# Patient Record
Sex: Female | Born: 1935
Health system: Southern US, Community
[De-identification: ages and names within clinical notes are randomized; demographics above are authoritative.]

## PROBLEM LIST (undated history)

## (undated) ENCOUNTER — Ambulatory Visit: Disposition: A | Discharge status: Home / Self Care | Payer: Medicare PPO

## (undated) ENCOUNTER — Ambulatory Visit: Admission: EM | Payer: Medicare PPO | Source: Home / Self Care

## (undated) DIAGNOSIS — G629 Polyneuropathy, unspecified: Secondary | ICD-10-CM

## (undated) DIAGNOSIS — R109 Unspecified abdominal pain: Secondary | ICD-10-CM

## (undated) DIAGNOSIS — E890 Postprocedural hypothyroidism: Secondary | ICD-10-CM

## (undated) DIAGNOSIS — E119 Type 2 diabetes mellitus without complications: Secondary | ICD-10-CM

## (undated) DIAGNOSIS — M7989 Other specified soft tissue disorders: Secondary | ICD-10-CM

## (undated) DIAGNOSIS — J449 Chronic obstructive pulmonary disease, unspecified: Secondary | ICD-10-CM

## (undated) DIAGNOSIS — F32A Depression, unspecified: Secondary | ICD-10-CM

## (undated) DIAGNOSIS — K5900 Constipation, unspecified: Secondary | ICD-10-CM

## (undated) DIAGNOSIS — I82409 Acute embolism and thrombosis of unspecified deep veins of unspecified lower extremity: Secondary | ICD-10-CM

## (undated) DIAGNOSIS — I10 Essential (primary) hypertension: Secondary | ICD-10-CM

## (undated) DIAGNOSIS — T7840XA Allergy, unspecified, initial encounter: Secondary | ICD-10-CM

## (undated) DIAGNOSIS — H53149 Visual discomfort, unspecified: Secondary | ICD-10-CM

## (undated) DIAGNOSIS — H269 Unspecified cataract: Secondary | ICD-10-CM

## (undated) DIAGNOSIS — K219 Gastro-esophageal reflux disease without esophagitis: Secondary | ICD-10-CM

## (undated) DIAGNOSIS — Z87448 Personal history of other diseases of urinary system: Secondary | ICD-10-CM

## (undated) DIAGNOSIS — E079 Disorder of thyroid, unspecified: Secondary | ICD-10-CM

## (undated) DIAGNOSIS — I2510 Atherosclerotic heart disease of native coronary artery without angina pectoris: Secondary | ICD-10-CM

## (undated) DIAGNOSIS — N3090 Cystitis, unspecified without hematuria: Secondary | ICD-10-CM

## (undated) DIAGNOSIS — N393 Stress incontinence (female) (male): Secondary | ICD-10-CM

## (undated) DIAGNOSIS — H532 Diplopia: Secondary | ICD-10-CM

## (undated) DIAGNOSIS — R499 Unspecified voice and resonance disorder: Secondary | ICD-10-CM

## (undated) DIAGNOSIS — I272 Pulmonary hypertension, unspecified: Secondary | ICD-10-CM

## (undated) DIAGNOSIS — F329 Major depressive disorder, single episode, unspecified: Secondary | ICD-10-CM

## (undated) DIAGNOSIS — R350 Frequency of micturition: Secondary | ICD-10-CM

## (undated) DIAGNOSIS — I2699 Other pulmonary embolism without acute cor pulmonale: Secondary | ICD-10-CM

## (undated) DIAGNOSIS — R10A0 Flank pain, unspecified side: Secondary | ICD-10-CM

## (undated) DIAGNOSIS — H5711 Ocular pain, right eye: Secondary | ICD-10-CM

## (undated) DIAGNOSIS — J45909 Unspecified asthma, uncomplicated: Secondary | ICD-10-CM

## (undated) DIAGNOSIS — J3489 Other specified disorders of nose and nasal sinuses: Secondary | ICD-10-CM

## (undated) DIAGNOSIS — N3946 Mixed incontinence: Secondary | ICD-10-CM

## (undated) DIAGNOSIS — N390 Urinary tract infection, site not specified: Secondary | ICD-10-CM

## (undated) DIAGNOSIS — M8080XA Other osteoporosis with current pathological fracture, unspecified site, initial encounter for fracture: Secondary | ICD-10-CM

## (undated) DIAGNOSIS — K760 Fatty (change of) liver, not elsewhere classified: Secondary | ICD-10-CM

## (undated) DIAGNOSIS — M1990 Unspecified osteoarthritis, unspecified site: Secondary | ICD-10-CM

## (undated) DIAGNOSIS — W19XXXA Unspecified fall, initial encounter: Secondary | ICD-10-CM

## (undated) DIAGNOSIS — G500 Trigeminal neuralgia: Secondary | ICD-10-CM

## (undated) DIAGNOSIS — Z7409 Other reduced mobility: Secondary | ICD-10-CM

## (undated) DIAGNOSIS — H9190 Unspecified hearing loss, unspecified ear: Secondary | ICD-10-CM

## (undated) DIAGNOSIS — C4490 Unspecified malignant neoplasm of skin, unspecified: Secondary | ICD-10-CM

## (undated) DIAGNOSIS — R197 Diarrhea, unspecified: Secondary | ICD-10-CM

## (undated) DIAGNOSIS — D333 Benign neoplasm of cranial nerves: Secondary | ICD-10-CM

## (undated) DIAGNOSIS — Z789 Other specified health status: Secondary | ICD-10-CM

## (undated) DIAGNOSIS — R600 Localized edema: Secondary | ICD-10-CM

## (undated) DIAGNOSIS — S22000A Wedge compression fracture of unspecified thoracic vertebra, initial encounter for closed fracture: Secondary | ICD-10-CM

## (undated) DIAGNOSIS — I519 Heart disease, unspecified: Secondary | ICD-10-CM

## (undated) DIAGNOSIS — G459 Transient cerebral ischemic attack, unspecified: Secondary | ICD-10-CM

## (undated) DIAGNOSIS — E785 Hyperlipidemia, unspecified: Secondary | ICD-10-CM

## (undated) DIAGNOSIS — R32 Unspecified urinary incontinence: Secondary | ICD-10-CM

## (undated) DIAGNOSIS — I639 Cerebral infarction, unspecified: Secondary | ICD-10-CM

## (undated) HISTORY — DX: Unspecified asthma, uncomplicated: J45.909

## (undated) HISTORY — PX: IVC FILTER PLACEMENT (ARMC HX): HXRAD1551

## (undated) HISTORY — DX: Other specified health status: Z78.9

## (undated) HISTORY — DX: Chronic obstructive pulmonary disease, unspecified: J44.9

## (undated) HISTORY — DX: Other specified soft tissue disorders: M79.89

## (undated) HISTORY — PX: PUBOVAGINAL SLING: SHX1035

## (undated) HISTORY — DX: Disorder of thyroid, unspecified: E07.9

## (undated) HISTORY — DX: Urinary tract infection, site not specified: N39.0

## (undated) HISTORY — DX: Unspecified voice and resonance disorder: R49.9

## (undated) HISTORY — PX: OTHER SURGICAL HISTORY: SHX169

## (undated) HISTORY — DX: Essential (primary) hypertension: I10

## (undated) HISTORY — DX: Stress incontinence (female) (male): N39.3

## (undated) HISTORY — PX: BREAST SURGERY: SHX581

## (undated) HISTORY — DX: Depression, unspecified: F32.A

## (undated) HISTORY — DX: Unspecified malignant neoplasm of skin, unspecified: C44.90

## (undated) HISTORY — DX: Visual discomfort, unspecified: H53.149

## (undated) HISTORY — DX: Unspecified abdominal pain: R10.9

## (undated) HISTORY — DX: Hyperlipidemia, unspecified: E78.5

## (undated) HISTORY — PX: THROAT SURGERY: SHX803

## (undated) HISTORY — DX: Acute embolism and thrombosis of unspecified deep veins of unspecified lower extremity: I82.409

## (undated) HISTORY — DX: Heart disease, unspecified: I51.9

## (undated) HISTORY — PX: LAPAROSCOPIC TUBAL LIGATION: SUR803

## (undated) HISTORY — DX: Unspecified osteoarthritis, unspecified site: M19.90

## (undated) HISTORY — DX: Mixed incontinence: N39.46

## (undated) HISTORY — DX: Fatty (change of) liver, not elsewhere classified: K76.0

## (undated) HISTORY — DX: Gastro-esophageal reflux disease without esophagitis: K21.9

## (undated) HISTORY — DX: Transient cerebral ischemic attack, unspecified: G45.9

## (undated) HISTORY — DX: Ocular pain, right eye: H57.11

## (undated) HISTORY — DX: Polyneuropathy, unspecified: G62.9

## (undated) HISTORY — PX: APPENDECTOMY: SHX54

## (undated) HISTORY — DX: Other reduced mobility: Z74.09

## (undated) HISTORY — DX: Other specified disorders of nose and nasal sinuses: J34.89

## (undated) HISTORY — DX: Unspecified hearing loss, unspecified ear: H91.90

## (undated) HISTORY — DX: Diplopia: H53.2

## (undated) HISTORY — PX: CATARACT EXTRACTION: SUR2

## (undated) HISTORY — DX: Type 2 diabetes mellitus without complications: E11.9

## (undated) HISTORY — DX: Cerebral infarction, unspecified: I63.9

## (undated) HISTORY — PX: MOHS SURGERY: SUR867

## (undated) HISTORY — DX: Constipation, unspecified: K59.00

## (undated) HISTORY — PX: EYE SURGERY: SHX253

## (undated) HISTORY — DX: Unspecified fall, initial encounter: W19.XXXA

## (undated) HISTORY — DX: Pulmonary hypertension, unspecified: I27.20

## (undated) HISTORY — DX: Postprocedural hypothyroidism: E89.0

## (undated) HISTORY — DX: Other osteoporosis with current pathological fracture, unspecified site, initial encounter for fracture: M80.80XA

## (undated) HISTORY — DX: Personal history of other diseases of urinary system: Z87.448

## (undated) HISTORY — PX: BRAIN SURGERY: SHX531

## (undated) HISTORY — DX: Frequency of micturition: R35.0

## (undated) HISTORY — DX: Flank pain, unspecified side: R10.A0

## (undated) HISTORY — PX: CHOLECYSTECTOMY: SHX55

## (undated) HISTORY — DX: Wedge compression fracture of unspecified thoracic vertebra, initial encounter for closed fracture: S22.000A

## (undated) HISTORY — DX: Atherosclerotic heart disease of native coronary artery without angina pectoris: I25.10

## (undated) HISTORY — PX: TOOTH EXTRACTION: SUR596

## (undated) HISTORY — PX: THYROID SURGERY: SHX805

## (undated) HISTORY — DX: Other pulmonary embolism without acute cor pulmonale: I26.99

## (undated) HISTORY — DX: Diarrhea, unspecified: R19.7

## (undated) HISTORY — DX: Allergy, unspecified, initial encounter: T78.40XA

## (undated) HISTORY — DX: Cystitis, unspecified without hematuria: N30.90

## (undated) HISTORY — DX: Unspecified cataract: H26.9

## (undated) HISTORY — DX: Unspecified urinary incontinence: R32

## (undated) HISTORY — DX: Benign neoplasm of cranial nerves: D33.3

## (undated) HISTORY — DX: Major depressive disorder, single episode, unspecified: F32.9

## (undated) HISTORY — DX: Localized edema: R60.0

## (undated) HISTORY — DX: Trigeminal neuralgia: G50.0

---

## 1974-12-04 HISTORY — PX: TOTAL THYROIDECTOMY: SHX2547

## 2004-09-26 ENCOUNTER — Encounter: Payer: Self-pay | Admitting: Obstetrics and Gynecology

## 2004-10-04 ENCOUNTER — Encounter: Payer: Self-pay | Admitting: Obstetrics and Gynecology

## 2004-11-14 ENCOUNTER — Ambulatory Visit: Payer: Self-pay | Admitting: Family Medicine

## 2005-01-20 ENCOUNTER — Ambulatory Visit: Payer: Self-pay | Admitting: Internal Medicine

## 2005-02-03 ENCOUNTER — Ambulatory Visit: Payer: Self-pay | Admitting: Otolaryngology

## 2005-02-27 ENCOUNTER — Inpatient Hospital Stay: Payer: Self-pay | Admitting: Internal Medicine

## 2005-03-09 ENCOUNTER — Ambulatory Visit: Payer: Self-pay | Admitting: Otolaryngology

## 2005-06-05 ENCOUNTER — Ambulatory Visit: Payer: Self-pay | Admitting: Internal Medicine

## 2005-06-26 ENCOUNTER — Ambulatory Visit: Payer: Self-pay | Admitting: Internal Medicine

## 2005-07-18 ENCOUNTER — Ambulatory Visit: Payer: Self-pay | Admitting: Otolaryngology

## 2005-07-27 ENCOUNTER — Ambulatory Visit: Payer: Self-pay | Admitting: Otolaryngology

## 2005-08-30 ENCOUNTER — Ambulatory Visit: Payer: Self-pay | Admitting: Internal Medicine

## 2005-11-22 ENCOUNTER — Other Ambulatory Visit: Payer: Self-pay

## 2005-11-22 ENCOUNTER — Ambulatory Visit: Payer: Self-pay | Admitting: Otolaryngology

## 2005-12-01 ENCOUNTER — Ambulatory Visit: Payer: Self-pay | Admitting: Otolaryngology

## 2005-12-14 ENCOUNTER — Ambulatory Visit: Payer: Self-pay | Admitting: Internal Medicine

## 2006-01-22 ENCOUNTER — Encounter: Payer: Self-pay | Admitting: Urology

## 2006-02-01 ENCOUNTER — Encounter: Payer: Self-pay | Admitting: Urology

## 2006-03-04 ENCOUNTER — Encounter: Payer: Self-pay | Admitting: Urology

## 2006-04-03 ENCOUNTER — Encounter: Payer: Self-pay | Admitting: Urology

## 2006-12-27 ENCOUNTER — Ambulatory Visit: Payer: Self-pay | Admitting: Internal Medicine

## 2007-10-18 ENCOUNTER — Ambulatory Visit: Payer: Self-pay | Admitting: Cardiovascular Disease

## 2007-11-12 ENCOUNTER — Ambulatory Visit: Payer: Self-pay | Admitting: Urology

## 2007-12-09 ENCOUNTER — Ambulatory Visit: Payer: Self-pay | Admitting: Internal Medicine

## 2008-01-07 ENCOUNTER — Ambulatory Visit: Payer: Self-pay | Admitting: Internal Medicine

## 2008-06-24 ENCOUNTER — Ambulatory Visit: Payer: Self-pay | Admitting: Ophthalmology

## 2009-01-19 ENCOUNTER — Ambulatory Visit: Payer: Self-pay | Admitting: Internal Medicine

## 2009-05-24 ENCOUNTER — Emergency Department: Payer: Self-pay | Admitting: Emergency Medicine

## 2009-06-26 ENCOUNTER — Inpatient Hospital Stay: Payer: Self-pay | Admitting: Internal Medicine

## 2009-08-13 ENCOUNTER — Emergency Department: Payer: Self-pay | Admitting: Emergency Medicine

## 2009-08-28 ENCOUNTER — Ambulatory Visit: Payer: Self-pay | Admitting: Neurosurgery

## 2010-05-04 ENCOUNTER — Ambulatory Visit: Payer: Self-pay | Admitting: Internal Medicine

## 2010-08-11 ENCOUNTER — Ambulatory Visit: Payer: Self-pay | Admitting: Urology

## 2010-08-23 ENCOUNTER — Ambulatory Visit: Payer: Self-pay | Admitting: Urology

## 2011-09-13 DIAGNOSIS — C44310 Basal cell carcinoma of skin of unspecified parts of face: Secondary | ICD-10-CM | POA: Insufficient documentation

## 2011-10-03 ENCOUNTER — Ambulatory Visit: Payer: Self-pay | Admitting: Internal Medicine

## 2013-02-11 DIAGNOSIS — R351 Nocturia: Secondary | ICD-10-CM | POA: Insufficient documentation

## 2013-02-11 DIAGNOSIS — R339 Retention of urine, unspecified: Secondary | ICD-10-CM | POA: Insufficient documentation

## 2013-02-11 DIAGNOSIS — N3946 Mixed incontinence: Secondary | ICD-10-CM | POA: Insufficient documentation

## 2013-02-11 DIAGNOSIS — R350 Frequency of micturition: Secondary | ICD-10-CM | POA: Insufficient documentation

## 2013-02-11 DIAGNOSIS — N393 Stress incontinence (female) (male): Secondary | ICD-10-CM | POA: Insufficient documentation

## 2013-02-11 DIAGNOSIS — N3642 Intrinsic sphincter deficiency (ISD): Secondary | ICD-10-CM | POA: Insufficient documentation

## 2013-02-11 DIAGNOSIS — N3020 Other chronic cystitis without hematuria: Secondary | ICD-10-CM | POA: Insufficient documentation

## 2013-08-05 DIAGNOSIS — G500 Trigeminal neuralgia: Secondary | ICD-10-CM | POA: Insufficient documentation

## 2014-02-12 ENCOUNTER — Encounter: Payer: Self-pay | Admitting: Podiatry

## 2014-02-13 ENCOUNTER — Ambulatory Visit: Payer: Self-pay | Admitting: Internal Medicine

## 2014-02-16 ENCOUNTER — Ambulatory Visit (INDEPENDENT_AMBULATORY_CARE_PROVIDER_SITE_OTHER): Payer: Medicare PPO | Admitting: Podiatry

## 2014-02-16 ENCOUNTER — Ambulatory Visit (INDEPENDENT_AMBULATORY_CARE_PROVIDER_SITE_OTHER): Payer: Medicare PPO

## 2014-02-16 ENCOUNTER — Encounter: Payer: Self-pay | Admitting: Podiatry

## 2014-02-16 VITALS — BP 137/69 | HR 88 | Resp 26 | Ht 69.0 in | Wt 215.0 lb

## 2014-02-16 DIAGNOSIS — M79672 Pain in left foot: Principal | ICD-10-CM

## 2014-02-16 DIAGNOSIS — M79671 Pain in right foot: Secondary | ICD-10-CM

## 2014-02-16 DIAGNOSIS — B351 Tinea unguium: Secondary | ICD-10-CM

## 2014-02-16 DIAGNOSIS — M2040 Other hammer toe(s) (acquired), unspecified foot: Secondary | ICD-10-CM

## 2014-02-16 DIAGNOSIS — E1149 Type 2 diabetes mellitus with other diabetic neurological complication: Secondary | ICD-10-CM

## 2014-02-16 DIAGNOSIS — M79609 Pain in unspecified limb: Secondary | ICD-10-CM

## 2014-02-16 NOTE — Progress Notes (Signed)
   Subjective:    Patient ID: Jessica Erickson, female    DOB: 06-Jan-1926, 81 y.o.   MRN: [id number]  HPI Comments: The toenails are bothering me , they are thick and i cant cut them. Also callus on my feet.  Diabetic for 5 years. Blood sugar this morning was 145      Review of Systems  HENT:       Sinus problems Hearing loss   Eyes: Positive for itching.  Gastrointestinal: Positive for constipation.  Endocrine:       Diabetic   Hematological: Bruises/bleeds easily.  All other systems reviewed and are negative.       Objective:   Physical Exam: I reviewed her past history medications allergies surgeries and social history. Pulses are palpable bilateral neurologic sensorium is intact bilateral deep tendon reflexes are elicitable muscle strength is 5 over 5 dorsiflexors plantar flexors of his inverters and evertors. All neurovascular status is appears to be intact orthopedic evaluation demonstrates mild hammertoe deformities and HAV deformities bilateral. Cutaneous evaluation demonstrates supple well hydrated cutis nails are thick yellow dystrophic lytic mycotic painful palpation as well as debridement.        Assessment & Plan:  Assessment: Pain in limb secondary to onychomycosis 1 through 5 bilateral.  Plan: Debridement of nails 1 through 5 bilateral is cover service secondary to pain also debrided reactive hyperkeratosis.

## 2014-04-06 DIAGNOSIS — C4441 Basal cell carcinoma of skin of scalp and neck: Secondary | ICD-10-CM | POA: Insufficient documentation

## 2014-05-20 ENCOUNTER — Ambulatory Visit (INDEPENDENT_AMBULATORY_CARE_PROVIDER_SITE_OTHER): Payer: Medicare PPO | Admitting: Podiatry

## 2014-05-20 ENCOUNTER — Encounter: Payer: Self-pay | Admitting: Podiatry

## 2014-05-20 DIAGNOSIS — M79609 Pain in unspecified limb: Secondary | ICD-10-CM

## 2014-05-20 DIAGNOSIS — B351 Tinea unguium: Secondary | ICD-10-CM

## 2014-05-20 NOTE — Progress Notes (Signed)
She presents today with a chief complaint of ingrown toenail N. thick painful nails bilateral.  Objective: Vital signs are stable she is alert and oriented x3. Nails are thick yellow dystrophic with mycotic and painful palpation was sharp incurvated nail margins.  Assessment: Pain in limb secondary to onychomycosis 1 through 5 bilateral.  Plan: Remove nails 1 through 5 bilateral covered service secondary to pain

## 2014-08-19 ENCOUNTER — Ambulatory Visit (INDEPENDENT_AMBULATORY_CARE_PROVIDER_SITE_OTHER): Payer: Medicare PPO | Admitting: Podiatry

## 2014-08-19 ENCOUNTER — Encounter: Payer: Self-pay | Admitting: Podiatry

## 2014-08-19 DIAGNOSIS — M79676 Pain in unspecified toe(s): Secondary | ICD-10-CM

## 2014-08-19 DIAGNOSIS — B351 Tinea unguium: Secondary | ICD-10-CM

## 2014-08-19 DIAGNOSIS — M79609 Pain in unspecified limb: Secondary | ICD-10-CM

## 2014-08-19 NOTE — Progress Notes (Signed)
She presents today with a chief complaint of painful elongated toenails one through 5 bilateral.  Objective: pulses are palpable bilateral. Nails are thick yellow dystrophic clinically mycotic and painful palpation.  Assessment: Pain in limb secondary to onychomycosis 1 through 5 bilateral.  Plan: Pain in limb secondary to onychomycosis 1 through 5 bilateral.

## 2014-11-18 ENCOUNTER — Ambulatory Visit: Payer: Medicare PPO | Admitting: Podiatry

## 2014-12-09 ENCOUNTER — Ambulatory Visit: Payer: Medicare PPO | Admitting: Podiatry

## 2014-12-21 ENCOUNTER — Ambulatory Visit (INDEPENDENT_AMBULATORY_CARE_PROVIDER_SITE_OTHER): Payer: Medicare PPO | Admitting: Podiatry

## 2014-12-21 DIAGNOSIS — M79676 Pain in unspecified toe(s): Secondary | ICD-10-CM

## 2014-12-21 DIAGNOSIS — B351 Tinea unguium: Secondary | ICD-10-CM

## 2014-12-21 NOTE — Progress Notes (Signed)
She presents today with a chief complaint of painful elongated toenails one through 5 bilateral.  Objective: pulses are palpable bilateral. Nails are thick yellow dystrophic clinically mycotic and painful palpation.  Assessment: Pain in limb secondary to onychomycosis 1 through 5 bilateral.  Plan: Pain in limb secondary to onychomycosis 1 through 5 bilateral. R hallux pinch callus also reduced.

## 2015-03-22 ENCOUNTER — Ambulatory Visit: Payer: Medicare PPO

## 2015-03-24 ENCOUNTER — Ambulatory Visit (INDEPENDENT_AMBULATORY_CARE_PROVIDER_SITE_OTHER): Payer: Medicare PPO | Admitting: Podiatry

## 2015-03-24 ENCOUNTER — Encounter: Payer: Self-pay | Admitting: Podiatry

## 2015-03-24 DIAGNOSIS — M79676 Pain in unspecified toe(s): Secondary | ICD-10-CM | POA: Diagnosis not present

## 2015-03-24 DIAGNOSIS — B351 Tinea unguium: Secondary | ICD-10-CM | POA: Diagnosis not present

## 2015-03-24 NOTE — Progress Notes (Signed)
She presents today with a chief complaint of painful elongated toenails one through 5 bilateral.  Objective: pulses are palpable bilateral. Nails are thick yellow dystrophic clinically mycotic and painful palpation.  Assessment: Pain in limb secondary to onychomycosis 1 through 5 bilateral.  Plan: Pain in limb secondary to onychomycosis 1 through 5 bilateral. R hallux pinch callus also reduced.

## 2015-03-24 NOTE — Patient Instructions (Signed)
Diabetes and Foot Care Diabetes may cause you to have problems because of poor blood supply (circulation) to your feet and legs. This may cause the skin on your feet to become thinner, break easier, and heal more slowly. Your skin may become dry, and the skin may peel and crack. You may also have nerve damage in your legs and feet causing decreased feeling in them. You may not notice minor injuries to your feet that could lead to infections or more serious problems. Taking care of your feet is one of the most important things you can do for yourself.  HOME CARE INSTRUCTIONS  Wear shoes at all times, even in the house. Do not go barefoot. Bare feet are easily injured.  Check your feet daily for blisters, cuts, and redness. If you cannot see the bottom of your feet, use a mirror or ask someone for help.  Wash your feet with warm water (do not use hot water) and mild soap. Then pat your feet and the areas between your toes until they are completely dry. Do not soak your feet as this can dry your skin.  Apply a moisturizing lotion or petroleum jelly (that does not contain alcohol and is unscented) to the skin on your feet and to dry, brittle toenails. Do not apply lotion between your toes.  Trim your toenails straight across. Do not dig under them or around the cuticle. File the edges of your nails with an emery board or nail file.  Do not cut corns or calluses or try to remove them with medicine.  Wear clean socks or stockings every day. Make sure they are not too tight. Do not wear knee-high stockings since they may decrease blood flow to your legs.  Wear shoes that fit properly and have enough cushioning. To break in new shoes, wear them for just a few hours a day. This prevents you from injuring your feet. Always look in your shoes before you put them on to be sure there are no objects inside.  Do not cross your legs. This may decrease the blood flow to your feet.  If you find a minor scrape,  cut, or break in the skin on your feet, keep it and the skin around it clean and dry. These areas may be cleansed with mild soap and water. Do not cleanse the area with peroxide, alcohol, or iodine.  When you remove an adhesive bandage, be sure not to damage the skin around it.  If you have a wound, look at it several times a day to make sure it is healing.  Do not use heating pads or hot water bottles. They may burn your skin. If you have lost feeling in your feet or legs, you may not know it is happening until it is too late.  Make sure your health care provider performs a complete foot exam at least annually or more often if you have foot problems. Report any cuts, sores, or bruises to your health care provider immediately. SEEK MEDICAL CARE IF:   You have an injury that is not healing.  You have cuts or breaks in the skin.  You have an ingrown nail.  You notice redness on your legs or feet.  You feel burning or tingling in your legs or feet.  You have pain or cramps in your legs and feet.  Your legs or feet are numb.  Your feet always feel cold. SEEK IMMEDIATE MEDICAL CARE IF:   There is increasing redness,   swelling, or pain in or around a wound.  There is a red line that goes up your leg.  Pus is coming from a wound.  You develop a fever or as directed by your health care provider.  You notice a bad smell coming from an ulcer or wound. Document Released: 11/17/2000 Document Revised: 07/23/2013 Document Reviewed: 04/29/2013 ExitCare Patient Information 2015 [employer]. This information is not intended to replace advice given to you by your health care provider. Make sure you discuss any questions you have with your health care provider.  

## 2015-07-12 ENCOUNTER — Telehealth: Payer: Self-pay | Admitting: *Deleted

## 2015-07-12 NOTE — Telephone Encounter (Signed)
Pt states she would like to change her appt.

## 2015-07-19 ENCOUNTER — Ambulatory Visit: Payer: Medicare PPO

## 2015-08-23 ENCOUNTER — Ambulatory Visit (INDEPENDENT_AMBULATORY_CARE_PROVIDER_SITE_OTHER): Payer: Medicare PPO | Admitting: Podiatry

## 2015-08-23 DIAGNOSIS — B351 Tinea unguium: Secondary | ICD-10-CM

## 2015-08-23 DIAGNOSIS — M79676 Pain in unspecified toe(s): Secondary | ICD-10-CM | POA: Diagnosis not present

## 2015-08-23 NOTE — Progress Notes (Signed)
She presents today with a chief complaint of painful elongated toenails one through 5 bilateral.  Objective: pulses are palpable bilateral. Nails are thick yellow dystrophic clinically mycotic and painful palpation.  Assessment: Pain in limb secondary to onychomycosis 1 through 5 bilateral.  Plan: Pain in limb secondary to onychomycosis 1 through 5 bilateral. R hallux pinch callus also reduced.  Roselind Messier DPM

## 2015-09-06 ENCOUNTER — Ambulatory Visit: Payer: Medicare PPO

## 2015-09-15 ENCOUNTER — Emergency Department: Payer: Medicare PPO

## 2015-09-15 ENCOUNTER — Encounter: Payer: Self-pay | Admitting: *Deleted

## 2015-09-15 ENCOUNTER — Emergency Department
Admission: EM | Admit: 2015-09-15 | Discharge: 2015-09-15 | Disposition: A | Discharge status: Home / Self Care | Payer: Medicare PPO | Source: Home / Self Care | Attending: Emergency Medicine | Admitting: Emergency Medicine

## 2015-09-15 ENCOUNTER — Inpatient Hospital Stay
Admission: EM | Admit: 2015-09-15 | Discharge: 2015-09-18 | DRG: 392 | Payer: Medicare PPO | Attending: Internal Medicine | Admitting: Internal Medicine

## 2015-09-15 DIAGNOSIS — J45909 Unspecified asthma, uncomplicated: Secondary | ICD-10-CM | POA: Diagnosis present

## 2015-09-15 DIAGNOSIS — E039 Hypothyroidism, unspecified: Secondary | ICD-10-CM | POA: Diagnosis present

## 2015-09-15 DIAGNOSIS — R0902 Hypoxemia: Secondary | ICD-10-CM | POA: Diagnosis present

## 2015-09-15 DIAGNOSIS — W19XXXA Unspecified fall, initial encounter: Secondary | ICD-10-CM | POA: Diagnosis present

## 2015-09-15 DIAGNOSIS — R112 Nausea with vomiting, unspecified: Secondary | ICD-10-CM | POA: Diagnosis not present

## 2015-09-15 DIAGNOSIS — Z9889 Other specified postprocedural states: Secondary | ICD-10-CM

## 2015-09-15 DIAGNOSIS — Z880 Allergy status to penicillin: Secondary | ICD-10-CM

## 2015-09-15 DIAGNOSIS — Z87891 Personal history of nicotine dependence: Secondary | ICD-10-CM

## 2015-09-15 DIAGNOSIS — R2681 Unsteadiness on feet: Secondary | ICD-10-CM | POA: Diagnosis present

## 2015-09-15 DIAGNOSIS — I10 Essential (primary) hypertension: Secondary | ICD-10-CM | POA: Diagnosis present

## 2015-09-15 DIAGNOSIS — R52 Pain, unspecified: Secondary | ICD-10-CM

## 2015-09-15 DIAGNOSIS — T421X5A Adverse effect of iminostilbenes, initial encounter: Secondary | ICD-10-CM | POA: Diagnosis present

## 2015-09-15 DIAGNOSIS — Z833 Family history of diabetes mellitus: Secondary | ICD-10-CM

## 2015-09-15 DIAGNOSIS — R609 Edema, unspecified: Secondary | ICD-10-CM | POA: Diagnosis present

## 2015-09-15 DIAGNOSIS — N950 Postmenopausal bleeding: Secondary | ICD-10-CM | POA: Diagnosis present

## 2015-09-15 DIAGNOSIS — E1140 Type 2 diabetes mellitus with diabetic neuropathy, unspecified: Secondary | ICD-10-CM | POA: Diagnosis present

## 2015-09-15 DIAGNOSIS — Z79899 Other long term (current) drug therapy: Secondary | ICD-10-CM

## 2015-09-15 DIAGNOSIS — Z888 Allergy status to other drugs, medicaments and biological substances status: Secondary | ICD-10-CM

## 2015-09-15 DIAGNOSIS — K8689 Other specified diseases of pancreas: Secondary | ICD-10-CM | POA: Diagnosis present

## 2015-09-15 DIAGNOSIS — R531 Weakness: Secondary | ICD-10-CM

## 2015-09-15 DIAGNOSIS — Z882 Allergy status to sulfonamides status: Secondary | ICD-10-CM

## 2015-09-15 DIAGNOSIS — M545 Low back pain: Secondary | ICD-10-CM | POA: Diagnosis present

## 2015-09-15 DIAGNOSIS — J449 Chronic obstructive pulmonary disease, unspecified: Secondary | ICD-10-CM | POA: Diagnosis present

## 2015-09-15 DIAGNOSIS — T889XXA Complication of surgical and medical care, unspecified, initial encounter: Secondary | ICD-10-CM

## 2015-09-15 DIAGNOSIS — G500 Trigeminal neuralgia: Secondary | ICD-10-CM | POA: Diagnosis present

## 2015-09-15 DIAGNOSIS — M549 Dorsalgia, unspecified: Secondary | ICD-10-CM

## 2015-09-15 DIAGNOSIS — K5900 Constipation, unspecified: Secondary | ICD-10-CM | POA: Diagnosis present

## 2015-09-15 DIAGNOSIS — Z9049 Acquired absence of other specified parts of digestive tract: Secondary | ICD-10-CM

## 2015-09-15 DIAGNOSIS — E785 Hyperlipidemia, unspecified: Secondary | ICD-10-CM | POA: Diagnosis present

## 2015-09-15 DIAGNOSIS — Z7982 Long term (current) use of aspirin: Secondary | ICD-10-CM

## 2015-09-15 DIAGNOSIS — R1110 Vomiting, unspecified: Secondary | ICD-10-CM | POA: Diagnosis present

## 2015-09-15 DIAGNOSIS — M1990 Unspecified osteoarthritis, unspecified site: Secondary | ICD-10-CM | POA: Diagnosis present

## 2015-09-15 LAB — COMPREHENSIVE METABOLIC PANEL
ALT: 28 U/L (ref 14–54)
AST: 35 U/L (ref 15–41)
Albumin: 3.9 g/dL (ref 3.5–5.0)
Alkaline Phosphatase: 64 U/L (ref 38–126)
Anion gap: 8 (ref 5–15)
BUN: 16 mg/dL (ref 6–20)
CO2: 28 mmol/L (ref 22–32)
Calcium: 8.9 mg/dL (ref 8.9–10.3)
Chloride: 104 mmol/L (ref 101–111)
Creatinine, Ser: 0.77 mg/dL (ref 0.44–1.00)
GFR calc Af Amer: >60 mL/min (ref >60)
GFR calc non Af Amer: >60 mL/min (ref >60)
Glucose, Bld: 148 mg/dL — ABNORMAL HIGH (ref 65–99)
Potassium: 4.4 mmol/L (ref 3.5–5.1)
Sodium: 140 mmol/L (ref 135–145)
Total Bilirubin: 0.3 mg/dL (ref 0.3–1.2)
Total Protein: 7.1 g/dL (ref 6.5–8.1)

## 2015-09-15 LAB — CBC WITH DIFFERENTIAL/PLATELET
Basophils Absolute: 0 10*3/uL (ref 0–0.1)
Basophils Absolute: 0 10*3/uL (ref 0–0.1)
Basophils Relative: 0 %
Basophils Relative: 0 %
Eosinophils Absolute: 0.1 10*3/uL (ref 0–0.7)
Eosinophils Absolute: 0 10*3/uL (ref 0–0.7)
Eosinophils Relative: 1 %
Eosinophils Relative: 0 %
HCT: 40.8 % (ref 35.0–47.0)
HCT: 41.5 % (ref 35.0–47.0)
Hemoglobin: 13.2 g/dL (ref 12.0–16.0)
Hemoglobin: 13.7 g/dL (ref 12.0–16.0)
Lymphocytes Relative: 11 %
Lymphocytes Relative: 13 %
Lymphs Abs: 1.3 10*3/uL (ref 1.0–3.6)
Lymphs Abs: 1.6 10*3/uL (ref 1.0–3.6)
MCH: 30.2 pg (ref 26.0–34.0)
MCH: 30.8 pg (ref 26.0–34.0)
MCHC: 32.4 g/dL (ref 32.0–36.0)
MCHC: 33 g/dL (ref 32.0–36.0)
MCV: 93.2 fL (ref 80.0–100.0)
MCV: 93.4 fL (ref 80.0–100.0)
Monocytes Absolute: 0.7 10*3/uL (ref 0.2–0.9)
Monocytes Absolute: 0.8 10*3/uL (ref 0.2–0.9)
Monocytes Relative: 6 %
Monocytes Relative: 6 %
Neutro Abs: 9.2 10*3/uL — ABNORMAL HIGH (ref 1.4–6.5)
Neutro Abs: 10 10*3/uL — ABNORMAL HIGH (ref 1.4–6.5)
Neutrophils Relative %: 82 %
Neutrophils Relative %: 81 %
Platelets: 208 10*3/uL (ref 150–440)
Platelets: 227 10*3/uL (ref 150–440)
RBC: 4.38 MIL/uL (ref 3.80–5.20)
RBC: 4.44 MIL/uL (ref 3.80–5.20)
RDW: 14 % (ref 11.5–14.5)
RDW: 14.2 % (ref 11.5–14.5)
WBC: 11.3 10*3/uL — ABNORMAL HIGH (ref 3.6–11.0)
WBC: 12.4 10*3/uL — ABNORMAL HIGH (ref 3.6–11.0)

## 2015-09-15 LAB — BASIC METABOLIC PANEL
Anion gap: 5 (ref 5–15)
BUN: 17 mg/dL (ref 6–20)
CO2: 29 mmol/L (ref 22–32)
Calcium: 8.7 mg/dL — ABNORMAL LOW (ref 8.9–10.3)
Chloride: 106 mmol/L (ref 101–111)
Creatinine, Ser: 0.87 mg/dL (ref 0.44–1.00)
GFR calc Af Amer: >60 mL/min (ref >60)
GFR calc non Af Amer: 58 mL/min — ABNORMAL LOW (ref >60)
Glucose, Bld: 121 mg/dL — ABNORMAL HIGH (ref 65–99)
Potassium: 4.2 mmol/L (ref 3.5–5.1)
Sodium: 140 mmol/L (ref 135–145)

## 2015-09-15 LAB — URINALYSIS COMPLETE WITH MICROSCOPIC (ARMC ONLY)
Bilirubin Urine: NEGATIVE
Glucose, UA: NEGATIVE mg/dL
Hgb urine dipstick: NEGATIVE
Ketones, ur: NEGATIVE mg/dL
Leukocytes, UA: NEGATIVE
Nitrite: NEGATIVE
Protein, ur: NEGATIVE mg/dL
Specific Gravity, Urine: 1.018 (ref 1.005–1.030)
pH: 5 (ref 5.0–8.0)

## 2015-09-15 MED ORDER — ONDANSETRON HCL 4 MG/2ML IJ SOLN
4.0000 mg | Freq: Once | INTRAMUSCULAR | Status: AC
  Administered 2015-09-15: 4 mg via INTRAVENOUS
  Filled 2015-09-15: qty 2

## 2015-09-15 MED ORDER — OXYCODONE HCL 5 MG PO TABS: 2.5000 mg | ORAL_TABLET | Freq: Four times a day (QID) | ORAL | Status: DC | PRN

## 2015-09-15 MED ORDER — ACETAMINOPHEN 325 MG PO TABS
650.0000 mg | ORAL_TABLET | Freq: Once | ORAL | Status: AC
  Administered 2015-09-15: 650 mg via ORAL
  Filled 2015-09-15: qty 2

## 2015-09-15 MED ORDER — SODIUM CHLORIDE 0.9 % IV BOLUS (SEPSIS)
1000.0000 mL | Freq: Once | INTRAVENOUS | Status: AC
  Administered 2015-09-15: 1000 mL via INTRAVENOUS

## 2015-09-15 MED ORDER — ONDANSETRON HCL 4 MG/2ML IJ SOLN
4.0000 mg | Freq: Once | INTRAMUSCULAR | Status: AC
  Administered 2015-09-16: 4 mg via INTRAVENOUS
  Filled 2015-09-15: qty 2

## 2015-09-15 MED ORDER — IOHEXOL 240 MG/ML SOLN
25.0000 mL | Freq: Once | INTRAMUSCULAR | Status: AC | PRN
  Administered 2015-09-15: 25 mL via ORAL

## 2015-09-15 MED ORDER — ACETAMINOPHEN 325 MG PO TABS: 650.0000 mg | ORAL_TABLET | Freq: Four times a day (QID) | ORAL | Status: DC | PRN

## 2015-09-15 MED ORDER — IOHEXOL 300 MG/ML  SOLN
100.0000 mL | Freq: Once | INTRAMUSCULAR | Status: AC | PRN
  Administered 2015-09-15: 100 mL via INTRAVENOUS

## 2015-09-15 MED ORDER — MORPHINE SULFATE (PF) 4 MG/ML IV SOLN
4.0000 mg | Freq: Once | INTRAVENOUS | Status: AC
  Administered 2015-09-15: 4 mg via INTRAVENOUS
  Filled 2015-09-15: qty 1

## 2015-09-15 NOTE — Care Management Note (Signed)
Case Management Note  Patient Details  Name: Jessica Erickson MRN: [id number] Date of Birth: 1926-08-18  Subjective/Objective:    SPoke to Vinnie Level at [employer] [facility]- for PT referral.[phone]. Information for caresouth on discharge chart. Told pt. To expect a call from them. They say they can start tomorrow. Fax [phone]  Dr. Joni Fears made aware.               Action/Plan:   Expected Discharge Date:                  Expected Discharge Plan:     In-House Referral:     Discharge planning Services     Post Acute Care Choice:    Choice offered to:     DME Arranged:    DME Agency:     HH Arranged:    [place] Agency:     Status of Service:     Medicare Important Message Given:    Date Medicare IM Given:    Medicare IM give by:    Date Additional Medicare IM Given:    Additional Medicare Important Message give by:     If discussed at [place] of Stay Meetings, dates discussed:    Additional Comments:  Beau Fanny, RN 09/15/2015, 1:52 PM

## 2015-09-15 NOTE — ED Provider Notes (Signed)
-----------------------------------------   11:48 PM on 09/15/2015 -----------------------------------------  CT scan IMPRESSION: Dilated intra and extrahepatic bile ducts as well as pancreatic duct. This may be physiologic but correlation with liver enzymes is suggested. If positive, consider follow-up with elective MRI/ MRCP. Mild right renal hydronephrosis and hydroureter to the mid ureteral region. No obstructing stone or lesion identified but occult stone or stricture not excluded. Redundant stool-filled colon suggesting constipation. Calcification in the bladder neck may be a stone or dystrophic calcification.  Patient's LFTs are normal.will however add on a lipase. Additionally will attempt to walk the patient with a walker and get an O2 sat. Patient continues to have nausea. Will write for another dose of Zofran.  Nance Pear, MD 09/16/15 [phone]

## 2015-09-15 NOTE — ED Notes (Signed)
Pt fell this AM, c/o left posterior rib pain, was able to get herself back into bed, son called EMS.. Denies LOC.

## 2015-09-15 NOTE — ED Notes (Signed)
Patient transported to CT via stretcher.

## 2015-09-15 NOTE — ED Notes (Signed)
Pt was seen here early this morning from a fall and was discharged home.  Pt has vomiting since discharge.   Pt brought in by son today.  Pt reports she hurts all over.  Pt pale.

## 2015-09-15 NOTE — ED Notes (Signed)
MD at bedside. 

## 2015-09-15 NOTE — Discharge Instructions (Signed)
Take Tylenol every 6 hours for the next 5 days. In addition, if you have inadequate pain control, take oxycodone 2.5 mg as needed. Do not take this medicine more often than every 4 hours. We have arranged for home physical therapy who will come to evaluate you tomorrow.  Follow-up with your primary care doctor and interventional radiology regarding other ways of managing your trigeminal neuralgia. You appear to be having side effects from your increasing doses of carbamazepine which is causing you to be weak and fall down and at high risk for serious injury.  Neuropathic Pain Neuropathic pain is pain caused by damage to the nerves that are responsible for certain sensations in your body (sensory nerves). The pain can be caused by damage to:   The sensory nerves that send signals to your spinal cord and brain (peripheral nervous system).  The sensory nerves in your brain or spinal cord (central nervous system). Neuropathic pain can make you more sensitive to pain. What would be a minor sensation for most people may feel very painful if you have neuropathic pain. This is usually a long-term condition that can be difficult to treat. The type of pain can differ from person to person. It may start suddenly (acute), or it may develop slowly and last for a long time (chronic). Neuropathic pain may come and go as damaged nerves heal or may stay at the same level for years. It often causes emotional distress, loss of sleep, and a lower quality of life. CAUSES  The most common cause of damage to a sensory nerve is diabetes. Many other diseases and conditions can also cause neuropathic pain. Causes of neuropathic pain can be classified as:  Toxic. Many drugs and chemicals can cause toxic damage. The most common cause of toxic neuropathic pain is damage from drug treatment for cancer (chemotherapy).  Metabolic. This type of pain can happen when a disease causes imbalances that damage nerves. Diabetes is the most  common of these diseases. Vitamin B deficiency caused by long-term alcohol abuse is another common cause.  Traumatic. Any injury that cuts, crushes, or stretches a nerve can cause damage and pain. A common example is feeling pain after losing an arm or leg (phantom limb pain).  Compression-related. If a sensory nerve gets trapped or compressed for a long period of time, the blood supply to the nerve can be cut off.  Vascular. Many blood vessel diseases can cause neuropathic pain by decreasing blood supply and oxygen to nerves.  Autoimmune. This type of pain results from diseases in which the body's defense system mistakenly attacks sensory nerves. Examples of autoimmune diseases that can cause neuropathic pain include lupus and multiple sclerosis.  Infectious. Many types of viral infections can damage sensory nerves and cause pain. Shingles infection is a common cause of this type of pain.  Inherited. Neuropathic pain can be a symptom of many diseases that are passed down through families (genetic). SIGNS AND SYMPTOMS  The main symptom is pain. Neuropathic pain is often described as:  Burning.  Shock-like.  Stinging.  Hot or cold.  Itching. DIAGNOSIS  No single test can diagnose neuropathic pain. Your health care provider will do a physical exam and ask you about your pain. You may use a pain scale to describe how bad your pain is. You may also have tests to see if you have a high sensitivity to pain and to help find the cause and location of any sensory nerve damage. These tests may include:  Imaging studies, such as:  X-rays.  CT scan.  MRI.  Nerve conduction studies to test how well nerve signals travel through your sensory nerves (electrodiagnostic testing).  Stimulating your sensory nerves through electrodes on your skin and measuring the response in your spinal cord and brain (somatosensory evoked potentials). TREATMENT  Treatment for neuropathic pain may change over  time. You may need to try different treatment options or a combination of treatments. Some options include:  Over-the-counter pain relievers.  Prescription medicines. Some medicines used to treat other conditions may also help neuropathic pain. These include medicines to:  Control seizures (anticonvulsants).  Relieve depression (antidepressants).  Prescription-strength pain relievers (narcotics). These are usually used when other pain relievers do not help.  Transcutaneous nerve stimulation (TENS). This uses electrical currents to block painful nerve signals. The treatment is painless.  Topical and local anesthetics. These are medicines that numb the nerves. They can be injected as a nerve block or applied to the skin.  Alternative treatments, such as:  Acupuncture.  Meditation.  Massage.  Physical therapy.  Pain management programs.  Counseling. HOME CARE INSTRUCTIONS  Learn as much as you can about your condition.  Take medicines only as directed by your health care provider.  Work closely with all your health care providers to find what works best for you.  Have a good support system at home.  Consider joining a chronic pain support group. SEEK MEDICAL CARE IF:  Your pain treatments are not helping.  You are having side effects from your medicines.  You are struggling with fatigue, mood changes, depression, or anxiety.   This information is not intended to replace advice given to you by your health care provider. Make sure you discuss any questions you have with your health care provider.   Document Released: 08/17/2004 Document Revised: 12/11/2014 Document Reviewed: 04/30/2014 Elsevier Interactive Patient Education [employer].

## 2015-09-15 NOTE — ED Notes (Signed)
Spoke with Reita Cliche, MD regarding presenting c/o. MD with [facility]ORB for: PIV, Zofran 4mg  IVP, Morphine 4mg  IVP, NS x 1 liter bolus, and a contrast CT of abd/pelvis. Orders to be entered and carried by nursing staff.

## 2015-09-15 NOTE — ED Provider Notes (Signed)
[facility] Emergency Department Provider Note  ____________________________________________  Time seen: 11:05 AM on arrival by EMS  I have reviewed the triage vital signs and the nursing notes.   HISTORY  Chief Complaint Fall    HPI MABREY QUARANTA is a 81 y.o. female brought to the ED by EMS after a fall. She was trying to get out of bed when she felt very weak and suddenly felt like her legs gave out and so she fell down to the ground. It is more of a slide onto her back, and she currently denies any acute pain. No pain in the hips or legs. She denies any chest pain or shortness of breath, no abdominal pain headache or other acute symptoms.  She has a history of trigeminal neuralgia and takes Tegretol for prevention. Recently she is been also having some other there is headache symptoms and has been anxious that her trigeminal neuralgia would come back, so she's been increasing her Tegretol doses. In discussion with her son this appears to be associated with increased weakness and difficulty with ambulation in the house. She's lost 60 pounds over the past year due to decreased exercise and decreased activity.     Past Medical History  Diagnosis Date  . History of kidney problems   . Diabetes ([place])   . Asthma   . Osteoarthritis   . Bilateral swelling of feet     and legs  . Neuropathy ([place])      There are no active problems to display for this patient.    Past Surgical History  Procedure Laterality Date  . Brain tumor surgery    . Thyroid surgery    . Throat surgery    . Cholecystectomy       Current Outpatient Rx  Name  Route  Sig  Dispense  Refill  . acetaminophen (TYLENOL) 325 MG tablet   Oral   Take 2 tablets (650 mg total) by mouth every 6 (six) hours as needed.   60 tablet   0   . ASPIRIN LOW DOSE 81 MG EC tablet               . atorvastatin (LIPITOR) 40 MG tablet   Oral   Take 40 mg by mouth daily.         Marland Kitchen CALCIUM  CITRATE PO   Oral   Take by mouth.         . carbamazepine (TEGRETOL XR) 200 MG 12 hr tablet   Oral   Take 200 mg by mouth 3 (three) times daily.         . cloNIDine (CATAPRES) 0.1 MG tablet   Oral   Take 0.1 mg by mouth.         . Diphenhydramine-APAP, sleep, (TYLENOL PM EXTRA STRENGTH PO)   Oral   Take by mouth at bedtime.         . DULoxetine (CYMBALTA) 30 MG capsule   Oral   Take 30 mg by mouth daily.         . Esomeprazole Magnesium (NEXIUM PO)   Oral   Take by mouth 2 (two) times daily.         . fosinopril (MONOPRIL) 10 MG tablet   Oral   Take 10 mg by mouth daily.         Marland Kitchen levothyroxine (LEVOXYL) 150 MCG tablet   Oral   Take 150 mcg by mouth daily before breakfast.         .  metFORMIN (GLUCOPHAGE) 500 MG tablet   Oral   Take by mouth 2 (two) times daily with a meal.         . Multiple Vitamin (MULTIVITAMIN) tablet   Oral   Take 1 tablet by mouth daily.         . nitrofurantoin, macrocrystal-monohydrate, (MACROBID) 100 MG capsule   Oral   Take 100 mg by mouth at bedtime.         Marland Kitchen omeprazole (PRILOSEC) 40 MG capsule               . oxyCODONE (ROXICODONE) 5 MG immediate release tablet   Oral   Take 0.5 tablets (2.5 mg total) by mouth every 6 (six) hours as needed for breakthrough pain.   8 tablet   0   . venlafaxine (EFFEXOR) 75 MG tablet                  Allergies Aspirin; Penicillins; and Sulfa antibiotics   Family History  Problem Relation Age of Onset  . Diabetes Mother     Social History Social History  Substance Use Topics  . Smoking status: Never Smoker   . Smokeless tobacco: Never Used  . Alcohol Use: No    Review of Systems  Constitutional:   No fever or chills. No weight changes Eyes:   No blurry vision or double vision.  ENT:   No sore throat. Cardiovascular:   No chest pain. Respiratory:   No dyspnea or cough. Gastrointestinal:   Negative for abdominal pain, vomiting and diarrhea.  No  BRBPR or melena. Genitourinary:   Negative for dysuria, urinary retention, bloody urine, or difficulty urinating. Musculoskeletal:   Chronic back pain Skin:   Negative for rash. Neurological:   Negative for headaches, focal weakness or numbness. Chronic peripheral neuropathy Psychiatric:  No anxiety or depression.   Endocrine:  No hot/cold intolerance, changes in energy, or sleep difficulty.  10-point ROS otherwise negative.  ____________________________________________   PHYSICAL EXAM:  VITAL SIGNS: ED Triage Vitals  Enc Vitals Group     BP 09/15/15 1108 102/60 mmHg     Pulse Rate 09/15/15 1108 70     Resp 09/15/15 1113 18     Temp 09/15/15 1113 97.6 F (36.4 C)     Temp Source 09/15/15 1113 Oral     SpO2 09/15/15 1108 97 %     Weight 09/15/15 1108 180 lb (81.647 kg)     Height 09/15/15 1108 5\' 6"  (1.676 m)     Head Cir --      Peak Flow --      Pain Score 09/15/15 1110 5     Pain Loc --      Pain Edu? --      Excl. in [place]? --      Constitutional:   Alert and oriented. Well appearing and in no distress. Eyes:   No scleral icterus. No conjunctival pallor. PERRL. EOMI ENT   Head:   Normocephalic and atraumatic.   Nose:   No congestion/rhinnorhea. No septal hematoma   Mouth/Throat:   MMM, no pharyngeal erythema. No peritonsillar mass. No uvula shift.   Neck:   No stridor. No SubQ emphysema. No meningismus. Hematological/Lymphatic/Immunilogical:   No cervical lymphadenopathy. Cardiovascular:   RRR. Normal and symmetric distal pulses are present in all extremities. No murmurs, rubs, or gallops. Respiratory:   Normal respiratory effort without tachypnea nor retractions. Breath sounds are clear and equal bilaterally. No wheezes/rales/rhonchi. Gastrointestinal:   Soft and  nontender. No distention. There is no CVA tenderness.  No rebound, rigidity, or guarding. Genitourinary:   deferred Musculoskeletal:   Nontender with normal range of motion in all extremities.  No joint effusions.  No lower extremity tenderness.  No edema. Neurologic:   Normal speech and language.  CN 2-10 normal. Motor grossly intact. No gross focal neurologic deficits are appreciated.  Skin:    Skin is warm, dry and intact. No rash noted.  No petechiae, purpura, or bullae. Psychiatric:   Mood and affect are normal. Speech and behavior are normal. Patient exhibits appropriate insight and judgment.  ____________________________________________    LABS (pertinent positives/negatives) (all labs ordered are listed, but only abnormal results are displayed) Labs Reviewed  URINALYSIS COMPLETEWITH MICROSCOPIC ([facility] ONLY) - Abnormal; Notable for the following:    Color, Urine YELLOW (*)    APPearance CLEAR (*)    Bacteria, UA RARE (*)    Squamous Epithelial / LPF 0-5 (*)    All other components within normal limits  BASIC METABOLIC PANEL - Abnormal; Notable for the following:    Glucose, Bld 121 (*)    Calcium 8.7 (*)    GFR calc non Af Amer 58 (*)    All other components within normal limits  CBC WITH DIFFERENTIAL/PLATELET - Abnormal; Notable for the following:    WBC 11.3 (*)    Neutro Abs 9.2 (*)    All other components within normal limits   ____________________________________________   EKG  Interpreted by me Normal sinus rhythm rate of 65, normal axis and intervals. Poor R-wave progression in anterior precordial leads. Normal ST segments and T waves  ____________________________________________    RADIOLOGY  Chest x-ray unremarkable  ____________________________________________   PROCEDURES   ____________________________________________   INITIAL IMPRESSION / ASSESSMENT AND PLAN / ED COURSE  Pertinent labs & imaging results that were available during my care of the patient were reviewed by me and considered in my medical decision making (see chart for details).  Labs urinalysis chest x-ray EKG all unremarkable. Physical exam does not reveal any  concern for any acute traumatic injury. This appears to be generalized weakness due to Tegretol overuse and side effects, resulting in some deconditioning as well. We arranged for home physical therapy evaluation. The patient does have a very intelligent and involved family who can help with her care as well, and she has an exercise bike that she is used to using at home although she is decrease it recently. I encouraged her to increase her exercise as tolerated. Decrease the Tegretol from 3 times a day to 2 times a day only, and follow up with her interventional radiologist for consideration of further gamma knife treatments. I discussed with her and her son at length for about 10 minutes the need to find the right balance between suppression of her trigeminal neuralgia with Tegretol and avoiding side effects that are going to put her at high risk for serious injury     ____________________________________________   FINAL CLINICAL IMPRESSION(S) / ED DIAGNOSES  Final diagnoses:  Side effects of treatment, initial encounter   generalized weakness    Carrie Mew, MD 09/15/15 1420

## 2015-09-15 NOTE — ED Provider Notes (Signed)
[facility] Emergency Department Provider Note   ____________________________________________  Time seen:  I have reviewed the triage vital signs and the triage nursing note.  HISTORY  Chief Complaint Emesis   Historian Patient, son  HPI UHURA Jessica Erickson is a 80 y.o. female who is here for reevaluation due to pain all over, generalized weakness, and vomiting. Patient was seen earlier this afternoon between 9 and 3 in the emergency department. This morning she woke up and felt dizzy and had a fall, but does not report headache injury. Yesterday she had a normal day and felt well and was preparing for a bridge party. After she was evaluated here in the emergency department today, she was discharged home but since then she's had persistent and somewhat worsening pain "all over". When I ask her more specifically, it seems that the most of the pain is in her low back. She is also complaining about pain in both arms and both legs. Her son thought she was having pain in her right hip, however she has been walking. She is not running a fever. She did vomit once at home. She had not taken oxycodone, and they are unsure why she vomited. She does not have a habit of vomiting. She's not had any diarrhea. She's not had a cough or trouble breathing. Patient states she is too weak to walk around at this point in time.    Past Medical History  Diagnosis Date  . History of kidney problems   . Diabetes ([place])   . Asthma   . Osteoarthritis   . Bilateral swelling of feet     and legs  . Neuropathy ([place])     There are no active problems to display for this patient.   Past Surgical History  Procedure Laterality Date  . Brain tumor surgery    . Thyroid surgery    . Throat surgery    . Cholecystectomy      Current Outpatient Rx  Name  Route  Sig  Dispense  Refill  . acetaminophen (TYLENOL) 325 MG tablet   Oral   Take 2 tablets (650 mg total) by mouth every 6 (six) hours as  needed.   60 tablet   0   . ASPIRIN LOW DOSE 81 MG EC tablet               . atorvastatin (LIPITOR) 40 MG tablet   Oral   Take 40 mg by mouth daily.         Marland Kitchen CALCIUM CITRATE PO   Oral   Take by mouth.         . carbamazepine (TEGRETOL XR) 200 MG 12 hr tablet   Oral   Take 200 mg by mouth 3 (three) times daily.         . cloNIDine (CATAPRES) 0.1 MG tablet   Oral   Take 0.1 mg by mouth.         . Diphenhydramine-APAP, sleep, (TYLENOL PM EXTRA STRENGTH PO)   Oral   Take by mouth at bedtime.         . DULoxetine (CYMBALTA) 30 MG capsule   Oral   Take 30 mg by mouth daily.         . Esomeprazole Magnesium (NEXIUM PO)   Oral   Take by mouth 2 (two) times daily.         . fosinopril (MONOPRIL) 10 MG tablet   Oral   Take 10 mg by mouth  daily.         . levothyroxine (LEVOXYL) 150 MCG tablet   Oral   Take 150 mcg by mouth daily before breakfast.         . metFORMIN (GLUCOPHAGE) 500 MG tablet   Oral   Take by mouth 2 (two) times daily with a meal.         . Multiple Vitamin (MULTIVITAMIN) tablet   Oral   Take 1 tablet by mouth daily.         . nitrofurantoin, macrocrystal-monohydrate, (MACROBID) 100 MG capsule   Oral   Take 100 mg by mouth at bedtime.         Marland Kitchen omeprazole (PRILOSEC) 40 MG capsule               . oxyCODONE (ROXICODONE) 5 MG immediate release tablet   Oral   Take 0.5 tablets (2.5 mg total) by mouth every 6 (six) hours as needed for breakthrough pain.   8 tablet   0   . venlafaxine (EFFEXOR) 75 MG tablet                 Allergies Aspirin; Penicillins; and Sulfa antibiotics  Family History  Problem Relation Age of Onset  . Diabetes Mother     Social History Social History  Substance Use Topics  . Smoking status: Never Smoker   . Smokeless tobacco: Never Used  . Alcohol Use: No    Review of Systems  Constitutional: Negative for fever. Eyes: Negative for visual changes. ENT: Negative for sore  throat. Cardiovascular: Negative for chest pain or palpitations.Marland Kitchen Respiratory: Negative for shortness of breath. Gastrointestinal: denies focal abdominal pain, but does have vomiting. No diarrhea. Genitourinary: Negative for dysuria. Musculoskeletal: positive for back pain. Skin: Negative for rash. Neurological: Negative for headache. 10 point Review of Systems otherwise negative ____________________________________________   PHYSICAL EXAM:  VITAL SIGNS: ED Triage Vitals  Enc Vitals Group     BP 09/15/15 1835 138/77 mmHg     Pulse Rate 09/15/15 1835 76     Resp 09/15/15 1835 22     Temp 09/15/15 1835 97.9 F (36.6 C)     Temp Source 09/15/15 1835 Oral     SpO2 09/15/15 1835 99 %     Weight 09/15/15 1835 181 lb (82.101 kg)     Height 09/15/15 1835 5\' 9"  (1.753 m)     Head Cir --      Peak Flow --      Pain Score 09/15/15 1836 10     Pain Loc --      Pain Edu? --      Excl. in [place]? --      Constitutional: Alert and oriented. Squinting in "pain", however no acute distress. Eyes: Conjunctivae are normal. PERRL. Normal extraocular movements. ENT   Head: Normocephalic and atraumatic.   Nose: No congestion/rhinnorhea.   Mouth/Throat: Mucous membranes are mildly dry..   Neck: No stridor.no posterior midline tenderness. Cardiovascular/Chest: Normal rate, regular rhythm.  No murmurs, rubs, or gallops. Respiratory: Normal respiratory effort without tachypnea nor retractions. Breath sounds are clear and equal bilaterally. No wheezes/rales/rhonchi. Gastrointestinal: Soft. No distention, no guarding, no rebound. Mild tenderness. Obese.  Genitourinary/rectal:Deferred Musculoskeletal: mild tenderness to the muscles of the arms and legs, without bony tenderness specifically of the hip or pelvis. normal range of motion in all extremities. No joint effusions.   No edema. Neurologic:  Normal speech and language. No gross or focal neurologic deficits are appreciated. Skin:  Skin  is warm, dry and intact. No rash noted.   ____________________________________________   EKG I, Lisa Roca, MD, the attending physician have personally viewed and interpreted all ECGs.  No EKG performed ____________________________________________  LABS (pertinent positives/negatives)  White blood count 12.4, hemoglobin 13.7, platelet count 227 , Grandson metabolic panel without significant abnormality  ____________________________________________  RADIOLOGY All Xrays were viewed by me. Imaging interpreted by Radiologist.  CT scan abdomen and pelvis with contrast: Pending __________________________________________  PROCEDURES  Procedure(s) performed: None  Critical Care performed: None  ____________________________________________   ED COURSE / ASSESSMENT AND PLAN  CONSULTATIONS: None  Pertinent labs & imaging results that were available during my care of the patient were reviewed by me and considered in my medical decision making (see chart for details).  Patient back for reevaluation due to feeling generally weak all over with "pain all over. "She's also had an episode of emesis at home and again here in the emergency department. I'm somewhat suspicious that the reason why she may have fell this morning is due to potential viral illness.    Because of her elevated white blood cell count, and some abdominal pain, with significant back pain, I am cannot obtain at CT of the abdomen and pelvis with contrast. Family still concerned that her hips may be hurting her especially with the fall, however she has been walking and so I'm less suspicious that she has a pelvic or hip fracture, however this will be visualized on the CT as well.  Patient was given IV fluids as well as morphine and Zofran for symptoms here.  I reviewed patient's urinalysis from earlier today which is negative for urinary tract infection.  If the patient feels better within 20 treatment, and her CT  scan is reassuring, it may be reasonable to discharge patient home. Patient will need reevaluation to assess her situation.  Patient care transferred to Dr. Archie Balboa at 10 PM. Patient will likely be transferred to oncoming night physician at 11 PM.  Patient / Family / Caregiver informed of clinical course, medical decision-making process, and agree with plan.    ___________________________________________   FINAL CLINICAL IMPRESSION(S) / ED DIAGNOSES   Final diagnoses:  Generalized pain  Non-intractable vomiting with nausea, vomiting of unspecified type       Lisa Roca, MD 09/15/15 2148

## 2015-09-16 ENCOUNTER — Emergency Department: Payer: Medicare PPO

## 2015-09-16 DIAGNOSIS — Z79899 Other long term (current) drug therapy: Secondary | ICD-10-CM | POA: Diagnosis not present

## 2015-09-16 DIAGNOSIS — R609 Edema, unspecified: Secondary | ICD-10-CM | POA: Diagnosis present

## 2015-09-16 DIAGNOSIS — K8689 Other specified diseases of pancreas: Secondary | ICD-10-CM | POA: Diagnosis present

## 2015-09-16 DIAGNOSIS — M545 Low back pain: Secondary | ICD-10-CM | POA: Diagnosis present

## 2015-09-16 DIAGNOSIS — E785 Hyperlipidemia, unspecified: Secondary | ICD-10-CM | POA: Diagnosis present

## 2015-09-16 DIAGNOSIS — Z7982 Long term (current) use of aspirin: Secondary | ICD-10-CM | POA: Diagnosis not present

## 2015-09-16 DIAGNOSIS — T421X5A Adverse effect of iminostilbenes, initial encounter: Secondary | ICD-10-CM | POA: Diagnosis present

## 2015-09-16 DIAGNOSIS — Z880 Allergy status to penicillin: Secondary | ICD-10-CM | POA: Diagnosis not present

## 2015-09-16 DIAGNOSIS — Z833 Family history of diabetes mellitus: Secondary | ICD-10-CM | POA: Diagnosis not present

## 2015-09-16 DIAGNOSIS — I10 Essential (primary) hypertension: Secondary | ICD-10-CM | POA: Diagnosis present

## 2015-09-16 DIAGNOSIS — R2681 Unsteadiness on feet: Secondary | ICD-10-CM | POA: Diagnosis present

## 2015-09-16 DIAGNOSIS — Z882 Allergy status to sulfonamides status: Secondary | ICD-10-CM | POA: Diagnosis not present

## 2015-09-16 DIAGNOSIS — Z9049 Acquired absence of other specified parts of digestive tract: Secondary | ICD-10-CM | POA: Diagnosis not present

## 2015-09-16 DIAGNOSIS — E039 Hypothyroidism, unspecified: Secondary | ICD-10-CM | POA: Diagnosis present

## 2015-09-16 DIAGNOSIS — Z87891 Personal history of nicotine dependence: Secondary | ICD-10-CM | POA: Diagnosis not present

## 2015-09-16 DIAGNOSIS — W19XXXA Unspecified fall, initial encounter: Secondary | ICD-10-CM | POA: Diagnosis present

## 2015-09-16 DIAGNOSIS — R0902 Hypoxemia: Secondary | ICD-10-CM | POA: Diagnosis present

## 2015-09-16 DIAGNOSIS — E1140 Type 2 diabetes mellitus with diabetic neuropathy, unspecified: Secondary | ICD-10-CM | POA: Diagnosis present

## 2015-09-16 DIAGNOSIS — Z888 Allergy status to other drugs, medicaments and biological substances status: Secondary | ICD-10-CM | POA: Diagnosis not present

## 2015-09-16 DIAGNOSIS — J449 Chronic obstructive pulmonary disease, unspecified: Secondary | ICD-10-CM | POA: Diagnosis present

## 2015-09-16 DIAGNOSIS — M1990 Unspecified osteoarthritis, unspecified site: Secondary | ICD-10-CM | POA: Diagnosis present

## 2015-09-16 DIAGNOSIS — Z9889 Other specified postprocedural states: Secondary | ICD-10-CM | POA: Diagnosis not present

## 2015-09-16 DIAGNOSIS — R52 Pain, unspecified: Secondary | ICD-10-CM | POA: Diagnosis present

## 2015-09-16 DIAGNOSIS — K5900 Constipation, unspecified: Secondary | ICD-10-CM | POA: Diagnosis present

## 2015-09-16 DIAGNOSIS — N950 Postmenopausal bleeding: Secondary | ICD-10-CM | POA: Diagnosis present

## 2015-09-16 DIAGNOSIS — J45909 Unspecified asthma, uncomplicated: Secondary | ICD-10-CM | POA: Diagnosis present

## 2015-09-16 DIAGNOSIS — R112 Nausea with vomiting, unspecified: Secondary | ICD-10-CM | POA: Diagnosis present

## 2015-09-16 DIAGNOSIS — G500 Trigeminal neuralgia: Secondary | ICD-10-CM | POA: Diagnosis present

## 2015-09-16 DIAGNOSIS — R1110 Vomiting, unspecified: Secondary | ICD-10-CM | POA: Diagnosis present

## 2015-09-16 LAB — GLUCOSE, CAPILLARY
Glucose-Capillary: 108 mg/dL — ABNORMAL HIGH (ref 65–99)
Glucose-Capillary: 124 mg/dL — ABNORMAL HIGH (ref 65–99)
Glucose-Capillary: 100 mg/dL — ABNORMAL HIGH (ref 65–99)
Glucose-Capillary: 108 mg/dL — ABNORMAL HIGH (ref 65–99)

## 2015-09-16 LAB — LIPASE, BLOOD: Lipase: 26 U/L (ref 22–51)

## 2015-09-16 MED ORDER — LEVOTHYROXINE SODIUM 75 MCG PO TABS
150.0000 ug | ORAL_TABLET | Freq: Every day | ORAL | Status: DC
  Administered 2015-09-16 – 2015-09-18 (×3): 150 ug via ORAL
  Filled 2015-09-16 (×3): qty 2

## 2015-09-16 MED ORDER — ASPIRIN EC 81 MG PO TBEC
81.0000 mg | DELAYED_RELEASE_TABLET | Freq: Every day | ORAL | Status: DC
  Administered 2015-09-16 – 2015-09-18 (×3): 81 mg via ORAL
  Filled 2015-09-16 (×3): qty 1

## 2015-09-16 MED ORDER — ADULT MULTIVITAMIN W/MINERALS CH
1.0000 | ORAL_TABLET | Freq: Every day | ORAL | Status: DC
  Administered 2015-09-16 – 2015-09-18 (×3): 1 via ORAL
  Filled 2015-09-16 (×3): qty 1

## 2015-09-16 MED ORDER — ATORVASTATIN CALCIUM 20 MG PO TABS
40.0000 mg | ORAL_TABLET | Freq: Every day | ORAL | Status: DC
  Administered 2015-09-16 – 2015-09-17 (×2): 40 mg via ORAL
  Filled 2015-09-16 (×3): qty 2

## 2015-09-16 MED ORDER — ACETAMINOPHEN 325 MG PO TABS
650.0000 mg | ORAL_TABLET | Freq: Four times a day (QID) | ORAL | Status: DC | PRN
  Administered 2015-09-16 – 2015-09-17 (×6): 650 mg via ORAL
  Filled 2015-09-16 (×6): qty 2

## 2015-09-16 MED ORDER — SODIUM CHLORIDE 0.9 % IV SOLN
INTRAVENOUS | Status: DC
  Administered 2015-09-16 (×2): via INTRAVENOUS

## 2015-09-16 MED ORDER — CARBAMAZEPINE ER 200 MG PO TB12
200.0000 mg | ORAL_TABLET | Freq: Two times a day (BID) | ORAL | Status: DC
  Administered 2015-09-16 – 2015-09-18 (×6): 200 mg via ORAL
  Filled 2015-09-16 (×8): qty 1

## 2015-09-16 MED ORDER — ONDANSETRON HCL 4 MG PO TABS: 4.0000 mg | ORAL_TABLET | Freq: Four times a day (QID) | ORAL | Status: DC | PRN

## 2015-09-16 MED ORDER — FOSINOPRIL SODIUM 10 MG PO TABS
10.0000 mg | ORAL_TABLET | Freq: Every day | ORAL | Status: DC
  Filled 2015-09-16: qty 1

## 2015-09-16 MED ORDER — ENOXAPARIN SODIUM 40 MG/0.4ML ~~LOC~~ SOLN
40.0000 mg | Freq: Every day | SUBCUTANEOUS | Status: DC
  Administered 2015-09-16 – 2015-09-18 (×3): 40 mg via SUBCUTANEOUS
  Filled 2015-09-16 (×4): qty 0.4

## 2015-09-16 MED ORDER — LISINOPRIL 10 MG PO TABS
10.0000 mg | ORAL_TABLET | Freq: Every day | ORAL | Status: DC
  Administered 2015-09-16 – 2015-09-18 (×3): 10 mg via ORAL
  Filled 2015-09-16 (×3): qty 1

## 2015-09-16 MED ORDER — CLONIDINE HCL 0.1 MG PO TABS
0.1000 mg | ORAL_TABLET | Freq: Every day | ORAL | Status: DC
  Administered 2015-09-16 – 2015-09-18 (×3): 0.1 mg via ORAL
  Filled 2015-09-16 (×3): qty 1

## 2015-09-16 MED ORDER — INSULIN ASPART 100 UNIT/ML ~~LOC~~ SOLN
0.0000 [IU] | Freq: Three times a day (TID) | SUBCUTANEOUS | Status: DC
  Administered 2015-09-16 – 2015-09-18 (×4): 1 [IU] via SUBCUTANEOUS
  Filled 2015-09-16: qty 1
  Filled 2015-09-16: qty 2
  Filled 2015-09-16 (×2): qty 1

## 2015-09-16 MED ORDER — SENNOSIDES-DOCUSATE SODIUM 8.6-50 MG PO TABS: 1.0000 | ORAL_TABLET | Freq: Every evening | ORAL | Status: DC | PRN

## 2015-09-16 MED ORDER — MAGNESIUM HYDROXIDE 400 MG/5ML PO SUSP
30.0000 mL | Freq: Two times a day (BID) | ORAL | Status: AC
  Administered 2015-09-16 (×2): 30 mL via ORAL
  Filled 2015-09-16 (×2): qty 30

## 2015-09-16 MED ORDER — ONDANSETRON HCL 4 MG/2ML IJ SOLN
4.0000 mg | Freq: Four times a day (QID) | INTRAMUSCULAR | Status: DC | PRN
  Administered 2015-09-16 – 2015-09-17 (×4): 4 mg via INTRAVENOUS
  Filled 2015-09-16 (×4): qty 2

## 2015-09-16 MED ORDER — METFORMIN HCL 500 MG PO TABS
500.0000 mg | ORAL_TABLET | Freq: Two times a day (BID) | ORAL | Status: DC
  Administered 2015-09-18 (×2): 500 mg via ORAL
  Filled 2015-09-16 (×3): qty 1

## 2015-09-16 MED ORDER — OXYCODONE HCL 5 MG PO TABS
2.5000 mg | ORAL_TABLET | Freq: Four times a day (QID) | ORAL | Status: DC | PRN
  Administered 2015-09-16 – 2015-09-18 (×6): 2.5 mg via ORAL
  Filled 2015-09-16 (×6): qty 1

## 2015-09-16 NOTE — Evaluation (Signed)
Physical Therapy Evaluation Patient Details Name: Jessica Erickson MRN: [id number] DOB: [birth year] Today's Date: 09/16/2015   History of Present Illness  81 y.o. female with a known history of trigeminal neuralgia status post surgery 2 on, hypertension comes to the emergency room to second time today after she was found to have increasing nausea and vomiting. Patient was seen initially in the emergency room after a fall during the daytime. She was apparently taking increasing amount of Tegretol for her trigeminal neuralgia. According to the patient this was increased by her neurologist at [facility]. Patient had a fall, nontraumatic, evaluated at the emergency room arranged physical therapy and was sent home.  Clinical Impression  Upon evaluation, pt reports feeling less nauseous than she has in the last couple of days but is still feeling a significant amount of pain throughout her body. Pt demonstrated good sequencing of movements for bed mobility but did not possess the upper body/core strength required to help herself into full sitting on EOB. Pt required mod assist for supine<>sit tranfers for control of upper body and trunk. Pt reported feeling nauseous and dry heaved several times while sitting on the side of the bed. Pt did demonstrate motivation for performance of activity despite her pain and nausea. Pt was min assist for sit<>stand transfer and gait (both w/RW) ; pt walks at a very slow pace which places her at increased risk of falls. Pt requires continued skilled acute PT services in order to increase functional strength and capacity for functional mobility.     Follow Up Recommendations SNF    Equipment Recommendations  Rolling walker with 5" wheels    Recommendations for Other Services       Precautions / Restrictions Precautions Precautions: Fall Restrictions Weight Bearing Restrictions: No      Mobility  Bed Mobility Overal bed mobility: Needs Assistance Bed Mobility:  Supine to Sit;Sit to Supine     Supine to sit: Mod assist Sit to supine: Mod assist   General bed mobility comments: Pt requires assistance with control of trunk and shoulders for coming completely to supine; able to move BLEs out to edge of bed independently without cues for sequencing  Transfers Overall transfer level: Needs assistance Equipment used: Rolling walker (2 wheeled) Transfers: Sit to/from Stand Sit to Stand: Min assist            Ambulation/Gait Ambulation/Gait assistance: Min assist Ambulation Distance (Feet): 20 Feet Assistive device: Rolling walker (2 wheeled) Gait Pattern/deviations: Step-through pattern;Decreased step length - right;Decreased step length - left;Decreased stride length;Trunk flexed Gait velocity: decreased Gait velocity interpretation: <1.8 ft/sec, indicative of risk for recurrent falls    Stairs            Wheelchair Mobility    Modified Rankin (Stroke Patients Only)       Balance Overall balance assessment: No apparent balance deficits (not formally assessed)                                           Pertinent Vitals/Pain Pain Assessment: No/denies pain (pt states that she has pain "all over")    Home Living Family/patient expects to be discharged to:: Private residence Living Arrangements: Alone   Type of Home: Apartment Home Access: Level entry     Home Layout: One level Home Equipment: Cane - single point      Prior Function Level of Independence: Independent  with assistive device(s)         Comments: uses SPC for all ambulation in and outside of the home     Hand Dominance        Extremity/Trunk Assessment   Upper Extremity Assessment: Overall WFL for tasks assessed           Lower Extremity Assessment: Overall WFL for tasks assessed (gross BLE strength at least 3+/5)         Communication   Communication: HOH  Cognition Arousal/Alertness: Awake/alert Behavior During  Therapy: WFL for tasks assessed/performed Overall Cognitive Status: Within Functional Limits for tasks assessed                      General Comments      Exercises        Assessment/Plan    PT Assessment Patient needs continued PT services  PT Diagnosis Generalized weakness;Acute pain   PT Problem List Decreased strength;Decreased activity tolerance;Decreased mobility;Pain  PT Treatment Interventions DME instruction;Gait training;Functional mobility training;Therapeutic activities;Therapeutic exercise   PT Goals (Current goals can be found in the Care Plan section) Acute Rehab PT Goals Patient Stated Goal: to feel less nauseous and less pain PT Goal Formulation: With patient Time For Goal Achievement: 09/30/15 Potential to Achieve Goals: Good    Frequency Min 2X/week   Barriers to discharge        Co-evaluation               End of Session Equipment Utilized During Treatment: Gait belt Activity Tolerance: Patient tolerated treatment well;Patient limited by fatigue (pt limited by nausea and fatigue) Patient left: in bed;with call bell/phone within reach;with bed alarm set           Time: 1035-1100 PT Time Calculation (min) (ACUTE ONLY): 25 min   Charges:         PT G Codes:        Jessica Erickson,SPT 09/16/2015, 1:21 PM

## 2015-09-16 NOTE — ED Notes (Signed)
Pt's son, Jonae Coble, can be reached at [phone].

## 2015-09-16 NOTE — ED Notes (Addendum)
Pt ambulatory with assistance and walker. Pt with unsteady gait. Pt's O2 sats 84-87% RA while ambulating. Pt c/o generalized weakness and pain. Pt assisted back to bed. Pt incontinent of urine. Pt cleaned and clean, dry linens and incontinent pad applied. Pt AAOX4. NAD noted. RR even and nonlabored. Will continue to monitor.

## 2015-09-16 NOTE — Progress Notes (Signed)
[place] at [place] NAME: Jessica Erickson    MR#:  [id number]  DATE OF BIRTH:  07-28-1926  SUBJECTIVE:  CHIEF COMPLAINT:   Chief Complaint  Patient presents with  . Emesis   Patient presented here due to generalized weakness, nausea and vomiting. Son at bedside. Patient still feels nauseated but no vomiting noted on the hospital. No other complaints.  REVIEW OF SYSTEMS:    Review of Systems  Constitutional: Negative for fever and chills.  HENT: Negative for congestion and tinnitus.   Eyes: Negative for blurred vision and double vision.  Respiratory: Negative for cough, shortness of breath and wheezing.   Cardiovascular: Negative for chest pain, orthopnea and PND.  Gastrointestinal: Positive for nausea and vomiting. Negative for abdominal pain and diarrhea.  Genitourinary: Negative for dysuria and hematuria.  Neurological: Positive for weakness. Negative for dizziness, sensory change and focal weakness.  All other systems reviewed and are negative.   Nutrition: Heart healthy Tolerating Diet: Poor intake Tolerating PT: Evaluation noted   DRUG ALLERGIES:   Allergies  Allergen Reactions  . Aspirin     Had one but baby aspirin does not seem to bother her.  . Penicillins   . Sulfa Antibiotics     VITALS:  Blood pressure 137/50, pulse 55, temperature 97.6 F (36.4 C), temperature source Oral, resp. rate 18, height 5\' 9"  (1.753 m), weight 82.101 kg (181 lb), SpO2 100 %.  PHYSICAL EXAMINATION:   Physical Exam  GENERAL:  81 y.o.-year-old patient lying in the bed in no acute distress.  EYES: Pupils equal, round, reactive to light and accommodation. No scleral icterus. Extraocular muscles intact.  HEENT: Head atraumatic, normocephalic. Oropharynx and nasopharynx clear.  NECK:  Supple, no jugular venous distention. No thyroid enlargement, no tenderness.  LUNGS: Normal breath sounds bilaterally, no wheezing, rales, rhonchi. No  use of accessory muscles of respiration.  CARDIOVASCULAR: S1, S2 normal. No murmurs, rubs, or gallops.  ABDOMEN: Soft, nontender, nondistended. Bowel sounds present. No organomegaly or mass.  EXTREMITIES: No cyanosis, clubbing or edema b/l.    NEUROLOGIC: Cranial nerves II through XII are intact. No focal Motor or sensory deficits b/l. Globally weak PSYCHIATRIC: The patient is alert and oriented x 3. Good affect SKIN: No obvious rash, lesion, or ulcer.    LABORATORY PANEL:   CBC  Recent Labs Lab 09/15/15 1840  WBC 12.4*  HGB 13.7  HCT 41.5  PLT 227   ------------------------------------------------------------------------------------------------------------------  Chemistries   Recent Labs Lab 09/15/15 1840  NA 140  K 4.4  CL 104  CO2 28  GLUCOSE 148*  BUN 16  CREATININE 0.77  CALCIUM 8.9  AST 35  ALT 28  ALKPHOS 64  BILITOT 0.3   ------------------------------------------------------------------------------------------------------------------  Cardiac Enzymes No results for input(s): TROPONINI in the last 168 hours. ------------------------------------------------------------------------------------------------------------------  RADIOLOGY:  Ct Abdomen Pelvis W Contrast  09/15/2015  CLINICAL DATA:  Pain all over. Generalize weakness. Vomiting. Seen in the ED earlier today for dizziness and a fall. Persistent pain since that visit. EXAM: CT ABDOMEN AND PELVIS WITH CONTRAST TECHNIQUE: Multidetector CT imaging of the abdomen and pelvis was performed using the standard protocol following bolus administration of intravenous contrast. CONTRAST:  126mL OMNIPAQUE IOHEXOL 300 MG/ML  SOLN COMPARISON:  None. FINDINGS: Dependent atelectasis and fibrosis in the lung bases. Calcification in the coronary arteries. Small esophageal hiatal hernia. Mild diffuse fatty infiltration in the liver. Surgical absence of the gallbladder. Mild intra and extrahepatic bile duct dilatation  is  likely normal for postoperative physiology. Mildly dilated pancreatic duct. No obstructing lesion or stone is identified. Correlation with liver enzymes is recommended and liver enzymes is recommended and if positive, consider follow-up with elective MRI/MRCP to exclude focal obstructing lesion. Tiny sub cm lesion in the spleen probably representing small cyst or hemangioma. Adrenal glands are unremarkable. Mild right hydronephrosis and proximal hydroureter extending to about the level of L5. The ureter is decompressed distal to that area. No obstructing stone or lesion is identified. This could represent stricture or occult non radiopaque stone. Nephrograms are mostly symmetrical. Mild areas of scarring in the kidneys. Calcification of abdominal aorta without aneurysm. Inferior vena cava and retroperitoneal lymph nodes are unremarkable. Stomach is not abnormally distended. Redundant colon is filled with gas and stool. Borderline colonic distention is likely due to constipation. No wall thickening. No free air or free fluid in the abdomen. No free air or free fluid in the abdomen. Pelvis: Uterus and ovaries are not enlarged. Stone versus dystrophic calcification in the region of the bladder neck. No free or loculated pelvic fluid collections. No pelvic mass or lymphadenopathy. Appendix is not identified. Diffuse degenerative changes throughout the lumbar spine. Slight anterior subluxation of L4 on L5. Compression deformity at T12. No destructive bone lesions. IMPRESSION: Dilated intra and extrahepatic bile ducts as well as pancreatic duct. This may be physiologic but correlation with liver enzymes is suggested. If positive, consider follow-up with elective MRI/ MRCP. Mild right renal hydronephrosis and hydroureter to the mid ureteral region. No obstructing stone or lesion identified but occult stone or stricture not excluded. Redundant stool-filled colon suggesting constipation. Calcification in the bladder neck may  be a stone or dystrophic calcification. Electronically Signed   By: Lucienne Capers M.D.   On: 09/15/2015 23:30   Dg Chest Port 1 View  09/16/2015  CLINICAL DATA:  Decreased oxygen saturation on room air. EXAM: PORTABLE CHEST 1 VIEW COMPARISON:  09/15/2015 FINDINGS: Normal heart size and pulmonary vascularity. No focal airspace disease or consolidation in the lungs. No blunting of costophrenic angles. No pneumothorax. Mediastinal contours appear intact. Calcified and tortuous aorta. Prominent right paratracheal shadow is likely vascular and unchanged since previous study. IMPRESSION: No active disease. Electronically Signed   By: Lucienne Capers M.D.   On: 09/16/2015 00:46   Dg Chest Portable 1 View  09/15/2015  CLINICAL DATA:  Posterior left chest pain secondary to a fall this morning. EXAM: PORTABLE CHEST 1 VIEW COMPARISON:  06/05/2005 FINDINGS: Heart size and pulmonary vascularity are normal. Lungs are clear. Tortuosity and calcification of the thoracic aorta. No acute osseous abnormality. IMPRESSION: No acute abnormalities.  Aortic atherosclerosis. Electronically Signed   By: Lorriane Shire M.D.   On: 09/15/2015 12:04     ASSESSMENT AND PLAN:   81 year old female with past medical history of trigeminal neuralgia, diabetes, osteoarthritis, neuropathy, hypertension, hyperlipidemia, hypothyroidism who presents to the hospital due to generalized weakness and also nausea and vomiting.  1. Generalized weakness, unsteady gait suspected due to increased use Tegretol for her Trigeminal neuralgia.  - Patient reports her Tegretol was increased by her neurologist at [facility]. Will reduce dose and monitor.   Denies any focal weakness. CT head negative for stroke. - seen by PT and they recommend SNF.    2. Nausea vomiting. Etiology unclear. Appears viral syndrome versus side effects from Tegretol - Patient also had abdominal CT scan but dilated intra-and extrahepatic bile duct. Her LFTs are normal.  -  - asymptomatic presently and  hold off on GI consult at this time.  - cont. To encourage PO intake.  - cont. IV fluids, anti-emetics and supportive care for now.   3. Hypertension - cont. Clonidine, Lisinopril  4. History of trigeminal neurolagia - cont. Lower dose Tegretol.   5. Hypoxemia - likely related to underlying COPD given her smoking hx.  Likely needs PFT's as outpatient.   - Chest x-ray negative for pneumonia.  6. DM - cont. Metformin.  SSI. Follow BS.    7. Hypothyroidism - cont. Synthroid.    8. Hyperlipidemia-  Cont. Atorvastatin.     All the records are reviewed and case discussed with Care Management/Social Workerr. Management plans discussed with the patient, family and they are in agreement.  CODE STATUS: Full  DVT Prophylaxis: Lovenox  TOTAL TIME TAKING CARE OF THIS PATIENT: 25 minutes.   POSSIBLE D/C IN 1-2 DAYS, DEPENDING ON CLINICAL CONDITION.   Henreitta Leber M.D on 09/16/2015 at 2:55 PM  Between 7am to 6pm - Pager - [phone]  After 6pm go to [email or web address] - password [email or web address] [name]  Office  [phone]  CC: Primary care physician; Volanda Napoleon, MD

## 2015-09-16 NOTE — Care Management Note (Signed)
Case Management Note  Patient Details  Name: Jessica Erickson MRN: [id number] Date of Birth: 03-04-1926  Subjective/Objective:                 Patient presents from home status post fall, with complaints of n/v, and found to be hypoxic.  Patient is resting and her son [name] is at bedside.  [name] states that patient uses [facility] to obtain her medication.  Patient lives at home alone, however has the support of her son.   Patient is acutely on 2 liters of O2.  Son states that patient is normally independent, ambulates with out any assistance, and drives her self.  At this time PT is recommending SNF.  Will up son on recommendation at his request.    Action/Plan: Social work consult placeed  Expected Discharge Date:                  Expected Discharge Plan:     In-House Referral:     Discharge planning Services     Post Acute Care Choice:    Choice offered to:     DME Arranged:    DME Agency:     HH Arranged:    [place] Agency:     Status of Service:     Medicare Important Message Given:    Date Medicare IM Given:    Medicare IM give by:    Date Additional Medicare IM Given:    Additional Medicare Important Message give by:     If discussed at [place] of Stay Meetings, dates discussed:    Additional Comments:  Beverly Sessions, RN 09/16/2015, 1:47 PM

## 2015-09-16 NOTE — ED Notes (Signed)
Pt's O2 sats 88% RA. Pt placed on [place] at 2L/min. Pt's O2 sats then 98-100%.

## 2015-09-16 NOTE — Care Management (Signed)
Attempted to call patients son and update on plan of care.  No answer

## 2015-09-16 NOTE — H&P (Addendum)
[place] [facility] [place] NAME: Jessica Erickson    MR#:  [id number]  DATE OF BIRTH:  1926-11-21  DATE OF ADMISSION:  09/15/2015  PRIMARY CARE PHYSICIAN: Volanda Napoleon, MD   REQUESTING/REFERRING PHYSICIAN: Dr. Owens Shark  CHIEF COMPLAINT:  Weakness, right facial pain, nausea or vomiting  HISTORY OF PRESENT ILLNESS:  Jessica Erickson  is a 81 y.o. female with a known history of trigeminal neuralgia status post surgery 2 on, hypertension comes to the emergency room to second time today after she was found to have increasing nausea and vomiting. Patient was seen initially in the emergency room after a fall during the daytime. She was apparently taking increasing amount of Tegretol for her trigeminal neuralgia. According to the patient this was increased by her neurologist at [facility]. Patient had a fall, nontraumatic, evaluated at the emergency room arranged physical therapy and was sent home. Son brought her back to the emergency room today after she had significant nausea vomiting at home. She's he medically stable. She was receiving IV fluids. She was found to be hypoxic with 88% on room air. She has known history of smoking in the remote past for about 25 years. Patient denies any history of COPD/asthma. Patient is being admitted with nausea vomiting and hypoxia.  P,AST MEDICAL HISTORY:   Past Medical History  Diagnosis Date  . History of kidney problems   . Diabetes ([place])   . Asthma   . Osteoarthritis   . Bilateral swelling of feet     and legs  . Neuropathy ([place])     PAST SURGICAL HISTOIRY:   Past Surgical History  Procedure Laterality Date  . Brain tumor surgery    . Thyroid surgery    . Throat surgery    . Cholecystectomy      SOCIAL HISTORY:   Social History  Substance Use Topics  . Smoking status: Never Smoker   . Smokeless tobacco: Never Used  . Alcohol Use: No    FAMILY HISTORY:   Family History  Problem  Relation Age of Onset  . Diabetes Mother     DRUG ALLERGIES:   Allergies  Allergen Reactions  . Aspirin     Had one but baby aspirin does not seem to bother her.  . Penicillins   . Sulfa Antibiotics     REVIEW OF SYSTEMS:  Review of Systems  Constitutional: Negative for fever, chills and weight loss.  HENT: Negative for ear discharge, ear pain and nosebleeds.   Eyes: Negative for blurred vision, pain and discharge.  Respiratory: Negative for sputum production, shortness of breath, wheezing and stridor.   Cardiovascular: Negative for chest pain, palpitations, orthopnea and PND.  Gastrointestinal: Positive for nausea and vomiting. Negative for abdominal pain and diarrhea.  Genitourinary: Negative for urgency and frequency.  Musculoskeletal: Positive for falls and neck pain. Negative for back pain and joint pain.  Neurological: Positive for weakness. Negative for sensory change, speech change and focal weakness.  Psychiatric/Behavioral: Negative for depression and hallucinations. The patient is not nervous/anxious.   All other systems reviewed and are negative.    MEDICATIONS AT HOME:   Prior to Admission medications   Medication Sig Start Date End Date Taking? Authorizing Provider  acetaminophen (TYLENOL) 325 MG tablet Take 2 tablets (650 mg total) by mouth every 6 (six) hours as needed. 09/15/15  Yes Carrie Mew, MD  ASPIRIN LOW DOSE 81 MG EC tablet Take 81 mg by mouth daily.  06/29/14  Yes Historical Provider, MD  atorvastatin (LIPITOR) 40 MG tablet Take 40 mg by mouth daily.   Yes Historical Provider, MD  carbamazepine (TEGRETOL XR) 200 MG 12 hr tablet Take 200 mg by mouth 2 (two) times daily.    Yes Historical Provider, MD  cloNIDine (CATAPRES) 0.1 MG tablet Take 0.1 mg by mouth daily.  09/13/11  Yes Historical Provider, MD  Diphenhydramine-APAP, sleep, (TYLENOL PM EXTRA STRENGTH PO) Take by mouth at bedtime.   Yes Historical Provider, MD  fosinopril (MONOPRIL) 10 MG  tablet Take 10 mg by mouth daily.   Yes Historical Provider, MD  levothyroxine (LEVOXYL) 150 MCG tablet Take 150 mcg by mouth daily before breakfast.   Yes Historical Provider, MD  metFORMIN (GLUCOPHAGE) 500 MG tablet Take by mouth 2 (two) times daily with a meal.   Yes Historical Provider, MD  Multiple Vitamin (MULTIVITAMIN) tablet Take 1 tablet by mouth daily.   Yes Historical Provider, MD  oxyCODONE (ROXICODONE) 5 MG immediate release tablet Take 0.5 tablets (2.5 mg total) by mouth every 6 (six) hours as needed for breakthrough pain. 09/15/15  Yes Carrie Mew, MD      VITAL SIGNS:  Blood pressure 149/68, pulse 74, temperature 97.9 F (36.6 C), temperature source Oral, resp. rate 18, height 5\' 9"  (1.753 m), weight 82.101 kg (181 lb), SpO2 92 %.  PHYSICAL EXAMINATION:  GENERAL:  81 y.o.-year-old patient lying in the bed with no acute distress.  EYES: Pupils equal, round, reactive to light and accommodation. No scleral icterus. Extraocular muscles intact.  HEENT: Head atraumatic, normocephalic. Oropharynx and nasopharynx clear.  NECK:  Supple, no jugular venous distention. No thyroid enlargement, no tenderness.  LUNGS: Normal breath sounds bilaterally, no wheezing, rales,rhonchi or crepitation. No use of accessory muscles of respiration.  CARDIOVASCULAR: S1, S2 normal. No murmurs, rubs, or gallops.  ABDOMEN: Soft, nontender, nondistended. Bowel sounds present. No organomegaly or mass.  EXTREMITIES: No pedal edema, cyanosis, or clubbing.  NEUROLOGIC: Cranial nerves II through XII are intact. Muscle strength 5/5 in all extremities. Sensation intact. Gait not checked.  PSYCHIATRIC: The patient is alert and oriented x 3.  SKIN: No obvious rash, lesion, or ulcer.   LABORATORY PANEL:   CBC  Recent Labs Lab 09/15/15 1840  WBC 12.4*  HGB 13.7  HCT 41.5  PLT 227    ------------------------------------------------------------------------------------------------------------------  Chemistries   Recent Labs Lab 09/15/15 1840  NA 140  K 4.4  CL 104  CO2 28  GLUCOSE 148*  BUN 16  CREATININE 0.77  CALCIUM 8.9  AST 35  ALT 28  ALKPHOS 64  BILITOT 0.3   ------------------------------------------------------------------------------------------------------------------  Cardiac Enzymes No results for input(s): TROPONINI in the last 168 hours. ------------------------------------------------------------------------------------------------------------------  RADIOLOGY:  Ct Abdomen Pelvis W Contrast  09/15/2015  CLINICAL DATA:  Pain all over. Generalize weakness. Vomiting. Seen in the ED earlier today for dizziness and a fall. Persistent pain since that visit. EXAM: CT ABDOMEN AND PELVIS WITH CONTRAST TECHNIQUE: Multidetector CT imaging of the abdomen and pelvis was performed using the standard protocol following bolus administration of intravenous contrast. CONTRAST:  153mL OMNIPAQUE IOHEXOL 300 MG/ML  SOLN COMPARISON:  None. FINDINGS: Dependent atelectasis and fibrosis in the lung bases. Calcification in the coronary arteries. Small esophageal hiatal hernia. Mild diffuse fatty infiltration in the liver. Surgical absence of the gallbladder. Mild intra and extrahepatic bile duct dilatation is likely normal for postoperative physiology. Mildly dilated pancreatic duct. No obstructing lesion or stone is identified. Correlation with liver enzymes is recommended and  liver enzymes is recommended and if positive, consider follow-up with elective MRI/MRCP to exclude focal obstructing lesion. Tiny sub cm lesion in the spleen probably representing small cyst or hemangioma. Adrenal glands are unremarkable. Mild right hydronephrosis and proximal hydroureter extending to about the level of L5. The ureter is decompressed distal to that area. No obstructing stone or lesion  is identified. This could represent stricture or occult non radiopaque stone. Nephrograms are mostly symmetrical. Mild areas of scarring in the kidneys. Calcification of abdominal aorta without aneurysm. Inferior vena cava and retroperitoneal lymph nodes are unremarkable. Stomach is not abnormally distended. Redundant colon is filled with gas and stool. Borderline colonic distention is likely due to constipation. No wall thickening. No free air or free fluid in the abdomen. No free air or free fluid in the abdomen. Pelvis: Uterus and ovaries are not enlarged. Stone versus dystrophic calcification in the region of the bladder neck. No free or loculated pelvic fluid collections. No pelvic mass or lymphadenopathy. Appendix is not identified. Diffuse degenerative changes throughout the lumbar spine. Slight anterior subluxation of L4 on L5. Compression deformity at T12. No destructive bone lesions. IMPRESSION: Dilated intra and extrahepatic bile ducts as well as pancreatic duct. This may be physiologic but correlation with liver enzymes is suggested. If positive, consider follow-up with elective MRI/ MRCP. Mild right renal hydronephrosis and hydroureter to the mid ureteral region. No obstructing stone or lesion identified but occult stone or stricture not excluded. Redundant stool-filled colon suggesting constipation. Calcification in the bladder neck may be a stone or dystrophic calcification. Electronically Signed   By: Lucienne Capers M.D.   On: 09/15/2015 23:30   Dg Chest Port 1 View  09/16/2015  CLINICAL DATA:  Decreased oxygen saturation on room air. EXAM: PORTABLE CHEST 1 VIEW COMPARISON:  09/15/2015 FINDINGS: Normal heart size and pulmonary vascularity. No focal airspace disease or consolidation in the lungs. No blunting of costophrenic angles. No pneumothorax. Mediastinal contours appear intact. Calcified and tortuous aorta. Prominent right paratracheal shadow is likely vascular and unchanged since previous  study. IMPRESSION: No active disease. Electronically Signed   By: Lucienne Capers M.D.   On: 09/16/2015 00:46   Dg Chest Portable 1 View  09/15/2015  CLINICAL DATA:  Posterior left chest pain secondary to a fall this morning. EXAM: PORTABLE CHEST 1 VIEW COMPARISON:  06/05/2005 FINDINGS: Heart size and pulmonary vascularity are normal. Lungs are clear. Tortuosity and calcification of the thoracic aorta. No acute osseous abnormality. IMPRESSION: No acute abnormalities.  Aortic atherosclerosis. Electronically Signed   By: Lorriane Shire M.D.   On: 09/15/2015 12:04    EKG:   NSR IMPRESSION AND PLAN:    81 year old Jessica Erickson with past medical history of trigeminal neuralgia status post surgery the past, bilateral chronic edema lower extremity, osteoarthritis, hypertension comes into the emergency room after she had a fall this morning. She came in back to the emergency room with nausea vomiting was found to have mildly hypo-hypoxic at 88% on room air. She is being admitted with  1. Generalized weakness, unsteady gait suspected due to increased use of trigeminal and menorrhagia. Patient reports her Tegretol was increased by her neurologist at [facility]. She is taking currently 200 mg 3 times a day. Denies any focal weakness. CT head negative for stroke. We'll get physical therapy evaluate patient's gait. Management for discharge planning.  2. Nausea vomiting. Etiology unclear. Appears viral syndrome versus side effects from Tegretol Patient also had abdominal CT scan but dilated intra-and extrahepatic bile  duct. Her LFTs are normal. I will hold off on any MRCP at present. Consider GI consultation if needed. Patient currently is not having any vomiting. We'll give IV fluids for hydration. As needed Zofran.  3. Hypertension continue home meds  4. History of trigeminal neurolagia On Tegretol. She follows up neurology in [place]  5. Hypoxemia transient Patient has known history of  smoking for 25 years. She does not know of any COPD or asthma diagnosed with her. Currently patient is stable respiratory wise. We'll wean her to room air. Consider nebulizers versus oral inhalers if needed. Chest x-ray negative for pneumonia.  6. DVT prophylaxis Lovenox  7 discharge planning care management consult. PT consult. No family present in the emergency room.    All the records are reviewed and case discussed with ED provider. Management plans discussed with the patient, family and they are in agreement.  CODE STATUS: full  TOTAL TIME TAKING CARE OF THIS PAT86minutes.    Odysseus Cada M.D on 09/16/2015 at 1:36 AM  Between 7am to 6pm - Pager - [phone]  After 6pm go to [email or web address] - password [email or web address]  Office  [phone]  CC: Primary care physician; Volanda Napoleon, MD

## 2015-09-16 NOTE — ED Provider Notes (Addendum)
-----------------------------------------   12:29 AM on 09/16/2015 -----------------------------------------  Patient ambulated with unsteady gait with heavy assistance per nurse. Her O2 sats dropped to 84-87% on room air on ambulation. Will add EKG and portable chest x-ray. Discuss with hospitalist who will evaluate patient in the emergency department for admission.  Paulette Blanch, MD 09/16/15 0030  ED ECG REPORT I, Mattis Featherly J, the attending physician, personally viewed and interpreted this ECG.   Date: 09/16/2015  EKG Time: 0032  Rate: 77  Rhythm: normal EKG, normal sinus rhythm  Axis: normal  Intervals:none  ST&T Change: nonspecific  Portable chest x-ray (viewed by me, interpreted per Dr. Gerilyn Nestle): No active disease.   Paulette Blanch, MD 09/16/15 [id number]

## 2015-09-17 LAB — GLUCOSE, CAPILLARY
Glucose-Capillary: 128 mg/dL — ABNORMAL HIGH (ref 65–99)
Glucose-Capillary: 128 mg/dL — ABNORMAL HIGH (ref 65–99)
Glucose-Capillary: 99 mg/dL (ref 65–99)
Glucose-Capillary: 121 mg/dL — ABNORMAL HIGH (ref 65–99)

## 2015-09-17 MED ORDER — LACTULOSE 10 GM/15ML PO SOLN: 30.0000 g | Freq: Two times a day (BID) | ORAL | Status: DC | PRN

## 2015-09-17 NOTE — Progress Notes (Signed)
Initial Nutrition Assessment      INTERVENTION:  Meals and snacks: Cater to pt preferences. Pt may benefit from carb modified diet secondary to history of DM Medical Nutrition Supplement Therapy: If unable to meet nutritional goals will add supplement   NUTRITION DIAGNOSIS:   Inadequate oral intake related to altered GI function as evidenced by meal completion < 25%.    GOAL:   Patient will meet greater than or equal to 90% of their needs    MONITOR:    (Energy intake, Digestive system)  REASON FOR ASSESSMENT:   Malnutrition Screening Tool    ASSESSMENT:      Pt admitted with nausea, vomiting, weakness  Past Medical History  Diagnosis Date  . History of kidney problems   . Diabetes ([place])   . Asthma   . Osteoarthritis   . Bilateral swelling of feet     and legs  . Neuropathy ([facility])     Current Nutrition: ate few bites of mashed potatoes for lunch today, ate few bites of eggs and juice for breakfast and few bites of pears for dinner last night  Food/Nutrition-Related History: Son reports good appetite up until 2 days prior to admission   Medications: aspart, lactulose, MVI, senekot, metformin  Electrolyte/Renal Profile and Glucose Profile:   Recent Labs Lab 09/15/15 1126 09/15/15 1840  NA 140 140  K 4.2 4.4  CL 106 104  CO2 29 28  BUN 17 16  CREATININE 0.87 0.77  CALCIUM 8.7* 8.9  GLUCOSE 121* 148*   Protein Profile:  Recent Labs Lab 09/15/15 1840  ALBUMIN 3.9    Gastrointestinal Profile: Last BM:10/10   Nutrition-Focused Physical Exam Findings: Nutrition-Focused physical exam completed. Findings are no fat depletion, no muscle depletion, and mild edema.       Weight Change: per wt encounters 16% weight loss in the last year and 7 months (not significant)   Diet Order:  Diet Heart Room service appropriate?: Yes; Fluid consistency:: Thin  Skin:   reviewed   Height:   Ht Readings from Last 1 Encounters:  09/15/15 5\' 9"   (1.753 m)    Weight:   Wt Readings from Last 1 Encounters:  09/15/15 181 lb (82.101 kg)       BMI:  Body mass index is 26.72 kg/(m^2).   EDUCATION NEEDS:   No education needs identified at this time  LOW Care Level  Amyriah Buras B. Zenia Resides, [place], [place] (pager)

## 2015-09-17 NOTE — Progress Notes (Signed)
SNF and Non-Emergent EMS Transport Benefits:  Number called: [phone] Rep: Delana Meyer Reference Number: [id number]  [facility] Medicare plan active as of 12/04/14 with no deductible.  Out of pocket max is $3300, $354.92 met as of time of call.     In-network SNF: $0 copay for days 1-20 and $50 daily copay for days 21-100.  Once out of pocket is reached, patient covered at 100% for remainder of 100 day benefit period.  $0 copay for professional fees. Josem Kaufmann is required: [phone].    Non-emergent EMS transport: $75 copay per date of service.  Must be medically necessary.  Josem Kaufmann is not required for service codes [phone] or [phone].

## 2015-09-17 NOTE — Progress Notes (Signed)
Pt. Has been unable to sleep due to IV in her LCA beeping. Pt. request that her fluids be turned off. WE .tried a board and pillow with no success. She asked that she not be stuck again. MD notified and fluids turned off until the AM.

## 2015-09-17 NOTE — Clinical Social Work Note (Signed)
CSW met with patient's son this afternoon in patient's room and extended bed offers. Patient's son has chosen Herbalist and CSW has contacted Nature conservation officer at [place] to begin [place]. Shela Leff MSW,LCSW [phone]

## 2015-09-17 NOTE — Clinical Social Work Note (Signed)
Clinical Social Work Assessment  Patient Details  Name: Jessica Erickson MRN: [id number] Date of Birth: 08-20-1926  Date of referral:  09/17/15               Reason for consult:  Facility Placement                Permission sought to share information with:    Permission granted to share information::     Name::        Agency::     Relationship::     Contact Information:     Housing/Transportation Living arrangements for the past 2 months:  Single Family Home Source of Information:  Adult Children Patient Interpreter Needed:  None Criminal Activity/Legal Involvement Pertinent to Current Situation/Hospitalization:  No - Comment as needed Significant Relationships:  Adult Children Lives with:  Adult Children Do you feel safe going back to the place where you live?    Need for family participation in patient care:  Yes (Comment)  Care giving concerns:  Patient lives alone and son is nearby.    Social Worker assessment / plan:  CSW attempted to meet with patient but she was sleeping soundly and is intermittently confused. CSW contacted patient's son: Jessica Erickson: [id number]. CSW discussed PT recommendations for STR with Mr. Wynkoop and he is in agreement with this and feels that currently he would not be able to assist in her care. Patient had been independent and living alone. Patient has medicare [employer] and will require auth for STR. Bedsearch initiated.  Employment status:  Retired Nurse, adult PT Recommendations:  [place] / Referral to community resources:  [place]  Patient/Family's Response to care:  Unable to speak to patient at this time.  Patient/Family's Understanding of and Emotional Response to Diagnosis, Current Treatment, and Prognosis:  Patient's son is in agreement with STR.  Emotional Assessment Appearance:  Appears stated age Attitude/Demeanor/Rapport:  Unable to Assess Affect (typically  observed):  Unable to Assess Orientation:  Fluctuating Orientation (Suspected and/or reported [place]) Alcohol / Substance use:  Not Applicable Psych involvement (Current and /or in the community):  No (Comment)  Discharge Needs  Concerns to be addressed:  Care Coordination Readmission within the last 30 days:  No Current discharge risk:  None Barriers to Discharge:  No Barriers Identified   Shela Leff, LCSW 09/17/2015, 11:38 AM

## 2015-09-17 NOTE — Progress Notes (Signed)
[facility] [place] NAME: Jessica Erickson    MR#:  [id number]  DATE OF BIRTH:  Oct 16, 1926  SUBJECTIVE:  CHIEF COMPLAINT:   Chief Complaint  Patient presents with  . Emesis   Patient presented here due to generalized weakness, nausea and vomiting. PO intake is still poor.  + nausea but no vomiting.  Still having generalized pain all over.  Seen by PT and they recommend SNF/STR.    REVIEW OF SYSTEMS:    Review of Systems  Constitutional: Negative for fever and chills.  HENT: Negative for congestion and tinnitus.   Eyes: Negative for blurred vision and double vision.  Respiratory: Negative for cough, shortness of breath and wheezing.   Cardiovascular: Negative for chest pain, orthopnea and PND.  Gastrointestinal: Positive for nausea and constipation. Negative for vomiting, abdominal pain and diarrhea.  Genitourinary: Negative for dysuria and hematuria.  Neurological: Positive for weakness. Negative for dizziness, sensory change and focal weakness.  All other systems reviewed and are negative.   Nutrition: Heart healthy Tolerating Diet: Poor intake Tolerating PT: Evaluation noted   DRUG ALLERGIES:   Allergies  Allergen Reactions  . Aspirin     Had one but baby aspirin does not seem to bother her.  . Penicillins   . Sulfa Antibiotics     VITALS:  Blood pressure 156/56, pulse 58, temperature 98.1 F (36.7 C), temperature source Oral, resp. rate 16, height 5\' 9"  (1.753 m), weight 82.101 kg (181 lb), SpO2 95 %.  PHYSICAL EXAMINATION:   Physical Exam  GENERAL:  81 y.o.-year-old patient lying in the bed in no acute distress.  EYES: Pupils equal, round, reactive to light and accommodation. No scleral icterus. Extraocular muscles intact.  HEENT: Head atraumatic, normocephalic. Oropharynx and nasopharynx clear.  NECK:  Supple, no jugular venous distention. No thyroid enlargement, no tenderness.  LUNGS: Normal breath sounds  bilaterally, no wheezing, rales, rhonchi. No use of accessory muscles of respiration.  CARDIOVASCULAR: S1, S2 normal. No murmurs, rubs, or gallops.  ABDOMEN: Soft, nontender, nondistended. Bowel sounds present. No organomegaly or mass.  EXTREMITIES: No cyanosis, clubbing or edema b/l.    NEUROLOGIC: Cranial nerves II through XII are intact. No focal Motor or sensory deficits b/l. Globally weak PSYCHIATRIC: The patient is alert and oriented x 3. Good affect SKIN: No obvious rash, lesion, or ulcer.    LABORATORY PANEL:   CBC  Recent Labs Lab 09/15/15 1840  WBC 12.4*  HGB 13.7  HCT 41.5  PLT 227   ------------------------------------------------------------------------------------------------------------------  Chemistries   Recent Labs Lab 09/15/15 1840  NA 140  K 4.4  CL 104  CO2 28  GLUCOSE 148*  BUN 16  CREATININE 0.77  CALCIUM 8.9  AST 35  ALT 28  ALKPHOS 64  BILITOT 0.3   ------------------------------------------------------------------------------------------------------------------  Cardiac Enzymes No results for input(s): TROPONINI in the last 168 hours. ------------------------------------------------------------------------------------------------------------------  RADIOLOGY:  Ct Abdomen Pelvis W Contrast  09/15/2015  CLINICAL DATA:  Pain all over. Generalize weakness. Vomiting. Seen in the ED earlier today for dizziness and a fall. Persistent pain since that visit. EXAM: CT ABDOMEN AND PELVIS WITH CONTRAST TECHNIQUE: Multidetector CT imaging of the abdomen and pelvis was performed using the standard protocol following bolus administration of intravenous contrast. CONTRAST:  114mL OMNIPAQUE IOHEXOL 300 MG/ML  SOLN COMPARISON:  None. FINDINGS: Dependent atelectasis and fibrosis in the lung bases. Calcification in the coronary arteries. Small esophageal hiatal hernia. Mild diffuse fatty infiltration in the liver.  Surgical absence of the gallbladder. Mild  intra and extrahepatic bile duct dilatation is likely normal for postoperative physiology. Mildly dilated pancreatic duct. No obstructing lesion or stone is identified. Correlation with liver enzymes is recommended and liver enzymes is recommended and if positive, consider follow-up with elective MRI/MRCP to exclude focal obstructing lesion. Tiny sub cm lesion in the spleen probably representing small cyst or hemangioma. Adrenal glands are unremarkable. Mild right hydronephrosis and proximal hydroureter extending to about the level of L5. The ureter is decompressed distal to that area. No obstructing stone or lesion is identified. This could represent stricture or occult non radiopaque stone. Nephrograms are mostly symmetrical. Mild areas of scarring in the kidneys. Calcification of abdominal aorta without aneurysm. Inferior vena cava and retroperitoneal lymph nodes are unremarkable. Stomach is not abnormally distended. Redundant colon is filled with gas and stool. Borderline colonic distention is likely due to constipation. No wall thickening. No free air or free fluid in the abdomen. No free air or free fluid in the abdomen. Pelvis: Uterus and ovaries are not enlarged. Stone versus dystrophic calcification in the region of the bladder neck. No free or loculated pelvic fluid collections. No pelvic mass or lymphadenopathy. Appendix is not identified. Diffuse degenerative changes throughout the lumbar spine. Slight anterior subluxation of L4 on L5. Compression deformity at T12. No destructive bone lesions. IMPRESSION: Dilated intra and extrahepatic bile ducts as well as pancreatic duct. This may be physiologic but correlation with liver enzymes is suggested. If positive, consider follow-up with elective MRI/ MRCP. Mild right renal hydronephrosis and hydroureter to the mid ureteral region. No obstructing stone or lesion identified but occult stone or stricture not excluded. Redundant stool-filled colon suggesting  constipation. Calcification in the bladder neck may be a stone or dystrophic calcification. Electronically Signed   By: Lucienne Capers M.D.   On: 09/15/2015 23:30   Dg Chest Port 1 View  09/16/2015  CLINICAL DATA:  Decreased oxygen saturation on room air. EXAM: PORTABLE CHEST 1 VIEW COMPARISON:  09/15/2015 FINDINGS: Normal heart size and pulmonary vascularity. No focal airspace disease or consolidation in the lungs. No blunting of costophrenic angles. No pneumothorax. Mediastinal contours appear intact. Calcified and tortuous aorta. Prominent right paratracheal shadow is likely vascular and unchanged since previous study. IMPRESSION: No active disease. Electronically Signed   By: Lucienne Capers M.D.   On: 09/16/2015 00:46     ASSESSMENT AND PLAN:   81 year old female with past medical history of trigeminal neuralgia, diabetes, osteoarthritis, neuropathy, hypertension, hyperlipidemia, hypothyroidism who presents to the hospital due to generalized weakness and also nausea and vomiting.  1. Generalized weakness, unsteady gait suspected due to increased use Tegretol for her Trigeminal neuralgia.  - Patient reports her Tegretol was increased by her neurologist at [facility]. Denies any focal weakness. CT head negative for stroke.  Cont. Lower dose Tegretol and monitor.  - seen by PT and they recommend SNF and social worked made aware.    2. Nausea vomiting. Etiology unclear. Appears viral syndrome versus side effects from Tegretol - Patient also had abdominal CT scan but dilated intra-and extrahepatic bile duct. Her LFTs are normal.   - asymptomatic presently and hold off on GI consult at this time.  - cont. To encourage PO intake. ?? Constipation and will add Lactulose to MOM and monitor.  - cont. Supportive care w/ anti-emetics for now.     3. Hypertension - cont. Clonidine, Lisinopril  4. History of trigeminal neurolagia - cont. Lower dose Tegretol.  5. Hypoxemia - transient and  resolved now.  - likely related to underlying COPD given her smoking hx.  Likely needs PFT's as outpatient.   - Chest x-ray negative for pneumonia.  6. DM - cont. Metformin.  SSI. Follow BS.    7. Hypothyroidism - cont. Synthroid.    8. Hyperlipidemia-  Cont. Atorvastatin.    All the records are reviewed and case discussed with Care Management/Social Workerr. Management plans discussed with the patient, family and they are in agreement.  CODE STATUS: Full  DVT Prophylaxis: Lovenox  TOTAL TIME TAKING CARE OF THIS PATIENT: 25 minutes.   POSSIBLE D/C IN 1-2 DAYS, DEPENDING ON CLINICAL CONDITION.   Henreitta Leber M.D on 09/17/2015 at 2:20 PM  Between 7am to 6pm - Pager - [phone]  After 6pm go to [email or web address] - password [email or web address] [facility]  Office  [phone]  CC: Primary care physician; Volanda Napoleon, MD

## 2015-09-17 NOTE — Progress Notes (Signed)
PT Cancellation Note  Patient Details Name: [name] MRN: [id number] DOB: Feb 28, 1926   Cancelled Treatment:    Reason Eval/Treat Not Completed: Pain limiting ability to participate (Pt denied to be seen due to pain; will re-attempt at later time/date. )   Milon Score 09/17/2015, 3:39 PM

## 2015-09-18 ENCOUNTER — Inpatient Hospital Stay: Payer: Medicare PPO

## 2015-09-18 LAB — BASIC METABOLIC PANEL
Anion gap: 6 (ref 5–15)
BUN: 11 mg/dL (ref 6–20)
CO2: 29 mmol/L (ref 22–32)
Calcium: 8.3 mg/dL — ABNORMAL LOW (ref 8.9–10.3)
Chloride: 105 mmol/L (ref 101–111)
Creatinine, Ser: 0.74 mg/dL (ref 0.44–1.00)
GFR calc Af Amer: >60 mL/min (ref >60)
GFR calc non Af Amer: >60 mL/min (ref >60)
Glucose, Bld: 126 mg/dL — ABNORMAL HIGH (ref 65–99)
Potassium: 4.1 mmol/L (ref 3.5–5.1)
Sodium: 140 mmol/L (ref 135–145)

## 2015-09-18 LAB — GLUCOSE, CAPILLARY
Glucose-Capillary: 119 mg/dL — ABNORMAL HIGH (ref 65–99)
Glucose-Capillary: 124 mg/dL — ABNORMAL HIGH (ref 65–99)
Glucose-Capillary: 106 mg/dL — ABNORMAL HIGH (ref 65–99)
Glucose-Capillary: 117 mg/dL — ABNORMAL HIGH (ref 65–99)

## 2015-09-18 LAB — CBC
HCT: 38.5 % (ref 35.0–47.0)
Hemoglobin: 13 g/dL (ref 12.0–16.0)
MCH: 31.2 pg (ref 26.0–34.0)
MCHC: 33.6 g/dL (ref 32.0–36.0)
MCV: 92.7 fL (ref 80.0–100.0)
Platelets: 186 10*3/uL (ref 150–440)
RBC: 4.16 MIL/uL (ref 3.80–5.20)
RDW: 13.6 % (ref 11.5–14.5)
WBC: 9 10*3/uL (ref 3.6–11.0)

## 2015-09-18 MED ORDER — MORPHINE SULFATE (PF) 2 MG/ML IV SOLN
2.0000 mg | INTRAVENOUS | Status: DC | PRN
  Administered 2015-09-18: 2 mg via INTRAVENOUS
  Filled 2015-09-18: qty 1

## 2015-09-18 MED ORDER — LISINOPRIL 10 MG PO TABS: 10.0000 mg | ORAL_TABLET | Freq: Every day | ORAL | Status: DC

## 2015-09-18 MED ORDER — LIDOCAINE 5 % EX PTCH
1.0000 | MEDICATED_PATCH | CUTANEOUS | Status: DC
  Administered 2015-09-18: 1 via TRANSDERMAL
  Filled 2015-09-18: qty 1

## 2015-09-18 NOTE — Progress Notes (Signed)
Patient transported via EMS to [facility]. Room ICU 2727 report was given to [facility]. Jessica Erickson

## 2015-09-18 NOTE — Consult Note (Signed)
GI Inpatient Consult Note Lollie Sails MD  Reason for Consult: Dilated bile ducts   Attending Requesting Consult: Dr. Charissa Bash  Outpatient Primary Physician: Volanda Napoleon, MD   History of Present Illness: Jessica Erickson is a 81 y.o. female who was admitted to the hospital on 09/16/2015 with nausea vomiting weakness and right facial pain. She was also somewhat hypoxic at that time with an O2 sat of 88% on room air.she does not use home oxygen.  Currently she is tolerating some soft foods however continues to have some nausea, no vomiting, and there is no abdominal pain.  Her main complaint is back pain. She apparently had a fall prior to coming to the hospital. He has severe back pain when she moves in the bed.  sHe also has a history of trigeminal neuralgia for which she takes Tegretol. Review of her chart indicates a normal CBC normal lipase normal LFTs done 3 days ago. He T scan done on 1012 shows a mild intra-and extrahepatic biliary ductal dilatation as well as a mild pancreatic ductal dilatation all of this felt likely to be normal postoperative changes in regards to her remote cholecystectomy.  H and states that she continues to be nauseated her last emesis yesterday. There is no abdominal pain. There is no dysphagia or heartburn. She had a cholecystectomy over 20 years ago. She generally has a bowel movement once a week and only after taking a glass of hot prune juice. He denies any black stool blood in the stools or slimy stools.  Past Medical History:  Past Medical History  Diagnosis Date  . History of kidney problems   . Diabetes ([place])   . Asthma   . Osteoarthritis   . Bilateral swelling of feet     and legs  . Neuropathy ([place])     Problem List: Patient Active Problem List   Diagnosis Date Noted  . Vomiting 09/16/2015    Past Surgical History: Past Surgical History  Procedure Laterality Date  . Brain tumor surgery    . Thyroid surgery    . Throat  surgery    . Cholecystectomy      Allergies: Allergies  Allergen Reactions  . Aspirin     Had one but baby aspirin does not seem to bother her.  . Penicillins   . Sulfa Antibiotics     Home Medications: Prescriptions prior to admission  Medication Sig Dispense Refill Last Dose  . acetaminophen (TYLENOL) 325 MG tablet Take 2 tablets (650 mg total) by mouth every 6 (six) hours as needed. 60 tablet 0   . ASPIRIN LOW DOSE 81 MG EC tablet Take 81 mg by mouth daily.    09/15/2015 at Unknown time  . atorvastatin (LIPITOR) 40 MG tablet Take 40 mg by mouth daily.   09/15/2015 at Unknown time  . carbamazepine (TEGRETOL XR) 200 MG 12 hr tablet Take 200 mg by mouth 2 (two) times daily.    09/15/2015 at am  . cloNIDine (CATAPRES) 0.1 MG tablet Take 0.1 mg by mouth daily.    09/15/2015 at Unknown time  . Diphenhydramine-APAP, sleep, (TYLENOL PM EXTRA STRENGTH PO) Take by mouth at bedtime.   09/15/2015 at Unknown time  . fosinopril (MONOPRIL) 10 MG tablet Take 10 mg by mouth daily.   09/15/2015 at Unknown time  . levothyroxine (LEVOXYL) 150 MCG tablet Take 150 mcg by mouth daily before breakfast.   09/15/2015 at Unknown time  . metFORMIN (GLUCOPHAGE) 500 MG tablet Take by  mouth 2 (two) times daily with a meal.   09/15/2015 at Unknown time  . Multiple Vitamin (MULTIVITAMIN) tablet Take 1 tablet by mouth daily.   09/15/2015 at Unknown time  . oxyCODONE (ROXICODONE) 5 MG immediate release tablet Take 0.5 tablets (2.5 mg total) by mouth every 6 (six) hours as needed for breakthrough pain. 8 tablet 0    Home medication reconciliation was completed with the patient.   Scheduled Inpatient Medications:   . aspirin EC  81 mg Oral Daily  . atorvastatin  40 mg Oral QHS  . carbamazepine  200 mg Oral BID  . cloNIDine  0.1 mg Oral Daily  . enoxaparin (LOVENOX) injection  40 mg Subcutaneous Daily  . insulin aspart  0-9 Units Subcutaneous TID WC  . levothyroxine  150 mcg Oral QAC breakfast  . lidocaine  1  patch Transdermal Q24H  . lisinopril  10 mg Oral Daily  . metFORMIN  500 mg Oral BID WC  . multivitamin with minerals  1 tablet Oral Daily    Continuous Inpatient Infusions:     PRN Inpatient Medications:  acetaminophen, lactulose, ondansetron **OR** ondansetron (ZOFRAN) IV, oxyCODONE, senna-docusate  Family History: family history includes Diabetes in her mother.   GI Family History: Noncontributory  Social History:   reports that she has never smoked. She has never used smokeless tobacco. She reports that she does not drink alcohol or use illicit drugs. The patient denies ETOH, tobacco, or drug use.   ROS  Review of Systems: 10 systems reviewed per admission history and physical, agree with same  Physical Examination: BP 125/66 mmHg  Pulse 58  Temp(Src) 98.5 F (36.9 C) (Oral)  Resp 19  Ht 5\' 9"  (1.753 m)  Wt 82.101 kg (181 lb)  BMI 26.72 kg/m2  SpO2 100%  LMP  (LMP Unknown) Gen: 81 year old female is comfortable with moving in her back no other distress. HEENT: Normocephalic atraumatic eyes are anicteric Neck: No JVD supple Chest: Clear to auscultation bilaterally CV: Regular rate and rhythm without rub or gallop Abd: Soft nontender nondistended bowel sounds positive and the right upper quadrant. There is a masslike effect in the right upper quadrant hospital he a firm edge of her liver. Review of CT scan shows no evidence of a mass. Ext: No clubbing or cyanosis Skin: Other:  Data: Lab Results  Component Value Date   WBC 9.0 09/18/2015   HGB 13.0 09/18/2015   HCT 38.5 09/18/2015   MCV 92.7 09/18/2015   PLT 186 09/18/2015    Recent Labs Lab 09/15/15 1126 09/15/15 1840 09/18/15 0557  HGB 13.2 13.7 13.0   Lab Results  Component Value Date   NA 140 09/18/2015   K 4.1 09/18/2015   CL 105 09/18/2015   CO2 29 09/18/2015   BUN 11 09/18/2015   CREATININE 0.74 09/18/2015   Lab Results  Component Value Date   ALT 28 09/15/2015   AST 35 09/15/2015    ALKPHOS 64 09/15/2015   BILITOT 0.3 09/15/2015   No results for input(s): APTT, INR, PTT in the last 168 hours. CBC Latest Ref Rng 09/18/2015 09/15/2015 09/15/2015  WBC 3.6 - 11.0 K/uL 9.0 12.4(H) 11.3(H)  Hemoglobin 12.0 - 16.0 g/dL 13.0 13.7 13.2  Hematocrit 35.0 - 47.0 % 38.5 41.5 40.8  Platelets 150 - 440 K/uL 186 227 208    STUDIES: No results found. @IMAGES @  Assessment: 1. Dilated bile ducts. There is no laboratory evidence of biliary obstruction. She does have a history of remote  cholecystectomy as well as being 81 years old. Both of these would contribute toward increased dimension of the common bile duct particularly, though less likely to affect the P duct and intrahepatics. 2. Marked back pain in the setting of a fall. Back MRI results pending. 3. Chronic constipation, although patient states that she is incontinent with stool leakage.  Recommendations: 1. Agree with MRCP. Recheck LFTs tomorrow morning. 2. Recommend orthopedic consult in regards to her back injury and pain. Concern particularly for possible thoracic vertebral fracture. 3. Following   Thank you for the consult. Please call with questions or concerns.  Lollie Sails, MD  09/18/2015 6:36 PM

## 2015-09-18 NOTE — Progress Notes (Signed)
[facility] [place] NAME: Jessica Erickson    MR#:  [id number]  DATE OF BIRTH:  1926/04/02  SUBJECTIVE:  CHIEF COMPLAINT:   Chief Complaint  Patient presents with  . Emesis   Patient presented here due to generalized weakness, nausea and vomiting. Admits of fall and significant back pain due to fall , radiating around her chest . PO intake is still poor.  + nausea but no vomiting.  .  Seen by PT and they recommend SNF/STR.  CT scan of abdomen and pelvis revealed anterior subluxation of L4-L5 as well as compression deformity of T12, concerning for acute  REVIEW OF SYSTEMS:    Review of Systems  Constitutional: Negative for fever and chills.  HENT: Negative for congestion and tinnitus.   Eyes: Negative for blurred vision and double vision.  Respiratory: Negative for cough, shortness of breath and wheezing.   Cardiovascular: Negative for chest pain, orthopnea and PND.  Gastrointestinal: Positive for nausea and constipation. Negative for vomiting, abdominal pain and diarrhea.  Genitourinary: Negative for dysuria and hematuria.  Neurological: Positive for weakness. Negative for dizziness, sensory change and focal weakness.  All other systems reviewed and are negative.   Nutrition: Heart healthy Tolerating Diet: Poor intake Tolerating PT: Evaluation noted   DRUG ALLERGIES:   Allergies  Allergen Reactions  . Aspirin     Had one but baby aspirin does not seem to bother her.  . Penicillins   . Sulfa Antibiotics     VITALS:  Blood pressure 184/78, pulse 66, temperature 98.6 F (37 C), temperature source Oral, resp. rate 17, height 5\' 9"  (1.753 m), weight 82.101 kg (181 lb), SpO2 95 %.  PHYSICAL EXAMINATION:   Physical Exam  GENERAL:  81 y.o.-year-old patient lying in the bed in no acute distress.  EYES: Pupils equal, round, reactive to light and accommodation. No scleral icterus. Extraocular muscles intact.  HEENT: Head  atraumatic, normocephalic. Oropharynx and nasopharynx clear.  NECK:  Supple, no jugular venous distention. No thyroid enlargement, no tenderness.  LUNGS: Normal breath sounds bilaterally, no wheezing, rales, rhonchi. No use of accessory muscles of respiration.  CARDIOVASCULAR: S1, S2 normal. No murmurs, rubs, or gallops.  ABDOMEN: Soft, nontender, nondistended. Bowel sounds present. No organomegaly or mass.  EXTREMITIES: No cyanosis, clubbing or edema b/l.    NEUROLOGIC: Cranial nerves II through XII are intact. No focal Motor or sensory deficits b/l. Globally weak PSYCHIATRIC: The patient is alert and oriented x 3. Good affect SKIN: No obvious rash, lesion, or ulcer.    LABORATORY PANEL:   CBC  Recent Labs Lab 09/18/15 0557  WBC 9.0  HGB 13.0  HCT 38.5  PLT 186   ------------------------------------------------------------------------------------------------------------------  Chemistries   Recent Labs Lab 09/15/15 1840 09/18/15 0557  NA 140 140  K 4.4 4.1  CL 104 105  CO2 28 29  GLUCOSE 148* 126*  BUN 16 11  CREATININE 0.77 0.74  CALCIUM 8.9 8.3*  AST 35  --   ALT 28  --   ALKPHOS 64  --   BILITOT 0.3  --    ------------------------------------------------------------------------------------------------------------------  Cardiac Enzymes No results for input(s): TROPONINI in the last 168 hours. ------------------------------------------------------------------------------------------------------------------  RADIOLOGY:  No results found.   ASSESSMENT AND PLAN:   81 year old female with past medical history of trigeminal neuralgia, diabetes, osteoarthritis, neuropathy, hypertension, hyperlipidemia, hypothyroidism who presents to the hospital due to generalized weakness and also nausea and vomiting.  1. Generalized weakness, unsteady gait  suspected due to increased use Tegretol for her Trigeminal neuralgia.  - Patient reports her Tegretol was increased by  her neurologist at [facility]. Denies any focal weakness. CT head negative for stroke.  Cont. Lower dose Tegretol and monitor.  - seen by PT and they recommend SNF and social worked made aware.    2. Nausea vomiting. Etiology unclear. Appears viral syndrome versus side effects from Tegretol - Patient also had abdominal CT scan with dilated intra-and extrahepatic bile ducts. Although patient's LFTs are normal, we'll get MRCP to rule out obstruction.   - Get GI consult   - cont. To encourage PO intake. ?? Constipation and added Lactulose to MOM and monitor. Patient had 3 bowel movements yesterday  - cont. Supportive care w/ anti-emetics for now.     3. Hypertension - cont. Clonidine, Lisinopril. Blood pressure is still poorly controlled  4. History of trigeminal neurolagia - cont. Lower dose Tegretol.   5. Hypoxemia - transient and resolved now.  - likely related to underlying COPD given her smoking hx.  Likely needs PFT's as outpatient.   - Chest x-ray negative for pneumonia.  6. DM - cont. Metformin.  SSI. Follow BS.    7. Hypothyroidism - cont. Synthroid.    8. Hyperlipidemia-  Cont. Atorvastatin.  9. Low back pain, get MRI of lumbar spine to rule out compression in T12, and other acute spinal abnormalities   All the records are reviewed and case discussed with Care Management/Social Workerr. Management plans discussed with the patient, family and they are in agreement.  CODE STATUS: Full  DVT Prophylaxis: Lovenox  TOTAL TIME TAKING CARE OF THIS PATIENT: 40 minutes.   POSSIBLE D/C IN 1-2 DAYS, DEPENDING ON CLINICAL CONDITION.   Theodoro Grist M.D on 09/18/2015 at 1:01 PM  Between 7am to 6pm - Pager - [phone]  After 6pm go to [email or web address] - password [email or web address] [place] [facility]  Office  [phone]  CC: Primary care physician; Volanda Napoleon, MD

## 2015-09-18 NOTE — Discharge Summary (Signed)
[place] [facility] [place] NAME: Jessica Erickson    MR#:  [id number]  DATE OF BIRTH:  02-17-1926  DATE OF ADMISSION:  09/15/2015 ADMITTING PHYSICIAN: Fritzi Mandes, MD  DATE OF DISCHARGE: 09/18/2015  PRIMARY CARE PHYSICIAN: Volanda Napoleon, MD    ADMISSION DIAGNOSIS:  Generalized pain [R52] Weakness [R53.1] Hypoxia [R09.02] Non-intractable vomiting with nausea, vomiting of unspecified type [R11.2]  DISCHARGE DIAGNOSIS:  Active Problems:   Vomiting   SECONDARY DIAGNOSIS:   Past Medical History  Diagnosis Date  . History of kidney problems   . Diabetes ([place])   . Asthma   . Osteoarthritis   . Bilateral swelling of feet     and legs  . Neuropathy [facility])     HOSPITAL COURSE:   81 year old female with past medical history of trigeminal neuralgia, diabetes, osteoarthritis, neuropathy, hypertension, hyperlipidemia, hypothyroidism who presents to the hospital due to generalized weakness and also nausea and vomiting.  1. Generalized weakness, unsteady gait suspected due to increased use Tegretol for her Trigeminal neuralgia.  - Patient reports her Tegretol was increased by her neurologist at [facility].  CT head negative for stroke. Cont. Lower dose Tegretol and monitor.  - seen by PT and they recommend SNF  2. Low back pain-patient had a fall 3 days ago and has been having significant lower back pain. CT of the abdomen and pelvis done on admission which showed a possible compression fracture. -A MRI of the thoracic and lumbar spine was obtained today. The MRI findings show Acute/subacute compression fracture T12 with retropulsion of bone into the canal. There is a relatively large epidural hematoma posteriorly extending from T11 through L1 causing cord compression. -Patient needs urgent neurosurgical evaluation. I discussed with the patient's son who preferred transferred to [facility]. He called to [facility] transfer center and spoke to  Dr. Gilmer Mor from neurosurgery and he has accepted the patient's transfer. -He did not recommend restarting any IV steroids at this point.  3. Nausea vomiting. Etiology unclear. Appears viral syndrome versus side effects from Tegretol - Patient also had abdominal CT scan with dilated intra-and extrahepatic bile ducts. Although patient's LFTs are normal -Gastroenterology has been consult. And he recommended MRCP which has yet to be done. Is confirmed to be followed after transfer. - cont. Supportive care w/ anti-emetics for now.   4. Hypertension - cont. Clonidine, Lisinopril.  -Poorly controlled due to significant pain. Once her pain is better controlled I think her hypertension would improve  5. History of trigeminal neurolagia - cont. Lower dose Tegretol.   6. Hypoxemia - transient and resolved now.  - likely related to underlying COPD given her smoking hx. Likely needs PFT's as outpatient.  - Chest x-ray negative for pneumonia.  7. DM - cont. Metformin. SSI. - BS stable.    8. Hypothyroidism - cont. Synthroid.   9. Hyperlipidemia- Cont. Atorvastatin.    Patient was recently being transferred to [facility] for neurosurgical evaluation given her MRI findings as mentioned above.  DISCHARGE CONDITIONS:   Stable  CONSULTS OBTAINED:     DRUG ALLERGIES:   Allergies  Allergen Reactions  . Aspirin     Had one but baby aspirin does not seem to bother her.  . Penicillins   . Sulfa Antibiotics     DISCHARGE MEDICATIONS:   Current Discharge Medication List    START taking these medications   Details  lisinopril (PRINIVIL,ZESTRIL) 10 MG tablet Take 1 tablet (10 mg total) by mouth  daily.      CONTINUE these medications which have NOT CHANGED   Details  acetaminophen (TYLENOL) 325 MG tablet Take 2 tablets (650 mg total) by mouth every 6 (six) hours as needed. Qty: 60 tablet, Refills: 0    atorvastatin (LIPITOR) 40 MG tablet Take 40 mg by mouth daily.     carbamazepine (TEGRETOL XR) 200 MG 12 hr tablet Take 200 mg by mouth 2 (two) times daily.     cloNIDine (CATAPRES) 0.1 MG tablet Take 0.1 mg by mouth daily.     Diphenhydramine-APAP, sleep, (TYLENOL PM EXTRA STRENGTH PO) Take by mouth at bedtime.    fosinopril (MONOPRIL) 10 MG tablet Take 10 mg by mouth daily.    levothyroxine (LEVOXYL) 150 MCG tablet Take 150 mcg by mouth daily before breakfast.    metFORMIN (GLUCOPHAGE) 500 MG tablet Take by mouth 2 (two) times daily with a meal.    Multiple Vitamin (MULTIVITAMIN) tablet Take 1 tablet by mouth daily.    oxyCODONE (ROXICODONE) 5 MG immediate release tablet Take 0.5 tablets (2.5 mg total) by mouth every 6 (six) hours as needed for breakthrough pain. Qty: 8 tablet, Refills: 0      STOP taking these medications     ASPIRIN LOW DOSE 81 MG EC tablet          DISCHARGE INSTRUCTIONS:   DIET:  Cardiac diet and Diabetic diet  DISCHARGE CONDITION:  Stable  ACTIVITY:  Bedrest  OXYGEN:  Home Oxygen: No.   Oxygen Delivery: room air  DISCHARGE LOCATION:  [facility]   If you experience worsening of your admission symptoms, develop shortness of breath, life threatening emergency, suicidal or homicidal thoughts you must seek medical attention immediately by calling 911 or calling your MD immediately  if symptoms less severe.  You Must read complete instructions/literature along with all the possible adverse reactions/side effects for all the Medicines you take and that have been prescribed to you. Take any new Medicines after you have completely understood and accpet all the possible adverse reactions/side effects.   Please note  You were cared for by a hospitalist during your hospital stay. If you have any questions about your discharge medications or the care you received while you were in the hospital after you are discharged, you can call the unit and asked to speak with the hospitalist on call if the hospitalist that took care of  you is not available. Once you are discharged, your primary care physician will handle any further medical issues. Please note that NO REFILLS for any discharge medications will be authorized once you are discharged, as it is imperative that you return to your primary care physician (or establish a relationship with a primary care physician if you do not have one) for your aftercare needs so that they can reassess your need for medications and monitor your lab values.   CBC  Recent Labs Lab 09/18/15 0557  WBC 9.0  HGB 13.0  HCT 38.5  PLT 186    Chemistries   Recent Labs Lab 09/15/15 1840 09/18/15 0557  NA 140 140  K 4.4 4.1  CL 104 105  CO2 28 29  GLUCOSE 148* 126*  BUN 16 11  CREATININE 0.77 0.74  CALCIUM 8.9 8.3*  AST 35  --   ALT 28  --   ALKPHOS 64  --   BILITOT 0.3  --     Cardiac Enzymes No results for input(s): TROPONINI in the last 168 hours.  Microbiology Results  No results found for this or any previous visit.  RADIOLOGY:  Mr Lumbar Spine Wo Contrast  09/18/2015  ADDENDUM REPORT: 09/18/2015 19:58 ADDENDUM: Critical Value/emergent results were called by telephone at the time of interpretation on 09/18/2015 at 7:57 pm to Dr. Verdell Carmine , who verbally acknowledged these results. Electronically Signed   By: Franchot Gallo M.D.   On: 09/18/2015 19:58  09/18/2015  CLINICAL DATA:  Severe back pain.  Fall 3 days ago. EXAM: MRI LUMBAR SPINE WITHOUT CONTRAST TECHNIQUE: Multiplanar, multisequence MR imaging of the lumbar spine was performed. No intravenous contrast was administered. COMPARISON:  CT abdomen pelvis 09/15/2015 FINDINGS: Moderate compression fracture T12 with bone marrow edema. This appears acute or subacute. Retropulsion of bone into the canal touching the cord. There is a large epidural hematoma in the posterior spinal canal. This extends to T11 and may extend above T11. The hematoma extends down to the L1. There is compression of the cord and severe spinal  stenosis due to bony retropulsion and the large hematoma. Hematoma presumably related to the recent fracture. The cord is compressed at T11 and T12. Scanning not performed above T11. L1-2: Disc degeneration and a large osteophyte on the left. There is left foraminal encroachment. There is spinal stenosis on the left side but not the right. There is mild facet hypertrophy. L2-3: Mild retrolisthesis. Moderate disc degeneration and spurring. Bilateral facet hypertrophy. Moderate spinal stenosis. Severe right foraminal encroachment and moderate left foraminal encroachment. L3-4: Mild retrolisthesis. Disc degeneration and spurring and bilateral facet hypertrophy. Mild spinal stenosis. Severe right foraminal encroachment due to spurring. L4-5: 4 mm anterior slip. Disc degeneration and disc bulging. Moderate facet hypertrophy. Moderate spinal stenosis. Moderate left foraminal encroachment L5-S1: Disc bulging. Bilateral facet hypertrophy. Moderate left foraminal narrowing. IMPRESSION: Acute/subacute compression fracture T12 with retropulsion of bone into the canal. There is a relatively large epidural hematoma posteriorly extending from T11 through L1 causing cord compression. The hematoma could extend above T11 but this area was not scanned. No cord signal abnormality. Extensive chronic degenerative changes throughout the lumbar spine as above. Multilevel spinal and foraminal stenosis. Electronically Signed: By: Franchot Gallo M.D. On: 09/18/2015 19:43      Management plans discussed with the patient, family and they are in agreement.  CODE STATUS:     Code Status Orders        Start     Ordered   09/16/15 0234  Full code   Continuous     09/16/15 0233    Advance Directive Documentation        Most Recent Value   Type of Advance Directive  Living will   Pre-existing out of facility DNR order (yellow form or pink MOST form)     "MOST" Form in Place?        TOTAL TIME TAKING CARE OF THIS PATIENT: 50  minutes.     Henreitta Leber M.D on 09/18/2015 at 8:37 PM  Between 7am to 6pm - Pager - [phone]  After 6pm go to [email or web address] - password [email or web address] [place] [facility]  Office  [phone]  CC: Primary care physician; Volanda Napoleon, MD

## 2015-09-20 DIAGNOSIS — M4854XA Collapsed vertebra, not elsewhere classified, thoracic region, initial encounter for fracture: Secondary | ICD-10-CM | POA: Insufficient documentation

## 2015-09-20 NOTE — Progress Notes (Signed)
Clinical Education officer, museum (CSW) updated Science writer at [place] that patient transferred to [place], [place] [phone]

## 2015-09-24 ENCOUNTER — Encounter
Admission: RE | Admit: 2015-09-24 | Discharge: 2015-09-24 | Disposition: A | Discharge status: Home / Self Care | Payer: Medicare PPO | Source: Ambulatory Visit | Attending: Internal Medicine | Admitting: Internal Medicine

## 2015-09-24 DIAGNOSIS — E119 Type 2 diabetes mellitus without complications: Secondary | ICD-10-CM | POA: Insufficient documentation

## 2015-09-24 DIAGNOSIS — Z794 Long term (current) use of insulin: Secondary | ICD-10-CM | POA: Insufficient documentation

## 2015-09-27 DIAGNOSIS — E119 Type 2 diabetes mellitus without complications: Secondary | ICD-10-CM | POA: Diagnosis not present

## 2015-09-27 DIAGNOSIS — Z794 Long term (current) use of insulin: Secondary | ICD-10-CM | POA: Diagnosis not present

## 2015-09-27 LAB — GLUCOSE, CAPILLARY
Glucose-Capillary: 91 mg/dL (ref 65–99)
Glucose-Capillary: 128 mg/dL — ABNORMAL HIGH (ref 65–99)

## 2015-09-28 DIAGNOSIS — E119 Type 2 diabetes mellitus without complications: Secondary | ICD-10-CM | POA: Diagnosis not present

## 2015-09-28 LAB — GLUCOSE, CAPILLARY
Glucose-Capillary: 122 mg/dL — ABNORMAL HIGH (ref 65–99)
Glucose-Capillary: 134 mg/dL — ABNORMAL HIGH (ref 65–99)
Glucose-Capillary: 110 mg/dL — ABNORMAL HIGH (ref 65–99)
Glucose-Capillary: 136 mg/dL — ABNORMAL HIGH (ref 65–99)

## 2015-09-29 DIAGNOSIS — E119 Type 2 diabetes mellitus without complications: Secondary | ICD-10-CM | POA: Diagnosis not present

## 2015-09-29 LAB — GLUCOSE, CAPILLARY
Glucose-Capillary: 101 mg/dL — ABNORMAL HIGH (ref 65–99)
Glucose-Capillary: 118 mg/dL — ABNORMAL HIGH (ref 65–99)
Glucose-Capillary: 108 mg/dL — ABNORMAL HIGH (ref 65–99)

## 2015-09-30 DIAGNOSIS — E119 Type 2 diabetes mellitus without complications: Secondary | ICD-10-CM | POA: Diagnosis not present

## 2015-09-30 LAB — GLUCOSE, CAPILLARY
Glucose-Capillary: 117 mg/dL — ABNORMAL HIGH (ref 65–99)
Glucose-Capillary: 138 mg/dL — ABNORMAL HIGH (ref 65–99)

## 2015-10-01 DIAGNOSIS — E119 Type 2 diabetes mellitus without complications: Secondary | ICD-10-CM | POA: Diagnosis not present

## 2015-10-01 LAB — GLUCOSE, CAPILLARY
Glucose-Capillary: 100 mg/dL — ABNORMAL HIGH (ref 65–99)
Glucose-Capillary: 119 mg/dL — ABNORMAL HIGH (ref 65–99)
Glucose-Capillary: 156 mg/dL — ABNORMAL HIGH (ref 65–99)
Glucose-Capillary: 105 mg/dL — ABNORMAL HIGH (ref 65–99)
Glucose-Capillary: 126 mg/dL — ABNORMAL HIGH (ref 65–99)

## 2015-10-02 DIAGNOSIS — E119 Type 2 diabetes mellitus without complications: Secondary | ICD-10-CM | POA: Diagnosis not present

## 2015-10-02 LAB — GLUCOSE, CAPILLARY
Glucose-Capillary: 121 mg/dL — ABNORMAL HIGH (ref 65–99)
Glucose-Capillary: 135 mg/dL — ABNORMAL HIGH (ref 65–99)
Glucose-Capillary: 96 mg/dL (ref 65–99)
Glucose-Capillary: 121 mg/dL — ABNORMAL HIGH (ref 65–99)

## 2015-10-03 DIAGNOSIS — E119 Type 2 diabetes mellitus without complications: Secondary | ICD-10-CM | POA: Diagnosis not present

## 2015-10-03 LAB — GLUCOSE, CAPILLARY
Glucose-Capillary: 100 mg/dL — ABNORMAL HIGH (ref 65–99)
Glucose-Capillary: 118 mg/dL — ABNORMAL HIGH (ref 65–99)
Glucose-Capillary: 99 mg/dL (ref 65–99)
Glucose-Capillary: 120 mg/dL — ABNORMAL HIGH (ref 65–99)

## 2015-10-04 DIAGNOSIS — E119 Type 2 diabetes mellitus without complications: Secondary | ICD-10-CM | POA: Diagnosis not present

## 2015-10-04 LAB — GLUCOSE, CAPILLARY
Glucose-Capillary: 115 mg/dL — ABNORMAL HIGH (ref 65–99)
Glucose-Capillary: 132 mg/dL — ABNORMAL HIGH (ref 65–99)
Glucose-Capillary: 97 mg/dL (ref 65–99)
Glucose-Capillary: 132 mg/dL — ABNORMAL HIGH (ref 65–99)

## 2015-10-05 ENCOUNTER — Encounter
Admission: RE | Admit: 2015-10-05 | Discharge: 2015-10-05 | Disposition: A | Discharge status: Home / Self Care | Payer: Medicare PPO | Source: Ambulatory Visit | Attending: Internal Medicine | Admitting: Internal Medicine

## 2015-10-05 DIAGNOSIS — I82409 Acute embolism and thrombosis of unspecified deep veins of unspecified lower extremity: Secondary | ICD-10-CM | POA: Diagnosis not present

## 2015-10-05 DIAGNOSIS — Z794 Long term (current) use of insulin: Secondary | ICD-10-CM | POA: Insufficient documentation

## 2015-10-05 DIAGNOSIS — D649 Anemia, unspecified: Secondary | ICD-10-CM | POA: Insufficient documentation

## 2015-10-05 DIAGNOSIS — E119 Type 2 diabetes mellitus without complications: Secondary | ICD-10-CM | POA: Insufficient documentation

## 2015-10-06 LAB — GLUCOSE, CAPILLARY
Glucose-Capillary: 114 mg/dL — ABNORMAL HIGH (ref 65–99)
Glucose-Capillary: 151 mg/dL — ABNORMAL HIGH (ref 65–99)
Glucose-Capillary: 109 mg/dL — ABNORMAL HIGH (ref 65–99)
Glucose-Capillary: 104 mg/dL — ABNORMAL HIGH (ref 65–99)
Glucose-Capillary: 126 mg/dL — ABNORMAL HIGH (ref 65–99)
Glucose-Capillary: 100 mg/dL — ABNORMAL HIGH (ref 65–99)
Glucose-Capillary: 147 mg/dL — ABNORMAL HIGH (ref 65–99)

## 2015-10-07 DIAGNOSIS — I82409 Acute embolism and thrombosis of unspecified deep veins of unspecified lower extremity: Secondary | ICD-10-CM | POA: Diagnosis not present

## 2015-10-07 LAB — GLUCOSE, CAPILLARY
Glucose-Capillary: 99 mg/dL (ref 65–99)
Glucose-Capillary: 160 mg/dL — ABNORMAL HIGH (ref 65–99)
Glucose-Capillary: 96 mg/dL (ref 65–99)
Glucose-Capillary: 169 mg/dL — ABNORMAL HIGH (ref 65–99)

## 2015-10-08 ENCOUNTER — Encounter: Payer: Self-pay | Admitting: Emergency Medicine

## 2015-10-08 ENCOUNTER — Emergency Department: Payer: Medicare PPO

## 2015-10-08 ENCOUNTER — Inpatient Hospital Stay: Payer: Medicare PPO

## 2015-10-08 ENCOUNTER — Inpatient Hospital Stay
Admission: EM | Admit: 2015-10-08 | Discharge: 2015-10-13 | DRG: 167 | Disposition: A | Discharge status: Skilled Nursing Facility | Payer: Medicare PPO | Attending: Internal Medicine | Admitting: Internal Medicine

## 2015-10-08 DIAGNOSIS — Z882 Allergy status to sulfonamides status: Secondary | ICD-10-CM

## 2015-10-08 DIAGNOSIS — Z87440 Personal history of urinary (tract) infections: Secondary | ICD-10-CM | POA: Diagnosis not present

## 2015-10-08 DIAGNOSIS — J449 Chronic obstructive pulmonary disease, unspecified: Secondary | ICD-10-CM | POA: Diagnosis present

## 2015-10-08 DIAGNOSIS — R042 Hemoptysis: Secondary | ICD-10-CM | POA: Diagnosis present

## 2015-10-08 DIAGNOSIS — I82431 Acute embolism and thrombosis of right popliteal vein: Secondary | ICD-10-CM | POA: Diagnosis present

## 2015-10-08 DIAGNOSIS — I10 Essential (primary) hypertension: Secondary | ICD-10-CM | POA: Diagnosis present

## 2015-10-08 DIAGNOSIS — Z833 Family history of diabetes mellitus: Secondary | ICD-10-CM

## 2015-10-08 DIAGNOSIS — Z79899 Other long term (current) drug therapy: Secondary | ICD-10-CM | POA: Diagnosis not present

## 2015-10-08 DIAGNOSIS — M549 Dorsalgia, unspecified: Secondary | ICD-10-CM | POA: Diagnosis present

## 2015-10-08 DIAGNOSIS — I82401 Acute embolism and thrombosis of unspecified deep veins of right lower extremity: Secondary | ICD-10-CM

## 2015-10-08 DIAGNOSIS — Z886 Allergy status to analgesic agent status: Secondary | ICD-10-CM | POA: Diagnosis not present

## 2015-10-08 DIAGNOSIS — M545 Low back pain, unspecified: Secondary | ICD-10-CM

## 2015-10-08 DIAGNOSIS — G500 Trigeminal neuralgia: Secondary | ICD-10-CM | POA: Diagnosis present

## 2015-10-08 DIAGNOSIS — A419 Sepsis, unspecified organism: Secondary | ICD-10-CM

## 2015-10-08 DIAGNOSIS — I82411 Acute embolism and thrombosis of right femoral vein: Secondary | ICD-10-CM | POA: Diagnosis present

## 2015-10-08 DIAGNOSIS — Z7984 Long term (current) use of oral hypoglycemic drugs: Secondary | ICD-10-CM

## 2015-10-08 DIAGNOSIS — I82409 Acute embolism and thrombosis of unspecified deep veins of unspecified lower extremity: Secondary | ICD-10-CM | POA: Diagnosis present

## 2015-10-08 DIAGNOSIS — J189 Pneumonia, unspecified organism: Secondary | ICD-10-CM

## 2015-10-08 DIAGNOSIS — Z880 Allergy status to penicillin: Secondary | ICD-10-CM | POA: Diagnosis not present

## 2015-10-08 DIAGNOSIS — I272 Other secondary pulmonary hypertension: Secondary | ICD-10-CM | POA: Diagnosis present

## 2015-10-08 DIAGNOSIS — I2699 Other pulmonary embolism without acute cor pulmonale: Secondary | ICD-10-CM

## 2015-10-08 DIAGNOSIS — J45909 Unspecified asthma, uncomplicated: Secondary | ICD-10-CM | POA: Diagnosis present

## 2015-10-08 DIAGNOSIS — R0902 Hypoxemia: Secondary | ICD-10-CM | POA: Diagnosis present

## 2015-10-08 DIAGNOSIS — N390 Urinary tract infection, site not specified: Secondary | ICD-10-CM | POA: Diagnosis present

## 2015-10-08 DIAGNOSIS — B964 Proteus (mirabilis) (morganii) as the cause of diseases classified elsewhere: Secondary | ICD-10-CM | POA: Diagnosis present

## 2015-10-08 DIAGNOSIS — J849 Interstitial pulmonary disease, unspecified: Secondary | ICD-10-CM | POA: Diagnosis present

## 2015-10-08 DIAGNOSIS — B9620 Unspecified Escherichia coli [E. coli] as the cause of diseases classified elsewhere: Secondary | ICD-10-CM | POA: Diagnosis present

## 2015-10-08 DIAGNOSIS — E1142 Type 2 diabetes mellitus with diabetic polyneuropathy: Secondary | ICD-10-CM | POA: Diagnosis present

## 2015-10-08 DIAGNOSIS — G629 Polyneuropathy, unspecified: Secondary | ICD-10-CM | POA: Diagnosis present

## 2015-10-08 DIAGNOSIS — E785 Hyperlipidemia, unspecified: Secondary | ICD-10-CM | POA: Diagnosis present

## 2015-10-08 DIAGNOSIS — E119 Type 2 diabetes mellitus without complications: Secondary | ICD-10-CM | POA: Diagnosis not present

## 2015-10-08 DIAGNOSIS — D649 Anemia, unspecified: Secondary | ICD-10-CM | POA: Diagnosis present

## 2015-10-08 DIAGNOSIS — Z794 Long term (current) use of insulin: Secondary | ICD-10-CM | POA: Diagnosis not present

## 2015-10-08 DIAGNOSIS — M1990 Unspecified osteoarthritis, unspecified site: Secondary | ICD-10-CM | POA: Diagnosis present

## 2015-10-08 LAB — GLUCOSE, CAPILLARY
Glucose-Capillary: 124 mg/dL — ABNORMAL HIGH (ref 65–99)
Glucose-Capillary: 133 mg/dL — ABNORMAL HIGH (ref 65–99)

## 2015-10-08 LAB — CBC WITH DIFFERENTIAL/PLATELET
Basophils Absolute: 0 10*3/uL (ref 0–0.1)
Basophils Relative: 0 %
Eosinophils Absolute: 0 10*3/uL (ref 0–0.7)
Eosinophils Relative: 0 %
HCT: 38 % (ref 35.0–47.0)
Hemoglobin: 12.4 g/dL (ref 12.0–16.0)
Lymphocytes Relative: 11 %
Lymphs Abs: 1.4 10*3/uL (ref 1.0–3.6)
MCH: 30.3 pg (ref 26.0–34.0)
MCHC: 32.8 g/dL (ref 32.0–36.0)
MCV: 92.4 fL (ref 80.0–100.0)
Monocytes Absolute: 1.3 10*3/uL — ABNORMAL HIGH (ref 0.2–0.9)
Monocytes Relative: 10 %
Neutro Abs: 9.6 10*3/uL — ABNORMAL HIGH (ref 1.4–6.5)
Neutrophils Relative %: 79 %
Platelets: 180 10*3/uL (ref 150–440)
RBC: 4.11 MIL/uL (ref 3.80–5.20)
RDW: 13.8 % (ref 11.5–14.5)
WBC: 12.4 10*3/uL — ABNORMAL HIGH (ref 3.6–11.0)

## 2015-10-08 LAB — COMPREHENSIVE METABOLIC PANEL
ALT: 14 U/L (ref 14–54)
AST: 18 U/L (ref 15–41)
Albumin: 2.9 g/dL — ABNORMAL LOW (ref 3.5–5.0)
Alkaline Phosphatase: 100 U/L (ref 38–126)
Anion gap: 10 (ref 5–15)
BUN: 13 mg/dL (ref 6–20)
CO2: 28 mmol/L (ref 22–32)
Calcium: 8.7 mg/dL — ABNORMAL LOW (ref 8.9–10.3)
Chloride: 97 mmol/L — ABNORMAL LOW (ref 101–111)
Creatinine, Ser: 0.9 mg/dL (ref 0.44–1.00)
GFR calc Af Amer: >60 mL/min (ref >60)
GFR calc non Af Amer: 56 mL/min — ABNORMAL LOW (ref >60)
Glucose, Bld: 151 mg/dL — ABNORMAL HIGH (ref 65–99)
Potassium: 4.3 mmol/L (ref 3.5–5.1)
Sodium: 135 mmol/L (ref 135–145)
Total Bilirubin: 0.5 mg/dL (ref 0.3–1.2)
Total Protein: 6.9 g/dL (ref 6.5–8.1)

## 2015-10-08 LAB — INFLUENZA PANEL BY PCR (TYPE A & B)
H1N1 flu by pcr: NOT DETECTED
Influenza A By PCR: NEGATIVE
Influenza B By PCR: NEGATIVE

## 2015-10-08 LAB — LACTIC ACID, PLASMA
Lactic Acid, Venous: 1.3 mmol/L (ref 0.5–2.0)
Lactic Acid, Venous: 0.9 mmol/L (ref 0.5–2.0)

## 2015-10-08 LAB — URINALYSIS COMPLETE WITH MICROSCOPIC (ARMC ONLY)
Bacteria, UA: NONE SEEN
Bilirubin Urine: NEGATIVE
Glucose, UA: NEGATIVE mg/dL
Hgb urine dipstick: NEGATIVE
Ketones, ur: NEGATIVE mg/dL
Nitrite: NEGATIVE
Protein, ur: 30 mg/dL — AB
Specific Gravity, Urine: 1.021 (ref 1.005–1.030)
pH: 6 (ref 5.0–8.0)

## 2015-10-08 LAB — TROPONIN I: Troponin I: <0.03 ng/mL (ref <0.031)

## 2015-10-08 MED ORDER — OXYCODONE HCL 5 MG PO TABS
5.0000 mg | ORAL_TABLET | ORAL | Status: DC | PRN
  Administered 2015-10-13: 5 mg via ORAL
  Filled 2015-10-08 (×2): qty 1

## 2015-10-08 MED ORDER — AZTREONAM 2 G IJ SOLR
2.0000 g | INTRAMUSCULAR | Status: AC
  Administered 2015-10-08: 2 g via INTRAVENOUS
  Filled 2015-10-08: qty 2

## 2015-10-08 MED ORDER — ZOLPIDEM TARTRATE 5 MG PO TABS
5.0000 mg | ORAL_TABLET | Freq: Every evening | ORAL | Status: DC | PRN
  Administered 2015-10-08 – 2015-10-12 (×5): 5 mg via ORAL
  Filled 2015-10-08 (×5): qty 1

## 2015-10-08 MED ORDER — TIOTROPIUM BROMIDE MONOHYDRATE 18 MCG IN CAPS
18.0000 ug | ORAL_CAPSULE | Freq: Every day | RESPIRATORY_TRACT | Status: DC
  Administered 2015-10-09 – 2015-10-13 (×5): 18 ug via RESPIRATORY_TRACT
  Filled 2015-10-08: qty 5

## 2015-10-08 MED ORDER — SODIUM CHLORIDE 0.9 % IJ SOLN
3.0000 mL | Freq: Two times a day (BID) | INTRAMUSCULAR | Status: DC
  Administered 2015-10-08 – 2015-10-11 (×6): 3 mL via INTRAVENOUS

## 2015-10-08 MED ORDER — SODIUM CHLORIDE 0.9 % IV BOLUS (SEPSIS)
1463.0000 mL | Freq: Once | INTRAVENOUS | Status: AC
Start: 2015-10-08 — End: 2015-10-08
  Administered 2015-10-08: 1463 mL via INTRAVENOUS

## 2015-10-08 MED ORDER — VANCOMYCIN HCL IN DEXTROSE 1-5 GM/200ML-% IV SOLN
1000.0000 mg | Freq: Once | INTRAVENOUS | Status: AC
  Administered 2015-10-08: 1000 mg via INTRAVENOUS
  Filled 2015-10-08: qty 200

## 2015-10-08 MED ORDER — AZTREONAM 1 G IJ SOLR
1.0000 g | Freq: Three times a day (TID) | INTRAMUSCULAR | Status: DC
  Filled 2015-10-08: qty 1

## 2015-10-08 MED ORDER — SODIUM CHLORIDE 0.9 % IV BOLUS (SEPSIS)
1000.0000 mL | Freq: Once | INTRAVENOUS | Status: AC
  Administered 2015-10-08: 1000 mL via INTRAVENOUS

## 2015-10-08 MED ORDER — LEVOTHYROXINE SODIUM 75 MCG PO TABS
150.0000 ug | ORAL_TABLET | Freq: Every day | ORAL | Status: DC
  Administered 2015-10-09 – 2015-10-13 (×5): 150 ug via ORAL
  Filled 2015-10-08 (×5): qty 2

## 2015-10-08 MED ORDER — ONDANSETRON HCL 4 MG/2ML IJ SOLN: 4.0000 mg | Freq: Four times a day (QID) | INTRAMUSCULAR | Status: DC | PRN

## 2015-10-08 MED ORDER — CARBAMAZEPINE 200 MG PO TABS
200.0000 mg | ORAL_TABLET | Freq: Two times a day (BID) | ORAL | Status: DC
  Administered 2015-10-08 – 2015-10-13 (×10): 200 mg via ORAL
  Filled 2015-10-08 (×10): qty 1

## 2015-10-08 MED ORDER — ACETAMINOPHEN 650 MG RE SUPP: 650.0000 mg | Freq: Four times a day (QID) | RECTAL | Status: DC | PRN

## 2015-10-08 MED ORDER — LOPERAMIDE HCL 2 MG PO CAPS: 2.0000 mg | ORAL_CAPSULE | ORAL | Status: DC | PRN

## 2015-10-08 MED ORDER — ONDANSETRON HCL 4 MG PO TABS: 4.0000 mg | ORAL_TABLET | Freq: Four times a day (QID) | ORAL | Status: DC | PRN

## 2015-10-08 MED ORDER — ATORVASTATIN CALCIUM 20 MG PO TABS
40.0000 mg | ORAL_TABLET | Freq: Every day | ORAL | Status: DC
  Administered 2015-10-08 – 2015-10-13 (×6): 40 mg via ORAL
  Filled 2015-10-08 (×7): qty 2

## 2015-10-08 MED ORDER — ACETAMINOPHEN 325 MG PO TABS
650.0000 mg | ORAL_TABLET | Freq: Four times a day (QID) | ORAL | Status: DC | PRN
  Filled 2015-10-08: qty 2

## 2015-10-08 MED ORDER — SODIUM CHLORIDE 0.9 % IV SOLN
INTRAVENOUS | Status: DC
  Administered 2015-10-08: via INTRAVENOUS

## 2015-10-08 MED ORDER — VANCOMYCIN HCL IN DEXTROSE 1-5 GM/200ML-% IV SOLN
1000.0000 mg | INTRAVENOUS | Status: DC
  Administered 2015-10-09: 1000 mg via INTRAVENOUS
  Filled 2015-10-08 (×2): qty 200

## 2015-10-08 MED ORDER — ACETAMINOPHEN 500 MG PO TABS
1000.0000 mg | ORAL_TABLET | Freq: Once | ORAL | Status: AC
  Administered 2015-10-08: 1000 mg via ORAL
  Filled 2015-10-08: qty 2

## 2015-10-08 MED ORDER — PANTOPRAZOLE SODIUM 40 MG IV SOLR
40.0000 mg | Freq: Two times a day (BID) | INTRAVENOUS | Status: DC
  Administered 2015-10-09 – 2015-10-11 (×3): 40 mg via INTRAVENOUS
  Filled 2015-10-08 (×4): qty 40

## 2015-10-08 MED ORDER — CLONIDINE HCL 0.1 MG PO TABS
0.1000 mg | ORAL_TABLET | Freq: Every day | ORAL | Status: DC
  Administered 2015-10-08 – 2015-10-12 (×5): 0.1 mg via ORAL
  Filled 2015-10-08 (×5): qty 1

## 2015-10-08 MED ORDER — IOHEXOL 350 MG/ML SOLN
100.0000 mL | Freq: Once | INTRAVENOUS | Status: AC | PRN
  Administered 2015-10-08: 100 mL via INTRAVENOUS

## 2015-10-08 MED ORDER — AZTREONAM 1 G IJ SOLR
1.0000 g | Freq: Three times a day (TID) | INTRAMUSCULAR | Status: DC
  Administered 2015-10-09: 1 g via INTRAVENOUS
  Filled 2015-10-08 (×3): qty 1

## 2015-10-08 MED ORDER — GUAIFENESIN 100 MG/5ML PO SYRP
200.0000 mg | ORAL_SOLUTION | ORAL | Status: DC | PRN
  Filled 2015-10-08: qty 10

## 2015-10-08 MED ORDER — ACETAMINOPHEN 325 MG PO TABS
650.0000 mg | ORAL_TABLET | Freq: Four times a day (QID) | ORAL | Status: DC | PRN
  Administered 2015-10-09: 650 mg via ORAL

## 2015-10-08 MED ORDER — LISINOPRIL 10 MG PO TABS
10.0000 mg | ORAL_TABLET | Freq: Every day | ORAL | Status: DC
  Administered 2015-10-08 – 2015-10-13 (×6): 10 mg via ORAL
  Filled 2015-10-08 (×6): qty 1

## 2015-10-08 MED ORDER — DIPHENHYDRAMINE-APAP (SLEEP) 25-500 MG PO TABS: 1.0000 | ORAL_TABLET | Freq: Every evening | ORAL | Status: DC | PRN

## 2015-10-08 MED ORDER — POLYETHYLENE GLYCOL 3350 17 G PO PACK
17.0000 g | PACK | Freq: Every day | ORAL | Status: DC
  Administered 2015-10-09 – 2015-10-13 (×5): 17 g via ORAL
  Filled 2015-10-08 (×6): qty 1

## 2015-10-08 NOTE — ED Notes (Signed)
Rolla Etienne RN to inform her pt was with CT.

## 2015-10-08 NOTE — ED Notes (Signed)
Jessica Erickson NT to transport pt. to 216

## 2015-10-08 NOTE — ED Notes (Signed)
Patient transported to CT 

## 2015-10-08 NOTE — Progress Notes (Deleted)
[place] at [place] NAME: Jessica Erickson    MR#:  [id number]  DATE OF BIRTH:  02/02/1926  DATE OF ADMISSION:  10/08/2015  PRIMARY CARE PHYSICIAN: Volanda Napoleon, MD   REQUESTING/REFERRING PHYSICIAN:   CHIEF COMPLAINT:   Chief Complaint  Patient presents with  . Shortness of Breath    HISTORY OF PRESENT ILLNESS: Jessica Erickson  is a 81 y.o. female with a known history of COPD hypertension diabetes hyperlipidemia who presents to the hospital with complaints of shortness of breath. Separated patient had kyphoplasty on 09/21/2015 after fall and after that she was discharged to [facility] for rehabilitation. She does ago patient had that vigorous nausea and vomiting episode, yesterday and today however she was noted to have hemoptysis. And shortness of breath. Patient did have portable chest x-ray done in the facility which was concerning for spontaneous pneumothorax and she was sent to emergency room for further evaluation. Chest x-ray in the emergency room revealed mild left basilar  atelectasis , right lung scarring and stable chronic interstitial lung disease. Patient admits of coughing some yellow phlegm. Patient was febrile in the emergency room to 100.5. Hospitalist services conducted for admission.   PAST MEDICAL HISTORY:   Past Medical History  Diagnosis Date  . History of kidney problems   . Diabetes ([place])   . Asthma   . Osteoarthritis   . Bilateral swelling of feet     and legs  . Neuropathy ([place])     PAST SURGICAL HISTORY:  Past Surgical History  Procedure Laterality Date  . Brain tumor surgery    . Thyroid surgery    . Throat surgery    . Cholecystectomy      SOCIAL HISTORY:  Social History  Substance Use Topics  . Smoking status: Never Smoker   . Smokeless tobacco: Never Used  . Alcohol Use: No    FAMILY HISTORY:  Family History  Problem Relation Age of Onset  . Diabetes Mother     DRUG  ALLERGIES:  Allergies  Allergen Reactions  . Penicillins Shortness Of Breath, Rash and Other (See Comments)    Has patient had a PCN reaction causing immediate rash, facial/tongue/throat swelling, SOB or lightheadedness with hypotension: Yes Has patient had a PCN reaction causing severe rash involving mucus membranes or skin necrosis: No Has patient had a PCN reaction that required hospitalization No Has patient had a PCN reaction occurring within the last 10 years: No If all of the above answers are "NO", then may proceed with Cephalosporin use.  . Sulfa Antibiotics Shortness Of Breath and Rash  . Aspirin Other (See Comments)    Reaction:  Unknown     Review of Systems  Constitutional: Negative for fever, chills, weight loss and malaise/fatigue.  HENT: Negative for congestion.   Eyes: Negative for blurred vision and double vision.  Respiratory: Positive for cough, hemoptysis, sputum production and shortness of breath. Negative for wheezing.   Cardiovascular: Negative for chest pain, palpitations, orthopnea, leg swelling and PND.  Gastrointestinal: Negative for nausea, vomiting, abdominal pain, diarrhea, constipation, blood in stool and melena.  Genitourinary: Negative for dysuria, urgency, frequency and hematuria.  Musculoskeletal: Negative for falls.  Skin: Negative for rash.  Neurological: Negative for dizziness and weakness.  Psychiatric/Behavioral: Negative for depression and memory loss. The patient is not nervous/anxious.     MEDICATIONS AT HOME:  Prior to Admission medications   Medication Sig Start Date End Date Taking? Authorizing  Provider  atorvastatin (LIPITOR) 40 MG tablet Take 40 mg by mouth daily.   Yes Historical Provider, MD  Calcium Carbonate-Vitamin D (CALCIUM 600+D) 600-400 MG-UNIT tablet Take 1 tablet by mouth daily.   Yes Historical Provider, MD  carbamazepine (TEGRETOL) 200 MG tablet Take 200 mg by mouth 2 (two) times daily.   Yes Historical Provider, MD   cloNIDine (CATAPRES) 0.1 MG tablet Take 0.1 mg by mouth at bedtime.    Yes Historical Provider, MD  diphenhydramine-acetaminophen (TYLENOL PM) 25-500 MG TABS tablet Take 1 tablet by mouth at bedtime as needed (for sleep).   Yes Historical Provider, MD  fosinopril (MONOPRIL) 10 MG tablet Take 10 mg by mouth daily.   Yes Historical Provider, MD  guaifenesin (ROBITUSSIN) 100 MG/5ML syrup Take 200 mg by mouth every 4 (four) hours as needed for cough.   Yes Historical Provider, MD  levothyroxine (LEVOXYL) 150 MCG tablet Take 150 mcg by mouth daily.    Yes Historical Provider, MD  loperamide (IMODIUM) 2 MG capsule Take 2-4 mg by mouth as needed for diarrhea or loose stools.   Yes Historical Provider, MD  magnesium hydroxide (MILK OF MAGNESIA) 400 MG/5ML suspension Take 30 mLs by mouth every 4 (four) hours as needed for mild constipation.   Yes Historical Provider, MD  metFORMIN (GLUCOPHAGE) 500 MG tablet Take 500 mg by mouth daily with breakfast.    Yes Historical Provider, MD  Multiple Vitamin (MULTIVITAMIN WITH MINERALS) TABS tablet Take 1 tablet by mouth daily.   Yes Historical Provider, MD  omeprazole (PRILOSEC) 40 MG capsule Take 80 mg by mouth daily.   Yes Historical Provider, MD  ondansetron (ZOFRAN) 4 MG tablet Take 4 mg by mouth every 6 (six) hours as needed for nausea or vomiting.   Yes Historical Provider, MD  oxyCODONE (OXY IR/ROXICODONE) 5 MG immediate release tablet Take 5 mg by mouth every 4 (four) hours as needed for severe pain.   Yes Historical Provider, MD  polyethylene glycol (MIRALAX / GLYCOLAX) packet Take 17 g by mouth daily.   Yes Historical Provider, MD  tiotropium (SPIRIVA) 18 MCG inhalation capsule Place 18 mcg into inhaler and inhale daily.   Yes Historical Provider, MD  acetaminophen (TYLENOL) 325 MG tablet Take 2 tablets (650 mg total) by mouth every 6 (six) hours as needed. Patient not taking: Reported on 10/08/2015 09/15/15   Carrie Mew, MD  lisinopril  (PRINIVIL,ZESTRIL) 10 MG tablet Take 1 tablet (10 mg total) by mouth daily. Patient not taking: Reported on 10/08/2015 09/18/15   Henreitta Leber, MD      PHYSICAL EXAMINATION:   VITAL SIGNS: Blood pressure 101/52, pulse 69, temperature 100.5 F (38.1 C), temperature source Oral, resp. rate 27, height 5\' 9"  (1.753 m), weight 82.101 kg (181 lb), SpO2 96 %.  GENERAL:  81 y.o.-year-old patient lying in the bed with no acute distress.  EYES: Pupils equal, round, reactive to light and accommodation. No scleral icterus. Extraocular muscles intact.  HEENT: Head atraumatic, normocephalic. Oropharynx and nasopharynx clear.  NECK:  Supple, no jugular venous distention. No thyroid enlargement, no tenderness.  LUNGS: Normal breath sounds bilaterally, some diminished at basis,  no wheezing, rales,rhonchi or crepitation. No use of accessory muscles of respiration.  CARDIOVASCULAR: S1, S2 normal. No murmurs, rubs, or gallops.  ABDOMEN: Soft, nontender, nondistended. Bowel sounds present. No organomegaly or mass. No CVA tenderness but as she did on percussion, mild tenderness in the lumbar area palpation just below kyphoplasty scars  EXTREMITIES: No pedal edema,  cyanosis, or clubbing.  NEUROLOGIC: Cranial nerves II through XII are intact. Muscle strength 5/5 in all extremities. Sensation intact. Gait not checked.  PSYCHIATRIC: The patient is alert and oriented x 3.  SKIN: No obvious rash, lesion, or ulcer.   LABORATORY PANEL:   CBC  Recent Labs Lab 10/08/15 1605  WBC 12.4*  HGB 12.4  HCT 38.0  PLT 180  MCV 92.4  MCH 30.3  MCHC 32.8  RDW 13.8  LYMPHSABS 1.4  MONOABS 1.3*  EOSABS 0.0  BASOSABS 0.0   ------------------------------------------------------------------------------------------------------------------  Chemistries   Recent Labs Lab 10/08/15 1605  NA 135  K 4.3  CL 97*  CO2 28  GLUCOSE 151*  BUN 13  CREATININE 0.90  CALCIUM 8.7*  AST 18  ALT 14  ALKPHOS 100  BILITOT  0.5   ------------------------------------------------------------------------------------------------------------------  Cardiac Enzymes  Recent Labs Lab 10/08/15 1605  TROPONINI <0.03   ------------------------------------------------------------------------------------------------------------------  RADIOLOGY: Dg Chest 2 View  10/08/2015  CLINICAL DATA:  Shortness of breath. Clinical concern for a spontaneous pneumothorax. Right chest pain this morning. Hemoptysis and hypoxia. EXAM: CHEST  2 VIEW COMPARISON:  Previous examinations, the most recent dated 09/16/2015. FINDINGS: Poor inspiration. Normal sized heart. Stable mild linear density in the right lower lung zone and mildly increased linear density at the left lung base. Mildly prominent interstitial markings. Thoracolumbar vertebral compression deformity and associated kyphoplasty material. There is linear density extending posteriorly from this area, possibly representing kyphoplasty material in the injection tract. IMPRESSION: 1. Poor inspiration with mild left basilar atelectasis. 2. Stable linear scarring at the right lung base. 3. Stable mild chronic interstitial lung disease. Electronically Signed   By: Claudie Revering M.D.   On: 10/08/2015 16:33    EKG: Orders placed or performed during the hospital encounter of 10/08/15  . ED EKG  . ED EKG  . EKG 12-Lead  . EKG 12-Lead  . EKG 12-Lead  . EKG 12-Lead    IMPRESSION AND PLAN:  Principal Problem:   Hemoptysis Active Problems:   Healthcare-associated pneumonia   UTI (lower urinary tract infection)   Back pain 1. Hemoptysis due to unclear etiology at present possibly pneumonia related versus pharyngeal irritation  due to recent vomiting, a CT angiogram to rule out pulmonary embolism, follow clinically 2. Suspected bacterial pneumonia health care associated, continue vancomycin as well as aztreonam intravenously, get sputum cultures 3. UTI, urine cultures, continue  antibiotic 4. Back pain, continue outpatient medications   All the records are reviewed and case discussed with ED provider. Management plans discussed with the patient, family and they are in agreement.  CODE STATUS:  Full code  TOTAL TIME TAKING CARE OF THIS PATIENT: 60 minutes.    Theodoro Grist M.D on 10/08/2015 at 7:07 PM  Between 7am to 6pm - Pager - [phone] After 6pm go to [email or web address] - password [email or web address] [place] [facility]  Office  [phone]  CC: Primary care physician; Volanda Napoleon, MD

## 2015-10-08 NOTE — ED Notes (Addendum)
Pt arrived via EMS from [facility] for shortness of breath and possible spontaneous pneumothorax. EMS reports pt started having c/o R chest pain this morning and she was sent for a CXR. Pt has been having hemoptysis and low oxygen sats on RA in the low 90s. Pt in NAD on arrival.

## 2015-10-08 NOTE — Progress Notes (Signed)
ANTIBIOTIC CONSULT NOTE - INITIAL  Pharmacy Consult for Aztreonam and Vancomycin  Indication: HCAP and UTI  Allergies  Allergen Reactions  . Penicillins Shortness Of Breath, Rash and Other (See Comments)    Has patient had a PCN reaction causing immediate rash, facial/tongue/throat swelling, SOB or lightheadedness with hypotension: Yes Has patient had a PCN reaction causing severe rash involving mucus membranes or skin necrosis: No Has patient had a PCN reaction that required hospitalization No Has patient had a PCN reaction occurring within the last 10 years: No If all of the above answers are "NO", then may proceed with Cephalosporin use.  . Sulfa Antibiotics Shortness Of Breath and Rash  . Aspirin Other (See Comments)    Reaction:  Unknown     Patient Measurements: Height: 5\' 9"  (175.3 cm) Weight: 181 lb (82.101 kg) IBW/kg (Calculated) : 66.2  Vital Signs: Temp: 100.5 F (38.1 C) (11/04 1602) Temp Source: Oral (11/04 1602) BP: 101/52 mmHg (11/04 1830) Pulse Rate: 69 (11/04 1830)  Labs:  Recent Labs  10/08/15 1605  WBC 12.4*  HGB 12.4  PLT 180  CREATININE 0.90   Estimated Creatinine Clearance: 50.5 mL/min (by C-G formula based on Cr of 0.9). No results for input(s): VANCOTROUGH, VANCOPEAK, VANCORANDOM, GENTTROUGH, GENTPEAK, GENTRANDOM, TOBRATROUGH, TOBRAPEAK, TOBRARND, AMIKACINPEAK, AMIKACINTROU, AMIKACIN in the last 72 hours.   Microbiology: No results found for this or any previous visit (from the past 720 hour(s)).  Medical History: Past Medical History  Diagnosis Date  . History of kidney problems   . Diabetes ([place])   . Asthma   . Osteoarthritis   . Bilateral swelling of feet     and legs  . Neuropathy [facility])    Assessment: 81 yo female w/PMH of COPD, HTN, and DM present to ED with SOB, productive cough, and hemoptysis. Pharmacy consulted for dosing and monitoring of Vancomycin and Aztreonam for HCAP.  WBC 12.4, Temp 100.5, Urine and sputum culture  pending.   Patient was given one dose of aztreonam 2mg  and Vancomycin 1000mg  in ED.   Goal of Therapy:  Vancomycin trough level 15-20 mcg/ml  Plan:  Ke: 0.047  T1/2: 15  Vd: 57  Will start patient on regimen of Vancomycin 1000mg  every 18 hours with stack dosing in 6 hours after 1st dose. Trough ordered for 10/11/15 @0530  before 4th dose.  Will start Aztreonam 1g every 8 hours.   Pharmacy will continue to monitor labs and renal function and make adjustments as needed.   Nancy Fetter, PharmD Pharmacy Resident

## 2015-10-08 NOTE — ED Provider Notes (Addendum)
[facility] Emergency Department Provider Note  ____________________________________________  Time seen: Approximately 4:18 PM  I have reviewed the triage vital signs and the nursing notes.   HISTORY  Chief Complaint Shortness of Breath    HPI Jessica Erickson is a 81 y.o. female with COPD, HTN, DM, HLD, who presents for evaluation of shortness of breath, chest pain, cough with small volume hemoptysis, gradual onset since yesterday, constant since onset, currently moderate. She had a portable chest x-ray done at the [facility] which was concerning for spontaneous pneumothorax and was sent to the ER for further evaluation. Currently she is not complaining of any chest pain but does feel mildly short of breath. No documented fevers at the [place]. No abdominal pain, vomiting or diarrhea. No modifying factors.   Past Medical History  Diagnosis Date  . History of kidney problems   . Diabetes ([place])   . Asthma   . Osteoarthritis   . Bilateral swelling of feet     and legs  . Neuropathy [facility])     Patient Active Problem List   Diagnosis Date Noted  . Vomiting 09/16/2015    Past Surgical History  Procedure Laterality Date  . Brain tumor surgery    . Thyroid surgery    . Throat surgery    . Cholecystectomy      Current Outpatient Rx  Name  Route  Sig  Dispense  Refill  . acetaminophen (TYLENOL) 325 MG tablet   Oral   Take 2 tablets (650 mg total) by mouth every 6 (six) hours as needed.   60 tablet   0   . atorvastatin (LIPITOR) 40 MG tablet   Oral   Take 40 mg by mouth daily.         . cloNIDine (CATAPRES) 0.1 MG tablet   Oral   Take 0.1 mg by mouth daily.          . Diphenhydramine-APAP, sleep, (TYLENOL PM EXTRA STRENGTH PO)   Oral   Take by mouth at bedtime.         . fosinopril (MONOPRIL) 10 MG tablet   Oral   Take 10 mg by mouth daily.         Marland Kitchen levothyroxine (LEVOXYL) 150 MCG tablet   Oral   Take 150  mcg by mouth daily before breakfast.         . lisinopril (PRINIVIL,ZESTRIL) 10 MG tablet   Oral   Take 1 tablet (10 mg total) by mouth daily.         . metFORMIN (GLUCOPHAGE) 500 MG tablet   Oral   Take by mouth 2 (two) times daily with a meal.         . Multiple Vitamin (MULTIVITAMIN) tablet   Oral   Take 1 tablet by mouth daily.         Marland Kitchen oxyCODONE (ROXICODONE) 5 MG immediate release tablet   Oral   Take 0.5 tablets (2.5 mg total) by mouth every 6 (six) hours as needed for breakthrough pain.   8 tablet   0     Allergies Penicillins; Sulfa antibiotics; and Aspirin  Family History  Problem Relation Age of Onset  . Diabetes Mother     Social History Social History  Substance Use Topics  . Smoking status: Never Smoker   . Smokeless tobacco: Never Used  . Alcohol Use: No    Review of Systems Constitutional: No fever/chills Eyes: No visual changes. ENT: No  sore throat. Cardiovascular: + chest pain. Respiratory: + shortness of breath. Gastrointestinal: No abdominal pain.  No nausea, no vomiting.  No diarrhea.  No constipation. Genitourinary: Negative for dysuria. Musculoskeletal: Negative for back pain. Skin: Negative for rash. Neurological: Negative for headaches, focal weakness or numbness.  10-point ROS otherwise negative.  ____________________________________________   PHYSICAL EXAM:  VITAL SIGNS: ED Triage Vitals  Enc Vitals Group     BP 10/08/15 1602 116/65 mmHg     Pulse Rate 10/08/15 1602 88     Resp 10/08/15 1602 30     Temp 10/08/15 1602 100.5 F (38.1 C)     Temp Source 10/08/15 1602 Oral     SpO2 10/08/15 1602 90 %     Weight --      Height --      Head Cir --      Peak Flow --      Pain Score 10/08/15 1603 0     Pain Loc --      Pain Edu? --      Excl. in [address]? --     Constitutional: Alert and oriented. Fatigued but nontoxic-appearing. Eyes: Conjunctivae are normal. PERRL. EOMI. Head: Atraumatic. Nose: No  congestion/rhinnorhea. Mouth/Throat: Mucous membranes are moist.  Oropharynx non-erythematous. Neck: No stridor.  Cardiovascular: Normal rate, regular rhythm. Grossly normal heart sounds.  Good peripheral circulation. Respiratory: Tachypnea with mildly increased work of breathing, slightly diminished breath sounds bilateral bases. Gastrointestinal: Soft and nontender. No distention.  No CVA tenderness. Genitourinary: deferred Musculoskeletal: No lower extremity tenderness nor edema.  No joint effusions. Neurologic:  Normal speech and language. No gross focal neurologic deficits are appreciated.  Skin:  Skin is warm, dry and intact. No rash noted. Psychiatric: Mood and affect are normal. Speech and behavior are normal.  ____________________________________________   LABS (all labs ordered are listed, but only abnormal results are displayed)  Labs Reviewed  CBC WITH DIFFERENTIAL/PLATELET - Abnormal; Notable for the following:    WBC 12.4 (*)    Neutro Abs 9.6 (*)    Monocytes Absolute 1.3 (*)    All other components within normal limits  COMPREHENSIVE METABOLIC PANEL - Abnormal; Notable for the following:    Chloride 97 (*)    Glucose, Bld 151 (*)    Calcium 8.7 (*)    Albumin 2.9 (*)    GFR calc non Af Amer 56 (*)    All other components within normal limits  URINALYSIS COMPLETEWITH MICROSCOPIC ([facility] ONLY) - Abnormal; Notable for the following:    Color, Urine AMBER (*)    APPearance CLOUDY (*)    Protein, ur 30 (*)    Leukocytes, UA 3+ (*)    Squamous Epithelial / LPF TOO NUMEROUS TO COUNT (*)    All other components within normal limits  CULTURE, BLOOD (ROUTINE X 2)  CULTURE, BLOOD (ROUTINE X 2)  URINE CULTURE  TROPONIN I  LACTIC ACID, PLASMA  LACTIC ACID, PLASMA  INFLUENZA PANEL BY PCR (TYPE A & B, H1N1)   ____________________________________________  EKG  ED ECG REPORT I, Joanne Gavel, the attending physician, personally viewed and interpreted this ECG.    Date: 10/08/2015  EKG Time: 15:56  Rate: 87  Rhythm: normal sinus rhythm  Axis: left  Intervals:normal  ST&T Change: No acute ST elevation.  ____________________________________________  RADIOLOGY CXR  IMPRESSION: 1. Poor inspiration with mild left basilar atelectasis. 2. Stable linear scarring at the right lung base. 3. Stable mild chronic interstitial lung disease.  ____________________________________________  PROCEDURES  Procedure(s) performed: None  Critical Care performed: Yes, see critical care note(s). Total critical care time spent 30 minutes.  ____________________________________________   INITIAL IMPRESSION / ASSESSMENT AND PLAN / ED COURSE  Pertinent labs & imaging results that were available during my care of the patient were reviewed by me and considered in my medical decision making (see chart for details).  KAHLYN WASIELEWSKI is a 81 y.o. female with COPD, HTN, DM, HLD, who presents for evaluation of shortness of breath, chest pain, cough with small volume hemoptysis, gradual onset since yesterday with chest x-ray at [facility] which was concerning for pneumothorax. On exam, she is generally nontoxic-appearing but she does appear fatigued. Febrile with a rate in the 30s and borderline hypoxic with O2 sat 90% on room air which improved to 97% on 2 L elemental oxygen. Currently meeting 2 out of 4 Sirs criteria. We'll obtain stat 2 view chest x-ray to evaluate for pneumonia and rule out pneumothorax, screening labs, EKG, blood cultures, urinalysis, will give liberal fluids, vancomycin and aztreonam and anticipate admission. We'll also screen for flu.  ----------------------------------------- 5:38 PM on 10/08/2015 -----------------------------------------  Labs reviewed. CBC notable for leukocytosis. CMP generally unremarkable. Troponin negative. Urinalysis with multiple leukocytes, too numerous to count white blood cells, also many squamous cells but given  many white blood cells, history of recurrent urinary tract infections, her meeting of multiple Sirs criteria, clinical picture could represent urosepsis. Cultures sent. Chest x-ray shows no evidence of pneumothorax, no pneumonia. Continue above treatment. Case discussed with hospitalist for admission at this time.  ____________________________________________   FINAL CLINICAL IMPRESSION(S) / ED DIAGNOSES  Final diagnoses:  Sepsis, due to unspecified organism [facility])  UTI (lower urinary tract infection)      Joanne Gavel, MD 10/08/15 [place] Kemontae Dunklee, MD 10/30/15 [phone]

## 2015-10-09 ENCOUNTER — Inpatient Hospital Stay: Payer: Medicare PPO

## 2015-10-09 ENCOUNTER — Inpatient Hospital Stay
Admit: 2015-10-09 | Discharge: 2015-10-09 | Disposition: A | Discharge status: Home / Self Care | Payer: Medicare PPO | Attending: Internal Medicine | Admitting: Internal Medicine

## 2015-10-09 LAB — COMPREHENSIVE METABOLIC PANEL
ALT: 11 U/L — ABNORMAL LOW (ref 14–54)
AST: 13 U/L — ABNORMAL LOW (ref 15–41)
Albumin: 2.4 g/dL — ABNORMAL LOW (ref 3.5–5.0)
Alkaline Phosphatase: 83 U/L (ref 38–126)
Anion gap: 5 (ref 5–15)
BUN: 12 mg/dL (ref 6–20)
CO2: 26 mmol/L (ref 22–32)
Calcium: 8 mg/dL — ABNORMAL LOW (ref 8.9–10.3)
Chloride: 106 mmol/L (ref 101–111)
Creatinine, Ser: 0.71 mg/dL (ref 0.44–1.00)
GFR calc Af Amer: >60 mL/min (ref >60)
GFR calc non Af Amer: >60 mL/min (ref >60)
Glucose, Bld: 127 mg/dL — ABNORMAL HIGH (ref 65–99)
Potassium: 3.9 mmol/L (ref 3.5–5.1)
Sodium: 137 mmol/L (ref 135–145)
Total Bilirubin: 0.4 mg/dL (ref 0.3–1.2)
Total Protein: 5.7 g/dL — ABNORMAL LOW (ref 6.5–8.1)

## 2015-10-09 LAB — PROTIME-INR
INR: 1.19
Prothrombin Time: 15.3 seconds — ABNORMAL HIGH (ref 11.4–15.0)

## 2015-10-09 LAB — GLUCOSE, CAPILLARY
Glucose-Capillary: 119 mg/dL — ABNORMAL HIGH (ref 65–99)
Glucose-Capillary: 116 mg/dL — ABNORMAL HIGH (ref 65–99)

## 2015-10-09 MED ORDER — GLUCERNA SHAKE PO LIQD
237.0000 mL | Freq: Three times a day (TID) | ORAL | Status: DC
  Administered 2015-10-09 – 2015-10-10 (×4): 237 mL via ORAL

## 2015-10-09 MED ORDER — INSULIN ASPART 100 UNIT/ML ~~LOC~~ SOLN: 0.0000 [IU] | Freq: Every day | SUBCUTANEOUS | Status: DC

## 2015-10-09 MED ORDER — CIPROFLOXACIN IN D5W 400 MG/200ML IV SOLN
400.0000 mg | Freq: Two times a day (BID) | INTRAVENOUS | Status: DC
  Administered 2015-10-09 – 2015-10-11 (×4): 400 mg via INTRAVENOUS
  Filled 2015-10-09 (×5): qty 200

## 2015-10-09 MED ORDER — ENOXAPARIN SODIUM 80 MG/0.8ML ~~LOC~~ SOLN
1.0000 mg/kg | Freq: Two times a day (BID) | SUBCUTANEOUS | Status: DC
  Administered 2015-10-09 – 2015-10-13 (×9): 80 mg via SUBCUTANEOUS
  Filled 2015-10-09 (×11): qty 0.8

## 2015-10-09 MED ORDER — WARFARIN - PHARMACIST DOSING INPATIENT
Freq: Every day | Status: DC
  Administered 2015-10-09 – 2015-10-12 (×4)

## 2015-10-09 MED ORDER — WARFARIN SODIUM 5 MG PO TABS
2.5000 mg | ORAL_TABLET | Freq: Once | ORAL | Status: AC
  Administered 2015-10-09: 2.5 mg via ORAL
  Filled 2015-10-09: qty 1

## 2015-10-09 MED ORDER — IPRATROPIUM-ALBUTEROL 0.5-2.5 (3) MG/3ML IN SOLN: 3.0000 mL | Freq: Four times a day (QID) | RESPIRATORY_TRACT | Status: DC | PRN

## 2015-10-09 MED ORDER — INSULIN ASPART 100 UNIT/ML ~~LOC~~ SOLN
0.0000 [IU] | Freq: Three times a day (TID) | SUBCUTANEOUS | Status: DC
  Administered 2015-10-10 – 2015-10-13 (×5): 1 [IU] via SUBCUTANEOUS
  Filled 2015-10-09 (×6): qty 1

## 2015-10-09 NOTE — Progress Notes (Addendum)
[facility] [place] NAME: Jessica Erickson    MR#:  [id number]  DATE OF BIRTH:  04-25-1926  SUBJECTIVE:  CHIEF COMPLAINT:   Chief Complaint  Patient presents with  . Shortness of Breath   hemoptysis this morning,  no shortness of breath.  REVIEW OF SYSTEMS:  CONSTITUTIONAL: No fever, has generalized weakness.  EYES: No blurred or double vision.  EARS, NOSE, AND THROAT: No tinnitus or ear pain.  RESPIRATORY: No cough, shortness of breath, wheezing but has hemoptysis.  CARDIOVASCULAR: No chest pain, orthopnea, edema.  GASTROINTESTINAL: No nausea, vomiting, diarrhea or abdominal pain.  GENITOURINARY: No dysuria, hematuria.  ENDOCRINE: No polyuria, nocturia,  HEMATOLOGY: No anemia, easy bruising or bleeding SKIN: No rash or lesion. MUSCULOSKELETAL: No joint pain or arthritis.   NEUROLOGIC: No tingling, numbness, weakness.  PSYCHIATRY: No anxiety or depression.   DRUG ALLERGIES:   Allergies  Allergen Reactions  . Penicillins Shortness Of Breath, Rash and Other (See Comments)    Has patient had a PCN reaction causing immediate rash, facial/tongue/throat swelling, SOB or lightheadedness with hypotension: Yes Has patient had a PCN reaction causing severe rash involving mucus membranes or skin necrosis: No Has patient had a PCN reaction that required hospitalization No Has patient had a PCN reaction occurring within the last 10 years: No If all of the above answers are "NO", then may proceed with Cephalosporin use.  . Sulfa Antibiotics Shortness Of Breath and Rash  . Aspirin Other (See Comments)    Reaction:  Unknown     VITALS:  Blood pressure 102/46, pulse 82, temperature 98.7 F (37.1 C), temperature source Oral, resp. rate 20, height 5\' 9"  (1.753 m), weight 82.101 kg (181 lb), SpO2 91 %.  PHYSICAL EXAMINATION:  GENERAL:  81 y.o.-year-old patient lying in the bed with no acute distress.  EYES: Pupils equal, round, reactive to  light and accommodation. No scleral icterus. Extraocular muscles intact.  HEENT: Head atraumatic, normocephalic. Oropharynx and nasopharynx clear. Dry oral mucosa. NECK:  Supple, no jugular venous distention. No thyroid enlargement, no tenderness.  LUNGS: Normal breath sounds bilaterally, no wheezing, rales,rhonchi or crepitation. No use of accessory muscles of respiration.  CARDIOVASCULAR: S1, S2 normal. Systolic murmurs 2/6, rubs, or gallops.  ABDOMEN: Soft, nontender, nondistended. Bowel sounds present. No organomegaly or mass.  EXTREMITIES: No pedal edema, cyanosis, or clubbing. But has right-sided calf sore. NEUROLOGIC: Cranial nerves II through XII are intact. Muscle strength 5/5 in all extremities. Sensation intact. Gait not checked.  PSYCHIATRIC: The patient is alert and oriented x 3.  SKIN: No obvious rash, lesion, or ulcer.    LABORATORY PANEL:   CBC  Recent Labs Lab 10/08/15 1605  WBC 12.4*  HGB 12.4  HCT 38.0  PLT 180   ------------------------------------------------------------------------------------------------------------------  Chemistries   Recent Labs Lab 10/09/15 0454  NA 137  K 3.9  CL 106  CO2 26  GLUCOSE 127*  BUN 12  CREATININE 0.71  CALCIUM 8.0*  AST 13*  ALT 11*  ALKPHOS 83  BILITOT 0.4   ------------------------------------------------------------------------------------------------------------------  Cardiac Enzymes  Recent Labs Lab 10/08/15 1605  TROPONINI <0.03   ------------------------------------------------------------------------------------------------------------------  RADIOLOGY:  Dg Chest 2 View  10/08/2015  CLINICAL DATA:  Shortness of breath. Clinical concern for a spontaneous pneumothorax. Right chest pain this morning. Hemoptysis and hypoxia. EXAM: CHEST  2 VIEW COMPARISON:  Previous examinations, the most recent dated 09/16/2015. FINDINGS: Poor inspiration. Normal sized heart. Stable mild linear density in the right  lower lung zone and mildly increased linear density at the left lung base. Mildly prominent interstitial markings. Thoracolumbar vertebral compression deformity and associated kyphoplasty material. There is linear density extending posteriorly from this area, possibly representing kyphoplasty material in the injection tract. IMPRESSION: 1. Poor inspiration with mild left basilar atelectasis. 2. Stable linear scarring at the right lung base. 3. Stable mild chronic interstitial lung disease. Electronically Signed   By: Claudie Revering M.D.   On: 10/08/2015 16:33   Ct Angio Chest Pe W/cm &/or Wo Cm  10/08/2015  CLINICAL DATA:  Shortness of breath. Patient with history of COPD, hypertension and diabetes. Patient treated with kyphoplasty on 09/21/2015 following a fall. Patient has been undergoing medial dilatation. Patient had episode of nausea vomiting yesterday. Hemoptysis reported today. EXAM: CT ANGIOGRAPHY CHEST WITH CONTRAST TECHNIQUE: Multidetector CT imaging of the chest was performed using the standard protocol during bolus administration of intravenous contrast. Multiplanar CT image reconstructions and MIPs were obtained to evaluate the vascular anatomy. CONTRAST:  139mL OMNIPAQUE IOHEXOL 350 MG/ML SOLN COMPARISON:  Current chest radiograph. FINDINGS: Angiographic study: There multiple bilateral pulmonary emboli. On the right, emboli are noted along the distal main pulmonary artery extending into segmental branches to all 3 lobes. On the left, pulmonary emboli extend from the distal main pulmonary artery, distal to the left upper lobe branch takes off, into multiple segmental branches of the lower and upper lobe, particularly the lingula and posterior basilar segment. The thoracic aorta is normal in caliber. There is mild partly calcified plaque. No dissection. There is prominence of the main pulmonary arteries, left measuring 2.7 cm and right measuring 2.7 cm. RV/LV ratio:  1.48 Thoracic inlet:  No mass or  adenopathy. Mediastinum and hila: Heart normal in size. Mild coronary artery calcifications. There are scattered sub cm mediastinal lymph nodes. Mildly enlarged subcarinal node measuring 15 mm in short axis. No other enlarged mediastinal nodes. No mediastinal or hilar masses. No hilar adenopathy. Lungs and pleura: There is coarse reticular and patchy airspace opacity as well some hazy airspace opacity in the dependent lower lobes with milder reticular and hazy opacity noted in the right upper lobe. There is no pulmonary edema. Minimal right pleural effusion. No pneumothorax. Limited upper abdomen:  Unremarkable. Musculoskeletal: Mild thoracic spine degenerative change. Bones demineralized. No osteoblastic or osteolytic lesions. Review of the MIP images confirms the above findings. IMPRESSION: 1. Multiple bilateral pulmonary emboli with associated lung atelectasis. Minimal right effusion. 2. Positive for acute PE with CT evidence of right heart strain (RV/LV Ratio = 1.48) consistent with at least submassive (intermediate risk) PE. The presence of right heart strain has been associated with an increased risk of morbidity and mortality. Please activate Code PE by paging [phone]. Electronically Signed   By: Lajean Manes M.D.   On: 10/08/2015 20:55   US Venous Img Lower Bilateral  10/09/2015  CLINICAL DATA:  Pulmonary embolism. EXAM: BILATERAL LOWER EXTREMITY VENOUS DOPPLER ULTRASOUND TECHNIQUE: Gray-scale sonography with graded compression, as well as color Doppler and duplex ultrasound were performed to evaluate the lower extremity deep venous systems from the level of the common femoral vein and including the common femoral, femoral, profunda femoral, popliteal and calf veins including the posterior tibial, peroneal and gastrocnemius veins when visible. The superficial great saphenous vein was also interrogated. Spectral Doppler was utilized to evaluate flow at rest and with distal augmentation maneuvers in  the common femoral, femoral and popliteal veins. COMPARISON:  Previous left leg venous Doppler ultrasounds. FINDINGS: RIGHT  LOWER EXTREMITY Common Femoral Vein: No evidence of thrombus. Normal compressibility, respiratory phasicity and response to augmentation. Saphenofemoral Junction: No evidence of thrombus. Normal compressibility and flow on color Doppler imaging. Profunda Femoral Vein: No evidence of thrombus. Normal compressibility and flow on color Doppler imaging. Femoral Vein: Occlusive and nonocclusive thrombus. Popliteal Vein: Occlusive thrombus. Calf Veins: No evidence of thrombus. Normal compressibility and flow on color Doppler imaging. Superficial Great Saphenous Vein: No evidence of thrombus. Normal compressibility and flow on color Doppler imaging. Venous Reflux:  None. Other Findings:  None. LEFT LOWER EXTREMITY Common Femoral Vein: No evidence of thrombus. Normal compressibility, respiratory phasicity and response to augmentation. Saphenofemoral Junction: No evidence of thrombus. Normal compressibility and flow on color Doppler imaging. Profunda Femoral Vein: No evidence of thrombus. Normal compressibility and flow on color Doppler imaging. Femoral Vein: No evidence of thrombus. Normal compressibility, respiratory phasicity and response to augmentation. Popliteal Vein: No evidence of thrombus. Normal compressibility, respiratory phasicity and response to augmentation. Calf Veins: No evidence of thrombus. Normal compressibility and flow on color Doppler imaging. Superficial Great Saphenous Vein: No evidence of thrombus. Normal compressibility and flow on color Doppler imaging. Venous Reflux:  None. Other Findings:  None. IMPRESSION: Deep venous thrombosis in the right femoral and popliteal veins. Electronically Signed   By: Claudie Revering M.D.   On: 10/09/2015 12:17    EKG:   Orders placed or performed during the hospital encounter of 10/08/15  . ED EKG  . ED EKG  . EKG 12-Lead  . EKG 12-Lead   . EKG 12-Lead  . EKG 12-Lead    ASSESSMENT AND PLAN:   1. Bilateral PE and Right femoral and popliteal DVT. Started on Lovenox 1 mg/kg every 12 hours. Musculoskeletal surgery consult for possible IVC filter placement. Follow-up echocardiogram. I discussed with the patient, the patient's son and daughter about benefit and side effects of anticoagulation by mouth medication. They agree to start oral anticoagulation medication. Follow-up hemoglobin. Leukocytosis. Hospital due to reaction. Discontinue vancomycin and Azactam.  2. UTI, continue Cipro, follow-up urine cultures.  3. Back pain, continue outpatient medications 4. History of COPD per patient and her daughter. Stable. Continue Spiriva. 5. DM2, start sliding scale.   PT evaluation.  All the records are reviewed and case discussed with Care Management/Social Workerr. Management plans discussed with the patient, her daughter and son and they are in agreement.  CODE STATUS: Full code  TOTAL TIME TAKING CARE OF THIS PATIENT: 53 minutes.   POSSIBLE D/C 2 SKILLED NURSING FACILITY IN 2-3 DAYS, DEPENDING ON CLINICAL CONDITION.   Demetrios Loll M.D on 10/09/2015 at 4:04 PM  Between 7am to 6pm - Pager - [phone]  After 6pm go to [email or web address] - password [email or web address]  Office  [phone]  CC: Primary care physician; Volanda Napoleon, MD

## 2015-10-09 NOTE — Progress Notes (Signed)
Initial Nutrition Assessment  DOCUMENTATION CODES:      INTERVENTION:  Meals and snacks: Cater to pt preferences, requesting chopped meats  Medical Nutrition Supplement therapy: Recommend adding glucerna TID for added nutrition   NUTRITION DIAGNOSIS:   Inadequate oral intake related to acute illness as evidenced by per patient/family report.    GOAL:   Patient will meet greater than or equal to 90% of their needs    MONITOR:    (Energy intake, Digestive system)  REASON FOR ASSESSMENT:   Malnutrition Screening Tool    ASSESSMENT:      Pt admitted with weakness, facial pain, nausea, vomiting  Past Medical History  Diagnosis Date  . History of kidney problems   . Diabetes ([place])   . Asthma   . Osteoarthritis   . Bilateral swelling of feet     and legs  . Neuropathy ([facility])     Current Nutrition: eating few bites of lunch  Food/Nutrition-Related History: reports appetite has been down for the past 2 weeks   Scheduled Medications:  . atorvastatin  40 mg Oral Daily  . carbamazepine  200 mg Oral BID  . cloNIDine  0.1 mg Oral QHS  . enoxaparin (LOVENOX) injection  1 mg/kg Subcutaneous Q12H  . levothyroxine  150 mcg Oral QAC breakfast  . lisinopril  10 mg Oral Daily  . pantoprazole (PROTONIX) IV  40 mg Intravenous Q12H  . polyethylene glycol  17 g Oral Daily  . sodium chloride  3 mL Intravenous Q12H  . tiotropium  18 mcg Inhalation Daily         Electrolyte/Renal Profile and Glucose Profile:   Recent Labs Lab 10/08/15 1605 10/09/15 0454  NA 135 137  K 4.3 3.9  CL 97* 106  CO2 28 26  BUN 13 12  CREATININE 0.90 0.71  CALCIUM 8.7* 8.0*  GLUCOSE 151* 127*   Protein Profile:  Recent Labs Lab 10/08/15 1605 10/09/15 0454  ALBUMIN 2.9* 2.4*    Gastrointestinal Profile: Last [id number] to admission   Nutrition-Focused Physical Exam Findings: Nutrition-Focused physical exam completed. Findings are WDL for fat depletion, muscle depletion, and  edema.     Weight Change: 16% weight loss in the last year and 8 months    Diet Order:  DIET SOFT Room service appropriate?: Yes; Fluid consistency:: Thin  Skin:   reviewed     Height:   Ht Readings from Last 1 Encounters:  10/08/15 5\' 9"  (1.753 m)    Weight:   Wt Readings from Last 1 Encounters:  10/08/15 181 lb (82.101 kg)     BMI:  Body mass index is 26.72 kg/(m^2).  Estimated Nutritional Needs:   Kcal:  BEE 1605 kcals (IF 1.0-1.3, AF 1.3) 2086 -2712 kcals/d  Protein:  (1.0-1.2 g/kg) 82-98 g/d  Fluid:  (25-87ml/kg) 2050-2470ml/d  EDUCATION NEEDS:   No education needs identified at this time  [place]. Zenia Resides, [place], [place] (pager)

## 2015-10-09 NOTE — Progress Notes (Signed)
*  PRELIMINARY RESULTS* Echocardiogram 2D Echocardiogram has been performed.  Jessica Erickson 10/09/2015, 12:37 PM

## 2015-10-09 NOTE — Consult Note (Signed)
ANTICOAGULATION CONSULT NOTE - Initial Consult  Pharmacy Consult for warfarin Indication: DVT/PE  Allergies  Allergen Reactions  . Penicillins Shortness Of Breath, Rash and Other (See Comments)    Has patient had a PCN reaction causing immediate rash, facial/tongue/throat swelling, SOB or lightheadedness with hypotension: Yes Has patient had a PCN reaction causing severe rash involving mucus membranes or skin necrosis: No Has patient had a PCN reaction that required hospitalization No Has patient had a PCN reaction occurring within the last 10 years: No If all of the above answers are "NO", then may proceed with Cephalosporin use.  . Sulfa Antibiotics Shortness Of Breath and Rash  . Aspirin Other (See Comments)    Reaction:  Unknown     Patient Measurements: Height: 5\' 9"  (175.3 cm) Weight: 181 lb (82.101 kg) IBW/kg (Calculated) : 66.2 Heparin Dosing Weight:   Vital Signs:    Labs:  Recent Labs  10/08/15 1605 10/09/15 0454  HGB 12.4  --   HCT 38.0  --   PLT 180  --   CREATININE 0.90 0.71  TROPONINI <0.03  --     Estimated Creatinine Clearance: 56.8 mL/min (by C-G formula based on Cr of 0.71).   Medical History: Past Medical History  Diagnosis Date  . History of kidney problems   . Diabetes ([place])   . Asthma   . Osteoarthritis   . Bilateral swelling of feet     and legs  . Neuropathy ([facility])     Medications:  Scheduled:  . atorvastatin  40 mg Oral Daily  . carbamazepine  200 mg Oral BID  . ciprofloxacin  400 mg Intravenous Q12H  . cloNIDine  0.1 mg Oral QHS  . enoxaparin (LOVENOX) injection  1 mg/kg Subcutaneous Q12H  . feeding supplement (GLUCERNA SHAKE)  237 mL Oral TID WC  . insulin aspart  0-5 Units Subcutaneous QHS  . insulin aspart  0-9 Units Subcutaneous TID WC  . levothyroxine  150 mcg Oral QAC breakfast  . lisinopril  10 mg Oral Daily  . pantoprazole (PROTONIX) IV  40 mg Intravenous Q12H  . polyethylene glycol  17 g Oral Daily  . sodium  chloride  3 mL Intravenous Q12H  . tiotropium  18 mcg Inhalation Daily    Assessment: Pt is an 81 year old female with a DVT and PE. Due to interaction with Carbamazepine cannot use NOACs. Pharmacy consulted to dose warfarin. Pt currently on lovenox 1mg /kg q 12 hours.   Goal of Therapy:  INR 2-3 Monitor platelets by anticoagulation protocol: Yes   Plan:  Due to pt age, will start with 2.5mg  daily. Due to Carbamazepine drug interaction, may require higher warfarin dose, but will continue to monitor. Continue lovenox 1mg /kg for atleast 5 days and until INR is therapeutic x 2. Recheck INR in the AM. Adjust as needed.  Ramond Dial, Pharm.D Clinical Pharmacist   10/09/2015,5:22 PM

## 2015-10-10 LAB — GLUCOSE, CAPILLARY
Glucose-Capillary: 141 mg/dL — ABNORMAL HIGH (ref 65–99)
Glucose-Capillary: 116 mg/dL — ABNORMAL HIGH (ref 65–99)
Glucose-Capillary: 127 mg/dL — ABNORMAL HIGH (ref 65–99)
Glucose-Capillary: 142 mg/dL — ABNORMAL HIGH (ref 65–99)

## 2015-10-10 LAB — CBC
HCT: 33.2 % — ABNORMAL LOW (ref 35.0–47.0)
Hemoglobin: 10.9 g/dL — ABNORMAL LOW (ref 12.0–16.0)
MCH: 30.3 pg (ref 26.0–34.0)
MCHC: 32.8 g/dL (ref 32.0–36.0)
MCV: 92.5 fL (ref 80.0–100.0)
Platelets: 166 10*3/uL (ref 150–440)
RBC: 3.59 MIL/uL — ABNORMAL LOW (ref 3.80–5.20)
RDW: 13.9 % (ref 11.5–14.5)
WBC: 8.2 10*3/uL (ref 3.6–11.0)

## 2015-10-10 LAB — PROTIME-INR
INR: 1.23
Prothrombin Time: 15.7 seconds — ABNORMAL HIGH (ref 11.4–15.0)

## 2015-10-10 MED ORDER — WARFARIN SODIUM 5 MG PO TABS
2.5000 mg | ORAL_TABLET | Freq: Every day | ORAL | Status: DC
  Administered 2015-10-10: 2.5 mg via ORAL
  Filled 2015-10-10: qty 1
  Filled 2015-10-10: qty 0.5

## 2015-10-10 NOTE — Consult Note (Signed)
[facility] Vascular Consult Note  MRN : [id number]  Jessica Erickson is a 81 y.o. (February 24, 1926) female who presents with chief complaint of  Chief Complaint  Patient presents with  . Shortness of Breath  .  History of Present Illness: Patient is an 81 yo WF admitted with nausea and hypoxia.  Room air sats were in the 80s, and work up showed submassive bilateral PEs.  Has some right leg swelling and not much pain, but was found to have residual RLE DVT in the femoral and popltieal veins.  No previous clotting issues to her knowledge.  Had a fall and back procedure 3 weeks ago, and has been quite immobile since then.  Breathing reasonably well on [place] Oxygen now and hypoxia is improved.    Current Facility-Administered Medications  Medication Dose Route Frequency Provider Last Rate Last Dose  . acetaminophen (TYLENOL) tablet 650 mg  650 mg Oral Q6H PRN Theodoro Grist, MD       Or  . acetaminophen (TYLENOL) suppository 650 mg  650 mg Rectal Q6H PRN Theodoro Grist, MD      . acetaminophen (TYLENOL) tablet 650 mg  650 mg Oral Q6H PRN Theodoro Grist, MD   650 mg at 10/09/15 1819  . atorvastatin (LIPITOR) tablet 40 mg  40 mg Oral Daily Theodoro Grist, MD   40 mg at 10/09/15 1006  . carbamazepine (TEGRETOL) tablet 200 mg  200 mg Oral BID Theodoro Grist, MD   200 mg at 10/09/15 2142  . ciprofloxacin (CIPRO) IVPB 400 mg  400 mg Intravenous Q12H Demetrios Loll, MD   400 mg at 10/10/15 0515  . cloNIDine (CATAPRES) tablet 0.1 mg  0.1 mg Oral QHS Theodoro Grist, MD   0.1 mg at 10/09/15 2143  . enoxaparin (LOVENOX) injection 80 mg  1 mg/kg Subcutaneous Q12H Demetrios Loll, MD   80 mg at 10/10/15 0858  . feeding supplement (GLUCERNA SHAKE) (GLUCERNA SHAKE) liquid 237 mL  237 mL Oral TID WC Demetrios Loll, MD   237 mL at 10/10/15 0859  . guaifenesin (ROBITUSSIN) 100 MG/5ML syrup 200 mg  200 mg Oral Q4H PRN Theodoro Grist, MD      . insulin aspart (novoLOG) injection 0-5 Units  0-5 Units Subcutaneous QHS Demetrios Loll, MD   0 Units at 10/09/15 2200  . insulin aspart (novoLOG) injection 0-9 Units  0-9 Units Subcutaneous TID WC Demetrios Loll, MD   1 Units at 10/10/15 0859  . ipratropium-albuterol (DUONEB) 0.5-2.5 (3) MG/3ML nebulizer solution 3 mL  3 mL Nebulization Q6H PRN Demetrios Loll, MD      . levothyroxine (SYNTHROID, LEVOTHROID) tablet 150 mcg  150 mcg Oral QAC breakfast Theodoro Grist, MD   150 mcg at 10/10/15 0858  . lisinopril (PRINIVIL,ZESTRIL) tablet 10 mg  10 mg Oral Daily Theodoro Grist, MD   10 mg at 10/09/15 1006  . loperamide (IMODIUM) capsule 2-4 mg  2-4 mg Oral PRN Theodoro Grist, MD      . ondansetron (ZOFRAN) tablet 4 mg  4 mg Oral Q6H PRN Theodoro Grist, MD       Or  . ondansetron (ZOFRAN) injection 4 mg  4 mg Intravenous Q6H PRN Theodoro Grist, MD      . oxyCODONE (Oxy IR/ROXICODONE) immediate release tablet 5 mg  5 mg Oral Q4H PRN Theodoro Grist, MD      . pantoprazole (PROTONIX) injection 40 mg  40 mg Intravenous Q12H Theodoro Grist, MD   40 mg at 10/09/15 1006  .  polyethylene glycol (MIRALAX / GLYCOLAX) packet 17 g  17 g Oral Daily Theodoro Grist, MD   17 g at 10/09/15 1005  . sodium chloride 0.9 % injection 3 mL  3 mL Intravenous Q12H Theodoro Grist, MD   3 mL at 10/09/15 2147  . tiotropium (SPIRIVA) inhalation capsule 18 mcg  18 mcg Inhalation Daily Theodoro Grist, MD   18 mcg at 10/09/15 0847  . warfarin (COUMADIN) tablet 2.5 mg  2.5 mg Oral q1800 Demetrios Loll, MD      . Warfarin - Pharmacist Dosing Inpatient   Does not apply q1800 Ramond Dial, RPH      . zolpidem (AMBIEN) tablet 5 mg  5 mg Oral QHS PRN Theodoro Grist, MD   5 mg at 10/09/15 2143    Past Medical History  Diagnosis Date  . History of kidney problems   . Diabetes ([place])   . Asthma   . Osteoarthritis   . Bilateral swelling of feet     and legs  . Neuropathy [facility])     Past Surgical History  Procedure Laterality Date  . Brain tumor surgery    . Thyroid surgery    . Throat surgery    . Cholecystectomy      Social  History Social History  Substance Use Topics  . Smoking status: Never Smoker   . Smokeless tobacco: Never Used  . Alcohol Use: No  Lives alone  Family History Family History  Problem Relation Age of Onset  . Diabetes Mother   no bleeding disorders, clotting disorders, or autoimmune diseases  Allergies  Allergen Reactions  . Penicillins Shortness Of Breath, Rash and Other (See Comments)    Has patient had a PCN reaction causing immediate rash, facial/tongue/throat swelling, SOB or lightheadedness with hypotension: Yes Has patient had a PCN reaction causing severe rash involving mucus membranes or skin necrosis: No Has patient had a PCN reaction that required hospitalization No Has patient had a PCN reaction occurring within the last 10 years: No If all of the above answers are "NO", then may proceed with Cephalosporin use.  . Sulfa Antibiotics Shortness Of Breath and Rash  . Aspirin Other (See Comments)    Reaction:  Unknown      REVIEW OF SYSTEMS (Negative unless checked)  Constitutional: [] Weight loss  [] Fever  [] Chills Cardiac: [] Chest pain   [] Chest pressure   [] Palpitations   [] Shortness of breath when laying flat   [] Shortness of breath at rest   [x] Shortness of breath with exertion. Vascular:  [] Pain in legs with walking   [] Pain in legs at rest   [] Pain in legs when laying flat   [] Claudication   [] Pain in feet when walking  [] Pain in feet at rest  [] Pain in feet when laying flat   [x] History of DVT   [] Phlebitis   [x] Swelling in legs   [] Varicose veins   [] Non-healing ulcers Pulmonary:   [] Uses home oxygen   [] Productive cough   [] Hemoptysis   [] Wheeze  [] COPD   [] Asthma Neurologic:  [] Dizziness  [] Blackouts   [] Seizures   [] History of stroke   [] History of TIA  [] Aphasia   [] Temporary blindness   [] Dysphagia   [] Weakness or numbness in arms   [] Weakness or numbness in legs Musculoskeletal:  [] Arthritis   [] Joint swelling   [] Joint pain   [] Low back pain Hematologic:   [] Easy bruising  [] Easy bleeding   [] Hypercoagulable state   [] Anemic  [] Hepatitis Gastrointestinal:  [] Blood in stool   []   Vomiting blood  [] Gastroesophageal reflux/heartburn   [] Difficulty swallowing. Genitourinary:  [] Chronic kidney disease   [] Difficult urination  [] Frequent urination  [] Burning with urination   [] Blood in urine Skin:  [] Rashes   [] Ulcers   [] Wounds Psychological:  [] History of anxiety   []  History of major depression.  Physical Examination  Filed Vitals:   10/09/15 1807 10/09/15 2045 10/10/15 0613 10/10/15 0831  BP: 129/55 110/47 129/50   Pulse: 89 72 72   Temp:  99.5 F (37.5 C) 99 F (37.2 C) 98.6 F (37 C)  TempSrc:  Oral Oral Oral  Resp:   24   Height:      Weight:      SpO2: 94% 94% 93%    Body mass index is 26.72 kg/(m^2). Gen:  WD/WN, NAD Head: [place]/AT, No temporalis wasting. Prominent temp pulse not noted. Ear/Nose/Throat: Hard of hearing, nares w/o erythema or drainage, oropharynx w/o Erythema/Exudate Eyes: PERRLA, EOMI.  Neck: Supple, no nuchal rigidity.  No bruit or JVD.  Pulmonary:  Good air movement, clear to auscultation bilaterally.  Cardiac: RRR, normal S1, S2, no Murmurs, rubs or gallops. Vascular:  Vessel Right Left  Radial Palpable Palpable  Ulnar Palpable Palpable  Brachial Palpable Palpable  Carotid Palpable, without bruit Palpable, without bruit  Aorta Not palpable N/A  Femoral Palpable Palpable  Popliteal Palpable Palpable  PT Palpable Palpable  DP Palpable Palpable   Gastrointestinal: soft, non-tender/non-distended. No guarding/reflex. No masses, surgical incisions, or scars. Musculoskeletal: M/S 5/5 throughout.  Extremities without ischemic changes.  No deformity or atrophy. Mild RLE edema. Neurologic: CN 2-12 intact. Pain and light touch intact in extremities.  Symmetrical.  Speech is fluent. Motor exam as listed above. Psychiatric: Judgment intact, Mood & affect appropriate for pt's clinical situation. Dermatologic: No rashes  or ulcers noted.  No cellulitis or open wounds. Lymph : No Cervical, Axillary, or Inguinal lymphadenopathy.      CBC Lab Results  Component Value Date   WBC 8.2 10/10/2015   HGB 10.9* 10/10/2015   HCT 33.2* 10/10/2015   MCV 92.5 10/10/2015   PLT 166 10/10/2015    BMET    Component Value Date/Time   NA 137 10/09/2015 0454   K 3.9 10/09/2015 0454   CL 106 10/09/2015 0454   CO2 26 10/09/2015 0454   GLUCOSE 127* 10/09/2015 0454   BUN 12 10/09/2015 0454   CREATININE 0.71 10/09/2015 0454   CALCIUM 8.0* 10/09/2015 0454   GFRNONAA >60 10/09/2015 0454   GFRAA >60 10/09/2015 0454   Estimated Creatinine Clearance: 56.8 mL/min (by C-G formula based on Cr of 0.71).  COAG Lab Results  Component Value Date   INR 1.23 10/10/2015   INR 1.19 10/09/2015    Radiology Dg Chest 2 View  10/08/2015  CLINICAL DATA:  Shortness of breath. Clinical concern for a spontaneous pneumothorax. Right chest pain this morning. Hemoptysis and hypoxia. EXAM: CHEST  2 VIEW COMPARISON:  Previous examinations, the most recent dated 09/16/2015. FINDINGS: Poor inspiration. Normal sized heart. Stable mild linear density in the right lower lung zone and mildly increased linear density at the left lung base. Mildly prominent interstitial markings. Thoracolumbar vertebral compression deformity and associated kyphoplasty material. There is linear density extending posteriorly from this area, possibly representing kyphoplasty material in the injection tract. IMPRESSION: 1. Poor inspiration with mild left basilar atelectasis. 2. Stable linear scarring at the right lung base. 3. Stable mild chronic interstitial lung disease. Electronically Signed   By: Claudie Revering M.D.   On:  10/08/2015 16:33   Ct Angio Chest Pe W/cm &/or Wo Cm  10/08/2015  CLINICAL DATA:  Shortness of breath. Patient with history of COPD, hypertension and diabetes. Patient treated with kyphoplasty on 09/21/2015 following a fall. Patient has been  undergoing medial dilatation. Patient had episode of nausea vomiting yesterday. Hemoptysis reported today. EXAM: CT ANGIOGRAPHY CHEST WITH CONTRAST TECHNIQUE: Multidetector CT imaging of the chest was performed using the standard protocol during bolus administration of intravenous contrast. Multiplanar CT image reconstructions and MIPs were obtained to evaluate the vascular anatomy. CONTRAST:  171mL OMNIPAQUE IOHEXOL 350 MG/ML SOLN COMPARISON:  Current chest radiograph. FINDINGS: Angiographic study: There multiple bilateral pulmonary emboli. On the right, emboli are noted along the distal main pulmonary artery extending into segmental branches to all 3 lobes. On the left, pulmonary emboli extend from the distal main pulmonary artery, distal to the left upper lobe branch takes off, into multiple segmental branches of the lower and upper lobe, particularly the lingula and posterior basilar segment. The thoracic aorta is normal in caliber. There is mild partly calcified plaque. No dissection. There is prominence of the main pulmonary arteries, left measuring 2.7 cm and right measuring 2.7 cm. RV/LV ratio:  1.48 Thoracic inlet:  No mass or adenopathy. Mediastinum and hila: Heart normal in size. Mild coronary artery calcifications. There are scattered sub cm mediastinal lymph nodes. Mildly enlarged subcarinal node measuring 15 mm in short axis. No other enlarged mediastinal nodes. No mediastinal or hilar masses. No hilar adenopathy. Lungs and pleura: There is coarse reticular and patchy airspace opacity as well some hazy airspace opacity in the dependent lower lobes with milder reticular and hazy opacity noted in the right upper lobe. There is no pulmonary edema. Minimal right pleural effusion. No pneumothorax. Limited upper abdomen:  Unremarkable. Musculoskeletal: Mild thoracic spine degenerative change. Bones demineralized. No osteoblastic or osteolytic lesions. Review of the MIP images confirms the above findings.  IMPRESSION: 1. Multiple bilateral pulmonary emboli with associated lung atelectasis. Minimal right effusion. 2. Positive for acute PE with CT evidence of right heart strain (RV/LV Ratio = 1.48) consistent with at least submassive (intermediate risk) PE. The presence of right heart strain has been associated with an increased risk of morbidity and mortality. Please activate Code PE by paging [phone]. Electronically Signed   By: Lajean Manes M.D.   On: 10/08/2015 20:55   Mr Lumbar Spine Wo Contrast  09/18/2015  ADDENDUM REPORT: 09/18/2015 19:58 ADDENDUM: Critical Value/emergent results were called by telephone at the time of interpretation on 09/18/2015 at 7:57 pm to Dr. Verdell Carmine , who verbally acknowledged these results. Electronically Signed   By: Franchot Gallo M.D.   On: 09/18/2015 19:58  09/18/2015  CLINICAL DATA:  Severe back pain.  Fall 3 days ago. EXAM: MRI LUMBAR SPINE WITHOUT CONTRAST TECHNIQUE: Multiplanar, multisequence MR imaging of the lumbar spine was performed. No intravenous contrast was administered. COMPARISON:  CT abdomen pelvis 09/15/2015 FINDINGS: Moderate compression fracture T12 with bone marrow edema. This appears acute or subacute. Retropulsion of bone into the canal touching the cord. There is a large epidural hematoma in the posterior spinal canal. This extends to T11 and may extend above T11. The hematoma extends down to the L1. There is compression of the cord and severe spinal stenosis due to bony retropulsion and the large hematoma. Hematoma presumably related to the recent fracture. The cord is compressed at T11 and T12. Scanning not performed above T11. L1-2: Disc degeneration and a large osteophyte on the left.  There is left foraminal encroachment. There is spinal stenosis on the left side but not the right. There is mild facet hypertrophy. L2-3: Mild retrolisthesis. Moderate disc degeneration and spurring. Bilateral facet hypertrophy. Moderate spinal stenosis. Severe  right foraminal encroachment and moderate left foraminal encroachment. L3-4: Mild retrolisthesis. Disc degeneration and spurring and bilateral facet hypertrophy. Mild spinal stenosis. Severe right foraminal encroachment due to spurring. L4-5: 4 mm anterior slip. Disc degeneration and disc bulging. Moderate facet hypertrophy. Moderate spinal stenosis. Moderate left foraminal encroachment L5-S1: Disc bulging. Bilateral facet hypertrophy. Moderate left foraminal narrowing. IMPRESSION: Acute/subacute compression fracture T12 with retropulsion of bone into the canal. There is a relatively large epidural hematoma posteriorly extending from T11 through L1 causing cord compression. The hematoma could extend above T11 but this area was not scanned. No cord signal abnormality. Extensive chronic degenerative changes throughout the lumbar spine as above. Multilevel spinal and foraminal stenosis. Electronically Signed: By: Franchot Gallo M.D. On: 09/18/2015 19:43   Ct Abdomen Pelvis W Contrast  09/15/2015  CLINICAL DATA:  Pain all over. Generalize weakness. Vomiting. Seen in the ED earlier today for dizziness and a fall. Persistent pain since that visit. EXAM: CT ABDOMEN AND PELVIS WITH CONTRAST TECHNIQUE: Multidetector CT imaging of the abdomen and pelvis was performed using the standard protocol following bolus administration of intravenous contrast. CONTRAST:  166mL OMNIPAQUE IOHEXOL 300 MG/ML  SOLN COMPARISON:  None. FINDINGS: Dependent atelectasis and fibrosis in the lung bases. Calcification in the coronary arteries. Small esophageal hiatal hernia. Mild diffuse fatty infiltration in the liver. Surgical absence of the gallbladder. Mild intra and extrahepatic bile duct dilatation is likely normal for postoperative physiology. Mildly dilated pancreatic duct. No obstructing lesion or stone is identified. Correlation with liver enzymes is recommended and liver enzymes is recommended and if positive, consider follow-up with  elective MRI/MRCP to exclude focal obstructing lesion. Tiny sub cm lesion in the spleen probably representing small cyst or hemangioma. Adrenal glands are unremarkable. Mild right hydronephrosis and proximal hydroureter extending to about the level of L5. The ureter is decompressed distal to that area. No obstructing stone or lesion is identified. This could represent stricture or occult non radiopaque stone. Nephrograms are mostly symmetrical. Mild areas of scarring in the kidneys. Calcification of abdominal aorta without aneurysm. Inferior vena cava and retroperitoneal lymph nodes are unremarkable. Stomach is not abnormally distended. Redundant colon is filled with gas and stool. Borderline colonic distention is likely due to constipation. No wall thickening. No free air or free fluid in the abdomen. No free air or free fluid in the abdomen. Pelvis: Uterus and ovaries are not enlarged. Stone versus dystrophic calcification in the region of the bladder neck. No free or loculated pelvic fluid collections. No pelvic mass or lymphadenopathy. Appendix is not identified. Diffuse degenerative changes throughout the lumbar spine. Slight anterior subluxation of L4 on L5. Compression deformity at T12. No destructive bone lesions. IMPRESSION: Dilated intra and extrahepatic bile ducts as well as pancreatic duct. This may be physiologic but correlation with liver enzymes is suggested. If positive, consider follow-up with elective MRI/ MRCP. Mild right renal hydronephrosis and hydroureter to the mid ureteral region. No obstructing stone or lesion identified but occult stone or stricture not excluded. Redundant stool-filled colon suggesting constipation. Calcification in the bladder neck may be a stone or dystrophic calcification. Electronically Signed   By: Lucienne Capers M.D.   On: 09/15/2015 23:30   US Venous Img Lower Bilateral  10/09/2015  CLINICAL DATA:  Pulmonary embolism. EXAM:  BILATERAL LOWER EXTREMITY VENOUS  DOPPLER ULTRASOUND TECHNIQUE: Gray-scale sonography with graded compression, as well as color Doppler and duplex ultrasound were performed to evaluate the lower extremity deep venous systems from the level of the common femoral vein and including the common femoral, femoral, profunda femoral, popliteal and calf veins including the posterior tibial, peroneal and gastrocnemius veins when visible. The superficial great saphenous vein was also interrogated. Spectral Doppler was utilized to evaluate flow at rest and with distal augmentation maneuvers in the common femoral, femoral and popliteal veins. COMPARISON:  Previous left leg venous Doppler ultrasounds. FINDINGS: RIGHT LOWER EXTREMITY Common Femoral Vein: No evidence of thrombus. Normal compressibility, respiratory phasicity and response to augmentation. Saphenofemoral Junction: No evidence of thrombus. Normal compressibility and flow on color Doppler imaging. Profunda Femoral Vein: No evidence of thrombus. Normal compressibility and flow on color Doppler imaging. Femoral Vein: Occlusive and nonocclusive thrombus. Popliteal Vein: Occlusive thrombus. Calf Veins: No evidence of thrombus. Normal compressibility and flow on color Doppler imaging. Superficial Great Saphenous Vein: No evidence of thrombus. Normal compressibility and flow on color Doppler imaging. Venous Reflux:  None. Other Findings:  None. LEFT LOWER EXTREMITY Common Femoral Vein: No evidence of thrombus. Normal compressibility, respiratory phasicity and response to augmentation. Saphenofemoral Junction: No evidence of thrombus. Normal compressibility and flow on color Doppler imaging. Profunda Femoral Vein: No evidence of thrombus. Normal compressibility and flow on color Doppler imaging. Femoral Vein: No evidence of thrombus. Normal compressibility, respiratory phasicity and response to augmentation. Popliteal Vein: No evidence of thrombus. Normal compressibility, respiratory phasicity and response to  augmentation. Calf Veins: No evidence of thrombus. Normal compressibility and flow on color Doppler imaging. Superficial Great Saphenous Vein: No evidence of thrombus. Normal compressibility and flow on color Doppler imaging. Venous Reflux:  None. Other Findings:  None. IMPRESSION: Deep venous thrombosis in the right femoral and popliteal veins. Electronically Signed   By: Claudie Revering M.D.   On: 10/09/2015 12:17   Dg Chest Port 1 View  09/16/2015  CLINICAL DATA:  Decreased oxygen saturation on room air. EXAM: PORTABLE CHEST 1 VIEW COMPARISON:  09/15/2015 FINDINGS: Normal heart size and pulmonary vascularity. No focal airspace disease or consolidation in the lungs. No blunting of costophrenic angles. No pneumothorax. Mediastinal contours appear intact. Calcified and tortuous aorta. Prominent right paratracheal shadow is likely vascular and unchanged since previous study. IMPRESSION: No active disease. Electronically Signed   By: Lucienne Capers M.D.   On: 09/16/2015 00:46   Dg Chest Portable 1 View  09/15/2015  CLINICAL DATA:  Posterior left chest pain secondary to a fall this morning. EXAM: PORTABLE CHEST 1 VIEW COMPARISON:  06/05/2005 FINDINGS: Heart size and pulmonary vascularity are normal. Lungs are clear. Tortuosity and calcification of the thoracic aorta. No acute osseous abnormality. IMPRESSION: No acute abnormalities.  Aortic atherosclerosis. Electronically Signed   By: Lorriane Shire M.D.   On: 09/15/2015 12:04      Assessment/Plan 1. Submassive PE. On anticoagulation. IVC filter an option due to high risk of mortality with any further PE and residual DVT present which is pretty large volume.  Family to consider and daughter who is a Marine scientist to talk to the son who is a physician and they will decide today. If filter desired, will place tomorrow 2. RLE DVT. Pretty extensive, but with age and recent surgery no role for thrombolysis and thrombectomy.  IVC filter an option as above 3. Recent  fall and back surgery.  No thrombolysis at this time 4. Trigeminal neuralgia.  On tegretol which may complicate anticoagulation regimen   Burr Soffer, MD  10/10/2015 10:44 AM

## 2015-10-10 NOTE — Progress Notes (Signed)
PT Cancellation Note  Patient Details Name: Jessica Erickson MRN: [id number] DOB: Oct 18, 1926   Cancelled Treatment:    Reason Eval/Treat Not Completed: Medical issues which prohibited therapy  Patient came to ED with shortness of breath. Was found to have bilateral PEs and DVT in RLE. Patient is currently on bed rest and not appropriate for PT at this time. Will re-attempt PT eval once patient is appropriate.   Hopkins,Alize Acy PT, DPT 10/10/2015, 12:54 PM

## 2015-10-10 NOTE — Consult Note (Signed)
ANTICOAGULATION CONSULT NOTE - Initial Consult  Pharmacy Consult for warfarin Indication: DVT/PE  Allergies  Allergen Reactions  . Penicillins Shortness Of Breath, Rash and Other (See Comments)    Has patient had a PCN reaction causing immediate rash, facial/tongue/throat swelling, SOB or lightheadedness with hypotension: Yes Has patient had a PCN reaction causing severe rash involving mucus membranes or skin necrosis: No Has patient had a PCN reaction that required hospitalization No Has patient had a PCN reaction occurring within the last 10 years: No If all of the above answers are "NO", then may proceed with Cephalosporin use.  . Sulfa Antibiotics Shortness Of Breath and Rash  . Aspirin Other (See Comments)    Reaction:  Unknown     Patient Measurements: Height: 5\' 9"  (175.3 cm) Weight: 181 lb (82.101 kg) IBW/kg (Calculated) : 66.2 Heparin Dosing Weight:   Vital Signs: Temp: 99.5 F (37.5 C) (11/05 2045) Temp Source: Oral (11/05 2045) BP: 110/47 mmHg (11/05 2045) Pulse Rate: 72 (11/05 2045)  Labs:  Recent Labs  10/08/15 1605 10/09/15 0454 10/09/15 1704 10/10/15 0353  HGB 12.4  --   --  10.9*  HCT 38.0  --   --  33.2*  PLT 180  --   --  166  LABPROT  --   --  15.3* 15.7*  INR  --   --  1.19 1.23  CREATININE 0.90 0.71  --   --   TROPONINI <0.03  --   --   --     Estimated Creatinine Clearance: 56.8 mL/min (by C-G formula based on Cr of 0.71).   Medical History: Past Medical History  Diagnosis Date  . History of kidney problems   . Diabetes ([place])   . Asthma   . Osteoarthritis   . Bilateral swelling of feet     and legs  . Neuropathy ([facility])     Medications:  Scheduled:  . atorvastatin  40 mg Oral Daily  . carbamazepine  200 mg Oral BID  . ciprofloxacin  400 mg Intravenous Q12H  . cloNIDine  0.1 mg Oral QHS  . enoxaparin (LOVENOX) injection  1 mg/kg Subcutaneous Q12H  . feeding supplement (GLUCERNA SHAKE)  237 mL Oral TID WC  . insulin aspart  0-5  Units Subcutaneous QHS  . insulin aspart  0-9 Units Subcutaneous TID WC  . levothyroxine  150 mcg Oral QAC breakfast  . lisinopril  10 mg Oral Daily  . pantoprazole (PROTONIX) IV  40 mg Intravenous Q12H  . polyethylene glycol  17 g Oral Daily  . sodium chloride  3 mL Intravenous Q12H  . tiotropium  18 mcg Inhalation Daily  . warfarin  2.5 mg Oral q1800  . Warfarin - Pharmacist Dosing Inpatient   Does not apply q1800    Assessment: Pt is an 81 year old female with a DVT and PE. Due to interaction with Carbamazepine cannot use NOACs. Pharmacy consulted to dose warfarin. Pt currently on lovenox 1mg /kg q 12 hours.   Goal of Therapy:  INR 2-3 Monitor platelets by anticoagulation protocol: Yes   Plan:  Due to pt age, will start with 2.5mg  daily. Due to Carbamazepine drug interaction, may require higher warfarin dose, but will continue to monitor. Continue lovenox 1mg /kg for atleast 5 days and until INR is therapeutic x 2. Recheck INR in the AM. Adjust as needed.   11/6 AM INR 1.23. Continue 2.5 mg daily. Daily INR ordered.   Ramond Dial, Pharm.D Clinical Pharmacist   10/10/2015,4:45  AM

## 2015-10-10 NOTE — Progress Notes (Signed)
[facility] [place] [facility] [place] NAME: Jessica Erickson    MR#:  [id number]  DATE OF BIRTH:  05/21/1926  SUBJECTIVE:  CHIEF COMPLAINT:   Chief Complaint  Patient presents with  . Shortness of Breath   a little hemoptysis this morning,  no shortness of breath.  REVIEW OF SYSTEMS:  CONSTITUTIONAL: No fever, has generalized weakness.  EYES: No blurred or double vision.  EARS, NOSE, AND THROAT: No tinnitus or ear pain.  RESPIRATORY: [place], no shortness of breath or wheezing but has hemoptysis.  CARDIOVASCULAR: No chest pain, orthopnea, edema.  GASTROINTESTINAL: No nausea, vomiting, diarrhea or abdominal pain.  GENITOURINARY: No dysuria, hematuria.  ENDOCRINE: No polyuria, nocturia,  HEMATOLOGY: No anemia, easy bruising or bleeding SKIN: No rash or lesion. MUSCULOSKELETAL: No joint pain or arthritis.   NEUROLOGIC: No tingling, numbness, weakness.  PSYCHIATRY: No anxiety or depression.   DRUG ALLERGIES:   Allergies  Allergen Reactions  . Penicillins Shortness Of Breath, Rash and Other (See Comments)    Has patient had a PCN reaction causing immediate rash, facial/tongue/throat swelling, SOB or lightheadedness with hypotension: Yes Has patient had a PCN reaction causing severe rash involving mucus membranes or skin necrosis: No Has patient had a PCN reaction that required hospitalization No Has patient had a PCN reaction occurring within the last 10 years: No If all of the above answers are "NO", then may proceed with Cephalosporin use.  . Sulfa Antibiotics Shortness Of Breath and Rash  . Aspirin Other (See Comments)    Reaction:  Unknown     VITALS:  Blood pressure 129/50, pulse 72, temperature 98.6 F (37 C), temperature source Oral, resp. rate 24, height 5\' 9"  (1.753 m), weight 82.101 kg (181 lb), SpO2 93 %.  PHYSICAL EXAMINATION:  GENERAL:  81 y.o.-year-old patient lying in the bed with no acute distress.  EYES: Pupils equal, round,  reactive to light and accommodation. No scleral icterus. Extraocular muscles intact.  HEENT: Head atraumatic, normocephalic. Oropharynx and nasopharynx clear. Dry oral mucosa. NECK:  Supple, no jugular venous distention. No thyroid enlargement, no tenderness.  LUNGS: Normal breath sounds bilaterally, no wheezing, rales,rhonchi or crepitation. No use of accessory muscles of respiration.  CARDIOVASCULAR: S1, S2 normal. Systolic murmurs 1/6, rubs, or gallops.  ABDOMEN: Soft, nontender, nondistended. Bowel sounds present. No organomegaly or mass.  EXTREMITIES: trace leg edema, no cyanosis, or clubbing. But has right-sided calf sore. NEUROLOGIC: Cranial nerves II through XII are intact. Muscle strength 4/5 in all extremities. Sensation intact. Gait not checked.  PSYCHIATRIC: The patient is alert and oriented x 3.  SKIN: No obvious rash, lesion, or ulcer.    LABORATORY PANEL:   CBC  Recent Labs Lab 10/10/15 0353  WBC 8.2  HGB 10.9*  HCT 33.2*  PLT 166   ------------------------------------------------------------------------------------------------------------------  Chemistries   Recent Labs Lab 10/09/15 0454  NA 137  K 3.9  CL 106  CO2 26  GLUCOSE 127*  BUN 12  CREATININE 0.71  CALCIUM 8.0*  AST 13*  ALT 11*  ALKPHOS 83  BILITOT 0.4   ------------------------------------------------------------------------------------------------------------------  Cardiac Enzymes  Recent Labs Lab 10/08/15 1605  TROPONINI <0.03   ------------------------------------------------------------------------------------------------------------------  RADIOLOGY:  Dg Chest 2 View  10/08/2015  CLINICAL DATA:  Shortness of breath. Clinical concern for a spontaneous pneumothorax. Right chest pain this morning. Hemoptysis and hypoxia. EXAM: CHEST  2 VIEW COMPARISON:  Previous examinations, the most recent dated 09/16/2015. FINDINGS: Poor inspiration. Normal sized heart. Stable mild linear  density in the right lower lung zone and mildly increased linear density at the left lung base. Mildly prominent interstitial markings. Thoracolumbar vertebral compression deformity and associated kyphoplasty material. There is linear density extending posteriorly from this area, possibly representing kyphoplasty material in the injection tract. IMPRESSION: 1. Poor inspiration with mild left basilar atelectasis. 2. Stable linear scarring at the right lung base. 3. Stable mild chronic interstitial lung disease. Electronically Signed   By: Claudie Revering M.D.   On: 10/08/2015 16:33   Ct Angio Chest Pe W/cm &/or Wo Cm  10/08/2015  CLINICAL DATA:  Shortness of breath. Patient with history of COPD, hypertension and diabetes. Patient treated with kyphoplasty on 09/21/2015 following a fall. Patient has been undergoing medial dilatation. Patient had episode of nausea vomiting yesterday. Hemoptysis reported today. EXAM: CT ANGIOGRAPHY CHEST WITH CONTRAST TECHNIQUE: Multidetector CT imaging of the chest was performed using the standard protocol during bolus administration of intravenous contrast. Multiplanar CT image reconstructions and MIPs were obtained to evaluate the vascular anatomy. CONTRAST:  113mL OMNIPAQUE IOHEXOL 350 MG/ML SOLN COMPARISON:  Current chest radiograph. FINDINGS: Angiographic study: There multiple bilateral pulmonary emboli. On the right, emboli are noted along the distal main pulmonary artery extending into segmental branches to all 3 lobes. On the left, pulmonary emboli extend from the distal main pulmonary artery, distal to the left upper lobe branch takes off, into multiple segmental branches of the lower and upper lobe, particularly the lingula and posterior basilar segment. The thoracic aorta is normal in caliber. There is mild partly calcified plaque. No dissection. There is prominence of the main pulmonary arteries, left measuring 2.7 cm and right measuring 2.7 cm. RV/LV ratio:  1.48 Thoracic  inlet:  No mass or adenopathy. Mediastinum and hila: Heart normal in size. Mild coronary artery calcifications. There are scattered sub cm mediastinal lymph nodes. Mildly enlarged subcarinal node measuring 15 mm in short axis. No other enlarged mediastinal nodes. No mediastinal or hilar masses. No hilar adenopathy. Lungs and pleura: There is coarse reticular and patchy airspace opacity as well some hazy airspace opacity in the dependent lower lobes with milder reticular and hazy opacity noted in the right upper lobe. There is no pulmonary edema. Minimal right pleural effusion. No pneumothorax. Limited upper abdomen:  Unremarkable. Musculoskeletal: Mild thoracic spine degenerative change. Bones demineralized. No osteoblastic or osteolytic lesions. Review of the MIP images confirms the above findings. IMPRESSION: 1. Multiple bilateral pulmonary emboli with associated lung atelectasis. Minimal right effusion. 2. Positive for acute PE with CT evidence of right heart strain (RV/LV Ratio = 1.48) consistent with at least submassive (intermediate risk) PE. The presence of right heart strain has been associated with an increased risk of morbidity and mortality. Please activate Code PE by paging [phone]. Electronically Signed   By: Lajean Manes M.D.   On: 10/08/2015 20:55   US Venous Img Lower Bilateral  10/09/2015  CLINICAL DATA:  Pulmonary embolism. EXAM: BILATERAL LOWER EXTREMITY VENOUS DOPPLER ULTRASOUND TECHNIQUE: Gray-scale sonography with graded compression, as well as color Doppler and duplex ultrasound were performed to evaluate the lower extremity deep venous systems from the level of the common femoral vein and including the common femoral, femoral, profunda femoral, popliteal and calf veins including the posterior tibial, peroneal and gastrocnemius veins when visible. The superficial great saphenous vein was also interrogated. Spectral Doppler was utilized to evaluate flow at rest and with distal  augmentation maneuvers in the common femoral, femoral and popliteal veins. COMPARISON:  Previous left leg venous  Doppler ultrasounds. FINDINGS: RIGHT LOWER EXTREMITY Common Femoral Vein: No evidence of thrombus. Normal compressibility, respiratory phasicity and response to augmentation. Saphenofemoral Junction: No evidence of thrombus. Normal compressibility and flow on color Doppler imaging. Profunda Femoral Vein: No evidence of thrombus. Normal compressibility and flow on color Doppler imaging. Femoral Vein: Occlusive and nonocclusive thrombus. Popliteal Vein: Occlusive thrombus. Calf Veins: No evidence of thrombus. Normal compressibility and flow on color Doppler imaging. Superficial Great Saphenous Vein: No evidence of thrombus. Normal compressibility and flow on color Doppler imaging. Venous Reflux:  None. Other Findings:  None. LEFT LOWER EXTREMITY Common Femoral Vein: No evidence of thrombus. Normal compressibility, respiratory phasicity and response to augmentation. Saphenofemoral Junction: No evidence of thrombus. Normal compressibility and flow on color Doppler imaging. Profunda Femoral Vein: No evidence of thrombus. Normal compressibility and flow on color Doppler imaging. Femoral Vein: No evidence of thrombus. Normal compressibility, respiratory phasicity and response to augmentation. Popliteal Vein: No evidence of thrombus. Normal compressibility, respiratory phasicity and response to augmentation. Calf Veins: No evidence of thrombus. Normal compressibility and flow on color Doppler imaging. Superficial Great Saphenous Vein: No evidence of thrombus. Normal compressibility and flow on color Doppler imaging. Venous Reflux:  None. Other Findings:  None. IMPRESSION: Deep venous thrombosis in the right femoral and popliteal veins. Electronically Signed   By: Claudie Revering M.D.   On: 10/09/2015 12:17    EKG:   Orders placed or performed during the hospital encounter of 10/08/15  . ED EKG  . ED EKG  .  EKG 12-Lead  . EKG 12-Lead  . EKG 12-Lead  . EKG 12-Lead    ASSESSMENT AND PLAN:   1. Bilateral PE and Right femoral and popliteal DVT. Started coumadin last night and on Lovenox 1 mg/kg every 12 hours. Possible IVC filter placement tomorrow per Dr. Lucky Cowboy.   Mild pulmonary hypertension and ejection fraction 55% per echocardiogram.  Since there are interaction between oral anticoagulation medication and Tegretol, I discussed with the patient's daughter and son yesterday, they agreed to start Coumadin.  Follow-up hemoglobin and INR.  2. UTI, continue Cipro, follow-up urine cultures: Gram-negative rods.   Leukocytosis. Hospital due to reaction or UTI. Discontinued vancomycin and Azactam. Improved.  3. Back pain, continue outpatient medications 4. History of COPD per patient and her daughter. Stable. Continue Spiriva. 5. DM2, continue sliding scale.   D/w Dr. Lucky Cowboy. All the records are reviewed and case discussed with Care Management/Social Workerr. Management plans discussed with the patient, her daughter and they are in agreement. Greater than 50% time was spent on coordination of care and face-to-face counseling.  CODE STATUS: Full code  TOTAL TIME TAKING CARE OF THIS PATIENT: 46 minutes.   POSSIBLE D/C 2 SKILLED NURSING FACILITY IN 2-3 DAYS, DEPENDING ON CLINICAL CONDITION.   Demetrios Loll M.D on 10/10/2015 at 1:36 PM  Between 7am to 6pm - Pager - [phone]  After 6pm go to [email or web address] - password [email or web address] [facility]  Office  [phone]  CC: Primary care physician; Volanda Napoleon, MD

## 2015-10-11 ENCOUNTER — Encounter
Admission: EM | Disposition: A | Discharge status: Skilled Nursing Facility | Payer: Self-pay | Source: Home / Self Care | Attending: Internal Medicine

## 2015-10-11 ENCOUNTER — Encounter: Payer: Self-pay | Admitting: *Deleted

## 2015-10-11 HISTORY — PX: PERIPHERAL VASCULAR CATHETERIZATION: SHX172C

## 2015-10-11 LAB — URINE CULTURE: Culture: >=100000

## 2015-10-11 LAB — GLUCOSE, CAPILLARY
Glucose-Capillary: 147 mg/dL — ABNORMAL HIGH (ref 65–99)
Glucose-Capillary: 105 mg/dL — ABNORMAL HIGH (ref 65–99)
Glucose-Capillary: 149 mg/dL — ABNORMAL HIGH (ref 65–99)
Glucose-Capillary: 119 mg/dL — ABNORMAL HIGH (ref 65–99)

## 2015-10-11 LAB — PROTIME-INR
INR: 1.17
Prothrombin Time: 15.1 seconds — ABNORMAL HIGH (ref 11.4–15.0)

## 2015-10-11 SURGERY — IVC FILTER INSERTION
Anesthesia: Moderate Sedation

## 2015-10-11 MED ORDER — IOHEXOL 300 MG/ML  SOLN
INTRAMUSCULAR | Status: DC | PRN
  Administered 2015-10-11: 15 mL via INTRAVENOUS

## 2015-10-11 MED ORDER — FENTANYL CITRATE (PF) 100 MCG/2ML IJ SOLN
INTRAMUSCULAR | Status: DC | PRN
  Administered 2015-10-11: 50 ug via INTRAVENOUS

## 2015-10-11 MED ORDER — MIDAZOLAM HCL 2 MG/2ML IJ SOLN
INTRAMUSCULAR | Status: DC | PRN
  Administered 2015-10-11: 2 mg via INTRAVENOUS

## 2015-10-11 MED ORDER — CHLORHEXIDINE GLUCONATE CLOTH 2 % EX PADS: 6.0000 | MEDICATED_PAD | Freq: Once | CUTANEOUS | Status: DC

## 2015-10-11 MED ORDER — CIPROFLOXACIN HCL 500 MG PO TABS
500.0000 mg | ORAL_TABLET | Freq: Two times a day (BID) | ORAL | Status: DC
  Administered 2015-10-11 – 2015-10-13 (×4): 500 mg via ORAL
  Filled 2015-10-11 (×4): qty 1

## 2015-10-11 MED ORDER — HEPARIN (PORCINE) IN NACL 2-0.9 UNIT/ML-% IJ SOLN
INTRAMUSCULAR | Status: AC
  Filled 2015-10-11: qty 500

## 2015-10-11 MED ORDER — MIDAZOLAM HCL 5 MG/5ML IJ SOLN
INTRAMUSCULAR | Status: AC
  Filled 2015-10-11: qty 5

## 2015-10-11 MED ORDER — WARFARIN SODIUM 5 MG PO TABS
5.0000 mg | ORAL_TABLET | Freq: Once | ORAL | Status: AC
  Administered 2015-10-11: 5 mg via ORAL
  Filled 2015-10-11: qty 1

## 2015-10-11 MED ORDER — WARFARIN SODIUM 1 MG PO TABS: 3.0000 mg | ORAL_TABLET | Freq: Every day | ORAL | Status: DC

## 2015-10-11 MED ORDER — LIDOCAINE-EPINEPHRINE (PF) 1 %-1:200000 IJ SOLN
INTRAMUSCULAR | Status: AC
  Filled 2015-10-11: qty 30

## 2015-10-11 MED ORDER — FENTANYL CITRATE (PF) 100 MCG/2ML IJ SOLN
INTRAMUSCULAR | Status: AC
  Filled 2015-10-11: qty 2

## 2015-10-11 MED ORDER — LACTULOSE 10 GM/15ML PO SOLN: 20.0000 g | Freq: Every day | ORAL | Status: DC | PRN

## 2015-10-11 MED ORDER — CLINDAMYCIN PHOSPHATE 300 MG/50ML IV SOLN
INTRAVENOUS | Status: AC
  Administered 2015-10-11: 11:00:00
  Filled 2015-10-11: qty 50

## 2015-10-11 MED ORDER — SODIUM CHLORIDE 0.9 % IV SOLN
INTRAVENOUS | Status: DC
  Administered 2015-10-11: 13:00:00 via INTRAVENOUS

## 2015-10-11 MED ORDER — DOCUSATE SODIUM 100 MG PO CAPS
100.0000 mg | ORAL_CAPSULE | Freq: Two times a day (BID) | ORAL | Status: DC
  Administered 2015-10-11 – 2015-10-13 (×3): 100 mg via ORAL
  Filled 2015-10-11 (×5): qty 1

## 2015-10-11 SURGICAL SUPPLY — 3 items
FILTER VC CELECT-FEMORAL (Filter) ×2 IMPLANT
PACK ANGIOGRAPHY (CUSTOM PROCEDURE TRAY) ×2 IMPLANT
WIRE J 3MM .035X145CM (WIRE) ×2 IMPLANT

## 2015-10-11 NOTE — Clinical Documentation Improvement (Signed)
Internal Medicine  Can the diagnosis of hypoxia be further clarified? Please update your documentation within the medical record to reflect your response to this query. Thank you!   Acute Hypoxic Respiratory Failure  Acute Hypoxic Respiratory Distress  Other Condition  Clinically Undetermined  Supporting Information:  Tachypneic with RR ranging from 28 to 30  Oxygen Saturations at 90%  Placed in 3L FiO2 with saturations improving to 97%  Please exercise your independent, professional judgment when responding. A specific answer is not anticipated or expected.  Thank You, Zoila Shutter RN, [facility] [phone]; Cell: [phone]

## 2015-10-11 NOTE — H&P (Signed)
  [facility] History & Physical Update  The patient was interviewed and re-examined.  The patient's previous History and Physical has been reviewed and is unchanged.  The family has decided to proceed with IVC filter placement today. We plan to proceed with the scheduled procedure.  DEW,JASON, MD  10/11/2015, 10:36 AM

## 2015-10-11 NOTE — Progress Notes (Signed)
PT Cancellation Note  Patient Details Name: Jessica Erickson MRN: [id number] DOB: [birth year]   Cancelled Treatment:    Reason Eval/Treat Not Completed: Medical issues which prohibited therapy. PT reviewed patient's chart, noted to have bilateral PEs and DVT and is possibly scheduled for IVC filter placement today. PT will confirm, and re-attempt mobility evaluation after IVC filter placed.  Kerman Passey, PT, DPT    10/11/2015, 8:16 AM

## 2015-10-11 NOTE — Progress Notes (Signed)
Pt doing well post procedure,. Awake, alert, vss, sats stable, left groin site without bleeding nor hematoma, report callted to care nurse on [phone], with plan reviewed, pt denies complaints,

## 2015-10-11 NOTE — Consult Note (Signed)
ANTICOAGULATION CONSULT NOTE - Initial Consult  Pharmacy Consult for warfarin Indication: DVT/PE  Allergies  Allergen Reactions  . Penicillins Shortness Of Breath, Rash and Other (See Comments)    Has patient had a PCN reaction causing immediate rash, facial/tongue/throat swelling, SOB or lightheadedness with hypotension: Yes Has patient had a PCN reaction causing severe rash involving mucus membranes or skin necrosis: No Has patient had a PCN reaction that required hospitalization No Has patient had a PCN reaction occurring within the last 10 years: No If all of the above answers are "NO", then may proceed with Cephalosporin use.  . Sulfa Antibiotics Shortness Of Breath and Rash  . Aspirin Other (See Comments)    Reaction:  Unknown     Patient Measurements: Height: 5\' 9"  (175.3 cm) Weight: 181 lb (82.101 kg) IBW/kg (Calculated) : 66.2 Heparin Dosing Weight:   Vital Signs: Temp: 98.7 F (37.1 C) (11/07 0439) Temp Source: Oral (11/07 0439) BP: 140/59 mmHg (11/07 0439) Pulse Rate: 67 (11/07 0439)  Labs:  Recent Labs  10/08/15 1605 10/09/15 0454 10/09/15 1704 10/10/15 0353 10/11/15 0517  HGB 12.4  --   --  10.9*  --   HCT 38.0  --   --  33.2*  --   PLT 180  --   --  166  --   LABPROT  --   --  15.3* 15.7* 15.1*  INR  --   --  1.19 1.23 1.17  CREATININE 0.90 0.71  --   --   --   TROPONINI <0.03  --   --   --   --     Estimated Creatinine Clearance: 56.8 mL/min (by C-G formula based on Cr of 0.71).   Medical History: Past Medical History  Diagnosis Date  . History of kidney problems   . Diabetes ([place])   . Asthma   . Osteoarthritis   . Bilateral swelling of feet     and legs  . Neuropathy ([facility])     Medications:  Scheduled:  . atorvastatin  40 mg Oral Daily  . carbamazepine  200 mg Oral BID  . ciprofloxacin  400 mg Intravenous Q12H  . cloNIDine  0.1 mg Oral QHS  . enoxaparin (LOVENOX) injection  1 mg/kg Subcutaneous Q12H  . feeding supplement (GLUCERNA  SHAKE)  237 mL Oral TID WC  . insulin aspart  0-5 Units Subcutaneous QHS  . insulin aspart  0-9 Units Subcutaneous TID WC  . levothyroxine  150 mcg Oral QAC breakfast  . lisinopril  10 mg Oral Daily  . pantoprazole (PROTONIX) IV  40 mg Intravenous Q12H  . polyethylene glycol  17 g Oral Daily  . sodium chloride  3 mL Intravenous Q12H  . tiotropium  18 mcg Inhalation Daily  . warfarin  2.5 mg Oral q1800  . Warfarin - Pharmacist Dosing Inpatient   Does not apply q1800    Assessment: Pt is an 81 year old female with a DVT and PE. Due to interaction with Carbamazepine cannot use NOACs. Pharmacy consulted to dose warfarin. Pt currently on lovenox 1mg /kg q 12 hours.   Goal of Therapy:  INR 2-3 Monitor platelets by anticoagulation protocol: Yes   Plan:  Due to pt age, will start with 2.5mg  daily. Due to Carbamazepine drug interaction, may require higher warfarin dose, but will continue to monitor. Continue lovenox 1mg /kg for atleast 5 days and until INR is therapeutic x 2. Recheck INR in the AM. Adjust as needed.   11/6 AM INR  1.23. Continue 2.5 mg daily. Daily INR ordered.  11/7 INR 1.17. Continue 2.5 mg daily. May consider increasing dose tomorrow if no significant change in INR.   Sim Boast, PharmD, BCPS  10/11/2015

## 2015-10-11 NOTE — Op Note (Signed)
[facility]   OPERATIVE NOTE    PRE-OPERATIVE DIAGNOSIS: submassive PE and residual RLE DVT  POST-OPERATIVE DIAGNOSIS: submassive PE and residual RLE DVT  PROCEDURE: 1.   Ultrasound guidance for vascular access to the left femoral vein 2.   Catheter placement into the inferior vena cava 3.   Inferior venacavogram 4.   Placement of a Cook Celect IVC filter  SURGEON: Leotis Pain, MD  ASSISTANT(S): None  ANESTHESIA: local/sedation  ESTIMATED BLOOD LOSS: minimal  FINDING(S): 1.  None  SPECIMEN(S):  none  INDICATIONS:   Jessica Erickson is a 81 y.o. female who presents with submassive PE and large volume residual RLE DVT.  Inferior vena cava filter is indicated for this reason.  Risks and benefits including filter thrombosis, migration, fracture, bleeding, and infection were all discussed.  We discussed that all IVC filters that we place can be removed if desired from the patient once the need for the filter has passed.    DESCRIPTION: After obtaining full informed written consent, the patient was brought back to the vascular suite. The skin was sterilely prepped and draped in a sterile surgical field was created. The left femoral vein was accessed under direct ultrasound guidance without difficulty with a Seldinger needle and a J-wire was then placed. After skin nick and dilatation, the delivery sheath was placed into the inferior vena cava and an inferior venacavogram was performed. This demonstrated a patent IVC with the level of the renal veins at the top of L1.  The filter was then deployed into the inferior vena cava at the level of the L1-L2 interspace just below the renal veins. The delivery sheath was then removed. Pressure was held. Sterile dressings were placed. The patient tolerated the procedure well and was taken to the recovery room in stable condition.  COMPLICATIONS: None  CONDITION: Stable  DEW,JASON  10/11/2015, 10:58 AM

## 2015-10-11 NOTE — Progress Notes (Signed)
[facility] [place] NAME: Jessica Erickson    MR#:  [id number]  DATE OF BIRTH:  [birth year]  SUBJECTIVE:  CHIEF COMPLAINT:   Chief Complaint  Patient presents with  . Shortness of Breath   a little hemoptysis this morning,  no shortness of breath.  REVIEW OF SYSTEMS:  CONSTITUTIONAL: No fever, has generalized weakness.  EYES: No blurred or double vision.  EARS, NOSE, AND THROAT: No tinnitus or ear pain.  RESPIRATORY: [place], no shortness of breath or wheezing but has hemoptysis.  CARDIOVASCULAR: No chest pain, orthopnea, edema.  GASTROINTESTINAL: No nausea, vomiting, diarrhea or abdominal pain.  GENITOURINARY: No dysuria, hematuria.  ENDOCRINE: No polyuria, nocturia,  HEMATOLOGY: No anemia, easy bruising or bleeding SKIN: No rash or lesion. MUSCULOSKELETAL: No joint pain or arthritis.   NEUROLOGIC: No tingling, numbness, weakness.  PSYCHIATRY: No anxiety or depression.   DRUG ALLERGIES:   Allergies  Allergen Reactions  . Penicillins Shortness Of Breath, Rash and Other (See Comments)    Has patient had a PCN reaction causing immediate rash, facial/tongue/throat swelling, SOB or lightheadedness with hypotension: Yes Has patient had a PCN reaction causing severe rash involving mucus membranes or skin necrosis: No Has patient had a PCN reaction that required hospitalization No Has patient had a PCN reaction occurring within the last 10 years: No If all of the above answers are "NO", then may proceed with Cephalosporin use.  . Sulfa Antibiotics Shortness Of Breath and Rash  . Aspirin Other (See Comments)    Reaction:  Unknown     VITALS:  Blood pressure 149/54, pulse 60, temperature 98.2 F (36.8 C), temperature source Oral, resp. rate 16, height 5\' 9"  (1.753 m), weight 82.101 kg (181 lb), SpO2 100 %.  PHYSICAL EXAMINATION:  GENERAL:  81 y.o.-year-old patient lying in the bed with no acute distress.  EYES: Pupils equal,  round, reactive to light and accommodation. No scleral icterus. Extraocular muscles intact.  HEENT: Head atraumatic, normocephalic. Oropharynx and nasopharynx clear. Dry oral mucosa. NECK:  Supple, no jugular venous distention. No thyroid enlargement, no tenderness.  LUNGS: Normal breath sounds bilaterally, no wheezing, rales,rhonchi or crepitation. No use of accessory muscles of respiration.  CARDIOVASCULAR: S1, S2 normal. Systolic murmurs 1/6, rubs, or gallops.  ABDOMEN: Soft, nontender, nondistended. Bowel sounds present. No organomegaly or mass.  EXTREMITIES: trace leg edema, no cyanosis, or clubbing. But has right-sided calf sore. NEUROLOGIC: Cranial nerves II through XII are intact. Muscle strength 4/5 in all extremities. Sensation intact. Gait not checked.  PSYCHIATRIC: The patient is alert and oriented x 3.  SKIN: No obvious rash, lesion, or ulcer.    LABORATORY PANEL:   CBC  Recent Labs Lab 10/10/15 0353  WBC 8.2  HGB 10.9*  HCT 33.2*  PLT 166   ------------------------------------------------------------------------------------------------------------------  Chemistries   Recent Labs Lab 10/09/15 0454  NA 137  K 3.9  CL 106  CO2 26  GLUCOSE 127*  BUN 12  CREATININE 0.71  CALCIUM 8.0*  AST 13*  ALT 11*  ALKPHOS 83  BILITOT 0.4   ------------------------------------------------------------------------------------------------------------------  Cardiac Enzymes  Recent Labs Lab 10/08/15 1605  TROPONINI <0.03   ------------------------------------------------------------------------------------------------------------------  RADIOLOGY:  No results found.  EKG:   Orders placed or performed during the hospital encounter of 10/08/15  . ED EKG  . ED EKG  . EKG 12-Lead  . EKG 12-Lead  . EKG 12-Lead  . EKG 12-Lead    ASSESSMENT AND PLAN:   1.  Bilateral PE and Right femoral and popliteal DVT. Continue Coumadin pharmacy to dose and on Lovenox 1  mg/kg every 12 hours. Status post IVC filter placement by Dr. Lucky Cowboy today.   Mild pulmonary hypertension and ejection fraction 55% per echocardiogram.  Since there are interaction between oral anticoagulation medication and Tegretol, I discussed with the patient's daughter and son, they agreed to start Coumadin.  Follow-up hemoglobin and INR.  * Acute respiratory distress with hypoxia. Try to wean off oxygen by nasal cannula.  2. UTI, continue Cipro, follow-up urine cultures: Escherichia coli and Proteus, both sensitive to Cipro.  Leukocytosis. Hospital due to reaction or UTI. Discontinued vancomycin and Azactam. Improved.  3. Back pain, continue outpatient medications 4. History of COPD per patient and her daughter. Stable. Continue Spiriva. 5. DM2, continue sliding scale.   All the records are reviewed and case discussed with Care Management/Social Workerr. Management plans discussed with the patient, her daughter and they are in agreement. Greater than 50% time was spent on coordination of care and face-to-face counseling.  CODE STATUS: Full code  TOTAL TIME TAKING CARE OF THIS PATIENT: 41 minutes.   PT.   POSSIBLE D/C 2 SKILLED NURSING FACILITY IN 3 DAYS, DEPENDING ON CLINICAL CONDITION.   Demetrios Loll M.D on 10/11/2015 at 4:15 PM  Between 7am to 6pm - Pager - [phone]  After 6pm go to [email or web address] - password [email or web address] [facility]  Office  [phone]  CC: Primary care physician; Volanda Napoleon, MD

## 2015-10-11 NOTE — Care Management (Signed)
Spoke with the patient and her daughter [name]. Patient is from [facility] and discharge plan would be to return there at discharge. Discussed with CSW Shela Leff.

## 2015-10-11 NOTE — NC FL2 (Signed)
[place] LEVEL OF CARE SCREENING TOOL     IDENTIFICATION  Patient Name: Jessica Erickson Birthdate: [birth year] Sex: female Admission Date (Current Location): 10/08/2015  [facility] [place] Number:     Facility and Address:  [facility], [address], [place], [place] [address]      Provider Number: [id number]  Attending Physician Name and Address:  Demetrios Loll, MD  Relative Name and Phone Number:       Current Level of Care: Hospital Recommended Level of Care: [place] Prior Approval Number:    Date Approved/Denied:   PASRR Number:    Discharge Plan: SNF    Current Diagnoses: Patient Active Problem List   Diagnosis Date Noted  . Hemoptysis 10/08/2015  . Healthcare-associated pneumonia 10/08/2015  . UTI (lower urinary tract infection) 10/08/2015  . Back pain 10/08/2015  . Vomiting 09/16/2015    Orientation ACTIVITIES/SOCIAL BLADDER RESPIRATION    Self, Situation, Place  Active Incontinent Normal  BEHAVIORAL SYMPTOMS/MOOD NEUROLOGICAL BOWEL NUTRITION STATUS   (none)  (none) Continent Diet  PHYSICIAN VISITS COMMUNICATION OF NEEDS Height & Weight Skin  30 days Verbally   181 lbs. Normal          AMBULATORY STATUS RESPIRATION    Assist extensive Normal      Personal Care Assistance Level of Assistance  Bathing, Dressing Bathing Assistance: Maximum assistance   Dressing Assistance: Maximum assistance      Functional Limitations Info                SPECIAL CARE FACTORS FREQUENCY  PT (By licensed PT)                   Additional Factors Info  Code Status, Allergies Code Status Info: full Allergies Info: sulfa, asa, pcn           Current Medications (10/11/2015): Current Facility-Administered Medications  Medication Dose Route Frequency Provider Last Rate Last Dose  . 0.9 %  sodium chloride infusion   Intravenous Continuous Algernon Huxley, MD      . acetaminophen (TYLENOL) tablet  650 mg  650 mg Oral Q6H PRN Theodoro Grist, MD       Or  . acetaminophen (TYLENOL) suppository 650 mg  650 mg Rectal Q6H PRN Theodoro Grist, MD      . acetaminophen (TYLENOL) tablet 650 mg  650 mg Oral Q6H PRN Theodoro Grist, MD   650 mg at 10/09/15 1819  . atorvastatin (LIPITOR) tablet 40 mg  40 mg Oral Daily Theodoro Grist, MD   40 mg at 10/11/15 0937  . carbamazepine (TEGRETOL) tablet 200 mg  200 mg Oral BID Theodoro Grist, MD   200 mg at 10/11/15 0939  . Chlorhexidine Gluconate Cloth 2 % PADS 6 each  6 each Topical Once Algernon Huxley, MD      . ciprofloxacin (CIPRO) IVPB 400 mg  400 mg Intravenous Q12H Demetrios Loll, MD   400 mg at 10/11/15 [id number]  . cloNIDine (CATAPRES) tablet 0.1 mg  0.1 mg Oral QHS Theodoro Grist, MD   0.1 mg at 10/10/15 2123  . enoxaparin (LOVENOX) injection 80 mg  1 mg/kg Subcutaneous Q12H Demetrios Loll, MD   80 mg at 10/11/15 0940  . feeding supplement (GLUCERNA SHAKE) (GLUCERNA SHAKE) liquid 237 mL  237 mL Oral TID WC Demetrios Loll, MD   237 mL at 10/10/15 1728  . guaifenesin (ROBITUSSIN) 100 MG/5ML syrup 200 mg  200 mg Oral Q4H PRN Rima  Vaickute, MD      . insulin aspart (novoLOG) injection 0-5 Units  0-5 Units Subcutaneous QHS Demetrios Loll, MD   0 Units at 10/09/15 2200  . insulin aspart (novoLOG) injection 0-9 Units  0-9 Units Subcutaneous TID WC Demetrios Loll, MD   1 Units at 10/11/15 0940  . ipratropium-albuterol (DUONEB) 0.5-2.5 (3) MG/3ML nebulizer solution 3 mL  3 mL Nebulization Q6H PRN Demetrios Loll, MD      . levothyroxine (SYNTHROID, LEVOTHROID) tablet 150 mcg  150 mcg Oral QAC breakfast Theodoro Grist, MD   150 mcg at 10/11/15 0949  . lisinopril (PRINIVIL,ZESTRIL) tablet 10 mg  10 mg Oral Daily Theodoro Grist, MD   10 mg at 10/11/15 0939  . loperamide (IMODIUM) capsule 2-4 mg  2-4 mg Oral PRN Theodoro Grist, MD      . ondansetron (ZOFRAN) tablet 4 mg  4 mg Oral Q6H PRN Theodoro Grist, MD       Or  . ondansetron (ZOFRAN) injection 4 mg  4 mg Intravenous Q6H PRN Theodoro Grist, MD      .  oxyCODONE (Oxy IR/ROXICODONE) immediate release tablet 5 mg  5 mg Oral Q4H PRN Theodoro Grist, MD      . pantoprazole (PROTONIX) injection 40 mg  40 mg Intravenous Q12H Theodoro Grist, MD   40 mg at 10/11/15 0941  . polyethylene glycol (MIRALAX / GLYCOLAX) packet 17 g  17 g Oral Daily Theodoro Grist, MD   17 g at 10/10/15 1101  . sodium chloride 0.9 % injection 3 mL  3 mL Intravenous Q12H Theodoro Grist, MD   3 mL at 10/11/15 0940  . tiotropium (SPIRIVA) inhalation capsule 18 mcg  18 mcg Inhalation Daily Theodoro Grist, MD   18 mcg at 10/11/15 0942  . warfarin (COUMADIN) tablet 3 mg  3 mg Oral q1800 Crystal G Scarpena, RPH      . Warfarin - Pharmacist Dosing Inpatient   Does not apply q1800 Melissa D Maccia, RPH      . zolpidem (AMBIEN) tablet 5 mg  5 mg Oral QHS PRN Theodoro Grist, MD   5 mg at 10/10/15 2131   Do not use this list as official medication orders. Please verify with discharge summary.  Discharge Medications:   Medication List    ASK your doctor about these medications        acetaminophen 325 MG tablet  Commonly known as:  TYLENOL  Take 2 tablets (650 mg total) by mouth every 6 (six) hours as needed.     CALCIUM 600+D 600-400 MG-UNIT tablet  Generic drug:  Calcium Carbonate-Vitamin D  Take 1 tablet by mouth daily.     carbamazepine 200 MG tablet  Commonly known as:  TEGRETOL  Take 200 mg by mouth 2 (two) times daily.     cloNIDine 0.1 MG tablet  Commonly known as:  CATAPRES  Take 0.1 mg by mouth at bedtime.     diphenhydramine-acetaminophen 25-500 MG Tabs tablet  Commonly known as:  TYLENOL PM  Take 1 tablet by mouth at bedtime as needed (for sleep).     fosinopril 10 MG tablet  Commonly known as:  MONOPRIL  Take 10 mg by mouth daily.     guaifenesin 100 MG/5ML syrup  Commonly known as:  ROBITUSSIN  Take 200 mg by mouth every 4 (four) hours as needed for cough.     LEVOXYL 150 MCG tablet  Generic drug:  levothyroxine  Take 150 mcg by mouth daily.  LIPITOR  40 MG tablet  Generic drug:  atorvastatin  Take 40 mg by mouth daily.     lisinopril 10 MG tablet  Commonly known as:  PRINIVIL,ZESTRIL  Take 1 tablet (10 mg total) by mouth daily.     loperamide 2 MG capsule  Commonly known as:  IMODIUM  Take 2-4 mg by mouth as needed for diarrhea or loose stools.     magnesium hydroxide 400 MG/5ML suspension  Commonly known as:  MILK OF MAGNESIA  Take 30 mLs by mouth every 4 (four) hours as needed for mild constipation.     metFORMIN 500 MG tablet  Commonly known as:  GLUCOPHAGE  Take 500 mg by mouth daily with breakfast.     multivitamin with minerals Tabs tablet  Take 1 tablet by mouth daily.     omeprazole 40 MG capsule  Commonly known as:  PRILOSEC  Take 80 mg by mouth daily.     ondansetron 4 MG tablet  Commonly known as:  ZOFRAN  Take 4 mg by mouth every 6 (six) hours as needed for nausea or vomiting.     oxyCODONE 5 MG immediate release tablet  Commonly known as:  Oxy IR/ROXICODONE  Take 5 mg by mouth every 4 (four) hours as needed for severe pain.     polyethylene glycol packet  Commonly known as:  MIRALAX / GLYCOLAX  Take 17 g by mouth daily.     tiotropium 18 MCG inhalation capsule  Commonly known as:  SPIRIVA  Place 18 mcg into inhaler and inhale daily.        Relevant Imaging Results:  Relevant Lab Results:  Recent Labs    Additional Information    Shela Leff, LCSW

## 2015-10-11 NOTE — Consult Note (Addendum)
ANTICOAGULATION CONSULT NOTE - Follow Up Consult  Pharmacy Consult for warfarin Indication: DVT/PE  Allergies  Allergen Reactions  . Penicillins Shortness Of Breath, Rash and Other (See Comments)    Has patient had a PCN reaction causing immediate rash, facial/tongue/throat swelling, SOB or lightheadedness with hypotension: Yes Has patient had a PCN reaction causing severe rash involving mucus membranes or skin necrosis: No Has patient had a PCN reaction that required hospitalization No Has patient had a PCN reaction occurring within the last 10 years: No If all of the above answers are "NO", then may proceed with Cephalosporin use.  . Sulfa Antibiotics Shortness Of Breath and Rash  . Aspirin Other (See Comments)    Reaction:  Unknown     Patient Measurements: Height: 5\' 9"  (175.3 cm) Weight: 181 lb (82.101 kg) IBW/kg (Calculated) : 66.2 Heparin Dosing Weight:   Vital Signs: Temp: 98.7 F (37.1 C) (11/07 0439) Temp Source: Oral (11/07 0439) BP: 140/59 mmHg (11/07 0439) Pulse Rate: 67 (11/07 0439)  Labs:  Recent Labs  10/08/15 1605 10/09/15 0454 10/09/15 1704 10/10/15 0353 10/11/15 0517  HGB 12.4  --   --  10.9*  --   HCT 38.0  --   --  33.2*  --   PLT 180  --   --  166  --   LABPROT  --   --  15.3* 15.7* 15.1*  INR  --   --  1.19 1.23 1.17  CREATININE 0.90 0.71  --   --   --   TROPONINI <0.03  --   --   --   --     Estimated Creatinine Clearance: 56.8 mL/min (by C-G formula based on Cr of 0.71).   Medical History: Past Medical History  Diagnosis Date  . History of kidney problems   . Diabetes ([place])   . Asthma   . Osteoarthritis   . Bilateral swelling of feet     and legs  . Neuropathy (HCC)     Medications:  Scheduled:  . atorvastatin  40 mg Oral Daily  . carbamazepine  200 mg Oral BID  . ciprofloxacin  400 mg Intravenous Q12H  . cloNIDine  0.1 mg Oral QHS  . enoxaparin (LOVENOX) injection  1 mg/kg Subcutaneous Q12H  . feeding supplement  (GLUCERNA SHAKE)  237 mL Oral TID WC  . insulin aspart  0-5 Units Subcutaneous QHS  . insulin aspart  0-9 Units Subcutaneous TID WC  . levothyroxine  150 mcg Oral QAC breakfast  . lisinopril  10 mg Oral Daily  . pantoprazole (PROTONIX) IV  40 mg Intravenous Q12H  . polyethylene glycol  17 g Oral Daily  . sodium chloride  3 mL Intravenous Q12H  . tiotropium  18 mcg Inhalation Daily  . warfarin  3 mg Oral q1800  . Warfarin - Pharmacist Dosing Inpatient   Does not apply q1800    Assessment: Pt is an 81 year old female with a DVT and PE. Due to interaction with Carbamazepine cannot use NOACs. Pharmacy consulted to dose warfarin. Pt currently on lovenox 1mg /kg q 12 hours.   INR History: 11/5 - 1.19, wafarin 2.5 mg 11/6 - 1.23, warfarin 2.5 mg 11/7 - 1.17  Goal of Therapy:  INR 2-3 Monitor platelets by anticoagulation protocol: Yes   Plan:  INR trending down today.  Due to Carbamazepine drug interaction, may require higher warfarin dose.  Will icnrease dose by ~20% to 3 mg po daily.  Continue lovenox 1mg /kg for atleast  5 days and until INR is therapeutic x 2. Recheck INR in the AM. Adjust as needed.  Murrell Converse, PharmD Clinical Pharmacist 10/11/2015    Addendum:  Damaris Schooner with MD Bridgett Larsson on rounds.  MD would like patient to get one time dose of warfarin 5 mg once and recheck INR in AM.  Ordered entered.  Will follow up in AM.   Murrell Converse, PharmD Clinical Pharmacist 10/11/2015

## 2015-10-12 ENCOUNTER — Encounter: Payer: Self-pay | Admitting: Vascular Surgery

## 2015-10-12 LAB — GLUCOSE, CAPILLARY
Glucose-Capillary: 110 mg/dL — ABNORMAL HIGH (ref 65–99)
Glucose-Capillary: 99 mg/dL (ref 65–99)
Glucose-Capillary: 117 mg/dL — ABNORMAL HIGH (ref 65–99)

## 2015-10-12 LAB — PROTIME-INR
INR: 1.19
Prothrombin Time: 15.3 seconds — ABNORMAL HIGH (ref 11.4–15.0)

## 2015-10-12 LAB — CBC
HCT: 35.3 % (ref 35.0–47.0)
Hemoglobin: 11.5 g/dL — ABNORMAL LOW (ref 12.0–16.0)
MCH: 30.1 pg (ref 26.0–34.0)
MCHC: 32.6 g/dL (ref 32.0–36.0)
MCV: 92.4 fL (ref 80.0–100.0)
Platelets: 243 10*3/uL (ref 150–440)
RBC: 3.82 MIL/uL (ref 3.80–5.20)
RDW: 13.8 % (ref 11.5–14.5)
WBC: 5.2 10*3/uL (ref 3.6–11.0)

## 2015-10-12 LAB — CREATININE, SERUM
Creatinine, Ser: 0.7 mg/dL (ref 0.44–1.00)
GFR calc Af Amer: >60 mL/min (ref >60)
GFR calc non Af Amer: >60 mL/min (ref >60)

## 2015-10-12 MED ORDER — WARFARIN SODIUM 5 MG PO TABS
5.0000 mg | ORAL_TABLET | Freq: Once | ORAL | Status: AC
  Administered 2015-10-12: 5 mg via ORAL
  Filled 2015-10-12: qty 1

## 2015-10-12 MED ORDER — PANTOPRAZOLE SODIUM 40 MG PO TBEC
40.0000 mg | DELAYED_RELEASE_TABLET | Freq: Two times a day (BID) | ORAL | Status: DC
  Administered 2015-10-12 – 2015-10-13 (×3): 40 mg via ORAL
  Filled 2015-10-12 (×3): qty 1

## 2015-10-12 NOTE — Progress Notes (Signed)
[facility]  Daily Progress Note   Subjective: 1 Day Post-Op: Ultrasound guidance for vascular access to the left femoral vein, Catheter placement into the inferior vena cava, Inferior venacavogram and Placement of a Cook Celect IVC filter.  Patient without complaint this AM. Denies any pain.   Objective: Filed Vitals:   10/11/15 1145 10/11/15 1212 10/11/15 2051 10/12/15 0558  BP: 138/58 149/54 150/58 157/65  Pulse: 63 60 76 71  Temp:  98.2 F (36.8 C) 98 F (36.7 C) 98.1 F (36.7 C)  TempSrc:  Oral Oral Oral  Resp: 18 16 18 18   Height:      Weight:      SpO2: 95% 100% 94% 93%    Intake/Output Summary (Last 24 hours) at 10/12/15 0825 Last data filed at 10/12/15 0535  Gross per 24 hour  Intake    781 ml  Output      0 ml  Net    781 ml    Physical Exam: A&Ox3, NAD CV: RRR Pulmonary: CTA Bilaterally Abdomen: Soft, Nontender, Nondistended Left Groin: Dressing in place. No drainage. No swelling noted.   Laboratory: CBC    Component Value Date/Time   WBC 5.2 10/12/2015 0514   HGB 11.5* 10/12/2015 0514   HCT 35.3 10/12/2015 0514   PLT 243 10/12/2015 0514    BMET    Component Value Date/Time   NA 137 10/09/2015 0454   K 3.9 10/09/2015 0454   CL 106 10/09/2015 0454   CO2 26 10/09/2015 0454   GLUCOSE 127* 10/09/2015 0454   BUN 12 10/09/2015 0454   CREATININE 0.70 10/12/2015 0514   CALCIUM 8.0* 10/09/2015 0454   GFRNONAA >60 10/12/2015 0514   GFRAA >60 10/12/2015 0514    Assessment/Planning: 81 year old female s/p 1 Day Post-Op: Ultrasound guidance for vascular access to the left femoral vein, Catheter placement into the inferior vena cava, Inferior venacavogram and Placement of a Cook Celect IVC filter for DVT and PE - doing well. 1) Remove dressing in two days. 2) Patient to follow up in our office in 8 weeks.  Marcelle Overlie PA-C 10/12/2015 8:25 AM

## 2015-10-12 NOTE — Consult Note (Signed)
ANTICOAGULATION CONSULT NOTE - Follow Up Consult  Pharmacy Consult for warfarin Indication: DVT/PE  Allergies  Allergen Reactions  . Penicillins Shortness Of Breath, Rash and Other (See Comments)    Has patient had a PCN reaction causing immediate rash, facial/tongue/throat swelling, SOB or lightheadedness with hypotension: Yes Has patient had a PCN reaction causing severe rash involving mucus membranes or skin necrosis: No Has patient had a PCN reaction that required hospitalization No Has patient had a PCN reaction occurring within the last 10 years: No If all of the above answers are "NO", then may proceed with Cephalosporin use.  . Sulfa Antibiotics Shortness Of Breath and Rash  . Aspirin Other (See Comments)    Reaction:  Unknown     Patient Measurements: Height: 5\' 9"  (175.3 cm) Weight: 181 lb (82.101 kg) IBW/kg (Calculated) : 66.2 Heparin Dosing Weight:   Vital Signs: Temp: 98.1 F (36.7 C) (11/08 0558) Temp Source: Oral (11/08 0558) BP: 157/65 mmHg (11/08 0558) Pulse Rate: 71 (11/08 0558)  Labs:  Recent Labs  10/10/15 0353 10/11/15 0517 10/12/15 0514  HGB 10.9*  --  11.5*  HCT 33.2*  --  35.3  PLT 166  --  243  LABPROT 15.7* 15.1* 15.3*  INR 1.23 1.17 1.19  CREATININE  --   --  0.70    Estimated Creatinine Clearance: 56.8 mL/min (by C-G formula based on Cr of 0.7).   Medical History: Past Medical History  Diagnosis Date  . History of kidney problems   . Diabetes ([place])   . Asthma   . Osteoarthritis   . Bilateral swelling of feet     and legs  . Neuropathy (HCC)     Medications:  Scheduled:  . atorvastatin  40 mg Oral Daily  . carbamazepine  200 mg Oral BID  . Chlorhexidine Gluconate Cloth  6 each Topical Once  . ciprofloxacin  500 mg Oral BID  . cloNIDine  0.1 mg Oral QHS  . docusate sodium  100 mg Oral BID  . enoxaparin (LOVENOX) injection  1 mg/kg Subcutaneous Q12H  . insulin aspart  0-5 Units Subcutaneous QHS  . insulin aspart  0-9  Units Subcutaneous TID WC  . levothyroxine  150 mcg Oral QAC breakfast  . lisinopril  10 mg Oral Daily  . pantoprazole (PROTONIX) IV  40 mg Intravenous Q12H  . polyethylene glycol  17 g Oral Daily  . sodium chloride  3 mL Intravenous Q12H  . tiotropium  18 mcg Inhalation Daily  . warfarin  5 mg Oral ONCE-1800  . Warfarin - Pharmacist Dosing Inpatient   Does not apply q1800    Assessment: Pt is an 81 year old female with a DVT and PE. Due to interaction with Carbamazepine cannot use NOACs. Pharmacy consulted to dose warfarin. Pt currently on lovenox 1mg /kg q 12 hours.   INR History: 11/5 - 1.19, wafarin 2.5 mg 11/6 - 1.23, warfarin 2.5 mg 11/7 - 1.17, warfarin 5 mg 11/8 - 1.19  Goal of Therapy:  INR 2-3 Monitor platelets by anticoagulation protocol: Yes   Plan:  Due to Carbamazepine drug interaction, may require higher warfarin dose.  Will give 5 mg of warfarin  Today.  Continue lovenox 1mg /kg for atleast 5 days and until INR is therapeutic x 2. Recheck INR in the AM. Adjust as needed.  Murrell Converse, PharmD Clinical Pharmacist 10/12/2015

## 2015-10-12 NOTE — Clinical Social Work Note (Signed)
Clinical Social Work Assessment  Patient Details  Name: Jessica Erickson MRN: [id number] Date of Birth: 13-Jul-1926  Date of referral:  10/12/15               Reason for consult:  Facility Placement                Permission sought to share information with:  Family Supports, Chartered certified accountant granted to share information::  Yes, Verbal Permission Granted  Name::        Agency::     Relationship::     Contact Information:     Housing/Transportation Living arrangements for the past 2 months:  [place] of Information:  Patient, Adult Children Patient Interpreter Needed:  None Criminal Activity/Legal Involvement Pertinent to Current Situation/Hospitalization:  No - Comment as needed Significant Relationships:  Adult Children Lives with:  Facility Resident Do you feel safe going back to the place where you live?  Yes Need for family participation in patient care:  Yes (Comment)  Care giving concerns:  Patient is a resident for short term rehab at [facility].    Social Worker assessment / plan:  CSW informed by physician that patient may discharge tomorrow back to [place]. CSW had already touched base with Joelene Millin at [facility] to have her begin reauth with [employer]. [employer] requires a physical therapy assessment for reauth and this was obtained and completed this afternoon. CSW has discussed with Surveyor, quantity of [place] in regards to considering an LOG while waiting on reauth if patient does not have reauth by time of discharge. Surveyor, quantity of CSW is willing to provide a 5 day letter of guarrantee if needed. CSW has spoken to the patient this afternoon and she is in agreement to return to [facility] when time. Patient's son, [name]: [phone], was also in the room with patient and he is in agreement as well and contacted his sister via phone to inform her. Patient's daughter: [name]: [phone], was given to me to speak with  via phone in patient's room and she is aware and in agreement with the above. Patient's son and patient believe that family can transport her via car tomorrow.  Employment status:  Retired Nurse, adult PT Recommendations:  [place] / Referral to community resources:  [place]  Patient/Family's Response to care:  Patient and son expressed appreciation for CSW assistance.  Patient/Family's Understanding of and Emotional Response to Diagnosis, Current Treatment, and Prognosis:  Patient and son and daughter were able to recap the plan for discharge tomorrow and that reauth had been started.  Emotional Assessment Appearance:  Appears stated age Attitude/Demeanor/Rapport:   (pleasant and cooperative) Affect (typically observed):  Accepting, Appropriate Orientation:  Oriented to Self, Oriented to Place, Oriented to  Time, Oriented to Situation Alcohol / Substance use:  Not Applicable Psych involvement (Current and /or in the community):  No (Comment)  Discharge Needs  Concerns to be addressed:  Care Coordination Readmission within the last 30 days:  No Current discharge risk:  None Barriers to Discharge:  No Barriers Identified   Shela Leff, LCSW 10/12/2015, 2:48 PM

## 2015-10-12 NOTE — Evaluation (Signed)
Physical Therapy Evaluation Patient Details Name: Jessica Erickson MRN: [id number] DOB: [birth year] Today's Date: 10/12/2015   History of Present Illness  Patient is a pleasant 81 y/o female that recently fell and sustained a spinal fx. After she had a surgery she went to [facility] for rehab. She is noted to  have been very active and lived independently prior to her fall. Patient noted to have shortness of breath and leg pain, found to have bilateral PEs and R DVT, IVC placed on 10/11/2015. Patient noted to have UTI, DM2, COPD on this admission.   Clinical Impression  Patient demonstrates good bed mobility, sitting balance, and transfers at this time. Patient continues to be very deconditioned from recent spinal fx and subsequent bilateral PEs and DVT. She is able to ambulate a short distance (25') with RW prior to fatiguing significantly and requiring she sit down. Patient at her baseline before falling was independently ambulating up steps and in the community with family. No loss of balance noted during this session, however patient has not been ambulatory due to medical concerns in roughly 1 week, leading to significant regression in ambulatory tolerance. Prior to developing PEs, she was apparently ambulating household distances with RW. Given her significant decline from her baseline and recent stay in rehab, PT recommends patient return to rehab to increase her independence with mobility.     Follow Up Recommendations SNF    Equipment Recommendations  Rolling walker with 5" wheels    Recommendations for Other Services       Precautions / Restrictions Precautions Precautions: Fall Precaution Comments: Spinal precautions (really just lifting) Restrictions Weight Bearing Restrictions: No      Mobility  Bed Mobility Overal bed mobility: Needs Assistance Bed Mobility: Supine to Sit     Supine to sit: Min guard;Min assist     General bed mobility comments: Patient is able to use  bedrails to complete transfer, minimal assistance to bring to upright sitting.   Transfers Overall transfer level: Needs assistance Equipment used: Rolling walker (2 wheeled) Transfers: Sit to/from Stand Sit to Stand: Min guard         General transfer comment: Patient noted to have flexed trunk throughout transfer, prolonged time required.   Ambulation/Gait Ambulation/Gait assistance: Min guard Ambulation Distance (Feet): 20 Feet Assistive device: Rolling walker (2 wheeled) Gait Pattern/deviations: WFL(Within Functional Limits) Gait velocity: decreased Gait velocity interpretation: Below normal speed for age/gender General Gait Details: Patient noted to have flexed trunk, decreased gait speed, and decreased step lengths.   Stairs            Wheelchair Mobility    Modified Rankin (Stroke Patients Only)       Balance Overall balance assessment: Needs assistance   Sitting balance-Leahy Scale: Good Sitting balance - Comments: Posterior lean noted at times, no loss of balance noted.      Standing balance-Leahy Scale: Fair Standing balance comment: Not formally assessed, no loss of balance noted with RW.                              Pertinent Vitals/Pain Pain Assessment:  (Patient denies pain.)    Home Living Family/patient expects to be discharged to:: Skilled nursing facility Living Arrangements: Alone   Type of Home: Apartment Home Access: Level entry     Home Layout: One level Home Equipment: Cane - single point      Prior Function Level of Independence: Needs assistance  Comments: Patient has been ambulating ~ 200' at [facility] prior to this episode with RW.      Hand Dominance        Extremity/Trunk Assessment   Upper Extremity Assessment: Overall WFL for tasks assessed           Lower Extremity Assessment: Overall WFL for tasks assessed (Gross LE strength 4 to 4+/5 bilaterally)         Communication    Communication: HOH  Cognition Arousal/Alertness: Awake/alert Behavior During Therapy: WFL for tasks assessed/performed Overall Cognitive Status: Within Functional Limits for tasks assessed                      General Comments      Exercises General Exercises - Lower Extremity Heel Slides: AROM;Both;10 reps Hip ABduction/ADduction: AROM;Both;10 reps Straight Leg Raises: AROM;Both;10 reps      Assessment/Plan    PT Assessment Patient needs continued PT services  PT Diagnosis Difficulty walking;Generalized weakness   PT Problem List Decreased strength;Decreased activity tolerance;Decreased mobility;Pain  PT Treatment Interventions DME instruction;Gait training;Functional mobility training;Therapeutic activities;Therapeutic exercise   PT Goals (Current goals can be found in the Care Plan section) Acute Rehab PT Goals Patient Stated Goal: To become more independent with mobility  PT Goal Formulation: With patient Time For Goal Achievement: 10/26/15 Potential to Achieve Goals: Good    Frequency Min 2X/week   Barriers to discharge Decreased caregiver support Patient lives alone    Co-evaluation               End of Session Equipment Utilized During Treatment: Gait belt Activity Tolerance: Patient tolerated treatment well;Patient limited by fatigue Patient left: in chair;with chair alarm set;with call bell/phone within reach;with family/visitor present Nurse Communication: Mobility status         Time: [id number] PT Time Calculation (min) (ACUTE ONLY): 17 min   Charges:   PT Evaluation $Initial PT Evaluation Tier I: 1 Procedure     PT G Codes:       Kerman Passey, PT, DPT    10/12/2015, 3:57 PM

## 2015-10-12 NOTE — Progress Notes (Signed)
[facility] [place] NAME: Diandria Mas    MR#:  [id number]  DATE OF BIRTH:  04-09-1926  SUBJECTIVE:  CHIEF COMPLAINT:   Chief Complaint  Patient presents with  . Shortness of Breath   a little hemoptysis this morning,  no shortness of breath.  REVIEW OF SYSTEMS:  CONSTITUTIONAL: No fever, has generalized weakness.  EYES: No blurred or double vision.  EARS, NOSE, AND THROAT: No tinnitus or ear pain.  RESPIRATORY: [place], no shortness of breath or wheezing, no hemoptysis.  CARDIOVASCULAR: No chest pain, orthopnea, edema.  GASTROINTESTINAL: No nausea, vomiting, diarrhea or abdominal pain.  GENITOURINARY: No dysuria, hematuria.  ENDOCRINE: No polyuria, nocturia,  HEMATOLOGY: No anemia, easy bruising or bleeding SKIN: No rash or lesion. MUSCULOSKELETAL: No joint pain or arthritis.   NEUROLOGIC: No tingling, numbness, weakness.  PSYCHIATRY: No anxiety or depression.   DRUG ALLERGIES:   Allergies  Allergen Reactions  . Penicillins Shortness Of Breath, Rash and Other (See Comments)    Has patient had a PCN reaction causing immediate rash, facial/tongue/throat swelling, SOB or lightheadedness with hypotension: Yes Has patient had a PCN reaction causing severe rash involving mucus membranes or skin necrosis: No Has patient had a PCN reaction that required hospitalization No Has patient had a PCN reaction occurring within the last 10 years: No If all of the above answers are "NO", then may proceed with Cephalosporin use.  . Sulfa Antibiotics Shortness Of Breath and Rash  . Aspirin Other (See Comments)    Reaction:  Unknown     VITALS:  Blood pressure 147/65, pulse 95, temperature 98.8 F (37.1 C), temperature source Oral, resp. rate 17, height 5\' 9"  (1.753 m), weight 82.101 kg (181 lb), SpO2 96 %.  PHYSICAL EXAMINATION:  GENERAL:  81 y.o.-year-old patient lying in the bed with no acute distress.  EYES: Pupils equal, round,  reactive to light and accommodation. No scleral icterus. Extraocular muscles intact.  HEENT: Head atraumatic, normocephalic. Oropharynx and nasopharynx clear. Dry oral mucosa. NECK:  Supple, no jugular venous distention. No thyroid enlargement, no tenderness.  LUNGS: Normal breath sounds bilaterally, no wheezing, rales,rhonchi or crepitation. No use of accessory muscles of respiration.  CARDIOVASCULAR: S1, S2 normal. Systolic murmurs 1/6, rubs, or gallops.  ABDOMEN: Soft, nontender, nondistended. Bowel sounds present. No organomegaly or mass.  EXTREMITIES: trace leg edema, no cyanosis, or clubbing. But has right-sided calf sore. NEUROLOGIC: Cranial nerves II through XII are intact. Muscle strength 4/5 in all extremities. Sensation intact. Gait not checked.  PSYCHIATRIC: The patient is alert and oriented x 3.  SKIN: No obvious rash, lesion, or ulcer.    LABORATORY PANEL:   CBC  Recent Labs Lab 10/12/15 0514  WBC 5.2  HGB 11.5*  HCT 35.3  PLT 243   ------------------------------------------------------------------------------------------------------------------  Chemistries   Recent Labs Lab 10/09/15 0454 10/12/15 0514  NA 137  --   K 3.9  --   CL 106  --   CO2 26  --   GLUCOSE 127*  --   BUN 12  --   CREATININE 0.71 0.70  CALCIUM 8.0*  --   AST 13*  --   ALT 11*  --   ALKPHOS 83  --   BILITOT 0.4  --    ------------------------------------------------------------------------------------------------------------------  Cardiac Enzymes  Recent Labs Lab 10/08/15 1605  TROPONINI <0.03   ------------------------------------------------------------------------------------------------------------------  RADIOLOGY:  No results found.  EKG:   Orders placed or performed during the hospital encounter  of 10/08/15  . ED EKG  . ED EKG  . EKG 12-Lead  . EKG 12-Lead  . EKG 12-Lead  . EKG 12-Lead    ASSESSMENT AND PLAN:   1. Bilateral PE and Right femoral and  popliteal DVT.   Status post IVC filter placement by Dr. Lucky Cowboy. Mild pulmonary hypertension and ejection fraction 55% per echocardiogram.  Since there are interaction between oral anticoagulation medication and Tegretol, I discussed with the patient's daughter and son, they agreed to start Coumadin.  Continue Coumadin pharmacy to dose and on Lovenox 1 mg/kg every 12 hours. Follow-up hemoglobin and INR.     * Acute respiratory distress with hypoxia. Improved, off oxygen by nasal cannula.  2. UTI, continue Cipro, follow-up urine cultures: Escherichia coli and Proteus, both sensitive to Cipro. Continue Cipro.  Leukocytosis. Hospital due to reaction or UTI. Discontinued vancomycin and Azactam. Improved.  3. Back pain, continue outpatient medications 4. History of COPD per patient and her daughter. Stable. Continue Spiriva. 5. DM2, continue sliding scale.   All the records are reviewed and case discussed with Care Management/Social Workerr. Management plans discussed with the patient, her son and they are in agreement. Greater than 50% time was spent on coordination of care and face-to-face counseling.  CODE STATUS: Full code  TOTAL TIME TAKING CARE OF THIS PATIENT: 35 minutes.   Tolerates PT today. PT suggest skilled nursing facility.  POSSIBLE D/C to [facility] IN tomorrow, DEPENDING ON CLINICAL CONDITION.   Demetrios Loll M.D on 10/12/2015 at 4:47 PM  Between 7am to 6pm - Pager - [phone]  After 6pm go to [email or web address] - password [email or web address] [facility]  Office  [phone]  CC: Primary care physician; Volanda Napoleon, MD

## 2015-10-12 NOTE — Care Management Important Message (Signed)
Important Message  Patient Details  Name: Jessica Erickson MRN: [id number] Date of Birth: [birth year]   Medicare Important Message Given:  Yes-second notification given    Alvie Heidelberg, RN 10/12/2015, 11:46 AM

## 2015-10-12 NOTE — Progress Notes (Signed)
   CONCERNING: IV to Oral Route Change Policy  RECOMMENDATION: This patient is receiving pantoprazole by the intravenous route.  Based on criteria approved by the Pharmacy and Therapeutics Committee, the intravenous medication(s) is/are being converted to the equivalent oral dose form(s).   DESCRIPTION: These criteria include:  The patient is eating (either orally or via tube) and/or has been taking other orally administered medications for a least 24 hours  The patient has no evidence of active gastrointestinal bleeding or impaired GI absorption (gastrectomy, short bowel, patient on TNA or NPO).  If you have questions about this conversion, please contact the Pharmacy Department  []   [phone] )  [facility] [x]   [phone] )  [facility] []   [phone] )  [facility] []   [phone] )  [facility] []   [phone] )  [place], [facility] 10/12/2015 9:42 AM

## 2015-10-13 LAB — GLUCOSE, CAPILLARY
Glucose-Capillary: 115 mg/dL — ABNORMAL HIGH (ref 65–99)
Glucose-Capillary: 112 mg/dL — ABNORMAL HIGH (ref 65–99)
Glucose-Capillary: 125 mg/dL — ABNORMAL HIGH (ref 65–99)
Glucose-Capillary: 92 mg/dL (ref 65–99)
Glucose-Capillary: 132 mg/dL — ABNORMAL HIGH (ref 65–99)

## 2015-10-13 LAB — PROTIME-INR
INR: 1.25
Prothrombin Time: 15.9 seconds — ABNORMAL HIGH (ref 11.4–15.0)

## 2015-10-13 MED ORDER — ENOXAPARIN SODIUM 150 MG/ML ~~LOC~~ SOLN: 1.0000 mg/kg | Freq: Two times a day (BID) | SUBCUTANEOUS | Status: DC

## 2015-10-13 MED ORDER — WARFARIN SODIUM 5 MG PO TABS: 6.0000 mg | ORAL_TABLET | Freq: Every day | ORAL | Status: DC

## 2015-10-13 MED ORDER — WARFARIN SODIUM 6 MG PO TABS: 6.0000 mg | ORAL_TABLET | Freq: Every day | ORAL | Status: DC

## 2015-10-13 MED ORDER — OXYCODONE HCL 5 MG PO TABS: 5.0000 mg | ORAL_TABLET | ORAL | Status: DC | PRN

## 2015-10-13 NOTE — Consult Note (Signed)
ANTICOAGULATION CONSULT NOTE - Follow Up Consult  Pharmacy Consult for warfarin Indication: DVT/PE  Allergies  Allergen Reactions  . Penicillins Shortness Of Breath, Rash and Other (See Comments)    Has patient had a PCN reaction causing immediate rash, facial/tongue/throat swelling, SOB or lightheadedness with hypotension: Yes Has patient had a PCN reaction causing severe rash involving mucus membranes or skin necrosis: No Has patient had a PCN reaction that required hospitalization No Has patient had a PCN reaction occurring within the last 10 years: No If all of the above answers are "NO", then may proceed with Cephalosporin use.  . Sulfa Antibiotics Shortness Of Breath and Rash  . Aspirin Other (See Comments)    Reaction:  Unknown     Patient Measurements: Height: 5\' 9"  (175.3 cm) Weight: 181 lb (82.101 kg) IBW/kg (Calculated) : 66.2 Heparin Dosing Weight:   Vital Signs: Temp: 98.7 F (37.1 C) (11/09 0639) Temp Source: Oral (11/09 0639) BP: 142/65 mmHg (11/09 0639) Pulse Rate: 66 (11/09 0639)  Labs:  Recent Labs  10/11/15 0517 10/12/15 0514 10/13/15 0500  HGB  --  11.5*  --   HCT  --  35.3  --   PLT  --  243  --   LABPROT 15.1* 15.3* 15.9*  INR 1.17 1.19 1.25  CREATININE  --  0.70  --     Estimated Creatinine Clearance: 56.8 mL/min (by C-G formula based on Cr of 0.7).   Medical History: Past Medical History  Diagnosis Date  . History of kidney problems   . Diabetes ([place])   . Asthma   . Osteoarthritis   . Bilateral swelling of feet     and legs  . Neuropathy ([facility])     Medications:  Scheduled:  . atorvastatin  40 mg Oral Daily  . carbamazepine  200 mg Oral BID  . Chlorhexidine Gluconate Cloth  6 each Topical Once  . ciprofloxacin  500 mg Oral BID  . cloNIDine  0.1 mg Oral QHS  . docusate sodium  100 mg Oral BID  . enoxaparin (LOVENOX) injection  1 mg/kg Subcutaneous Q12H  . insulin aspart  0-5 Units Subcutaneous QHS  . insulin aspart  0-9 Units  Subcutaneous TID WC  . levothyroxine  150 mcg Oral QAC breakfast  . lisinopril  10 mg Oral Daily  . pantoprazole  40 mg Oral BID AC  . polyethylene glycol  17 g Oral Daily  . sodium chloride  3 mL Intravenous Q12H  . tiotropium  18 mcg Inhalation Daily  . warfarin  6 mg Oral q1800  . Warfarin - Pharmacist Dosing Inpatient   Does not apply q1800    Assessment: Pt is an 81 year old female with a DVT and PE. Due to interaction with Carbamazepine cannot use NOACs. Pharmacy consulted to dose warfarin. Pt currently on lovenox 1mg /kg q 12 hours.   INR History: 11/5 - 1.19, wafarin 2.5 mg 11/6 - 1.23, warfarin 2.5 mg 11/7 - 1.17, warfarin 5 mg 11/8 - 1.19, warfarin 5 mg 11/9 - 1.25  Goal of Therapy:  INR 2-3 Monitor platelets by anticoagulation protocol: Yes   Plan:  Due to Carbamazepine drug interaction, may require higher warfarin dose.  Will give 6 mg of warfarin today (~20% increase in total weekly dose).  Continue lovenox 1mg /kg for atleast 5 days and until INR is therapeutic x 2. Recheck INR in the AM. Adjust as needed.  Murrell Converse, PharmD Clinical Pharmacist 10/13/2015

## 2015-10-13 NOTE — Discharge Instructions (Signed)
Heart healthy  And ADA diet. Activity as tolerated. Follow up INR and adjust coumadin dose. Discontinue lovenox when INR is therapeutic (2-3).

## 2015-10-13 NOTE — Clinical Social Work Note (Signed)
Patient to discharge today to return to [facility] today. Kim at [facility] informed that they received reauth from [facility]. Patient's son to transport.  Shela Leff MSW,LCSW [phone]

## 2015-10-13 NOTE — Progress Notes (Signed)
Pt received discharge orders to facility; Report was called by primary RN and instructions given to [place] at [place]. Pt was discharged via wheelchair.

## 2015-10-13 NOTE — Progress Notes (Signed)
Pt complained about spitting up to what seemed to be more blood tinged sputum; Not a large amount per RN.  Dr. Bridgett Larsson paged and spoken to and made aware. Orders received to continue to discharge.

## 2015-10-13 NOTE — Discharge Summary (Signed)
[facility] [place] NAME: Jessica Erickson    MR#:  [id number]  DATE OF BIRTH:  Sep 22, 1926  DATE OF ADMISSION:  10/08/2015 ADMITTING PHYSICIAN: Theodoro Grist, MD  DATE OF DISCHARGE: 10/13/2015 PRIMARY CARE PHYSICIAN: Volanda Napoleon, MD    ADMISSION DIAGNOSIS:  UTI (lower urinary tract infection) [N39.0] Hemoptysis [R04.2] Sepsis, due to unspecified organism ([place]) [A41.9]   DISCHARGE DIAGNOSIS:  Bilateral PE and Right femoral and popliteal DVT.  Acute respiratory distress with hypoxia UTI  SECONDARY DIAGNOSIS:   Past Medical History  Diagnosis Date  . History of kidney problems   . Diabetes ([place])   . Asthma   . Osteoarthritis   . Bilateral swelling of feet     and legs  . Neuropathy ([place])     HOSPITAL COURSE:    1.  Bilateral PE and Right femoral and popliteal DVT. The patient has been treated with  Coumadin pharmacy to dose and on Lovenox 1 mg/kg every 12 hours. INR is sub-therapeutic. Mild pulmonary hypertension and ejection fraction 55% per echocardiogram.  Status post IVC filter placement by Dr. Lucky Cowboy. Continue lovenox and coumadin after discharge and follow-up hemoglobin and INR.    * Acute respiratory distress with hypoxia. Improved, off oxygen by nasal cannula.  2. UTI, treated with Cipro 5 days, urine cultures: Escherichia coli and Proteus, both sensitive to cipro. She is asymptomatic. Discontinue cipro after discharge.  Leukocytosis. Hospital due to reaction or UTI. Discontinued vancomycin and Azactam. Improved.  3. Back pain, continue outpatient medications 4. History of COPD per patient and her daughter. Stable. Continue Spiriva. 5. DM2, treated with sliding scale. Resume metformin after discharge.  DISCHARGE CONDITIONS:   Stable, discharge to SNF today.  CONSULTS OBTAINED:  Treatment Team:  Algernon Huxley, MD  DRUG ALLERGIES:   Allergies  Allergen Reactions  . Penicillins Shortness Of Breath,  Rash and Other (See Comments)    Has patient had a PCN reaction causing immediate rash, facial/tongue/throat swelling, SOB or lightheadedness with hypotension: Yes Has patient had a PCN reaction causing severe rash involving mucus membranes or skin necrosis: No Has patient had a PCN reaction that required hospitalization No Has patient had a PCN reaction occurring within the last 10 years: No If all of the above answers are "NO", then may proceed with Cephalosporin use.  . Sulfa Antibiotics Shortness Of Breath and Rash  . Aspirin Other (See Comments)    Reaction:  Unknown     DISCHARGE MEDICATIONS:   Current Discharge Medication List    START taking these medications   Details  enoxaparin (LOVENOX) 150 MG/ML injection Inject 0.55 mLs (85 mg total) into the skin every 12 (twelve) hours. Qty: 22.4 Syringe, Refills: 0    warfarin (COUMADIN) 6 MG tablet Take 1 tablet (6 mg total) by mouth daily. Qty: 7 tablet, Refills: 0      CONTINUE these medications which have CHANGED   Details  oxyCODONE (OXY IR/ROXICODONE) 5 MG immediate release tablet Take 1 tablet (5 mg total) by mouth every 4 (four) hours as needed for severe pain. Qty: 20 tablet, Refills: 0      CONTINUE these medications which have NOT CHANGED   Details  atorvastatin (LIPITOR) 40 MG tablet Take 40 mg by mouth daily.    Calcium Carbonate-Vitamin D (CALCIUM 600+D) 600-400 MG-UNIT tablet Take 1 tablet by mouth daily.    carbamazepine (TEGRETOL) 200 MG tablet Take 200 mg by mouth 2 (two) times daily.  cloNIDine (CATAPRES) 0.1 MG tablet Take 0.1 mg by mouth at bedtime.     diphenhydramine-acetaminophen (TYLENOL PM) 25-500 MG TABS tablet Take 1 tablet by mouth at bedtime as needed (for sleep).    fosinopril (MONOPRIL) 10 MG tablet Take 10 mg by mouth daily.    guaifenesin (ROBITUSSIN) 100 MG/5ML syrup Take 200 mg by mouth every 4 (four) hours as needed for cough.    levothyroxine (LEVOXYL) 150 MCG tablet Take 150 mcg  by mouth daily.     loperamide (IMODIUM) 2 MG capsule Take 2-4 mg by mouth as needed for diarrhea or loose stools.    magnesium hydroxide (MILK OF MAGNESIA) 400 MG/5ML suspension Take 30 mLs by mouth every 4 (four) hours as needed for mild constipation.    metFORMIN (GLUCOPHAGE) 500 MG tablet Take 500 mg by mouth daily with breakfast.     Multiple Vitamin (MULTIVITAMIN WITH MINERALS) TABS tablet Take 1 tablet by mouth daily.    omeprazole (PRILOSEC) 40 MG capsule Take 80 mg by mouth daily.    ondansetron (ZOFRAN) 4 MG tablet Take 4 mg by mouth every 6 (six) hours as needed for nausea or vomiting.    polyethylene glycol (MIRALAX / GLYCOLAX) packet Take 17 g by mouth daily.    tiotropium (SPIRIVA) 18 MCG inhalation capsule Place 18 mcg into inhaler and inhale daily.    acetaminophen (TYLENOL) 325 MG tablet Take 2 tablets (650 mg total) by mouth every 6 (six) hours as needed. Qty: 60 tablet, Refills: 0    lisinopril (PRINIVIL,ZESTRIL) 10 MG tablet Take 1 tablet (10 mg total) by mouth daily.         DISCHARGE INSTRUCTIONS:    If you experience worsening of your admission symptoms, develop shortness of breath, life threatening emergency, suicidal or homicidal thoughts you must seek medical attention immediately by calling 911 or calling your MD immediately  if symptoms less severe.  You Must read complete instructions/literature along with all the possible adverse reactions/side effects for all the Medicines you take and that have been prescribed to you. Take any new Medicines after you have completely understood and accept all the possible adverse reactions/side effects.   Please note  You were cared for by a hospitalist during your hospital stay. If you have any questions about your discharge medications or the care you received while you were in the hospital after you are discharged, you can call the unit and asked to speak with the hospitalist on call if the hospitalist that took  care of you is not available. Once you are discharged, your primary care physician will handle any further medical issues. Please note that NO REFILLS for any discharge medications will be authorized once you are discharged, as it is imperative that you return to your primary care physician (or establish a relationship with a primary care physician if you do not have one) for your aftercare needs so that they can reassess your need for medications and monitor your lab values.    Today   SUBJECTIVE   No complaint.   VITAL SIGNS:  Blood pressure 142/65, pulse 66, temperature 98.7 F (37.1 C), temperature source Oral, resp. rate 18, height 5\' 9"  (1.753 m), weight 82.101 kg (181 lb), SpO2 93 %.  I/O:   Intake/Output Summary (Last 24 hours) at 10/13/15 0840 Last data filed at 10/13/15 0643  Gross per 24 hour  Intake    480 ml  Output      0 ml  Net  480 ml    PHYSICAL EXAMINATION:  GENERAL:  81 y.o.-year-old patient lying in the bed with no acute distress.  EYES: Pupils equal, round, reactive to light and accommodation. No scleral icterus. Extraocular muscles intact.  HEENT: Head atraumatic, normocephalic. Oropharynx and nasopharynx clear.  NECK:  Supple, no jugular venous distention. No thyroid enlargement, no tenderness.  LUNGS: Normal breath sounds bilaterally, no wheezing, rales,rhonchi or crepitation. No use of accessory muscles of respiration.  CARDIOVASCULAR: S1, S2 normal. No murmurs, rubs, or gallops.  ABDOMEN: Soft, non-tender, non-distended. Bowel sounds present. No organomegaly or mass.  EXTREMITIES: No pedal edema, cyanosis, or clubbing.  NEUROLOGIC: Cranial nerves II through XII are intact. Muscle strength 4/5 in all extremities. Sensation intact. Gait not checked.  PSYCHIATRIC: The patient is alert and oriented x 3.  SKIN: No obvious rash, lesion, or ulcer.   DATA REVIEW:   CBC  Recent Labs Lab 10/12/15 0514  WBC 5.2  HGB 11.5*  HCT 35.3  PLT 243     Chemistries   Recent Labs Lab 10/09/15 0454 10/12/15 0514  NA 137  --   K 3.9  --   CL 106  --   CO2 26  --   GLUCOSE 127*  --   BUN 12  --   CREATININE 0.71 0.70  CALCIUM 8.0*  --   AST 13*  --   ALT 11*  --   ALKPHOS 83  --   BILITOT 0.4  --     Cardiac Enzymes  Recent Labs Lab 10/08/15 1605  TROPONINI <0.03    Microbiology Results  Results for orders placed or performed during the hospital encounter of 10/08/15  Blood culture (routine x 2)     Status: None (Preliminary result)   Collection Time: 10/08/15  4:40 PM  Result Value Ref Range Status   Specimen Description BLOOD LEFT AC  Final   Special Requests BOTTLES DRAWN AEROBIC AND ANAEROBIC AER10CC ANA5CC  Final   Culture NO GROWTH 4 DAYS  Final   Report Status PENDING  Incomplete  Blood culture (routine x 2)     Status: None (Preliminary result)   Collection Time: 10/08/15  4:49 PM  Result Value Ref Range Status   Specimen Description BLOOD RIGHT HAND  Final   Special Requests BOTTLES DRAWN AEROBIC AND ANAEROBIC 2CC  Final   Culture NO GROWTH 4 DAYS  Final   Report Status PENDING  Incomplete  Urine culture     Status: None   Collection Time: 10/08/15  4:49 PM  Result Value Ref Range Status   Specimen Description URINE, CLEAN CATCH  Final   Special Requests NONE  Final   Culture   Final    >=100,000 COLONIES/mL PROTEUS MIRABILIS 10,000 COLONIES/mL ESCHERICHIA COLI    Report Status 10/11/2015 FINAL  Final   Organism ID, Bacteria PROTEUS MIRABILIS  Final   Organism ID, Bacteria ESCHERICHIA COLI  Final      Susceptibility   Escherichia coli - MIC*    AMPICILLIN 8 SENSITIVE Sensitive     CEFAZOLIN <=4 SENSITIVE Sensitive     CEFTRIAXONE <=1 SENSITIVE Sensitive     CIPROFLOXACIN <=0.25 SENSITIVE Sensitive     GENTAMICIN <=1 SENSITIVE Sensitive     IMIPENEM <=0.25 SENSITIVE Sensitive     NITROFURANTOIN <=16 SENSITIVE Sensitive     TRIMETH/SULFA <=20 SENSITIVE Sensitive     PIP/TAZO Value in next  row Sensitive      SENSITIVE<=4    ERTAPENEM Value in next row  Sensitive      SENSITIVE<=0.5    LEVOFLOXACIN Value in next row Sensitive      SENSITIVE<=0.12    * 10,000 COLONIES/mL ESCHERICHIA COLI   Proteus mirabilis - MIC*    AMPICILLIN Value in next row Sensitive      SENSITIVE<=0.12    CEFAZOLIN Value in next row Sensitive      SENSITIVE<=0.12    CEFTRIAXONE Value in next row Sensitive      SENSITIVE<=0.12    CIPROFLOXACIN Value in next row Sensitive      SENSITIVE<=0.12    GENTAMICIN Value in next row Sensitive      SENSITIVE<=0.12    IMIPENEM Value in next row Sensitive      SENSITIVE<=0.12    NITROFURANTOIN Value in next row Resistant      SENSITIVE<=0.12    TRIMETH/SULFA Value in next row Sensitive      SENSITIVE<=0.12    PIP/TAZO Value in next row Sensitive      SENSITIVE<=4    ERTAPENEM Value in next row Sensitive      SENSITIVE<=0.5    LEVOFLOXACIN Value in next row Sensitive      SENSITIVE<=0.12    * >=100,000 COLONIES/mL PROTEUS MIRABILIS    RADIOLOGY:  No results found.      Management plans discussed with the patient, family and they are in agreement.  CODE STATUS:     Code Status Orders        Start     Ordered   10/08/15 2113  Full code   Continuous     10/08/15 2112    Advance Directive Documentation        Most Recent Value   Type of Advance Directive  Living will   Pre-existing out of facility DNR order (yellow form or pink MOST form)     "MOST" Form in Place?        TOTAL TIME TAKING CARE OF THIS PATIENT: 37 minutes.    Demetrios Loll M.D on 10/13/2015 at 8:40 AM  Between 7am to 6pm - Pager - [phone]  After 6pm go to [email or web address] - password [email or web address] [facility]  Office  [phone]  CC: Primary care physician; Volanda Napoleon, MD

## 2015-10-14 DIAGNOSIS — D649 Anemia, unspecified: Secondary | ICD-10-CM | POA: Diagnosis present

## 2015-10-14 DIAGNOSIS — I82409 Acute embolism and thrombosis of unspecified deep veins of unspecified lower extremity: Secondary | ICD-10-CM | POA: Diagnosis not present

## 2015-10-14 DIAGNOSIS — Z794 Long term (current) use of insulin: Secondary | ICD-10-CM | POA: Diagnosis not present

## 2015-10-14 DIAGNOSIS — E119 Type 2 diabetes mellitus without complications: Secondary | ICD-10-CM | POA: Diagnosis not present

## 2015-10-14 LAB — GLUCOSE, CAPILLARY
Glucose-Capillary: 94 mg/dL (ref 65–99)
Glucose-Capillary: 113 mg/dL — ABNORMAL HIGH (ref 65–99)
Glucose-Capillary: 105 mg/dL — ABNORMAL HIGH (ref 65–99)

## 2015-10-14 LAB — PROTIME-INR
INR: 1.45
Prothrombin Time: 17.7 seconds — ABNORMAL HIGH (ref 11.4–15.0)

## 2015-10-15 DIAGNOSIS — I82409 Acute embolism and thrombosis of unspecified deep veins of unspecified lower extremity: Secondary | ICD-10-CM | POA: Diagnosis not present

## 2015-10-15 LAB — GLUCOSE, CAPILLARY
Glucose-Capillary: 100 mg/dL — ABNORMAL HIGH (ref 65–99)
Glucose-Capillary: 103 mg/dL — ABNORMAL HIGH (ref 65–99)
Glucose-Capillary: 105 mg/dL — ABNORMAL HIGH (ref 65–99)
Glucose-Capillary: 122 mg/dL — ABNORMAL HIGH (ref 65–99)

## 2015-10-16 DIAGNOSIS — I82409 Acute embolism and thrombosis of unspecified deep veins of unspecified lower extremity: Secondary | ICD-10-CM | POA: Diagnosis not present

## 2015-10-16 LAB — GLUCOSE, CAPILLARY
Glucose-Capillary: 93 mg/dL (ref 65–99)
Glucose-Capillary: 111 mg/dL — ABNORMAL HIGH (ref 65–99)
Glucose-Capillary: 87 mg/dL (ref 65–99)
Glucose-Capillary: 102 mg/dL — ABNORMAL HIGH (ref 65–99)

## 2015-10-17 DIAGNOSIS — I82409 Acute embolism and thrombosis of unspecified deep veins of unspecified lower extremity: Secondary | ICD-10-CM | POA: Diagnosis not present

## 2015-10-17 LAB — GLUCOSE, CAPILLARY
Glucose-Capillary: 88 mg/dL (ref 65–99)
Glucose-Capillary: 125 mg/dL — ABNORMAL HIGH (ref 65–99)
Glucose-Capillary: 103 mg/dL — ABNORMAL HIGH (ref 65–99)
Glucose-Capillary: 114 mg/dL — ABNORMAL HIGH (ref 65–99)

## 2015-10-18 DIAGNOSIS — I82409 Acute embolism and thrombosis of unspecified deep veins of unspecified lower extremity: Secondary | ICD-10-CM | POA: Diagnosis not present

## 2015-10-18 LAB — CULTURE, BLOOD (ROUTINE X 2)
Culture: NO GROWTH
Culture: NO GROWTH

## 2015-10-18 LAB — GLUCOSE, CAPILLARY: Glucose-Capillary: 104 mg/dL — ABNORMAL HIGH (ref 65–99)

## 2015-10-19 ENCOUNTER — Ambulatory Visit: Payer: Medicare PPO | Admitting: Podiatry

## 2015-10-19 LAB — GLUCOSE, CAPILLARY
Glucose-Capillary: 121 mg/dL — ABNORMAL HIGH (ref 65–99)
Glucose-Capillary: 95 mg/dL (ref 65–99)
Glucose-Capillary: 100 mg/dL — ABNORMAL HIGH (ref 65–99)
Glucose-Capillary: 85 mg/dL (ref 65–99)

## 2015-10-20 DIAGNOSIS — I82409 Acute embolism and thrombosis of unspecified deep veins of unspecified lower extremity: Secondary | ICD-10-CM | POA: Diagnosis not present

## 2015-10-20 LAB — GLUCOSE, CAPILLARY
Glucose-Capillary: 95 mg/dL (ref 65–99)
Glucose-Capillary: 127 mg/dL — ABNORMAL HIGH (ref 65–99)
Glucose-Capillary: 89 mg/dL (ref 65–99)
Glucose-Capillary: 121 mg/dL — ABNORMAL HIGH (ref 65–99)

## 2015-10-21 DIAGNOSIS — I82409 Acute embolism and thrombosis of unspecified deep veins of unspecified lower extremity: Secondary | ICD-10-CM | POA: Diagnosis not present

## 2015-10-21 LAB — GLUCOSE, CAPILLARY
Glucose-Capillary: 100 mg/dL — ABNORMAL HIGH (ref 65–99)
Glucose-Capillary: 129 mg/dL — ABNORMAL HIGH (ref 65–99)
Glucose-Capillary: 133 mg/dL — ABNORMAL HIGH (ref 65–99)
Glucose-Capillary: 105 mg/dL — ABNORMAL HIGH (ref 65–99)

## 2015-10-22 DIAGNOSIS — I82409 Acute embolism and thrombosis of unspecified deep veins of unspecified lower extremity: Secondary | ICD-10-CM | POA: Diagnosis not present

## 2015-10-22 LAB — GLUCOSE, CAPILLARY: Glucose-Capillary: 105 mg/dL — ABNORMAL HIGH (ref 65–99)

## 2015-10-22 NOTE — H&P (Addendum)
[facility] [place] NAME: Jessica Erickson   MR#: [id number]  DATE OF BIRTH: [birth year]  DATE OF ADMISSION: 10/08/2015  PRIMARY CARE PHYSICIAN: Volanda Napoleon, MD   REQUESTING/REFERRING PHYSICIAN:   CHIEF COMPLAINT:  Chief Complaint  Patient presents with  . Shortness of Breath    HISTORY OF PRESENT ILLNESS: Jessica Erickson is a 81 y.o. female with a known history of COPD hypertension diabetes hyperlipidemia who presents to the hospital with complaints of shortness of breath. Separated patient had kyphoplasty on 09/21/2015 after fall and after that she was discharged to [facility] for rehabilitation. She does ago patient had that vigorous nausea and vomiting episode, yesterday and today however she was noted to have hemoptysis. And shortness of breath. Patient did have portable chest x-ray done in the facility which was concerning for spontaneous pneumothorax and she was sent to emergency room for further evaluation. Chest x-ray in the emergency room revealed mild left basilar atelectasis , right lung scarring and stable chronic interstitial lung disease. Patient admits of coughing some yellow phlegm. Patient was febrile in the emergency room to 100.5. Hospitalist services conducted for admission.   PAST MEDICAL HISTORY:  Past Medical History  Diagnosis Date  . History of kidney problems   . Diabetes ([place])   . Asthma   . Osteoarthritis   . Bilateral swelling of feet     and legs  . Neuropathy ([place])     PAST SURGICAL HISTORY:  Past Surgical History  Procedure Laterality Date  . Brain tumor surgery    . Thyroid surgery    . Throat surgery    . Cholecystectomy      SOCIAL HISTORY:  Social History  Substance Use Topics  . Smoking status: Never Smoker   . Smokeless tobacco: Never Used  . Alcohol Use: No    FAMILY HISTORY:  Family  History  Problem Relation Age of Onset  . Diabetes Mother     DRUG ALLERGIES:  Allergies  Allergen Reactions  . Penicillins Shortness Of Breath, Rash and Other (See Comments)    Has patient had a PCN reaction causing immediate rash, facial/tongue/throat swelling, SOB or lightheadedness with hypotension: Yes Has patient had a PCN reaction causing severe rash involving mucus membranes or skin necrosis: No Has patient had a PCN reaction that required hospitalization No Has patient had a PCN reaction occurring within the last 10 years: No If all of the above answers are "NO", then may proceed with Cephalosporin use.  . Sulfa Antibiotics Shortness Of Breath and Rash  . Aspirin Other (See Comments)    Reaction: Unknown     Review of Systems  Constitutional: Negative for fever, chills, weight loss and malaise/fatigue.  HENT: Negative for congestion.  Eyes: Negative for blurred vision and double vision.  Respiratory: Positive for cough, hemoptysis, sputum production and shortness of breath. Negative for wheezing.  Cardiovascular: Negative for chest pain, palpitations, orthopnea, leg swelling and PND.  Gastrointestinal: Negative for nausea, vomiting, abdominal pain, diarrhea, constipation, blood in stool and melena.  Genitourinary: Negative for dysuria, urgency, frequency and hematuria.  Musculoskeletal: Negative for falls.  Skin: Negative for rash.  Neurological: Negative for dizziness and weakness.  Psychiatric/Behavioral: Negative for depression and memory loss. The patient is not nervous/anxious.    MEDICATIONS AT HOME:  Prior to Admission medications   Medication Sig Start Date End Date Taking? Authorizing Provider  atorvastatin (LIPITOR) 40 MG tablet Take 40 mg by mouth  daily.   Yes Historical Provider, MD  Calcium Carbonate-Vitamin D (CALCIUM 600+D) 600-400 MG-UNIT tablet Take 1 tablet by mouth daily.   Yes Historical Provider, MD   carbamazepine (TEGRETOL) 200 MG tablet Take 200 mg by mouth 2 (two) times daily.   Yes Historical Provider, MD  cloNIDine (CATAPRES) 0.1 MG tablet Take 0.1 mg by mouth at bedtime.    Yes Historical Provider, MD  diphenhydramine-acetaminophen (TYLENOL PM) 25-500 MG TABS tablet Take 1 tablet by mouth at bedtime as needed (for sleep).   Yes Historical Provider, MD  fosinopril (MONOPRIL) 10 MG tablet Take 10 mg by mouth daily.   Yes Historical Provider, MD  guaifenesin (ROBITUSSIN) 100 MG/5ML syrup Take 200 mg by mouth every 4 (four) hours as needed for cough.   Yes Historical Provider, MD  levothyroxine (LEVOXYL) 150 MCG tablet Take 150 mcg by mouth daily.    Yes Historical Provider, MD  loperamide (IMODIUM) 2 MG capsule Take 2-4 mg by mouth as needed for diarrhea or loose stools.   Yes Historical Provider, MD  magnesium hydroxide (MILK OF MAGNESIA) 400 MG/5ML suspension Take 30 mLs by mouth every 4 (four) hours as needed for mild constipation.   Yes Historical Provider, MD  metFORMIN (GLUCOPHAGE) 500 MG tablet Take 500 mg by mouth daily with breakfast.    Yes Historical Provider, MD  Multiple Vitamin (MULTIVITAMIN WITH MINERALS) TABS tablet Take 1 tablet by mouth daily.   Yes Historical Provider, MD  omeprazole (PRILOSEC) 40 MG capsule Take 80 mg by mouth daily.   Yes Historical Provider, MD  ondansetron (ZOFRAN) 4 MG tablet Take 4 mg by mouth every 6 (six) hours as needed for nausea or vomiting.   Yes Historical Provider, MD  oxyCODONE (OXY IR/ROXICODONE) 5 MG immediate release tablet Take 5 mg by mouth every 4 (four) hours as needed for severe pain.   Yes Historical Provider, MD  polyethylene glycol (MIRALAX / GLYCOLAX) packet Take 17 g by mouth daily.   Yes Historical Provider, MD  tiotropium (SPIRIVA) 18 MCG inhalation capsule Place 18 mcg into inhaler and inhale daily.   Yes Historical Provider, MD   acetaminophen (TYLENOL) 325 MG tablet Take 2 tablets (650 mg total) by mouth every 6 (six) hours as needed. Patient not taking: Reported on 10/08/2015 09/15/15   Carrie Mew, MD  lisinopril (PRINIVIL,ZESTRIL) 10 MG tablet Take 1 tablet (10 mg total) by mouth daily. Patient not taking: Reported on 10/08/2015 09/18/15   Henreitta Leber, MD     PHYSICAL EXAMINATION:   VITAL SIGNS: Blood pressure 101/52, pulse 69, temperature 100.5 F (38.1 C), temperature source Oral, resp. rate 27, height 5\' 9"  (1.753 m), weight 82.101 kg (181 lb), SpO2 96 %.  GENERAL: 81 y.o.-year-old patient lying in the bed with no acute distress.  EYES: Pupils equal, round, reactive to light and accommodation. No scleral icterus. Extraocular muscles intact.  HEENT: Head atraumatic, normocephalic. Oropharynx and nasopharynx clear.  NECK: Supple, no jugular venous distention. No thyroid enlargement, no tenderness.  LUNGS: Normal breath sounds bilaterally, some diminished at basis, no wheezing, rales,rhonchi or crepitation. No use of accessory muscles of respiration.  CARDIOVASCULAR: S1, S2 normal. No murmurs, rubs, or gallops.  ABDOMEN: Soft, nontender, nondistended. Bowel sounds present. No organomegaly or mass. No CVA tenderness but as she did on percussion, mild tenderness in the lumbar area palpation just below kyphoplasty scars  EXTREMITIES: No pedal edema, cyanosis, or clubbing.  NEUROLOGIC: Cranial nerves II through XII are intact. Muscle strength 5/5 in  all extremities. Sensation intact. Gait not checked.  PSYCHIATRIC: The patient is alert and oriented x 3.  SKIN: No obvious rash, lesion, or ulcer.   LABORATORY PANEL:   CBC  Last Labs      Recent Labs Lab 10/08/15 1605  WBC 12.4*  HGB 12.4  HCT 38.0  PLT 180  MCV 92.4  MCH 30.3  MCHC 32.8  RDW 13.8  LYMPHSABS 1.4  MONOABS 1.3*  EOSABS 0.0  BASOSABS 0.0      ------------------------------------------------------------------------------------------------------------------  Chemistries   Last Labs      Recent Labs Lab 10/08/15 1605  NA 135  K 4.3  CL 97*  CO2 28  GLUCOSE 151*  BUN 13  CREATININE 0.90  CALCIUM 8.7*  AST 18  ALT 14  ALKPHOS 100  BILITOT 0.5     ------------------------------------------------------------------------------------------------------------------  Cardiac Enzymes  Last Labs      Recent Labs Lab 10/08/15 1605  TROPONINI <0.03     ------------------------------------------------------------------------------------------------------------------  RADIOLOGY:  Imaging Results (Last 48 hours)    Dg Chest 2 View  10/08/2015 CLINICAL DATA: Shortness of breath. Clinical concern for a spontaneous pneumothorax. Right chest pain this morning. Hemoptysis and hypoxia. EXAM: CHEST 2 VIEW COMPARISON: Previous examinations, the most recent dated 09/16/2015. FINDINGS: Poor inspiration. Normal sized heart. Stable mild linear density in the right lower lung zone and mildly increased linear density at the left lung base. Mildly prominent interstitial markings. Thoracolumbar vertebral compression deformity and associated kyphoplasty material. There is linear density extending posteriorly from this area, possibly representing kyphoplasty material in the injection tract. IMPRESSION: 1. Poor inspiration with mild left basilar atelectasis. 2. Stable linear scarring at the right lung base. 3. Stable mild chronic interstitial lung disease. Electronically Signed By: Claudie Revering M.D. On: 10/08/2015 16:33     EKG: Orders placed or performed during the hospital encounter of 10/08/15  . ED EKG  . ED EKG  . EKG 12-Lead  . EKG 12-Lead  . EKG 12-Lead  . EKG 12-Lead    IMPRESSION AND PLAN:  Principal Problem:  Hemoptysis Active Problems:  Healthcare-associated  pneumonia  UTI (lower urinary tract infection)  Back pain 1. Hemoptysis due to unclear etiology at present possibly pneumonia related versus pharyngeal irritation due to recent vomiting, a CT angiogram to rule out pulmonary embolism, follow clinically 2. Suspected bacterial pneumonia health care associated, continue vancomycin as well as aztreonam intravenously, get sputum cultures 3. UTI, urine cultures, continue antibiotic 4. Back pain, continue outpatient medications   All the records are reviewed and case discussed with ED provider. Management plans discussed with the patient, family and they are in agreement.  CODE STATUS:  Full code  TOTAL TIME TAKING CARE OF THIS PATIENT: 60 minutes.    Theodoro Grist M.D on 10/08/2015 at 7:07 PM  Between 7am to 6pm - Pager - [phone] After 6pm go to [email or web address] - password [email or web address]  Office [phone]  CC: Primary care physician; Volanda Napoleon, MD

## 2015-10-23 LAB — GLUCOSE, CAPILLARY
Glucose-Capillary: 107 mg/dL — ABNORMAL HIGH (ref 65–99)
Glucose-Capillary: 94 mg/dL (ref 65–99)
Glucose-Capillary: 108 mg/dL — ABNORMAL HIGH (ref 65–99)

## 2015-10-24 DIAGNOSIS — I82409 Acute embolism and thrombosis of unspecified deep veins of unspecified lower extremity: Secondary | ICD-10-CM | POA: Diagnosis not present

## 2015-10-24 LAB — GLUCOSE, CAPILLARY: Glucose-Capillary: 93 mg/dL (ref 65–99)

## 2015-10-25 DIAGNOSIS — I82409 Acute embolism and thrombosis of unspecified deep veins of unspecified lower extremity: Secondary | ICD-10-CM | POA: Diagnosis not present

## 2015-10-25 LAB — PROTIME-INR
INR: 1.18
Prothrombin Time: 15.2 seconds — ABNORMAL HIGH (ref 11.4–15.0)

## 2015-10-27 ENCOUNTER — Other Ambulatory Visit
Admission: RE | Admit: 2015-10-27 | Discharge: 2015-10-27 | Disposition: A | Discharge status: Home / Self Care | Payer: Medicare PPO | Source: Skilled Nursing Facility | Attending: Gerontology | Admitting: Gerontology

## 2015-10-27 DIAGNOSIS — I82409 Acute embolism and thrombosis of unspecified deep veins of unspecified lower extremity: Secondary | ICD-10-CM | POA: Diagnosis not present

## 2015-10-27 LAB — PROTIME-INR
INR: 1.22
Prothrombin Time: 15.6 seconds — ABNORMAL HIGH (ref 11.4–15.0)

## 2015-10-30 DIAGNOSIS — I82409 Acute embolism and thrombosis of unspecified deep veins of unspecified lower extremity: Secondary | ICD-10-CM | POA: Diagnosis not present

## 2015-10-30 LAB — CBC WITH DIFFERENTIAL/PLATELET
Basophils Absolute: 0 10*3/uL (ref 0–0.1)
Basophils Relative: 1 %
Eosinophils Absolute: 0.1 10*3/uL (ref 0–0.7)
Eosinophils Relative: 3 %
HCT: 38.5 % (ref 35.0–47.0)
Hemoglobin: 12.5 g/dL (ref 12.0–16.0)
Lymphocytes Relative: 36 %
Lymphs Abs: 1.4 10*3/uL (ref 1.0–3.6)
MCH: 30 pg (ref 26.0–34.0)
MCHC: 32.5 g/dL (ref 32.0–36.0)
MCV: 92.3 fL (ref 80.0–100.0)
Monocytes Absolute: 0.4 10*3/uL (ref 0.2–0.9)
Monocytes Relative: 11 %
Neutro Abs: 2 10*3/uL (ref 1.4–6.5)
Neutrophils Relative %: 49 %
Platelets: 211 10*3/uL (ref 150–440)
RBC: 4.17 MIL/uL (ref 3.80–5.20)
RDW: 14.8 % — ABNORMAL HIGH (ref 11.5–14.5)
WBC: 4 10*3/uL (ref 3.6–11.0)

## 2015-10-30 LAB — PROTIME-INR
INR: 1.2
Prothrombin Time: 15.4 seconds — ABNORMAL HIGH (ref 11.4–15.0)

## 2015-11-03 DIAGNOSIS — I82409 Acute embolism and thrombosis of unspecified deep veins of unspecified lower extremity: Secondary | ICD-10-CM | POA: Diagnosis not present

## 2015-11-03 LAB — PROTIME-INR
INR: 1.24
Prothrombin Time: 15.8 seconds — ABNORMAL HIGH (ref 11.4–15.0)

## 2015-11-04 ENCOUNTER — Encounter
Admission: RE | Admit: 2015-11-04 | Discharge: 2015-11-04 | Disposition: A | Discharge status: Home / Self Care | Payer: Medicare PPO | Source: Skilled Nursing Facility | Attending: Internal Medicine | Admitting: Internal Medicine

## 2015-11-04 DIAGNOSIS — Z882 Allergy status to sulfonamides status: Secondary | ICD-10-CM | POA: Insufficient documentation

## 2015-11-04 DIAGNOSIS — I82409 Acute embolism and thrombosis of unspecified deep veins of unspecified lower extremity: Secondary | ICD-10-CM | POA: Insufficient documentation

## 2015-11-04 DIAGNOSIS — Z880 Allergy status to penicillin: Secondary | ICD-10-CM | POA: Diagnosis not present

## 2015-11-04 DIAGNOSIS — I2699 Other pulmonary embolism without acute cor pulmonale: Secondary | ICD-10-CM | POA: Insufficient documentation

## 2015-11-04 LAB — PROTIME-INR
INR: 1.33
Prothrombin Time: 16.6 seconds — ABNORMAL HIGH (ref 11.4–15.0)

## 2015-11-05 ENCOUNTER — Encounter
Admission: RE | Admit: 2015-11-05 | Discharge: 2015-11-05 | Disposition: A | Discharge status: Home / Self Care | Payer: Medicare PPO | Source: Ambulatory Visit | Attending: *Deleted | Admitting: *Deleted

## 2015-11-05 DIAGNOSIS — I2699 Other pulmonary embolism without acute cor pulmonale: Secondary | ICD-10-CM | POA: Diagnosis not present

## 2015-11-05 LAB — PROTIME-INR
INR: 1.35
Prothrombin Time: 16.8 seconds — ABNORMAL HIGH (ref 11.4–15.0)

## 2015-11-22 ENCOUNTER — Ambulatory Visit: Payer: Medicare PPO

## 2015-12-06 ENCOUNTER — Emergency Department
Admission: EM | Admit: 2015-12-06 | Discharge: 2015-12-07 | Disposition: A | Discharge status: Home / Self Care | Payer: Medicare Other | Attending: Emergency Medicine | Admitting: Emergency Medicine

## 2015-12-06 DIAGNOSIS — Z79899 Other long term (current) drug therapy: Secondary | ICD-10-CM | POA: Insufficient documentation

## 2015-12-06 DIAGNOSIS — Z880 Allergy status to penicillin: Secondary | ICD-10-CM | POA: Insufficient documentation

## 2015-12-06 DIAGNOSIS — N3091 Cystitis, unspecified with hematuria: Secondary | ICD-10-CM | POA: Insufficient documentation

## 2015-12-06 DIAGNOSIS — Z7984 Long term (current) use of oral hypoglycemic drugs: Secondary | ICD-10-CM | POA: Diagnosis not present

## 2015-12-06 DIAGNOSIS — R319 Hematuria, unspecified: Secondary | ICD-10-CM

## 2015-12-06 DIAGNOSIS — Z7901 Long term (current) use of anticoagulants: Secondary | ICD-10-CM | POA: Insufficient documentation

## 2015-12-06 DIAGNOSIS — N3090 Cystitis, unspecified without hematuria: Secondary | ICD-10-CM

## 2015-12-06 DIAGNOSIS — E119 Type 2 diabetes mellitus without complications: Secondary | ICD-10-CM | POA: Insufficient documentation

## 2015-12-06 NOTE — ED Notes (Signed)
To ED via EMS from [facility] for hematuria. Saw Dr. Marcellus Scott and diagnosed with a bladder infection however was not started on any medications. Tonight noticed that she was urinating bright red blood. Patient arrives alert and oriented to person and place. Patient able to answer questions without difficulty.

## 2015-12-07 ENCOUNTER — Emergency Department: Payer: Medicare Other

## 2015-12-07 LAB — CBC
HCT: 35.6 % (ref 35.0–47.0)
Hemoglobin: 11.7 g/dL — ABNORMAL LOW (ref 12.0–16.0)
MCH: 29.2 pg (ref 26.0–34.0)
MCHC: 32.9 g/dL (ref 32.0–36.0)
MCV: 88.6 fL (ref 80.0–100.0)
Platelets: 221 10*3/uL (ref 150–440)
RBC: 4.02 MIL/uL (ref 3.80–5.20)
RDW: 14.8 % — ABNORMAL HIGH (ref 11.5–14.5)
WBC: 6.8 10*3/uL (ref 3.6–11.0)

## 2015-12-07 LAB — URINALYSIS COMPLETE WITH MICROSCOPIC (ARMC ONLY)
Bacteria, UA: NONE SEEN
Specific Gravity, Urine: 1.015 (ref 1.005–1.030)
Squamous Epithelial / LPF: NONE SEEN

## 2015-12-07 LAB — COMPREHENSIVE METABOLIC PANEL
ALT: 20 U/L (ref 14–54)
AST: 18 U/L (ref 15–41)
Albumin: 3.6 g/dL (ref 3.5–5.0)
Alkaline Phosphatase: 68 U/L (ref 38–126)
Anion gap: 6 (ref 5–15)
BUN: 19 mg/dL (ref 6–20)
CO2: 30 mmol/L (ref 22–32)
Calcium: 8.4 mg/dL — ABNORMAL LOW (ref 8.9–10.3)
Chloride: 104 mmol/L (ref 101–111)
Creatinine, Ser: 0.94 mg/dL (ref 0.44–1.00)
GFR calc Af Amer: >60 mL/min (ref >60)
GFR calc non Af Amer: 53 mL/min — ABNORMAL LOW (ref >60)
Glucose, Bld: 108 mg/dL — ABNORMAL HIGH (ref 65–99)
Potassium: 4.4 mmol/L (ref 3.5–5.1)
Sodium: 140 mmol/L (ref 135–145)
Total Bilirubin: 0.4 mg/dL (ref 0.3–1.2)
Total Protein: 6.3 g/dL — ABNORMAL LOW (ref 6.5–8.1)

## 2015-12-07 LAB — PROTIME-INR
INR: 2.46
Prothrombin Time: 26.4 seconds — ABNORMAL HIGH (ref 11.4–15.0)

## 2015-12-07 MED ORDER — CIPROFLOXACIN HCL 500 MG PO TABS
500.0000 mg | ORAL_TABLET | Freq: Once | ORAL | Status: AC
Start: 2015-12-07 — End: 2015-12-07
  Administered 2015-12-07: 500 mg via ORAL
  Filled 2015-12-07: qty 1

## 2015-12-07 MED ORDER — CIPROFLOXACIN HCL 500 MG PO TABS
500.0000 mg | ORAL_TABLET | Freq: Two times a day (BID) | ORAL | Status: DC
Start: 2015-12-07 — End: 2015-12-16

## 2015-12-07 MED ORDER — CEPHALEXIN 500 MG PO CAPS: 500.0000 mg | ORAL_CAPSULE | Freq: Two times a day (BID) | ORAL | Status: DC

## 2015-12-07 MED ORDER — CEPHALEXIN 500 MG PO CAPS
500.0000 mg | ORAL_CAPSULE | Freq: Once | ORAL | Status: AC
  Administered 2015-12-07: 500 mg via ORAL

## 2015-12-07 MED ORDER — CEPHALEXIN 500 MG PO CAPS
ORAL_CAPSULE | ORAL | Status: AC
  Administered 2015-12-07: 500 mg via ORAL
  Filled 2015-12-07: qty 1

## 2015-12-07 NOTE — ED Provider Notes (Signed)
[facility] Emergency Department Provider Note  ____________________________________________  Time seen: 11:45PM  I have reviewed the triage vital signs and the nursing notes.   HISTORY  Chief Complaint Hematuria     HPI Jessica Erickson is a 82 y.o. female presents with pain with hematuria noticed tonight. However patient does admit to urinary frequency 4 days. Patient denies any fever, nausea no vomiting no back pain or abdominal pain. Of note patient was diagnosed with a "blood clot in October for which she is taking anticoagulation Coumadin and Lovenox.    Past Medical History  Diagnosis Date  . History of kidney problems   . Diabetes ([place])   . Asthma   . Osteoarthritis   . Bilateral swelling of feet     and legs  . Neuropathy [facility])     Patient Active Problem List   Diagnosis Date Noted  . Hemoptysis 10/08/2015  . Healthcare-associated pneumonia 10/08/2015  . UTI (lower urinary tract infection) 10/08/2015  . Back pain 10/08/2015  . Vomiting 09/16/2015    Past Surgical History  Procedure Laterality Date  . Brain tumor surgery    . Thyroid surgery    . Throat surgery    . Cholecystectomy    . Peripheral vascular catheterization N/A 10/11/2015    Procedure: IVC Filter Insertion;  Surgeon: Algernon Huxley, MD;  Location: [place] [facility];  Service: Cardiovascular;  Laterality: N/A;    Current Outpatient Rx  Name  Route  Sig  Dispense  Refill  . acetaminophen (TYLENOL) 325 MG tablet   Oral   Take 2 tablets (650 mg total) by mouth every 6 (six) hours as needed. Patient not taking: Reported on 10/08/2015   60 tablet   0   . atorvastatin (LIPITOR) 40 MG tablet   Oral   Take 40 mg by mouth daily.         . Calcium Carbonate-Vitamin D (CALCIUM 600+D) 600-400 MG-UNIT tablet   Oral   Take 1 tablet by mouth daily.         . carbamazepine (TEGRETOL) 200 MG tablet   Oral   Take 200 mg by mouth 2 (two) times daily.         .  cloNIDine (CATAPRES) 0.1 MG tablet   Oral   Take 0.1 mg by mouth at bedtime.          . diphenhydramine-acetaminophen (TYLENOL PM) 25-500 MG TABS tablet   Oral   Take 1 tablet by mouth at bedtime as needed (for sleep).         . enoxaparin (LOVENOX) 150 MG/ML injection   Subcutaneous   Inject 0.55 mLs (85 mg total) into the skin every 12 (twelve) hours.   22.4 Syringe   0   . fosinopril (MONOPRIL) 10 MG tablet   Oral   Take 10 mg by mouth daily.         Marland Kitchen guaifenesin (ROBITUSSIN) 100 MG/5ML syrup   Oral   Take 200 mg by mouth every 4 (four) hours as needed for cough.         . levothyroxine (LEVOXYL) 150 MCG tablet   Oral   Take 150 mcg by mouth daily.          Marland Kitchen lisinopril (PRINIVIL,ZESTRIL) 10 MG tablet   Oral   Take 1 tablet (10 mg total) by mouth daily. Patient not taking: Reported on 10/08/2015         . loperamide (IMODIUM) 2 MG capsule  Oral   Take 2-4 mg by mouth as needed for diarrhea or loose stools.         . magnesium hydroxide (MILK OF MAGNESIA) 400 MG/5ML suspension   Oral   Take 30 mLs by mouth every 4 (four) hours as needed for mild constipation.         . metFORMIN (GLUCOPHAGE) 500 MG tablet   Oral   Take 500 mg by mouth daily with breakfast.          . Multiple Vitamin (MULTIVITAMIN WITH MINERALS) TABS tablet   Oral   Take 1 tablet by mouth daily.         Marland Kitchen omeprazole (PRILOSEC) 40 MG capsule   Oral   Take 80 mg by mouth daily.         . ondansetron (ZOFRAN) 4 MG tablet   Oral   Take 4 mg by mouth every 6 (six) hours as needed for nausea or vomiting.         Marland Kitchen oxyCODONE (OXY IR/ROXICODONE) 5 MG immediate release tablet   Oral   Take 1 tablet (5 mg total) by mouth every 4 (four) hours as needed for severe pain.   20 tablet   0   . polyethylene glycol (MIRALAX / GLYCOLAX) packet   Oral   Take 17 g by mouth daily.         Marland Kitchen tiotropium (SPIRIVA) 18 MCG inhalation capsule   Inhalation   Place 18 mcg into inhaler  and inhale daily.         Marland Kitchen warfarin (COUMADIN) 6 MG tablet   Oral   Take 1 tablet (6 mg total) by mouth daily.   7 tablet   0     Allergies Penicillins; Sulfa antibiotics; and Aspirin  Family History  Problem Relation Age of Onset  . Diabetes Mother     Social History Social History  Substance Use Topics  . Smoking status: Never Smoker   . Smokeless tobacco: Never Used  . Alcohol Use: No    Review of Systems  Constitutional: Negative for fever. Eyes: Negative for visual changes. ENT: Negative for sore throat. Cardiovascular: Negative for chest pain. Respiratory: Negative for shortness of breath. Gastrointestinal: Negative for abdominal pain, vomiting and diarrhea. Genitourinary: Negative for dysuria. Musculoskeletal: Negative for back pain. Skin: Negative for rash. Neurological: Negative for headaches, focal weakness or numbness.   10-point ROS otherwise negative.  ____________________________________________   PHYSICAL EXAM:  VITAL SIGNS: ED Triage Vitals  Enc Vitals Group     BP 12/06/15 2335 122/72 mmHg     Pulse Rate 12/06/15 2335 70     Resp 12/06/15 2335 20     Temp 12/06/15 2335 97.6 F (36.4 C)     Temp Source 12/06/15 2335 Oral     SpO2 12/06/15 2335 94 %     Weight 12/06/15 2335 171 lb (77.565 kg)     Height 12/06/15 2335 5\' 9"  (1.753 m)     Head Cir --      Peak Flow --      Pain Score 12/06/15 2337 0     Pain Loc --      Pain Edu? --      Excl. in [place]? --      Constitutional: Alert and oriented. Well appearing and in no distress. Eyes: Conjunctivae are normal. PERRL. Normal extraocular movements. ENT   Head: Normocephalic and atraumatic.   Nose: No congestion/rhinnorhea.   Mouth/Throat: Mucous membranes are moist.  Neck: No stridor. Hematological/Lymphatic/Immunilogical: No cervical lymphadenopathy. Cardiovascular: Normal rate, regular rhythm. Normal and symmetric distal pulses are present in all extremities. No  murmurs, rubs, or gallops. Respiratory: Normal respiratory effort without tachypnea nor retractions. Breath sounds are clear and equal bilaterally. No wheezes/rales/rhonchi. Gastrointestinal: Soft and nontender. No distention. There is no CVA tenderness. Genitourinary: deferred Musculoskeletal: Nontender with normal range of motion in all extremities. No joint effusions.  No lower extremity tenderness nor edema. Neurologic:  Normal speech and language. No gross focal neurologic deficits are appreciated. Speech is normal.  Skin:  Skin is warm, dry and intact. No rash noted. Psychiatric: Mood and affect are normal. Speech and behavior are normal. Patient exhibits appropriate insight and judgment.  ____________________________________________    LABS (pertinent positives/negatives)  Labs Reviewed  URINALYSIS COMPLETEWITH MICROSCOPIC ([facility] ONLY) - Abnormal; Notable for the following:    Color, Urine RED (*)    APPearance CLOUDY (*)    Glucose, UA SEE COMMENTS (*)    Bilirubin Urine SEE COMMENTS (*)    Ketones, ur SEE COMMENTS (*)    Hgb urine dipstick SEE COMMENTS (*)    Protein, ur SEE COMMENTS (*)    Nitrite SEE COMMENTS (*)    Leukocytes, UA SEE COMMENTS (*)    All other components within normal limits  CBC - Abnormal; Notable for the following:    Hemoglobin 11.7 (*)    RDW 14.8 (*)    All other components within normal limits  COMPREHENSIVE METABOLIC PANEL - Abnormal; Notable for the following:    Glucose, Bld 108 (*)    Calcium 8.4 (*)    Total Protein 6.3 (*)    GFR calc non Af Amer 53 (*)    All other components within normal limits  PROTIME-INR - Abnormal; Notable for the following:    Prothrombin Time 26.4 (*)    All other components within normal limits  URINE CULTURE         RADIOLOGY CT RENAL STONE STUDY (Final result) Result time: 12/07/15 01:35:19   Final result by Rad Results In Interface (12/07/15 01:35:19)   Narrative:   CLINICAL DATA: Acute onset  of hematuria. Initial encounter.  EXAM: CT ABDOMEN AND PELVIS WITHOUT CONTRAST  TECHNIQUE: Multidetector CT imaging of the abdomen and pelvis was performed following the standard protocol without IV contrast.  COMPARISON: CT of the abdomen and pelvis from 09/15/2015, and MRI of the lumbar spine performed 09/18/2015  FINDINGS: Mild bibasilar scarring is noted. Diffuse coronary artery calcifications are seen.  The liver and spleen are unremarkable in appearance. The patient is status post cholecystectomy, with clips noted along the gallbladder fossa. The pancreas and adrenal glands are unremarkable.  The kidneys are unremarkable in appearance. There is no evidence of hydronephrosis. No renal or ureteral stones are seen. No perinephric stranding is appreciated.  No free fluid is identified. The small bowel is unremarkable in appearance. The stomach is within normal limits. No acute vascular abnormalities are seen. Scattered calcification is noted along the abdominal aorta and its branches. An IVC filter is noted in expected position.  The appendix is not definitely characterized; there is no evidence for appendicitis. The colon is largely filled with stool, aside from the sigmoid colon, which is mostly empty.  The bladder is mildly distended. Mild bladder wall thickening could reflect mild cystitis. Dense calcification is suggested about the urethra. The uterus is unremarkable in appearance. The ovaries are grossly symmetric. No suspicious adnexal masses are seen. No inguinal lymphadenopathy is  seen.  No acute osseous abnormalities are identified. There is chronic compression deformity at vertebral body T12, with associated changes of vertebroplasty. Multilevel vacuum phenomenon is noted along the lumbar spine. There is mild grade 1 retrolisthesis of L2 on L3, and mild grade 1 anterolisthesis of L4 on L5. Endplate sclerotic change is noted at L2-L3. Underlying facet  disease is noted along the lumbar spine.  IMPRESSION: 1. Mild bladder wall thickening could reflect cystitis. Dense calcifications suggested about the urethra. If the patient's hematuria persists, cystoscopy could be considered for further evaluation. 2. Colon largely filled with stool, concerning for mild constipation. 3. Scattered calcification along the abdominal aorta and its branches. 4. Mild bibasilar scarring noted. 5. Diffuse coronary artery calcifications seen. 6. Chronic compression deformity of vertebral body T12, with associated changes of vertebroplasty. Degenerative change noted along the lumbar spine.   Electronically Signed By: Garald Balding M.D. On: 12/07/2015 01:35       INITIAL IMPRESSION / ASSESSMENT AND PLAN / ED COURSE  Pertinent labs & imaging results that were available during my care of the patient were reviewed by me and considered in my medical decision making (see chart for details).   ____________________________________________   FINAL CLINICAL IMPRESSION(S) / ED DIAGNOSES  Final diagnoses:  Hematuria  Cystitis      Gregor Hams, MD 12/07/15 [phone]

## 2015-12-07 NOTE — Discharge Instructions (Signed)

## 2015-12-09 ENCOUNTER — Encounter: Payer: Self-pay | Admitting: Internal Medicine

## 2015-12-09 LAB — URINE CULTURE: Culture: >=100000

## 2015-12-13 ENCOUNTER — Ambulatory Visit: Payer: Self-pay | Admitting: Obstetrics and Gynecology

## 2015-12-16 ENCOUNTER — Ambulatory Visit (INDEPENDENT_AMBULATORY_CARE_PROVIDER_SITE_OTHER): Payer: Medicare Other | Admitting: Obstetrics and Gynecology

## 2015-12-16 ENCOUNTER — Encounter: Payer: Self-pay | Admitting: Obstetrics and Gynecology

## 2015-12-16 VITALS — BP 122/76 | HR 75 | Resp 16 | Ht 68.0 in | Wt 177.0 lb

## 2015-12-16 DIAGNOSIS — R310 Gross hematuria: Secondary | ICD-10-CM

## 2015-12-16 DIAGNOSIS — N390 Urinary tract infection, site not specified: Secondary | ICD-10-CM

## 2015-12-16 LAB — URINALYSIS, COMPLETE
Bilirubin, UA: NEGATIVE
Glucose, UA: NEGATIVE
Ketones, UA: NEGATIVE
Leukocytes, UA: NEGATIVE
Nitrite, UA: NEGATIVE
Protein, UA: NEGATIVE
RBC, UA: NEGATIVE
Specific Gravity, UA: 1.01 (ref 1.005–1.030)
Urobilinogen, Ur: 0.2 mg/dL (ref 0.2–1.0)
pH, UA: 6.5 (ref 5.0–7.5)

## 2015-12-16 LAB — MICROSCOPIC EXAMINATION
Bacteria, UA: NONE SEEN
Epithelial Cells (non renal): NONE SEEN /hpf (ref 0–10)
RBC, UA: NONE SEEN /hpf (ref 0–?)

## 2015-12-16 NOTE — Progress Notes (Signed)
In and Out Catheterization  Patient is present today for a I & O catheterization due to hematuria and pt inability to give a clean urine specimen.  Patient was cleaned and prepped in a sterile fashion with betadine.  A 14 FR cath was inserted no complications were noted , 250 ml of urine return was noted, urine was yellow in color. A clean urine sample was collected for urinalysis. Bladder was drained  and catheter was removed with out difficulty.    Preformed by: K.Russell,CMA

## 2015-12-16 NOTE — Progress Notes (Signed)
12/16/2015 3:11 PM   Jessica Erickson 1926/08/28 [id number]  Referring provider: Jodi Marble, MD [address] [place], [place] [address]  Chief Complaint  Patient presents with  . Hematuria  . Establish Care    HPI: Gross hematuria occuring 1 week ago.  She was seen in the ED and diagnosed a urinary tract infection. She was treated with a 10 day course of Keflex.  No further gross hematuria. Urine culture was positive >100,000 E. Coli.  No dysuria.  Does notice urinary frequency in the morning after taking dose of Lasix.  No fevers or flank pain.   Former smoker x 40years 1/2ppd.   History of 1 kidney stone episode in 1950's.     Former patient of Dr. Jacqlyn Larsen. History of recurrent urinary tract infections.  S/p bladder mesh placement many years ago.  5 glasses of water per day.  No vaginal symptoms. Never used vaginal estrogen cream.   CT Renal Stone Study Mild bladder wall thickening could reflect cystitis. Dense calcifications suggested about the urethra.   PMH: Past Medical History  Diagnosis Date  . History of kidney problems   . Diabetes ([place])   . Asthma   . Osteoarthritis   . Bilateral swelling of feet     and legs  . Neuropathy [facility])     Surgical History: Past Surgical History  Procedure Laterality Date  . Brain tumor surgery    . Thyroid surgery    . Throat surgery    . Cholecystectomy    . Peripheral vascular catheterization N/A 10/11/2015    Procedure: IVC Filter Insertion;  Surgeon: Algernon Huxley, MD;  Location: [facility];  Service: Cardiovascular;  Laterality: N/A;    Home Medications:    Medication List       This list is accurate as of: 12/16/15  3:10 PM.  Always use your most recent med list.               acetaminophen 325 MG tablet  Commonly known as:  TYLENOL  Take 2 tablets (650 mg total) by mouth every 6 (six) hours as needed.     CALCIUM 600+D 600-400 MG-UNIT tablet  Generic drug:  Calcium Carbonate-Vitamin D  Take 1  tablet by mouth daily.     carbamazepine 200 MG tablet  Commonly known as:  TEGRETOL  Take 200 mg by mouth 2 (two) times daily.     cephALEXin 500 MG capsule  Commonly known as:  KEFLEX  Take 1 capsule (500 mg total) by mouth 2 (two) times daily.     cloNIDine 0.1 MG tablet  Commonly known as:  CATAPRES  Take 0.1 mg by mouth at bedtime.     enoxaparin 120 MG/0.8ML injection  Commonly known as:  LOVENOX  Inject 120 mg into the skin daily.     fluticasone 50 MCG/ACT nasal spray  Commonly known as:  FLONASE  Place 1 spray into both nostrils daily.     fosinopril 10 MG tablet  Commonly known as:  MONOPRIL  Take 10 mg by mouth daily.     furosemide 20 MG tablet  Commonly known as:  LASIX  Take 20 mg by mouth daily.     LEVOXYL 150 MCG tablet  Generic drug:  levothyroxine  Take 150 mcg by mouth daily.     LIPITOR 40 MG tablet  Generic drug:  atorvastatin  Take 40 mg by mouth daily.     metFORMIN 500 MG tablet  Commonly  known as:  GLUCOPHAGE  Take 500 mg by mouth daily with breakfast.     multivitamin with minerals Tabs tablet  Take 1 tablet by mouth daily.     omeprazole 40 MG capsule  Commonly known as:  PRILOSEC  Take 80 mg by mouth daily.     oxyCODONE 5 MG immediate release tablet  Commonly known as:  Oxy IR/ROXICODONE  Take 1 tablet (5 mg total) by mouth every 4 (four) hours as needed for severe pain.     polyethylene glycol powder powder  Commonly known as:  GLYCOLAX/MIRALAX     tiotropium 18 MCG inhalation capsule  Commonly known as:  SPIRIVA  Place 18 mcg into inhaler and inhale daily.     warfarin 5 MG tablet  Commonly known as:  COUMADIN  Take 15 mg by mouth daily. Reported on 12/16/2015     warfarin 4 MG tablet  Commonly known as:  COUMADIN  Take 4 mg by mouth daily. Reported on 12/16/2015     warfarin 6 MG tablet  Commonly known as:  COUMADIN  Take 1 tablet (6 mg total) by mouth daily.        Allergies:  Allergies  Allergen Reactions   . Penicillins Shortness Of Breath, Rash and Other (See Comments)    Has patient had a PCN reaction causing immediate rash, facial/tongue/throat swelling, SOB or lightheadedness with hypotension: Yes Has patient had a PCN reaction causing severe rash involving mucus membranes or skin necrosis: No Has patient had a PCN reaction that required hospitalization No Has patient had a PCN reaction occurring within the last 10 years: No If all of the above answers are "NO", then may proceed with Cephalosporin use.  . Sulfa Antibiotics Shortness Of Breath and Rash  . Aspirin Other (See Comments)    Reaction:  Unknown     Family History: Family History  Problem Relation Age of Onset  . Diabetes Mother     Social History:  reports that she has never smoked. She has never used smokeless tobacco. She reports that she does not drink alcohol or use illicit drugs.  ROS: UROLOGY Frequent Urination?: Yes Hard to postpone urination?: Yes Burning/pain with urination?: No Get up at night to urinate?: Yes Leakage of urine?: Yes Urine stream starts and stops?: Yes Trouble starting stream?: No Do you have to strain to urinate?: Yes Blood in urine?: Yes Urinary tract infection?: Yes Sexually transmitted disease?: No Injury to kidneys or bladder?: No Painful intercourse?: No Weak stream?: No Currently pregnant?: No Vaginal bleeding?: No Last menstrual period?: n  Gastrointestinal Nausea?: No Vomiting?: No Indigestion/heartburn?: No Diarrhea?: No Constipation?: No  Constitutional Fever: No Night sweats?: No Weight loss?: Yes Fatigue?: No  Skin Skin rash/lesions?: No Itching?: No  Eyes Blurred vision?: No Double vision?: No  Ears/Nose/Throat Sore throat?: No Sinus problems?: No  Hematologic/Lymphatic Swollen glands?: No Easy bruising?: Yes  Cardiovascular Leg swelling?: Yes Chest pain?: No  Respiratory Cough?: Yes Shortness of breath?: Yes  Endocrine Excessive  thirst?: Yes  Musculoskeletal Back pain?: Yes Joint pain?: No  Neurological Headaches?: Yes Dizziness?: Yes  Psychologic Depression?: No Anxiety?: No  Physical Exam: BP 122/76 mmHg  Pulse 75  Resp 16  Ht 5\' 8"  (1.727 m)  Wt 177 lb (80.287 kg)  BMI 26.92 kg/m2  LMP  (LMP Unknown)  Constitutional:  Alert and oriented, No acute distress. HEENT: [place] AT, moist mucus membranes.  Trachea midline, no masses. Cardiovascular: No clubbing, cyanosis, or edema. Respiratory: Normal respiratory  effort, no increased work of breathing. GI: Abdomen is soft, nontender, nondistended, no abdominal masses GU: No CVA tenderness.  Skin: No rashes, bruises or suspicious lesions. Lymph: No cervical or inguinal adenopathy. Neurologic: Grossly intact, no focal deficits, moving all 4 extremities. Psychiatric: Normal mood and affect.  Laboratory Data: Results for orders placed or performed during the hospital encounter of 12/06/15  Urine culture  Result Value Ref Range   Specimen Description URINE, CLEAN CATCH    Special Requests NONE    Culture >=100,000 COLONIES/mL ESCHERICHIA COLI    Report Status 12/09/2015 FINAL    Organism ID, Bacteria ESCHERICHIA COLI       Susceptibility   Escherichia coli - MIC*    AMPICILLIN 16 INTERMEDIATE Intermediate     CEFAZOLIN <=4 SENSITIVE Sensitive     CEFTRIAXONE <=1 SENSITIVE Sensitive     CIPROFLOXACIN <=0.25 SENSITIVE Sensitive     GENTAMICIN <=1 SENSITIVE Sensitive     IMIPENEM <=0.25 SENSITIVE Sensitive     NITROFURANTOIN <=16 SENSITIVE Sensitive     TRIMETH/SULFA <=20 SENSITIVE Sensitive     Extended ESBL NEGATIVE Sensitive     PIP/TAZO Value in next row Sensitive      SENSITIVE<=4    ERTAPENEM Value in next row Sensitive      SENSITIVE<=0.5    LEVOFLOXACIN Value in next row Sensitive      SENSITIVE0.5    * >=100,000 COLONIES/mL ESCHERICHIA COLI  Urinalysis complete, with microscopic ([facility] only)  Result Value Ref Range   Color, Urine RED (A)  YELLOW   APPearance CLOUDY (A) CLEAR   Glucose, UA SEE COMMENTS (A) NEGATIVE mg/dL   Bilirubin Urine SEE COMMENTS (A) NEGATIVE   Ketones, ur SEE COMMENTS (A) NEGATIVE mg/dL   Specific Gravity, Urine 1.015 1.005 - 1.030   Hgb urine dipstick SEE COMMENTS (A) NEGATIVE   pH SEE COMMENTS 5.0 - 8.0   Protein, ur SEE COMMENTS (A) NEGATIVE mg/dL   Nitrite SEE COMMENTS (A) NEGATIVE   Leukocytes, UA SEE COMMENTS (A) NEGATIVE   RBC / HPF TOO NUMEROUS TO COUNT 0 - 5 RBC/hpf   WBC, UA TOO NUMEROUS TO COUNT 0 - 5 WBC/hpf   Bacteria, UA NONE SEEN NONE SEEN   Squamous Epithelial / LPF NONE SEEN NONE SEEN   WBC Clumps PRESENT   CBC  Result Value Ref Range   WBC 6.8 3.6 - 11.0 K/uL   RBC 4.02 3.80 - 5.20 MIL/uL   Hemoglobin 11.7 (L) 12.0 - 16.0 g/dL   HCT 35.6 35.0 - 47.0 %   MCV 88.6 80.0 - 100.0 fL   MCH 29.2 26.0 - 34.0 pg   MCHC 32.9 32.0 - 36.0 g/dL   RDW 14.8 (H) 11.5 - 14.5 %   Platelets 221 150 - 440 K/uL  Comprehensive metabolic panel  Result Value Ref Range   Sodium 140 135 - 145 mmol/L   Potassium 4.4 3.5 - 5.1 mmol/L   Chloride 104 101 - 111 mmol/L   CO2 30 22 - 32 mmol/L   Glucose, Bld 108 (H) 65 - 99 mg/dL   BUN 19 6 - 20 mg/dL   Creatinine, Ser 0.94 0.44 - 1.00 mg/dL   Calcium 8.4 (L) 8.9 - 10.3 mg/dL   Total Protein 6.3 (L) 6.5 - 8.1 g/dL   Albumin 3.6 3.5 - 5.0 g/dL   AST 18 15 - 41 U/L   ALT 20 14 - 54 U/L   Alkaline Phosphatase 68 38 - 126 U/L   Total  Bilirubin 0.4 0.3 - 1.2 mg/dL   GFR calc non Af Amer 53 (L) >60 mL/min   GFR calc Af Amer >60 >60 mL/min   Anion gap 6 5 - 15  Protime-INR  Result Value Ref Range   Prothrombin Time 26.4 (H) 11.4 - 15.0 seconds   INR 2.46      Urinalysis Results for orders placed or performed in visit on 12/16/15  Microscopic Examination  Result Value Ref Range   WBC, UA 0-5 0 -  5 /hpf   RBC, UA None seen 0 -  2 /hpf   Epithelial Cells (non renal) None seen 0 - 10 /hpf   Mucus, UA Present (A) Not Estab.   Bacteria, UA None  seen None seen/Few  Urinalysis, Complete  Result Value Ref Range   Specific Gravity, UA 1.010 1.005 - 1.030   pH, UA 6.5 5.0 - 7.5   Color, UA Yellow Yellow   Appearance Ur Clear Clear   Leukocytes, UA Negative Negative   Protein, UA Negative Negative/Trace   Glucose, UA Negative Negative   Ketones, UA Negative Negative   RBC, UA Negative Negative   Bilirubin, UA Negative Negative   Urobilinogen, Ur 0.2 0.2 - 1.0 mg/dL   Nitrite, UA Negative Negative   Microscopic Examination See below:     Pertinent Imaging: CLINICAL DATA: Acute onset of hematuria. Initial encounter.  EXAM: CT ABDOMEN AND PELVIS WITHOUT CONTRAST  TECHNIQUE: Multidetector CT imaging of the abdomen and pelvis was performed following the standard protocol without IV contrast.  COMPARISON: CT of the abdomen and pelvis from 09/15/2015, and MRI of the lumbar spine performed 09/18/2015  FINDINGS: Mild bibasilar scarring is noted. Diffuse coronary artery calcifications are seen.  The liver and spleen are unremarkable in appearance. The patient is status post cholecystectomy, with clips noted along the gallbladder fossa. The pancreas and adrenal glands are unremarkable.  The kidneys are unremarkable in appearance. There is no evidence of hydronephrosis. No renal or ureteral stones are seen. No perinephric stranding is appreciated.  No free fluid is identified. The small bowel is unremarkable in appearance. The stomach is within normal limits. No acute vascular abnormalities are seen. Scattered calcification is noted along the abdominal aorta and its branches. An IVC filter is noted in expected position.  The appendix is not definitely characterized; there is no evidence for appendicitis. The colon is largely filled with stool, aside from the sigmoid colon, which is mostly empty.  The bladder is mildly distended. Mild bladder wall thickening could reflect mild cystitis. Dense calcification is  suggested about the urethra. The uterus is unremarkable in appearance. The ovaries are grossly symmetric. No suspicious adnexal masses are seen. No inguinal lymphadenopathy is seen.  No acute osseous abnormalities are identified. There is chronic compression deformity at vertebral body T12, with associated changes of vertebroplasty. Multilevel vacuum phenomenon is noted along the lumbar spine. There is mild grade 1 retrolisthesis of L2 on L3, and mild grade 1 anterolisthesis of L4 on L5. Endplate sclerotic change is noted at L2-L3. Underlying facet disease is noted along the lumbar spine.  IMPRESSION: 1. Mild bladder wall thickening could reflect cystitis. Dense calcifications suggested about the urethra. If the patient's hematuria persists, cystoscopy could be considered for further evaluation. 2. Colon largely filled with stool, concerning for mild constipation. 3. Scattered calcification along the abdominal aorta and its branches. 4. Mild bibasilar scarring noted. 5. Diffuse coronary artery calcifications seen. 6. Chronic compression deformity of vertebral body T12, with associated  changes of vertebroplasty. Degenerative change noted along the lumbar spine.  Electronically Signed  By: Garald Balding M.D.  On: 12/07/2015 01:35  Assessment & Plan:    1. Gross Hematuria-  hematuria resolved today. UA unremarkable.  CT scan without contrast performed in the emergency department demonstrating mild bladder wall thickening which could reflect cystitis dense calcifications suggested about the urethra. Will proceed with cystoscopy, and CT Urogram if indicated after further discussion with MD, when patient returns for cystoscopy.   Cath UA obtained today using sterile technique by myself.  Patient tolerated procedure well.  - Urinalysis, Complete   2.  Recurrent UTI-  UTI prevention strategies discussed.  Good perineal hygiene reviewed. Patient is encouraged to increase daily  water intake, start cranberry supplements to prevent invasive colonization along the urinary tract and probiotics, especially lactobacillus to restore normal vaginal flora.  3. Urethral calcifications- Seen on CT. Cystoscopy pending.   Return for schedule cystoscopy with Dr. Erlene Quan if possible.  These notes generated with voice recognition software. I apologize for typographical errors.  Herbert Moors, [facility] [address] [place], [place] [address] [phone]

## 2015-12-16 NOTE — Patient Instructions (Signed)
Cystoscopy Cystoscopy is a procedure that is used to help your caregiver diagnose and sometimes treat conditions that affect your lower urinary tract. Your lower urinary tract includes your bladder and the tube through which urine passes from your bladder out of your body (urethra). Cystoscopy is performed with a thin, tube-shaped instrument (cystoscope). The cystoscope has lenses and a light at the end so that your caregiver can see inside your bladder. The cystoscope is inserted at the entrance of your urethra. Your caregiver guides it through your urethra and into your bladder. There are two main types of cystoscopy:  Flexible cystoscopy (with a flexible cystoscope).  Rigid cystoscopy (with a rigid cystoscope). Cystoscopy may be recommended for many conditions, including:  Urinary tract infections.  Blood in your urine (hematuria).  Loss of bladder control (urinary incontinence) or overactive bladder.  Unusual cells found in a urine sample.  Urinary blockage.  Painful urination. Cystoscopy may also be done to remove a sample of your tissue to be checked under a microscope (biopsy). It may also be done to remove or destroy bladder stones. LET YOUR CAREGIVER KNOW ABOUT:  Allergies to food or medicine.  Medicines taken, including vitamins, herbs, eyedrops, over-the-counter medicines, and creams.  Use of steroids (by mouth or creams).  Previous problems with anesthetics or numbing medicines.  History of bleeding problems or blood clots.  Previous surgery.  Other health problems, including diabetes and kidney problems.  Possibility of pregnancy, if this applies. PROCEDURE The area around the opening to your urethra will be cleaned. A medicine to numb your urethra (local anesthetic) is used. If a tissue sample or stone is removed during the procedure, you may be given a medicine to make you sleep (general anesthetic). Your caregiver will gently insert the tip of the cystoscope  into your urethra. The cystoscope will be slowly glided through your urethra and into your bladder. Sterile fluid will flow through the cystoscope and into your bladder. The fluid will expand and stretch your bladder. This gives your caregiver a better view of your bladder walls. The procedure lasts about 15-20 minutes. AFTER THE PROCEDURE If a local anesthetic is used, you will be allowed to go home as soon as you are ready. If a general anesthetic is used, you will be taken to a recovery area until you are stable. You may have temporary bleeding and burning on urination.   This information is not intended to replace advice given to you by your health care provider. Make sure you discuss any questions you have with your health care provider.   Document Released: 11/17/2000 Document Revised: 12/11/2014 Document Reviewed: 05/13/2012 Elsevier Interactive Patient Education 2016 [employer] Hematuria, Adult Hematuria is blood in your urine. It can be caused by a bladder infection, kidney infection, prostate infection, kidney stone, or cancer of your urinary tract. Infections can usually be treated with medicine, and a kidney stone usually will pass through your urine. If neither of these is the cause of your hematuria, further workup to find out the reason may be needed. It is very important that you tell your health care provider about any blood you see in your urine, even if the blood stops without treatment or happens without causing pain. Blood in your urine that happens and then stops and then happens again can be a symptom of a very serious condition. Also, pain is not a symptom in the initial stages of many urinary cancers. HOME CARE INSTRUCTIONS   Drink lots of fluid, 3-4  quarts a day. If you have been diagnosed with an infection, cranberry juice is especially recommended, in addition to large amounts of water.  Avoid caffeine, tea, and carbonated beverages because they tend to irritate the  bladder.  Avoid alcohol because it may irritate the prostate.  Take all medicines as directed by your health care provider.  If you were prescribed an antibiotic medicine, finish it all even if you start to feel better.  If you have been diagnosed with a kidney stone, follow your health care provider's instructions regarding straining your urine to catch the stone.  Empty your bladder often. Avoid holding urine for long periods of time.  After a bowel movement, women should cleanse front to back. Use each tissue only once.  Empty your bladder before and after sexual intercourse if you are a female. SEEK MEDICAL CARE IF:  You develop back pain.  You have a fever.  You have a feeling of sickness in your stomach (nausea) or vomiting.  Your symptoms are not better in 3 days. Return sooner if you are getting worse. SEEK IMMEDIATE MEDICAL CARE IF:   You develop severe vomiting and are unable to keep the medicine down.  You develop severe back or abdominal pain despite taking your medicines.  You begin passing a large amount of blood or clots in your urine.  You feel extremely weak or faint, or you pass out. MAKE SURE YOU:   Understand these instructions.  Will watch your condition.  Will get help right away if you are not doing well or get worse.   This information is not intended to replace advice given to you by your health care provider. Make sure you discuss any questions you have with your health care provider.   Document Released: 11/20/2005 Document Revised: 12/11/2014 Document Reviewed: 07/21/2013 Elsevier Interactive Patient Education 2016 [employer] Inc[employer]                                              Urinary Tract Infection Prevention Patient Education Stay Hydrated: Urinary tract infections (UTIs) are less likely to occur in someone who is drinking enough water to promote regular urination, so it is very important to stay hydrated in order to help flush out  bacteria from the urinary tract. Respond to "Nature's Call": It is always a good idea to urinate as soon as you feel the need. While "holding it in" does not directly cause an infection, it can cause overdistension that can damage the lining of the bladder, making it more vulnerable to bacteria. Remove Tampons Before Going: Remember to always take out tampons before urinating, and change tampons often.  Practice Proper Bathroom Hygiene: To keep bacteria near the urethral opening to a minimum, it is important to practice proper wiping techniques (i.e. front to back wiping) to help prevent rectal bacteria from entering the uretro-genital area. It can also be helpful to take showers and avoid soaking in the bathtub.  Take a Vitamin C Supplement: About 1,000 milligrams of vitamin C taken daily can help inhibit the growth of some bacteria by acidifying the urine. Maintain Control with Cranberries: Cranberries contain hippuronic acid, which is a natural antiseptic that may help prevent the adherence of bacteria to the bladder lining. Drinking 100% pure cranberry juice or taking over the counter cranberry supplements twice daily may help to prevent an infection. However, it  is important to note that cranberry juices/supplements are not helpful once a urinary tract infection (UTI) is present. Strengthen Your Core: Often, a lazy bladder (unable to empty urine properly) occurs due to lower back problem, so consider doing exercises to help strengthen your back, pelvic floor, and stomach muscles.  Pay Attention to Your Urine: Your urine can change color for a variety of reasons, including from the medications you take, so pay close attention to it to monitor your overall health. One key thing to note is that if your urine is typically a darker yellow, your body is dehydrated, so you need to step up your water intake.

## 2015-12-30 ENCOUNTER — Encounter: Payer: Self-pay | Admitting: Urology

## 2015-12-30 ENCOUNTER — Ambulatory Visit (INDEPENDENT_AMBULATORY_CARE_PROVIDER_SITE_OTHER): Payer: Medicare Other | Admitting: Urology

## 2015-12-30 VITALS — BP 123/67 | HR 69 | Ht 68.0 in | Wt 172.4 lb

## 2015-12-30 DIAGNOSIS — N390 Urinary tract infection, site not specified: Secondary | ICD-10-CM | POA: Diagnosis not present

## 2015-12-30 DIAGNOSIS — R310 Gross hematuria: Secondary | ICD-10-CM

## 2015-12-30 LAB — URINALYSIS, COMPLETE
Bilirubin, UA: NEGATIVE
Glucose, UA: NEGATIVE
Ketones, UA: NEGATIVE
Nitrite, UA: NEGATIVE
Protein, UA: NEGATIVE
Specific Gravity, UA: 1.02 (ref 1.005–1.030)
Urobilinogen, Ur: 0.2 mg/dL (ref 0.2–1.0)
pH, UA: 6 (ref 5.0–7.5)

## 2015-12-30 LAB — MICROSCOPIC EXAMINATION: RBC, UA: NONE SEEN /hpf (ref 0–?)

## 2015-12-30 NOTE — Progress Notes (Signed)
12/30/2015 11:09 AM   Jessica Erickson 02-May-1926 [id number]  Referring provider: Jodi Marble, MD [address] [place], [place] [address]  Chief Complaint  Patient presents with  . Cysto    HPI: Gross hematuria occuring 1 week ago. She was seen in the ED and diagnosed a urinary tract infection. She was treated with a 10 day course of Keflex. No further gross hematuria. Urine culture was positive >100,000 E. Coli. No dysuria. Does notice urinary frequency in the morning after taking dose of Lasix. No fevers or flank pain.   Former smoker x 40years 1/2ppd.   History of 1 kidney stone episode in 1950's.   Former patient of Dr. Jacqlyn Larsen. History of recurrent urinary tract infections. S/p bladder mesh placement many years ago.  5 glasses of water per day.  No vaginal symptoms. Never used vaginal estrogen cream.   CT Renal Stone Study Mild bladder wall thickening could reflect cystitis. Dense calcifications suggested about the urethra.  January 2017 Interval History: The patient follows up for cystoscopy. However, her urinalysis concerning for infection. She is recently treated for an Escherichia coli UTI. She has had recurrent urinary tract infections. She was on Macrodantin 100 mg daily until October 2016. She stopped because her prescription ran out and her urologist had moved out of the area. She's had multiple urinary tract infections since coming off the medication. Her daughters requested that she be restarted on it. She denies changes to her urinary or gastrointestinal systems.   PMH: Past Medical History  Diagnosis Date  . History of kidney problems   . Diabetes ([place])   . Asthma   . Osteoarthritis   . Bilateral swelling of feet     and legs  . Neuropathy [facility])     Surgical History: Past Surgical History  Procedure Laterality Date  . Brain tumor surgery    . Thyroid surgery    . Throat surgery    . Cholecystectomy    . Peripheral vascular  catheterization N/A 10/11/2015    Procedure: IVC Filter Insertion;  Surgeon: Algernon Huxley, MD;  Location: [facility];  Service: Cardiovascular;  Laterality: N/A;    Home Medications:    Medication List       This list is accurate as of: 12/30/15 11:09 AM.  Always use your most recent med list.               acetaminophen 325 MG tablet  Commonly known as:  TYLENOL  Take 2 tablets (650 mg total) by mouth every 6 (six) hours as needed.     CALCIUM 600+D 600-400 MG-UNIT tablet  Generic drug:  Calcium Carbonate-Vitamin D  Take 1 tablet by mouth daily.     carbamazepine 200 MG tablet  Commonly known as:  TEGRETOL  Take 200 mg by mouth 2 (two) times daily.     cephALEXin 500 MG capsule  Commonly known as:  KEFLEX  Take 1 capsule (500 mg total) by mouth 2 (two) times daily.     cloNIDine 0.1 MG tablet  Commonly known as:  CATAPRES  Take 0.1 mg by mouth at bedtime.     enoxaparin 120 MG/0.8ML injection  Commonly known as:  LOVENOX  Inject 120 mg into the skin daily. Reported on 12/30/2015     fluticasone 50 MCG/ACT nasal spray  Commonly known as:  FLONASE  Place 1 spray into both nostrils daily.     fosinopril 10 MG tablet  Commonly known as:  MONOPRIL  Take 10 mg by mouth daily.     furosemide 20 MG tablet  Commonly known as:  LASIX  Take 20 mg by mouth daily.     LEVOXYL 150 MCG tablet  Generic drug:  levothyroxine  Take 150 mcg by mouth daily.     LIPITOR 40 MG tablet  Generic drug:  atorvastatin  Take 40 mg by mouth daily.     metFORMIN 500 MG tablet  Commonly known as:  GLUCOPHAGE  Take 500 mg by mouth daily with breakfast.     multivitamin with minerals Tabs tablet  Take 1 tablet by mouth daily.     omeprazole 40 MG capsule  Commonly known as:  PRILOSEC  Take 80 mg by mouth daily.     oxyCODONE 5 MG immediate release tablet  Commonly known as:  Oxy IR/ROXICODONE  Take 1 tablet (5 mg total) by mouth every 4 (four) hours as needed for severe  pain.     polyethylene glycol powder powder  Commonly known as:  GLYCOLAX/MIRALAX     tiotropium 18 MCG inhalation capsule  Commonly known as:  SPIRIVA  Place 18 mcg into inhaler and inhale daily.     warfarin 5 MG tablet  Commonly known as:  COUMADIN  Take 15 mg by mouth daily. Reported on 12/16/2015     warfarin 4 MG tablet  Commonly known as:  COUMADIN  Take 4 mg by mouth daily. Reported on 12/16/2015     warfarin 6 MG tablet  Commonly known as:  COUMADIN  Take 1 tablet (6 mg total) by mouth daily.        Allergies:  Allergies  Allergen Reactions  . Penicillins Shortness Of Breath, Rash and Other (See Comments)    Has patient had a PCN reaction causing immediate rash, facial/tongue/throat swelling, SOB or lightheadedness with hypotension: Yes Has patient had a PCN reaction causing severe rash involving mucus membranes or skin necrosis: No Has patient had a PCN reaction that required hospitalization No Has patient had a PCN reaction occurring within the last 10 years: No If all of the above answers are "NO", then may proceed with Cephalosporin use.  . Sulfa Antibiotics Shortness Of Breath and Rash  . Aspirin Other (See Comments)    Reaction:  Unknown     Family History: Family History  Problem Relation Age of Onset  . Diabetes Mother     Social History:  reports that she has never smoked. She has never used smokeless tobacco. She reports that she does not drink alcohol or use illicit drugs.  ROS: UROLOGY Frequent Urination?: Yes Hard to postpone urination?: Yes Burning/pain with urination?: No Get up at night to urinate?: Yes Leakage of urine?: No Urine stream starts and stops?: Yes Trouble starting stream?: No Do you have to strain to urinate?: Yes Blood in urine?: Yes Urinary tract infection?: Yes Sexually transmitted disease?: No Injury to kidneys or bladder?: No Painful intercourse?: No Weak stream?: No Currently pregnant?: No Vaginal bleeding?:  No Last menstrual period?: No  Gastrointestinal Nausea?: No Vomiting?: No Indigestion/heartburn?: No Diarrhea?: No Constipation?: No  Constitutional Fever: No Night sweats?: No Weight loss?: No Fatigue?: No  Skin Skin rash/lesions?: No Itching?: No  Eyes Blurred vision?: No Double vision?: No  Ears/Nose/Throat Sore throat?: No Sinus problems?: No  Hematologic/Lymphatic Swollen glands?: No Easy bruising?: Yes  Cardiovascular Leg swelling?: Yes Chest pain?: No  Respiratory Cough?: Yes Shortness of breath?: Yes  Endocrine Excessive thirst?: Yes  Musculoskeletal Back  pain?: Yes Joint pain?: No  Neurological Headaches?: Yes Dizziness?: Yes  Psychologic Depression?: No Anxiety?: No  Physical Exam: BP 123/67 mmHg  Pulse 69  Ht 5\' 8"  (1.727 m)  Wt 172 lb 6.4 oz (78.2 kg)  BMI 26.22 kg/m2  LMP  (LMP Unknown)  Constitutional:  Alert and oriented, No acute distress. HEENT: [place] AT, moist mucus membranes.  Trachea midline, no masses. Cardiovascular: No clubbing, cyanosis, or edema. Respiratory: Normal respiratory effort, no increased work of breathing. GI: Abdomen is soft, nontender, nondistended, no abdominal masses GU: No CVA tenderness.  Skin: No rashes, bruises or suspicious lesions. Lymph: No cervical or inguinal adenopathy. Neurologic: Grossly intact, no focal deficits, moving all 4 extremities. Psychiatric: Normal mood and affect.  Laboratory Data: Lab Results  Component Value Date   WBC 6.8 12/07/2015   HGB 11.7* 12/07/2015   HCT 35.6 12/07/2015   MCV 88.6 12/07/2015   PLT 221 12/07/2015    Lab Results  Component Value Date   CREATININE 0.94 12/07/2015    No results found for: PSA  No results found for: TESTOSTERONE  No results found for: HGBA1C  Urinalysis    Component Value Date/Time   COLORURINE RED* 12/07/2015 0003   APPEARANCEUR CLOUDY* 12/07/2015 0003   LABSPEC 1.015 12/07/2015 0003   PHURINE SEE COMMENTS 12/07/2015  0003   GLUCOSEU Negative 12/16/2015 1408   HGBUR SEE COMMENTS* 12/07/2015 0003   BILIRUBINUR Negative 12/16/2015 1408   BILIRUBINUR SEE COMMENTS* 12/07/2015 0003   KETONESUR SEE COMMENTS* 12/07/2015 0003   PROTEINUR SEE COMMENTS* 12/07/2015 0003   NITRITE Negative 12/16/2015 1408   NITRITE SEE COMMENTS* 12/07/2015 0003   LEUKOCYTESUR Negative 12/16/2015 1408   LEUKOCYTESUR SEE COMMENTS* 12/07/2015 0003      Assessment & Plan:    1. Gross Hematuria -CT urogram -Follow up after above for cystoscopy  2. Recurrent UTI -Will restart Macrodantin 100 mg daily -The patient has urinalysis suspicious for infection at this time with some symptoms. Will send for culture  3. Urethral calcifications- likely from previous mesh placement. Will further evaluate at the time of cystoscopy.  Return in about 2 weeks (around 01/13/2016) for after CT urogram for cystoscopy.  Nickie Retort, MD  [facility] [address], [place], [place] [address] [phone]

## 2016-01-01 LAB — CULTURE, URINE COMPREHENSIVE

## 2016-01-03 ENCOUNTER — Encounter: Payer: Self-pay | Admitting: Podiatry

## 2016-01-03 ENCOUNTER — Ambulatory Visit (INDEPENDENT_AMBULATORY_CARE_PROVIDER_SITE_OTHER): Payer: Medicare Other | Admitting: Podiatry

## 2016-01-03 VITALS — BP 99/57 | HR 76 | Resp 16

## 2016-01-03 DIAGNOSIS — M79676 Pain in unspecified toe(s): Secondary | ICD-10-CM | POA: Diagnosis not present

## 2016-01-03 DIAGNOSIS — B351 Tinea unguium: Secondary | ICD-10-CM | POA: Diagnosis not present

## 2016-01-03 DIAGNOSIS — L600 Ingrowing nail: Secondary | ICD-10-CM

## 2016-01-03 NOTE — Progress Notes (Signed)
She presents today chief complaint of ingrown nails of the hallux bilateral.  Objective: Vital signs are stable she is alert and oriented 3. Pulses are palpable. Current she is living in a nursing home because of a broken back.  Assessment: Pain and limp secondary onychomycosis.  Plan: Debridement of toenails 1 through 5 bilateral.

## 2016-01-10 ENCOUNTER — Ambulatory Visit
Admission: RE | Admit: 2016-01-10 | Discharge: 2016-01-10 | Disposition: A | Discharge status: Home / Self Care | Payer: Medicare Other | Source: Ambulatory Visit | Attending: Urology | Admitting: Urology

## 2016-01-10 DIAGNOSIS — Q638 Other specified congenital malformations of kidney: Secondary | ICD-10-CM | POA: Diagnosis not present

## 2016-01-10 DIAGNOSIS — R310 Gross hematuria: Secondary | ICD-10-CM

## 2016-01-10 DIAGNOSIS — N390 Urinary tract infection, site not specified: Secondary | ICD-10-CM | POA: Diagnosis present

## 2016-01-10 DIAGNOSIS — N329 Bladder disorder, unspecified: Secondary | ICD-10-CM | POA: Insufficient documentation

## 2016-01-10 MED ORDER — IOHEXOL 300 MG/ML  SOLN
125.0000 mL | Freq: Once | INTRAMUSCULAR | Status: AC | PRN
  Administered 2016-01-10: 125 mL via INTRAVENOUS

## 2016-01-19 ENCOUNTER — Other Ambulatory Visit: Payer: Medicare Other | Admitting: Urology

## 2016-01-19 ENCOUNTER — Telehealth: Payer: Self-pay | Admitting: Urology

## 2016-01-19 NOTE — Telephone Encounter (Signed)
Pt's daughter called and pt is sick with throwing up and diarrhea and needed to RS cysto from 2/15.  Got it RS to 3/1.  Ms. Jessica Erickson is a nurse and was wondering if someone could go ahead and call her with the results of the CT scan that Jessica Erickson had done so that they don't have to wait until 3/1 to get the results.  Please call pt's daughter.

## 2016-01-19 NOTE — Telephone Encounter (Signed)
Could you please confirm that the daughter is on the patient's disclosure form and get her phone number for me.  Hollice Espy, MD

## 2016-01-20 ENCOUNTER — Telehealth: Payer: Self-pay | Admitting: Urology

## 2016-01-20 NOTE — Telephone Encounter (Signed)
I called Mrs. Busa' number and her son answered (she was not home).  Again, I can't find the disclosure form but he states that he's her health care proxy.    If he is listed, can you please call and let them know that the CT scan looks basically fine (no obvious pathology) and we can review the details at her cysto.    Hollice Espy, MD

## 2016-01-20 NOTE — Telephone Encounter (Signed)
Called number provided for pt and [name], pt son, answered stating pt is not currently at the residence. Made [name] aware our office needs a copy of the records where he is health care proxy and pt to sign a form stating we can give the daughter healthcare information. [name] stated he would bring the papers by next week.

## 2016-01-20 NOTE — Telephone Encounter (Signed)
Thanks fine

## 2016-01-20 NOTE — Telephone Encounter (Signed)
We dont have any of that information in our system. I will call and see if they can bring that information to our office. At which point I will give results. Is that ok?

## 2016-01-20 NOTE — Telephone Encounter (Signed)
There is not release of information that allows [place] to speak to the pt daughter in the chart.

## 2016-02-02 ENCOUNTER — Ambulatory Visit (INDEPENDENT_AMBULATORY_CARE_PROVIDER_SITE_OTHER): Payer: Medicare Other | Admitting: Urology

## 2016-02-02 VITALS — BP 106/68 | HR 69 | Ht 68.0 in | Wt 171.0 lb

## 2016-02-02 DIAGNOSIS — N390 Urinary tract infection, site not specified: Secondary | ICD-10-CM | POA: Diagnosis not present

## 2016-02-02 DIAGNOSIS — R310 Gross hematuria: Secondary | ICD-10-CM | POA: Diagnosis not present

## 2016-02-02 MED ORDER — CIPROFLOXACIN HCL 500 MG PO TABS
500.0000 mg | ORAL_TABLET | Freq: Once | ORAL | Status: AC
  Administered 2016-02-02: 500 mg via ORAL

## 2016-02-02 MED ORDER — LIDOCAINE HCL 2 % EX GEL
1.0000 "application " | Freq: Once | CUTANEOUS | Status: AC
  Administered 2016-02-02: 1 via URETHRAL

## 2016-02-02 NOTE — Progress Notes (Signed)
3:34 PM  02/02/16  Jessica Erickson May 15, 1926 [id number]  Referring provider: Jodi Marble, MD [place], [place] [address]  Chief Complaint  Patient presents with  . Cysto    HPI: 82 yo F with visit of painless gross hematuria since today to follow-up CT urogram and for office cystoscopy.  CT urogram shows a curvilinear density at the bladder base as well as a spherical calcification which appears to be postsurgical related to implantation of mid urethral sling and bulking agent. She is also noted to have a partially duplicated right collecting system. There were otherwise no significant GU pathology noted on study performed on 01/10/2016.  She is recently been treated for several urinary tract infections, Escherichia coli. More recently, she was scheduled for cystoscopy but this was rescheduled due to active urinary tract infection. Since then, she is restarted on her daily Macrobid prophylaxis which she is taken in the remote past prescribing Dr. Jacqlyn Larsen.  Former smoker x 40years 1/2ppd.   History of 1 kidney stone episode in 1950's.     Former patient of Dr. Jacqlyn Larsen. History of recurrent urinary tract infections.  S/p bladder mesh placement many years ago along with history of urethral bulking agent.  5 glasses of water per day.  No vaginal symptoms. Never used vaginal estrogen cream.  .  PMH: Past Medical History  Diagnosis Date  . History of kidney problems   . Diabetes ([place])   . Asthma   . Osteoarthritis   . Bilateral swelling of feet     and legs  . Neuropathy [facility])     Surgical History: Past Surgical History  Procedure Laterality Date  . Brain tumor surgery    . Thyroid surgery    . Throat surgery    . Cholecystectomy    . Peripheral vascular catheterization N/A 10/11/2015    Procedure: IVC Filter Insertion;  Surgeon: Algernon Huxley, MD;  Location: [facility];  Service: Cardiovascular;  Laterality: N/A;    Home Medications:    Medication  List       This list is accurate as of: 02/02/16  3:34 PM.  Always use your most recent med list.               acetaminophen 325 MG tablet  Commonly known as:  TYLENOL  Take 2 tablets (650 mg total) by mouth every 6 (six) hours as needed.     CALCIUM 600+D 600-400 MG-UNIT tablet  Generic drug:  Calcium Carbonate-Vitamin D  Take 1 tablet by mouth daily.     carbamazepine 200 MG tablet  Commonly known as:  TEGRETOL  Take 200 mg by mouth 2 (two) times daily.     cephALEXin 500 MG capsule  Commonly known as:  KEFLEX  Take 1 capsule (500 mg total) by mouth 2 (two) times daily.     cloNIDine 0.1 MG tablet  Commonly known as:  CATAPRES  Take 0.1 mg by mouth at bedtime.     enoxaparin 120 MG/0.8ML injection  Commonly known as:  LOVENOX  Inject 120 mg into the skin daily. Reported on 12/30/2015     fluticasone 50 MCG/ACT nasal spray  Commonly known as:  FLONASE  Place 1 spray into both nostrils daily.     fosinopril 10 MG tablet  Commonly known as:  MONOPRIL  Take 10 mg by mouth daily.     furosemide 20 MG tablet  Commonly known as:  LASIX  Take 20 mg by mouth  daily.     LEVOXYL 150 MCG tablet  Generic drug:  levothyroxine  Take 150 mcg by mouth daily.     LIPITOR 40 MG tablet  Generic drug:  atorvastatin  Take 40 mg by mouth daily.     metFORMIN 500 MG tablet  Commonly known as:  GLUCOPHAGE  Take 500 mg by mouth daily with breakfast.     multivitamin with minerals Tabs tablet  Take 1 tablet by mouth daily.     nitrofurantoin 100 MG capsule  Commonly known as:  MACRODANTIN     omeprazole 40 MG capsule  Commonly known as:  PRILOSEC  Take 80 mg by mouth daily.     oxyCODONE 5 MG immediate release tablet  Commonly known as:  Oxy IR/ROXICODONE  Take 1 tablet (5 mg total) by mouth every 4 (four) hours as needed for severe pain.     polyethylene glycol powder powder  Commonly known as:  GLYCOLAX/MIRALAX     tiotropium 18 MCG inhalation capsule  Commonly known  as:  SPIRIVA  Place 18 mcg into inhaler and inhale daily.     warfarin 5 MG tablet  Commonly known as:  COUMADIN  Take 15 mg by mouth daily. Reported on 12/16/2015     warfarin 4 MG tablet  Commonly known as:  COUMADIN  Take 4 mg by mouth daily. Reported on 12/16/2015     warfarin 6 MG tablet  Commonly known as:  COUMADIN  Take 1 tablet (6 mg total) by mouth daily.        Allergies:  Allergies  Allergen Reactions  . Penicillins Shortness Of Breath, Rash and Other (See Comments)    Has patient had a PCN reaction causing immediate rash, facial/tongue/throat swelling, SOB or lightheadedness with hypotension: Yes Has patient had a PCN reaction causing severe rash involving mucus membranes or skin necrosis: No Has patient had a PCN reaction that required hospitalization No Has patient had a PCN reaction occurring within the last 10 years: No If all of the above answers are "NO", then may proceed with Cephalosporin use.  . Sulfa Antibiotics Shortness Of Breath and Rash  . Aspirin Other (See Comments)    Reaction:  Unknown     Family History: Family History  Problem Relation Age of Onset  . Diabetes Mother     Social History:  reports that she has never smoked. She has never used smokeless tobacco. She reports that she does not drink alcohol or use illicit drugs.  Physical Exam: BP 106/68 mmHg  Pulse 69  Ht 5\' 8"  (1.727 m)  Wt 171 lb (77.565 kg)  BMI 26.01 kg/m2  LMP  (LMP Unknown)  Constitutional:  Alert and oriented, No acute distress. HEENT: [place] AT, moist mucus membranes.  Trachea midline, no masses. Cardiovascular: No clubbing, cyanosis, or edema. Respiratory: Normal respiratory effort, no increased work of breathing. GI: Abdomen is soft, nontender, nondistended, no abdominal masses GU: No CVA tenderness.  Vaginal atrophy noted. Normal urethral meatus. Skin: No rashes, bruises or suspicious lesions. Neurologic: Grossly intact, no focal deficits, moving all 4  extremities. Psychiatric: Normal mood and affect.  Urinalysis Results for orders placed or performed in visit on 02/02/16  Microscopic Examination  Result Value Ref Range   WBC, UA None seen 0 -  5 /hpf   RBC, UA 0-2 0 -  2 /hpf   Epithelial Cells (non renal) None seen 0 - 10 /hpf   Bacteria, UA None seen None seen/Few  Urinalysis, Complete  Result Value Ref Range   Specific Gravity, UA 1.010 1.005 - 1.030   pH, UA 6.0 5.0 - 7.5   Color, UA Yellow Yellow   Appearance Ur Clear Clear   Leukocytes, UA Negative Negative   Protein, UA Negative Negative/Trace   Glucose, UA Negative Negative   Ketones, UA Negative Negative   RBC, UA Negative Negative   Bilirubin, UA Negative Negative   Urobilinogen, Ur 0.2 0.2 - 1.0 mg/dL   Nitrite, UA Negative Negative   Microscopic Examination See below:     Pertinent Imaging: Study Result     CLINICAL DATA: Urinary tract infection and gross hematuria 1 month ago. On anti coagulation. History of stress incontinence.  EXAM: CT ABDOMEN AND PELVIS WITHOUT AND WITH CONTRAST  TECHNIQUE: Multidetector CT imaging of the abdomen and pelvis was performed following the standard protocol before and following the bolus administration of intravenous contrast.  CONTRAST: 175mL OMNIPAQUE IOHEXOL 300 MG/ML SOLN  COMPARISON: Stone study 12/07/2015.  FINDINGS: Lower chest: Clear lung bases. Mild cardiomegaly with multivessel coronary artery atherosclerosis. Small hiatal hernia. Fluid level in the lower esophagus, including on image 10 of series 2.  Hepatobiliary: Normal liver. Cholecystectomy, without biliary ductal dilatation.  Pancreas: Normal pancreas for age, without duct dilatation or mass.  Spleen: Normal in size, without focal abnormality.  Adrenals/Urinary Tract: Normal adrenal glands. No renal calculi or hydronephrosis. No hydroureter or ureteric calculi. No bladder calculi. Upper pole right renal cortical  thinning/scarring. At least partially duplicated right renal collecting system, with prominence of the lower pole right ureteric moiety, mild. Example image 35/series 7. No obstructive mass or stone identified. No enhancing bladder mass. Increased density within the inferior aspect of the urinary bladder could be postprocedure related in this patient history of stress urinary incontinence. This has been present back to 09/15/2015.  Moderate renal collecting system opacification on delayed images. Suboptimal distal ureteric opacification bilaterally. No filling defect identified. No bladder filling defect identified.  Stomach/Bowel: Normal distal stomach. Normal large and small bowel loops.  Vascular/Lymphatic: Advanced aortic and branch vessel atherosclerosis. An IVC filter which is appropriately positioned. No abdominopelvic adenopathy.  Reproductive: Normal uterus and adnexa.  Other: No significant free fluid.  Musculoskeletal: Vertebral augmentation at T12 secondary to a moderate underlying compression deformity with ventral canal encroachment. Moderate lumbar spondylosis/degenerative disc disease.  IMPRESSION: 1. No acute process or explanation for hematuria. 2. Curvilinear increased density at the bladder base and junction with the urethra. Favored to be postoperative/procedure related. Dystrophic calcification or an atypical appearance of bladder stone felt less likely. Correlate with surgical history. 3. At least partially duplicated right renal collecting system with prominence of the lower pole right renal collecting system, similar. No obstructive cause identified. 4. Esophageal air fluid level suggests dysmotility or gastroesophageal reflux.   Electronically Signed  By: Abigail Miyamoto M.D.  On: 01/10/2016 15:20     Cystoscopy Procedure Note  Patient identification was confirmed, informed consent was obtained, and patient was prepped using Betadine  solution.  Lidocaine jelly was administered per urethral meatus.    Preoperative abx where received prior to procedure.    Procedure: - Flexible cystoscope introduced, without any difficulty.   - Thorough search of the bladder revealed:    normal urethral meatus although there was significant submucosal mass effect at the 10:00 position within the mid and proximal urethra consistent with previous injection of transurethral bulking agent    normal urothelium    no stones    no ulcers  no tumors    no urethral polyps    no trabeculation  - Ureteral orifices were normal in position and appearance (right stadium appearance UO)  Post-Procedure: - Patient tolerated the procedure well   Assessment & Plan:    1. Gross Hematuria-  No obvious findings or expirations for gross hematuria. Suspect likely related to cystitis in the setting of anticoagulation.  2.  Recurrent UTI-  UTI prevention strategies previously discussed.  Continued daily ppx macrobid until script runs out then will reassess.  3. Urethral calcifications- post surgical.  No pathology on cysto.   Return if symptoms worsen or fail to improve.  Hollice Espy, MD  [facility] [address], [place], [place] [address] [phone]

## 2016-02-03 LAB — URINALYSIS, COMPLETE
Bilirubin, UA: NEGATIVE
Glucose, UA: NEGATIVE
Ketones, UA: NEGATIVE
Leukocytes, UA: NEGATIVE
Nitrite, UA: NEGATIVE
Protein, UA: NEGATIVE
RBC, UA: NEGATIVE
Specific Gravity, UA: 1.01 (ref 1.005–1.030)
Urobilinogen, Ur: 0.2 mg/dL (ref 0.2–1.0)
pH, UA: 6 (ref 5.0–7.5)

## 2016-02-03 LAB — MICROSCOPIC EXAMINATION
Bacteria, UA: NONE SEEN
Epithelial Cells (non renal): NONE SEEN /hpf (ref 0–10)
WBC, UA: NONE SEEN /hpf (ref 0–?)

## 2016-04-03 ENCOUNTER — Ambulatory Visit (INDEPENDENT_AMBULATORY_CARE_PROVIDER_SITE_OTHER): Payer: Medicare Other | Admitting: Podiatry

## 2016-04-03 ENCOUNTER — Encounter: Payer: Self-pay | Admitting: Podiatry

## 2016-04-03 DIAGNOSIS — M79676 Pain in unspecified toe(s): Secondary | ICD-10-CM | POA: Diagnosis not present

## 2016-04-03 DIAGNOSIS — B351 Tinea unguium: Secondary | ICD-10-CM

## 2016-04-03 NOTE — Progress Notes (Signed)
She presents today with chief complaint of diabetes. She is also concerned about painful elongated toenails bilateral.  Objective: Vital signs are stable she is alert and oriented 3. Pulses are palpable bilateral. Neurologic sensorium is intact. Deep tendon reflexes are intact. Toenails are thick yellow dystrophic with mycotic and painful palpation.  Assessment: Pain in limb secondary to onychomycosis.  Plan: Debridement of toenails 1 through 5 bilateral. Follow up with her in 2-3 months.

## 2016-07-05 ENCOUNTER — Ambulatory Visit: Payer: Medicare Other | Admitting: Podiatry

## 2016-07-07 ENCOUNTER — Encounter: Payer: Self-pay | Admitting: Internal Medicine

## 2016-07-19 ENCOUNTER — Encounter: Payer: Self-pay | Admitting: Podiatry

## 2016-07-19 ENCOUNTER — Ambulatory Visit (INDEPENDENT_AMBULATORY_CARE_PROVIDER_SITE_OTHER): Payer: Medicare Other | Admitting: Podiatry

## 2016-07-19 DIAGNOSIS — B351 Tinea unguium: Secondary | ICD-10-CM

## 2016-07-19 DIAGNOSIS — M79676 Pain in unspecified toe(s): Secondary | ICD-10-CM | POA: Diagnosis not present

## 2016-07-19 NOTE — Progress Notes (Signed)
She presents today chief complaint of painful elongated toenails.  Objective: Pulses remain palpable no open lesions or wounds noted. Toenails are thick yellow dystrophic clinic with mycotic and painful palpation as well as debridement.  Assessment: Pain in limb secondary to onychomycosis and diabetes.  Plan: Debridement of toenails 1 through 5 bilateral.

## 2016-09-20 ENCOUNTER — Ambulatory Visit (INDEPENDENT_AMBULATORY_CARE_PROVIDER_SITE_OTHER): Payer: Medicare Other | Admitting: Podiatry

## 2016-09-20 ENCOUNTER — Encounter: Payer: Self-pay | Admitting: Podiatry

## 2016-09-20 DIAGNOSIS — B351 Tinea unguium: Secondary | ICD-10-CM

## 2016-09-20 DIAGNOSIS — M79676 Pain in unspecified toe(s): Secondary | ICD-10-CM | POA: Diagnosis not present

## 2016-09-20 DIAGNOSIS — L600 Ingrowing nail: Secondary | ICD-10-CM

## 2016-09-20 NOTE — Progress Notes (Signed)
She presents today chief complaint of painful toenails bilateral.  Objective: Vital signs are stable slurring 3 pulses are palpable. Radial nail margins the [facility] clinic mycotic nails.  Assessment: Patient with significant onychomycosis and onychocryptosis.  Plan: Debridement of toenails 1 through 5 bilateral.

## 2016-10-23 ENCOUNTER — Ambulatory Visit: Payer: Medicare Other | Admitting: Podiatry

## 2016-12-20 ENCOUNTER — Ambulatory Visit: Payer: Medicare Other | Admitting: Podiatry

## 2017-01-01 ENCOUNTER — Ambulatory Visit (INDEPENDENT_AMBULATORY_CARE_PROVIDER_SITE_OTHER): Payer: Medicare Other | Admitting: Podiatry

## 2017-01-01 ENCOUNTER — Encounter: Payer: Self-pay | Admitting: Podiatry

## 2017-01-01 DIAGNOSIS — B351 Tinea unguium: Secondary | ICD-10-CM

## 2017-01-01 DIAGNOSIS — M79676 Pain in unspecified toe(s): Secondary | ICD-10-CM

## 2017-01-01 NOTE — Progress Notes (Signed)
She presents today to complaint of painful elongated toenails. She states that her toes feel better since she is wearing longer shoes.  Objective: Vital signs are stable alert and oriented 3. Pulses are palpable. It is along thick yellow dystrophic onychomycotic.  Assessment: Pain and limps a onychomycosis.  Plan: Debridement of toenails thickness and length follow up with me as needed.

## 2017-04-02 ENCOUNTER — Encounter: Payer: Self-pay | Admitting: Podiatry

## 2017-04-02 ENCOUNTER — Ambulatory Visit (INDEPENDENT_AMBULATORY_CARE_PROVIDER_SITE_OTHER): Payer: Medicare Other | Admitting: Podiatry

## 2017-04-02 DIAGNOSIS — B351 Tinea unguium: Secondary | ICD-10-CM

## 2017-04-02 DIAGNOSIS — M79676 Pain in unspecified toe(s): Secondary | ICD-10-CM

## 2017-04-02 NOTE — Progress Notes (Signed)
She presents today for diabetic foot care. She is complaining of painful elongated toenails.  Gen. evaluation reveals vital signs are stable N 3. Pulses are palpable. No erythema and he was a lesser injury to her tennis along the talar dystrophic onychomycotic.  Assessment: Pain in limb segment onychomycosis.  Plan: Debridement of toenails 1 through 5 bilateral.

## 2017-05-03 ENCOUNTER — Ambulatory Visit
Admission: RE | Admit: 2017-05-03 | Discharge: 2017-05-03 | Disposition: A | Discharge status: Home / Self Care | Payer: Medicare Other | Source: Ambulatory Visit | Attending: Internal Medicine | Admitting: Internal Medicine

## 2017-05-03 ENCOUNTER — Other Ambulatory Visit: Payer: Self-pay | Admitting: Internal Medicine

## 2017-05-03 DIAGNOSIS — M79605 Pain in left leg: Secondary | ICD-10-CM | POA: Insufficient documentation

## 2017-05-10 ENCOUNTER — Ambulatory Visit: Payer: Medicare PPO

## 2017-05-28 ENCOUNTER — Encounter: Payer: Self-pay | Admitting: Internal Medicine

## 2017-06-04 ENCOUNTER — Ambulatory Visit: Payer: Medicare Other | Admitting: Podiatry

## 2017-06-18 ENCOUNTER — Ambulatory Visit (INDEPENDENT_AMBULATORY_CARE_PROVIDER_SITE_OTHER): Payer: Medicare Other | Admitting: Podiatry

## 2017-06-18 DIAGNOSIS — M79676 Pain in unspecified toe(s): Secondary | ICD-10-CM

## 2017-06-18 DIAGNOSIS — B351 Tinea unguium: Secondary | ICD-10-CM

## 2017-06-18 NOTE — Progress Notes (Signed)
She presents today chief complaint of painful elongated toenails.  Objective: Toenails are long thick yellow dystrophic mycotic no open lesions or wounds are noted.  Assessment: Pain in limb secondary to onychomycosis.  Plan: Debridement toenails 1 through 5 bilateral.

## 2017-08-20 ENCOUNTER — Encounter: Payer: Self-pay | Admitting: Podiatry

## 2017-08-20 ENCOUNTER — Ambulatory Visit (INDEPENDENT_AMBULATORY_CARE_PROVIDER_SITE_OTHER): Payer: Medicare Other | Admitting: Podiatry

## 2017-08-20 DIAGNOSIS — M79676 Pain in unspecified toe(s): Secondary | ICD-10-CM | POA: Diagnosis not present

## 2017-08-20 DIAGNOSIS — B351 Tinea unguium: Secondary | ICD-10-CM

## 2017-08-20 NOTE — Progress Notes (Signed)
She presents today chief complaint of painful elongated toenails 1 through 5 bilateral foot.  Objective: Pulses are palpable calf is supple and nontender. No open lesions or wounds. Toenails are long thick yellow dystrophic onychomycotic and sharply incurvated resulting in pain.  Assessment: Pain in limb secondary to onychomycosis.  Plan: Debridement of toenails 1 through 5 bilateral.

## 2017-10-18 ENCOUNTER — Ambulatory Visit: Payer: Medicare Other | Admitting: Internal Medicine

## 2017-10-22 ENCOUNTER — Encounter: Payer: Self-pay | Admitting: Podiatry

## 2017-10-22 ENCOUNTER — Ambulatory Visit (INDEPENDENT_AMBULATORY_CARE_PROVIDER_SITE_OTHER): Payer: Medicare Other | Admitting: Podiatry

## 2017-10-22 DIAGNOSIS — M79676 Pain in unspecified toe(s): Secondary | ICD-10-CM | POA: Diagnosis not present

## 2017-10-22 DIAGNOSIS — B351 Tinea unguium: Secondary | ICD-10-CM | POA: Diagnosis not present

## 2017-10-22 NOTE — Progress Notes (Signed)
She presents today chief complaint of painful elongated toenails.  Objective: Toenails are long thick yellow dystrophic clinically mycotic painful on palpation as was debridement.  Assessment: Pain and limb secondary to onychomycosis.  Plan: Debridement toenails 1 through 5 bilateral.

## 2017-11-12 ENCOUNTER — Ambulatory Visit: Payer: Medicare Other | Admitting: Internal Medicine

## 2017-12-05 ENCOUNTER — Telehealth: Payer: Self-pay | Admitting: Internal Medicine

## 2017-12-05 NOTE — Telephone Encounter (Signed)
Attempted to call pt to discuss current symptoms. Phone did ring, but when phone was picked up no one answered. Pt would need to make appt for symptoms to be further evaluated.

## 2017-12-05 NOTE — Telephone Encounter (Signed)
Copied from [place] [id number]. Topic: Quick Communication - See Telephone Encounter >> Dec 05, 2017  1:24 PM Corie Chiquito, [place] wrote: CRM for notification. See Telephone encounter for: Becky patients home care giver called because the patient thinks that she might have an UTI and would like to know if her doctor could call her in an antibiotic to the [facility] and they can be reached at [phone]. Patient does have an appointment to be seen on 12-10-2017 @ 2pm. If someone could give them a call back at [phone]  12/05/17.

## 2017-12-10 ENCOUNTER — Ambulatory Visit: Payer: Medicare Other | Admitting: Internal Medicine

## 2017-12-10 ENCOUNTER — Encounter: Payer: Self-pay | Admitting: Internal Medicine

## 2017-12-10 VITALS — BP 126/88 | HR 95 | Temp 98.3°F | Resp 18 | Ht 64.75 in | Wt 196.2 lb

## 2017-12-10 DIAGNOSIS — N3000 Acute cystitis without hematuria: Secondary | ICD-10-CM

## 2017-12-10 DIAGNOSIS — G4700 Insomnia, unspecified: Secondary | ICD-10-CM

## 2017-12-10 DIAGNOSIS — F419 Anxiety disorder, unspecified: Secondary | ICD-10-CM

## 2017-12-10 DIAGNOSIS — E039 Hypothyroidism, unspecified: Secondary | ICD-10-CM

## 2017-12-10 DIAGNOSIS — G500 Trigeminal neuralgia: Secondary | ICD-10-CM

## 2017-12-10 DIAGNOSIS — I10 Essential (primary) hypertension: Secondary | ICD-10-CM

## 2017-12-10 DIAGNOSIS — K219 Gastro-esophageal reflux disease without esophagitis: Secondary | ICD-10-CM | POA: Diagnosis not present

## 2017-12-10 DIAGNOSIS — F329 Major depressive disorder, single episode, unspecified: Secondary | ICD-10-CM | POA: Diagnosis not present

## 2017-12-10 DIAGNOSIS — K5900 Constipation, unspecified: Secondary | ICD-10-CM | POA: Diagnosis not present

## 2017-12-10 DIAGNOSIS — F32A Depression, unspecified: Secondary | ICD-10-CM

## 2017-12-10 DIAGNOSIS — H04129 Dry eye syndrome of unspecified lacrimal gland: Secondary | ICD-10-CM | POA: Diagnosis not present

## 2017-12-10 DIAGNOSIS — E119 Type 2 diabetes mellitus without complications: Secondary | ICD-10-CM

## 2017-12-10 DIAGNOSIS — J449 Chronic obstructive pulmonary disease, unspecified: Secondary | ICD-10-CM | POA: Diagnosis not present

## 2017-12-10 MED ORDER — PANTOPRAZOLE SODIUM 40 MG PO TBEC: 40.0000 mg | DELAYED_RELEASE_TABLET | Freq: Every day | ORAL | 1 refills | Status: DC

## 2017-12-10 MED ORDER — ACETAMINOPHEN-CODEINE #3 300-30 MG PO TABS: 1.0000 | ORAL_TABLET | Freq: Every day | ORAL | 0 refills | Status: DC | PRN

## 2017-12-10 NOTE — Patient Instructions (Addendum)
Try Melatonin 3-5 mg 1 hour before bed for sleep or warm milk  Follow up in 2-3 weeks  Schedule fasting labs in 1 week no food only water x 12 hours  I will call pharmacy and confirm your medications to be sure   Insomnia Insomnia is a sleep disorder that makes it difficult to fall asleep or to stay asleep. Insomnia can cause tiredness (fatigue), low energy, difficulty concentrating, mood swings, and poor performance at work or school. There are three different ways to classify insomnia:  Difficulty falling asleep.  Difficulty staying asleep.  Waking up too early in the morning.  Any type of insomnia can be long-term (chronic) or short-term (acute). Both are common. Short-term insomnia usually lasts for three months or less. Chronic insomnia occurs at least three times a week for longer than three months. What are the causes? Insomnia may be caused by another condition, situation, or substance, such as:  Anxiety.  Certain medicines.  Gastroesophageal reflux disease (GERD) or other gastrointestinal conditions.  Asthma or other breathing conditions.  Restless legs syndrome, sleep apnea, or other sleep disorders.  Chronic pain.  Menopause. This may include hot flashes.  Stroke.  Abuse of alcohol, tobacco, or illegal drugs.  Depression.  Caffeine.  Neurological disorders, such as Alzheimer disease.  An overactive thyroid (hyperthyroidism).  The cause of insomnia may not be known. What increases the risk? Risk factors for insomnia include:  Gender. Women are more commonly affected than men.  Age. Insomnia is more common as you get older.  Stress. This may involve your professional or personal life.  Income. Insomnia is more common in people with lower income.  Lack of exercise.  Irregular work schedule or night shifts.  Traveling between different time zones.  What are the signs or symptoms? If you have insomnia, trouble falling asleep or trouble staying  asleep is the main symptom. This may lead to other symptoms, such as:  Feeling fatigued.  Feeling nervous about going to sleep.  Not feeling rested in the morning.  Having trouble concentrating.  Feeling irritable, anxious, or depressed.  How is this treated? Treatment for insomnia depends on the cause. If your insomnia is caused by an underlying condition, treatment will focus on addressing the condition. Treatment may also include:  Medicines to help you sleep.  Counseling or therapy.  Lifestyle adjustments.  Follow these instructions at home:  Take medicines only as directed by your health care provider.  Keep regular sleeping and waking hours. Avoid naps.  Keep a sleep diary to help you and your health care provider figure out what could be causing your insomnia. Include: ? When you sleep. ? When you wake up during the night. ? How well you sleep. ? How rested you feel the next day. ? Any side effects of medicines you are taking. ? What you eat and drink.  Make your bedroom a comfortable place where it is easy to fall asleep: ? Put up shades or special blackout curtains to block light from outside. ? Use a white noise machine to block noise. ? Keep the temperature cool.  Exercise regularly as directed by your health care provider. Avoid exercising right before bedtime.  Use relaxation techniques to manage stress. Ask your health care provider to suggest some techniques that may work well for you. These may include: ? Breathing exercises. ? Routines to release muscle tension. ? Visualizing peaceful scenes.  Cut back on alcohol, caffeinated beverages, and cigarettes, especially close to bedtime. These  can disrupt your sleep.  Do not overeat or eat spicy foods right before bedtime. This can lead to digestive discomfort that can make it hard for you to sleep.  Limit screen use before bedtime. This includes: ? Watching TV. ? Using your smartphone, tablet, and  computer.  Stick to a routine. This can help you fall asleep faster. Try to do a quiet activity, brush your teeth, and go to bed at the same time each night.  Get out of bed if you are still awake after 15 minutes of trying to sleep. Keep the lights down, but try reading or doing a quiet activity. When you feel sleepy, go back to bed.  Make sure that you drive carefully. Avoid driving if you feel very sleepy.  Keep all follow-up appointments as directed by your health care provider. This is important. Contact a health care provider if:  You are tired throughout the day or have trouble in your daily routine due to sleepiness.  You continue to have sleep problems or your sleep problems get worse. Get help right away if:  You have serious thoughts about hurting yourself or someone else. This information is not intended to replace advice given to you by your health care provider. Make sure you discuss any questions you have with your health care provider. Document Released: 11/17/2000 Document Revised: 04/21/2016 Document Reviewed: 08/21/2014 Elsevier Interactive Patient Education  2018 [employer].  Trigeminal Neuralgia Trigeminal neuralgia is a nerve disorder that causes attacks of severe facial pain. The attacks last from a few seconds to several minutes. They can happen for days, weeks, or months and then go away for months or years. Trigeminal neuralgia is also called tic douloureux. What are the causes? This condition is caused by damage to a nerve in the face that is called the trigeminal nerve. An attack can be triggered by:  Talking.  Chewing.  Putting on makeup.  Washing your face.  Shaving your face.  Brushing your teeth.  Touching your face.  What increases the risk? This condition is more likely to develop in:  Women.  People who are 98 years of age or older.  What are the signs or symptoms? The main symptom of this condition is pain in the jaw, lips, eyes,  nose, scalp, forehead, and face. The pain may be intense, stabbing, electric, or shock-like. How is this diagnosed? This condition is diagnosed with a physical exam. A CT scan or MRI may be done to rule out other conditions that can cause facial pain. How is this treated? This condition may be treated with:  Avoiding the things that trigger your attacks.  Pain medicine.  Surgery. This may be done in severe cases if other medical treatment does not provide relief.  Follow these instructions at home:  Take over-the-counter and prescription medicines only as told by your health care provider.  If you wish to get pregnant, talk with your health care provider before you start trying to get pregnant.  Avoid the things that trigger your attacks. It may help to: ? Chew on the unaffected side of your mouth. ? Avoid touching your face. ? Avoid blasts of hot or cold air. Contact a health care provider if:  Your pain medicine is not helping.  You develop new, unexplained symptoms, such as: ? Double vision. ? Facial weakness. ? Changes in hearing or balance.  You become pregnant. Get help right away if:  Your pain is unbearable, and your pain medicine does not  help. This information is not intended to replace advice given to you by your health care provider. Make sure you discuss any questions you have with your health care provider. Document Released: 11/17/2000 Document Revised: 07/23/2016 Document Reviewed: 03/15/2015 Elsevier Interactive Patient Education  [employer].

## 2017-12-13 ENCOUNTER — Ambulatory Visit: Payer: Medicare Other | Admitting: Internal Medicine

## 2017-12-13 ENCOUNTER — Encounter: Payer: Self-pay | Admitting: Internal Medicine

## 2017-12-13 DIAGNOSIS — K5900 Constipation, unspecified: Secondary | ICD-10-CM | POA: Insufficient documentation

## 2017-12-13 DIAGNOSIS — E1122 Type 2 diabetes mellitus with diabetic chronic kidney disease: Secondary | ICD-10-CM | POA: Insufficient documentation

## 2017-12-13 DIAGNOSIS — Z0289 Encounter for other administrative examinations: Secondary | ICD-10-CM

## 2017-12-13 DIAGNOSIS — F32A Depression, unspecified: Secondary | ICD-10-CM | POA: Insufficient documentation

## 2017-12-13 DIAGNOSIS — J449 Chronic obstructive pulmonary disease, unspecified: Secondary | ICD-10-CM | POA: Insufficient documentation

## 2017-12-13 DIAGNOSIS — F329 Major depressive disorder, single episode, unspecified: Secondary | ICD-10-CM | POA: Insufficient documentation

## 2017-12-13 DIAGNOSIS — E039 Hypothyroidism, unspecified: Secondary | ICD-10-CM | POA: Insufficient documentation

## 2017-12-13 DIAGNOSIS — G500 Trigeminal neuralgia: Secondary | ICD-10-CM | POA: Insufficient documentation

## 2017-12-13 DIAGNOSIS — F419 Anxiety disorder, unspecified: Secondary | ICD-10-CM | POA: Insufficient documentation

## 2017-12-13 DIAGNOSIS — E119 Type 2 diabetes mellitus without complications: Secondary | ICD-10-CM | POA: Insufficient documentation

## 2017-12-13 DIAGNOSIS — N1832 Chronic kidney disease, stage 3b: Secondary | ICD-10-CM | POA: Insufficient documentation

## 2017-12-13 DIAGNOSIS — G4700 Insomnia, unspecified: Secondary | ICD-10-CM | POA: Insufficient documentation

## 2017-12-13 DIAGNOSIS — K219 Gastro-esophageal reflux disease without esophagitis: Secondary | ICD-10-CM | POA: Insufficient documentation

## 2017-12-13 LAB — POCT CBG (FASTING - GLUCOSE)-MANUAL ENTRY: Glucose Fasting, POC: 126 mg/dL — AB (ref 70–99)

## 2017-12-13 NOTE — Progress Notes (Addendum)
Chief Complaint  Patient presents with  . Follow-up   Establish and f/u with caretaker and son [name] 1. List of questions with family see A&P 2. Recent UTI 12/06/17 saw [facility] pending culture results she was put on macrobid x 10 days will f/u 12/13/17 with me  3. Trigeminal neuralgia right cheek pain worse after getting teeth pulled 09/2017 8/10 at times . Has Tylenol #3 which she takes and helps and also appt with Dr. Tomi Bamberger she has had 2 gamma knifes to help and on Tegretol max dose that MD and family wants to do  4. Family c/w anxiety/depression and was on Imipramine 25 mg qd in the past 5. ADLS reduced bike riding was doing qd but now not as much per family but per pt at least 3x per week  6.Needs refill Protonix  7. Daughter wants to know and shingles vaccines    Review of Systems  Constitutional: Negative for weight loss.  HENT: Positive for hearing loss.        +right trigeminal neuralgia   Respiratory: Negative for shortness of breath and wheezing.   Cardiovascular: Negative for chest pain.  Gastrointestinal: Negative for abdominal pain.  Genitourinary:       +UTI  Musculoskeletal: Negative for falls.  Skin: Negative for rash.  Neurological:       +right trigeminal neuralgia   Psychiatric/Behavioral: Positive for depression. Negative for memory loss. The patient is nervous/anxious.    Past Medical History:  Diagnosis Date  . Asthma   . Bilateral swelling of feet    and legs  . CAD (coronary artery disease)   . Compression fracture of body of thoracic vertebra (HCC)    T12 09/18/15 MRI s/p fall   . Constipation   . COPD (chronic obstructive pulmonary disease) ([place])   . Diabetes ([place])    with neuropathy  . DVT (deep venous thrombosis) ([place])    right leg 10/2015 was on coumadin off as of 2017/2018 ; s/p IVC filter  . Fatty liver    09/15/15 also mildly dilated pancreatitic duct rec MRCP small sub cm cyst hemangioma speeln mild right hydronephrorossi and prox.  hydroureter, kidney stones, mild scarring kidneys  . GERD (gastroesophageal reflux disease)    with small hiatal hernia   . Hard of hearing   . History of kidney problems   . Hyperlipidemia   . Hypertension   . Impaired mobility and ADLs    uses rolling walker has caretaker 24/7 at home  . Leg edema   . Neuropathy   . Osteoarthritis    DDD spine   . Pulmonary embolism ([place])    10/2015 off coumadin as of 2017/2018   . Pulmonary HTN (HCC)    mild pulm HTN, echo 10/09/15 EF 14-43%XVQMG 1 dd, RV systolic pressure increased   . Recurrent UTI   . Recurrent UTI   . Thyroid disease    follows [facility]  . TIA (transient ischemic attack)    MRI 2009/2010 neg stroke   . Trigeminal neuralgia    Dr. Tomi Bamberger s/p gamma knife x 2, on Tegretol since 2011/2012 no increase in dose >200 mg bid rec per family per neurology   . Urinary, incontinence, stress female    Dr Erlene Quan urology    Past Surgical History:  Procedure Laterality Date  . brain tumor surgery    . CHOLECYSTECTOMY    . PERIPHERAL VASCULAR CATHETERIZATION N/A 10/11/2015   Procedure: IVC Filter Insertion;  Surgeon: Erskine Squibb  Lucky Cowboy, MD;  Location: [facility];  Service: Cardiovascular;  Laterality: N/A;  . THROAT SURGERY    . THYROID SURGERY     Family History  Problem Relation Age of Onset  . Diabetes Mother    Social History   Socioeconomic History  . Marital status: Widowed    Spouse name: Not on file  . Number of children: Not on file  . Years of education: Not on file  . Highest education level: Not on file  Social Needs  . Financial resource strain: Not on file  . Food insecurity - worry: Not on file  . Food insecurity - inability: Not on file  . Transportation needs - medical: Not on file  . Transportation needs - non-medical: Not on file  Occupational History  . Not on file  Tobacco Use  . Smoking status: Never Smoker  . Smokeless tobacco: Never Used  Substance and Sexual Activity  . Alcohol  use: No  . Drug use: No  . Sexual activity: Not on file  Other Topics Concern  . Not on file  Social History Narrative  . Not on file   Current Meds  Medication Sig  . acetaminophen (TYLENOL) 325 MG tablet Take 2 tablets (650 mg total) by mouth every 6 (six) hours as needed.  Marland Kitchen acetaminophen-codeine (TYLENOL #3) 300-30 MG tablet   . aspirin EC 81 MG tablet Take by mouth.  Marland Kitchen atorvastatin (LIPITOR) 20 MG tablet Take by mouth.  . Calcium Carb-Cholecalciferol (CALCIUM CARBONATE-VITAMIN D3 PO) Take by mouth.  . Calcium Carbonate-Vitamin D (CALCIUM 600+D) 600-400 MG-UNIT tablet Take 1 tablet by mouth daily.  . carbamazepine (TEGRETOL) 200 MG tablet Take 200 mg by mouth 2 (two) times daily.  . cetirizine (ZYRTEC) 10 MG tablet Take by mouth.  . fluticasone (FLONASE) 50 MCG/ACT nasal spray Place 1 spray into both nostrils daily.  . fosinopril (MONOPRIL) 10 MG tablet Take 10 mg by mouth daily.  . furosemide (LASIX) 20 MG tablet Take 20 mg by mouth daily.  Marland Kitchen guaiFENesin (ROBITUSSIN) 100 MG/5ML liquid Take 200 mg by mouth daily.  Marland Kitchen levothyroxine (SYNTHROID, LEVOTHROID) 200 MCG tablet Take 200 mcg by mouth daily before breakfast.  . metFORMIN (GLUCOPHAGE) 500 MG tablet Take 500 mg by mouth daily with breakfast.   . Multiple Vitamin (MULTIVITAMIN WITH MINERALS) TABS tablet Take 1 tablet by mouth daily.  . nitrofurantoin (MACRODANTIN) 100 MG capsule   . pantoprazole (PROTONIX) 40 MG tablet Take 1 tablet (40 mg total) by mouth daily.  Vladimir Faster Glycol-Propyl Glycol 0.4-0.3 % SOLN Apply to eye at bedtime.  . polyethylene glycol powder (GLYCOLAX/MIRALAX) powder   . tiotropium (SPIRIVA) 18 MCG inhalation capsule Place 18 mcg into inhaler and inhale daily.  . Tiotropium Bromide Monohydrate 1.25 MCG/ACT AERS Inhale into the lungs.  . [DISCONTINUED] levothyroxine (LEVOXYL) 150 MCG tablet Take 150 mcg by mouth daily.   . [DISCONTINUED] pantoprazole (PROTONIX) 40 MG tablet Take 40 mg by mouth daily.    Allergies  Allergen Reactions  . Penicillins Shortness Of Breath, Rash and Other (See Comments)    Has patient had a PCN reaction causing immediate rash, facial/tongue/throat swelling, SOB or lightheadedness with hypotension: Yes Has patient had a PCN reaction causing severe rash involving mucus membranes or skin necrosis: No Has patient had a PCN reaction that required hospitalization No Has patient had a PCN reaction occurring within the last 10 years: No If all of the above answers are "NO", then may proceed with Cephalosporin  use.  . Sulfa Antibiotics Shortness Of Breath, Rash and Other (See Comments)  . Amitiza [Lubiprostone]     N/v/d  . Aspirin Other (See Comments)    Reaction:  Unknown   . Penicillin G Other (See Comments)   No results found for this or any previous visit (from the past 2160 hour(s)). Objective  Body mass index is 32.91 kg/m. Wt Readings from Last 3 Encounters:  12/10/17 196 lb 4 oz (89 kg)  02/02/16 171 lb (77.6 kg)  12/30/15 172 lb 6.4 oz (78.2 kg)   Temp Readings from Last 3 Encounters:  12/10/17 98.3 F (36.8 C) (Oral)  12/06/15 97.6 F (36.4 C) (Oral)  10/13/15 98.7 F (37.1 C) (Oral)   BP Readings from Last 3 Encounters:  12/10/17 126/88  02/02/16 106/68  01/03/16 (!) 99/57   Pulse Readings from Last 3 Encounters:  12/10/17 95  02/02/16 69  01/03/16 76   O2 sat room air 95%  Physical Exam  Constitutional: She is oriented to person, place, and time and well-developed, well-nourished, and in no distress. Vital signs are normal.  HENT:  Head: Normocephalic and atraumatic.  Mouth/Throat: Oropharynx is clear and moist and mucous membranes are normal.  Eyes: Conjunctivae are normal. Pupils are equal, round, and reactive to light.  Cardiovascular: Normal rate, regular rhythm and normal heart sounds.  1-2 + leg edema b/l compression stockings on   Pulmonary/Chest: Effort normal and breath sounds normal.  Neurological: She is alert and  oriented to person, place, and time. Gait normal. Gait normal.  Rolling walker   Skin: Skin is warm, dry and intact.  Psychiatric: Mood, memory, affect and judgment normal.  Tearful on exam   Nursing note and vitals reviewed.   Assessment   1. UTI per pt did have blood in urine 12/06/17 urine culture + Klebsiella s to macrobid  2. Trigeminal neuralgia  3. Anxiety/depression/insomnia 4. GERD  5. COPD in former smoker  6. HM 7. DM 2 cbg 126  8. HTN  9. Constipation  10. Hypothyroidism  Plan   1. Need to get [facility] obtained and reviewed was Rx Macrobid bid x 10 days [facility]  2. appt 01/03/18 neurology Dr. Salomon Fick had gamma knife x 2  Consider 1 more tx vs botox  Tylenol #3 prn  3. Consider low dose celexa TCA like imipramine 25 mg risky with h/o diabetes and elderly Try Melatonin 3-5 1 hour before bed   4. Refill protonix, carafate is helping as well  5. rec use symbicort 160 and spiriva handihaler  6.  Flu had  pna 23 vx had 09/24/06 prevnar had 09/20/16 Tdap 08/04/16 shingrix not had out of stock called pharmacy, ? zostavax if had  Out of age window pap, colonoscopy, mammo   H/o fall 09/2015 and 05/2016  DEXA 01/01/08 osteopenia will make sure on ca 600 bid and vit D 1000 iu qd   7. Follows with podiatry consistently  Ask about eye exam at f/u  Need to check urine protein/UA in future  Cont meds.  Check labs at f/u   Updated LM daugher [name] [phone]  8.cont meds off clonidine 0.1 bp was normotensive this was titrated off a while ago.  9. Prn Miralax was on colace previously unsure if taking  10also sees [facility] on levo 200 mcg qd   Check labs at f/u CMET, CBC, UA/Cx, TSH, A1C, hold on lipid if not fasting.   Time >40 minutes with pt and  care plan  Provider: Dr. Olivia Mackie [facility]

## 2017-12-16 NOTE — Addendum Note (Signed)
Addended by: Orland Mustard on: 12/16/2017 03:25 PM   Modules accepted: Orders

## 2017-12-17 ENCOUNTER — Telehealth: Payer: Self-pay | Admitting: Internal Medicine

## 2017-12-17 NOTE — Telephone Encounter (Signed)
Copied from [place] [id number]. Topic: Inquiry >> Dec 17, 2017  9:10 AM Scherrie Gerlach wrote: Reason for CRM: daughter called to advise pt's same symptoms have returned for what she was seen for last Monday 12/10/17.  Pt has urgency, pain with urination, frequency, up all night going to the restroom. Daughter would like to know what Dr Marigene Ehlers would have her do, since pt was just in the office for the same thing a week ago. Daughter states pt is hard of hearing, and wyou will need to speak with Chong Sicilian the caregiver at the home at [phone]

## 2017-12-17 NOTE — Telephone Encounter (Signed)
Please advise 

## 2017-12-17 NOTE — Telephone Encounter (Signed)
Spoke with Patty caregiver patient isn't having any fever of chills,no hematuria.  See below message. Please advise.

## 2017-12-18 ENCOUNTER — Other Ambulatory Visit: Payer: Self-pay | Admitting: Internal Medicine

## 2017-12-18 DIAGNOSIS — K219 Gastro-esophageal reflux disease without esophagitis: Secondary | ICD-10-CM

## 2017-12-18 MED ORDER — SUCRALFATE 1 GM/10ML PO SUSP: 1.0000 g | Freq: Three times a day (TID) | ORAL | 5 refills | Status: DC

## 2017-12-18 NOTE — Telephone Encounter (Signed)
Patient states she is not having symptoms today and does not need to come to office. Refused appointment.

## 2017-12-18 NOTE — Telephone Encounter (Signed)
Spoke with daughter nancy pt has appt 1/24 will keep this appt  Index

## 2017-12-18 NOTE — Telephone Encounter (Signed)
Pts daughter calling again regarding her moms possible UTI and would like a call asap.

## 2017-12-19 ENCOUNTER — Other Ambulatory Visit: Payer: Medicare Other

## 2017-12-24 ENCOUNTER — Other Ambulatory Visit (INDEPENDENT_AMBULATORY_CARE_PROVIDER_SITE_OTHER): Payer: Medicare Other

## 2017-12-24 ENCOUNTER — Ambulatory Visit (INDEPENDENT_AMBULATORY_CARE_PROVIDER_SITE_OTHER): Payer: Medicare Other | Admitting: Podiatry

## 2017-12-24 ENCOUNTER — Encounter: Payer: Self-pay | Admitting: Podiatry

## 2017-12-24 DIAGNOSIS — M79676 Pain in unspecified toe(s): Secondary | ICD-10-CM | POA: Diagnosis not present

## 2017-12-24 DIAGNOSIS — N3000 Acute cystitis without hematuria: Secondary | ICD-10-CM

## 2017-12-24 DIAGNOSIS — B351 Tinea unguium: Secondary | ICD-10-CM | POA: Diagnosis not present

## 2017-12-24 DIAGNOSIS — E039 Hypothyroidism, unspecified: Secondary | ICD-10-CM | POA: Diagnosis not present

## 2017-12-24 DIAGNOSIS — E119 Type 2 diabetes mellitus without complications: Secondary | ICD-10-CM

## 2017-12-24 DIAGNOSIS — I10 Essential (primary) hypertension: Secondary | ICD-10-CM | POA: Diagnosis not present

## 2017-12-24 LAB — CBC WITH DIFFERENTIAL/PLATELET
Basophils Absolute: 0 10*3/uL (ref 0.0–0.1)
Basophils Relative: 0.7 % (ref 0.0–3.0)
Eosinophils Absolute: 0.2 10*3/uL (ref 0.0–0.7)
Eosinophils Relative: 3.1 % (ref 0.0–5.0)
HCT: 42.4 % (ref 36.0–46.0)
Hemoglobin: 14 g/dL (ref 12.0–15.0)
Lymphocytes Relative: 32.6 % (ref 12.0–46.0)
Lymphs Abs: 2 10*3/uL (ref 0.7–4.0)
MCHC: 32.9 g/dL (ref 30.0–36.0)
MCV: 94.7 fl (ref 78.0–100.0)
Monocytes Absolute: 0.6 10*3/uL (ref 0.1–1.0)
Monocytes Relative: 10.2 % (ref 3.0–12.0)
Neutro Abs: 3.3 10*3/uL (ref 1.4–7.7)
Neutrophils Relative %: 53.4 % (ref 43.0–77.0)
Platelets: 243 10*3/uL (ref 150.0–400.0)
RBC: 4.48 Mil/uL (ref 3.87–5.11)
RDW: 14.5 % (ref 11.5–15.5)
WBC: 6.2 10*3/uL (ref 4.0–10.5)

## 2017-12-24 LAB — COMPREHENSIVE METABOLIC PANEL
ALT: 12 U/L (ref 0–35)
AST: 17 U/L (ref 0–37)
Albumin: 3.6 g/dL (ref 3.5–5.2)
Alkaline Phosphatase: 52 U/L (ref 39–117)
BUN: 15 mg/dL (ref 6–23)
CO2: 32 mEq/L (ref 19–32)
Calcium: 8.7 mg/dL (ref 8.4–10.5)
Chloride: 106 mEq/L (ref 96–112)
Creatinine, Ser: 0.97 mg/dL (ref 0.40–1.20)
GFR: 57.38 mL/min — ABNORMAL LOW (ref >60.00)
Glucose, Bld: 111 mg/dL — ABNORMAL HIGH (ref 70–99)
Potassium: 4.2 mEq/L (ref 3.5–5.1)
Sodium: 143 mEq/L (ref 135–145)
Total Bilirubin: 0.5 mg/dL (ref 0.2–1.2)
Total Protein: 6.3 g/dL (ref 6.0–8.3)

## 2017-12-24 LAB — URINALYSIS, ROUTINE W REFLEX MICROSCOPIC
Bilirubin Urine: NEGATIVE
Ketones, ur: NEGATIVE
Nitrite: POSITIVE — AB
Specific Gravity, Urine: 1.02 (ref 1.000–1.030)
Urine Glucose: NEGATIVE
Urobilinogen, UA: 0.2 (ref 0.0–1.0)
pH: 6.5 (ref 5.0–8.0)

## 2017-12-24 LAB — LIPID PANEL
Cholesterol: 155 mg/dL (ref 0–200)
HDL: 57.1 mg/dL (ref >39.00)
LDL Cholesterol: 82 mg/dL (ref 0–99)
NonHDL: 97.56
Total CHOL/HDL Ratio: 3
Triglycerides: 77 mg/dL (ref 0.0–149.0)
VLDL: 15.4 mg/dL (ref 0.0–40.0)

## 2017-12-24 LAB — MICROALBUMIN / CREATININE URINE RATIO
Creatinine,U: 150.8 mg/dL
Microalb Creat Ratio: 3.9 mg/g (ref 0.0–30.0)
Microalb, Ur: 5.8 mg/dL — ABNORMAL HIGH (ref 0.0–1.9)

## 2017-12-24 LAB — HEMOGLOBIN A1C: Hgb A1c MFr Bld: 6.3 % (ref 4.6–6.5)

## 2017-12-24 LAB — TSH: TSH: 0.25 u[IU]/mL — ABNORMAL LOW (ref 0.35–4.50)

## 2017-12-24 NOTE — Progress Notes (Signed)
She presents today chief complaint of painful elongated toenails.  Objective: Toenails are long thick yellow dystrophic clinically mycotic no open lesions or wounds are noted.  Assessment: Pain in limb secondary to onychomycosis.  Plan: Debridement of toenails 1 through 5 bilateral.

## 2017-12-25 ENCOUNTER — Ambulatory Visit: Payer: Medicare Other | Admitting: Internal Medicine

## 2017-12-26 LAB — URINE CULTURE
MICRO NUMBER:: 90085369
SPECIMEN QUALITY:: ADEQUATE

## 2017-12-27 ENCOUNTER — Encounter: Payer: Self-pay | Admitting: Internal Medicine

## 2017-12-27 ENCOUNTER — Ambulatory Visit (INDEPENDENT_AMBULATORY_CARE_PROVIDER_SITE_OTHER): Payer: Medicare Other | Admitting: Internal Medicine

## 2017-12-27 VITALS — BP 130/84 | HR 68 | Temp 99.3°F | Resp 16 | Ht 65.0 in | Wt 196.5 lb

## 2017-12-27 DIAGNOSIS — G500 Trigeminal neuralgia: Secondary | ICD-10-CM | POA: Diagnosis not present

## 2017-12-27 DIAGNOSIS — E039 Hypothyroidism, unspecified: Secondary | ICD-10-CM | POA: Diagnosis not present

## 2017-12-27 DIAGNOSIS — K219 Gastro-esophageal reflux disease without esophagitis: Secondary | ICD-10-CM | POA: Diagnosis not present

## 2017-12-27 DIAGNOSIS — N3001 Acute cystitis with hematuria: Secondary | ICD-10-CM

## 2017-12-27 MED ORDER — LEVOTHYROXINE SODIUM 200 MCG PO TABS: ORAL_TABLET | ORAL | 3 refills | Status: DC

## 2017-12-27 MED ORDER — PANTOPRAZOLE SODIUM 40 MG PO TBEC: 40.0000 mg | DELAYED_RELEASE_TABLET | Freq: Every day | ORAL | 3 refills | Status: DC

## 2017-12-27 MED ORDER — CIPROFLOXACIN HCL 500 MG PO TABS: 500.0000 mg | ORAL_TABLET | Freq: Two times a day (BID) | ORAL | 0 refills | Status: DC

## 2017-12-27 NOTE — Patient Instructions (Signed)
Please see the change in dose of levothyroxine thyroid medication  Take cipro 500 mg 2x per day x 3 days  Follow up in 3 months Take care   Urinary Tract Infection, Adult A urinary tract infection (UTI) is an infection of any part of the urinary tract. The urinary tract includes the:  Kidneys.  Ureters.  Bladder.  Urethra.  These organs make, store, and get rid of pee (urine) in the body. Follow these instructions at home:  Take over-the-counter and prescription medicines only as told by your doctor.  If you were prescribed an antibiotic medicine, take it as told by your doctor. Do not stop taking the antibiotic even if you start to feel better.  Avoid the following drinks: ? Alcohol. ? Caffeine. ? Tea. ? Carbonated drinks.  Drink enough fluid to keep your pee clear or pale yellow.  Keep all follow-up visits as told by your doctor. This is important.  Make sure to: ? Empty your bladder often and completely. Do not to hold pee for long periods of time. ? Empty your bladder before and after sex. ? Wipe from front to back after a bowel movement if you are female. Use each tissue one time when you wipe. Contact a doctor if:  You have back pain.  You have a fever.  You feel sick to your stomach (nauseous).  You throw up (vomit).  Your symptoms do not get better after 3 days.  Your symptoms go away and then come back. Get help right away if:  You have very bad back pain.  You have very bad lower belly (abdominal) pain.  You are throwing up and cannot keep down any medicines or water. This information is not intended to replace advice given to you by your health care provider. Make sure you discuss any questions you have with your health care provider. Document Released: 05/08/2008 Document Revised: 04/27/2016 Document Reviewed: 10/11/2015 Elsevier Interactive Patient Education  [employer].

## 2017-12-28 ENCOUNTER — Telehealth: Payer: Self-pay

## 2017-12-28 NOTE — Telephone Encounter (Signed)
Copied from [place] [id number]. Topic: General - Other >> Dec 28, 2017 12:39 PM Valla Leaver wrote: Reason for CRM: Dr. Olivia Mackie M-S wanted patient to inform her of where she got her compression stockings. Caregiver, Jacqlyn Larsen, calling to report they came from [place].

## 2017-12-30 ENCOUNTER — Encounter: Payer: Self-pay | Admitting: Internal Medicine

## 2017-12-30 NOTE — Progress Notes (Signed)
Chief Complaint  Patient presents with  . Follow-up   Follow up with caretaker Becky  1. Recent UTI with Klebsiella tx'ed with macrobid x 10 days and infection still present. She is having increased urinary frequency  2. She c/o right trigeminal neuralgia pain and has f/u neurology [facility] 01/03/18 to disc options for tx She had to take tylenol #3 today   3. She has started to ride her bike 4x per week  4. TSH low she was taking levothyroxine 200 mg qd except sundays 1/2 pill prior to that she was taking 200 mg qd and no medications on Sunday per Endocrine Dr. Gabriel Carina and TSH was controlled.  5. DM 2 controlled on metformin 500 mg qd A1C 6.3    Review of Systems  Constitutional: Negative for weight loss.  HENT: Positive for hearing loss.        Right hearing aid not working   Respiratory: Negative for shortness of breath.   Cardiovascular: Negative for chest pain.  Genitourinary: Positive for frequency.  Skin: Negative for rash.  Neurological:       +right trigminal neuralgia pain    Past Medical History:  Diagnosis Date  . Asthma   . Bilateral swelling of feet    and legs  . CAD (coronary artery disease)   . Compression fracture of body of thoracic vertebra (HCC)    T12 09/18/15 MRI s/p fall   . Constipation   . COPD (chronic obstructive pulmonary disease) ([place])    previous CXR with chronic interstitial lung dz   . Diabetes ([place])    with neuropathy  . DVT (deep venous thrombosis) ([place])    right leg 10/2015 was on coumadin off as of 2017/2018 ; s/p IVC filter  . Fatty liver    09/15/15 also mildly dilated pancreatitic duct rec MRCP small sub cm cyst hemangioma speeln mild right hydronephrorossi and prox. hydroureter, kidney stones, mild scarring kidneys  . GERD (gastroesophageal reflux disease)    with small hiatal hernia   . Hard of hearing   . History of kidney problems   . Hyperlipidemia   . Hypertension   . Impaired mobility and ADLs    uses rolling walker has caretaker  24/7 at home  . Leg edema   . Neuropathy   . Osteoarthritis    DDD spine   . Pulmonary embolism ([place])    10/2015 off coumadin as of 04/2016  . Pulmonary HTN (HCC)    mild pulm HTN, echo 10/09/15 EF 91-47%WGNFA 1 dd, RV systolic pressure increased   . Recurrent UTI   . Recurrent UTI   . Skin cancer    BCC jawline and scalp   . Thyroid disease    follows [facility]  . TIA (transient ischemic attack)    MRI 2009/2010 neg stroke   . Trigeminal neuralgia    Dr. Tomi Bamberger s/p gamma knife x 2, on Tegretol since 2011/2012 no increase in dose >200 mg bid rec per family per neurology   . Urinary, incontinence, stress female    Dr Erlene Quan urology    Past Surgical History:  Procedure Laterality Date  . APPENDECTOMY    . BRAIN SURGERY     schwnnoma removal 1996   . brain tumor surgery    . BREAST SURGERY     breast bx  . CHOLECYSTECTOMY    . EYE SURGERY     cataract  . IVC FILTER PLACEMENT ([facility] HX)     Dr. Lucky Cowboy 10/2015   .  MOHS SURGERY     scalp 04/2014   . PERIPHERAL VASCULAR CATHETERIZATION N/A 10/11/2015   Procedure: IVC Filter Insertion;  Surgeon: Algernon Huxley, MD;  Location: [facility];  Service: Cardiovascular;  Laterality: N/A;  . PUBOVAGINAL SLING    . THROAT SURGERY    . THYROID SURGERY    . THYROID SURGERY     tumor around vocal cords   . TOOTH EXTRACTION     winter 2018   . TUBAL LIGATION     Family History  Problem Relation Age of Onset  . Heart disease Mother   . Diabetes Father   . Cancer Daughter        breast ca x 2 s/p mastectomy    Social History   Socioeconomic History  . Marital status: Widowed    Spouse name: Not on file  . Number of children: Not on file  . Years of education: Not on file  . Highest education level: Not on file  Social Needs  . Financial resource strain: Not on file  . Food insecurity - worry: Not on file  . Food insecurity - inability: Not on file  . Transportation needs - medical: Not on file  . Transportation  needs - non-medical: Not on file  Occupational History  . Not on file  Tobacco Use  . Smoking status: Former Research scientist (life sciences)  . Smokeless tobacco: Never Used  . Tobacco comment: quit 1996 smoked 20 years max 8 cig qd   Substance and Sexual Activity  . Alcohol use: No  . Drug use: No  . Sexual activity: Not on file  Other Topics Concern  . Not on file  Social History Narrative  . Not on file   Current Meds  Medication Sig  . acetaminophen (TYLENOL) 325 MG tablet Take 2 tablets (650 mg total) by mouth every 6 (six) hours as needed.  Marland Kitchen acetaminophen-codeine (TYLENOL #3) 300-30 MG tablet   . acetaminophen-codeine (TYLENOL #3) 300-30 MG tablet Take 1 tablet by mouth daily as needed for moderate pain.  Marland Kitchen albuterol (PROVENTIL HFA;VENTOLIN HFA) 108 (90 Base) MCG/ACT inhaler Inhale 1-2 puffs into the lungs every 6 (six) hours as needed for wheezing or shortness of breath (PrOAIR).  Marland Kitchen aspirin EC 81 MG tablet Take by mouth.  Marland Kitchen atorvastatin (LIPITOR) 40 MG tablet Take 40 mg by mouth daily at 6 PM.   . budesonide-formoterol (SYMBICORT) 160-4.5 MCG/ACT inhaler Inhale 2 puffs into the lungs 2 (two) times daily.  . Calcium Carb-Cholecalciferol (CALCIUM CARBONATE-VITAMIN D3 PO) Take by mouth.  . Calcium Carbonate-Vitamin D (CALCIUM 600+D) 600-400 MG-UNIT tablet Take 1 tablet by mouth daily.  . carbamazepine (TEGRETOL) 200 MG tablet Take 200 mg by mouth 2 (two) times daily.  . cetirizine (ZYRTEC) 10 MG tablet Take by mouth.  . enoxaparin (LOVENOX) 120 MG/0.8ML injection Inject 120 mg into the skin daily. Reported on 12/30/2015  . fluticasone (FLONASE) 50 MCG/ACT nasal spray Place 1 spray into both nostrils daily.  . fosinopril (MONOPRIL) 10 MG tablet Take 10 mg by mouth daily.  . furosemide (LASIX) 20 MG tablet Take 20 mg by mouth daily.  Marland Kitchen guaiFENesin (ROBITUSSIN) 100 MG/5ML liquid Take 200 mg by mouth daily.  Marland Kitchen levothyroxine (SYNTHROID, LEVOTHROID) 200 MCG tablet Daily before breakfast Monday-Saturday. DO  NOT TAKE ON SUNDAYS  . metFORMIN (GLUCOPHAGE) 500 MG tablet Take 500 mg by mouth daily with breakfast.   . Multiple Vitamin (MULTIVITAMIN WITH MINERALS) TABS tablet Take 1 tablet by mouth daily.  Marland Kitchen  pantoprazole (PROTONIX) 40 MG tablet Take 1 tablet (40 mg total) by mouth daily.  Vladimir Faster Glycol-Propyl Glycol 0.4-0.3 % SOLN Apply to eye at bedtime.  . polyethylene glycol powder (GLYCOLAX/MIRALAX) powder   . sucralfate (CARAFATE) 1 GM/10ML suspension Take 10 mLs (1 g total) by mouth 4 (four) times daily -  with meals and at bedtime.  Marland Kitchen tiotropium (SPIRIVA) 18 MCG inhalation capsule Place 18 mcg into inhaler and inhale daily.  . Tiotropium Bromide Monohydrate 1.25 MCG/ACT AERS Inhale into the lungs.  . [DISCONTINUED] levothyroxine (SYNTHROID, LEVOTHROID) 200 MCG tablet Take 200 mcg by mouth daily before breakfast.  . [DISCONTINUED] pantoprazole (PROTONIX) 40 MG tablet Take 1 tablet (40 mg total) by mouth daily.  . [DISCONTINUED] warfarin (COUMADIN) 5 MG tablet Take 15 mg by mouth daily. Reported on 12/16/2015   Allergies  Allergen Reactions  . Penicillins Shortness Of Breath, Rash and Other (See Comments)    Has patient had a PCN reaction causing immediate rash, facial/tongue/throat swelling, SOB or lightheadedness with hypotension: Yes Has patient had a PCN reaction causing severe rash involving mucus membranes or skin necrosis: No Has patient had a PCN reaction that required hospitalization No Has patient had a PCN reaction occurring within the last 10 years: No If all of the above answers are "NO", then may proceed with Cephalosporin use.  . Sulfa Antibiotics Shortness Of Breath, Rash and Other (See Comments)  . Amitiza [Lubiprostone]     N/v/d  . Aspirin Other (See Comments)    Reaction:  Unknown   . Penicillin G Other (See Comments)   Recent Results (from the past 2160 hour(s))  POCT CBG (Fasting - Glucose)     Status: Abnormal   Collection Time: 12/13/17 10:39 AM  Result Value Ref  Range   Glucose Fasting, POC 126 (A) 70 - 99 mg/dL  Urine Microalbumin w/creat. ratio     Status: Abnormal   Collection Time: 12/24/17  9:31 AM  Result Value Ref Range   Microalb, Ur 5.8 (H) 0.0 - 1.9 mg/dL   Creatinine,U 150.8 mg/dL   Microalb Creat Ratio 3.9 0.0 - 30.0 mg/g  Urine Culture     Status: Abnormal   Collection Time: 12/24/17  9:31 AM  Result Value Ref Range   MICRO NUMBER: [id number]    SPECIMEN QUALITY: ADEQUATE    Sample Source NOT GIVEN    STATUS: FINAL    ISOLATE 1: Klebsiella pneumoniae (A)       Susceptibility   Klebsiella pneumoniae - URINE CULTURE, REFLEX    AMOX/CLAVULANIC <=2 Sensitive     AMPICILLIN  Resistant     AMPICILLIN/SULBACTAM <=2 Sensitive     CEFAZOLIN* <=4 Not Reportable      * For infections other than uncomplicated UTIcaused by E. coli, K. pneumoniae or P. mirabilis:Cefazolin is resistant if MIC > or = 8 mcg/mL.(Distinguishing susceptible versus intermediatefor isolates with MIC < or = 4 mcg/mL requiresadditional testing.)For uncomplicated UTI caused by E. coli,K. pneumoniae or P. mirabilis: Cefazolin issusceptible if MIC <32 mcg/mL and predictssusceptible to the oral agents cefaclor, cefdinir,cefpodoxime, cefprozil, cefuroxime, cephalexinand loracarbef.    CEFEPIME <=1 Sensitive     CEFTRIAXONE <=1 Sensitive     CIPROFLOXACIN <=0.25 Sensitive     LEVOFLOXACIN <=0.12 Sensitive     ERTAPENEM <=0.5 Sensitive     GENTAMICIN <=1 Sensitive     IMIPENEM <=0.25 Sensitive     NITROFURANTOIN 64 Intermediate     PIP/TAZO <=4 Sensitive     TOBRAMYCIN <=1 Sensitive  TRIMETH/SULFA* <=20 Sensitive      * For infections other than uncomplicated UTIcaused by E. coli, K. pneumoniae or P. mirabilis:Cefazolin is resistant if MIC > or = 8 mcg/mL.(Distinguishing susceptible versus intermediatefor isolates with MIC < or = 4 mcg/mL requiresadditional testing.)For uncomplicated UTI caused by E. coli,K. pneumoniae or P. mirabilis: Cefazolin issusceptible if MIC <32  mcg/mL and predictssusceptible to the oral agents cefaclor, cefdinir,cefpodoxime, cefprozil, cefuroxime, cephalexinand loracarbef.Legend:S = Susceptible  I = IntermediateR = Resistant  NS = Not susceptible* = Not tested  NR = Not reported**NN = See antimicrobic comments  Urinalysis, Routine w reflex microscopic     Status: Abnormal   Collection Time: 12/24/17  9:31 AM  Result Value Ref Range   Color, Urine YELLOW Yellow;Lt. Yellow   APPearance Cloudy (A) Clear   Specific Gravity, Urine 1.020 1.000 - 1.030   pH 6.5 5.0 - 8.0   Total Protein, Urine TRACE (A) Negative   Urine Glucose NEGATIVE Negative   Ketones, ur NEGATIVE Negative   Bilirubin Urine NEGATIVE Negative   Hgb urine dipstick TRACE-INTACT (A) Negative   Urobilinogen, UA 0.2 0.0 - 1.0   Leukocytes, UA LARGE (A) Negative   Nitrite POSITIVE (A) Negative   WBC, UA TNTC(>50/hpf) (A) 0-2/hpf   RBC / HPF 3-6/hpf (A) 0-2/hpf   Mucus, UA Presence of (A) None   Squamous Epithelial / LPF Few(5-10/hpf) (A) Rare(0-4/hpf)   Bacteria, UA Many(>50/hpf) (A) None  Lipid panel     Status: None   Collection Time: 12/24/17  9:31 AM  Result Value Ref Range   Cholesterol 155 0 - 200 mg/dL    Comment: ATP III Classification       Desirable:  < 200 mg/dL               Borderline High:  200 - 239 mg/dL          High:  > = 240 mg/dL   Triglycerides 77.0 0.0 - 149.0 mg/dL    Comment: Normal:  <150 mg/dLBorderline High:  150 - 199 mg/dL   HDL 57.10 >39.00 mg/dL   VLDL 15.4 0.0 - 40.0 mg/dL   LDL Cholesterol 82 0 - 99 mg/dL   Total CHOL/HDL Ratio 3     Comment:                Men          Women1/2 Average Risk     3.4          3.3Average Risk          5.0          4.42X Average Risk          9.6          7.13X Average Risk          15.0          11.0                       NonHDL 97.56     Comment: NOTE:  Non-HDL goal should be 30 mg/dL higher than patient's LDL goal (i.e. LDL goal of < 70 mg/dL, would have non-HDL goal of < 100 mg/dL)  TSH     Status:  Abnormal   Collection Time: 12/24/17  9:31 AM  Result Value Ref Range   TSH 0.25 (L) 0.35 - 4.50 uIU/mL  Hemoglobin A1c     Status: None   Collection Time: 12/24/17  9:31 AM  Result Value Ref Range   Hgb A1c MFr Bld 6.3 4.6 - 6.5 %    Comment: Glycemic Control Guidelines for People with Diabetes:Non Diabetic:  <6%Goal of Therapy: <7%Additional Action Suggested:  >8%   CBC w/Diff     Status: None   Collection Time: 12/24/17  9:31 AM  Result Value Ref Range   WBC 6.2 4.0 - 10.5 K/uL   RBC 4.48 3.87 - 5.11 Mil/uL   Hemoglobin 14.0 12.0 - 15.0 g/dL   HCT 42.4 36.0 - 46.0 %   MCV 94.7 78.0 - 100.0 fl   MCHC 32.9 30.0 - 36.0 g/dL   RDW 14.5 11.5 - 15.5 %   Platelets 243.0 150.0 - 400.0 K/uL   Neutrophils Relative % 53.4 43.0 - 77.0 %   Lymphocytes Relative 32.6 12.0 - 46.0 %   Monocytes Relative 10.2 3.0 - 12.0 %   Eosinophils Relative 3.1 0.0 - 5.0 %   Basophils Relative 0.7 0.0 - 3.0 %   Neutro Abs 3.3 1.4 - 7.7 K/uL   Lymphs Abs 2.0 0.7 - 4.0 K/uL   Monocytes Absolute 0.6 0.1 - 1.0 K/uL   Eosinophils Absolute 0.2 0.0 - 0.7 K/uL   Basophils Absolute 0.0 0.0 - 0.1 K/uL  Comprehensive metabolic panel     Status: Abnormal   Collection Time: 12/24/17  9:31 AM  Result Value Ref Range   Sodium 143 135 - 145 mEq/L   Potassium 4.2 3.5 - 5.1 mEq/L   Chloride 106 96 - 112 mEq/L   CO2 32 19 - 32 mEq/L   Glucose, Bld 111 (H) 70 - 99 mg/dL   BUN 15 6 - 23 mg/dL   Creatinine, Ser 0.97 0.40 - 1.20 mg/dL   Total Bilirubin 0.5 0.2 - 1.2 mg/dL   Alkaline Phosphatase 52 39 - 117 U/L   AST 17 0 - 37 U/L   ALT 12 0 - 35 U/L   Total Protein 6.3 6.0 - 8.3 g/dL   Albumin 3.6 3.5 - 5.2 g/dL   Calcium 8.7 8.4 - 10.5 mg/dL   GFR 57.38 (L) >60.00 mL/min   Objective  Body mass index is 32.7 kg/m. Wt Readings from Last 3 Encounters:  12/27/17 196 lb 8 oz (89.1 kg)  12/10/17 196 lb 4 oz (89 kg)  02/02/16 171 lb (77.6 kg)   Temp Readings from Last 3 Encounters:  12/27/17 99.3 F (37.4 C)  (Oral)  12/10/17 98.3 F (36.8 C) (Oral)  12/06/15 97.6 F (36.4 C) (Oral)   BP Readings from Last 3 Encounters:  12/27/17 130/84  12/10/17 126/88  02/02/16 106/68   Pulse Readings from Last 3 Encounters:  12/27/17 68  12/10/17 95  02/02/16 69   O2 sat room air 96%  Physical Exam  Constitutional: She is oriented to person, place, and time and well-developed, well-nourished, and in no distress. Vital signs are normal.  HENT:  Head: Normocephalic and atraumatic.  Mouth/Throat: Oropharynx is clear and moist and mucous membranes are normal.  Eyes: Conjunctivae are normal. Pupils are equal, round, and reactive to light.  Cardiovascular: Normal rate, regular rhythm and normal heart sounds.  1+ leg edema today with compression stockings in place today   Pulmonary/Chest: Effort normal. She has wheezes.  Mild wheezing on exam today   Abdominal: Soft. Bowel sounds are normal. There is no tenderness.  Neurological: She is alert and oriented to person, place, and time. Gait normal.  Using rollator with seat today   Skin: Skin is warm,  dry and intact.  Psychiatric: Mood, memory, affect and judgment normal.  Vitals reviewed.   Assessment   1. Klebsiella UTI unresolved  2. Hypothyroidism uncontrolled 3. Trigeminal neuralgia uncontrolled 4. HM Plan  1. cipro 500 bid x 3 days just completed macrobid 100 bid x 10 days  2. Levothyroxine 200 mg M-Saturday no meds on Sunday former dose from Dr. Gabriel Carina  Repeat TSH at f/u  3. F/u neurology Dr. Salomon Fick in [place] 01/03/18 4.  Reviewed labs today  See HM 12/10/17  Due for pna 23 last had 09/24/06 address at f/u ask if had another location, shingrix still not had on backorder  Provider: Dr. Olivia Mackie McLean-Scocuzza-Internal Medicine

## 2017-12-31 ENCOUNTER — Telehealth: Payer: Self-pay

## 2017-12-31 NOTE — Telephone Encounter (Signed)
Copied from [place] [phone]. Topic: General - Other >> Dec 31, 2017  9:51 AM Bea Graff, NT wrote: Reason for CRM: Pt would like to see if a DPR form can be mailed to her house in order to add her children to the [facility]?

## 2017-12-31 NOTE — Telephone Encounter (Signed)
Talked to pt and told her that she would have to come in and pickup.. We do not mail DPR's

## 2018-01-04 ENCOUNTER — Ambulatory Visit: Payer: Medicare Other | Admitting: Internal Medicine

## 2018-01-10 ENCOUNTER — Other Ambulatory Visit: Payer: Self-pay | Admitting: Internal Medicine

## 2018-01-10 ENCOUNTER — Telehealth: Payer: Self-pay | Admitting: Internal Medicine

## 2018-01-10 DIAGNOSIS — J449 Chronic obstructive pulmonary disease, unspecified: Secondary | ICD-10-CM

## 2018-01-10 MED ORDER — TIOTROPIUM BROMIDE MONOHYDRATE 18 MCG IN CAPS: 18.0000 ug | ORAL_CAPSULE | Freq: Every day | RESPIRATORY_TRACT | 3 refills | Status: DC

## 2018-01-10 NOTE — Telephone Encounter (Signed)
Copied from [place]. Topic: Quick Communication - See Telephone Encounter >> Jan 10, 2018 12:23 PM Boyd Kerbs wrote: CRM for notification. See Telephone encounter for:   [name], daughter, called said she called on Mon. 2/4 and not showing CRM request, they are needing prescription totropium (SPIRIVA) 18 MCG inhalation capsule,   this has to sent to:  [facility] [place] [address] Phone: [phone] Fax: [phone]  [facility] sent request to office too. No response   Stated they have already lost 5 days and it will take 10 days to get and she will need it.  Please ASAP   01/10/18.

## 2018-01-10 NOTE — Telephone Encounter (Signed)
Patient is needing Rx refill on Spiriva 18 mcg. This is a historical medication.  LOV: 12/10/17- to establish care ... notes #5 recommend patient use...  Pharmacy: [facility]

## 2018-01-10 NOTE — Telephone Encounter (Signed)
[facility] did not receive notification earlier this week   [place]

## 2018-01-10 NOTE — Telephone Encounter (Signed)
Ok to refill 

## 2018-01-30 ENCOUNTER — Telehealth: Payer: Self-pay | Admitting: Internal Medicine

## 2018-01-30 DIAGNOSIS — J449 Chronic obstructive pulmonary disease, unspecified: Secondary | ICD-10-CM

## 2018-01-30 NOTE — Telephone Encounter (Signed)
Copied from [place] #[id number]. Topic: Quick Communication - Rx Refill/Question >> Jan 30, 2018 11:17 AM Scherrie Gerlach wrote: Medication: budesonide-formoterol (SYMBICORT) 160-4.5 MCG/ACT inhaler  Daughter called and states she called on Monday to let the doctor know [facility] will send a new request for this med. But also said she thought someone had sent a request as well. [facility] told  Daughter they sent ,but I do not see anything from them. Pt is going to run out if they do not get this today. The previous pharmacy has gone out of business.  [place], [place] [employer] [phone] (Phone) [phone] (Fax)

## 2018-01-31 MED ORDER — BUDESONIDE-FORMOTEROL FUMARATE 160-4.5 MCG/ACT IN AERO: 2.0000 | INHALATION_SPRAY | Freq: Two times a day (BID) | RESPIRATORY_TRACT | 2 refills | Status: DC

## 2018-01-31 NOTE — Telephone Encounter (Signed)
Budesonide-formoterol refill Last OV: 1/719 Last Refill:unknown Pharmacy:[facility] [place].

## 2018-02-05 ENCOUNTER — Other Ambulatory Visit: Payer: Self-pay | Admitting: Internal Medicine

## 2018-02-05 DIAGNOSIS — E039 Hypothyroidism, unspecified: Secondary | ICD-10-CM

## 2018-02-05 MED ORDER — LEVOTHYROXINE SODIUM 200 MCG PO TABS: ORAL_TABLET | ORAL | 3 refills | Status: DC

## 2018-02-07 ENCOUNTER — Other Ambulatory Visit: Payer: Self-pay | Admitting: Internal Medicine

## 2018-02-07 DIAGNOSIS — J449 Chronic obstructive pulmonary disease, unspecified: Secondary | ICD-10-CM

## 2018-02-07 MED ORDER — BUDESONIDE-FORMOTEROL FUMARATE 160-4.5 MCG/ACT IN AERO: 2.0000 | INHALATION_SPRAY | Freq: Two times a day (BID) | RESPIRATORY_TRACT | 0 refills | Status: DC

## 2018-02-11 ENCOUNTER — Telehealth: Payer: Self-pay | Admitting: *Deleted

## 2018-02-11 NOTE — Telephone Encounter (Signed)
Copied from [place]. Topic: Inquiry >> Feb 11, 2018 12:09 PM Oliver Pila B wrote: Reason for CRM: pt's daughter called b/c she wants to inform pcp on the 7th of march that pt had a gamma knife procedure for trigeminal neuralgia, a week prior she was having pain and the Rx of carbamazepine (TEGRETOL) 200 MG tablet [[id number]]  was increased to every 8 hrs(3x's a day) but as pain goes away they will decrease dosage, pt's daughter did state that the facility she had the procedure done should send a report of that to pcp

## 2018-02-11 NOTE — Telephone Encounter (Signed)
FYI

## 2018-02-15 ENCOUNTER — Other Ambulatory Visit: Payer: Self-pay | Admitting: Internal Medicine

## 2018-02-15 MED ORDER — CARBAMAZEPINE 200 MG PO TABS: 200.0000 mg | ORAL_TABLET | Freq: Three times a day (TID) | ORAL | Status: DC

## 2018-02-15 NOTE — Telephone Encounter (Signed)
Thank you for update  Did not received report yet from [facility] I will try to see if it is in epic elsewhere  Thanks [place]

## 2018-02-19 ENCOUNTER — Other Ambulatory Visit: Payer: Self-pay

## 2018-02-19 MED ORDER — METFORMIN HCL 500 MG PO TABS: 500.0000 mg | ORAL_TABLET | Freq: Every day | ORAL | 1 refills | Status: DC

## 2018-03-03 ENCOUNTER — Encounter: Payer: Self-pay | Admitting: Radiology

## 2018-03-03 ENCOUNTER — Emergency Department: Payer: Medicare Other

## 2018-03-03 ENCOUNTER — Inpatient Hospital Stay
Admission: EM | Admit: 2018-03-03 | Discharge: 2018-03-05 | DRG: 312 | Disposition: A | Discharge status: Home Health | Payer: Medicare Other | Attending: Internal Medicine | Admitting: Internal Medicine

## 2018-03-03 DIAGNOSIS — J449 Chronic obstructive pulmonary disease, unspecified: Secondary | ICD-10-CM | POA: Diagnosis not present

## 2018-03-03 DIAGNOSIS — Z9849 Cataract extraction status, unspecified eye: Secondary | ICD-10-CM

## 2018-03-03 DIAGNOSIS — K760 Fatty (change of) liver, not elsewhere classified: Secondary | ICD-10-CM | POA: Diagnosis present

## 2018-03-03 DIAGNOSIS — E1140 Type 2 diabetes mellitus with diabetic neuropathy, unspecified: Secondary | ICD-10-CM | POA: Diagnosis not present

## 2018-03-03 DIAGNOSIS — Z86718 Personal history of other venous thrombosis and embolism: Secondary | ICD-10-CM

## 2018-03-03 DIAGNOSIS — I2510 Atherosclerotic heart disease of native coronary artery without angina pectoris: Secondary | ICD-10-CM | POA: Diagnosis present

## 2018-03-03 DIAGNOSIS — M1990 Unspecified osteoarthritis, unspecified site: Secondary | ICD-10-CM | POA: Diagnosis not present

## 2018-03-03 DIAGNOSIS — I10 Essential (primary) hypertension: Secondary | ICD-10-CM | POA: Diagnosis present

## 2018-03-03 DIAGNOSIS — Z8249 Family history of ischemic heart disease and other diseases of the circulatory system: Secondary | ICD-10-CM

## 2018-03-03 DIAGNOSIS — I951 Orthostatic hypotension: Secondary | ICD-10-CM | POA: Diagnosis present

## 2018-03-03 DIAGNOSIS — K219 Gastro-esophageal reflux disease without esophagitis: Secondary | ICD-10-CM | POA: Diagnosis not present

## 2018-03-03 DIAGNOSIS — E860 Dehydration: Secondary | ICD-10-CM | POA: Diagnosis present

## 2018-03-03 DIAGNOSIS — Z7951 Long term (current) use of inhaled steroids: Secondary | ICD-10-CM

## 2018-03-03 DIAGNOSIS — Z8673 Personal history of transient ischemic attack (TIA), and cerebral infarction without residual deficits: Secondary | ICD-10-CM | POA: Diagnosis not present

## 2018-03-03 DIAGNOSIS — R102 Pelvic and perineal pain: Secondary | ICD-10-CM

## 2018-03-03 DIAGNOSIS — N179 Acute kidney failure, unspecified: Secondary | ICD-10-CM | POA: Diagnosis present

## 2018-03-03 DIAGNOSIS — Z833 Family history of diabetes mellitus: Secondary | ICD-10-CM

## 2018-03-03 DIAGNOSIS — Z95828 Presence of other vascular implants and grafts: Secondary | ICD-10-CM

## 2018-03-03 DIAGNOSIS — Z86711 Personal history of pulmonary embolism: Secondary | ICD-10-CM

## 2018-03-03 DIAGNOSIS — H9190 Unspecified hearing loss, unspecified ear: Secondary | ICD-10-CM | POA: Diagnosis present

## 2018-03-03 DIAGNOSIS — Z7982 Long term (current) use of aspirin: Secondary | ICD-10-CM

## 2018-03-03 DIAGNOSIS — E785 Hyperlipidemia, unspecified: Secondary | ICD-10-CM | POA: Diagnosis present

## 2018-03-03 DIAGNOSIS — I272 Pulmonary hypertension, unspecified: Secondary | ICD-10-CM | POA: Diagnosis not present

## 2018-03-03 DIAGNOSIS — Z87440 Personal history of urinary (tract) infections: Secondary | ICD-10-CM | POA: Diagnosis not present

## 2018-03-03 DIAGNOSIS — Z87891 Personal history of nicotine dependence: Secondary | ICD-10-CM

## 2018-03-03 DIAGNOSIS — Z882 Allergy status to sulfonamides status: Secondary | ICD-10-CM

## 2018-03-03 DIAGNOSIS — R42 Dizziness and giddiness: Secondary | ICD-10-CM

## 2018-03-03 DIAGNOSIS — Z803 Family history of malignant neoplasm of breast: Secondary | ICD-10-CM

## 2018-03-03 DIAGNOSIS — Z85828 Personal history of other malignant neoplasm of skin: Secondary | ICD-10-CM | POA: Diagnosis not present

## 2018-03-03 DIAGNOSIS — Z79899 Other long term (current) drug therapy: Secondary | ICD-10-CM

## 2018-03-03 DIAGNOSIS — Z880 Allergy status to penicillin: Secondary | ICD-10-CM

## 2018-03-03 DIAGNOSIS — Z7984 Long term (current) use of oral hypoglycemic drugs: Secondary | ICD-10-CM

## 2018-03-03 DIAGNOSIS — Z888 Allergy status to other drugs, medicaments and biological substances status: Secondary | ICD-10-CM

## 2018-03-03 DIAGNOSIS — Z79890 Hormone replacement therapy (postmenopausal): Secondary | ICD-10-CM

## 2018-03-03 DIAGNOSIS — Z9049 Acquired absence of other specified parts of digestive tract: Secondary | ICD-10-CM

## 2018-03-03 DIAGNOSIS — Z87442 Personal history of urinary calculi: Secondary | ICD-10-CM

## 2018-03-03 LAB — CBC WITH DIFFERENTIAL/PLATELET
Basophils Absolute: 0 10*3/uL (ref 0–0.1)
Basophils Relative: 0 %
Eosinophils Absolute: 0.1 10*3/uL (ref 0–0.7)
Eosinophils Relative: 2 %
HCT: 42.7 % (ref 35.0–47.0)
Hemoglobin: 13.8 g/dL (ref 12.0–16.0)
Lymphocytes Relative: 19 %
Lymphs Abs: 1.4 10*3/uL (ref 1.0–3.6)
MCH: 30.9 pg (ref 26.0–34.0)
MCHC: 32.3 g/dL (ref 32.0–36.0)
MCV: 95.7 fL (ref 80.0–100.0)
Monocytes Absolute: 0.6 10*3/uL (ref 0.2–0.9)
Monocytes Relative: 8 %
Neutro Abs: 5.2 10*3/uL (ref 1.4–6.5)
Neutrophils Relative %: 71 %
Platelets: 198 10*3/uL (ref 150–440)
RBC: 4.46 MIL/uL (ref 3.80–5.20)
RDW: 14.2 % (ref 11.5–14.5)
WBC: 7.3 10*3/uL (ref 3.6–11.0)

## 2018-03-03 LAB — COMPREHENSIVE METABOLIC PANEL
ALT: 19 U/L (ref 14–54)
AST: 26 U/L (ref 15–41)
Albumin: 3.7 g/dL (ref 3.5–5.0)
Alkaline Phosphatase: 65 U/L (ref 38–126)
Anion gap: 10 (ref 5–15)
BUN: 20 mg/dL (ref 6–20)
CO2: 26 mmol/L (ref 22–32)
Calcium: 8.8 mg/dL — ABNORMAL LOW (ref 8.9–10.3)
Chloride: 104 mmol/L (ref 101–111)
Creatinine, Ser: 1.16 mg/dL — ABNORMAL HIGH (ref 0.44–1.00)
GFR calc Af Amer: 47 mL/min — ABNORMAL LOW (ref >60)
GFR calc non Af Amer: 40 mL/min — ABNORMAL LOW (ref >60)
Glucose, Bld: 135 mg/dL — ABNORMAL HIGH (ref 65–99)
Potassium: 4.5 mmol/L (ref 3.5–5.1)
Sodium: 140 mmol/L (ref 135–145)
Total Bilirubin: 0.4 mg/dL (ref 0.3–1.2)
Total Protein: 7.1 g/dL (ref 6.5–8.1)

## 2018-03-03 LAB — URINALYSIS, COMPLETE (UACMP) WITH MICROSCOPIC
Bacteria, UA: NONE SEEN
Bilirubin Urine: NEGATIVE
Glucose, UA: NEGATIVE mg/dL
Hgb urine dipstick: NEGATIVE
Ketones, ur: NEGATIVE mg/dL
Leukocytes, UA: NEGATIVE
Nitrite: NEGATIVE
Protein, ur: 30 mg/dL — AB
Specific Gravity, Urine: 1.024 (ref 1.005–1.030)
pH: 5 (ref 5.0–8.0)

## 2018-03-03 LAB — LIPASE, BLOOD: Lipase: 30 U/L (ref 11–51)

## 2018-03-03 MED ORDER — LISINOPRIL 10 MG PO TABS: 10.0000 mg | ORAL_TABLET | Freq: Every day | ORAL | Status: DC

## 2018-03-03 MED ORDER — ADULT MULTIVITAMIN W/MINERALS CH
1.0000 | ORAL_TABLET | Freq: Every day | ORAL | Status: DC
  Administered 2018-03-04 – 2018-03-05 (×2): 1 via ORAL
  Filled 2018-03-03 (×2): qty 1

## 2018-03-03 MED ORDER — POLYVINYL ALCOHOL 1.4 % OP SOLN
1.0000 [drp] | Freq: Every day | OPHTHALMIC | Status: DC
  Filled 2018-03-03: qty 15

## 2018-03-03 MED ORDER — ATORVASTATIN CALCIUM 20 MG PO TABS
40.0000 mg | ORAL_TABLET | Freq: Every day | ORAL | Status: DC
  Administered 2018-03-04 (×2): 40 mg via ORAL
  Filled 2018-03-03 (×2): qty 2

## 2018-03-03 MED ORDER — METFORMIN HCL 500 MG PO TABS
500.0000 mg | ORAL_TABLET | Freq: Every day | ORAL | Status: DC
  Filled 2018-03-03: qty 1

## 2018-03-03 MED ORDER — POLYETHYL GLYCOL-PROPYL GLYCOL 0.4-0.3 % OP SOLN: 1.0000 [drp] | Freq: Every day | OPHTHALMIC | Status: DC

## 2018-03-03 MED ORDER — IOPAMIDOL (ISOVUE-300) INJECTION 61%
75.0000 mL | Freq: Once | INTRAVENOUS | Status: AC | PRN
  Administered 2018-03-03: 75 mL via INTRAVENOUS

## 2018-03-03 MED ORDER — FLUTICASONE PROPIONATE 50 MCG/ACT NA SUSP
1.0000 | Freq: Every day | NASAL | Status: DC
  Administered 2018-03-04: 1 via NASAL
  Filled 2018-03-03 (×2): qty 16

## 2018-03-03 MED ORDER — CALCIUM CARBONATE-VITAMIN D 500-200 MG-UNIT PO TABS
1.0000 | ORAL_TABLET | Freq: Every day | ORAL | Status: DC
  Administered 2018-03-04 – 2018-03-05 (×2): 1 via ORAL
  Filled 2018-03-03 (×3): qty 1

## 2018-03-03 MED ORDER — PANTOPRAZOLE SODIUM 40 MG PO TBEC
40.0000 mg | DELAYED_RELEASE_TABLET | Freq: Every day | ORAL | Status: DC
  Administered 2018-03-04 – 2018-03-05 (×2): 40 mg via ORAL
  Filled 2018-03-03 (×2): qty 1

## 2018-03-03 MED ORDER — TIOTROPIUM BROMIDE MONOHYDRATE 18 MCG IN CAPS
18.0000 ug | ORAL_CAPSULE | Freq: Every day | RESPIRATORY_TRACT | Status: DC
Start: 2018-03-04 — End: 2018-03-05
  Administered 2018-03-04: 18 ug via RESPIRATORY_TRACT
  Filled 2018-03-03 (×2): qty 5

## 2018-03-03 MED ORDER — ASPIRIN EC 81 MG PO TBEC
81.0000 mg | DELAYED_RELEASE_TABLET | Freq: Every day | ORAL | Status: DC
  Administered 2018-03-04 – 2018-03-05 (×2): 81 mg via ORAL
  Filled 2018-03-03 (×2): qty 1

## 2018-03-03 MED ORDER — LEVOTHYROXINE SODIUM 100 MCG PO TABS
200.0000 ug | ORAL_TABLET | Freq: Every day | ORAL | Status: DC
  Administered 2018-03-04 – 2018-03-05 (×2): 200 ug via ORAL
  Filled 2018-03-03: qty 4
  Filled 2018-03-03: qty 2

## 2018-03-03 MED ORDER — CARBAMAZEPINE 200 MG PO TABS
200.0000 mg | ORAL_TABLET | Freq: Three times a day (TID) | ORAL | Status: DC
  Administered 2018-03-04 – 2018-03-05 (×5): 200 mg via ORAL
  Filled 2018-03-03 (×6): qty 1

## 2018-03-03 NOTE — ED Notes (Signed)
Pt had med change of Tegritol that has changed to 3x a day. 8am, 1600 0000.

## 2018-03-03 NOTE — ED Notes (Addendum)
Pt ambulatory with walker to toilet with this RN, but states she is weak and cannot stand, although no difficulty noted by this RN. Pt states is unable to clean herself after BM, cleaned by this RN and Judson Roch, EDT and placed back in ED stretcher by same staff listed. Pt given sandwich tray and water, eating without difficulty. Pt states she needs blankets and is ready for bed.   Son states pt not safe to go home by self, pt also feels the same.   Per [place] verbal order, PT and SW consults placed. Home meds ordered.

## 2018-03-03 NOTE — ED Notes (Signed)
ED Provider at bedside. 

## 2018-03-03 NOTE — Discharge Instructions (Addendum)
Return to the emergency department for new, worsening, or persistent severe pain, weakness in the lower extremities, difficulty walking, or any other new or worsening symptoms that concern you.  Monitor blood pressure daily and keep a log for primary care physician to review

## 2018-03-03 NOTE — ED Notes (Signed)
Urine sent at this time from clean catch via I&O cath.

## 2018-03-03 NOTE — ED Triage Notes (Signed)
Pt presents today via [facility] for pelvic pain. Pt's HHRN called EMS pt was unable to get up and walk. Pt usually ambulatory with walker. Pt rides stationary bike 4 miles a day and pt is unable to ride this afternoon. Pt is HOH and denies falling at all. Pt states pain is in pelvic area.

## 2018-03-03 NOTE — ED Notes (Signed)
Charge RN informed of need for hospital bed, states will add to orderly list.

## 2018-03-03 NOTE — ED Notes (Signed)
Patient transported to CT 

## 2018-03-03 NOTE — ED Provider Notes (Signed)
[facility] Emergency Department Provider Note ____________________________________________   First MD Initiated Contact with Patient 03/03/18 1643     (approximate)  I have reviewed the triage vital signs and the nursing notes.   HISTORY  Chief Complaint Abdominal Pain    HPI Jessica Erickson is a 84 y.o. female with past medical history as noted below who presents with pelvic pain, acute onset this morning, associated with difficulty walking, and not associated with diarrhea or urinary symptoms.  Patient does report one episode of vomiting this morning.  No prior history of this pain.  Family members states that the patient is on Tegretol for neuropathy and has occasionally had some weakness related to this.  She also has prior history of UTIs a few years ago.  Past Medical History:  Diagnosis Date  . Asthma   . Bilateral swelling of feet    and legs  . CAD (coronary artery disease)   . Compression fracture of body of thoracic vertebra (HCC)    T12 09/18/15 MRI s/p fall   . Constipation   . COPD (chronic obstructive pulmonary disease) ([place])    previous CXR with chronic interstitial lung dz   . Diabetes ([place])    with neuropathy  . DVT (deep venous thrombosis) ([place])    right leg 10/2015 was on coumadin off as of 2017/2018 ; s/p IVC filter  . Fatty liver    09/15/15 also mildly dilated pancreatitic duct rec MRCP small sub cm cyst hemangioma speeln mild right hydronephrorossi and prox. hydroureter, kidney stones, mild scarring kidneys  . GERD (gastroesophageal reflux disease)    with small hiatal hernia   . Hard of hearing   . History of kidney problems   . Hyperlipidemia   . Hypertension   . Impaired mobility and ADLs    uses rolling walker has caretaker 24/7 at home  . Leg edema   . Neuropathy   . Osteoarthritis    DDD spine   . Pulmonary embolism ([place])    10/2015 off coumadin as of 04/2016  . Pulmonary HTN (HCC)    mild pulm HTN, echo 10/09/15  EF 99-37%JIRCV 1 dd, RV systolic pressure increased   . Recurrent UTI   . Recurrent UTI   . Skin cancer    BCC jawline and scalp   . Thyroid disease    follows [facility]  . TIA (transient ischemic attack)    MRI 2009/2010 neg stroke   . Trigeminal neuralgia    Dr. Tomi Bamberger s/p gamma knife x 2, on Tegretol since 2011/2012 no increase in dose >200 mg bid rec per family per neurology   . Urinary, incontinence, stress female    Dr Erlene Quan urology     Patient Active Problem List   Diagnosis Date Noted  . COPD (chronic obstructive pulmonary disease) ([place]) 12/13/2017  . Anxiety and depression 12/13/2017  . Trigeminal neuralgia 12/13/2017  . GERD (gastroesophageal reflux disease) 12/13/2017  . Diabetes mellitus type 2, controlled ([place]) 12/13/2017  . Constipation 12/13/2017  . Insomnia 12/13/2017  . Hypothyroidism 12/13/2017  . UTI (urinary tract infection) 10/08/2015  . Collapsed vertebra, not elsewhere classified, thoracic region, initial encounter for fracture ([place]) 09/20/2015  . Basal cell carcinoma of scalp 04/06/2014  . Fothergill's neuralgia 08/05/2013  . Bladder infection, chronic 02/11/2013  . Female genuine stress incontinence 02/11/2013  . Incomplete bladder emptying 02/11/2013  . Intrinsic sphincter deficiency 02/11/2013  . Mixed incontinence 02/11/2013  . Excessive urination at night  02/11/2013  . Bladder retention 02/11/2013  . FOM (frequency of micturition) 02/11/2013  . Basal cell carcinoma of face 09/13/2011    Past Surgical History:  Procedure Laterality Date  . APPENDECTOMY    . BRAIN SURGERY     schwnnoma removal 1996   . brain tumor surgery    . BREAST SURGERY     breast bx  . CHOLECYSTECTOMY    . EYE SURGERY     cataract  . IVC FILTER PLACEMENT ([facility] HX)     Dr. Lucky Cowboy 10/2015   . MOHS SURGERY     scalp 04/2014   . PERIPHERAL VASCULAR CATHETERIZATION N/A 10/11/2015   Procedure: IVC Filter Insertion;  Surgeon: Algernon Huxley, MD;  Location: [place] CV LAB;  Service: Cardiovascular;  Laterality: N/A;  . PUBOVAGINAL SLING    . THROAT SURGERY    . THYROID SURGERY    . THYROID SURGERY     tumor around vocal cords   . TOOTH EXTRACTION     winter 2018   . TUBAL LIGATION      Prior to Admission medications   Medication Sig Start Date End Date Taking? Authorizing Provider  acetaminophen (TYLENOL) 325 MG tablet Take 2 tablets (650 mg total) by mouth every 6 (six) hours as needed. Patient taking differently: Take 325 mg by mouth at bedtime.  09/15/15  Yes Carrie Mew, MD  aspirin EC 81 MG tablet Take 81 mg by mouth daily.    Yes [provider]  atorvastatin (LIPITOR) 40 MG tablet Take 40 mg by mouth daily at 6 PM.    Yes [provider]  budesonide-formoterol (SYMBICORT) 160-4.5 MCG/ACT inhaler Inhale 2 puffs into the lungs 2 (two) times daily. 01/31/18  Yes McLean-Scocuzza, Nino Glow, MD  Calcium Carbonate-Vitamin D (CALCIUM 600+D) 600-400 MG-UNIT tablet Take 1 tablet by mouth daily.   Yes [provider]  carbamazepine (TEGRETOL) 200 MG tablet Take 1 tablet (200 mg total) by mouth 3 (three) times daily. 02/15/18  Yes McLean-Scocuzza, Nino Glow, MD  cetirizine (ZYRTEC) 10 MG tablet Take 10 mg by mouth daily.    Yes [provider]  fluticasone (FLONASE) 50 MCG/ACT nasal spray Place 1 spray into both nostrils daily.   Yes [provider]  fosinopril (MONOPRIL) 10 MG tablet Take 10 mg by mouth daily.   Yes [provider]  furosemide (LASIX) 20 MG tablet Take 20 mg by mouth daily as needed for fluid or edema.    Yes [provider]  levothyroxine (SYNTHROID, LEVOTHROID) 200 MCG tablet Daily before breakfast Monday-Saturday. DO NOT TAKE ON SUNDAYS 02/05/18  Yes McLean-Scocuzza, Nino Glow, MD  MELATONIN PO Take 1 tablet by mouth at bedtime as needed (SLEEP).   Yes [provider]  metFORMIN (GLUCOPHAGE) 500 MG tablet Take 1 tablet (500 mg total) by mouth daily with  breakfast. 02/19/18  Yes McLean-Scocuzza, Nino Glow, MD  Multiple Vitamin (MULTIVITAMIN WITH MINERALS) TABS tablet Take 1 tablet by mouth daily.   Yes [provider]  pantoprazole (PROTONIX) 40 MG tablet Take 1 tablet (40 mg total) by mouth daily. 12/27/17  Yes McLean-Scocuzza, Nino Glow, MD  Polyethyl Glycol-Propyl Glycol 0.4-0.3 % SOLN Apply 1 drop to eye at bedtime.    Yes [provider]  polyethylene glycol powder (GLYCOLAX/MIRALAX) powder Take 17 g by mouth daily as needed for mild constipation.  12/10/15  Yes [provider]  sucralfate (CARAFATE) 1 GM/10ML suspension Take 10 mLs (1 g total) by mouth  4 (four) times daily -  with meals and at bedtime. 12/18/17  Yes McLean-Scocuzza, Nino Glow, MD  tiotropium (SPIRIVA) 18 MCG inhalation capsule Place 1 capsule (18 mcg total) into inhaler and inhale daily. 01/10/18  Yes McLean-Scocuzza, Nino Glow, MD  acetaminophen-codeine (TYLENOL #3) 300-30 MG tablet Take 1 tablet by mouth daily as needed for moderate pain. Patient not taking: Reported on 03/03/2018 12/10/17   McLean-Scocuzza, Nino Glow, MD  budesonide-formoterol [facility]) 160-4.5 MCG/ACT inhaler Inhale 2 puffs into the lungs 2 (two) times daily. Rinse mouth Patient not taking: Reported on 03/03/2018 02/07/18   McLean-Scocuzza, Nino Glow, MD  ciprofloxacin (CIPRO) 500 MG tablet Take 1 tablet (500 mg total) by mouth 2 (two) times daily. With food 12/27/17   McLean-Scocuzza, Nino Glow, MD    Allergies Penicillins; Sulfa antibiotics; Amitiza [lubiprostone]; Aspirin; and Penicillin g  Family History  Problem Relation Age of Onset  . Heart disease Mother   . Diabetes Father   . Cancer Daughter        breast ca x 2 s/p mastectomy     Social History Social History   Tobacco Use  . Smoking status: Former Research scientist (life sciences)  . Smokeless tobacco: Never Used  . Tobacco comment: quit 1996 smoked 20 years max 8 cig qd   Substance Use Topics  . Alcohol use: No  . Drug use: No    Review of  Systems  Constitutional: No fever. Eyes: No visual changes. ENT: No sore throat. Cardiovascular: Denies chest pain. Respiratory: Denies shortness of breath. Gastrointestinal: No diarrhea..  Genitourinary: Negative for dysuria or hematuria.  Musculoskeletal: Negative for back pain. Skin: Negative for rash. Neurological: Negative for focal weakness or numbness.   ____________________________________________   PHYSICAL EXAM:  VITAL SIGNS: ED Triage Vitals  Enc Vitals Group     BP 03/03/18 1637 120/60     Pulse Rate 03/03/18 1637 79     Resp 03/03/18 1637 18     Temp 03/03/18 1637 98.3 F (36.8 C)     Temp Source 03/03/18 1637 Oral     SpO2 03/03/18 1637 99 %     Weight 03/03/18 1638 199 lb 11.2 oz (90.6 kg)     Height 03/03/18 1638 5\' 5"  (1.651 m)     Head Circumference --      Peak Flow --      Pain Score 03/03/18 1638 3     Pain Loc --      Pain Edu? --      Excl. in [place]? --     Constitutional: Alert and oriented. Well appearing for age and in no acute distress. Eyes: Conjunctivae are normal.  Head: Atraumatic. Nose: No congestion/rhinnorhea. Mouth/Throat: Mucous membranes are moist.   Neck: Normal range of motion.  Cardiovascular: Good peripheral circulation. Respiratory: Normal respiratory effort.   Gastrointestinal: Soft and nontender. No distention.  Genitourinary: No flank tenderness. Musculoskeletal: Full range of motion of bilateral hips.  No deformity.  No focal bony tenderness. Neurologic: 5/5 motor strength and sensory intact in bilateral lower extremities. Skin:  Skin is warm and dry. No rash noted. Psychiatric: Mood and affect are normal. Speech and behavior are normal.  ____________________________________________   LABS (all labs ordered are listed, but only abnormal results are displayed)  Labs Reviewed  COMPREHENSIVE METABOLIC PANEL - Abnormal; Notable for the following components:      Result Value   Glucose, Bld 135 (*)    Creatinine, Ser  1.16 (*)    Calcium 8.8 (*)  GFR calc non Af Amer 40 (*)    GFR calc Af Amer 47 (*)    All other components within normal limits  URINALYSIS, COMPLETE (UACMP) WITH MICROSCOPIC - Abnormal; Notable for the following components:   Color, Urine AMBER (*)    APPearance HAZY (*)    Protein, ur 30 (*)    Squamous Epithelial / LPF 0-5 (*)    All other components within normal limits  CBC WITH DIFFERENTIAL/PLATELET  LIPASE, BLOOD  CARBAMAZEPINE LEVEL, TOTAL   ____________________________________________  EKG  ED ECG REPORT I, Arta Silence, the attending physician, personally viewed and interpreted this ECG.  Date: 03/03/2018 EKG Time: 1637 Rate: 79 nonspecific IVCD Rhythm: normal sinus rhythm QRS Axis: normal Intervals: Nonspecific IVCD ST/T Wave abnormalities: normal Narrative Interpretation: no evidence of acute ischemia  ____________________________________________  RADIOLOGY  XR pelvis: No acute fracture  CT abdomen/pelvis: No acute findings  ____________________________________________   PROCEDURES  Procedure(s) performed: No  Procedures  Critical Care performed: No ____________________________________________   INITIAL IMPRESSION / ASSESSMENT AND PLAN / ED COURSE  Pertinent labs & imaging results that were available during my care of the patient were reviewed by me and considered in my medical decision making (see chart for details).  84 year old female with past medical history as noted above including her neuropathy related to an old lumbar spine fracture presents with pelvic area pain, acute onset this morning, associated with one episode of vomiting, and with inability to ambulate.  No weakness or numbness.  On exam, the patient has full range of motion at both hips, no deformity, no bony tenderness, and no abdominal tenderness.  She is overall relatively well-appearing and vital signs are normal.  Differential includes UTI, occult trauma,  weakness as a side effect of the Tegretol (which patient has had previously), or less likely colitis, diverticulitis, or other intra-abdominal process.  Plan: UA, basic labs, Tegretol level, pelvis x-ray.  If this initial workup is not revealing, consider CT abdomen although I have a lower suspicion for intra-abdominal process given that the patient has no tenderness there.    ----------------------------------------- 8:48 PM on 03/03/2018 -----------------------------------------  Initial lab workup, UA, and x-ray were negative for acute findings.  We proceeded with CT, which is also negative.  I performed an external pelvic exam, and confirmed no tenderness, masses, or lesions.  For the pelvic pain, given the negative workup, there is no indication for further evaluation emergently.  The patient's son is concerned that although the patient has 24-hour home care, her caregiver is also somewhat elderly and would not be able to help the patient up if the patient would fall or cannot walk.  We will do a trial of ambulation.  The patient is able to ambulate, we will discharge home.  If she is unable to ambulate, she will stay for PT/SW eval in the morning.  I am signing out to the oncoming physician Dr. Archie Balboa.   ____________________________________________   FINAL CLINICAL IMPRESSION(S) / ED DIAGNOSES  Final diagnoses:  Pelvic pain      NEW MEDICATIONS STARTED DURING THIS VISIT:  New Prescriptions   No medications on file     Note:  This document was prepared using Dragon voice recognition software and may include unintentional dictation errors.    Arta Silence, MD 03/03/18 2050

## 2018-03-04 ENCOUNTER — Encounter: Payer: Self-pay | Admitting: Neurology

## 2018-03-04 ENCOUNTER — Other Ambulatory Visit: Payer: Self-pay

## 2018-03-04 DIAGNOSIS — E785 Hyperlipidemia, unspecified: Secondary | ICD-10-CM | POA: Diagnosis not present

## 2018-03-04 DIAGNOSIS — I272 Pulmonary hypertension, unspecified: Secondary | ICD-10-CM | POA: Diagnosis not present

## 2018-03-04 DIAGNOSIS — I10 Essential (primary) hypertension: Secondary | ICD-10-CM | POA: Diagnosis not present

## 2018-03-04 DIAGNOSIS — H9190 Unspecified hearing loss, unspecified ear: Secondary | ICD-10-CM | POA: Diagnosis not present

## 2018-03-04 DIAGNOSIS — Z85828 Personal history of other malignant neoplasm of skin: Secondary | ICD-10-CM | POA: Diagnosis not present

## 2018-03-04 DIAGNOSIS — I951 Orthostatic hypotension: Secondary | ICD-10-CM | POA: Diagnosis present

## 2018-03-04 DIAGNOSIS — E1140 Type 2 diabetes mellitus with diabetic neuropathy, unspecified: Secondary | ICD-10-CM | POA: Diagnosis not present

## 2018-03-04 DIAGNOSIS — N179 Acute kidney failure, unspecified: Secondary | ICD-10-CM | POA: Diagnosis not present

## 2018-03-04 DIAGNOSIS — E860 Dehydration: Secondary | ICD-10-CM | POA: Diagnosis not present

## 2018-03-04 DIAGNOSIS — K219 Gastro-esophageal reflux disease without esophagitis: Secondary | ICD-10-CM | POA: Diagnosis not present

## 2018-03-04 DIAGNOSIS — Z86718 Personal history of other venous thrombosis and embolism: Secondary | ICD-10-CM | POA: Diagnosis not present

## 2018-03-04 DIAGNOSIS — Z86711 Personal history of pulmonary embolism: Secondary | ICD-10-CM | POA: Diagnosis not present

## 2018-03-04 DIAGNOSIS — I2510 Atherosclerotic heart disease of native coronary artery without angina pectoris: Secondary | ICD-10-CM | POA: Diagnosis not present

## 2018-03-04 DIAGNOSIS — R42 Dizziness and giddiness: Secondary | ICD-10-CM

## 2018-03-04 DIAGNOSIS — M1990 Unspecified osteoarthritis, unspecified site: Secondary | ICD-10-CM | POA: Diagnosis not present

## 2018-03-04 DIAGNOSIS — J449 Chronic obstructive pulmonary disease, unspecified: Secondary | ICD-10-CM | POA: Diagnosis not present

## 2018-03-04 DIAGNOSIS — K760 Fatty (change of) liver, not elsewhere classified: Secondary | ICD-10-CM | POA: Diagnosis not present

## 2018-03-04 DIAGNOSIS — Z8673 Personal history of transient ischemic attack (TIA), and cerebral infarction without residual deficits: Secondary | ICD-10-CM | POA: Diagnosis not present

## 2018-03-04 DIAGNOSIS — Z87440 Personal history of urinary (tract) infections: Secondary | ICD-10-CM | POA: Diagnosis not present

## 2018-03-04 LAB — CBC WITH DIFFERENTIAL/PLATELET
Basophils Absolute: 0 10*3/uL (ref 0–0.1)
Basophils Relative: 0 %
Eosinophils Absolute: 0.1 10*3/uL (ref 0–0.7)
Eosinophils Relative: 1 %
HCT: 38.7 % (ref 35.0–47.0)
Hemoglobin: 12.6 g/dL (ref 12.0–16.0)
Lymphocytes Relative: 15 %
Lymphs Abs: 1.1 10*3/uL (ref 1.0–3.6)
MCH: 31 pg (ref 26.0–34.0)
MCHC: 32.7 g/dL (ref 32.0–36.0)
MCV: 95 fL (ref 80.0–100.0)
Monocytes Absolute: 0.7 10*3/uL (ref 0.2–0.9)
Monocytes Relative: 10 %
Neutro Abs: 5.5 10*3/uL (ref 1.4–6.5)
Neutrophils Relative %: 74 %
Platelets: 126 10*3/uL — ABNORMAL LOW (ref 150–440)
RBC: 4.07 MIL/uL (ref 3.80–5.20)
RDW: 14.2 % (ref 11.5–14.5)
WBC: 7.5 10*3/uL (ref 3.6–11.0)

## 2018-03-04 LAB — BASIC METABOLIC PANEL
Anion gap: 5 (ref 5–15)
BUN: 20 mg/dL (ref 6–20)
CO2: 26 mmol/L (ref 22–32)
Calcium: 8.1 mg/dL — ABNORMAL LOW (ref 8.9–10.3)
Chloride: 107 mmol/L (ref 101–111)
Creatinine, Ser: 1.09 mg/dL — ABNORMAL HIGH (ref 0.44–1.00)
GFR calc Af Amer: 51 mL/min — ABNORMAL LOW (ref >60)
GFR calc non Af Amer: 44 mL/min — ABNORMAL LOW (ref >60)
Glucose, Bld: 139 mg/dL — ABNORMAL HIGH (ref 65–99)
Potassium: 4.3 mmol/L (ref 3.5–5.1)
Sodium: 138 mmol/L (ref 135–145)

## 2018-03-04 LAB — CARBAMAZEPINE LEVEL, TOTAL
Carbamazepine Lvl: 6.6 ug/mL (ref 4.0–12.0)
Carbamazepine Lvl: 8.4 ug/mL (ref 4.0–12.0)

## 2018-03-04 LAB — TROPONIN I: Troponin I: <0.03 ng/mL (ref <0.03)

## 2018-03-04 MED ORDER — DOCUSATE SODIUM 100 MG PO CAPS
100.0000 mg | ORAL_CAPSULE | Freq: Two times a day (BID) | ORAL | Status: DC
  Administered 2018-03-04 – 2018-03-05 (×2): 100 mg via ORAL
  Filled 2018-03-04 (×3): qty 1

## 2018-03-04 MED ORDER — SODIUM CHLORIDE 0.9 % IV SOLN
Freq: Once | INTRAVENOUS | Status: AC
  Administered 2018-03-04: 11:00:00 via INTRAVENOUS

## 2018-03-04 MED ORDER — ONDANSETRON HCL 4 MG/2ML IJ SOLN: 4.0000 mg | Freq: Four times a day (QID) | INTRAMUSCULAR | Status: DC | PRN

## 2018-03-04 MED ORDER — BISACODYL 5 MG PO TBEC: 5.0000 mg | DELAYED_RELEASE_TABLET | Freq: Every day | ORAL | Status: DC | PRN

## 2018-03-04 MED ORDER — HYDROCODONE-ACETAMINOPHEN 5-325 MG PO TABS: 1.0000 | ORAL_TABLET | ORAL | Status: DC | PRN

## 2018-03-04 MED ORDER — HEPARIN SODIUM (PORCINE) 5000 UNIT/ML IJ SOLN
5000.0000 [IU] | Freq: Three times a day (TID) | INTRAMUSCULAR | Status: DC
  Administered 2018-03-05: 5000 [IU] via SUBCUTANEOUS
  Filled 2018-03-04: qty 1

## 2018-03-04 MED ORDER — TRAZODONE HCL 50 MG PO TABS: 25.0000 mg | ORAL_TABLET | Freq: Every evening | ORAL | Status: DC | PRN

## 2018-03-04 MED ORDER — ONDANSETRON HCL 4 MG PO TABS: 4.0000 mg | ORAL_TABLET | Freq: Four times a day (QID) | ORAL | Status: DC | PRN

## 2018-03-04 MED ORDER — ACETAMINOPHEN 325 MG PO TABS: 650.0000 mg | ORAL_TABLET | Freq: Four times a day (QID) | ORAL | Status: DC | PRN

## 2018-03-04 MED ORDER — ACETAMINOPHEN 650 MG RE SUPP: 650.0000 mg | Freq: Four times a day (QID) | RECTAL | Status: DC | PRN

## 2018-03-04 MED ORDER — SODIUM CHLORIDE 0.9 % IV SOLN
Freq: Once | INTRAVENOUS | Status: AC
  Administered 2018-03-04: via INTRAVENOUS

## 2018-03-04 MED ORDER — SODIUM CHLORIDE 0.9 % IV BOLUS
1000.0000 mL | Freq: Once | INTRAVENOUS | Status: AC
  Administered 2018-03-04: 1000 mL via INTRAVENOUS

## 2018-03-04 NOTE — ED Notes (Signed)
Pt given meal tray.

## 2018-03-04 NOTE — ED Provider Notes (Addendum)
-----------------------------------------   2:24 PM on 03/04/2018 -----------------------------------------  Patient has been evaluated by PT and is now able to ambulate adequately.  She has also been evaluated by social work who has made arrangements to set up for home care.  The family is comfortable with discharge home at this time.  The patient is stable for discharge.     ----------------------------------------- 6:49 PM on 03/04/2018 -----------------------------------------  Patient had orthostatic hypotension to as low as the high 80s when she stood up.  We gave her fluids as I presumed that this was most likely due to her prolonged time in the bed and decreased p.o. intake, but after 2 L of NS, she remains with blood pressure 90/40 when she stands, and feels dizzy and appears wobbly.  At this time, the patient is not appropriate for discharge home.  I will obtain repeat labs, since her last labs were yesterday, and likely admit for orthostatic hypotension.  ----------------------------------------- 8:37 PM on 03/04/2018 -----------------------------------------  Lab workup unchanged.  We will proceed with admission.  I signed the patient out to the hospitalist Dr. Anselm Jungling.   Arta Silence, MD 03/04/18 2045

## 2018-03-04 NOTE — ED Notes (Signed)
Patient is resting comfortably at this time with no signs of distress present. Equal, unlabored rise and fall of chest noted within normal rate. Will continue to monitor.   

## 2018-03-04 NOTE — ED Notes (Signed)
Pt ambulated in room with rolling walker. Pt able to sit upright in bed, stand, and walk without assistance. Pt states she feels much better and would like to go home. BP after ambulation 90/41, pt still has approx. 366mL NS left to infuse. SW going to speak with patient and family now. EDP Siadecki updated.

## 2018-03-04 NOTE — Evaluation (Signed)
Physical Therapy Evaluation Patient Details Name: Jessica Erickson MRN: [id number] DOB: 1926/02/20 Today's Date: 03/04/2018   History of Present Illness  presented to ER secondary to pelvic pain, inability to ambulate.  Abdominal CT unremarkable for acute injury/process.  Pain improved per patient report, but continues to express inability to walk.  Clinical Impression  Upon evaluation, patient alert and oriented to basic information; follows all commands, but extremely HOH (even with R hearing aide in place). Bilat UE/LE strength and ROM grossly symmetrical and WFL; no focal weakness, very minimal pain reported at this time. Demonstrates ability to complete supine to sit with close sup; sit/stand (requiring multiple attempts), basic transfers and short-distance gait (5') with RW, cga/min assist.  After 5' of forward movement, patient spontaneously reporting need to sit "I can't walk anymore; I'm going to fall"; insistent upon return to bed (releasing grasp of RW in process, holding to therapist, bed frame for return).  Patient noted slightly pale with mild increase in response time noted.   Seated BP assessed at 69/52, HR 48; immediately returned to supine with improvement to BP 82/50, HR 74 and supine x5 minutes BP 125/59.  Symptoms fully resolved once supine.  Suspect significant orthostatic hypotension with transition to upright, likely contributing to LE weakness, immobility and significant fear of falling. Would benefit from skilled PT to address above deficits and promote optimal return to PLOF; Recommend transition to HHP with continued 24 hour sup/assist upon discharge from acute hospitalization (once medically stabilized and appropriate).     Follow Up Recommendations Home health PT;Supervision/Assistance - 24 hour(once medically stabilized/appropriate (orthostasis resolved))    Equipment Recommendations  (has 4WRW at home)    Recommendations for Other Services       Precautions /  Restrictions Precautions Precautions: Fall Restrictions Weight Bearing Restrictions: No      Mobility  Bed Mobility Overal bed mobility: Needs Assistance Bed Mobility: Supine to Sit;Sit to Supine     Supine to sit: Supervision Sit to supine: Min assist      Transfers Overall transfer level: Needs assistance Equipment used: Rolling walker (2 wheeled) Transfers: Sit to/from Stand Sit to Stand: Min guard;Min assist         General transfer comment: multiple attempts to complete; requires UE support to assist with movement transition  Ambulation/Gait Ambulation/Gait assistance: Min guard;Min assist Ambulation Distance (Feet): 5 Feet Assistive device: Rolling walker (2 wheeled)       General Gait Details: forward flexed posture, decreased step height/length bilat, decreased cadence/balance reactions.  After 5' of forward movement, patient spontaneously reporting need to sit "I can't walk anymore; I'm going to fall"; insistent upon return to bed (releasing grasp of RW in process, holding to therapist, bed frame for return).  Stairs            Wheelchair Mobility    Modified Rankin (Stroke Patients Only)       Balance Overall balance assessment: Needs assistance Sitting-balance support: No upper extremity supported;Feet supported Sitting balance-Leahy Scale: Good     Standing balance support: Bilateral upper extremity supported Standing balance-Leahy Scale: Poor                               Pertinent Vitals/Pain Pain Assessment: No/denies pain    Home Living Family/patient expects to be discharged to:: Private residence Living Arrangements: Alone Available Help at Discharge: Family;Personal care attendant;Available 24 hours/day(has two caregivers that split 24/7 assist within the home)  Type of Home: Apartment Home Access: Level entry     Home Layout: One level Home Equipment: [place] - 4 wheels;Shower seat(raised toilet height)       Prior Function Level of Independence: Needs assistance         Comments: Mod indep for basic transfers and household ambulation with 4WRW; assist from caregiver for ADLs (gets in walk-in shower with seat 3x/week).  Rides stationary bike 1 mile/episode, 4x/week.  Denies additional fall history.  Caregivers responsible for additional household responsibilites/chores.     Hand Dominance        Extremity/Trunk Assessment   Upper Extremity Assessment Upper Extremity Assessment: Overall WFL for tasks assessed    Lower Extremity Assessment Lower Extremity Assessment: Overall WFL for tasks assessed(grossly at least 4-/5 throughout)       Communication   Communication: HOH(hearing aide in R ear)  Cognition Arousal/Alertness: Awake/alert Behavior During Therapy: Anxious(very fearful of falling) Overall Cognitive Status: Within Functional Limits for tasks assessed                                        General Comments      Exercises     Assessment/Plan    PT Assessment Patient needs continued PT services  PT Problem List Decreased activity tolerance;Decreased balance;Decreased mobility;Decreased coordination;Decreased cognition;Decreased knowledge of use of DME;Decreased safety awareness;Decreased knowledge of precautions;Cardiopulmonary status limiting activity       PT Treatment Interventions DME instruction;Gait training;Functional mobility training;Therapeutic activities;Therapeutic exercise;Balance training;Patient/family education    PT Goals (Current goals can be found in the Care Plan section)  Acute Rehab PT Goals Patient Stated Goal: to be able to walk again PT Goal Formulation: With patient/family Time For Goal Achievement: 03/18/18 Potential to Achieve Goals: Good    Frequency Min 2X/week   Barriers to discharge Decreased caregiver support      Co-evaluation               AM-PAC PT "6 Clicks" Daily Activity  Outcome Measure  Difficulty turning over in bed (including adjusting bedclothes, sheets and blankets)?: A Little Difficulty moving from lying on back to sitting on the side of the bed? : A Little Difficulty sitting down on and standing up from a chair with arms (e.g., wheelchair, bedside commode, etc,.)?: A Little Help needed moving to and from a bed to chair (including a wheelchair)?: A Little Help needed walking in hospital room?: A Lot Help needed climbing 3-5 steps with a railing? : A Lot 6 Click Score: 16    End of Session Equipment Utilized During Treatment: Gait belt Activity Tolerance: Treatment limited secondary to medical complications (Comment)(symptomatic orthostasis) Patient left: in bed;with call bell/phone within reach;with family/visitor present Nurse Communication: Mobility status PT Visit Diagnosis: Unsteadiness on feet (R26.81);Muscle weakness (generalized) (M62.81);Difficulty in walking, not elsewhere classified (R26.2)    Time: 7897-8478 PT Time Calculation (min) (ACUTE ONLY): 22 min   Charges:   PT Evaluation $PT Eval Moderate Complexity: 1 Mod PT Treatments $Therapeutic Activity: 8-22 mins   PT G Codes:        Keedan Sample H. Owens Shark, PT, DPT, NCS 03/04/18, 10:03 AM [phone]

## 2018-03-04 NOTE — Care Management Note (Signed)
Case Management Note  Patient Details  Name: SUSANNE BAUMGARNER MRN: [id number] Date of Birth: [birth year]  Subjective/Objective:                  RNCM met with patient and several family members to discuss transition to home followed by home health.  Patient's PCP is Dr. Terese Door.  She would like to use [facility] time. She has used [facility] and was not happy with their service.  She ambulates with a walker at baseline. She and family state that she will have 24/7 care arranged for in the home.    Action/Plan: Home health list provided. Corene Cornea with [facility] updated. No other RNCM needs.  Expected Discharge Date:                  Expected Discharge Plan:     In-House Referral:     Discharge planning Services  CM Consult  Post Acute Care Choice:  Home Health Choice offered to:  Patient, Adult Children  DME Arranged:    DME Agency:     HH Arranged:  PT [place]:  [place]  Status of Service:  Completed, signed off  If discussed at [place] of Stay Meetings, dates discussed:    Additional Comments:  Marshell Garfinkel, RN 03/04/2018, 2:26 PM

## 2018-03-04 NOTE — ED Notes (Signed)
Pt switched from ED stretcher to inpatient bed.

## 2018-03-04 NOTE — H&P (Signed)
[facility] [place] NAME: Jessica Erickson    MR#:  [id number]  DATE OF BIRTH:  Jan 15, 1926  DATE OF ADMISSION:  03/03/2018  PRIMARY CARE PHYSICIAN: McLean-Scocuzza, Nino Glow, MD   REQUESTING/REFERRING PHYSICIAN:   CHIEF COMPLAINT:   Chief Complaint  Patient presents with  . Abdominal Pain    HISTORY OF PRESENT ILLNESS: Jessica Erickson  is a 84 y.o. female with a known history of hypertension, coronary artery disease, osteoarthritis, diabetes, hearing deficit and many other comorbidities. Patient was brought to emergency room for severe bilateral pelvic pain when walking, that started acutely, in the morning.  The pain was sharp, without any radiation.  There was some nausea associated with the pain and one episode of vomiting, earlier in the day at home.  No similar episodes in the past.  Her symptoms resolved while in the emergency room, however she she is still weak with orthostatic hypotension, lightheadedness and unstable gait. Blood test done in emergency room are essentially within normal limits except for mildly elevated creatinine level at 1.16.  Abdomen and pelvis CAT scan, and pelvis x-ray reviewed by myself, are negative for any acute abnormalities.  EKG is noted without any acute ST-T changes. Patient is admitted for further evaluation and treatment.  PAST MEDICAL HISTORY:   Past Medical History:  Diagnosis Date  . Asthma   . Bilateral swelling of feet    and legs  . CAD (coronary artery disease)   . Compression fracture of body of thoracic vertebra (HCC)    T12 09/18/15 MRI s/p fall   . Constipation   . COPD (chronic obstructive pulmonary disease) ([place])    previous CXR with chronic interstitial lung dz   . Diabetes ([place])    with neuropathy  . DVT (deep venous thrombosis) ([place])    right leg 10/2015 was on coumadin off as of 2017/2018 ; s/p IVC filter  . Fatty liver    09/15/15 also mildly dilated pancreatitic duct rec MRCP  small sub cm cyst hemangioma speeln mild right hydronephrorossi and prox. hydroureter, kidney stones, mild scarring kidneys  . GERD (gastroesophageal reflux disease)    with small hiatal hernia   . Hard of hearing   . History of kidney problems   . Hyperlipidemia   . Hypertension   . Impaired mobility and ADLs    uses rolling walker has caretaker 24/7 at home  . Leg edema   . Neuropathy   . Osteoarthritis    DDD spine   . Pulmonary embolism ([place])    10/2015 off coumadin as of 04/2016  . Pulmonary HTN (HCC)    mild pulm HTN, echo 10/09/15 EF 03-21%YYQMG 1 dd, RV systolic pressure increased   . Recurrent UTI   . Recurrent UTI   . Skin cancer    BCC jawline and scalp   . Thyroid disease    follows [facility]  . TIA (transient ischemic attack)    MRI 2009/2010 neg stroke   . Trigeminal neuralgia    Dr. Tomi Bamberger s/p gamma knife x 2, on Tegretol since 2011/2012 no increase in dose >200 mg bid rec per family per neurology   . Urinary, incontinence, stress female    Dr Erlene Quan urology     PAST SURGICAL HISTORY:  Past Surgical History:  Procedure Laterality Date  . APPENDECTOMY    . BRAIN SURGERY     schwnnoma removal 1996   . brain tumor surgery    .  BREAST SURGERY     breast bx  . CHOLECYSTECTOMY    . EYE SURGERY     cataract  . IVC FILTER PLACEMENT ([facility] HX)     Dr. Lucky Cowboy 10/2015   . MOHS SURGERY     scalp 04/2014   . PERIPHERAL VASCULAR CATHETERIZATION N/A 10/11/2015   Procedure: IVC Filter Insertion;  Surgeon: Algernon Huxley, MD;  Location: [facility];  Service: Cardiovascular;  Laterality: N/A;  . PUBOVAGINAL SLING    . THROAT SURGERY    . THYROID SURGERY    . THYROID SURGERY     tumor around vocal cords   . TOOTH EXTRACTION     winter 2018   . TUBAL LIGATION      SOCIAL HISTORY:  Social History   Tobacco Use  . Smoking status: Former Research scientist (life sciences)  . Smokeless tobacco: Never Used  . Tobacco comment: quit 1996 smoked 20 years max 8 cig qd   Substance Use  Topics  . Alcohol use: No    FAMILY HISTORY:  Family History  Problem Relation Age of Onset  . Heart disease Mother   . Diabetes Father   . Cancer Daughter        breast ca x 2 s/p mastectomy     DRUG ALLERGIES:  Allergies  Allergen Reactions  . Penicillins Shortness Of Breath, Rash and Other (See Comments)    Has patient had a PCN reaction causing immediate rash, facial/tongue/throat swelling, SOB or lightheadedness with hypotension: Yes Has patient had a PCN reaction causing severe rash involving mucus membranes or skin necrosis: No Has patient had a PCN reaction that required hospitalization No Has patient had a PCN reaction occurring within the last 10 years: No If all of the above answers are "NO", then may proceed with Cephalosporin use.  . Sulfa Antibiotics Shortness Of Breath, Rash and Other (See Comments)  . Amitiza [Lubiprostone]     N/v/d  . Aspirin Other (See Comments)    Reaction:  Unknown   . Penicillin G Other (See Comments)    REVIEW OF SYSTEMS:   CONSTITUTIONAL: No fever, fatigue, but positive for generalized weakness.  EYES: No blurred or double vision.  EARS, NOSE, AND THROAT: No tinnitus or ear pain.  RESPIRATORY: No cough, shortness of breath, wheezing or hemoptysis.  CARDIOVASCULAR: No chest pain, orthopnea, edema.  GASTROINTESTINAL: Positive for nausea and vomiting; no diarrhea or abdominal pain.  GENITOURINARY: No dysuria, hematuria.  ENDOCRINE: No polyuria, nocturia,  HEMATOLOGY: No bleeding SKIN: No rash or lesion. MUSCULOSKELETAL: Positive for pelvic pain.   NEUROLOGIC: No focal weakness.  PSYCHIATRY: No anxiety or depression.   MEDICATIONS AT HOME:  Prior to Admission medications   Medication Sig Start Date End Date Taking? Authorizing Provider  acetaminophen (TYLENOL) 325 MG tablet Take 2 tablets (650 mg total) by mouth every 6 (six) hours as needed. Patient taking differently: Take 325 mg by mouth at bedtime.  09/15/15  Yes Carrie Mew, MD  aspirin EC 81 MG tablet Take 81 mg by mouth daily.    Yes [provider]  atorvastatin (LIPITOR) 40 MG tablet Take 40 mg by mouth daily at 6 PM.    Yes [provider]  budesonide-formoterol (SYMBICORT) 160-4.5 MCG/ACT inhaler Inhale 2 puffs into the lungs 2 (two) times daily. 01/31/18  Yes McLean-Scocuzza, Nino Glow, MD  Calcium Carbonate-Vitamin D (CALCIUM 600+D) 600-400 MG-UNIT tablet Take 1 tablet by mouth daily.   Yes [provider]  carbamazepine (TEGRETOL) 200  MG tablet Take 1 tablet (200 mg total) by mouth 3 (three) times daily. 02/15/18  Yes McLean-Scocuzza, Nino Glow, MD  cetirizine (ZYRTEC) 10 MG tablet Take 10 mg by mouth daily.    Yes [provider]  fluticasone (FLONASE) 50 MCG/ACT nasal spray Place 1 spray into both nostrils daily.   Yes [provider]  fosinopril (MONOPRIL) 10 MG tablet Take 10 mg by mouth daily.   Yes [provider]  furosemide (LASIX) 20 MG tablet Take 20 mg by mouth daily as needed for fluid or edema.    Yes [provider]  levothyroxine (SYNTHROID, LEVOTHROID) 200 MCG tablet Daily before breakfast Monday-Saturday. DO NOT TAKE ON SUNDAYS 02/05/18  Yes McLean-Scocuzza, Nino Glow, MD  MELATONIN PO Take 1 tablet by mouth at bedtime as needed (SLEEP).   Yes [provider]  metFORMIN (GLUCOPHAGE) 500 MG tablet Take 1 tablet (500 mg total) by mouth daily with breakfast. 02/19/18  Yes McLean-Scocuzza, Nino Glow, MD  Multiple Vitamin (MULTIVITAMIN WITH MINERALS) TABS tablet Take 1 tablet by mouth daily.   Yes [provider]  pantoprazole (PROTONIX) 40 MG tablet Take 1 tablet (40 mg total) by mouth daily. 12/27/17  Yes McLean-Scocuzza, Nino Glow, MD  Polyethyl Glycol-Propyl Glycol 0.4-0.3 % SOLN Apply 1 drop to eye at bedtime.    Yes [provider]  polyethylene glycol powder (GLYCOLAX/MIRALAX) powder Take 17 g by mouth daily as needed for mild constipation.  12/10/15  Yes  [provider]  sucralfate (CARAFATE) 1 GM/10ML suspension Take 10 mLs (1 g total) by mouth 4 (four) times daily -  with meals and at bedtime. 12/18/17  Yes McLean-Scocuzza, Nino Glow, MD  tiotropium (SPIRIVA) 18 MCG inhalation capsule Place 1 capsule (18 mcg total) into inhaler and inhale daily. 01/10/18  Yes McLean-Scocuzza, Nino Glow, MD  acetaminophen-codeine (TYLENOL #3) 300-30 MG tablet Take 1 tablet by mouth daily as needed for moderate pain. Patient not taking: Reported on 03/03/2018 12/10/17   McLean-Scocuzza, Nino Glow, MD  budesonide-formoterol [facility]) 160-4.5 MCG/ACT inhaler Inhale 2 puffs into the lungs 2 (two) times daily. Rinse mouth Patient not taking: Reported on 03/03/2018 02/07/18   McLean-Scocuzza, Nino Glow, MD  ciprofloxacin (CIPRO) 500 MG tablet Take 1 tablet (500 mg total) by mouth 2 (two) times daily. With food 12/27/17   McLean-Scocuzza, Nino Glow, MD      PHYSICAL EXAMINATION:   VITAL SIGNS: Blood pressure 132/70, pulse 77, temperature 98.2 F (36.8 C), temperature source Oral, resp. rate 17, height 5\' 5"  (1.651 m), weight 90.6 kg (199 lb 11.2 oz), SpO2 96 %.  GENERAL:  84 y.o.-year-old patient lying in the bed with no acute distress.  EYES: Pupils equal, round, reactive to light and accommodation. No scleral icterus. HEENT: Head atraumatic, normocephalic. Oropharynx and nasopharynx clear.  Bilateral hearing deficit is notable. NECK:  Supple, no jugular venous distention. No thyroid enlargement, no tenderness.  LUNGS: Normal breath sounds bilaterally, no wheezing, rales,rhonchi or crepitation. No use of accessory muscles of respiration.  CARDIOVASCULAR: S1, S2 normal. No S3/S4.  ABDOMEN: Soft, nontender, nondistended. Bowel sounds present. No organomegaly or mass.  EXTREMITIES: No pedal edema, cyanosis, or clubbing.  NEUROLOGIC: No focal weakness.  Gait not checked, as patient feels too weak to ambulate.  PSYCHIATRIC: The patient is alert and oriented x 3.  SKIN: No  obvious rash, lesion, or ulcer.   LABORATORY PANEL:   CBC Recent Labs  Lab 03/03/18 1630 03/04/18 1955  WBC 7.3 7.5  HGB 13.8  12.6  HCT 42.7 38.7  PLT 198 126*  MCV 95.7 95.0  MCH 30.9 31.0  MCHC 32.3 32.7  RDW 14.2 14.2  LYMPHSABS 1.4 1.1  MONOABS 0.6 0.7  EOSABS 0.1 0.1  BASOSABS 0.0 0.0   ------------------------------------------------------------------------------------------------------------------  Chemistries  Recent Labs  Lab 03/03/18 1630 03/04/18 1955  NA 140 138  K 4.5 4.3  CL 104 107  CO2 26 26  GLUCOSE 135* 139*  BUN 20 20  CREATININE 1.16* 1.09*  CALCIUM 8.8* 8.1*  AST 26  --   ALT 19  --   ALKPHOS 65  --   BILITOT 0.4  --    ------------------------------------------------------------------------------------------------------------------ estimated creatinine clearance is 38.9 mL/min (A) (by C-G formula based on SCr of 1.09 mg/dL (H)). ------------------------------------------------------------------------------------------------------------------ No results for input(s): TSH, T4TOTAL, T3FREE, THYROIDAB in the last 72 hours.  Invalid input(s): FREET3   Coagulation profile No results for input(s): INR, PROTIME in the last 168 hours. ------------------------------------------------------------------------------------------------------------------- No results for input(s): DDIMER in the last 72 hours. -------------------------------------------------------------------------------------------------------------------  Cardiac Enzymes Recent Labs  Lab 03/04/18 1955  TROPONINI <0.03   ------------------------------------------------------------------------------------------------------------------ Invalid input(s): POCBNP  ---------------------------------------------------------------------------------------------------------------  Urinalysis    Component Value Date/Time   COLORURINE AMBER (A) 03/03/2018 1703   APPEARANCEUR HAZY (A)  03/03/2018 1703   APPEARANCEUR Clear 02/02/2016 1518   LABSPEC 1.024 03/03/2018 1703   PHURINE 5.0 03/03/2018 1703   GLUCOSEU NEGATIVE 03/03/2018 1703   GLUCOSEU NEGATIVE 12/24/2017 0931   HGBUR NEGATIVE 03/03/2018 1703   BILIRUBINUR NEGATIVE 03/03/2018 1703   BILIRUBINUR Negative 02/02/2016 1518   KETONESUR NEGATIVE 03/03/2018 1703   PROTEINUR 30 (A) 03/03/2018 1703   UROBILINOGEN 0.2 12/24/2017 0931   NITRITE NEGATIVE 03/03/2018 1703   LEUKOCYTESUR NEGATIVE 03/03/2018 1703   LEUKOCYTESUR Negative 02/02/2016 1518     RADIOLOGY: Dg Pelvis 1-2 Views  Result Date: 03/03/2018 CLINICAL DATA:  Pelvic pain after riding bike. EXAM: PELVIS - 1-2 VIEW COMPARISON:  CT scan of January 10, 2016. FINDINGS: There is no evidence of pelvic fracture or diastasis. No pelvic bone lesions are seen. IMPRESSION: Normal pelvis. Electronically Signed   By: Marijo Conception, M.D.   On: 03/03/2018 17:27   Ct Abdomen Pelvis W Contrast  Result Date: 03/03/2018 CLINICAL DATA:  Pelvic pain.  No reported injury.  Unable to walk. EXAM: CT ABDOMEN AND PELVIS WITH CONTRAST TECHNIQUE: Multidetector CT imaging of the abdomen and pelvis was performed using the standard protocol following bolus administration of intravenous contrast. CONTRAST:  29mL ISOVUE-300 IOPAMIDOL (ISOVUE-300) INJECTION 61% COMPARISON:  Pelvic radiograph from earlier today. 01/10/2016 CT abdomen/pelvis. FINDINGS: Lower chest: No acute abnormality at the lung bases. Nonspecific stable mild patchy subpleural reticulation throughout both lung bases. Coronary atherosclerosis. Hepatobiliary: Normal liver size. No liver mass. Cholecystectomy. Bile ducts are stable and within normal post cholecystectomy limits. Pancreas: Normal, with no mass or duct dilation. Spleen: Normal size. No mass. Adrenals/Urinary Tract: Normal adrenals. No hydronephrosis. Stable mild scarring in the upper right kidney. No renal mass. 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 No left hydronephrosis. Mild fullness of the right renal collecting system without overt right hydronephrosis. Normal bladder. Stable curvilinear hyperdensity surrounding the urethra, presumably iatrogenic (periurethral collagen injection or mesh). Stomach/Bowel: Small hiatal hernia. Otherwise normal nondistended stomach. Normal caliber small bowel with no small bowel wall thickening. Appendectomy. Mild sigmoid diverticulosis, with no large bowel wall thickening or pericolonic fat stranding. Fatty stools. Vascular/Lymphatic: Atherosclerotic nonaneurysmal abdominal aorta. Patent portal, splenic,  hepatic and renal veins. IVC filter in place below the level of the renal veins. No pathologically enlarged lymph nodes in the abdomen or pelvis. Reproductive: Grossly normal uterus.  No adnexal mass. Other: No pneumoperitoneum, ascites or focal fluid collection. Musculoskeletal: No aggressive appearing focal osseous lesions. Stable post vertebroplasty change in T12 level with stable severe T12 vertebral compression fracture. Severe multilevel thoracolumbar degenerative disc disease. IMPRESSION: 1. No acute abnormality. No evidence of bowel obstruction or acute bowel inflammation. Mild sigmoid diverticulosis, no evidence of acute diverticulitis. 2. Stable periurethral iatrogenic change. No overt hydronephrosis. Redemonstration of partial duplication of the right renal collecting system. 3. Stable chronic severe T12 vertebral compression fracture with associated stable post vertebroplasty change. 4.  Aortic Atherosclerosis (ICD10-I70.0).  Coronary atherosclerosis. 5. Small hiatal hernia. Electronically Signed   By: Ilona Sorrel M.D.   On: 03/03/2018 20:10    EKG: Orders placed or performed during the hospital encounter of 03/03/18  . EKG 12-Lead  . EKG 12-Lead    IMPRESSION AND PLAN:  1.  Pelvic pain resolved, likely related to osteoarthritis.  Will continue Tylenol as needed. PT  and OT are consulted to evaluate and treat the patient. 2.  Dizziness, described as lightheadedness, likely related to orthostatic hypotension and dehydration, from poor p.o. Intake.  We will start gentle IV hydration and hold blood pressure medication.  We will continue to monitor patient closely.  Continue telemetry and follow troponin levels, to rule out ACS. 3. ARF, likely related to dehydration from poor p.o. Intake.  Patient is encouraged to increase p.o. fluid intake.  We will start gentle IV hydration and monitor kidney function closely.  Avoid nephrotoxic medications. 4.  Diabetes type 2, stable.  Will monitor blood sugars before meals and at bedtime and use insulin treatment during the hospital stay.    All the records are reviewed and case discussed with ED provider. Management plans discussed with the patient and she is in agreement.  CODE STATUS:    Code Status Orders  (From admission, onward)        Start     Ordered   03/04/18 2231  Full code  Continuous     03/04/18 2230    Code Status History    Date Active Date Inactive Code Status Order ID Comments User Context   10/08/2015 2112 10/13/2015 1651 Full Code [id number]  Theodoro Grist, MD Inpatient   09/16/2015 0234 09/19/2015 0236 Full Code [id number]  Fritzi Mandes, MD Inpatient    Advance Directive Documentation     Most Recent Value  Type of Advance Directive  Healthcare Power of [place], Living will  Pre-existing out of facility DNR order (yellow form or pink MOST form)  -  "MOST" Form in Place?  -       TOTAL TIME TAKING CARE OF THIS PATIENT: 45 minutes.    Amelia Jo M.D on 03/04/2018 at 10:41 PM  Between 7am to 6pm - Pager - [phone]  After 6pm go to [email or web address] - password [email or web address] [facility]  [place] [facility]  Office  [phone]  CC: Primary care physician; McLean-Scocuzza, Nino Glow, MD

## 2018-03-04 NOTE — ED Notes (Signed)
EDP Siadecki notified of continued orthostatic hypotension. Orders received.

## 2018-03-04 NOTE — ED Notes (Signed)
Pt cleaned and changed. Denies needs at this time. States is comfortable in stretcher

## 2018-03-04 NOTE — Clinical Social Work Note (Addendum)
CSW received consult for "[id number]." Patient from home with a 24 hour elderly caregiver. P/T recommending home health physical therapy. Per RN Elmo Putt, patient ambulating better today. CSW met with patient and family at bedside. Patient and family agreeable to patient discharging home with home health P/T and caregiver. Patient and family could not remember home health agency patient worked with previously. CSW updated Dr. Cherylann Banas to put in home health orders. CSW informed RNCM Levada Dy, who will see patient/family at bedside. CSW signing off as no further Social Work intervention needed.    Oretha Ellis, Latanya Presser, [place] Social Worker-ED [phone]

## 2018-03-05 ENCOUNTER — Other Ambulatory Visit: Payer: Self-pay

## 2018-03-05 ENCOUNTER — Inpatient Hospital Stay (HOSPITAL_COMMUNITY)
Admission: EM | Admit: 2018-03-05 | Discharge: 2018-03-08 | Disposition: A | Discharge status: Skilled Nursing Facility | Payer: Medicare Other | Source: Home / Self Care | Attending: Internal Medicine | Admitting: Internal Medicine

## 2018-03-05 ENCOUNTER — Emergency Department: Payer: Medicare Other

## 2018-03-05 DIAGNOSIS — I951 Orthostatic hypotension: Secondary | ICD-10-CM | POA: Diagnosis not present

## 2018-03-05 LAB — BASIC METABOLIC PANEL
Anion gap: 6 (ref 5–15)
Anion gap: 7 (ref 5–15)
BUN: 20 mg/dL (ref 6–20)
BUN: 23 mg/dL — ABNORMAL HIGH (ref 6–20)
CO2: 25 mmol/L (ref 22–32)
CO2: 23 mmol/L (ref 22–32)
Calcium: 8.3 mg/dL — ABNORMAL LOW (ref 8.9–10.3)
Calcium: 7.8 mg/dL — ABNORMAL LOW (ref 8.9–10.3)
Chloride: 108 mmol/L (ref 101–111)
Chloride: 107 mmol/L (ref 101–111)
Creatinine, Ser: 0.96 mg/dL (ref 0.44–1.00)
Creatinine, Ser: 1.14 mg/dL — ABNORMAL HIGH (ref 0.44–1.00)
GFR calc Af Amer: 59 mL/min — ABNORMAL LOW (ref >60)
GFR calc Af Amer: 48 mL/min — ABNORMAL LOW (ref >60)
GFR calc non Af Amer: 51 mL/min — ABNORMAL LOW (ref >60)
GFR calc non Af Amer: 41 mL/min — ABNORMAL LOW (ref >60)
Glucose, Bld: 136 mg/dL — ABNORMAL HIGH (ref 65–99)
Glucose, Bld: 175 mg/dL — ABNORMAL HIGH (ref 65–99)
Potassium: 4.6 mmol/L (ref 3.5–5.1)
Potassium: 4.3 mmol/L (ref 3.5–5.1)
Sodium: 139 mmol/L (ref 135–145)
Sodium: 137 mmol/L (ref 135–145)

## 2018-03-05 LAB — CBC WITH DIFFERENTIAL/PLATELET
Basophils Absolute: 0 10*3/uL (ref 0–0.1)
Basophils Relative: 0 %
Eosinophils Absolute: 0 10*3/uL (ref 0–0.7)
Eosinophils Relative: 1 %
HCT: 40.5 % (ref 35.0–47.0)
Hemoglobin: 13.3 g/dL (ref 12.0–16.0)
Lymphocytes Relative: 12 %
Lymphs Abs: 1 10*3/uL (ref 1.0–3.6)
MCH: 31.1 pg (ref 26.0–34.0)
MCHC: 32.8 g/dL (ref 32.0–36.0)
MCV: 94.8 fL (ref 80.0–100.0)
Monocytes Absolute: 0.9 10*3/uL (ref 0.2–0.9)
Monocytes Relative: 10 %
Neutro Abs: 6.6 10*3/uL — ABNORMAL HIGH (ref 1.4–6.5)
Neutrophils Relative %: 77 %
Platelets: 108 10*3/uL — ABNORMAL LOW (ref 150–440)
RBC: 4.27 MIL/uL (ref 3.80–5.20)
RDW: 14.4 % (ref 11.5–14.5)
WBC: 8.5 10*3/uL (ref 3.6–11.0)

## 2018-03-05 LAB — GLUCOSE, CAPILLARY: Glucose-Capillary: 126 mg/dL — ABNORMAL HIGH (ref 65–99)

## 2018-03-05 LAB — CBC
HCT: 39.2 % (ref 35.0–47.0)
Hemoglobin: 12.8 g/dL (ref 12.0–16.0)
MCH: 30.8 pg (ref 26.0–34.0)
MCHC: 32.6 g/dL (ref 32.0–36.0)
MCV: 94.5 fL (ref 80.0–100.0)
Platelets: 127 10*3/uL — ABNORMAL LOW (ref 150–440)
RBC: 4.15 MIL/uL (ref 3.80–5.20)
RDW: 14.3 % (ref 11.5–14.5)
WBC: 8.3 10*3/uL (ref 3.6–11.0)

## 2018-03-05 LAB — TROPONIN I
Troponin I: <0.03 ng/mL (ref <0.03)
Troponin I: <0.03 ng/mL (ref <0.03)

## 2018-03-05 MED ORDER — LISINOPRIL 5 MG PO TABS: 2.5000 mg | ORAL_TABLET | Freq: Every day | ORAL | Status: DC

## 2018-03-05 MED ORDER — SODIUM CHLORIDE 0.9 % IV BOLUS
1000.0000 mL | Freq: Once | INTRAVENOUS | Status: AC
  Administered 2018-03-05: 1000 mL via INTRAVENOUS

## 2018-03-05 NOTE — ED Notes (Signed)
Family at bedside. 

## 2018-03-05 NOTE — ED Triage Notes (Signed)
Pt to the er for weakness and orthostatic hypotension. Pt was seen in the ER last night and admitted and discharged today. Pt BP drops to 70 systolic. Pt is dizzy and pt is unable to ambulate. Pt tolerating IV fluids. Pt is still weak.

## 2018-03-05 NOTE — Discharge Summary (Signed)
[facility] [place] NAME: Jessica Erickson    MR#:  [id number]  DATE OF BIRTH:  August 11, 1926  DATE OF ADMISSION:  03/03/2018 ADMITTING PHYSICIAN: Amelia Jo, MD  DATE OF DISCHARGE: 03/05/2018  PRIMARY CARE PHYSICIAN: McLean-Scocuzza, Nino Glow, MD    ADMISSION DIAGNOSIS:  Orthostatic hypotension [I95.1] Pelvic pain [R10.2]  DISCHARGE DIAGNOSIS:  Orthostatic hypotension due to dehydration resolved  SECONDARY DIAGNOSIS:   Past Medical History:  Diagnosis Date  . Asthma   . Bilateral swelling of feet    and legs  . CAD (coronary artery disease)   . Compression fracture of body of thoracic vertebra (HCC)    T12 09/18/15 MRI s/p fall   . Constipation   . COPD (chronic obstructive pulmonary disease) ([place])    previous CXR with chronic interstitial lung dz   . Diabetes ([place])    with neuropathy  . DVT (deep venous thrombosis) ([place])    right leg 10/2015 was on coumadin off as of 2017/2018 ; s/p IVC filter  . Fatty liver    09/15/15 also mildly dilated pancreatitic duct rec MRCP small sub cm cyst hemangioma speeln mild right hydronephrorossi and prox. hydroureter, kidney stones, mild scarring kidneys  . GERD (gastroesophageal reflux disease)    with small hiatal hernia   . Hard of hearing   . History of kidney problems   . Hyperlipidemia   . Hypertension   . Impaired mobility and ADLs    uses rolling walker has caretaker 24/7 at home  . Leg edema   . Neuropathy   . Osteoarthritis    DDD spine   . Pulmonary embolism ([place])    10/2015 off coumadin as of 04/2016  . Pulmonary HTN (HCC)    mild pulm HTN, echo 10/09/15 EF 35-46%FKCLE 1 dd, RV systolic pressure increased   . Recurrent UTI   . Recurrent UTI   . Skin cancer    BCC jawline and scalp   . Thyroid disease    follows [facility]  . TIA (transient ischemic attack)    MRI 2009/2010 neg stroke   . Trigeminal neuralgia    Dr. Tomi Bamberger s/p gamma knife x 2, on Tegretol  since 2011/2012 no increase in dose >200 mg bid rec per family per neurology   . Urinary, incontinence, stress female    Dr Erlene Quan urology     [facility] COURSE:  Jessica Erickson  is a 84 y.o. female with a known history of hypertension, coronary artery disease, osteoarthritis, diabetes, hearing deficit and many other comorbidities. Patient was brought to emergency room for severe bilateral pelvic pain when walking, that started acutely, in the morning.  The pain was sharp, without any radiation.  There was some nausea associated with the pain and one episode of vomiting, earlier in the day at home.  1.  Pelvic pain resolved, likely related to osteoarthritis.  Will continue Tylenol as needed. PT and OT are consulted to evaluate and treat the patient. -Pain much better.  CT abdomen unremarkable. -[place] therapy recommends home health PT which has been arranged.  2.  Dizziness, described as lightheadedness, likely related to orthostatic hypotension and dehydration, from poor p.o. Intake.  -Received gentle IV hydration and hold blood pressure medication.   -On telemetry patient remains sinus rhythm. -We will hold fosinopril at discharge.  This was discussed with patient's daughter Jessica Erickson.  They will keep a log of blood pressure at home. -Defer to resuming blood pressure medicine  after PCP follow-up.  3. ARF, likely related to dehydration from poor p.o. Intake.  Patient is encouraged to increase p.o. fluid intake.   -Received IV hydration and monitor kidney function closely.  Avoid nephrotoxic medications. -Creatinine back to baseline  4.  Diabetes type 2, stable.  Will monitor blood sugars before meals and at bedtime and use insulin treatment during the hospital stay.  -We will resume her metformin from tomorrow  Overall much improved.  Discussed with patient's daughter Jessica Erickson over the phone discharge plan.  Patient will discharge with home health PT/RN at home   CONSULTS OBTAINED:    DRUG  ALLERGIES:   Allergies  Allergen Reactions  . Penicillins Shortness Of Breath, Rash and Other (See Comments)    Has patient had a PCN reaction causing immediate rash, facial/tongue/throat swelling, SOB or lightheadedness with hypotension: Yes Has patient had a PCN reaction causing severe rash involving mucus membranes or skin necrosis: No Has patient had a PCN reaction that required hospitalization No Has patient had a PCN reaction occurring within the last 10 years: No If all of the above answers are "NO", then may proceed with Cephalosporin use.  . Sulfa Antibiotics Shortness Of Breath, Rash and Other (See Comments)  . Amitiza [Lubiprostone]     N/v/d  . Aspirin Other (See Comments)    Reaction:  Unknown   . Penicillin G Other (See Comments)    DISCHARGE MEDICATIONS:   Allergies as of 03/05/2018      Reactions   Penicillins Shortness Of Breath, Rash, Other (See Comments)   Has patient had a PCN reaction causing immediate rash, facial/tongue/throat swelling, SOB or lightheadedness with hypotension: Yes Has patient had a PCN reaction causing severe rash involving mucus membranes or skin necrosis: No Has patient had a PCN reaction that required hospitalization No Has patient had a PCN reaction occurring within the last 10 years: No If all of the above answers are "NO", then may proceed with Cephalosporin use.   Sulfa Antibiotics Shortness Of Breath, Rash, Other (See Comments)   Amitiza [lubiprostone]    N/v/d   Aspirin Other (See Comments)   Reaction:  Unknown    Penicillin G Other (See Comments)      Medication List    STOP taking these medications   acetaminophen-codeine 300-30 MG tablet Commonly known as:  TYLENOL #3   ciprofloxacin 500 MG tablet Commonly known as:  CIPRO   fosinopril 10 MG tablet Commonly known as:  MONOPRIL     TAKE these medications   acetaminophen 325 MG tablet Commonly known as:  TYLENOL Take 2 tablets (650 mg total) by mouth every 6 (six)  hours as needed. What changed:    how much to take  when to take this   aspirin EC 81 MG tablet Take 81 mg by mouth daily.   atorvastatin 40 MG tablet Commonly known as:  LIPITOR Take 40 mg by mouth daily at 6 PM.   budesonide-formoterol 160-4.5 MCG/ACT inhaler Commonly known as:  SYMBICORT Inhale 2 puffs into the lungs 2 (two) times daily. What changed:  Another medication with the same name was removed. Continue taking this medication, and follow the directions you see here.   CALCIUM 600+D 600-400 MG-UNIT tablet Generic drug:  Calcium Carbonate-Vitamin D Take 1 tablet by mouth daily.   carbamazepine 200 MG tablet Commonly known as:  TEGRETOL Take 1 tablet (200 mg total) by mouth 3 (three) times daily.   cetirizine 10 MG tablet Commonly known as:  ZYRTEC Take 10 mg by mouth daily.   fluticasone 50 MCG/ACT nasal spray Commonly known as:  FLONASE Place 1 spray into both nostrils daily.   furosemide 20 MG tablet Commonly known as:  LASIX Take 20 mg by mouth daily as needed for fluid or edema.   levothyroxine 200 MCG tablet Commonly known as:  SYNTHROID, LEVOTHROID Daily before breakfast Monday-Saturday. DO NOT TAKE ON SUNDAYS   MELATONIN PO Take 1 tablet by mouth at bedtime as needed (SLEEP).   metFORMIN 500 MG tablet Commonly known as:  GLUCOPHAGE Take 1 tablet (500 mg total) by mouth daily with breakfast.   multivitamin with minerals Tabs tablet Take 1 tablet by mouth daily.   pantoprazole 40 MG tablet Commonly known as:  PROTONIX Take 1 tablet (40 mg total) by mouth daily.   Polyethyl Glycol-Propyl Glycol 0.4-0.3 % Soln Apply 1 drop to eye at bedtime.   polyethylene glycol powder powder Commonly known as:  GLYCOLAX/MIRALAX Take 17 g by mouth daily as needed for mild constipation.   sucralfate 1 GM/10ML suspension Commonly known as:  CARAFATE Take 10 mLs (1 g total) by mouth 4 (four) times daily -  with meals and at bedtime.   tiotropium 18 MCG  inhalation capsule Commonly known as:  SPIRIVA Place 1 capsule (18 mcg total) into inhaler and inhale daily.       If you experience worsening of your admission symptoms, develop shortness of breath, life threatening emergency, suicidal or homicidal thoughts you must seek medical attention immediately by calling 911 or calling your MD immediately  if symptoms less severe.  You Must read complete instructions/literature along with all the possible adverse reactions/side effects for all the Medicines you take and that have been prescribed to you. Take any new Medicines after you have completely understood and accept all the possible adverse reactions/side effects.   Please note  You were cared for by a hospitalist during your hospital stay. If you have any questions about your discharge medications or the care you received while you were in the hospital after you are discharged, you can call the unit and asked to speak with the hospitalist on call if the hospitalist that took care of you is not available. Once you are discharged, your primary care physician will handle any further medical issues. Please note that NO REFILLS for any discharge medications will be authorized once you are discharged, as it is imperative that you return to your primary care physician (or establish a relationship with a primary care physician if you do not have one) for your aftercare needs so that they can reassess your need for medications and monitor your lab values. Today   SUBJECTIVE   Heart on hearing.  Doing well.  VITAL SIGNS:  Blood pressure 108/89, pulse 74, temperature 97.7 F (36.5 C), temperature source Oral, resp. rate 18, height 5\' 5"  (0.973 m), weight 92.4 kg (203 lb 11.2 oz), SpO2 95 %.  I/O:    Intake/Output Summary (Last 24 hours) at 03/05/2018 0934 Last data filed at 03/04/2018 1845 Gross per 24 hour  Intake 2000 ml  Output -  Net 2000 ml    PHYSICAL EXAMINATION:  GENERAL:  84  y.o.-year-old patient lying in the bed with no acute distress.  EYES: Pupils equal, round, reactive to light and accommodation. No scleral icterus. Extraocular muscles intact.  HEENT: Head atraumatic, normocephalic. Oropharynx and nasopharynx clear.  NECK:  Supple, no jugular venous distention. No thyroid enlargement, no tenderness.  LUNGS: Normal  breath sounds bilaterally, no wheezing, rales,rhonchi or crepitation. No use of accessory muscles of respiration.  CARDIOVASCULAR: S1, S2 normal. No murmurs, rubs, or gallops.  ABDOMEN: Soft, non-tender, non-distended. Bowel sounds present. No organomegaly or mass.  EXTREMITIES: No pedal edema, cyanosis, or clubbing.  NEUROLOGIC: Cranial nerves II through XII are intact. Muscle strength 5/5 in all extremities. Sensation intact. Gait not checked.  PSYCHIATRIC: The patient is alert and oriented x 3.  SKIN: No obvious rash, lesion, or ulcer.   DATA REVIEW:   CBC  Recent Labs  Lab 03/05/18 0127  WBC 8.3  HGB 12.8  HCT 39.2  PLT 127*    Chemistries  Recent Labs  Lab 03/03/18 1630  03/05/18 0127  NA 140   < > 139  K 4.5   < > 4.6  CL 104   < > 108  CO2 26   < > 25  GLUCOSE 135*   < > 136*  BUN 20   < > 20  CREATININE 1.16*   < > 0.96  CALCIUM 8.8*   < > 8.3*  AST 26  --   --   ALT 19  --   --   ALKPHOS 65  --   --   BILITOT 0.4  --   --    < > = values in this interval not displayed.    Microbiology Results   No results found for this or any previous visit (from the past 240 hour(s)).  RADIOLOGY:  Dg Pelvis 1-2 Views  Result Date: 03/03/2018 CLINICAL DATA:  Pelvic pain after riding bike. EXAM: PELVIS - 1-2 VIEW COMPARISON:  CT scan of January 10, 2016. FINDINGS: There is no evidence of pelvic fracture or diastasis. No pelvic bone lesions are seen. IMPRESSION: Normal pelvis. Electronically Signed   By: Marijo Conception, M.D.   On: 03/03/2018 17:27   Ct Abdomen Pelvis W Contrast  Result Date: 03/03/2018 CLINICAL DATA:  Pelvic  pain.  No reported injury.  Unable to walk. EXAM: CT ABDOMEN AND PELVIS WITH CONTRAST TECHNIQUE: Multidetector CT imaging of the abdomen and pelvis was performed using the standard protocol following bolus administration of intravenous contrast. CONTRAST:  67mL ISOVUE-300 IOPAMIDOL (ISOVUE-300) INJECTION 61% COMPARISON:  Pelvic radiograph from earlier today. 01/10/2016 CT abdomen/pelvis. FINDINGS: Lower chest: No acute abnormality at the lung bases. Nonspecific stable mild patchy subpleural reticulation throughout both lung bases. Coronary atherosclerosis. Hepatobiliary: Normal liver size. No liver mass. Cholecystectomy. Bile ducts are stable and within normal post cholecystectomy limits. Pancreas: Normal, with no mass or duct dilation. Spleen: Normal size. No mass. Adrenals/Urinary Tract: Normal adrenals. No hydronephrosis. Stable mild scarring in the upper right kidney. No renal mass. 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 No left hydronephrosis. Mild fullness of the right renal collecting system without overt right hydronephrosis. Normal bladder. Stable curvilinear hyperdensity surrounding the urethra, presumably iatrogenic (periurethral collagen injection or mesh). Stomach/Bowel: Small hiatal hernia. Otherwise normal nondistended stomach. Normal caliber small bowel with no small bowel wall thickening. Appendectomy. Mild sigmoid diverticulosis, with no large bowel wall thickening or pericolonic fat stranding. Fatty stools. Vascular/Lymphatic: Atherosclerotic nonaneurysmal abdominal aorta. Patent portal, splenic, hepatic and renal veins. IVC filter in place below the level of the renal veins. No pathologically enlarged lymph nodes in the abdomen or pelvis. Reproductive: Grossly normal uterus.  No adnexal mass. Other: No pneumoperitoneum, ascites or focal fluid collection. Musculoskeletal: No aggressive appearing focal osseous lesions. Stable post vertebroplasty  change in T12 level with stable severe T12 vertebral compression fracture. Severe multilevel thoracolumbar degenerative disc disease. IMPRESSION: 1. No acute abnormality. No evidence of bowel obstruction or acute bowel inflammation. Mild sigmoid diverticulosis, no evidence of acute diverticulitis. 2. Stable periurethral iatrogenic change. No overt hydronephrosis. Redemonstration of partial duplication of the right renal collecting system. 3. Stable chronic severe T12 vertebral compression fracture with associated stable post vertebroplasty change. 4.  Aortic Atherosclerosis (ICD10-I70.0).  Coronary atherosclerosis. 5. Small hiatal hernia. Electronically Signed   By: Ilona Sorrel M.D.   On: 03/03/2018 20:10     Management plans discussed with the patient, family and they are in agreement.  CODE STATUS:     Code Status Orders  (From admission, onward)        Start     Ordered   03/04/18 2231  Full code  Continuous     03/04/18 2230    Code Status History    Date Active Date Inactive Code Status Order ID Comments User Context   10/08/2015 2112 10/13/2015 1651 Full Code [id number]  Theodoro Grist, MD Inpatient   09/16/2015 0234 09/19/2015 0236 Full Code [id number]  Fritzi Mandes, MD Inpatient    Advance Directive Documentation     Most Recent Value  Type of Advance Directive  Healthcare Power of [place], Living will  Pre-existing out of facility DNR order (yellow form or pink MOST form)  -  "MOST" Form in Place?  -      TOTAL TIME TAKING CARE OF THIS PATIENT: 40 minutes.    Fritzi Mandes M.D on 03/05/2018 at 9:34 AM  Between 7am to 6pm - Pager - [phone] After 6pm go to [email or web address] - password [email or web address] [facility]  Office  [phone]  CC: Primary care physician; McLean-Scocuzza, Nino Glow, MD

## 2018-03-05 NOTE — Progress Notes (Signed)
Discharge instructions provided and reviewed with pt and daughter. No further questions at the time. Pt discharging in care of daughter to transition home.

## 2018-03-05 NOTE — H&P (Signed)
[facility] NAME: Jessica Erickson    MR#:  [id number]  DATE OF BIRTH:  [birth year]  DATE OF ADMISSION:  03/05/2018  PRIMARY CARE PHYSICIAN: McLean-Scocuzza, Nino Glow, MD   REQUESTING/REFERRING PHYSICIAN:   CHIEF COMPLAINT:   Chief Complaint  Patient presents with  . Weakness    HISTORY OF PRESENT ILLNESS: Jessica Erickson  is a 84 y.o. female with a known history of asthma, coronary artery disease, diabetes, PE, DVT, trigeminal neuralgia and other many comorbidities. Patient was brought to the hospital again for persistent orthostatic hypotension, drowsiness and generalized weakness.  She was just discharged from the hospital earlier in the day.  No fever, chills, no nausea/vomiting/diarrhea. Patient was admitted for the same reason, 2 days ago and found to be dehydrated.  She improved with IV fluids and was discharged home with family support.  All other workup including EKG, troponin level, brain CT, UA and chest x-ray were negative for any acute changes.  The only new change in her medications was increasing the Tegretol dose from twice a day to 3 times a day in the past month. Blood test done in the emergency room including CBC and CMP, are again unremarkable except for slightly elevated creatinine level. Per family, patient has had issues in the past with not being able to tolerate 3 times a day Tegretol.  PAST MEDICAL HISTORY:   Past Medical History:  Diagnosis Date  . Asthma   . Bilateral swelling of feet    and legs  . CAD (coronary artery disease)   . Compression fracture of body of thoracic vertebra (HCC)    T12 09/18/15 MRI s/p fall   . Constipation   . COPD (chronic obstructive pulmonary disease) ([place])    previous CXR with chronic interstitial lung dz   . Diabetes ([place])    with neuropathy  . DVT (deep venous thrombosis) ([place])    right leg 10/2015 was on coumadin off as of 2017/2018 ; s/p IVC filter  . Fatty liver     09/15/15 also mildly dilated pancreatitic duct rec MRCP small sub cm cyst hemangioma speeln mild right hydronephrorossi and prox. hydroureter, kidney stones, mild scarring kidneys  . GERD (gastroesophageal reflux disease)    with small hiatal hernia   . Hard of hearing   . History of kidney problems   . Hyperlipidemia   . Hypertension   . Impaired mobility and ADLs    uses rolling walker has caretaker 24/7 at home  . Leg edema   . Neuropathy   . Osteoarthritis    DDD spine   . Pulmonary embolism ([place])    10/2015 off coumadin as of 04/2016  . Pulmonary HTN (HCC)    mild pulm HTN, echo 10/09/15 EF 76-22%QJFHL 1 dd, RV systolic pressure increased   . Recurrent UTI   . Recurrent UTI   . Skin cancer    BCC jawline and scalp   . Thyroid disease    follows [facility]  . TIA (transient ischemic attack)    MRI 2009/2010 neg stroke   . Trigeminal neuralgia    Dr. Tomi Bamberger s/p gamma knife x 2, on Tegretol since 2011/2012 no increase in dose >200 mg bid rec per family per neurology   . Urinary, incontinence, stress female    Dr Erlene Quan urology     PAST SURGICAL HISTORY:  Past Surgical History:  Procedure Laterality Date  . APPENDECTOMY    .  BRAIN SURGERY     schwnnoma removal 1996   . brain tumor surgery    . BREAST SURGERY     breast bx  . CHOLECYSTECTOMY    . EYE SURGERY     cataract  . IVC FILTER PLACEMENT ([facility] HX)     Dr. Lucky Cowboy 10/2015   . MOHS SURGERY     scalp 04/2014   . PERIPHERAL VASCULAR CATHETERIZATION N/A 10/11/2015   Procedure: IVC Filter Insertion;  Surgeon: Algernon Huxley, MD;  Location: [facility];  Service: Cardiovascular;  Laterality: N/A;  . PUBOVAGINAL SLING    . THROAT SURGERY    . THYROID SURGERY    . THYROID SURGERY     tumor around vocal cords   . TOOTH EXTRACTION     winter 2018   . TUBAL LIGATION      SOCIAL HISTORY:  Social History   Tobacco Use  . Smoking status: Former Research scientist (life sciences)  . Smokeless tobacco: Never Used  . Tobacco  comment: quit 1996 smoked 20 years max 8 cig qd   Substance Use Topics  . Alcohol use: No    FAMILY HISTORY:  Family History  Problem Relation Age of Onset  . Heart disease Mother   . Diabetes Father   . Cancer Daughter        breast ca x 2 s/p mastectomy     DRUG ALLERGIES:  Allergies  Allergen Reactions  . Penicillins Shortness Of Breath, Rash and Other (See Comments)    Has patient had a PCN reaction causing immediate rash, facial/tongue/throat swelling, SOB or lightheadedness with hypotension: Yes Has patient had a PCN reaction causing severe rash involving mucus membranes or skin necrosis: No Has patient had a PCN reaction that required hospitalization No Has patient had a PCN reaction occurring within the last 10 years: No If all of the above answers are "NO", then may proceed with Cephalosporin use.  . Sulfa Antibiotics Shortness Of Breath, Rash and Other (See Comments)  . Amitiza [Lubiprostone]     N/v/d  . Aspirin Other (See Comments)    Reaction:  Unknown   . Penicillin G Other (See Comments)    REVIEW OF SYSTEMS:   Constitutional: No fever/chills, but complains of generalized weakness. Eyes: No visual changes. ENT: No sore throat. Cardiovascular: Denies chest pain. Respiratory: Denies shortness of breath. Gastrointestinal: No abdominal pain.  No nausea, no vomiting.  No diarrhea.  No constipation. Genitourinary: Negative for dysuria. Musculoskeletal: Negative for back pain.  Positive for unstable gait. Skin: Negative for rash. Neurological: Negative for headaches, focal weakness or numbness.  Patient complains of lightheadedness when standing up.    MEDICATIONS AT HOME:  Prior to Admission medications   Medication Sig Start Date End Date Taking? Authorizing Provider  acetaminophen (TYLENOL) 325 MG tablet Take 2 tablets (650 mg total) by mouth every 6 (six) hours as needed. Patient taking differently: Take 325 mg by mouth at bedtime as needed.  09/15/15   Yes Carrie Mew, MD  aspirin EC 81 MG tablet Take 81 mg by mouth daily.    Yes [provider]  atorvastatin (LIPITOR) 40 MG tablet Take 40 mg by mouth daily at 6 PM.    Yes [provider]  budesonide-formoterol (SYMBICORT) 160-4.5 MCG/ACT inhaler Inhale 2 puffs into the lungs 2 (two) times daily. 01/31/18  Yes McLean-Scocuzza, Nino Glow, MD  Calcium Carbonate-Vitamin D (CALCIUM 600+D) 600-400 MG-UNIT tablet Take 1 tablet by mouth daily.   Yes [provider]  carbamazepine (TEGRETOL) 200 MG tablet Take 1 tablet (200 mg total) by mouth 3 (three) times daily. Patient taking differently: Take 200 mg by mouth 2 (two) times daily.  02/15/18  Yes McLean-Scocuzza, Nino Glow, MD  cetirizine (ZYRTEC) 10 MG tablet Take 10 mg by mouth daily.    Yes [provider]  fluticasone (FLONASE) 50 MCG/ACT nasal spray Place 1 spray into both nostrils daily.   Yes [provider]  furosemide (LASIX) 20 MG tablet Take 20 mg by mouth daily as needed for fluid or edema.    Yes [provider]  guaiFENesin (MUCINEX) 600 MG 12 hr tablet Take 600 mg by mouth daily.   Yes [provider]  levothyroxine (SYNTHROID, LEVOTHROID) 200 MCG tablet Daily before breakfast Monday-Saturday. DO NOT TAKE ON SUNDAYS 02/05/18  Yes McLean-Scocuzza, Nino Glow, MD  MELATONIN PO Take 1 tablet by mouth at bedtime as needed (SLEEP).   Yes [provider]  metFORMIN (GLUCOPHAGE) 500 MG tablet Take 1 tablet (500 mg total) by mouth daily with breakfast. 02/19/18  Yes McLean-Scocuzza, Nino Glow, MD  Multiple Vitamin (MULTIVITAMIN WITH MINERALS) TABS tablet Take 1 tablet by mouth daily.   Yes [provider]  pantoprazole (PROTONIX) 40 MG tablet Take 1 tablet (40 mg total) by mouth daily. 12/27/17  Yes McLean-Scocuzza, Nino Glow, MD  Polyethyl Glycol-Propyl Glycol 0.4-0.3 % SOLN Place 1 drop into both eyes at bedtime.    Yes [provider]  polyethylene glycol powder  (GLYCOLAX/MIRALAX) powder Take 17 g by mouth daily as needed for mild constipation.  12/10/15  Yes [provider]  sucralfate (CARAFATE) 1 GM/10ML suspension Take 10 mLs (1 g total) by mouth 4 (four) times daily -  with meals and at bedtime. Patient taking differently: Take 1 g by mouth 3 (three) times daily as needed.  12/18/17  Yes McLean-Scocuzza, Nino Glow, MD  tiotropium (SPIRIVA) 18 MCG inhalation capsule Place 1 capsule (18 mcg total) into inhaler and inhale daily. 01/10/18  Yes McLean-Scocuzza, Nino Glow, MD  guaiFENesin (ROBITUSSIN) 100 MG/5ML liquid Take 200 mg by mouth 3 (three) times daily as needed for cough.    [provider]      PHYSICAL EXAMINATION:   VITAL SIGNS: Blood pressure (!) 125/56, pulse 73, temperature 98 F (36.7 C), temperature source Oral, resp. rate (!) 27, height 5\' 7"  (1.702 m), weight 89.4 kg (197 lb), SpO2 100 %.  GENERAL:  84 y.o.-year-old patient lying in the bed with no acute distress.  EYES: Pupils equal, round, reactive to light and accommodation. No scleral icterus.  HEENT: Head atraumatic, normocephalic. Oropharynx and nasopharynx clear.  NECK:  Supple, no jugular venous distention. No thyroid enlargement, no tenderness.  LUNGS: Normal breath sounds bilaterally, no wheezing, rales,rhonchi or crepitation. No use of accessory muscles of respiration.  CARDIOVASCULAR: S1, S2 normal. No S3/S4.  ABDOMEN: Soft, nontender, nondistended. Bowel sounds present. No organomegaly or mass.  EXTREMITIES: No pedal edema, cyanosis, or clubbing.  NEUROLOGIC: No focal weakness.  Patient needs moderate assist with standing up.  Her gait is unstable and she cannot walk more than 2 feet due to lightheadedness.  PSYCHIATRIC: The patient is alert, but seems drowsy.  SKIN: No obvious rash, lesion, or ulcer.   LABORATORY PANEL:   CBC Recent Labs  Lab 03/03/18 1630 03/04/18 1955 03/05/18 0127 03/05/18 2030  WBC 7.3 7.5 8.3 8.5  HGB 13.8 12.6 12.8 13.3  HCT  42.7 38.7 39.2 40.5  PLT 198 126* 127*  108*  MCV 95.7 95.0 94.5 94.8  MCH 30.9 31.0 30.8 31.1  MCHC 32.3 32.7 32.6 32.8  RDW 14.2 14.2 14.3 14.4  LYMPHSABS 1.4 1.1  --  1.0  MONOABS 0.6 0.7  --  0.9  EOSABS 0.1 0.1  --  0.0  BASOSABS 0.0 0.0  --  0.0   ------------------------------------------------------------------------------------------------------------------  Chemistries  Recent Labs  Lab 03/03/18 1630 03/04/18 1955 03/05/18 0127 03/05/18 2030  NA 140 138 139 137  K 4.5 4.3 4.6 4.3  CL 104 107 108 107  CO2 26 26 25 23   GLUCOSE 135* 139* 136* 175*  BUN 20 20 20  23*  CREATININE 1.16* 1.09* 0.96 1.14*  CALCIUM 8.8* 8.1* 8.3* 7.8*  AST 26  --   --   --   ALT 19  --   --   --   ALKPHOS 65  --   --   --   BILITOT 0.4  --   --   --    ------------------------------------------------------------------------------------------------------------------ estimated creatinine clearance is 38.4 mL/min (A) (by C-G formula based on SCr of 1.14 mg/dL (H)). ------------------------------------------------------------------------------------------------------------------ No results for input(s): TSH, T4TOTAL, T3FREE, THYROIDAB in the last 72 hours.  Invalid input(s): FREET3   Coagulation profile No results for input(s): INR, PROTIME in the last 168 hours. ------------------------------------------------------------------------------------------------------------------- No results for input(s): DDIMER in the last 72 hours. -------------------------------------------------------------------------------------------------------------------  Cardiac Enzymes Recent Labs  Lab 03/04/18 1955 03/05/18 0127 03/05/18 2030  TROPONINI <0.03 <0.03 <0.03   ------------------------------------------------------------------------------------------------------------------ Invalid input(s):  POCBNP  ---------------------------------------------------------------------------------------------------------------  Urinalysis    Component Value Date/Time   COLORURINE AMBER (A) 03/03/2018 1703   APPEARANCEUR HAZY (A) 03/03/2018 1703   APPEARANCEUR Clear 02/02/2016 1518   LABSPEC 1.024 03/03/2018 1703   PHURINE 5.0 03/03/2018 1703   GLUCOSEU NEGATIVE 03/03/2018 1703   GLUCOSEU NEGATIVE 12/24/2017 0931   HGBUR NEGATIVE 03/03/2018 1703   BILIRUBINUR NEGATIVE 03/03/2018 1703   BILIRUBINUR Negative 02/02/2016 1518   KETONESUR NEGATIVE 03/03/2018 1703   PROTEINUR 30 (A) 03/03/2018 1703   UROBILINOGEN 0.2 12/24/2017 0931   NITRITE NEGATIVE 03/03/2018 1703   LEUKOCYTESUR NEGATIVE 03/03/2018 1703   LEUKOCYTESUR Negative 02/02/2016 1518     RADIOLOGY: Ct Head Wo Contrast  Result Date: 03/05/2018 CLINICAL DATA:  Weakness and orthostatic hypotension EXAM: CT HEAD WITHOUT CONTRAST TECHNIQUE: Contiguous axial images were obtained from the base of the skull through the vertex without intravenous contrast. COMPARISON:  08/28/2009 FINDINGS: Brain: No evidence of acute infarction, hemorrhage, hydrocephalus, extra-axial collection or mass lesion/mass effect. Vascular: No hyperdense vessel or unexpected calcification. Skull: Postsurgical defect is noted in the left occipital region Sinuses/Orbits: Within normal limits. Other: None IMPRESSION: Postsurgical changes stable from the prior exam. No acute intracranial abnormality noted. Electronically Signed   By: Inez Catalina M.D.   On: 03/05/2018 20:58    EKG: Orders placed or performed during the hospital encounter of 03/05/18  . ED EKG  . ED EKG    IMPRESSION AND PLAN:  1.  Orthostatic hypotension.  Start gentle IV hydration.  Will discontinue lisinopril and Lasix.  We will decrease Tegretol from 3 times/day to twice a day.  Continue to monitor blood pressure closely. 2.  Acute renal failure, likely prerenal secondary to poor p.o. Intake.   We will start gentle IV hydration and continue to monitor kidney function closely.  Avoid nephrotoxic medications. 3.  Generalized weakness and unstable gait, associated with drowsiness.  These could be related to increased Tegretol dose.  Will reduce the dose from  3 times a day to twice a day, as patient has been able to tolerate this in the past.  Will discontinue Zyrtec, as it could contribute to her drowsiness.  Will consult PT and OT for further evaluation and treatment.  We will continue to monitor patient clinically closely.  Patient's family seems overwhelmed and not able to handle her at home.  They are requesting a short rehab stay for the patient until she would improve and be able to transfer and walk without assistance again. 4.  Diabetes type 2, stable.  Will monitor blood sugars before meals and at bedtime and use insulin sliding scale during the hospital stay.   All the records are reviewed and case discussed with ED provider. Management plans discussed with the patient, family and they are in agreement.  CODE STATUS: Code Status History    Date Active Date Inactive Code Status Order ID Comments User Context   03/04/2018 2230 03/05/2018 1742 Full Code [id number]  Amelia Jo, MD Inpatient   10/08/2015 2112 10/13/2015 1651 Full Code [id number]  Theodoro Grist, MD Inpatient   09/16/2015 0234 09/19/2015 0236 Full Code [id number]  Fritzi Mandes, MD Inpatient    Advance Directive Documentation     Most Recent Value  Type of Advance Directive  Healthcare Power of Attorney  Pre-existing out of facility DNR order (yellow form or pink MOST form)  -  "MOST" Form in Place?  -       TOTAL TIME TAKING CARE OF THIS PATIENT: 45 minutes.    Amelia Jo M.D on 03/05/2018 at 11:40 PM  Between 7am to 6pm - Pager - [phone]  After 6pm go to [email or web address] - password [email or web address] [facility]  [facility]  Office  [phone]  CC: Primary care physician; McLean-Scocuzza, Nino Glow,  MD

## 2018-03-05 NOTE — ED Notes (Signed)
Pt denies pain or nausea. Pt states she has no appetite but is drinking fluids and tolerating them well. Pt states she is just exhausted. Pt is drowsy and falling asleep during assessment. Pt was discharged from hospital a few hours ago. Per family pt was better this morning. Per EMs, pt is very orthostatic.

## 2018-03-05 NOTE — ED Provider Notes (Signed)
[facility] Emergency Department Provider Note  ___________________________________________   None    (approximate)  I have reviewed the triage vital signs and the nursing notes.   HISTORY  Chief Complaint Weakness   HPI Jessica Erickson is a 84 y.o. female with a history of CAD as well as COPD and DVT now off Coumadin who is presenting to the emergency department today for persistent orthostatic hypotension.  The patient was just discharged from the inpatient floor today after a prolonged emergency department stay which resulted in admission and gentle hydration.  However, once the patient arrived at home she was unable to stand up because of persistent dizziness with standing.  Upon arrival by EMS the patient was found to be 037 systolic when sitting in the 91 standing with symptomatic orthostasis with a dizzy sensation.  She was then transported to the emergency department for further evaluation.  She is currently on supplemental oxygen because of EMS finding her at 90% on room O2 upon arrival.  Not reporting any respiratory distress at this time.  Denying any pain.  Blood pressure medications have been held.  Past Medical History:  Diagnosis Date  . Asthma   . Bilateral swelling of feet    and legs  . CAD (coronary artery disease)   . Compression fracture of body of thoracic vertebra (HCC)    T12 09/18/15 MRI s/p fall   . Constipation   . COPD (chronic obstructive pulmonary disease) ([place])    previous CXR with chronic interstitial lung dz   . Diabetes ([place])    with neuropathy  . DVT (deep venous thrombosis) ([place])    right leg 10/2015 was on coumadin off as of 2017/2018 ; s/p IVC filter  . Fatty liver    09/15/15 also mildly dilated pancreatitic duct rec MRCP small sub cm cyst hemangioma speeln mild right hydronephrorossi and prox. hydroureter, kidney stones, mild scarring kidneys  . GERD (gastroesophageal reflux disease)    with small hiatal hernia   .  Hard of hearing   . History of kidney problems   . Hyperlipidemia   . Hypertension   . Impaired mobility and ADLs    uses rolling walker has caretaker 24/7 at home  . Leg edema   . Neuropathy   . Osteoarthritis    DDD spine   . Pulmonary embolism ([place])    10/2015 off coumadin as of 04/2016  . Pulmonary HTN (HCC)    mild pulm HTN, echo 10/09/15 EF 04-88%QBVQX 1 dd, RV systolic pressure increased   . Recurrent UTI   . Recurrent UTI   . Skin cancer    BCC jawline and scalp   . Thyroid disease    follows [facility]  . TIA (transient ischemic attack)    MRI 2009/2010 neg stroke   . Trigeminal neuralgia    Dr. Tomi Bamberger s/p gamma knife x 2, on Tegretol since 2011/2012 no increase in dose >200 mg bid rec per family per neurology   . Urinary, incontinence, stress female    Dr Erlene Quan urology     Patient Active Problem List   Diagnosis Date Noted  . Dizziness 03/04/2018  . COPD (chronic obstructive pulmonary disease) ([place]) 12/13/2017  . Anxiety and depression 12/13/2017  . Trigeminal neuralgia 12/13/2017  . GERD (gastroesophageal reflux disease) 12/13/2017  . Diabetes mellitus type 2, controlled ([place]) 12/13/2017  . Constipation 12/13/2017  . Insomnia 12/13/2017  . Hypothyroidism 12/13/2017  . UTI (urinary tract infection) 10/08/2015  .  Collapsed vertebra, not elsewhere classified, thoracic region, initial encounter for fracture ([place]) 09/20/2015  . Basal cell carcinoma of scalp 04/06/2014  . Fothergill's neuralgia 08/05/2013  . Bladder infection, chronic 02/11/2013  . Female genuine stress incontinence 02/11/2013  . Incomplete bladder emptying 02/11/2013  . Intrinsic sphincter deficiency 02/11/2013  . Mixed incontinence 02/11/2013  . Excessive urination at night 02/11/2013  . Bladder retention 02/11/2013  . FOM (frequency of micturition) 02/11/2013  . Basal cell carcinoma of face 09/13/2011    Past Surgical History:  Procedure Laterality Date  . APPENDECTOMY    .  BRAIN SURGERY     schwnnoma removal 1996   . brain tumor surgery    . BREAST SURGERY     breast bx  . CHOLECYSTECTOMY    . EYE SURGERY     cataract  . IVC FILTER PLACEMENT ([facility] HX)     Dr. Lucky Cowboy 10/2015   . MOHS SURGERY     scalp 04/2014   . PERIPHERAL VASCULAR CATHETERIZATION N/A 10/11/2015   Procedure: IVC Filter Insertion;  Surgeon: Algernon Huxley, MD;  Location: [facility];  Service: Cardiovascular;  Laterality: N/A;  . PUBOVAGINAL SLING    . THROAT SURGERY    . THYROID SURGERY    . THYROID SURGERY     tumor around vocal cords   . TOOTH EXTRACTION     winter 2018   . TUBAL LIGATION      Prior to Admission medications   Medication Sig Start Date End Date Taking? Authorizing Provider  acetaminophen (TYLENOL) 325 MG tablet Take 2 tablets (650 mg total) by mouth every 6 (six) hours as needed. Patient taking differently: Take 325 mg by mouth at bedtime as needed.  09/15/15  Yes Carrie Mew, MD  aspirin EC 81 MG tablet Take 81 mg by mouth daily.     [provider]  atorvastatin (LIPITOR) 40 MG tablet Take 40 mg by mouth daily at 6 PM.     [provider]  budesonide-formoterol (SYMBICORT) 160-4.5 MCG/ACT inhaler Inhale 2 puffs into the lungs 2 (two) times daily. 01/31/18   McLean-Scocuzza, Nino Glow, MD  Calcium Carbonate-Vitamin D (CALCIUM 600+D) 600-400 MG-UNIT tablet Take 1 tablet by mouth daily.    [provider]  carbamazepine (TEGRETOL) 200 MG tablet Take 1 tablet (200 mg total) by mouth 3 (three) times daily. 02/15/18   McLean-Scocuzza, Nino Glow, MD  cetirizine (ZYRTEC) 10 MG tablet Take 10 mg by mouth daily.     [provider]  fluticasone (FLONASE) 50 MCG/ACT nasal spray Place 1 spray into both nostrils daily.    [provider]  furosemide (LASIX) 20 MG tablet Take 20 mg by mouth daily as needed for fluid or edema.     [provider]  levothyroxine (SYNTHROID, LEVOTHROID) 200 MCG tablet Daily before breakfast  Monday-Saturday. DO NOT TAKE ON SUNDAYS 02/05/18   McLean-Scocuzza, Nino Glow, MD  MELATONIN PO Take 1 tablet by mouth at bedtime as needed (SLEEP).    [provider]  metFORMIN (GLUCOPHAGE) 500 MG tablet Take 1 tablet (500 mg total) by mouth daily with breakfast. 02/19/18   McLean-Scocuzza, Nino Glow, MD  Multiple Vitamin (MULTIVITAMIN WITH MINERALS) TABS tablet Take 1 tablet by mouth daily.    [provider]  pantoprazole (PROTONIX) 40 MG tablet Take 1 tablet (40 mg total) by mouth daily. 12/27/17   McLean-Scocuzza, Nino Glow, MD  Polyethyl Glycol-Propyl Glycol 0.4-0.3 % SOLN Apply 1 drop to eye at  bedtime.     [provider]  polyethylene glycol powder (GLYCOLAX/MIRALAX) powder Take 17 g by mouth daily as needed for mild constipation.  12/10/15   [provider]  sucralfate (CARAFATE) 1 GM/10ML suspension Take 10 mLs (1 g total) by mouth 4 (four) times daily -  with meals and at bedtime. 12/18/17   McLean-Scocuzza, Nino Glow, MD  tiotropium (SPIRIVA) 18 MCG inhalation capsule Place 1 capsule (18 mcg total) into inhaler and inhale daily. 01/10/18   McLean-Scocuzza, Nino Glow, MD    Allergies Penicillins; Sulfa antibiotics; Amitiza [lubiprostone]; Aspirin; and Penicillin g  Family History  Problem Relation Age of Onset  . Heart disease Mother   . Diabetes Father   . Cancer Daughter        breast ca x 2 s/p mastectomy     Social History Social History   Tobacco Use  . Smoking status: Former Research scientist (life sciences)  . Smokeless tobacco: Never Used  . Tobacco comment: quit 1996 smoked 20 years max 8 cig qd   Substance Use Topics  . Alcohol use: No  . Drug use: No    Review of Systems  Constitutional: No fever/chills Eyes: No visual changes. ENT: No sore throat. Cardiovascular: Denies chest pain. Respiratory: Denies shortness of breath. Gastrointestinal: No abdominal pain.  No nausea, no vomiting.  No diarrhea.  No constipation. Genitourinary: Negative for  dysuria. Musculoskeletal: Negative for back pain. Skin: Negative for rash. Neurological: Negative for headaches, focal weakness or numbness.   ____________________________________________   PHYSICAL EXAM:  VITAL SIGNS: ED Triage Vitals  Enc Vitals Group     BP 03/05/18 2012 107/63     Pulse Rate 03/05/18 2012 87     Resp 03/05/18 2012 18     Temp 03/05/18 2012 98 F (36.7 C)     Temp Source 03/05/18 2012 Oral     SpO2 03/05/18 2012 100 %     Weight 03/05/18 2014 197 lb (89.4 kg)     Height 03/05/18 2014 5\' 7"  (1.702 m)     Head Circumference --      Peak Flow --      Pain Score 03/05/18 2013 0     Pain Loc --      Pain Edu? --      Excl. in [place]? --     Constitutional: Alert and oriented. Well appearing and in no acute distress. Eyes: Conjunctivae are normal.  Head: Atraumatic. Nose: No congestion/rhinnorhea. Mouth/Throat: Mucous membranes are moist.  Neck: No stridor.   Cardiovascular: Normal rate, regular rhythm. Grossly normal heart sounds.   Respiratory: Normal respiratory effort.  No retractions. Lungs CTAB. Gastrointestinal: Soft and nontender. No distention.  Musculoskeletal: No lower extremity tenderness nor edema.  No joint effusions. Neurologic:  Normal speech and language. No gross focal neurologic deficits are appreciated. Skin:  Skin is warm, dry and intact. No rash noted. Psychiatric: Mood and affect are normal. Speech and behavior are normal.  ____________________________________________   LABS (all labs ordered are listed, but only abnormal results are displayed)  Labs Reviewed  CBC WITH DIFFERENTIAL/PLATELET - Abnormal; Notable for the following components:      Result Value   Platelets 108 (*)    Neutro Abs 6.6 (*)    All other components within normal limits  BASIC METABOLIC PANEL - Abnormal; Notable for the following components:   Glucose, Bld 175 (*)    BUN 23 (*)    Creatinine, Ser 1.14 (*)    Calcium 7.8 (*)  GFR calc non Af Amer 41  (*)    GFR calc Af Amer 48 (*)    All other components within normal limits  TROPONIN I   ____________________________________________  EKG  ED ECG REPORT I, Doran Stabler, the attending physician, personally viewed and interpreted this ECG.   Date: 03/05/2018  EKG Time: 2015  Rate: 86  Rhythm: normal sinus rhythm  Axis: Normal  Intervals:nonspecific intraventricular conduction delay  ST&T Change: No ST segment elevation or depression.  No abnormal T wave inversion. No significant change from previous. ____________________________________________  RADIOLOGY  CT scan of the brain without any acute finding. ____________________________________________   PROCEDURES  Procedure(s) performed:   Procedures  Critical Care performed:   ____________________________________________   INITIAL IMPRESSION / ASSESSMENT AND PLAN / ED COURSE  Pertinent labs & imaging results that were available during my care of the patient were reviewed by me and considered in my medical decision making (see chart for details).  DDX: Symptomatic orthostasis, dehydration, heart failure, autonomic instability As part of my medical decision making, I reviewed the following data within the [facility] chart reviewed  ----------------------------------------- 10:57 PM on 03/05/2018 -----------------------------------------  Patient with persistent symptoms.  Just discharged earlier from the hospital today.  To readmit to the hospital.  Family is concerned because patient has been on 3 times daily Tegretol over the past month.  They said that several years ago she had a similar effect with her 3 times daily Tegretol.  They said that she tolerates twice daily Tegretol well.  She is taking this for trigeminal neuralgia.  Discussed this with the admitting doctor, Dr. Prudence Davidson.  Patient to be readmitted to the hospital. ____________________________________________   FINAL CLINICAL  IMPRESSION(S) / ED DIAGNOSES  Symptomatic, orthostatic hypotension.   NEW MEDICATIONS STARTED DURING THIS VISIT:  New Prescriptions   No medications on file     Note:  This document was prepared using Dragon voice recognition software and may include unintentional dictation errors.     Orbie Pyo, MD 03/05/18 2258

## 2018-03-05 NOTE — ED Notes (Signed)
Patient is resting comfortably. 

## 2018-03-05 NOTE — Care Management (Signed)
Discharge to home today per Dr. Fritzi Mandes. Floydene Flock, [facility] representative updated. Family will transport Shelbie Ammons RN MSN CCM Care Management [phone]

## 2018-03-06 ENCOUNTER — Inpatient Hospital Stay: Payer: Medicare Other | Admitting: Internal Medicine

## 2018-03-06 ENCOUNTER — Other Ambulatory Visit: Payer: Self-pay

## 2018-03-06 DIAGNOSIS — I951 Orthostatic hypotension: Secondary | ICD-10-CM | POA: Diagnosis not present

## 2018-03-06 LAB — CBC
HCT: 36.3 % (ref 35.0–47.0)
Hemoglobin: 11.8 g/dL — ABNORMAL LOW (ref 12.0–16.0)
MCH: 30.6 pg (ref 26.0–34.0)
MCHC: 32.4 g/dL (ref 32.0–36.0)
MCV: 94.3 fL (ref 80.0–100.0)
Platelets: 101 10*3/uL — ABNORMAL LOW (ref 150–440)
RBC: 3.85 MIL/uL (ref 3.80–5.20)
RDW: 14.1 % (ref 11.5–14.5)
WBC: 7.6 10*3/uL (ref 3.6–11.0)

## 2018-03-06 LAB — GLUCOSE, CAPILLARY
Glucose-Capillary: 121 mg/dL — ABNORMAL HIGH (ref 65–99)
Glucose-Capillary: 146 mg/dL — ABNORMAL HIGH (ref 65–99)
Glucose-Capillary: 117 mg/dL — ABNORMAL HIGH (ref 65–99)
Glucose-Capillary: 138 mg/dL — ABNORMAL HIGH (ref 65–99)

## 2018-03-06 LAB — BASIC METABOLIC PANEL
Anion gap: 6 (ref 5–15)
BUN: 20 mg/dL (ref 6–20)
CO2: 23 mmol/L (ref 22–32)
Calcium: 8 mg/dL — ABNORMAL LOW (ref 8.9–10.3)
Chloride: 109 mmol/L (ref 101–111)
Creatinine, Ser: 0.94 mg/dL (ref 0.44–1.00)
GFR calc Af Amer: >60 mL/min (ref >60)
GFR calc non Af Amer: 52 mL/min — ABNORMAL LOW (ref >60)
Glucose, Bld: 125 mg/dL — ABNORMAL HIGH (ref 65–99)
Potassium: 4.3 mmol/L (ref 3.5–5.1)
Sodium: 138 mmol/L (ref 135–145)

## 2018-03-06 MED ORDER — SUCRALFATE 1 GM/10ML PO SUSP: 1.0000 g | Freq: Three times a day (TID) | ORAL | Status: DC | PRN

## 2018-03-06 MED ORDER — CALCIUM CARBONATE-VITAMIN D 500-200 MG-UNIT PO TABS
1.0000 | ORAL_TABLET | Freq: Every day | ORAL | Status: DC
  Administered 2018-03-06 – 2018-03-08 (×3): 1 via ORAL
  Filled 2018-03-06 (×3): qty 1

## 2018-03-06 MED ORDER — MELATONIN 5 MG PO TABS
2.5000 mg | ORAL_TABLET | Freq: Every evening | ORAL | Status: DC | PRN
  Administered 2018-03-07: 2.5 mg via ORAL
  Filled 2018-03-06 (×2): qty 0.5

## 2018-03-06 MED ORDER — SODIUM CHLORIDE 0.9 % IV SOLN
INTRAVENOUS | Status: DC
  Administered 2018-03-06 – 2018-03-07 (×3): via INTRAVENOUS

## 2018-03-06 MED ORDER — HYDROCODONE-ACETAMINOPHEN 5-325 MG PO TABS: 1.0000 | ORAL_TABLET | ORAL | Status: DC | PRN

## 2018-03-06 MED ORDER — TIOTROPIUM BROMIDE MONOHYDRATE 18 MCG IN CAPS
18.0000 ug | ORAL_CAPSULE | Freq: Every day | RESPIRATORY_TRACT | Status: DC
  Administered 2018-03-06 – 2018-03-08 (×3): 18 ug via RESPIRATORY_TRACT
  Filled 2018-03-06: qty 5

## 2018-03-06 MED ORDER — ONDANSETRON HCL 4 MG PO TABS
4.0000 mg | ORAL_TABLET | Freq: Four times a day (QID) | ORAL | Status: DC | PRN
Start: 2018-03-06 — End: 2018-03-08

## 2018-03-06 MED ORDER — ONDANSETRON HCL 4 MG/2ML IJ SOLN: 4.0000 mg | Freq: Four times a day (QID) | INTRAMUSCULAR | Status: DC | PRN

## 2018-03-06 MED ORDER — HEPARIN SODIUM (PORCINE) 5000 UNIT/ML IJ SOLN
5000.0000 [IU] | Freq: Three times a day (TID) | INTRAMUSCULAR | Status: DC
  Administered 2018-03-06 – 2018-03-08 (×7): 5000 [IU] via SUBCUTANEOUS
  Filled 2018-03-06 (×7): qty 1

## 2018-03-06 MED ORDER — POLYETHYLENE GLYCOL 3350 17 G PO PACK: 17.0000 g | PACK | Freq: Every day | ORAL | Status: DC | PRN

## 2018-03-06 MED ORDER — MOMETASONE FURO-FORMOTEROL FUM 200-5 MCG/ACT IN AERO
2.0000 | INHALATION_SPRAY | Freq: Two times a day (BID) | RESPIRATORY_TRACT | Status: DC
  Filled 2018-03-06: qty 8.8

## 2018-03-06 MED ORDER — FLUTICASONE PROPIONATE 50 MCG/ACT NA SUSP
1.0000 | Freq: Every day | NASAL | Status: DC
  Administered 2018-03-06 – 2018-03-08 (×3): 1 via NASAL
  Filled 2018-03-06: qty 16

## 2018-03-06 MED ORDER — CARBAMAZEPINE 200 MG PO TABS
200.0000 mg | ORAL_TABLET | Freq: Two times a day (BID) | ORAL | Status: DC
  Administered 2018-03-06 – 2018-03-08 (×5): 200 mg via ORAL
  Filled 2018-03-06 (×6): qty 1

## 2018-03-06 MED ORDER — SODIUM CHLORIDE 0.9 % IV SOLN
Freq: Once | INTRAVENOUS | Status: AC
  Administered 2018-03-06: 02:00:00 via INTRAVENOUS

## 2018-03-06 MED ORDER — TRAZODONE HCL 50 MG PO TABS: 25.0000 mg | ORAL_TABLET | Freq: Every evening | ORAL | Status: DC | PRN

## 2018-03-06 MED ORDER — PANTOPRAZOLE SODIUM 40 MG PO TBEC
40.0000 mg | DELAYED_RELEASE_TABLET | Freq: Every day | ORAL | Status: DC
  Administered 2018-03-06 – 2018-03-08 (×3): 40 mg via ORAL
  Filled 2018-03-06 (×3): qty 1

## 2018-03-06 MED ORDER — MELATONIN 3 MG PO TABS: ORAL_TABLET | Freq: Every evening | ORAL | Status: DC | PRN

## 2018-03-06 MED ORDER — GUAIFENESIN ER 600 MG PO TB12
1200.0000 mg | ORAL_TABLET | Freq: Two times a day (BID) | ORAL | Status: DC
  Administered 2018-03-06 – 2018-03-08 (×5): 1200 mg via ORAL
  Filled 2018-03-06 (×5): qty 2

## 2018-03-06 MED ORDER — POLYETHYLENE GLYCOL 3350 17 GM/SCOOP PO POWD: 17.0000 g | Freq: Every day | ORAL | Status: DC | PRN

## 2018-03-06 MED ORDER — INSULIN ASPART 100 UNIT/ML ~~LOC~~ SOLN: 0.0000 [IU] | Freq: Every day | SUBCUTANEOUS | Status: DC

## 2018-03-06 MED ORDER — POLYVINYL ALCOHOL 1.4 % OP SOLN
1.0000 [drp] | Freq: Every day | OPHTHALMIC | Status: DC
Start: 2018-03-06 — End: 2018-03-06
  Filled 2018-03-06: qty 15

## 2018-03-06 MED ORDER — ACETAMINOPHEN 325 MG PO TABS: 650.0000 mg | ORAL_TABLET | Freq: Four times a day (QID) | ORAL | Status: DC | PRN

## 2018-03-06 MED ORDER — ADULT MULTIVITAMIN W/MINERALS CH
1.0000 | ORAL_TABLET | Freq: Every day | ORAL | Status: DC
  Administered 2018-03-06 – 2018-03-08 (×3): 1 via ORAL
  Filled 2018-03-06 (×3): qty 1

## 2018-03-06 MED ORDER — ASPIRIN EC 81 MG PO TBEC
81.0000 mg | DELAYED_RELEASE_TABLET | Freq: Every day | ORAL | Status: DC
  Administered 2018-03-06 – 2018-03-08 (×3): 81 mg via ORAL
  Filled 2018-03-06 (×3): qty 1

## 2018-03-06 MED ORDER — POLYETHYL GLYCOL-PROPYL GLYCOL 0.4-0.3 % OP GEL
1.0000 "application " | Freq: Every day | OPHTHALMIC | Status: DC
  Administered 2018-03-06 – 2018-03-07 (×2): 1 via OPHTHALMIC
  Filled 2018-03-06: qty 10

## 2018-03-06 MED ORDER — BISACODYL 5 MG PO TBEC: 5.0000 mg | DELAYED_RELEASE_TABLET | Freq: Every day | ORAL | Status: DC | PRN

## 2018-03-06 MED ORDER — INSULIN ASPART 100 UNIT/ML ~~LOC~~ SOLN
0.0000 [IU] | Freq: Three times a day (TID) | SUBCUTANEOUS | Status: DC
  Administered 2018-03-06 – 2018-03-07 (×5): 1 [IU] via SUBCUTANEOUS
  Filled 2018-03-06 (×5): qty 1

## 2018-03-06 MED ORDER — ACETAMINOPHEN 650 MG RE SUPP: 650.0000 mg | Freq: Four times a day (QID) | RECTAL | Status: DC | PRN

## 2018-03-06 MED ORDER — DEXTROMETHORPHAN POLISTIREX ER 30 MG/5ML PO SUER
60.0000 mg | Freq: Two times a day (BID) | ORAL | Status: DC
  Administered 2018-03-06 – 2018-03-08 (×5): 60 mg via ORAL
  Filled 2018-03-06 (×7): qty 10

## 2018-03-06 MED ORDER — POLYETHYL GLYCOL-PROPYL GLYCOL 0.4-0.3 % OP SOLN: 1.0000 [drp] | Freq: Every day | OPHTHALMIC | Status: DC

## 2018-03-06 MED ORDER — ATORVASTATIN CALCIUM 20 MG PO TABS
40.0000 mg | ORAL_TABLET | Freq: Every day | ORAL | Status: DC
  Administered 2018-03-06 – 2018-03-07 (×2): 40 mg via ORAL
  Filled 2018-03-06 (×2): qty 2

## 2018-03-06 MED ORDER — MIDODRINE HCL 5 MG PO TABS
10.0000 mg | ORAL_TABLET | Freq: Three times a day (TID) | ORAL | Status: DC
  Administered 2018-03-06 – 2018-03-08 (×7): 10 mg via ORAL
  Filled 2018-03-06 (×8): qty 2

## 2018-03-06 MED ORDER — LEVOTHYROXINE SODIUM 100 MCG PO TABS
200.0000 ug | ORAL_TABLET | Freq: Every day | ORAL | Status: DC
  Administered 2018-03-06 – 2018-03-08 (×3): 200 ug via ORAL
  Filled 2018-03-06 (×3): qty 2

## 2018-03-06 MED ORDER — DOCUSATE SODIUM 100 MG PO CAPS
100.0000 mg | ORAL_CAPSULE | Freq: Two times a day (BID) | ORAL | Status: DC
  Administered 2018-03-06 – 2018-03-08 (×4): 100 mg via ORAL
  Filled 2018-03-06 (×5): qty 1

## 2018-03-06 MED ORDER — BUDESONIDE-FORMOTEROL FUMARATE 160-4.5 MCG/ACT IN AERO
2.0000 | INHALATION_SPRAY | Freq: Two times a day (BID) | RESPIRATORY_TRACT | Status: DC
  Administered 2018-03-06 – 2018-03-08 (×5): 2 via RESPIRATORY_TRACT
  Filled 2018-03-06: qty 6

## 2018-03-06 NOTE — Progress Notes (Signed)
Patient was transferred from the ED via a stretcher following c/o generalized weakness and dizziness. On admission patient was A&O X4, hard of hearing and accompanied by family member. Patient admission documentation was completed with daughter. Patient VS was stable, on room air and denied pain or been in any discomfort. Patient has no acute event overnight, she was assisted as needed.

## 2018-03-06 NOTE — Care Management (Signed)
Patient discharged from [facility] yesterday with [facility] and PT. Returned to the ED with weakness and orthostasis.

## 2018-03-06 NOTE — Evaluation (Signed)
Physical Therapy Evaluation Patient Details Name: Jessica Erickson MRN: [id number] DOB: May 01, 1926 Today's Date: 03/06/2018   History of Present Illness  presented to ER and admitted secondary to persistent orthostatic hypotension; unable to mobilize in home environment.  Clinical Impression  Upon evaluation, patient alert and oriented; follows all commands and demonstrates good effort with all functional activities.  Fatigues quickly, tolerating mobility only from bed/chair during session.  Mild SOB with exertion, but vitals stable and WFL on RA.  No subjective or objective signs/symptoms of orthostasis noted. Currently able to complete bed mobility with min assist; sit/stand, basic transfers and gait (5') with RW, cga/min assist.  Forward trunk flexion with antalgic gait pattern, limited step height/length, but no overt buckling or LOB. Would benefit from skilled PT to address above deficits and promote optimal return to PLOF; Recommend transition to [place] and continued 24/7 sup/assist upon discharge from acute hospitalization.     Follow Up Recommendations Home health PT;Supervision/Assistance - 24 hour    Equipment Recommendations       Recommendations for Other Services       Precautions / Restrictions Precautions Precautions: Fall Restrictions Weight Bearing Restrictions: No      Mobility  Bed Mobility Overal bed mobility: Needs Assistance Bed Mobility: Supine to Sit     Supine to sit: Min assist        Transfers Overall transfer level: Needs assistance Equipment used: Rolling walker (2 wheeled) Transfers: Sit to/from Stand Sit to Stand: Min guard;Min assist         General transfer comment: multiple attempts to complete; requires UE support to assist with movement transition, braces self with posterior surface of bilat LEs against edge of bed  Ambulation/Gait Ambulation/Gait assistance: Min guard;Min assist Ambulation Distance (Feet): 5 Feet Assistive device:  Rolling walker (2 wheeled)       General Gait Details: forward flexed posture, decreased step height/length; mildly antalgic to R LE. Fatigues quickly, unable to tolerate additional activity at this time; vitals stable and WFL.  Stairs            Wheelchair Mobility    Modified Rankin (Stroke Patients Only)       Balance Overall balance assessment: Needs assistance Sitting-balance support: No upper extremity supported;Feet supported Sitting balance-Leahy Scale: Good     Standing balance support: Bilateral upper extremity supported Standing balance-Leahy Scale: Fair                               Pertinent Vitals/Pain Pain Assessment: No/denies pain    Home Living Family/patient expects to be discharged to:: Private residence Living Arrangements: Alone;Other (Comment)(24 hour caregiver) Available Help at Discharge: Family;Personal care attendant;Available 24 hours/day(has two caregivers that split time to provide 24/7 sup/assist) Type of Home: Apartment Home Access: Level entry     Home Layout: One level Home Equipment: Walker - 4 wheels;Shower seat      Prior Function Level of Independence: Needs assistance         Comments: Mod indep for basic transfers and household ambulation with 4WRW; assist from caregiver for ADLs (gets in walk-in shower with seat 3x/week).  Rides stationary bike 1 mile/episode, 4x/week.  Denies additional fall history.  Caregivers responsible for additional household responsibilites/chores.     Hand Dominance        Extremity/Trunk Assessment   Upper Extremity Assessment Upper Extremity Assessment: Overall WFL for tasks assessed    Lower Extremity Assessment Lower Extremity  Assessment: Generalized weakness(grossly at least 4-/5 throughout)       Communication      Cognition Arousal/Alertness: Awake/alert Behavior During Therapy: WFL for tasks assessed/performed Overall Cognitive Status: Within Functional  Limits for tasks assessed                                        General Comments      Exercises     Assessment/Plan    PT Assessment Patient needs continued PT services  PT Problem List Decreased activity tolerance;Decreased balance;Decreased mobility;Decreased coordination;Decreased cognition;Decreased knowledge of use of DME;Decreased safety awareness;Decreased knowledge of precautions;Cardiopulmonary status limiting activity       PT Treatment Interventions DME instruction;Gait training;Functional mobility training;Therapeutic activities;Therapeutic exercise;Balance training;Patient/family education    PT Goals (Current goals can be found in the Care Plan section)  Acute Rehab PT Goals Patient Stated Goal: to try to do what you want me to do PT Goal Formulation: With patient/family Time For Goal Achievement: 03/20/18 Potential to Achieve Goals: Good    Frequency Min 2X/week   Barriers to discharge        Co-evaluation               AM-PAC PT "6 Clicks" Daily Activity  Outcome Measure Difficulty turning over in bed (including adjusting bedclothes, sheets and blankets)?: A Little Difficulty moving from lying on back to sitting on the side of the bed? : A Little Difficulty sitting down on and standing up from a chair with arms (e.g., wheelchair, bedside commode, etc,.)?: Unable Help needed moving to and from a bed to chair (including a wheelchair)?: A Little Help needed walking in hospital room?: A Lot Help needed climbing 3-5 steps with a railing? : A Lot 6 Click Score: 14    End of Session Equipment Utilized During Treatment: Gait belt Activity Tolerance: Patient limited by fatigue Patient left: in chair;with chair alarm set;with call bell/phone within reach Nurse Communication: Mobility status PT Visit Diagnosis: Unsteadiness on feet (R26.81);Muscle weakness (generalized) (M62.81);Difficulty in walking, not elsewhere classified (R26.2)     Time: 7902-4097 PT Time Calculation (min) (ACUTE ONLY): 25 min   Charges:   PT Evaluation $PT Eval Moderate Complexity: 1 Mod PT Treatments $Therapeutic Activity: 8-22 mins   PT G Codes:       Azyriah Nevins H. Owens Shark, PT, DPT, NCS 03/06/18, 2:21 PM [phone]

## 2018-03-06 NOTE — Progress Notes (Addendum)
[facility] NAME: Sally-Anne Wamble    MR#:  [id number]  DATE OF BIRTH:  August 26, 1926  SUBJECTIVE:  CHIEF COMPLAINT:   Chief Complaint  Patient presents with  . Weakness  Patient without complaint, family at the bedside, no events overnight per nursing staff, patient and the patient's family interested in inpatient rehab if deemed appropriate  REVIEW OF SYSTEMS:  CONSTITUTIONAL: No fever, fatigue or weakness.  EYES: No blurred or double vision.  EARS, NOSE, AND THROAT: No tinnitus or ear pain.  RESPIRATORY: No cough, shortness of breath, wheezing or hemoptysis.  CARDIOVASCULAR: No chest pain, orthopnea, edema.  GASTROINTESTINAL: No nausea, vomiting, diarrhea or abdominal pain.  GENITOURINARY: No dysuria, hematuria.  ENDOCRINE: No polyuria, nocturia,  HEMATOLOGY: No anemia, easy bruising or bleeding SKIN: No rash or lesion. MUSCULOSKELETAL: No joint pain or arthritis.   NEUROLOGIC: No tingling, numbness, weakness.  PSYCHIATRY: No anxiety or depression.   ROS  DRUG ALLERGIES:   Allergies  Allergen Reactions  . Penicillins Shortness Of Breath, Rash and Other (See Comments)    Has patient had a PCN reaction causing immediate rash, facial/tongue/throat swelling, SOB or lightheadedness with hypotension: Yes Has patient had a PCN reaction causing severe rash involving mucus membranes or skin necrosis: No Has patient had a PCN reaction that required hospitalization No Has patient had a PCN reaction occurring within the last 10 years: No If all of the above answers are "NO", then may proceed with Cephalosporin use.  . Sulfa Antibiotics Shortness Of Breath, Rash and Other (See Comments)  . Amitiza [Lubiprostone]     N/v/d  . Aspirin Other (See Comments)    Reaction:  Unknown   . Penicillin G Other (See Comments)    VITALS:  Blood pressure (!) 144/50, pulse 62, temperature 98.3 F (36.8 C), temperature source Oral, resp. rate 14,  height 5\' 7"  (1.702 m), weight 93.8 kg (206 lb 12.8 oz), SpO2 94 %.  PHYSICAL EXAMINATION:  GENERAL:  84 y.o.-year-old patient lying in the bed with no acute distress.  EYES: Pupils equal, round, reactive to light and accommodation. No scleral icterus. Extraocular muscles intact.  HEENT: Head atraumatic, normocephalic. Oropharynx and nasopharynx clear.  NECK:  Supple, no jugular venous distention. No thyroid enlargement, no tenderness.  LUNGS: Normal breath sounds bilaterally, no wheezing, rales,rhonchi or crepitation. No use of accessory muscles of respiration.  CARDIOVASCULAR: S1, S2 normal. No murmurs, rubs, or gallops.  ABDOMEN: Soft, nontender, nondistended. Bowel sounds present. No organomegaly or mass.  EXTREMITIES: No pedal edema, cyanosis, or clubbing.  NEUROLOGIC: Cranial nerves II through XII are intact. Muscle strength 5/5 in all extremities. Sensation intact. Gait not checked.  PSYCHIATRIC: The patient is alert and oriented x 3.  SKIN: No obvious rash, lesion, or ulcer.   Physical Exam LABORATORY PANEL:   CBC Recent Labs  Lab 03/06/18 0358  WBC 7.6  HGB 11.8*  HCT 36.3  PLT 101*   ------------------------------------------------------------------------------------------------------------------  Chemistries  Recent Labs  Lab 03/03/18 1630  03/06/18 0358  NA 140   < > 138  K 4.5   < > 4.3  CL 104   < > 109  CO2 26   < > 23  GLUCOSE 135*   < > 125*  BUN 20   < > 20  CREATININE 1.16*   < > 0.94  CALCIUM 8.8*   < > 8.0*  AST 26  --   --   ALT 19  --   --  ALKPHOS 65  --   --   BILITOT 0.4  --   --    < > = values in this interval not displayed.   ------------------------------------------------------------------------------------------------------------------  Cardiac Enzymes Recent Labs  Lab 03/05/18 0127 03/05/18 2030  TROPONINI <0.03 <0.03    ------------------------------------------------------------------------------------------------------------------  RADIOLOGY:  Ct Head Wo Contrast  Result Date: 03/05/2018 CLINICAL DATA:  Weakness and orthostatic hypotension EXAM: CT HEAD WITHOUT CONTRAST TECHNIQUE: Contiguous axial images were obtained from the base of the skull through the vertex without intravenous contrast. COMPARISON:  08/28/2009 FINDINGS: Brain: No evidence of acute infarction, hemorrhage, hydrocephalus, extra-axial collection or mass lesion/mass effect. Vascular: No hyperdense vessel or unexpected calcification. Skull: Postsurgical defect is noted in the left occipital region Sinuses/Orbits: Within normal limits. Other: None IMPRESSION: Postsurgical changes stable from the prior exam. No acute intracranial abnormality noted. Electronically Signed   By: Inez Catalina M.D.   On: 03/05/2018 20:58    ASSESSMENT AND PLAN:  1.    Acute recurrent orthostatic hypotension  Patient was discharged from the hospital on yesterday and has since bounced back Start Midodrine, stop all antihypertensive medications, encourage p.o. fluids/hydration, discontinue IV fluids, physical therapy to evaluate/treat  Per daughter-concern for side effects from Tegretol-decreased to twice daily   2.    Acute kidney injury Resolved with IV fluids for rehydration  Suspect may be related to poor p.o. Intake  3.    Acute on chronic deconditioning, chronic  Physical therapy to evaluate/treat  Increase nursing care as needed, aspiration/fall/skin care precautions  In discussion with the patient's multiple children which also includes Dr. Junita Push wish for DNR status going forward/no ICU care/no invasive intervention, family is interested in inpatient rehab if patient deemed qualified   4.    Chronic diabetes mellitus type 2  Stable  Continue current regiment   Disposition to inpatient rehab, skilled nursing facility, versus home in the care of  family with services Long-term prognosis is poor-palliative care consult   All the records are reviewed and case discussed with Care Management/Social Workerr. Management plans discussed with the patient, family and they are in agreement.  CODE STATUS: DNR  TOTAL TIME TAKING CARE OF THIS PATIENT: 45 minutes.     POSSIBLE D/C IN 1-2 DAYS, DEPENDING ON CLINICAL CONDITION.   Avel Peace Steele Ledonne M.D on 03/06/2018   Between 7am to 6pm - Pager - [phone]  After 6pm go to [email or web address] - password [email or web address] [facility]  Office  [phone]  CC: Primary care physician; McLean-Scocuzza, Nino Glow, MD  Note: This dictation was prepared with Dragon dictation along with smaller phrase technology. Any transcriptional errors that result from this process are unintentional.

## 2018-03-06 NOTE — Evaluation (Signed)
Occupational Therapy Evaluation Patient Details Name: Jessica Erickson MRN: [id number] DOB: [birth year] Today's Date: 03/06/2018    History of Present Illness Pt. is an 84 y.o. female who was admitted to [facility] with persistent Orthostatic Hypotension. Pt. PMHx includes: Appendectomy, Brain Surgery, Breast CA, IVC Filter, Mild SOB with exertion, Asthma, DM, PE, DVT, Trigeminal Neuralgia.    Clinical Impression   Pt. Presents with weakness, limited activity tolerance, and limited functional mobility which limits her ability to complete basic ADL and IADL functioning. Pt. resides at home with alone. Pt. was modified independent with basic ADLs. Pt. Required assist from caregivers for compression hose. Pt. also required assist with meal preparation, medication management, home management, and household tasks. Pt. education was provided about self-care tasks, and OT services. Pt. Reports feeling weak after sitting up this afternoon. Pt.'s BP was 131/39 in supine. HR 65. Pt's nurse was notified. Pt. Could benefit from OT services for ADL training, A/E training, and pt. Education about home modification, and DME. Pt. Could benefit from SNF level of care upon discharge with follow-up OT services.    Follow Up Recommendations  SNF    Equipment Recommendations       Recommendations for Other Services       Precautions / Restrictions Precautions Precautions: Fall Restrictions Weight Bearing Restrictions: No      Mobility Bed Mobility    Deferred secondary to BP      Transfers  deferred secondary to BP       General transfer comment: multiple attempts to complete; requires UE support to assist with movement transition, braces self with posterior surface of bilat LEs against edge of bed    Balance Overall balance assessment: Needs assistance Sitting-balance support: No upper extremity supported;Feet supported Sitting balance-Leahy Scale: Good     Standing balance support: Bilateral upper  extremity supported Standing balance-Leahy Scale: Fair                             ADL either performed or assessed with clinical judgement   ADL Overall ADL's : Needs assistance/impaired Eating/Feeding: Independent   Grooming: Set up;Independent   Upper Body Bathing: Set up;Minimal assistance   Lower Body Bathing: Set up;Minimal assistance   Upper Body Dressing : Set up;Moderate assistance   Lower Body Dressing: Moderate assistance                       Vision         Perception     Praxis      Pertinent Vitals/Pain Pain Assessment: No/denies pain     Hand Dominance     Extremity/Trunk Assessment Upper Extremity Assessment Upper Extremity Assessment: Generalized weakness         Communication Communication Communication: HOH   Cognition Arousal/Alertness: Awake/alert Behavior During Therapy: WFL for tasks assessed/performed Overall Cognitive Status: Within Functional Limits for tasks assessed                                     General Comments       Exercises     Shoulder Instructions      Home Living Family/patient expects to be discharged to:: Private residence Living Arrangements: Alone Available Help at Discharge: Family;Personal care attendant;Available 24 hours/day Type of Home: Apartment Home Access: Level entry     Home Layout: One level  Home Equipment: [place] - 4 wheels;Shower seat          Prior Functioning/Environment Level of Independence: Needs assistance        Comments: Mod. Independent with basic ADL tasks. Caregivers assist with compression hose, meal preparation, and medication management. Pt. uses a rollator for mobility, and enjoys sewing.        OT Problem List: Decreased strength;Decreased activity tolerance      OT Treatment/Interventions: Self-care/ADL training;Therapeutic exercise;Patient/family education;DME and/or AE instruction;Therapeutic  activities;Energy conservation    OT Goals(Current goals can be found in the care plan section) Acute Rehab OT Goals Patient Stated Goal: to try to do what you want me to do  OT Frequency: Min 1X/week   Barriers to D/C:            Co-evaluation              AM-PAC PT "6 Clicks" Daily Activity     Outcome Measure Help from another person eating meals?: None Help from another person taking care of personal grooming?: None Help from another person toileting, which includes using toliet, bedpan, or urinal?: A Lot Help from another person bathing (including washing, rinsing, drying)?: A Lot Help from another person to put on and taking off regular upper body clothing?: A Little Help from another person to put on and taking off regular lower body clothing?: A Lot 6 Click Score: 17   End of Session Equipment Utilized During Treatment: Gait belt  Activity Tolerance: Patient tolerated treatment well Patient left: in bed  OT Visit Diagnosis: Muscle weakness (generalized) (M62.81)                Time: 1610-9604 OT Time Calculation (min): 23 min Charges:  OT General Charges $OT Visit: 1 Visit OT Evaluation $OT Eval Low Complexity: 1 Low G-Codes:    Harrel Carina, MS, OTR/L   Harrel Carina, MS, OTR/L 03/06/2018, 4:56 PM

## 2018-03-06 NOTE — Progress Notes (Signed)
Family Meeting Note  Advance Directive:yes  Today a meeting took place with the Patient, patient's daughter, patient's son/Dr. Kenton [place], son-in-law.  Patient is able to participate   The following clinical team members were present during this meeting:MD  The following were discussed:Patient's diagnosis: , Patient's progosis: Unable to determine and Goals for treatment: DNR  Additional follow-up to be provided: prn  Time spent during discussion:20 minutes  Jessica Harms, MD

## 2018-03-06 NOTE — Care Management (Addendum)
Patient's daughter is adamant that at patient current functional status "there is no way she can return home."  Patient has been on a functional decline over the last few weeks.  Prior to this episode of illness, patient was independent in her ambulation, able to go out and "walk for blocks" and ride a bike. She ambulated 5 feet yesterday with physical therapy with walker and with contact guard assist. This is not patient's baseline "far from it" per daughter.  She was also independent in all her adls.   Daughter says this is what happened last time her Tegretol was increased.  Dose is being decreased as of yesterday . Updated CSW of potential need for SNF.

## 2018-03-07 ENCOUNTER — Encounter
Admission: RE | Admit: 2018-03-07 | Discharge: 2018-03-07 | Disposition: A | Discharge status: Home / Self Care | Payer: Medicare Other | Source: Ambulatory Visit | Attending: Internal Medicine | Admitting: Internal Medicine

## 2018-03-07 DIAGNOSIS — Z7189 Other specified counseling: Secondary | ICD-10-CM

## 2018-03-07 DIAGNOSIS — I951 Orthostatic hypotension: Secondary | ICD-10-CM | POA: Diagnosis not present

## 2018-03-07 DIAGNOSIS — Z515 Encounter for palliative care: Secondary | ICD-10-CM

## 2018-03-07 LAB — GLUCOSE, CAPILLARY
Glucose-Capillary: 126 mg/dL — ABNORMAL HIGH (ref 65–99)
Glucose-Capillary: 128 mg/dL — ABNORMAL HIGH (ref 65–99)
Glucose-Capillary: 135 mg/dL — ABNORMAL HIGH (ref 65–99)
Glucose-Capillary: 137 mg/dL — ABNORMAL HIGH (ref 65–99)

## 2018-03-07 NOTE — Clinical Social Work Note (Signed)
CSW presented bed offers to patient and her daughter, they chose [facility].  CSW contacted [employer], and they have began insurance authorization.  Jones Broom. [place], MSW, [place]  03/07/2018 5:46 PM

## 2018-03-07 NOTE — Consult Note (Addendum)
Consultation Note Date: 03/07/2018   Patient Name: Jessica Erickson  DOB: [birth year]  MRN: [id number]  Age / Sex: 84 y.o., female  PCP: McLean-Scocuzza, Nino Glow, MD Referring Physician: Vaughan Basta, *  Reason for Consultation: Establishing goals of care  HPI/Patient Profile:    Jessica Erickson  is a 84 y.o. female with a known history of asthma, coronary artery disease, diabetes, PE, DVT, trigeminal neuralgia and non-O2 dependent COPD. Patient was brought to the hospital again for persistent orthostatic hypotension, drowsiness and generalized weakness.  She was just discharged from the hospital earlier in the day.   Gamma knife for trigeminal neuralgia 3/7th. Weaning from Tegretol.   Clinical Assessment and Goals of Care: Patient resting in bed. RN at bedside. She states she is alert and oriented, but hard of hearing. Daughter who is a Marine scientist is at bedside. [name] states she was married for 68 years; her husband died in Mar 02, 2013. She uses a walker and has for the last 2 years since a spine surgery.  She has 24/7 care givers. Her daughter states her mother is able to feed herself, and clean herself somewhat, but needs help with cleaning after toileting, and needs assistance with dressing. She uses pads for slight bladder incontinence. Her appetite has been good at home. Her lethargy and other symptoms come with increasing the Tegretol levels. The caregivers help with medications management.   She states the difference in her mother today and a week ago is night and day.   Patient's daughter states she is a Marine scientist and her brother is a Tax adviser. She states one of her other brothers is legally the POA because they do not live in the area. She states her mother drew up a POA and living will years ago that they are trying to find. She states a DNR status is out of the question. Following conversation with sister,  discussed conversation with primary team, and question of code status noted per recent EMR note. Spoke with son Richardean Sale, he confirms he wants patient to be full code at this time until he can speak further with his family.    SUMMARY OF RECOMMENDATIONS    Continue current level of care at this time.    Code Status/Advance Care Planning:  Full code    Symptom Management:   Per primary team.  Palliative Prophylaxis:   Eye Care and Oral Care  Prognosis:   Unable to determine. Depends on her progress moving forward.   Discharge Planning: SNF     Primary Diagnoses: Present on Admission: . Orthostasis   I have reviewed the medical record, interviewed the patient and family, and examined the patient. The following aspects are pertinent.  Past Medical History:  Diagnosis Date  . Asthma   . Bilateral swelling of feet    and legs  . CAD (coronary artery disease)   . Compression fracture of body of thoracic vertebra (HCC)    T12 09/18/15 MRI s/p fall   . Constipation   . COPD (chronic  obstructive pulmonary disease) ([place])    previous CXR with chronic interstitial lung dz   . Diabetes ([place])    with neuropathy  . DVT (deep venous thrombosis) ([place])    right leg 10/2015 was on coumadin off as of 2017/2018 ; s/p IVC filter  . Fatty liver    09/15/15 also mildly dilated pancreatitic duct rec MRCP small sub cm cyst hemangioma speeln mild right hydronephrorossi and prox. hydroureter, kidney stones, mild scarring kidneys  . GERD (gastroesophageal reflux disease)    with small hiatal hernia   . Hard of hearing   . History of kidney problems   . Hyperlipidemia   . Hypertension   . Impaired mobility and ADLs    uses rolling walker has caretaker 24/7 at home  . Leg edema   . Neuropathy   . Osteoarthritis    DDD spine   . Pulmonary embolism ([place])    10/2015 off coumadin as of 04/2016  . Pulmonary HTN (HCC)    mild pulm HTN, echo 10/09/15 EF 54-27%CWCBJ 1 dd, RV systolic  pressure increased   . Recurrent UTI   . Recurrent UTI   . Skin cancer    BCC jawline and scalp   . Thyroid disease    follows [facility]  . TIA (transient ischemic attack)    MRI 2009/2010 neg stroke   . Trigeminal neuralgia    Dr. Tomi Bamberger s/p gamma knife x 2, on Tegretol since 2011/2012 no increase in dose >200 mg bid rec per family per neurology   . Urinary, incontinence, stress female    Dr Erlene Quan urology    Social History   Socioeconomic History  . Marital status: Widowed    Spouse name: Not on file  . Number of children: Not on file  . Years of education: Not on file  . Highest education level: Not on file  Occupational History  . Not on file  Social Needs  . Financial resource strain: Not on file  . Food insecurity:    Worry: Not on file    Inability: Not on file  . Transportation needs:    Medical: Not on file    Non-medical: Not on file  Tobacco Use  . Smoking status: Former Research scientist (life sciences)  . Smokeless tobacco: Never Used  . Tobacco comment: quit 1996 smoked 20 years max 8 cig qd   Substance and Sexual Activity  . Alcohol use: No  . Drug use: No  . Sexual activity: Not on file  Lifestyle  . Physical activity:    Days per week: Not on file    Minutes per session: Not on file  . Stress: Not on file  Relationships  . Social connections:    Talks on phone: Not on file    Gets together: Not on file    Attends religious service: Not on file    Active member of club or organization: Not on file    Attends meetings of clubs or organizations: Not on file    Relationship status: Not on file  Other Topics Concern  . Not on file  Social History Narrative  . Not on file   Family History  Problem Relation Age of Onset  . Heart disease Mother   . Diabetes Father   . Cancer Daughter        breast ca x 2 s/p mastectomy    Scheduled Meds: . aspirin EC  81 mg Oral Daily  . atorvastatin  40 mg Oral q1800  .  budesonide-formoterol  2 puff Inhalation BID  .  calcium-vitamin D  1 tablet Oral Daily  . carbamazepine  200 mg Oral BID  . guaiFENesin  1,200 mg Oral BID   And  . dextromethorphan  60 mg Oral BID  . docusate sodium  100 mg Oral BID  . fluticasone  1 spray Each Nare Daily  . heparin  5,000 Units Subcutaneous Q8H  . insulin aspart  0-5 Units Subcutaneous QHS  . insulin aspart  0-9 Units Subcutaneous TID WC  . levothyroxine  200 mcg Oral QAC breakfast  . midodrine  10 mg Oral TID WC  . multivitamin with minerals  1 tablet Oral Daily  . pantoprazole  40 mg Oral Daily  . Polyethyl Glycol-Propyl Glycol  1 application Both Eyes QHS  . tiotropium  18 mcg Inhalation Daily   Continuous Infusions: PRN Meds:.acetaminophen **OR** acetaminophen, bisacodyl, HYDROcodone-acetaminophen, Melatonin, ondansetron **OR** ondansetron (ZOFRAN) IV, polyethylene glycol, sucralfate, traZODone Medications Prior to Admission:  Prior to Admission medications   Medication Sig Start Date End Date Taking? Authorizing Provider  acetaminophen (TYLENOL) 325 MG tablet Take 2 tablets (650 mg total) by mouth every 6 (six) hours as needed. Patient taking differently: Take 325 mg by mouth at bedtime as needed.  09/15/15  Yes Carrie Mew, MD  aspirin EC 81 MG tablet Take 81 mg by mouth daily.    Yes [provider]  atorvastatin (LIPITOR) 40 MG tablet Take 40 mg by mouth daily at 6 PM.    Yes [provider]  budesonide-formoterol (SYMBICORT) 160-4.5 MCG/ACT inhaler Inhale 2 puffs into the lungs 2 (two) times daily. 01/31/18  Yes McLean-Scocuzza, Nino Glow, MD  Calcium Carbonate-Vitamin D (CALCIUM 600+D) 600-400 MG-UNIT tablet Take 1 tablet by mouth daily.   Yes [provider]  carbamazepine (TEGRETOL) 200 MG tablet Take 1 tablet (200 mg total) by mouth 3 (three) times daily. Patient taking differently: Take 200 mg by mouth 2 (two) times daily.  02/15/18  Yes McLean-Scocuzza, Nino Glow, MD  cetirizine (ZYRTEC) 10 MG tablet Take 10 mg by mouth  daily.    Yes [provider]  fluticasone (FLONASE) 50 MCG/ACT nasal spray Place 1 spray into both nostrils daily.   Yes [provider]  furosemide (LASIX) 20 MG tablet Take 20 mg by mouth daily as needed for fluid or edema.    Yes [provider]  guaiFENesin (MUCINEX) 600 MG 12 hr tablet Take 600 mg by mouth daily.   Yes [provider]  levothyroxine (SYNTHROID, LEVOTHROID) 200 MCG tablet Daily before breakfast Monday-Saturday. DO NOT TAKE ON SUNDAYS 02/05/18  Yes McLean-Scocuzza, Nino Glow, MD  MELATONIN PO Take 1 tablet by mouth at bedtime as needed (SLEEP).   Yes [provider]  metFORMIN (GLUCOPHAGE) 500 MG tablet Take 1 tablet (500 mg total) by mouth daily with breakfast. 02/19/18  Yes McLean-Scocuzza, Nino Glow, MD  Multiple Vitamin (MULTIVITAMIN WITH MINERALS) TABS tablet Take 1 tablet by mouth daily.   Yes [provider]  pantoprazole (PROTONIX) 40 MG tablet Take 1 tablet (40 mg total) by mouth daily. 12/27/17  Yes McLean-Scocuzza, Nino Glow, MD  Polyethyl Glycol-Propyl Glycol 0.4-0.3 % SOLN Place 1 drop into both eyes at bedtime.    Yes [provider]  polyethylene glycol powder (GLYCOLAX/MIRALAX) powder Take 17 g by mouth daily as needed for mild constipation.  12/10/15  Yes [provider]  sucralfate (CARAFATE) 1 GM/10ML suspension Take 10 mLs (1 g total) by mouth 4 (four)  times daily -  with meals and at bedtime. Patient taking differently: Take 1 g by mouth 3 (three) times daily as needed.  12/18/17  Yes McLean-Scocuzza, Nino Glow, MD  tiotropium (SPIRIVA) 18 MCG inhalation capsule Place 1 capsule (18 mcg total) into inhaler and inhale daily. 01/10/18  Yes McLean-Scocuzza, Nino Glow, MD  guaiFENesin (ROBITUSSIN) 100 MG/5ML liquid Take 200 mg by mouth 3 (three) times daily as needed for cough.    [provider]   Allergies  Allergen Reactions  . Penicillins Shortness Of Breath, Rash and Other (See Comments)    Has  patient had a PCN reaction causing immediate rash, facial/tongue/throat swelling, SOB or lightheadedness with hypotension: Yes Has patient had a PCN reaction causing severe rash involving mucus membranes or skin necrosis: No Has patient had a PCN reaction that required hospitalization No Has patient had a PCN reaction occurring within the last 10 years: No If all of the above answers are "NO", then may proceed with Cephalosporin use.  . Sulfa Antibiotics Shortness Of Breath, Rash and Other (See Comments)  . Amitiza [Lubiprostone]     N/v/d  . Aspirin Other (See Comments)    Reaction:  Unknown   . Penicillin G Other (See Comments)   Review of Systems  All other systems reviewed and are negative.   Physical Exam  Constitutional: No distress.  HENT:  Head: Normocephalic.  Pulmonary/Chest: Effort normal.  Neurological: She is alert.  Skin: Skin is warm and dry.    Vital Signs: BP (!) 139/58 (BP Location: Right Arm)   Pulse 70   Temp 98.7 F (37.1 C) (Oral)   Resp 16   Ht 5\' 7"  (1.702 m)   Wt 94.3 kg (207 lb 12.8 oz)   LMP  (LMP Unknown)   SpO2 93%   BMI 32.55 kg/m  Pain Scale: 0-10 POSS *See Group Information*: S-Acceptable,Sleep, easy to arouse Pain Score: 0-No pain   SpO2: SpO2: 93 % O2 Device:SpO2: 93 % O2 Flow Rate: .   IO: Intake/output summary:   Intake/Output Summary (Last 24 hours) at 03/07/2018 1327 Last data filed at 03/07/2018 1007 Gross per 24 hour  Intake 2055 ml  Output 0 ml  Net 2055 ml    LBM: Last BM Date: 03/05/18 Baseline Weight: Weight: 89.4 kg (197 lb) Most recent weight: Weight: 94.3 kg (207 lb 12.8 oz)     Palliative Assessment/Data:  40%     Time In: 12:40 Time Out: 1:50 Time Total: 70 min Greater than 50%  of this time was spent counseling and coordinating care related to the above assessment and plan.  Signed by: Asencion Gowda, NP   Please contact Palliative Medicine Team phone at [phone] for questions and concerns.  For  individual provider: See Shea Evans

## 2018-03-07 NOTE — Care Management Important Message (Signed)
Important Message  Patient Details  Name: Jessica Erickson MRN: [id number] Date of Birth: 1926-10-30   Medicare Important Message Given:  Yes    Katrina Stack, RN 03/07/2018, 2:05 PM

## 2018-03-07 NOTE — Clinical Social Work Note (Signed)
Clinical Social Work Assessment  Patient Details  Name: Jessica Erickson MRN: [id number] Date of Birth: 1926-07-31  Date of referral:  03/07/18               Reason for consult:  Facility Placement                Permission sought to share information with:  Facility Sport and exercise psychologist, Family Supports Permission granted to share information::  Yes, Verbal Permission Granted  Name::     Jessica Erickson, Jessica Erickson [phone] or Jessica Erickson Daughter [phone] or Jessica Erickson, Jessica Erickson [phone]   Agency::  SNF admissions  Relationship::     Contact Information:     Housing/Transportation Living arrangements for the past 2 months:  [place] of Information:  Patient, Adult Children Patient Interpreter Needed:  None Criminal Activity/Legal Involvement Pertinent to Current Situation/Hospitalization:  No - Comment as needed Significant Relationships:  Adult Children Lives with:  Self Do you feel safe going back to the place where you live?  No Need for family participation in patient care:  Yes (Comment)  Care giving concerns:  Patient and family feel she needs some short term rehab before she is able to return back home.   Social Worker assessment / plan:  Patient is an 84 year old female who is alert and oriented x4.  Patient has been to rehab before and was at [facility] in the past.  Patient's daughter was at bedside, she is familiar with process of finding placement and what to expect at [facility].  CSW reminded them of how insurance will pay for stay.  CSW was given permission to begin bed search in [place].  Patient and family did not have any other questions.   Employment status:  Retired Nurse, adult PT Recommendations:  [place] / Referral to community resources:  [place]  Patient/Family's Response to care:  Patient and family are in agreement to having patient go to SNF for short term  rehab.  Patient/Family's Understanding of and Emotional Response to Diagnosis, Current Treatment, and Prognosis:  Patient and family are aware of current treatment plan and prognosis.  Emotional Assessment Appearance:  Appears stated age Attitude/Demeanor/Rapport:    Affect (typically observed):  Appropriate, Calm Orientation:  Oriented to Self, Oriented to Place, Oriented to  Time, Oriented to Situation Alcohol / Substance use:  Not Applicable Psych involvement (Current and /or in the community):  No (Comment)  Discharge Needs  Concerns to be addressed:  Lack of Support Readmission within the last 30 days:  Yes Current discharge risk:  Lack of support system Barriers to Discharge:  Continued Medical Work up   Anell Barr 03/07/2018, 5:29 PM

## 2018-03-07 NOTE — Progress Notes (Signed)
Physical Therapy Treatment Patient Details Name: Jessica Erickson MRN: [id number] DOB: [birth year] Today's Date: 03/07/2018    History of Present Illness Pt. is an 84 y.o. female who wasa dmitted to [facility] with persistent Orthostatic Hypotension. Pt. PMHx includes: Appendectomy, Brain Surgery, Breast CA, IVC Filter, Mild SOB with exertion, Asthma, DM, PE, DVT, Trigeminal Neuralgia.     PT Comments    Pt lightly sleeping.  Daughter reported poor sleep last night.  To edge of bed with rail and increased time.  She was able to sit without support but required supervision for safety. Stood with min assist and verbal cues for hand placements.  She was able to walk t the door this am with overall stooped posture, short shuffling steps.  She fatigued quickly and needed to sit.  She was allowed an extended rest period but she was unable to stand and walk further due to general fatigue.  Remained up in recliner at end of session.  Discussed at length discharge plan with with daughter.  She voiced significant concerns over discharge home given her current mobility level.  She is not functional with household ambulation and is not at her baseline.  Discussed with care manager and primary PT.  Agree with daughter and will change recommendation to SNF.   Follow Up Recommendations  SNF     Equipment Recommendations       Recommendations for Other Services       Precautions / Restrictions Precautions Precautions: Fall Restrictions Weight Bearing Restrictions: No    Mobility  Bed Mobility Overal bed mobility: Modified Independent Bed Mobility: Supine to Sit           General bed mobility comments: with rail and increased time  Transfers Overall transfer level: Needs assistance Equipment used: Rolling walker (2 wheeled) Transfers: Sit to/from Stand Sit to Stand: Min guard            Ambulation/Gait Ambulation/Gait assistance: Min assist;Min guard Ambulation Distance (Feet): 8  Feet Assistive device: Rolling walker (2 wheeled) Gait Pattern/deviations: Decreased step length - right;Decreased step length - left;Trunk flexed   Gait velocity interpretation: Below normal speed for age/gender General Gait Details: short shuffling steps, keeps walker too far in front of her fatigues at Medical illustrator    Modified Rankin (Stroke Patients Only)       Balance Overall balance assessment: Needs assistance Sitting-balance support: No upper extremity supported;Feet supported Sitting balance-Leahy Scale: Good     Standing balance support: Bilateral upper extremity supported Standing balance-Leahy Scale: Fair                              Cognition Arousal/Alertness: Awake/alert Behavior During Therapy: WFL for tasks assessed/performed Overall Cognitive Status: Within Functional Limits for tasks assessed                                        Exercises      General Comments        Pertinent Vitals/Pain Pain Assessment: No/denies pain    Home Living                      Prior Function            PT Goals (current goals can  now be found in the care plan section)      Frequency    Min 2X/week      PT Plan Discharge plan needs to be updated    Co-evaluation              AM-PAC PT "6 Clicks" Daily Activity  Outcome Measure  Difficulty turning over in bed (including adjusting bedclothes, sheets and blankets)?: A Little Difficulty moving from lying on back to sitting on the side of the bed? : A Little Difficulty sitting down on and standing up from a chair with arms (e.g., wheelchair, bedside commode, etc,.)?: Unable Help needed moving to and from a bed to chair (including a wheelchair)?: A Little Help needed walking in hospital room?: A Lot Help needed climbing 3-5 steps with a railing? : A Lot 6 Click Score: 14    End of Session Equipment Utilized During  Treatment: Gait belt Activity Tolerance: Patient limited by fatigue Patient left: in chair;with chair alarm set;with call bell/phone within reach;with family/visitor present         Time: 0938-1829 PT Time Calculation (min) (ACUTE ONLY): 15 min  Charges:  $Gait Training: 8-22 mins                    G Codes:       Chesley Noon, PTA 03/07/18, 10:01 AM

## 2018-03-07 NOTE — NC FL2 (Signed)
[place] LEVEL OF CARE SCREENING TOOL     IDENTIFICATION  Patient Name: Jessica Erickson Birthdate: 1926/03/13 Sex: female Admission Date (Current Location): 03/05/2018  [place] and [place] Number:  Engineering geologist and Address:  [facility], [address], [place], [place] [address]      Provider Number: [id number]  Attending Physician Name and Address:  Vaughan Basta, *  Relative Name and Phone Number:  Alisea, Matte [phone] or Taylor,Nancy Daughter [phone] or Lashae, Wollenberg [phone]     Current Level of Care: Hospital Recommended Level of Care: [place] Prior Approval Number:    Date Approved/Denied:   PASRR Number: [id number]  Discharge Plan: SNF    Current Diagnoses: Patient Active Problem List   Diagnosis Date Noted  . Orthostasis 03/05/2018  . Dizziness 03/04/2018  . COPD (chronic obstructive pulmonary disease) ([place]) 12/13/2017  . Anxiety and depression 12/13/2017  . Trigeminal neuralgia 12/13/2017  . GERD (gastroesophageal reflux disease) 12/13/2017  . Diabetes mellitus type 2, controlled ([place]) 12/13/2017  . Constipation 12/13/2017  . Insomnia 12/13/2017  . Hypothyroidism 12/13/2017  . UTI (urinary tract infection) 10/08/2015  . Collapsed vertebra, not elsewhere classified, thoracic region, initial encounter for fracture ([place]) 09/20/2015  . Basal cell carcinoma of scalp 04/06/2014  . Fothergill's neuralgia 08/05/2013  . Bladder infection, chronic 02/11/2013  . Female genuine stress incontinence 02/11/2013  . Incomplete bladder emptying 02/11/2013  . Intrinsic sphincter deficiency 02/11/2013  . Mixed incontinence 02/11/2013  . Excessive urination at night 02/11/2013  . Bladder retention 02/11/2013  . FOM (frequency of micturition) 02/11/2013  . Basal cell carcinoma of face 09/13/2011    Orientation RESPIRATION BLADDER Height & Weight     Self, Time, Situation,  Place  Normal Incontinent Weight: 207 lb 12.8 oz (94.3 kg) Height:  5\' 7"  (170.2 cm)  BEHAVIORAL SYMPTOMS/MOOD NEUROLOGICAL BOWEL NUTRITION STATUS      Continent Diet(Cardiac diet)  AMBULATORY STATUS COMMUNICATION OF NEEDS Skin   Limited Assist Verbally Normal                       Personal Care Assistance Level of Assistance  Bathing, Feeding, Dressing Bathing Assistance: Limited assistance Feeding assistance: Independent Dressing Assistance: Limited assistance     Functional Limitations Info  Sight, Hearing, Speech Sight Info: Adequate Hearing Info: Impaired Speech Info: Adequate    SPECIAL CARE FACTORS FREQUENCY  PT (By licensed PT), OT (By licensed OT)     PT Frequency: 5x a week OT Frequency: 5x a week            Contractures Contractures Info: Not present    Additional Factors Info  Code Status, Allergies, Insulin Sliding Scale Code Status Info: Full Code Allergies Info: PENICILLINS, SULFA ANTIBIOTICS, AMITIZA LUBIPROSTONE, ASPIRIN, PENICILLIN G    Insulin Sliding Scale Info: insulin aspart (novoLOG) injection 0-9 Units 3x a day with meals.       Current Medications (03/07/2018):  This is the current hospital active medication list Current Facility-Administered Medications  Medication Dose Route Frequency Provider Last Rate Last Dose  . acetaminophen (TYLENOL) tablet 650 mg  650 mg Oral Q6H PRN Amelia Jo, MD       Or  . acetaminophen (TYLENOL) suppository 650 mg  650 mg Rectal Q6H PRN Amelia Jo, MD      . aspirin EC tablet 81 mg  81 mg Oral Daily Amelia Jo, MD   81 mg at 03/07/18 0847  .  atorvastatin (LIPITOR) tablet 40 mg  40 mg Oral q1800 Amelia Jo, MD   40 mg at 03/06/18 1652  . bisacodyl (DULCOLAX) EC tablet 5 mg  5 mg Oral Daily PRN Amelia Jo, MD      . budesonide-formoterol [facility]) 160-4.5 MCG/ACT inhaler 2 puff  2 puff Inhalation BID Loletha Grayer, MD   2 puff at 03/07/18 0848  . calcium-vitamin D (OSCAL WITH D) 500-200  MG-UNIT per tablet 1 tablet  1 tablet Oral Daily Amelia Jo, MD   1 tablet at 03/07/18 0847  . carbamazepine (TEGRETOL) tablet 200 mg  200 mg Oral BID Amelia Jo, MD   200 mg at 03/07/18 4481  . guaiFENesin (MUCINEX) 12 hr tablet 1,200 mg  1,200 mg Oral BID Salary, Montell D, MD   1,200 mg at 03/07/18 0847   And  . dextromethorphan (DELSYM) 30 MG/5ML liquid 60 mg  60 mg Oral BID Loney Hering D, MD   60 mg at 03/07/18 0851  . docusate sodium (COLACE) capsule 100 mg  100 mg Oral BID Amelia Jo, MD   100 mg at 03/07/18 0845  . fluticasone (FLONASE) 50 MCG/ACT nasal spray 1 spray  1 spray Each Nare Daily Amelia Jo, MD   1 spray at 03/07/18 0848  . heparin injection 5,000 Units  5,000 Units Subcutaneous Q8H Amelia Jo, MD   5,000 Units at 03/07/18 1341  . HYDROcodone-acetaminophen (NORCO/VICODIN) 5-325 MG per tablet 1-2 tablet  1-2 tablet Oral Q4H PRN Amelia Jo, MD      . insulin aspart (novoLOG) injection 0-5 Units  0-5 Units Subcutaneous QHS Amelia Jo, MD      . insulin aspart (novoLOG) injection 0-9 Units  0-9 Units Subcutaneous TID WC Amelia Jo, MD   1 Units at 03/07/18 1157  . levothyroxine (SYNTHROID, LEVOTHROID) tablet 200 mcg  200 mcg Oral QAC breakfast Amelia Jo, MD   200 mcg at 03/07/18 8563  . Melatonin TABS 2.5 mg  2.5 mg Oral QHS PRN Amelia Jo, MD      . midodrine (PROAMATINE) tablet 10 mg  10 mg Oral TID WC Salary, Montell D, MD   10 mg at 03/07/18 1158  . multivitamin with minerals tablet 1 tablet  1 tablet Oral Daily Amelia Jo, MD   1 tablet at 03/07/18 [phone]  . ondansetron (ZOFRAN) tablet 4 mg  4 mg Oral Q6H PRN Amelia Jo, MD       Or  . ondansetron [facility]) injection 4 mg  4 mg Intravenous Q6H PRN Amelia Jo, MD      . pantoprazole (PROTONIX) EC tablet 40 mg  40 mg Oral Daily Amelia Jo, MD   40 mg at 03/07/18 0846  . polyethylene glycol (MIRALAX / GLYCOLAX) packet 17 g  17 g Oral Daily PRN Amelia Jo, MD      . polyethylene  glycol 0.4% and propylene glycol 0.3% (SYSTANE) ophthalmic gel  1 application Both Eyes QHS Loletha Grayer, MD   1 application at 02/63/78 2247  . sucralfate (CARAFATE) 1 GM/10ML suspension 1 g  1 g Oral TID PRN Amelia Jo, MD      . tiotropium [facility]) inhalation capsule 18 mcg  18 mcg Inhalation Daily Amelia Jo, MD   18 mcg at 03/07/18 0849  . traZODone (DESYREL) tablet 25 mg  25 mg Oral QHS PRN Amelia Jo, MD         Discharge Medications: Please see discharge summary for a list of discharge medications.  Relevant Imaging Results:  Relevant Lab Results:   Additional Information SSN [id number]  Ross Ludwig, [place]

## 2018-03-08 ENCOUNTER — Encounter: Payer: Self-pay | Admitting: *Deleted

## 2018-03-08 DIAGNOSIS — I951 Orthostatic hypotension: Secondary | ICD-10-CM | POA: Diagnosis not present

## 2018-03-08 LAB — GLUCOSE, CAPILLARY
Glucose-Capillary: 128 mg/dL — ABNORMAL HIGH (ref 65–99)
Glucose-Capillary: 137 mg/dL — ABNORMAL HIGH (ref 65–99)

## 2018-03-08 MED ORDER — DOCUSATE SODIUM 100 MG PO CAPS: 100.0000 mg | ORAL_CAPSULE | Freq: Two times a day (BID) | ORAL | 0 refills | Status: DC

## 2018-03-08 MED ORDER — TRAZODONE HCL 50 MG PO TABS: 25.0000 mg | ORAL_TABLET | Freq: Every evening | ORAL | 0 refills | Status: DC | PRN

## 2018-03-08 MED ORDER — CARBAMAZEPINE 200 MG PO TABS: 200.0000 mg | ORAL_TABLET | Freq: Two times a day (BID) | ORAL | 0 refills | Status: DC

## 2018-03-08 MED ORDER — HYDROCODONE-ACETAMINOPHEN 5-325 MG PO TABS: 1.0000 | ORAL_TABLET | Freq: Four times a day (QID) | ORAL | 0 refills | Status: DC | PRN

## 2018-03-08 MED ORDER — MIDODRINE HCL 10 MG PO TABS: 10.0000 mg | ORAL_TABLET | Freq: Three times a day (TID) | ORAL | 0 refills | Status: DC

## 2018-03-08 MED ORDER — BISACODYL 5 MG PO TBEC: 5.0000 mg | DELAYED_RELEASE_TABLET | Freq: Every day | ORAL | 0 refills | Status: DC | PRN

## 2018-03-08 NOTE — Progress Notes (Signed)
Report called to Velva Harman, Therapist, sports at [place]. EMS called. Family made aware of transfer.

## 2018-03-08 NOTE — Clinical Social Work Note (Addendum)
Patient to be d/c'ed today to [facility].  Patient and family agreeable to plans will transport via ems RN to call report [phone].  CSW updated daughter [name], [phone] that patient will be discharging today.   Evette Cristal, MSW, [place]

## 2018-03-08 NOTE — Progress Notes (Signed)
[place] at [place] NAME: Thedora Rings    MR#:  [id number]  DATE OF BIRTH:  [birth year]  SUBJECTIVE:  CHIEF COMPLAINT:   Chief Complaint  Patient presents with  . Weakness  Patient without complaint, family at the bedside, no events overnight per nursing staff, remains very weak, awaited rehab placement.  REVIEW OF SYSTEMS:  CONSTITUTIONAL: No fever, have fatigue or weakness.  EYES: No blurred or double vision.  EARS, NOSE, AND THROAT: No tinnitus or ear pain.  RESPIRATORY: No cough, shortness of breath, wheezing or hemoptysis.  CARDIOVASCULAR: No chest pain, orthopnea, edema.  GASTROINTESTINAL: No nausea, vomiting, diarrhea or abdominal pain.  GENITOURINARY: No dysuria, hematuria.  ENDOCRINE: No polyuria, nocturia,  HEMATOLOGY: No anemia, easy bruising or bleeding SKIN: No rash or lesion. MUSCULOSKELETAL: No joint pain or arthritis.   NEUROLOGIC: No tingling, numbness, weakness.  PSYCHIATRY: No anxiety or depression.   ROS  DRUG ALLERGIES:   Allergies  Allergen Reactions  . Penicillins Shortness Of Breath, Rash and Other (See Comments)    Has patient had a PCN reaction causing immediate rash, facial/tongue/throat swelling, SOB or lightheadedness with hypotension: Yes Has patient had a PCN reaction causing severe rash involving mucus membranes or skin necrosis: No Has patient had a PCN reaction that required hospitalization No Has patient had a PCN reaction occurring within the last 10 years: No If all of the above answers are "NO", then may proceed with Cephalosporin use.  . Sulfa Antibiotics Shortness Of Breath, Rash and Other (See Comments)  . Amitiza [Lubiprostone]     N/v/d  . Aspirin Other (See Comments)    Reaction:  Unknown   . Penicillin G Other (See Comments)    VITALS:  Blood pressure 115/64, pulse 71, temperature 98.9 F (37.2 C), temperature source Oral, resp. rate 14, height 5\' 7"  (1.702 m), weight 95.1 kg (209  lb 9.6 oz), SpO2 94 %.  PHYSICAL EXAMINATION:  GENERAL:  84 y.o.-year-old patient lying in the bed with no acute distress.  EYES: Pupils equal, round, reactive to light and accommodation. No scleral icterus. Extraocular muscles intact.  HEENT: Head atraumatic, normocephalic. Oropharynx and nasopharynx clear. Very hard of hearing. NECK:  Supple, no jugular venous distention. No thyroid enlargement, no tenderness.  LUNGS: Normal breath sounds bilaterally, no wheezing, rales,rhonchi or crepitation. No use of accessory muscles of respiration.  CARDIOVASCULAR: S1, S2 normal. No murmurs, rubs, or gallops.  ABDOMEN: Soft, nontender, nondistended. Bowel sounds present. No organomegaly or mass.  EXTREMITIES: No pedal edema, cyanosis, or clubbing.  NEUROLOGIC: Cranial nerves II through XII are intact. Muscle strength 3-4/5 in all extremities. Sensation intact. Gait not checked.  PSYCHIATRIC: The patient is alert and oriented x 3.  SKIN: No obvious rash, lesion, or ulcer.   Physical Exam LABORATORY PANEL:   CBC Recent Labs  Lab 03/06/18 0358  WBC 7.6  HGB 11.8*  HCT 36.3  PLT 101*   ------------------------------------------------------------------------------------------------------------------  Chemistries  Recent Labs  Lab 03/03/18 1630  03/06/18 0358  NA 140   < > 138  K 4.5   < > 4.3  CL 104   < > 109  CO2 26   < > 23  GLUCOSE 135*   < > 125*  BUN 20   < > 20  CREATININE 1.16*   < > 0.94  CALCIUM 8.8*   < > 8.0*  AST 26  --   --   ALT 19  --   --  ALKPHOS 65  --   --   BILITOT 0.4  --   --    < > = values in this interval not displayed.   ------------------------------------------------------------------------------------------------------------------  Cardiac Enzymes Recent Labs  Lab 03/05/18 0127 03/05/18 2030  TROPONINI <0.03 <0.03   ------------------------------------------------------------------------------------------------------------------  RADIOLOGY:   No results found.  ASSESSMENT AND PLAN:  1.    Acute recurrent orthostatic hypotension  Patient was discharged from the hospital 1-2 days before admission and has since bounced back Start Midodrine, stop all antihypertensive medications, encourage p.o. fluids/hydration, discontinue IV fluids, physical therapy to evaluate/treat  Per daughter-concern for side effects from Tegretol-decreased to twice daily   2.    Acute kidney injury Resolved with IV fluids for rehydration  Suspect may be related to poor p.o. Intake  3.    Acute on chronic deconditioning, chronic  Physical therapy to evaluate/treat  Increase nursing care as needed, aspiration/fall/skin care precautions  Awaited rehab.  4.    Chronic diabetes mellitus type 2  Stable  Continue current regiment   Disposition to inpatient rehab, skilled nursing facility.  All the records are reviewed and case discussed with Care Management/Social Workerr. Management plans discussed with the patient, family and they are in agreement.  CODE STATUS: DNR  TOTAL TIME TAKING CARE OF THIS PATIENT: 35 minutes.     POSSIBLE D/C IN 1-2 DAYS, DEPENDING ON CLINICAL CONDITION.   Vaughan Basta M.D on 03/08/2018   Between 7am to 6pm - Pager - [phone]  After 6pm go to [email or web address] - password [email or web address] [facility]  Office  [phone]  CC: Primary care physician; McLean-Scocuzza, Nino Glow, MD  Note: This dictation was prepared with Dragon dictation along with smaller phrase technology. Any transcriptional errors that result from this process are unintentional.

## 2018-03-08 NOTE — Clinical Social Work Note (Signed)
CSW received phone call from [facility] they have received insurance authorization for patient to go to SNF today if she is medically ready for discharge and orders have been received.  Jones Broom. [place], MSW, [place]  03/08/2018 9:53 AM

## 2018-03-08 NOTE — Discharge Summary (Signed)
[facility] NAME: Jessica Erickson    MR#:  [id number]  DATE OF BIRTH:  01/22/1926  DATE OF ADMISSION:  03/05/2018 ADMITTING PHYSICIAN: Amelia Jo, MD  DATE OF DISCHARGE: 03/08/2018   PRIMARY CARE PHYSICIAN: McLean-Scocuzza, Nino Glow, MD    ADMISSION DIAGNOSIS:  Orthostatic hypotension [I95.1]  DISCHARGE DIAGNOSIS:  Active Problems:   Orthostasis   SECONDARY DIAGNOSIS:   Past Medical History:  Diagnosis Date  . Asthma   . Bilateral swelling of feet    and legs  . CAD (coronary artery disease)   . Compression fracture of body of thoracic vertebra (HCC)    T12 09/18/15 MRI s/p fall   . Constipation   . COPD (chronic obstructive pulmonary disease) ([place])    previous CXR with chronic interstitial lung dz   . Diabetes ([place])    with neuropathy  . DVT (deep venous thrombosis) ([place])    right leg 10/2015 was on coumadin off as of 2017/2018 ; s/p IVC filter  . Fatty liver    09/15/15 also mildly dilated pancreatitic duct rec MRCP small sub cm cyst hemangioma speeln mild right hydronephrorossi and prox. hydroureter, kidney stones, mild scarring kidneys  . GERD (gastroesophageal reflux disease)    with small hiatal hernia   . Hard of hearing   . History of kidney problems   . Hyperlipidemia   . Hypertension   . Impaired mobility and ADLs    uses rolling walker has caretaker 24/7 at home  . Leg edema   . Neuropathy   . Osteoarthritis    DDD spine   . Pulmonary embolism ([place])    10/2015 off coumadin as of 04/2016  . Pulmonary HTN (HCC)    mild pulm HTN, echo 10/09/15 EF 93-81%WEXHB 1 dd, RV systolic pressure increased   . Recurrent UTI   . Skin cancer    BCC jawline and scalp   . Thyroid disease    follows [facility]  . TIA (transient ischemic attack)    MRI 2009/2010 neg stroke   . Trigeminal neuralgia    Dr. Tomi Bamberger s/p gamma knife x 2, on Tegretol since 2011/2012 no increase in dose >200 mg bid rec per  family per neurology   . Urinary, incontinence, stress female    Dr Erlene Quan urology     [facility] COURSE:   1.  Acute recurrent orthostatic hypotension  Patient was discharged from the hospital 1-2 days before admission and has since bounced back Start Midodrine, stop all antihypertensive medications, encourage p.o. fluids/hydration, discontinue IV fluids, physical therapy to evaluate/treat  Per daughter-concern for side effects from Tegretol-decreased to twice daily   Pt is much more alert and feels back to baseline, very weak.  2.  Acute kidney injury Resolved with IV fluids for rehydration  Suspect may be related to poor p.o. Intake  3.  Acute on chronic deconditioning, chronic  Physical therapy to evaluate/treat  Increase nursing care as needed, aspiration/fall/skin care precautions  Awaited rehab.  4.  Chronic diabetes mellitus type 2  Stable  Continue current regiment     DISCHARGE CONDITIONS:   Stable.  CONSULTS OBTAINED:    DRUG ALLERGIES:   Allergies  Allergen Reactions  . Penicillins Shortness Of Breath, Rash and Other (See Comments)    Has patient had a PCN reaction causing immediate rash, facial/tongue/throat swelling, SOB or lightheadedness with hypotension: Yes Has patient had a PCN reaction causing severe rash involving mucus membranes  or skin necrosis: No Has patient had a PCN reaction that required hospitalization No Has patient had a PCN reaction occurring within the last 10 years: No If all of the above answers are "NO", then may proceed with Cephalosporin use.  . Sulfa Antibiotics Shortness Of Breath, Rash and Other (See Comments)  . Amitiza [Lubiprostone]     N/v/d  . Aspirin Other (See Comments)    Reaction:  Unknown   . Penicillin G Other (See Comments)    DISCHARGE MEDICATIONS:   Allergies as of 03/08/2018      Reactions   Penicillins Shortness Of Breath, Rash, Other (See Comments)   Has patient had a PCN reaction causing  immediate rash, facial/tongue/throat swelling, SOB or lightheadedness with hypotension: Yes Has patient had a PCN reaction causing severe rash involving mucus membranes or skin necrosis: No Has patient had a PCN reaction that required hospitalization No Has patient had a PCN reaction occurring within the last 10 years: No If all of the above answers are "NO", then may proceed with Cephalosporin use.   Sulfa Antibiotics Shortness Of Breath, Rash, Other (See Comments)   Amitiza [lubiprostone]    N/v/d   Aspirin Other (See Comments)   Reaction:  Unknown    Penicillin G Other (See Comments)      Medication List    STOP taking these medications   cetirizine 10 MG tablet Commonly known as:  ZYRTEC   furosemide 20 MG tablet Commonly known as:  LASIX   metFORMIN 500 MG tablet Commonly known as:  GLUCOPHAGE     TAKE these medications   acetaminophen 325 MG tablet Commonly known as:  TYLENOL Take 2 tablets (650 mg total) by mouth every 6 (six) hours as needed. What changed:    how much to take  when to take this   aspirin EC 81 MG tablet Take 81 mg by mouth daily.   atorvastatin 40 MG tablet Commonly known as:  LIPITOR Take 40 mg by mouth daily at 6 PM.   bisacodyl 5 MG EC tablet Commonly known as:  DULCOLAX Take 1 tablet (5 mg total) by mouth daily as needed for moderate constipation.   budesonide-formoterol 160-4.5 MCG/ACT inhaler Commonly known as:  SYMBICORT Inhale 2 puffs into the lungs 2 (two) times daily.   CALCIUM 600+D 600-400 MG-UNIT tablet Generic drug:  Calcium Carbonate-Vitamin D Take 1 tablet by mouth daily.   carbamazepine 200 MG tablet Commonly known as:  TEGRETOL Take 1 tablet (200 mg total) by mouth 2 (two) times daily.   docusate sodium 100 MG capsule Commonly known as:  COLACE Take 1 capsule (100 mg total) by mouth 2 (two) times daily.   fluticasone 50 MCG/ACT nasal spray Commonly known as:  FLONASE Place 1 spray into both nostrils daily.    guaiFENesin 600 MG 12 hr tablet Commonly known as:  MUCINEX Take 600 mg by mouth daily. What changed:  Another medication with the same name was removed. Continue taking this medication, and follow the directions you see here.   HYDROcodone-acetaminophen 5-325 MG tablet Commonly known as:  NORCO/VICODIN Take 1 tablet by mouth every 6 (six) hours as needed for moderate pain or severe pain.   levothyroxine 200 MCG tablet Commonly known as:  SYNTHROID, LEVOTHROID Daily before breakfast Monday-Saturday. DO NOT TAKE ON SUNDAYS   MELATONIN PO Take 1 tablet by mouth at bedtime as needed (SLEEP).   midodrine 10 MG tablet Commonly known as:  PROAMATINE Take 1 tablet (10 mg total)  by mouth 3 (three) times daily with meals.   multivitamin with minerals Tabs tablet Take 1 tablet by mouth daily.   pantoprazole 40 MG tablet Commonly known as:  PROTONIX Take 1 tablet (40 mg total) by mouth daily.   Polyethyl Glycol-Propyl Glycol 0.4-0.3 % Soln Place 1 drop into both eyes at bedtime.   polyethylene glycol powder powder Commonly known as:  GLYCOLAX/MIRALAX Take 17 g by mouth daily as needed for mild constipation.   sucralfate 1 GM/10ML suspension Commonly known as:  CARAFATE Take 10 mLs (1 g total) by mouth 4 (four) times daily -  with meals and at bedtime. What changed:    when to take this  reasons to take this   tiotropium 18 MCG inhalation capsule Commonly known as:  SPIRIVA Place 1 capsule (18 mcg total) into inhaler and inhale daily.   traZODone 50 MG tablet Commonly known as:  DESYREL Take 0.5 tablets (25 mg total) by mouth at bedtime as needed for sleep.        DISCHARGE INSTRUCTIONS:    Follow with PMD in 1-2 weeks.  If you experience worsening of your admission symptoms, develop shortness of breath, life threatening emergency, suicidal or homicidal thoughts you must seek medical attention immediately by calling 911 or calling your MD immediately  if symptoms  less severe.  You Must read complete instructions/literature along with all the possible adverse reactions/side effects for all the Medicines you take and that have been prescribed to you. Take any new Medicines after you have completely understood and accept all the possible adverse reactions/side effects.   Please note  You were cared for by a hospitalist during your hospital stay. If you have any questions about your discharge medications or the care you received while you were in the hospital after you are discharged, you can call the unit and asked to speak with the hospitalist on call if the hospitalist that took care of you is not available. Once you are discharged, your primary care physician will handle any further medical issues. Please note that NO REFILLS for any discharge medications will be authorized once you are discharged, as it is imperative that you return to your primary care physician (or establish a relationship with a primary care physician if you do not have one) for your aftercare needs so that they can reassess your need for medications and monitor your lab values.    Today   CHIEF COMPLAINT:   Chief Complaint  Patient presents with  . Weakness    HISTORY OF PRESENT ILLNESS:  Jessica Erickson  is a 84 y.o. female with a known history of asthma, coronary artery disease, diabetes, PE, DVT, trigeminal neuralgia and other many comorbidities. Patient was brought to the hospital again for persistent orthostatic hypotension, drowsiness and generalized weakness.  She was just discharged from the hospital earlier in the day.  No fever, chills, no nausea/vomiting/diarrhea. Patient was admitted for the same reason, 2 days ago and found to be dehydrated.  She improved with IV fluids and was discharged home with family support.  All other workup including EKG, troponin level, brain CT, UA and chest x-ray were negative for any acute changes.  The only new change in her medications was  increasing the Tegretol dose from twice a day to 3 times a day in the past month. Blood test done in the emergency room including CBC and CMP, are again unremarkable except for slightly elevated creatinine level. Per family, patient has had issues  in the past with not being able to tolerate 3 times a day Tegretol.   VITAL SIGNS:  Blood pressure 115/64, pulse 71, temperature 98.9 F (37.2 C), temperature source Oral, resp. rate 14, height 5\' 7"  (1.702 m), weight 95.1 kg (209 lb 9.6 oz), SpO2 94 %.  I/O:    Intake/Output Summary (Last 24 hours) at 03/08/2018 1054 Last data filed at 03/08/2018 0553 Gross per 24 hour  Intake -  Output 0 ml  Net 0 ml    PHYSICAL EXAMINATION:   GENERAL:  84 y.o.-year-old patient lying in the bed with no acute distress.  EYES: Pupils equal, round, reactive to light and accommodation. No scleral icterus. Extraocular muscles intact.  HEENT: Head atraumatic, normocephalic. Oropharynx and nasopharynx clear. Very hard of hearing. NECK:  Supple, no jugular venous distention. No thyroid enlargement, no tenderness.  LUNGS: Normal breath sounds bilaterally, no wheezing, rales,rhonchi or crepitation. No use of accessory muscles of respiration.  CARDIOVASCULAR: S1, S2 normal. No murmurs, rubs, or gallops.  ABDOMEN: Soft, nontender, nondistended. Bowel sounds present. No organomegaly or mass.  EXTREMITIES: No pedal edema, cyanosis, or clubbing.  NEUROLOGIC: Cranial nerves II through XII are intact. Muscle strength 3-4/5 in all extremities. Sensation intact. Gait not checked.  PSYCHIATRIC: The patient is alert and oriented x 3.  SKIN: No obvious rash, lesion, or ulcer.     DATA REVIEW:   CBC Recent Labs  Lab 03/06/18 0358  WBC 7.6  HGB 11.8*  HCT 36.3  PLT 101*    Chemistries  Recent Labs  Lab 03/03/18 1630  03/06/18 0358  NA 140   < > 138  K 4.5   < > 4.3  CL 104   < > 109  CO2 26   < > 23  GLUCOSE 135*   < > 125*  BUN 20   < > 20  CREATININE  1.16*   < > 0.94  CALCIUM 8.8*   < > 8.0*  AST 26  --   --   ALT 19  --   --   ALKPHOS 65  --   --   BILITOT 0.4  --   --    < > = values in this interval not displayed.    Cardiac Enzymes Recent Labs  Lab 03/05/18 2030  TROPONINI <0.03    Microbiology Results  Results for orders placed or performed in visit on 12/24/17  Urine Culture     Status: Abnormal   Collection Time: 12/24/17  9:31 AM  Result Value Ref Range Status   MICRO NUMBER: [id number]  Final   SPECIMEN QUALITY: ADEQUATE  Final   Sample Source NOT GIVEN  Final   STATUS: FINAL  Final   ISOLATE 1: Klebsiella pneumoniae (A)  Final      Susceptibility   Klebsiella pneumoniae - URINE CULTURE, REFLEX    AMOX/CLAVULANIC <=2 Sensitive     AMPICILLIN  Resistant     AMPICILLIN/SULBACTAM <=2 Sensitive     CEFAZOLIN* <=4 Not Reportable      * For infections other than uncomplicated UTIcaused by E. coli, K. pneumoniae or P. mirabilis:Cefazolin is resistant if MIC > or = 8 mcg/mL.(Distinguishing susceptible versus intermediatefor isolates with MIC < or = 4 mcg/mL requiresadditional testing.)For uncomplicated UTI caused by E. coli,K. pneumoniae or P. mirabilis: Cefazolin issusceptible if MIC <32 mcg/mL and predictssusceptible to the oral agents cefaclor, cefdinir,cefpodoxime, cefprozil, cefuroxime, cephalexinand loracarbef.    CEFEPIME <=1 Sensitive     CEFTRIAXONE <=1 Sensitive  CIPROFLOXACIN <=0.25 Sensitive     LEVOFLOXACIN <=0.12 Sensitive     ERTAPENEM <=0.5 Sensitive     GENTAMICIN <=1 Sensitive     IMIPENEM <=0.25 Sensitive     NITROFURANTOIN 64 Intermediate     PIP/TAZO <=4 Sensitive     TOBRAMYCIN <=1 Sensitive     TRIMETH/SULFA* <=20 Sensitive      * For infections other than uncomplicated UTIcaused by E. coli, K. pneumoniae or P. mirabilis:Cefazolin is resistant if MIC > or = 8 mcg/mL.(Distinguishing susceptible versus intermediatefor isolates with MIC < or = 4 mcg/mL requiresadditional testing.)For uncomplicated  UTI caused by E. coli,K. pneumoniae or P. mirabilis: Cefazolin issusceptible if MIC <32 mcg/mL and predictssusceptible to the oral agents cefaclor, cefdinir,cefpodoxime, cefprozil, cefuroxime, cephalexinand loracarbef.Legend:S = Susceptible  I = IntermediateR = Resistant  NS = Not susceptible* = Not tested  NR = Not reported**NN = See antimicrobic comments    RADIOLOGY:  No results found.  EKG:   Orders placed or performed during the hospital encounter of 03/05/18  . ED EKG  . ED EKG      Management plans discussed with the patient, family and they are in agreement.  CODE STATUS:     Code Status Orders  (From admission, onward)        Start     Ordered   03/06/18 0109  Full code  Continuous     03/06/18 0108    Code Status History    Date Active Date Inactive Code Status Order ID Comments User Context   03/04/2018 2230 03/05/2018 1742 Full Code [id number]  Amelia Jo, MD Inpatient   10/08/2015 2112 10/13/2015 1651 Full Code [id number]  Theodoro Grist, MD Inpatient   09/16/2015 0234 09/19/2015 0236 Full Code [id number]  Fritzi Mandes, MD Inpatient    Advance Directive Documentation     Most Recent Value  Type of Advance Directive  Healthcare Power of Attorney  Pre-existing out of facility DNR order (yellow form or pink MOST form)  -  "MOST" Form in Place?  -      TOTAL TIME TAKING CARE OF THIS PATIENT: 35 minutes.    Vaughan Basta M.D on 03/08/2018 at 10:54 AM  Between 7am to 6pm - Pager - [phone]  After 6pm go to [email or web address] - password [email or web address] [facility]  Office  [phone]  CC: Primary care physician; McLean-Scocuzza, Nino Glow, MD   Note: This dictation was prepared with Dragon dictation along with smaller phrase technology. Any transcriptional errors that result from this process are unintentional.

## 2018-03-11 ENCOUNTER — Other Ambulatory Visit: Payer: Self-pay

## 2018-03-11 MED ORDER — HYDROCODONE-ACETAMINOPHEN 5-325 MG PO TABS: 1.0000 | ORAL_TABLET | Freq: Four times a day (QID) | ORAL | 0 refills | Status: DC | PRN

## 2018-03-11 NOTE — Telephone Encounter (Signed)
Rx sent to [facility] phone : [phone] , fax : [phone]  

## 2018-03-12 ENCOUNTER — Telehealth: Payer: Self-pay

## 2018-03-12 NOTE — Telephone Encounter (Signed)
Left message for caregiver,son, and or daughter of patient to return call back.  [facility] can give/recieve information from Dr. Olivia Mackie

## 2018-03-12 NOTE — Telephone Encounter (Signed)
-----   Message from Delorise Jackson, MD sent at 03/11/2018 11:41 PM EDT ----- Please call son or daughter [name] or caretaker or pt to see how pt is doing since being in the hospital?  Tell them thinking about her I hope she is feeling better    Thanks tMS

## 2018-03-14 ENCOUNTER — Non-Acute Institutional Stay (SKILLED_NURSING_FACILITY): Payer: Medicare Other | Admitting: Gerontology

## 2018-03-14 ENCOUNTER — Encounter: Payer: Self-pay | Admitting: Gerontology

## 2018-03-14 DIAGNOSIS — R600 Localized edema: Secondary | ICD-10-CM

## 2018-03-14 DIAGNOSIS — I951 Orthostatic hypotension: Secondary | ICD-10-CM

## 2018-03-14 DIAGNOSIS — R609 Edema, unspecified: Secondary | ICD-10-CM

## 2018-03-14 NOTE — Progress Notes (Signed)
Location:   [facility] Room Number: [place] of Service:  SNF [phone]) Provider:  Toni Arthurs, NP-C  McLean-Scocuzza, Nino Glow, MD  Patient Care Team: McLean-Scocuzza, Nino Glow, MD as PCP - General (Internal Medicine)  Extended Emergency Contact Information Primary Emergency Contact: Droke,[name] D Address: [address]          [place], [place] [address] Johnnette Litter of [place] Phone: [phone] Relation: Son Secondary Emergency Contact: Cornelia Copa [place] of [place] Phone: [phone] Relation: Daughter  Code Status:  FULL Goals of care: Advanced Directive information Advanced Directives 03/14/2018  Does Patient Have a Medical Advance Directive? No  Type of Advance Directive -  Does patient want to make changes to medical advance directive? No - Patient declined  Copy of [place] in Chart? -  Would patient like information on creating a medical advance directive? -     Chief Complaint  Patient presents with  . Medical Management of Chronic Issues    Routine Visit    HPI:  Pt is a 84 y.o. female seen today for medical management of chronic diseases. Pt was admitted to the facility for rehab following hospitalization for Orthostatic hypotension with generalized weakness and deconditioning. Pt has been participating in PT/OT. Pt reports pain is well controlled on current regimen. No recent episodes of hypotension. Denies chest pain or shortness of breath. Pt also c/o peripheral edema of BLE. TED hose in place. Pt is elevating legs when at rest. She does c/o mild DOE. Intermittent cough. Typically non-productive. Occasional expectoration of clear/white sputum. No fever. Pt denies n/v/d/f/c/cp/sob/ ha/abd pian/dizziness/congestion. Pt reports her appetite is fair. She is voiding well and having regular BMs. VSS. No other complaints.      Past Medical History:  Diagnosis Date  . Acoustic neuroma ([place])   .  Allergy   . Asthma   . Bilateral swelling of feet    and legs  . Bladder infection   . CAD (coronary artery disease)   . Cataract   . Change in voice   . Compression fracture of body of thoracic vertebra (HCC)    T12 09/18/15 MRI s/p fall   . Constipation   . COPD (chronic obstructive pulmonary disease) ([place])    previous CXR with chronic interstitial lung dz   . CVA (cerebral vascular accident) ([place])   . Depression   . Diabetes ([place])    with neuropathy  . Diabetes mellitus, type 2 ([place])   . Diarrhea   . Double vision   . DVT (deep venous thrombosis) ([place])    right leg 10/2015 was on coumadin off as of 2017/2018 ; s/p IVC filter  . Enuresis   . Eye pain, right   . Fall   . Fatty liver    09/15/15 also mildly dilated pancreatitic duct rec MRCP small sub cm cyst hemangioma speeln mild right hydronephrorossi and prox. hydroureter, kidney stones, mild scarring kidneys  . Female stress incontinence   . Flank pain   . GERD (gastroesophageal reflux disease)    with small hiatal hernia   . Hard of hearing   . Heart disease   . History of kidney problems   . Hyperlipidemia    mixed  . Hypertension   . Hypothyroidism, postsurgical   . Impaired mobility and ADLs    uses rolling walker has caretaker 24/7 at home  . Leg edema   . Mixed incontinence urge and stress (female)(female)   .  Neuropathy   . Osteoarthritis    DDD spine   . Osteoporosis with fracture    T12 compression fracture  . Photophobia   . Pulmonary embolism ([place])    10/2015 off coumadin as of 04/2016  . Pulmonary HTN (HCC)    mild pulm HTN, echo 10/09/15 EF 16-10%RUEAV 1 dd, RV systolic pressure increased   . Recurrent UTI   . Sinus pressure   . Skin cancer    BCC jawline and scalp   . Thyroid disease    follows [facility]  . TIA (transient ischemic attack)    MRI 2009/2010 neg stroke   . Trigeminal neuralgia    Dr. Tomi Bamberger s/p gamma knife x 2, on Tegretol since 2011/2012 no increase in dose >200 mg bid  rec per family per neurology   . Urinary frequency   . Urinary, incontinence, stress female    Dr Erlene Quan urology    Past Surgical History:  Procedure Laterality Date  . APPENDECTOMY     as a child, open  . BRAIN SURGERY     schwnnoma removal 1996   . brain tumor surgery    . BREAST SURGERY     breast bx  . CATARACT EXTRACTION    . CHOLECYSTECTOMY    . EYE SURGERY     cataract  . IVC FILTER PLACEMENT ([facility] HX)     Dr. Lucky Cowboy 10/2015   . LAPAROSCOPIC TUBAL LIGATION    . MOHS SURGERY     scalp 04/2014   . PERIPHERAL VASCULAR CATHETERIZATION N/A 10/11/2015   Procedure: IVC Filter Insertion;  Surgeon: Algernon Huxley, MD;  Location: [facility];  Service: Cardiovascular;  Laterality: N/A;  . PUBOVAGINAL SLING    . THROAT SURGERY    . THYROID SURGERY     tumor around vocal cords   . TOOTH EXTRACTION     winter 2018   . TOTAL THYROIDECTOMY  1976    Allergies  Allergen Reactions  . Penicillins Shortness Of Breath, Rash and Other (See Comments)    Has patient had a PCN reaction causing immediate rash, facial/tongue/throat swelling, SOB or lightheadedness with hypotension: Yes Has patient had a PCN reaction causing severe rash involving mucus membranes or skin necrosis: No Has patient had a PCN reaction that required hospitalization No Has patient had a PCN reaction occurring within the last 10 years: No If all of the above answers are "NO", then may proceed with Cephalosporin use.  . Sulfa Antibiotics Shortness Of Breath, Rash and Other (See Comments)  . Amitiza [Lubiprostone]     N/v/d  . Aspirin Other (See Comments)    Reaction:  Unknown  Other reaction(s): Bleeding (intolerance) Per patient " causes nose to bleed" Can take 81 mg daily without any complications Other reaction(s): "bloody nose"   . Penicillin G Other (See Comments)    Allergies as of 03/14/2018      Reactions   Penicillins Shortness Of Breath, Rash, Other (See Comments)   Has patient had a PCN  reaction causing immediate rash, facial/tongue/throat swelling, SOB or lightheadedness with hypotension: Yes Has patient had a PCN reaction causing severe rash involving mucus membranes or skin necrosis: No Has patient had a PCN reaction that required hospitalization No Has patient had a PCN reaction occurring within the last 10 years: No If all of the above answers are "NO", then may proceed with Cephalosporin use.   Sulfa Antibiotics Shortness Of Breath, Rash, Other (See Comments)   Amitiza [  lubiprostone]    N/v/d   Aspirin Other (See Comments)   Reaction:  Unknown  Other reaction(s): Bleeding (intolerance) Per patient " causes nose to bleed" Can take 81 mg daily without any complications Other reaction(s): "bloody nose"   Penicillin G Other (See Comments)      Medication List        Accurate as of 03/14/18 11:37 AM. Always use your most recent med list.          acetaminophen 325 MG tablet Commonly known as:  TYLENOL Take 650 mg by mouth every 6 (six) hours as needed.   aspirin EC 81 MG tablet Take 81 mg by mouth daily.   atorvastatin 40 MG tablet Commonly known as:  LIPITOR Take 40 mg by mouth daily at 6 PM.   bisacodyl 5 MG EC tablet Commonly known as:  DULCOLAX Take 1 tablet (5 mg total) by mouth daily as needed for moderate constipation.   budesonide-formoterol 160-4.5 MCG/ACT inhaler Commonly known as:  SYMBICORT Inhale 2 puffs into the lungs 2 (two) times daily.   CALCIUM 600+D 600-400 MG-UNIT tablet Generic drug:  Calcium Carbonate-Vitamin D Take 1 tablet by mouth daily.   carbamazepine 200 MG tablet Commonly known as:  TEGRETOL Take 1 tablet (200 mg total) by mouth 2 (two) times daily.   docusate sodium 100 MG capsule Commonly known as:  COLACE Take 1 capsule (100 mg total) by mouth 2 (two) times daily.   fluticasone 50 MCG/ACT nasal spray Commonly known as:  FLONASE Place 1 spray into both nostrils daily.   guaiFENesin 600 MG 12 hr  tablet Commonly known as:  MUCINEX Take 600 mg by mouth daily.   HYDROcodone-acetaminophen 5-325 MG tablet Commonly known as:  NORCO/VICODIN Take 1 tablet by mouth every 6 (six) hours as needed for moderate pain or severe pain.   insulin regular 100 units/mL injection Commonly known as:  NOVOLIN R,HUMULIN R Inject 0-12 Units into the skin 3 (three) times daily before meals. 100 unit/mL; amt: Per Sliding Scale; If Blood Sugar is 176 to 250, give 3 Units.If Blood Sugar is 251 to 325, give 6 Units.If Blood Sugar is 326 to 450, give 10 Units. If Blood Sugar is greater than 450, give 12 Units and recheck in 1 hour. Notify MD/NP if >450 after recheck. Check blood sugar AC & HS   levothyroxine 200 MCG tablet Commonly known as:  SYNTHROID, LEVOTHROID Take 200 mcg by mouth daily before breakfast. Except on Sunday. Do not take with other medications   magnesium hydroxide 400 MG/5ML suspension Commonly known as:  MILK OF MAGNESIA Take 30 mLs by mouth every 4 (four) hours as needed. Constipation/ no BM for 2 days   metFORMIN 500 MG 24 hr tablet Commonly known as:  GLUCOPHAGE-XR Take 500 mg by mouth daily with breakfast.   midodrine 10 MG tablet Commonly known as:  PROAMATINE Take 1 tablet (10 mg total) by mouth 3 (three) times daily with meals.   multivitamin with minerals Tabs tablet Take 1 tablet by mouth daily.   pantoprazole 40 MG tablet Commonly known as:  PROTONIX Take 1 tablet (40 mg total) by mouth daily.   Polyethyl Glycol-Propyl Glycol 0.4-0.3 % Soln Place 1 drop into both eyes at bedtime.   polyethylene glycol powder powder Commonly known as:  GLYCOLAX/MIRALAX Take 17 g by mouth daily as needed for mild constipation.   sucralfate 1 GM/10ML suspension Commonly known as:  CARAFATE Take 1 g by mouth 4 (four) times daily -  with meals and at bedtime.   tiotropium 18 MCG inhalation capsule Commonly known as:  SPIRIVA Place 1 capsule (18 mcg total) into inhaler and inhale  daily.   traZODone 50 MG tablet Commonly known as:  DESYREL Take 0.5 tablets (25 mg total) by mouth at bedtime as needed for sleep.       Review of Systems  Constitutional: Negative for activity change, appetite change, chills, diaphoresis and fever.  HENT: Negative for congestion, mouth sores, nosebleeds, postnasal drip, sneezing, sore throat, trouble swallowing and voice change.   Respiratory: Positive for shortness of breath. Negative for apnea, cough, choking, chest tightness and wheezing.   Cardiovascular: Positive for leg swelling. Negative for chest pain and palpitations.  Gastrointestinal: Negative for abdominal distention, abdominal pain, constipation, diarrhea and nausea.  Genitourinary: Negative for difficulty urinating, dysuria, frequency and urgency.  Musculoskeletal: Positive for arthralgias (typical arthritis). Negative for back pain, gait problem and myalgias.  Skin: Negative for color change, pallor, rash and wound.  Neurological: Negative for dizziness, tremors, syncope, speech difficulty, weakness, numbness and headaches.  Psychiatric/Behavioral: Negative for agitation and behavioral problems.  All other systems reviewed and are negative.   Immunization History  Administered Date(s) Administered  . Influenza-Unspecified 08/05/2015, 09/02/2017   Pertinent  Health Maintenance Due  Topic Date Due  . FOOT EXAM  04/22/1936  . OPHTHALMOLOGY EXAM  04/22/1936  . DEXA SCAN  04/22/1993  . PNA vac Low Risk Adult (1 of 2 - PCV13) 04/22/1993  . HEMOGLOBIN A1C  06/23/2018  . INFLUENZA VACCINE  07/04/2018  . URINE MICROALBUMIN  12/24/2018   No flowsheet data found. Functional Status Survey:    Vitals:   03/14/18 0952  BP: 136/67  Pulse: 67  Resp: 18  Temp: 97.9 F (36.6 C)  TempSrc: Oral  SpO2: 97%  Weight: 208 lb 6.4 oz (94.5 kg)  Height: '5\' 7"'  (1.702 m)   Body mass index is 32.64 kg/m. Physical Exam  Constitutional: She is oriented to person, place, and  time. Vital signs are normal. She appears well-developed and well-nourished. She is active and cooperative. She does not appear ill. No distress.  HENT:  Head: Normocephalic and atraumatic.  Mouth/Throat: Uvula is midline, oropharynx is clear and moist and mucous membranes are normal. Mucous membranes are not pale, not dry and not cyanotic.  Eyes: Pupils are equal, round, and reactive to light. Conjunctivae, EOM and lids are normal.  Neck: Trachea normal, normal range of motion and full passive range of motion without pain. Neck supple. No JVD present. No tracheal deviation, no edema and no erythema present. No thyromegaly present.  Cardiovascular: Normal rate, regular rhythm, normal heart sounds, intact distal pulses and normal pulses. Exam reveals no gallop, no distant heart sounds and no friction rub.  No murmur heard. Pulses:      Dorsalis pedis pulses are 2+ on the right side, and 2+ on the left side.  2-3+ BLE edema  Pulmonary/Chest: Effort normal. No accessory muscle usage. No respiratory distress. She has no decreased breath sounds. She has no wheezes. She has rhonchi in the right lower field and the left lower field. She has no rales. She exhibits no tenderness.  Persistent cough  Abdominal: Soft. Normal appearance and bowel sounds are normal. She exhibits no distension and no ascites. There is no tenderness.  Musculoskeletal: Normal range of motion. She exhibits no edema or tenderness.  Expected osteoarthritis, stiffness; Bilateral Calves soft, supple. Negative Homan's Sign. B- pedal pulses equal; generalized weakness  Neurological:  She is alert and oriented to person, place, and time. She has normal strength.  Dyspnea on exertion  Skin: Skin is warm, dry and intact. She is not diaphoretic. No cyanosis. No pallor. Nails show no clubbing.  Psychiatric: She has a normal mood and affect. Her speech is normal and behavior is normal. Judgment and thought content normal. Cognition and memory  are normal.  Nursing note and vitals reviewed.   Labs reviewed: Recent Labs    03/05/18 0127 03/05/18 2030 03/06/18 0358  NA 139 137 138  K 4.6 4.3 4.3  CL 108 107 109  CO2 '25 23 23  ' GLUCOSE 136* 175* 125*  BUN 20 23* 20  CREATININE 0.96 1.14* 0.94  CALCIUM 8.3* 7.8* 8.0*   Recent Labs    12/24/17 0931 03/03/18 1630  AST 17 26  ALT 12 19  ALKPHOS 52 65  BILITOT 0.5 0.4  PROT 6.3 7.1  ALBUMIN 3.6 3.7   Recent Labs    03/03/18 1630 03/04/18 1955 03/05/18 0127 03/05/18 2030 03/06/18 0358  WBC 7.3 7.5 8.3 8.5 7.6  NEUTROABS 5.2 5.5  --  6.6*  --   HGB 13.8 12.6 12.8 13.3 11.8*  HCT 42.7 38.7 39.2 40.5 36.3  MCV 95.7 95.0 94.5 94.8 94.3  PLT 198 126* 127* 108* 101*   Lab Results  Component Value Date   TSH 0.25 (L) 12/24/2017   Lab Results  Component Value Date   HGBA1C 6.3 12/24/2017   Lab Results  Component Value Date   CHOL 155 12/24/2017   HDL 57.10 12/24/2017   LDLCALC 82 12/24/2017   TRIG 77.0 12/24/2017   CHOLHDL 3 12/24/2017    Significant Diagnostic Results in last 30 days:  Dg Pelvis 1-2 Views  Result Date: 03/03/2018 CLINICAL DATA:  Pelvic pain after riding bike. EXAM: PELVIS - 1-2 VIEW COMPARISON:  CT scan of January 10, 2016. FINDINGS: There is no evidence of pelvic fracture or diastasis. No pelvic bone lesions are seen. IMPRESSION: Normal pelvis. Electronically Signed   By: Marijo Conception, M.D.   On: 03/03/2018 17:27   Ct Head Wo Contrast  Result Date: 03/05/2018 CLINICAL DATA:  Weakness and orthostatic hypotension EXAM: CT HEAD WITHOUT CONTRAST TECHNIQUE: Contiguous axial images were obtained from the base of the skull through the vertex without intravenous contrast. COMPARISON:  08/28/2009 FINDINGS: Brain: No evidence of acute infarction, hemorrhage, hydrocephalus, extra-axial collection or mass lesion/mass effect. Vascular: No hyperdense vessel or unexpected calcification. Skull: Postsurgical defect is noted in the left occipital region  Sinuses/Orbits: Within normal limits. Other: None IMPRESSION: Postsurgical changes stable from the prior exam. No acute intracranial abnormality noted. Electronically Signed   By: Inez Catalina M.D.   On: 03/05/2018 20:58   Ct Abdomen Pelvis W Contrast  Result Date: 03/03/2018 CLINICAL DATA:  Pelvic pain.  No reported injury.  Unable to walk. EXAM: CT ABDOMEN AND PELVIS WITH CONTRAST TECHNIQUE: Multidetector CT imaging of the abdomen and pelvis was performed using the standard protocol following bolus administration of intravenous contrast. CONTRAST:  24m ISOVUE-300 IOPAMIDOL (ISOVUE-300) INJECTION 61% COMPARISON:  Pelvic radiograph from earlier today. 01/10/2016 CT abdomen/pelvis. FINDINGS: Lower chest: No acute abnormality at the lung bases. Nonspecific stable mild patchy subpleural reticulation throughout both lung bases. Coronary atherosclerosis. Hepatobiliary: Normal liver size. No liver mass. Cholecystectomy. Bile ducts are stable and within normal post cholecystectomy limits. Pancreas: Normal, with no mass or duct dilation. Spleen: Normal size. No mass. Adrenals/Urinary Tract: Normal adrenals. No hydronephrosis. Stable mild scarring  in the upper right kidney. No renal mass. 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 No left hydronephrosis. Mild fullness of the right renal collecting system without overt right hydronephrosis. Normal bladder. Stable curvilinear hyperdensity surrounding the urethra, presumably iatrogenic (periurethral collagen injection or mesh). Stomach/Bowel: Small hiatal hernia. Otherwise normal nondistended stomach. Normal caliber small bowel with no small bowel wall thickening. Appendectomy. Mild sigmoid diverticulosis, with no large bowel wall thickening or pericolonic fat stranding. Fatty stools. Vascular/Lymphatic: Atherosclerotic nonaneurysmal abdominal aorta. Patent portal, splenic, hepatic and renal veins. IVC filter in place below  the level of the renal veins. No pathologically enlarged lymph nodes in the abdomen or pelvis. Reproductive: Grossly normal uterus.  No adnexal mass. Other: No pneumoperitoneum, ascites or focal fluid collection. Musculoskeletal: No aggressive appearing focal osseous lesions. Stable post vertebroplasty change in T12 level with stable severe T12 vertebral compression fracture. Severe multilevel thoracolumbar degenerative disc disease. IMPRESSION: 1. No acute abnormality. No evidence of bowel obstruction or acute bowel inflammation. Mild sigmoid diverticulosis, no evidence of acute diverticulitis. 2. Stable periurethral iatrogenic change. No overt hydronephrosis. Redemonstration of partial duplication of the right renal collecting system. 3. Stable chronic severe T12 vertebral compression fracture with associated stable post vertebroplasty change. 4.  Aortic Atherosclerosis (ICD10-I70.0).  Coronary atherosclerosis. 5. Small hiatal hernia. Electronically Signed   By: Ilona Sorrel M.D.   On: 03/03/2018 20:10    Assessment/Plan  Orthostasis  Continue PT/OT  Continue exercises as taught by PT/OT  Ambulate with assistance and walker  Monitor vitals frequently  Continue midodrine 10 mg po TID  Peripheral edema  Continue daily TED Hose, on in the early am, off in the pm  Elevate legs when at rest  Lasix 40 mg po Q Day  Labs next week  Family/ staff Communication:   Total Time:  Documentation:  Face to Face:  Family/Phone:   Labs/tests ordered:  Cbc, met c, mag+, tsh, b12, d  Medication list reviewed and assessed for continued appropriateness. Monthly medication orders reviewed and signed.  Vikki Ports, NP-C [facility]iatric[facility] [phone], [place] [address] Cell Phone (Mon-Fri 8am-5pm):  [phone] On Call:  [phone] & follow prompts after 5pm & weekends Office Phone:  [phone] Office Fax:  [phone]

## 2018-03-15 ENCOUNTER — Other Ambulatory Visit
Admission: RE | Admit: 2018-03-15 | Discharge: 2018-03-15 | Disposition: A | Discharge status: Home / Self Care | Payer: Medicare Other | Source: Ambulatory Visit | Attending: Gerontology | Admitting: Gerontology

## 2018-03-15 DIAGNOSIS — I951 Orthostatic hypotension: Secondary | ICD-10-CM | POA: Insufficient documentation

## 2018-03-15 LAB — CBC WITH DIFFERENTIAL/PLATELET
Basophils Absolute: 0.1 10*3/uL (ref 0–0.1)
Basophils Relative: 1 %
Eosinophils Absolute: 0.2 10*3/uL (ref 0–0.7)
Eosinophils Relative: 3 %
HCT: 32.2 % — ABNORMAL LOW (ref 35.0–47.0)
Hemoglobin: 10.8 g/dL — ABNORMAL LOW (ref 12.0–16.0)
Lymphocytes Relative: 15 %
Lymphs Abs: 1.2 10*3/uL (ref 1.0–3.6)
MCH: 31.9 pg (ref 26.0–34.0)
MCHC: 33.6 g/dL (ref 32.0–36.0)
MCV: 95 fL (ref 80.0–100.0)
Monocytes Absolute: 0.9 10*3/uL (ref 0.2–0.9)
Monocytes Relative: 11 %
Neutro Abs: 5.8 10*3/uL (ref 1.4–6.5)
Neutrophils Relative %: 70 %
Platelets: 225 10*3/uL (ref 150–440)
RBC: 3.39 MIL/uL — ABNORMAL LOW (ref 3.80–5.20)
RDW: 13.8 % (ref 11.5–14.5)
Smear Review: ADEQUATE
WBC: 8.2 10*3/uL (ref 3.6–11.0)

## 2018-03-15 LAB — COMPREHENSIVE METABOLIC PANEL
ALT: 29 U/L (ref 14–54)
AST: 29 U/L (ref 15–41)
Albumin: 2.5 g/dL — ABNORMAL LOW (ref 3.5–5.0)
Alkaline Phosphatase: 78 U/L (ref 38–126)
Anion gap: 8 (ref 5–15)
BUN: 14 mg/dL (ref 6–20)
CO2: 27 mmol/L (ref 22–32)
Calcium: 8.4 mg/dL — ABNORMAL LOW (ref 8.9–10.3)
Chloride: 102 mmol/L (ref 101–111)
Creatinine, Ser: 1.03 mg/dL — ABNORMAL HIGH (ref 0.44–1.00)
GFR calc Af Amer: 54 mL/min — ABNORMAL LOW (ref >60)
GFR calc non Af Amer: 47 mL/min — ABNORMAL LOW (ref >60)
Glucose, Bld: 105 mg/dL — ABNORMAL HIGH (ref 65–99)
Potassium: 4.5 mmol/L (ref 3.5–5.1)
Sodium: 137 mmol/L (ref 135–145)
Total Bilirubin: 0.4 mg/dL (ref 0.3–1.2)
Total Protein: 6.1 g/dL — ABNORMAL LOW (ref 6.5–8.1)

## 2018-03-15 LAB — LIPID PANEL
Cholesterol: 144 mg/dL (ref 0–200)
HDL: 41 mg/dL (ref >40)
LDL Cholesterol: 86 mg/dL (ref 0–99)
Total CHOL/HDL Ratio: 3.5 RATIO
Triglycerides: 83 mg/dL (ref <150)
VLDL: 17 mg/dL (ref 0–40)

## 2018-03-15 LAB — TSH: TSH: 3.333 u[IU]/mL (ref 0.350–4.500)

## 2018-03-15 LAB — MAGNESIUM: Magnesium: 1.8 mg/dL (ref 1.7–2.4)

## 2018-03-16 LAB — VITAMIN B12: Vitamin B-12: 716 pg/mL (ref 180–914)

## 2018-03-16 LAB — VITAMIN D 25 HYDROXY (VIT D DEFICIENCY, FRACTURES): Vit D, 25-Hydroxy: 48.1 ng/mL (ref 30.0–100.0)

## 2018-03-22 ENCOUNTER — Non-Acute Institutional Stay (SKILLED_NURSING_FACILITY): Payer: Medicare Other | Admitting: Gerontology

## 2018-03-22 DIAGNOSIS — G500 Trigeminal neuralgia: Secondary | ICD-10-CM | POA: Diagnosis not present

## 2018-03-22 DIAGNOSIS — R609 Edema, unspecified: Secondary | ICD-10-CM

## 2018-03-22 DIAGNOSIS — I951 Orthostatic hypotension: Secondary | ICD-10-CM

## 2018-03-22 DIAGNOSIS — E46 Unspecified protein-calorie malnutrition: Secondary | ICD-10-CM

## 2018-03-22 DIAGNOSIS — E440 Moderate protein-calorie malnutrition: Secondary | ICD-10-CM | POA: Insufficient documentation

## 2018-03-22 NOTE — Telephone Encounter (Signed)
[name], pt.'s daughter called to update pt.'s condition. States she is doing better. Currently at [facility], but will be transferring to assisted living at [facility] next Tuesday. States she is ambulating 50 feet now, which is a big improvement.States they decreased her Tegretol to twice a day.She will keep [place] updated.

## 2018-03-22 NOTE — Progress Notes (Signed)
Location:      Place of Service:  SNF (31) Provider:  Toni Arthurs, NP-C  McLean-Scocuzza, Nino Glow, MD  Patient Care Team: McLean-Scocuzza, Nino Glow, MD as PCP - General (Internal Medicine)  Extended Emergency Contact Information Primary Emergency Contact: Puryear,[name] D Address: [address]          [place], [place] [address] [place] Phone: [phone] Relation: Son Secondary Emergency Contact: Cornelia Copa [place] Phone: [phone] Relation: Daughter  Code Status:  FULL Goals of care: Advanced Directive information Advanced Directives 03/14/2018  Does Patient Have a Medical Advance Directive? No  Type of Advance Directive -  Does patient want to make changes to medical advance directive? No - Patient declined  Copy of [place] in Chart? -  Would patient like information on creating a medical advance directive? -     Chief Complaint  Patient presents with  . Medical Management of Chronic Issues    HPI:  Pt is a 84 y.o. female seen today for medical management of chronic diseases. Pt admitted to the facility for rehab following hospitalization for orthostasis, edema. Pt has been participating in PT/OT for weakness. Pt is progressing well. Jessica Erickson does continue to c/o intermittent, but frequent Trigeminal Neuralgia pain. Staff giving her tylenol for this with some relief. Will try gabapentin for added relief. Pt also has persistent BLE edema, 2+. TEDs in place, legs elevated. No c/o pain, cramping, etc. Appetite is diminished but no N/V, voiding well, having regular BMs now. Was constipated several days ago. Otherwise, pt is without complaints. VSS.      Past Medical History:  Diagnosis Date  . Acoustic neuroma ([place])   . Allergy   . Asthma   . Bilateral swelling of feet    and legs  . Bladder infection   . CAD (coronary artery disease)   . Cataract   . Change in voice   . Compression fracture of body  of thoracic vertebra (HCC)    T12 09/18/15 MRI s/p fall   . Constipation   . COPD (chronic obstructive pulmonary disease) ([place])    previous CXR with chronic interstitial lung dz   . CVA (cerebral vascular accident) ([place])   . Depression   . Diabetes ([place])    with neuropathy  . Diabetes mellitus, type 2 ([place])   . Diarrhea   . Double vision   . DVT (deep venous thrombosis) ([place])    right leg 10/2015 was on coumadin off as of 2017/2018 ; s/p IVC filter  . Enuresis   . Eye pain, right   . Fall   . Fatty liver    09/15/15 also mildly dilated pancreatitic duct rec MRCP small sub cm cyst hemangioma speeln mild right hydronephrorossi and prox. hydroureter, kidney stones, mild scarring kidneys  . Female stress incontinence   . Flank pain   . GERD (gastroesophageal reflux disease)    with small hiatal hernia   . Hard of hearing   . Heart disease   . History of kidney problems   . Hyperlipidemia    mixed  . Hypertension   . Hypothyroidism, postsurgical   . Impaired mobility and ADLs    uses rolling walker has caretaker 24/7 at home  . Leg edema   . Mixed incontinence urge and stress (female)(female)   . Neuropathy   . Osteoarthritis    DDD spine   . Osteoporosis with fracture    T12 compression fracture  .  Photophobia   . Pulmonary embolism ([place])    10/2015 off coumadin as of 04/2016  . Pulmonary HTN (HCC)    mild pulm HTN, echo 10/09/15 EF 44-01%UUVOZ 1 dd, RV systolic pressure increased   . Recurrent UTI   . Sinus pressure   . Skin cancer    BCC jawline and scalp   . Thyroid disease    follows [facility]  . TIA (transient ischemic attack)    MRI 2009/2010 neg stroke   . Trigeminal neuralgia    Dr. Tomi Bamberger s/p gamma knife x 2, on Tegretol since 2011/2012 no increase in dose >200 mg bid rec per family per neurology   . Urinary frequency   . Urinary, incontinence, stress female    Dr Erlene Quan urology    Past Surgical History:  Procedure Laterality Date  . APPENDECTOMY      as a child, open  . BRAIN SURGERY     schwnnoma removal 1996   . brain tumor surgery    . BREAST SURGERY     breast bx  . CATARACT EXTRACTION    . CHOLECYSTECTOMY    . EYE SURGERY     cataract  . IVC FILTER PLACEMENT ([facility] HX)     Dr. Lucky Cowboy 10/2015   . LAPAROSCOPIC TUBAL LIGATION    . MOHS SURGERY     scalp 04/2014   . PERIPHERAL VASCULAR CATHETERIZATION N/A 10/11/2015   Procedure: IVC Filter Insertion;  Surgeon: Algernon Huxley, MD;  Location: [facility];  Service: Cardiovascular;  Laterality: N/A;  . PUBOVAGINAL SLING    . THROAT SURGERY    . THYROID SURGERY     tumor around vocal cords   . TOOTH EXTRACTION     winter 2018   . TOTAL THYROIDECTOMY  1976    Allergies  Allergen Reactions  . Penicillins Shortness Of Breath, Rash and Other (See Comments)    Has patient had a PCN reaction causing immediate rash, facial/tongue/throat swelling, SOB or lightheadedness with hypotension: Yes Has patient had a PCN reaction causing severe rash involving mucus membranes or skin necrosis: No Has patient had a PCN reaction that required hospitalization No Has patient had a PCN reaction occurring within the last 10 years: No If all of the above answers are "NO", then may proceed with Cephalosporin use.  . Sulfa Antibiotics Shortness Of Breath, Rash and Other (See Comments)  . Amitiza [Lubiprostone]     N/v/d  . Aspirin Other (See Comments)    Reaction:  Unknown  Other reaction(s): Bleeding (intolerance) Per patient " causes nose to bleed" Can take 81 mg daily without any complications Other reaction(s): "bloody nose"   . Penicillin G Other (See Comments)    Allergies as of 03/22/2018      Reactions   Penicillins Shortness Of Breath, Rash, Other (See Comments)   Has patient had a PCN reaction causing immediate rash, facial/tongue/throat swelling, SOB or lightheadedness with hypotension: Yes Has patient had a PCN reaction causing severe rash involving mucus membranes or skin  necrosis: No Has patient had a PCN reaction that required hospitalization No Has patient had a PCN reaction occurring within the last 10 years: No If all of the above answers are "NO", then may proceed with Cephalosporin use.   Sulfa Antibiotics Shortness Of Breath, Rash, Other (See Comments)   Amitiza [lubiprostone]    N/v/d   Aspirin Other (See Comments)   Reaction:  Unknown  Other reaction(s): Bleeding (intolerance) Per patient "  causes nose to bleed" Can take 81 mg daily without any complications Other reaction(s): "bloody nose"   Penicillin G Other (See Comments)      Medication List        Accurate as of 03/22/18 11:17 AM. Always use your most recent med list.          acetaminophen 325 MG tablet Commonly known as:  TYLENOL Take 650 mg by mouth every 6 (six) hours as needed.   aspirin EC 81 MG tablet Take 81 mg by mouth daily.   atorvastatin 40 MG tablet Commonly known as:  LIPITOR Take 40 mg by mouth daily at 6 PM.   bisacodyl 5 MG EC tablet Commonly known as:  DULCOLAX Take 1 tablet (5 mg total) by mouth daily as needed for moderate constipation.   budesonide-formoterol 160-4.5 MCG/ACT inhaler Commonly known as:  SYMBICORT Inhale 2 puffs into the lungs 2 (two) times daily.   CALCIUM 600+D 600-400 MG-UNIT tablet Generic drug:  Calcium Carbonate-Vitamin D Take 1 tablet by mouth daily.   carbamazepine 200 MG tablet Commonly known as:  TEGRETOL Take 1 tablet (200 mg total) by mouth 2 (two) times daily.   docusate sodium 100 MG capsule Commonly known as:  COLACE Take 1 capsule (100 mg total) by mouth 2 (two) times daily.   ENSURE ENLIVE PO Take 1 Bottle by mouth 2 (two) times daily.   feeding supplement (PRO-STAT SUGAR FREE 64) Liqd Take 30 mLs by mouth 2 (two) times daily between meals.   fluticasone 50 MCG/ACT nasal spray Commonly known as:  FLONASE Place 1 spray into both nostrils daily.   furosemide 20 MG tablet Commonly known as:  LASIX Take  20 mg by mouth daily.   guaiFENesin 600 MG 12 hr tablet Commonly known as:  MUCINEX Take 600 mg by mouth daily.   HYDROcodone-acetaminophen 5-325 MG tablet Commonly known as:  NORCO/VICODIN Take 1 tablet by mouth every 6 (six) hours as needed for moderate pain or severe pain.   insulin regular 100 units/mL injection Commonly known as:  NOVOLIN R,HUMULIN R Inject 0-12 Units into the skin 3 (three) times daily before meals. 100 unit/mL; amt: Per Sliding Scale; If Blood Sugar is 176 to 250, give 3 Units.If Blood Sugar is 251 to 325, give 6 Units.If Blood Sugar is 326 to 450, give 10 Units. If Blood Sugar is greater than 450, give 12 Units and recheck in 1 hour. Notify MD/NP if >450 after recheck. Check blood sugar AC & HS   levothyroxine 200 MCG tablet Commonly known as:  SYNTHROID, LEVOTHROID Take 200 mcg by mouth daily before breakfast. Except on Sunday. Do not take with other medications   magnesium hydroxide 400 MG/5ML suspension Commonly known as:  MILK OF MAGNESIA Take 30 mLs by mouth every 4 (four) hours as needed. Constipation/ no BM for 2 days   metFORMIN 500 MG 24 hr tablet Commonly known as:  GLUCOPHAGE-XR Take 500 mg by mouth daily with breakfast.   midodrine 10 MG tablet Commonly known as:  PROAMATINE Take 1 tablet (10 mg total) by mouth 3 (three) times daily with meals.   multivitamin with minerals Tabs tablet Take 1 tablet by mouth daily.   pantoprazole 40 MG tablet Commonly known as:  PROTONIX Take 1 tablet (40 mg total) by mouth daily.   Polyethyl Glycol-Propyl Glycol 0.4-0.3 % Soln Place 1 drop into both eyes at bedtime.   polyethylene glycol powder powder Commonly known as:  GLYCOLAX/MIRALAX Take 17 g by  mouth daily as needed for mild constipation.   sucralfate 1 GM/10ML suspension Commonly known as:  CARAFATE Take 1 g by mouth 4 (four) times daily -  with meals and at bedtime.   tiotropium 18 MCG inhalation capsule Commonly known as:  SPIRIVA Place  1 capsule (18 mcg total) into inhaler and inhale daily.   traZODone 50 MG tablet Commonly known as:  DESYREL Take 0.5 tablets (25 mg total) by mouth at bedtime as needed for sleep.       Review of Systems  Constitutional: Negative for activity change, appetite change, chills, diaphoresis and fever.  HENT: Negative for congestion, mouth sores, nosebleeds, postnasal drip, sneezing, sore throat, trouble swallowing and voice change.   Respiratory: Negative for apnea, cough, choking, chest tightness, shortness of breath and wheezing.   Cardiovascular: Negative for chest pain, palpitations and leg swelling.  Gastrointestinal: Negative for abdominal distention, abdominal pain, constipation, diarrhea and nausea.  Genitourinary: Negative for difficulty urinating, dysuria, frequency and urgency.  Musculoskeletal: Positive for arthralgias (typical arthritis). Negative for back pain, gait problem and myalgias.  Skin: Negative for color change, pallor, rash and wound.  Neurological: Positive for weakness. Negative for dizziness, tremors, syncope, speech difficulty, numbness and headaches.  Psychiatric/Behavioral: Negative for agitation and behavioral problems.  All other systems reviewed and are negative.   Immunization History  Administered Date(s) Administered  . Influenza-Unspecified 08/05/2015, 09/02/2017   Pertinent  Health Maintenance Due  Topic Date Due  . FOOT EXAM  04/22/1936  . OPHTHALMOLOGY EXAM  04/22/1936  . DEXA SCAN  04/22/1993  . PNA vac Low Risk Adult (1 of 2 - PCV13) 04/22/1993  . HEMOGLOBIN A1C  06/23/2018  . INFLUENZA VACCINE  07/04/2018  . URINE MICROALBUMIN  12/24/2018   No flowsheet data found. Functional Status Survey:    Vitals:   03/21/18 2040  BP: 137/61  Pulse: 74  Resp: 20  Temp: 98 F (36.7 C)  SpO2: 98%   There is no height or weight on file to calculate BMI. Physical Exam  Constitutional: Jessica Erickson is oriented to person, place, and time. Vital signs  are normal. Jessica Erickson appears well-developed and well-nourished. Jessica Erickson is active and cooperative. Jessica Erickson does not appear ill. No distress.  HENT:  Head: Normocephalic and atraumatic.  Right Ear: Decreased hearing is noted.  Left Ear: Decreased hearing is noted.  Mouth/Throat: Uvula is midline, oropharynx is clear and moist and mucous membranes are normal. Mucous membranes are not pale, not dry and not cyanotic.  Right side hearing better than left  Eyes: Pupils are equal, round, and reactive to light. Conjunctivae, EOM and lids are normal.  Neck: Trachea normal, normal range of motion and full passive range of motion without pain. Neck supple. No JVD present. No tracheal deviation, no edema and no erythema present. No thyromegaly present.  Cardiovascular: Normal rate, regular rhythm, normal heart sounds, intact distal pulses and normal pulses. Exam reveals no gallop, no distant heart sounds and no friction rub.  No murmur heard. Pulses:      Dorsalis pedis pulses are 2+ on the right side, and 2+ on the left side.  BLE edema, TEDs in place  Pulmonary/Chest: Effort normal and breath sounds normal. No accessory muscle usage. No respiratory distress. Jessica Erickson has no decreased breath sounds. Jessica Erickson has no wheezes. Jessica Erickson has no rhonchi. Jessica Erickson has no rales. Jessica Erickson exhibits no tenderness.  Abdominal: Soft. Normal appearance and bowel sounds are normal. Jessica Erickson exhibits no distension and no ascites. There is no tenderness.  Musculoskeletal: Normal range of motion. Jessica Erickson exhibits no edema or tenderness.  Expected osteoarthritis, stiffness; Bilateral Calves soft, supple. Negative Homan's Sign. B- pedal pulses equal  Neurological: Jessica Erickson is alert and oriented to person, place, and time. Jessica Erickson has normal strength.  Recurrent Right Trigeminal Neuralgia  Skin: Skin is warm, dry and intact. Jessica Erickson is not diaphoretic. No cyanosis. No pallor. Nails show no clubbing.  Psychiatric: Jessica Erickson has a normal mood and affect. Her speech is normal and behavior is  normal. Judgment and thought content normal. Cognition and memory are normal.  Nursing note and vitals reviewed.   Labs reviewed: Recent Labs    03/05/18 2030 03/06/18 0358 03/15/18 0528  NA 137 138 137  K 4.3 4.3 4.5  CL 107 109 102  CO2 '23 23 27  ' GLUCOSE 175* 125* 105*  BUN 23* 20 14  CREATININE 1.14* 0.94 1.03*  CALCIUM 7.8* 8.0* 8.4*  MG  --   --  1.8   Recent Labs    12/24/17 0931 03/03/18 1630 03/15/18 0528  AST '17 26 29  ' ALT '12 19 29  ' ALKPHOS 52 65 78  BILITOT 0.5 0.4 0.4  PROT 6.3 7.1 6.1*  ALBUMIN 3.6 3.7 2.5*   Recent Labs    03/04/18 1955  03/05/18 2030 03/06/18 0358 03/15/18 0528  WBC 7.5   < > 8.5 7.6 8.2  NEUTROABS 5.5  --  6.6*  --  5.8  HGB 12.6   < > 13.3 11.8* 10.8*  HCT 38.7   < > 40.5 36.3 32.2*  MCV 95.0   < > 94.8 94.3 95.0  PLT 126*   < > 108* 101* 225   < > = values in this interval not displayed.   Lab Results  Component Value Date   TSH 3.333 03/15/2018   Lab Results  Component Value Date   HGBA1C 6.3 12/24/2017   Lab Results  Component Value Date   CHOL 144 03/15/2018   HDL 41 03/15/2018   LDLCALC 86 03/15/2018   TRIG 83 03/15/2018   CHOLHDL 3.5 03/15/2018    Significant Diagnostic Results in last 30 days:  Dg Pelvis 1-2 Views  Result Date: 03/03/2018 CLINICAL DATA:  Pelvic pain after riding bike. EXAM: PELVIS - 1-2 VIEW COMPARISON:  CT scan of January 10, 2016. FINDINGS: There is no evidence of pelvic fracture or diastasis. No pelvic bone lesions are seen. IMPRESSION: Normal pelvis. Electronically Signed   By: Marijo Conception, M.D.   On: 03/03/2018 17:27   Ct Head Wo Contrast  Result Date: 03/05/2018 CLINICAL DATA:  Weakness and orthostatic hypotension EXAM: CT HEAD WITHOUT CONTRAST TECHNIQUE: Contiguous axial images were obtained from the base of the skull through the vertex without intravenous contrast. COMPARISON:  08/28/2009 FINDINGS: Brain: No evidence of acute infarction, hemorrhage, hydrocephalus, extra-axial  collection or mass lesion/mass effect. Vascular: No hyperdense vessel or unexpected calcification. Skull: Postsurgical defect is noted in the left occipital region Sinuses/Orbits: Within normal limits. Other: None IMPRESSION: Postsurgical changes stable from the prior exam. No acute intracranial abnormality noted. Electronically Signed   By: Inez Catalina M.D.   On: 03/05/2018 20:58   Ct Abdomen Pelvis W Contrast  Result Date: 03/03/2018 CLINICAL DATA:  Pelvic pain.  No reported injury.  Unable to walk. EXAM: CT ABDOMEN AND PELVIS WITH CONTRAST TECHNIQUE: Multidetector CT imaging of the abdomen and pelvis was performed using the standard protocol following bolus administration of intravenous contrast. CONTRAST:  72m ISOVUE-300 IOPAMIDOL (ISOVUE-300) INJECTION 61% COMPARISON:  Pelvic radiograph from earlier today. 01/10/2016 CT abdomen/pelvis. FINDINGS: Lower chest: No acute abnormality at the lung bases. Nonspecific stable mild patchy subpleural reticulation throughout both lung bases. Coronary atherosclerosis. Hepatobiliary: Normal liver size. No liver mass. Cholecystectomy. Bile ducts are stable and within normal post cholecystectomy limits. Pancreas: Normal, with no mass or duct dilation. Spleen: Normal size. No mass. Adrenals/Urinary Tract: Normal adrenals. No hydronephrosis. Stable mild scarring in the upper right kidney. No renal mass. 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 No left hydronephrosis. Mild fullness of the right renal collecting system without overt right hydronephrosis. Normal bladder. Stable curvilinear hyperdensity surrounding the urethra, presumably iatrogenic (periurethral collagen injection or mesh). Stomach/Bowel: Small hiatal hernia. Otherwise normal nondistended stomach. Normal caliber small bowel with no small bowel wall thickening. Appendectomy. Mild sigmoid diverticulosis, with no large bowel wall thickening or pericolonic fat  stranding. Fatty stools. Vascular/Lymphatic: Atherosclerotic nonaneurysmal abdominal aorta. Patent portal, splenic, hepatic and renal veins. IVC filter in place below the level of the renal veins. No pathologically enlarged lymph nodes in the abdomen or pelvis. Reproductive: Grossly normal uterus.  No adnexal mass. Other: No pneumoperitoneum, ascites or focal fluid collection. Musculoskeletal: No aggressive appearing focal osseous lesions. Stable post vertebroplasty change in T12 level with stable severe T12 vertebral compression fracture. Severe multilevel thoracolumbar degenerative disc disease. IMPRESSION: 1. No acute abnormality. No evidence of bowel obstruction or acute bowel inflammation. Mild sigmoid diverticulosis, no evidence of acute diverticulitis. 2. Stable periurethral iatrogenic change. No overt hydronephrosis. Redemonstration of partial duplication of the right renal collecting system. 3. Stable chronic severe T12 vertebral compression fracture with associated stable post vertebroplasty change. 4.  Aortic Atherosclerosis (ICD10-I70.0).  Coronary atherosclerosis. 5. Small hiatal hernia. Electronically Signed   By: Ilona Sorrel M.D.   On: 03/03/2018 20:10    Assessment/Plan Jessica Erickson was seen today for medical management of chronic issues.  Diagnoses and all orders for this visit:  Orthostasis  Peripheral edema  Protein-calorie malnutrition, unspecified severity (HCC)  Trigeminal neuralgia   Continue TED Hose  Elevated legs when at rest  Increase Lasix to 30 mg po Q Day  Met B one week to check K+  Continue Pro-stat 30 mL PO BID  Continue Ensure Enlive 1 bottle po BID  Add Neurontin 100 mg po BID for persistent Trigeminal Neuralgia pain  Family/ staff Communication:   Total Time:  Documentation:  Face to Face:  Family/Phone:   Labs/tests ordered:  Recent labs stable, met B 1 week  Medication list reviewed and assessed for continued appropriateness. Monthly  medication orders reviewed and signed.  Vikki Ports, NP-C [facility] [phone], [place] [address] Cell Phone (Mon-Fri 8am-5pm):  [phone] On Call:  [phone] & follow prompts after 5pm & weekends Office Phone:  [phone] Office Fax:  [phone]

## 2018-03-25 NOTE — Telephone Encounter (Signed)
Thank you :)

## 2018-03-27 ENCOUNTER — Telehealth: Payer: Self-pay | Admitting: Internal Medicine

## 2018-03-27 ENCOUNTER — Inpatient Hospital Stay
Admission: EM | Admit: 2018-03-27 | Discharge: 2018-04-01 | DRG: 272 | Disposition: A | Discharge status: Home Health | Payer: Medicare Other | Attending: Internal Medicine | Admitting: Internal Medicine

## 2018-03-27 ENCOUNTER — Ambulatory Visit
Admission: RE | Admit: 2018-03-27 | Discharge: 2018-03-27 | Disposition: A | Discharge status: Home / Self Care | Payer: Medicare Other | Source: Ambulatory Visit | Attending: Internal Medicine | Admitting: Internal Medicine

## 2018-03-27 ENCOUNTER — Encounter: Payer: Self-pay | Admitting: Internal Medicine

## 2018-03-27 ENCOUNTER — Other Ambulatory Visit: Payer: Self-pay

## 2018-03-27 ENCOUNTER — Ambulatory Visit (INDEPENDENT_AMBULATORY_CARE_PROVIDER_SITE_OTHER): Payer: Medicare Other | Admitting: Internal Medicine

## 2018-03-27 ENCOUNTER — Telehealth: Payer: Self-pay | Admitting: *Deleted

## 2018-03-27 VITALS — BP 122/86 | HR 86 | Temp 98.2°F | Ht 67.0 in | Wt 218.8 lb

## 2018-03-27 DIAGNOSIS — I272 Pulmonary hypertension, unspecified: Secondary | ICD-10-CM | POA: Diagnosis present

## 2018-03-27 DIAGNOSIS — E440 Moderate protein-calorie malnutrition: Secondary | ICD-10-CM

## 2018-03-27 DIAGNOSIS — H9190 Unspecified hearing loss, unspecified ear: Secondary | ICD-10-CM | POA: Diagnosis present

## 2018-03-27 DIAGNOSIS — Z886 Allergy status to analgesic agent status: Secondary | ICD-10-CM

## 2018-03-27 DIAGNOSIS — J449 Chronic obstructive pulmonary disease, unspecified: Secondary | ICD-10-CM | POA: Diagnosis present

## 2018-03-27 DIAGNOSIS — M79601 Pain in right arm: Secondary | ICD-10-CM | POA: Diagnosis not present

## 2018-03-27 DIAGNOSIS — K219 Gastro-esophageal reflux disease without esophagitis: Secondary | ICD-10-CM | POA: Diagnosis present

## 2018-03-27 DIAGNOSIS — Z86711 Personal history of pulmonary embolism: Secondary | ICD-10-CM | POA: Diagnosis not present

## 2018-03-27 DIAGNOSIS — I82413 Acute embolism and thrombosis of femoral vein, bilateral: Secondary | ICD-10-CM | POA: Diagnosis present

## 2018-03-27 DIAGNOSIS — M810 Age-related osteoporosis without current pathological fracture: Secondary | ICD-10-CM | POA: Diagnosis present

## 2018-03-27 DIAGNOSIS — I82433 Acute embolism and thrombosis of popliteal vein, bilateral: Secondary | ICD-10-CM | POA: Diagnosis present

## 2018-03-27 DIAGNOSIS — S5001XA Contusion of right elbow, initial encounter: Secondary | ICD-10-CM | POA: Diagnosis not present

## 2018-03-27 DIAGNOSIS — E119 Type 2 diabetes mellitus without complications: Secondary | ICD-10-CM | POA: Diagnosis not present

## 2018-03-27 DIAGNOSIS — Z86718 Personal history of other venous thrombosis and embolism: Secondary | ICD-10-CM | POA: Diagnosis not present

## 2018-03-27 DIAGNOSIS — M79604 Pain in right leg: Secondary | ICD-10-CM | POA: Diagnosis not present

## 2018-03-27 DIAGNOSIS — Z8673 Personal history of transient ischemic attack (TIA), and cerebral infarction without residual deficits: Secondary | ICD-10-CM | POA: Diagnosis not present

## 2018-03-27 DIAGNOSIS — I2510 Atherosclerotic heart disease of native coronary artery without angina pectoris: Secondary | ICD-10-CM | POA: Diagnosis present

## 2018-03-27 DIAGNOSIS — Z87891 Personal history of nicotine dependence: Secondary | ICD-10-CM

## 2018-03-27 DIAGNOSIS — I10 Essential (primary) hypertension: Secondary | ICD-10-CM

## 2018-03-27 DIAGNOSIS — E1140 Type 2 diabetes mellitus with diabetic neuropathy, unspecified: Secondary | ICD-10-CM | POA: Diagnosis present

## 2018-03-27 DIAGNOSIS — Z87310 Personal history of (healed) osteoporosis fracture: Secondary | ICD-10-CM

## 2018-03-27 DIAGNOSIS — K5900 Constipation, unspecified: Secondary | ICD-10-CM | POA: Diagnosis present

## 2018-03-27 DIAGNOSIS — I824Y3 Acute embolism and thrombosis of unspecified deep veins of proximal lower extremity, bilateral: Secondary | ICD-10-CM | POA: Diagnosis present

## 2018-03-27 DIAGNOSIS — G500 Trigeminal neuralgia: Secondary | ICD-10-CM | POA: Diagnosis not present

## 2018-03-27 DIAGNOSIS — E890 Postprocedural hypothyroidism: Secondary | ICD-10-CM | POA: Diagnosis present

## 2018-03-27 DIAGNOSIS — I82423 Acute embolism and thrombosis of iliac vein, bilateral: Principal | ICD-10-CM | POA: Diagnosis present

## 2018-03-27 DIAGNOSIS — K760 Fatty (change of) liver, not elsewhere classified: Secondary | ICD-10-CM | POA: Diagnosis present

## 2018-03-27 DIAGNOSIS — I82409 Acute embolism and thrombosis of unspecified deep veins of unspecified lower extremity: Secondary | ICD-10-CM | POA: Diagnosis not present

## 2018-03-27 DIAGNOSIS — Z7951 Long term (current) use of inhaled steroids: Secondary | ICD-10-CM

## 2018-03-27 DIAGNOSIS — Y92230 Patient room in hospital as the place of occurrence of the external cause: Secondary | ICD-10-CM | POA: Diagnosis not present

## 2018-03-27 DIAGNOSIS — Z79890 Hormone replacement therapy (postmenopausal): Secondary | ICD-10-CM

## 2018-03-27 DIAGNOSIS — Z85828 Personal history of other malignant neoplasm of skin: Secondary | ICD-10-CM

## 2018-03-27 DIAGNOSIS — Z79899 Other long term (current) drug therapy: Secondary | ICD-10-CM

## 2018-03-27 DIAGNOSIS — Z833 Family history of diabetes mellitus: Secondary | ICD-10-CM

## 2018-03-27 DIAGNOSIS — Z7982 Long term (current) use of aspirin: Secondary | ICD-10-CM

## 2018-03-27 DIAGNOSIS — Z9049 Acquired absence of other specified parts of digestive tract: Secondary | ICD-10-CM

## 2018-03-27 DIAGNOSIS — Z882 Allergy status to sulfonamides status: Secondary | ICD-10-CM

## 2018-03-27 DIAGNOSIS — G4700 Insomnia, unspecified: Secondary | ICD-10-CM | POA: Diagnosis present

## 2018-03-27 DIAGNOSIS — E782 Mixed hyperlipidemia: Secondary | ICD-10-CM | POA: Diagnosis present

## 2018-03-27 DIAGNOSIS — M79605 Pain in left leg: Secondary | ICD-10-CM | POA: Diagnosis not present

## 2018-03-27 DIAGNOSIS — Z888 Allergy status to other drugs, medicaments and biological substances status: Secondary | ICD-10-CM

## 2018-03-27 DIAGNOSIS — I82220 Acute embolism and thrombosis of inferior vena cava: Secondary | ICD-10-CM | POA: Diagnosis present

## 2018-03-27 DIAGNOSIS — Z9849 Cataract extraction status, unspecified eye: Secondary | ICD-10-CM

## 2018-03-27 DIAGNOSIS — I82401 Acute embolism and thrombosis of unspecified deep veins of right lower extremity: Secondary | ICD-10-CM | POA: Diagnosis not present

## 2018-03-27 DIAGNOSIS — R600 Localized edema: Secondary | ICD-10-CM | POA: Diagnosis not present

## 2018-03-27 DIAGNOSIS — Z8249 Family history of ischemic heart disease and other diseases of the circulatory system: Secondary | ICD-10-CM

## 2018-03-27 DIAGNOSIS — Z803 Family history of malignant neoplasm of breast: Secondary | ICD-10-CM

## 2018-03-27 DIAGNOSIS — F329 Major depressive disorder, single episode, unspecified: Secondary | ICD-10-CM | POA: Diagnosis present

## 2018-03-27 DIAGNOSIS — F419 Anxiety disorder, unspecified: Secondary | ICD-10-CM | POA: Diagnosis present

## 2018-03-27 DIAGNOSIS — I82402 Acute embolism and thrombosis of unspecified deep veins of left lower extremity: Secondary | ICD-10-CM | POA: Diagnosis not present

## 2018-03-27 DIAGNOSIS — Z794 Long term (current) use of insulin: Secondary | ICD-10-CM

## 2018-03-27 DIAGNOSIS — Z880 Allergy status to penicillin: Secondary | ICD-10-CM | POA: Diagnosis not present

## 2018-03-27 LAB — COMPREHENSIVE METABOLIC PANEL
ALT: 13 U/L — ABNORMAL LOW (ref 14–54)
AST: 25 U/L (ref 15–41)
Albumin: 3.4 g/dL — ABNORMAL LOW (ref 3.5–5.0)
Alkaline Phosphatase: 81 U/L (ref 38–126)
Anion gap: 7 (ref 5–15)
BUN: 20 mg/dL (ref 6–20)
CO2: 32 mmol/L (ref 22–32)
Calcium: 8.5 mg/dL — ABNORMAL LOW (ref 8.9–10.3)
Chloride: 100 mmol/L — ABNORMAL LOW (ref 101–111)
Creatinine, Ser: 1.15 mg/dL — ABNORMAL HIGH (ref 0.44–1.00)
GFR calc Af Amer: 47 mL/min — ABNORMAL LOW (ref >60)
GFR calc non Af Amer: 41 mL/min — ABNORMAL LOW (ref >60)
Glucose, Bld: 113 mg/dL — ABNORMAL HIGH (ref 65–99)
Potassium: 4 mmol/L (ref 3.5–5.1)
Sodium: 139 mmol/L (ref 135–145)
Total Bilirubin: 0.3 mg/dL (ref 0.3–1.2)
Total Protein: 6.9 g/dL (ref 6.5–8.1)

## 2018-03-27 LAB — PROTIME-INR
INR: 1.02
Prothrombin Time: 13.3 seconds (ref 11.4–15.2)

## 2018-03-27 LAB — GLUCOSE, CAPILLARY: Glucose-Capillary: 91 mg/dL (ref 65–99)

## 2018-03-27 LAB — CBC
HCT: 35.8 % (ref 35.0–47.0)
Hemoglobin: 12.3 g/dL (ref 12.0–16.0)
MCH: 32.5 pg (ref 26.0–34.0)
MCHC: 34.5 g/dL (ref 32.0–36.0)
MCV: 94.2 fL (ref 80.0–100.0)
Platelets: 196 10*3/uL (ref 150–440)
RBC: 3.8 MIL/uL (ref 3.80–5.20)
RDW: 15.1 % — ABNORMAL HIGH (ref 11.5–14.5)
WBC: 8.2 10*3/uL (ref 3.6–11.0)

## 2018-03-27 LAB — TROPONIN I: Troponin I: <0.03 ng/mL (ref <0.03)

## 2018-03-27 LAB — APTT: aPTT: 27 seconds (ref 24–36)

## 2018-03-27 LAB — BRAIN NATRIURETIC PEPTIDE: B Natriuretic Peptide: 49 pg/mL (ref 0.0–100.0)

## 2018-03-27 MED ORDER — DOCUSATE SODIUM 100 MG PO CAPS
100.0000 mg | ORAL_CAPSULE | Freq: Two times a day (BID) | ORAL | Status: DC
  Filled 2018-03-27: qty 1

## 2018-03-27 MED ORDER — TIOTROPIUM BROMIDE MONOHYDRATE 18 MCG IN CAPS
18.0000 ug | ORAL_CAPSULE | Freq: Every day | RESPIRATORY_TRACT | Status: DC
  Administered 2018-03-29 – 2018-04-01 (×4): 18 ug via RESPIRATORY_TRACT
  Filled 2018-03-27 (×2): qty 5

## 2018-03-27 MED ORDER — GABAPENTIN 100 MG PO CAPS
100.0000 mg | ORAL_CAPSULE | Freq: Two times a day (BID) | ORAL | Status: DC
  Administered 2018-03-27 – 2018-04-01 (×9): 100 mg via ORAL
  Filled 2018-03-27 (×9): qty 1

## 2018-03-27 MED ORDER — POLYETHYLENE GLYCOL 3350 17 GM/SCOOP PO POWD
17.0000 g | Freq: Every day | ORAL | Status: DC | PRN
  Filled 2018-03-27: qty 255

## 2018-03-27 MED ORDER — PRO-STAT SUGAR FREE PO LIQD
30.0000 mL | Freq: Two times a day (BID) | ORAL | Status: DC
  Administered 2018-03-30 – 2018-04-01 (×7): 30 mL via ORAL

## 2018-03-27 MED ORDER — DOCUSATE SODIUM 100 MG PO CAPS
100.0000 mg | ORAL_CAPSULE | Freq: Two times a day (BID) | ORAL | Status: DC
  Administered 2018-03-27 – 2018-04-01 (×9): 100 mg via ORAL
  Filled 2018-03-27 (×8): qty 1

## 2018-03-27 MED ORDER — FLUTICASONE PROPIONATE 50 MCG/ACT NA SUSP
1.0000 | Freq: Every day | NASAL | Status: DC
  Administered 2018-03-29 – 2018-04-01 (×4): 1 via NASAL
  Filled 2018-03-27: qty 16

## 2018-03-27 MED ORDER — FUROSEMIDE 40 MG PO TABS
40.0000 mg | ORAL_TABLET | Freq: Every day | ORAL | Status: DC
  Administered 2018-03-28: 40 mg via ORAL
  Filled 2018-03-27: qty 1

## 2018-03-27 MED ORDER — SODIUM CHLORIDE 0.9 % IV SOLN: 250.0000 mL | INTRAVENOUS | Status: DC | PRN

## 2018-03-27 MED ORDER — CARBAMAZEPINE 200 MG PO TABS
200.0000 mg | ORAL_TABLET | Freq: Two times a day (BID) | ORAL | Status: DC
  Filled 2018-03-27: qty 1

## 2018-03-27 MED ORDER — ASPIRIN EC 81 MG PO TBEC
81.0000 mg | DELAYED_RELEASE_TABLET | Freq: Every day | ORAL | Status: DC
  Administered 2018-03-28 – 2018-04-01 (×4): 81 mg via ORAL
  Filled 2018-03-27 (×4): qty 1

## 2018-03-27 MED ORDER — POLYETHYLENE GLYCOL 3350 17 G PO PACK
17.0000 g | PACK | Freq: Every day | ORAL | Status: DC | PRN
  Administered 2018-03-30 – 2018-04-01 (×2): 17 g via ORAL
  Filled 2018-03-27 (×3): qty 1

## 2018-03-27 MED ORDER — SODIUM CHLORIDE 0.9% FLUSH: 3.0000 mL | INTRAVENOUS | Status: DC | PRN

## 2018-03-27 MED ORDER — ADULT MULTIVITAMIN W/MINERALS CH
1.0000 | ORAL_TABLET | Freq: Every day | ORAL | Status: DC
  Administered 2018-03-28 – 2018-04-01 (×4): 1 via ORAL
  Filled 2018-03-27 (×4): qty 1

## 2018-03-27 MED ORDER — GABAPENTIN 100 MG PO CAPS
100.0000 mg | ORAL_CAPSULE | Freq: Two times a day (BID) | ORAL | Status: DC
  Filled 2018-03-27: qty 1

## 2018-03-27 MED ORDER — HEPARIN SODIUM (PORCINE) 5000 UNIT/ML IJ SOLN
4000.0000 [IU] | Freq: Once | INTRAMUSCULAR | Status: AC
  Administered 2018-03-27: 4000 [IU] via INTRAVENOUS

## 2018-03-27 MED ORDER — HEPARIN (PORCINE) IN NACL 100-0.45 UNIT/ML-% IJ SOLN: 10.0000 [IU]/kg/h | Freq: Once | INTRAMUSCULAR | Status: DC

## 2018-03-27 MED ORDER — GUAIFENESIN ER 600 MG PO TB12
600.0000 mg | ORAL_TABLET | Freq: Every day | ORAL | Status: DC
  Administered 2018-03-28 – 2018-04-01 (×4): 600 mg via ORAL
  Filled 2018-03-27 (×4): qty 1

## 2018-03-27 MED ORDER — CALCIUM CARBONATE-VITAMIN D 500-200 MG-UNIT PO TABS
1.0000 | ORAL_TABLET | Freq: Every day | ORAL | Status: DC
  Administered 2018-03-28 – 2018-04-01 (×4): 1 via ORAL
  Filled 2018-03-27 (×4): qty 1

## 2018-03-27 MED ORDER — FUROSEMIDE 40 MG PO TABS: 40.0000 mg | ORAL_TABLET | Freq: Every day | ORAL | 2 refills | Status: DC

## 2018-03-27 MED ORDER — PANTOPRAZOLE SODIUM 40 MG PO TBEC
40.0000 mg | DELAYED_RELEASE_TABLET | Freq: Every day | ORAL | Status: DC
Start: 2018-03-28 — End: 2018-04-01
  Administered 2018-03-28 – 2018-04-01 (×4): 40 mg via ORAL
  Filled 2018-03-27 (×4): qty 1

## 2018-03-27 MED ORDER — POLYVINYL ALCOHOL 1.4 % OP SOLN
1.0000 [drp] | Freq: Every day | OPHTHALMIC | Status: DC
  Administered 2018-03-27 – 2018-03-31 (×5): 1 [drp] via OPHTHALMIC
  Filled 2018-03-27: qty 15

## 2018-03-27 MED ORDER — METFORMIN HCL ER 500 MG PO TB24
500.0000 mg | ORAL_TABLET | Freq: Every day | ORAL | Status: DC
  Administered 2018-03-28: 500 mg via ORAL
  Filled 2018-03-27 (×2): qty 1

## 2018-03-27 MED ORDER — HEPARIN (PORCINE) IN NACL 100-0.45 UNIT/ML-% IJ SOLN
1400.0000 [IU]/h | INTRAMUSCULAR | Status: DC
Start: 2018-03-27 — End: 2018-03-28
  Administered 2018-03-27: 1400 [IU]/h via INTRAVENOUS
  Filled 2018-03-27 (×2): qty 250

## 2018-03-27 MED ORDER — MIDODRINE HCL 5 MG PO TABS
10.0000 mg | ORAL_TABLET | Freq: Three times a day (TID) | ORAL | Status: DC
  Administered 2018-03-28 – 2018-04-01 (×13): 10 mg via ORAL
  Filled 2018-03-27 (×16): qty 2

## 2018-03-27 MED ORDER — HYDROCODONE-ACETAMINOPHEN 5-325 MG PO TABS
1.0000 | ORAL_TABLET | Freq: Four times a day (QID) | ORAL | Status: DC | PRN
Start: 2018-03-27 — End: 2018-03-30
  Administered 2018-03-30: 1 via ORAL
  Filled 2018-03-27: qty 1

## 2018-03-27 MED ORDER — ACETAMINOPHEN 325 MG PO TABS
650.0000 mg | ORAL_TABLET | Freq: Four times a day (QID) | ORAL | Status: DC | PRN
  Administered 2018-03-29 – 2018-04-01 (×4): 650 mg via ORAL
  Filled 2018-03-27 (×4): qty 2

## 2018-03-27 MED ORDER — SODIUM CHLORIDE 0.9% FLUSH
3.0000 mL | Freq: Two times a day (BID) | INTRAVENOUS | Status: DC
  Administered 2018-03-27 – 2018-04-01 (×8): 3 mL via INTRAVENOUS

## 2018-03-27 MED ORDER — LEVOTHYROXINE SODIUM 100 MCG PO TABS
200.0000 ug | ORAL_TABLET | Freq: Every day | ORAL | Status: DC
  Administered 2018-03-28 – 2018-04-01 (×4): 200 ug via ORAL
  Filled 2018-03-27 (×4): qty 2

## 2018-03-27 MED ORDER — ONDANSETRON HCL 4 MG PO TABS: 4.0000 mg | ORAL_TABLET | Freq: Four times a day (QID) | ORAL | Status: DC | PRN

## 2018-03-27 MED ORDER — MOMETASONE FURO-FORMOTEROL FUM 200-5 MCG/ACT IN AERO
2.0000 | INHALATION_SPRAY | Freq: Two times a day (BID) | RESPIRATORY_TRACT | Status: DC
  Administered 2018-03-28 – 2018-04-01 (×8): 2 via RESPIRATORY_TRACT
  Filled 2018-03-27: qty 8.8

## 2018-03-27 MED ORDER — ONDANSETRON HCL 4 MG/2ML IJ SOLN: 4.0000 mg | Freq: Four times a day (QID) | INTRAMUSCULAR | Status: DC | PRN

## 2018-03-27 MED ORDER — CARBAMAZEPINE 200 MG PO TABS
200.0000 mg | ORAL_TABLET | Freq: Two times a day (BID) | ORAL | Status: DC
  Administered 2018-03-28 – 2018-04-01 (×9): 200 mg via ORAL
  Filled 2018-03-27 (×10): qty 1

## 2018-03-27 MED ORDER — INSULIN ASPART 100 UNIT/ML ~~LOC~~ SOLN: 0.0000 [IU] | Freq: Every day | SUBCUTANEOUS | Status: DC

## 2018-03-27 MED ORDER — INSULIN ASPART 100 UNIT/ML ~~LOC~~ SOLN
0.0000 [IU] | Freq: Three times a day (TID) | SUBCUTANEOUS | Status: DC
  Administered 2018-03-30 – 2018-04-01 (×4): 2 [IU] via SUBCUTANEOUS
  Filled 2018-03-27 (×4): qty 1

## 2018-03-27 MED ORDER — POLYETHYL GLYCOL-PROPYL GLYCOL 0.4-0.3 % OP SOLN: 1.0000 [drp] | Freq: Every day | OPHTHALMIC | Status: DC

## 2018-03-27 MED ORDER — TRAZODONE HCL 50 MG PO TABS
25.0000 mg | ORAL_TABLET | Freq: Every evening | ORAL | Status: DC | PRN
  Administered 2018-03-27 – 2018-03-31 (×5): 25 mg via ORAL
  Filled 2018-03-27 (×5): qty 1

## 2018-03-27 MED ORDER — ATORVASTATIN CALCIUM 20 MG PO TABS
40.0000 mg | ORAL_TABLET | Freq: Every day | ORAL | Status: DC
  Administered 2018-03-28 – 2018-04-01 (×5): 40 mg via ORAL
  Filled 2018-03-27 (×5): qty 2

## 2018-03-27 MED ORDER — ACETAMINOPHEN 650 MG RE SUPP: 650.0000 mg | Freq: Four times a day (QID) | RECTAL | Status: DC | PRN

## 2018-03-27 MED ORDER — BISACODYL 5 MG PO TBEC
5.0000 mg | DELAYED_RELEASE_TABLET | Freq: Every day | ORAL | Status: DC | PRN
  Administered 2018-03-29 – 2018-04-01 (×3): 5 mg via ORAL
  Filled 2018-03-27 (×3): qty 1

## 2018-03-27 NOTE — Patient Instructions (Addendum)
We are ordering echo of her heart and doppler ultrasound bilateral legs  Please increase lasix 40 mg daily in the am until your follow up  Please hold compression stockings for now  Please try Premier protein daily with meals Albumin was 2.5 03/15/18 Please stop Carafate discontinue      Edema Edema is an abnormal buildup of fluids in your bodytissues. Edema is somewhatdependent on gravity to pull the fluid to the lowest place in your body. That makes the condition more common in the legs and thighs (lower extremities). Painless swelling of the feet and ankles is common and becomes more likely as you get older. It is also common in looser tissues, like around your eyes. When the affected area is squeezed, the fluid may move out of that spot and leave a dent for a few moments. This dent is called pitting. What are the causes? There are many possible causes of edema. Eating too much salt and being on your feet or sitting for a long time can cause edema in your legs and ankles. Hot weather may make edema worse. Common medical causes of edema include:  Heart failure.  Liver disease.  Kidney disease.  Weak blood vessels in your legs.  Cancer.  An injury.  Pregnancy.  Some medications.  Obesity.  What are the signs or symptoms? Edema is usually painless.Your skin may look swollen or shiny. How is this diagnosed? Your health care provider may be able to diagnose edema by asking about your medical history and doing a physical exam. You may need to have tests such as X-rays, an electrocardiogram, or blood tests to check for medical conditions that may cause edema. How is this treated? Edema treatment depends on the cause. If you have heart, liver, or kidney disease, you need the treatment appropriate for these conditions. General treatment may include:  Elevation of the affected body part above the level of your heart.  Compression of the affected body part. Pressure from elastic  bandages or support stockings squeezes the tissues and forces fluid back into the blood vessels. This keeps fluid from entering the tissues.  Restriction of fluid and salt intake.  Use of a water pill (diuretic). These medications are appropriate only for some types of edema. They pull fluid out of your body and make you urinate more often. This gets rid of fluid and reduces swelling, but diuretics can have side effects. Only use diuretics as directed by your health care provider.  Follow these instructions at home:  Keep the affected body part above the level of your heart when you are lying down.  Do not sit still or stand for prolonged periods.  Do not put anything directly under your knees when lying down.  Do not wear constricting clothing or garters on your upper legs.  Exercise your legs to work the fluid back into your blood vessels. This may help the swelling go down.  Wear elastic bandages or support stockings to reduce ankle swelling as directed by your health care provider.  Eat a low-salt diet to reduce fluid if your health care provider recommends it.  Only take medicines as directed by your health care provider. Contact a health care provider if:  Your edema is not responding to treatment.  You have heart, liver, or kidney disease and notice symptoms of edema.  You have edema in your legs that does not improve after elevating them.  You have sudden and unexplained weight gain. Get help right away if:  You develop shortness of breath or chest pain.  You cannot breathe when you lie down.  You develop pain, redness, or warmth in the swollen areas.  You have heart, liver, or kidney disease and suddenly get edema.  You have a fever and your symptoms suddenly get worse. This information is not intended to replace advice given to you by your health care provider. Make sure you discuss any questions you have with your health care provider. Document Released:  11/20/2005 Document Revised: 04/27/2016 Document Reviewed: 09/12/2013 Elsevier Interactive Patient Education  2017 [employer]  Malnutrition Malnutrition is any condition in which nutrition is poor. There are many forms of malnutrition. A common form is having too little of one kind of nutrient (nutritional deficiency). Nutrients include proteins, minerals, carbohydrates, fats, and vitamins. They provide the body with energy and keep the body working normally. Malnutrition ranges from mild to severe. The condition affects the body's defense system (immune system). Because of this, people who are malnourished are more likely to develop health problems and get sick. What are the causes? Causes of malnutrition include:  Eating an unbalanced diet.  Eating too much of certain foods.  Eating too little.  Conditions that decrease the body's ability to use nutrients.  What increases the risk? Risk factors include:  Pregnancy and lactation. Women who are pregnant may become malnourished if they do not increase their nutrient intake. They are also susceptible to folic acid deficiency.  Increasing age. The body's ability to absorb nutrients decreases with age. This can contribute to iron, calcium, and vitamin D deficiencies.  Alcohol or drug dependency. Addiction often leads to a lifestyle in which proper nourishment is ignored. Dependency can also hurt the metabolism and the body's ability to absorb nutrients. Alcoholism is a major cause of thiamine deficiency and can lead to deficiencies of magnesium, zinc, and other vitamins.  Eating disorders, such as anorexia nervosa. People with these disorders may eat too little or too much.  Chewing or swallowing problems. People with these disorders may not eat enough.  Certain diseases, including: ? Long-lasting (chronic) diseases. Chronic diseases tend to affect the absorption of calcium, iron, and vitamins B12, A, D, E, and K. ? Liver disease.  Liver disease affects the storage of vitamins A and B12. It also interferes with the metabolism of protein and energy sources. ? Kidney disease. Kidney disease may cause deficiencies of protein, iron, and vitamin D. ? Cancer or AIDS. These diseases can cause a loss of appetite. ? Cystic fibrosis. This disease can make it difficult for the body to absorb nutrients.  Certain diets, including. ? The vegetarian diet. Vegetarians are at risk for iron deficiency. ? The vegan diet. Vegans are susceptible to vitamin B12, calcium, iron, vitamin D, and zinc deficiencies. ? The fruitarian diet. This diet can be deficient in protein, sodium, and many micronutrients. ? Many commercial "fad" diets, including those that claim to enhance well-being and reduce weight. ? Very low calorie diets.  Low income. People with a low income may have trouble paying for nutritious foods.  What are the signs or symptoms? Signs and symptoms depend on the kind of malnutrition you have. Common symptoms include:  Fatigue.  Weakness.  Dizziness.  Fainting  Weight loss.  Poor immune response.  Lack of menstruation.  Hair loss.  Poor memory.  How is this diagnosed? Malnutrition may be diagnosed by:  A medical history.  A dietary history.  A physical exam. This may include a measurement of your  body mass index (BMI).  Blood tests.  How is this treated? Treatments vary depending on the cause of the malnutrition. Common treatments include:  Dietary changes.  Dietary supplements, such as vitamins and minerals.  Treatment of any underlying conditions.  Follow these instructions at home:  Eat a balanced diet.  Take dietary supplements as directed by your health care provider.  Exercise regularly. Exercising can improve appetite.  Keep all follow-up visits as directed by your health care provider. This is important. How is this prevented? Eating a well-balanced diet helps to prevent most forms  of malnutrition. Contact a health care provider if:  You have increased weakness or fatigue.  You faint.  You stop menstruating.  You have rapid hair loss.  You have unexpected weight loss. This information is not intended to replace advice given to you by your health care provider. Make sure you discuss any questions you have with your health care provider. Document Released: 10/06/2005 Document Revised: 04/27/2016 Document Reviewed: 07/17/2014 Elsevier Interactive Patient Education  [employer].

## 2018-03-27 NOTE — Progress Notes (Signed)
ANTICOAGULATION CONSULT NOTE - Initial Consult  Pharmacy Consult for heparin gtt Indication: DVT  Allergies  Allergen Reactions  . Penicillins Shortness Of Breath, Rash and Other (See Comments)    Has patient had a PCN reaction causing immediate rash, facial/tongue/throat swelling, SOB or lightheadedness with hypotension: Yes Has patient had a PCN reaction causing severe rash involving mucus membranes or skin necrosis: No Has patient had a PCN reaction that required hospitalization No Has patient had a PCN reaction occurring within the last 10 years: No If all of the above answers are "NO", then may proceed with Cephalosporin use.  . Sulfa Antibiotics Shortness Of Breath, Rash and Other (See Comments)  . Amitiza [Lubiprostone]     N/v/d  . Aspirin Other (See Comments)    Reaction:  Unknown  Other reaction(s): Bleeding (intolerance) Per patient " causes nose to bleed" Can take 81 mg daily without any complications Other reaction(s): "bloody nose"   . Penicillin G Other (See Comments)    Has patient had a PCN reaction causing immediate rash, facial/tongue/throat swelling, SOB or lightheadedness with hypotension: No Has patient had a PCN reaction causing severe rash involving mucus membranes or skin necrosis: Unknown Has patient had a PCN reaction that required hospitalization: Unknown Has patient had a PCN reaction occurring within the last 10 years: Unknown If all of the above answers are "NO", then may proceed with Cephalosporin use.    Patient Measurements: Height: 5\' 9"  (175.3 cm) Weight: 207 lb (93.9 kg) IBW/kg (Calculated) : 66.2 Heparin Dosing Weight: 86.1kg  Vital Signs: Temp: 97.8 F (36.6 C) (04/24 1802) Temp Source: Oral (04/24 1802) BP: 131/48 (04/24 1802) Pulse Rate: 71 (04/24 1802)  Labs: Recent Labs    03/27/18 1815  HGB 12.3  HCT 35.8  PLT 196  APTT 27  LABPROT 13.3  INR 1.02  CREATININE 1.15*  TROPONINI <0.03    Estimated Creatinine Clearance:  40.5 mL/min (A) (by C-G formula based on SCr of 1.15 mg/dL (H)).   Medical History: Past Medical History:  Diagnosis Date  . Acoustic neuroma ([place])   . Allergy   . Asthma   . Bilateral swelling of feet    and legs  . Bladder infection   . CAD (coronary artery disease)   . Cataract   . Change in voice   . Compression fracture of body of thoracic vertebra (HCC)    T12 09/18/15 MRI s/p fall   . Constipation   . COPD (chronic obstructive pulmonary disease) ([place])    previous CXR with chronic interstitial lung dz   . CVA (cerebral vascular accident) ([place])   . Depression   . Diabetes ([place])    with neuropathy  . Diabetes mellitus, type 2 ([place])   . Diarrhea   . Double vision   . DVT (deep venous thrombosis) ([place])    right leg 10/2015 was on coumadin off as of 2017/2018 ; s/p IVC filter  . Enuresis   . Eye pain, right   . Fall   . Fatty liver    09/15/15 also mildly dilated pancreatitic duct rec MRCP small sub cm cyst hemangioma speeln mild right hydronephrorossi and prox. hydroureter, kidney stones, mild scarring kidneys  . Female stress incontinence   . Flank pain   . GERD (gastroesophageal reflux disease)    with small hiatal hernia   . Hard of hearing   . Heart disease   . History of kidney problems   . Hyperlipidemia    mixed  .  Hypertension   . Hypothyroidism, postsurgical   . Impaired mobility and ADLs    uses rolling walker has caretaker 24/7 at home  . Leg edema   . Mixed incontinence urge and stress (female)(female)   . Neuropathy   . Osteoarthritis    DDD spine   . Osteoporosis with fracture    T12 compression fracture  . Photophobia   . Pulmonary embolism ([place])    10/2015 off coumadin as of 04/2016  . Pulmonary HTN (HCC)    mild pulm HTN, echo 10/09/15 EF 92-11%HERDE 1 dd, RV systolic pressure increased   . Recurrent UTI   . Sinus pressure   . Skin cancer    BCC jawline and scalp   . Thyroid disease    follows [facility]  . TIA (transient ischemic  attack)    MRI 2009/2010 neg stroke   . Trigeminal neuralgia    Dr. Tomi Bamberger s/p gamma knife x 2, on Tegretol since 2011/2012 no increase in dose >200 mg bid rec per family per neurology   . Urinary frequency   . Urinary, incontinence, stress female    Dr Erlene Quan urology     Medications:   (Not in a hospital admission) Scheduled:  . heparin  4,000 Units Intravenous Once   Infusions:  . heparin     PRN:  Anti-infectives (From admission, onward)   None      Assessment: 84 year old female with dvt requiring heparin infusion. Dr Cinda Quest gave heparin 4000unit bolus x 1    Goal of Therapy:  Heparin level 0.3-0.7 units/ml Monitor platelets by anticoagulation protocol: Yes   Plan:  Give 4000 units bolus x 1 Start heparin infusion at 1400 units/hr Check anti-Xa level in 8 hours and daily while on heparin Continue to monitor H&H and platelets  Donna Christen Lawren Sexson 03/27/2018,7:47 PM

## 2018-03-27 NOTE — Progress Notes (Signed)
Pre visit review using our clinic review tool, if applicable. No additional management support is needed unless otherwise documented below in the visit note. 

## 2018-03-27 NOTE — ED Triage Notes (Signed)
FIRST NURSE NOTE-sent for DVT on [place].

## 2018-03-27 NOTE — Progress Notes (Signed)
Advanced care plan.  Purpose of the Encounter: CODE STATUS  Parties in Attendance:Patient and family Patient's Decision Capacity:Good Subjective/Patient's story: Presented to the emergency room with swelling and pain in the lower extremities Objective/Medical story Extensive bilateral lower extremity DVT Goals of care determination:  Advanced directives discussed with patient and family For now they want everything done which includes cardiac resuscitation, intubation and ventilator of the need arises Goals not met CODE STATUS: Full code Time spent discussing advanced care planning: 16 minutes

## 2018-03-27 NOTE — H&P (Signed)
[facility] [place] NAME: Nereida Schepp    MR#:  [id number]  DATE OF BIRTH:  1926-03-12  DATE OF ADMISSION:  03/27/2018  PRIMARY CARE PHYSICIAN: McLean-Scocuzza, Nino Glow, MD   REQUESTING/REFERRING PHYSICIAN:   CHIEF COMPLAINT:   Chief Complaint  Patient presents with  . blood clots    HISTORY OF PRESENT ILLNESS: Kanisha Duba  is a 84 y.o. female with a known history of DVT, pulmonary embolism, coronary artery disease, diabetes mellitus type 2, CVA presented to the emergency room with increasing the size of the clots in her legs.  She had a Doppler ultrasound ordered by her physician which showed multiple bilateral extensive deep venous thrombosis in the lower extremities.  Patient has an IVC filter placed in the past.  Patient started on IV heparin drip for anticoagulation in the emergency room plan is to consult vascular surgery for thrombectomy.  Denies pain in the lower extremities which is aching in nature 5 out of 10 on a scale of 1-10.  Also has swelling in the lower extremity secondary to DVT no complaints of any chest pain, shortness of breath.  PAST MEDICAL HISTORY:   Past Medical History:  Diagnosis Date  . Acoustic neuroma ([place])   . Allergy   . Asthma   . Bilateral swelling of feet    and legs  . Bladder infection   . CAD (coronary artery disease)   . Cataract   . Change in voice   . Compression fracture of body of thoracic vertebra (HCC)    T12 09/18/15 MRI s/p fall   . Constipation   . COPD (chronic obstructive pulmonary disease) ([place])    previous CXR with chronic interstitial lung dz   . CVA (cerebral vascular accident) ([place])   . Depression   . Diabetes ([place])    with neuropathy  . Diabetes mellitus, type 2 ([place])   . Diarrhea   . Double vision   . DVT (deep venous thrombosis) ([place])    right leg 10/2015 was on coumadin off as of 2017/2018 ; s/p IVC filter  . Enuresis   . Eye pain, right   . Fall   . Fatty  liver    09/15/15 also mildly dilated pancreatitic duct rec MRCP small sub cm cyst hemangioma speeln mild right hydronephrorossi and prox. hydroureter, kidney stones, mild scarring kidneys  . Female stress incontinence   . Flank pain   . GERD (gastroesophageal reflux disease)    with small hiatal hernia   . Hard of hearing   . Heart disease   . History of kidney problems   . Hyperlipidemia    mixed  . Hypertension   . Hypothyroidism, postsurgical   . Impaired mobility and ADLs    uses rolling walker has caretaker 24/7 at home  . Leg edema   . Mixed incontinence urge and stress (female)(female)   . Neuropathy   . Osteoarthritis    DDD spine   . Osteoporosis with fracture    T12 compression fracture  . Photophobia   . Pulmonary embolism ([place])    10/2015 off coumadin as of 04/2016  . Pulmonary HTN (HCC)    mild pulm HTN, echo 10/09/15 EF 75-10%CHENI 1 dd, RV systolic pressure increased   . Recurrent UTI   . Sinus pressure   . Skin cancer    BCC jawline and scalp   . Thyroid disease    follows [facility]  . TIA (  transient ischemic attack)    MRI 2009/2010 neg stroke   . Trigeminal neuralgia    Dr. Tomi Bamberger s/p gamma knife x 2, on Tegretol since 2011/2012 no increase in dose >200 mg bid rec per family per neurology   . Urinary frequency   . Urinary, incontinence, stress female    Dr Erlene Quan urology     PAST SURGICAL HISTORY:  Past Surgical History:  Procedure Laterality Date  . APPENDECTOMY     as a child, open  . BRAIN SURGERY     schwnnoma removal 1996   . brain tumor surgery    . BREAST SURGERY     breast bx  . CATARACT EXTRACTION    . CHOLECYSTECTOMY    . EYE SURGERY     cataract  . IVC FILTER PLACEMENT ([facility] HX)     Dr. Lucky Cowboy 10/2015   . LAPAROSCOPIC TUBAL LIGATION    . MOHS SURGERY     scalp 04/2014   . PERIPHERAL VASCULAR CATHETERIZATION N/A 10/11/2015   Procedure: IVC Filter Insertion;  Surgeon: Algernon Huxley, MD;  Location: [facility];  Service:  Cardiovascular;  Laterality: N/A;  . PUBOVAGINAL SLING    . THROAT SURGERY    . THYROID SURGERY     tumor around vocal cords   . TOOTH EXTRACTION     winter 2018   . TOTAL THYROIDECTOMY  1976    SOCIAL HISTORY:  Social History   Tobacco Use  . Smoking status: Former Smoker    Types: Cigarettes    Last attempt to quit: 09/20/1995    Years since quitting: 22.5  . Smokeless tobacco: Never Used  . Tobacco comment: quit 1996 smoked 20 years max 8 cig qd   Substance Use Topics  . Alcohol use: No    FAMILY HISTORY:  Family History  Problem Relation Age of Onset  . Heart disease Mother   . Diabetes Father   . Cancer Daughter        breast ca x 2 s/p mastectomy     DRUG ALLERGIES:  Allergies  Allergen Reactions  . Penicillins Shortness Of Breath, Rash and Other (See Comments)    Has patient had a PCN reaction causing immediate rash, facial/tongue/throat swelling, SOB or lightheadedness with hypotension: Yes Has patient had a PCN reaction causing severe rash involving mucus membranes or skin necrosis: No Has patient had a PCN reaction that required hospitalization No Has patient had a PCN reaction occurring within the last 10 years: No If all of the above answers are "NO", then may proceed with Cephalosporin use.  . Sulfa Antibiotics Shortness Of Breath, Rash and Other (See Comments)  . Amitiza [Lubiprostone]     N/v/d  . Aspirin Other (See Comments)    Reaction:  Unknown  Other reaction(s): Bleeding (intolerance) Per patient " causes nose to bleed" Can take 81 mg daily without any complications Other reaction(s): "bloody nose"   . Penicillin G Other (See Comments)    Has patient had a PCN reaction causing immediate rash, facial/tongue/throat swelling, SOB or lightheadedness with hypotension: No Has patient had a PCN reaction causing severe rash involving mucus membranes or skin necrosis: Unknown Has patient had a PCN reaction that required hospitalization: Unknown Has  patient had a PCN reaction occurring within the last 10 years: Unknown If all of the above answers are "NO", then may proceed with Cephalosporin use.    REVIEW OF SYSTEMS:   CONSTITUTIONAL: No fever, fatigue or weakness.  EYES: No blurred or double vision.  EARS, NOSE, AND THROAT: No tinnitus or ear pain.  RESPIRATORY: No cough, shortness of breath, wheezing or hemoptysis.  CARDIOVASCULAR: No chest pain, orthopnea, edema.  GASTROINTESTINAL: No nausea, vomiting, diarrhea or abdominal pain.  GENITOURINARY: No dysuria, hematuria.  ENDOCRINE: No polyuria, nocturia,  HEMATOLOGY: No anemia, easy bruising or bleeding SKIN: No rash or lesion. MUSCULOSKELETAL: No joint pain or arthritis.   Has swelling and pain in the lower extremities NEUROLOGIC: No tingling, numbness, weakness.  PSYCHIATRY: No anxiety or depression.   MEDICATIONS AT HOME:  Prior to Admission medications   Medication Sig Start Date End Date Taking? Authorizing Provider  aspirin EC 81 MG tablet Take 81 mg by mouth daily.   Yes [provider]  atorvastatin (LIPITOR) 40 MG tablet Take 40 mg by mouth daily at 6 PM.    Yes [provider]  budesonide-formoterol (SYMBICORT) 160-4.5 MCG/ACT inhaler Inhale 2 puffs into the lungs 2 (two) times daily. 01/31/18  Yes McLean-Scocuzza, Nino Glow, MD  Calcium Carbonate-Vitamin D (CALCIUM 600+D) 600-400 MG-UNIT tablet Take 1 tablet by mouth daily.    Yes [provider]  carbamazepine (TEGRETOL) 200 MG tablet Take 1 tablet (200 mg total) by mouth 2 (two) times daily. 03/08/18  Yes Vaughan Basta, MD  docusate sodium (COLACE) 100 MG capsule Take 1 capsule (100 mg total) by mouth 2 (two) times daily. 03/08/18  Yes Vaughan Basta, MD  fluticasone (FLONASE) 50 MCG/ACT nasal spray Place 1 spray into both nostrils daily.    Yes [provider]  furosemide (LASIX) 40 MG tablet Take 1 tablet (40 mg total) by mouth daily. 03/27/18  Yes McLean-Scocuzza,  Nino Glow, MD  gabapentin (NEURONTIN) 100 MG capsule Take 100 mg by mouth 2 (two) times daily.  03/26/18  Yes [provider]  guaiFENesin (MUCINEX) 600 MG 12 hr tablet Take 600 mg by mouth daily.    Yes [provider]  HYDROcodone-acetaminophen (NORCO/VICODIN) 5-325 MG tablet Take 1 tablet by mouth every 6 (six) hours as needed for moderate pain or severe pain. 03/11/18  Yes Toni Arthurs, NP  insulin regular (NOVOLIN R,HUMULIN R) 100 units/mL injection Inject 0-12 Units into the skin 3 (three) times daily before meals. 100 unit/mL; amt: Per Sliding Scale; If Blood Sugar is 176 to 250, give 3 Units.If Blood Sugar is 251 to 325, give 6 Units.If Blood Sugar is 326 to 450, give 10 Units. If Blood Sugar is greater than 450, give 12 Units and recheck in 1 hour. Notify MD/NP if >450 after recheck. Check blood sugar AC & HS   Yes [provider]  levothyroxine (SYNTHROID, LEVOTHROID) 200 MCG tablet Take 200 mcg by mouth daily before breakfast. Except on Sunday. Do not take with other medications   Yes [provider]  metFORMIN (GLUCOPHAGE-XR) 500 MG 24 hr tablet Take 500 mg by mouth daily with breakfast.   Yes [provider]  midodrine (PROAMATINE) 10 MG tablet Take 1 tablet (10 mg total) by mouth 3 (three) times daily with meals. 03/08/18  Yes Vaughan Basta, MD  Multiple Vitamin (MULTIVITAMIN WITH MINERALS) TABS tablet Take 1 tablet by mouth daily.   Yes [provider]  Nutritional Supplements (ENSURE ENLIVE PO) Take 1 Bottle by mouth 2 (two) times daily.   Yes [provider]  pantoprazole (PROTONIX) 40 MG tablet Take 1 tablet (40 mg total) by mouth daily. 12/27/17  Yes McLean-Scocuzza, Nino Glow, MD  Polyethyl Glycol-Propyl Glycol 0.4-0.3 % SOLN Place  1 drop into both eyes at bedtime.    Yes [provider]  tiotropium (SPIRIVA) 18 MCG inhalation capsule Place 1 capsule (18 mcg total) into inhaler and inhale daily. 01/10/18  Yes  McLean-Scocuzza, Nino Glow, MD  traZODone (DESYREL) 50 MG tablet Take 0.5 tablets (25 mg total) by mouth at bedtime as needed for sleep. 03/08/18  Yes Vaughan Basta, MD  acetaminophen (TYLENOL) 325 MG tablet Take 650 mg by mouth every 6 (six) hours as needed.    [provider]  Amino Acids-Protein Hydrolys (FEEDING SUPPLEMENT, PRO-STAT SUGAR FREE 64,) LIQD Take 30 mLs by mouth 2 (two) times daily between meals.    [provider]  bisacodyl (DULCOLAX) 5 MG EC tablet Take 1 tablet (5 mg total) by mouth daily as needed for moderate constipation. 03/08/18   Vaughan Basta, MD  magnesium hydroxide (MILK OF MAGNESIA) 400 MG/5ML suspension Take 30 mLs by mouth every 4 (four) hours as needed. Constipation/ no BM for 2 days    [provider]  polyethylene glycol powder (GLYCOLAX/MIRALAX) powder Take 17 g by mouth daily as needed for mild constipation.  12/10/15   [provider]      PHYSICAL EXAMINATION:   VITAL SIGNS: Blood pressure 135/62, pulse 67, temperature 97.8 F (36.6 C), temperature source Oral, resp. rate 20, height 5\' 9"  (1.753 m), weight 93.9 kg (207 lb), SpO2 95 %.  GENERAL:  84 y.o.-year-old patient lying in the bed with no acute distress.  EYES: Pupils equal, round, reactive to light and accommodation. No scleral icterus. Extraocular muscles intact.  HEENT: Head atraumatic, normocephalic. Oropharynx and nasopharynx clear.  NECK:  Supple, no jugular venous distention. No thyroid enlargement, no tenderness.  LUNGS: Normal breath sounds bilaterally, no wheezing, rales,rhonchi or crepitation. No use of accessory muscles of respiration.  CARDIOVASCULAR: S1, S2 normal. No murmurs, rubs, or gallops.  ABDOMEN: Soft, nontender, nondistended. Bowel sounds present. No organomegaly or mass.  EXTREMITIES: No cyanosis, or clubbing.  Swelling and pain in the lower extremities NEUROLOGIC: Cranial nerves II through XII are intact. Muscle strength 5/5 in  all extremities. Sensation intact. Gait not checked.  PSYCHIATRIC: The patient is alert and oriented x 3.  SKIN: No obvious rash, lesion, or ulcer.   LABORATORY PANEL:   CBC Recent Labs  Lab 03/27/18 1815  WBC 8.2  HGB 12.3  HCT 35.8  PLT 196  MCV 94.2  MCH 32.5  MCHC 34.5  RDW 15.1*   ------------------------------------------------------------------------------------------------------------------  Chemistries  Recent Labs  Lab 03/27/18 1815  NA 139  K 4.0  CL 100*  CO2 32  GLUCOSE 113*  BUN 20  CREATININE 1.15*  CALCIUM 8.5*  AST 25  ALT 13*  ALKPHOS 81  BILITOT 0.3   ------------------------------------------------------------------------------------------------------------------ estimated creatinine clearance is 40.5 mL/min (A) (by C-G formula based on SCr of 1.15 mg/dL (H)). ------------------------------------------------------------------------------------------------------------------ No results for input(s): TSH, T4TOTAL, T3FREE, THYROIDAB in the last 72 hours.  Invalid input(s): FREET3   Coagulation profile Recent Labs  Lab 03/27/18 1815  INR 1.02   ------------------------------------------------------------------------------------------------------------------- No results for input(s): DDIMER in the last 72 hours. -------------------------------------------------------------------------------------------------------------------  Cardiac Enzymes Recent Labs  Lab 03/27/18 1815  TROPONINI <0.03   ------------------------------------------------------------------------------------------------------------------ Invalid input(s): POCBNP  ---------------------------------------------------------------------------------------------------------------  Urinalysis    Component Value Date/Time   COLORURINE AMBER (A) 03/03/2018 1703   APPEARANCEUR HAZY (A) 03/03/2018 1703   APPEARANCEUR Clear 02/02/2016 1518   LABSPEC 1.024 03/03/2018 1703    PHURINE 5.0 03/03/2018 1703   GLUCOSEU NEGATIVE 03/03/2018  [place] 12/24/2017 0931   HGBUR NEGATIVE 03/03/2018 1703   BILIRUBINUR NEGATIVE 03/03/2018 1703   BILIRUBINUR Negative 02/02/2016 1518   [place] 03/03/2018 1703   PROTEINUR 30 (A) 03/03/2018 1703   UROBILINOGEN 0.2 12/24/2017 0931   NITRITE NEGATIVE 03/03/2018 1703   LEUKOCYTESUR NEGATIVE 03/03/2018 1703   LEUKOCYTESUR Negative 02/02/2016 1518     RADIOLOGY: US Venous Img Lower Bilateral  Result Date: 03/27/2018 CLINICAL DATA:  84 year old female with bilateral lower extremity edema for the past 2 weeks EXAM: BILATERAL LOWER EXTREMITY VENOUS DOPPLER ULTRASOUND TECHNIQUE: Gray-scale sonography with graded compression, as well as color Doppler and duplex ultrasound were performed to evaluate the lower extremity deep venous systems from the level of the common femoral vein and including the common femoral, femoral, profunda femoral, popliteal and calf veins including the posterior tibial, peroneal and gastrocnemius veins when visible. The superficial great saphenous vein was also interrogated. Spectral Doppler was utilized to evaluate flow at rest and with distal augmentation maneuvers in the common femoral, femoral and popliteal veins. COMPARISON:  Prior left lower extremity duplex venous ultrasound 05/03/2017 FINDINGS: RIGHT LOWER EXTREMITY Common Femoral Vein: Abnormal. The vessel is not compressible. The lumen is expanded and filled with low-level internal echoes. Minimal flow on color Doppler evaluation. Findings are consistent with occlusive DVT. Saphenofemoral Junction: No evidence of thrombus. Normal compressibility and flow on color Doppler imaging. Profunda Femoral Vein: Thrombus extends into the profunda femoral vein. This thrombus is nonocclusive. Femoral Vein: Thrombus extends into the femoral vein throughout the thigh. This is occlusive. Popliteal Vein: Popliteal vein is not well seen. There is  evidence of color flow, however the vessel is incompletely compressible. Suspect nonocclusive thrombus. Calf Veins: Poor visualization of the calf veins. Superficial Great Saphenous Vein: No evidence of thrombus. Normal compressibility. Venous Reflux:  None. Other Findings:  None. LEFT LOWER EXTREMITY Common Femoral Vein: Abnormal. The vessel is only partially compressible and the lumen is expanded with low-level internal echoes. No color flow on color Doppler evaluation consistent with occlusive thrombus. Saphenofemoral Junction: No evidence of thrombus. Normal compressibility and flow on color Doppler imaging. Profunda Femoral Vein: Occlusive thrombus extends into the profunda femoral vein. Femoral Vein: Occlusive thrombus extends into the superficial femoral vein and remains occlusive throughout the thigh. There may be some partial reconstitution of flow in the distal thigh. Popliteal Vein: Partially compressible. Evidence of at least some color flow on color Doppler evaluation. Findings are consistent with nonocclusive thrombus. Calf Veins: Poor visualization of the calf veins. Superficial Great Saphenous Vein: No evidence of thrombus. Normal compressibility. Venous Reflux:  None. Other Findings:  None. IMPRESSION: 1. Positive for extensive bilateral DVT extending from the common femoral vein through the femoral vein and into the popliteal veins bilaterally. These results will be called to the ordering clinician or representative by the Radiologist Assistant, and communication documented in the PACS or zVision Dashboard. Electronically Signed   By: Jacqulynn Cadet M.D.   On: 03/27/2018 16:11    EKG: Orders placed or performed during the hospital encounter of 03/05/18  . ED EKG  . ED EKG    IMPRESSION AND PLAN: 84 year old elderly female patient with history of DVT in the lower extremity, pulmonary embolism, coronary artery disease, CVA, type 2 diabetes mellitus, hypertension was referred by her  physician for extensive DVT in the lower extremity.  Bilateral lower extremity DVT IV heparin drip for anticoagulation Vascular surgery consultation for thrombectomy  Diabetes mellitus type 2 Resume diabetic medication  with sliding scale coverage with insulin  Coronary artery disease Resume aspirin and statin  Hypertension Medical management  DVT prophylaxis On anticoagulation with heparin drip  All the records are reviewed and case discussed with ED provider. Management plans discussed with the patient, family and they are in agreement.  CODE STATUS:Full code Code Status History    Date Active Date Inactive Code Status Order ID Comments User Context   03/06/2018 0108 03/08/2018 1842 Full Code [id number]  Amelia Jo, MD ED   03/04/2018 2230 03/05/2018 1742 Full Code [id number]  Amelia Jo, MD Inpatient   10/08/2015 2112 10/13/2015 1651 Full Code [id number]  Theodoro Grist, MD Inpatient   09/16/2015 0234 09/19/2015 0236 Full Code [id number]  Fritzi Mandes, MD Inpatient    Advance Directive Documentation     Most Recent Value  Type of Advance Directive  Healthcare Power of Attorney  Pre-existing out of facility DNR order (yellow form or pink MOST form)  -  "MOST" Form in Place?  -       TOTAL TIME TAKING CARE OF THIS PATIENT: 53 minutes.    Saundra Shelling M.D on 03/27/2018 at 8:47 PM  Between 7am to 6pm - Pager - [phone]  After 6pm go to [email or web address] - password [email or web address] [facility]  [facility]  Office  [phone]  CC: Primary care physician; McLean-Scocuzza, Nino Glow, MD

## 2018-03-27 NOTE — Telephone Encounter (Signed)
Copied from [place] [id number]. Topic: General - Other >> Mar 27, 2018 11:54 AM Yvette Rack wrote: Reason for CRM: Judeen Hammans Nurse from [place] [phone] calling for a discontinuing order  faxed for the pt to stop taking the  Lasix 30mg  and to start taking the Lasix 40mg  and to stop taking the Carafate the pt daughter states that Aundra Dubin wanted pt to stop taking Carafate and that the Lasix medication dosage has been changed  The fax number is [phone]

## 2018-03-27 NOTE — Telephone Encounter (Signed)
Call report  Received from imaging, patient positive for DVT bilaterally, called PCP and notified PCP advised to have patient go to ER so proper medications can be started. Called patient daughter Wells Guiles and advised daughter patient needs to go to the ER now due to DX of DVT , explained to daughter this a blood clot and needs treatment. Daughter stated she would take patient to the ER now. Called [facility] ER notified triage nurse of patient arrival and that patient is positive for DVT.

## 2018-03-27 NOTE — Telephone Encounter (Signed)
Please advise 

## 2018-03-27 NOTE — ED Triage Notes (Signed)
Pt her for DVT's at PCP - first nurse spoke with Dr Clearnce Hasten about Ultrasound results and pt to be admitted

## 2018-03-27 NOTE — ED Provider Notes (Signed)
[facility] Emergency Department Provider Note   ____________________________________________   First MD Initiated Contact with Patient 03/27/18 1921     (approximate)  I have reviewed the triage vital signs and the nursing notes.   HISTORY  Chief Complaint blood clots    HP Jessica Erickson is a 84 y.o. female patient comes in with her family who report her legs got about 20% bigger in the last few days.  Her doctor saw her and ordered an echo of her heart and ultrasound of her legs.  The ultrasound of legs was done and shows multiple bilateral clots.  Patient has had clots before has been on Coumadin and has had a filter put in I will call Dr. do and ask his opinion of what we should do next.  Patient's doctor is wanted her to be admitted.   Past Medical History:  Diagnosis Date  . Acoustic neuroma ([place])   . Allergy   . Asthma   . Bilateral swelling of feet    and legs  . Bladder infection   . CAD (coronary artery disease)   . Cataract   . Change in voice   . Compression fracture of body of thoracic vertebra (HCC)    T12 09/18/15 MRI s/p fall   . Constipation   . COPD (chronic obstructive pulmonary disease) ([place])    previous CXR with chronic interstitial lung dz   . CVA (cerebral vascular accident) ([place])   . Depression   . Diabetes ([place])    with neuropathy  . Diabetes mellitus, type 2 ([place])   . Diarrhea   . Double vision   . DVT (deep venous thrombosis) ([place])    right leg 10/2015 was on coumadin off as of 2017/2018 ; s/p IVC filter  . Enuresis   . Eye pain, right   . Fall   . Fatty liver    09/15/15 also mildly dilated pancreatitic duct rec MRCP small sub cm cyst hemangioma speeln mild right hydronephrorossi and prox. hydroureter, kidney stones, mild scarring kidneys  . Female stress incontinence   . Flank pain   . GERD (gastroesophageal reflux disease)    with small hiatal hernia   . Hard of hearing   . Heart disease   . History of  kidney problems   . Hyperlipidemia    mixed  . Hypertension   . Hypothyroidism, postsurgical   . Impaired mobility and ADLs    uses rolling walker has caretaker 24/7 at home  . Leg edema   . Mixed incontinence urge and stress (female)(female)   . Neuropathy   . Osteoarthritis    DDD spine   . Osteoporosis with fracture    T12 compression fracture  . Photophobia   . Pulmonary embolism ([place])    10/2015 off coumadin as of 04/2016  . Pulmonary HTN (HCC)    mild pulm HTN, echo 10/09/15 EF 42-35%TIRWE 1 dd, RV systolic pressure increased   . Recurrent UTI   . Sinus pressure   . Skin cancer    BCC jawline and scalp   . Thyroid disease    follows [facility]  . TIA (transient ischemic attack)    MRI 2009/2010 neg stroke   . Trigeminal neuralgia    Dr. Tomi Bamberger s/p gamma knife x 2, on Tegretol since 2011/2012 no increase in dose >200 mg bid rec per family per neurology   . Urinary frequency   . Urinary, incontinence, stress female  Dr Erlene Quan urology     Patient Active Problem List   Diagnosis Date Noted  . Moderate protein-calorie malnutrition ([place]) 03/22/2018  . Orthostasis 03/05/2018  . Dizziness 03/04/2018  . COPD (chronic obstructive pulmonary disease) ([place]) 12/13/2017  . Anxiety and depression 12/13/2017  . Trigeminal neuralgia 12/13/2017  . GERD (gastroesophageal reflux disease) 12/13/2017  . Diabetes mellitus type 2, controlled ([place]) 12/13/2017  . Constipation 12/13/2017  . Insomnia 12/13/2017  . Hypothyroidism 12/13/2017  . UTI (urinary tract infection) 10/08/2015  . Collapsed vertebra, not elsewhere classified, thoracic region, initial encounter for fracture ([place]) 09/20/2015  . Basal cell carcinoma of scalp 04/06/2014  . Fothergill's neuralgia 08/05/2013  . Bladder infection, chronic 02/11/2013  . Female genuine stress incontinence 02/11/2013  . Incomplete bladder emptying 02/11/2013  . Intrinsic sphincter deficiency 02/11/2013  . Mixed incontinence  02/11/2013  . Excessive urination at night 02/11/2013  . Bladder retention 02/11/2013  . FOM (frequency of micturition) 02/11/2013  . Basal cell carcinoma of face 09/13/2011    Past Surgical History:  Procedure Laterality Date  . APPENDECTOMY     as a child, open  . BRAIN SURGERY     schwnnoma removal 1996   . brain tumor surgery    . BREAST SURGERY     breast bx  . CATARACT EXTRACTION    . CHOLECYSTECTOMY    . EYE SURGERY     cataract  . IVC FILTER PLACEMENT ([facility] HX)     Dr. Lucky Cowboy 10/2015   . LAPAROSCOPIC TUBAL LIGATION    . MOHS SURGERY     scalp 04/2014   . PERIPHERAL VASCULAR CATHETERIZATION N/A 10/11/2015   Procedure: IVC Filter Insertion;  Surgeon: Algernon Huxley, MD;  Location: [facility];  Service: Cardiovascular;  Laterality: N/A;  . PUBOVAGINAL SLING    . THROAT SURGERY    . THYROID SURGERY     tumor around vocal cords   . TOOTH EXTRACTION     winter 2018   . TOTAL THYROIDECTOMY  1976    Prior to Admission medications   Medication Sig Start Date End Date Taking? Authorizing Provider  acetaminophen (TYLENOL) 325 MG tablet Take 650 mg by mouth every 6 (six) hours as needed.    [provider]  Amino Acids-Protein Hydrolys (FEEDING SUPPLEMENT, PRO-STAT SUGAR FREE 64,) LIQD Take 30 mLs by mouth 2 (two) times daily between meals.    [provider]  aspirin EC 81 MG tablet Take 81 mg by mouth daily.    [provider]  atorvastatin (LIPITOR) 40 MG tablet Take 40 mg by mouth daily at 6 PM.     [provider]  bisacodyl (DULCOLAX) 5 MG EC tablet Take 1 tablet (5 mg total) by mouth daily as needed for moderate constipation. 03/08/18   Vaughan Basta, MD  budesonide-formoterol (SYMBICORT) 160-4.5 MCG/ACT inhaler Inhale 2 puffs into the lungs 2 (two) times daily. 01/31/18   McLean-Scocuzza, Nino Glow, MD  Calcium Carbonate-Vitamin D (CALCIUM 600+D) 600-400 MG-UNIT tablet Take 1 tablet by mouth daily.     [provider]    carbamazepine (TEGRETOL) 200 MG tablet Take 1 tablet (200 mg total) by mouth 2 (two) times daily. 03/08/18   Vaughan Basta, MD  docusate sodium (COLACE) 100 MG capsule Take 1 capsule (100 mg total) by mouth 2 (two) times daily. 03/08/18   Vaughan Basta, MD  fluticasone (FLONASE) 50 MCG/ACT nasal spray Place 1 spray into both nostrils daily.     [provider]  furosemide (LASIX) 40 MG tablet Take 1 tablet (40 mg total) by mouth daily. 03/27/18   McLean-Scocuzza, Nino Glow, MD  gabapentin (NEURONTIN) 100 MG capsule  03/26/18   [provider]  guaiFENesin (MUCINEX) 600 MG 12 hr tablet Take 600 mg by mouth daily.     [provider]  HYDROcodone-acetaminophen (NORCO/VICODIN) 5-325 MG tablet Take 1 tablet by mouth every 6 (six) hours as needed for moderate pain or severe pain. 03/11/18   Toni Arthurs, NP  insulin regular (NOVOLIN R,HUMULIN R) 100 units/mL injection Inject 0-12 Units into the skin 3 (three) times daily before meals. 100 unit/mL; amt: Per Sliding Scale; If Blood Sugar is 176 to 250, give 3 Units.If Blood Sugar is 251 to 325, give 6 Units.If Blood Sugar is 326 to 450, give 10 Units. If Blood Sugar is greater than 450, give 12 Units and recheck in 1 hour. Notify MD/NP if >450 after recheck. Check blood sugar AC & HS    [provider]  levothyroxine (SYNTHROID, LEVOTHROID) 200 MCG tablet Take 200 mcg by mouth daily before breakfast. Except on Sunday. Do not take with other medications    [provider]  magnesium hydroxide (MILK OF MAGNESIA) 400 MG/5ML suspension Take 30 mLs by mouth every 4 (four) hours as needed. Constipation/ no BM for 2 days    [provider]  metFORMIN (GLUCOPHAGE-XR) 500 MG 24 hr tablet Take 500 mg by mouth daily with breakfast.    [provider]  midodrine (PROAMATINE) 10 MG tablet Take 1 tablet (10 mg total) by mouth 3 (three) times daily with meals. 03/08/18   Vaughan Basta, MD   Multiple Vitamin (MULTIVITAMIN WITH MINERALS) TABS tablet Take 1 tablet by mouth daily.    [provider]  Nutritional Supplements (ENSURE ENLIVE PO) Take 1 Bottle by mouth 2 (two) times daily.    [provider]  pantoprazole (PROTONIX) 40 MG tablet Take 1 tablet (40 mg total) by mouth daily. 12/27/17   McLean-Scocuzza, Nino Glow, MD  Polyethyl Glycol-Propyl Glycol 0.4-0.3 % SOLN Place 1 drop into both eyes at bedtime.     [provider]  polyethylene glycol powder (GLYCOLAX/MIRALAX) powder Take 17 g by mouth daily as needed for mild constipation.  12/10/15   [provider]  tiotropium (SPIRIVA) 18 MCG inhalation capsule Place 1 capsule (18 mcg total) into inhaler and inhale daily. 01/10/18   McLean-Scocuzza, Nino Glow, MD  traZODone (DESYREL) 50 MG tablet Take 0.5 tablets (25 mg total) by mouth at bedtime as needed for sleep. 03/08/18   Vaughan Basta, MD    Allergies Penicillins; Sulfa antibiotics; Amitiza [lubiprostone]; Aspirin; and Penicillin g  Family History  Problem Relation Age of Onset  . Heart disease Mother   . Diabetes Father   . Cancer Daughter        breast ca x 2 s/p mastectomy     Social History Social History   Tobacco Use  . Smoking status: Former Smoker    Types: Cigarettes    Last attempt to quit: 09/20/1995    Years since quitting: 22.5  . Smokeless tobacco: Never Used  . Tobacco comment: quit 1996 smoked 20 years max 8 cig qd   Substance Use Topics  . Alcohol use: No  . Drug use: No    Review of Systems  Constitutional: No fever/chills Eyes: No visual changes. ENT: No sore throat. Cardiovascular: Denies chest pain. Respiratory: Denies shortness of breath. Gastrointestinal: No abdominal pain.  No  nausea, no vomiting.  No diarrhea.  No constipation. Genitourinary: Negative for dysuria. Musculoskeletal: Negative for back pain. Skin: Negative for rash. Neurological: Negative for headaches, focal weakness or    ____________________________________________   PHYSICAL EXAM:  VITAL SIGNS: ED Triage Vitals  Enc Vitals Group     BP 03/27/18 1802 (!) 131/48     Pulse Rate 03/27/18 1802 71     Resp 03/27/18 1802 15     Temp 03/27/18 1802 97.8 F (36.6 C)     Temp Source 03/27/18 1802 Oral     SpO2 03/27/18 1802 97 %     Weight 03/27/18 1801 207 lb (93.9 kg)     Height 03/27/18 1801 5\' 9"  (1.753 m)     Head Circumference --      Peak Flow --      Pain Score 03/27/18 1800 0     Pain Loc --      Pain Edu? --      Excl. in [place]? --     Constitutional: Alert and oriented. Well appearing and in no acute distress. Eyes: Conjunctivae are normal.  Head: Atraumatic. Nose: No congestion/rhinnorhea. Mouth/Throat: Mucous membranes are moist.  Oropharynx non-erythematous. Neck: No stridor.  Cardiovascular: Normal rate, regular rhythm. Grossly normal heart sounds.  Good peripheral circulation. Respiratory: Normal respiratory effort.  No retractions. Lungs crackles bilaterally Gastrointestinal: Soft and nontender. No distention. No abdominal bruits. No CVA tenderness. Musculoskeletal: No lower extremity tenderness 2+ edema bilaterally.   Neurologic:  Normal speech and language. No gross focal neurologic deficits are appreciated. Skin:  Skin is warm, dry and intact. No rash noted. Psychiatric: Mood and affect are normal. Speech and behavior are normal.  ____________________________________________   LABS (all labs ordered are listed, but only abnormal results are displayed)  Labs Reviewed  CBC - Abnormal; Notable for the following components:      Result Value   RDW 15.1 (*)    All other components within normal limits  COMPREHENSIVE METABOLIC PANEL - Abnormal; Notable for the following components:   Chloride 100 (*)    Glucose, Bld 113 (*)    Creatinine, Ser 1.15 (*)    Calcium 8.5 (*)    Albumin 3.4 (*)    ALT 13 (*)    GFR calc non Af Amer 41 (*)    GFR calc Af Amer 47 (*)    All  other components within normal limits  TROPONIN I  PROTIME-INR  APTT  BRAIN NATRIURETIC PEPTIDE   ____________________________________________  EKG   ____________________________________________  RADIOLOGY  ED MD interpretation: Sound she has bilateral clots as noted below comparing reports to seems to be considerably worse than previous blood clots  Official radiology report(s): US Venous Img Lower Bilateral  Result Date: 03/27/2018 CLINICAL DATA:  84 year old female with bilateral lower extremity edema for the past 2 weeks EXAM: BILATERAL LOWER EXTREMITY VENOUS DOPPLER ULTRASOUND TECHNIQUE: Gray-scale sonography with graded compression, as well as color Doppler and duplex ultrasound were performed to evaluate the lower extremity deep venous systems from the level of the common femoral vein and including the common femoral, femoral, profunda femoral, popliteal and calf veins including the posterior tibial, peroneal and gastrocnemius veins when visible. The superficial great saphenous vein was also interrogated. Spectral Doppler was utilized to evaluate flow at rest and with distal augmentation maneuvers in the common femoral, femoral and popliteal veins. COMPARISON:  Prior left lower extremity duplex venous ultrasound 05/03/2017 FINDINGS: RIGHT LOWER EXTREMITY Common Femoral Vein: Abnormal. The vessel  is not compressible. The lumen is expanded and filled with low-level internal echoes. Minimal flow on color Doppler evaluation. Findings are consistent with occlusive DVT. Saphenofemoral Junction: No evidence of thrombus. Normal compressibility and flow on color Doppler imaging. Profunda Femoral Vein: Thrombus extends into the profunda femoral vein. This thrombus is nonocclusive. Femoral Vein: Thrombus extends into the femoral vein throughout the thigh. This is occlusive. Popliteal Vein: Popliteal vein is not well seen. There is evidence of color flow, however the vessel is incompletely  compressible. Suspect nonocclusive thrombus. Calf Veins: Poor visualization of the calf veins. Superficial Great Saphenous Vein: No evidence of thrombus. Normal compressibility. Venous Reflux:  None. Other Findings:  None. LEFT LOWER EXTREMITY Common Femoral Vein: Abnormal. The vessel is only partially compressible and the lumen is expanded with low-level internal echoes. No color flow on color Doppler evaluation consistent with occlusive thrombus. Saphenofemoral Junction: No evidence of thrombus. Normal compressibility and flow on color Doppler imaging. Profunda Femoral Vein: Occlusive thrombus extends into the profunda femoral vein. Femoral Vein: Occlusive thrombus extends into the superficial femoral vein and remains occlusive throughout the thigh. There may be some partial reconstitution of flow in the distal thigh. Popliteal Vein: Partially compressible. Evidence of at least some color flow on color Doppler evaluation. Findings are consistent with nonocclusive thrombus. Calf Veins: Poor visualization of the calf veins. Superficial Great Saphenous Vein: No evidence of thrombus. Normal compressibility. Venous Reflux:  None. Other Findings:  None. IMPRESSION: 1. Positive for extensive bilateral DVT extending from the common femoral vein through the femoral vein and into the popliteal veins bilaterally. These results will be called to the ordering clinician or representative by the Radiologist Assistant, and communication documented in the PACS or zVision Dashboard. Electronically Signed   By: Jacqulynn Cadet M.D.   On: 03/27/2018 16:11    ____________________________________________   PROCEDURES  Procedure(s) performed:   Procedures  Critical Care performed:   ____________________________________________   INITIAL IMPRESSION / ASSESSMENT AND PLAN / ED COURSE  Discussed patient with Dr. do who is this appears to be an acute worsening of her DVTs he feels it would be best to admit her and  possibly even lyse her.  We will do this.         ____________________________________________   FINAL CLINICAL IMPRESSION(S) / ED DIAGNOSES  Final diagnoses:  Acute deep vein thrombosis (DVT) of popliteal vein of both lower extremities (HCC)  DVT, lower extremity, proximal, acute, bilateral [facility])     ED Discharge Orders    None       Note:  This document was prepared using Dragon voice recognition software and may include unintentional dictation errors.    Nena Polio, MD 03/27/18 [phone]

## 2018-03-27 NOTE — Progress Notes (Signed)
Chief Complaint  Patient presents with  . Follow-up   HFU here with daughter  1. C/o b/l leg edema gained wt 22 lbs since last visit poor po Albumin 2.5 4/12 and also getting IVF in hospital She is on lasix 20 mg but getting prn 2. Hypotension on Midodrine 10 mg tid #1 is not side effect of medication off fosinopril due to low BP  3. GERD she wants to stop Carafate 4. Trigeminal neuralgia improved since surgery 02/07/18 at [facility] but still intermittently with pain   Review of Systems  Constitutional: Positive for weight loss.  HENT: Positive for hearing loss.   Eyes: Negative for blurred vision.  Respiratory: Negative for shortness of breath.   Cardiovascular: Positive for leg swelling. Negative for chest pain.  Gastrointestinal: Negative for abdominal pain.   Past Medical History:  Diagnosis Date  . Acoustic neuroma ([place])   . Allergy   . Asthma   . Bilateral swelling of feet    and legs  . Bladder infection   . CAD (coronary artery disease)   . Cataract   . Change in voice   . Compression fracture of body of thoracic vertebra (HCC)    T12 09/18/15 MRI s/p fall   . Constipation   . COPD (chronic obstructive pulmonary disease) ([place])    previous CXR with chronic interstitial lung dz   . CVA (cerebral vascular accident) ([place])   . Depression   . Diabetes ([place])    with neuropathy  . Diabetes mellitus, type 2 ([place])   . Diarrhea   . Double vision   . DVT (deep venous thrombosis) ([place])    right leg 10/2015 was on coumadin off as of 2017/2018 ; s/p IVC filter  . Enuresis   . Eye pain, right   . Fall   . Fatty liver    09/15/15 also mildly dilated pancreatitic duct rec MRCP small sub cm cyst hemangioma speeln mild right hydronephrorossi and prox. hydroureter, kidney stones, mild scarring kidneys  . Female stress incontinence   . Flank pain   . GERD (gastroesophageal reflux disease)    with small hiatal hernia   . Hard of hearing   . Heart disease   . History of kidney problems    . Hyperlipidemia    mixed  . Hypertension   . Hypothyroidism, postsurgical   . Impaired mobility and ADLs    uses rolling walker has caretaker 24/7 at home  . Leg edema   . Mixed incontinence urge and stress (female)(female)   . Neuropathy   . Osteoarthritis    DDD spine   . Osteoporosis with fracture    T12 compression fracture  . Photophobia   . Pulmonary embolism ([place])    10/2015 off coumadin as of 04/2016  . Pulmonary HTN (HCC)    mild pulm HTN, echo 10/09/15 EF 93-71%IRCVE 1 dd, RV systolic pressure increased   . Recurrent UTI   . Sinus pressure   . Skin cancer    BCC jawline and scalp   . Thyroid disease    follows [facility]  . TIA (transient ischemic attack)    MRI 2009/2010 neg stroke   . Trigeminal neuralgia    Dr. Tomi Bamberger s/p gamma knife x 2, on Tegretol since 2011/2012 no increase in dose >200 mg bid rec per family per neurology   . Urinary frequency   . Urinary, incontinence, stress female    Dr Erlene Quan urology    Past Surgical History:  Procedure Laterality Date  . APPENDECTOMY     as a child, open  . BRAIN SURGERY     schwnnoma removal 1996   . brain tumor surgery    . BREAST SURGERY     breast bx  . CATARACT EXTRACTION    . CHOLECYSTECTOMY    . EYE SURGERY     cataract  . IVC FILTER PLACEMENT ([facility] HX)     Dr. Lucky Cowboy 10/2015   . LAPAROSCOPIC TUBAL LIGATION    . MOHS SURGERY     scalp 04/2014   . PERIPHERAL VASCULAR CATHETERIZATION N/A 10/11/2015   Procedure: IVC Filter Insertion;  Surgeon: Algernon Huxley, MD;  Location: [facility];  Service: Cardiovascular;  Laterality: N/A;  . PUBOVAGINAL SLING    . THROAT SURGERY    . THYROID SURGERY     tumor around vocal cords   . TOOTH EXTRACTION     winter 2018   . TOTAL THYROIDECTOMY  1976   Family History  Problem Relation Age of Onset  . Heart disease Mother   . Diabetes Father   . Cancer Daughter        breast ca x 2 s/p mastectomy    Social History   Socioeconomic History  .  Marital status: Widowed    Spouse name: Not on file  . Number of children: 5  . Years of education: Not on file  . Highest education level: Not on file  Occupational History  . Not on file  Social Needs  . Financial resource strain: Not on file  . Food insecurity:    Worry: Not on file    Inability: Not on file  . Transportation needs:    Medical: Not on file    Non-medical: Not on file  Tobacco Use  . Smoking status: Former Smoker    Types: Cigarettes    Last attempt to quit: 09/20/1995    Years since quitting: 22.5  . Smokeless tobacco: Never Used  . Tobacco comment: quit 1996 smoked 20 years max 8 cig qd   Substance and Sexual Activity  . Alcohol use: No  . Drug use: No  . Sexual activity: Not on file  Lifestyle  . Physical activity:    Days per week: Not on file    Minutes per session: Not on file  . Stress: Not on file  Relationships  . Social connections:    Talks on phone: Not on file    Gets together: Not on file    Attends religious service: Not on file    Active member of club or organization: Not on file    Attends meetings of clubs or organizations: Not on file    Relationship status: Not on file  . Intimate partner violence:    Fear of current or ex partner: Not on file    Emotionally abused: Not on file    Physically abused: Not on file    Forced sexual activity: Not on file  Other Topics Concern  . Not on file  Social History Narrative  . Not on file   Current Meds  Medication Sig  . acetaminophen (TYLENOL) 325 MG tablet Take 650 mg by mouth every 6 (six) hours as needed.  . Amino Acids-Protein Hydrolys (FEEDING SUPPLEMENT, PRO-STAT SUGAR FREE 64,) LIQD Take 30 mLs by mouth 2 (two) times daily between meals.  Marland Kitchen aspirin EC 81 MG tablet Take 81 mg by mouth daily.  Marland Kitchen atorvastatin (LIPITOR) 40 MG tablet  Take 40 mg by mouth daily at 6 PM.   . bisacodyl (DULCOLAX) 5 MG EC tablet Take 1 tablet (5 mg total) by mouth daily as needed for moderate  constipation.  . budesonide-formoterol (SYMBICORT) 160-4.5 MCG/ACT inhaler Inhale 2 puffs into the lungs 2 (two) times daily.  . Calcium Carbonate-Vitamin D (CALCIUM 600+D) 600-400 MG-UNIT tablet Take 1 tablet by mouth daily.   . carbamazepine (TEGRETOL) 200 MG tablet Take 1 tablet (200 mg total) by mouth 2 (two) times daily.  Marland Kitchen docusate sodium (COLACE) 100 MG capsule Take 1 capsule (100 mg total) by mouth 2 (two) times daily.  . fluticasone (FLONASE) 50 MCG/ACT nasal spray Place 1 spray into both nostrils daily.   . furosemide (LASIX) 40 MG tablet Take 1 tablet (40 mg total) by mouth daily.  Marland Kitchen gabapentin (NEURONTIN) 100 MG capsule   . guaiFENesin (MUCINEX) 600 MG 12 hr tablet Take 600 mg by mouth daily.   Marland Kitchen HYDROcodone-acetaminophen (NORCO/VICODIN) 5-325 MG tablet Take 1 tablet by mouth every 6 (six) hours as needed for moderate pain or severe pain.  Marland Kitchen insulin regular (NOVOLIN R,HUMULIN R) 100 units/mL injection Inject 0-12 Units into the skin 3 (three) times daily before meals. 100 unit/mL; amt: Per Sliding Scale; If Blood Sugar is 176 to 250, give 3 Units.If Blood Sugar is 251 to 325, give 6 Units.If Blood Sugar is 326 to 450, give 10 Units. If Blood Sugar is greater than 450, give 12 Units and recheck in 1 hour. Notify MD/NP if >450 after recheck. Check blood sugar AC & HS  . levothyroxine (SYNTHROID, LEVOTHROID) 200 MCG tablet Take 200 mcg by mouth daily before breakfast. Except on Sunday. Do not take with other medications  . magnesium hydroxide (MILK OF MAGNESIA) 400 MG/5ML suspension Take 30 mLs by mouth every 4 (four) hours as needed. Constipation/ no BM for 2 days  . metFORMIN (GLUCOPHAGE-XR) 500 MG 24 hr tablet Take 500 mg by mouth daily with breakfast.  . midodrine (PROAMATINE) 10 MG tablet Take 1 tablet (10 mg total) by mouth 3 (three) times daily with meals.  . Multiple Vitamin (MULTIVITAMIN WITH MINERALS) TABS tablet Take 1 tablet by mouth daily.  . Nutritional Supplements (ENSURE  ENLIVE PO) Take 1 Bottle by mouth 2 (two) times daily.  . pantoprazole (PROTONIX) 40 MG tablet Take 1 tablet (40 mg total) by mouth daily.  Vladimir Faster Glycol-Propyl Glycol 0.4-0.3 % SOLN Place 1 drop into both eyes at bedtime.   . polyethylene glycol powder (GLYCOLAX/MIRALAX) powder Take 17 g by mouth daily as needed for mild constipation.   Marland Kitchen tiotropium (SPIRIVA) 18 MCG inhalation capsule Place 1 capsule (18 mcg total) into inhaler and inhale daily.  . traZODone (DESYREL) 50 MG tablet Take 0.5 tablets (25 mg total) by mouth at bedtime as needed for sleep.  . [DISCONTINUED] furosemide (LASIX) 20 MG tablet Take 20 mg by mouth daily.  . [DISCONTINUED] sucralfate (CARAFATE) 1 GM/10ML suspension Take 1 g by mouth 4 (four) times daily -  with meals and at bedtime.   Allergies  Allergen Reactions  . Penicillins Shortness Of Breath, Rash and Other (See Comments)    Has patient had a PCN reaction causing immediate rash, facial/tongue/throat swelling, SOB or lightheadedness with hypotension: Yes Has patient had a PCN reaction causing severe rash involving mucus membranes or skin necrosis: No Has patient had a PCN reaction that required hospitalization No Has patient had a PCN reaction occurring within the last 10 years: No If all of  the above answers are "NO", then may proceed with Cephalosporin use.  . Sulfa Antibiotics Shortness Of Breath, Rash and Other (See Comments)  . Amitiza [Lubiprostone]     N/v/d  . Aspirin Other (See Comments)    Reaction:  Unknown  Other reaction(s): Bleeding (intolerance) Per patient " causes nose to bleed" Can take 81 mg daily without any complications Other reaction(s): "bloody nose"   . Penicillin G Other (See Comments)   Recent Results (from the past 2160 hour(s))  Comprehensive metabolic panel     Status: Abnormal   Collection Time: 03/03/18  4:30 PM  Result Value Ref Range   Sodium 140 135 - 145 mmol/L   Potassium 4.5 3.5 - 5.1 mmol/L   Chloride 104 101  - 111 mmol/L   CO2 26 22 - 32 mmol/L   Glucose, Bld 135 (H) 65 - 99 mg/dL   BUN 20 6 - 20 mg/dL   Creatinine, Ser 1.16 (H) 0.44 - 1.00 mg/dL   Calcium 8.8 (L) 8.9 - 10.3 mg/dL   Total Protein 7.1 6.5 - 8.1 g/dL   Albumin 3.7 3.5 - 5.0 g/dL   AST 26 15 - 41 U/L   ALT 19 14 - 54 U/L   Alkaline Phosphatase 65 38 - 126 U/L   Total Bilirubin 0.4 0.3 - 1.2 mg/dL   GFR calc non Af Amer 40 (L) >60 mL/min   GFR calc Af Amer 47 (L) >60 mL/min    Comment: (NOTE) The eGFR has been calculated using the CKD EPI equation. This calculation has not been validated in all clinical situations. eGFR's persistently <60 mL/min signify possible Chronic Kidney Disease.    Anion gap 10 5 - 15    Comment: Performed at [facility], [address] [place], [place] [address]  CBC with Differential     Status: None   Collection Time: 03/03/18  4:30 PM  Result Value Ref Range   WBC 7.3 3.6 - 11.0 K/uL   RBC 4.46 3.80 - 5.20 MIL/uL   Hemoglobin 13.8 12.0 - 16.0 g/dL   HCT 42.7 35.0 - 47.0 %   MCV 95.7 80.0 - 100.0 fL   MCH 30.9 26.0 - 34.0 pg   MCHC 32.3 32.0 - 36.0 g/dL   RDW 14.2 11.5 - 14.5 %   Platelets 198 150 - 440 K/uL   Neutrophils Relative % 71 %   Neutro Abs 5.2 1.4 - 6.5 K/uL   Lymphocytes Relative 19 %   Lymphs Abs 1.4 1.0 - 3.6 K/uL   Monocytes Relative 8 %   Monocytes Absolute 0.6 0.2 - 0.9 K/uL   Eosinophils Relative 2 %   Eosinophils Absolute 0.1 0 - 0.7 K/uL   Basophils Relative 0 %   Basophils Absolute 0.0 0 - 0.1 K/uL    Comment: Performed at [facility], [address] [place], [place] [address]  Lipase, blood     Status: None   Collection Time: 03/03/18  4:30 PM  Result Value Ref Range   Lipase 30 11 - 51 U/L    Comment: Performed at [facility], [address] [place], [place] [address]  Urinalysis, Complete w Microscopic     Status: Abnormal   Collection Time: 03/03/18  5:03 PM  Result Value Ref Range   Color, Urine AMBER (A) YELLOW     Comment: BIOCHEMICALS MAY BE AFFECTED BY COLOR   APPearance HAZY (A) CLEAR   Specific Gravity, Urine 1.024 1.005 - 1.030  pH 5.0 5.0 - 8.0   Glucose, UA NEGATIVE NEGATIVE mg/dL   Hgb urine dipstick NEGATIVE NEGATIVE   Bilirubin Urine NEGATIVE NEGATIVE   Ketones, ur NEGATIVE NEGATIVE mg/dL   Protein, ur 30 (A) NEGATIVE mg/dL   Nitrite NEGATIVE NEGATIVE   Leukocytes, UA NEGATIVE NEGATIVE   RBC / HPF 0-5 0 - 5 RBC/hpf   WBC, UA 0-5 0 - 5 WBC/hpf   Bacteria, UA NONE SEEN NONE SEEN   Squamous Epithelial / LPF 0-5 (A) NONE SEEN    Comment: Performed at [facility], [address] [place], [place] [address]  Carbamazepine level, total     Status: None   Collection Time: 03/03/18  5:40 PM  Result Value Ref Range   Carbamazepine Lvl 6.6 4.0 - 12.0 ug/mL    Comment: Performed at [facility], [address] [place], [place] [address]  Carbamazepine level, total     Status: None   Collection Time: 03/04/18  7:55 PM  Result Value Ref Range   Carbamazepine Lvl 8.4 4.0 - 12.0 ug/mL    Comment: Performed at [facility], [address] [place], [place] [address]  Basic metabolic panel     Status: Abnormal   Collection Time: 03/04/18  7:55 PM  Result Value Ref Range   Sodium 138 135 - 145 mmol/L   Potassium 4.3 3.5 - 5.1 mmol/L   Chloride 107 101 - 111 mmol/L   CO2 26 22 - 32 mmol/L   Glucose, Bld 139 (H) 65 - 99 mg/dL   BUN 20 6 - 20 mg/dL   Creatinine, Ser 1.09 (H) 0.44 - 1.00 mg/dL   Calcium 8.1 (L) 8.9 - 10.3 mg/dL   GFR calc non Af Amer 44 (L) >60 mL/min   GFR calc Af Amer 51 (L) >60 mL/min    Comment: (NOTE) The eGFR has been calculated using the CKD EPI equation. This calculation has not been validated in all clinical situations. eGFR's persistently <60 mL/min signify possible Chronic Kidney Disease.    Anion gap 5 5 - 15    Comment: Performed at [facility], [address] [place], [place] [address]  CBC with Differential      Status: Abnormal   Collection Time: 03/04/18  7:55 PM  Result Value Ref Range   WBC 7.5 3.6 - 11.0 K/uL   RBC 4.07 3.80 - 5.20 MIL/uL   Hemoglobin 12.6 12.0 - 16.0 g/dL   HCT 38.7 35.0 - 47.0 %   MCV 95.0 80.0 - 100.0 fL   MCH 31.0 26.0 - 34.0 pg   MCHC 32.7 32.0 - 36.0 g/dL   RDW 14.2 11.5 - 14.5 %   Platelets 126 (L) 150 - 440 K/uL   Neutrophils Relative % 74 %   Neutro Abs 5.5 1.4 - 6.5 K/uL   Lymphocytes Relative 15 %   Lymphs Abs 1.1 1.0 - 3.6 K/uL   Monocytes Relative 10 %   Monocytes Absolute 0.7 0.2 - 0.9 K/uL   Eosinophils Relative 1 %   Eosinophils Absolute 0.1 0 - 0.7 K/uL   Basophils Relative 0 %   Basophils Absolute 0.0 0 - 0.1 K/uL    Comment: Performed at [facility], [address] [place],  [address]  Troponin I     Status: None   Collection Time: 03/04/18  7:55 PM  Result Value Ref Range   Troponin I <0.03 <0.03 ng/mL    Comment: Performed at [facility], [address] [place], [place] [address]  Troponin I     Status: None   Collection Time: 03/05/18  1:27 AM  Result Value Ref Range   Troponin I <0.03 <0.03 ng/mL    Comment: Performed at [facility], [address] [place], [place] [address]  Basic metabolic panel     Status: Abnormal   Collection Time: 03/05/18  1:27 AM  Result Value Ref Range   Sodium 139 135 - 145 mmol/L   Potassium 4.6 3.5 - 5.1 mmol/L   Chloride 108 101 - 111 mmol/L   CO2 25 22 - 32 mmol/L   Glucose, Bld 136 (H) 65 - 99 mg/dL   BUN 20 6 - 20 mg/dL   Creatinine, Ser 0.96 0.44 - 1.00 mg/dL   Calcium 8.3 (L) 8.9 - 10.3 mg/dL   GFR calc non Af Amer 51 (L) >60 mL/min   GFR calc Af Amer 59 (L) >60 mL/min    Comment: (NOTE) The eGFR has been calculated using the CKD EPI equation. This calculation has not been validated in all clinical situations. eGFR's persistently <60 mL/min signify possible Chronic Kidney Disease.    Anion gap 6 5 - 15    Comment: Performed at [facility],  [address] [place], [place] [address]  CBC     Status: Abnormal   Collection Time: 03/05/18  1:27 AM  Result Value Ref Range   WBC 8.3 3.6 - 11.0 K/uL   RBC 4.15 3.80 - 5.20 MIL/uL   Hemoglobin 12.8 12.0 - 16.0 g/dL   HCT 39.2 35.0 - 47.0 %   MCV 94.5 80.0 - 100.0 fL   MCH 30.8 26.0 - 34.0 pg   MCHC 32.6 32.0 - 36.0 g/dL   RDW 14.3 11.5 - 14.5 %   Platelets 127 (L) 150 - 440 K/uL    Comment: Performed at [facility], [address] [place], [place] [address]  Glucose, capillary     Status: Abnormal   Collection Time: 03/05/18  7:28 AM  Result Value Ref Range   Glucose-Capillary 126 (H) 65 - 99 mg/dL  CBC with Differential     Status: Abnormal   Collection Time: 03/05/18  8:30 PM  Result Value Ref Range   WBC 8.5 3.6 - 11.0 K/uL   RBC 4.27 3.80 - 5.20 MIL/uL   Hemoglobin 13.3 12.0 - 16.0 g/dL   HCT 40.5 35.0 - 47.0 %   MCV 94.8 80.0 - 100.0 fL   MCH 31.1 26.0 - 34.0 pg   MCHC 32.8 32.0 - 36.0 g/dL   RDW 14.4 11.5 - 14.5 %   Platelets 108 (L) 150 - 440 K/uL   Neutrophils Relative % 77 %   Neutro Abs 6.6 (H) 1.4 - 6.5 K/uL   Lymphocytes Relative 12 %   Lymphs Abs 1.0 1.0 - 3.6 K/uL   Monocytes Relative 10 %   Monocytes Absolute 0.9 0.2 - 0.9 K/uL   Eosinophils Relative 1 %   Eosinophils Absolute 0.0 0 - 0.7 K/uL   Basophils Relative 0 %   Basophils Absolute 0.0 0 - 0.1 K/uL    Comment: Performed at [facility], [address] [place], [place] [address]  Basic metabolic panel     Status: Abnormal   Collection Time: 03/05/18  8:30 PM  Result Value Ref Range   Sodium 137 135 - 145 mmol/L   Potassium 4.3 3.5 - 5.1 mmol/L   Chloride 107 101 - 111 mmol/L  CO2 23 22 - 32 mmol/L   Glucose, Bld 175 (H) 65 - 99 mg/dL   BUN 23 (H) 6 - 20 mg/dL   Creatinine, Ser 1.14 (H) 0.44 - 1.00 mg/dL   Calcium 7.8 (L) 8.9 - 10.3 mg/dL   GFR calc non Af Amer 41 (L) >60 mL/min   GFR calc Af Amer 48 (L) >60 mL/min    Comment: (NOTE) The eGFR has been calculated  using the CKD EPI equation. This calculation has not been validated in all clinical situations. eGFR's persistently <60 mL/min signify possible Chronic Kidney Disease.    Anion gap 7 5 - 15    Comment: Performed at [facility], [address] [place], [place] [address]  Troponin I     Status: None   Collection Time: 03/05/18  8:30 PM  Result Value Ref Range   Troponin I <0.03 <0.03 ng/mL    Comment: Performed at [facility], [address] [place], [place] [address]  Basic metabolic panel     Status: Abnormal   Collection Time: 03/06/18  3:58 AM  Result Value Ref Range   Sodium 138 135 - 145 mmol/L   Potassium 4.3 3.5 - 5.1 mmol/L   Chloride 109 101 - 111 mmol/L   CO2 23 22 - 32 mmol/L   Glucose, Bld 125 (H) 65 - 99 mg/dL   BUN 20 6 - 20 mg/dL   Creatinine, Ser 0.94 0.44 - 1.00 mg/dL   Calcium 8.0 (L) 8.9 - 10.3 mg/dL   GFR calc non Af Amer 52 (L) >60 mL/min   GFR calc Af Amer >60 >60 mL/min    Comment: (NOTE) The eGFR has been calculated using the CKD EPI equation. This calculation has not been validated in all clinical situations. eGFR's persistently <60 mL/min signify possible Chronic Kidney Disease.    Anion gap 6 5 - 15    Comment: Performed at [facility], [address] [place], [place] [address]  CBC     Status: Abnormal   Collection Time: 03/06/18  3:58 AM  Result Value Ref Range   WBC 7.6 3.6 - 11.0 K/uL   RBC 3.85 3.80 - 5.20 MIL/uL   Hemoglobin 11.8 (L) 12.0 - 16.0 g/dL   HCT 36.3 35.0 - 47.0 %   MCV 94.3 80.0 - 100.0 fL   MCH 30.6 26.0 - 34.0 pg   MCHC 32.4 32.0 - 36.0 g/dL   RDW 14.1 11.5 - 14.5 %   Platelets 101 (L) 150 - 440 K/uL    Comment: Performed at [facility], [address] [place], [place] [address]  Glucose, capillary     Status: Abnormal   Collection Time: 03/06/18  8:07 AM  Result Value Ref Range   Glucose-Capillary 121 (H) 65 - 99 mg/dL   Comment 1 Notify RN   Glucose, capillary     Status:  Abnormal   Collection Time: 03/06/18 11:49 AM  Result Value Ref Range   Glucose-Capillary 146 (H) 65 - 99 mg/dL   Comment 1 Notify RN   Glucose, capillary     Status: Abnormal   Collection Time: 03/06/18  4:40 PM  Result Value Ref Range   Glucose-Capillary 117 (H) 65 - 99 mg/dL   Comment 1 Notify RN   Glucose, capillary     Status: Abnormal   Collection Time: 03/06/18  8:32 PM  Result Value Ref Range   Glucose-Capillary 138 (H) 65 - 99 mg/dL  Glucose, capillary  Status: Abnormal   Collection Time: 03/07/18  8:07 AM  Result Value Ref Range   Glucose-Capillary 126 (H) 65 - 99 mg/dL   Comment 1 Notify RN   Glucose, capillary     Status: Abnormal   Collection Time: 03/07/18 11:38 AM  Result Value Ref Range   Glucose-Capillary 128 (H) 65 - 99 mg/dL   Comment 1 Notify RN   Glucose, capillary     Status: Abnormal   Collection Time: 03/07/18  5:14 PM  Result Value Ref Range   Glucose-Capillary 135 (H) 65 - 99 mg/dL   Comment 1 Notify RN   Glucose, capillary     Status: Abnormal   Collection Time: 03/07/18  9:25 PM  Result Value Ref Range   Glucose-Capillary 137 (H) 65 - 99 mg/dL   Comment 1 Notify RN    Comment 2 Document in Chart   Glucose, capillary     Status: Abnormal   Collection Time: 03/08/18  8:07 AM  Result Value Ref Range   Glucose-Capillary 128 (H) 65 - 99 mg/dL   Comment 1 Notify RN   Glucose, capillary     Status: Abnormal   Collection Time: 03/08/18 12:16 PM  Result Value Ref Range   Glucose-Capillary 137 (H) 65 - 99 mg/dL   Comment 1 Notify RN   CBC with Differential/Platelet     Status: Abnormal   Collection Time: 03/15/18  5:28 AM  Result Value Ref Range   WBC 8.2 3.6 - 11.0 K/uL   RBC 3.39 (L) 3.80 - 5.20 MIL/uL   Hemoglobin 10.8 (L) 12.0 - 16.0 g/dL   HCT 32.2 (L) 35.0 - 47.0 %   MCV 95.0 80.0 - 100.0 fL   MCH 31.9 26.0 - 34.0 pg   MCHC 33.6 32.0 - 36.0 g/dL   RDW 13.8 11.5 - 14.5 %   Platelets 225 150 - 440 K/uL   Neutrophils Relative % 70 %    Lymphocytes Relative 15 %   Monocytes Relative 11 %   Eosinophils Relative 3 %   Basophils Relative 1 %   Neutro Abs 5.8 1.4 - 6.5 K/uL   Lymphs Abs 1.2 1.0 - 3.6 K/uL   Monocytes Absolute 0.9 0.2 - 0.9 K/uL   Eosinophils Absolute 0.2 0 - 0.7 K/uL   Basophils Absolute 0.1 0 - 0.1 K/uL   RBC Morphology MIXED RBC POPULATION     Comment: POLYCHROMASIA PRESENT   WBC Morphology MILD LEFT SHIFT (1-5% METAS, OCC MYELO, OCC BANDS)    Smear Review      PLATELET CLUMPS NOTED ON SMEAR, COUNT APPEARS ADEQUATE    Comment: Performed at [facility], [address] [place], [place] [address]  Comprehensive metabolic panel     Status: Abnormal   Collection Time: 03/15/18  5:28 AM  Result Value Ref Range   Sodium 137 135 - 145 mmol/L   Potassium 4.5 3.5 - 5.1 mmol/L   Chloride 102 101 - 111 mmol/L   CO2 27 22 - 32 mmol/L   Glucose, Bld 105 (H) 65 - 99 mg/dL   BUN 14 6 - 20 mg/dL   Creatinine, Ser 1.03 (H) 0.44 - 1.00 mg/dL   Calcium 8.4 (L) 8.9 - 10.3 mg/dL   Total Protein 6.1 (L) 6.5 - 8.1 g/dL   Albumin 2.5 (L) 3.5 - 5.0 g/dL   AST 29 15 - 41 U/L   ALT 29 14 - 54 U/L   Alkaline Phosphatase 78 38 - 126 U/L  Total Bilirubin 0.4 0.3 - 1.2 mg/dL   GFR calc non Af Amer 47 (L) >60 mL/min   GFR calc Af Amer 54 (L) >60 mL/min    Comment: (NOTE) The eGFR has been calculated using the CKD EPI equation. This calculation has not been validated in all clinical situations. eGFR's persistently <60 mL/min signify possible Chronic Kidney Disease.    Anion gap 8 5 - 15    Comment: Performed at [facility], [address] [place], [place] [address]  Lipid panel     Status: None   Collection Time: 03/15/18  5:28 AM  Result Value Ref Range   Cholesterol 144 0 - 200 mg/dL   Triglycerides 83 <150 mg/dL   HDL 41 >40 mg/dL   Total CHOL/HDL Ratio 3.5 RATIO   VLDL 17 0 - 40 mg/dL   LDL Cholesterol 86 0 - 99 mg/dL    Comment:        Total Cholesterol/HDL:CHD Risk Coronary Heart  Disease Risk Table                     Men   Women  1/2 Average Risk   3.4   3.3  Average Risk       5.0   4.4  2 X Average Risk   9.6   7.1  3 X Average Risk  23.4   11.0        Use the calculated Patient Ratio above and the CHD Risk Table to determine the patient's CHD Risk.        ATP III CLASSIFICATION (LDL):  <100     mg/dL   Optimal  100-129  mg/dL   Near or Above                    Optimal  130-159  mg/dL   Borderline  160-189  mg/dL   High  >190     mg/dL   Very High Performed at [facility], [address] [place], [place] [address]   Magnesium     Status: None   Collection Time: 03/15/18  5:28 AM  Result Value Ref Range   Magnesium 1.8 1.7 - 2.4 mg/dL    Comment: Performed at [facility], [address] [place], [place] [address]  TSH     Status: None   Collection Time: 03/15/18  5:28 AM  Result Value Ref Range   TSH 3.333 0.350 - 4.500 uIU/mL    Comment: Performed by a 3rd Generation assay with a functional sensitivity of <=0.01 uIU/mL. Performed at [facility], [address] [place], [place] [address]   Vitamin B12     Status: None   Collection Time: 03/15/18  5:28 AM  Result Value Ref Range   Vitamin B-12 716 180 - 914 pg/mL    Comment: (NOTE) This assay is not validated for testing neonatal or myeloproliferative syndrome specimens for Vitamin B12 levels. Performed at [facility], [place] [address], [place], [place] [address]   VITAMIN D 25 Hydroxy (Vit-D Deficiency, Fractures)     Status: None   Collection Time: 03/15/18  5:28 AM  Result Value Ref Range   Vit D, 25-Hydroxy 48.1 30.0 - 100.0 ng/mL    Comment: (NOTE) Vitamin D deficiency has been defined by the [place] practice guideline as a level of serum 25-OH vitamin D less than 20 ng/mL (1,2). The Endocrine Society went on  to further define vitamin D insufficiency as a level between 21 and 29 ng/mL (2). 1. IOM  (Institute of Medicine). 2010. Dietary reference   intakes for calcium and D. [place]: The   [employer]. 2. Holick MF, Binkley Osmond, Bischoff-Ferrari HA, et al.   Evaluation, treatment, and prevention of vitamin D   deficiency: an Endocrine Society clinical practice   guideline. JCEM. 2011 Jul; 96(7):1911-30. Performed At: [facility] [place], [place] [id number] Rush Farmer MD [id number] Performed at [facility], [address] [place], [place] [address]    Objective  Body mass index is 34.27 kg/m. Wt Readings from Last 3 Encounters:  03/27/18 218 lb 12.8 oz (99.2 kg)  03/14/18 208 lb 6.4 oz (94.5 kg)  03/08/18 209 lb 9.6 oz (95.1 kg)   Temp Readings from Last 3 Encounters:  03/27/18 98.2 F (36.8 C) (Oral)  03/21/18 98 F (36.7 C)  03/14/18 97.9 F (36.6 C) (Oral)   BP Readings from Last 3 Encounters:  03/27/18 122/86  03/21/18 137/61  03/14/18 136/67   Pulse Readings from Last 3 Encounters:  03/27/18 86  03/21/18 74  03/14/18 67    Physical Exam  Constitutional: She is oriented to person, place, and time. Vital signs are normal. She appears well-developed and well-nourished. She is cooperative.  HENT:  Head: Normocephalic and atraumatic.  Mouth/Throat: Oropharynx is clear and moist and mucous membranes are normal.  Eyes: Pupils are equal, round, and reactive to light. Conjunctivae are normal.  Cardiovascular: Normal rate, regular rhythm and normal heart sounds.  2-3+ leg edema bl  Pulmonary/Chest: Effort normal and breath sounds normal.  Neurological: She is alert and oriented to person, place, and time. Gait normal.  rollator today   Skin: Skin is warm, dry and intact.  Psychiatric: She has a normal mood and affect. Her speech is normal and behavior is normal. Judgment and thought content normal. Cognition and memory are normal.  Nursing note and vitals reviewed.   Assessment   1. B/l leg edema  could be due to overload, r/o DVT this is not side effect of midodrine. Echo 10/2015 with grade 1 DD normal EF, +pulmonary HTN. She is also malnourished Albumin 2.5 on 03/15/18  2. HTN now with hypotension on midodrine BP controlled today  3. GERD 4. Abdominal pain resolved  5. HM 6. Trigeminal neuralgia improved  Plan  1. Stat DVT r/o [place] b/l, echo  Increase lasix to 40 mg daily until f/u  Hold compression stockings for now  Disc premier protein shakes 2. Cont midodrine 10 mg tid  3. Stop carafate qid for now  4. Monitor  5.  Flu had  pna 23 vx had 09/24/06 prevnar had 09/20/16 Tdap 08/04/16 shingrix not had out of stock called pharmacy, ? zostavax if had  Out of age window pap, colonoscopy, mammo   H/o fall 09/2015 and 05/2016  DEXA 01/01/08 osteopenia will make sure on ca 600 bid and vit D 1000 iu qd    6. On tegretol 200 mg bid higher doses causes side effects   Provider: Dr. Olivia Mackie McLean-Scocuzza-Internal Medicine

## 2018-03-28 DIAGNOSIS — I82409 Acute embolism and thrombosis of unspecified deep veins of unspecified lower extremity: Secondary | ICD-10-CM

## 2018-03-28 DIAGNOSIS — I10 Essential (primary) hypertension: Secondary | ICD-10-CM

## 2018-03-28 DIAGNOSIS — M79604 Pain in right leg: Secondary | ICD-10-CM

## 2018-03-28 DIAGNOSIS — M79605 Pain in left leg: Secondary | ICD-10-CM

## 2018-03-28 DIAGNOSIS — E119 Type 2 diabetes mellitus without complications: Secondary | ICD-10-CM

## 2018-03-28 LAB — BASIC METABOLIC PANEL
Anion gap: 7 (ref 5–15)
BUN: 19 mg/dL (ref 6–20)
CO2: 30 mmol/L (ref 22–32)
Calcium: 8 mg/dL — ABNORMAL LOW (ref 8.9–10.3)
Chloride: 102 mmol/L (ref 101–111)
Creatinine, Ser: 1.04 mg/dL — ABNORMAL HIGH (ref 0.44–1.00)
GFR calc Af Amer: 54 mL/min — ABNORMAL LOW (ref >60)
GFR calc non Af Amer: 46 mL/min — ABNORMAL LOW (ref >60)
Glucose, Bld: 104 mg/dL — ABNORMAL HIGH (ref 65–99)
Potassium: 3.2 mmol/L — ABNORMAL LOW (ref 3.5–5.1)
Sodium: 139 mmol/L (ref 135–145)

## 2018-03-28 LAB — CBC
HCT: 33.4 % — ABNORMAL LOW (ref 35.0–47.0)
Hemoglobin: 11.3 g/dL — ABNORMAL LOW (ref 12.0–16.0)
MCH: 31.7 pg (ref 26.0–34.0)
MCHC: 33.8 g/dL (ref 32.0–36.0)
MCV: 93.6 fL (ref 80.0–100.0)
Platelets: 178 10*3/uL (ref 150–440)
RBC: 3.57 MIL/uL — ABNORMAL LOW (ref 3.80–5.20)
RDW: 15 % — ABNORMAL HIGH (ref 11.5–14.5)
WBC: 6.8 10*3/uL (ref 3.6–11.0)

## 2018-03-28 LAB — GLUCOSE, CAPILLARY
Glucose-Capillary: 101 mg/dL — ABNORMAL HIGH (ref 65–99)
Glucose-Capillary: 90 mg/dL (ref 65–99)

## 2018-03-28 LAB — HEPARIN LEVEL (UNFRACTIONATED)
Heparin Unfractionated: 1.28 IU/mL — ABNORMAL HIGH (ref 0.30–0.70)
Heparin Unfractionated: 0.94 IU/mL — ABNORMAL HIGH (ref 0.30–0.70)

## 2018-03-28 MED ORDER — POTASSIUM CHLORIDE CRYS ER 20 MEQ PO TBCR
40.0000 meq | EXTENDED_RELEASE_TABLET | Freq: Once | ORAL | Status: AC
  Administered 2018-03-28: 40 meq via ORAL
  Filled 2018-03-28: qty 2

## 2018-03-28 MED ORDER — HEPARIN (PORCINE) IN NACL 100-0.45 UNIT/ML-% IJ SOLN
900.0000 [IU]/h | INTRAMUSCULAR | Status: DC
  Administered 2018-03-28 (×2): 1200 [IU]/h via INTRAVENOUS
  Administered 2018-03-29 – 2018-03-30 (×2): 850 [IU]/h via INTRAVENOUS
  Filled 2018-03-28 (×4): qty 250

## 2018-03-28 MED ORDER — CIPROFLOXACIN IN D5W 400 MG/200ML IV SOLN
400.0000 mg | INTRAVENOUS | Status: DC
  Administered 2018-03-29: 400 mg via INTRAVENOUS
  Filled 2018-03-28: qty 200

## 2018-03-28 NOTE — Progress Notes (Addendum)
ANTICOAGULATION CONSULT NOTE - Initial Consult  Pharmacy Consult for heparin gtt Indication: DVT  Allergies  Allergen Reactions  . Penicillins Shortness Of Breath, Rash and Other (See Comments)    Has patient had a PCN reaction causing immediate rash, facial/tongue/throat swelling, SOB or lightheadedness with hypotension: Yes Has patient had a PCN reaction causing severe rash involving mucus membranes or skin necrosis: No Has patient had a PCN reaction that required hospitalization No Has patient had a PCN reaction occurring within the last 10 years: No If all of the above answers are "NO", then may proceed with Cephalosporin use.  . Sulfa Antibiotics Shortness Of Breath, Rash and Other (See Comments)  . Amitiza [Lubiprostone]     N/v/d  . Aspirin Other (See Comments)    Reaction:  Unknown  Other reaction(s): Bleeding (intolerance) Per patient " causes nose to bleed" Can take 81 mg daily without any complications Other reaction(s): "bloody nose"   . Penicillin G Other (See Comments)    Has patient had a PCN reaction causing immediate rash, facial/tongue/throat swelling, SOB or lightheadedness with hypotension: No Has patient had a PCN reaction causing severe rash involving mucus membranes or skin necrosis: Unknown Has patient had a PCN reaction that required hospitalization: Unknown Has patient had a PCN reaction occurring within the last 10 years: Unknown If all of the above answers are "NO", then may proceed with Cephalosporin use.    Patient Measurements: Height: 5\' 9"  (175.3 cm) Weight: 207 lb (93.9 kg) IBW/kg (Calculated) : 66.2 Heparin Dosing Weight: 86.1kg  Vital Signs: Temp: 97.9 F (36.6 C) (04/25 1340) Temp Source: Oral (04/25 0604) BP: 127/62 (04/25 1340) Pulse Rate: 62 (04/25 1340)  Labs: Recent Labs    03/27/18 1815 03/28/18 0414 03/28/18 1508  HGB 12.3 11.3*  --   HCT 35.8 33.4*  --   PLT 196 178  --   APTT 27  --   --   LABPROT 13.3  --   --   INR  1.02  --   --   HEPARINUNFRC  --  1.28* 0.94*  CREATININE 1.15* 1.04*  --   TROPONINI <0.03  --   --     Estimated Creatinine Clearance: 44.8 mL/min (A) (by C-G formula based on SCr of 1.04 mg/dL (H)).   Medical History: Past Medical History:  Diagnosis Date  . Acoustic neuroma ([place])   . Allergy   . Asthma   . Bilateral swelling of feet    and legs  . Bladder infection   . CAD (coronary artery disease)   . Cataract   . Change in voice   . Compression fracture of body of thoracic vertebra (HCC)    T12 09/18/15 MRI s/p fall   . Constipation   . COPD (chronic obstructive pulmonary disease) ([place])    previous CXR with chronic interstitial lung dz   . CVA (cerebral vascular accident) ([place])   . Depression   . Diabetes ([place])    with neuropathy  . Diabetes mellitus, type 2 ([place])   . Diarrhea   . Double vision   . DVT (deep venous thrombosis) ([place])    right leg 10/2015 was on coumadin off as of 2017/2018 ; s/p IVC filter  . Enuresis   . Eye pain, right   . Fall   . Fatty liver    09/15/15 also mildly dilated pancreatitic duct rec MRCP small sub cm cyst hemangioma speeln mild right hydronephrorossi and prox. hydroureter, kidney stones, mild scarring kidneys  .  Female stress incontinence   . Flank pain   . GERD (gastroesophageal reflux disease)    with small hiatal hernia   . Hard of hearing   . Heart disease   . History of kidney problems   . Hyperlipidemia    mixed  . Hypertension   . Hypothyroidism, postsurgical   . Impaired mobility and ADLs    uses rolling walker has caretaker 24/7 at home  . Leg edema   . Mixed incontinence urge and stress (female)(female)   . Neuropathy   . Osteoarthritis    DDD spine   . Osteoporosis with fracture    T12 compression fracture  . Photophobia   . Pulmonary embolism ([place])    10/2015 off coumadin as of 04/2016  . Pulmonary HTN (HCC)    mild pulm HTN, echo 10/09/15 EF 06-30%ZSWFU 1 dd, RV systolic pressure increased   . Recurrent  UTI   . Sinus pressure   . Skin cancer    BCC jawline and scalp   . Thyroid disease    follows [facility]  . TIA (transient ischemic attack)    MRI 2009/2010 neg stroke   . Trigeminal neuralgia    Dr. Tomi Bamberger s/p gamma knife x 2, on Tegretol since 2011/2012 no increase in dose >200 mg bid rec per family per neurology   . Urinary frequency   . Urinary, incontinence, stress female    Dr Erlene Quan urology     Medications:  Medications Prior to Admission  Medication Sig Dispense Refill Last Dose  . aspirin EC 81 MG tablet Take 81 mg by mouth daily.   03/27/2018 at Unknown time  . atorvastatin (LIPITOR) 40 MG tablet Take 40 mg by mouth daily at 6 PM.    03/26/2018 at Unknown time  . budesonide-formoterol (SYMBICORT) 160-4.5 MCG/ACT inhaler Inhale 2 puffs into the lungs 2 (two) times daily. 1 Inhaler 2 03/27/2018 at Unknown time  . Calcium Carbonate-Vitamin D (CALCIUM 600+D) 600-400 MG-UNIT tablet Take 1 tablet by mouth daily.    03/27/2018 at Unknown time  . carbamazepine (TEGRETOL) 200 MG tablet Take 1 tablet (200 mg total) by mouth 2 (two) times daily. 40 tablet 0 03/27/2018 at Unknown time  . docusate sodium (COLACE) 100 MG capsule Take 1 capsule (100 mg total) by mouth 2 (two) times daily. 10 capsule 0 03/27/2018 at Unknown time  . fluticasone (FLONASE) 50 MCG/ACT nasal spray Place 1 spray into both nostrils daily.    03/27/2018 at Unknown time  . furosemide (LASIX) 40 MG tablet Take 1 tablet (40 mg total) by mouth daily. 30 tablet 2 03/27/2018 at Unknown time  . gabapentin (NEURONTIN) 100 MG capsule Take 100 mg by mouth 2 (two) times daily.    03/27/2018 at Unknown time  . guaiFENesin (MUCINEX) 600 MG 12 hr tablet Take 600 mg by mouth daily.    03/27/2018 at Unknown time  . HYDROcodone-acetaminophen (NORCO/VICODIN) 5-325 MG tablet Take 1 tablet by mouth every 6 (six) hours as needed for moderate pain or severe pain. 120 tablet 0 03/27/2018 at Unknown time  . insulin regular (NOVOLIN R,HUMULIN  R) 100 units/mL injection Inject 0-12 Units into the skin 3 (three) times daily before meals. 100 unit/mL; amt: Per Sliding Scale; If Blood Sugar is 176 to 250, give 3 Units.If Blood Sugar is 251 to 325, give 6 Units.If Blood Sugar is 326 to 450, give 10 Units. If Blood Sugar is greater than 450, give 12 Units and recheck in 1 hour.  Notify MD/NP if >450 after recheck. Check blood sugar AC & HS   03/27/2018 at Unknown time  . levothyroxine (SYNTHROID, LEVOTHROID) 200 MCG tablet Take 200 mcg by mouth daily before breakfast. Except on Sunday. Do not take with other medications   03/27/2018 at Unknown time  . metFORMIN (GLUCOPHAGE-XR) 500 MG 24 hr tablet Take 500 mg by mouth daily with breakfast.   03/27/2018 at Unknown time  . midodrine (PROAMATINE) 10 MG tablet Take 1 tablet (10 mg total) by mouth 3 (three) times daily with meals. 30 tablet 0 03/27/2018 at Unknown time  . Multiple Vitamin (MULTIVITAMIN WITH MINERALS) TABS tablet Take 1 tablet by mouth daily.   03/27/2018 at Unknown time  . Nutritional Supplements (ENSURE ENLIVE PO) Take 1 Bottle by mouth 2 (two) times daily.   03/27/2018 at PRN  . pantoprazole (PROTONIX) 40 MG tablet Take 1 tablet (40 mg total) by mouth daily. 90 tablet 3 03/27/2018 at Unknown time  . Polyethyl Glycol-Propyl Glycol 0.4-0.3 % SOLN Place 1 drop into both eyes at bedtime.    03/26/2018 at Unknown time  . tiotropium (SPIRIVA) 18 MCG inhalation capsule Place 1 capsule (18 mcg total) into inhaler and inhale daily. 90 capsule 3 03/27/2018 at Unknown time  . traZODone (DESYREL) 50 MG tablet Take 0.5 tablets (25 mg total) by mouth at bedtime as needed for sleep. 20 tablet 0 03/26/2018 at Unknown time  . acetaminophen (TYLENOL) 325 MG tablet Take 650 mg by mouth every 6 (six) hours as needed.   PRN at PRN  . Amino Acids-Protein Hydrolys (FEEDING SUPPLEMENT, PRO-STAT SUGAR FREE 64,) LIQD Take 30 mLs by mouth 2 (two) times daily between meals.   PRN at PRN  . bisacodyl (DULCOLAX) 5 MG EC  tablet Take 1 tablet (5 mg total) by mouth daily as needed for moderate constipation. 30 tablet 0 PRN at PRN  . magnesium hydroxide (MILK OF MAGNESIA) 400 MG/5ML suspension Take 30 mLs by mouth every 4 (four) hours as needed. Constipation/ no BM for 2 days   PRN at PRN  . polyethylene glycol powder (GLYCOLAX/MIRALAX) powder Take 17 g by mouth daily as needed for mild constipation.    PRN at PRN   Scheduled:  . aspirin EC  81 mg Oral Daily  . atorvastatin  40 mg Oral q1800  . calcium-vitamin D  1 tablet Oral Daily  . carbamazepine  200 mg Oral BID  . docusate sodium  100 mg Oral BID  . feeding supplement (PRO-STAT SUGAR FREE 64)  30 mL Oral BID BM  . fluticasone  1 spray Each Nare Daily  . furosemide  40 mg Oral Daily  . gabapentin  100 mg Oral BID  . guaiFENesin  600 mg Oral Daily  . insulin aspart  0-15 Units Subcutaneous TID WC  . insulin aspart  0-5 Units Subcutaneous QHS  . levothyroxine  200 mcg Oral QAC breakfast  . metFORMIN  500 mg Oral Q breakfast  . midodrine  10 mg Oral TID WC  . mometasone-formoterol  2 puff Inhalation BID  . multivitamin with minerals  1 tablet Oral Daily  . pantoprazole  40 mg Oral Daily  . polyvinyl alcohol  1 drop Both Eyes QHS  . sodium chloride flush  3 mL Intravenous Q12H  . tiotropium  18 mcg Inhalation Daily   Infusions:  . sodium chloride    . heparin 1,200 Units/hr (03/28/18 1443)   PRN:  Anti-infectives (From admission, onward)   None  Assessment: 84 year old female with dvt requiring heparin infusion. Dr Cinda Quest gave heparin 4000unit bolus x 1    Goal of Therapy:  Heparin level 0.3-0.7 units/ml Monitor platelets by anticoagulation protocol: Yes   Plan:  Give 4000 units bolus x 1 Start heparin infusion at 1400 units/hr Check anti-Xa level in 8 hours and daily while on heparin Continue to monitor H&H and platelets   04/25 @ 0400 HL 1.28 supratherapeutic. Will hold drip for 1 hour and restart @ 1200 units/hr and will  recheck @ 1500. Hgb down by one unit will recheck w/ am labs.  4/25@1730  HL 0.94 supratherapeutic, will decrease to heparin 1000units/hr and monitor in 8 hours.   Thomasenia Sales, PharmD, MBA, [place] Clinical Pharmacist  03/28/2018

## 2018-03-28 NOTE — Plan of Care (Signed)
Pt continues to be on a heparin drip. Plan is to go for thombectomy tomorrow

## 2018-03-28 NOTE — Clinical Social Work Note (Signed)
CSW attempted to call Lattie Haw from [facility], awaiting for phone call back regarding patient's discharge planning.  CSW to continue to follow patient's progress throughout discharge planning.  Jones Broom. [place], MSW, [place]  03/28/2018 11:34 AM

## 2018-03-28 NOTE — Telephone Encounter (Signed)
I gave the daughter a Rx with this written on it  Did she not give it to [facility] If not she has it  Lasix 20 changed to 40 mg qd  D/c carafate   [place]

## 2018-03-28 NOTE — Progress Notes (Signed)
[place] [facility] [place] NAME: Jessica Erickson    MR#:  [id number]  DATE OF BIRTH:  03/29/1926  SUBJECTIVE:  CHIEF COMPLAINT:   Chief Complaint  Patient presents with  . blood clots    History of pulmonary embolism, came with bilateral lower extremity extensive swelling and noted to have DVT. On heparin IV drip. Denies any complaints currently.  REVIEW OF SYSTEMS:  CONSTITUTIONAL: No fever, fatigue or weakness.  EYES: No blurred or double vision.  EARS, NOSE, AND THROAT: No tinnitus or ear pain.  RESPIRATORY: No cough, shortness of breath, wheezing or hemoptysis.  CARDIOVASCULAR: No chest pain, orthopnea, have bilateral leg  dema.  GASTROINTESTINAL: No nausea, vomiting, diarrhea or abdominal pain.  GENITOURINARY: No dysuria, hematuria.  ENDOCRINE: No polyuria, nocturia,  HEMATOLOGY: No anemia, easy bruising or bleeding SKIN: No rash or lesion. MUSCULOSKELETAL: No joint pain or arthritis.   NEUROLOGIC: No tingling, numbness, weakness.  PSYCHIATRY: No anxiety or depression.   ROS  DRUG ALLERGIES:   Allergies  Allergen Reactions  . Penicillins Shortness Of Breath, Rash and Other (See Comments)    Has patient had a PCN reaction causing immediate rash, facial/tongue/throat swelling, SOB or lightheadedness with hypotension: Yes Has patient had a PCN reaction causing severe rash involving mucus membranes or skin necrosis: No Has patient had a PCN reaction that required hospitalization No Has patient had a PCN reaction occurring within the last 10 years: No If all of the above answers are "NO", then may proceed with Cephalosporin use.  . Sulfa Antibiotics Shortness Of Breath, Rash and Other (See Comments)  . Amitiza [Lubiprostone]     N/v/d  . Aspirin Other (See Comments)    Reaction:  Unknown  Other reaction(s): Bleeding (intolerance) Per patient " causes nose to bleed" Can take 81 mg daily without any complications Other reaction(s):  "bloody nose"   . Penicillin G Other (See Comments)    Has patient had a PCN reaction causing immediate rash, facial/tongue/throat swelling, SOB or lightheadedness with hypotension: No Has patient had a PCN reaction causing severe rash involving mucus membranes or skin necrosis: Unknown Has patient had a PCN reaction that required hospitalization: Unknown Has patient had a PCN reaction occurring within the last 10 years: Unknown If all of the above answers are "NO", then may proceed with Cephalosporin use.    VITALS:  Blood pressure 127/62, pulse 62, temperature 97.9 F (36.6 C), resp. rate 16, height 5\' 9"  (1.753 m), weight 93.9 kg (207 lb), SpO2 91 %.  PHYSICAL EXAMINATION:   GENERAL:  84 y.o.-year-old patient lying in the bed with no acute distress.  EYES: Pupils equal, round, reactive to light and accommodation. No scleral icterus. Extraocular muscles intact.  HEENT: Head atraumatic, normocephalic. Oropharynx and nasopharynx clear.  NECK:  Supple, no jugular venous distention. No thyroid enlargement, no tenderness.  LUNGS: Normal breath sounds bilaterally, no wheezing, rales,rhonchi or crepitation. No use of accessory muscles of respiration.  CARDIOVASCULAR: S1, S2 normal. No murmurs, rubs, or gallops.  ABDOMEN: Soft, nontender, nondistended. Bowel sounds present. No organomegaly or mass.  EXTREMITIES: No cyanosis, or clubbing.  Swelling and pain in the lower extremities NEUROLOGIC: Cranial nerves II through XII are intact. Muscle strength 4/5 in all extremities. Sensation intact. Gait not checked.  PSYCHIATRIC: The patient is alert and oriented x 3.  SKIN: No obvious rash, lesion, or ulcer.    Physical Exam LABORATORY PANEL:   CBC Recent Labs  Lab 03/28/18 0414  WBC 6.8  HGB 11.3*  HCT 33.4*  PLT 178   ------------------------------------------------------------------------------------------------------------------  Chemistries  Recent Labs  Lab 03/27/18 1815  03/28/18 0414  NA 139 139  K 4.0 3.2*  CL 100* 102  CO2 32 30  GLUCOSE 113* 104*  BUN 20 19  CREATININE 1.15* 1.04*  CALCIUM 8.5* 8.0*  AST 25  --   ALT 13*  --   ALKPHOS 81  --   BILITOT 0.3  --    ------------------------------------------------------------------------------------------------------------------  Cardiac Enzymes Recent Labs  Lab 03/27/18 1815  TROPONINI <0.03   ------------------------------------------------------------------------------------------------------------------  RADIOLOGY:  US Venous Img Lower Bilateral  Result Date: 03/27/2018 CLINICAL DATA:  84 year old female with bilateral lower extremity edema for the past 2 weeks EXAM: BILATERAL LOWER EXTREMITY VENOUS DOPPLER ULTRASOUND TECHNIQUE: Gray-scale sonography with graded compression, as well as color Doppler and duplex ultrasound were performed to evaluate the lower extremity deep venous systems from the level of the common femoral vein and including the common femoral, femoral, profunda femoral, popliteal and calf veins including the posterior tibial, peroneal and gastrocnemius veins when visible. The superficial great saphenous vein was also interrogated. Spectral Doppler was utilized to evaluate flow at rest and with distal augmentation maneuvers in the common femoral, femoral and popliteal veins. COMPARISON:  Prior left lower extremity duplex venous ultrasound 05/03/2017 FINDINGS: RIGHT LOWER EXTREMITY Common Femoral Vein: Abnormal. The vessel is not compressible. The lumen is expanded and filled with low-level internal echoes. Minimal flow on color Doppler evaluation. Findings are consistent with occlusive DVT. Saphenofemoral Junction: No evidence of thrombus. Normal compressibility and flow on color Doppler imaging. Profunda Femoral Vein: Thrombus extends into the profunda femoral vein. This thrombus is nonocclusive. Femoral Vein: Thrombus extends into the femoral vein throughout the thigh. This is  occlusive. Popliteal Vein: Popliteal vein is not well seen. There is evidence of color flow, however the vessel is incompletely compressible. Suspect nonocclusive thrombus. Calf Veins: Poor visualization of the calf veins. Superficial Great Saphenous Vein: No evidence of thrombus. Normal compressibility. Venous Reflux:  None. Other Findings:  None. LEFT LOWER EXTREMITY Common Femoral Vein: Abnormal. The vessel is only partially compressible and the lumen is expanded with low-level internal echoes. No color flow on color Doppler evaluation consistent with occlusive thrombus. Saphenofemoral Junction: No evidence of thrombus. Normal compressibility and flow on color Doppler imaging. Profunda Femoral Vein: Occlusive thrombus extends into the profunda femoral vein. Femoral Vein: Occlusive thrombus extends into the superficial femoral vein and remains occlusive throughout the thigh. There may be some partial reconstitution of flow in the distal thigh. Popliteal Vein: Partially compressible. Evidence of at least some color flow on color Doppler evaluation. Findings are consistent with nonocclusive thrombus. Calf Veins: Poor visualization of the calf veins. Superficial Great Saphenous Vein: No evidence of thrombus. Normal compressibility. Venous Reflux:  None. Other Findings:  None. IMPRESSION: 1. Positive for extensive bilateral DVT extending from the common femoral vein through the femoral vein and into the popliteal veins bilaterally. These results will be called to the ordering clinician or representative by the Radiologist Assistant, and communication documented in the PACS or zVision Dashboard. Electronically Signed   By: Jacqulynn Cadet M.D.   On: 03/27/2018 16:11    ASSESSMENT AND PLAN:   Active Problems:   DVT (deep vein thrombosis) in pregnancy [facility])   84 year old elderly female patient with history of DVT in the lower extremity, pulmonary embolism, coronary artery disease, CVA, type 2 diabetes  mellitus, hypertension was referred by her physician for  extensive DVT in the lower extremity.  * Bilateral lower extremity DVT IV heparin drip for anticoagulation Vascular surgery consultation for thrombectomy  plan for procedure tomorrow.  * Diabetes mellitus type 2 Resume diabetic medication with sliding scale coverage with insulin  * Coronary artery disease Resume aspirin and statin  * Hypertension Medical management    All the records are reviewed and case discussed with Care Management/Social Workerr. Management plans discussed with the patient, family and they are in agreement.  CODE STATUS: Full.  TOTAL TIME TAKING CARE OF THIS PATIENT: 35 minutes.     POSSIBLE D/C IN 2-3 DAYS, DEPENDING ON CLINICAL CONDITION.   Vaughan Basta M.D on 03/28/2018   Between 7am to 6pm - Pager - [phone]  After 6pm go to [email or web address] - password [email or web address] [facility]  Office  [phone]  CC: Primary care physician; McLean-Scocuzza, Nino Glow, MD  Note: This dictation was prepared with Dragon dictation along with smaller phrase technology. Any transcriptional errors that result from this process are unintentional.

## 2018-03-28 NOTE — Progress Notes (Signed)
ANTICOAGULATION CONSULT NOTE - Initial Consult  Pharmacy Consult for heparin gtt Indication: DVT  Allergies  Allergen Reactions  . Penicillins Shortness Of Breath, Rash and Other (See Comments)    Has patient had a PCN reaction causing immediate rash, facial/tongue/throat swelling, SOB or lightheadedness with hypotension: Yes Has patient had a PCN reaction causing severe rash involving mucus membranes or skin necrosis: No Has patient had a PCN reaction that required hospitalization No Has patient had a PCN reaction occurring within the last 10 years: No If all of the above answers are "NO", then may proceed with Cephalosporin use.  . Sulfa Antibiotics Shortness Of Breath, Rash and Other (See Comments)  . Amitiza [Lubiprostone]     N/v/d  . Aspirin Other (See Comments)    Reaction:  Unknown  Other reaction(s): Bleeding (intolerance) Per patient " causes nose to bleed" Can take 81 mg daily without any complications Other reaction(s): "bloody nose"   . Penicillin G Other (See Comments)    Has patient had a PCN reaction causing immediate rash, facial/tongue/throat swelling, SOB or lightheadedness with hypotension: No Has patient had a PCN reaction causing severe rash involving mucus membranes or skin necrosis: Unknown Has patient had a PCN reaction that required hospitalization: Unknown Has patient had a PCN reaction occurring within the last 10 years: Unknown If all of the above answers are "NO", then may proceed with Cephalosporin use.    Patient Measurements: Height: 5\' 9"  (175.3 cm) Weight: 207 lb (93.9 kg) IBW/kg (Calculated) : 66.2 Heparin Dosing Weight: 86.1kg  Vital Signs: Temp: 98.1 F (36.7 C) (04/24 2318) Temp Source: Oral (04/24 2318) BP: 139/62 (04/24 2318) Pulse Rate: 69 (04/24 2318)  Labs: Recent Labs    03/27/18 1815 03/28/18 0414  HGB 12.3 11.3*  HCT 35.8 33.4*  PLT 196 178  APTT 27  --   LABPROT 13.3  --   INR 1.02  --   HEPARINUNFRC  --  1.28*   CREATININE 1.15* 1.04*  TROPONINI <0.03  --     Estimated Creatinine Clearance: 44.8 mL/min (A) (by C-G formula based on SCr of 1.04 mg/dL (H)).   Medical History: Past Medical History:  Diagnosis Date  . Acoustic neuroma ([place])   . Allergy   . Asthma   . Bilateral swelling of feet    and legs  . Bladder infection   . CAD (coronary artery disease)   . Cataract   . Change in voice   . Compression fracture of body of thoracic vertebra (HCC)    T12 09/18/15 MRI s/p fall   . Constipation   . COPD (chronic obstructive pulmonary disease) ([place])    previous CXR with chronic interstitial lung dz   . CVA (cerebral vascular accident) ([place])   . Depression   . Diabetes ([place])    with neuropathy  . Diabetes mellitus, type 2 ([place])   . Diarrhea   . Double vision   . DVT (deep venous thrombosis) ([place])    right leg 10/2015 was on coumadin off as of 2017/2018 ; s/p IVC filter  . Enuresis   . Eye pain, right   . Fall   . Fatty liver    09/15/15 also mildly dilated pancreatitic duct rec MRCP small sub cm cyst hemangioma speeln mild right hydronephrorossi and prox. hydroureter, kidney stones, mild scarring kidneys  . Female stress incontinence   . Flank pain   . GERD (gastroesophageal reflux disease)    with small hiatal hernia   . Hard  of hearing   . Heart disease   . History of kidney problems   . Hyperlipidemia    mixed  . Hypertension   . Hypothyroidism, postsurgical   . Impaired mobility and ADLs    uses rolling walker has caretaker 24/7 at home  . Leg edema   . Mixed incontinence urge and stress (female)(female)   . Neuropathy   . Osteoarthritis    DDD spine   . Osteoporosis with fracture    T12 compression fracture  . Photophobia   . Pulmonary embolism ([place])    10/2015 off coumadin as of 04/2016  . Pulmonary HTN (HCC)    mild pulm HTN, echo 10/09/15 EF 44-96%PRFFM 1 dd, RV systolic pressure increased   . Recurrent UTI   . Sinus pressure   . Skin cancer    BCC jawline  and scalp   . Thyroid disease    follows [name]  . TIA (transient ischemic attack)    MRI 2009/2010 neg stroke   . Trigeminal neuralgia    Dr. Tomi Bamberger s/p gamma knife x 2, on Tegretol since 2011/2012 no increase in dose >200 mg bid rec per family per neurology   . Urinary frequency   . Urinary, incontinence, stress female    Dr Erlene Quan urology     Medications:  Medications Prior to Admission  Medication Sig Dispense Refill Last Dose  . aspirin EC 81 MG tablet Take 81 mg by mouth daily.   03/27/2018 at Unknown time  . atorvastatin (LIPITOR) 40 MG tablet Take 40 mg by mouth daily at 6 PM.    03/26/2018 at Unknown time  . budesonide-formoterol (SYMBICORT) 160-4.5 MCG/ACT inhaler Inhale 2 puffs into the lungs 2 (two) times daily. 1 Inhaler 2 03/27/2018 at Unknown time  . Calcium Carbonate-Vitamin D (CALCIUM 600+D) 600-400 MG-UNIT tablet Take 1 tablet by mouth daily.    03/27/2018 at Unknown time  . carbamazepine (TEGRETOL) 200 MG tablet Take 1 tablet (200 mg total) by mouth 2 (two) times daily. 40 tablet 0 03/27/2018 at Unknown time  . docusate sodium (COLACE) 100 MG capsule Take 1 capsule (100 mg total) by mouth 2 (two) times daily. 10 capsule 0 03/27/2018 at Unknown time  . fluticasone (FLONASE) 50 MCG/ACT nasal spray Place 1 spray into both nostrils daily.    03/27/2018 at Unknown time  . furosemide (LASIX) 40 MG tablet Take 1 tablet (40 mg total) by mouth daily. 30 tablet 2 03/27/2018 at Unknown time  . gabapentin (NEURONTIN) 100 MG capsule Take 100 mg by mouth 2 (two) times daily.    03/27/2018 at Unknown time  . guaiFENesin (MUCINEX) 600 MG 12 hr tablet Take 600 mg by mouth daily.    03/27/2018 at Unknown time  . HYDROcodone-acetaminophen (NORCO/VICODIN) 5-325 MG tablet Take 1 tablet by mouth every 6 (six) hours as needed for moderate pain or severe pain. 120 tablet 0 03/27/2018 at Unknown time  . insulin regular (NOVOLIN R,HUMULIN R) 100 units/mL injection Inject 0-12 Units into the skin  3 (three) times daily before meals. 100 unit/mL; amt: Per Sliding Scale; If Blood Sugar is 176 to 250, give 3 Units.If Blood Sugar is 251 to 325, give 6 Units.If Blood Sugar is 326 to 450, give 10 Units. If Blood Sugar is greater than 450, give 12 Units and recheck in 1 hour. Notify MD/NP if >450 after recheck. Check blood sugar AC & HS   03/27/2018 at Unknown time  . levothyroxine (SYNTHROID, LEVOTHROID) 200 MCG tablet  Take 200 mcg by mouth daily before breakfast. Except on Sunday. Do not take with other medications   03/27/2018 at Unknown time  . metFORMIN (GLUCOPHAGE-XR) 500 MG 24 hr tablet Take 500 mg by mouth daily with breakfast.   03/27/2018 at Unknown time  . midodrine (PROAMATINE) 10 MG tablet Take 1 tablet (10 mg total) by mouth 3 (three) times daily with meals. 30 tablet 0 03/27/2018 at Unknown time  . Multiple Vitamin (MULTIVITAMIN WITH MINERALS) TABS tablet Take 1 tablet by mouth daily.   03/27/2018 at Unknown time  . Nutritional Supplements (ENSURE ENLIVE PO) Take 1 Bottle by mouth 2 (two) times daily.   03/27/2018 at PRN  . pantoprazole (PROTONIX) 40 MG tablet Take 1 tablet (40 mg total) by mouth daily. 90 tablet 3 03/27/2018 at Unknown time  . Polyethyl Glycol-Propyl Glycol 0.4-0.3 % SOLN Place 1 drop into both eyes at bedtime.    03/26/2018 at Unknown time  . tiotropium (SPIRIVA) 18 MCG inhalation capsule Place 1 capsule (18 mcg total) into inhaler and inhale daily. 90 capsule 3 03/27/2018 at Unknown time  . traZODone (DESYREL) 50 MG tablet Take 0.5 tablets (25 mg total) by mouth at bedtime as needed for sleep. 20 tablet 0 03/26/2018 at Unknown time  . acetaminophen (TYLENOL) 325 MG tablet Take 650 mg by mouth every 6 (six) hours as needed.   PRN at PRN  . Amino Acids-Protein Hydrolys (FEEDING SUPPLEMENT, PRO-STAT SUGAR FREE 64,) LIQD Take 30 mLs by mouth 2 (two) times daily between meals.   PRN at PRN  . bisacodyl (DULCOLAX) 5 MG EC tablet Take 1 tablet (5 mg total) by mouth daily as needed for  moderate constipation. 30 tablet 0 PRN at PRN  . magnesium hydroxide (MILK OF MAGNESIA) 400 MG/5ML suspension Take 30 mLs by mouth every 4 (four) hours as needed. Constipation/ no BM for 2 days   PRN at PRN  . polyethylene glycol powder (GLYCOLAX/MIRALAX) powder Take 17 g by mouth daily as needed for mild constipation.    PRN at PRN   Scheduled:  . aspirin EC  81 mg Oral Daily  . atorvastatin  40 mg Oral q1800  . calcium-vitamin D  1 tablet Oral Daily  . carbamazepine  200 mg Oral BID  . docusate sodium  100 mg Oral BID  . feeding supplement (PRO-STAT SUGAR FREE 64)  30 mL Oral BID BM  . fluticasone  1 spray Each Nare Daily  . furosemide  40 mg Oral Daily  . gabapentin  100 mg Oral BID  . guaiFENesin  600 mg Oral Daily  . insulin aspart  0-15 Units Subcutaneous TID WC  . insulin aspart  0-5 Units Subcutaneous QHS  . levothyroxine  200 mcg Oral QAC breakfast  . metFORMIN  500 mg Oral Q breakfast  . midodrine  10 mg Oral TID WC  . mometasone-formoterol  2 puff Inhalation BID  . multivitamin with minerals  1 tablet Oral Daily  . pantoprazole  40 mg Oral Daily  . polyvinyl alcohol  1 drop Both Eyes QHS  . sodium chloride flush  3 mL Intravenous Q12H  . tiotropium  18 mcg Inhalation Daily   Infusions:  . sodium chloride    . heparin     PRN:  Anti-infectives (From admission, onward)   None      Assessment: 84 year old female with dvt requiring heparin infusion. Dr Cinda Quest gave heparin 4000unit bolus x 1    Goal of Therapy:  Heparin level 0.3-0.7 units/ml Monitor platelets by anticoagulation protocol: Yes   Plan:  Give 4000 units bolus x 1 Start heparin infusion at 1400 units/hr Check anti-Xa level in 8 hours and daily while on heparin Continue to monitor H&H and platelets   04/25 @ 0400 HL 1.28 supratherapeutic. Will hold drip for 1 hour and restart @ 1200 units/hr and will recheck @ 1500. Hgb down by one unit will recheck w/ am labs.  Tobie Lords, PharmD,  BCPS Clinical Pharmacist 03/28/2018

## 2018-03-28 NOTE — Consult Note (Signed)
[facility] Vascular Consult Note  MRN : [id number]  Jessica Erickson is a 84 y.o. (1926/09/21) female who presents with chief complaint of  Chief Complaint  Patient presents with  . blood clots  .  History of Present Illness: I am asked to see the patient by Dr. Estanislado Pandy for evaluation of BLE DVT.  The patient is an 84 year old female who has had about a week of worsening lower extremity pain and swelling.  She has apparently gone from ambulatory to having difficulties with minimal ambulation due to the pain and swelling.  She has a previous history of DVT and pulmonary embolus and had an IVC filter placed about 3 years ago.  Given her advanced age and other comorbidities, her filter remains in place as we discussed removing it with the family and we mutually agreed to leave it in place.  There was no clear inciting event or causative factor that started her symptoms on this occasion.  Both lower extremities are affected.  No ulceration.  No fever or chills.  The swelling is quite prominent bilaterally.  It has gotten a little bit better since she has been in the hospital and her legs have been elevated. As part of her work-up she had an ultrasound showing extensive bilateral lower extremity DVT.  Although no mention of iliac vein DVT was given, I suspect that was present as well as that is not really interrogated in our institution.  Current Facility-Administered Medications  Medication Dose Route Frequency Provider Last Rate Last Dose  . 0.9 %  sodium chloride infusion  250 mL Intravenous PRN Pyreddy, Reatha Harps, MD      . acetaminophen (TYLENOL) tablet 650 mg  650 mg Oral Q6H PRN Pyreddy, Reatha Harps, MD       Or  . acetaminophen (TYLENOL) suppository 650 mg  650 mg Rectal Q6H PRN Pyreddy, Reatha Harps, MD      . aspirin EC tablet 81 mg  81 mg Oral Daily Pyreddy, Pavan, MD   81 mg at 03/28/18 1000  . atorvastatin (LIPITOR) tablet 40 mg  40 mg Oral q1800 Pyreddy, Reatha Harps, MD      .  bisacodyl (DULCOLAX) EC tablet 5 mg  5 mg Oral Daily PRN Pyreddy, Reatha Harps, MD      . calcium-vitamin D (OSCAL WITH D) 500-200 MG-UNIT per tablet 1 tablet  1 tablet Oral Daily Pyreddy, Reatha Harps, MD   1 tablet at 03/28/18 1000  . carbamazepine (TEGRETOL) tablet 200 mg  200 mg Oral BID Vaughan Basta, MD   200 mg at 03/28/18 0814  . docusate sodium (COLACE) capsule 100 mg  100 mg Oral BID Vaughan Basta, MD   100 mg at 03/28/18 1000  . feeding supplement (PRO-STAT SUGAR FREE 64) liquid 30 mL  30 mL Oral BID BM Pyreddy, Pavan, MD      . fluticasone (FLONASE) 50 MCG/ACT nasal spray 1 spray  1 spray Each Nare Daily Pyreddy, Pavan, MD      . furosemide (LASIX) tablet 40 mg  40 mg Oral Daily Pyreddy, Pavan, MD   40 mg at 03/28/18 1000  . gabapentin (NEURONTIN) capsule 100 mg  100 mg Oral BID Vaughan Basta, MD   100 mg at 03/28/18 0959  . guaiFENesin (MUCINEX) 12 hr tablet 600 mg  600 mg Oral Daily Pyreddy, Pavan, MD   600 mg at 03/28/18 0959  . heparin ADULT infusion 100 units/mL (25000 units/237mL sodium chloride 0.45%)  1,200 Units/hr Intravenous Continuous Vaughan Basta, MD 12  mL/hr at 03/28/18 1443 1,200 Units/hr at 03/28/18 1443  . HYDROcodone-acetaminophen (NORCO/VICODIN) 5-325 MG per tablet 1 tablet  1 tablet Oral Q6H PRN Pyreddy, Pavan, MD      . insulin aspart (novoLOG) injection 0-15 Units  0-15 Units Subcutaneous TID WC Pyreddy, Pavan, MD      . insulin aspart (novoLOG) injection 0-5 Units  0-5 Units Subcutaneous QHS Pyreddy, Pavan, MD      . levothyroxine (SYNTHROID, LEVOTHROID) tablet 200 mcg  200 mcg Oral QAC breakfast Saundra Shelling, MD   200 mcg at 03/28/18 0647  . metFORMIN (GLUCOPHAGE-XR) 24 hr tablet 500 mg  500 mg Oral Q breakfast Pyreddy, Reatha Harps, MD   500 mg at 03/28/18 0814  . midodrine (PROAMATINE) tablet 10 mg  10 mg Oral TID WC Pyreddy, Reatha Harps, MD   10 mg at 03/28/18 1204  . mometasone-formoterol (DULERA) 200-5 MCG/ACT inhaler 2 puff  2 puff Inhalation BID  Saundra Shelling, MD   2 puff at 03/28/18 0814  . multivitamin with minerals tablet 1 tablet  1 tablet Oral Daily Saundra Shelling, MD   1 tablet at 03/28/18 0959  . ondansetron (ZOFRAN) tablet 4 mg  4 mg Oral Q6H PRN Pyreddy, Reatha Harps, MD       Or  . ondansetron (ZOFRAN) injection 4 mg  4 mg Intravenous Q6H PRN Pyreddy, Pavan, MD      . pantoprazole (PROTONIX) EC tablet 40 mg  40 mg Oral Daily Pyreddy, Reatha Harps, MD   40 mg at 03/28/18 0959  . polyethylene glycol (MIRALAX / GLYCOLAX) packet 17 g  17 g Oral Daily PRN Vaughan Basta, MD      . polyvinyl alcohol (LIQUIFILM TEARS) 1.4 % ophthalmic solution 1 drop  1 drop Both Eyes QHS Vaughan Basta, MD   1 drop at 03/27/18 2356  . sodium chloride flush (NS) 0.9 % injection 3 mL  3 mL Intravenous Q12H Saundra Shelling, MD   3 mL at 03/27/18 2353  . sodium chloride flush (NS) 0.9 % injection 3 mL  3 mL Intravenous PRN Pyreddy, Reatha Harps, MD      . tiotropium (SPIRIVA) inhalation capsule 18 mcg  18 mcg Inhalation Daily Pyreddy, Pavan, MD      . traZODone (DESYREL) tablet 25 mg  25 mg Oral QHS PRN Saundra Shelling, MD   25 mg at 03/27/18 2351    Past Medical History:  Diagnosis Date  . Acoustic neuroma ([place])   . Allergy   . Asthma   . Bilateral swelling of feet    and legs  . Bladder infection   . CAD (coronary artery disease)   . Cataract   . Change in voice   . Compression fracture of body of thoracic vertebra (HCC)    T12 09/18/15 MRI s/p fall   . Constipation   . COPD (chronic obstructive pulmonary disease) ([place])    previous CXR with chronic interstitial lung dz   . CVA (cerebral vascular accident) ([place])   . Depression   . Diabetes ([place])    with neuropathy  . Diabetes mellitus, type 2 ([place])   . Diarrhea   . Double vision   . DVT (deep venous thrombosis) ([place])    right leg 10/2015 was on coumadin off as of 2017/2018 ; s/p IVC filter  . Enuresis   . Eye pain, right   . Fall   . Fatty liver    09/15/15 also mildly dilated  pancreatitic duct rec MRCP small sub cm cyst hemangioma speeln mild right  hydronephrorossi and prox. hydroureter, kidney stones, mild scarring kidneys  . Female stress incontinence   . Flank pain   . GERD (gastroesophageal reflux disease)    with small hiatal hernia   . Hard of hearing   . Heart disease   . History of kidney problems   . Hyperlipidemia    mixed  . Hypertension   . Hypothyroidism, postsurgical   . Impaired mobility and ADLs    uses rolling walker has caretaker 24/7 at home  . Leg edema   . Mixed incontinence urge and stress (female)(female)   . Neuropathy   . Osteoarthritis    DDD spine   . Osteoporosis with fracture    T12 compression fracture  . Photophobia   . Pulmonary embolism ([place])    10/2015 off coumadin as of 04/2016  . Pulmonary HTN (HCC)    mild pulm HTN, echo 10/09/15 EF 29-93%ZJIRC 1 dd, RV systolic pressure increased   . Recurrent UTI   . Sinus pressure   . Skin cancer    BCC jawline and scalp   . Thyroid disease    follows [facility]  . TIA (transient ischemic attack)    MRI 2009/2010 neg stroke   . Trigeminal neuralgia    Dr. Tomi Bamberger s/p gamma knife x 2, on Tegretol since 2011/2012 no increase in dose >200 mg bid rec per family per neurology   . Urinary frequency   . Urinary, incontinence, stress female    Dr Erlene Quan urology     Past Surgical History:  Procedure Laterality Date  . APPENDECTOMY     as a child, open  . BRAIN SURGERY     schwnnoma removal 1996   . brain tumor surgery    . BREAST SURGERY     breast bx  . CATARACT EXTRACTION    . CHOLECYSTECTOMY    . EYE SURGERY     cataract  . IVC FILTER PLACEMENT ([facility] HX)     Dr. Lucky Cowboy 10/2015   . LAPAROSCOPIC TUBAL LIGATION    . MOHS SURGERY     scalp 04/2014   . PERIPHERAL VASCULAR CATHETERIZATION N/A 10/11/2015   Procedure: IVC Filter Insertion;  Surgeon: Algernon Huxley, MD;  Location: [facility];  Service: Cardiovascular;  Laterality: N/A;  . PUBOVAGINAL SLING    .  THROAT SURGERY    . THYROID SURGERY     tumor around vocal cords   . TOOTH EXTRACTION     winter 2018   . TOTAL THYROIDECTOMY  1976    Social History Social History   Tobacco Use  . Smoking status: Former Smoker    Types: Cigarettes    Last attempt to quit: 09/20/1995    Years since quitting: 22.5  . Smokeless tobacco: Never Used  . Tobacco comment: quit 1996 smoked 20 years max 8 cig qd   Substance Use Topics  . Alcohol use: No  . Drug use: No    Family History Family History  Problem Relation Age of Onset  . Heart disease Mother   . Diabetes Father   . Cancer Daughter        breast ca x 2 s/p mastectomy   No bleeding or clotting disorders  Allergies  Allergen Reactions  . Penicillins Shortness Of Breath, Rash and Other (See Comments)    Has patient had a PCN reaction causing immediate rash, facial/tongue/throat swelling, SOB or lightheadedness with hypotension: Yes Has patient had a PCN reaction causing severe rash  involving mucus membranes or skin necrosis: No Has patient had a PCN reaction that required hospitalization No Has patient had a PCN reaction occurring within the last 10 years: No If all of the above answers are "NO", then may proceed with Cephalosporin use.  . Sulfa Antibiotics Shortness Of Breath, Rash and Other (See Comments)  . Amitiza [Lubiprostone]     N/v/d  . Aspirin Other (See Comments)    Reaction:  Unknown  Other reaction(s): Bleeding (intolerance) Per patient " causes nose to bleed" Can take 81 mg daily without any complications Other reaction(s): "bloody nose"   . Penicillin G Other (See Comments)    Has patient had a PCN reaction causing immediate rash, facial/tongue/throat swelling, SOB or lightheadedness with hypotension: No Has patient had a PCN reaction causing severe rash involving mucus membranes or skin necrosis: Unknown Has patient had a PCN reaction that required hospitalization: Unknown Has patient had a PCN reaction  occurring within the last 10 years: Unknown If all of the above answers are "NO", then may proceed with Cephalosporin use.     REVIEW OF SYSTEMS (Negative unless checked)  Constitutional: [] Weight loss  [] Fever  [] Chills Cardiac: [] Chest pain   [] Chest pressure   [x] Palpitations   [] Shortness of breath when laying flat   [] Shortness of breath at rest   [x] Shortness of breath with exertion. Vascular:  [x] Pain in legs with walking   [x] Pain in legs at rest   [] Pain in legs when laying flat   [] Claudication   [] Pain in feet when walking  [] Pain in feet at rest  [] Pain in feet when laying flat   [x] History of DVT   [] Phlebitis   [x] Swelling in legs   [] Varicose veins   [] Non-healing ulcers Pulmonary:   [] Uses home oxygen   [] Productive cough   [] Hemoptysis   [] Wheeze  [x] COPD   [x] Asthma Neurologic:  [] Dizziness  [] Blackouts   [] Seizures   [x] History of stroke   [x] History of TIA  [] Aphasia   [] Temporary blindness   [] Dysphagia   [] Weakness or numbness in arms   [] Weakness or numbness in legs Musculoskeletal:  [x] Arthritis   [] Joint swelling   [] Joint pain   [] Low back pain Hematologic:  [] Easy bruising  [] Easy bleeding   [] Hypercoagulable state   [x] Anemic  [] Hepatitis Gastrointestinal:  [] Blood in stool   [] Vomiting blood  [x] Gastroesophageal reflux/heartburn   [] Difficulty swallowing. Genitourinary:  [x] Chronic kidney disease   [] Difficult urination  [] Frequent urination  [] Burning with urination   [] Blood in urine Skin:  [] Rashes   [] Ulcers   [] Wounds Psychological:  [] History of anxiety   []  History of major depression.  Physical Examination  Vitals:   03/27/18 2231 03/27/18 2318 03/28/18 0604 03/28/18 1340  BP: (!) 103/53 139/62 (!) 99/58 127/62  Pulse: 69 69 66 62  Resp: 18 (!) 22 (!) 21 16  Temp:  98.1 F (36.7 C) (!) 97.4 F (36.3 C) 97.9 F (36.6 C)  TempSrc:  Oral Oral   SpO2: 95% 99% 93% 91%  Weight:      Height:       Body mass index is 30.57 kg/m. Gen:  WD/WN, NAD.  Appears younger than stated age. Head: [place]/AT, No temporalis wasting.  Ear/Nose/Throat: Hearing markedly diminished, nares w/o erythema or drainage, oropharynx w/o Erythema/Exudate Eyes: Sclera non-icteric, conjunctiva clear Neck: Trachea midline.  No JVD.  Pulmonary:  Good air movement, respirations not labored, equal bilaterally.  Cardiac: irregular Vascular:  Vessel Right Left  Radial Palpable Palpable  PT 1+ Palpable 1+ Palpable  DP Palpable Palpable   Gastrointestinal: soft, non-tender/non-distended.  Musculoskeletal: M/S 5/5 throughout.  Extremities without ischemic changes.  No deformity or atrophy. 2+ BLE edema Neurologic: Sensation grossly intact in extremities.  Symmetrical. Motor exam as listed above. Psychiatric: Judgment and insight seem fair.  Not a great historian Dermatologic: No rashes or ulcers noted.  No cellulitis or open wounds.      CBC Lab Results  Component Value Date   WBC 6.8 03/28/2018   HGB 11.3 (L) 03/28/2018   HCT 33.4 (L) 03/28/2018   MCV 93.6 03/28/2018   PLT 178 03/28/2018    BMET    Component Value Date/Time   NA 139 03/28/2018 0414   K 3.2 (L) 03/28/2018 0414   CL 102 03/28/2018 0414   CO2 30 03/28/2018 0414   GLUCOSE 104 (H) 03/28/2018 0414   BUN 19 03/28/2018 0414   CREATININE 1.04 (H) 03/28/2018 0414   CALCIUM 8.0 (L) 03/28/2018 0414   GFRNONAA 46 (L) 03/28/2018 0414   GFRAA 54 (L) 03/28/2018 0414   Estimated Creatinine Clearance: 44.8 mL/min (A) (by C-G formula based on SCr of 1.04 mg/dL (H)).  COAG Lab Results  Component Value Date   INR 1.02 03/27/2018   INR 2.46 12/07/2015   INR 1.35 11/05/2015    Radiology Dg Pelvis 1-2 Views  Result Date: 03/03/2018 CLINICAL DATA:  Pelvic pain after riding bike. EXAM: PELVIS - 1-2 VIEW COMPARISON:  CT scan of January 10, 2016. FINDINGS: There is no evidence of pelvic fracture or diastasis. No pelvic bone lesions are seen. IMPRESSION: Normal pelvis.  Electronically Signed   By: Marijo Conception, M.D.   On: 03/03/2018 17:27   Ct Head Wo Contrast  Result Date: 03/05/2018 CLINICAL DATA:  Weakness and orthostatic hypotension EXAM: CT HEAD WITHOUT CONTRAST TECHNIQUE: Contiguous axial images were obtained from the base of the skull through the vertex without intravenous contrast. COMPARISON:  08/28/2009 FINDINGS: Brain: No evidence of acute infarction, hemorrhage, hydrocephalus, extra-axial collection or mass lesion/mass effect. Vascular: No hyperdense vessel or unexpected calcification. Skull: Postsurgical defect is noted in the left occipital region Sinuses/Orbits: Within normal limits. Other: None IMPRESSION: Postsurgical changes stable from the prior exam. No acute intracranial abnormality noted. Electronically Signed   By: Inez Catalina M.D.   On: 03/05/2018 20:58   Ct Abdomen Pelvis W Contrast  Result Date: 03/03/2018 CLINICAL DATA:  Pelvic pain.  No reported injury.  Unable to walk. EXAM: CT ABDOMEN AND PELVIS WITH CONTRAST TECHNIQUE: Multidetector CT imaging of the abdomen and pelvis was performed using the standard protocol following bolus administration of intravenous contrast. CONTRAST:  48mL ISOVUE-300 IOPAMIDOL (ISOVUE-300) INJECTION 61% COMPARISON:  Pelvic radiograph from earlier today. 01/10/2016 CT abdomen/pelvis. FINDINGS: Lower chest: No acute abnormality at the lung bases. Nonspecific stable mild patchy subpleural reticulation throughout both lung bases. Coronary atherosclerosis. Hepatobiliary: Normal liver size. No liver mass. Cholecystectomy. Bile ducts are stable and within normal post cholecystectomy limits. Pancreas: Normal, with no mass or duct dilation. Spleen: Normal size. No mass. Adrenals/Urinary Tract: Normal adrenals. No hydronephrosis. Stable mild scarring in the upper right kidney. No renal mass. 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 No left hydronephrosis. Mild fullness of the  right renal collecting system without overt right hydronephrosis. Normal bladder. Stable curvilinear hyperdensity surrounding the urethra, presumably iatrogenic (periurethral collagen injection or mesh). Stomach/Bowel: Small hiatal hernia. Otherwise normal nondistended stomach. Normal caliber small bowel with no  small bowel wall thickening. Appendectomy. Mild sigmoid diverticulosis, with no large bowel wall thickening or pericolonic fat stranding. Fatty stools. Vascular/Lymphatic: Atherosclerotic nonaneurysmal abdominal aorta. Patent portal, splenic, hepatic and renal veins. IVC filter in place below the level of the renal veins. No pathologically enlarged lymph nodes in the abdomen or pelvis. Reproductive: Grossly normal uterus.  No adnexal mass. Other: No pneumoperitoneum, ascites or focal fluid collection. Musculoskeletal: No aggressive appearing focal osseous lesions. Stable post vertebroplasty change in T12 level with stable severe T12 vertebral compression fracture. Severe multilevel thoracolumbar degenerative disc disease. IMPRESSION: 1. No acute abnormality. No evidence of bowel obstruction or acute bowel inflammation. Mild sigmoid diverticulosis, no evidence of acute diverticulitis. 2. Stable periurethral iatrogenic change. No overt hydronephrosis. Redemonstration of partial duplication of the right renal collecting system. 3. Stable chronic severe T12 vertebral compression fracture with associated stable post vertebroplasty change. 4.  Aortic Atherosclerosis (ICD10-I70.0).  Coronary atherosclerosis. 5. Small hiatal hernia. Electronically Signed   By: Ilona Sorrel M.D.   On: 03/03/2018 20:10   US Venous Img Lower Bilateral  Result Date: 03/27/2018 CLINICAL DATA:  84 year old female with bilateral lower extremity edema for the past 2 weeks EXAM: BILATERAL LOWER EXTREMITY VENOUS DOPPLER ULTRASOUND TECHNIQUE: Gray-scale sonography with graded compression, as well as color Doppler and duplex ultrasound were  performed to evaluate the lower extremity deep venous systems from the level of the common femoral vein and including the common femoral, femoral, profunda femoral, popliteal and calf veins including the posterior tibial, peroneal and gastrocnemius veins when visible. The superficial great saphenous vein was also interrogated. Spectral Doppler was utilized to evaluate flow at rest and with distal augmentation maneuvers in the common femoral, femoral and popliteal veins. COMPARISON:  Prior left lower extremity duplex venous ultrasound 05/03/2017 FINDINGS: RIGHT LOWER EXTREMITY Common Femoral Vein: Abnormal. The vessel is not compressible. The lumen is expanded and filled with low-level internal echoes. Minimal flow on color Doppler evaluation. Findings are consistent with occlusive DVT. Saphenofemoral Junction: No evidence of thrombus. Normal compressibility and flow on color Doppler imaging. Profunda Femoral Vein: Thrombus extends into the profunda femoral vein. This thrombus is nonocclusive. Femoral Vein: Thrombus extends into the femoral vein throughout the thigh. This is occlusive. Popliteal Vein: Popliteal vein is not well seen. There is evidence of color flow, however the vessel is incompletely compressible. Suspect nonocclusive thrombus. Calf Veins: Poor visualization of the calf veins. Superficial Great Saphenous Vein: No evidence of thrombus. Normal compressibility. Venous Reflux:  None. Other Findings:  None. LEFT LOWER EXTREMITY Common Femoral Vein: Abnormal. The vessel is only partially compressible and the lumen is expanded with low-level internal echoes. No color flow on color Doppler evaluation consistent with occlusive thrombus. Saphenofemoral Junction: No evidence of thrombus. Normal compressibility and flow on color Doppler imaging. Profunda Femoral Vein: Occlusive thrombus extends into the profunda femoral vein. Femoral Vein: Occlusive thrombus extends into the superficial femoral vein and remains  occlusive throughout the thigh. There may be some partial reconstitution of flow in the distal thigh. Popliteal Vein: Partially compressible. Evidence of at least some color flow on color Doppler evaluation. Findings are consistent with nonocclusive thrombus. Calf Veins: Poor visualization of the calf veins. Superficial Great Saphenous Vein: No evidence of thrombus. Normal compressibility. Venous Reflux:  None. Other Findings:  None. IMPRESSION: 1. Positive for extensive bilateral DVT extending from the common femoral vein through the femoral vein and into the popliteal veins bilaterally. These results will be called to the ordering clinician or representative by the  Psychologist, clinical, and communication documented in the PACS or zVision Dashboard. Electronically Signed   By: Jacqulynn Cadet M.D.   On: 03/27/2018 16:11      Assessment/Plan 1.  Extensive bilateral lower extremity DVT. she had an ultrasound showing extensive bilateral lower extremity DVT.  Although no mention of iliac vein DVT was given, I suspect that was present as well as that is not really interrogated in our institution.  Called her son to discuss the case and given the extensive nature of her DVT, I think consideration for percutaneous thrombectomy and thrombolytic therapy would be a good option.  The amount of clot burden she has now is likely to lead to long-term pain and swelling which could be disabling.  The risk of the procedure is fairly low with some of the newer devices, and I believe this would be of benefit to her.  Her symptoms have been going on reasonably short period of time and she should gain benefit from this.  Risks and benefits the procedure were discussed with her son.  They are going to discuss it amongst the family, but I have tentatively put her on the schedule for tomorrow. 2.  Diabetes.  Stable on outpatient medications and blood glucose control important in reducing the progression of atherosclerotic  disease. Also, involved in wound healing. On appropriate medications. 3.  Heart disease with previous history of pulmonary hypertension from pulmonary embolus.  Reducing her clot burden lowers the risk of further cardiopulmonary complications and pulmonary embolus, although she does still have her IVC filter in place. 4.  Hypertension.  Stable on outpatient medications and blood pressure control important in reducing the progression of atherosclerotic disease. On appropriate oral medications.    Leotis Pain, MD  03/28/2018 3:18 PM    This note was created with Dragon medical transcription system.  Any error is purely unintentional

## 2018-03-29 ENCOUNTER — Encounter
Admission: EM | Disposition: A | Discharge status: Home Health | Payer: Self-pay | Source: Home / Self Care | Attending: Internal Medicine

## 2018-03-29 DIAGNOSIS — I82401 Acute embolism and thrombosis of unspecified deep veins of right lower extremity: Secondary | ICD-10-CM

## 2018-03-29 DIAGNOSIS — I82402 Acute embolism and thrombosis of unspecified deep veins of left lower extremity: Secondary | ICD-10-CM

## 2018-03-29 HISTORY — PX: PERIPHERAL VASCULAR THROMBECTOMY: CATH118306

## 2018-03-29 LAB — HEPARIN LEVEL (UNFRACTIONATED)
Heparin Unfractionated: 0.73 IU/mL — ABNORMAL HIGH (ref 0.30–0.70)
Heparin Unfractionated: 0.75 IU/mL — ABNORMAL HIGH (ref 0.30–0.70)

## 2018-03-29 LAB — GLUCOSE, CAPILLARY
Glucose-Capillary: 96 mg/dL (ref 65–99)
Glucose-Capillary: 105 mg/dL — ABNORMAL HIGH (ref 65–99)
Glucose-Capillary: 97 mg/dL (ref 65–99)
Glucose-Capillary: 105 mg/dL — ABNORMAL HIGH (ref 65–99)
Glucose-Capillary: 87 mg/dL (ref 65–99)
Glucose-Capillary: 86 mg/dL (ref 65–99)
Glucose-Capillary: 77 mg/dL (ref 65–99)
Glucose-Capillary: 98 mg/dL (ref 65–99)

## 2018-03-29 LAB — CBC
HCT: 34.2 % — ABNORMAL LOW (ref 35.0–47.0)
Hemoglobin: 11.5 g/dL — ABNORMAL LOW (ref 12.0–16.0)
MCH: 31.4 pg (ref 26.0–34.0)
MCHC: 33.8 g/dL (ref 32.0–36.0)
MCV: 93 fL (ref 80.0–100.0)
Platelets: 187 10*3/uL (ref 150–440)
RBC: 3.67 MIL/uL — ABNORMAL LOW (ref 3.80–5.20)
RDW: 15 % — ABNORMAL HIGH (ref 11.5–14.5)
WBC: 6.4 10*3/uL (ref 3.6–11.0)

## 2018-03-29 SURGERY — PERIPHERAL VASCULAR THROMBECTOMY
Anesthesia: Moderate Sedation | Laterality: Bilateral

## 2018-03-29 MED ORDER — ALTEPLASE 2 MG IJ SOLR
INTRAMUSCULAR | Status: DC | PRN
  Administered 2018-03-29: 8 mg

## 2018-03-29 MED ORDER — LIDOCAINE HCL (PF) 1 % IJ SOLN
INTRAMUSCULAR | Status: DC | PRN
  Administered 2018-03-29: 10 mL

## 2018-03-29 MED ORDER — MIDAZOLAM HCL 2 MG/2ML IJ SOLN
INTRAMUSCULAR | Status: DC | PRN
  Administered 2018-03-29 (×2): 1 mg via INTRAVENOUS

## 2018-03-29 MED ORDER — HEPARIN SODIUM (PORCINE) 1000 UNIT/ML IJ SOLN
INTRAMUSCULAR | Status: AC
  Filled 2018-03-29: qty 1

## 2018-03-29 MED ORDER — LIDOCAINE-EPINEPHRINE (PF) 1 %-1:200000 IJ SOLN
INTRAMUSCULAR | Status: AC
  Filled 2018-03-29: qty 30

## 2018-03-29 MED ORDER — HEPARIN (PORCINE) IN NACL 1000-0.9 UT/500ML-% IV SOLN
INTRAVENOUS | Status: AC
  Filled 2018-03-29: qty 1000

## 2018-03-29 MED ORDER — FENTANYL CITRATE (PF) 100 MCG/2ML IJ SOLN
INTRAMUSCULAR | Status: AC
  Filled 2018-03-29: qty 2

## 2018-03-29 MED ORDER — SODIUM CHLORIDE 0.9 % IV SOLN
INTRAVENOUS | Status: DC
  Administered 2018-03-29: 12:00:00 via INTRAVENOUS

## 2018-03-29 MED ORDER — IOPAMIDOL (ISOVUE-300) INJECTION 61%
INTRAVENOUS | Status: DC | PRN
  Administered 2018-03-29: 35 mL via INTRAVENOUS

## 2018-03-29 MED ORDER — ALTEPLASE 2 MG IJ SOLR
INTRAMUSCULAR | Status: AC
  Filled 2018-03-29: qty 8

## 2018-03-29 MED ORDER — HEPARIN SODIUM (PORCINE) 1000 UNIT/ML IJ SOLN
INTRAMUSCULAR | Status: DC | PRN
  Administered 2018-03-29: 3000 [IU] via INTRAVENOUS

## 2018-03-29 MED ORDER — FENTANYL CITRATE (PF) 100 MCG/2ML IJ SOLN
INTRAMUSCULAR | Status: DC | PRN
  Administered 2018-03-29: 12.5 ug via INTRAVENOUS
  Administered 2018-03-29: 25 ug via INTRAVENOUS
  Administered 2018-03-29: 12.5 ug via INTRAVENOUS

## 2018-03-29 MED ORDER — MIDAZOLAM HCL 5 MG/5ML IJ SOLN
INTRAMUSCULAR | Status: AC
  Filled 2018-03-29: qty 5

## 2018-03-29 SURGICAL SUPPLY — 10 items
BALLN MUSTANG 12X80X75 (BALLOONS) ×2
BALLOON MUSTANG 12X80X75 (BALLOONS) ×1 IMPLANT
CANISTER PENUMBRA ENGINE (MISCELLANEOUS) ×2 IMPLANT
CATH INDIGO D 50CM (CATHETERS) ×2 IMPLANT
COVER PROBE U/S 5X48 (MISCELLANEOUS) ×2 IMPLANT
DEVICE PRESTO INFLATION (MISCELLANEOUS) ×2 IMPLANT
PACK ANGIOGRAPHY (CUSTOM PROCEDURE TRAY) ×2 IMPLANT
SHEATH BRITE TIP 8FRX11 (SHEATH) ×4 IMPLANT
TOWEL OR 17X26 4PK STRL BLUE (TOWEL DISPOSABLE) ×2 IMPLANT
WIRE MAGIC TOR.035 180C (WIRE) ×2 IMPLANT

## 2018-03-29 NOTE — Care Management Important Message (Signed)
Copy of signed IM left in patient's room.    

## 2018-03-29 NOTE — Op Note (Signed)
VEIN AND VASCULAR SURGERY   OPERATIVE NOTE   PRE-OPERATIVE DIAGNOSIS: extensive BLE and IVC DVT  POST-OPERATIVE DIAGNOSIS: same   PROCEDURE: 1. US guidance for vascular access to bilateral femoral veins 2. Catheter placement into IVC from bilateral femoral approaches 3. IVC gram and bilateral iliofemoral venogram 4.  Catheter directed thrombolysis with 8 mg of TPA to the IVC, right iliac veins, and left iliac veins 5. Mechanical thrombectomy to IVC, right iliac veins, and left iliac veins with the penumbra cat D device 6. PTA of right iliac veins with 2 inflations of a 12 mm diameter by 8 cm length angioplasty balloon 7. PTA of left iliac veins with 2 inflations of a 12 mm diameter by 8 cm length angioplasty balloon   SURGEON: Leotis Pain, MD  ASSISTANT(S): none  ANESTHESIA: local with moderate conscious sedation for 30 minutes using 2 mg of Versed and 50 Mcg of Fentanyl  ESTIMATED BLOOD LOSS: 600 cc  FINDING(S): 1. Extensive bilateral iliac vein thrombosis and IVC thrombosis all the way up to the previous IVC filter  SPECIMEN(S): none  INDICATIONS:  Patient is a 84 y.o. female who presents with bilateral extensive DVTs extending all the way up to her previously placed filter. Patient has marked leg swelling and pain. Venous intervention is performed to reduce the symtpoms and avoid long term postphlebitic symptoms.   DESCRIPTION: After obtaining full informed written consent, the patient was brought back to the vascular suite and placed supine upon the table.Moderate conscious sedation was administered during a face to face encounter with the patient throughout the procedure with my supervision of the RN administering medicines and monitoring the patient's vital signs, pulse oximetry, telemetry and mental status throughout from the start of the procedure until the patient was taken to the recovery room. After obtaining adequate anesthesia, the  patient was prepped and draped in the standard fashion. Both femoral veins were then accessed under direct ultrasound guidance without difficulty with a Seldinger needle and permanent images were recorded.  Thrombus was present in both common femoral veins.  Imaging demonstrated extensive thrombosis of the iliac veins all the way up to the IVC with minimal forward flow through both sheaths evaluating the femoral veins, iliac veins, and IVC.  I then instilled 8 mg of TPA with the penumbra CAT D device after 8 French sheaths were placed bilaterally.  This was instilled in the right iliac vein, left iliac veins, and inferior vena cava.  After this was allowed to dwell for several minutes, I started on the right side using the mechanical thrombectomy device.  The penumbra cath D catheter was used with multiple passes made with a large volume of thrombus returned.  At this point, we restored patency to the IVC in the right iliac veins had forward flow but there was still thrombus creating greater than 50% stenosis in both the common and external iliac veins.  2 inflations with a 12 mm diameter by 8 cm length angioplasty balloon from 8 to 10 atm for 1 minute were performed with less than 30% residual stenosis and marked improvement in forward flow.  I then turned my attention to the left.  Similarly, the penumbra CAT D catheter was used to perform mechanical thrombectomy in the left iliac veins and again up into the IVC.  Several passes were made and again large volumes of thrombus were returned.  There was a significant narrowing in the left common iliac vein that appeared to be separate from thrombus and  residual thrombus in the left external iliac vein so I elected to perform angioplasty.  Again, the 12 mm diameter by 8 cm length angioplasty balloon was selected and inflated in the external and common iliac veins.  These inflations were also about 8 atm for 1 minute.  Completion imaging showed only about a 20%  residual stenosis in the left iliac venous system with improvement in forward flow.  I then elected to terminate the procedure. The sheath was removed and a dressing was placed. She was taken to the recovery room in stable condition having tolerated the procedure well.   COMPLICATIONS: None  CONDITION: Stable  Leotis Pain 03/29/2018 1:51 PM

## 2018-03-29 NOTE — Progress Notes (Signed)
ANTICOAGULATION CONSULT NOTE - Initial Consult  Pharmacy Consult for heparin gtt Indication: DVT  Allergies  Allergen Reactions  . Penicillins Shortness Of Breath, Rash and Other (See Comments)    Has patient had a PCN reaction causing immediate rash, facial/tongue/throat swelling, SOB or lightheadedness with hypotension: Yes Has patient had a PCN reaction causing severe rash involving mucus membranes or skin necrosis: No Has patient had a PCN reaction that required hospitalization No Has patient had a PCN reaction occurring within the last 10 years: No If all of the above answers are "NO", then may proceed with Cephalosporin use.  . Sulfa Antibiotics Shortness Of Breath, Rash and Other (See Comments)  . Amitiza [Lubiprostone]     N/v/d  . Aspirin Other (See Comments)    Reaction:  Unknown  Other reaction(s): Bleeding (intolerance) Per patient " causes nose to bleed" Can take 81 mg daily without any complications Other reaction(s): "bloody nose"   . Penicillin G Other (See Comments)    Has patient had a PCN reaction causing immediate rash, facial/tongue/throat swelling, SOB or lightheadedness with hypotension: No Has patient had a PCN reaction causing severe rash involving mucus membranes or skin necrosis: Unknown Has patient had a PCN reaction that required hospitalization: Unknown Has patient had a PCN reaction occurring within the last 10 years: Unknown If all of the above answers are "NO", then may proceed with Cephalosporin use.    Patient Measurements: Height: 5\' 9"  (175.3 cm) Weight: 207 lb (93.9 kg) IBW/kg (Calculated) : 66.2 Heparin Dosing Weight: 86.1kg  Vital Signs: Temp: 98.5 F (36.9 C) (04/26 1638) Temp Source: Oral (04/26 1638) BP: 127/77 (04/26 1638) Pulse Rate: 53 (04/26 1638)  Labs: Recent Labs    03/27/18 1815  03/28/18 0414 03/28/18 1508 03/29/18 0118 03/29/18 0501  HGB 12.3  --  11.3*  --  11.5*  --   HCT 35.8  --  33.4*  --  34.2*  --   PLT  196  --  178  --  187  --   APTT 27  --   --   --   --   --   LABPROT 13.3  --   --   --   --   --   INR 1.02  --   --   --   --   --   HEPARINUNFRC  --    < > 1.28* 0.94* 0.73* 0.75*  CREATININE 1.15*  --  1.04*  --   --   --   TROPONINI <0.03  --   --   --   --   --    < > = values in this interval not displayed.    Estimated Creatinine Clearance: 44.8 mL/min (A) (by C-G formula based on SCr of 1.04 mg/dL (H)).   Medical History: Past Medical History:  Diagnosis Date  . Acoustic neuroma ([place])   . Allergy   . Asthma   . Bilateral swelling of feet    and legs  . Bladder infection   . CAD (coronary artery disease)   . Cataract   . Change in voice   . Compression fracture of body of thoracic vertebra (HCC)    T12 09/18/15 MRI s/p fall   . Constipation   . COPD (chronic obstructive pulmonary disease) ([place])    previous CXR with chronic interstitial lung dz   . CVA (cerebral vascular accident) ([place])   . Depression   . Diabetes ([place])    with neuropathy  .  Diabetes mellitus, type 2 ([place])   . Diarrhea   . Double vision   . DVT (deep venous thrombosis) ([place])    right leg 10/2015 was on coumadin off as of 2017/2018 ; s/p IVC filter  . Enuresis   . Eye pain, right   . Fall   . Fatty liver    09/15/15 also mildly dilated pancreatitic duct rec MRCP small sub cm cyst hemangioma speeln mild right hydronephrorossi and prox. hydroureter, kidney stones, mild scarring kidneys  . Female stress incontinence   . Flank pain   . GERD (gastroesophageal reflux disease)    with small hiatal hernia   . Hard of hearing   . Heart disease   . History of kidney problems   . Hyperlipidemia    mixed  . Hypertension   . Hypothyroidism, postsurgical   . Impaired mobility and ADLs    uses rolling walker has caretaker 24/7 at home  . Leg edema   . Mixed incontinence urge and stress (female)(female)   . Neuropathy   . Osteoarthritis    DDD spine   . Osteoporosis with fracture    T12 compression  fracture  . Photophobia   . Pulmonary embolism ([place])    10/2015 off coumadin as of 04/2016  . Pulmonary HTN (HCC)    mild pulm HTN, echo 10/09/15 EF 46-65%LDJTT 1 dd, RV systolic pressure increased   . Recurrent UTI   . Sinus pressure   . Skin cancer    BCC jawline and scalp   . Thyroid disease    follows [facility]  . TIA (transient ischemic attack)    MRI 2009/2010 neg stroke   . Trigeminal neuralgia    Dr. Tomi Bamberger s/p gamma knife x 2, on Tegretol since 2011/2012 no increase in dose >200 mg bid rec per family per neurology   . Urinary frequency   . Urinary, incontinence, stress female    Dr Erlene Quan urology     Medications:  Medications Prior to Admission  Medication Sig Dispense Refill Last Dose  . aspirin EC 81 MG tablet Take 81 mg by mouth daily.   03/27/2018 at Unknown time  . atorvastatin (LIPITOR) 40 MG tablet Take 40 mg by mouth daily at 6 PM.    03/26/2018 at Unknown time  . budesonide-formoterol (SYMBICORT) 160-4.5 MCG/ACT inhaler Inhale 2 puffs into the lungs 2 (two) times daily. 1 Inhaler 2 03/27/2018 at Unknown time  . Calcium Carbonate-Vitamin D (CALCIUM 600+D) 600-400 MG-UNIT tablet Take 1 tablet by mouth daily.    03/27/2018 at Unknown time  . carbamazepine (TEGRETOL) 200 MG tablet Take 1 tablet (200 mg total) by mouth 2 (two) times daily. 40 tablet 0 03/27/2018 at Unknown time  . docusate sodium (COLACE) 100 MG capsule Take 1 capsule (100 mg total) by mouth 2 (two) times daily. 10 capsule 0 03/27/2018 at Unknown time  . fluticasone (FLONASE) 50 MCG/ACT nasal spray Place 1 spray into both nostrils daily.    03/27/2018 at Unknown time  . furosemide (LASIX) 40 MG tablet Take 1 tablet (40 mg total) by mouth daily. 30 tablet 2 03/27/2018 at Unknown time  . gabapentin (NEURONTIN) 100 MG capsule Take 100 mg by mouth 2 (two) times daily.    03/27/2018 at Unknown time  . guaiFENesin (MUCINEX) 600 MG 12 hr tablet Take 600 mg by mouth daily.    03/27/2018 at Unknown time  .  HYDROcodone-acetaminophen (NORCO/VICODIN) 5-325 MG tablet Take 1 tablet by mouth every 6 (six)  hours as needed for moderate pain or severe pain. 120 tablet 0 03/27/2018 at Unknown time  . insulin regular (NOVOLIN R,HUMULIN R) 100 units/mL injection Inject 0-12 Units into the skin 3 (three) times daily before meals. 100 unit/mL; amt: Per Sliding Scale; If Blood Sugar is 176 to 250, give 3 Units.If Blood Sugar is 251 to 325, give 6 Units.If Blood Sugar is 326 to 450, give 10 Units. If Blood Sugar is greater than 450, give 12 Units and recheck in 1 hour. Notify MD/NP if >450 after recheck. Check blood sugar AC & HS   03/27/2018 at Unknown time  . levothyroxine (SYNTHROID, LEVOTHROID) 200 MCG tablet Take 200 mcg by mouth daily before breakfast. Except on Sunday. Do not take with other medications   03/27/2018 at Unknown time  . metFORMIN (GLUCOPHAGE-XR) 500 MG 24 hr tablet Take 500 mg by mouth daily with breakfast.   03/27/2018 at Unknown time  . midodrine (PROAMATINE) 10 MG tablet Take 1 tablet (10 mg total) by mouth 3 (three) times daily with meals. 30 tablet 0 03/27/2018 at Unknown time  . Multiple Vitamin (MULTIVITAMIN WITH MINERALS) TABS tablet Take 1 tablet by mouth daily.   03/27/2018 at Unknown time  . Nutritional Supplements (ENSURE ENLIVE PO) Take 1 Bottle by mouth 2 (two) times daily.   03/27/2018 at PRN  . pantoprazole (PROTONIX) 40 MG tablet Take 1 tablet (40 mg total) by mouth daily. 90 tablet 3 03/27/2018 at Unknown time  . Polyethyl Glycol-Propyl Glycol 0.4-0.3 % SOLN Place 1 drop into both eyes at bedtime.    03/26/2018 at Unknown time  . tiotropium (SPIRIVA) 18 MCG inhalation capsule Place 1 capsule (18 mcg total) into inhaler and inhale daily. 90 capsule 3 03/27/2018 at Unknown time  . traZODone (DESYREL) 50 MG tablet Take 0.5 tablets (25 mg total) by mouth at bedtime as needed for sleep. 20 tablet 0 03/26/2018 at Unknown time  . acetaminophen (TYLENOL) 325 MG tablet Take 650 mg by mouth every 6  (six) hours as needed.   PRN at PRN  . Amino Acids-Protein Hydrolys (FEEDING SUPPLEMENT, PRO-STAT SUGAR FREE 64,) LIQD Take 30 mLs by mouth 2 (two) times daily between meals.   PRN at PRN  . bisacodyl (DULCOLAX) 5 MG EC tablet Take 1 tablet (5 mg total) by mouth daily as needed for moderate constipation. 30 tablet 0 PRN at PRN  . magnesium hydroxide (MILK OF MAGNESIA) 400 MG/5ML suspension Take 30 mLs by mouth every 4 (four) hours as needed. Constipation/ no BM for 2 days   PRN at PRN  . polyethylene glycol powder (GLYCOLAX/MIRALAX) powder Take 17 g by mouth daily as needed for mild constipation.    PRN at PRN   Scheduled:  . aspirin EC  81 mg Oral Daily  . atorvastatin  40 mg Oral q1800  . calcium-vitamin D  1 tablet Oral Daily  . carbamazepine  200 mg Oral BID  . docusate sodium  100 mg Oral BID  . feeding supplement (PRO-STAT SUGAR FREE 64)  30 mL Oral BID BM  . fluticasone  1 spray Each Nare Daily  . gabapentin  100 mg Oral BID  . guaiFENesin  600 mg Oral Daily  . insulin aspart  0-15 Units Subcutaneous TID WC  . insulin aspart  0-5 Units Subcutaneous QHS  . levothyroxine  200 mcg Oral QAC breakfast  . midodrine  10 mg Oral TID WC  . mometasone-formoterol  2 puff Inhalation BID  . multivitamin with minerals  1 tablet Oral Daily  . pantoprazole  40 mg Oral Daily  . polyvinyl alcohol  1 drop Both Eyes QHS  . sodium chloride flush  3 mL Intravenous Q12H  . tiotropium  18 mcg Inhalation Daily   Infusions:  . sodium chloride    . heparin 850 Units/hr (03/29/18 1727)   PRN:  Anti-infectives (From admission, onward)   Start     Dose/Rate Route Frequency Ordered Stop   03/28/18 2139  ciprofloxacin (CIPRO) IVPB 400 mg  Status:  Discontinued     400 mg 200 mL/hr over 60 Minutes Intravenous 60 min pre-op 03/28/18 2139 03/29/18 1616      Assessment: 84 year old female with dvt requiring heparin infusion.  Patient is s/p thrombectomy. Dr. Lucky Cowboy wants to continue dosing heparin with  normal goal and bolus if needed post-thrombectomy.    Goal of Therapy:  Heparin level 0.3-0.7 units/ml Monitor platelets by anticoagulation protocol: Yes   Plan:  Will check a HL 8 hours after infusion resumed.   Ulice Dash, PharmD Clinical Pharmacist  03/29/2018

## 2018-03-29 NOTE — H&P (Signed)
[facility] History & Physical Update  The patient was interviewed and re-examined.  The patient's previous History and Physical has been reviewed and is unchanged.  There is no change in the plan of care. We plan to proceed with the scheduled procedure.  Leotis Pain, MD  03/29/2018, 12:31 PM

## 2018-03-29 NOTE — Progress Notes (Signed)
ANTICOAGULATION CONSULT NOTE - Initial Consult  Pharmacy Consult for heparin gtt Indication: DVT  Allergies  Allergen Reactions  . Penicillins Shortness Of Breath, Rash and Other (See Comments)    Has patient had a PCN reaction causing immediate rash, facial/tongue/throat swelling, SOB or lightheadedness with hypotension: Yes Has patient had a PCN reaction causing severe rash involving mucus membranes or skin necrosis: No Has patient had a PCN reaction that required hospitalization No Has patient had a PCN reaction occurring within the last 10 years: No If all of the above answers are "NO", then may proceed with Cephalosporin use.  . Sulfa Antibiotics Shortness Of Breath, Rash and Other (See Comments)  . Amitiza [Lubiprostone]     N/v/d  . Aspirin Other (See Comments)    Reaction:  Unknown  Other reaction(s): Bleeding (intolerance) Per patient " causes nose to bleed" Can take 81 mg daily without any complications Other reaction(s): "bloody nose"   . Penicillin G Other (See Comments)    Has patient had a PCN reaction causing immediate rash, facial/tongue/throat swelling, SOB or lightheadedness with hypotension: No Has patient had a PCN reaction causing severe rash involving mucus membranes or skin necrosis: Unknown Has patient had a PCN reaction that required hospitalization: Unknown Has patient had a PCN reaction occurring within the last 10 years: Unknown If all of the above answers are "NO", then may proceed with Cephalosporin use.    Patient Measurements: Height: 5\' 9"  (175.3 cm) Weight: 207 lb (93.9 kg) IBW/kg (Calculated) : 66.2 Heparin Dosing Weight: 86.1kg  Vital Signs: Temp: 97.6 F (36.4 C) (04/26 0423) Temp Source: Oral (04/26 0423) BP: 129/67 (04/26 0423) Pulse Rate: 60 (04/26 0423)  Labs: Recent Labs    03/27/18 1815 03/28/18 0414 03/28/18 1508 03/29/18 0118  HGB 12.3 11.3*  --  11.5*  HCT 35.8 33.4*  --  34.2*  PLT 196 178  --  187  APTT 27  --   --    --   LABPROT 13.3  --   --   --   INR 1.02  --   --   --   HEPARINUNFRC  --  1.28* 0.94* 0.73*  CREATININE 1.15* 1.04*  --   --   TROPONINI <0.03  --   --   --     Estimated Creatinine Clearance: 44.8 mL/min (A) (by C-G formula based on SCr of 1.04 mg/dL (H)).   Medical History: Past Medical History:  Diagnosis Date  . Acoustic neuroma ([place])   . Allergy   . Asthma   . Bilateral swelling of feet    and legs  . Bladder infection   . CAD (coronary artery disease)   . Cataract   . Change in voice   . Compression fracture of body of thoracic vertebra (HCC)    T12 09/18/15 MRI s/p fall   . Constipation   . COPD (chronic obstructive pulmonary disease) ([place])    previous CXR with chronic interstitial lung dz   . CVA (cerebral vascular accident) ([place])   . Depression   . Diabetes ([place])    with neuropathy  . Diabetes mellitus, type 2 ([place])   . Diarrhea   . Double vision   . DVT (deep venous thrombosis) ([place])    right leg 10/2015 was on coumadin off as of 2017/2018 ; s/p IVC filter  . Enuresis   . Eye pain, right   . Fall   . Fatty liver    09/15/15 also mildly dilated pancreatitic  duct rec MRCP small sub cm cyst hemangioma speeln mild right hydronephrorossi and prox. hydroureter, kidney stones, mild scarring kidneys  . Female stress incontinence   . Flank pain   . GERD (gastroesophageal reflux disease)    with small hiatal hernia   . Hard of hearing   . Heart disease   . History of kidney problems   . Hyperlipidemia    mixed  . Hypertension   . Hypothyroidism, postsurgical   . Impaired mobility and ADLs    uses rolling walker has caretaker 24/7 at home  . Leg edema   . Mixed incontinence urge and stress (female)(female)   . Neuropathy   . Osteoarthritis    DDD spine   . Osteoporosis with fracture    T12 compression fracture  . Photophobia   . Pulmonary embolism ([place])    10/2015 off coumadin as of 04/2016  . Pulmonary HTN (HCC)    mild pulm HTN, echo 10/09/15 EF  16-10%RUEAV 1 dd, RV systolic pressure increased   . Recurrent UTI   . Sinus pressure   . Skin cancer    BCC jawline and scalp   . Thyroid disease    follows [facility]  . TIA (transient ischemic attack)    MRI 2009/2010 neg stroke   . Trigeminal neuralgia    Dr. Tomi Bamberger s/p gamma knife x 2, on Tegretol since 2011/2012 no increase in dose >200 mg bid rec per family per neurology   . Urinary frequency   . Urinary, incontinence, stress female    Dr Erlene Quan urology     Medications:  Medications Prior to Admission  Medication Sig Dispense Refill Last Dose  . aspirin EC 81 MG tablet Take 81 mg by mouth daily.   03/27/2018 at Unknown time  . atorvastatin (LIPITOR) 40 MG tablet Take 40 mg by mouth daily at 6 PM.    03/26/2018 at Unknown time  . budesonide-formoterol (SYMBICORT) 160-4.5 MCG/ACT inhaler Inhale 2 puffs into the lungs 2 (two) times daily. 1 Inhaler 2 03/27/2018 at Unknown time  . Calcium Carbonate-Vitamin D (CALCIUM 600+D) 600-400 MG-UNIT tablet Take 1 tablet by mouth daily.    03/27/2018 at Unknown time  . carbamazepine (TEGRETOL) 200 MG tablet Take 1 tablet (200 mg total) by mouth 2 (two) times daily. 40 tablet 0 03/27/2018 at Unknown time  . docusate sodium (COLACE) 100 MG capsule Take 1 capsule (100 mg total) by mouth 2 (two) times daily. 10 capsule 0 03/27/2018 at Unknown time  . fluticasone (FLONASE) 50 MCG/ACT nasal spray Place 1 spray into both nostrils daily.    03/27/2018 at Unknown time  . furosemide (LASIX) 40 MG tablet Take 1 tablet (40 mg total) by mouth daily. 30 tablet 2 03/27/2018 at Unknown time  . gabapentin (NEURONTIN) 100 MG capsule Take 100 mg by mouth 2 (two) times daily.    03/27/2018 at Unknown time  . guaiFENesin (MUCINEX) 600 MG 12 hr tablet Take 600 mg by mouth daily.    03/27/2018 at Unknown time  . HYDROcodone-acetaminophen (NORCO/VICODIN) 5-325 MG tablet Take 1 tablet by mouth every 6 (six) hours as needed for moderate pain or severe pain. 120 tablet 0  03/27/2018 at Unknown time  . insulin regular (NOVOLIN R,HUMULIN R) 100 units/mL injection Inject 0-12 Units into the skin 3 (three) times daily before meals. 100 unit/mL; amt: Per Sliding Scale; If Blood Sugar is 176 to 250, give 3 Units.If Blood Sugar is 251 to 325, give 6 Units.If Blood Sugar  is 326 to 450, give 10 Units. If Blood Sugar is greater than 450, give 12 Units and recheck in 1 hour. Notify MD/NP if >450 after recheck. Check blood sugar AC & HS   03/27/2018 at Unknown time  . levothyroxine (SYNTHROID, LEVOTHROID) 200 MCG tablet Take 200 mcg by mouth daily before breakfast. Except on Sunday. Do not take with other medications   03/27/2018 at Unknown time  . metFORMIN (GLUCOPHAGE-XR) 500 MG 24 hr tablet Take 500 mg by mouth daily with breakfast.   03/27/2018 at Unknown time  . midodrine (PROAMATINE) 10 MG tablet Take 1 tablet (10 mg total) by mouth 3 (three) times daily with meals. 30 tablet 0 03/27/2018 at Unknown time  . Multiple Vitamin (MULTIVITAMIN WITH MINERALS) TABS tablet Take 1 tablet by mouth daily.   03/27/2018 at Unknown time  . Nutritional Supplements (ENSURE ENLIVE PO) Take 1 Bottle by mouth 2 (two) times daily.   03/27/2018 at PRN  . pantoprazole (PROTONIX) 40 MG tablet Take 1 tablet (40 mg total) by mouth daily. 90 tablet 3 03/27/2018 at Unknown time  . Polyethyl Glycol-Propyl Glycol 0.4-0.3 % SOLN Place 1 drop into both eyes at bedtime.    03/26/2018 at Unknown time  . tiotropium (SPIRIVA) 18 MCG inhalation capsule Place 1 capsule (18 mcg total) into inhaler and inhale daily. 90 capsule 3 03/27/2018 at Unknown time  . traZODone (DESYREL) 50 MG tablet Take 0.5 tablets (25 mg total) by mouth at bedtime as needed for sleep. 20 tablet 0 03/26/2018 at Unknown time  . acetaminophen (TYLENOL) 325 MG tablet Take 650 mg by mouth every 6 (six) hours as needed.   PRN at PRN  . Amino Acids-Protein Hydrolys (FEEDING SUPPLEMENT, PRO-STAT SUGAR FREE 64,) LIQD Take 30 mLs by mouth 2 (two) times daily  between meals.   PRN at PRN  . bisacodyl (DULCOLAX) 5 MG EC tablet Take 1 tablet (5 mg total) by mouth daily as needed for moderate constipation. 30 tablet 0 PRN at PRN  . magnesium hydroxide (MILK OF MAGNESIA) 400 MG/5ML suspension Take 30 mLs by mouth every 4 (four) hours as needed. Constipation/ no BM for 2 days   PRN at PRN  . polyethylene glycol powder (GLYCOLAX/MIRALAX) powder Take 17 g by mouth daily as needed for mild constipation.    PRN at PRN   Scheduled:  . aspirin EC  81 mg Oral Daily  . atorvastatin  40 mg Oral q1800  . calcium-vitamin D  1 tablet Oral Daily  . carbamazepine  200 mg Oral BID  . docusate sodium  100 mg Oral BID  . feeding supplement (PRO-STAT SUGAR FREE 64)  30 mL Oral BID BM  . fluticasone  1 spray Each Nare Daily  . furosemide  40 mg Oral Daily  . gabapentin  100 mg Oral BID  . guaiFENesin  600 mg Oral Daily  . insulin aspart  0-15 Units Subcutaneous TID WC  . insulin aspart  0-5 Units Subcutaneous QHS  . levothyroxine  200 mcg Oral QAC breakfast  . metFORMIN  500 mg Oral Q breakfast  . midodrine  10 mg Oral TID WC  . mometasone-formoterol  2 puff Inhalation BID  . multivitamin with minerals  1 tablet Oral Daily  . pantoprazole  40 mg Oral Daily  . polyvinyl alcohol  1 drop Both Eyes QHS  . sodium chloride flush  3 mL Intravenous Q12H  . tiotropium  18 mcg Inhalation Daily   Infusions:  . sodium chloride    .  ciprofloxacin    . heparin 1,000 Units/hr (03/28/18 1753)   PRN:  Anti-infectives (From admission, onward)   Start     Dose/Rate Route Frequency Ordered Stop   03/28/18 2139  ciprofloxacin (CIPRO) IVPB 400 mg     400 mg 200 mL/hr over 60 Minutes Intravenous 60 min pre-op 03/28/18 2139        Assessment: 84 year old female with dvt requiring heparin infusion. Dr Cinda Quest gave heparin 4000unit bolus x 1    Goal of Therapy:  Heparin level 0.3-0.7 units/ml Monitor platelets by anticoagulation protocol: Yes   Plan:  Give 4000 units  bolus x 1 Start heparin infusion at 1400 units/hr Check anti-Xa level in 8 hours and daily while on heparin Continue to monitor H&H and platelets   04/25 @ 0400 HL 1.28 supratherapeutic. Will hold drip for 1 hour and restart @ 1200 units/hr and will recheck @ 1500. Hgb down by one unit will recheck w/ am labs.  4/25@1730  HL 0.94 supratherapeutic, will decrease to heparin 1000units/hr and monitor in 8 hours.  04/26 @ 0100 HL 0.73 supratherapeutic. Will decrease rate to 850 units/hr and will recheck next HL @ 1200. CBC stable   Tobie Lords, PharmD, BCPS Clinical Pharmacist 03/29/2018

## 2018-03-29 NOTE — Progress Notes (Signed)
Patient here today for peripheral thrombectomy per DR Lucky Cowboy. Attached to monitor and will follow vitals throughout entire procedure.

## 2018-03-29 NOTE — Progress Notes (Signed)
Dr Posey Pronto at bedside, forwarded patient complaint of right arm aching and < 1 tsp red blood in sputum. Ice pack applied to right arm bruise, will continue to monitor.

## 2018-03-29 NOTE — Progress Notes (Signed)
[place] at [place] NAME: Jessica Erickson    MR#:  [id number]  DATE OF BIRTH:  [birth year]  SUBJECTIVE:  patient was seen in specials recovery. She is complaining of her right arm pain. She has some bruises secondary to blood draws which is hurting.ice pack provided helping her.  REVIEW OF SYSTEMS:   Review of Systems  Constitutional: Negative for chills, fever and weight loss.  HENT: Negative for ear discharge, ear pain and nosebleeds.   Eyes: Negative for blurred vision, pain and discharge.  Respiratory: Negative for sputum production, shortness of breath, wheezing and stridor.   Cardiovascular: Negative for chest pain, palpitations, orthopnea and PND.  Gastrointestinal: Negative for abdominal pain, diarrhea, nausea and vomiting.  Genitourinary: Negative for frequency and urgency.  Musculoskeletal: Positive for joint pain. Negative for back pain.  Neurological: Positive for weakness. Negative for sensory change, speech change and focal weakness.  Psychiatric/Behavioral: Negative for depression and hallucinations. The patient is not nervous/anxious.    Tolerating Diet:yes Tolerating PT: pending  DRUG ALLERGIES:   Allergies  Allergen Reactions  . Penicillins Shortness Of Breath, Rash and Other (See Comments)    Has patient had a PCN reaction causing immediate rash, facial/tongue/throat swelling, SOB or lightheadedness with hypotension: Yes Has patient had a PCN reaction causing severe rash involving mucus membranes or skin necrosis: No Has patient had a PCN reaction that required hospitalization No Has patient had a PCN reaction occurring within the last 10 years: No If all of the above answers are "NO", then may proceed with Cephalosporin use.  . Sulfa Antibiotics Shortness Of Breath, Rash and Other (See Comments)  . Amitiza [Lubiprostone]     N/v/d  . Aspirin Other (See Comments)    Reaction:  Unknown  Other reaction(s):  Bleeding (intolerance) Per patient " causes nose to bleed" Can take 81 mg daily without any complications Other reaction(s): "bloody nose"   . Penicillin G Other (See Comments)    Has patient had a PCN reaction causing immediate rash, facial/tongue/throat swelling, SOB or lightheadedness with hypotension: No Has patient had a PCN reaction causing severe rash involving mucus membranes or skin necrosis: Unknown Has patient had a PCN reaction that required hospitalization: Unknown Has patient had a PCN reaction occurring within the last 10 years: Unknown If all of the above answers are "NO", then may proceed with Cephalosporin use.    VITALS:  Blood pressure (!) 121/96, pulse 63, temperature 98.5 F (36.9 C), temperature source Oral, resp. rate 16, height 5\' 9"  (1.753 m), weight 93.9 kg (207 lb), SpO2 98 %.  PHYSICAL EXAMINATION:   Physical Exam  GENERAL:  84 y.o.-year-old patient lying in the bed with no acute distress.  EYES: Pupils equal, round, reactive to light and accommodation. No scleral icterus. Extraocular muscles intact.  HEENT: Head atraumatic, normocephalic. Oropharynx and nasopharynx clear.  NECK:  Supple, no jugular venous distention. No thyroid enlargement, no tenderness.  LUNGS: Normal breath sounds bilaterally, no wheezing, rales, rhonchi. No use of accessory muscles of respiration.  CARDIOVASCULAR: S1, S2 normal. No murmurs, rubs, or gallops.  ABDOMEN: Soft, nontender, nondistended. Bowel sounds present. No organomegaly or mass.  EXTREMITIES: bruise over the right elbow. Mild swelling. Tender to touch. No cellulitis no cyanosis, clubbing  -bilateral lower extremity edema + NEUROLOGIC: Cranial nerves II through XII are intact. No focal Motor or sensory deficits b/l.   PSYCHIATRIC:  patient is alert and oriented x 3.  SKIN: No obvious rash,  lesion, or ulcer.   LABORATORY PANEL:  CBC Recent Labs  Lab 03/29/18 0118  WBC 6.4  HGB 11.5*  HCT 34.2*  PLT 187     Chemistries  Recent Labs  Lab 03/27/18 1815 03/28/18 0414  NA 139 139  K 4.0 3.2*  CL 100* 102  CO2 32 30  GLUCOSE 113* 104*  BUN 20 19  CREATININE 1.15* 1.04*  CALCIUM 8.5* 8.0*  AST 25  --   ALT 13*  --   ALKPHOS 81  --   BILITOT 0.3  --    Cardiac Enzymes Recent Labs  Lab 03/27/18 1815  TROPONINI <0.03   RADIOLOGY:  US Venous Img Lower Bilateral  Result Date: 03/27/2018 CLINICAL DATA:  84 year old female with bilateral lower extremity edema for the past 2 weeks EXAM: BILATERAL LOWER EXTREMITY VENOUS DOPPLER ULTRASOUND TECHNIQUE: Gray-scale sonography with graded compression, as well as color Doppler and duplex ultrasound were performed to evaluate the lower extremity deep venous systems from the level of the common femoral vein and including the common femoral, femoral, profunda femoral, popliteal and calf veins including the posterior tibial, peroneal and gastrocnemius veins when visible. The superficial great saphenous vein was also interrogated. Spectral Doppler was utilized to evaluate flow at rest and with distal augmentation maneuvers in the common femoral, femoral and popliteal veins. COMPARISON:  Prior left lower extremity duplex venous ultrasound 05/03/2017 FINDINGS: RIGHT LOWER EXTREMITY Common Femoral Vein: Abnormal. The vessel is not compressible. The lumen is expanded and filled with low-level internal echoes. Minimal flow on color Doppler evaluation. Findings are consistent with occlusive DVT. Saphenofemoral Junction: No evidence of thrombus. Normal compressibility and flow on color Doppler imaging. Profunda Femoral Vein: Thrombus extends into the profunda femoral vein. This thrombus is nonocclusive. Femoral Vein: Thrombus extends into the femoral vein throughout the thigh. This is occlusive. Popliteal Vein: Popliteal vein is not well seen. There is evidence of color flow, however the vessel is incompletely compressible. Suspect nonocclusive thrombus. Calf Veins:  Poor visualization of the calf veins. Superficial Great Saphenous Vein: No evidence of thrombus. Normal compressibility. Venous Reflux:  None. Other Findings:  None. LEFT LOWER EXTREMITY Common Femoral Vein: Abnormal. The vessel is only partially compressible and the lumen is expanded with low-level internal echoes. No color flow on color Doppler evaluation consistent with occlusive thrombus. Saphenofemoral Junction: No evidence of thrombus. Normal compressibility and flow on color Doppler imaging. Profunda Femoral Vein: Occlusive thrombus extends into the profunda femoral vein. Femoral Vein: Occlusive thrombus extends into the superficial femoral vein and remains occlusive throughout the thigh. There may be some partial reconstitution of flow in the distal thigh. Popliteal Vein: Partially compressible. Evidence of at least some color flow on color Doppler evaluation. Findings are consistent with nonocclusive thrombus. Calf Veins: Poor visualization of the calf veins. Superficial Great Saphenous Vein: No evidence of thrombus. Normal compressibility. Venous Reflux:  None. Other Findings:  None. IMPRESSION: 1. Positive for extensive bilateral DVT extending from the common femoral vein through the femoral vein and into the popliteal veins bilaterally. These results will be called to the ordering clinician or representative by the Radiologist Assistant, and communication documented in the PACS or zVision Dashboard. Electronically Signed   By: Jacqulynn Cadet M.D.   On: 03/27/2018 16:11   ASSESSMENT AND PLAN:  84 year old elderly female patient with history of DVT in the lower extremity, pulmonary embolism, coronary artery disease, CVA, type 2 diabetes mellitus, hypertension was referred by her physician for extensive DVT in the lower  extremity.  * Bilateral lower extremity DVT IV heparin drip for anticoagulation -Appreicate Vascular surgery consultation for bilateral thrombectomy  * Diabetes mellitus type  2 Resume diabetic medication with sliding scale coverage with insulin  * Coronary artery disease Resume aspirin and statin  * Hypertension Medical management    Case discussed with Care Management/Social Worker. Management plans discussed with the patient, family and they are in agreement.  CODE STATUS: full  DVT Prophylaxis: IV heparin gtt  TOTAL TIME TAKING CARE OF THIS PATIENT: 30 minutes.  >50% time spent on counselling and coordination of care  POSSIBLE D/C IN 2-3 DAYS, DEPENDING ON CLINICAL CONDITION.  Note: This dictation was prepared with Dragon dictation along with smaller phrase technology. Any transcriptional errors that result from this process are unintentional.  Fritzi Mandes M.D on 03/29/2018 at 2:18 PM  Between 7am to 6pm - Pager - [phone]  After 6pm go to [email or web address] - password [email or web address] [facility]  Office  [phone]  CC: Primary care physician; McLean-Scocuzza, Nino Glow, MDPatient ID: Jessica Erickson, female   DOB: December 16, 1925, 84 y.o.   MRN: [id number]

## 2018-03-29 NOTE — Care Management (Signed)
Extensive bilateral DVT and vascular to schedule thrombectomy and thrombolytic therapy if family agrees after discussing amongst themselves.  Has been tentatively  placed on schedule for today

## 2018-03-30 LAB — CBC
HCT: 30.7 % — ABNORMAL LOW (ref 35.0–47.0)
Hemoglobin: 10.3 g/dL — ABNORMAL LOW (ref 12.0–16.0)
MCH: 31.4 pg (ref 26.0–34.0)
MCHC: 33.4 g/dL (ref 32.0–36.0)
MCV: 94 fL (ref 80.0–100.0)
Platelets: 188 10*3/uL (ref 150–440)
RBC: 3.27 MIL/uL — ABNORMAL LOW (ref 3.80–5.20)
RDW: 15 % — ABNORMAL HIGH (ref 11.5–14.5)
WBC: 6.6 10*3/uL (ref 3.6–11.0)

## 2018-03-30 LAB — BASIC METABOLIC PANEL
Anion gap: 6 (ref 5–15)
BUN: 16 mg/dL (ref 6–20)
CO2: 29 mmol/L (ref 22–32)
Calcium: 7.8 mg/dL — ABNORMAL LOW (ref 8.9–10.3)
Chloride: 104 mmol/L (ref 101–111)
Creatinine, Ser: 1.28 mg/dL — ABNORMAL HIGH (ref 0.44–1.00)
GFR calc Af Amer: 42 mL/min — ABNORMAL LOW (ref >60)
GFR calc non Af Amer: 36 mL/min — ABNORMAL LOW (ref >60)
Glucose, Bld: 106 mg/dL — ABNORMAL HIGH (ref 65–99)
Potassium: 3.9 mmol/L (ref 3.5–5.1)
Sodium: 139 mmol/L (ref 135–145)

## 2018-03-30 LAB — GLUCOSE, CAPILLARY
Glucose-Capillary: 96 mg/dL (ref 65–99)
Glucose-Capillary: 126 mg/dL — ABNORMAL HIGH (ref 65–99)
Glucose-Capillary: 105 mg/dL — ABNORMAL HIGH (ref 65–99)
Glucose-Capillary: 119 mg/dL — ABNORMAL HIGH (ref 65–99)

## 2018-03-30 LAB — HEPARIN LEVEL (UNFRACTIONATED)
Heparin Unfractionated: 0.43 IU/mL (ref 0.30–0.70)
Heparin Unfractionated: 0.44 IU/mL (ref 0.30–0.70)

## 2018-03-30 MED ORDER — WARFARIN - PHARMACIST DOSING INPATIENT: Freq: Every day | Status: DC

## 2018-03-30 MED ORDER — ENSURE ENLIVE PO LIQD: 1.0000 | Freq: Two times a day (BID) | ORAL | Status: DC

## 2018-03-30 MED ORDER — POLYETHYLENE GLYCOL 3350 17 GM/SCOOP PO POWD: 17.0000 g | Freq: Every day | ORAL | Status: DC | PRN

## 2018-03-30 MED ORDER — WARFARIN SODIUM 7.5 MG PO TABS
7.5000 mg | ORAL_TABLET | Freq: Every day | ORAL | Status: DC
  Administered 2018-03-30 – 2018-04-01 (×3): 7.5 mg via ORAL
  Filled 2018-03-30 (×3): qty 1

## 2018-03-30 MED ORDER — CARBAMAZEPINE 200 MG PO TABS: 200.0000 mg | ORAL_TABLET | Freq: Two times a day (BID) | ORAL | Status: DC

## 2018-03-30 MED ORDER — DOCUSATE SODIUM 100 MG PO CAPS: 100.0000 mg | ORAL_CAPSULE | Freq: Two times a day (BID) | ORAL | Status: DC

## 2018-03-30 MED ORDER — HYDROCODONE-ACETAMINOPHEN 5-325 MG PO TABS
1.0000 | ORAL_TABLET | Freq: Four times a day (QID) | ORAL | Status: DC | PRN
Start: 2018-03-30 — End: 2018-04-01
  Administered 2018-03-31 (×4): 1 via ORAL
  Administered 2018-04-01: 2 via ORAL
  Filled 2018-03-30 (×6): qty 1

## 2018-03-30 MED ORDER — GABAPENTIN 100 MG PO CAPS: 100.0000 mg | ORAL_CAPSULE | Freq: Two times a day (BID) | ORAL | Status: DC

## 2018-03-30 NOTE — Progress Notes (Signed)
ANTICOAGULATION CONSULT NOTE - Initial Consult  Pharmacy Consult for heparin gtt Indication: DVT  Allergies  Allergen Reactions  . Penicillins Shortness Of Breath, Rash and Other (See Comments)    Has patient had a PCN reaction causing immediate rash, facial/tongue/throat swelling, SOB or lightheadedness with hypotension: Yes Has patient had a PCN reaction causing severe rash involving mucus membranes or skin necrosis: No Has patient had a PCN reaction that required hospitalization No Has patient had a PCN reaction occurring within the last 10 years: No If all of the above answers are "NO", then may proceed with Cephalosporin use.  . Sulfa Antibiotics Shortness Of Breath, Rash and Other (See Comments)  . Amitiza [Lubiprostone]     N/v/d  . Aspirin Other (See Comments)    Reaction:  Unknown  Other reaction(s): Bleeding (intolerance) Per patient " causes nose to bleed" Can take 81 mg daily without any complications Other reaction(s): "bloody nose"   . Penicillin G Other (See Comments)    Has patient had a PCN reaction causing immediate rash, facial/tongue/throat swelling, SOB or lightheadedness with hypotension: No Has patient had a PCN reaction causing severe rash involving mucus membranes or skin necrosis: Unknown Has patient had a PCN reaction that required hospitalization: Unknown Has patient had a PCN reaction occurring within the last 10 years: Unknown If all of the above answers are "NO", then may proceed with Cephalosporin use.    Patient Measurements: Height: 5\' 9"  (175.3 cm) Weight: 207 lb (93.9 kg) IBW/kg (Calculated) : 66.2 Heparin Dosing Weight: 86.1kg  Vital Signs: Temp: 97.3 F (36.3 C) (04/26 2014) Temp Source: Oral (04/26 2014) BP: 138/67 (04/26 2014) Pulse Rate: 66 (04/26 2014)  Labs: Recent Labs    03/27/18 1815 03/28/18 0414  03/29/18 0118 03/29/18 0501 03/30/18 0009  HGB 12.3 11.3*  --  11.5*  --   --   HCT 35.8 33.4*  --  34.2*  --   --   PLT  196 178  --  187  --   --   APTT 27  --   --   --   --   --   LABPROT 13.3  --   --   --   --   --   INR 1.02  --   --   --   --   --   HEPARINUNFRC  --  1.28*   < > 0.73* 0.75* 0.43  CREATININE 1.15* 1.04*  --   --   --   --   TROPONINI <0.03  --   --   --   --   --    < > = values in this interval not displayed.    Estimated Creatinine Clearance: 44.8 mL/min (A) (by C-G formula based on SCr of 1.04 mg/dL (H)).   Medical History: Past Medical History:  Diagnosis Date  . Acoustic neuroma ([place])   . Allergy   . Asthma   . Bilateral swelling of feet    and legs  . Bladder infection   . CAD (coronary artery disease)   . Cataract   . Change in voice   . Compression fracture of body of thoracic vertebra (HCC)    T12 09/18/15 MRI s/p fall   . Constipation   . COPD (chronic obstructive pulmonary disease) ([place])    previous CXR with chronic interstitial lung dz   . CVA (cerebral vascular accident) ([place])   . Depression   . Diabetes ([place])    with neuropathy  .  Diabetes mellitus, type 2 ([place])   . Diarrhea   . Double vision   . DVT (deep venous thrombosis) ([place])    right leg 10/2015 was on coumadin off as of 2017/2018 ; s/p IVC filter  . Enuresis   . Eye pain, right   . Fall   . Fatty liver    09/15/15 also mildly dilated pancreatitic duct rec MRCP small sub cm cyst hemangioma speeln mild right hydronephrorossi and prox. hydroureter, kidney stones, mild scarring kidneys  . Female stress incontinence   . Flank pain   . GERD (gastroesophageal reflux disease)    with small hiatal hernia   . Hard of hearing   . Heart disease   . History of kidney problems   . Hyperlipidemia    mixed  . Hypertension   . Hypothyroidism, postsurgical   . Impaired mobility and ADLs    uses rolling walker has caretaker 24/7 at home  . Leg edema   . Mixed incontinence urge and stress (female)(female)   . Neuropathy   . Osteoarthritis    DDD spine   . Osteoporosis with fracture    T12 compression  fracture  . Photophobia   . Pulmonary embolism ([place])    10/2015 off coumadin as of 04/2016  . Pulmonary HTN (HCC)    mild pulm HTN, echo 10/09/15 EF 93-79%KWIOX 1 dd, RV systolic pressure increased   . Recurrent UTI   . Sinus pressure   . Skin cancer    BCC jawline and scalp   . Thyroid disease    follows [facility]  . TIA (transient ischemic attack)    MRI 2009/2010 neg stroke   . Trigeminal neuralgia    Dr. Tomi Bamberger s/p gamma knife x 2, on Tegretol since 2011/2012 no increase in dose >200 mg bid rec per family per neurology   . Urinary frequency   . Urinary, incontinence, stress female    Dr Erlene Quan urology     Medications:  Medications Prior to Admission  Medication Sig Dispense Refill Last Dose  . aspirin EC 81 MG tablet Take 81 mg by mouth daily.   03/27/2018 at Unknown time  . atorvastatin (LIPITOR) 40 MG tablet Take 40 mg by mouth daily at 6 PM.    03/26/2018 at Unknown time  . budesonide-formoterol (SYMBICORT) 160-4.5 MCG/ACT inhaler Inhale 2 puffs into the lungs 2 (two) times daily. 1 Inhaler 2 03/27/2018 at Unknown time  . Calcium Carbonate-Vitamin D (CALCIUM 600+D) 600-400 MG-UNIT tablet Take 1 tablet by mouth daily.    03/27/2018 at Unknown time  . carbamazepine (TEGRETOL) 200 MG tablet Take 1 tablet (200 mg total) by mouth 2 (two) times daily. 40 tablet 0 03/27/2018 at Unknown time  . docusate sodium (COLACE) 100 MG capsule Take 1 capsule (100 mg total) by mouth 2 (two) times daily. 10 capsule 0 03/27/2018 at Unknown time  . fluticasone (FLONASE) 50 MCG/ACT nasal spray Place 1 spray into both nostrils daily.    03/27/2018 at Unknown time  . furosemide (LASIX) 40 MG tablet Take 1 tablet (40 mg total) by mouth daily. 30 tablet 2 03/27/2018 at Unknown time  . gabapentin (NEURONTIN) 100 MG capsule Take 100 mg by mouth 2 (two) times daily.    03/27/2018 at Unknown time  . guaiFENesin (MUCINEX) 600 MG 12 hr tablet Take 600 mg by mouth daily.    03/27/2018 at Unknown time  .  HYDROcodone-acetaminophen (NORCO/VICODIN) 5-325 MG tablet Take 1 tablet by mouth every 6 (six)  hours as needed for moderate pain or severe pain. 120 tablet 0 03/27/2018 at Unknown time  . insulin regular (NOVOLIN R,HUMULIN R) 100 units/mL injection Inject 0-12 Units into the skin 3 (three) times daily before meals. 100 unit/mL; amt: Per Sliding Scale; If Blood Sugar is 176 to 250, give 3 Units.If Blood Sugar is 251 to 325, give 6 Units.If Blood Sugar is 326 to 450, give 10 Units. If Blood Sugar is greater than 450, give 12 Units and recheck in 1 hour. Notify MD/NP if >450 after recheck. Check blood sugar AC & HS   03/27/2018 at Unknown time  . levothyroxine (SYNTHROID, LEVOTHROID) 200 MCG tablet Take 200 mcg by mouth daily before breakfast. Except on Sunday. Do not take with other medications   03/27/2018 at Unknown time  . metFORMIN (GLUCOPHAGE-XR) 500 MG 24 hr tablet Take 500 mg by mouth daily with breakfast.   03/27/2018 at Unknown time  . midodrine (PROAMATINE) 10 MG tablet Take 1 tablet (10 mg total) by mouth 3 (three) times daily with meals. 30 tablet 0 03/27/2018 at Unknown time  . Multiple Vitamin (MULTIVITAMIN WITH MINERALS) TABS tablet Take 1 tablet by mouth daily.   03/27/2018 at Unknown time  . Nutritional Supplements (ENSURE ENLIVE PO) Take 1 Bottle by mouth 2 (two) times daily.   03/27/2018 at PRN  . pantoprazole (PROTONIX) 40 MG tablet Take 1 tablet (40 mg total) by mouth daily. 90 tablet 3 03/27/2018 at Unknown time  . Polyethyl Glycol-Propyl Glycol 0.4-0.3 % SOLN Place 1 drop into both eyes at bedtime.    03/26/2018 at Unknown time  . tiotropium (SPIRIVA) 18 MCG inhalation capsule Place 1 capsule (18 mcg total) into inhaler and inhale daily. 90 capsule 3 03/27/2018 at Unknown time  . traZODone (DESYREL) 50 MG tablet Take 0.5 tablets (25 mg total) by mouth at bedtime as needed for sleep. 20 tablet 0 03/26/2018 at Unknown time  . acetaminophen (TYLENOL) 325 MG tablet Take 650 mg by mouth every 6  (six) hours as needed.   PRN at PRN  . Amino Acids-Protein Hydrolys (FEEDING SUPPLEMENT, PRO-STAT SUGAR FREE 64,) LIQD Take 30 mLs by mouth 2 (two) times daily between meals.   PRN at PRN  . bisacodyl (DULCOLAX) 5 MG EC tablet Take 1 tablet (5 mg total) by mouth daily as needed for moderate constipation. 30 tablet 0 PRN at PRN  . magnesium hydroxide (MILK OF MAGNESIA) 400 MG/5ML suspension Take 30 mLs by mouth every 4 (four) hours as needed. Constipation/ no BM for 2 days   PRN at PRN  . polyethylene glycol powder (GLYCOLAX/MIRALAX) powder Take 17 g by mouth daily as needed for mild constipation.    PRN at PRN   Scheduled:  . aspirin EC  81 mg Oral Daily  . atorvastatin  40 mg Oral q1800  . calcium-vitamin D  1 tablet Oral Daily  . carbamazepine  200 mg Oral BID  . docusate sodium  100 mg Oral BID  . feeding supplement (PRO-STAT SUGAR FREE 64)  30 mL Oral BID BM  . fluticasone  1 spray Each Nare Daily  . gabapentin  100 mg Oral BID  . guaiFENesin  600 mg Oral Daily  . insulin aspart  0-15 Units Subcutaneous TID WC  . insulin aspart  0-5 Units Subcutaneous QHS  . levothyroxine  200 mcg Oral QAC breakfast  . midodrine  10 mg Oral TID WC  . mometasone-formoterol  2 puff Inhalation BID  . multivitamin with minerals  1 tablet Oral Daily  . pantoprazole  40 mg Oral Daily  . polyvinyl alcohol  1 drop Both Eyes QHS  . sodium chloride flush  3 mL Intravenous Q12H  . tiotropium  18 mcg Inhalation Daily   Infusions:  . sodium chloride    . heparin 850 Units/hr (03/29/18 1727)   PRN:  Anti-infectives (From admission, onward)   Start     Dose/Rate Route Frequency Ordered Stop   03/28/18 2139  ciprofloxacin (CIPRO) IVPB 400 mg  Status:  Discontinued     400 mg 200 mL/hr over 60 Minutes Intravenous 60 min pre-op 03/28/18 2139 03/29/18 1616      Assessment: 84 year old female with dvt requiring heparin infusion.  Patient is s/p thrombectomy. Dr. Lucky Cowboy wants to continue dosing heparin with  normal goal and bolus if needed post-thrombectomy.    Goal of Therapy:  Heparin level 0.3-0.7 units/ml Monitor platelets by anticoagulation protocol: Yes   Plan:  Will check a HL 8 hours after infusion resumed.   04/27 @ 0000 HL 0.43 therapeutic. Will continue current rate and will recheck @ 0800. CBC check w/ am labs.  Tobie Lords, PharmD, BCPS Clinical Pharmacist 03/30/2018

## 2018-03-30 NOTE — Progress Notes (Signed)
Section at [facility] NAME: Jessica Erickson    MR#:  [id number]  DATE OF BIRTH:  June 20, 1926  SUBJECTIVE:  patient was seen in specials recovery. She is complaining of her right arm pain. She has some bruises secondary to blood draws which is hurting.ice pack provided helping her. Family in the room. Spoke with son.  REVIEW OF SYSTEMS:   Review of Systems  Constitutional: Negative for chills, fever and weight loss.  HENT: Negative for ear discharge, ear pain and nosebleeds.   Eyes: Negative for blurred vision, pain and discharge.  Respiratory: Negative for sputum production, shortness of breath, wheezing and stridor.   Cardiovascular: Negative for chest pain, palpitations, orthopnea and PND.  Gastrointestinal: Negative for abdominal pain, diarrhea, nausea and vomiting.  Genitourinary: Negative for frequency and urgency.  Musculoskeletal: Positive for joint pain. Negative for back pain.  Neurological: Positive for weakness. Negative for sensory change, speech change and focal weakness.  Psychiatric/Behavioral: Negative for depression and hallucinations. The patient is not nervous/anxious.    Tolerating Diet:yes Tolerating PT: pending  DRUG ALLERGIES:   Allergies  Allergen Reactions  . Penicillins Shortness Of Breath, Rash and Other (See Comments)    Has patient had a PCN reaction causing immediate rash, facial/tongue/throat swelling, SOB or lightheadedness with hypotension: Yes Has patient had a PCN reaction causing severe rash involving mucus membranes or skin necrosis: No Has patient had a PCN reaction that required hospitalization No Has patient had a PCN reaction occurring within the last 10 years: No If all of the above answers are "NO", then may proceed with Cephalosporin use.  . Sulfa Antibiotics Shortness Of Breath, Rash and Other (See Comments)  . Amitiza [Lubiprostone]     N/v/d  . Aspirin Other (See Comments)    Reaction:   Unknown  Other reaction(s): Bleeding (intolerance) Per patient " causes nose to bleed" Can take 81 mg daily without any complications Other reaction(s): "bloody nose"   . Penicillin G Other (See Comments)    Has patient had a PCN reaction causing immediate rash, facial/tongue/throat swelling, SOB or lightheadedness with hypotension: No Has patient had a PCN reaction causing severe rash involving mucus membranes or skin necrosis: Unknown Has patient had a PCN reaction that required hospitalization: Unknown Has patient had a PCN reaction occurring within the last 10 years: Unknown If all of the above answers are "NO", then may proceed with Cephalosporin use.    VITALS:  Blood pressure 110/62, pulse 70, temperature 98.2 F (36.8 C), temperature source Oral, resp. rate 20, height 5\' 9"  (1.753 m), weight 93.9 kg (207 lb), SpO2 93 %.  PHYSICAL EXAMINATION:   Physical Exam  GENERAL:  84 y.o.-year-old patient lying in the bed with no acute distress.  EYES: Pupils equal, round, reactive to light and accommodation. No scleral icterus. Extraocular muscles intact.  HEENT: Head atraumatic, normocephalic. Oropharynx and nasopharynx clear.  NECK:  Supple, no jugular venous distention. No thyroid enlargement, no tenderness.  LUNGS: Normal breath sounds bilaterally, no wheezing, rales, rhonchi. No use of accessory muscles of respiration.  CARDIOVASCULAR: S1, S2 normal. No murmurs, rubs, or gallops.  ABDOMEN: Soft, nontender, nondistended. Bowel sounds present. No organomegaly or mass.  EXTREMITIES: bruise over the right elbow. Mild swelling. Tender to touch. No cellulitis no cyanosis, clubbing  -bilateral lower extremity edema + NEUROLOGIC: Cranial nerves II through XII are intact. No focal Motor or sensory deficits b/l.   PSYCHIATRIC:  patient is alert and oriented x  3.  SKIN: No obvious rash, lesion, or ulcer.   LABORATORY PANEL:  CBC Recent Labs  Lab 03/30/18 0745  WBC 6.6  HGB 10.3*   HCT 30.7*  PLT 188    Chemistries  Recent Labs  Lab 03/27/18 1815  03/30/18 0745  NA 139   < > 139  K 4.0   < > 3.9  CL 100*   < > 104  CO2 32   < > 29  GLUCOSE 113*   < > 106*  BUN 20   < > 16  CREATININE 1.15*   < > 1.28*  CALCIUM 8.5*   < > 7.8*  AST 25  --   --   ALT 13*  --   --   ALKPHOS 81  --   --   BILITOT 0.3  --   --    < > = values in this interval not displayed.   Cardiac Enzymes Recent Labs  Lab 03/27/18 1815  TROPONINI <0.03   RADIOLOGY:  No results found. ASSESSMENT AND PLAN:  84 year old elderly female patient with history of DVT in the lower extremity, pulmonary embolism, coronary artery disease, CVA, type 2 diabetes mellitus, hypertension was referred by her physician for extensive DVT in the lower extremity.  * Bilateral lower extremity DVT IV heparin drip for anticoagulation---add oral anticoagulation. Patient was on warfarin previously for her history of DVT and pulmonary embolism per her son -Appreicate Vascular surgery consultation for bilateral thrombectomy -initiate physical therapy  * Diabetes mellitus type 2 Resume diabetic medication with sliding scale coverage with insulin  * Coronary artery disease Resume aspirin and statin  * Hypertension Medical management  *Right elbow bruise-PRN pain meds and ice pack    Case discussed with Care Management/Social Worker. Management plans discussed with the patient, family and they are in agreement.  CODE STATUS: full  DVT Prophylaxis: IV heparin gtt  TOTAL TIME TAKING CARE OF THIS PATIENT: 30 minutes.  >50% time spent on counselling and coordination of care  POSSIBLE D/C IN 2-3 DAYS, DEPENDING ON CLINICAL CONDITION.  Note: This dictation was prepared with Dragon dictation along with smaller phrase technology. Any transcriptional errors that result from this process are unintentional.  Fritzi Mandes M.D on 03/30/2018 at 4:00 PM  Between 7am to 6pm - Pager - [phone]  After  6pm go to [email or web address] - password [email or web address]  Office  [phone]  CC: Primary care physician; McLean-Scocuzza, Nino Glow, MDPatient ID: Jessica Erickson, female   DOB: 03/13/1926, 84 y.o.   MRN: [id number]

## 2018-03-30 NOTE — Progress Notes (Signed)
[facility] for heparin drip/warfarin dosing  Indication: DVT  Allergies  Allergen Reactions  . Penicillins Shortness Of Breath, Rash and Other (See Comments)    Has patient had a PCN reaction causing immediate rash, facial/tongue/throat swelling, SOB or lightheadedness with hypotension: Yes Has patient had a PCN reaction causing severe rash involving mucus membranes or skin necrosis: No Has patient had a PCN reaction that required hospitalization No Has patient had a PCN reaction occurring within the last 10 years: No If all of the above answers are "NO", then may proceed with Cephalosporin use.  . Sulfa Antibiotics Shortness Of Breath, Rash and Other (See Comments)  . Amitiza [Lubiprostone]     N/v/d  . Aspirin Other (See Comments)    Reaction:  Unknown  Other reaction(s): Bleeding (intolerance) Per patient " causes nose to bleed" Can take 81 mg daily without any complications Other reaction(s): "bloody nose"   . Penicillin G Other (See Comments)    Has patient had a PCN reaction causing immediate rash, facial/tongue/throat swelling, SOB or lightheadedness with hypotension: No Has patient had a PCN reaction causing severe rash involving mucus membranes or skin necrosis: Unknown Has patient had a PCN reaction that required hospitalization: Unknown Has patient had a PCN reaction occurring within the last 10 years: Unknown If all of the above answers are "NO", then may proceed with Cephalosporin use.    Patient Measurements: Height: 5\' 9"  (175.3 cm) Weight: 207 lb (93.9 kg) IBW/kg (Calculated) : 66.2 Heparin Dosing Weight: 86.1kg  Vital Signs: Temp: 98.2 F (36.8 C) (04/27 0537) Temp Source: Oral (04/27 0537) BP: 110/62 (04/27 0537) Pulse Rate: 70 (04/27 0537)  Labs: Recent Labs    03/27/18 1815 03/28/18 0414  03/29/18 0118 03/29/18 0501 03/30/18 0009 03/30/18 0745  HGB 12.3 11.3*  --  11.5*  --   --  10.3*  HCT 35.8 33.4*  --   34.2*  --   --  30.7*  PLT 196 178  --  187  --   --  188  APTT 27  --   --   --   --   --   --   LABPROT 13.3  --   --   --   --   --   --   INR 1.02  --   --   --   --   --   --   HEPARINUNFRC  --  1.28*   < > 0.73* 0.75* 0.43 0.44  CREATININE 1.15* 1.04*  --   --   --   --  1.28*  TROPONINI <0.03  --   --   --   --   --   --    < > = values in this interval not displayed.    Estimated Creatinine Clearance: 36.4 mL/min (A) (by C-G formula based on SCr of 1.28 mg/dL (H)).   Medical History: Past Medical History:  Diagnosis Date  . Acoustic neuroma ([place])   . Allergy   . Asthma   . Bilateral swelling of feet    and legs  . Bladder infection   . CAD (coronary artery disease)   . Cataract   . Change in voice   . Compression fracture of body of thoracic vertebra (HCC)    T12 09/18/15 MRI s/p fall   . Constipation   . COPD (chronic obstructive pulmonary disease) ([place])    previous CXR with chronic interstitial lung dz   .  CVA (cerebral vascular accident) ([place])   . Depression   . Diabetes ([place])    with neuropathy  . Diabetes mellitus, type 2 ([place])   . Diarrhea   . Double vision   . DVT (deep venous thrombosis) ([place])    right leg 10/2015 was on coumadin off as of 2017/2018 ; s/p IVC filter  . Enuresis   . Eye pain, right   . Fall   . Fatty liver    09/15/15 also mildly dilated pancreatitic duct rec MRCP small sub cm cyst hemangioma speeln mild right hydronephrorossi and prox. hydroureter, kidney stones, mild scarring kidneys  . Female stress incontinence   . Flank pain   . GERD (gastroesophageal reflux disease)    with small hiatal hernia   . Hard of hearing   . Heart disease   . History of kidney problems   . Hyperlipidemia    mixed  . Hypertension   . Hypothyroidism, postsurgical   . Impaired mobility and ADLs    uses rolling walker has caretaker 24/7 at home  . Leg edema   . Mixed incontinence urge and stress (female)(female)   . Neuropathy   . Osteoarthritis     DDD spine   . Osteoporosis with fracture    T12 compression fracture  . Photophobia   . Pulmonary embolism ([place])    10/2015 off coumadin as of 04/2016  . Pulmonary HTN (HCC)    mild pulm HTN, echo 10/09/15 EF 16-10%RUEAV 1 dd, RV systolic pressure increased   . Recurrent UTI   . Sinus pressure   . Skin cancer    BCC jawline and scalp   . Thyroid disease    follows [facility]  . TIA (transient ischemic attack)    MRI 2009/2010 neg stroke   . Trigeminal neuralgia    Dr. Tomi Bamberger s/p gamma knife x 2, on Tegretol since 2011/2012 no increase in dose >200 mg bid rec per family per neurology   . Urinary frequency   . Urinary, incontinence, stress female    Dr Erlene Quan urology     Medications:  Scheduled:  . aspirin EC  81 mg Oral Daily  . atorvastatin  40 mg Oral q1800  . calcium-vitamin D  1 tablet Oral Daily  . carbamazepine  200 mg Oral BID  . docusate sodium  100 mg Oral BID  . feeding supplement (PRO-STAT SUGAR FREE 64)  30 mL Oral BID BM  . fluticasone  1 spray Each Nare Daily  . gabapentin  100 mg Oral BID  . guaiFENesin  600 mg Oral Daily  . insulin aspart  0-15 Units Subcutaneous TID WC  . insulin aspart  0-5 Units Subcutaneous QHS  . levothyroxine  200 mcg Oral QAC breakfast  . midodrine  10 mg Oral TID WC  . mometasone-formoterol  2 puff Inhalation BID  . multivitamin with minerals  1 tablet Oral Daily  . pantoprazole  40 mg Oral Daily  . polyvinyl alcohol  1 drop Both Eyes QHS  . sodium chloride flush  3 mL Intravenous Q12H  . tiotropium  18 mcg Inhalation Daily  . warfarin  7.5 mg Oral q1800  . Warfarin - Pharmacist Dosing Inpatient   Does not apply q1800   Infusions:  . sodium chloride    . heparin 850 Units/hr (03/29/18 1727)    Assessment: Pharmacy consulted for heparin drip management for 84 yo female admitted with DVT. Patient being initiated on warfarin on 4/27.   Patient  currently receiving heparin drip at 850 units/hr.   Patient on  carbamazepine as an outpatient. Patient will need warfarin therapy due to interaction with DOACs and carbamazepine. Patient has taken warfarin in the past.    Goal of Therapy:  Heparin level 0.3-0.7 units/ml Monitor platelets by anticoagulation protocol: Yes   Plan:  Will initiate warfarin 7.5mg  PO Q18hr for goal INR 2-3. Patient will need bridging for at least 5 days and two consecutive INRs.   Will continue heparin at 850 units/hr and obtain follow up anti-Xa level and INR with am labs.   Pharmacy will continue to monitor and adjust per consult.    Currie Paris, PharmD 03/30/2018

## 2018-03-30 NOTE — Progress Notes (Signed)
[place] for heparin drip Indication: DVT  Allergies  Allergen Reactions  . Penicillins Shortness Of Breath, Rash and Other (See Comments)    Has patient had a PCN reaction causing immediate rash, facial/tongue/throat swelling, SOB or lightheadedness with hypotension: Yes Has patient had a PCN reaction causing severe rash involving mucus membranes or skin necrosis: No Has patient had a PCN reaction that required hospitalization No Has patient had a PCN reaction occurring within the last 10 years: No If all of the above answers are "NO", then may proceed with Cephalosporin use.  . Sulfa Antibiotics Shortness Of Breath, Rash and Other (See Comments)  . Amitiza [Lubiprostone]     N/v/d  . Aspirin Other (See Comments)    Reaction:  Unknown  Other reaction(s): Bleeding (intolerance) Per patient " causes nose to bleed" Can take 81 mg daily without any complications Other reaction(s): "bloody nose"   . Penicillin G Other (See Comments)    Has patient had a PCN reaction causing immediate rash, facial/tongue/throat swelling, SOB or lightheadedness with hypotension: No Has patient had a PCN reaction causing severe rash involving mucus membranes or skin necrosis: Unknown Has patient had a PCN reaction that required hospitalization: Unknown Has patient had a PCN reaction occurring within the last 10 years: Unknown If all of the above answers are "NO", then may proceed with Cephalosporin use.    Patient Measurements: Height: 5\' 9"  (175.3 cm) Weight: 207 lb (93.9 kg) IBW/kg (Calculated) : 66.2 Heparin Dosing Weight: 86.1kg  Vital Signs: Temp: 98.2 F (36.8 C) (04/27 0537) Temp Source: Oral (04/27 0537) BP: 110/62 (04/27 0537) Pulse Rate: 70 (04/27 0537)  Labs: Recent Labs    03/27/18 1815 03/28/18 0414  03/29/18 0118 03/29/18 0501 03/30/18 0009 03/30/18 0745  HGB 12.3 11.3*  --  11.5*  --   --  10.3*  HCT 35.8 33.4*  --  34.2*  --   --   30.7*  PLT 196 178  --  187  --   --  188  APTT 27  --   --   --   --   --   --   LABPROT 13.3  --   --   --   --   --   --   INR 1.02  --   --   --   --   --   --   HEPARINUNFRC  --  1.28*   < > 0.73* 0.75* 0.43 0.44  CREATININE 1.15* 1.04*  --   --   --   --  1.28*  TROPONINI <0.03  --   --   --   --   --   --    < > = values in this interval not displayed.    Estimated Creatinine Clearance: 36.4 mL/min (A) (by C-G formula based on SCr of 1.28 mg/dL (H)).   Medical History: Past Medical History:  Diagnosis Date  . Acoustic neuroma ([place])   . Allergy   . Asthma   . Bilateral swelling of feet    and legs  . Bladder infection   . CAD (coronary artery disease)   . Cataract   . Change in voice   . Compression fracture of body of thoracic vertebra (HCC)    T12 09/18/15 MRI s/p fall   . Constipation   . COPD (chronic obstructive pulmonary disease) ([place])    previous CXR with chronic interstitial lung dz   . CVA (cerebral  vascular accident) ([place])   . Depression   . Diabetes ([place])    with neuropathy  . Diabetes mellitus, type 2 ([place])   . Diarrhea   . Double vision   . DVT (deep venous thrombosis) ([place])    right leg 10/2015 was on coumadin off as of 2017/2018 ; s/p IVC filter  . Enuresis   . Eye pain, right   . Fall   . Fatty liver    09/15/15 also mildly dilated pancreatitic duct rec MRCP small sub cm cyst hemangioma speeln mild right hydronephrorossi and prox. hydroureter, kidney stones, mild scarring kidneys  . Female stress incontinence   . Flank pain   . GERD (gastroesophageal reflux disease)    with small hiatal hernia   . Hard of hearing   . Heart disease   . History of kidney problems   . Hyperlipidemia    mixed  . Hypertension   . Hypothyroidism, postsurgical   . Impaired mobility and ADLs    uses rolling walker has caretaker 24/7 at home  . Leg edema   . Mixed incontinence urge and stress (female)(female)   . Neuropathy   . Osteoarthritis    DDD spine   .  Osteoporosis with fracture    T12 compression fracture  . Photophobia   . Pulmonary embolism ([place])    10/2015 off coumadin as of 04/2016  . Pulmonary HTN (HCC)    mild pulm HTN, echo 10/09/15 EF 17-00%FVCBS 1 dd, RV systolic pressure increased   . Recurrent UTI   . Sinus pressure   . Skin cancer    BCC jawline and scalp   . Thyroid disease    follows [facility]  . TIA (transient ischemic attack)    MRI 2009/2010 neg stroke   . Trigeminal neuralgia    Dr. Tomi Bamberger s/p gamma knife x 2, on Tegretol since 2011/2012 no increase in dose >200 mg bid rec per family per neurology   . Urinary frequency   . Urinary, incontinence, stress female    Dr Erlene Quan urology     Medications:  Scheduled:  . aspirin EC  81 mg Oral Daily  . atorvastatin  40 mg Oral q1800  . calcium-vitamin D  1 tablet Oral Daily  . carbamazepine  200 mg Oral BID  . docusate sodium  100 mg Oral BID  . feeding supplement (PRO-STAT SUGAR FREE 64)  30 mL Oral BID BM  . fluticasone  1 spray Each Nare Daily  . gabapentin  100 mg Oral BID  . guaiFENesin  600 mg Oral Daily  . insulin aspart  0-15 Units Subcutaneous TID WC  . insulin aspart  0-5 Units Subcutaneous QHS  . levothyroxine  200 mcg Oral QAC breakfast  . midodrine  10 mg Oral TID WC  . mometasone-formoterol  2 puff Inhalation BID  . multivitamin with minerals  1 tablet Oral Daily  . pantoprazole  40 mg Oral Daily  . polyvinyl alcohol  1 drop Both Eyes QHS  . sodium chloride flush  3 mL Intravenous Q12H  . tiotropium  18 mcg Inhalation Daily   Infusions:  . sodium chloride    . heparin 850 Units/hr (03/29/18 1727)    Assessment: Pharmacy consulted for heparin drip management for 84 yo female admitted with DVT.   Patient is s/p thrombectomy. Dr. Lucky Cowboy wants to continue dosing heparin with normal goal and bolus if needed post-thrombectomy.   Patient currently receiving heparin drip at 850 units/hr.  Goal of Therapy:  Heparin level 0.3-0.7  units/ml Monitor platelets by anticoagulation protocol: Yes   Plan:  Confirmatory level is in range. Will continue heparin at 850 units/hr and obtain follow up anti-Xa level with am labs.   Pharmacy will continue to monitor and adjust per consult.     Currie Paris, PharmD 03/30/2018

## 2018-03-31 LAB — PROTIME-INR
INR: 1.06
Prothrombin Time: 13.7 seconds (ref 11.4–15.2)

## 2018-03-31 LAB — GLUCOSE, CAPILLARY
Glucose-Capillary: 107 mg/dL — ABNORMAL HIGH (ref 65–99)
Glucose-Capillary: 130 mg/dL — ABNORMAL HIGH (ref 65–99)
Glucose-Capillary: 125 mg/dL — ABNORMAL HIGH (ref 65–99)
Glucose-Capillary: 121 mg/dL — ABNORMAL HIGH (ref 65–99)

## 2018-03-31 LAB — CBC
HCT: 29.3 % — ABNORMAL LOW (ref 35.0–47.0)
Hemoglobin: 9.9 g/dL — ABNORMAL LOW (ref 12.0–16.0)
MCH: 31.8 pg (ref 26.0–34.0)
MCHC: 33.7 g/dL (ref 32.0–36.0)
MCV: 94.6 fL (ref 80.0–100.0)
Platelets: 205 10*3/uL (ref 150–440)
RBC: 3.1 MIL/uL — ABNORMAL LOW (ref 3.80–5.20)
RDW: 14.9 % — ABNORMAL HIGH (ref 11.5–14.5)
WBC: 7 10*3/uL (ref 3.6–11.0)

## 2018-03-31 LAB — HEPARIN LEVEL (UNFRACTIONATED): Heparin Unfractionated: 0.31 IU/mL (ref 0.30–0.70)

## 2018-03-31 MED ORDER — ENOXAPARIN SODIUM 100 MG/ML ~~LOC~~ SOLN
1.0000 mg/kg | Freq: Two times a day (BID) | SUBCUTANEOUS | Status: DC
  Administered 2018-03-31 – 2018-04-01 (×3): 95 mg via SUBCUTANEOUS
  Filled 2018-03-31 (×5): qty 1

## 2018-03-31 NOTE — Progress Notes (Signed)
Still up in chair, tol well. Son at bedside.  She's required Lortab for pain in RUE at antecubital bruise site.

## 2018-03-31 NOTE — Progress Notes (Signed)
[place] for warfarin dosing  Indication: DVT  Allergies  Allergen Reactions  . Penicillins Shortness Of Breath, Rash and Other (See Comments)    Has patient had a PCN reaction causing immediate rash, facial/tongue/throat swelling, SOB or lightheadedness with hypotension: Yes Has patient had a PCN reaction causing severe rash involving mucus membranes or skin necrosis: No Has patient had a PCN reaction that required hospitalization No Has patient had a PCN reaction occurring within the last 10 years: No If all of the above answers are "NO", then may proceed with Cephalosporin use.  . Sulfa Antibiotics Shortness Of Breath, Rash and Other (See Comments)  . Amitiza [Lubiprostone]     N/v/d  . Aspirin Other (See Comments)    Reaction:  Unknown  Other reaction(s): Bleeding (intolerance) Per patient " causes nose to bleed" Can take 81 mg daily without any complications Other reaction(s): "bloody nose"   . Penicillin G Other (See Comments)    Has patient had a PCN reaction causing immediate rash, facial/tongue/throat swelling, SOB or lightheadedness with hypotension: No Has patient had a PCN reaction causing severe rash involving mucus membranes or skin necrosis: Unknown Has patient had a PCN reaction that required hospitalization: Unknown Has patient had a PCN reaction occurring within the last 10 years: Unknown If all of the above answers are "NO", then may proceed with Cephalosporin use.    Patient Measurements: Height: 5\' 9"  (175.3 cm) Weight: 207 lb (93.9 kg) IBW/kg (Calculated) : 66.2 Heparin Dosing Weight: 86.1kg  Vital Signs: Temp: 98.8 F (37.1 C) (04/28 0530) Temp Source: Oral (04/28 0530) BP: 124/63 (04/28 0530) Pulse Rate: 72 (04/28 0530)  Labs: Recent Labs    03/29/18 0118  03/30/18 0009 03/30/18 0745 03/31/18 0458  HGB 11.5*  --   --  10.3* 9.9*  HCT 34.2*  --   --  30.7* 29.3*  PLT 187  --   --  188 205  LABPROT  --    --   --   --  13.7  INR  --   --   --   --  1.06  HEPARINUNFRC 0.73*   < > 0.43 0.44 0.31  CREATININE  --   --   --  1.28*  --    < > = values in this interval not displayed.    Estimated Creatinine Clearance: 36.4 mL/min (A) (by C-G formula based on SCr of 1.28 mg/dL (H)).   Medical History: Past Medical History:  Diagnosis Date  . Acoustic neuroma ([place])   . Allergy   . Asthma   . Bilateral swelling of feet    and legs  . Bladder infection   . CAD (coronary artery disease)   . Cataract   . Change in voice   . Compression fracture of body of thoracic vertebra (HCC)    T12 09/18/15 MRI s/p fall   . Constipation   . COPD (chronic obstructive pulmonary disease) ([place])    previous CXR with chronic interstitial lung dz   . CVA (cerebral vascular accident) ([place])   . Depression   . Diabetes ([place])    with neuropathy  . Diabetes mellitus, type 2 ([place])   . Diarrhea   . Double vision   . DVT (deep venous thrombosis) ([place])    right leg 10/2015 was on coumadin off as of 2017/2018 ; s/p IVC filter  . Enuresis   . Eye pain, right   . Fall   . Fatty  liver    09/15/15 also mildly dilated pancreatitic duct rec MRCP small sub cm cyst hemangioma speeln mild right hydronephrorossi and prox. hydroureter, kidney stones, mild scarring kidneys  . Female stress incontinence   . Flank pain   . GERD (gastroesophageal reflux disease)    with small hiatal hernia   . Hard of hearing   . Heart disease   . History of kidney problems   . Hyperlipidemia    mixed  . Hypertension   . Hypothyroidism, postsurgical   . Impaired mobility and ADLs    uses rolling walker has caretaker 24/7 at home  . Leg edema   . Mixed incontinence urge and stress (female)(female)   . Neuropathy   . Osteoarthritis    DDD spine   . Osteoporosis with fracture    T12 compression fracture  . Photophobia   . Pulmonary embolism ([place])    10/2015 off coumadin as of 04/2016  . Pulmonary HTN (HCC)    mild pulm HTN, echo  10/09/15 EF 42-59%DGLOV 1 dd, RV systolic pressure increased   . Recurrent UTI   . Sinus pressure   . Skin cancer    BCC jawline and scalp   . Thyroid disease    follows [facility]  . TIA (transient ischemic attack)    MRI 2009/2010 neg stroke   . Trigeminal neuralgia    Dr. Tomi Bamberger s/p gamma knife x 2, on Tegretol since 2011/2012 no increase in dose >200 mg bid rec per family per neurology   . Urinary frequency   . Urinary, incontinence, stress female    Dr Erlene Quan urology     Medications:  Scheduled:  . aspirin EC  81 mg Oral Daily  . atorvastatin  40 mg Oral q1800  . calcium-vitamin D  1 tablet Oral Daily  . carbamazepine  200 mg Oral BID  . docusate sodium  100 mg Oral BID  . enoxaparin (LOVENOX) injection  1 mg/kg Subcutaneous BID  . feeding supplement (ENSURE ENLIVE)  1 Bottle Oral BID  . feeding supplement (PRO-STAT SUGAR FREE 64)  30 mL Oral BID BM  . fluticasone  1 spray Each Nare Daily  . gabapentin  100 mg Oral BID  . guaiFENesin  600 mg Oral Daily  . insulin aspart  0-15 Units Subcutaneous TID WC  . insulin aspart  0-5 Units Subcutaneous QHS  . levothyroxine  200 mcg Oral QAC breakfast  . midodrine  10 mg Oral TID WC  . mometasone-formoterol  2 puff Inhalation BID  . multivitamin with minerals  1 tablet Oral Daily  . pantoprazole  40 mg Oral Daily  . polyvinyl alcohol  1 drop Both Eyes QHS  . sodium chloride flush  3 mL Intravenous Q12H  . tiotropium  18 mcg Inhalation Daily  . warfarin  7.5 mg Oral q1800  . Warfarin - Pharmacist Dosing Inpatient   Does not apply q1800   Infusions:  . sodium chloride      Assessment: Pharmacy consulted for oral anticoagulation management for 84 yo female admitted with DVT.    Patient on carbamazepine as an outpatient. Patient will need warfarin therapy due to interaction with DOACs and carbamazepine. Patient has taken warfarin in the past.    Warfarin started on 4/27 along with heparin drip. Heparin drip  discontinued today 4/28 . Started Lovenox 1mg /kg Q12H.    4/28   INR = 1.06, Warfarin 7.5 mg   Goal of Therapy:  INR 2-3  Plan:  Will continue warfarin 7.5mg  PO daily. Patient will need bridging for at least 5 days and two consecutive INRs.   Will obtain INR with am labs.   Pharmacy will continue to monitor and adjust per consult.    Olivia Canter, [facility] 03/31/2018

## 2018-03-31 NOTE — Progress Notes (Signed)
[facility] NAME: Jessica Erickson    MR#:  [id number]  DATE OF BIRTH:  1926/04/07  SUBJECTIVE:   She is complaining of her right arm pain. She has some bruises secondary to blood draws which is hurting.ice pack provided helping her.   REVIEW OF SYSTEMS:   Review of Systems  Constitutional: Negative for chills, fever and weight loss.  HENT: Negative for ear discharge, ear pain and nosebleeds.   Eyes: Negative for blurred vision, pain and discharge.  Respiratory: Negative for sputum production, shortness of breath, wheezing and stridor.   Cardiovascular: Negative for chest pain, palpitations, orthopnea and PND.  Gastrointestinal: Negative for abdominal pain, diarrhea, nausea and vomiting.  Genitourinary: Negative for frequency and urgency.  Musculoskeletal: Positive for joint pain. Negative for back pain.  Neurological: Positive for weakness. Negative for sensory change, speech change and focal weakness.  Psychiatric/Behavioral: Negative for depression and hallucinations. The patient is not nervous/anxious.    Tolerating Diet:yes Tolerating PT: pending  DRUG ALLERGIES:   Allergies  Allergen Reactions  . Penicillins Shortness Of Breath, Rash and Other (See Comments)    Has patient had a PCN reaction causing immediate rash, facial/tongue/throat swelling, SOB or lightheadedness with hypotension: Yes Has patient had a PCN reaction causing severe rash involving mucus membranes or skin necrosis: No Has patient had a PCN reaction that required hospitalization No Has patient had a PCN reaction occurring within the last 10 years: No If all of the above answers are "NO", then may proceed with Cephalosporin use.  . Sulfa Antibiotics Shortness Of Breath, Rash and Other (See Comments)  . Amitiza [Lubiprostone]     N/v/d  . Aspirin Other (See Comments)    Reaction:  Unknown  Other reaction(s): Bleeding (intolerance) Per patient "  causes nose to bleed" Can take 81 mg daily without any complications Other reaction(s): "bloody nose"   . Penicillin G Other (See Comments)    Has patient had a PCN reaction causing immediate rash, facial/tongue/throat swelling, SOB or lightheadedness with hypotension: No Has patient had a PCN reaction causing severe rash involving mucus membranes or skin necrosis: Unknown Has patient had a PCN reaction that required hospitalization: Unknown Has patient had a PCN reaction occurring within the last 10 years: Unknown If all of the above answers are "NO", then may proceed with Cephalosporin use.    VITALS:  Blood pressure 124/63, pulse 72, temperature 98.8 F (37.1 C), temperature source Oral, resp. rate 20, height 5\' 9"  (1.753 m), weight 93.9 kg (207 lb), SpO2 94 %.  PHYSICAL EXAMINATION:   Physical Exam  GENERAL:  84 y.o.-year-old patient lying in the bed with no acute distress.  EYES: Pupils equal, round, reactive to light and accommodation. No scleral icterus. Extraocular muscles intact.  HEENT: Head atraumatic, normocephalic. Oropharynx and nasopharynx clear.  NECK:  Supple, no jugular venous distention. No thyroid enlargement, no tenderness.  LUNGS: Normal breath sounds bilaterally, no wheezing, rales, rhonchi. No use of accessory muscles of respiration.  CARDIOVASCULAR: S1, S2 normal. No murmurs, rubs, or gallops.  ABDOMEN: Soft, nontender, nondistended. Bowel sounds present. No organomegaly or mass.  EXTREMITIES: bruise over the right elbow. Mild swelling. Tender to touch. No cellulitis no cyanosis, clubbing  -bilateral lower extremity edema + NEUROLOGIC: Cranial nerves II through XII are intact. No focal Motor or sensory deficits b/l.   PSYCHIATRIC:  patient is alert and oriented x 3.  SKIN: No obvious rash, lesion, or ulcer.  LABORATORY PANEL:  CBC Recent Labs  Lab 03/31/18 0458  WBC 7.0  HGB 9.9*  HCT 29.3*  PLT 205    Chemistries  Recent Labs  Lab  03/27/18 1815  03/30/18 0745  NA 139   < > 139  K 4.0   < > 3.9  CL 100*   < > 104  CO2 32   < > 29  GLUCOSE 113*   < > 106*  BUN 20   < > 16  CREATININE 1.15*   < > 1.28*  CALCIUM 8.5*   < > 7.8*  AST 25  --   --   ALT 13*  --   --   ALKPHOS 81  --   --   BILITOT 0.3  --   --    < > = values in this interval not displayed.   Cardiac Enzymes Recent Labs  Lab 03/27/18 1815  TROPONINI <0.03   RADIOLOGY:  No results found. ASSESSMENT AND PLAN:  84 year old elderly female patient with history of DVT in the lower extremity, pulmonary embolism, coronary artery disease, CVA, type 2 diabetes mellitus, hypertension was referred by her physician for extensive DVT in the lower extremity.  * Bilateral lower extremity DVT IV heparin drip for anticoagulation---add oral anticoagulation. Patient was on warfarin previously for her history of DVT and pulmonary embolism per her son -will do lovenox + warfarin -Appreicate Vascular surgery consultation for bilateral thrombectomy -initiate physical therapy  * Diabetes mellitus type 2 Resume diabetic medication with sliding scale coverage with insulin  * Coronary artery disease Resume aspirin and statin  * Hypertension Medical management  *Right elbow bruise-PRN pain meds and ice pack  PT to start working with her   Case discussed with Care Management/Social Worker. Management plans discussed with the patient, family and they are in agreement.  CODE STATUS: full  DVT Prophylaxis: coumadin TOTAL TIME TAKING CARE OF THIS PATIENT: 30 minutes.  >50% time spent on counselling and coordination of care  POSSIBLE D/C IN 2-3 DAYS, DEPENDING ON CLINICAL CONDITION.  Note: This dictation was prepared with Dragon dictation along with smaller phrase technology. Any transcriptional errors that result from this process are unintentional.  Fritzi Mandes M.D on 03/31/2018 at 1:51 PM  Between 7am to 6pm - Pager - [phone]  After 6pm go to  [email or web address] - password [email or web address] [facility]  Office  [phone]  CC: Primary care physician; McLean-Scocuzza, Nino Glow, MDPatient ID: Jessica Erickson, female   DOB: 07-03-1926, 84 y.o.   MRN: [id number]

## 2018-03-31 NOTE — Progress Notes (Signed)
PT got pt up to chair, she sat there until after supper, her RUE antecubital bruise was lighter in color this morn but now it's a little darker and outside the lines drawn 2 days ago-I marked it w/ink. Propped arm on extra pillow and put ice to it. Will con't to monitor. I couldn't mark the most medial/and back part of the bruise-it was too painfuil for her to let me manipulate it.

## 2018-03-31 NOTE — Progress Notes (Signed)
Miralax given for constipation, she says she needs miralax daily. No record of last BM.

## 2018-03-31 NOTE — NC FL2 (Addendum)
[place] LEVEL OF CARE SCREENING TOOL     IDENTIFICATION  Patient Name: Jessica Erickson Birthdate: 06-27-1926 Sex: female Admission Date (Current Location): 03/27/2018  [place] and [place] Number:  Engineering geologist and Address:  [facility], [address], [place], [place] [address]      Provider Number: [id number]  Attending Physician Name and Address:  Fritzi Mandes, MD  Relative Name and Phone Number:  Magen Suriano (son) [phone]    Current Level of Care: Hospital Recommended Level of Care: [place] Prior Approval Number:    Date Approved/Denied:   PASRR Number:    Discharge Plan: Domiciliary (Rest home)    Current Diagnoses: Patient Active Problem List   Diagnosis Date Noted  . DVT (deep vein thrombosis) in pregnancy ([place]) 03/27/2018  . Moderate protein-calorie malnutrition ([place]) 03/22/2018  . Orthostasis 03/05/2018  . Dizziness 03/04/2018  . COPD (chronic obstructive pulmonary disease) ([place]) 12/13/2017  . Anxiety and depression 12/13/2017  . Trigeminal neuralgia 12/13/2017  . GERD (gastroesophageal reflux disease) 12/13/2017  . Diabetes mellitus type 2, controlled ([place]) 12/13/2017  . Constipation 12/13/2017  . Insomnia 12/13/2017  . Hypothyroidism 12/13/2017  . UTI (urinary tract infection) 10/08/2015  . Collapsed vertebra, not elsewhere classified, thoracic region, initial encounter for fracture ([place]) 09/20/2015  . Basal cell carcinoma of scalp 04/06/2014  . Fothergill's neuralgia 08/05/2013  . Bladder infection, chronic 02/11/2013  . Female genuine stress incontinence 02/11/2013  . Incomplete bladder emptying 02/11/2013  . Intrinsic sphincter deficiency 02/11/2013  . Mixed incontinence 02/11/2013  . Excessive urination at night 02/11/2013  . Bladder retention 02/11/2013  . FOM (frequency of micturition) 02/11/2013  . Basal cell carcinoma of face 09/13/2011    Orientation RESPIRATION BLADDER  Height & Weight     Self, Place, Situation  Normal Continent Weight: 207 lb (93.9 kg) Height:  5\' 9"  (175.3 cm)  BEHAVIORAL SYMPTOMS/MOOD NEUROLOGICAL BOWEL NUTRITION STATUS      Continent    AMBULATORY STATUS COMMUNICATION OF NEEDS Skin   Supervision Verbally Bruising                       Personal Care Assistance Level of Assistance  Bathing, Feeding, Dressing Bathing Assistance: Independent Feeding assistance: Independent Dressing Assistance: Independent     Functional Limitations Info    Sight Info: Adequate Hearing Info: Impaired Speech Info: Adequate    SPECIAL CARE FACTORS FREQUENCY  PT (By licensed PT)     PT Frequency: Home health X3 per week              Contractures Contractures Info: Not present    Additional Factors Info  Code Status Code Status Info: Full Allergies Info: Penicillins, Sulfa Antibiotics, Amitiza Lubiprostone, Aspirin, Penicillin G           Current Medications (03/31/2018):  This is the current hospital active medication list Current Facility-Administered Medications  Medication Dose Route Frequency Provider Last Rate Last Dose  . 0.9 %  sodium chloride infusion  250 mL Intravenous PRN Algernon Huxley, MD      . acetaminophen (TYLENOL) tablet 650 mg  650 mg Oral Q6H PRN Algernon Huxley, MD   650 mg at 03/30/18 1546   Or  . acetaminophen (TYLENOL) suppository 650 mg  650 mg Rectal Q6H PRN Algernon Huxley, MD      . aspirin EC tablet 81 mg  81 mg Oral Daily Dew, Erskine Squibb, MD  81 mg at 03/31/18 1116  . atorvastatin (LIPITOR) tablet 40 mg  40 mg Oral q1800 Algernon Huxley, MD   40 mg at 03/30/18 1725  . bisacodyl (DULCOLAX) EC tablet 5 mg  5 mg Oral Daily PRN Algernon Huxley, MD   5 mg at 03/31/18 1116  . calcium-vitamin D (OSCAL WITH D) 500-200 MG-UNIT per tablet 1 tablet  1 tablet Oral Daily Algernon Huxley, MD   1 tablet at 03/31/18 1116  . carbamazepine (TEGRETOL) tablet 200 mg  200 mg Oral BID Algernon Huxley, MD   200 mg at 03/31/18 0801  .  docusate sodium (COLACE) capsule 100 mg  100 mg Oral BID Algernon Huxley, MD   100 mg at 03/31/18 1117  . enoxaparin (LOVENOX) injection 95 mg  1 mg/kg Subcutaneous BID Fritzi Mandes, MD   95 mg at 03/31/18 1438  . feeding supplement (ENSURE ENLIVE) (ENSURE ENLIVE) liquid 237 mL  1 Bottle Oral BID Fritzi Mandes, MD      . feeding supplement (PRO-STAT SUGAR FREE 64) liquid 30 mL  30 mL Oral BID BM Algernon Huxley, MD   30 mL at 03/31/18 1611  . fluticasone (FLONASE) 50 MCG/ACT nasal spray 1 spray  1 spray Each Nare Daily Algernon Huxley, MD   1 spray at 03/31/18 1119  . gabapentin (NEURONTIN) capsule 100 mg  100 mg Oral BID Algernon Huxley, MD   100 mg at 03/31/18 1115  . guaiFENesin (MUCINEX) 12 hr tablet 600 mg  600 mg Oral Daily Algernon Huxley, MD   600 mg at 03/31/18 1117  . HYDROcodone-acetaminophen (NORCO/VICODIN) 5-325 MG per tablet 1-2 tablet  1-2 tablet Oral Q6H PRN Fritzi Mandes, MD   1 tablet at 03/31/18 1610  . insulin aspart (novoLOG) injection 0-15 Units  0-15 Units Subcutaneous TID WC Algernon Huxley, MD   2 Units at 03/31/18 1230  . insulin aspart (novoLOG) injection 0-5 Units  0-5 Units Subcutaneous QHS Algernon Huxley, MD      . levothyroxine (SYNTHROID, LEVOTHROID) tablet 200 mcg  200 mcg Oral QAC breakfast Algernon Huxley, MD   200 mcg at 03/31/18 [id number]  . midodrine (PROAMATINE) tablet 10 mg  10 mg Oral TID WC Algernon Huxley, MD   10 mg at 03/31/18 1114  . mometasone-formoterol (DULERA) 200-5 MCG/ACT inhaler 2 puff  2 puff Inhalation BID Algernon Huxley, MD   2 puff at 03/31/18 0803  . multivitamin with minerals tablet 1 tablet  1 tablet Oral Daily Algernon Huxley, MD   1 tablet at 03/31/18 1115  . ondansetron (ZOFRAN) tablet 4 mg  4 mg Oral Q6H PRN Algernon Huxley, MD       Or  . ondansetron (ZOFRAN) injection 4 mg  4 mg Intravenous Q6H PRN Algernon Huxley, MD      . pantoprazole (PROTONIX) EC tablet 40 mg  40 mg Oral Daily Algernon Huxley, MD   40 mg at 03/31/18 1117  . polyethylene glycol (MIRALAX / GLYCOLAX) packet 17 g   17 g Oral Daily PRN Algernon Huxley, MD   17 g at 03/30/18 1249  . polyvinyl alcohol (LIQUIFILM TEARS) 1.4 % ophthalmic solution 1 drop  1 drop Both Eyes QHS Algernon Huxley, MD   1 drop at 03/30/18 2229  . sodium chloride flush (NS) 0.9 % injection 3 mL  3 mL Intravenous Q12H Algernon Huxley, MD   3 mL at 03/31/18  1000  . sodium chloride flush (NS) 0.9 % injection 3 mL  3 mL Intravenous PRN Algernon Huxley, MD      . tiotropium [facility]) inhalation capsule 18 mcg  18 mcg Inhalation Daily Algernon Huxley, MD   18 mcg at 03/31/18 1000  . traZODone (DESYREL) tablet 25 mg  25 mg Oral QHS PRN Algernon Huxley, MD   25 mg at 03/30/18 2228  . warfarin (COUMADIN) tablet 7.5 mg  7.5 mg Oral q1800 Fritzi Mandes, MD   7.5 mg at 03/30/18 1725  . Warfarin - Pharmacist Dosing Inpatient   Does not apply Q2297 Fritzi Mandes, MD         Discharge Medications: STOP taking these medications   magnesium hydroxide 400 MG/5ML suspension Commonly known as:  MILK OF MAGNESIA   metFORMIN 500 MG 24 hr tablet Commonly known as:  GLUCOPHAGE-XR   Polyethyl Glycol-Propyl Glycol 0.4-0.3 % Soln     TAKE these medications   acetaminophen 325 MG tablet Commonly known as:  TYLENOL Take 650 mg by mouth every 6 (six) hours as needed.   aspirin EC 81 MG tablet Take 81 mg by mouth daily.   atorvastatin 40 MG tablet Commonly known as:  LIPITOR Take 40 mg by mouth daily at 6 PM.   bisacodyl 5 MG EC tablet Commonly known as:  DULCOLAX Take 1 tablet (5 mg total) by mouth daily as needed for moderate constipation.   budesonide-formoterol 160-4.5 MCG/ACT inhaler Commonly known as:  SYMBICORT Inhale 2 puffs into the lungs 2 (two) times daily.   CALCIUM 600+D 600-400 MG-UNIT tablet Generic drug:  Calcium Carbonate-Vitamin D Take 1 tablet by mouth daily.   carbamazepine 200 MG tablet Commonly known as:  TEGRETOL Take 1 tablet (200 mg total) by mouth 2 (two) times daily.   docusate sodium 100 MG capsule Commonly known as:   COLACE Take 1 capsule (100 mg total) by mouth 2 (two) times daily.   enoxaparin 100 MG/ML injection Commonly known as:  LOVENOX Inject 0.95 mLs (95 mg total) into the skin 2 (two) times daily.   ENSURE ENLIVE PO Take 1 Bottle by mouth 2 (two) times daily.   feeding supplement (PRO-STAT SUGAR FREE 64) Liqd Take 30 mLs by mouth 2 (two) times daily between meals.   fluticasone 50 MCG/ACT nasal spray Commonly known as:  FLONASE Place 1 spray into both nostrils daily.   furosemide 40 MG tablet Commonly known as:  LASIX Take 1 tablet (40 mg total) by mouth daily.   gabapentin 100 MG capsule Commonly known as:  NEURONTIN Take 100 mg by mouth 2 (two) times daily.   guaiFENesin 600 MG 12 hr tablet Commonly known as:  MUCINEX Take 600 mg by mouth daily.   HYDROcodone-acetaminophen 5-325 MG tablet Commonly known as:  NORCO/VICODIN Take 1 tablet by mouth every 6 (six) hours as needed for moderate pain or severe pain.   insulin regular 100 units/mL injection Commonly known as:  NOVOLIN R,HUMULIN R Inject 0-12 Units into the skin 3 (three) times daily before meals. 100 unit/mL; amt: Per Sliding Scale; If Blood Sugar is 176 to 250, give 3 Units.If Blood Sugar is 251 to 325, give 6 Units.If Blood Sugar is 326 to 450, give 10 Units. If Blood Sugar is greater than 450, give 12 Units and recheck in 1 hour. Notify MD/NP if >450 after recheck. Check blood sugar AC & HS   levothyroxine 200 MCG tablet Commonly known as:  SYNTHROID, LEVOTHROID Take  200 mcg by mouth daily before breakfast. Except on Sunday. Do not take with other medications   lidocaine 5 % Commonly known as:  LIDODERM Place 1 patch onto the skin daily. Remove & Discard patch within 12 hours or as directed by MD   midodrine 10 MG tablet Commonly known as:  PROAMATINE Take 1 tablet (10 mg total) by mouth 3 (three) times daily with meals.   multivitamin with minerals Tabs tablet Take 1 tablet by mouth daily.    pantoprazole 40 MG tablet Commonly known as:  PROTONIX Take 1 tablet (40 mg total) by mouth daily.   polyethylene glycol powder powder Commonly known as:  GLYCOLAX/MIRALAX Take 17 g by mouth daily as needed for mild constipation.   tiotropium 18 MCG inhalation capsule Commonly known as:  SPIRIVA Place 1 capsule (18 mcg total) into inhaler and inhale daily.   traZODone 50 MG tablet Commonly known as:  DESYREL Take 0.5 tablets (25 mg total) by mouth at bedtime as needed for sleep.   warfarin 7.5 MG tablet Commonly known as:  COUMADIN Take 1 tablet (7.5 mg total) by mouth daily at 6 PM.      Relevant Imaging Results:  Relevant Lab Results:   Additional Information SS# [id number]  Zettie Pho, LCSW

## 2018-03-31 NOTE — Evaluation (Signed)
Physical Therapy Evaluation Patient Details Name: Jessica Erickson MRN: [id number] DOB: 1926-07-06 Today's Date: 03/31/2018   History of Present Illness  Pt is an 84 year old female admitted s/p thrombectomy.  PMH includes known history of DVT, pulmonary embolism, COPD, coronary artery disease, diabetes mellitus type 2, CVA  Clinical Impression  Pt is an 84 year old female who lives in a home alone with 24 hr assistance.  She reports being nonambulatory for the past 3 weeks since her recent hospitalization but that she was able to walk with a 4WW and ride a stationary bike prior.  Pt in bed and reports pain in R upper arm upon PT arrival.  She requires mod A for bed mobility as she is very apprehensive about WB on R UE.  Pt able to balance at EOB but with poor balance.  Pt presented with overall weakness of UE and LE and kyphotic posture.  She was able to complete supine there ex supine in bed without report of LE pain.  Pt required mod A for STS and PT assisted in decreased WB of RUE on RW.  Pt able to perform standing pivot transfer to recliner with min A for management of RW to avoid R UE pain.  She presented with labored breathing following transfer but O2 sats were WNL.  Pt will continue to benefit from skilled PT with focus on there ex to regain strength, bed mobility, safe transfers and use of AD and balance.    Follow Up Recommendations Home health PT    Equipment Recommendations  None recommended by PT    Recommendations for Other Services       Precautions / Restrictions Precautions Precautions: Fall Precaution Comments: High Fall Risk Restrictions Weight Bearing Restrictions: No      Mobility  Bed Mobility Overal bed mobility: Needs Assistance Bed Mobility: Supine to Sit     Supine to sit: Mod assist     General bed mobility comments: Pt able to initiate movement and scoot to EOB but requires assistance for upright posture and to avoid WB on R UE.  Transfers Overall  transfer level: Needs assistance Equipment used: Rolling walker (2 wheeled) Transfers: Sit to/from [facility] Sit to Stand: Mod assist Stand pivot transfers: Min assist       General transfer comment: Able to balance with RW once standing.  PT offered mod A for initiation of transfer and assisted pt with avoidance of WB on R UE.  Pt able to transfer with min A for management of RW and control descent into chair until the last 70-90 degrees of knee flexion.  Ambulation/Gait Ambulation/Gait assistance: (Did not perform.  Unable to assess.)              Stairs            Wheelchair Mobility    Modified [place] (Stroke Patients Only)       Balance Overall balance assessment: Needs assistance Sitting-balance support: Feet supported;Single extremity supported Sitting balance-Leahy Scale: Fair   Postural control: Posterior lean Standing balance support: Bilateral upper extremity supported Standing balance-Leahy Scale: Fair                               Pertinent Vitals/Pain Pain Assessment: Faces Faces Pain Scale: Hurts whole lot Pain Location: R antecubital and upper arm Pain Intervention(s): Limited activity within patient's tolerance;Monitored during session    Home Living Family/patient expects to  be discharged to:: Assisted living Living Arrangements: Alone;Other (Comment)(24 hour caregiver) Available Help at Discharge: Family;Personal care attendant;Available 24 hours/day(has two caregivers that split time to provide 24/7 sup/assist) Type of Home: Apartment Home Access: Level entry     Home Layout: One level Home Equipment: Walker - 4 wheels;Shower seat      Prior Function Level of Independence: Needs assistance   Gait / Transfers Assistance Needed: Pt states that she has not been able to walk since hospitalization 3 wks ago.  She has been receiving assistance from personal care attendant to transfer from bed to [facility] each day  but can no longer ride her stationary bike or walk in home with 4WW.  ADL's / Homemaking Assistance Needed: Receives assistance with bathing and dressing        Hand Dominance        Extremity/Trunk Assessment   Upper Extremity Assessment Upper Extremity Assessment: Generalized weakness    Lower Extremity Assessment Lower Extremity Assessment: Generalized weakness    Cervical / Trunk Assessment Cervical / Trunk Assessment: Kyphotic  Communication   Communication: HOH  Cognition Arousal/Alertness: Awake/alert Behavior During Therapy: WFL for tasks assessed/performed Overall Cognitive Status: Within Functional Limits for tasks assessed                                        General Comments      Exercises General Exercises - Lower Extremity Ankle Circles/Pumps: AROM;Both;20 reps;Supine Heel Slides: Strengthening;Both;10 reps;Supine Straight Leg Raises: Strengthening;Both;5 reps;Supine   Assessment/Plan    PT Assessment Patient needs continued PT services  PT Problem List Decreased strength;Decreased mobility;Decreased balance;Decreased activity tolerance       PT Treatment Interventions DME instruction;Therapeutic activities;Gait training;Therapeutic exercise;Patient/family education;Balance training;Functional mobility training    PT Goals (Current goals can be found in the Care Plan section)  Acute Rehab PT Goals Patient Stated Goal: To regain strength and be able to walk and exercise again. PT Goal Formulation: With patient Time For Goal Achievement: 04/21/18 Potential to Achieve Goals: Good    Frequency Min 2X/week   Barriers to discharge        Co-evaluation               AM-PAC PT "6 Clicks" Daily Activity  Outcome Measure Difficulty turning over in bed (including adjusting bedclothes, sheets and blankets)?: A Lot Difficulty moving from lying on back to sitting on the side of the bed? : A Lot Difficulty sitting down on and  standing up from a chair with arms (e.g., wheelchair, bedside commode, etc,.)?: A Lot Help needed moving to and from a bed to chair (including a wheelchair)?: A Lot Help needed walking in hospital room?: A Lot Help needed climbing 3-5 steps with a railing? : Total 6 Click Score: 11    End of Session Equipment Utilized During Treatment: Gait belt Activity Tolerance: Patient limited by fatigue Patient left: in chair;with call bell/phone within reach;with chair alarm set Nurse Communication: Mobility status PT Visit Diagnosis: Unsteadiness on feet (R26.81);Muscle weakness (generalized) (M62.81)    Time: 6962-9528 PT Time Calculation (min) (ACUTE ONLY): 25 min   Charges:   PT Evaluation $PT Eval Moderate Complexity: 1 Mod PT Treatments $Therapeutic Exercise: 8-22 mins   PT G Codes:   PT G-Codes **NOT FOR INPATIENT CLASS** Functional Assessment Tool Used: AM-PAC 6 Clicks Basic Mobility    Roxanne Gates, PT, DPT   Roxanne Gates  03/31/2018, 3:39 PM

## 2018-03-31 NOTE — Clinical Social Work Note (Signed)
Clinical Social Work Assessment  Patient Details  Name: Jessica Erickson MRN: [id number] Date of Birth: 01-23-1926  Date of referral:  03/31/18               Reason for consult:  Facility Placement                Permission sought to share information with:  Chartered certified accountant granted to share information::  Yes, Verbal Permission Granted  Name::        Agency::  [facility]  Relationship::     Contact Information:     Housing/Transportation Living arrangements for the past 2 months:  [place] of Information:  Patient, Medical Team Patient Interpreter Needed:  None Criminal Activity/Legal Involvement Pertinent to Current Situation/Hospitalization:  No - Comment as needed Significant Relationships:  Adult Children, Community Support Lives with:  Facility Resident Do you feel safe going back to the place where you live?  Yes Need for family participation in patient care:  No (Coment)  Care giving concerns:  Patient admitted from [place]   Social Worker assessment / plan:  The CSW met with the patient at bedside. The patient was [facility] Ericks[facility]; however, she was alert and oriented X 3 and could understand the CSW with raised voice. The patient confirmed that she is a resident at [facility] and would like to return.   PT is recommending HHPT at the facility. The patient will most likely need a facility assessment prior to discharge as she has been out of the facility for over 48 hours. The CSW will coordinate with the facility during regular business hours and will facilitate discharge when appropriate.  Employment status:  Retired Nurse, adult PT Recommendations:  Home with [place] / Referral to community resources:     Patient/Family's Response to care: The patient thanked the CSW.  Patient/Family's Understanding of and Emotional Response to Diagnosis, Current Treatment, and Prognosis: The patient  understands that she will return to her ALF and is in agreement.  Emotional Assessment Appearance:  Appears stated age Attitude/Demeanor/Rapport:  Engaged, Gracious Affect (typically observed):  Accepting, Pleasant Orientation:  Oriented to Self, Oriented to Place, Oriented to Situation Alcohol / Substance use:  Never Used Psych involvement (Current and /or in the community):  No (Comment)  Discharge Needs  Concerns to be addressed:  Care Coordination, Discharge Planning Concerns Readmission within the last 30 days:  Yes Current discharge risk:  Chronically ill Barriers to Discharge:  Continued Medical Work up   [employer], LCSW 03/31/2018, 4:51 PM

## 2018-03-31 NOTE — Progress Notes (Signed)
[place] for heparin drip Indication: DVT  Allergies  Allergen Reactions  . Penicillins Shortness Of Breath, Rash and Other (See Comments)    Has patient had a PCN reaction causing immediate rash, facial/tongue/throat swelling, SOB or lightheadedness with hypotension: Yes Has patient had a PCN reaction causing severe rash involving mucus membranes or skin necrosis: No Has patient had a PCN reaction that required hospitalization No Has patient had a PCN reaction occurring within the last 10 years: No If all of the above answers are "NO", then may proceed with Cephalosporin use.  . Sulfa Antibiotics Shortness Of Breath, Rash and Other (See Comments)  . Amitiza [Lubiprostone]     N/v/d  . Aspirin Other (See Comments)    Reaction:  Unknown  Other reaction(s): Bleeding (intolerance) Per patient " causes nose to bleed" Can take 81 mg daily without any complications Other reaction(s): "bloody nose"   . Penicillin G Other (See Comments)    Has patient had a PCN reaction causing immediate rash, facial/tongue/throat swelling, SOB or lightheadedness with hypotension: No Has patient had a PCN reaction causing severe rash involving mucus membranes or skin necrosis: Unknown Has patient had a PCN reaction that required hospitalization: Unknown Has patient had a PCN reaction occurring within the last 10 years: Unknown If all of the above answers are "NO", then may proceed with Cephalosporin use.    Patient Measurements: Height: 5\' 9"  (175.3 cm) Weight: 207 lb (93.9 kg) IBW/kg (Calculated) : 66.2 Heparin Dosing Weight: 86.1kg  Vital Signs: Temp: 98.8 F (37.1 C) (04/28 0530) Temp Source: Oral (04/28 0530) BP: 124/63 (04/28 0530) Pulse Rate: 72 (04/28 0530)  Labs: Recent Labs    03/29/18 0118  03/30/18 0009 03/30/18 0745 03/31/18 0458  HGB 11.5*  --   --  10.3* 9.9*  HCT 34.2*  --   --  30.7* 29.3*  PLT 187  --   --  188 205  LABPROT  --   --    --   --  13.7  INR  --   --   --   --  1.06  HEPARINUNFRC 0.73*   < > 0.43 0.44 0.31  CREATININE  --   --   --  1.28*  --    < > = values in this interval not displayed.    Estimated Creatinine Clearance: 36.4 mL/min (A) (by C-G formula based on SCr of 1.28 mg/dL (H)).   Medical History: Past Medical History:  Diagnosis Date  . Acoustic neuroma ([place])   . Allergy   . Asthma   . Bilateral swelling of feet    and legs  . Bladder infection   . CAD (coronary artery disease)   . Cataract   . Change in voice   . Compression fracture of body of thoracic vertebra (HCC)    T12 09/18/15 MRI s/p fall   . Constipation   . COPD (chronic obstructive pulmonary disease) ([place])    previous CXR with chronic interstitial lung dz   . CVA (cerebral vascular accident) ([place])   . Depression   . Diabetes ([place])    with neuropathy  . Diabetes mellitus, type 2 ([place])   . Diarrhea   . Double vision   . DVT (deep venous thrombosis) ([place])    right leg 10/2015 was on coumadin off as of 2017/2018 ; s/p IVC filter  . Enuresis   . Eye pain, right   . Fall   . Fatty liver  09/15/15 also mildly dilated pancreatitic duct rec MRCP small sub cm cyst hemangioma speeln mild right hydronephrorossi and prox. hydroureter, kidney stones, mild scarring kidneys  . Female stress incontinence   . Flank pain   . GERD (gastroesophageal reflux disease)    with small hiatal hernia   . Hard of hearing   . Heart disease   . History of kidney problems   . Hyperlipidemia    mixed  . Hypertension   . Hypothyroidism, postsurgical   . Impaired mobility and ADLs    uses rolling walker has caretaker 24/7 at home  . Leg edema   . Mixed incontinence urge and stress (female)(female)   . Neuropathy   . Osteoarthritis    DDD spine   . Osteoporosis with fracture    T12 compression fracture  . Photophobia   . Pulmonary embolism ([place])    10/2015 off coumadin as of 04/2016  . Pulmonary HTN (HCC)    mild pulm HTN, echo  10/09/15 EF 64-33%IRJJO 1 dd, RV systolic pressure increased   . Recurrent UTI   . Sinus pressure   . Skin cancer    BCC jawline and scalp   . Thyroid disease    follows [facility]  . TIA (transient ischemic attack)    MRI 2009/2010 neg stroke   . Trigeminal neuralgia    Dr. Tomi Bamberger s/p gamma knife x 2, on Tegretol since 2011/2012 no increase in dose >200 mg bid rec per family per neurology   . Urinary frequency   . Urinary, incontinence, stress female    Dr Erlene Quan urology     Medications:  Scheduled:  . aspirin EC  81 mg Oral Daily  . atorvastatin  40 mg Oral q1800  . calcium-vitamin D  1 tablet Oral Daily  . carbamazepine  200 mg Oral BID  . docusate sodium  100 mg Oral BID  . feeding supplement (ENSURE ENLIVE)  1 Bottle Oral BID  . feeding supplement (PRO-STAT SUGAR FREE 64)  30 mL Oral BID BM  . fluticasone  1 spray Each Nare Daily  . gabapentin  100 mg Oral BID  . guaiFENesin  600 mg Oral Daily  . insulin aspart  0-15 Units Subcutaneous TID WC  . insulin aspart  0-5 Units Subcutaneous QHS  . levothyroxine  200 mcg Oral QAC breakfast  . midodrine  10 mg Oral TID WC  . mometasone-formoterol  2 puff Inhalation BID  . multivitamin with minerals  1 tablet Oral Daily  . pantoprazole  40 mg Oral Daily  . polyvinyl alcohol  1 drop Both Eyes QHS  . sodium chloride flush  3 mL Intravenous Q12H  . tiotropium  18 mcg Inhalation Daily  . warfarin  7.5 mg Oral q1800  . Warfarin - Pharmacist Dosing Inpatient   Does not apply q1800   Infusions:  . sodium chloride    . heparin 850 Units/hr (03/30/18 1723)    Assessment: Pharmacy consulted for heparin drip management for 84 yo female admitted with DVT.   Patient is s/p thrombectomy. Dr. Lucky Cowboy wants to continue dosing heparin with normal goal and bolus if needed post-thrombectomy.   Patient currently receiving heparin drip at 850 units/hr.    Goal of Therapy:  Heparin level 0.3-0.7 units/ml Monitor platelets by  anticoagulation protocol: Yes   Plan:  Confirmatory level is in range. Will continue heparin at 850 units/hr and obtain follow up anti-Xa level with am labs.   04/28 @ 0500 HL 0.31  therapeutic, but trending down. Will increase rate slightly to 900 units/hr and will recheck next HL @ 1300.  Pharmacy will continue to monitor and adjust per consult.    Tobie Lords, PharmD, BCPS Clinical Pharmacist 03/31/2018

## 2018-04-01 ENCOUNTER — Encounter: Payer: Self-pay | Admitting: Vascular Surgery

## 2018-04-01 ENCOUNTER — Ambulatory Visit: Payer: Medicare Other | Admitting: Podiatry

## 2018-04-01 ENCOUNTER — Other Ambulatory Visit (INDEPENDENT_AMBULATORY_CARE_PROVIDER_SITE_OTHER): Payer: Self-pay | Admitting: Vascular Surgery

## 2018-04-01 ENCOUNTER — Telehealth: Payer: Self-pay

## 2018-04-01 LAB — GLUCOSE, CAPILLARY
Glucose-Capillary: 105 mg/dL — ABNORMAL HIGH (ref 65–99)
Glucose-Capillary: 135 mg/dL — ABNORMAL HIGH (ref 65–99)
Glucose-Capillary: 94 mg/dL (ref 65–99)

## 2018-04-01 LAB — PROTIME-INR
INR: 1.12
Prothrombin Time: 14.3 seconds (ref 11.4–15.2)

## 2018-04-01 LAB — MRSA PCR SCREENING: MRSA by PCR: NEGATIVE

## 2018-04-01 MED ORDER — LIDOCAINE 5 % EX PTCH
1.0000 | MEDICATED_PATCH | CUTANEOUS | Status: DC
  Administered 2018-04-01: 1 via TRANSDERMAL
  Filled 2018-04-01: qty 1

## 2018-04-01 MED ORDER — LIDOCAINE 5 % EX PTCH: 1.0000 | MEDICATED_PATCH | CUTANEOUS | 0 refills | Status: DC

## 2018-04-01 MED ORDER — WARFARIN SODIUM 7.5 MG PO TABS: 7.5000 mg | ORAL_TABLET | Freq: Every day | ORAL | 0 refills | Status: DC

## 2018-04-01 MED ORDER — ENOXAPARIN SODIUM 100 MG/ML ~~LOC~~ SOLN: 1.0000 mg/kg | Freq: Two times a day (BID) | SUBCUTANEOUS | 0 refills | Status: DC

## 2018-04-01 NOTE — Discharge Summary (Addendum)
[facility] [place] NAME: Jessica Erickson    MR#:  [id number]  DATE OF BIRTH:  1926/11/30  DATE OF ADMISSION:  03/27/2018 ADMITTING PHYSICIAN: Saundra Shelling, MD  DATE OF DISCHARGE: 4/292019  PRIMARY CARE PHYSICIAN: McLean-Scocuzza, Nino Glow, MD    ADMISSION DIAGNOSIS:  DVT, lower extremity, proximal, acute, bilateral (HCC) [I82.4Y3] Acute deep vein thrombosis (DVT) of popliteal vein of both lower extremities (HCC) [I82.433]  DISCHARGE DIAGNOSIS:  *bilateral extensive acute recurrent DVT status post mechanical thrombectomy and pharmaceutical thrombolysis *Right UE hematoma/bruise SECONDARY DIAGNOSIS:   Past Medical History:  Diagnosis Date  . Acoustic neuroma ([place])   . Allergy   . Asthma   . Bilateral swelling of feet    and legs  . Bladder infection   . CAD (coronary artery disease)   . Cataract   . Change in voice   . Compression fracture of body of thoracic vertebra (HCC)    T12 09/18/15 MRI s/p fall   . Constipation   . COPD (chronic obstructive pulmonary disease) ([place])    previous CXR with chronic interstitial lung dz   . CVA (cerebral vascular accident) ([place])   . Depression   . Diabetes ([place])    with neuropathy  . Diabetes mellitus, type 2 ([place])   . Diarrhea   . Double vision   . DVT (deep venous thrombosis) ([place])    right leg 10/2015 was on coumadin off as of 2017/2018 ; s/p IVC filter  . Enuresis   . Eye pain, right   . Fall   . Fatty liver    09/15/15 also mildly dilated pancreatitic duct rec MRCP small sub cm cyst hemangioma speeln mild right hydronephrorossi and prox. hydroureter, kidney stones, mild scarring kidneys  . Female stress incontinence   . Flank pain   . GERD (gastroesophageal reflux disease)    with small hiatal hernia   . Hard of hearing   . Heart disease   . History of kidney problems   . Hyperlipidemia    mixed  . Hypertension   . Hypothyroidism, postsurgical   . Impaired mobility  and ADLs    uses rolling walker has caretaker 24/7 at home  . Leg edema   . Mixed incontinence urge and stress (female)(female)   . Neuropathy   . Osteoarthritis    DDD spine   . Osteoporosis with fracture    T12 compression fracture  . Photophobia   . Pulmonary embolism ([place])    10/2015 off coumadin as of 04/2016  . Pulmonary HTN (HCC)    mild pulm HTN, echo 10/09/15 EF 29-52%WUXLK 1 dd, RV systolic pressure increased   . Recurrent UTI   . Sinus pressure   . Skin cancer    BCC jawline and scalp   . Thyroid disease    follows [facility]  . TIA (transient ischemic attack)    MRI 2009/2010 neg stroke   . Trigeminal neuralgia    Dr. Tomi Bamberger s/p gamma knife x 2, on Tegretol since 2011/2012 no increase in dose >200 mg bid rec per family per neurology   . Urinary frequency   . Urinary, incontinence, stress female    Dr Erlene Quan urology     [facility] COURSE:   84 year old elderly female patient with history of DVT in the lower extremity, pulmonary embolism, coronary artery disease, CVA, type 2 diabetes mellitus, hypertension was referred by her physician for extensive DVT in the lower extremity.  *Bilateral  lower extremity DVT IV heparin drip for anticoagulation---add oral anticoagulation. Patient was on warfarin previously for her history of DVT and pulmonary embolism per her son -will do lovenox + warfarin---till INR is therapuetic x 2 and then d/c lovenox.  -Spoke with Dr Karlyn Agee (PCP) [facility] -pharmacy to follow labs(PT/INR) at [facility] consultation for bilateral thrombectomy--pt will need IVC filter per Dr Lucky Cowboy later date -initiate physical therapy ---will arrange HHPT  *Diabetes mellitus type 2 Resume diabetic medication with sliding scale coverage with insulin  *Coronary artery disease Resume aspirin and statin  *Hypertension Medical management  *Right elbow bruise-PRN pain meds and ice pack -lidocaine  patch  *constipation will give bowel HHPT at [facility] with son Leanor Kail CONSULTS OBTAINED:  Treatment Team:  Algernon Huxley, MD  DRUG ALLERGIES:   Allergies  Allergen Reactions  . Penicillins Shortness Of Breath, Rash and Other (See Comments)    Has patient had a PCN reaction causing immediate rash, facial/tongue/throat swelling, SOB or lightheadedness with hypotension: Yes Has patient had a PCN reaction causing severe rash involving mucus membranes or skin necrosis: No Has patient had a PCN reaction that required hospitalization No Has patient had a PCN reaction occurring within the last 10 years: No If all of the above answers are "NO", then may proceed with Cephalosporin use.  . Sulfa Antibiotics Shortness Of Breath, Rash and Other (See Comments)  . Amitiza [Lubiprostone]     N/v/d  . Aspirin Other (See Comments)    Reaction:  Unknown  Other reaction(s): Bleeding (intolerance) Per patient " causes nose to bleed" Can take 81 mg daily without any complications Other reaction(s): "bloody nose"   . Penicillin G Other (See Comments)    Has patient had a PCN reaction causing immediate rash, facial/tongue/throat swelling, SOB or lightheadedness with hypotension: No Has patient had a PCN reaction causing severe rash involving mucus membranes or skin necrosis: Unknown Has patient had a PCN reaction that required hospitalization: Unknown Has patient had a PCN reaction occurring within the last 10 years: Unknown If all of the above answers are "NO", then may proceed with Cephalosporin use.    DISCHARGE MEDICATIONS:   Allergies as of 04/01/2018      Reactions   Penicillins Shortness Of Breath, Rash, Other (See Comments)   Has patient had a PCN reaction causing immediate rash, facial/tongue/throat swelling, SOB or lightheadedness with hypotension: Yes Has patient had a PCN reaction causing severe rash involving mucus membranes or skin necrosis: No Has patient had a PCN  reaction that required hospitalization No Has patient had a PCN reaction occurring within the last 10 years: No If all of the above answers are "NO", then may proceed with Cephalosporin use.   Sulfa Antibiotics Shortness Of Breath, Rash, Other (See Comments)   Amitiza [lubiprostone]    N/v/d   Aspirin Other (See Comments)   Reaction:  Unknown  Other reaction(s): Bleeding (intolerance) Per patient " causes nose to bleed" Can take 81 mg daily without any complications Other reaction(s): "bloody nose"   Penicillin G Other (See Comments)   Has patient had a PCN reaction causing immediate rash, facial/tongue/throat swelling, SOB or lightheadedness with hypotension: No Has patient had a PCN reaction causing severe rash involving mucus membranes or skin necrosis: Unknown Has patient had a PCN reaction that required hospitalization: Unknown Has patient had a PCN reaction occurring within the last 10 years: Unknown If all of the above answers are "NO", then may  proceed with Cephalosporin use.      Medication List    STOP taking these medications   magnesium hydroxide 400 MG/5ML suspension Commonly known as:  MILK OF MAGNESIA   metFORMIN 500 MG 24 hr tablet Commonly known as:  GLUCOPHAGE-XR   Polyethyl Glycol-Propyl Glycol 0.4-0.3 % Soln     TAKE these medications   acetaminophen 325 MG tablet Commonly known as:  TYLENOL Take 650 mg by mouth every 6 (six) hours as needed.   aspirin EC 81 MG tablet Take 81 mg by mouth daily.   atorvastatin 40 MG tablet Commonly known as:  LIPITOR Take 40 mg by mouth daily at 6 PM.   bisacodyl 5 MG EC tablet Commonly known as:  DULCOLAX Take 1 tablet (5 mg total) by mouth daily as needed for moderate constipation.   budesonide-formoterol 160-4.5 MCG/ACT inhaler Commonly known as:  SYMBICORT Inhale 2 puffs into the lungs 2 (two) times daily.   CALCIUM 600+D 600-400 MG-UNIT tablet Generic drug:  Calcium Carbonate-Vitamin D Take 1 tablet by  mouth daily.   carbamazepine 200 MG tablet Commonly known as:  TEGRETOL Take 1 tablet (200 mg total) by mouth 2 (two) times daily.   docusate sodium 100 MG capsule Commonly known as:  COLACE Take 1 capsule (100 mg total) by mouth 2 (two) times daily.   enoxaparin 100 MG/ML injection Commonly known as:  LOVENOX Inject 0.95 mLs (95 mg total) into the skin 2 (two) times daily.   ENSURE ENLIVE PO Take 1 Bottle by mouth 2 (two) times daily.   feeding supplement (PRO-STAT SUGAR FREE 64) Liqd Take 30 mLs by mouth 2 (two) times daily between meals.   fluticasone 50 MCG/ACT nasal spray Commonly known as:  FLONASE Place 1 spray into both nostrils daily.   furosemide 40 MG tablet Commonly known as:  LASIX Take 1 tablet (40 mg total) by mouth daily.   gabapentin 100 MG capsule Commonly known as:  NEURONTIN Take 100 mg by mouth 2 (two) times daily.   guaiFENesin 600 MG 12 hr tablet Commonly known as:  MUCINEX Take 600 mg by mouth daily.   HYDROcodone-acetaminophen 5-325 MG tablet Commonly known as:  NORCO/VICODIN Take 1 tablet by mouth every 6 (six) hours as needed for moderate pain or severe pain.   insulin regular 100 units/mL injection Commonly known as:  NOVOLIN R,HUMULIN R Inject 0-12 Units into the skin 3 (three) times daily before meals. 100 unit/mL; amt: Per Sliding Scale; If Blood Sugar is 176 to 250, give 3 Units.If Blood Sugar is 251 to 325, give 6 Units.If Blood Sugar is 326 to 450, give 10 Units. If Blood Sugar is greater than 450, give 12 Units and recheck in 1 hour. Notify MD/NP if >450 after recheck. Check blood sugar AC & HS   levothyroxine 200 MCG tablet Commonly known as:  SYNTHROID, LEVOTHROID Take 200 mcg by mouth daily before breakfast. Except on Sunday. Do not take with other medications   lidocaine 5 % Commonly known as:  LIDODERM Place 1 patch onto the skin daily. Remove & Discard patch within 12 hours or as directed by MD   midodrine 10 MG  tablet Commonly known as:  PROAMATINE Take 1 tablet (10 mg total) by mouth 3 (three) times daily with meals.   multivitamin with minerals Tabs tablet Take 1 tablet by mouth daily.   pantoprazole 40 MG tablet Commonly known as:  PROTONIX Take 1 tablet (40 mg total) by mouth daily.   polyethylene  glycol powder powder Commonly known as:  GLYCOLAX/MIRALAX Take 17 g by mouth daily as needed for mild constipation.   tiotropium 18 MCG inhalation capsule Commonly known as:  SPIRIVA Place 1 capsule (18 mcg total) into inhaler and inhale daily.   traZODone 50 MG tablet Commonly known as:  DESYREL Take 0.5 tablets (25 mg total) by mouth at bedtime as needed for sleep.   warfarin 7.5 MG tablet Commonly known as:  COUMADIN Take 1 tablet (7.5 mg total) by mouth daily at 6 PM.       If you experience worsening of your admission symptoms, develop shortness of breath, life threatening emergency, suicidal or homicidal thoughts you must seek medical attention immediately by calling 911 or calling your MD immediately  if symptoms less severe.  You Must read complete instructions/literature along with all the possible adverse reactions/side effects for all the Medicines you take and that have been prescribed to you. Take any new Medicines after you have completely understood and accept all the possible adverse reactions/side effects.   Please note  You were cared for by a hospitalist during your hospital stay. If you have any questions about your discharge medications or the care you received while you were in the hospital after you are discharged, you can call the unit and asked to speak with the hospitalist on call if the hospitalist that took care of you is not available. Once you are discharged, your primary care physician will handle any further medical issues. Please note that NO REFILLS for any discharge medications will be authorized once you are discharged, as it is imperative that you return  to your primary care physician (or establish a relationship with a primary care physician if you do not have one) for your aftercare needs so that they can reassess your need for medications and monitor your lab values. Today   SUBJECTIVE   Right arm pain constipation  VITAL SIGNS:  Blood pressure (!) 129/56, pulse 64, temperature 98.2 F (36.8 C), temperature source Oral, resp. rate 20, height 5\' 9"  (1.753 m), weight 93.9 kg (207 lb), SpO2 95 %.  I/O:    Intake/Output Summary (Last 24 hours) at 04/01/2018 1331 Last data filed at 04/01/2018 1018 Gross per 24 hour  Intake 860 ml  Output 1800 ml  Net -940 ml    PHYSICAL EXAMINATION:  GENERAL:  84 y.o.-year-old patient lying in the bed with no acute distress.  EYES: Pupils equal, round, reactive to light and accommodation. No scleral icterus. Extraocular muscles intact.  HEENT: Head atraumatic, normocephalic. Oropharynx and nasopharynx clear.  NECK:  Supple, no jugular venous distention. No thyroid enlargement, no tenderness.  LUNGS: Normal breath sounds bilaterally, no wheezing, rales,rhonchi or crepitation. No use of accessory muscles of respiration.  CARDIOVASCULAR: S1, S2 normal. No murmurs, rubs, or gallops.  ABDOMEN: Soft, non-tender, non-distended. Bowel sounds present. No organomegaly or mass.  EXTREMITIES: ++ pedal edema, no cyanosis, or clubbing.  Right arm bruise ++. No cellulitis NEUROLOGIC: Cranial nerves II through XII are intact. Muscle strength 5/5 in all extremities. Sensation intact. Gait not checked.  PSYCHIATRIC: The patient is alert and oriented x 3.  SKIN: No obvious rash, lesion, or ulcer.   DATA REVIEW:   CBC  Recent Labs  Lab 03/31/18 0458  WBC 7.0  HGB 9.9*  HCT 29.3*  PLT 205    Chemistries  Recent Labs  Lab 03/27/18 1815  03/30/18 0745  NA 139   < > 139  K 4.0   < >  3.9  CL 100*   < > 104  CO2 32   < > 29  GLUCOSE 113*   < > 106*  BUN 20   < > 16  CREATININE 1.15*   < > 1.28*   CALCIUM 8.5*   < > 7.8*  AST 25  --   --   ALT 13*  --   --   ALKPHOS 81  --   --   BILITOT 0.3  --   --    < > = values in this interval not displayed.    Microbiology Results   Recent Results (from the past 240 hour(s))  MRSA PCR Screening     Status: None   Collection Time: 04/01/18  8:09 AM  Result Value Ref Range Status   MRSA by PCR NEGATIVE NEGATIVE Final    Comment:        The GeneXpert MRSA Assay (FDA approved for NASAL specimens only), is one component of a comprehensive MRSA colonization surveillance program. It is not intended to diagnose MRSA infection nor to guide or monitor treatment for MRSA infections. Performed at [facility], [address] [place], [place] [address]     RADIOLOGY:  No results found.   Management plans discussed with the patient, family and they are in agreement.  CODE STATUS:     Code Status Orders  (From admission, onward)        Start     Ordered   03/27/18 2303  Full code  Continuous     03/27/18 2303    Code Status History    Date Active Date Inactive Code Status Order ID Comments User Context   03/06/2018 0108 03/08/2018 1842 Full Code [id number]  Amelia Jo, MD ED   03/04/2018 2230 03/05/2018 1742 Full Code [id number]  Amelia Jo, MD Inpatient   10/08/2015 2112 10/13/2015 1651 Full Code [id number]  Theodoro Grist, MD Inpatient   09/16/2015 0234 09/19/2015 0236 Full Code [id number]  Fritzi Mandes, MD Inpatient    Advance Directive Documentation     Most Recent Value  Type of Advance Directive  Healthcare Power of Attorney  Pre-existing out of facility DNR order (yellow form or pink MOST form)  -  "MOST" Form in Place?  -      TOTAL TIME TAKING CARE OF THIS PATIENT: *40* minutes.    Fritzi Mandes M.D on 04/01/2018 at 1:31 PM  Between 7am to 6pm - Pager - [phone] After 6pm go to [email or web address] - password [email or web address]  Office  [phone]  CC: Primary care physician;  McLean-Scocuzza, Nino Glow, MD

## 2018-04-01 NOTE — Clinical Social Work Note (Addendum)
CSW spoke with Lattie Haw the nurse at [facility], she can accept patient back today.  The facility will provide transportation, patient's family is aware that she will be discharging today.  Patient to be d/c'ed today to [facility].  Patient and family agreeable to plans will transport via facility transportation, RN to call report.  Evette Cristal, MSW, [place]

## 2018-04-01 NOTE — Progress Notes (Signed)
[place] for warfarin dosing  Indication: DVT  Allergies  Allergen Reactions  . Penicillins Shortness Of Breath, Rash and Other (See Comments)    Has patient had a PCN reaction causing immediate rash, facial/tongue/throat swelling, SOB or lightheadedness with hypotension: Yes Has patient had a PCN reaction causing severe rash involving mucus membranes or skin necrosis: No Has patient had a PCN reaction that required hospitalization No Has patient had a PCN reaction occurring within the last 10 years: No If all of the above answers are "NO", then may proceed with Cephalosporin use.  . Sulfa Antibiotics Shortness Of Breath, Rash and Other (See Comments)  . Amitiza [Lubiprostone]     N/v/d  . Aspirin Other (See Comments)    Reaction:  Unknown  Other reaction(s): Bleeding (intolerance) Per patient " causes nose to bleed" Can take 81 mg daily without any complications Other reaction(s): "bloody nose"   . Penicillin G Other (See Comments)    Has patient had a PCN reaction causing immediate rash, facial/tongue/throat swelling, SOB or lightheadedness with hypotension: No Has patient had a PCN reaction causing severe rash involving mucus membranes or skin necrosis: Unknown Has patient had a PCN reaction that required hospitalization: Unknown Has patient had a PCN reaction occurring within the last 10 years: Unknown If all of the above answers are "NO", then may proceed with Cephalosporin use.    Patient Measurements: Height: 5\' 9"  (175.3 cm) Weight: 207 lb (93.9 kg) IBW/kg (Calculated) : 66.2 Heparin Dosing Weight: 86.1kg  Vital Signs: Temp: 97.8 F (36.6 C) (04/29 0639) Temp Source: Oral (04/29 0639) BP: 127/61 (04/29 0639) Pulse Rate: 70 (04/29 0639)  Labs: Recent Labs    03/30/18 0009 03/30/18 0745 03/31/18 0458 04/01/18 0452  HGB  --  10.3* 9.9*  --   HCT  --  30.7* 29.3*  --   PLT  --  188 205  --   LABPROT  --   --  13.7 14.3   INR  --   --  1.06 1.12  HEPARINUNFRC 0.43 0.44 0.31  --   CREATININE  --  1.28*  --   --     Estimated Creatinine Clearance: 36.4 mL/min (A) (by C-G formula based on SCr of 1.28 mg/dL (H)).   Medical History: Past Medical History:  Diagnosis Date  . Acoustic neuroma ([place])   . Allergy   . Asthma   . Bilateral swelling of feet    and legs  . Bladder infection   . CAD (coronary artery disease)   . Cataract   . Change in voice   . Compression fracture of body of thoracic vertebra (HCC)    T12 09/18/15 MRI s/p fall   . Constipation   . COPD (chronic obstructive pulmonary disease) ([place])    previous CXR with chronic interstitial lung dz   . CVA (cerebral vascular accident) ([place])   . Depression   . Diabetes ([place])    with neuropathy  . Diabetes mellitus, type 2 ([place])   . Diarrhea   . Double vision   . DVT (deep venous thrombosis) ([place])    right leg 10/2015 was on coumadin off as of 2017/2018 ; s/p IVC filter  . Enuresis   . Eye pain, right   . Fall   . Fatty liver    09/15/15 also mildly dilated pancreatitic duct rec MRCP small sub cm cyst hemangioma speeln mild right hydronephrorossi and prox. hydroureter, kidney stones, mild scarring kidneys  .  Female stress incontinence   . Flank pain   . GERD (gastroesophageal reflux disease)    with small hiatal hernia   . Hard of hearing   . Heart disease   . History of kidney problems   . Hyperlipidemia    mixed  . Hypertension   . Hypothyroidism, postsurgical   . Impaired mobility and ADLs    uses rolling walker has caretaker 24/7 at home  . Leg edema   . Mixed incontinence urge and stress (female)(female)   . Neuropathy   . Osteoarthritis    DDD spine   . Osteoporosis with fracture    T12 compression fracture  . Photophobia   . Pulmonary embolism ([place])    10/2015 off coumadin as of 04/2016  . Pulmonary HTN (HCC)    mild pulm HTN, echo 10/09/15 EF 10-31%RXYVO 1 dd, RV systolic pressure increased   . Recurrent UTI   .  Sinus pressure   . Skin cancer    BCC jawline and scalp   . Thyroid disease    follows [facility]  . TIA (transient ischemic attack)    MRI 2009/2010 neg stroke   . Trigeminal neuralgia    Dr. Tomi Bamberger s/p gamma knife x 2, on Tegretol since 2011/2012 no increase in dose >200 mg bid rec per family per neurology   . Urinary frequency   . Urinary, incontinence, stress female    Dr Erlene Quan urology     Medications:  Scheduled:  . aspirin EC  81 mg Oral Daily  . atorvastatin  40 mg Oral q1800  . calcium-vitamin D  1 tablet Oral Daily  . carbamazepine  200 mg Oral BID  . docusate sodium  100 mg Oral BID  . enoxaparin (LOVENOX) injection  1 mg/kg Subcutaneous BID  . feeding supplement (ENSURE ENLIVE)  1 Bottle Oral BID  . feeding supplement (PRO-STAT SUGAR FREE 64)  30 mL Oral BID BM  . fluticasone  1 spray Each Nare Daily  . gabapentin  100 mg Oral BID  . guaiFENesin  600 mg Oral Daily  . insulin aspart  0-15 Units Subcutaneous TID WC  . insulin aspart  0-5 Units Subcutaneous QHS  . levothyroxine  200 mcg Oral QAC breakfast  . midodrine  10 mg Oral TID WC  . mometasone-formoterol  2 puff Inhalation BID  . multivitamin with minerals  1 tablet Oral Daily  . pantoprazole  40 mg Oral Daily  . polyvinyl alcohol  1 drop Both Eyes QHS  . sodium chloride flush  3 mL Intravenous Q12H  . tiotropium  18 mcg Inhalation Daily  . warfarin  7.5 mg Oral q1800  . Warfarin - Pharmacist Dosing Inpatient   Does not apply q1800   Infusions:  . sodium chloride      Assessment: Pharmacy consulted for oral anticoagulation management for 84 yo female admitted with DVT.    Patient on carbamazepine as an outpatient. Patient will need warfarin therapy due to interaction with DOACs and carbamazepine. Patient has taken warfarin in the past.    Warfarin started on 4/27 along with heparin drip. Heparin drip discontinued today 4/28 . Started Lovenox 1mg /kg Q12H.    DATE INR DOSE 4/27  7.5  mg 4/28 1.06 7.5 mg 4/29 1.12   Goal of Therapy:  INR 2-3  Plan:  Will continue warfarin 7.5mg  PO daily. Patient will need bridging for at least 5 days and two consecutive INRs.   Will obtain INR with am labs.  Pharmacy will continue to monitor and adjust per consult.    Laural Benes, [facility] 04/01/2018

## 2018-04-01 NOTE — Telephone Encounter (Signed)
Copied from [place] [phone]. Topic: Appointment Scheduling - Scheduling Inquiry for Clinic >> Apr 01, 2018  3:45 PM Synthia Innocent wrote: Reason for CRM: [facility] calling and patient is scheduled for hospital on 04/10/18. Home states patient needs to be seen tomorrow, states it is imperative that she is seen.

## 2018-04-01 NOTE — Discharge Instructions (Signed)
Vascular Surgery Recommendations: The patient was encouraged to wear graduated compression stockings (20-30 mmHg) on a daily basis. The patient was instructed to begin wearing the stockings first thing in the morning and removing them in the evening. The patient was instructed specifically not to sleep in the stockings.  We highly recommend you are measured by a competent sales associate to assure an appropriate fitting compression sock. In addition, behavioral modification including elevation during the day will be initiated. The patient was instructed to call the office in the interim if any worsening edema or ulcerations to the legs, feet or toes occurs.   ICE pack on right forearm/arm Keep hand elevated and continue PT

## 2018-04-01 NOTE — Progress Notes (Signed)
Per MD okay for RN to pur order for soap enema.

## 2018-04-01 NOTE — Progress Notes (Signed)
Call report to [facility]. Report given to [facility] and Doctor, general practice.

## 2018-04-01 NOTE — Care Management (Signed)
Discharge to home today per Dr. Posey Pronto. Home health orders faxed to [place] RN MSN [place] [phone]

## 2018-04-01 NOTE — Care Management Important Message (Signed)
Copy of signed IM left in patient's room.    

## 2018-04-01 NOTE — Progress Notes (Signed)
Mikaylee C [place]ling[place]  A and O x 4. VSS. Pt tolerating diet well. No complaints of pain or nausea. IV removed intact, prescriptions given. Pt voiced understanding of discharge instructions with no further questions. Pt discharged via wheelchair with [facility] staff and son.     Allergies as of 04/01/2018      Reactions   Penicillins Shortness Of Breath, Rash, Other (See Comments)   Has patient had a PCN reaction causing immediate rash, facial/tongue/throat swelling, SOB or lightheadedness with hypotension: Yes Has patient had a PCN reaction causing severe rash involving mucus membranes or skin necrosis: No Has patient had a PCN reaction that required hospitalization No Has patient had a PCN reaction occurring within the last 10 years: No If all of the above answers are "NO", then may proceed with Cephalosporin use.   Sulfa Antibiotics Shortness Of Breath, Rash, Other (See Comments)   Amitiza [lubiprostone]    N/v/d   Aspirin Other (See Comments)   Reaction:  Unknown  Other reaction(s): Bleeding (intolerance) Per patient " causes nose to bleed" Can take 81 mg daily without any complications Other reaction(s): "bloody nose"   Penicillin G Other (See Comments)   Has patient had a PCN reaction causing immediate rash, facial/tongue/throat swelling, SOB or lightheadedness with hypotension: No Has patient had a PCN reaction causing severe rash involving mucus membranes or skin necrosis: Unknown Has patient had a PCN reaction that required hospitalization: Unknown Has patient had a PCN reaction occurring within the last 10 years: Unknown If all of the above answers are "NO", then may proceed with Cephalosporin use.      Medication List    STOP taking these medications   magnesium hydroxide 400 MG/5ML suspension Commonly known as:  MILK OF MAGNESIA   metFORMIN 500 MG 24 hr tablet Commonly known as:  GLUCOPHAGE-XR   Polyethyl Glycol-Propyl Glycol 0.4-0.3 % Soln     TAKE these medications    acetaminophen 325 MG tablet Commonly known as:  TYLENOL Take 650 mg by mouth every 6 (six) hours as needed.   aspirin EC 81 MG tablet Take 81 mg by mouth daily.   atorvastatin 40 MG tablet Commonly known as:  LIPITOR Take 40 mg by mouth daily at 6 PM.   bisacodyl 5 MG EC tablet Commonly known as:  DULCOLAX Take 1 tablet (5 mg total) by mouth daily as needed for moderate constipation.   budesonide-formoterol 160-4.5 MCG/ACT inhaler Commonly known as:  SYMBICORT Inhale 2 puffs into the lungs 2 (two) times daily.   CALCIUM 600+D 600-400 MG-UNIT tablet Generic drug:  Calcium Carbonate-Vitamin D Take 1 tablet by mouth daily.   carbamazepine 200 MG tablet Commonly known as:  TEGRETOL Take 1 tablet (200 mg total) by mouth 2 (two) times daily.   docusate sodium 100 MG capsule Commonly known as:  COLACE Take 1 capsule (100 mg total) by mouth 2 (two) times daily.   enoxaparin 100 MG/ML injection Commonly known as:  LOVENOX Inject 0.95 mLs (95 mg total) into the skin 2 (two) times daily.   ENSURE ENLIVE PO Take 1 Bottle by mouth 2 (two) times daily.   feeding supplement (PRO-STAT SUGAR FREE 64) Liqd Take 30 mLs by mouth 2 (two) times daily between meals.   fluticasone 50 MCG/ACT nasal spray Commonly known as:  FLONASE Place 1 spray into both nostrils daily.   furosemide 40 MG tablet Commonly known as:  LASIX Take 1 tablet (40 mg total) by mouth daily.   gabapentin 100 MG  capsule Commonly known as:  NEURONTIN Take 100 mg by mouth 2 (two) times daily.   guaiFENesin 600 MG 12 hr tablet Commonly known as:  MUCINEX Take 600 mg by mouth daily.   HYDROcodone-acetaminophen 5-325 MG tablet Commonly known as:  NORCO/VICODIN Take 1 tablet by mouth every 6 (six) hours as needed for moderate pain or severe pain.   insulin regular 100 units/mL injection Commonly known as:  NOVOLIN R,HUMULIN R Inject 0-12 Units into the skin 3 (three) times daily before meals. 100 unit/mL; amt:  Per Sliding Scale; If Blood Sugar is 176 to 250, give 3 Units.If Blood Sugar is 251 to 325, give 6 Units.If Blood Sugar is 326 to 450, give 10 Units. If Blood Sugar is greater than 450, give 12 Units and recheck in 1 hour. Notify MD/NP if >450 after recheck. Check blood sugar AC & HS   levothyroxine 200 MCG tablet Commonly known as:  SYNTHROID, LEVOTHROID Take 200 mcg by mouth daily before breakfast. Except on Sunday. Do not take with other medications   lidocaine 5 % Commonly known as:  LIDODERM Place 1 patch onto the skin daily. Remove & Discard patch within 12 hours or as directed by MD   midodrine 10 MG tablet Commonly known as:  PROAMATINE Take 1 tablet (10 mg total) by mouth 3 (three) times daily with meals.   multivitamin with minerals Tabs tablet Take 1 tablet by mouth daily.   pantoprazole 40 MG tablet Commonly known as:  PROTONIX Take 1 tablet (40 mg total) by mouth daily.   polyethylene glycol powder powder Commonly known as:  GLYCOLAX/MIRALAX Take 17 g by mouth daily as needed for mild constipation.   tiotropium 18 MCG inhalation capsule Commonly known as:  SPIRIVA Place 1 capsule (18 mcg total) into inhaler and inhale daily.   traZODone 50 MG tablet Commonly known as:  DESYREL Take 0.5 tablets (25 mg total) by mouth at bedtime as needed for sleep.   warfarin 7.5 MG tablet Commonly known as:  COUMADIN Take 1 tablet (7.5 mg total) by mouth daily at 6 PM.       Vitals:   04/01/18 1345 04/01/18 1346  BP:    Pulse:    Resp:    Temp:    SpO2: (!) 89% 92%    Francesco Sor

## 2018-04-02 ENCOUNTER — Telehealth: Payer: Self-pay | Admitting: Internal Medicine

## 2018-04-02 NOTE — Telephone Encounter (Signed)
Can Lovenox be given 8am and 4pm (during regular nursing visit hours)? Patient is at [facility]

## 2018-04-02 NOTE — Telephone Encounter (Signed)
Transition Care Management Follow-up Telephone Call  How have you been since you were released from the hospital? Patient feeling better no complaints pe nurse from [place].   Do you understand why you were in the hospital? yes   Do you understand the discharge instrcutions? yes  Items Reviewed:  Medications reviewed: yes  Allergies reviewed: yes  Dietary changes reviewed: yes  Referrals reviewed: yes   Functional Questionnaire:   Activities of Daily Living (ADLs):   She states they are independent in the following: ambulation,feeding, States they require assistance with the following: Dressing, Bathing and grooming.   Any transportation issues/concerns?: no   Any patient concerns? Yes, Nurse from [facility] ask patient taking coumadin 7.5 mg, Lovenox 95 mg BID and Aspirin 81 mg. Patient INR at discharge was 1.12   Confirmed importance and date/time of follow-up visits scheduled: yes   Confirmed with patient if condition begins to worsen call PCP or go to the ER.  Patient was given the Call-a-Nurse line [phone]: yes

## 2018-04-02 NOTE — Telephone Encounter (Signed)
Patient has appointment [phone]

## 2018-04-02 NOTE — Telephone Encounter (Signed)
Call from patient.

## 2018-04-02 NOTE — Telephone Encounter (Signed)
Please advise 

## 2018-04-02 NOTE — Telephone Encounter (Signed)
Copied from [place] [phone]. Topic: General - Other >> Apr 02, 2018  3:24 PM Margot Ables wrote: Reason for CRM: came home yesterday after bilateral DVT, pt on Lovenox 95mg  twice daily and coumadin 7.5mg  daily, INR was drawn this morning, waiting for INR results, Will Dr. Terese Door manage pts coumadin. 4 week 1, 2 week 2, 1 week 3, 3 prn for nursing visitis Can Lovenox be given 8am and 4pm (during regular nursing visit hours)? Patient is at [facility] Will call with INR results

## 2018-04-02 NOTE — Telephone Encounter (Signed)
Copied from [place] [phone]. Topic: Quick Communication - See Telephone Encounter >> Apr 02, 2018  4:46 PM Bea Graff, NT wrote: CRM for notification. See Telephone encounter for: 04/02/18. Pts daughter states that while pt was in the hospital and at [facility] her mom was taking Trazodone to help her sleep but [facility] does not have that order and needing one sent over in order to fill this for this pt.

## 2018-04-02 NOTE — Telephone Encounter (Signed)
Left message for nurse at [facility]) to please return call to office. [facility] nurse may advise.

## 2018-04-02 NOTE — Telephone Encounter (Signed)
It is every 12 hours for 2x per day dosing 95 mg 2x per day  OR 142 1x per day   I will not manage Coumadin long term and likely refer to hematology/oncology but will check INR at f/u  Thanks tMS

## 2018-04-02 NOTE — Telephone Encounter (Signed)
Left message to call office

## 2018-04-02 NOTE — Telephone Encounter (Signed)
Ok to continue aspirin 81 mg qd

## 2018-04-02 NOTE — Telephone Encounter (Signed)
Patient taking 81 Mg  Aspirin , Coumadin 7.5 mg , lovenox 95 mg BID, Patient INR leaving hospital 1.12 Nurse at [facility] ask should patient continue aspirin?

## 2018-04-03 ENCOUNTER — Emergency Department: Payer: Medicare Other

## 2018-04-03 ENCOUNTER — Other Ambulatory Visit: Payer: Self-pay

## 2018-04-03 ENCOUNTER — Telehealth: Payer: Self-pay

## 2018-04-03 ENCOUNTER — Inpatient Hospital Stay
Admission: EM | Admit: 2018-04-03 | Discharge: 2018-04-08 | DRG: 871 | Disposition: A | Discharge status: Home Health | Payer: Medicare Other | Attending: Internal Medicine | Admitting: Internal Medicine

## 2018-04-03 DIAGNOSIS — Z87891 Personal history of nicotine dependence: Secondary | ICD-10-CM

## 2018-04-03 DIAGNOSIS — I959 Hypotension, unspecified: Secondary | ICD-10-CM | POA: Diagnosis present

## 2018-04-03 DIAGNOSIS — E1122 Type 2 diabetes mellitus with diabetic chronic kidney disease: Secondary | ICD-10-CM | POA: Diagnosis present

## 2018-04-03 DIAGNOSIS — Z66 Do not resuscitate: Secondary | ICD-10-CM | POA: Diagnosis present

## 2018-04-03 DIAGNOSIS — R609 Edema, unspecified: Secondary | ICD-10-CM

## 2018-04-03 DIAGNOSIS — D649 Anemia, unspecified: Secondary | ICD-10-CM | POA: Diagnosis present

## 2018-04-03 DIAGNOSIS — A419 Sepsis, unspecified organism: Secondary | ICD-10-CM | POA: Diagnosis not present

## 2018-04-03 DIAGNOSIS — Z794 Long term (current) use of insulin: Secondary | ICD-10-CM

## 2018-04-03 DIAGNOSIS — Z9049 Acquired absence of other specified parts of digestive tract: Secondary | ICD-10-CM

## 2018-04-03 DIAGNOSIS — Z79899 Other long term (current) drug therapy: Secondary | ICD-10-CM

## 2018-04-03 DIAGNOSIS — Z7901 Long term (current) use of anticoagulants: Secondary | ICD-10-CM

## 2018-04-03 DIAGNOSIS — I2510 Atherosclerotic heart disease of native coronary artery without angina pectoris: Secondary | ICD-10-CM | POA: Diagnosis present

## 2018-04-03 DIAGNOSIS — Y95 Nosocomial condition: Secondary | ICD-10-CM | POA: Diagnosis present

## 2018-04-03 DIAGNOSIS — E785 Hyperlipidemia, unspecified: Secondary | ICD-10-CM | POA: Diagnosis present

## 2018-04-03 DIAGNOSIS — Z85828 Personal history of other malignant neoplasm of skin: Secondary | ICD-10-CM

## 2018-04-03 DIAGNOSIS — J440 Chronic obstructive pulmonary disease with acute lower respiratory infection: Secondary | ICD-10-CM | POA: Diagnosis present

## 2018-04-03 DIAGNOSIS — Z86711 Personal history of pulmonary embolism: Secondary | ICD-10-CM

## 2018-04-03 DIAGNOSIS — Z8249 Family history of ischemic heart disease and other diseases of the circulatory system: Secondary | ICD-10-CM

## 2018-04-03 DIAGNOSIS — Z86718 Personal history of other venous thrombosis and embolism: Secondary | ICD-10-CM

## 2018-04-03 DIAGNOSIS — Z8673 Personal history of transient ischemic attack (TIA), and cerebral infarction without residual deficits: Secondary | ICD-10-CM

## 2018-04-03 DIAGNOSIS — N183 Chronic kidney disease, stage 3 (moderate): Secondary | ICD-10-CM | POA: Diagnosis present

## 2018-04-03 DIAGNOSIS — K760 Fatty (change of) liver, not elsewhere classified: Secondary | ICD-10-CM | POA: Diagnosis present

## 2018-04-03 DIAGNOSIS — I272 Pulmonary hypertension, unspecified: Secondary | ICD-10-CM | POA: Diagnosis present

## 2018-04-03 DIAGNOSIS — E890 Postprocedural hypothyroidism: Secondary | ICD-10-CM | POA: Diagnosis present

## 2018-04-03 DIAGNOSIS — K219 Gastro-esophageal reflux disease without esophagitis: Secondary | ICD-10-CM | POA: Diagnosis present

## 2018-04-03 DIAGNOSIS — Z79890 Hormone replacement therapy (postmenopausal): Secondary | ICD-10-CM

## 2018-04-03 DIAGNOSIS — Z833 Family history of diabetes mellitus: Secondary | ICD-10-CM

## 2018-04-03 DIAGNOSIS — Z7982 Long term (current) use of aspirin: Secondary | ICD-10-CM

## 2018-04-03 DIAGNOSIS — E1140 Type 2 diabetes mellitus with diabetic neuropathy, unspecified: Secondary | ICD-10-CM | POA: Diagnosis present

## 2018-04-03 DIAGNOSIS — G500 Trigeminal neuralgia: Secondary | ICD-10-CM | POA: Diagnosis present

## 2018-04-03 DIAGNOSIS — G40909 Epilepsy, unspecified, not intractable, without status epilepticus: Secondary | ICD-10-CM | POA: Diagnosis present

## 2018-04-03 DIAGNOSIS — I129 Hypertensive chronic kidney disease with stage 1 through stage 4 chronic kidney disease, or unspecified chronic kidney disease: Secondary | ICD-10-CM | POA: Diagnosis present

## 2018-04-03 DIAGNOSIS — L600 Ingrowing nail: Secondary | ICD-10-CM | POA: Diagnosis present

## 2018-04-03 DIAGNOSIS — M810 Age-related osteoporosis without current pathological fracture: Secondary | ICD-10-CM | POA: Diagnosis present

## 2018-04-03 DIAGNOSIS — Z7951 Long term (current) use of inhaled steroids: Secondary | ICD-10-CM

## 2018-04-03 DIAGNOSIS — J189 Pneumonia, unspecified organism: Secondary | ICD-10-CM | POA: Diagnosis present

## 2018-04-03 DIAGNOSIS — Z95828 Presence of other vascular implants and grafts: Secondary | ICD-10-CM

## 2018-04-03 LAB — CBC WITH DIFFERENTIAL/PLATELET
Basophils Absolute: 0 10*3/uL (ref 0–0.1)
Basophils Relative: 0 %
Eosinophils Absolute: 0.1 10*3/uL (ref 0–0.7)
Eosinophils Relative: 1 %
HCT: 30.9 % — ABNORMAL LOW (ref 35.0–47.0)
Hemoglobin: 10.5 g/dL — ABNORMAL LOW (ref 12.0–16.0)
Lymphocytes Relative: 14 %
Lymphs Abs: 1.3 10*3/uL (ref 1.0–3.6)
MCH: 31.9 pg (ref 26.0–34.0)
MCHC: 33.9 g/dL (ref 32.0–36.0)
MCV: 94.4 fL (ref 80.0–100.0)
Monocytes Absolute: 0.9 10*3/uL (ref 0.2–0.9)
Monocytes Relative: 10 %
Neutro Abs: 7 10*3/uL — ABNORMAL HIGH (ref 1.4–6.5)
Neutrophils Relative %: 75 %
Platelets: 337 10*3/uL (ref 150–440)
RBC: 3.28 MIL/uL — ABNORMAL LOW (ref 3.80–5.20)
RDW: 15.5 % — ABNORMAL HIGH (ref 11.5–14.5)
WBC: 9.3 10*3/uL (ref 3.6–11.0)

## 2018-04-03 LAB — COMPREHENSIVE METABOLIC PANEL
ALT: 19 U/L (ref 14–54)
AST: 36 U/L (ref 15–41)
Albumin: 2.6 g/dL — ABNORMAL LOW (ref 3.5–5.0)
Alkaline Phosphatase: 69 U/L (ref 38–126)
Anion gap: 8 (ref 5–15)
BUN: 18 mg/dL (ref 6–20)
CO2: 29 mmol/L (ref 22–32)
Calcium: 7.9 mg/dL — ABNORMAL LOW (ref 8.9–10.3)
Chloride: 100 mmol/L — ABNORMAL LOW (ref 101–111)
Creatinine, Ser: 1.05 mg/dL — ABNORMAL HIGH (ref 0.44–1.00)
GFR calc Af Amer: 53 mL/min — ABNORMAL LOW (ref >60)
GFR calc non Af Amer: 46 mL/min — ABNORMAL LOW (ref >60)
Glucose, Bld: 133 mg/dL — ABNORMAL HIGH (ref 65–99)
Potassium: 3.9 mmol/L (ref 3.5–5.1)
Sodium: 137 mmol/L (ref 135–145)
Total Bilirubin: 0.4 mg/dL (ref 0.3–1.2)
Total Protein: 5.8 g/dL — ABNORMAL LOW (ref 6.5–8.1)

## 2018-04-03 LAB — PROTIME-INR: INR: 1.3 — AB (ref 0.9–1.1)

## 2018-04-03 LAB — LACTIC ACID, PLASMA: Lactic Acid, Venous: 1.5 mmol/L (ref 0.5–1.9)

## 2018-04-03 MED ORDER — IOPAMIDOL (ISOVUE-370) INJECTION 76%
75.0000 mL | Freq: Once | INTRAVENOUS | Status: AC | PRN
  Administered 2018-04-04: 75 mL via INTRAVENOUS

## 2018-04-03 NOTE — Telephone Encounter (Signed)
° °  Jessica Erickson with [facility] called in stating she needs order for pt to continue taking aspirin 81mg . She also reported inr for 04/02/18 1.3 and inr has been drawn today 04/03/18.

## 2018-04-03 NOTE — Telephone Encounter (Signed)
Please advise 

## 2018-04-03 NOTE — ED Provider Notes (Signed)
[facility] Emergency Department Provider Note  Time seen: 11:20 PM  I have reviewed the triage vital signs and the nursing notes.   HISTORY  Chief Complaint Fever    HPI Jessica Erickson is a 84 y.o. female with a past medical history of COPD, diabetes, bilateral DVT with recent thrombectomy on Lovenox who presents to the emergency department for right arm swelling and fever.  According to patient and record review patient was recently diagnosed with large bilateral lower extremity DVTs.  Return to the hospital last week had thrombectomy is performed was discharged several days ago back to her nursing facility.  Today they noted the patient have a fever of 102 and sent her to the emergency department.  They also note for the past 1 week she has had progressively increased swelling of her right upper extremity, no erythema or obvious signs of cellulitis.  Currently the patient appears well she is hard of hearing, but is alert and interactive.  Answers questions appropriately.  Daughter is here with the patient also answering questions.  Patient appears well, does have an 89% room air saturation upon arrival.  No home O2 requirement at baseline.   Past Medical History:  Diagnosis Date  . Acoustic neuroma ([place])   . Allergy   . Asthma   . Bilateral swelling of feet    and legs  . Bladder infection   . CAD (coronary artery disease)   . Cataract   . Change in voice   . Compression fracture of body of thoracic vertebra (HCC)    T12 09/18/15 MRI s/p fall   . Constipation   . COPD (chronic obstructive pulmonary disease) ([place])    previous CXR with chronic interstitial lung dz   . CVA (cerebral vascular accident) ([place])   . Depression   . Diabetes ([place])    with neuropathy  . Diabetes mellitus, type 2 ([place])   . Diarrhea   . Double vision   . DVT (deep venous thrombosis) ([place])    right leg 10/2015 was on coumadin off as of 2017/2018 ; s/p IVC filter  . Enuresis   .  Eye pain, right   . Fall   . Fatty liver    09/15/15 also mildly dilated pancreatitic duct rec MRCP small sub cm cyst hemangioma speeln mild right hydronephrorossi and prox. hydroureter, kidney stones, mild scarring kidneys  . Female stress incontinence   . Flank pain   . GERD (gastroesophageal reflux disease)    with small hiatal hernia   . Hard of hearing   . Heart disease   . History of kidney problems   . Hyperlipidemia    mixed  . Hypertension   . Hypothyroidism, postsurgical   . Impaired mobility and ADLs    uses rolling walker has caretaker 24/7 at home  . Leg edema   . Mixed incontinence urge and stress (female)(female)   . Neuropathy   . Osteoarthritis    DDD spine   . Osteoporosis with fracture    T12 compression fracture  . Photophobia   . Pulmonary embolism ([place])    10/2015 off coumadin as of 04/2016  . Pulmonary HTN (HCC)    mild pulm HTN, echo 10/09/15 EF 32-95%JOACZ 1 dd, RV systolic pressure increased   . Recurrent UTI   . Sinus pressure   . Skin cancer    BCC jawline and scalp   . Thyroid disease    follows [facility]  . TIA (transient  ischemic attack)    MRI 2009/2010 neg stroke   . Trigeminal neuralgia    Dr. Tomi Bamberger s/p gamma knife x 2, on Tegretol since 2011/2012 no increase in dose >200 mg bid rec per family per neurology   . Urinary frequency   . Urinary, incontinence, stress female    Dr Erlene Quan urology     Patient Active Problem List   Diagnosis Date Noted  . DVT (deep vein thrombosis) in pregnancy ([place]) 03/27/2018  . Moderate protein-calorie malnutrition ([place]) 03/22/2018  . Orthostasis 03/05/2018  . Dizziness 03/04/2018  . COPD (chronic obstructive pulmonary disease) ([place]) 12/13/2017  . Anxiety and depression 12/13/2017  . Trigeminal neuralgia 12/13/2017  . GERD (gastroesophageal reflux disease) 12/13/2017  . Diabetes mellitus type 2, controlled ([place]) 12/13/2017  . Constipation 12/13/2017  . Insomnia 12/13/2017  . Hypothyroidism  12/13/2017  . UTI (urinary tract infection) 10/08/2015  . Collapsed vertebra, not elsewhere classified, thoracic region, initial encounter for fracture ([place]) 09/20/2015  . Basal cell carcinoma of scalp 04/06/2014  . Fothergill's neuralgia 08/05/2013  . Bladder infection, chronic 02/11/2013  . Female genuine stress incontinence 02/11/2013  . Incomplete bladder emptying 02/11/2013  . Intrinsic sphincter deficiency 02/11/2013  . Mixed incontinence 02/11/2013  . Excessive urination at night 02/11/2013  . Bladder retention 02/11/2013  . FOM (frequency of micturition) 02/11/2013  . Basal cell carcinoma of face 09/13/2011    Past Surgical History:  Procedure Laterality Date  . APPENDECTOMY     as a child, open  . BRAIN SURGERY     schwnnoma removal 1996   . brain tumor surgery    . BREAST SURGERY     breast bx  . CATARACT EXTRACTION    . CHOLECYSTECTOMY    . EYE SURGERY     cataract  . IVC FILTER PLACEMENT ([facility] HX)     Dr. Lucky Cowboy 10/2015   . LAPAROSCOPIC TUBAL LIGATION    . MOHS SURGERY     scalp 04/2014   . PERIPHERAL VASCULAR CATHETERIZATION N/A 10/11/2015   Procedure: IVC Filter Insertion;  Surgeon: Algernon Huxley, MD;  Location: [facility];  Service: Cardiovascular;  Laterality: N/A;  . PERIPHERAL VASCULAR THROMBECTOMY Bilateral 03/29/2018   Procedure: PERIPHERAL VASCULAR THROMBECTOMY;  Surgeon: Algernon Huxley, MD;  Location: [facility];  Service: Cardiovascular;  Laterality: Bilateral;  . PUBOVAGINAL SLING    . THROAT SURGERY    . THYROID SURGERY     tumor around vocal cords   . TOOTH EXTRACTION     winter 2018   . TOTAL THYROIDECTOMY  1976    Prior to Admission medications   Medication Sig Start Date End Date Taking? Authorizing Provider  acetaminophen (TYLENOL) 325 MG tablet Take 650 mg by mouth every 6 (six) hours as needed.    [provider]  Amino Acids-Protein Hydrolys (FEEDING SUPPLEMENT, PRO-STAT SUGAR FREE 64,) LIQD Take 30 mLs by mouth 2  (two) times daily between meals.    [provider]  aspirin EC 81 MG tablet Take 81 mg by mouth daily.    [provider]  atorvastatin (LIPITOR) 40 MG tablet Take 40 mg by mouth daily at 6 PM.     [provider]  bisacodyl (DULCOLAX) 5 MG EC tablet Take 1 tablet (5 mg total) by mouth daily as needed for moderate constipation. 03/08/18   Vaughan Basta, MD  budesonide-formoterol (SYMBICORT) 160-4.5 MCG/ACT inhaler Inhale 2 puffs into the lungs 2 (two) times daily. 01/31/18  McLean-Scocuzza, Nino Glow, MD  Calcium Carbonate-Vitamin D (CALCIUM 600+D) 600-400 MG-UNIT tablet Take 1 tablet by mouth daily.     [provider]  carbamazepine (TEGRETOL) 200 MG tablet Take 1 tablet (200 mg total) by mouth 2 (two) times daily. 03/08/18   Vaughan Basta, MD  docusate sodium (COLACE) 100 MG capsule Take 1 capsule (100 mg total) by mouth 2 (two) times daily. 03/08/18   Vaughan Basta, MD  enoxaparin (LOVENOX) 100 MG/ML injection Inject 0.95 mLs (95 mg total) into the skin 2 (two) times daily. 04/01/18   Fritzi Mandes, MD  fluticasone (FLONASE) 50 MCG/ACT nasal spray Place 1 spray into both nostrils daily.     [provider]  furosemide (LASIX) 40 MG tablet Take 1 tablet (40 mg total) by mouth daily. 03/27/18   McLean-Scocuzza, Nino Glow, MD  gabapentin (NEURONTIN) 100 MG capsule Take 100 mg by mouth 2 (two) times daily.  03/26/18   [provider]  guaiFENesin (MUCINEX) 600 MG 12 hr tablet Take 600 mg by mouth daily.     [provider]  HYDROcodone-acetaminophen (NORCO/VICODIN) 5-325 MG tablet Take 1 tablet by mouth every 6 (six) hours as needed for moderate pain or severe pain. 03/11/18   Toni Arthurs, NP  insulin regular (NOVOLIN R,HUMULIN R) 100 units/mL injection Inject 0-12 Units into the skin 3 (three) times daily before meals. 100 unit/mL; amt: Per Sliding Scale; If Blood Sugar is 176 to 250, give 3 Units.If Blood Sugar is 251 to  325, give 6 Units.If Blood Sugar is 326 to 450, give 10 Units. If Blood Sugar is greater than 450, give 12 Units and recheck in 1 hour. Notify MD/NP if >450 after recheck. Check blood sugar AC & HS    [provider]  levothyroxine (SYNTHROID, LEVOTHROID) 200 MCG tablet Take 200 mcg by mouth daily before breakfast. Except on Sunday. Do not take with other medications    [provider]  lidocaine (LIDODERM) 5 % Place 1 patch onto the skin daily. Remove & Discard patch within 12 hours or as directed by MD 04/01/18   Fritzi Mandes, MD  midodrine (PROAMATINE) 10 MG tablet Take 1 tablet (10 mg total) by mouth 3 (three) times daily with meals. 03/08/18   Vaughan Basta, MD  Multiple Vitamin (MULTIVITAMIN WITH MINERALS) TABS tablet Take 1 tablet by mouth daily.    [provider]  Nutritional Supplements (ENSURE ENLIVE PO) Take 1 Bottle by mouth 2 (two) times daily.    [provider]  pantoprazole (PROTONIX) 40 MG tablet Take 1 tablet (40 mg total) by mouth daily. 12/27/17   McLean-Scocuzza, Nino Glow, MD  polyethylene glycol powder (GLYCOLAX/MIRALAX) powder Take 17 g by mouth daily as needed for mild constipation.  12/10/15   [provider]  tiotropium (SPIRIVA) 18 MCG inhalation capsule Place 1 capsule (18 mcg total) into inhaler and inhale daily. 01/10/18   McLean-Scocuzza, Nino Glow, MD  traZODone (DESYREL) 50 MG tablet Take 0.5 tablets (25 mg total) by mouth at bedtime as needed for sleep. 03/08/18   Vaughan Basta, MD  warfarin (COUMADIN) 7.5 MG tablet Take 1 tablet (7.5 mg total) by mouth daily at 6 PM. 04/01/18   Fritzi Mandes, MD    Allergies  Allergen Reactions  . Penicillins Shortness Of Breath, Rash and Other (See Comments)    Has patient had a PCN reaction causing immediate rash, facial/tongue/throat swelling, SOB or lightheadedness with hypotension: Yes Has patient had a PCN reaction causing severe rash involving  mucus membranes or skin necrosis:  No Has patient had a PCN reaction that required hospitalization No Has patient had a PCN reaction occurring within the last 10 years: No If all of the above answers are "NO", then may proceed with Cephalosporin use.  . Sulfa Antibiotics Shortness Of Breath, Rash and Other (See Comments)  . Amitiza [Lubiprostone]     N/v/d  . Aspirin Other (See Comments)    Reaction:  Unknown  Other reaction(s): Bleeding (intolerance) Per patient " causes nose to bleed" Can take 81 mg daily without any complications Other reaction(s): "bloody nose"   . Penicillin G Other (See Comments)    Has patient had a PCN reaction causing immediate rash, facial/tongue/throat swelling, SOB or lightheadedness with hypotension: No Has patient had a PCN reaction causing severe rash involving mucus membranes or skin necrosis: Unknown Has patient had a PCN reaction that required hospitalization: Unknown Has patient had a PCN reaction occurring within the last 10 years: Unknown If all of the above answers are "NO", then may proceed with Cephalosporin use.    Family History  Problem Relation Age of Onset  . Heart disease Mother   . Diabetes Father   . Cancer Daughter        breast ca x 2 s/p mastectomy     Social History Social History   Tobacco Use  . Smoking status: Former Smoker    Types: Cigarettes    Last attempt to quit: 09/20/1995    Years since quitting: 22.5  . Smokeless tobacco: Never Used  . Tobacco comment: quit 1996 smoked 20 years max 8 cig qd   Substance Use Topics  . Alcohol use: No  . Drug use: No    Review of Systems Constitutional: For fever to 102 at nursing home ENT: Negative for recent illness/congestion Cardiovascular: Negative for chest pain. Respiratory: Negative for shortness of breath. Gastrointestinal: Negative for abdominal pain Genitourinary: Negative for urinary compaints Musculoskeletal: Right upper extremity swelling Skin: Negative for skin complaints  Neurological:  Negative for headache All other ROS negative  ____________________________________________   PHYSICAL EXAM:  VITAL SIGNS: ED Triage Vitals  Enc Vitals Group     BP 04/03/18 2142 (!) 104/47     Pulse Rate 04/03/18 2142 74     Resp 04/03/18 2142 14     Temp 04/03/18 2142 98.8 F (37.1 C)     Temp Source 04/03/18 2142 Oral     SpO2 04/03/18 2142 (!) 89 %     Weight 04/03/18 2141 200 lb (90.7 kg)     Height 04/03/18 2141 5\' 9"  (1.753 m)     Head Circumference --      Peak Flow --      Pain Score 04/03/18 2140 0     Pain Loc --      Pain Edu? --      Excl. in [place]? --     Constitutional: Alert and oriented. Well appearing and in no distress. Eyes: Normal exam ENT   Head: Normocephalic and atraumatic.   Mouth/Throat: Mucous membranes are moist. Cardiovascular: Normal rate, regular rhythm. Respiratory: Normal respiratory effort without tachypnea nor retractions. Breath sounds are clear Gastrointestinal: Soft and nontender. No distention.   Musculoskeletal: Patient has moderate right upper extremity edema, appears to be neurovascular intact.  Mildly tender to palpation.  No obvious erythema.  Lower extremity edema, known DVTs. Neurologic:  Normal speech and language. No gross focal neurologic deficits Skin:  Skin is warm, dry and intact.  Psychiatric: Mood and affect are normal  ____________________________________________    EKG  EKG reviewed and interpreted by myself shows sinus rhythm at 74 bpm with a narrow QRS, normal axis, largely normal intervals with nonspecific ST changes.  ____________________________________________    RADIOLOGY  Chest x-ray shows atelectasis.  ____________________________________________   INITIAL IMPRESSION / ASSESSMENT AND PLAN / ED COURSE  Pertinent labs & imaging results that were available during my care of the patient were reviewed by me and considered in my medical decision making (see chart for details).  She presents to  the emergency department with reported fever to 102 at her nursing facility.  Upon arrival patient found to be hypoxic at 89% on room air, no home O2 baseline requirement.  Given the patient's recent diagnosis of significant/extensive DVT status post thrombectomy now with a fever to 102 at nursing facility although afebrile here and a hypoxic saturation I highly suspect development of either pneumonia versus pulmonary embolism.  We will check Lyme's, chest x-ray.  As long as her renal function can tolerate we will proceed with a CT angiography of the chest to further evaluate.  We will also obtain a right upper extremity ultrasound to help rule out DVT in the right upper extremity.  Patient and family agreeable this plan of care.  Patient's labs are largely at baseline, normal white blood cell count.  Normal lactic acid.  Chemistry largely at baseline. ____________________________________________   FINAL CLINICAL IMPRESSION(S) / ED DIAGNOSES  Hypoxia Fever    Harvest Dark, MD 04/08/18 1525

## 2018-04-03 NOTE — Telephone Encounter (Signed)
Call or fax [facility] inr 1.3 04/02/18  goal 2-3  Increase coumadin to 10 mg qd and continue lovenox 95 mg 2x per day every 12 hours or 142 mg qd  Until INR 2-3   Thanks [place]

## 2018-04-03 NOTE — ED Triage Notes (Signed)
Pt arrived via EMS from [place] d/t reported fever os 102.1. Pt temp is 98.8 upon arrival. EMS reports pt was discharged Monday after being seen for DVTs. EMS reports facility c/o right arm edema and recent bilateral leg edema.

## 2018-04-03 NOTE — Telephone Encounter (Signed)
Copied from [place] [phone]. Topic: Inquiry >> Apr 03, 2018  3:56 PM Oliver Pila B wrote: Reason for CRM: Shireen called from [facility] and asked for the OT AND PT orders to be postponed until next week since the pt just had a procedure done, call [facility] if needed @ [phone] a VM can be left if needed

## 2018-04-03 NOTE — Telephone Encounter (Signed)
Jessica Erickson has been notified. Also information has been faxed.

## 2018-04-03 NOTE — Telephone Encounter (Signed)
Jessica Erickson  At [facility] called giving  Lab results drawn yesterday  Protime is 13.5     Inr is  1.3   She  Has  further questions   3  Way conference  With [facility] for further clarification of questians

## 2018-04-04 ENCOUNTER — Encounter: Payer: Self-pay | Admitting: Emergency Medicine

## 2018-04-04 ENCOUNTER — Emergency Department: Payer: Medicare Other

## 2018-04-04 ENCOUNTER — Telehealth: Payer: Self-pay | Admitting: Internal Medicine

## 2018-04-04 ENCOUNTER — Ambulatory Visit: Payer: Medicare Other | Admitting: Internal Medicine

## 2018-04-04 DIAGNOSIS — Z87891 Personal history of nicotine dependence: Secondary | ICD-10-CM | POA: Diagnosis not present

## 2018-04-04 DIAGNOSIS — Y95 Nosocomial condition: Secondary | ICD-10-CM | POA: Diagnosis present

## 2018-04-04 DIAGNOSIS — Z8249 Family history of ischemic heart disease and other diseases of the circulatory system: Secondary | ICD-10-CM | POA: Diagnosis not present

## 2018-04-04 DIAGNOSIS — R509 Fever, unspecified: Secondary | ICD-10-CM

## 2018-04-04 DIAGNOSIS — Z9049 Acquired absence of other specified parts of digestive tract: Secondary | ICD-10-CM | POA: Diagnosis not present

## 2018-04-04 DIAGNOSIS — J189 Pneumonia, unspecified organism: Secondary | ICD-10-CM | POA: Diagnosis present

## 2018-04-04 DIAGNOSIS — A419 Sepsis, unspecified organism: Secondary | ICD-10-CM | POA: Diagnosis present

## 2018-04-04 DIAGNOSIS — Z794 Long term (current) use of insulin: Secondary | ICD-10-CM | POA: Diagnosis not present

## 2018-04-04 DIAGNOSIS — Z7951 Long term (current) use of inhaled steroids: Secondary | ICD-10-CM | POA: Diagnosis not present

## 2018-04-04 DIAGNOSIS — E1122 Type 2 diabetes mellitus with diabetic chronic kidney disease: Secondary | ICD-10-CM | POA: Diagnosis present

## 2018-04-04 DIAGNOSIS — Z79899 Other long term (current) drug therapy: Secondary | ICD-10-CM | POA: Diagnosis not present

## 2018-04-04 DIAGNOSIS — R609 Edema, unspecified: Secondary | ICD-10-CM | POA: Diagnosis present

## 2018-04-04 DIAGNOSIS — M810 Age-related osteoporosis without current pathological fracture: Secondary | ICD-10-CM | POA: Diagnosis present

## 2018-04-04 DIAGNOSIS — Z8673 Personal history of transient ischemic attack (TIA), and cerebral infarction without residual deficits: Secondary | ICD-10-CM | POA: Diagnosis not present

## 2018-04-04 DIAGNOSIS — N183 Chronic kidney disease, stage 3 (moderate): Secondary | ICD-10-CM | POA: Diagnosis present

## 2018-04-04 DIAGNOSIS — Z86718 Personal history of other venous thrombosis and embolism: Secondary | ICD-10-CM | POA: Diagnosis not present

## 2018-04-04 DIAGNOSIS — Z833 Family history of diabetes mellitus: Secondary | ICD-10-CM | POA: Diagnosis not present

## 2018-04-04 DIAGNOSIS — J440 Chronic obstructive pulmonary disease with acute lower respiratory infection: Secondary | ICD-10-CM | POA: Diagnosis present

## 2018-04-04 DIAGNOSIS — Z79890 Hormone replacement therapy (postmenopausal): Secondary | ICD-10-CM | POA: Diagnosis not present

## 2018-04-04 DIAGNOSIS — Z86711 Personal history of pulmonary embolism: Secondary | ICD-10-CM | POA: Diagnosis not present

## 2018-04-04 DIAGNOSIS — K219 Gastro-esophageal reflux disease without esophagitis: Secondary | ICD-10-CM | POA: Diagnosis present

## 2018-04-04 DIAGNOSIS — E890 Postprocedural hypothyroidism: Secondary | ICD-10-CM | POA: Diagnosis present

## 2018-04-04 DIAGNOSIS — Z7901 Long term (current) use of anticoagulants: Secondary | ICD-10-CM | POA: Diagnosis not present

## 2018-04-04 DIAGNOSIS — Z85828 Personal history of other malignant neoplasm of skin: Secondary | ICD-10-CM | POA: Diagnosis not present

## 2018-04-04 DIAGNOSIS — Z7982 Long term (current) use of aspirin: Secondary | ICD-10-CM | POA: Diagnosis not present

## 2018-04-04 DIAGNOSIS — I959 Hypotension, unspecified: Secondary | ICD-10-CM | POA: Diagnosis present

## 2018-04-04 DIAGNOSIS — E785 Hyperlipidemia, unspecified: Secondary | ICD-10-CM | POA: Diagnosis present

## 2018-04-04 LAB — TSH: TSH: 9.58 u[IU]/mL — ABNORMAL HIGH (ref 0.350–4.500)

## 2018-04-04 LAB — URINALYSIS, COMPLETE (UACMP) WITH MICROSCOPIC
Bilirubin Urine: NEGATIVE
Glucose, UA: NEGATIVE mg/dL
Ketones, ur: NEGATIVE mg/dL
Nitrite: POSITIVE — AB
Protein, ur: 30 mg/dL — AB
Specific Gravity, Urine: >1.046 — ABNORMAL HIGH (ref 1.005–1.030)
WBC, UA: >50 WBC/hpf — ABNORMAL HIGH (ref 0–5)
pH: 6 (ref 5.0–8.0)

## 2018-04-04 LAB — TROPONIN I: Troponin I: <0.03 ng/mL (ref <0.03)

## 2018-04-04 LAB — LACTIC ACID, PLASMA: Lactic Acid, Venous: 0.9 mmol/L (ref 0.5–1.9)

## 2018-04-04 LAB — GLUCOSE, CAPILLARY
Glucose-Capillary: 115 mg/dL — ABNORMAL HIGH (ref 65–99)
Glucose-Capillary: 84 mg/dL (ref 65–99)
Glucose-Capillary: 127 mg/dL — ABNORMAL HIGH (ref 65–99)
Glucose-Capillary: 112 mg/dL — ABNORMAL HIGH (ref 65–99)
Glucose-Capillary: 107 mg/dL — ABNORMAL HIGH (ref 65–99)

## 2018-04-04 LAB — PROTIME-INR
INR: 1.6
Prothrombin Time: 18.9 seconds — ABNORMAL HIGH (ref 11.4–15.2)

## 2018-04-04 LAB — PROCALCITONIN: Procalcitonin: <0.1 ng/mL

## 2018-04-04 LAB — BRAIN NATRIURETIC PEPTIDE: B Natriuretic Peptide: 83 pg/mL (ref 0.0–100.0)

## 2018-04-04 LAB — MRSA PCR SCREENING: MRSA by PCR: NEGATIVE

## 2018-04-04 MED ORDER — TIOTROPIUM BROMIDE MONOHYDRATE 18 MCG IN CAPS
18.0000 ug | ORAL_CAPSULE | Freq: Every day | RESPIRATORY_TRACT | Status: DC
  Administered 2018-04-04 – 2018-04-08 (×5): 18 ug via RESPIRATORY_TRACT
  Filled 2018-04-04: qty 5

## 2018-04-04 MED ORDER — HYDROCODONE-ACETAMINOPHEN 5-325 MG PO TABS
1.0000 | ORAL_TABLET | Freq: Four times a day (QID) | ORAL | Status: DC | PRN
  Administered 2018-04-06 (×3): 1 via ORAL
  Filled 2018-04-04 (×5): qty 1

## 2018-04-04 MED ORDER — FLUTICASONE PROPIONATE 50 MCG/ACT NA SUSP
1.0000 | Freq: Every day | NASAL | Status: DC
  Administered 2018-04-04 – 2018-04-08 (×5): 1 via NASAL
  Filled 2018-04-04: qty 16

## 2018-04-04 MED ORDER — ATORVASTATIN CALCIUM 20 MG PO TABS
40.0000 mg | ORAL_TABLET | Freq: Every day | ORAL | Status: DC
  Administered 2018-04-04 – 2018-04-07 (×4): 40 mg via ORAL
  Filled 2018-04-04 (×4): qty 2

## 2018-04-04 MED ORDER — SODIUM CHLORIDE 0.9 % IV SOLN
2.0000 g | Freq: Three times a day (TID) | INTRAVENOUS | Status: DC
  Filled 2018-04-04 (×3): qty 2

## 2018-04-04 MED ORDER — DOCUSATE SODIUM 100 MG PO CAPS
100.0000 mg | ORAL_CAPSULE | Freq: Two times a day (BID) | ORAL | Status: DC
  Administered 2018-04-04 – 2018-04-08 (×9): 100 mg via ORAL
  Filled 2018-04-04 (×9): qty 1

## 2018-04-04 MED ORDER — ALBUTEROL SULFATE (2.5 MG/3ML) 0.083% IN NEBU
2.5000 mg | INHALATION_SOLUTION | Freq: Three times a day (TID) | RESPIRATORY_TRACT | Status: DC
  Administered 2018-04-04 – 2018-04-08 (×10): 2.5 mg via RESPIRATORY_TRACT
  Filled 2018-04-04 (×11): qty 3

## 2018-04-04 MED ORDER — ASPIRIN EC 81 MG PO TBEC
81.0000 mg | DELAYED_RELEASE_TABLET | Freq: Every day | ORAL | Status: DC
  Administered 2018-04-04 – 2018-04-08 (×5): 81 mg via ORAL
  Filled 2018-04-04 (×5): qty 1

## 2018-04-04 MED ORDER — VANCOMYCIN HCL IN DEXTROSE 1-5 GM/200ML-% IV SOLN
1000.0000 mg | Freq: Once | INTRAVENOUS | Status: AC
  Administered 2018-04-04: 1000 mg via INTRAVENOUS
  Filled 2018-04-04: qty 200

## 2018-04-04 MED ORDER — LEVOTHYROXINE SODIUM 100 MCG PO TABS
200.0000 ug | ORAL_TABLET | Freq: Every day | ORAL | Status: DC
  Administered 2018-04-04: 200 ug via ORAL
  Filled 2018-04-04 (×2): qty 2

## 2018-04-04 MED ORDER — FLUTICASONE FUROATE-VILANTEROL 200-25 MCG/INH IN AEPB
1.0000 | INHALATION_SPRAY | Freq: Every day | RESPIRATORY_TRACT | Status: DC
  Administered 2018-04-04 – 2018-04-08 (×5): 1 via RESPIRATORY_TRACT
  Filled 2018-04-04: qty 28

## 2018-04-04 MED ORDER — INSULIN ASPART 100 UNIT/ML ~~LOC~~ SOLN
0.0000 [IU] | Freq: Three times a day (TID) | SUBCUTANEOUS | Status: DC
  Administered 2018-04-05 – 2018-04-07 (×2): 2 [IU] via SUBCUTANEOUS
  Filled 2018-04-04 (×3): qty 1

## 2018-04-04 MED ORDER — SODIUM CHLORIDE 0.9 % IV SOLN
2.0000 g | Freq: Two times a day (BID) | INTRAVENOUS | Status: DC
  Administered 2018-04-04 – 2018-04-05 (×2): 2 g via INTRAVENOUS
  Filled 2018-04-04 (×4): qty 2

## 2018-04-04 MED ORDER — VANCOMYCIN HCL IN DEXTROSE 1-5 GM/200ML-% IV SOLN
1000.0000 mg | INTRAVENOUS | Status: DC
  Administered 2018-04-04: 1000 mg via INTRAVENOUS
  Filled 2018-04-04: qty 200

## 2018-04-04 MED ORDER — DOCUSATE SODIUM 100 MG PO CAPS: 100.0000 mg | ORAL_CAPSULE | Freq: Two times a day (BID) | ORAL | Status: DC

## 2018-04-04 MED ORDER — FENTANYL CITRATE (PF) 100 MCG/2ML IJ SOLN
INTRAMUSCULAR | Status: AC
  Administered 2018-04-04: 50 ug via INTRAVENOUS
  Filled 2018-04-04: qty 2

## 2018-04-04 MED ORDER — ALBUTEROL SULFATE (2.5 MG/3ML) 0.083% IN NEBU
2.5000 mg | INHALATION_SOLUTION | Freq: Three times a day (TID) | RESPIRATORY_TRACT | Status: DC
  Filled 2018-04-04: qty 3

## 2018-04-04 MED ORDER — ACETAMINOPHEN 325 MG PO TABS
650.0000 mg | ORAL_TABLET | Freq: Four times a day (QID) | ORAL | Status: DC | PRN
  Administered 2018-04-07: 650 mg via ORAL
  Filled 2018-04-04: qty 2

## 2018-04-04 MED ORDER — CARBAMAZEPINE 200 MG PO TABS
200.0000 mg | ORAL_TABLET | Freq: Two times a day (BID) | ORAL | Status: DC
  Administered 2018-04-05 – 2018-04-08 (×7): 200 mg via ORAL
  Filled 2018-04-04 (×8): qty 1

## 2018-04-04 MED ORDER — SODIUM CHLORIDE 0.9 % IV SOLN
2.0000 g | Freq: Once | INTRAVENOUS | Status: AC
  Administered 2018-04-04: 2 g via INTRAVENOUS
  Filled 2018-04-04: qty 2

## 2018-04-04 MED ORDER — POLYETHYLENE GLYCOL 3350 17 GM/SCOOP PO POWD
17.0000 g | Freq: Every day | ORAL | Status: DC | PRN
  Filled 2018-04-04: qty 255

## 2018-04-04 MED ORDER — SODIUM CHLORIDE 0.9 % IV BOLUS
500.0000 mL | Freq: Once | INTRAVENOUS | Status: AC
  Administered 2018-04-04: 500 mL via INTRAVENOUS

## 2018-04-04 MED ORDER — SODIUM CHLORIDE 0.9 % IV SOLN
INTRAVENOUS | Status: DC
  Administered 2018-04-04 – 2018-04-05 (×2): via INTRAVENOUS

## 2018-04-04 MED ORDER — ACETYLCYSTEINE 20 % IN SOLN
3.0000 mL | Freq: Three times a day (TID) | RESPIRATORY_TRACT | Status: DC
  Filled 2018-04-04: qty 4

## 2018-04-04 MED ORDER — TRAZODONE HCL 50 MG PO TABS
25.0000 mg | ORAL_TABLET | Freq: Every evening | ORAL | Status: DC | PRN
  Administered 2018-04-05 – 2018-04-07 (×3): 25 mg via ORAL
  Filled 2018-04-04 (×4): qty 1

## 2018-04-04 MED ORDER — KETOROLAC TROMETHAMINE 30 MG/ML IJ SOLN
INTRAMUSCULAR | Status: AC
  Administered 2018-04-04: 15 mg via INTRAVENOUS
  Filled 2018-04-04: qty 1

## 2018-04-04 MED ORDER — ONDANSETRON HCL 4 MG/2ML IJ SOLN: 4.0000 mg | Freq: Four times a day (QID) | INTRAMUSCULAR | Status: DC | PRN

## 2018-04-04 MED ORDER — KETOROLAC TROMETHAMINE 30 MG/ML IJ SOLN
15.0000 mg | Freq: Once | INTRAMUSCULAR | Status: AC
  Administered 2018-04-04: 15 mg via INTRAVENOUS

## 2018-04-04 MED ORDER — ONDANSETRON HCL 4 MG PO TABS: 4.0000 mg | ORAL_TABLET | Freq: Four times a day (QID) | ORAL | Status: DC | PRN

## 2018-04-04 MED ORDER — GUAIFENESIN ER 600 MG PO TB12
600.0000 mg | ORAL_TABLET | Freq: Every day | ORAL | Status: DC
  Administered 2018-04-04 – 2018-04-08 (×5): 600 mg via ORAL
  Filled 2018-04-04 (×5): qty 1

## 2018-04-04 MED ORDER — FENTANYL CITRATE (PF) 100 MCG/2ML IJ SOLN
50.0000 ug | Freq: Once | INTRAMUSCULAR | Status: AC
  Administered 2018-04-04: 50 ug via INTRAVENOUS

## 2018-04-04 MED ORDER — GABAPENTIN 100 MG PO CAPS
100.0000 mg | ORAL_CAPSULE | Freq: Two times a day (BID) | ORAL | Status: DC
  Administered 2018-04-04 – 2018-04-08 (×9): 100 mg via ORAL
  Filled 2018-04-04 (×9): qty 1

## 2018-04-04 MED ORDER — ACETAMINOPHEN 650 MG RE SUPP: 650.0000 mg | Freq: Four times a day (QID) | RECTAL | Status: DC | PRN

## 2018-04-04 MED ORDER — ADULT MULTIVITAMIN W/MINERALS CH
1.0000 | ORAL_TABLET | Freq: Every day | ORAL | Status: DC
  Administered 2018-04-04 – 2018-04-08 (×5): 1 via ORAL
  Filled 2018-04-04 (×5): qty 1

## 2018-04-04 MED ORDER — INSULIN ASPART 100 UNIT/ML ~~LOC~~ SOLN: 0.0000 [IU] | Freq: Every day | SUBCUTANEOUS | Status: DC

## 2018-04-04 MED ORDER — ENSURE ENLIVE PO LIQD
237.0000 mL | Freq: Two times a day (BID) | ORAL | Status: DC
  Administered 2018-04-05 – 2018-04-07 (×5): 237 mL via ORAL

## 2018-04-04 MED ORDER — CALCIUM CARBONATE-VITAMIN D 500-200 MG-UNIT PO TABS
1.0000 | ORAL_TABLET | Freq: Every day | ORAL | Status: DC
  Administered 2018-04-04 – 2018-04-08 (×5): 1 via ORAL
  Filled 2018-04-04 (×5): qty 1

## 2018-04-04 MED ORDER — MIDODRINE HCL 5 MG PO TABS
10.0000 mg | ORAL_TABLET | Freq: Three times a day (TID) | ORAL | Status: DC
  Administered 2018-04-04 – 2018-04-08 (×13): 10 mg via ORAL
  Filled 2018-04-04 (×15): qty 2

## 2018-04-04 MED ORDER — ENOXAPARIN SODIUM 100 MG/ML ~~LOC~~ SOLN
1.0000 mg/kg | Freq: Two times a day (BID) | SUBCUTANEOUS | Status: DC
  Administered 2018-04-04 – 2018-04-07 (×7): 90 mg via SUBCUTANEOUS
  Filled 2018-04-04 (×8): qty 1

## 2018-04-04 MED ORDER — ENOXAPARIN SODIUM 40 MG/0.4ML ~~LOC~~ SOLN: 40.0000 mg | SUBCUTANEOUS | Status: DC

## 2018-04-04 MED ORDER — CARBAMAZEPINE 200 MG PO TABS
200.0000 mg | ORAL_TABLET | Freq: Two times a day (BID) | ORAL | Status: DC
  Administered 2018-04-04 (×2): 200 mg via ORAL
  Filled 2018-04-04 (×4): qty 1

## 2018-04-04 MED ORDER — FUROSEMIDE 40 MG PO TABS
40.0000 mg | ORAL_TABLET | Freq: Every day | ORAL | Status: DC
  Filled 2018-04-04: qty 1

## 2018-04-04 MED ORDER — PANTOPRAZOLE SODIUM 40 MG PO TBEC
40.0000 mg | DELAYED_RELEASE_TABLET | Freq: Every day | ORAL | Status: DC
  Administered 2018-04-04 – 2018-04-08 (×5): 40 mg via ORAL
  Filled 2018-04-04 (×5): qty 1

## 2018-04-04 MED ORDER — ACETYLCYSTEINE 20 % IN SOLN
3.0000 mL | Freq: Three times a day (TID) | RESPIRATORY_TRACT | Status: DC
  Administered 2018-04-04 – 2018-04-05 (×4): 3 mL via RESPIRATORY_TRACT
  Filled 2018-04-04 (×5): qty 4

## 2018-04-04 MED ORDER — POLYETHYLENE GLYCOL 3350 17 G PO PACK
17.0000 g | PACK | Freq: Every day | ORAL | Status: DC | PRN
  Administered 2018-04-07 – 2018-04-08 (×2): 17 g via ORAL
  Filled 2018-04-04 (×3): qty 1

## 2018-04-04 NOTE — Progress Notes (Signed)
Pharmacy Antibiotic Note  Jessica Erickson is a 84 y.o. female admitted on 04/03/2018 with pneumonia.  Pharmacy has been consulted for vancomycin and aztreonam dosing.  Plan: DW 76kg  VD 53L kei 0.041 hr-1  t1/2 17 hours Vancomycin 1 gram q 18 hours ordered with stacked dosing. Level before 5th dose. Goal trough 15-20.  Aztreonam 2 grams q 8 hours ordered with stacked dosing.  Height: 5\' 9"  (175.3 cm) Weight: 200 lb (90.7 kg) IBW/kg (Calculated) : 66.2  Temp (24hrs), Avg:98.8 F (37.1 C), Min:98.8 F (37.1 C), Max:98.8 F (37.1 C)  Recent Labs  Lab 03/29/18 0118 03/30/18 0745 03/31/18 0458 04/03/18 2149 04/03/18 2328  WBC 6.4 6.6 7.0 9.3  --   CREATININE  --  1.28*  --  1.05*  --   LATICACIDVEN  --   --   --  1.5 0.9    Estimated Creatinine Clearance: 43.6 mL/min (A) (by C-G formula based on SCr of 1.05 mg/dL (H)).    Allergies  Allergen Reactions  . Penicillins Shortness Of Breath, Rash and Other (See Comments)    Has patient had a PCN reaction causing immediate rash, facial/tongue/throat swelling, SOB or lightheadedness with hypotension: Yes Has patient had a PCN reaction causing severe rash involving mucus membranes or skin necrosis: No Has patient had a PCN reaction that required hospitalization No Has patient had a PCN reaction occurring within the last 10 years: No If all of the above answers are "NO", then may proceed with Cephalosporin use.  . Sulfa Antibiotics Shortness Of Breath, Rash and Other (See Comments)  . Amitiza [Lubiprostone]     N/v/d  . Aspirin Other (See Comments)    Reaction:  Unknown  Other reaction(s): Bleeding (intolerance) Per patient " causes nose to bleed" Can take 81 mg daily without any complications Other reaction(s): "bloody nose"   . Penicillin G Other (See Comments)    Has patient had a PCN reaction causing immediate rash, facial/tongue/throat swelling, SOB or lightheadedness with hypotension: No Has patient had a PCN reaction  causing severe rash involving mucus membranes or skin necrosis: Unknown Has patient had a PCN reaction that required hospitalization: Unknown Has patient had a PCN reaction occurring within the last 10 years: Unknown If all of the above answers are "NO", then may proceed with Cephalosporin use.    Antimicrobials this admission: Vancomycin, aztreonam 5/2  >>    >>   Dose adjustments this admission:   Microbiology results: 5/1 BCx: pending 5/2 MRSA PCR: pending      5/2 UA: LE(+) NO2(+)  WBC >50 5/2 CT chest: atelectasis/pneumonia  Thank you for allowing pharmacy to be a part of this patient's care.  Kathaleya Mcduffee S 04/04/2018 4:18 AM

## 2018-04-04 NOTE — H&P (Signed)
Jessica Erickson is an 84 y.o. female.   Chief Complaint:  HPI: Patient with past medical history of recent thrombectomy, remote CVA, hypertension, diabetes and COPD presents to the emergency department with fever.  T-max 102.5 degrees Fahrenheit.  The patient complained of shortness of breath and due to her history of DVT in the emergency department staff performed CTA of chest which revealed foci of pneumonia interspersed among atelectasis.  Patient complains of right upper extremity pain due to another DVT found in that arm.  Due to her recent hospitalization patient was started on broad-spectrum antibiotics prior to calling the hospitalist service for admission.   Past Medical History:  Diagnosis Date  . Acoustic neuroma ([place])   . Allergy   . Asthma   . Bilateral swelling of feet    and legs  . Bladder infection   . CAD (coronary artery disease)   . Cataract   . Change in voice   . Compression fracture of body of thoracic vertebra (HCC)    T12 09/18/15 MRI s/p fall   . Constipation   . COPD (chronic obstructive pulmonary disease) ([place])    previous CXR with chronic interstitial lung dz   . CVA (cerebral vascular accident) ([place])   . Depression   . Diabetes ([place])    with neuropathy  . Diabetes mellitus, type 2 ([place])   . Diarrhea   . Double vision   . DVT (deep venous thrombosis) ([place])    right leg 10/2015 was on coumadin off as of 2017/2018 ; s/p IVC filter  . Enuresis   . Eye pain, right   . Fall   . Fatty liver    09/15/15 also mildly dilated pancreatitic duct rec MRCP small sub cm cyst hemangioma speeln mild right hydronephrorossi and prox. hydroureter, kidney stones, mild scarring kidneys  . Female stress incontinence   . Flank pain   . GERD (gastroesophageal reflux disease)    with small hiatal hernia   . Hard of hearing   . Heart disease   . History of kidney problems   . Hyperlipidemia    mixed  . Hypertension   . Hypothyroidism, postsurgical   . Impaired mobility  and ADLs    uses rolling walker has caretaker 24/7 at home  . Leg edema   . Mixed incontinence urge and stress (female)(female)   . Neuropathy   . Osteoarthritis    DDD spine   . Osteoporosis with fracture    T12 compression fracture  . Photophobia   . Pulmonary embolism ([place])    10/2015 off coumadin as of 04/2016  . Pulmonary HTN (HCC)    mild pulm HTN, echo 10/09/15 EF 41-66%AYTKZ 1 dd, RV systolic pressure increased   . Recurrent UTI   . Sinus pressure   . Skin cancer    BCC jawline and scalp   . Thyroid disease    follows [facility]  . TIA (transient ischemic attack)    MRI 2009/2010 neg stroke   . Trigeminal neuralgia    Dr. Tomi Bamberger s/p gamma knife x 2, on Tegretol since 2011/2012 no increase in dose >200 mg bid rec per family per neurology   . Urinary frequency   . Urinary, incontinence, stress female    Dr Erlene Quan urology     Past Surgical History:  Procedure Laterality Date  . APPENDECTOMY     as a child, open  . BRAIN SURGERY     schwnnoma removal 1996   . brain  tumor surgery    . BREAST SURGERY     breast bx  . CATARACT EXTRACTION    . CHOLECYSTECTOMY    . EYE SURGERY     cataract  . IVC FILTER PLACEMENT ([facility] HX)     Dr. Lucky Cowboy 10/2015   . LAPAROSCOPIC TUBAL LIGATION    . MOHS SURGERY     scalp 04/2014   . PERIPHERAL VASCULAR CATHETERIZATION N/A 10/11/2015   Procedure: IVC Filter Insertion;  Surgeon: Algernon Huxley, MD;  Location: [facility];  Service: Cardiovascular;  Laterality: N/A;  . PERIPHERAL VASCULAR THROMBECTOMY Bilateral 03/29/2018   Procedure: PERIPHERAL VASCULAR THROMBECTOMY;  Surgeon: Algernon Huxley, MD;  Location: [facility];  Service: Cardiovascular;  Laterality: Bilateral;  . PUBOVAGINAL SLING    . THROAT SURGERY    . THYROID SURGERY     tumor around vocal cords   . TOOTH EXTRACTION     winter 2018   . TOTAL THYROIDECTOMY  1976    Family History  Problem Relation Age of Onset  . Heart disease Mother   . Diabetes  Father   . Cancer Daughter        breast ca x 2 s/p mastectomy    Social History:  reports that she quit smoking about 22 years ago. Her smoking use included cigarettes. She has never used smokeless tobacco. She reports that she does not drink alcohol or use drugs.  Allergies:  Allergies  Allergen Reactions  . Penicillins Shortness Of Breath, Rash and Other (See Comments)    Has patient had a PCN reaction causing immediate rash, facial/tongue/throat swelling, SOB or lightheadedness with hypotension: Yes Has patient had a PCN reaction causing severe rash involving mucus membranes or skin necrosis: No Has patient had a PCN reaction that required hospitalization No Has patient had a PCN reaction occurring within the last 10 years: No If all of the above answers are "NO", then may proceed with Cephalosporin use.  . Sulfa Antibiotics Shortness Of Breath, Rash and Other (See Comments)  . Amitiza [Lubiprostone]     N/v/d  . Aspirin Other (See Comments)    Reaction:  Unknown  Other reaction(s): Bleeding (intolerance) Per patient " causes nose to bleed" Can take 81 mg daily without any complications Other reaction(s): "bloody nose"   . Penicillin G Other (See Comments)    Has patient had a PCN reaction causing immediate rash, facial/tongue/throat swelling, SOB or lightheadedness with hypotension: No Has patient had a PCN reaction causing severe rash involving mucus membranes or skin necrosis: Unknown Has patient had a PCN reaction that required hospitalization: Unknown Has patient had a PCN reaction occurring within the last 10 years: Unknown If all of the above answers are "NO", then may proceed with Cephalosporin use.    Prior to Admission medications   Medication Sig Start Date End Date Taking? Authorizing Provider  acetaminophen (TYLENOL) 325 MG tablet Take 650 mg by mouth every 6 (six) hours as needed.   Yes [provider]  Amino Acids-Protein Hydrolys (FEEDING SUPPLEMENT,  PRO-STAT SUGAR FREE 64,) LIQD Take 30 mLs by mouth 2 (two) times daily between meals.   Yes [provider]  aspirin EC 81 MG tablet Take 81 mg by mouth daily.   Yes [provider]  atorvastatin (LIPITOR) 40 MG tablet Take 40 mg by mouth daily at 6 PM.    Yes [provider]  bisacodyl (DULCOLAX) 5 MG EC tablet Take 1 tablet (5 mg total)  by mouth daily as needed for moderate constipation. 03/08/18  Yes Vaughan Basta, MD  budesonide-formoterol [facility]) 160-4.5 MCG/ACT inhaler Inhale 2 puffs into the lungs 2 (two) times daily. 01/31/18  Yes McLean-Scocuzza, Nino Glow, MD  Calcium Carbonate-Vitamin D (CALCIUM 600+D) 600-400 MG-UNIT tablet Take 1 tablet by mouth daily.    Yes [provider]  carbamazepine (TEGRETOL) 200 MG tablet Take 1 tablet (200 mg total) by mouth 2 (two) times daily. 03/08/18  Yes Vaughan Basta, MD  docusate sodium (COLACE) 100 MG capsule Take 1 capsule (100 mg total) by mouth 2 (two) times daily. 03/08/18  Yes Vaughan Basta, MD  enoxaparin (LOVENOX) 100 MG/ML injection Inject 0.95 mLs (95 mg total) into the skin 2 (two) times daily. 04/01/18  Yes Fritzi Mandes, MD  fluticasone (FLONASE) 50 MCG/ACT nasal spray Place 1 spray into both nostrils daily.    Yes [provider]  furosemide (LASIX) 40 MG tablet Take 1 tablet (40 mg total) by mouth daily. 03/27/18  Yes McLean-Scocuzza, Nino Glow, MD  gabapentin (NEURONTIN) 100 MG capsule Take 100 mg by mouth 2 (two) times daily.  03/26/18  Yes [provider]  guaiFENesin (MUCINEX) 600 MG 12 hr tablet Take 600 mg by mouth daily.    Yes [provider]  HYDROcodone-acetaminophen (NORCO/VICODIN) 5-325 MG tablet Take 1 tablet by mouth every 6 (six) hours as needed for moderate pain or severe pain. 03/11/18  Yes Toni Arthurs, NP  insulin regular (NOVOLIN R,HUMULIN R) 100 units/mL injection Inject 0-12 Units into the skin 3 (three) times daily before meals. 100 unit/mL;  amt: Per Sliding Scale; If Blood Sugar is 176 to 250, give 3 Units.If Blood Sugar is 251 to 325, give 6 Units.If Blood Sugar is 326 to 450, give 10 Units. If Blood Sugar is greater than 450, give 12 Units and recheck in 1 hour. Notify MD/NP if >450 after recheck. Check blood sugar AC & HS   Yes [provider]  levothyroxine (SYNTHROID, LEVOTHROID) 200 MCG tablet Take 200 mcg by mouth daily before breakfast. Except on Sunday. Do not take with other medications   Yes [provider]  lidocaine (LIDODERM) 5 % Place 1 patch onto the skin daily. Remove & Discard patch within 12 hours or as directed by MD 04/01/18  Yes Fritzi Mandes, MD  midodrine (PROAMATINE) 10 MG tablet Take 1 tablet (10 mg total) by mouth 3 (three) times daily with meals. 03/08/18  Yes Vaughan Basta, MD  Multiple Vitamin (MULTIVITAMIN WITH MINERALS) TABS tablet Take 1 tablet by mouth daily.   Yes [provider]  Nutritional Supplements (ENSURE ENLIVE PO) Take 1 Bottle by mouth 2 (two) times daily.   Yes [provider]  pantoprazole (PROTONIX) 40 MG tablet Take 1 tablet (40 mg total) by mouth daily. 12/27/17  Yes McLean-Scocuzza, Nino Glow, MD  polyethylene glycol powder (GLYCOLAX/MIRALAX) powder Take 17 g by mouth daily as needed for mild constipation.  12/10/15  Yes [provider]  tiotropium (SPIRIVA) 18 MCG inhalation capsule Place 1 capsule (18 mcg total) into inhaler and inhale daily. 01/10/18  Yes McLean-Scocuzza, Nino Glow, MD  traZODone (DESYREL) 50 MG tablet Take 0.5 tablets (25 mg total) by mouth at bedtime as needed for sleep. 03/08/18  Yes Vaughan Basta, MD  warfarin (COUMADIN) 10 MG tablet Take 10 mg by mouth daily at 6 PM.   Yes [provider]  warfarin (COUMADIN) 7.5 MG tablet Take 1 tablet (7.5 mg total) by mouth daily at 6 PM.  Patient not taking: Reported on 04/04/2018 04/01/18   Fritzi Mandes, MD     Results for orders placed or performed during the hospital  encounter of 04/03/18 (from the past 48 hour(s))  Comprehensive metabolic panel     Status: Abnormal   Collection Time: 04/03/18  9:49 PM  Result Value Ref Range   Sodium 137 135 - 145 mmol/L   Potassium 3.9 3.5 - 5.1 mmol/L   Chloride 100 (L) 101 - 111 mmol/L   CO2 29 22 - 32 mmol/L   Glucose, Bld 133 (H) 65 - 99 mg/dL   BUN 18 6 - 20 mg/dL   Creatinine, Ser 1.05 (H) 0.44 - 1.00 mg/dL   Calcium 7.9 (L) 8.9 - 10.3 mg/dL   Total Protein 5.8 (L) 6.5 - 8.1 g/dL   Albumin 2.6 (L) 3.5 - 5.0 g/dL   AST 36 15 - 41 U/L   ALT 19 14 - 54 U/L   Alkaline Phosphatase 69 38 - 126 U/L   Total Bilirubin 0.4 0.3 - 1.2 mg/dL   GFR calc non Af Amer 46 (L) >60 mL/min   GFR calc Af Amer 53 (L) >60 mL/min    Comment: (NOTE) The eGFR has been calculated using the CKD EPI equation. This calculation has not been validated in all clinical situations. eGFR's persistently <60 mL/min signify possible Chronic Kidney Disease.    Anion gap 8 5 - 15    Comment: Performed at [facility], [address] [place], [place] [address]  Lactic acid, plasma     Status: None   Collection Time: 04/03/18  9:49 PM  Result Value Ref Range   Lactic Acid, Venous 1.5 0.5 - 1.9 mmol/L    Comment: Performed at [facility], [address] [place], [place] [address]  CBC with Differential     Status: Abnormal   Collection Time: 04/03/18  9:49 PM  Result Value Ref Range   WBC 9.3 3.6 - 11.0 K/uL   RBC 3.28 (L) 3.80 - 5.20 MIL/uL   Hemoglobin 10.5 (L) 12.0 - 16.0 g/dL   HCT 30.9 (L) 35.0 - 47.0 %   MCV 94.4 80.0 - 100.0 fL   MCH 31.9 26.0 - 34.0 pg   MCHC 33.9 32.0 - 36.0 g/dL   RDW 15.5 (H) 11.5 - 14.5 %   Platelets 337 150 - 440 K/uL   Neutrophils Relative % 75 %   Neutro Abs 7.0 (H) 1.4 - 6.5 K/uL   Lymphocytes Relative 14 %   Lymphs Abs 1.3 1.0 - 3.6 K/uL   Monocytes Relative 10 %   Monocytes Absolute 0.9 0.2 - 0.9 K/uL   Eosinophils Relative 1 %   Eosinophils Absolute 0.1 0 - 0.7 K/uL    Basophils Relative 0 %   Basophils Absolute 0.0 0 - 0.1 K/uL    Comment: Performed at [facility], [address] [place], [place] [address]  Brain natriuretic peptide     Status: None   Collection Time: 04/03/18  9:49 PM  Result Value Ref Range   B Natriuretic Peptide 83.0 0.0 - 100.0 pg/mL    Comment: Performed at [facility], [address] [place],  [address]  Troponin I     Status: None   Collection Time: 04/03/18  9:49 PM  Result Value Ref Range   Troponin I <0.03 <0.03 ng/mL    Comment: Performed at [facility], [address] [place], [place] [address]  Lactic acid, plasma  Status: None   Collection Time: 04/03/18 11:28 PM  Result Value Ref Range   Lactic Acid, Venous 0.9 0.5 - 1.9 mmol/L    Comment: Performed at [facility], [address] [place], [place] [address]  Protime-INR     Status: Abnormal   Collection Time: 04/03/18 11:28 PM  Result Value Ref Range   Prothrombin Time 18.9 (H) 11.4 - 15.2 seconds   INR 1.60     Comment: Performed at [facility], [address] [place], [place] [address]  Urinalysis, Complete w Microscopic     Status: Abnormal   Collection Time: 04/04/18  2:20 AM  Result Value Ref Range   Color, Urine YELLOW (A) YELLOW   APPearance TURBID (A) CLEAR   Specific Gravity, Urine >1.046 (H) 1.005 - 1.030   pH 6.0 5.0 - 8.0   Glucose, UA NEGATIVE NEGATIVE mg/dL   Hgb urine dipstick SMALL (A) NEGATIVE   Bilirubin Urine NEGATIVE NEGATIVE   Ketones, ur NEGATIVE NEGATIVE mg/dL   Protein, ur 30 (A) NEGATIVE mg/dL   Nitrite POSITIVE (A) NEGATIVE   Leukocytes, UA LARGE (A) NEGATIVE   RBC / HPF 11-20 0 - 5 RBC/hpf   WBC, UA >50 (H) 0 - 5 WBC/hpf   Bacteria, UA MANY (A) NONE SEEN   Squamous Epithelial / LPF 11-20 0 - 5    Comment: Please note change in reference range.   WBC Clumps PRESENT    Mucus PRESENT    Non Squamous Epithelial 0-5 (A) NONE SEEN    Comment: Performed at [facility], [address] [place], [place] [address]   Ct Angio Chest Pe W And/or Wo Contrast  Result Date: 04/04/2018 CLINICAL DATA:  Fever to 102.1. EXAM: CT ANGIOGRAPHY CHEST WITH CONTRAST TECHNIQUE: Multidetector CT imaging of the chest was performed using the standard protocol during bolus administration of intravenous contrast. Multiplanar CT image reconstructions and MIPs were obtained to evaluate the vascular anatomy. CONTRAST:  15m ISOVUE-370 IOPAMIDOL (ISOVUE-370) INJECTION 76% COMPARISON:  10/08/2015 FINDINGS: Cardiovascular: The heart is top-normal in size without pericardial effusion. No acute pulmonary embolus. No aortic aneurysm or dissection. There is mild aortic atherosclerosis. Conventional branch pattern of the great vessels off the arch. Mediastinum/Nodes: No axillary, mediastinal nor hilar lymphadenopathy. Patent trachea and mainstem bronchus. Status post thyroidectomy. 7 mm hyperdensity along the right thyroid cartilage may reflect stigmata of old remote trauma or post treatment change given thyroidectomy, series 4, image 7. This causes slight deviation of the right vocal cord medially. A small cartilaginous lesion is not entirely excluded. Lungs/Pleura: Inspissated mucus and bronchiectasis within the lower lobes bilaterally with more peripheral airspace opacities compatible with atelectasis and/or pneumonia. No pulmonary edema, effusion or pneumothorax. Upper Abdomen: No acute abnormality. Musculoskeletal: No aggressive osseous lesions. Review of the MIP images confirms the above findings. IMPRESSION: 1. Bilateral lower lobe bronchiectasis with areas of inspissated mucus. More peripheral pulmonary consolidations and atelectasis within both lower lobes as a result. Suspect small foci of pneumonia admixed with atelectasis accounting for this appearance. Consider short-term follow-up to assure improvement and/or resolution after treating for pneumonia in 4-6 weeks. 2. Status post  thyroidectomy. Right thyroid cartilaginous hyperdense 7 mm focus slightly deviating the right vocal cord medially. Differential possibilities may include a nonspecific small cartilaginous lesion of the thyroid cartilage, post treatment change given thyroidectomy or possibly posttraumatic with soft tissue mineralization. Consider short-term interval follow-up of the neck in 3 months. 3. No acute pulmonary embolus. Aortic Atherosclerosis (ICD10-I70.0). Electronically  Signed   By: Ashley Royalty M.D.   On: 04/04/2018 00:36   US Venous Img Upper Uni Right  Result Date: 04/04/2018 CLINICAL DATA:  Right arm swelling for 1 week. Large area of bruising and discoloration medial to the elbow and extending into the upper forearm. EXAM: Right UPPER EXTREMITY VENOUS DOPPLER ULTRASOUND TECHNIQUE: Gray-scale sonography with graded compression, as well as color Doppler and duplex ultrasound were performed to evaluate the upper extremity deep venous system from the level of the subclavian vein and including the jugular, axillary, basilic, radial, ulnar and upper cephalic vein. Spectral Doppler was utilized to evaluate flow at rest and with distal augmentation maneuvers. COMPARISON:  None. FINDINGS: Contralateral Subclavian Vein: Respiratory phasicity is normal and symmetric with the symptomatic side. No evidence of thrombus. Normal compressibility. Internal Jugular Vein: No evidence of thrombus. Normal compressibility, respiratory phasicity and response to augmentation. Subclavian Vein: No evidence of thrombus. Normal compressibility, respiratory phasicity and response to augmentation. Axillary Vein: No evidence of thrombus. Normal compressibility, respiratory phasicity and response to augmentation. Cephalic Vein: No evidence of thrombus. Normal compressibility, respiratory phasicity and response to augmentation. Basilic Vein: No evidence of thrombus. Normal compressibility, respiratory phasicity and response to augmentation.  Brachial Veins: No evidence of thrombus. Normal compressibility, respiratory phasicity and response to augmentation. Radial Veins: No evidence of thrombus. Normal compressibility, respiratory phasicity and response to augmentation. Ulnar Veins: No evidence of thrombus. Normal compressibility, respiratory phasicity and response to augmentation. Venous Reflux:  None visualized. Other Findings: In the right medial elbow region, extending to the upper forearm, there is a circumscribed mixed echotexture structure in the subcutaneous soft tissues measuring 8.7 x 2.8 x 3.8 cm. No flow is demonstrated within the lesion on color flow Doppler imaging. Appearance is most consistent with a soft tissue hematoma. IMPRESSION: No evidence of DVT within the right upper extremity. Soft tissue hematoma in the medial right elbow region extending to the upper forearm and measuring up to 8.7 cm maximal diameter. Electronically Signed   By: Lucienne Capers M.D.   On: 04/04/2018 02:20   Dg Chest Portable 1 View  Result Date: 04/03/2018 CLINICAL DATA:  Fever of 102.1. EXAM: PORTABLE CHEST 1 VIEW COMPARISON:  CXR 10/08/2015 and chest CT 10/08/2015 FINDINGS: Normal size cardiac silhouette. Aortic atherosclerosis with uncoiling. No acute osseous abnormality. Low lung volumes with chronic interstitial prominence and bibasilar atelectasis. No pulmonary consolidation, CHF, effusion or pneumothorax. IMPRESSION: Low lung volumes with bibasilar atelectasis. Aortic atherosclerosis. Electronically Signed   By: Ashley Royalty M.D.   On: 04/03/2018 22:08    Review of Systems  Constitutional: Negative for chills and fever.  HENT: Negative for sore throat and tinnitus.   Eyes: Negative for blurred vision and redness.  Respiratory: Positive for shortness of breath. Negative for cough.   Cardiovascular: Positive for leg swelling. Negative for chest pain, palpitations, orthopnea and PND.  Gastrointestinal: Negative for abdominal pain, diarrhea,  nausea and vomiting.  Genitourinary: Negative for dysuria, frequency and urgency.  Musculoskeletal: Negative for joint pain and myalgias.       Upper extremity pain and swelling  Skin: Negative for rash.       No lesions  Neurological: Negative for speech change, focal weakness and weakness.  Endo/Heme/Allergies: Does not bruise/bleed easily.       No temperature intolerance  Psychiatric/Behavioral: Negative for depression and suicidal ideas.    Blood pressure (!) 77/43, pulse 64, temperature 98.8 F (37.1 C), temperature source Oral, resp. rate 20, height _0  (  1.753 m), weight 90.7 kg (200 lb), SpO2 98 %. Physical Exam  Vitals reviewed. Constitutional: She is oriented to person, place, and time. She appears well-developed and well-nourished. No distress.  HENT:  Head: Normocephalic and atraumatic.  Mouth/Throat: Oropharynx is clear and moist.  Eyes: Pupils are equal, round, and reactive to light. Conjunctivae and EOM are normal. No scleral icterus.  Neck: Normal range of motion. Neck supple. No JVD present. No tracheal deviation present. No thyromegaly present.  Cardiovascular: Normal rate, regular rhythm and normal heart sounds. Exam reveals no gallop and no friction rub.  No murmur heard. Respiratory: Effort normal and breath sounds normal.  GI: Soft. Bowel sounds are normal. She exhibits no distension. There is no tenderness.  Genitourinary:  Genitourinary Comments: Deferred  Musculoskeletal: Normal range of motion. She exhibits no edema.  Lymphadenopathy:    She has no cervical adenopathy.  Neurological: She is alert and oriented to person, place, and time. No cranial nerve deficit. She exhibits normal muscle tone.  Skin: Skin is warm and dry. No rash noted. No erythema.  Psychiatric: She has a normal mood and affect. Her behavior is normal. Judgment and thought content normal.     Assessment/Plan This is an 84 year old female admitted for pneumonia. 1.  Healthcare  associated pneumonia: Continue aztreonam and vancomycin.  Supplemental oxygen as needed.  Obtain sputum sample if possible. 2.  Sepsis: Patient meets criteria via tachypnea and fever.  She is hemodynamically stable.  Her lactic acid is decreasing with intravenous hydration.  Follow blood cultures for growth and sensitivities.  Continue aztreonam and vancomycin.  The patient's blood pressure has been soft since admission however a dose of fentanyl for her upper extremity pain dropped her pressure more.  If hypotension does not respond to fluid resuscitation we may move the patient to the stepdown unit.  (Notably, the patient has a history of hypotension although her family reports that her blood pressure was normal prior to admission.  Continue Midodrine) 3.  CKD: Stage III; baseline for the last 6 months.  Avoid nephrotoxic agents. 4.  Diabetes mellitus type 2: Continue sliding scale insulin while hospitalized. 5.  Seizure disorder: Continue Tegretol.  The patient is very sensitive to timing of her antiepileptic medication 6.  DVT: Upper extremity; continue therapeutic Lovenox 7.  COPD: Continue corticosteroid and Spiriva; albuterol as needed. 8.  Hypothyroidism: Continue Synthroid; check TSH 9.  GI prophylaxis: Pantoprazole per home regimen The patient is a full code.  Time spent on admission orders and patient care approximately 45 minutes  Harrie Foreman, MD 04/04/2018, 3:00 AM

## 2018-04-04 NOTE — Telephone Encounter (Signed)
FYI. Dr. Aundra Dubin is aware.

## 2018-04-04 NOTE — Telephone Encounter (Signed)
Ok to give

## 2018-04-04 NOTE — Progress Notes (Signed)
ANTICOAGULATION CONSULT NOTE - Initial Consult  Pharmacy Consult for Lovenox dosing Indication: DVT  Allergies  Allergen Reactions  . Penicillins Shortness Of Breath, Rash and Other (See Comments)    Has patient had a PCN reaction causing immediate rash, facial/tongue/throat swelling, SOB or lightheadedness with hypotension: Yes Has patient had a PCN reaction causing severe rash involving mucus membranes or skin necrosis: No Has patient had a PCN reaction that required hospitalization No Has patient had a PCN reaction occurring within the last 10 years: No If all of the above answers are "NO", then may proceed with Cephalosporin use.  . Sulfa Antibiotics Shortness Of Breath, Rash and Other (See Comments)  . Amitiza [Lubiprostone]     N/v/d  . Aspirin Other (See Comments)    Reaction:  Unknown  Other reaction(s): Bleeding (intolerance) Per patient " causes nose to bleed" Can take 81 mg daily without any complications Other reaction(s): "bloody nose"   . Penicillin G Other (See Comments)    Has patient had a PCN reaction causing immediate rash, facial/tongue/throat swelling, SOB or lightheadedness with hypotension: No Has patient had a PCN reaction causing severe rash involving mucus membranes or skin necrosis: Unknown Has patient had a PCN reaction that required hospitalization: Unknown Has patient had a PCN reaction occurring within the last 10 years: Unknown If all of the above answers are "NO", then may proceed with Cephalosporin use.    Patient Measurements: Height: 5\' 9"  (175.3 cm) Weight: 200 lb (90.7 kg) IBW/kg (Calculated) : 66.2 Enoxaparin Dosing Weight: 91 kg  Vital Signs: Temp: 98.8 F (37.1 C) (05/01 2142) Temp Source: Oral (05/01 2142) BP: 98/46 (05/02 0325) Pulse Rate: 68 (05/02 0325)  Labs: Recent Labs    04/01/18 0452 04/03/18 2149 04/03/18 2328  HGB  --  10.5*  --   HCT  --  30.9*  --   PLT  --  337  --   LABPROT 14.3  --  18.9*  INR 1.12  --   1.60  CREATININE  --  1.05*  --   TROPONINI  --  <0.03  --     Estimated Creatinine Clearance: 43.6 mL/min (A) (by C-G formula based on SCr of 1.05 mg/dL (H)).   Medical History: Past Medical History:  Diagnosis Date  . Acoustic neuroma ([place])   . Allergy   . Asthma   . Bilateral swelling of feet    and legs  . Bladder infection   . CAD (coronary artery disease)   . Cataract   . Change in voice   . Compression fracture of body of thoracic vertebra (HCC)    T12 09/18/15 MRI s/p fall   . Constipation   . COPD (chronic obstructive pulmonary disease) ([place])    previous CXR with chronic interstitial lung dz   . CVA (cerebral vascular accident) ([place])   . Depression   . Diabetes ([place])    with neuropathy  . Diabetes mellitus, type 2 ([place])   . Diarrhea   . Double vision   . DVT (deep venous thrombosis) ([place])    right leg 10/2015 was on coumadin off as of 2017/2018 ; s/p IVC filter  . Enuresis   . Eye pain, right   . Fall   . Fatty liver    09/15/15 also mildly dilated pancreatitic duct rec MRCP small sub cm cyst hemangioma speeln mild right hydronephrorossi and prox. hydroureter, kidney stones, mild scarring kidneys  . Female stress incontinence   . Flank pain   .  GERD (gastroesophageal reflux disease)    with small hiatal hernia   . Hard of hearing   . Heart disease   . History of kidney problems   . Hyperlipidemia    mixed  . Hypertension   . Hypothyroidism, postsurgical   . Impaired mobility and ADLs    uses rolling walker has caretaker 24/7 at home  . Leg edema   . Mixed incontinence urge and stress (female)(female)   . Neuropathy   . Osteoarthritis    DDD spine   . Osteoporosis with fracture    T12 compression fracture  . Photophobia   . Pulmonary embolism ([place])    10/2015 off coumadin as of 04/2016  . Pulmonary HTN (HCC)    mild pulm HTN, echo 10/09/15 EF 16-10%RUEAV 1 dd, RV systolic pressure increased   . Recurrent UTI   . Sinus pressure   . Skin cancer     BCC jawline and scalp   . Thyroid disease    follows [facility]  . TIA (transient ischemic attack)    MRI 2009/2010 neg stroke   . Trigeminal neuralgia    Dr. Tomi Bamberger s/p gamma knife x 2, on Tegretol since 2011/2012 no increase in dose >200 mg bid rec per family per neurology   . Urinary frequency   . Urinary, incontinence, stress female    Dr Erlene Quan urology     Medications:  Warfarin PTA.  Assessment: INR 1.6  Goal of Therapy:  Monitor platelets by anticoagulation protocol: Yes   Plan:  Lovenox 1 mg/kg q 12 hours ordered. F/u labs per protocol.  Rissa Turley S 04/04/2018,4:24 AM

## 2018-04-04 NOTE — ED Notes (Signed)
Patient's O2 sat currently at 100% on 4L, patient's O2 reduced to 3L via nasal cannula.

## 2018-04-04 NOTE — ED Notes (Signed)
Transfer to floor delayed d/t patient's BP status after Fentanyl administration.

## 2018-04-04 NOTE — H&P (Signed)
Same day rounding progress note  This is an 84 year old female admitted for pneumonia.  1.  Healthcare associated pneumonia: Continue aztreonam and vancomycin for now - doubt bacterial infection with procalcitonin <0.1 but continue empiric Abx for now as she is sick .  Supplemental oxygen as needed.  Obtain sputum sample if possible. 2.  Sepsis: Patient meets criteria via tachypnea and fever.  She is hemodynamically stable.  Her lactic acid is decreasing with intravenous hydration.  Follow blood cultures for growth and sensitivities.  Continue aztreonam and vancomycin.  The patient's blood pressure has been soft since admission however a dose of fentanyl for her upper extremity pain dropped her pressure more.   - Continue Midodrine) 3.  CKD: Stage III; baseline for the last 6 months.  Avoid nephrotoxic agents. 4.  Diabetes mellitus type 2: Continue sliding scale insulin while hospitalized. 5.  Seizure disorder: Continue Tegretol.  The patient is very sensitive to timing of her antiepileptic medication    Time spent: 20 mins

## 2018-04-04 NOTE — Telephone Encounter (Signed)
Patient readmitted to hospital.

## 2018-04-04 NOTE — Consult Note (Addendum)
Name: Jessica Erickson MRN: [id number] DOB: 1925/12/16    ADMISSION DATE:  04/03/2018 CONSULTATION DATE: 04/04/2018  REFERRING MD : Dr. Marcille Blanco  CHIEF COMPLAINT: Fever  BRIEF PATIENT DESCRIPTION:  84 yo female admitted with possible pneumonia, urosepsis, and hypotension   SIGNIFICANT EVENTS  05/2-Pt admitted to stepdown unit   STUDIES:  CT Angio Chest 05/2>>Bilateral lower lobe bronchiectasis with areas of inspissated mucus. More peripheral pulmonary consolidations and atelectasis within both lower lobes as a result. Suspect small foci of pneumonia admixed with atelectasis accounting for this appearance. Consider short-term follow-up to assure improvement and/or resolution after treating for pneumonia in 4-6 weeks. Status post thyroidectomy. Right thyroid cartilaginous hyperdense 7 mm focus slightly deviating the right vocal cord medially. Differential possibilities may include a nonspecific small cartilaginous lesion of the thyroid cartilage, post treatment change given thyroidectomy or possibly posttraumatic with soft tissue mineralization. Consider short-term interval follow-up of the neck in 3 months. No acute pulmonary embolus.  HISTORY OF PRESENT ILLNESS:   This is an 84 yo female with a PMH as listed below who presented to [facility] ER via EMS from [place] on 05/1 with fever and right upper extremity edema. The pt was discharged from [facility] on 04/29 following diagnosis and treatment of bilateral DVT's (thrombectomy).  At discharge pt instructed to continue subq lovenox and coumadin until INR therapeutic with plans for IVC filter placement in outpatient setting.  During current ER visit CT Angio Chest negative for pulmonary embolism, however concerning for pneumonia.  CXR showed bibasilar atelectasis.  Lab results revealed creatinine 1.05, lactic acid 1.5, BNP 83 and pt hypotensive concerning for sepsis.  Therefore, sepsis protocol initiated and pt received iv fluids and abx.  She was  subsequently admitted to the stepdown unit by hospitalist team for further workup and treatment.   PAST MEDICAL HISTORY :   has a past medical history of Acoustic neuroma ([place]), Allergy, Asthma, Bilateral swelling of feet, Bladder infection, CAD (coronary artery disease), Cataract, Change in voice, Compression fracture of body of thoracic vertebra ([place]), Constipation, COPD (chronic obstructive pulmonary disease) ([place]), CVA (cerebral vascular accident) ([place]), Depression, Diabetes ([place]), Diabetes mellitus, type 2 ([place]), Diarrhea, Double vision, DVT (deep venous thrombosis) ([place]), Enuresis, Eye pain, right, Fall, Fatty liver, Female stress incontinence, Flank pain, GERD (gastroesophageal reflux disease), Hard of hearing, Heart disease, History of kidney problems, Hyperlipidemia, Hypertension, Hypothyroidism, postsurgical, Impaired mobility and ADLs, Leg edema, Mixed incontinence urge and stress (female)(female), Neuropathy, Osteoarthritis, Osteoporosis with fracture, Photophobia, Pulmonary embolism ([place]), Pulmonary HTN ([place]), Recurrent UTI, Sinus pressure, Skin cancer, Thyroid disease, TIA (transient ischemic attack), Trigeminal neuralgia, Urinary frequency, and Urinary, incontinence, stress female.  has a past surgical history that includes brain tumor surgery; Throat surgery; Cholecystectomy; Cardiac catheterization (N/A, 10/11/2015); Thyroid surgery; IVC FILTER PLACEMENT ([place] HX); Brain surgery; Tooth extraction; Eye surgery; Breast surgery; Pubovaginal sling; Mohs surgery; Appendectomy; Laparoscopic tubal ligation; Total thyroidectomy (1976); Cataract extraction; and PERIPHERAL VASCULAR THROMBECTOMY (Bilateral, 03/29/2018). Prior to Admission medications   Medication Sig Start Date End Date Taking? Authorizing Provider  acetaminophen (TYLENOL) 325 MG tablet Take 650 mg by mouth every 6 (six) hours as needed.   Yes [provider]  Amino Acids-Protein Hydrolys (FEEDING SUPPLEMENT, PRO-STAT SUGAR FREE  64,) LIQD Take 30 mLs by mouth 2 (two) times daily between meals.   Yes [provider]  aspirin EC 81 MG tablet Take 81 mg by mouth daily.   Yes [provider]  atorvastatin (LIPITOR) 40 MG tablet Take 40 mg by  mouth daily at 6 PM.    Yes [provider]  bisacodyl (DULCOLAX) 5 MG EC tablet Take 1 tablet (5 mg total) by mouth daily as needed for moderate constipation. 03/08/18  Yes Vaughan Basta, MD  budesonide-formoterol [facility]) 160-4.5 MCG/ACT inhaler Inhale 2 puffs into the lungs 2 (two) times daily. 01/31/18  Yes McLean-Scocuzza, Nino Glow, MD  Calcium Carbonate-Vitamin D (CALCIUM 600+D) 600-400 MG-UNIT tablet Take 1 tablet by mouth daily.    Yes [provider]  carbamazepine (TEGRETOL) 200 MG tablet Take 1 tablet (200 mg total) by mouth 2 (two) times daily. 03/08/18  Yes Vaughan Basta, MD  docusate sodium (COLACE) 100 MG capsule Take 1 capsule (100 mg total) by mouth 2 (two) times daily. 03/08/18  Yes Vaughan Basta, MD  enoxaparin (LOVENOX) 100 MG/ML injection Inject 0.95 mLs (95 mg total) into the skin 2 (two) times daily. 04/01/18  Yes Fritzi Mandes, MD  fluticasone (FLONASE) 50 MCG/ACT nasal spray Place 1 spray into both nostrils daily.    Yes [provider]  furosemide (LASIX) 40 MG tablet Take 1 tablet (40 mg total) by mouth daily. 03/27/18  Yes McLean-Scocuzza, Nino Glow, MD  gabapentin (NEURONTIN) 100 MG capsule Take 100 mg by mouth 2 (two) times daily.  03/26/18  Yes [provider]  guaiFENesin (MUCINEX) 600 MG 12 hr tablet Take 600 mg by mouth daily.    Yes [provider]  HYDROcodone-acetaminophen (NORCO/VICODIN) 5-325 MG tablet Take 1 tablet by mouth every 6 (six) hours as needed for moderate pain or severe pain. 03/11/18  Yes Toni Arthurs, NP  insulin regular (NOVOLIN R,HUMULIN R) 100 units/mL injection Inject 0-12 Units into the skin 3 (three) times daily before meals. 100 unit/mL; amt: Per Sliding  Scale; If Blood Sugar is 176 to 250, give 3 Units.If Blood Sugar is 251 to 325, give 6 Units.If Blood Sugar is 326 to 450, give 10 Units. If Blood Sugar is greater than 450, give 12 Units and recheck in 1 hour. Notify MD/NP if >450 after recheck. Check blood sugar AC & HS   Yes [provider]  levothyroxine (SYNTHROID, LEVOTHROID) 200 MCG tablet Take 200 mcg by mouth daily before breakfast. Except on Sunday. Do not take with other medications   Yes [provider]  lidocaine (LIDODERM) 5 % Place 1 patch onto the skin daily. Remove & Discard patch within 12 hours or as directed by MD 04/01/18  Yes Fritzi Mandes, MD  midodrine (PROAMATINE) 10 MG tablet Take 1 tablet (10 mg total) by mouth 3 (three) times daily with meals. 03/08/18  Yes Vaughan Basta, MD  Multiple Vitamin (MULTIVITAMIN WITH MINERALS) TABS tablet Take 1 tablet by mouth daily.   Yes [provider]  Nutritional Supplements (ENSURE ENLIVE PO) Take 1 Bottle by mouth 2 (two) times daily.   Yes [provider]  pantoprazole (PROTONIX) 40 MG tablet Take 1 tablet (40 mg total) by mouth daily. 12/27/17  Yes McLean-Scocuzza, Nino Glow, MD  polyethylene glycol powder (GLYCOLAX/MIRALAX) powder Take 17 g by mouth daily as needed for mild constipation.  12/10/15  Yes [provider]  tiotropium (SPIRIVA) 18 MCG inhalation capsule Place 1 capsule (18 mcg total) into inhaler and inhale daily. 01/10/18  Yes McLean-Scocuzza, Nino Glow, MD  traZODone (DESYREL) 50 MG tablet Take 0.5 tablets (25 mg total) by mouth at bedtime as needed for sleep. 03/08/18  Yes Vaughan Basta, MD  warfarin (COUMADIN) 10 MG tablet Take 10 mg by mouth daily at 6  PM.   Yes [provider]  warfarin (COUMADIN) 7.5 MG tablet Take 1 tablet (7.5 mg total) by mouth daily at 6 PM. Patient not taking: Reported on 04/04/2018 04/01/18   Fritzi Mandes, MD   Allergies  Allergen Reactions  . Penicillins Shortness Of Breath, Rash and Other  (See Comments)    Has patient had a PCN reaction causing immediate rash, facial/tongue/throat swelling, SOB or lightheadedness with hypotension: Yes Has patient had a PCN reaction causing severe rash involving mucus membranes or skin necrosis: No Has patient had a PCN reaction that required hospitalization No Has patient had a PCN reaction occurring within the last 10 years: No If all of the above answers are "NO", then may proceed with Cephalosporin use.  . Sulfa Antibiotics Shortness Of Breath, Rash and Other (See Comments)  . Amitiza [Lubiprostone]     N/v/d  . Aspirin Other (See Comments)    Reaction:  Unknown  Other reaction(s): Bleeding (intolerance) Per patient " causes nose to bleed" Can take 81 mg daily without any complications Other reaction(s): "bloody nose"   . Penicillin G Other (See Comments)    Has patient had a PCN reaction causing immediate rash, facial/tongue/throat swelling, SOB or lightheadedness with hypotension: No Has patient had a PCN reaction causing severe rash involving mucus membranes or skin necrosis: Unknown Has patient had a PCN reaction that required hospitalization: Unknown Has patient had a PCN reaction occurring within the last 10 years: Unknown If all of the above answers are "NO", then may proceed with Cephalosporin use.    FAMILY HISTORY:  family history includes Cancer in her daughter; Diabetes in her father; Heart disease in her mother. SOCIAL HISTORY:  reports that she quit smoking about 22 years ago. Her smoking use included cigarettes. She has never used smokeless tobacco. She reports that she does not drink alcohol or use drugs.  REVIEW OF SYSTEMS: Positives in BOLD  Constitutional: fever, chills, weight loss, malaise/fatigue and diaphoresis.  HENT: Negative for hearing loss, ear pain, nosebleeds, congestion, sore throat, neck pain, tinnitus and ear discharge.   Eyes: Negative for blurred vision, double vision, photophobia, pain, discharge  and redness.  Respiratory: Negative for cough, hemoptysis, sputum production, shortness of breath, wheezing and stridor.   Cardiovascular: Negative for chest pain, palpitations, orthopnea, claudication, leg swelling and PND.  Gastrointestinal: Negative for heartburn, nausea, vomiting, abdominal pain, diarrhea, constipation, blood in stool and melena.  Genitourinary: Negative for dysuria, urgency, frequency, hematuria and flank pain.  Musculoskeletal: right upper extremity pain, myalgias, back pain, joint pain and falls.  Skin: Negative for itching and rash.  Neurological: Negative for dizziness, tingling, tremors, sensory change, speech change, focal weakness, seizures, loss of consciousness, weakness and headaches.  Endo/Heme/Allergies: Negative for environmental allergies and polydipsia. Does not bruise/bleed easily.  SUBJECTIVE:  No complaints at this time   VITAL SIGNS: Temp:  [97.7 F (36.5 C)-98.8 F (37.1 C)] 97.7 F (36.5 C) (05/02 0432) Pulse Rate:  [64-74] 68 (05/02 0325) Resp:  [14-24] 17 (05/02 0325) BP: (71-104)/(43-55) 98/46 (05/02 0325) SpO2:  [89 %-100 %] 97 % (05/02 0325) Weight:  [90.7 kg (200 lb)-93.5 kg (206 lb 2.1 oz)] 93.5 kg (206 lb 2.1 oz) (05/02 0432)  PHYSICAL EXAMINATION: General: elderly female resting in bed, NAD  Neuro: follows commands, lethargic, PERRL  HEENT: supple, no JVD Cardiovascular: sinus brady, no R/G Lungs: rhonchi and diminished throughout, even, non labored Abdomen: +BS x4, soft, obese, non tender, non distended  Musculoskeletal: 2+ pitting bilateral lower  extremity edema, 1+ RUE edema  Skin: scattered ecchymosis, no rashes or lesions   Recent Labs  Lab 03/30/18 0745 04/03/18 2149  NA 139 137  K 3.9 3.9  CL 104 100*  CO2 29 29  BUN 16 18  CREATININE 1.28* 1.05*  GLUCOSE 106* 133*   Recent Labs  Lab 03/30/18 0745 03/31/18 0458 04/03/18 2149  HGB 10.3* 9.9* 10.5*  HCT 30.7* 29.3* 30.9*  WBC 6.6 7.0 9.3  PLT 188 205 337    Ct Angio Chest Pe W And/or Wo Contrast  Result Date: 04/04/2018 CLINICAL DATA:  Fever to 102.1. EXAM: CT ANGIOGRAPHY CHEST WITH CONTRAST TECHNIQUE: Multidetector CT imaging of the chest was performed using the standard protocol during bolus administration of intravenous contrast. Multiplanar CT image reconstructions and MIPs were obtained to evaluate the vascular anatomy. CONTRAST:  67mL ISOVUE-370 IOPAMIDOL (ISOVUE-370) INJECTION 76% COMPARISON:  10/08/2015 FINDINGS: Cardiovascular: The heart is top-normal in size without pericardial effusion. No acute pulmonary embolus. No aortic aneurysm or dissection. There is mild aortic atherosclerosis. Conventional branch pattern of the great vessels off the arch. Mediastinum/Nodes: No axillary, mediastinal nor hilar lymphadenopathy. Patent trachea and mainstem bronchus. Status post thyroidectomy. 7 mm hyperdensity along the right thyroid cartilage may reflect stigmata of old remote trauma or post treatment change given thyroidectomy, series 4, image 7. This causes slight deviation of the right vocal cord medially. A small cartilaginous lesion is not entirely excluded. Lungs/Pleura: Inspissated mucus and bronchiectasis within the lower lobes bilaterally with more peripheral airspace opacities compatible with atelectasis and/or pneumonia. No pulmonary edema, effusion or pneumothorax. Upper Abdomen: No acute abnormality. Musculoskeletal: No aggressive osseous lesions. Review of the MIP images confirms the above findings. IMPRESSION: 1. Bilateral lower lobe bronchiectasis with areas of inspissated mucus. More peripheral pulmonary consolidations and atelectasis within both lower lobes as a result. Suspect small foci of pneumonia admixed with atelectasis accounting for this appearance. Consider short-term follow-up to assure improvement and/or resolution after treating for pneumonia in 4-6 weeks. 2. Status post thyroidectomy. Right thyroid cartilaginous hyperdense 7 mm focus  slightly deviating the right vocal cord medially. Differential possibilities may include a nonspecific small cartilaginous lesion of the thyroid cartilage, post treatment change given thyroidectomy or possibly posttraumatic with soft tissue mineralization. Consider short-term interval follow-up of the neck in 3 months. 3. No acute pulmonary embolus. Aortic Atherosclerosis (ICD10-I70.0). Electronically Signed   By: Ashley Royalty M.D.   On: 04/04/2018 00:36   US Venous Img Upper Uni Right  Result Date: 04/04/2018 CLINICAL DATA:  Right arm swelling for 1 week. Large area of bruising and discoloration medial to the elbow and extending into the upper forearm. EXAM: Right UPPER EXTREMITY VENOUS DOPPLER ULTRASOUND TECHNIQUE: Gray-scale sonography with graded compression, as well as color Doppler and duplex ultrasound were performed to evaluate the upper extremity deep venous system from the level of the subclavian vein and including the jugular, axillary, basilic, radial, ulnar and upper cephalic vein. Spectral Doppler was utilized to evaluate flow at rest and with distal augmentation maneuvers. COMPARISON:  None. FINDINGS: Contralateral Subclavian Vein: Respiratory phasicity is normal and symmetric with the symptomatic side. No evidence of thrombus. Normal compressibility. Internal Jugular Vein: No evidence of thrombus. Normal compressibility, respiratory phasicity and response to augmentation. Subclavian Vein: No evidence of thrombus. Normal compressibility, respiratory phasicity and response to augmentation. Axillary Vein: No evidence of thrombus. Normal compressibility, respiratory phasicity and response to augmentation. Cephalic Vein: No evidence of thrombus. Normal compressibility, respiratory phasicity and response to augmentation. Basilic  Vein: No evidence of thrombus. Normal compressibility, respiratory phasicity and response to augmentation. Brachial Veins: No evidence of thrombus. Normal compressibility,  respiratory phasicity and response to augmentation. Radial Veins: No evidence of thrombus. Normal compressibility, respiratory phasicity and response to augmentation. Ulnar Veins: No evidence of thrombus. Normal compressibility, respiratory phasicity and response to augmentation. Venous Reflux:  None visualized. Other Findings: In the right medial elbow region, extending to the upper forearm, there is a circumscribed mixed echotexture structure in the subcutaneous soft tissues measuring 8.7 x 2.8 x 3.8 cm. No flow is demonstrated within the lesion on color flow Doppler imaging. Appearance is most consistent with a soft tissue hematoma. IMPRESSION: No evidence of DVT within the right upper extremity. Soft tissue hematoma in the medial right elbow region extending to the upper forearm and measuring up to 8.7 cm maximal diameter. Electronically Signed   By: Lucienne Capers M.D.   On: 04/04/2018 02:20   Dg Chest Portable 1 View  Result Date: 04/03/2018 CLINICAL DATA:  Fever of 102.1. EXAM: PORTABLE CHEST 1 VIEW COMPARISON:  CXR 10/08/2015 and chest CT 10/08/2015 FINDINGS: Normal size cardiac silhouette. Aortic atherosclerosis with uncoiling. No acute osseous abnormality. Low lung volumes with chronic interstitial prominence and bibasilar atelectasis. No pulmonary consolidation, CHF, effusion or pneumothorax. IMPRESSION: Low lung volumes with bibasilar atelectasis. Aortic atherosclerosis. Electronically Signed   By: Ashley Royalty M.D.   On: 04/03/2018 22:08    ASSESSMENT / PLAN: Sepsis with hypotension secondary to UTI and possible pneumonia  Bilateral Lower Extremity DVT (US Venous revealed on 03/27/18 during previous admission) CKD-Stage III Anemia without obvious acute blood loss  Acute Pain  Hx: Diabetes Mellitus, CVA, COPD, HTN, and Hyperlipidemia P: Supplemental O2 for dyspnea and/or hypoxia  Continuous telemetry monitoring  Maintain map >60  NS @100  ml/hr  Continue outpatient cardiac  medications Subq lovenox dosing per pharmacy Trend CBC Monitor for s/sx of bleeding and transfuse for hgb <7 Trend BMP Replace electrolytes as indicated  Monitor UOP Trend WBC and monitor fever curve  Trend PCT   Follow cultures Continue abx  SSI Prn norco for pain management   Marda Stalker, [place] Pager [phone] (please enter 7 digits) PCCM Consult Pager [phone] (please enter 7 digits)     04/04/2018  16:00  Family was updated at the bedside with the patient progress. Code status was addressed with the son (POA and guardian) and requested DNR.

## 2018-04-04 NOTE — Progress Notes (Signed)
Pharmacy Antibiotic Note  Jessica Erickson is a 84 y.o. female admitted on 04/03/2018 with pneumonia.  Pharmacy has been consulted for vancomycin and aztreonam dosing.  Plan: After discussion with CCM, will Erickson/c vancomycin (negative MRSA PCR) and aztreonam and begin cefepime 2 g iv q 12 hours.   Height: 5\' 7"  (170.2 cm) Weight: 206 lb 2.1 oz (93.5 kg) IBW/kg (Calculated) : 61.6  Temp (24hrs), Avg:98.1 F (36.7 C), Min:97.7 F (36.5 C), Max:98.8 F (37.1 C)  Recent Labs  Lab 03/29/18 0118 03/30/18 0745 03/31/18 0458 04/03/18 2149 04/03/18 2328  WBC 6.4 6.6 7.0 9.3  --   CREATININE  --  1.28*  --  1.05*  --   LATICACIDVEN  --   --   --  1.5 0.9    Estimated Creatinine Clearance: 42.7 mL/min (A) (by C-G formula based on SCr of 1.05 mg/dL (H)).    Allergies  Allergen Reactions  . Penicillins Shortness Of Breath, Rash and Other (See Comments)    Has patient had a PCN reaction causing immediate rash, facial/tongue/throat swelling, SOB or lightheadedness with hypotension: Yes Has patient had a PCN reaction causing severe rash involving mucus membranes or skin necrosis: No Has patient had a PCN reaction that required hospitalization No Has patient had a PCN reaction occurring within the last 10 years: No If all of the above answers are "NO", then may proceed with Cephalosporin use.  . Sulfa Antibiotics Shortness Of Breath, Rash and Other (See Comments)  . Amitiza [Lubiprostone]     N/v/Erickson  . Aspirin Other (See Comments)    Reaction:  Unknown  Other reaction(s): Bleeding (intolerance) Per patient " causes nose to bleed" Can take 81 mg daily without any complications Other reaction(s): "bloody nose"   . Penicillin G Other (See Comments)    Has patient had a PCN reaction causing immediate rash, facial/tongue/throat swelling, SOB or lightheadedness with hypotension: No Has patient had a PCN reaction causing severe rash involving mucus membranes or skin necrosis: Unknown Has patient  had a PCN reaction that required hospitalization: Unknown Has patient had a PCN reaction occurring within the last 10 years: Unknown If all of the above answers are "NO", then may proceed with Cephalosporin use.    Antimicrobials this admission: Vancomycin, aztreonam 5/2   Cefepime 5/2 >>  Dose adjustments this admission:   Microbiology results: 5/1 BCx: NGTD 5/2 MRSA PCR: negative   Thank you for allowing pharmacy to be a part of this patient's care.  Jessica Erickson 04/04/2018 1:59 PM

## 2018-04-04 NOTE — Telephone Encounter (Signed)
Copied from [place] [id number]. Topic: Quick Communication - See Telephone Encounter >> Apr 04, 2018  9:00 AM Ether Griffins B wrote: CRM for notification. See Telephone encounter for: 04/04/18.  Pt's daughter calling to cancel pt's appt for today due to pt having to go back to the ER last night. She wanted to make Dr. Aundra Dubin aware.

## 2018-04-04 NOTE — Telephone Encounter (Signed)
Called and left voicemail for Shireen that per Dr. Aundra Dubin it is ok to postpone OT and PT until next week

## 2018-04-04 NOTE — Care Management (Signed)
Wells Guiles daughter reached out to this [facility] through ICU RN. Patient lives at [facility] followed by [facility]; she's been there for 2 days.  [facility] is actually who called EMS- fever/nonverbal. She had recent vascular surgery on her legs and has declined since then per Wells Guiles. They feel patient discharged from hospital too soon. Back story per Rebecca-3 weeks ago she had hired caregivers in the home however she could walk- under private pay.   She then got sick- came to [facility]- went to SNF at [facility] (April ~20 days)- then [facility] and now back to hospital (4th visit).   She said today, MD told them that patient would do better at home. Wells Guiles wants her at home but she knows she doesn't have the man power to manage her. RNCM and Lurline Hare with [facility] will meet with family to discuss concern and develop a plan of care with family/patient. List of private duty caregiver agencies will also be provided.

## 2018-04-04 NOTE — ED Notes (Signed)
Report given to Butch, RN. 

## 2018-04-04 NOTE — ED Notes (Addendum)
Called lab to add on Troponin and BNP. Patient currently still in radiology.

## 2018-04-04 NOTE — ED Provider Notes (Signed)
The patient CT is negative for clot but is concerning for pneumonia.  Given her hypoxia and recent hospitalization she requires inpatient admission for oxygen supplementation as well as IV antibiotics for hospital associated pneumonia.  I discussed with the hospitalist who has graciously agreed to admit the patient to his service.   Darel Hong, MD 04/04/18 [phone]

## 2018-04-04 NOTE — ED Notes (Signed)
Patient remains in radiology at this time. Will administer medications upon return.

## 2018-04-04 NOTE — Progress Notes (Signed)
Family Meeting Note  Advance Directive:yes  Today a meeting took place with the Patient and son at bedside.  Patient is unable to participate due SL:HTDSKA capacity Sedated   The following clinical team members were present during this meeting:MD  The following were discussed:Patient's diagnosis: 34 y f with multiple medical issues as below, recent thrombectomy on last admission, remote CVA, hypertension, diabetes and COPD being readmitted (4th time in last 6 months) for worsening SOB  . Acoustic neuroma ([place])   . Allergy   . Asthma   . Bilateral swelling of feet    and legs  . Bladder infection   . CAD (coronary artery disease)   . Cataract   . Change in voice   . Compression fracture of body of thoracic vertebra (HCC)    T12 09/18/15 MRI s/p fall   . Constipation   . COPD (chronic obstructive pulmonary disease) ([place])    previous CXR with chronic interstitial lung dz   . CVA (cerebral vascular accident) ([place])   . Depression   . Diabetes ([place])    with neuropathy  . Diabetes mellitus, type 2 ([place])   . Diarrhea   . Double vision   . DVT (deep venous thrombosis) ([place])    right leg 10/2015 was on coumadin off as of 2017/2018 ; s/p IVC filter  . Enuresis   . Eye pain, right   . Fall   . Fatty liver    09/15/15 also mildly dilated pancreatitic duct rec MRCP small sub cm cyst hemangioma speeln mild right hydronephrorossi and prox. hydroureter, kidney stones, mild scarring kidneys  . Female stress incontinence   . Flank pain   . GERD (gastroesophageal reflux disease)    with small hiatal hernia   . Hard of hearing   . Heart disease   . History of kidney problems   . Hyperlipidemia    mixed  . Hypertension   . Hypothyroidism, postsurgical   . Impaired mobility and ADLs    uses rolling walker has caretaker 24/7 at home  . Leg edema   . Mixed incontinence urge and stress (female)(female)   . Neuropathy   . Osteoarthritis    DDD  spine   . Osteoporosis with fracture    T12 compression fracture  . Photophobia   . Pulmonary embolism ([place])    10/2015 off coumadin as of 04/2016  . Pulmonary HTN (HCC)    mild pulm HTN, echo 10/09/15 EF 76-81%LXBWI 1 dd, RV systolic pressure increased   . Recurrent UTI   . Sinus pressure   . Skin cancer    BCC jawline and scalp   . Thyroid disease    follows [facility]  . TIA (transient ischemic attack)    MRI 2009/2010 neg stroke   . Trigeminal neuralgia    Dr. Tomi Bamberger s/p gamma knife x 2, on Tegretol since 2011/2012 no increase in dose >200 mg bid rec per family per neurology   . Urinary frequency   . Urinary, incontinence, stress female    Dr Erlene Quan urology     Patient's progosis: < 12 months and Goals for treatment: DNR  Additional follow-up to be provided: I recommend HHPT, RN and Palliative care although family prefers to take her back to facility for now and transition to home with private caregivers soon  Time spent during discussion:20 minutes  Max Sane, MD

## 2018-04-04 NOTE — Telephone Encounter (Signed)
Please advise 

## 2018-04-05 LAB — GLUCOSE, CAPILLARY
Glucose-Capillary: 122 mg/dL — ABNORMAL HIGH (ref 65–99)
Glucose-Capillary: 117 mg/dL — ABNORMAL HIGH (ref 65–99)
Glucose-Capillary: 109 mg/dL — ABNORMAL HIGH (ref 65–99)
Glucose-Capillary: 135 mg/dL — ABNORMAL HIGH (ref 65–99)

## 2018-04-05 LAB — PROCALCITONIN: Procalcitonin: <0.1 ng/mL

## 2018-04-05 MED ORDER — WARFARIN SODIUM 7.5 MG PO TABS
7.5000 mg | ORAL_TABLET | Freq: Every day | ORAL | Status: DC
  Filled 2018-04-05: qty 1

## 2018-04-05 MED ORDER — SODIUM CHLORIDE 0.9 % IV SOLN
1.0000 g | Freq: Two times a day (BID) | INTRAVENOUS | Status: DC
  Administered 2018-04-05 – 2018-04-06 (×4): 1 g via INTRAVENOUS
  Filled 2018-04-05 (×6): qty 1

## 2018-04-05 MED ORDER — WARFARIN - PHARMACIST DOSING INPATIENT
Freq: Every day | Status: DC
  Administered 2018-04-07: 17:00:00

## 2018-04-05 MED ORDER — LEVOTHYROXINE SODIUM 100 MCG PO TABS
200.0000 ug | ORAL_TABLET | Freq: Every day | ORAL | Status: DC
  Administered 2018-04-05 – 2018-04-08 (×4): 200 ug via ORAL
  Filled 2018-04-05 (×4): qty 2

## 2018-04-05 MED ORDER — SODIUM CHLORIDE 0.9 % IV BOLUS
1000.0000 mL | Freq: Once | INTRAVENOUS | Status: AC
  Administered 2018-04-05: 1000 mL via INTRAVENOUS

## 2018-04-05 MED ORDER — WARFARIN SODIUM 10 MG PO TABS
10.0000 mg | ORAL_TABLET | Freq: Every day | ORAL | Status: DC
  Administered 2018-04-05 – 2018-04-06 (×2): 10 mg via ORAL
  Filled 2018-04-05 (×2): qty 1

## 2018-04-05 NOTE — Progress Notes (Signed)
Pt given prune juice instead of miralax to promote a bowel movement, per patient request.

## 2018-04-05 NOTE — Progress Notes (Signed)
Report given to Terri, RN for pt to be transferred to room 207, family present in room with personal belongings.

## 2018-04-05 NOTE — NC FL2 (Addendum)
[place] LEVEL OF CARE SCREENING TOOL     IDENTIFICATION  Patient Name: Jessica Erickson Birthdate: July 05, 1926 Sex: female Admission Date (Current Location): 04/03/2018  [facility] and [place] Number:  Engineering geologist and Address:  [facility], [address] [place], [place] [address]      Provider Number: [id number]  Attending Physician Name and Address:  Nicholes Mango, MD  Relative Name and Phone Number:       Current Level of Care: Hospital Recommended Level of Care: [place] Prior Approval Number:    Date Approved/Denied:   PASRR Number:    Discharge Plan: (Assisted Living)    Current Diagnoses: Patient Active Problem List   Diagnosis Date Noted  . HCAP (healthcare-associated pneumonia) 04/04/2018  . DVT (deep vein thrombosis) in pregnancy ([place]) 03/27/2018  . Moderate protein-calorie malnutrition ([place]) 03/22/2018  . Orthostasis 03/05/2018  . Dizziness 03/04/2018  . COPD (chronic obstructive pulmonary disease) ([place]) 12/13/2017  . Anxiety and depression 12/13/2017  . Trigeminal neuralgia 12/13/2017  . GERD (gastroesophageal reflux disease) 12/13/2017  . Diabetes mellitus type 2, controlled ([place]) 12/13/2017  . Constipation 12/13/2017  . Insomnia 12/13/2017  . Hypothyroidism 12/13/2017  . UTI (urinary tract infection) 10/08/2015  . Collapsed vertebra, not elsewhere classified, thoracic region, initial encounter for fracture ([place]) 09/20/2015  . Basal cell carcinoma of scalp 04/06/2014  . Fothergill's neuralgia 08/05/2013  . Bladder infection, chronic 02/11/2013  . Female genuine stress incontinence 02/11/2013  . Incomplete bladder emptying 02/11/2013  . Intrinsic sphincter deficiency 02/11/2013  . Mixed incontinence 02/11/2013  . Excessive urination at night 02/11/2013  . Bladder retention 02/11/2013  . FOM (frequency of micturition) 02/11/2013  . Basal cell carcinoma of face 09/13/2011    Orientation  RESPIRATION BLADDER Height & Weight     Self, Place  Normal Continent Weight: 222 lb 10.6 oz (101 kg) Height:  5\' 7"  (170.2 cm)  BEHAVIORAL SYMPTOMS/MOOD NEUROLOGICAL BOWEL NUTRITION STATUS  (none) (none) Continent Diet(cardiac)  AMBULATORY STATUS COMMUNICATION OF NEEDS Skin   Supervision Verbally Normal                       Personal Care Assistance Level of Assistance  Dressing, Feeding, Bathing Bathing Assistance: Limited assistance Feeding assistance: Independent Dressing Assistance: Limited assistance     Functional Limitations Info  Hearing   Hearing Info: Impaired      SPECIAL CARE FACTORS FREQUENCY  PT (By licensed PT)  RN for lab draws [facility] for PT/INR checks daily and lovenox injections  Nebulizer treatments                    Contractures Contractures Info: Not present    Additional Factors Info  Code Status Code Status Info: dnr Allergies Info: pcn's;amitiza; sulfa abx; asa           Medication List    STOP taking these medications   aspirin EC 81 MG tablet   midodrine 10 MG tablet Commonly known as:  PROAMATINE     TAKE these medications   acetaminophen 325 MG tablet Commonly known as:  TYLENOL Take 650 mg by mouth every 6 (six) hours as needed.   albuterol (2.5 MG/3ML) 0.083% nebulizer solution Commonly known as:  PROVENTIL Take 3 mLs (2.5 mg total) by nebulization 3 (three) times daily.   atorvastatin 40 MG tablet Commonly known as:  LIPITOR Take 40 mg by mouth daily at 6 PM.   bisacodyl 5  MG EC tablet Commonly known as:  DULCOLAX Take 1 tablet (5 mg total) by mouth daily as needed for moderate constipation.   budesonide-formoterol 160-4.5 MCG/ACT inhaler Commonly known as:  SYMBICORT Inhale 2 puffs into the lungs 2 (two) times daily.   CALCIUM 600+D 600-400 MG-UNIT tablet Generic drug:  Calcium Carbonate-Vitamin D Take 1 tablet by mouth daily.   carbamazepine 200 MG tablet Commonly known as:   TEGRETOL Take 1 tablet (200 mg total) by mouth 2 (two) times daily.   docusate sodium 100 MG capsule Commonly known as:  COLACE Take 1 capsule (100 mg total) by mouth 2 (two) times daily.   doxycycline 100 MG tablet Commonly known as:  VIBRA-TABS Take 1 tablet (100 mg total) by mouth every 12 (twelve) hours.   enoxaparin 100 MG/ML injection Commonly known as:  LOVENOX Inject 1 mL (100 mg total) into the skin every 12 (twelve) hours. What changed:    how much to take  when to take this   ENSURE ENLIVE PO Take 1 Bottle by mouth 2 (two) times daily.   feeding supplement (PRO-STAT SUGAR FREE 64) Liqd Take 30 mLs by mouth 2 (two) times daily between meals.   fluticasone 50 MCG/ACT nasal spray Commonly known as:  FLONASE Place 1 spray into both nostrils daily.   furosemide 40 MG tablet Commonly known as:  LASIX Take 1 tablet (40 mg total) by mouth daily.   gabapentin 100 MG capsule Commonly known as:  NEURONTIN Take 100 mg by mouth 2 (two) times daily.   guaiFENesin 600 MG 12 hr tablet Commonly known as:  MUCINEX Take 600 mg by mouth daily.   HYDROcodone-acetaminophen 5-325 MG tablet Commonly known as:  NORCO/VICODIN Take 1 tablet by mouth every 6 (six) hours as needed for moderate pain or severe pain.   insulin regular 100 units/mL injection Commonly known as:  NOVOLIN R,HUMULIN R Inject 0-12 Units into the skin 3 (three) times daily before meals. 100 unit/mL; amt: Per Sliding Scale; If Blood Sugar is 176 to 250, give 3 Units.If Blood Sugar is 251 to 325, give 6 Units.If Blood Sugar is 326 to 450, give 10 Units. If Blood Sugar is greater than 450, give 12 Units and recheck in 1 hour. Notify MD/NP if >450 after recheck. Check blood sugar AC & HS   levothyroxine 200 MCG tablet Commonly known as:  SYNTHROID, LEVOTHROID Take 200 mcg by mouth daily before breakfast. Except on Sunday. Do not take with other medications   lidocaine 5 % Commonly known as:   LIDODERM Place 1 patch onto the skin daily. Remove & Discard patch within 12 hours or as directed by MD   multivitamin with minerals Tabs tablet Take 1 tablet by mouth daily.   pantoprazole 40 MG tablet Commonly known as:  PROTONIX Take 1 tablet (40 mg total) by mouth daily.   polyethylene glycol powder powder Commonly known as:  GLYCOLAX/MIRALAX Take 17 g by mouth daily as needed for mild constipation.   polyvinyl alcohol 1.4 % ophthalmic solution Commonly known as:  LIQUIFILM TEARS Place 1 drop into both eyes as needed for dry eyes.   senna-docusate 8.6-50 MG tablet Commonly known as:  Senokot-S Take 1 tablet by mouth at bedtime as needed for mild constipation.   tiotropium 18 MCG inhalation capsule Commonly known as:  SPIRIVA Place 1 capsule (18 mcg total) into inhaler and inhale daily.   traZODone 50 MG tablet Commonly known as:  DESYREL Take 0.5 tablets (25 mg total)  by mouth at bedtime as needed for sleep.   warfarin 10 MG tablet Commonly known as:  COUMADIN Take 1 tablet (10 mg total) by mouth daily at 6 PM. What changed:  Another medication with the same name was removed. Continue taking this medication, and follow the directions you see here.         Additional Information    Shela Leff, LCSW

## 2018-04-05 NOTE — Care Management (Signed)
Another home health/private duty list provided to a different son (lives in [place]).  They have reached out to several- one scheduled to start on Wednesday. I am still not certain if patient will return to [facility] or to home with [facility].  I expect they have already pain [facility] for the month however that has not been verified.

## 2018-04-05 NOTE — Progress Notes (Signed)
[place] [facility] [place] NAME: Jessica Erickson    MR#:  [id number]  DATE OF BIRTH:  07-Nov-1926  SUBJECTIVE:  CHIEF COMPLAINT:  pts sob is better, getting transferred to floor.  Son at bedside  REVIEW OF SYSTEMS:  CONSTITUTIONAL: No fever, fatigue or weakness.  EYES: No blurred or double vision.  EARS, NOSE, AND THROAT: No tinnitus or ear pain.  RESPIRATORY: Intermittent episodes of cough, shortness of breath, no wheezing or hemoptysis.  CARDIOVASCULAR: No chest pain, orthopnea, edema.  GASTROINTESTINAL: No nausea, vomiting, diarrhea or abdominal pain.  GENITOURINARY: No dysuria, hematuria.  ENDOCRINE: No polyuria, nocturia,  HEMATOLOGY: No anemia, easy bruising or bleeding SKIN: No rash or lesion. MUSCULOSKELETAL: No joint pain or arthritis.   NEUROLOGIC: No tingling, numbness, weakness.  PSYCHIATRY: No anxiety or depression.   DRUG ALLERGIES:   Allergies  Allergen Reactions  . Penicillins Shortness Of Breath, Rash and Other (See Comments)    Has patient had a PCN reaction causing immediate rash, facial/tongue/throat swelling, SOB or lightheadedness with hypotension: Yes Has patient had a PCN reaction causing severe rash involving mucus membranes or skin necrosis: No Has patient had a PCN reaction that required hospitalization No Has patient had a PCN reaction occurring within the last 10 years: No If all of the above answers are "NO", then may proceed with Cephalosporin use.  . Sulfa Antibiotics Shortness Of Breath, Rash and Other (See Comments)  . Amitiza [Lubiprostone]     N/v/d  . Aspirin Other (See Comments)    Reaction:  Unknown  Other reaction(s): Bleeding (intolerance) Per patient " causes nose to bleed" Can take 81 mg daily without any complications Other reaction(s): "bloody nose"   . Penicillin G Other (See Comments)    Has patient had a PCN reaction causing immediate rash, facial/tongue/throat swelling, SOB or  lightheadedness with hypotension: No Has patient had a PCN reaction causing severe rash involving mucus membranes or skin necrosis: Unknown Has patient had a PCN reaction that required hospitalization: Unknown Has patient had a PCN reaction occurring within the last 10 years: Unknown If all of the above answers are "NO", then may proceed with Cephalosporin use.    VITALS:  Blood pressure (!) 119/52, pulse 67, temperature 98.9 F (37.2 C), temperature source Oral, resp. rate 18, height '5\' 7"'  (1.702 m), weight 101 kg (222 lb 10.6 oz), SpO2 96 %.  PHYSICAL EXAMINATION:  GENERAL:  84 y.o.-year-old patient lying in the bed with no acute distress.  EYES: Pupils equal, round, reactive to light and accommodation. No scleral icterus. Extraocular muscles intact.  HEENT: Head atraumatic, normocephalic. Oropharynx and nasopharynx clear.  NECK:  Supple, no jugular venous distention. No thyroid enlargement, no tenderness.  LUNGS:  moderate breath sounds bilaterally, no wheezing, rales,rhonchi or crepitation. No use of accessory muscles of respiration.  CARDIOVASCULAR: S1, S2 normal. No murmurs, rubs, or gallops.  ABDOMEN: Soft, nontender, nondistended. Bowel sounds present. No organomegaly or mass.  EXTREMITIES: No pedal edema, cyanosis, or clubbing.  NEUROLOGIC: Cranial nerves II through XII are intact. Muscle strength 5/5 in all extremities. Sensation intact. Gait not checked.  PSYCHIATRIC: The patient is alert and oriented x 3.  SKIN: No obvious rash, lesion, or ulcer.    LABORATORY PANEL:   CBC Recent Labs  Lab 04/03/18 2149  WBC 9.3  HGB 10.5*  HCT 30.9*  PLT 337   ------------------------------------------------------------------------------------------------------------------  Chemistries  Recent Labs  Lab 04/03/18 2149  NA 137  K 3.9  CL 100*  CO2 29  GLUCOSE 133*  BUN 18  CREATININE 1.05*  CALCIUM 7.9*  AST 36  ALT 19  ALKPHOS 69  BILITOT 0.4    ------------------------------------------------------------------------------------------------------------------  Cardiac Enzymes Recent Labs  Lab 04/03/18 2149  TROPONINI <0.03   ------------------------------------------------------------------------------------------------------------------  RADIOLOGY:  Ct Angio Chest Pe W And/or Wo Contrast  Result Date: 04/04/2018 CLINICAL DATA:  Fever to 102.1. EXAM: CT ANGIOGRAPHY CHEST WITH CONTRAST TECHNIQUE: Multidetector CT imaging of the chest was performed using the standard protocol during bolus administration of intravenous contrast. Multiplanar CT image reconstructions and MIPs were obtained to evaluate the vascular anatomy. CONTRAST:  73m ISOVUE-370 IOPAMIDOL (ISOVUE-370) INJECTION 76% COMPARISON:  10/08/2015 FINDINGS: Cardiovascular: The heart is top-normal in size without pericardial effusion. No acute pulmonary embolus. No aortic aneurysm or dissection. There is mild aortic atherosclerosis. Conventional branch pattern of the great vessels off the arch. Mediastinum/Nodes: No axillary, mediastinal nor hilar lymphadenopathy. Patent trachea and mainstem bronchus. Status post thyroidectomy. 7 mm hyperdensity along the right thyroid cartilage may reflect stigmata of old remote trauma or post treatment change given thyroidectomy, series 4, image 7. This causes slight deviation of the right vocal cord medially. A small cartilaginous lesion is not entirely excluded. Lungs/Pleura: Inspissated mucus and bronchiectasis within the lower lobes bilaterally with more peripheral airspace opacities compatible with atelectasis and/or pneumonia. No pulmonary edema, effusion or pneumothorax. Upper Abdomen: No acute abnormality. Musculoskeletal: No aggressive osseous lesions. Review of the MIP images confirms the above findings. IMPRESSION: 1. Bilateral lower lobe bronchiectasis with areas of inspissated mucus. More peripheral pulmonary consolidations and  atelectasis within both lower lobes as a result. Suspect small foci of pneumonia admixed with atelectasis accounting for this appearance. Consider short-term follow-up to assure improvement and/or resolution after treating for pneumonia in 4-6 weeks. 2. Status post thyroidectomy. Right thyroid cartilaginous hyperdense 7 mm focus slightly deviating the right vocal cord medially. Differential possibilities may include a nonspecific small cartilaginous lesion of the thyroid cartilage, post treatment change given thyroidectomy or possibly posttraumatic with soft tissue mineralization. Consider short-term interval follow-up of the neck in 3 months. 3. No acute pulmonary embolus. Aortic Atherosclerosis (ICD10-I70.0). Electronically Signed   By: DAshley RoyaltyM.D.   On: 04/04/2018 00:36   UKoreaVenous Img Upper Uni Right  Result Date: 04/04/2018 CLINICAL DATA:  Right arm swelling for 1 week. Large area of bruising and discoloration medial to the elbow and extending into the upper forearm. EXAM: Right UPPER EXTREMITY VENOUS DOPPLER ULTRASOUND TECHNIQUE: Gray-scale sonography with graded compression, as well as color Doppler and duplex ultrasound were performed to evaluate the upper extremity deep venous system from the level of the subclavian vein and including the jugular, axillary, basilic, radial, ulnar and upper cephalic vein. Spectral Doppler was utilized to evaluate flow at rest and with distal augmentation maneuvers. COMPARISON:  None. FINDINGS: Contralateral Subclavian Vein: Respiratory phasicity is normal and symmetric with the symptomatic side. No evidence of thrombus. Normal compressibility. Internal Jugular Vein: No evidence of thrombus. Normal compressibility, respiratory phasicity and response to augmentation. Subclavian Vein: No evidence of thrombus. Normal compressibility, respiratory phasicity and response to augmentation. Axillary Vein: No evidence of thrombus. Normal compressibility, respiratory phasicity  and response to augmentation. Cephalic Vein: No evidence of thrombus. Normal compressibility, respiratory phasicity and response to augmentation. Basilic Vein: No evidence of thrombus. Normal compressibility, respiratory phasicity and response to augmentation. Brachial Veins: No evidence of thrombus. Normal compressibility, respiratory phasicity and response to augmentation. Radial Veins: No evidence of thrombus. Normal  compressibility, respiratory phasicity and response to augmentation. Ulnar Veins: No evidence of thrombus. Normal compressibility, respiratory phasicity and response to augmentation. Venous Reflux:  None visualized. Other Findings: In the right medial elbow region, extending to the upper forearm, there is a circumscribed mixed echotexture structure in the subcutaneous soft tissues measuring 8.7 x 2.8 x 3.8 cm. No flow is demonstrated within the lesion on color flow Doppler imaging. Appearance is most consistent with a soft tissue hematoma. IMPRESSION: No evidence of DVT within the right upper extremity. Soft tissue hematoma in the medial right elbow region extending to the upper forearm and measuring up to 8.7 cm maximal diameter. Electronically Signed   By: Lucienne Capers M.D.   On: 04/04/2018 02:20   Dg Chest Portable 1 View  Result Date: 04/03/2018 CLINICAL DATA:  Fever of 102.1. EXAM: PORTABLE CHEST 1 VIEW COMPARISON:  CXR 10/08/2015 and chest CT 10/08/2015 FINDINGS: Normal size cardiac silhouette. Aortic atherosclerosis with uncoiling. No acute osseous abnormality. Low lung volumes with chronic interstitial prominence and bibasilar atelectasis. No pulmonary consolidation, CHF, effusion or pneumothorax. IMPRESSION: Low lung volumes with bibasilar atelectasis. Aortic atherosclerosis. Electronically Signed   By: Ashley Royalty M.D.   On: 04/03/2018 22:08    EKG:   Orders placed or performed during the hospital encounter of 04/03/18  . EKG 12-Lead  . EKG 12-Lead  . EKG 12-Lead  . EKG  12-Lead    ASSESSMENT AND PLAN:    1. Healthcare associated pneumonia:  patient initially treated with aztreonam and vancomycin .  Vancomycin discontinued as MRSA PCR is negative and aztreonam changed to cefepime - doubt bacterial infection with procalcitonin <0.1 but continue empiric Abx for now as she is sick . Supplemental oxygen as needed. Obtain sputum sample if possible.  2. Sepsis: Patient met criteria via tachypnea and fever at the time of admission she is hemodynamically stable.  Her lactic acid is decreasing with intravenous hydration. Blood culture collected on May 1 with no growth so far .  MRSA PCR is negative Urine culture and sensitivity ordered for abnormal urinalysis  patient was initially treated with aztreonam and vancomycin. Antibiotics changed to cefepime -Continue Midodrine)  3. CKD: Stage III; baseline for the last 6 months. Avoid nephrotoxic agents.  4. Diabetes mellitus type 2: Continue sliding scale insulin while hospitalized.  5. Seizure disorder: Continue Tegretol. The patient is very sensitive to timing of her antiepileptic medication  6.  History of DVT status post IVC filter on Coumadin; monitor PT/INR and Coumadin management by pharmacy  Generalized weakness physical therapy is recommending home health PT with 24-hour supervision but I had a lengthy discussion with the son who is leaning towards long-term care at [facility] at this time   All the records are reviewed and case discussed with Care Management/Social Workerr. Management plans discussed with the patient, family and they are in agreement.  CODE STATUS:   TOTAL TIME TAKING CARE OF THIS PATIENT: 36 minutes.   POSSIBLE D/C IN 1-2 DAYS, DEPENDING ON CLINICAL CONDITION.  Note: This dictation was prepared with Dragon dictation along with smaller phrase technology. Any transcriptional errors that result from this process are unintentional.   Nicholes Mango M.D on 04/05/2018 at 7:35  PM  Between 7am to 6pm - Pager - [phone] After 6pm go to [email or web address] - password [email or web address] [facility]  [place]  Office  [phone]  CC: Primary care physician; McLean-Scocuzza, Nino Glow, MD

## 2018-04-05 NOTE — Progress Notes (Signed)
ANTICOAGULATION CONSULT NOTE - Follow Up Consult  Pharmacy Consult for warfarin dosing  Indication: History of DVT  Allergies  Allergen Reactions  . Penicillins Shortness Of Breath, Rash and Other (See Comments)    Has patient had a PCN reaction causing immediate rash, facial/tongue/throat swelling, SOB or lightheadedness with hypotension: Yes Has patient had a PCN reaction causing severe rash involving mucus membranes or skin necrosis: No Has patient had a PCN reaction that required hospitalization No Has patient had a PCN reaction occurring within the last 10 years: No If all of the above answers are "NO", then may proceed with Cephalosporin use.  . Sulfa Antibiotics Shortness Of Breath, Rash and Other (See Comments)  . Amitiza [Lubiprostone]     N/v/d  . Aspirin Other (See Comments)    Reaction:  Unknown  Other reaction(s): Bleeding (intolerance) Per patient " causes nose to bleed" Can take 81 mg daily without any complications Other reaction(s): "bloody nose"   . Penicillin G Other (See Comments)    Has patient had a PCN reaction causing immediate rash, facial/tongue/throat swelling, SOB or lightheadedness with hypotension: No Has patient had a PCN reaction causing severe rash involving mucus membranes or skin necrosis: Unknown Has patient had a PCN reaction that required hospitalization: Unknown Has patient had a PCN reaction occurring within the last 10 years: Unknown If all of the above answers are "NO", then may proceed with Cephalosporin use.    Patient Measurements: Height: 5\' 7"  (170.2 cm) Weight: 222 lb 10.6 oz (101 kg) IBW/kg (Calculated) : 61.6  Vital Signs: Temp: 98.9 F (37.2 C) (05/03 1326) Temp Source: Oral (05/03 1326) BP: 119/52 (05/03 1326) Pulse Rate: 67 (05/03 1326)  Labs: Recent Labs    04/03/18 2149 04/03/18 2328  HGB 10.5*  --   HCT 30.9*  --   PLT 337  --   LABPROT  --  18.9*  INR  --  1.60  CREATININE 1.05*  --   TROPONINI <0.03  --      Estimated Creatinine Clearance: 44.4 mL/min (A) (by C-G formula based on SCr of 1.05 mg/dL (H)).   Medications:  Scheduled:  . acetylcysteine  3 mL Nebulization TID  . albuterol  2.5 mg Nebulization TID  . aspirin EC  81 mg Oral Daily  . atorvastatin  40 mg Oral q1800  . calcium-vitamin D  1 tablet Oral Daily  . carbamazepine  200 mg Oral BID  . docusate sodium  100 mg Oral BID  . enoxaparin (LOVENOX) injection  1 mg/kg Subcutaneous Q12H  . feeding supplement (ENSURE ENLIVE)  237 mL Oral BID BM  . fluticasone  1 spray Each Nare Daily  . fluticasone furoate-vilanterol  1 puff Inhalation Daily  . gabapentin  100 mg Oral BID  . guaiFENesin  600 mg Oral Daily  . insulin aspart  0-15 Units Subcutaneous TID WC  . insulin aspart  0-5 Units Subcutaneous QHS  . levothyroxine  200 mcg Oral QAC breakfast  . midodrine  10 mg Oral TID WC  . multivitamin with minerals  1 tablet Oral Daily  . pantoprazole  40 mg Oral Daily  . tiotropium  18 mcg Inhalation Daily  . warfarin  10 mg Oral q1800  . Warfarin - Pharmacist Dosing Inpatient   Does not apply q1800    Assessment: Pharmacy consulted for warfarin management for 84 yo female with history of DVT. Patient also had IVC filter. PE and DVT have been ruled out this admission. Patient currently  receiving enoxaparin 1mg /kg Q12hr.    Goal of Therapy:  INR 2-3 Monitor platelets by anticoagulation protocol: Yes   Plan:  Patient is on carbamazepine as an outpatient and takes warfarin 10mg  as an outpatient. Will resume warfarin 10mg  daily. Will obtain INR with am labs. Will follow INR daily while patient is admitted and on antibiotics.   Madix Blowe L 04/05/2018,5:22 PM

## 2018-04-05 NOTE — Care Management (Addendum)
This RNCM received call from another daughter that happens to be a Chief Strategy Officer in [place].  She is very disappointed in the regression of her mother. She wants patient to at least be given the chance to get back to her baseline- able to walk. She said her experience with [facility] was not good and that she only received PT once out of 4 days of being there. I talked to her about appointing someone to talk to in the family between all siblings- she agreed.  I explained that PT will evaluate her. She wants her in a reliable SNF.

## 2018-04-05 NOTE — Progress Notes (Signed)
Pharmacy Antibiotic Note  Jessica Erickson is a 84 y.o. female admitted on 04/03/2018 with pneumonia.  Pharmacy has been consulted for cefepime dosing.   Plan: Procalcitonin remaines < 0.1, patient is afebrile. Will transition patient to cefepime 1g IV Q12hr.   Height: 5\' 7"  (170.2 cm) Weight: 222 lb 10.6 oz (101 kg) IBW/kg (Calculated) : 61.6  Temp (24hrs), Avg:98.3 F (36.8 C), Min:97.7 F (36.5 C), Max:99.1 F (37.3 C)  Recent Labs  Lab 03/30/18 0745 03/31/18 0458 04/03/18 2149 04/03/18 2328  WBC 6.6 7.0 9.3  --   CREATININE 1.28*  --  1.05*  --   LATICACIDVEN  --   --  1.5 0.9    Estimated Creatinine Clearance: 44.4 mL/min (A) (by C-G formula based on SCr of 1.05 mg/dL (H)).    Allergies  Allergen Reactions  . Penicillins Shortness Of Breath, Rash and Other (See Comments)    Has patient had a PCN reaction causing immediate rash, facial/tongue/throat swelling, SOB or lightheadedness with hypotension: Yes Has patient had a PCN reaction causing severe rash involving mucus membranes or skin necrosis: No Has patient had a PCN reaction that required hospitalization No Has patient had a PCN reaction occurring within the last 10 years: No If all of the above answers are "NO", then may proceed with Cephalosporin use.  . Sulfa Antibiotics Shortness Of Breath, Rash and Other (See Comments)  . Amitiza [Lubiprostone]     N/v/d  . Aspirin Other (See Comments)    Reaction:  Unknown  Other reaction(s): Bleeding (intolerance) Per patient " causes nose to bleed" Can take 81 mg daily without any complications Other reaction(s): "bloody nose"   . Penicillin G Other (See Comments)    Has patient had a PCN reaction causing immediate rash, facial/tongue/throat swelling, SOB or lightheadedness with hypotension: No Has patient had a PCN reaction causing severe rash involving mucus membranes or skin necrosis: Unknown Has patient had a PCN reaction that required hospitalization: Unknown Has  patient had a PCN reaction occurring within the last 10 years: Unknown If all of the above answers are "NO", then may proceed with Cephalosporin use.    Antimicrobials this admission: Vancomycin, aztreonam 5/2   Cefepime 5/2 >>  Dose adjustments this admission: 5/3 Cefepime decreased to 1g IV Q12hr.    Microbiology results: 5/1 BCx X 1: no growth x 2 days  5/2 MRSA PCR: negative   Thank you for allowing pharmacy to be a part of this patient's care.  Tristina Sahagian L 04/05/2018 8:57 PM

## 2018-04-05 NOTE — Progress Notes (Signed)
podiatry contacted and do not have nail trimming provided for inpatient, only outpatient.

## 2018-04-05 NOTE — Clinical Social Work Note (Signed)
CSW has left message for Lattie Haw at [facility] to determine if they can take patient back at discharge. In patient's current condition, it is not expected that they would be able to manage her care however, CSW is awaiting their disposition. Shela Leff MSW,LCSW [phone]

## 2018-04-05 NOTE — Clinical Social Work Note (Signed)
Clinical Social Work Assessment  Patient Details  Name: Jessica Erickson MRN: [id number] Date of Birth: 20-Jan-1926  Date of referral:  04/05/18               Reason for consult:  Discharge Planning                Permission sought to share information with:    Permission granted to share information::     Name::        Agency::     Relationship::     Contact Information:     Housing/Transportation Living arrangements for the past 2 months:  [place] of Information:  Adult Children Patient Interpreter Needed:  None Criminal Activity/Legal Involvement Pertinent to Current Situation/Hospitalization:  No - Comment as needed Significant Relationships:  Adult Children Lives with:    Do you feel safe going back to the place where you live?  Yes Need for family participation in patient care:  Yes (Comment)  Care giving concerns:  Patient resides at [facility].   Social Worker assessment / plan:  CSW spoke with patient's sons: Jessica Erickson: [phone] and Jessica Erickson: [phone]. CSW introduced self and explained role and purpose of visit. Patient's son's both expressed concern regarding patient not discharging before it is time. CSW discussed discharge plan when time. PT assessed this afternoon and have recommended home health PT with 24 hour assist. CSW has left a message for Jessica Erickson at [facility]: [phone], regarding this as patient would be able to return. Patient would have [facility] upon return to the facility. Patient's family will decide after patient returns to [facility] if patient will remain there or potentially return home with 24/7 care.  Employment status:  Retired Surveyor, [employer] Care PT Recommendations:  Home with [place] / Referral to community resources:     Patient/Family's Response to care:  Patient's son's were appreciative of CSW assistance.  Patient/Family's Understanding of and Emotional Response  to Diagnosis, Current Treatment, and Prognosis:  Patient's family very concerned that she was discharged too soon last admission and are wanting to ensure discharge is appropriate when time.   Emotional Assessment Appearance:    Attitude/Demeanor/Rapport:    Affect (typically observed):    Orientation:    Alcohol / Substance use:  Not Applicable Psych involvement (Current and /or in the community):  No (Comment)  Discharge Needs  Concerns to be addressed:  Care Coordination Readmission within the last 30 days:  Yes Current discharge risk:  None Barriers to Discharge:  No Barriers Identified   Shela Leff, LCSW 04/05/2018, 3:57 PM

## 2018-04-05 NOTE — Evaluation (Signed)
Physical Therapy Evaluation Patient Details Name: Jessica Erickson MRN: [id number] DOB: 02/01/1926 Today's Date: 04/05/2018   History of Present Illness  Pt is a 84 y/o F who presented with fever, SOB.  CTA of chest revealed foci of pneumonia interspersed among atelectasis.  Pt admitted with sepsis with hypotension secondary to UT and possible healthcare associated pneumonia.  Imaging 5/2 of RUE with no evidence of DVT but with hematoma.  Of note, pt with extensive Bil DVTs on 03/27/18 and was treated and d/c from this hospital.  Pt's PMH includes acoustic neuroma, cataract, HOH, CVA, COPD, DVT, osteoporosis, PE, TIA, trigeminal neuralgia, brain surgery (schwnnoma removal 1996).      Clinical Impression  Pt admitted with above diagnosis. Pt currently with functional limitations due to the deficits listed below (see PT Problem List). Jessica Erickson reports feeling tired but was agreeable to work with therapy.  She currently requires min guard assist for bed mobility and short distance ambulation (40 ft) for safety.  Pt fatigues quickly when ambulating, however remains steady.  She requires min assist for sit>stand to steady when rising.  Per son, pt can have 24/7 assist/supervision from family and friends at d/c.  Pt will benefit from skilled PT to increase their independence and safety with mobility to allow discharge to the venue listed below.      Follow Up Recommendations Home health PT;Supervision/Assistance - 24 hour    Equipment Recommendations  None recommended by PT    Recommendations for Other Services       Precautions / Restrictions Precautions Precautions: Fall Restrictions Weight Bearing Restrictions: No      Mobility  Bed Mobility Overal bed mobility: Needs Assistance Bed Mobility: Supine to Sit     Supine to sit: Min guard;HOB elevated     General bed mobility comments: Increased time and effort but no physical assist or cues needed.  Transfers Overall transfer level:  Needs assistance Equipment used: Rolling walker (2 wheeled) Transfers: Sit to/from Stand Sit to Stand: Min assist         General transfer comment: Min assist to remain steady but pt with proper and safe technique to stand.  Pt with poor hand placement with stand>sit but immediately after verbalizes this.    Ambulation/Gait Ambulation/Gait assistance: Min guard Ambulation Distance (Feet): 40 Feet Assistive device: Rolling walker (2 wheeled) Gait Pattern/deviations: Step-through pattern;Decreased stride length;Trunk flexed     General Gait Details: Pt with flexed posture and slow but steady gait.  Cues to keep RW closer to her which improves only slightly.  Pt fatigues quickly.  SpO2 94% on RA.  Min guard for safety.   Stairs            Wheelchair Mobility    Modified Rankin (Stroke Patients Only)       Balance Overall balance assessment: Needs assistance Sitting-balance support: No upper extremity supported;Feet supported Sitting balance-Leahy Scale: Good     Standing balance support: Single extremity supported;During functional activity Standing balance-Leahy Scale: Poor Standing balance comment: Pt relies on at least 1UE support for static and dynamic activities                             Pertinent Vitals/Pain Pain Assessment: No/denies pain    Home Living Family/patient expects to be discharged to:: Assisted living Living Arrangements: Alone Available Help at Discharge: Family;Available 24 hours/day Type of Home: Other(Comment)(condo) Home Access: Level entry     Home  Layout: One level Home Equipment: Wheelchair - [employer] - 4 wheels;Bedside commode;Grab bars - toilet;Grab bars - tub/shower Additional Comments: Son reports the goal of eventually returning home with 24/7 assist/supervision from family and friends    Prior Function Level of Independence: Needs assistance   Gait / Transfers Assistance Needed: Pt able to ambulate very  short distances ~20 ft with rollator.  Pt has not had any falls in the past 6 months.   ADL's / Homemaking Assistance Needed: Requires assist with bathing, dressing.          Hand Dominance        Extremity/Trunk Assessment   Upper Extremity Assessment Upper Extremity Assessment: RUE deficits/detail;LUE deficits/detail RUE Deficits / Details: Large hematoma from R elbow to R shoulder.  Strength grossly 4/5 LUE Deficits / Details: Strength grossly 4/5    Lower Extremity Assessment Lower Extremity Assessment: (Strength grossly 4/5 BLE)    Cervical / Trunk Assessment Cervical / Trunk Assessment: Kyphotic  Communication   Communication: HOH  Cognition Arousal/Alertness: Awake/alert Behavior During Therapy: WFL for tasks assessed/performed Overall Cognitive Status: Within Functional Limits for tasks assessed                                        General Comments General comments (skin integrity, edema, etc.): Son and daughter in law present during evaluation    Exercises General Exercises - Upper Extremity Shoulder Flexion: AROM;Both;10 reps;Supine General Exercises - Lower Extremity Ankle Circles/Pumps: AROM;Both;10 reps;Supine Quad Sets: Strengthening;Both;10 reps;Supine Straight Leg Raises: Both;10 reps;Supine   Assessment/Plan    PT Assessment Patient needs continued PT services  PT Problem List Decreased strength;Decreased activity tolerance;Decreased balance;Decreased knowledge of use of DME;Decreased safety awareness       PT Treatment Interventions DME instruction;Gait training;Functional mobility training;Therapeutic activities;Therapeutic exercise;Balance training;Neuromuscular re-education;Patient/family education    PT Goals (Current goals can be found in the Care Plan section)  Acute Rehab PT Goals Patient Stated Goal: to get stronger PT Goal Formulation: With patient Time For Goal Achievement: 04/19/18 Potential to Achieve Goals:  Good    Frequency Min 2X/week   Barriers to discharge        Co-evaluation               AM-PAC PT "6 Clicks" Daily Activity  Outcome Measure Difficulty turning over in bed (including adjusting bedclothes, sheets and blankets)?: A Lot Difficulty moving from lying on back to sitting on the side of the bed? : A Lot Difficulty sitting down on and standing up from a chair with arms (e.g., wheelchair, bedside commode, etc,.)?: Unable Help needed moving to and from a bed to chair (including a wheelchair)?: A Little Help needed walking in hospital room?: A Little Help needed climbing 3-5 steps with a railing? : Total 6 Click Score: 12    End of Session Equipment Utilized During Treatment: Gait belt Activity Tolerance: Patient tolerated treatment well;Patient limited by fatigue Patient left: in chair;with call bell/phone within reach;with chair alarm set;with family/visitor present;with SCD's reapplied Nurse Communication: Mobility status;Other (comment)(SpO2) PT Visit Diagnosis: Unsteadiness on feet (R26.81);Muscle weakness (generalized) (M62.81);Other abnormalities of gait and mobility (R26.89)    Time: 1500-1540 PT Time Calculation (min) (ACUTE ONLY): 40 min   Charges:   PT Evaluation $PT Eval Low Complexity: 1 Low PT Treatments $Gait Training: 8-22 mins $Therapeutic Activity: 8-22 mins   PT G Codes:  Collie Siad PT, DPT 04/05/2018, 4:02 PM

## 2018-04-06 ENCOUNTER — Other Ambulatory Visit: Payer: Self-pay

## 2018-04-06 LAB — BASIC METABOLIC PANEL
Anion gap: 6 (ref 5–15)
BUN: 11 mg/dL (ref 6–20)
CO2: 28 mmol/L (ref 22–32)
Calcium: 7.4 mg/dL — ABNORMAL LOW (ref 8.9–10.3)
Chloride: 106 mmol/L (ref 101–111)
Creatinine, Ser: 0.85 mg/dL (ref 0.44–1.00)
GFR calc Af Amer: >60 mL/min (ref >60)
GFR calc non Af Amer: 59 mL/min — ABNORMAL LOW (ref >60)
Glucose, Bld: 119 mg/dL — ABNORMAL HIGH (ref 65–99)
Potassium: 3.7 mmol/L (ref 3.5–5.1)
Sodium: 140 mmol/L (ref 135–145)

## 2018-04-06 LAB — URINE CULTURE
Culture: NO GROWTH
Special Requests: NORMAL

## 2018-04-06 LAB — GLUCOSE, CAPILLARY
Glucose-Capillary: 115 mg/dL — ABNORMAL HIGH (ref 65–99)
Glucose-Capillary: 109 mg/dL — ABNORMAL HIGH (ref 65–99)
Glucose-Capillary: 129 mg/dL — ABNORMAL HIGH (ref 65–99)
Glucose-Capillary: 123 mg/dL — ABNORMAL HIGH (ref 65–99)

## 2018-04-06 LAB — PROTIME-INR
INR: 1.32
Prothrombin Time: 16.3 seconds — ABNORMAL HIGH (ref 11.4–15.2)

## 2018-04-06 LAB — CBC
HCT: 26.2 % — ABNORMAL LOW (ref 35.0–47.0)
Hemoglobin: 9.4 g/dL — ABNORMAL LOW (ref 12.0–16.0)
MCH: 33.5 pg (ref 26.0–34.0)
MCHC: 35.7 g/dL (ref 32.0–36.0)
MCV: 94 fL (ref 80.0–100.0)
Platelets: 331 10*3/uL (ref 150–440)
RBC: 2.79 MIL/uL — ABNORMAL LOW (ref 3.80–5.20)
RDW: 15.8 % — ABNORMAL HIGH (ref 11.5–14.5)
WBC: 6.1 10*3/uL (ref 3.6–11.0)

## 2018-04-06 LAB — PROCALCITONIN: Procalcitonin: <0.1 ng/mL

## 2018-04-06 MED ORDER — POLYVINYL ALCOHOL 1.4 % OP SOLN
1.0000 [drp] | OPHTHALMIC | Status: DC | PRN
  Administered 2018-04-06: 1 [drp] via OPHTHALMIC
  Filled 2018-04-06: qty 15

## 2018-04-06 NOTE — Progress Notes (Signed)
Consult for "ingrown nail" received. Initially consulted because pt usually has nail trimmed at outside podiatry office and requested nail care while hospitalized.  Pt's complaint today is her left great toe nail "hangs a little bit."  Is not painful or infected.  She stated she missed her regular appt with her podiatrist because of this admission and was requesting routine nail care.  D/W pt that this can be performed outpt and is not an inpt procedure.   Pt can f/u outpt.

## 2018-04-06 NOTE — Progress Notes (Signed)
[facility] [place] NAME: Jessica Erickson    MR#:  [id number]  DATE OF BIRTH:  1925-12-18  SUBJECTIVE:  CHIEF COMPLAINT:  pts sob is better, 2 Sons at bedside  REVIEW OF SYSTEMS:  CONSTITUTIONAL: No fever, fatigue or weakness.  EYES: No blurred or double vision.  EARS, NOSE, AND THROAT: No tinnitus or ear pain.  RESPIRATORY: Intermittent episodes of cough, shortness of breath, no wheezing or hemoptysis.  CARDIOVASCULAR: No chest pain, orthopnea, edema.  GASTROINTESTINAL: No nausea, vomiting, diarrhea or abdominal pain.  GENITOURINARY: No dysuria, hematuria.  ENDOCRINE: No polyuria, nocturia,  HEMATOLOGY: No anemia, easy bruising or bleeding SKIN: No rash or lesion. MUSCULOSKELETAL: No joint pain or arthritis.   NEUROLOGIC: No tingling, numbness, weakness.  PSYCHIATRY: No anxiety or depression.   DRUG ALLERGIES:   Allergies  Allergen Reactions  . Penicillins Shortness Of Breath, Rash and Other (See Comments)    Has patient had a PCN reaction causing immediate rash, facial/tongue/throat swelling, SOB or lightheadedness with hypotension: Yes Has patient had a PCN reaction causing severe rash involving mucus membranes or skin necrosis: No Has patient had a PCN reaction that required hospitalization No Has patient had a PCN reaction occurring within the last 10 years: No If all of the above answers are "NO", then may proceed with Cephalosporin use.  . Sulfa Antibiotics Shortness Of Breath, Rash and Other (See Comments)  . Amitiza [Lubiprostone]     N/v/d  . Aspirin Other (See Comments)    Reaction:  Unknown  Other reaction(s): Bleeding (intolerance) Per patient " causes nose to bleed" Can take 81 mg daily without any complications Other reaction(s): "bloody nose"   . Penicillin G Other (See Comments)    Has patient had a PCN reaction causing immediate rash, facial/tongue/throat swelling, SOB or lightheadedness with hypotension:  No Has patient had a PCN reaction causing severe rash involving mucus membranes or skin necrosis: Unknown Has patient had a PCN reaction that required hospitalization: Unknown Has patient had a PCN reaction occurring within the last 10 years: Unknown If all of the above answers are "NO", then may proceed with Cephalosporin use.    VITALS:  Blood pressure (!) 122/55, pulse 73, temperature 98.3 F (36.8 C), temperature source Oral, resp. rate 18, height '5\' 7"'  (1.702 m), weight 101 kg (222 lb 11.2 oz), SpO2 96 %.  PHYSICAL EXAMINATION:  GENERAL:  84 y.o.-year-old patient lying in the bed with no acute distress.  EYES: Pupils equal, round, reactive to light and accommodation. No scleral icterus. Extraocular muscles intact.  HEENT: Head atraumatic, normocephalic. Oropharynx and nasopharynx clear.  NECK:  Supple, no jugular venous distention. No thyroid enlargement, no tenderness.  LUNGS:  moderate breath sounds bilaterally, no wheezing, rales,rhonchi or crepitation. No use of accessory muscles of respiration.  CARDIOVASCULAR: S1, S2 normal. No murmurs, rubs, or gallops.  ABDOMEN: Soft, nontender, nondistended. Bowel sounds present. No organomegaly or mass.  EXTREMITIES: No pedal edema, cyanosis, or clubbing.  NEUROLOGIC: Cranial nerves II through XII are intact. Muscle strength 5/5 in all extremities. Sensation intact. Gait not checked.  PSYCHIATRIC: The patient is alert and oriented x 3.  SKIN: No obvious rash, lesion, or ulcer.    LABORATORY PANEL:   CBC Recent Labs  Lab 04/06/18 0528  WBC 6.1  HGB 9.4*  HCT 26.2*  PLT 331   ------------------------------------------------------------------------------------------------------------------  Chemistries  Recent Labs  Lab 04/03/18 2149 04/06/18 0528  NA 137 140  K 3.9 3.7  CL 100* 106  CO2 29 28  GLUCOSE 133* 119*  BUN 18 11  CREATININE 1.05* 0.85  CALCIUM 7.9* 7.4*  AST 36  --   ALT 19  --   ALKPHOS 69  --   BILITOT  0.4  --    ------------------------------------------------------------------------------------------------------------------  Cardiac Enzymes Recent Labs  Lab 04/03/18 2149  TROPONINI <0.03   ------------------------------------------------------------------------------------------------------------------  RADIOLOGY:  No results found.  EKG:   Orders placed or performed during the hospital encounter of 04/03/18  . EKG 12-Lead  . EKG 12-Lead  . EKG 12-Lead  . EKG 12-Lead    ASSESSMENT AND PLAN:    1. Healthcare associated pneumonia:  patient initially treated with aztreonam and vancomycin .  Vancomycin discontinued as MRSA PCR is negative and aztreonam changed to cefepime - doubt bacterial infection with procalcitonin <0.1 but continue empiric Abx for now as she is sick . Supplemental oxygen as needed. Obtain sputum sample if possible.  2. Sepsis: Patient met criteria via tachypnea and fever at the time of admission she is hemodynamically stable.  Her lactic acid is decreasing with intravenous hydration. Blood culture collected on May 1 with no growth so far .  MRSA PCR is negative Urine culture and sensitivity pending  patient was initially treated with aztreonam and vancomycin. Antibiotics changed to cefepime -Continue Midodrine)  3. CKD: Stage III; baseline for the last 6 months. Avoid nephrotoxic agents.  4. Diabetes mellitus type 2: Continue sliding scale insulin while hospitalized.  5. Seizure disorder: Continue Tegretol. The patient is very sensitive to timing of her antiepileptic medication  6.  History of DVT status post IVC filter on Coumadin; monitor PT/INR and Coumadin management by pharmacy  patient is getting Lovenox therapeutic dose as INR is subtherapeutic at this time 1.32  7.  Right toe pain from ingrown toenail-causing unsteady gait Podiatry consulted  Family is concerned outpatient podiatry follow-up regarding ingrown toenail as  patient is  on Coumadin Generalized weakness physical therapy is recommending home health PT with 24-hour supervision but I had a lengthy discussion with the son who is leaning towards long-term care at [facility] at this time   All the records are reviewed and case discussed with Care Management/Social Workerr. Management plans discussed with the patient, 2 sons at bedside and they are in agreement.  CODE STATUS:   TOTAL TIME TAKING CARE OF THIS PATIENT: 36 minutes.   POSSIBLE D/C IN 1-2 DAYS, DEPENDING ON CLINICAL CONDITION.  Note: This dictation was prepared with Dragon dictation along with smaller phrase technology. Any transcriptional errors that result from this process are unintentional.   Nicholes Mango M.D on 04/06/2018 at 4:14 PM  Between 7am to 6pm - Pager - [phone] After 6pm go to [email or web address] - password [email or web address] [facility]  [facility]  Office  [phone]  CC: Primary care physician; McLean-Scocuzza, Nino Glow, MD

## 2018-04-06 NOTE — Progress Notes (Signed)
ANTICOAGULATION CONSULT NOTE - Follow Up Consult  Pharmacy Consult for warfarin dosing  Indication: History of DVT  Allergies  Allergen Reactions  . Penicillins Shortness Of Breath, Rash and Other (See Comments)    Has patient had a PCN reaction causing immediate rash, facial/tongue/throat swelling, SOB or lightheadedness with hypotension: Yes Has patient had a PCN reaction causing severe rash involving mucus membranes or skin necrosis: No Has patient had a PCN reaction that required hospitalization No Has patient had a PCN reaction occurring within the last 10 years: No If all of the above answers are "NO", then may proceed with Cephalosporin use.  . Sulfa Antibiotics Shortness Of Breath, Rash and Other (See Comments)  . Amitiza [Lubiprostone]     N/v/d  . Aspirin Other (See Comments)    Reaction:  Unknown  Other reaction(s): Bleeding (intolerance) Per patient " causes nose to bleed" Can take 81 mg daily without any complications Other reaction(s): "bloody nose"   . Penicillin G Other (See Comments)    Has patient had a PCN reaction causing immediate rash, facial/tongue/throat swelling, SOB or lightheadedness with hypotension: No Has patient had a PCN reaction causing severe rash involving mucus membranes or skin necrosis: Unknown Has patient had a PCN reaction that required hospitalization: Unknown Has patient had a PCN reaction occurring within the last 10 years: Unknown If all of the above answers are "NO", then may proceed with Cephalosporin use.    Patient Measurements: Height: 5\' 7"  (170.2 cm) Weight: 222 lb 11.2 oz (101 kg) IBW/kg (Calculated) : 61.6  Vital Signs: Temp: 98.3 F (36.8 C) (05/04 1254) Temp Source: Oral (05/04 1254) BP: 122/55 (05/04 1254) Pulse Rate: 73 (05/04 1254)  Labs: Recent Labs    04/03/18 2149 04/03/18 2328 04/06/18 0528  HGB 10.5*  --  9.4*  HCT 30.9*  --  26.2*  PLT 337  --  331  LABPROT  --  18.9* 16.3*  INR  --  1.60 1.32   CREATININE 1.05*  --  0.85  TROPONINI <0.03  --   --     Estimated Creatinine Clearance: 54.8 mL/min (by C-G formula based on SCr of 0.85 mg/dL).   Medications:  Scheduled:  . albuterol  2.5 mg Nebulization TID  . aspirin EC  81 mg Oral Daily  . atorvastatin  40 mg Oral q1800  . calcium-vitamin D  1 tablet Oral Daily  . carbamazepine  200 mg Oral BID  . docusate sodium  100 mg Oral BID  . enoxaparin (LOVENOX) injection  1 mg/kg Subcutaneous Q12H  . feeding supplement (ENSURE ENLIVE)  237 mL Oral BID BM  . fluticasone  1 spray Each Nare Daily  . fluticasone furoate-vilanterol  1 puff Inhalation Daily  . gabapentin  100 mg Oral BID  . guaiFENesin  600 mg Oral Daily  . insulin aspart  0-15 Units Subcutaneous TID WC  . insulin aspart  0-5 Units Subcutaneous QHS  . levothyroxine  200 mcg Oral QAC breakfast  . midodrine  10 mg Oral TID WC  . multivitamin with minerals  1 tablet Oral Daily  . pantoprazole  40 mg Oral Daily  . tiotropium  18 mcg Inhalation Daily  . warfarin  10 mg Oral q1800  . Warfarin - Pharmacist Dosing Inpatient   Does not apply q1800    Assessment: Pharmacy consulted for warfarin management for 84 yo female with history of DVT. Patient also had IVC filter. PE and DVT have been ruled out this admission. Patient  currently receiving enoxaparin 1mg /kg Q12hr.  5/4  INR 1.32    Goal of Therapy:  INR 2-3 Monitor platelets by anticoagulation protocol: Yes   Plan:  Patient is on carbamazepine as an outpatient and takes warfarin 10mg  as an outpatient. Will resume warfarin 10mg  daily. Will obtain INR with am labs. Will follow INR daily while patient is admitted and on antibiotics.  5/4:  INR 1.32.  Will continue with Warfarin 10 mg daily. Patient continues on Enoxaprain 1mg /kg q12h. F/u INR in am  Alzena Gerber A 04/06/2018,1:23 PM

## 2018-04-07 LAB — PROTIME-INR
INR: 1.36
Prothrombin Time: 16.7 seconds — ABNORMAL HIGH (ref 11.4–15.2)

## 2018-04-07 MED ORDER — ACETAMINOPHEN 325 MG PO TABS
650.0000 mg | ORAL_TABLET | ORAL | Status: DC | PRN
  Administered 2018-04-07 – 2018-04-08 (×5): 650 mg via ORAL
  Filled 2018-04-07 (×5): qty 2

## 2018-04-07 MED ORDER — SENNOSIDES-DOCUSATE SODIUM 8.6-50 MG PO TABS: 1.0000 | ORAL_TABLET | Freq: Every evening | ORAL | Status: DC | PRN

## 2018-04-07 MED ORDER — WARFARIN SODIUM 10 MG PO TABS
11.0000 mg | ORAL_TABLET | Freq: Once | ORAL | Status: AC
  Administered 2018-04-07: 11 mg via ORAL
  Filled 2018-04-07: qty 1

## 2018-04-07 MED ORDER — ACETAMINOPHEN 650 MG RE SUPP: 650.0000 mg | Freq: Four times a day (QID) | RECTAL | Status: DC | PRN

## 2018-04-07 MED ORDER — ENOXAPARIN SODIUM 100 MG/ML ~~LOC~~ SOLN
1.0000 mg/kg | Freq: Two times a day (BID) | SUBCUTANEOUS | Status: DC
Start: 2018-04-07 — End: 2018-04-08
  Administered 2018-04-07 – 2018-04-08 (×2): 100 mg via SUBCUTANEOUS
  Filled 2018-04-07 (×3): qty 1

## 2018-04-07 MED ORDER — DOXYCYCLINE HYCLATE 100 MG PO TABS
100.0000 mg | ORAL_TABLET | Freq: Two times a day (BID) | ORAL | Status: DC
  Administered 2018-04-07 – 2018-04-08 (×3): 100 mg via ORAL
  Filled 2018-04-07 (×3): qty 1

## 2018-04-07 NOTE — Progress Notes (Signed)
ANTICOAGULATION CONSULT NOTE -  Pharmacy Consult for Lovenox dosing Indication: DVT  Allergies  Allergen Reactions  . Penicillins Shortness Of Breath, Rash and Other (See Comments)    Has patient had a PCN reaction causing immediate rash, facial/tongue/throat swelling, SOB or lightheadedness with hypotension: Yes Has patient had a PCN reaction causing severe rash involving mucus membranes or skin necrosis: No Has patient had a PCN reaction that required hospitalization No Has patient had a PCN reaction occurring within the last 10 years: No If all of the above answers are "NO", then may proceed with Cephalosporin use.  . Sulfa Antibiotics Shortness Of Breath, Rash and Other (See Comments)  . Amitiza [Lubiprostone]     N/v/d  . Aspirin Other (See Comments)    Reaction:  Unknown  Other reaction(s): Bleeding (intolerance) Per patient " causes nose to bleed" Can take 81 mg daily without any complications Other reaction(s): "bloody nose"   . Penicillin G Other (See Comments)    Has patient had a PCN reaction causing immediate rash, facial/tongue/throat swelling, SOB or lightheadedness with hypotension: No Has patient had a PCN reaction causing severe rash involving mucus membranes or skin necrosis: Unknown Has patient had a PCN reaction that required hospitalization: Unknown Has patient had a PCN reaction occurring within the last 10 years: Unknown If all of the above answers are "NO", then may proceed with Cephalosporin use.    Patient Measurements: Height: 5\' 7"  (170.2 cm) Weight: 219 lb 6.4 oz (99.5 kg) IBW/kg (Calculated) : 61.6 Enoxaparin Dosing Weight: 91 kg  Vital Signs: Temp: 98.6 F (37 C) (05/05 0555) Temp Source: Oral (05/05 0555) BP: 136/97 (05/05 0555) Pulse Rate: 73 (05/05 0555)  Labs: Recent Labs    04/06/18 0528 04/07/18 0451  HGB 9.4*  --   HCT 26.2*  --   PLT 331  --   LABPROT 16.3* 16.7*  INR 1.32 1.36  CREATININE 0.85  --     Estimated  Creatinine Clearance: 54.4 mL/min (by C-G formula based on SCr of 0.85 mg/dL).   Medical History: Past Medical History:  Diagnosis Date  . Acoustic neuroma ([place])   . Allergy   . Asthma   . Bilateral swelling of feet    and legs  . Bladder infection   . CAD (coronary artery disease)   . Cataract   . Change in voice   . Compression fracture of body of thoracic vertebra (HCC)    T12 09/18/15 MRI s/p fall   . Constipation   . COPD (chronic obstructive pulmonary disease) ([place])    previous CXR with chronic interstitial lung dz   . CVA (cerebral vascular accident) ([place])   . Depression   . Diabetes ([place])    with neuropathy  . Diabetes mellitus, type 2 ([place])   . Diarrhea   . Double vision   . DVT (deep venous thrombosis) ([place])    right leg 10/2015 was on coumadin off as of 2017/2018 ; s/p IVC filter  . Enuresis   . Eye pain, right   . Fall   . Fatty liver    09/15/15 also mildly dilated pancreatitic duct rec MRCP small sub cm cyst hemangioma speeln mild right hydronephrorossi and prox. hydroureter, kidney stones, mild scarring kidneys  . Female stress incontinence   . Flank pain   . GERD (gastroesophageal reflux disease)    with small hiatal hernia   . Hard of hearing   . Heart disease   . History of kidney problems   .  Hyperlipidemia    mixed  . Hypertension   . Hypothyroidism, postsurgical   . Impaired mobility and ADLs    uses rolling walker has caretaker 24/7 at home  . Leg edema   . Mixed incontinence urge and stress (female)(female)   . Neuropathy   . Osteoarthritis    DDD spine   . Osteoporosis with fracture    T12 compression fracture  . Photophobia   . Pulmonary embolism ([place])    10/2015 off coumadin as of 04/2016  . Pulmonary HTN (HCC)    mild pulm HTN, echo 10/09/15 EF 82-80%KLKJZ 1 dd, RV systolic pressure increased   . Recurrent UTI   . Sinus pressure   . Skin cancer    BCC jawline and scalp   . Thyroid disease    follows [facility]  . TIA (transient  ischemic attack)    MRI 2009/2010 neg stroke   . Trigeminal neuralgia    Dr. Tomi Bamberger s/p gamma knife x 2, on Tegretol since 2011/2012 no increase in dose >200 mg bid rec per family per neurology   . Urinary frequency   . Urinary, incontinence, stress female    Dr Erlene Quan urology     Medications:  Warfarin PTA.  Assessment: INR 1.36  Goal of Therapy:  Monitor platelets by anticoagulation protocol: Yes   Plan:  Lovenox 1 mg/kg q 12 hours ordered. Patient on Warfarin. F/u labs per protocol.  Jessica Erickson A 04/07/2018,9:34 AM

## 2018-04-07 NOTE — Progress Notes (Signed)
ANTICOAGULATION CONSULT NOTE - Follow Up Consult  Pharmacy Consult for warfarin dosing  Indication: History of DVT  Allergies  Allergen Reactions  . Penicillins Shortness Of Breath, Rash and Other (See Comments)    Has patient had a PCN reaction causing immediate rash, facial/tongue/throat swelling, SOB or lightheadedness with hypotension: Yes Has patient had a PCN reaction causing severe rash involving mucus membranes or skin necrosis: No Has patient had a PCN reaction that required hospitalization No Has patient had a PCN reaction occurring within the last 10 years: No If all of the above answers are "NO", then may proceed with Cephalosporin use.  . Sulfa Antibiotics Shortness Of Breath, Rash and Other (See Comments)  . Amitiza [Lubiprostone]     N/v/d  . Aspirin Other (See Comments)    Reaction:  Unknown  Other reaction(s): Bleeding (intolerance) Per patient " causes nose to bleed" Can take 81 mg daily without any complications Other reaction(s): "bloody nose"   . Penicillin G Other (See Comments)    Has patient had a PCN reaction causing immediate rash, facial/tongue/throat swelling, SOB or lightheadedness with hypotension: No Has patient had a PCN reaction causing severe rash involving mucus membranes or skin necrosis: Unknown Has patient had a PCN reaction that required hospitalization: Unknown Has patient had a PCN reaction occurring within the last 10 years: Unknown If all of the above answers are "NO", then may proceed with Cephalosporin use.    Patient Measurements: Height: 5\' 7"  (170.2 cm) Weight: 219 lb 6.4 oz (99.5 kg) IBW/kg (Calculated) : 61.6  Vital Signs: Temp: 98.6 F (37 C) (05/05 0555) Temp Source: Oral (05/05 0555) BP: 136/97 (05/05 0555) Pulse Rate: 73 (05/05 0555)  Labs: Recent Labs    04/06/18 0528 04/07/18 0451  HGB 9.4*  --   HCT 26.2*  --   PLT 331  --   LABPROT 16.3* 16.7*  INR 1.32 1.36  CREATININE 0.85  --     Estimated Creatinine  Clearance: 54.4 mL/min (by C-G formula based on SCr of 0.85 mg/dL).   Medications:  Scheduled:  . albuterol  2.5 mg Nebulization TID  . aspirin EC  81 mg Oral Daily  . atorvastatin  40 mg Oral q1800  . calcium-vitamin D  1 tablet Oral Daily  . carbamazepine  200 mg Oral BID  . docusate sodium  100 mg Oral BID  . enoxaparin (LOVENOX) injection  1 mg/kg Subcutaneous Q12H  . feeding supplement (ENSURE ENLIVE)  237 mL Oral BID BM  . fluticasone  1 spray Each Nare Daily  . fluticasone furoate-vilanterol  1 puff Inhalation Daily  . gabapentin  100 mg Oral BID  . guaiFENesin  600 mg Oral Daily  . insulin aspart  0-15 Units Subcutaneous TID WC  . insulin aspart  0-5 Units Subcutaneous QHS  . levothyroxine  200 mcg Oral QAC breakfast  . midodrine  10 mg Oral TID WC  . multivitamin with minerals  1 tablet Oral Daily  . pantoprazole  40 mg Oral Daily  . tiotropium  18 mcg Inhalation Daily  . warfarin  10 mg Oral q1800  . Warfarin - Pharmacist Dosing Inpatient   Does not apply q1800    Assessment: Pharmacy consulted for warfarin management for 84 yo female with history of DVT. Patient also had IVC filter. PE and DVT have been ruled out this admission. Patient currently receiving enoxaparin 1mg /kg Q12hr.  5/4  INR 1.32  Warfarin 10 mg 5/5  INR 1.36  Goal of  Therapy:  INR 2-3 Monitor platelets by anticoagulation protocol: Yes   Plan:  Patient is on carbamazepine as an outpatient and takes warfarin 10mg  as an outpatient. Will resume warfarin 10mg  daily. Will obtain INR with am labs. Will follow INR daily while patient is admitted and on antibiotics.  5/4:  INR 1.32.  Will continue with Warfarin 10 mg daily. Patient continues on Enoxaprain 1mg /kg q12h. F/u INR in am  5/5: INR 1.36. Will order Warfarin 11 mg po x 1 tonight.  Patient on Enoxaparin 1 mg/kg q12h. F.u INR  Chanel Mckesson A 04/07/2018,9:27 AM

## 2018-04-07 NOTE — Progress Notes (Signed)
[facility] [place] NAME: Jessica Erickson    MR#:  [id number]  DATE OF BIRTH:  28-Nov-1926  SUBJECTIVE:  CHIEF COMPLAINT:  pts sob is better, out of bed to chair.  Had a trigeminal neuralgia attack this morning but Tylenol is helping.  Son and daughter-in-law at bedside  REVIEW OF SYSTEMS:  CONSTITUTIONAL: No fever, fatigue or weakness.  EYES: No blurred or double vision.  EARS, NOSE, AND THROAT: No tinnitus or ear pain.  RESPIRATORY: Intermittent episodes of cough, shortness of breath, no wheezing or hemoptysis.  CARDIOVASCULAR: No chest pain, orthopnea, edema.  GASTROINTESTINAL: No nausea, vomiting, diarrhea or abdominal pain.  GENITOURINARY: No dysuria, hematuria.  ENDOCRINE: No polyuria, nocturia,  HEMATOLOGY: No anemia, easy bruising or bleeding SKIN: No rash or lesion. MUSCULOSKELETAL: No joint pain or arthritis.   NEUROLOGIC: No tingling, numbness, weakness.  PSYCHIATRY: No anxiety or depression.   DRUG ALLERGIES:   Allergies  Allergen Reactions  . Penicillins Shortness Of Breath, Rash and Other (See Comments)    Has patient had a PCN reaction causing immediate rash, facial/tongue/throat swelling, SOB or lightheadedness with hypotension: Yes Has patient had a PCN reaction causing severe rash involving mucus membranes or skin necrosis: No Has patient had a PCN reaction that required hospitalization No Has patient had a PCN reaction occurring within the last 10 years: No If all of the above answers are "NO", then may proceed with Cephalosporin use.  . Sulfa Antibiotics Shortness Of Breath, Rash and Other (See Comments)  . Amitiza [Lubiprostone]     N/v/d  . Aspirin Other (See Comments)    Reaction:  Unknown  Other reaction(s): Bleeding (intolerance) Per patient " causes nose to bleed" Can take 81 mg daily without any complications Other reaction(s): "bloody nose"   . Penicillin G Other (See Comments)    Has patient had  a PCN reaction causing immediate rash, facial/tongue/throat swelling, SOB or lightheadedness with hypotension: No Has patient had a PCN reaction causing severe rash involving mucus membranes or skin necrosis: Unknown Has patient had a PCN reaction that required hospitalization: Unknown Has patient had a PCN reaction occurring within the last 10 years: Unknown If all of the above answers are "NO", then may proceed with Cephalosporin use.    VITALS:  Blood pressure (!) 135/55, pulse 66, temperature 98.1 F (36.7 C), temperature source Oral, resp. rate 19, height '5\' 7"'  (1.702 m), weight 99.5 kg (219 lb 6.4 oz), SpO2 97 %.  PHYSICAL EXAMINATION:  GENERAL:  84 y.o.-year-old patient lying in the bed with no acute distress.  EYES: Pupils equal, round, reactive to light and accommodation. No scleral icterus. Extraocular muscles intact.  HEENT: Head atraumatic, normocephalic. Oropharynx and nasopharynx clear.  NECK:  Supple, no jugular venous distention. No thyroid enlargement, no tenderness.  LUNGS:  moderate breath sounds bilaterally, no wheezing, rales,rhonchi or crepitation. No use of accessory muscles of respiration.  CARDIOVASCULAR: S1, S2 normal. No murmurs, rubs, or gallops.  ABDOMEN: Soft, nontender, nondistended. Bowel sounds present. No organomegaly or mass.  EXTREMITIES: No pedal edema, cyanosis, or clubbing.  NEUROLOGIC: Cranial nerves II through XII are intact. Muscle strength 5/5 in all extremities. Sensation intact. Gait not checked.  PSYCHIATRIC: The patient is alert and oriented x 3.  SKIN: No obvious rash, lesion, or ulcer.    LABORATORY PANEL:   CBC Recent Labs  Lab 04/06/18 0528  WBC 6.1  HGB 9.4*  HCT 26.2*  PLT 331   ------------------------------------------------------------------------------------------------------------------  Chemistries  Recent Labs  Lab 04/03/18 2149 04/06/18 0528  NA 137 140  K 3.9 3.7  CL 100* 106  CO2 29 28  GLUCOSE 133* 119*   BUN 18 11  CREATININE 1.05* 0.85  CALCIUM 7.9* 7.4*  AST 36  --   ALT 19  --   ALKPHOS 69  --   BILITOT 0.4  --    ------------------------------------------------------------------------------------------------------------------  Cardiac Enzymes Recent Labs  Lab 04/03/18 2149  TROPONINI <0.03   ------------------------------------------------------------------------------------------------------------------  RADIOLOGY:  No results found.  EKG:   Orders placed or performed during the hospital encounter of 04/03/18  . EKG 12-Lead  . EKG 12-Lead  . EKG 12-Lead  . EKG 12-Lead    ASSESSMENT AND PLAN:    1. Healthcare associated pneumonia:  patient initially treated with aztreonam and vancomycin .  Vancomycin discontinued as MRSA PCR is negative and aztreonam changed to cefepime - doubt bacterial infection with procalcitonin <0.1 . antibiotics changed to doxycycline . Supplemental oxygen as needed. Obtain sputum sample if possible.  2. Sepsis: Patient met criteria via tachypnea and fever at the time of admission she is hemodynamically stable.  Her lactic acid is decreasing with intravenous hydration. Blood culture collected on May 1 with no growth so far .  MRSA PCR is negative Urine culture and sensitivity with no growth  patient was initially treated with aztreonam and vancomycin. Antibiotics changed to cefepime, resistant to doxycycline for pneumonia -Continue Midodrine)  3. CKD: Stage III; baseline for the last 6 months. Avoid nephrotoxic agents.  4. Diabetes mellitus type 2: Continue sliding scale insulin while hospitalized.  5. Seizure disorder: Continue Tegretol. The patient is very sensitive to timing of her antiepileptic medication  6.  History of DVT status post IVC filter on Coumadin; monitor PT/INR and Coumadin management by pharmacy  patient is getting Lovenox therapeutic dose as INR is subtherapeutic at this time Will consider changing it  to Eliquis if patient and family agreeable INR 1.36  7.  Right toe pain from ingrown toenail-causing unsteady gait Podiatry consulted , seen by Dr. Vickki Muff as it is not painful or infected recommended outpatient follow-up  8.  Acute on chronic trigeminal neuralgia Renew home medication Tegretol and Tylenol as needed    Generalized weakness physical therapy is recommending home health PT with 24-hour supervision but I had a lengthy discussion with the son who is leaning towards long-term care at [facility] at this time, they will take her home soon after they arrange caregivers at home   All the records are reviewed and case discussed with Care Management/Social Workerr. Management plans discussed with the patient, son and daughter-in-law at bedside and they are in agreement.  CODE STATUS:   TOTAL TIME TAKING CARE OF THIS PATIENT: 36 minutes.   POSSIBLE D/C IN 1 DAYS, DEPENDING ON CLINICAL CONDITION.  Note: This dictation was prepared with Dragon dictation along with smaller phrase technology. Any transcriptional errors that result from this process are unintentional.   Nicholes Mango M.D on 04/07/2018 at 4:37 PM  Between 7am to 6pm - Pager - [phone] After 6pm go to [email or web address] - password [email or web address]  [facility]  Office  [phone]  CC: Primary care physician; McLean-Scocuzza, Nino Glow, MD

## 2018-04-08 LAB — GLUCOSE, CAPILLARY
Glucose-Capillary: 103 mg/dL — ABNORMAL HIGH (ref 65–99)
Glucose-Capillary: 122 mg/dL — ABNORMAL HIGH (ref 65–99)
Glucose-Capillary: 118 mg/dL — ABNORMAL HIGH (ref 65–99)
Glucose-Capillary: 90 mg/dL (ref 65–99)
Glucose-Capillary: 111 mg/dL — ABNORMAL HIGH (ref 65–99)
Glucose-Capillary: 95 mg/dL (ref 65–99)

## 2018-04-08 LAB — PROTIME-INR
INR: 1.53
Prothrombin Time: 18.3 seconds — ABNORMAL HIGH (ref 11.4–15.2)

## 2018-04-08 LAB — CULTURE, BLOOD (ROUTINE X 2)
Culture: NO GROWTH
Special Requests: ADEQUATE

## 2018-04-08 MED ORDER — POLYVINYL ALCOHOL 1.4 % OP SOLN: 1.0000 [drp] | OPHTHALMIC | 0 refills | Status: DC | PRN

## 2018-04-08 MED ORDER — ENOXAPARIN SODIUM 100 MG/ML ~~LOC~~ SOLN: 1.0000 mg/kg | Freq: Two times a day (BID) | SUBCUTANEOUS | 0 refills | Status: DC

## 2018-04-08 MED ORDER — DOXYCYCLINE HYCLATE 100 MG PO TABS: 100.0000 mg | ORAL_TABLET | Freq: Two times a day (BID) | ORAL | 0 refills | Status: DC

## 2018-04-08 MED ORDER — HYDROCODONE-ACETAMINOPHEN 5-325 MG PO TABS: 1.0000 | ORAL_TABLET | Freq: Four times a day (QID) | ORAL | 0 refills | Status: DC | PRN

## 2018-04-08 MED ORDER — ALBUTEROL SULFATE (2.5 MG/3ML) 0.083% IN NEBU: 2.5000 mg | INHALATION_SOLUTION | Freq: Three times a day (TID) | RESPIRATORY_TRACT | 12 refills | Status: DC

## 2018-04-08 MED ORDER — WARFARIN SODIUM 10 MG PO TABS: 10.0000 mg | ORAL_TABLET | Freq: Every day | ORAL | 0 refills | Status: DC

## 2018-04-08 MED ORDER — SENNOSIDES-DOCUSATE SODIUM 8.6-50 MG PO TABS: 1.0000 | ORAL_TABLET | Freq: Every evening | ORAL | Status: DC | PRN

## 2018-04-08 NOTE — Care Management (Addendum)
Patient to return to [facility] with Home health nurse and physical therapy. She was discharged 4/29 on lovenox to bridge the coumadin.  She again will discharge on lovenox and coumadin.  Notified attending of need for orders for labs for PT/INR and informed [facility].  CM informed that patient is discharging on nebulizer solution. Contacted [facility] and patient does not have a machine.  Obtained order and notified [facility] who will provide.

## 2018-04-08 NOTE — Discharge Instructions (Addendum)
Follow-up with a primary care physician at the facility in 3 to 5 days Check PT/INR on 04/09/2018 as it is subtherapeutic and further management of the Lovenox therapeutic dose and Coumadin by the primary care physician  -Outpatient follow-up with a podiatrist in 1 to 2 weeks  Information on my medicine - Coumadin   (Warfarin)  This medication education was reviewed with me or my healthcare representative as part of my discharge preparation.  The pharmacist that spoke with me during my hospital stay was:  Ramond Dial, [facility]  Why was Coumadin prescribed for you? Coumadin was prescribed for you because you have a blood clot or a medical condition that can cause an increased risk of forming blood clots. Blood clots can cause serious health problems by blocking the flow of blood to the heart, lung, or brain. Coumadin can prevent harmful blood clots from forming. As a reminder your indication for Coumadin is:   Deep Vein Thrombosis Treatment  What test will check on my response to Coumadin? While on Coumadin (warfarin) you will need to have an INR test regularly to ensure that your dose is keeping you in the desired range. The INR (international normalized ratio) number is calculated from the result of the laboratory test called prothrombin time (PT).  If an INR APPOINTMENT HAS NOT ALREADY BEEN MADE FOR YOU please schedule an appointment to have this lab work done by your health care provider within 7 days. Your INR goal is usually a number between:  2 to 3 or your provider may give you a more narrow range like 2-2.5.  Ask your health care provider during an office visit what your goal INR is.  What  do you need to  know  About  COUMADIN? Take Coumadin (warfarin) exactly as prescribed by your healthcare provider about the same time each day.  DO NOT stop taking without talking to the doctor who prescribed the medication.  Stopping without other blood clot prevention medication to take the place of  Coumadin may increase your risk of developing a new clot or stroke.  Get refills before you run out.  What do you do if you miss a dose? If you miss a dose, take it as soon as you remember on the same day then continue your regularly scheduled regimen the next day.  Do not take two doses of Coumadin at the same time.  Important Safety Information A possible side effect of Coumadin (Warfarin) is an increased risk of bleeding. You should call your healthcare provider right away if you experience any of the following: ? Bleeding from an injury or your nose that does not stop. ? Unusual colored urine (red or dark brown) or unusual colored stools (red or black). ? Unusual bruising for unknown reasons. ? A serious fall or if you hit your head (even if there is no bleeding).  Some foods or medicines interact with Coumadin (warfarin) and might alter your response to warfarin. To help avoid this: ? Eat a balanced diet, maintaining a consistent amount of Vitamin K. ? Notify your provider about major diet changes you plan to make. ? Avoid alcohol or limit your intake to 1 drink for women and 2 drinks for men per day. (1 drink is 5 oz. wine, 12 oz. beer, or 1.5 oz. liquor.)  Make sure that ANY health care provider who prescribes medication for you knows that you are taking Coumadin (warfarin).  Also make sure the healthcare provider who is monitoring your  Coumadin knows when you have started a new medication including herbals and non-prescription products.  Coumadin (Warfarin)  Major Drug Interactions  Increased Warfarin Effect Decreased Warfarin Effect  Alcohol (large quantities) Antibiotics (esp. Septra/Bactrim, Flagyl, Cipro) Amiodarone (Cordarone) Aspirin (ASA) Cimetidine (Tagamet) Megestrol (Megace) NSAIDs (ibuprofen, naproxen, etc.) Piroxicam (Feldene) Propafenone (Rythmol SR) Propranolol (Inderal) Isoniazid (INH) Posaconazole (Noxafil) Barbiturates (Phenobarbital) Carbamazepine  (Tegretol) Chlordiazepoxide (Librium) Cholestyramine (Questran) Griseofulvin Oral Contraceptives Rifampin Sucralfate (Carafate) Vitamin K   Coumadin (Warfarin) Major Herbal Interactions  Increased Warfarin Effect Decreased Warfarin Effect  Garlic Ginseng Ginkgo biloba Coenzyme Q10 Green tea St. Johns wort    Coumadin (Warfarin) FOOD Interactions  Eat a consistent number of servings per week of foods HIGH in Vitamin K (1 serving =  cup)  Collards (cooked, or boiled & drained) Kale (cooked, or boiled & drained) Mustard greens (cooked, or boiled & drained) Parsley *serving size only =  cup Spinach (cooked, or boiled & drained) Swiss chard (cooked, or boiled & drained) Turnip greens (cooked, or boiled & drained)  Eat a consistent number of servings per week of foods MEDIUM-HIGH in Vitamin K (1 serving = 1 cup)  Asparagus (cooked, or boiled & drained) Broccoli (cooked, boiled & drained, or raw & chopped) Brussel sprouts (cooked, or boiled & drained) *serving size only =  cup Lettuce, raw (green leaf, endive, romaine) Spinach, raw Turnip greens, raw & chopped   These websites have more information on Coumadin (warfarin):  FailFactory.se; VeganReport.com.au;

## 2018-04-08 NOTE — Progress Notes (Signed)
ANTICOAGULATION CONSULT NOTE - Follow Up Consult  Pharmacy Consult for warfarin dosing  Indication: History of DVT  Allergies  Allergen Reactions  . Penicillins Shortness Of Breath, Rash and Other (See Comments)    Has patient had a PCN reaction causing immediate rash, facial/tongue/throat swelling, SOB or lightheadedness with hypotension: Yes Has patient had a PCN reaction causing severe rash involving mucus membranes or skin necrosis: No Has patient had a PCN reaction that required hospitalization No Has patient had a PCN reaction occurring within the last 10 years: No If all of the above answers are "NO", then may proceed with Cephalosporin use.  . Sulfa Antibiotics Shortness Of Breath, Rash and Other (See Comments)  . Amitiza [Lubiprostone]     N/v/d  . Aspirin Other (See Comments)    Reaction:  Unknown  Other reaction(s): Bleeding (intolerance) Per patient " causes nose to bleed" Can take 81 mg daily without any complications Other reaction(s): "bloody nose"   . Penicillin G Other (See Comments)    Has patient had a PCN reaction causing immediate rash, facial/tongue/throat swelling, SOB or lightheadedness with hypotension: No Has patient had a PCN reaction causing severe rash involving mucus membranes or skin necrosis: Unknown Has patient had a PCN reaction that required hospitalization: Unknown Has patient had a PCN reaction occurring within the last 10 years: Unknown If all of the above answers are "NO", then may proceed with Cephalosporin use.    Patient Measurements: Height: 5\' 7"  (170.2 cm) Weight: 214 lb 15.2 oz (97.5 kg) IBW/kg (Calculated) : 61.6  Vital Signs: Temp: 97.9 F (36.6 C) (05/06 0517) Temp Source: Oral (05/06 0517) BP: 154/68 (05/06 0517) Pulse Rate: 67 (05/06 0517)  Labs: Recent Labs    04/06/18 0528 04/07/18 0451 04/08/18 0501  HGB 9.4*  --   --   HCT 26.2*  --   --   PLT 331  --   --   LABPROT 16.3* 16.7* 18.3*  INR 1.32 1.36 1.53   CREATININE 0.85  --   --     Estimated Creatinine Clearance: 53.8 mL/min (by C-G formula based on SCr of 0.85 mg/dL).   Medications:  Scheduled:  . albuterol  2.5 mg Nebulization TID  . aspirin EC  81 mg Oral Daily  . atorvastatin  40 mg Oral q1800  . calcium-vitamin D  1 tablet Oral Daily  . carbamazepine  200 mg Oral BID  . docusate sodium  100 mg Oral BID  . doxycycline  100 mg Oral Q12H  . enoxaparin (LOVENOX) injection  1 mg/kg Subcutaneous Q12H  . feeding supplement (ENSURE ENLIVE)  237 mL Oral BID BM  . fluticasone  1 spray Each Nare Daily  . fluticasone furoate-vilanterol  1 puff Inhalation Daily  . gabapentin  100 mg Oral BID  . guaiFENesin  600 mg Oral Daily  . insulin aspart  0-15 Units Subcutaneous TID WC  . insulin aspart  0-5 Units Subcutaneous QHS  . levothyroxine  200 mcg Oral QAC breakfast  . midodrine  10 mg Oral TID WC  . multivitamin with minerals  1 tablet Oral Daily  . pantoprazole  40 mg Oral Daily  . tiotropium  18 mcg Inhalation Daily  . Warfarin - Pharmacist Dosing Inpatient   Does not apply q1800    Assessment: Pharmacy consulted for warfarin management for 84 yo female with history of DVT. Patient also had IVC filter. PE and DVT have been ruled out this admission. Patient currently receiving enoxaparin 1mg /kg Q12hr.  Patient is on carbamazepine as an outpatient and takes warfarin 10mg  as an outpatient (this is a relatively new dose, pt was d/c on 4/29 on 7.5mg  and admitted of 5/1. INR on admission was 1.6. Will resume warfarin 10mg  daily. Will obtain INR with am labs. Will follow INR daily while patient is admitted and on antibiotics.  5/3  INR          Warfarin 10mg  5/4  INR 1.32  Warfarin 10 mg 5/5  INR 1.36  Warfarin 11mg  5/6 INR  1.53  Goal of Therapy:  INR 2-3 Monitor platelets by anticoagulation protocol: Yes   Plan:  Will continue with 10mg  daily. If no increase tomorrow, will give higher dose tomorrow. Continue therapeutic lovenox  1mg /kg q 12hr  Melissa D Maccia, Pharm.D, BCPS Clinical Pharmacist  04/08/2018,7:24 AM

## 2018-04-08 NOTE — Clinical Social Work Note (Signed)
Patient is to discharge today to return to [facility]. CSW has coordinated with Lattie Haw at [facility] and they can take her back and will transport her approximately at 3:00 pm. CSW has updated patient's son: Jessica Erickson: [phone] throughout the day and has asked that he update his siblings. Discharge information has been sent to [facility]. Shela Leff MSW,LCSW [phone]

## 2018-04-08 NOTE — Care Management Important Message (Signed)
Copy of signed IM left in patient's room.    

## 2018-04-08 NOTE — Progress Notes (Signed)
Discharge teaching given to patient, patient verbalized understanding, but is very hard of hearing patient voiced no concerns ,and had no questions. Patient IV removed. Patient will be transported back to [facility] by their transportation services. All patient belongings gathered prior to leaving. Nebulizer delivered prior to leaving.

## 2018-04-08 NOTE — Discharge Summary (Signed)
[facility] NAME: Jessica Erickson    MR#:  [id number]  DATE OF BIRTH:  Aug 19, 1926  DATE OF ADMISSION:  04/03/2018 ADMITTING PHYSICIAN: Harrie Foreman, MD  DATE OF DISCHARGE:  04/08/18  PRIMARY CARE PHYSICIAN: McLean-Scocuzza, Nino Glow, MD    ADMISSION DIAGNOSIS:  Swelling [R60.9] HCAP (healthcare-associated pneumonia) [J18.9]   History of DVT with subtherapeutic INR  DISCHARGE DIAGNOSIS:  Active Problems:   HCAP (healthcare-associated pneumonia) History of DVT with subtherapeutic INR  SECONDARY DIAGNOSIS:   Past Medical History:  Diagnosis Date  . Acoustic neuroma ([place])   . Allergy   . Asthma   . Bilateral swelling of feet    and legs  . Bladder infection   . CAD (coronary artery disease)   . Cataract   . Change in voice   . Compression fracture of body of thoracic vertebra (HCC)    T12 09/18/15 MRI s/p fall   . Constipation   . COPD (chronic obstructive pulmonary disease) ([place])    previous CXR with chronic interstitial lung dz   . CVA (cerebral vascular accident) ([place])   . Depression   . Diabetes ([place])    with neuropathy  . Diabetes mellitus, type 2 ([place])   . Diarrhea   . Double vision   . DVT (deep venous thrombosis) ([place])    right leg 10/2015 was on coumadin off as of 2017/2018 ; s/p IVC filter  . Enuresis   . Eye pain, right   . Fall   . Fatty liver    09/15/15 also mildly dilated pancreatitic duct rec MRCP small sub cm cyst hemangioma speeln mild right hydronephrorossi and prox. hydroureter, kidney stones, mild scarring kidneys  . Female stress incontinence   . Flank pain   . GERD (gastroesophageal reflux disease)    with small hiatal hernia   . Hard of hearing   . Heart disease   . History of kidney problems   . Hyperlipidemia    mixed  . Hypertension   . Hypothyroidism, postsurgical   . Impaired mobility and ADLs    uses rolling walker has caretaker 24/7 at home  . Leg edema   . Mixed  incontinence urge and stress (female)(female)   . Neuropathy   . Osteoarthritis    DDD spine   . Osteoporosis with fracture    T12 compression fracture  . Photophobia   . Pulmonary embolism ([place])    10/2015 off coumadin as of 04/2016  . Pulmonary HTN (HCC)    mild pulm HTN, echo 10/09/15 EF 09-62%EZMOQ 1 dd, RV systolic pressure increased   . Recurrent UTI   . Sinus pressure   . Skin cancer    BCC jawline and scalp   . Thyroid disease    follows [facility]  . TIA (transient ischemic attack)    MRI 2009/2010 neg stroke   . Trigeminal neuralgia    Dr. Tomi Bamberger s/p gamma knife x 2, on Tegretol since 2011/2012 no increase in dose >200 mg bid rec per family per neurology   . Urinary frequency   . Urinary, incontinence, stress female    Dr Erlene Quan urology     [facility] COURSE:   HPI: Patient with past medical history of recent thrombectomy, remote CVA, hypertension, diabetes and COPD presents to the emergency department with fever.  T-max 102.5 degrees Fahrenheit.  The patient complained of shortness of breath and due to her history of DVT in the  emergency department staff performed CTA of chest which revealed foci of pneumonia interspersed among atelectasis.  Patient complains of right upper extremity pain due to another DVT found in that arm.  Due to her recent hospitalization patient was started on broad-spectrum antibiotics prior to calling the hospitalist service for admission.  1. Healthcare associated pneumonia:  patient initially treated with aztreonam and vancomycin.  Vancomycin discontinued as MRSA PCR is negative and aztreonam changed to cefepime - doubt bacterial infection with procalcitonin <0.1 . antibiotics changed to doxycycline . Supplemental oxygen as needed. Obtain sputum sample if possible.  2. Sepsis: Patient met criteria via tachypnea and fever at the time of admission she is hemodynamically stable.  Her lactic acid is decreasing with intravenous  hydration. Blood culture collected on May 1 with no growth so far .  MRSA PCR is negative Urine culture and sensitivity with no growth  patient was initially treated with aztreonam and vancomycin. Antibiotics changed to cefepime, changed  to doxycycline for pneumonia -Continue Midodrine)  3. CKD: Stage III; baseline for the last 6 months. Avoid nephrotoxic agents.  4. Diabetes mellitus type 2: Continue sliding scale insulin while hospitalized.  5. Seizure disorder: Continue Tegretol. The patient is very sensitive to timing of her antiepileptic medication  6.  History of DVT status post IVC filter on Coumadin; monitor PT/INR and Coumadin management by pharmacy  patient is getting Lovenox therapeutic dose as INR is subtherapeutic at this time Will consider changing it to Eliquis if patient and family agreeable INR 1.36  7.  Right toe pain from ingrown toenail-causing unsteady gait Podiatry consulted , seen by Dr. Vickki Muff as it is not painful or infected recommended outpatient follow-up  8.  Acute on chronic trigeminal neuralgia Renew home medication Tegretol and Tylenol as needed    Generalized weakness physical therapy is recommending home health PT with 24-hour supervision but I had a lengthy discussion with the sons they are leaning to take her to long-term care at [facility] at this time, they will take her home soon after they arrange caregivers at home Discussed with son Junella Domke today.  Agreeable with the discharge plan of care     DISCHARGE CONDITIONS:   fair  CONSULTS OBTAINED:  Treatment Team:  Samara Deist, DPM   PROCEDURES  None   DRUG ALLERGIES:   Allergies  Allergen Reactions  . Penicillins Shortness Of Breath, Rash and Other (See Comments)    Has patient had a PCN reaction causing immediate rash, facial/tongue/throat swelling, SOB or lightheadedness with hypotension: Yes Has patient had a PCN reaction causing severe rash involving mucus  membranes or skin necrosis: No Has patient had a PCN reaction that required hospitalization No Has patient had a PCN reaction occurring within the last 10 years: No If all of the above answers are "NO", then may proceed with Cephalosporin use.  . Sulfa Antibiotics Shortness Of Breath, Rash and Other (See Comments)  . Amitiza [Lubiprostone]     N/v/d  . Aspirin Other (See Comments)    Reaction:  Unknown  Other reaction(s): Bleeding (intolerance) Per patient " causes nose to bleed" Can take 81 mg daily without any complications Other reaction(s): "bloody nose"   . Penicillin G Other (See Comments)    Has patient had a PCN reaction causing immediate rash, facial/tongue/throat swelling, SOB or lightheadedness with hypotension: No Has patient had a PCN reaction causing severe rash involving mucus membranes or skin necrosis: Unknown Has patient had a PCN reaction that required hospitalization: Unknown  Has patient had a PCN reaction occurring within the last 10 years: Unknown If all of the above answers are "NO", then may proceed with Cephalosporin use.    DISCHARGE MEDICATIONS:   Allergies as of 04/08/2018      Reactions   Penicillins Shortness Of Breath, Rash, Other (See Comments)   Has patient had a PCN reaction causing immediate rash, facial/tongue/throat swelling, SOB or lightheadedness with hypotension: Yes Has patient had a PCN reaction causing severe rash involving mucus membranes or skin necrosis: No Has patient had a PCN reaction that required hospitalization No Has patient had a PCN reaction occurring within the last 10 years: No If all of the above answers are "NO", then may proceed with Cephalosporin use.   Sulfa Antibiotics Shortness Of Breath, Rash, Other (See Comments)   Amitiza [lubiprostone]    N/v/d   Aspirin Other (See Comments)   Reaction:  Unknown  Other reaction(s): Bleeding (intolerance) Per patient " causes nose to bleed" Can take 81 mg daily without any  complications Other reaction(s): "bloody nose"   Penicillin G Other (See Comments)   Has patient had a PCN reaction causing immediate rash, facial/tongue/throat swelling, SOB or lightheadedness with hypotension: No Has patient had a PCN reaction causing severe rash involving mucus membranes or skin necrosis: Unknown Has patient had a PCN reaction that required hospitalization: Unknown Has patient had a PCN reaction occurring within the last 10 years: Unknown If all of the above answers are "NO", then may proceed with Cephalosporin use.      Medication List    STOP taking these medications   aspirin EC 81 MG tablet   midodrine 10 MG tablet Commonly known as:  PROAMATINE     TAKE these medications   acetaminophen 325 MG tablet Commonly known as:  TYLENOL Take 650 mg by mouth every 6 (six) hours as needed.   albuterol (2.5 MG/3ML) 0.083% nebulizer solution Commonly known as:  PROVENTIL Take 3 mLs (2.5 mg total) by nebulization 3 (three) times daily.   atorvastatin 40 MG tablet Commonly known as:  LIPITOR Take 40 mg by mouth daily at 6 PM.   bisacodyl 5 MG EC tablet Commonly known as:  DULCOLAX Take 1 tablet (5 mg total) by mouth daily as needed for moderate constipation.   budesonide-formoterol 160-4.5 MCG/ACT inhaler Commonly known as:  SYMBICORT Inhale 2 puffs into the lungs 2 (two) times daily.   CALCIUM 600+D 600-400 MG-UNIT tablet Generic drug:  Calcium Carbonate-Vitamin D Take 1 tablet by mouth daily.   carbamazepine 200 MG tablet Commonly known as:  TEGRETOL Take 1 tablet (200 mg total) by mouth 2 (two) times daily.   docusate sodium 100 MG capsule Commonly known as:  COLACE Take 1 capsule (100 mg total) by mouth 2 (two) times daily.   doxycycline 100 MG tablet Commonly known as:  VIBRA-TABS Take 1 tablet (100 mg total) by mouth every 12 (twelve) hours.   enoxaparin 100 MG/ML injection Commonly known as:  LOVENOX Inject 1 mL (100 mg total) into the skin  every 12 (twelve) hours. What changed:    how much to take  when to take this   ENSURE ENLIVE PO Take 1 Bottle by mouth 2 (two) times daily.   feeding supplement (PRO-STAT SUGAR FREE 64) Liqd Take 30 mLs by mouth 2 (two) times daily between meals.   fluticasone 50 MCG/ACT nasal spray Commonly known as:  FLONASE Place 1 spray into both nostrils daily.   furosemide 40 MG tablet  Commonly known as:  LASIX Take 1 tablet (40 mg total) by mouth daily.   gabapentin 100 MG capsule Commonly known as:  NEURONTIN Take 100 mg by mouth 2 (two) times daily.   guaiFENesin 600 MG 12 hr tablet Commonly known as:  MUCINEX Take 600 mg by mouth daily.   HYDROcodone-acetaminophen 5-325 MG tablet Commonly known as:  NORCO/VICODIN Take 1 tablet by mouth every 6 (six) hours as needed for moderate pain or severe pain.   insulin regular 100 units/mL injection Commonly known as:  NOVOLIN R,HUMULIN R Inject 0-12 Units into the skin 3 (three) times daily before meals. 100 unit/mL; amt: Per Sliding Scale; If Blood Sugar is 176 to 250, give 3 Units.If Blood Sugar is 251 to 325, give 6 Units.If Blood Sugar is 326 to 450, give 10 Units. If Blood Sugar is greater than 450, give 12 Units and recheck in 1 hour. Notify MD/NP if >450 after recheck. Check blood sugar AC & HS   levothyroxine 200 MCG tablet Commonly known as:  SYNTHROID, LEVOTHROID Take 200 mcg by mouth daily before breakfast. Except on Sunday. Do not take with other medications   lidocaine 5 % Commonly known as:  LIDODERM Place 1 patch onto the skin daily. Remove & Discard patch within 12 hours or as directed by MD   multivitamin with minerals Tabs tablet Take 1 tablet by mouth daily.   pantoprazole 40 MG tablet Commonly known as:  PROTONIX Take 1 tablet (40 mg total) by mouth daily.   polyethylene glycol powder powder Commonly known as:  GLYCOLAX/MIRALAX Take 17 g by mouth daily as needed for mild constipation.   polyvinyl alcohol  1.4 % ophthalmic solution Commonly known as:  LIQUIFILM TEARS Place 1 drop into both eyes as needed for dry eyes.   senna-docusate 8.6-50 MG tablet Commonly known as:  Senokot-S Take 1 tablet by mouth at bedtime as needed for mild constipation.   tiotropium 18 MCG inhalation capsule Commonly known as:  SPIRIVA Place 1 capsule (18 mcg total) into inhaler and inhale daily.   traZODone 50 MG tablet Commonly known as:  DESYREL Take 0.5 tablets (25 mg total) by mouth at bedtime as needed for sleep.   warfarin 10 MG tablet Commonly known as:  COUMADIN Take 1 tablet (10 mg total) by mouth daily at 6 PM. What changed:  Another medication with the same name was removed. Continue taking this medication, and follow the directions you see here.        DISCHARGE INSTRUCTIONS:  Follow-up with a primary care physician at the facility in 3 to 5 days Check PT/INR on 04/09/2018 as it is subtherapeutic and further management of the Lovenox therapeutic dose and Coumadin by the primary care physician  -Outpatient follow-up with a podiatrist in 1 to 2 weeks   DIET:  Cardiac diet and Diabetic diet  DISCHARGE CONDITION:  Stable  ACTIVITY:  Activity as tolerated  OXYGEN:  Home Oxygen: No.   Oxygen Delivery: room air  DISCHARGE LOCATION:    Long-term care facility [facility]  If you experience worsening of your admission symptoms, develop shortness of breath, life threatening emergency, suicidal or homicidal thoughts you must seek medical attention immediately by calling 911 or calling your MD immediately  if symptoms less severe.  You Must read complete instructions/literature along with all the possible adverse reactions/side effects for all the Medicines you take and that have been prescribed to you. Take any new Medicines after you have completely understood and accpet  all the possible adverse reactions/side effects.   Please note  You were cared for by a hospitalist during your  hospital stay. If you have any questions about your discharge medications or the care you received while you were in the hospital after you are discharged, you can call the unit and asked to speak with the hospitalist on call if the hospitalist that took care of you is not available. Once you are discharged, your primary care physician will handle any further medical issues. Please note that NO REFILLS for any discharge medications will be authorized once you are discharged, as it is imperative that you return to your primary care physician (or establish a relationship with a primary care physician if you do not have one) for your aftercare needs so that they can reassess your need for medications and monitor your lab values.     Today  Chief Complaint  Patient presents with  . Fever   Patient is doing fine.  Denies any complaints.  Agreeable to go to [facility] today   ROS:  CONSTITUTIONAL: Denies fevers, chills. Denies any fatigue, weakness.  EYES: Denies blurry vision, double vision, eye pain. EARS, NOSE, THROAT: Denies tinnitus, ear pain, hearing loss. RESPIRATORY:  cough improving, denies wheeze, shortness of breath.  CARDIOVASCULAR: Denies chest pain, palpitations, edema.  GASTROINTESTINAL: Denies nausea, vomiting, diarrhea, abdominal pain. Denies bright red blood per rectum. GENITOURINARY: Denies dysuria, hematuria. ENDOCRINE: Denies nocturia or thyroid problems. HEMATOLOGIC AND LYMPHATIC: Denies easy bruising or bleeding. SKIN: Denies rash or lesion. MUSCULOSKELETAL: Denies pain in neck, back, shoulder, knees, hips or arthritic symptoms.  NEUROLOGIC: Denies paralysis, paresthesias.  PSYCHIATRIC: Denies anxiety or depressive symptoms.   VITAL SIGNS:  Blood pressure (!) 154/68, pulse 67, temperature 97.9 F (36.6 C), temperature source Oral, resp. rate 16, height _0  (1.702 m), weight 97.5 kg (214 lb 15.2 oz), SpO2 94 %.  I/O:    Intake/Output Summary (Last 24 hours) at  04/08/2018 1131 Last data filed at 04/08/2018 1016 Gross per 24 hour  Intake 480 ml  Output 1 ml  Net 479 ml    PHYSICAL EXAMINATION:  GENERAL:  84 y.o.-year-old patient lying in the bed with no acute distress.  EYES: Pupils equal, round, reactive to light and accommodation. No scleral icterus. Extraocular muscles intact.  HEENT: Head atraumatic, normocephalic. Oropharynx and nasopharynx clear.  NECK:  Supple, no jugular venous distention. No thyroid enlargement, no tenderness.  LUNGS: Normal breath sounds bilaterally, no wheezing, rales,rhonchi or crepitation. No use of accessory muscles of respiration.  CARDIOVASCULAR: S1, S2 normal. No murmurs, rubs, or gallops.  ABDOMEN: Soft, non-tender, non-distended. Bowel sounds present. No organomegaly or mass.  EXTREMITIES: No pedal edema, cyanosis, or clubbing.  NEUROLOGIC: Cranial nerves II through XII are intact. Muscle strength global weakness in all extremities. Sensation intact. Gait not checked.  PSYCHIATRIC: The patient is alert and oriented x 3.  SKIN: No obvious rash, lesion, or ulcer.   DATA REVIEW:   CBC Recent Labs  Lab 04/06/18 0528  WBC 6.1  HGB 9.4*  HCT 26.2*  PLT 331    Chemistries  Recent Labs  Lab 04/03/18 2149 04/06/18 0528  NA 137 140  K 3.9 3.7  CL 100* 106  CO2 29 28  GLUCOSE 133* 119*  BUN 18 11  CREATININE 1.05* 0.85  CALCIUM 7.9* 7.4*  AST 36  --   ALT 19  --   ALKPHOS 69  --   BILITOT 0.4  --  Cardiac Enzymes Recent Labs  Lab 04/03/18 2149  TROPONINI <0.03    Microbiology Results  Results for orders placed or performed during the hospital encounter of 04/03/18  Culture, blood (Routine x 2)     Status: None   Collection Time: 04/03/18  9:49 PM  Result Value Ref Range Status   Specimen Description BLOOD L AC  Final   Special Requests   Final    BOTTLES DRAWN AEROBIC AND ANAEROBIC Blood Culture adequate volume   Culture   Final    NO GROWTH 5 DAYS Performed at [facility], [address] [place], [place] [address]    Report Status 04/08/2018 FINAL  Final  MRSA PCR Screening     Status: None   Collection Time: 04/04/18  4:28 AM  Result Value Ref Range Status   MRSA by PCR NEGATIVE NEGATIVE Final    Comment:        The GeneXpert MRSA Assay (FDA approved for NASAL specimens only), is one component of a comprehensive MRSA colonization surveillance program. It is not intended to diagnose MRSA infection nor to guide or monitor treatment for MRSA infections. Performed at [facility], [address], [place], [place] [address]   Urine Culture     Status: None   Collection Time: 04/05/18 12:48 PM  Result Value Ref Range Status   Specimen Description   Final    URINE, RANDOM Performed at [facility], [address], [place], [place] [address]    Special Requests   Final    Normal Performed at [facility], [address] [place], [place] [address]    Culture   Final    NO GROWTH Performed at [facility], [place] [address] [place], [place] [address]    Report Status 04/06/2018 FINAL  Final    RADIOLOGY:  No results found.  EKG:   Orders placed or performed during the hospital encounter of 04/03/18  . EKG 12-Lead  . EKG 12-Lead  . EKG 12-Lead  . EKG 12-Lead      Management plans discussed with the patient, family and they are in agreement.  CODE STATUS:     Code Status Orders  (From admission, onward)        Start     Ordered   04/04/18 1557  Do not attempt resuscitation (DNR)  Continuous    Question Answer Comment  In the event of cardiac or respiratory ARREST Do not call a "code blue"   In the event of cardiac or respiratory ARREST Do not perform Intubation, CPR, defibrillation or ACLS   In the event of cardiac or respiratory ARREST Use medication by any route, position, wound care, and other measures to relive pain and suffering. May use oxygen, suction and manual treatment of airway  obstruction as needed for comfort.   Comments Code status was addressed with the son (POA, Kinsey) and he requested DNR.      04/04/18 1557    Code Status History    Date Active Date Inactive Code Status Order ID Comments User Context   04/04/2018 0417 04/04/2018 1557 Full Code [id number]  Harrie Foreman, MD ED   03/27/2018 2303 04/01/2018 2110 Full Code [id number]  Saundra Shelling, MD Inpatient   03/06/2018 0108 03/08/2018 1842 Full Code [id number]  Amelia Jo, MD ED   03/04/2018 2230 03/05/2018 1742 Full Code [id number]  Amelia Jo, MD Inpatient   10/08/2015 2112 10/13/2015 1651 Full Code [id number]  Ether Griffins,  Dorene Sorrow, MD Inpatient   09/16/2015 0234 09/19/2015 0236 Full Code [id number]  Fritzi Mandes, MD Inpatient    Advance Directive Documentation     Most Recent Value  Type of Advance Directive  Healthcare Power of Attorney  Pre-existing out of facility DNR order (yellow form or pink MOST form)  -  "MOST" Form in Place?  -      TOTAL TIME TAKING CARE OF THIS PATIENT:  43 minutes.   Note: This dictation was prepared with Dragon dictation along with smaller phrase technology. Any transcriptional errors that result from this process are unintentional.   _0 @  on 04/08/2018 at 11:31 AM  Between 7am to 6pm - Pager - [phone]  After 6pm go to [email or web address] - password [email or web address]  [facility]  Office  [phone]  CC: Primary care physician; McLean-Scocuzza, Nino Glow, MD

## 2018-04-08 NOTE — Discharge Summary (Signed)
[facility] NAME: Jessica Erickson    MR#:  [id number]  DATE OF BIRTH:  [birth year]  DATE OF ADMISSION:  04/03/2018 ADMITTING PHYSICIAN: Harrie Foreman, MD  DATE OF DISCHARGE:  04/08/18  PRIMARY CARE PHYSICIAN: McLean-Scocuzza, Nino Glow, MD    ADMISSION DIAGNOSIS:  Swelling [R60.9] HCAP (healthcare-associated pneumonia) [J18.9]  DISCHARGE DIAGNOSIS:  Active Problems:   HCAP (healthcare-associated pneumonia) History of DVT with subtherapeutic INR  SECONDARY DIAGNOSIS:   Past Medical History:  Diagnosis Date  . Acoustic neuroma ([place])   . Allergy   . Asthma   . Bilateral swelling of feet    and legs  . Bladder infection   . CAD (coronary artery disease)   . Cataract   . Change in voice   . Compression fracture of body of thoracic vertebra (HCC)    T12 09/18/15 MRI s/p fall   . Constipation   . COPD (chronic obstructive pulmonary disease) ([place])    previous CXR with chronic interstitial lung dz   . CVA (cerebral vascular accident) ([place])   . Depression   . Diabetes ([place])    with neuropathy  . Diabetes mellitus, type 2 ([place])   . Diarrhea   . Double vision   . DVT (deep venous thrombosis) ([place])    right leg 10/2015 was on coumadin off as of 2017/2018 ; s/p IVC filter  . Enuresis   . Eye pain, right   . Fall   . Fatty liver    09/15/15 also mildly dilated pancreatitic duct rec MRCP small sub cm cyst hemangioma speeln mild right hydronephrorossi and prox. hydroureter, kidney stones, mild scarring kidneys  . Female stress incontinence   . Flank pain   . GERD (gastroesophageal reflux disease)    with small hiatal hernia   . Hard of hearing   . Heart disease   . History of kidney problems   . Hyperlipidemia    mixed  . Hypertension   . Hypothyroidism, postsurgical   . Impaired mobility and ADLs    uses rolling walker has caretaker 24/7 at home  . Leg edema   . Mixed incontinence urge and stress (female)(female)    . Neuropathy   . Osteoarthritis    DDD spine   . Osteoporosis with fracture    T12 compression fracture  . Photophobia   . Pulmonary embolism ([place])    10/2015 off coumadin as of 04/2016  . Pulmonary HTN (HCC)    mild pulm HTN, echo 10/09/15 EF 22-02%RKYHC 1 dd, RV systolic pressure increased   . Recurrent UTI   . Sinus pressure   . Skin cancer    BCC jawline and scalp   . Thyroid disease    follows [facility]  . TIA (transient ischemic attack)    MRI 2009/2010 neg stroke   . Trigeminal neuralgia    Dr. Tomi Bamberger s/p gamma knife x 2, on Tegretol since 2011/2012 no increase in dose >200 mg bid rec per family per neurology   . Urinary frequency   . Urinary, incontinence, stress female    Dr Erlene Quan urology     [facility] COURSE:   HPI: Patient with past medical history of recent thrombectomy, remote CVA, hypertension, diabetes and COPD presents to the emergency department with fever.  T-max 102.5 degrees Fahrenheit.  The patient complained of shortness of breath and due to her history of DVT in the emergency department staff performed CTA of chest which  revealed foci of pneumonia interspersed among atelectasis.  Patient complains of right upper extremity pain due to another DVT found in that arm.  Due to her recent hospitalization patient was started on broad-spectrum antibiotics prior to calling the hospitalist service for admission.  1. Healthcare associated pneumonia:  patient initially treated with aztreonam and vancomycin.  Vancomycin discontinued as MRSA PCR is negative and aztreonam changed to cefepime - doubt bacterial infection with procalcitonin <0.1 . antibiotics changed to doxycycline, will provide for 3 more days. Supplemental oxygen as needed. Obtain sputum sample if possible.  2. Sepsis: Patient met criteria via tachypnea and fever at the time of admission she is hemodynamically stable.  Her lactic acid is decreasing with intravenous hydration. Blood culture  collected on May 1 with no growth so far .  MRSA PCR is negative Urine culture and sensitivity with no growth  patient was initially treated with aztreonam and vancomycin. Antibiotics changed to cefepime, resistant to doxycycline for pneumonia -Continue Midodrine)  3. CKD: Stage III; baseline for the last 6 months. Avoid nephrotoxic agents.  4. Diabetes mellitus type 2: Continue sliding scale insulin while hospitalized.  5. Seizure disorder: Continue Tegretol. The patient is very sensitive to timing of her antiepileptic medication  6.  History of DVT status post IVC filter on Coumadin; monitor PT/INR and Coumadin management by pharmacy  patient is getting Lovenox therapeutic dose twice a day as INR is subtherapeutic at this time, continue Lovenox and Coumadin check PT/INR tomorrow at the facility and further management of Coumadin and Lovenox by the primary care physician at the facility   7.  Right toe pain from ingrown toenail-causing unsteady gait Podiatry consulted , seen by Dr. Vickki Muff as it is not painful or infected recommended outpatient follow-up  8.  Acute on chronic trigeminal neuralgia Renew home medication Tegretol and Tylenol as needed    Generalized weakness physical therapy is recommending home health PT with 24-hour supervision but I had a lengthy discussion with the sons at bed side, for now they are leaning towards long-term care at [facility] at this time, they will take her home soon after they arrange caregivers at home Discussed with son [name] today.  Agreeable with discharge today     DISCHARGE CONDITIONS:   FAIR  CONSULTS OBTAINED:  Treatment Team:  Samara Deist, DPM   PROCEDURES  NONE   DRUG ALLERGIES:   Allergies  Allergen Reactions  . Penicillins Shortness Of Breath, Rash and Other (See Comments)    Has patient had a PCN reaction causing immediate rash, facial/tongue/throat swelling, SOB or lightheadedness with  hypotension: Yes Has patient had a PCN reaction causing severe rash involving mucus membranes or skin necrosis: No Has patient had a PCN reaction that required hospitalization No Has patient had a PCN reaction occurring within the last 10 years: No If all of the above answers are "NO", then may proceed with Cephalosporin use.  . Sulfa Antibiotics Shortness Of Breath, Rash and Other (See Comments)  . Amitiza [Lubiprostone]     N/v/d  . Aspirin Other (See Comments)    Reaction:  Unknown  Other reaction(s): Bleeding (intolerance) Per patient " causes nose to bleed" Can take 81 mg daily without any complications Other reaction(s): "bloody nose"   . Penicillin G Other (See Comments)    Has patient had a PCN reaction causing immediate rash, facial/tongue/throat swelling, SOB or lightheadedness with hypotension: No Has patient had a PCN reaction causing severe rash involving mucus membranes or skin necrosis:  Unknown Has patient had a PCN reaction that required hospitalization: Unknown Has patient had a PCN reaction occurring within the last 10 years: Unknown If all of the above answers are "NO", then may proceed with Cephalosporin use.    DISCHARGE MEDICATIONS:   Allergies as of 04/08/2018      Reactions   Penicillins Shortness Of Breath, Rash, Other (See Comments)   Has patient had a PCN reaction causing immediate rash, facial/tongue/throat swelling, SOB or lightheadedness with hypotension: Yes Has patient had a PCN reaction causing severe rash involving mucus membranes or skin necrosis: No Has patient had a PCN reaction that required hospitalization No Has patient had a PCN reaction occurring within the last 10 years: No If all of the above answers are "NO", then may proceed with Cephalosporin use.   Sulfa Antibiotics Shortness Of Breath, Rash, Other (See Comments)   Amitiza [lubiprostone]    N/v/d   Aspirin Other (See Comments)   Reaction:  Unknown  Other reaction(s): Bleeding  (intolerance) Per patient " causes nose to bleed" Can take 81 mg daily without any complications Other reaction(s): "bloody nose"   Penicillin G Other (See Comments)   Has patient had a PCN reaction causing immediate rash, facial/tongue/throat swelling, SOB or lightheadedness with hypotension: No Has patient had a PCN reaction causing severe rash involving mucus membranes or skin necrosis: Unknown Has patient had a PCN reaction that required hospitalization: Unknown Has patient had a PCN reaction occurring within the last 10 years: Unknown If all of the above answers are "NO", then may proceed with Cephalosporin use.      Medication List    STOP taking these medications   aspirin EC 81 MG tablet   enoxaparin 100 MG/ML injection Commonly known as:  LOVENOX   midodrine 10 MG tablet Commonly known as:  PROAMATINE   warfarin 10 MG tablet Commonly known as:  COUMADIN   warfarin 7.5 MG tablet Commonly known as:  COUMADIN     TAKE these medications   acetaminophen 325 MG tablet Commonly known as:  TYLENOL Take 650 mg by mouth every 6 (six) hours as needed.   albuterol (2.5 MG/3ML) 0.083% nebulizer solution Commonly known as:  PROVENTIL Take 3 mLs (2.5 mg total) by nebulization 3 (three) times daily.   atorvastatin 40 MG tablet Commonly known as:  LIPITOR Take 40 mg by mouth daily at 6 PM.   bisacodyl 5 MG EC tablet Commonly known as:  DULCOLAX Take 1 tablet (5 mg total) by mouth daily as needed for moderate constipation.   budesonide-formoterol 160-4.5 MCG/ACT inhaler Commonly known as:  SYMBICORT Inhale 2 puffs into the lungs 2 (two) times daily.   CALCIUM 600+D 600-400 MG-UNIT tablet Generic drug:  Calcium Carbonate-Vitamin D Take 1 tablet by mouth daily.   carbamazepine 200 MG tablet Commonly known as:  TEGRETOL Take 1 tablet (200 mg total) by mouth 2 (two) times daily.   docusate sodium 100 MG capsule Commonly known as:  COLACE Take 1 capsule (100 mg total)  by mouth 2 (two) times daily.   doxycycline 100 MG tablet Commonly known as:  VIBRA-TABS Take 1 tablet (100 mg total) by mouth every 12 (twelve) hours.   ENSURE ENLIVE PO Take 1 Bottle by mouth 2 (two) times daily.   feeding supplement (PRO-STAT SUGAR FREE 64) Liqd Take 30 mLs by mouth 2 (two) times daily between meals.   fluticasone 50 MCG/ACT nasal spray Commonly known as:  FLONASE Place 1 spray into both nostrils daily.  furosemide 40 MG tablet Commonly known as:  LASIX Take 1 tablet (40 mg total) by mouth daily.   gabapentin 100 MG capsule Commonly known as:  NEURONTIN Take 100 mg by mouth 2 (two) times daily.   guaiFENesin 600 MG 12 hr tablet Commonly known as:  MUCINEX Take 600 mg by mouth daily.   HYDROcodone-acetaminophen 5-325 MG tablet Commonly known as:  NORCO/VICODIN Take 1 tablet by mouth every 6 (six) hours as needed for moderate pain or severe pain.   levothyroxine 200 MCG tablet Commonly known as:  SYNTHROID, LEVOTHROID Take 200 mcg by mouth daily before breakfast. Except on Sunday. Do not take with other medications   lidocaine 5 % Commonly known as:  LIDODERM Place 1 patch onto the skin daily. Remove & Discard patch within 12 hours or as directed by MD   multivitamin with minerals Tabs tablet Take 1 tablet by mouth daily.   pantoprazole 40 MG tablet Commonly known as:  PROTONIX Take 1 tablet (40 mg total) by mouth daily.   polyethylene glycol powder powder Commonly known as:  GLYCOLAX/MIRALAX Take 17 g by mouth daily as needed for mild constipation.   polyvinyl alcohol 1.4 % ophthalmic solution Commonly known as:  LIQUIFILM TEARS Place 1 drop into both eyes as needed for dry eyes.   senna-docusate 8.6-50 MG tablet Commonly known as:  Senokot-S Take 1 tablet by mouth at bedtime as needed for mild constipation.   tiotropium 18 MCG inhalation capsule Commonly known as:  SPIRIVA Place 1 capsule (18 mcg total) into inhaler and inhale daily.    traZODone 50 MG tablet Commonly known as:  DESYREL Take 0.5 tablets (25 mg total) by mouth at bedtime as needed for sleep.        DISCHARGE INSTRUCTIONS:   Follow-up with primary care physician at the facility in 3 to 5 days  repeat PT/INR on Apr 09, 2018 at the facility and further management of the Coumadin and Lovenox subcu by the primary care physician at the facility Outpatient follow-up with podiatrist in 1 to 2 weeks   DIET:  Diabetic diet  DISCHARGE CONDITION:  fair  ACTIVITY:  Activity as tolerated  OXYGEN:  Home Oxygen: No.   Oxygen Delivery: room air  DISCHARGE LOCATION:  nursing home   If you experience worsening of your admission symptoms, develop shortness of breath, life threatening emergency, suicidal or homicidal thoughts you must seek medical attention immediately by calling 911 or calling your MD immediately  if symptoms less severe.  You Must read complete instructions/literature along with all the possible adverse reactions/side effects for all the Medicines you take and that have been prescribed to you. Take any new Medicines after you have completely understood and accpet all the possible adverse reactions/side effects.   Please note  You were cared for by a hospitalist during your hospital stay. If you have any questions about your discharge medications or the care you received while you were in the hospital after you are discharged, you can call the unit and asked to speak with the hospitalist on call if the hospitalist that took care of you is not available. Once you are discharged, your primary care physician will handle any further medical issues. Please note that NO REFILLS for any discharge medications will be authorized once you are discharged, as it is imperative that you return to your primary care physician (or establish a relationship with a primary care physician if you do not have one) for your aftercare needs  so that they can reassess your  need for medications and monitor your lab values.     Today  Chief Complaint  Patient presents with  . Fever    Pt is feeling fine, ok to get d/ced ROS:  CONSTITUTIONAL: Denies fevers, chills. Denies any fatigue, weakness.  EYES: Denies blurry vision, double vision, eye pain. EARS, NOSE, THROAT: Denies tinnitus, ear pain, hearing loss. RESPIRATORY: Denies cough, wheeze, shortness of breath.  CARDIOVASCULAR: Denies chest pain, palpitations, edema.  GASTROINTESTINAL: Denies nausea, vomiting, diarrhea, abdominal pain. Denies bright red blood per rectum. GENITOURINARY: Denies dysuria, hematuria. ENDOCRINE: Denies nocturia or thyroid problems. HEMATOLOGIC AND LYMPHATIC: Denies easy bruising or bleeding. SKIN: Denies rash or lesion. MUSCULOSKELETAL: Denies pain in neck, back, shoulder, knees, hips or arthritic symptoms.  NEUROLOGIC: Denies paralysis, paresthesias.  PSYCHIATRIC: Denies anxiety or depressive symptoms.   VITAL SIGNS:  Blood pressure (!) 146/72, pulse 65, temperature 97.7 F (36.5 C), temperature source Oral, resp. rate 20, height _0  (1.702 m), weight 97.5 kg (214 lb 15.2 oz), SpO2 97 %.  I/O:   No intake or output data in the 24 hours ending 04/16/18 1038  PHYSICAL EXAMINATION:  GENERAL:  83 y.o.-year-old patient lying in the bed with no acute distress.  EYES: Pupils equal, round, reactive to light and accommodation. No scleral icterus. Extraocular muscles intact.  HEENT: Head atraumatic, normocephalic. Oropharynx and nasopharynx clear.  NECK:  Supple, no jugular venous distention. No thyroid enlargement, no tenderness.  LUNGS: Normal breath sounds bilaterally, no wheezing, rales,rhonchi or crepitation. No use of accessory muscles of respiration.  CARDIOVASCULAR: S1, S2 normal. No murmurs, rubs, or gallops.  ABDOMEN: Soft, non-tender, non-distended. Bowel sounds present. No organomegaly or mass.  EXTREMITIES: No pedal edema, cyanosis, or clubbing.  NEUROLOGIC:  Cranial nerves II through XII are intact. Muscle strength 5/5 in all extremities. Sensation intact. Gait not checked.  PSYCHIATRIC: The patient is alert and oriented x 3.  SKIN: No obvious rash, lesion, or ulcer.   DATA REVIEW:   CBC No results for input(s): WBC, HGB, HCT, PLT in the last 168 hours.  Chemistries  No results for input(s): NA, K, CL, CO2, GLUCOSE, BUN, CREATININE, CALCIUM, MG, AST, ALT, ALKPHOS, BILITOT in the last 168 hours.  Invalid input(s): GFRCGP  Cardiac Enzymes No results for input(s): TROPONINI in the last 168 hours.  Microbiology Results  Results for orders placed or performed during the hospital encounter of 04/03/18  Culture, blood (Routine x 2)     Status: None   Collection Time: 04/03/18  9:49 PM  Result Value Ref Range Status   Specimen Description BLOOD L AC  Final   Special Requests   Final    BOTTLES DRAWN AEROBIC AND ANAEROBIC Blood Culture adequate volume   Culture   Final    NO GROWTH 5 DAYS Performed at [facility], [address] [place], [place] [address]    Report Status 04/08/2018 FINAL  Final  MRSA PCR Screening     Status: None   Collection Time: 04/04/18  4:28 AM  Result Value Ref Range Status   MRSA by PCR NEGATIVE NEGATIVE Final    Comment:        The GeneXpert MRSA Assay (FDA approved for NASAL specimens only), is one component of a comprehensive MRSA colonization surveillance program. It is not intended to diagnose MRSA infection nor to guide or monitor treatment for MRSA infections. Performed at [facility], [address], [place], [place] [address]   Urine  Culture     Status: None   Collection Time: 04/05/18 12:48 PM  Result Value Ref Range Status   Specimen Description   Final    URINE, RANDOM Performed at [facility], [address] [place], [place] [address]    Special Requests   Final    Normal Performed at [facility], [address] [place], [place] [address]     Culture   Final    NO GROWTH Performed at [facility], [place] [address] [place], [place] [address]    Report Status 04/06/2018 FINAL  Final    RADIOLOGY:  No results found.  EKG:   Orders placed or performed during the hospital encounter of 04/03/18  . EKG 12-Lead  . EKG 12-Lead  . EKG 12-Lead  . EKG 12-Lead      Management plans discussed with the patient, family and they are in agreement.  CODE STATUS:     Code Status Orders  (From admission, onward)        Start     Ordered   04/04/18 1557  Do not attempt resuscitation (DNR)  Continuous    Question Answer Comment  In the event of cardiac or respiratory ARREST Do not call a "code blue"   In the event of cardiac or respiratory ARREST Do not perform Intubation, CPR, defibrillation or ACLS   In the event of cardiac or respiratory ARREST Use medication by any route, position, wound care, and other measures to relive pain and suffering. May use oxygen, suction and manual treatment of airway obstruction as needed for comfort.   Comments Code status was addressed with the son (POA, [place]) and he requested DNR.      04/04/18 1557    Code Status History    Date Active Date Inactive Code Status Order ID Comments User Context   04/04/2018 0417 04/04/2018 1557 Full Code [id number]  Harrie Foreman, MD ED   03/27/2018 2303 04/01/2018 2110 Full Code [id number]  Saundra Shelling, MD Inpatient   03/06/2018 0108 03/08/2018 1842 Full Code [id number]  Amelia Jo, MD ED   03/04/2018 2230 03/05/2018 1742 Full Code [id number]  Amelia Jo, MD Inpatient   10/08/2015 2112 10/13/2015 1651 Full Code [id number]  Theodoro Grist, MD Inpatient   09/16/2015 0234 09/19/2015 0236 Full Code [id number]  Fritzi Mandes, MD Inpatient    Advance Directive Documentation     Most Recent Value  Type of Advance Directive  Healthcare Power of Attorney  Pre-existing out of facility DNR order (yellow form or pink MOST form)  -  "MOST" Form in Place?  -       TOTAL TIME TAKING CARE OF THIS PATIENT: 43 minutes.   Note: This dictation was prepared with Dragon dictation along with smaller phrase technology. Any transcriptional errors that result from this process are unintentional.   _0 @  on 04/16/2018 at 10:38 AM  Between 7am to 6pm - Pager - [phone]  After 6pm go to [email or web address] - password [email or web address] [facility]  [facility]  Office  [phone]  CC: Primary care physician; McLean-Scocuzza, Nino Glow, MD

## 2018-04-09 ENCOUNTER — Other Ambulatory Visit: Payer: Self-pay | Admitting: Internal Medicine

## 2018-04-09 ENCOUNTER — Telehealth: Payer: Self-pay | Admitting: Internal Medicine

## 2018-04-09 DIAGNOSIS — I82493 Acute embolism and thrombosis of other specified deep vein of lower extremity, bilateral: Secondary | ICD-10-CM

## 2018-04-09 LAB — PROTIME-INR
INR: 1.4 — ABNORMAL HIGH (ref 0.8–1.2)
Prothrombin Time: 14.4 s — ABNORMAL HIGH (ref 9.1–12.0)

## 2018-04-09 NOTE — Telephone Encounter (Signed)
See note

## 2018-04-09 NOTE — Telephone Encounter (Signed)
95 mg 2x per day or 142 mg 1x per day   TMS

## 2018-04-09 NOTE — Telephone Encounter (Signed)
Copied from [place]. Topic: General - Other >> Apr 09, 2018  3:00 PM Valla Leaver wrote: Reason for CRM: Amy, RN w/, [facility] calling to clarify orders from the hospital stay regarding her levonox injection.

## 2018-04-09 NOTE — Telephone Encounter (Signed)
Unsure of what they are looking for.  Looks like she received the injection in hospital.

## 2018-04-09 NOTE — Telephone Encounter (Signed)
Please advise 

## 2018-04-10 ENCOUNTER — Telehealth: Payer: Self-pay

## 2018-04-10 ENCOUNTER — Inpatient Hospital Stay: Payer: Medicare Other | Admitting: Internal Medicine

## 2018-04-10 NOTE — Telephone Encounter (Signed)
Copied from [place] [id number]. Topic: General - Other >> Apr 10, 2018  9:18 AM Margot Ables wrote: Reason for CRM: Dr. Murvin Natal is no longer at [facility] office. He has been in [place] for a couple of years. We would need to refer her elsewhere if wanting to keep local to the area.

## 2018-04-10 NOTE — Telephone Encounter (Signed)
It is based on weight 1 mg/kg 2x per day or 1.5 mg/kg 1x per day   [place]

## 2018-04-10 NOTE — Telephone Encounter (Signed)
Please refer to [facility] hematology/oncology  Let daughter [name] know sorry to hear this daughter really liked Dr. Murvin Natal   [place]

## 2018-04-10 NOTE — Telephone Encounter (Signed)
Jessica Erickson was informed by hospital that they were doing 100mg  twice aday until INR numbers improve.  Then going back to original dose by Dr. Olivia Mackie.  FYI

## 2018-04-10 NOTE — Telephone Encounter (Signed)
Dr. Olivia Mackie- Please advise where you would like me to send Afifa. Thanks!

## 2018-04-11 ENCOUNTER — Telehealth: Payer: Self-pay

## 2018-04-11 ENCOUNTER — Ambulatory Visit: Payer: Medicare Other | Admitting: Internal Medicine

## 2018-04-11 ENCOUNTER — Encounter: Payer: Self-pay | Admitting: Internal Medicine

## 2018-04-11 VITALS — BP 136/66 | HR 80 | Temp 98.5°F | Ht 67.0 in | Wt 202.8 lb

## 2018-04-11 DIAGNOSIS — G500 Trigeminal neuralgia: Secondary | ICD-10-CM | POA: Diagnosis not present

## 2018-04-11 DIAGNOSIS — E039 Hypothyroidism, unspecified: Secondary | ICD-10-CM

## 2018-04-11 DIAGNOSIS — J189 Pneumonia, unspecified organism: Secondary | ICD-10-CM | POA: Diagnosis not present

## 2018-04-11 DIAGNOSIS — I824Y3 Acute embolism and thrombosis of unspecified deep veins of proximal lower extremity, bilateral: Secondary | ICD-10-CM | POA: Diagnosis not present

## 2018-04-11 DIAGNOSIS — R5383 Other fatigue: Secondary | ICD-10-CM | POA: Diagnosis not present

## 2018-04-11 DIAGNOSIS — E119 Type 2 diabetes mellitus without complications: Secondary | ICD-10-CM

## 2018-04-11 DIAGNOSIS — D649 Anemia, unspecified: Secondary | ICD-10-CM | POA: Diagnosis not present

## 2018-04-11 DIAGNOSIS — S40021D Contusion of right upper arm, subsequent encounter: Secondary | ICD-10-CM | POA: Diagnosis not present

## 2018-04-11 LAB — POCT GLYCOSYLATED HEMOGLOBIN (HGB A1C): Hemoglobin A1C: 5.9

## 2018-04-11 MED ORDER — WARFARIN SODIUM 10 MG PO TABS: ORAL_TABLET | ORAL | 0 refills | Status: DC

## 2018-04-11 MED ORDER — ENOXAPARIN SODIUM 100 MG/ML ~~LOC~~ SOLN: SUBCUTANEOUS | 0 refills | Status: DC

## 2018-04-11 MED ORDER — WARFARIN SODIUM 2 MG PO TABS: 12.0000 mg | ORAL_TABLET | Freq: Every day | ORAL | 0 refills | Status: DC

## 2018-04-11 MED ORDER — FERROUS SULFATE 325 (65 FE) MG PO TABS: 325.0000 mg | ORAL_TABLET | Freq: Every day | ORAL | 3 refills | Status: DC

## 2018-04-11 MED ORDER — WARFARIN SODIUM 10 MG PO TABS: 10.0000 mg | ORAL_TABLET | Freq: Every day | ORAL | 0 refills | Status: DC

## 2018-04-11 MED ORDER — WARFARIN SODIUM 2 MG PO TABS: 2.0000 mg | ORAL_TABLET | Freq: Every day | ORAL | 0 refills | Status: DC

## 2018-04-11 MED ORDER — METFORMIN HCL ER 500 MG PO TB24: 500.0000 mg | ORAL_TABLET | Freq: Every day | ORAL | 3 refills | Status: DC

## 2018-04-11 NOTE — Progress Notes (Addendum)
Chief Complaint  Patient presents with  . Follow-up   HFU with daughter Jacqlyn Larsen  1. Fatigue pt is not sleeping well in [place]. Also reviewed labs and iron is low  2. Hematoma per [place] 8.7 cm right arm size improved but bruising still present and pt reports pain at times  3. C/o trigeminal neuralgia pain today and reports they are not giving her pain medications at the facility encouraged pt to ask and also facility to offer  4. She has lost weight 17 lbs but legs still swollen post DVT + b/l advised rec avoid hose  5. She reports reduced appetite  6. DM 2 they are giving her Novolin R per [facility] but would like her to resume Metformin XL 500 mg qam with food a1C today PCO 5.9  7. Recent hospital admission for fever urine culture neg dx'ed [facility] admitted 5/1-04/08/18 given Aztreonam, vancomycine, cefepime then doxycycline. CXR + 04/04/18  8. DVT b/l recurrent with IVC filter with variable INR likely 2/2 DDI with Tegretol for trigeminal neuralgia. On Coumadin 10 mg qd and lovenox 100 mg bid.  9. Hypothyroidism TSH 9.580 04/03/18 on synthyroid 200 mg qam not sundays per pt has been missing doses in facility or eating food with thyroid medication 10. Hypotension BP improved off Midrodrine since 04/08/18   Review of Systems  Constitutional: Positive for weight loss.  HENT: Negative for hearing loss.   Eyes: Negative for blurred vision.  Respiratory: Negative for shortness of breath.   Cardiovascular: Negative for chest pain.  Gastrointestinal: Negative for abdominal pain.  Musculoskeletal: Negative for falls.  Skin: Negative for rash.  Neurological:       +trigeminal neuralgia  Weakness improved   Psychiatric/Behavioral: Positive for depression. Negative for memory loss.   Past Medical History:  Diagnosis Date  . Acoustic neuroma ([place])   . Allergy   . Asthma   . Bilateral swelling of feet    and legs  . Bladder infection   . CAD (coronary artery disease)   . Cataract   . Change in voice   .  Compression fracture of body of thoracic vertebra (HCC)    T12 09/18/15 MRI s/p fall   . Constipation   . COPD (chronic obstructive pulmonary disease) ([place])    previous CXR with chronic interstitial lung dz   . CVA (cerebral vascular accident) ([place])   . Depression   . Diabetes ([place])    with neuropathy  . Diabetes mellitus, type 2 ([place])   . Diarrhea   . Double vision   . DVT (deep venous thrombosis) ([place])    right leg 10/2015 was on coumadin off as of 2017/2018 ; s/p IVC filter  . Enuresis   . Eye pain, right   . Fall   . Fatty liver    09/15/15 also mildly dilated pancreatitic duct rec MRCP small sub cm cyst hemangioma speeln mild right hydronephrorossi and prox. hydroureter, kidney stones, mild scarring kidneys  . Female stress incontinence   . Flank pain   . GERD (gastroesophageal reflux disease)    with small hiatal hernia   . Hard of hearing   . Heart disease   . History of kidney problems   . Hyperlipidemia    mixed  . Hypertension   . Hypothyroidism, postsurgical   . Impaired mobility and ADLs    uses rolling walker has caretaker 24/7 at home  . Leg edema   . Mixed incontinence urge and stress (female)(female)   . Neuropathy   .  Osteoarthritis    DDD spine   . Osteoporosis with fracture    T12 compression fracture  . Photophobia   . Pulmonary embolism ([place])    10/2015 off coumadin as of 04/2016  . Pulmonary HTN (HCC)    mild pulm HTN, echo 10/09/15 EF 44-03%KVQQV 1 dd, RV systolic pressure increased   . Recurrent UTI   . Sinus pressure   . Skin cancer    BCC jawline and scalp   . Thyroid disease    follows [facility]  . TIA (transient ischemic attack)    MRI 2009/2010 neg stroke   . Trigeminal neuralgia    Dr. Tomi Bamberger s/p gamma knife x 2, on Tegretol since 2011/2012 no increase in dose >200 mg bid rec per family per neurology   . Urinary frequency   . Urinary, incontinence, stress female    Dr Erlene Quan urology    Past Surgical History:  Procedure  Laterality Date  . APPENDECTOMY     as a child, open  . BRAIN SURGERY     schwnnoma removal 1996   . brain tumor surgery    . BREAST SURGERY     breast bx  . CATARACT EXTRACTION    . CHOLECYSTECTOMY    . EYE SURGERY     cataract  . IVC FILTER PLACEMENT ([facility] HX)     Dr. Lucky Cowboy 10/2015   . LAPAROSCOPIC TUBAL LIGATION    . MOHS SURGERY     scalp 04/2014   . PERIPHERAL VASCULAR CATHETERIZATION N/A 10/11/2015   Procedure: IVC Filter Insertion;  Surgeon: Algernon Huxley, MD;  Location: [place] [facility];  Service: Cardiovascular;  Laterality: N/A;  . PERIPHERAL VASCULAR THROMBECTOMY Bilateral 03/29/2018   Procedure: PERIPHERAL VASCULAR THROMBECTOMY;  Surgeon: Algernon Huxley, MD;  Location: [place] [facility] [facility];  Service: Cardiovascular;  Laterality: Bilateral;  . PUBOVAGINAL SLING    . THROAT SURGERY    . THYROID SURGERY     tumor around vocal cords   . TOOTH EXTRACTION     winter 2018   . TOTAL THYROIDECTOMY  1976   Family History  Problem Relation Age of Onset  . Heart disease Mother   . Diabetes Father   . Cancer Daughter        breast ca x 2 s/p mastectomy    Social History   Socioeconomic History  . Marital status: Widowed    Spouse name: Not on file  . Number of children: 5  . Years of education: Not on file  . Highest education level: Not on file  Occupational History  . Occupation: retired  Scientific laboratory technician  . Financial resource strain: Not on file  . Food insecurity:    Worry: Not on file    Inability: Not on file  . Transportation needs:    Medical: Not on file    Non-medical: Not on file  Tobacco Use  . Smoking status: Former Smoker    Types: Cigarettes    Last attempt to quit: 09/20/1995    Years since quitting: 22.5  . Smokeless tobacco: Never Used  . Tobacco comment: quit 1996 smoked 20 years max 8 cig qd   Substance and Sexual Activity  . Alcohol use: No  . Drug use: No  . Sexual activity: Not on file  Lifestyle  . Physical activity:    Days per week:  Not on file    Minutes per session: Not on file  . Stress: Not on file  Relationships  . Social connections:    Talks on phone: Not on file    Gets together: Not on file    Attends religious service: Not on file    Active member of club or organization: Not on file    Attends meetings of clubs or organizations: Not on file    Relationship status: Not on file  . Intimate partner violence:    Fear of current or ex partner: Not on file    Emotionally abused: Not on file    Physically abused: Not on file    Forced sexual activity: Not on file  Other Topics Concern  . Not on file  Social History Narrative  . Not on file   Current Meds  Medication Sig  . acetaminophen (TYLENOL) 325 MG tablet Take 650 mg by mouth every 6 (six) hours as needed.  Marland Kitchen albuterol (PROVENTIL) (2.5 MG/3ML) 0.083% nebulizer solution Take 3 mLs (2.5 mg total) by nebulization 3 (three) times daily.  . Amino Acids-Protein Hydrolys (FEEDING SUPPLEMENT, PRO-STAT SUGAR FREE 64,) LIQD Take 30 mLs by mouth 2 (two) times daily between meals.  Marland Kitchen atorvastatin (LIPITOR) 40 MG tablet Take 40 mg by mouth daily at 6 PM.   . bisacodyl (DULCOLAX) 5 MG EC tablet Take 1 tablet (5 mg total) by mouth daily as needed for moderate constipation.  . budesonide-formoterol (SYMBICORT) 160-4.5 MCG/ACT inhaler Inhale 2 puffs into the lungs 2 (two) times daily.  . Calcium Carbonate-Vitamin D (CALCIUM 600+D) 600-400 MG-UNIT tablet Take 1 tablet by mouth daily.   . carbamazepine (TEGRETOL) 200 MG tablet Take 1 tablet (200 mg total) by mouth 2 (two) times daily.  Marland Kitchen docusate sodium (COLACE) 100 MG capsule Take 1 capsule (100 mg total) by mouth 2 (two) times daily.  Marland Kitchen doxycycline (VIBRA-TABS) 100 MG tablet Take 1 tablet (100 mg total) by mouth every 12 (twelve) hours.  . enoxaparin (LOVENOX) 100 MG/ML injection Inject 1 mL (100 mg total) into the skin every 12 (twelve) hours.  . fluticasone (FLONASE) 50 MCG/ACT nasal spray Place 1 spray into both  nostrils daily.   . furosemide (LASIX) 40 MG tablet Take 1 tablet (40 mg total) by mouth daily.  Marland Kitchen gabapentin (NEURONTIN) 100 MG capsule Take 100 mg by mouth 2 (two) times daily.   Marland Kitchen guaiFENesin (MUCINEX) 600 MG 12 hr tablet Take 600 mg by mouth daily.   Marland Kitchen HYDROcodone-acetaminophen (NORCO/VICODIN) 5-325 MG tablet Take 1 tablet by mouth every 6 (six) hours as needed for moderate pain or severe pain.  Marland Kitchen insulin regular (NOVOLIN R,HUMULIN R) 100 units/mL injection Inject 0-12 Units into the skin 3 (three) times daily before meals. 100 unit/mL; amt: Per Sliding Scale; If Blood Sugar is 176 to 250, give 3 Units.If Blood Sugar is 251 to 325, give 6 Units.If Blood Sugar is 326 to 450, give 10 Units. If Blood Sugar is greater than 450, give 12 Units and recheck in 1 hour. Notify MD/NP if >450 after recheck. Check blood sugar AC & HS  . levothyroxine (SYNTHROID, LEVOTHROID) 200 MCG tablet Take 200 mcg by mouth daily before breakfast. Except on Sunday. Do not take with other medications  . lidocaine (LIDODERM) 5 % Place 1 patch onto the skin daily. Remove & Discard patch within 12 hours or as directed by MD  . metFORMIN (GLUCOPHAGE-XR) 500 MG 24 hr tablet   . Multiple Vitamin (MULTIVITAMIN WITH MINERALS) TABS tablet Take 1 tablet by mouth daily.  . Nutritional Supplements (ENSURE ENLIVE PO) Take 1  Bottle by mouth 2 (two) times daily.  . pantoprazole (PROTONIX) 40 MG tablet Take 1 tablet (40 mg total) by mouth daily.  . polyethylene glycol powder (GLYCOLAX/MIRALAX) powder Take 17 g by mouth daily as needed for mild constipation.   . polyvinyl alcohol (LIQUIFILM TEARS) 1.4 % ophthalmic solution Place 1 drop into both eyes as needed for dry eyes.  Marland Kitchen senna-docusate (SENOKOT-S) 8.6-50 MG tablet Take 1 tablet by mouth at bedtime as needed for mild constipation.  Marland Kitchen tiotropium (SPIRIVA) 18 MCG inhalation capsule Place 1 capsule (18 mcg total) into inhaler and inhale daily.  . traZODone (DESYREL) 50 MG tablet Take 0.5  tablets (25 mg total) by mouth at bedtime as needed for sleep.  Marland Kitchen warfarin (COUMADIN) 10 MG tablet Take 1 tablet (10 mg total) by mouth daily at 6 PM.   Allergies  Allergen Reactions  . Penicillins Shortness Of Breath, Rash and Other (See Comments)    Has patient had a PCN reaction causing immediate rash, facial/tongue/throat swelling, SOB or lightheadedness with hypotension: Yes Has patient had a PCN reaction causing severe rash involving mucus membranes or skin necrosis: No Has patient had a PCN reaction that required hospitalization No Has patient had a PCN reaction occurring within the last 10 years: No If all of the above answers are "NO", then may proceed with Cephalosporin use.  . Sulfa Antibiotics Shortness Of Breath, Rash and Other (See Comments)  . Amitiza [Lubiprostone]     N/v/d  . Aspirin Other (See Comments)    Reaction:  Unknown  Other reaction(s): Bleeding (intolerance) Per patient " causes nose to bleed" Can take 81 mg daily without any complications Other reaction(s): "bloody nose"   . Penicillin G Other (See Comments)    Has patient had a PCN reaction causing immediate rash, facial/tongue/throat swelling, SOB or lightheadedness with hypotension: No Has patient had a PCN reaction causing severe rash involving mucus membranes or skin necrosis: Unknown Has patient had a PCN reaction that required hospitalization: Unknown Has patient had a PCN reaction occurring within the last 10 years: Unknown If all of the above answers are "NO", then may proceed with Cephalosporin use.   Recent Results (from the past 2160 hour(s))  Comprehensive metabolic panel     Status: Abnormal   Collection Time: 03/03/18  4:30 PM  Result Value Ref Range   Sodium 140 135 - 145 mmol/L   Potassium 4.5 3.5 - 5.1 mmol/L   Chloride 104 101 - 111 mmol/L   CO2 26 22 - 32 mmol/L   Glucose, Bld 135 (H) 65 - 99 mg/dL   BUN 20 6 - 20 mg/dL   Creatinine, Ser 1.16 (H) 0.44 - 1.00 mg/dL   Calcium 8.8  (L) 8.9 - 10.3 mg/dL   Total Protein 7.1 6.5 - 8.1 g/dL   Albumin 3.7 3.5 - 5.0 g/dL   AST 26 15 - 41 U/L   ALT 19 14 - 54 U/L   Alkaline Phosphatase 65 38 - 126 U/L   Total Bilirubin 0.4 0.3 - 1.2 mg/dL   GFR calc non Af Amer 40 (L) >60 mL/min   GFR calc Af Amer 47 (L) >60 mL/min    Comment: (NOTE) The eGFR has been calculated using the CKD EPI equation. This calculation has not been validated in all clinical situations. eGFR's persistently <60 mL/min signify possible Chronic Kidney Disease.    Anion gap 10 5 - 15    Comment: Performed at [facility], [address],  [place], [place] [address]  CBC with Differential     Status: None   Collection Time: 03/03/18  4:30 PM  Result Value Ref Range   WBC 7.3 3.6 - 11.0 K/uL   RBC 4.46 3.80 - 5.20 MIL/uL   Hemoglobin 13.8 12.0 - 16.0 g/dL   HCT 42.7 35.0 - 47.0 %   MCV 95.7 80.0 - 100.0 fL   MCH 30.9 26.0 - 34.0 pg   MCHC 32.3 32.0 - 36.0 g/dL   RDW 14.2 11.5 - 14.5 %   Platelets 198 150 - 440 K/uL   Neutrophils Relative % 71 %   Neutro Abs 5.2 1.4 - 6.5 K/uL   Lymphocytes Relative 19 %   Lymphs Abs 1.4 1.0 - 3.6 K/uL   Monocytes Relative 8 %   Monocytes Absolute 0.6 0.2 - 0.9 K/uL   Eosinophils Relative 2 %   Eosinophils Absolute 0.1 0 - 0.7 K/uL   Basophils Relative 0 %   Basophils Absolute 0.0 0 - 0.1 K/uL    Comment: Performed at [facility], [address] [place], [place] [address]  Lipase, blood     Status: None   Collection Time: 03/03/18  4:30 PM  Result Value Ref Range   Lipase 30 11 - 51 U/L    Comment: Performed at [facility], [address] [place], [place] [address]  Urinalysis, Complete w Microscopic     Status: Abnormal   Collection Time: 03/03/18  5:03 PM  Result Value Ref Range   Color, Urine AMBER (A) YELLOW    Comment: BIOCHEMICALS MAY BE AFFECTED BY COLOR   APPearance HAZY (A) CLEAR   Specific Gravity, Urine 1.024 1.005 - 1.030   pH 5.0 5.0 - 8.0   Glucose, UA  NEGATIVE NEGATIVE mg/dL   Hgb urine dipstick NEGATIVE NEGATIVE   Bilirubin Urine NEGATIVE NEGATIVE   Ketones, ur NEGATIVE NEGATIVE mg/dL   Protein, ur 30 (A) NEGATIVE mg/dL   Nitrite NEGATIVE NEGATIVE   Leukocytes, UA NEGATIVE NEGATIVE   RBC / HPF 0-5 0 - 5 RBC/hpf   WBC, UA 0-5 0 - 5 WBC/hpf   Bacteria, UA NONE SEEN NONE SEEN   Squamous Epithelial / LPF 0-5 (A) NONE SEEN    Comment: Performed at [facility], [address] [place], [place] [address]  Carbamazepine level, total     Status: None   Collection Time: 03/03/18  5:40 PM  Result Value Ref Range   Carbamazepine Lvl 6.6 4.0 - 12.0 ug/mL    Comment: Performed at [facility], [address] [place], [place] [address]  Carbamazepine level, total     Status: None   Collection Time: 03/04/18  7:55 PM  Result Value Ref Range   Carbamazepine Lvl 8.4 4.0 - 12.0 ug/mL    Comment: Performed at [facility], [address] [place], [place] [address]  Basic metabolic panel     Status: Abnormal   Collection Time: 03/04/18  7:55 PM  Result Value Ref Range   Sodium 138 135 - 145 mmol/L   Potassium 4.3 3.5 - 5.1 mmol/L   Chloride 107 101 - 111 mmol/L   CO2 26 22 - 32 mmol/L   Glucose, Bld 139 (H) 65 - 99 mg/dL   BUN 20 6 - 20 mg/dL   Creatinine, Ser 1.09 (H) 0.44 - 1.00 mg/dL   Calcium 8.1 (L) 8.9 - 10.3 mg/dL   GFR calc non Af Amer 44 (L) >60 mL/min  GFR calc Af Amer 51 (L) >60 mL/min    Comment: (NOTE) The eGFR has been calculated using the CKD EPI equation. This calculation has not been validated in all clinical situations. eGFR's persistently <60 mL/min signify possible Chronic Kidney Disease.    Anion gap 5 5 - 15    Comment: Performed at [facility], [address] [place], [place] [address]  CBC with Differential     Status: Abnormal   Collection Time: 03/04/18  7:55 PM  Result Value Ref Range   WBC 7.5 3.6 - 11.0 K/uL   RBC 4.07 3.80 - 5.20 MIL/uL   Hemoglobin 12.6 12.0 -  16.0 g/dL   HCT 38.7 35.0 - 47.0 %   MCV 95.0 80.0 - 100.0 fL   MCH 31.0 26.0 - 34.0 pg   MCHC 32.7 32.0 - 36.0 g/dL   RDW 14.2 11.5 - 14.5 %   Platelets 126 (L) 150 - 440 K/uL   Neutrophils Relative % 74 %   Neutro Abs 5.5 1.4 - 6.5 K/uL   Lymphocytes Relative 15 %   Lymphs Abs 1.1 1.0 - 3.6 K/uL   Monocytes Relative 10 %   Monocytes Absolute 0.7 0.2 - 0.9 K/uL   Eosinophils Relative 1 %   Eosinophils Absolute 0.1 0 - 0.7 K/uL   Basophils Relative 0 %   Basophils Absolute 0.0 0 - 0.1 K/uL    Comment: Performed at [facility], [address] [place], [place] [address]  Troponin I     Status: None   Collection Time: 03/04/18  7:55 PM  Result Value Ref Range   Troponin I <0.03 <0.03 ng/mL    Comment: Performed at [facility], [address] [place], [place] [address]  Troponin I     Status: None   Collection Time: 03/05/18  1:27 AM  Result Value Ref Range   Troponin I <0.03 <0.03 ng/mL    Comment: Performed at [facility], [address] [place], [place] [address]  Basic metabolic panel     Status: Abnormal   Collection Time: 03/05/18  1:27 AM  Result Value Ref Range   Sodium 139 135 - 145 mmol/L   Potassium 4.6 3.5 - 5.1 mmol/L   Chloride 108 101 - 111 mmol/L   CO2 25 22 - 32 mmol/L   Glucose, Bld 136 (H) 65 - 99 mg/dL   BUN 20 6 - 20 mg/dL   Creatinine, Ser 0.96 0.44 - 1.00 mg/dL   Calcium 8.3 (L) 8.9 - 10.3 mg/dL   GFR calc non Af Amer 51 (L) >60 mL/min   GFR calc Af Amer 59 (L) >60 mL/min    Comment: (NOTE) The eGFR has been calculated using the CKD EPI equation. This calculation has not been validated in all clinical situations. eGFR's persistently <60 mL/min signify possible Chronic Kidney Disease.    Anion gap 6 5 - 15    Comment: Performed at [facility], [address] [place], [place] [address]  CBC     Status: Abnormal   Collection Time: 03/05/18  1:27 AM  Result Value Ref Range   WBC 8.3 3.6 - 11.0 K/uL    RBC 4.15 3.80 - 5.20 MIL/uL   Hemoglobin 12.8 12.0 - 16.0 g/dL   HCT 39.2 35.0 - 47.0 %   MCV 94.5 80.0 - 100.0 fL   MCH 30.8 26.0 - 34.0 pg   MCHC 32.6 32.0 - 36.0 g/dL   RDW 14.3 11.5 -  14.5 %   Platelets 127 (L) 150 - 440 K/uL    Comment: Performed at [facility], [address] [place], [place] [address]  Glucose, capillary     Status: Abnormal   Collection Time: 03/05/18  7:28 AM  Result Value Ref Range   Glucose-Capillary 126 (H) 65 - 99 mg/dL  CBC with Differential     Status: Abnormal   Collection Time: 03/05/18  8:30 PM  Result Value Ref Range   WBC 8.5 3.6 - 11.0 K/uL   RBC 4.27 3.80 - 5.20 MIL/uL   Hemoglobin 13.3 12.0 - 16.0 g/dL   HCT 40.5 35.0 - 47.0 %   MCV 94.8 80.0 - 100.0 fL   MCH 31.1 26.0 - 34.0 pg   MCHC 32.8 32.0 - 36.0 g/dL   RDW 14.4 11.5 - 14.5 %   Platelets 108 (L) 150 - 440 K/uL   Neutrophils Relative % 77 %   Neutro Abs 6.6 (H) 1.4 - 6.5 K/uL   Lymphocytes Relative 12 %   Lymphs Abs 1.0 1.0 - 3.6 K/uL   Monocytes Relative 10 %   Monocytes Absolute 0.9 0.2 - 0.9 K/uL   Eosinophils Relative 1 %   Eosinophils Absolute 0.0 0 - 0.7 K/uL   Basophils Relative 0 %   Basophils Absolute 0.0 0 - 0.1 K/uL    Comment: Performed at [facility], [address] [place], [place] [address]  Basic metabolic panel     Status: Abnormal   Collection Time: 03/05/18  8:30 PM  Result Value Ref Range   Sodium 137 135 - 145 mmol/L   Potassium 4.3 3.5 - 5.1 mmol/L   Chloride 107 101 - 111 mmol/L   CO2 23 22 - 32 mmol/L   Glucose, Bld 175 (H) 65 - 99 mg/dL   BUN 23 (H) 6 - 20 mg/dL   Creatinine, Ser 1.14 (H) 0.44 - 1.00 mg/dL   Calcium 7.8 (L) 8.9 - 10.3 mg/dL   GFR calc non Af Amer 41 (L) >60 mL/min   GFR calc Af Amer 48 (L) >60 mL/min    Comment: (NOTE) The eGFR has been calculated using the CKD EPI equation. This calculation has not been validated in all clinical situations. eGFR's persistently <60 mL/min signify possible Chronic  Kidney Disease.    Anion gap 7 5 - 15    Comment: Performed at [facility], [address] [place], [place] [address]  Troponin I     Status: None   Collection Time: 03/05/18  8:30 PM  Result Value Ref Range   Troponin I <0.03 <0.03 ng/mL    Comment: Performed at [facility], [address] [place], [place] [address]  Basic metabolic panel     Status: Abnormal   Collection Time: 03/06/18  3:58 AM  Result Value Ref Range   Sodium 138 135 - 145 mmol/L   Potassium 4.3 3.5 - 5.1 mmol/L   Chloride 109 101 - 111 mmol/L   CO2 23 22 - 32 mmol/L   Glucose, Bld 125 (H) 65 - 99 mg/dL   BUN 20 6 - 20 mg/dL   Creatinine, Ser 0.94 0.44 - 1.00 mg/dL   Calcium 8.0 (L) 8.9 - 10.3 mg/dL   GFR calc non Af Amer 52 (L) >60 mL/min   GFR calc Af Amer >60 >60 mL/min    Comment: (NOTE) The eGFR has been calculated using the CKD EPI equation. This calculation has not been validated in all  clinical situations. eGFR's persistently <60 mL/min signify possible Chronic Kidney Disease.    Anion gap 6 5 - 15    Comment: Performed at [facility], [address] [place], [place] [address]  CBC     Status: Abnormal   Collection Time: 03/06/18  3:58 AM  Result Value Ref Range   WBC 7.6 3.6 - 11.0 K/uL   RBC 3.85 3.80 - 5.20 MIL/uL   Hemoglobin 11.8 (L) 12.0 - 16.0 g/dL   HCT 36.3 35.0 - 47.0 %   MCV 94.3 80.0 - 100.0 fL   MCH 30.6 26.0 - 34.0 pg   MCHC 32.4 32.0 - 36.0 g/dL   RDW 14.1 11.5 - 14.5 %   Platelets 101 (L) 150 - 440 K/uL    Comment: Performed at [facility], [address] [place], [place] [address]  Glucose, capillary     Status: Abnormal   Collection Time: 03/06/18  8:07 AM  Result Value Ref Range   Glucose-Capillary 121 (H) 65 - 99 mg/dL   Comment 1 Notify RN   Glucose, capillary     Status: Abnormal   Collection Time: 03/06/18 11:49 AM  Result Value Ref Range   Glucose-Capillary 146 (H) 65 - 99 mg/dL   Comment 1 Notify RN   Glucose,  capillary     Status: Abnormal   Collection Time: 03/06/18  4:40 PM  Result Value Ref Range   Glucose-Capillary 117 (H) 65 - 99 mg/dL   Comment 1 Notify RN   Glucose, capillary     Status: Abnormal   Collection Time: 03/06/18  8:32 PM  Result Value Ref Range   Glucose-Capillary 138 (H) 65 - 99 mg/dL  Glucose, capillary     Status: Abnormal   Collection Time: 03/07/18  8:07 AM  Result Value Ref Range   Glucose-Capillary 126 (H) 65 - 99 mg/dL   Comment 1 Notify RN   Glucose, capillary     Status: Abnormal   Collection Time: 03/07/18 11:38 AM  Result Value Ref Range   Glucose-Capillary 128 (H) 65 - 99 mg/dL   Comment 1 Notify RN   Glucose, capillary     Status: Abnormal   Collection Time: 03/07/18  5:14 PM  Result Value Ref Range   Glucose-Capillary 135 (H) 65 - 99 mg/dL   Comment 1 Notify RN   Glucose, capillary     Status: Abnormal   Collection Time: 03/07/18  9:25 PM  Result Value Ref Range   Glucose-Capillary 137 (H) 65 - 99 mg/dL   Comment 1 Notify RN    Comment 2 Document in Chart   Glucose, capillary     Status: Abnormal   Collection Time: 03/08/18  8:07 AM  Result Value Ref Range   Glucose-Capillary 128 (H) 65 - 99 mg/dL   Comment 1 Notify RN   Glucose, capillary     Status: Abnormal   Collection Time: 03/08/18 12:16 PM  Result Value Ref Range   Glucose-Capillary 137 (H) 65 - 99 mg/dL   Comment 1 Notify RN   CBC with Differential/Platelet     Status: Abnormal   Collection Time: 03/15/18  5:28 AM  Result Value Ref Range   WBC 8.2 3.6 - 11.0 K/uL   RBC 3.39 (L) 3.80 - 5.20 MIL/uL   Hemoglobin 10.8 (L) 12.0 - 16.0 g/dL   HCT 32.2 (L) 35.0 - 47.0 %   MCV 95.0 80.0 - 100.0 fL   MCH 31.9 26.0 - 34.0 pg  MCHC 33.6 32.0 - 36.0 g/dL   RDW 13.8 11.5 - 14.5 %   Platelets 225 150 - 440 K/uL   Neutrophils Relative % 70 %   Lymphocytes Relative 15 %   Monocytes Relative 11 %   Eosinophils Relative 3 %   Basophils Relative 1 %   Neutro Abs 5.8 1.4 - 6.5 K/uL   Lymphs  Abs 1.2 1.0 - 3.6 K/uL   Monocytes Absolute 0.9 0.2 - 0.9 K/uL   Eosinophils Absolute 0.2 0 - 0.7 K/uL   Basophils Absolute 0.1 0 - 0.1 K/uL   RBC Morphology MIXED RBC POPULATION     Comment: POLYCHROMASIA PRESENT   WBC Morphology MILD LEFT SHIFT (1-5% METAS, OCC MYELO, OCC BANDS)    Smear Review      PLATELET CLUMPS NOTED ON SMEAR, COUNT APPEARS ADEQUATE    Comment: Performed at [facility], [address] [place], [place] [address]  Comprehensive metabolic panel     Status: Abnormal   Collection Time: 03/15/18  5:28 AM  Result Value Ref Range   Sodium 137 135 - 145 mmol/L   Potassium 4.5 3.5 - 5.1 mmol/L   Chloride 102 101 - 111 mmol/L   CO2 27 22 - 32 mmol/L   Glucose, Bld 105 (H) 65 - 99 mg/dL   BUN 14 6 - 20 mg/dL   Creatinine, Ser 1.03 (H) 0.44 - 1.00 mg/dL   Calcium 8.4 (L) 8.9 - 10.3 mg/dL   Total Protein 6.1 (L) 6.5 - 8.1 g/dL   Albumin 2.5 (L) 3.5 - 5.0 g/dL   AST 29 15 - 41 U/L   ALT 29 14 - 54 U/L   Alkaline Phosphatase 78 38 - 126 U/L   Total Bilirubin 0.4 0.3 - 1.2 mg/dL   GFR calc non Af Amer 47 (L) >60 mL/min   GFR calc Af Amer 54 (L) >60 mL/min    Comment: (NOTE) The eGFR has been calculated using the CKD EPI equation. This calculation has not been validated in all clinical situations. eGFR's persistently <60 mL/min signify possible Chronic Kidney Disease.    Anion gap 8 5 - 15    Comment: Performed at [facility], [address] [place], [place] [address]  Lipid panel     Status: None   Collection Time: 03/15/18  5:28 AM  Result Value Ref Range   Cholesterol 144 0 - 200 mg/dL   Triglycerides 83 <150 mg/dL   HDL 41 >40 mg/dL   Total CHOL/HDL Ratio 3.5 RATIO   VLDL 17 0 - 40 mg/dL   LDL Cholesterol 86 0 - 99 mg/dL    Comment:        Total Cholesterol/HDL:CHD Risk Coronary Heart Disease Risk Table                     Men   Women  1/2 Average Risk   3.4   3.3  Average Risk       5.0   4.4  2 X Average Risk   9.6   7.1  3 X  Average Risk  23.4   11.0        Use the calculated Patient Ratio above and the CHD Risk Table to determine the patient's CHD Risk.        ATP III CLASSIFICATION (LDL):  <100     mg/dL   Optimal  100-129  mg/dL   Near or Above  Optimal  130-159  mg/dL   Borderline  160-189  mg/dL   High  >190     mg/dL   Very High Performed at [facility], [address] [place], [place] [address]   Magnesium     Status: None   Collection Time: 03/15/18  5:28 AM  Result Value Ref Range   Magnesium 1.8 1.7 - 2.4 mg/dL    Comment: Performed at [facility], [address] [place], [place] [address]  TSH     Status: None   Collection Time: 03/15/18  5:28 AM  Result Value Ref Range   TSH 3.333 0.350 - 4.500 uIU/mL    Comment: Performed by a 3rd Generation assay with a functional sensitivity of <=0.01 uIU/mL. Performed at [facility], [address] [place], [place] [address]   Vitamin B12     Status: None   Collection Time: 03/15/18  5:28 AM  Result Value Ref Range   Vitamin B-12 716 180 - 914 pg/mL    Comment: (NOTE) This assay is not validated for testing neonatal or myeloproliferative syndrome specimens for Vitamin B12 levels. Performed at [facility], [place] [address], [place], [place] [address]   VITAMIN D 25 Hydroxy (Vit-D Deficiency, Fractures)     Status: None   Collection Time: 03/15/18  5:28 AM  Result Value Ref Range   Vit D, 25-Hydroxy 48.1 30.0 - 100.0 ng/mL    Comment: (NOTE) Vitamin D deficiency has been defined by the [place] practice guideline as a level of serum 25-OH vitamin D less than 20 ng/mL (1,2). The Endocrine Society went on to further define vitamin D insufficiency as a level between 21 and 29 ng/mL (2). 1. IOM (Institute of Medicine). 2010. Dietary reference   intakes for calcium and D. [place]: The   [employer]. 2. Holick MF, Binkley [place],  Bischoff-Ferrari HA, et al.   Evaluation, treatment, and prevention of vitamin D   deficiency: an Endocrine Society clinical practice   guideline. JCEM. 2011 Jul; 96(7):1911-30. Performed At: [facility] [place], [place] [id number] Rush Farmer MD [id number] Performed at [facility], [address] [place], [place] [address]   CBC     Status: Abnormal   Collection Time: 03/27/18  6:15 PM  Result Value Ref Range   WBC 8.2 3.6 - 11.0 K/uL   RBC 3.80 3.80 - 5.20 MIL/uL   Hemoglobin 12.3 12.0 - 16.0 g/dL   HCT 35.8 35.0 - 47.0 %   MCV 94.2 80.0 - 100.0 fL   MCH 32.5 26.0 - 34.0 pg   MCHC 34.5 32.0 - 36.0 g/dL   RDW 15.1 (H) 11.5 - 14.5 %   Platelets 196 150 - 440 K/uL    Comment: Performed at [facility], [address] [place], [place] [address]  Comprehensive metabolic panel     Status: Abnormal   Collection Time: 03/27/18  6:15 PM  Result Value Ref Range   Sodium 139 135 - 145 mmol/L   Potassium 4.0 3.5 - 5.1 mmol/L   Chloride 100 (L) 101 - 111 mmol/L   CO2 32 22 - 32 mmol/L   Glucose, Bld 113 (H) 65 - 99 mg/dL   BUN 20 6 - 20 mg/dL   Creatinine, Ser 1.15 (H) 0.44 - 1.00 mg/dL   Calcium 8.5 (L) 8.9 - 10.3 mg/dL   Total Protein 6.9 6.5 - 8.1 g/dL  Albumin 3.4 (L) 3.5 - 5.0 g/dL   AST 25 15 - 41 U/L   ALT 13 (L) 14 - 54 U/L   Alkaline Phosphatase 81 38 - 126 U/L   Total Bilirubin 0.3 0.3 - 1.2 mg/dL   GFR calc non Af Amer 41 (L) >60 mL/min   GFR calc Af Amer 47 (L) >60 mL/min    Comment: (NOTE) The eGFR has been calculated using the CKD EPI equation. This calculation has not been validated in all clinical situations. eGFR's persistently <60 mL/min signify possible Chronic Kidney Disease.    Anion gap 7 5 - 15    Comment: Performed at [facility], [address] [place], [place] [address]  Troponin I     Status: None   Collection Time: 03/27/18  6:15 PM  Result Value Ref Range   Troponin I <0.03 <0.03  ng/mL    Comment: Performed at [facility], [address] [place], [place] [address]  Protime-INR     Status: None   Collection Time: 03/27/18  6:15 PM  Result Value Ref Range   Prothrombin Time 13.3 11.4 - 15.2 seconds   INR 1.02     Comment: Performed at [facility], [address] [place], [place] [address]  APTT     Status: None   Collection Time: 03/27/18  6:15 PM  Result Value Ref Range   aPTT 27 24 - 36 seconds    Comment: Performed at [facility], [address] [place], [place] [address]  Brain natriuretic peptide     Status: None   Collection Time: 03/27/18  8:21 PM  Result Value Ref Range   B Natriuretic Peptide 49.0 0.0 - 100.0 pg/mL    Comment: Performed at [facility], [address] [place], [place] [address]  Glucose, capillary     Status: None   Collection Time: 03/27/18  9:58 PM  Result Value Ref Range   Glucose-Capillary 91 65 - 99 mg/dL  Heparin level (unfractionated)     Status: Abnormal   Collection Time: 03/28/18  4:14 AM  Result Value Ref Range   Heparin Unfractionated 1.28 (H) 0.30 - 0.70 IU/mL    Comment:        IF HEPARIN RESULTS ARE BELOW EXPECTED VALUES, AND PATIENT DOSAGE HAS BEEN CONFIRMED, SUGGEST FOLLOW UP TESTING OF ANTITHROMBIN III LEVELS. Performed at [facility], [address] [place], [place] [address]   Basic metabolic panel     Status: Abnormal   Collection Time: 03/28/18  4:14 AM  Result Value Ref Range   Sodium 139 135 - 145 mmol/L   Potassium 3.2 (L) 3.5 - 5.1 mmol/L   Chloride 102 101 - 111 mmol/L   CO2 30 22 - 32 mmol/L   Glucose, Bld 104 (H) 65 - 99 mg/dL   BUN 19 6 - 20 mg/dL   Creatinine, Ser 1.04 (H) 0.44 - 1.00 mg/dL   Calcium 8.0 (L) 8.9 - 10.3 mg/dL   GFR calc non Af Amer 46 (L) >60 mL/min   GFR calc Af Amer 54 (L) >60 mL/min    Comment: (NOTE) The eGFR has been calculated using the CKD EPI equation. This calculation has not been validated in all clinical  situations. eGFR's persistently <60 mL/min signify possible Chronic Kidney Disease.    Anion gap 7 5 - 15    Comment: Performed at [facility], [address] [place], [place] [address]  CBC  Status: Abnormal   Collection Time: 03/28/18  4:14 AM  Result Value Ref Range   WBC 6.8 3.6 - 11.0 K/uL   RBC 3.57 (L) 3.80 - 5.20 MIL/uL   Hemoglobin 11.3 (L) 12.0 - 16.0 g/dL   HCT 33.4 (L) 35.0 - 47.0 %   MCV 93.6 80.0 - 100.0 fL   MCH 31.7 26.0 - 34.0 pg   MCHC 33.8 32.0 - 36.0 g/dL   RDW 15.0 (H) 11.5 - 14.5 %   Platelets 178 150 - 440 K/uL    Comment: Performed at [facility], [address] [place], [place] [address]  Glucose, capillary     Status: Abnormal   Collection Time: 03/28/18  7:49 AM  Result Value Ref Range   Glucose-Capillary 101 (H) 65 - 99 mg/dL   Comment 1 Notify RN   Glucose, capillary     Status: None   Collection Time: 03/28/18 11:42 AM  Result Value Ref Range   Glucose-Capillary 90 65 - 99 mg/dL   Comment 1 Notify RN   Heparin level (unfractionated)     Status: Abnormal   Collection Time: 03/28/18  3:08 PM  Result Value Ref Range   Heparin Unfractionated 0.94 (H) 0.30 - 0.70 IU/mL    Comment:        IF HEPARIN RESULTS ARE BELOW EXPECTED VALUES, AND PATIENT DOSAGE HAS BEEN CONFIRMED, SUGGEST FOLLOW UP TESTING OF ANTITHROMBIN III LEVELS. Performed at [facility], [address] [place], [place] [address]   Glucose, capillary     Status: None   Collection Time: 03/28/18  4:55 PM  Result Value Ref Range   Glucose-Capillary 96 65 - 99 mg/dL   Comment 1 Notify RN   Glucose, capillary     Status: Abnormal   Collection Time: 03/28/18  9:20 PM  Result Value Ref Range   Glucose-Capillary 105 (H) 65 - 99 mg/dL  CBC     Status: Abnormal   Collection Time: 03/29/18  1:18 AM  Result Value Ref Range   WBC 6.4 3.6 - 11.0 K/uL   RBC 3.67 (L) 3.80 - 5.20 MIL/uL   Hemoglobin 11.5 (L) 12.0 - 16.0 g/dL   HCT 34.2 (L) 35.0 - 47.0 %    MCV 93.0 80.0 - 100.0 fL   MCH 31.4 26.0 - 34.0 pg   MCHC 33.8 32.0 - 36.0 g/dL   RDW 15.0 (H) 11.5 - 14.5 %   Platelets 187 150 - 440 K/uL    Comment: Performed at [facility], [address] [place], [place] [address]  Heparin level (unfractionated)     Status: Abnormal   Collection Time: 03/29/18  1:18 AM  Result Value Ref Range   Heparin Unfractionated 0.73 (H) 0.30 - 0.70 IU/mL    Comment:        IF HEPARIN RESULTS ARE BELOW EXPECTED VALUES, AND PATIENT DOSAGE HAS BEEN CONFIRMED, SUGGEST FOLLOW UP TESTING OF ANTITHROMBIN III LEVELS. Performed at [facility], [place], [place] [address]   Heparin level (unfractionated)     Status: Abnormal   Collection Time: 03/29/18  5:01 AM  Result Value Ref Range   Heparin Unfractionated 0.75 (H) 0.30 - 0.70 IU/mL    Comment:        IF HEPARIN RESULTS ARE BELOW EXPECTED VALUES, AND PATIENT DOSAGE HAS BEEN CONFIRMED, SUGGEST FOLLOW UP TESTING OF ANTITHROMBIN III LEVELS. Performed at [facility], [address], [place], [place] [address]   Glucose, capillary  Status: None   Collection Time: 03/29/18  7:33 AM  Result Value Ref Range   Glucose-Capillary 97 65 - 99 mg/dL  Glucose, capillary     Status: Abnormal   Collection Time: 03/29/18 11:30 AM  Result Value Ref Range   Glucose-Capillary 105 (H) 65 - 99 mg/dL  Glucose, capillary     Status: None   Collection Time: 03/29/18 11:58 AM  Result Value Ref Range   Glucose-Capillary 87 65 - 99 mg/dL  Glucose, capillary     Status: None   Collection Time: 03/29/18  2:24 PM  Result Value Ref Range   Glucose-Capillary 86 65 - 99 mg/dL  Glucose, capillary     Status: None   Collection Time: 03/29/18  4:38 PM  Result Value Ref Range   Glucose-Capillary 77 65 - 99 mg/dL  Glucose, capillary     Status: None   Collection Time: 03/29/18  9:22 PM  Result Value Ref Range   Glucose-Capillary 98 65 - 99 mg/dL  Heparin level (unfractionated)      Status: None   Collection Time: 03/30/18 12:09 AM  Result Value Ref Range   Heparin Unfractionated 0.43 0.30 - 0.70 IU/mL    Comment:        IF HEPARIN RESULTS ARE BELOW EXPECTED VALUES, AND PATIENT DOSAGE HAS BEEN CONFIRMED, SUGGEST FOLLOW UP TESTING OF ANTITHROMBIN III LEVELS. Performed at [facility], [address] [place], [place] [address]   Glucose, capillary     Status: None   Collection Time: 03/30/18  7:24 AM  Result Value Ref Range   Glucose-Capillary 96 65 - 99 mg/dL   Comment 1 Notify RN   CBC     Status: Abnormal   Collection Time: 03/30/18  7:45 AM  Result Value Ref Range   WBC 6.6 3.6 - 11.0 K/uL   RBC 3.27 (L) 3.80 - 5.20 MIL/uL   Hemoglobin 10.3 (L) 12.0 - 16.0 g/dL   HCT 30.7 (L) 35.0 - 47.0 %   MCV 94.0 80.0 - 100.0 fL   MCH 31.4 26.0 - 34.0 pg   MCHC 33.4 32.0 - 36.0 g/dL   RDW 15.0 (H) 11.5 - 14.5 %   Platelets 188 150 - 440 K/uL    Comment: Performed at [facility], [address] [place], [place] [address]  Basic metabolic panel     Status: Abnormal   Collection Time: 03/30/18  7:45 AM  Result Value Ref Range   Sodium 139 135 - 145 mmol/L   Potassium 3.9 3.5 - 5.1 mmol/L   Chloride 104 101 - 111 mmol/L   CO2 29 22 - 32 mmol/L   Glucose, Bld 106 (H) 65 - 99 mg/dL   BUN 16 6 - 20 mg/dL   Creatinine, Ser 1.28 (H) 0.44 - 1.00 mg/dL   Calcium 7.8 (L) 8.9 - 10.3 mg/dL   GFR calc non Af Amer 36 (L) >60 mL/min   GFR calc Af Amer 42 (L) >60 mL/min    Comment: (NOTE) The eGFR has been calculated using the CKD EPI equation. This calculation has not been validated in all clinical situations. eGFR's persistently <60 mL/min signify possible Chronic Kidney Disease.    Anion gap 6 5 - 15    Comment: Performed at [facility], [place], [place] [address]  Heparin level (unfractionated)     Status: None   Collection Time: 03/30/18  7:45 AM  Result Value Ref Range   Heparin Unfractionated 0.44 0.30 -  0.70  IU/mL    Comment:        IF HEPARIN RESULTS ARE BELOW EXPECTED VALUES, AND PATIENT DOSAGE HAS BEEN CONFIRMED, SUGGEST FOLLOW UP TESTING OF ANTITHROMBIN III LEVELS. Performed at [facility], [address] [place], [place] [address]   Glucose, capillary     Status: Abnormal   Collection Time: 03/30/18 11:40 AM  Result Value Ref Range   Glucose-Capillary 126 (H) 65 - 99 mg/dL  Glucose, capillary     Status: Abnormal   Collection Time: 03/30/18  4:46 PM  Result Value Ref Range   Glucose-Capillary 105 (H) 65 - 99 mg/dL   Comment 1 Notify RN   Glucose, capillary     Status: Abnormal   Collection Time: 03/30/18  9:23 PM  Result Value Ref Range   Glucose-Capillary 119 (H) 65 - 99 mg/dL   Comment 1 Notify RN   CBC     Status: Abnormal   Collection Time: 03/31/18  4:58 AM  Result Value Ref Range   WBC 7.0 3.6 - 11.0 K/uL   RBC 3.10 (L) 3.80 - 5.20 MIL/uL   Hemoglobin 9.9 (L) 12.0 - 16.0 g/dL   HCT 29.3 (L) 35.0 - 47.0 %   MCV 94.6 80.0 - 100.0 fL   MCH 31.8 26.0 - 34.0 pg   MCHC 33.7 32.0 - 36.0 g/dL   RDW 14.9 (H) 11.5 - 14.5 %   Platelets 205 150 - 440 K/uL    Comment: Performed at [facility], [address] [place], [place] [address]  Heparin level (unfractionated)     Status: None   Collection Time: 03/31/18  4:58 AM  Result Value Ref Range   Heparin Unfractionated 0.31 0.30 - 0.70 IU/mL    Comment:        IF HEPARIN RESULTS ARE BELOW EXPECTED VALUES, AND PATIENT DOSAGE HAS BEEN CONFIRMED, SUGGEST FOLLOW UP TESTING OF ANTITHROMBIN III LEVELS. Performed at [facility], [address] [place], [place] [address]   Protime-INR     Status: None   Collection Time: 03/31/18  4:58 AM  Result Value Ref Range   Prothrombin Time 13.7 11.4 - 15.2 seconds   INR 1.06     Comment: Performed at [facility], [address] [place], [place] [address]  Glucose, capillary     Status: Abnormal   Collection Time: 03/31/18  7:43 AM  Result  Value Ref Range   Glucose-Capillary 107 (H) 65 - 99 mg/dL   Comment 1 Notify RN   Glucose, capillary     Status: Abnormal   Collection Time: 03/31/18 11:42 AM  Result Value Ref Range   Glucose-Capillary 130 (H) 65 - 99 mg/dL   Comment 1 Notify RN   Glucose, capillary     Status: Abnormal   Collection Time: 03/31/18  4:48 PM  Result Value Ref Range   Glucose-Capillary 125 (H) 65 - 99 mg/dL   Comment 1 Notify RN   Glucose, capillary     Status: Abnormal   Collection Time: 03/31/18 10:09 PM  Result Value Ref Range   Glucose-Capillary 121 (H) 65 - 99 mg/dL   Comment 1 Notify RN   Protime-INR     Status: None   Collection Time: 04/01/18  4:52 AM  Result Value Ref Range   Prothrombin Time 14.3 11.4 - 15.2 seconds   INR 1.12     Comment: Performed at [facility], [address], [place], [place] [address]  Glucose, capillary  Status: Abnormal   Collection Time: 04/01/18  7:50 AM  Result Value Ref Range   Glucose-Capillary 105 (H) 65 - 99 mg/dL   Comment 1 Notify RN   MRSA PCR Screening     Status: None   Collection Time: 04/01/18  8:09 AM  Result Value Ref Range   MRSA by PCR NEGATIVE NEGATIVE    Comment:        The GeneXpert MRSA Assay (FDA approved for NASAL specimens only), is one component of a comprehensive MRSA colonization surveillance program. It is not intended to diagnose MRSA infection nor to guide or monitor treatment for MRSA infections. Performed at [facility], [address] [place], [place] [address]   Glucose, capillary     Status: Abnormal   Collection Time: 04/01/18 11:39 AM  Result Value Ref Range   Glucose-Capillary 135 (H) 65 - 99 mg/dL   Comment 1 Notify RN   Glucose, capillary     Status: None   Collection Time: 04/01/18  5:02 PM  Result Value Ref Range   Glucose-Capillary 94 65 - 99 mg/dL  Comprehensive metabolic panel     Status: Abnormal   Collection Time: 04/03/18  9:49 PM  Result Value Ref Range   Sodium 137 135  - 145 mmol/L   Potassium 3.9 3.5 - 5.1 mmol/L   Chloride 100 (L) 101 - 111 mmol/L   CO2 29 22 - 32 mmol/L   Glucose, Bld 133 (H) 65 - 99 mg/dL   BUN 18 6 - 20 mg/dL   Creatinine, Ser 1.05 (H) 0.44 - 1.00 mg/dL   Calcium 7.9 (L) 8.9 - 10.3 mg/dL   Total Protein 5.8 (L) 6.5 - 8.1 g/dL   Albumin 2.6 (L) 3.5 - 5.0 g/dL   AST 36 15 - 41 U/L   ALT 19 14 - 54 U/L   Alkaline Phosphatase 69 38 - 126 U/L   Total Bilirubin 0.4 0.3 - 1.2 mg/dL   GFR calc non Af Amer 46 (L) >60 mL/min   GFR calc Af Amer 53 (L) >60 mL/min    Comment: (NOTE) The eGFR has been calculated using the CKD EPI equation. This calculation has not been validated in all clinical situations. eGFR's persistently <60 mL/min signify possible Chronic Kidney Disease.    Anion gap 8 5 - 15    Comment: Performed at [facility], [address] [place], [place] [address]  Lactic acid, plasma     Status: None   Collection Time: 04/03/18  9:49 PM  Result Value Ref Range   Lactic Acid, Venous 1.5 0.5 - 1.9 mmol/L    Comment: Performed at [facility], [address] [place], [place] [address]  CBC with Differential     Status: Abnormal   Collection Time: 04/03/18  9:49 PM  Result Value Ref Range   WBC 9.3 3.6 - 11.0 K/uL   RBC 3.28 (L) 3.80 - 5.20 MIL/uL   Hemoglobin 10.5 (L) 12.0 - 16.0 g/dL   HCT 30.9 (L) 35.0 - 47.0 %   MCV 94.4 80.0 - 100.0 fL   MCH 31.9 26.0 - 34.0 pg   MCHC 33.9 32.0 - 36.0 g/dL   RDW 15.5 (H) 11.5 - 14.5 %   Platelets 337 150 - 440 K/uL   Neutrophils Relative % 75 %   Neutro Abs 7.0 (H) 1.4 - 6.5 K/uL   Lymphocytes Relative 14 %   Lymphs Abs 1.3 1.0 - 3.6 K/uL   Monocytes Relative  10 %   Monocytes Absolute 0.9 0.2 - 0.9 K/uL   Eosinophils Relative 1 %   Eosinophils Absolute 0.1 0 - 0.7 K/uL   Basophils Relative 0 %   Basophils Absolute 0.0 0 - 0.1 K/uL    Comment: Performed at [facility], [address] [place], [place] [address]  Culture, blood (Routine x 2)      Status: None   Collection Time: 04/03/18  9:49 PM  Result Value Ref Range   Specimen Description BLOOD L AC    Special Requests      BOTTLES DRAWN AEROBIC AND ANAEROBIC Blood Culture adequate volume   Culture      NO GROWTH 5 DAYS Performed at [facility], [address] [place], [place] [address]    Report Status 04/08/2018 FINAL   Brain natriuretic peptide     Status: None   Collection Time: 04/03/18  9:49 PM  Result Value Ref Range   B Natriuretic Peptide 83.0 0.0 - 100.0 pg/mL    Comment: Performed at [facility], [address] [place], [place] [address]  Troponin I     Status: None   Collection Time: 04/03/18  9:49 PM  Result Value Ref Range   Troponin I <0.03 <0.03 ng/mL    Comment: Performed at [facility], [address] [place], [place] [address]  TSH     Status: Abnormal   Collection Time: 04/03/18  9:49 PM  Result Value Ref Range   TSH 9.580 (H) 0.350 - 4.500 uIU/mL    Comment: Performed by a 3rd Generation assay with a functional sensitivity of <=0.01 uIU/mL. Performed at [facility], [address] [place], [place] [address]   Lactic acid, plasma     Status: None   Collection Time: 04/03/18 11:28 PM  Result Value Ref Range   Lactic Acid, Venous 0.9 0.5 - 1.9 mmol/L    Comment: Performed at [facility] [address] [place], [place] [address]  Protime-INR     Status: Abnormal   Collection Time: 04/03/18 11:28 PM  Result Value Ref Range   Prothrombin Time 18.9 (H) 11.4 - 15.2 seconds   INR 1.60     Comment: Performed at [facility], [address] [place], [place] [address]  Urinalysis, Complete w Microscopic     Status: Abnormal   Collection Time: 04/04/18  2:20 AM  Result Value Ref Range   Color, Urine YELLOW (A) YELLOW   APPearance TURBID (A) CLEAR   Specific Gravity, Urine >1.046 (H) 1.005 - 1.030   pH 6.0 5.0 - 8.0   Glucose, UA NEGATIVE NEGATIVE mg/dL   Hgb urine dipstick  SMALL (A) NEGATIVE   Bilirubin Urine NEGATIVE NEGATIVE   Ketones, ur NEGATIVE NEGATIVE mg/dL   Protein, ur 30 (A) NEGATIVE mg/dL   Nitrite POSITIVE (A) NEGATIVE   Leukocytes, UA LARGE (A) NEGATIVE   RBC / HPF 11-20 0 - 5 RBC/hpf   WBC, UA >50 (H) 0 - 5 WBC/hpf   Bacteria, UA MANY (A) NONE SEEN   Squamous Epithelial / LPF 11-20 0 - 5    Comment: Please note change in reference range.   WBC Clumps PRESENT    Mucus PRESENT    Non Squamous Epithelial 0-5 (A) NONE SEEN    Comment: Performed at [facility], [address] [place],  [address]  MRSA PCR Screening     Status: None   Collection Time: 04/04/18  4:28 AM  Result Value Ref Range   MRSA by PCR NEGATIVE NEGATIVE    Comment:        The GeneXpert MRSA Assay (FDA approved for NASAL specimens only), is one component of a comprehensive MRSA colonization surveillance program. It is not intended to diagnose MRSA infection nor to guide or monitor treatment for MRSA infections. Performed at [facility], [address] [place], [place] [address]   Glucose, capillary     Status: Abnormal   Collection Time: 04/04/18  4:29 AM  Result Value Ref Range   Glucose-Capillary 115 (H) 65 - 99 mg/dL  Procalcitonin - Baseline     Status: None   Collection Time: 04/04/18  5:35 AM  Result Value Ref Range   Procalcitonin <0.10 ng/mL    Comment:        Interpretation: PCT (Procalcitonin) <= 0.5 ng/mL: Systemic infection (sepsis) is not likely. Local bacterial infection is possible. (NOTE)       Sepsis PCT Algorithm           Lower Respiratory Tract                                      Infection PCT Algorithm    ----------------------------     ----------------------------         PCT < 0.25 ng/mL                PCT < 0.10 ng/mL         Strongly encourage             Strongly discourage   discontinuation of antibiotics    initiation of antibiotics    ----------------------------      -----------------------------       PCT 0.25 - 0.50 ng/mL            PCT 0.10 - 0.25 ng/mL               OR       >80% decrease in PCT            Discourage initiation of                                            antibiotics      Encourage discontinuation           of antibiotics    ----------------------------     -----------------------------         PCT >= 0.50 ng/mL              PCT 0.26 - 0.50 ng/mL               AND        <80% decrease in PCT             Encourage initiation of                                             antibiotics       Encourage continuation           of antibiotics    ----------------------------     -----------------------------        PCT >= 0.50  ng/mL                  PCT > 0.50 ng/mL               AND         increase in PCT                  Strongly encourage                                      initiation of antibiotics    Strongly encourage escalation           of antibiotics                                     -----------------------------                                           PCT <= 0.25 ng/mL                                                 OR                                        > 80% decrease in PCT                                     Discontinue / Do not initiate                                             antibiotics Performed at [facility], [address] [place], [place] [address]   Glucose, capillary     Status: None   Collection Time: 04/04/18  7:34 AM  Result Value Ref Range   Glucose-Capillary 84 65 - 99 mg/dL  Glucose, capillary     Status: Abnormal   Collection Time: 04/04/18 11:40 AM  Result Value Ref Range   Glucose-Capillary 127 (H) 65 - 99 mg/dL  Glucose, capillary     Status: Abnormal   Collection Time: 04/04/18  4:24 PM  Result Value Ref Range   Glucose-Capillary 112 (H) 65 - 99 mg/dL  Glucose, capillary     Status: Abnormal   Collection Time: 04/04/18  9:00 PM  Result Value Ref Range    Glucose-Capillary 107 (H) 65 - 99 mg/dL   Comment 1 Notify RN    Comment 2 Document in Chart   Procalcitonin     Status: None   Collection Time: 04/05/18  4:57 AM  Result Value Ref Range   Procalcitonin <0.10 ng/mL    Comment:        Interpretation: PCT (Procalcitonin) <= 0.5 ng/mL: Systemic infection (sepsis) is not likely. Local bacterial infection is possible. (NOTE)       Sepsis  PCT Algorithm           Lower Respiratory Tract                                      Infection PCT Algorithm    ----------------------------     ----------------------------         PCT < 0.25 ng/mL                PCT < 0.10 ng/mL         Strongly encourage             Strongly discourage   discontinuation of antibiotics    initiation of antibiotics    ----------------------------     -----------------------------       PCT 0.25 - 0.50 ng/mL            PCT 0.10 - 0.25 ng/mL               OR       >80% decrease in PCT            Discourage initiation of                                            antibiotics      Encourage discontinuation           of antibiotics    ----------------------------     -----------------------------         PCT >= 0.50 ng/mL              PCT 0.26 - 0.50 ng/mL               AND        <80% decrease in PCT             Encourage initiation of                                             antibiotics       Encourage continuation           of antibiotics    ----------------------------     -----------------------------        PCT >= 0.50 ng/mL                  PCT > 0.50 ng/mL               AND         increase in PCT                  Strongly encourage                                      initiation of antibiotics    Strongly encourage escalation           of antibiotics                                     -----------------------------  PCT <= 0.25 ng/mL                                                 OR                                         > 80% decrease in PCT                                     Discontinue / Do not initiate                                             antibiotics Performed at [facility], [address] [place], [place] [address]   Glucose, capillary     Status: Abnormal   Collection Time: 04/05/18  7:30 AM  Result Value Ref Range   Glucose-Capillary 122 (H) 65 - 99 mg/dL  Glucose, capillary     Status: Abnormal   Collection Time: 04/05/18 11:51 AM  Result Value Ref Range   Glucose-Capillary 117 (H) 65 - 99 mg/dL  Urine Culture     Status: None   Collection Time: 04/05/18 12:48 PM  Result Value Ref Range   Specimen Description      URINE, RANDOM Performed at [facility], [address] [place], [place] [address]    Special Requests      Normal Performed at [facility], [address], [place], [place] [address]    Culture      NO GROWTH Performed at [facility], [place] [address], [place], [place] [address]    Report Status 04/06/2018 FINAL   Glucose, capillary     Status: Abnormal   Collection Time: 04/05/18  4:57 PM  Result Value Ref Range   Glucose-Capillary 109 (H) 65 - 99 mg/dL   Comment 1 Notify RN   Glucose, capillary     Status: Abnormal   Collection Time: 04/05/18  9:16 PM  Result Value Ref Range   Glucose-Capillary 135 (H) 65 - 99 mg/dL  Procalcitonin     Status: None   Collection Time: 04/06/18  5:28 AM  Result Value Ref Range   Procalcitonin <0.10 ng/mL    Comment:        Interpretation: PCT (Procalcitonin) <= 0.5 ng/mL: Systemic infection (sepsis) is not likely. Local bacterial infection is possible. (NOTE)       Sepsis PCT Algorithm           Lower Respiratory Tract                                      Infection PCT Algorithm    ----------------------------     ----------------------------         PCT < 0.25 ng/mL                PCT < 0.10 ng/mL         Strongly encourage  Strongly discourage    discontinuation of antibiotics    initiation of antibiotics    ----------------------------     -----------------------------       PCT 0.25 - 0.50 ng/mL            PCT 0.10 - 0.25 ng/mL               OR       >80% decrease in PCT            Discourage initiation of                                            antibiotics      Encourage discontinuation           of antibiotics    ----------------------------     -----------------------------         PCT >= 0.50 ng/mL              PCT 0.26 - 0.50 ng/mL               AND        <80% decrease in PCT             Encourage initiation of                                             antibiotics       Encourage continuation           of antibiotics    ----------------------------     -----------------------------        PCT >= 0.50 ng/mL                  PCT > 0.50 ng/mL               AND         increase in PCT                  Strongly encourage                                      initiation of antibiotics    Strongly encourage escalation           of antibiotics                                     -----------------------------                                           PCT <= 0.25 ng/mL                                                 OR                                        >  80% decrease in PCT                                     Discontinue / Do not initiate                                             antibiotics Performed at [facility], [address] [place], [place] [address]   CBC     Status: Abnormal   Collection Time: 04/06/18  5:28 AM  Result Value Ref Range   WBC 6.1 3.6 - 11.0 K/uL   RBC 2.79 (L) 3.80 - 5.20 MIL/uL   Hemoglobin 9.4 (L) 12.0 - 16.0 g/dL   HCT 26.2 (L) 35.0 - 47.0 %   MCV 94.0 80.0 - 100.0 fL   MCH 33.5 26.0 - 34.0 pg   MCHC 35.7 32.0 - 36.0 g/dL   RDW 15.8 (H) 11.5 - 14.5 %   Platelets 331 150 - 440 K/uL    Comment: Performed at [facility], [address] [place], [place]  [address]  Basic metabolic panel     Status: Abnormal   Collection Time: 04/06/18  5:28 AM  Result Value Ref Range   Sodium 140 135 - 145 mmol/L   Potassium 3.7 3.5 - 5.1 mmol/L   Chloride 106 101 - 111 mmol/L   CO2 28 22 - 32 mmol/L   Glucose, Bld 119 (H) 65 - 99 mg/dL   BUN 11 6 - 20 mg/dL   Creatinine, Ser 0.85 0.44 - 1.00 mg/dL   Calcium 7.4 (L) 8.9 - 10.3 mg/dL   GFR calc non Af Amer 59 (L) >60 mL/min   GFR calc Af Amer >60 >60 mL/min    Comment: (NOTE) The eGFR has been calculated using the CKD EPI equation. This calculation has not been validated in all clinical situations. eGFR's persistently <60 mL/min signify possible Chronic Kidney Disease.    Anion gap 6 5 - 15    Comment: Performed at [facility], [address] [place], [place] [address]  Protime-INR     Status: Abnormal   Collection Time: 04/06/18  5:28 AM  Result Value Ref Range   Prothrombin Time 16.3 (H) 11.4 - 15.2 seconds   INR 1.32     Comment: Performed at [facility], [address] [place], [place] [address]  Glucose, capillary     Status: Abnormal   Collection Time: 04/06/18  7:48 AM  Result Value Ref Range   Glucose-Capillary 115 (H) 65 - 99 mg/dL   Comment 1 Notify RN   Glucose, capillary     Status: Abnormal   Collection Time: 04/06/18 11:55 AM  Result Value Ref Range   Glucose-Capillary 109 (H) 65 - 99 mg/dL   Comment 1 Notify RN   Glucose, capillary     Status: Abnormal   Collection Time: 04/06/18  4:37 PM  Result Value Ref Range   Glucose-Capillary 129 (H) 65 - 99 mg/dL   Comment 1 Notify RN   Glucose, capillary     Status: Abnormal   Collection Time: 04/06/18  9:20 PM  Result Value Ref Range   Glucose-Capillary 123 (H) 65 - 99 mg/dL  Protime-INR     Status: Abnormal   Collection Time: 04/07/18  4:51 AM  Result Value Ref Range   Prothrombin Time 16.7 (H) 11.4 - 15.2 seconds   INR 1.36     Comment: Performed at [facility], [address]  [place], [place] [address]  Glucose, capillary     Status: Abnormal   Collection Time: 04/07/18  7:49 AM  Result Value Ref Range   Glucose-Capillary 103 (H) 65 - 99 mg/dL   Comment 1 Notify RN   Glucose, capillary     Status: Abnormal   Collection Time: 04/07/18 12:02 PM  Result Value Ref Range   Glucose-Capillary 122 (H) 65 - 99 mg/dL   Comment 1 Notify RN   Glucose, capillary     Status: Abnormal   Collection Time: 04/07/18  4:45 PM  Result Value Ref Range   Glucose-Capillary 118 (H) 65 - 99 mg/dL   Comment 1 Notify RN   Glucose, capillary     Status: None   Collection Time: 04/07/18  9:34 PM  Result Value Ref Range   Glucose-Capillary 90 65 - 99 mg/dL   Comment 1 Notify RN   Protime-INR     Status: Abnormal   Collection Time: 04/08/18  5:01 AM  Result Value Ref Range   Prothrombin Time 18.3 (H) 11.4 - 15.2 seconds   INR 1.53     Comment: Performed at [facility], [address] [place], [place] [address]  Glucose, capillary     Status: Abnormal   Collection Time: 04/08/18  7:51 AM  Result Value Ref Range   Glucose-Capillary 111 (H) 65 - 99 mg/dL  Glucose, capillary     Status: None   Collection Time: 04/08/18 11:50 AM  Result Value Ref Range   Glucose-Capillary 95 65 - 99 mg/dL   Comment 1 Notify RN   Protime-INR     Status: Abnormal   Collection Time: 04/09/18 11:24 AM  Result Value Ref Range   INR 1.4 (H) 0.8 - 1.2    Comment: Reference interval is for non-anticoagulated patients. Suggested INR therapeutic range for Vitamin K antagonist therapy:    Standard Dose (moderate intensity                   therapeutic range):       2.0 - 3.0    Higher intensity therapeutic range       2.5 - 3.5    Prothrombin Time 14.4 (H) 9.1 - 12.0 sec   Objective  Body mass index is 31.76 kg/m. Wt Readings from Last 3 Encounters:  04/11/18 202 lb 12.8 oz (92 kg)  04/08/18 214 lb 15.2 oz (97.5 kg)  03/27/18 207 lb (93.9 kg)   Temp Readings from Last 3 Encounters:   04/11/18 98.5 F (36.9 C) (Oral)  04/08/18 97.7 F (36.5 C) (Oral)  04/01/18 98.2 F (36.8 C) (Oral)   BP Readings from Last 3 Encounters:  04/11/18 136/66  04/08/18 (!) 146/72  04/01/18 (!) 129/56   Pulse Readings from Last 3 Encounters:  04/11/18 80  04/08/18 65  04/01/18 64    Physical Exam  Constitutional: She is oriented to person, place, and time. Vital signs are normal. She appears well-developed and well-nourished. She is cooperative.  HENT:  Head: Normocephalic and atraumatic.  Mouth/Throat: Oropharynx is clear and moist and mucous membranes are normal.  Eyes: Pupils are equal, round, and reactive to light. Conjunctivae are normal.  Cardiovascular: Normal rate, regular rhythm and normal heart sounds.  Pulmonary/Chest: Effort normal and breath sounds normal.  Neurological: She is  alert and oriented to person, place, and time.  In wheelchair today   Skin: Skin is warm, dry and intact.  Right upper ext hematoma improved but still present   Psychiatric: She has a normal mood and affect. Her speech is normal and behavior is normal. Judgment and thought content normal. Cognition and memory are normal.  Nursing note and vitals reviewed.   Assessment   1. Fatigue  2. Anemia  3. Right upper arm hematoma  4. Trigeminal neuralgia  5. Recurrent acute B/l leg edema + DVT with IVC, variable INR likely 2/2 DDI with Tegretol  6. DM 2 A1C 5.9  F/u podiatry last foot exam 12/24/17  F/u [facility]  7. Pneumonia noted CXR 04/04/18  8. Hypothyroidism uncontrolled TSH 0.580 04/03/18  9. Hypotension improved with h/o HTN off Midodrine 04/08/18  10. HM Plan  1.  Increase sleep Take iron 325 mg qd ferrous sulfate   Encourage po intake with preimier protein shakes bid d/c prostat 30 ml bid and replace with premier protein  PT needs to work with patient consistently 2/2 physical deconditioning  2.  See above  Consider check anemia panel in future  3.  Trial heat  4. Tegretol 200  mg bid  Prn Tylenol, norco  [facility] to offer to patient pain meds and assess pain at least q6 hours  5.  Increase coumadin to 12 mg qpm  Reduce lovenox to 92 bid or 138 qd  Avoid compression stockings  Consider echo in future  Lasix 40 mg qd  Refer to H/O [facility] to manage coumadin previous H/o Dr. Murvin Natal moved to [place] Please fax to me copy of INR results check INR qd subtherapeutic last was 1.4 04/09/18, INR 1.6 on 04/11/18   6. D/c novolin R and change back to metformin XL 500 mg qam with food 7.  CXR 6/13-6/27 repeat CXR f/u pneumonia  8.  rec compliance with meds in facility levothyroxine 200 mcg qam on empty stomach no dose on sundays. 9.  Off fosinopril hold and d/c for now  D/c midodrine BP normal has been stopped since 04/08/18  Check BP 2x per day   10.  Fluhad pna 23 vx had 09/24/06 this will need to be updated in future  prevnar had 09/20/16 Tdap 08/04/16 shingrixnot had out of stock called pharmacy,? zostavaxif had  Disc dermatology at f/u  Out of age window pap, colonoscopy, mammo  H/o fall 09/2015 and 05/2016  DEXA 01/01/08 osteopeniawill make sure on ca 600 bid and vit D 1000 iu qd Vitamin d 03/15/18 48.1      Provider: Dr. Olivia Mackie McLean-[facility]ocuzza-Internal Medicine

## 2018-04-11 NOTE — Progress Notes (Signed)
Note has been faxed.

## 2018-04-11 NOTE — Progress Notes (Signed)
Pre visit review using our clinic review tool, if applicable. No additional management support is needed unless otherwise documented below in the visit note. 

## 2018-04-11 NOTE — Patient Instructions (Addendum)
Your A1C was 5.9 today  Please give the notes to [facility]  Please schedule CXR between 6/13-6/27/19 at [facility]  We referred you to [facility] hematology/oncology for coumadin management

## 2018-04-11 NOTE — Telephone Encounter (Signed)
Patient will be informed of information at appointment today.

## 2018-04-11 NOTE — Telephone Encounter (Signed)
-----   Message from Delorise Jackson, MD sent at 04/10/2018  9:52 PM EDT ----- Please call daughter [name]   reason why its probably so hard to control INR or get in therapeutic range is due to tegretol/carabamazepine which may lower coumadin effectiveness   Dr. Murvin Natal is now in [place] not [place]  Referring to H/o at [facility] for Coumadin management   [place]

## 2018-04-12 ENCOUNTER — Telehealth: Payer: Self-pay

## 2018-04-12 ENCOUNTER — Telehealth: Payer: Self-pay | Admitting: Internal Medicine

## 2018-04-12 ENCOUNTER — Other Ambulatory Visit: Payer: Self-pay | Admitting: Internal Medicine

## 2018-04-12 DIAGNOSIS — R112 Nausea with vomiting, unspecified: Secondary | ICD-10-CM

## 2018-04-12 MED ORDER — ONDANSETRON HCL 4 MG PO TABS: 4.0000 mg | ORAL_TABLET | Freq: Two times a day (BID) | ORAL | 0 refills | Status: DC | PRN

## 2018-04-12 NOTE — Telephone Encounter (Signed)
Please advise 

## 2018-04-12 NOTE — Telephone Encounter (Signed)
Amy has been informed. She stated she will also email the staff that plan of care is correct.

## 2018-04-12 NOTE — Telephone Encounter (Signed)
Why do they need to start next week?  Ok to do she needs PT/OT asap   [place]

## 2018-04-12 NOTE — Telephone Encounter (Signed)
Jessica Erickson has been informed. She stated she will also email the staff that plan of care is correct.

## 2018-04-12 NOTE — Telephone Encounter (Signed)
Yes

## 2018-04-12 NOTE — Telephone Encounter (Unsigned)
Copied from [place] [phone]. Topic: Quick Communication - See Telephone Encounter >> Apr 12, 2018  9:36 AM Percell Belt A wrote: CRM for notification. See Telephone encounter for: 04/12/18. Wells Guiles (daughter) called in said that [facility] is need order tot prick pt finger to check her INR?     Fax order to [phone]

## 2018-04-12 NOTE — Telephone Encounter (Signed)
Is it ok to give these orders

## 2018-04-12 NOTE — Telephone Encounter (Unsigned)
Copied from [place] [phone]. Topic: Inquiry >> Apr 12, 2018  9:30 AM Scherrie Gerlach wrote: Reason for CRM: [place] with [facility] needs to get verbal for an extension to start OT until next week. Ok to leave message on confidential VM

## 2018-04-12 NOTE — Telephone Encounter (Unsigned)
Copied from [place] [phone]. Topic: Quick Communication - See Telephone Encounter >> Apr 12, 2018 12:04 PM Neva Seat wrote:  Amy, [place]  Verbal Orders: PT for safety: 2 times a week for 4 weeks 1 time a week 1 week  Please let Amy know if there are any limitations.

## 2018-04-12 NOTE — Telephone Encounter (Signed)
[facility] please fax order check INR q 2 days fax back to me until H/o takes over   [place]

## 2018-04-12 NOTE — Telephone Encounter (Signed)
Ok to do so

## 2018-04-12 NOTE — Telephone Encounter (Signed)
Copied from [place] [phone]. Topic: General - Other >> Apr 12, 2018  8:51 AM Yvette Rack wrote: Reason for CRM: Judeen Hammans from [facility] [phone] calling because pt has had vomiting for 2 days and would like a RX for Zofran please send this to the [place], [place] [phone] (Phone) [phone] (Fax)      Route to department's PEC Pool.

## 2018-04-14 ENCOUNTER — Telehealth: Payer: Self-pay | Admitting: Family Medicine

## 2018-04-14 NOTE — Telephone Encounter (Signed)
Triage nurse called: received home health INR report.  INR 1.5 x 2 days; reports pt was discharged from hospital recently and being treated for DVT.  On coumadin 12.5 daily since discharged and lovenox bridge tx. INR goal 2-3  Verbal order given to increase coumadin 15mg  today, continue lovenox and recheck INR in am (has order for daily INRs); further dosing per PCP or other.

## 2018-04-15 ENCOUNTER — Telehealth: Payer: Self-pay | Admitting: Internal Medicine

## 2018-04-15 ENCOUNTER — Telehealth: Payer: Self-pay

## 2018-04-15 NOTE — Telephone Encounter (Signed)
Call back and have them call me back weds to report   thanks [place]

## 2018-04-15 NOTE — Telephone Encounter (Signed)
FYI. Sent to Dr. Aundra Dubin

## 2018-04-15 NOTE — Telephone Encounter (Signed)
Please advise 

## 2018-04-15 NOTE — Telephone Encounter (Unsigned)
Copied from [place] [phone]. Topic: Inquiry >> Apr 15, 2018  5:32 PM Neva Seat wrote: Rex Kras [phone]  [place]  Still needing to discuss pt's INR (BID times a day Lovonox  100 mg 2 times a day) Please call Joelene Millin back asap to help care for pt.

## 2018-04-15 NOTE — Telephone Encounter (Signed)
Ok   TMS 

## 2018-04-15 NOTE — Telephone Encounter (Signed)
Copied from [place] [phone]. Topic: General - Other >> Apr 15, 2018  3:07 PM Carolyn Stare wrote:  Jessica Erickson called from  [facility] to report pt INR 1.7     Phone number [phone]

## 2018-04-15 NOTE — Telephone Encounter (Signed)
Copied from [place] [phone]. Topic: Quick Communication - See Telephone Encounter >> Apr 15, 2018 11:00 AM Rutherford Nail, NT wrote: CRM for notification. See Telephone encounter for: 04/15/18. Ginger at [facility] needing verbal orders for twice a day nurse visits while patient remains on lovenox. Please advise. CB#: [phone]

## 2018-04-15 NOTE — Telephone Encounter (Signed)
Copied from [place] [phone]. Topic: General - Other >> Apr 15, 2018  3:07 PM Carolyn Stare wrote:  Jessica Erickson called from  [facility] to report pt INR 1.7     Phone number [phone]

## 2018-04-15 NOTE — Telephone Encounter (Signed)
Ok to do

## 2018-04-16 ENCOUNTER — Telehealth: Payer: Self-pay

## 2018-04-16 ENCOUNTER — Telehealth: Payer: Self-pay | Admitting: Internal Medicine

## 2018-04-16 NOTE — Telephone Encounter (Signed)
Please advise 

## 2018-04-16 NOTE — Telephone Encounter (Signed)
FYI

## 2018-04-16 NOTE — Telephone Encounter (Signed)
I did say this because they are no longer giving her insulin   [place]

## 2018-04-16 NOTE — Telephone Encounter (Signed)
Ok

## 2018-04-16 NOTE — Telephone Encounter (Signed)
Ok to do so

## 2018-04-16 NOTE — Telephone Encounter (Signed)
Copied from [place] [phone]. Topic: Quick Communication - See Telephone Encounter >> Apr 16, 2018  9:57 AM Ahmed Prima L wrote: CRM for notification. See Telephone encounter for: 04/16/18.  Patient's daughter [name] ) called and said that [facility] is not checking her sugar anymore because " Dr Aundra Dubin said to no longer do that ". She just wants to make sure this is accurate and that they are following Dr Claris Gladden orders. Please advise. Call back [phone]

## 2018-04-16 NOTE — Telephone Encounter (Signed)
Ok how much coumadin is she taking  Lovenox should be 92 2x per day    [place]

## 2018-04-16 NOTE — Telephone Encounter (Signed)
Copied from [place] [phone]. Topic: Inquiry >> Apr 15, 2018  5:32 PM Neva Seat wrote: Rex Kras [phone]  [place]  Still needing to discuss pt's INR (BID times a day Lovonox  100 mg 2 times a day) Please call Joelene Millin back asap to help care for pt. >> Apr 16, 2018  8:55 AM Oneta Rack wrote: Osvaldo Human name: Phill Mutter  Relation to pt: Home Health from [place]  Call back number: [phone]   Reason for call:  Wanted to inform PCP patient INR check for this morning 1.9

## 2018-04-16 NOTE — Telephone Encounter (Signed)
Copied from [place] [phone]. Topic: Quick Communication - See Telephone Encounter >> Apr 16, 2018  7:52 AM Arletha Grippe wrote: CRM for notification. See Telephone encounter for: 04/16/18. Serene from [facility] called Verbal orders - OT  2 week4  Cb is [phone] Can leave a message

## 2018-04-17 ENCOUNTER — Other Ambulatory Visit: Payer: Self-pay | Admitting: Internal Medicine

## 2018-04-17 ENCOUNTER — Telehealth: Payer: Self-pay | Admitting: Internal Medicine

## 2018-04-17 DIAGNOSIS — I824Y3 Acute embolism and thrombosis of unspecified deep veins of proximal lower extremity, bilateral: Secondary | ICD-10-CM

## 2018-04-17 MED ORDER — WARFARIN SODIUM 10 MG PO TABS: ORAL_TABLET | ORAL | 0 refills | Status: DC

## 2018-04-17 MED ORDER — WARFARIN SODIUM 5 MG PO TABS
ORAL_TABLET | ORAL | 0 refills | Status: DC
Start: 2018-04-17 — End: 2018-04-22

## 2018-04-17 NOTE — Telephone Encounter (Signed)
Copied from [place]. Topic: General - Other >> Apr 17, 2018  8:40 AM Lennox Solders wrote: Reason for CRM beth lpn  from [facility] is calling to report the patient pt/inr is 2.0

## 2018-04-17 NOTE — Telephone Encounter (Signed)
FYI

## 2018-04-17 NOTE — Telephone Encounter (Signed)
VM has been left informing HH.

## 2018-04-17 NOTE — Telephone Encounter (Signed)
Patient is taking 15MG  coumadin and dosage of Lovenox is now 92mg  BID

## 2018-04-17 NOTE — Telephone Encounter (Signed)
Jessica Erickson has been notified.

## 2018-04-18 ENCOUNTER — Telehealth: Payer: Self-pay | Admitting: Internal Medicine

## 2018-04-18 NOTE — Telephone Encounter (Signed)
Copied from [place] [phone]. Topic: Inquiry >> Apr 18, 2018  8:48 AM Lennox Solders wrote: Reason for CRM: beth lpn from [facility] is calling to report patient PT/INR 1.9

## 2018-04-18 NOTE — Telephone Encounter (Signed)
FYI

## 2018-04-19 ENCOUNTER — Telehealth: Payer: Self-pay | Admitting: Internal Medicine

## 2018-04-19 NOTE — Telephone Encounter (Signed)
Copied from [place] [phone]. Topic: Quick Communication - See Telephone Encounter >> Apr 19, 2018  8:51 AM Bea Graff, NT wrote: CRM for notification. See Telephone encounter for: 04/19/18. Ginger with [facility] calling with pts INR results. INR is 1.8. CB#: [phone]

## 2018-04-19 NOTE — Telephone Encounter (Signed)
Increase to coumadin 16 mg daily   TMS

## 2018-04-19 NOTE — Telephone Encounter (Signed)
FYI

## 2018-04-19 NOTE — Telephone Encounter (Signed)
Called and notified Ginger with [facility] per Dr. Aundra Dubin to increase Oretha Caprice coumadin to 16mg  daily. Ginger requested that be faxed to facility and I faxed to [phone]

## 2018-04-22 ENCOUNTER — Telehealth: Payer: Self-pay

## 2018-04-22 ENCOUNTER — Telehealth: Payer: Self-pay | Admitting: Internal Medicine

## 2018-04-22 ENCOUNTER — Other Ambulatory Visit: Payer: Self-pay | Admitting: Internal Medicine

## 2018-04-22 DIAGNOSIS — I824Y3 Acute embolism and thrombosis of unspecified deep veins of proximal lower extremity, bilateral: Secondary | ICD-10-CM

## 2018-04-22 MED ORDER — WARFARIN SODIUM 10 MG PO TABS
ORAL_TABLET | ORAL | 0 refills | Status: DC
Start: 2018-04-22 — End: 2018-04-27

## 2018-04-22 MED ORDER — WARFARIN SODIUM 3 MG PO TABS: ORAL_TABLET | ORAL | 0 refills | Status: DC

## 2018-04-22 NOTE — Telephone Encounter (Signed)
FYI. I sent patients INR to Dr. Aundra Dubin this morning.

## 2018-04-22 NOTE — Telephone Encounter (Signed)
Copied from [place] [phone]. Topic: Quick Communication - See Telephone Encounter >> Apr 22, 2018 10:22 AM Synthia Innocent wrote: CRM for notification. See Telephone encounter for: 04/22/18. [facility] calling with INR results, today was 1.5. Need order faxed for coumadin to [phone]

## 2018-04-22 NOTE — Telephone Encounter (Signed)
Increase Coumadin to 19 mg daily  Call back    [place]

## 2018-04-22 NOTE — Telephone Encounter (Signed)
Beth from [facility] called and states that Mrs. Kontos INR today is 1.5.

## 2018-04-23 ENCOUNTER — Telehealth: Payer: Self-pay | Admitting: Internal Medicine

## 2018-04-23 NOTE — Telephone Encounter (Signed)
Called [name] from [facility] and informed her that Dr. Aundra Dubin would like for Jessica Erickson to take 19mg  of coumadin daily and for PT/INR to be checked every 48 hours.Will fax orders to [facility].

## 2018-04-23 NOTE — Telephone Encounter (Signed)
written  Order faxed as directed in verbal.

## 2018-04-23 NOTE — Telephone Encounter (Signed)
Every 48 hours  Increase coumadin to 19 mg daily   Home  Melissa  Check on hematology/oncology referral   Thanks [place]

## 2018-04-23 NOTE — Telephone Encounter (Signed)
Copied from [place] [phone]. Topic: Quick Communication - See Telephone Encounter >> Apr 23, 2018  8:40 AM [place] Rieger wrote: CRM for notification. See Telephone encounter for: 04/23/18.  [name] from [facility] home [facility] called and wanted to let Dr Aundra Dubin know her Jessica Erickson was checked this morning and it was 1.7. She also wants to know when her next coumadin check will be? She is wanting the nurse to call her [phone].

## 2018-04-23 NOTE — Telephone Encounter (Signed)
Called [place] and notified Forde Radon of patient new coumadin dosage as well as faxed order to [phone].

## 2018-04-23 NOTE — Telephone Encounter (Signed)
Please advise 

## 2018-04-24 ENCOUNTER — Telehealth: Payer: Self-pay | Admitting: Internal Medicine

## 2018-04-24 NOTE — Telephone Encounter (Signed)
Copied from [place] [phone]. Topic: Quick Communication - See Telephone Encounter >> Apr 24, 2018  8:36 AM Robina Ade, Helene Kelp D wrote: CRM for notification. See Telephone encounter for: 04/24/18. Beth with [facility] called to report a INR reading for patient this morning and it is 1.9. If there are any questions she can be reached at [phone].

## 2018-04-24 NOTE — Telephone Encounter (Signed)
Tried to call Jessica Erickson but VM is full and she did not pick phone call.  She needs to be informed of to check patients INR every 48 Hours.

## 2018-04-24 NOTE — Telephone Encounter (Signed)
Per Dr Claris Gladden note from yesterday she states she wants it checked every 48 hours.  Please call Beth at [facility] and let her know this.

## 2018-04-24 NOTE — Telephone Encounter (Signed)
Called and notified Jessica Erickson at [facility] Dr. Aundra Dubin wants them to continue to monitor Jessica Erickson INR.

## 2018-04-24 NOTE — Telephone Encounter (Signed)
Monitor INR for now  Patient needs to see hematology oncology for management of INR  Is appt scheduled?please schedule at [facility] H/O for b/l DVT variable INR   TMS

## 2018-04-24 NOTE — Telephone Encounter (Signed)
[facility] calling needing to know how often INR needs to be checked. Please advise. Call back [place] [phone]

## 2018-04-24 NOTE — Telephone Encounter (Signed)
Please advise 

## 2018-04-25 ENCOUNTER — Telehealth: Payer: Self-pay

## 2018-04-25 ENCOUNTER — Telehealth: Payer: Self-pay | Admitting: Internal Medicine

## 2018-04-25 NOTE — Telephone Encounter (Signed)
Copied from [place] [phone]. Topic: Quick Communication - See Telephone Encounter >> Apr 24, 2018  4:31 PM Babs Bertin, CMA wrote: CRM for notification. See Telephone encounter for:   Tried to call beth but VM is full and she did not pick phone call.  She needs to be informed of to check patients INR every 48 Hours.  04/24/18. >> Apr 25, 2018  9:30 AM Ivar Drape wrote: Guerry Minors w/[facility] [phone] reported that the patient's INR this morning was a 2.0

## 2018-04-25 NOTE — Telephone Encounter (Signed)
Spoke to son regarding the referral to hem/onc and gave him the number so he can call to schedule appointment since he declined it the first time hem/onc called to schedule.

## 2018-04-25 NOTE — Telephone Encounter (Signed)
Can you please send refills for all the medications that you fill for Jessica Erickson to [facility]

## 2018-04-25 NOTE — Telephone Encounter (Unsigned)
Copied from [place] [phone]. Topic: Quick Communication - Rx Refill/Question >> Apr 25, 2018  1:23 PM Neva Seat wrote: Pt is leaving [facility] on Sunday 04-28-18 to move back to her home. Pt's son is calling to get her medications ready for her move to home.  Needing to know what needs to be done to get all medications ready for her on Sunday. Please call son back asap.

## 2018-04-25 NOTE — Telephone Encounter (Signed)
INR 2.0 this morning.

## 2018-04-25 NOTE — Telephone Encounter (Unsigned)
Copied from [place] [id number]. Topic: Quick Communication - Rx Refill/Question >> Apr 25, 2018  1:23 PM Neva Seat wrote: Pt is leaving [place] on Sunday 04-28-18 to move back to her home. Pt's son is calling to get her medications ready for her move to home.  Needing to know what needs to be done to get all medications ready for her on Sunday. Please call son back asap.

## 2018-04-25 NOTE — Telephone Encounter (Signed)
I spoke with son & he will contact [facility] to find out more information. He will call us back if anything further is needed.

## 2018-04-25 NOTE — Telephone Encounter (Signed)
Please advise 

## 2018-04-25 NOTE — Telephone Encounter (Signed)
Is there a certain way this is supposed to happen? isnt the facility supposed to take care of that?

## 2018-04-27 ENCOUNTER — Other Ambulatory Visit: Payer: Self-pay | Admitting: Internal Medicine

## 2018-04-27 DIAGNOSIS — G500 Trigeminal neuralgia: Secondary | ICD-10-CM

## 2018-04-27 DIAGNOSIS — E2839 Other primary ovarian failure: Secondary | ICD-10-CM

## 2018-04-27 DIAGNOSIS — D649 Anemia, unspecified: Secondary | ICD-10-CM

## 2018-04-27 DIAGNOSIS — J449 Chronic obstructive pulmonary disease, unspecified: Secondary | ICD-10-CM

## 2018-04-27 DIAGNOSIS — E119 Type 2 diabetes mellitus without complications: Secondary | ICD-10-CM

## 2018-04-27 DIAGNOSIS — E039 Hypothyroidism, unspecified: Secondary | ICD-10-CM

## 2018-04-27 DIAGNOSIS — G4700 Insomnia, unspecified: Secondary | ICD-10-CM

## 2018-04-27 DIAGNOSIS — K5900 Constipation, unspecified: Secondary | ICD-10-CM

## 2018-04-27 DIAGNOSIS — K219 Gastro-esophageal reflux disease without esophagitis: Secondary | ICD-10-CM

## 2018-04-27 DIAGNOSIS — R600 Localized edema: Secondary | ICD-10-CM

## 2018-04-27 DIAGNOSIS — I824Y3 Acute embolism and thrombosis of unspecified deep veins of proximal lower extremity, bilateral: Secondary | ICD-10-CM

## 2018-04-27 DIAGNOSIS — J309 Allergic rhinitis, unspecified: Secondary | ICD-10-CM

## 2018-04-27 DIAGNOSIS — H04123 Dry eye syndrome of bilateral lacrimal glands: Secondary | ICD-10-CM

## 2018-04-27 MED ORDER — TIOTROPIUM BROMIDE MONOHYDRATE 18 MCG IN CAPS: 18.0000 ug | ORAL_CAPSULE | Freq: Every day | RESPIRATORY_TRACT | 3 refills | Status: DC

## 2018-04-27 MED ORDER — BISACODYL 5 MG PO TBEC: 5.0000 mg | DELAYED_RELEASE_TABLET | Freq: Every day | ORAL | 5 refills | Status: DC | PRN

## 2018-04-27 MED ORDER — ATORVASTATIN CALCIUM 40 MG PO TABS: 40.0000 mg | ORAL_TABLET | Freq: Every day | ORAL | 3 refills | Status: DC

## 2018-04-27 MED ORDER — DOCUSATE SODIUM 100 MG PO CAPS: 100.0000 mg | ORAL_CAPSULE | Freq: Two times a day (BID) | ORAL | 3 refills | Status: DC | PRN

## 2018-04-27 MED ORDER — GABAPENTIN 100 MG PO CAPS
100.0000 mg | ORAL_CAPSULE | Freq: Two times a day (BID) | ORAL | 1 refills | Status: DC
Start: 2018-04-27 — End: 2018-10-16

## 2018-04-27 MED ORDER — WARFARIN SODIUM 10 MG PO TABS: ORAL_TABLET | ORAL | 0 refills | Status: DC

## 2018-04-27 MED ORDER — WARFARIN SODIUM 3 MG PO TABS: ORAL_TABLET | ORAL | 2 refills | Status: DC

## 2018-04-27 MED ORDER — CALCIUM CARBONATE-VITAMIN D 600-400 MG-UNIT PO TABS: 1.0000 | ORAL_TABLET | Freq: Two times a day (BID) | ORAL | 3 refills | Status: DC

## 2018-04-27 MED ORDER — BUDESONIDE-FORMOTEROL FUMARATE 160-4.5 MCG/ACT IN AERO: 2.0000 | INHALATION_SPRAY | Freq: Two times a day (BID) | RESPIRATORY_TRACT | 11 refills | Status: DC

## 2018-04-27 MED ORDER — TRAZODONE HCL 50 MG PO TABS: 25.0000 mg | ORAL_TABLET | Freq: Every evening | ORAL | 0 refills | Status: DC | PRN

## 2018-04-27 MED ORDER — PANTOPRAZOLE SODIUM 40 MG PO TBEC: 40.0000 mg | DELAYED_RELEASE_TABLET | Freq: Every day | ORAL | 3 refills | Status: DC

## 2018-04-27 MED ORDER — LEVOTHYROXINE SODIUM 200 MCG PO TABS: 200.0000 ug | ORAL_TABLET | Freq: Every day | ORAL | 1 refills | Status: DC

## 2018-04-27 MED ORDER — FLUTICASONE PROPIONATE 50 MCG/ACT NA SUSP: 1.0000 | Freq: Every day | NASAL | 11 refills | Status: DC

## 2018-04-27 MED ORDER — METFORMIN HCL ER 500 MG PO TB24: 500.0000 mg | ORAL_TABLET | Freq: Every day | ORAL | 3 refills | Status: DC

## 2018-04-27 MED ORDER — POLYETHYLENE GLYCOL 3350 17 GM/SCOOP PO POWD: 17.0000 g | Freq: Every day | ORAL | 11 refills | Status: DC | PRN

## 2018-04-27 MED ORDER — POLYVINYL ALCOHOL 1.4 % OP SOLN: 1.0000 [drp] | OPHTHALMIC | 11 refills | Status: DC | PRN

## 2018-04-27 MED ORDER — FERROUS SULFATE 325 (65 FE) MG PO TABS
325.0000 mg | ORAL_TABLET | Freq: Every day | ORAL | 3 refills | Status: DC
Start: 2018-04-27 — End: 2018-05-17

## 2018-04-27 MED ORDER — FUROSEMIDE 40 MG PO TABS: 40.0000 mg | ORAL_TABLET | Freq: Every day | ORAL | 2 refills | Status: DC

## 2018-04-27 MED ORDER — CARBAMAZEPINE 200 MG PO TABS: 200.0000 mg | ORAL_TABLET | Freq: Two times a day (BID) | ORAL | 3 refills | Status: DC

## 2018-04-27 MED ORDER — ENOXAPARIN SODIUM 100 MG/ML ~~LOC~~ SOLN: SUBCUTANEOUS | 0 refills | Status: DC

## 2018-04-27 NOTE — Telephone Encounter (Signed)
[facility] of [facility] inform son  Also inform son [name] pt needs to get an appt with hematology/oncology for DVT management on coumadin  Likely Coumadin/INR hard to control b/c on tegretol which ive already disc'ed with nancy    tMS

## 2018-04-30 ENCOUNTER — Other Ambulatory Visit (INDEPENDENT_AMBULATORY_CARE_PROVIDER_SITE_OTHER): Payer: Self-pay | Admitting: Vascular Surgery

## 2018-04-30 ENCOUNTER — Inpatient Hospital Stay: Payer: Medicare Other

## 2018-04-30 ENCOUNTER — Telehealth: Payer: Self-pay

## 2018-04-30 ENCOUNTER — Other Ambulatory Visit: Payer: Self-pay

## 2018-04-30 ENCOUNTER — Encounter: Payer: Self-pay | Admitting: Oncology

## 2018-04-30 ENCOUNTER — Inpatient Hospital Stay: Payer: Medicare Other | Attending: Oncology | Admitting: Oncology

## 2018-04-30 VITALS — BP 128/86 | HR 76 | Temp 98.1°F | Resp 12 | Ht 67.0 in | Wt 186.1 lb

## 2018-04-30 DIAGNOSIS — Z87891 Personal history of nicotine dependence: Secondary | ICD-10-CM

## 2018-04-30 DIAGNOSIS — Z7901 Long term (current) use of anticoagulants: Secondary | ICD-10-CM

## 2018-04-30 DIAGNOSIS — M7981 Nontraumatic hematoma of soft tissue: Secondary | ICD-10-CM

## 2018-04-30 DIAGNOSIS — D649 Anemia, unspecified: Secondary | ICD-10-CM

## 2018-04-30 DIAGNOSIS — I824Y3 Acute embolism and thrombosis of unspecified deep veins of proximal lower extremity, bilateral: Secondary | ICD-10-CM | POA: Insufficient documentation

## 2018-04-30 DIAGNOSIS — R791 Abnormal coagulation profile: Secondary | ICD-10-CM | POA: Diagnosis not present

## 2018-04-30 DIAGNOSIS — Z95828 Presence of other vascular implants and grafts: Secondary | ICD-10-CM | POA: Diagnosis not present

## 2018-04-30 DIAGNOSIS — I82403 Acute embolism and thrombosis of unspecified deep veins of lower extremity, bilateral: Secondary | ICD-10-CM

## 2018-04-30 DIAGNOSIS — I82423 Acute embolism and thrombosis of iliac vein, bilateral: Secondary | ICD-10-CM

## 2018-04-30 DIAGNOSIS — Z789 Other specified health status: Secondary | ICD-10-CM

## 2018-04-30 LAB — CBC WITH DIFFERENTIAL/PLATELET
Basophils Absolute: 0 10*3/uL (ref 0–0.1)
Basophils Relative: 1 %
Eosinophils Absolute: 0.1 10*3/uL (ref 0–0.7)
Eosinophils Relative: 1 %
HCT: 34.6 % — ABNORMAL LOW (ref 35.0–47.0)
Hemoglobin: 11.5 g/dL — ABNORMAL LOW (ref 12.0–16.0)
Lymphocytes Relative: 20 %
Lymphs Abs: 1.6 10*3/uL (ref 1.0–3.6)
MCH: 30.6 pg (ref 26.0–34.0)
MCHC: 33.3 g/dL (ref 32.0–36.0)
MCV: 91.8 fL (ref 80.0–100.0)
Monocytes Absolute: 0.9 10*3/uL (ref 0.2–0.9)
Monocytes Relative: 11 %
Neutro Abs: 5.2 10*3/uL (ref 1.4–6.5)
Neutrophils Relative %: 67 %
Platelets: 336 10*3/uL (ref 150–440)
RBC: 3.77 MIL/uL — ABNORMAL LOW (ref 3.80–5.20)
RDW: 15.8 % — ABNORMAL HIGH (ref 11.5–14.5)
WBC: 7.8 10*3/uL (ref 3.6–11.0)

## 2018-04-30 LAB — PROTIME-INR
INR: 4.09
Prothrombin Time: 39.4 seconds — ABNORMAL HIGH (ref 11.4–15.2)

## 2018-04-30 LAB — APTT: aPTT: 88 seconds — ABNORMAL HIGH (ref 24–36)

## 2018-04-30 LAB — FERRITIN: Ferritin: 21 ng/mL (ref 11–307)

## 2018-04-30 LAB — IRON AND TIBC
Iron: 13 ug/dL — ABNORMAL LOW (ref 28–170)
Saturation Ratios: 7 % — ABNORMAL LOW (ref 10.4–31.8)
TIBC: 189 ug/dL — ABNORMAL LOW (ref 250–450)
UIBC: 176 ug/dL

## 2018-04-30 LAB — FOLATE: Folate: 39 ng/mL (ref >5.9)

## 2018-04-30 LAB — VITAMIN B12: Vitamin B-12: 432 pg/mL (ref 180–914)

## 2018-04-30 NOTE — Telephone Encounter (Signed)
Please make sure patients son has this set up for Jessica Erickson.

## 2018-04-30 NOTE — Progress Notes (Signed)
Hematology/Oncology Consult note [facility] [phone] [phone]   Patient Care Team: McLean-Scocuzza, Nino Glow, MD as PCP - General (Internal Medicine)  REFERRING PROVIDER: McLean-Scocuzza, Nino Glow, MD CHIEF COMPLAINTS/PURPOSE OF CONSULTATION:  Evaluation of DVT and management of anticoagulation.   HISTORY OF PRESENTING ILLNESS:  Jessica Erickson is a  84 y.o.  female with PMH listed below who was referred to me for evaluation of DVT and chronic anticoagulation.  She has hard hearing and most history was obtained from her son and caregiver who accompanied her to clinic today.   Patient had a history of provoked right femoral and popliteal DVT and PE in 2016 after an episode of fall and back procedure. S/p IVC filter placed. She was on coumadin (19mg  daily per pcp) for anticoagulation for a period of time and eventually came off.  IVC filter was left in after a discussion between Dr.Dew and family, given her advanced age and multiple comorbidities.   Patient presented to ER for evaluation of a week of worsening LE swelling and pain.  03/29/2018 US showed extensive bilateral lower extremity DVT.  She was restarted on anticoagulation. Currently on Lovenox bridge to coumadin, per son with INR goal between 2.5-3.  Currently she is on coumadin 19mg .   04/04/2018 she had right upper extremity pain and swelling. US showed a subcutaneous soft tissue hematoma   Patient reports feeling better today. Her right upper extremity has been much better now, denies any tenderness or swelling. Denies chest pain, SOB. Lower extremity swelling is about the same.   Review of Systems  Constitutional: Positive for malaise/fatigue. Negative for chills, fever and weight loss.  HENT: Positive for hearing loss. Negative for congestion, ear discharge, ear pain, nosebleeds, sinus pain and sore throat.   Eyes: Negative for double vision, photophobia, pain, discharge and  redness.  Respiratory: Negative for cough, hemoptysis, sputum production, shortness of breath and wheezing.   Cardiovascular: Positive for leg swelling. Negative for chest pain, palpitations, orthopnea and claudication.  Gastrointestinal: Negative for abdominal pain, blood in stool, heartburn, melena, nausea and vomiting.  Genitourinary: Negative for dysuria, flank pain, frequency and hematuria.  Musculoskeletal: Negative for back pain, myalgias and neck pain.  Skin: Negative for itching and rash.  Neurological: Negative for dizziness, tingling, tremors, focal weakness, weakness and headaches.  Endo/Heme/Allergies: Negative for environmental allergies. Does not bruise/bleed easily.  Psychiatric/Behavioral: Negative for depression and hallucinations. The patient is not nervous/anxious.     MEDICAL HISTORY:  Past Medical History:  Diagnosis Date  . Acoustic neuroma ([place])   . Allergy   . Asthma   . Bilateral swelling of feet    and legs  . Bladder infection   . CAD (coronary artery disease)   . Cataract   . Change in voice   . Compression fracture of body of thoracic vertebra (HCC)    T12 09/18/15 MRI s/p fall   . Constipation   . COPD (chronic obstructive pulmonary disease) ([place])    previous CXR with chronic interstitial lung dz   . CVA (cerebral vascular accident) ([place])   . Depression   . Diabetes ([place])    with neuropathy  . Diabetes mellitus, type 2 ([place])   . Diarrhea   . Double vision   . DVT (deep venous thrombosis) ([place])    right leg 10/2015 was on coumadin off as of 2017/2018 ; s/p IVC filter  . Enuresis   . Eye pain, right   . Fall   .  Fatty liver    09/15/15 also mildly dilated pancreatitic duct rec MRCP small sub cm cyst hemangioma speeln mild right hydronephrorossi and prox. hydroureter, kidney stones, mild scarring kidneys  . Female stress incontinence   . Flank pain   . GERD (gastroesophageal reflux disease)    with small hiatal hernia   . Hard of hearing   .  Heart disease   . History of kidney problems   . Hyperlipidemia    mixed  . Hypertension   . Hypothyroidism, postsurgical   . Impaired mobility and ADLs    uses rolling walker has caretaker 24/7 at home  . Leg edema   . Mixed incontinence urge and stress (female)(female)   . Neuropathy   . Osteoarthritis    DDD spine   . Osteoporosis with fracture    T12 compression fracture  . Photophobia   . Pulmonary embolism ([place])    10/2015 off coumadin as of 04/2016  . Pulmonary HTN (HCC)    mild pulm HTN, echo 10/09/15 EF 62-83%TDVVO 1 dd, RV systolic pressure increased   . Recurrent UTI   . Sinus pressure   . Skin cancer    BCC jawline and scalp   . Thyroid disease    follows [facility]  . TIA (transient ischemic attack)    MRI 2009/2010 neg stroke   . Trigeminal neuralgia    Dr. Tomi Bamberger s/p gamma knife x 2, on Tegretol since 2011/2012 no increase in dose >200 mg bid rec per family per neurology   . Urinary frequency   . Urinary, incontinence, stress female    Dr Erlene Quan urology     SURGICAL HISTORY: Past Surgical History:  Procedure Laterality Date  . APPENDECTOMY     as a child, open  . BRAIN SURGERY     schwnnoma removal 1996   . brain tumor surgery    . BREAST SURGERY     breast bx  . CATARACT EXTRACTION    . CHOLECYSTECTOMY    . EYE SURGERY     cataract  . IVC FILTER PLACEMENT ([facility] HX)     Dr. Lucky Cowboy 10/2015   . LAPAROSCOPIC TUBAL LIGATION    . MOHS SURGERY     scalp 04/2014   . PERIPHERAL VASCULAR CATHETERIZATION N/A 10/11/2015   Procedure: IVC Filter Insertion;  Surgeon: Algernon Huxley, MD;  Location: [facility];  Service: Cardiovascular;  Laterality: N/A;  . PERIPHERAL VASCULAR THROMBECTOMY Bilateral 03/29/2018   Procedure: PERIPHERAL VASCULAR THROMBECTOMY;  Surgeon: Algernon Huxley, MD;  Location: [facility];  Service: Cardiovascular;  Laterality: Bilateral;  . PUBOVAGINAL SLING    . THROAT SURGERY    . THYROID SURGERY     tumor around vocal  cords   . TOOTH EXTRACTION     winter 2018   . TOTAL THYROIDECTOMY  1976    SOCIAL HISTORY: Social History   Socioeconomic History  . Marital status: Widowed    Spouse name: Not on file  . Number of children: 5  . Years of education: Not on file  . Highest education level: Not on file  Occupational History  . Occupation: retired  Scientific laboratory technician  . Financial resource strain: Not on file  . Food insecurity:    Worry: Not on file    Inability: Not on file  . Transportation needs:    Medical: Not on file    Non-medical: Not on file  Tobacco Use  . Smoking status: Former Smoker  Types: Cigarettes    Last attempt to quit: 09/20/1995    Years since quitting: 22.6  . Smokeless tobacco: Never Used  . Tobacco comment: quit 1996 smoked 20 years max 8 cig qd   Substance and Sexual Activity  . Alcohol use: No  . Drug use: No  . Sexual activity: Not on file  Lifestyle  . Physical activity:    Days per week: Not on file    Minutes per session: Not on file  . Stress: Not on file  Relationships  . Social connections:    Talks on phone: Not on file    Gets together: Not on file    Attends religious service: Not on file    Active member of club or organization: Not on file    Attends meetings of clubs or organizations: Not on file    Relationship status: Not on file  . Intimate partner violence:    Fear of current or ex partner: Not on file    Emotionally abused: Not on file    Physically abused: Not on file    Forced sexual activity: Not on file  Other Topics Concern  . Not on file  Social History Narrative   Lives at home with caretaker        FAMILY HISTORY: Family History  Problem Relation Age of Onset  . Heart disease Mother   . Diabetes Father   . Cancer Daughter        breast ca x 2 s/p mastectomy     ALLERGIES:  is allergic to penicillins; sulfa antibiotics; amitiza [lubiprostone]; aspirin; and penicillin g.  MEDICATIONS:  Current Outpatient Medications    Medication Sig Dispense Refill  . acetaminophen (TYLENOL) 325 MG tablet Take 650 mg by mouth every 6 (six) hours as needed.    . Amino Acids-Protein Hydrolys (FEEDING SUPPLEMENT, PRO-STAT SUGAR FREE 64,) LIQD Take 30 mLs by mouth 2 (two) times daily between meals.    Marland Kitchen atorvastatin (LIPITOR) 40 MG tablet Take 1 tablet (40 mg total) by mouth daily at 6 PM. Generic ok 90 tablet 3  . bisacodyl (DULCOLAX) 5 MG EC tablet Take 1 tablet (5 mg total) by mouth daily as needed for moderate constipation. 30 tablet 5  . budesonide-formoterol (SYMBICORT) 160-4.5 MCG/ACT inhaler Inhale 2 puffs into the lungs 2 (two) times daily. Rinse mouth 1 Inhaler 11  . Calcium Carbonate-Vitamin D (CALCIUM 600+D) 600-400 MG-UNIT tablet Take 1 tablet by mouth 2 (two) times daily. Lunch and dinner 90 tablet 3  . carbamazepine (TEGRETOL) 200 MG tablet Take 1 tablet (200 mg total) by mouth 2 (two) times daily. 180 tablet 3  . docusate sodium (COLACE) 100 MG capsule Take 1 capsule (100 mg total) by mouth 2 (two) times daily as needed for mild constipation. 180 capsule 3  . ferrous sulfate 325 (65 FE) MG tablet Take 1 tablet (325 mg total) by mouth daily after lunch. 90 tablet 3  . fluticasone (FLONASE) 50 MCG/ACT nasal spray Place 1-2 sprays into both nostrils daily. Max 2 sprays 16 g 11  . furosemide (LASIX) 40 MG tablet Take 1 tablet (40 mg total) by mouth daily. 30 tablet 2  . gabapentin (NEURONTIN) 100 MG capsule Take 1 capsule (100 mg total) by mouth 2 (two) times daily. 180 capsule 1  . guaiFENesin (MUCINEX) 600 MG 12 hr tablet Take 600 mg by mouth daily.     Marland Kitchen HYDROcodone-acetaminophen (NORCO/VICODIN) 5-325 MG tablet Take 1 tablet by mouth every 6 (six)  hours as needed for moderate pain or severe pain. 15 tablet 0  . levothyroxine (SYNTHROID, LEVOTHROID) 200 MCG tablet Take 1 tablet (200 mcg total) by mouth daily before breakfast. Except on Sunday. Do not take with other medications or vitamins 90 tablet 1  . lidocaine  (LIDODERM) 5 % Place 1 patch onto the skin daily. Remove & Discard patch within 12 hours or as directed by MD 30 patch 0  . metFORMIN (GLUCOPHAGE-XR) 500 MG 24 hr tablet Take 1 tablet (500 mg total) by mouth daily with breakfast. 90 tablet 3  . Multiple Vitamin (MULTIVITAMIN WITH MINERALS) TABS tablet Take 1 tablet by mouth daily.    . Nutritional Supplements (ENSURE ENLIVE PO) Take 1 Bottle by mouth 2 (two) times daily.    . ondansetron (ZOFRAN) 4 MG tablet Take 1 tablet (4 mg total) by mouth 2 (two) times daily as needed for nausea or vomiting. 30 tablet 0  . pantoprazole (PROTONIX) 40 MG tablet Take 1 tablet (40 mg total) by mouth daily. 30 minutes before lunch or dinner 90 tablet 3  . polyethylene glycol powder (GLYCOLAX/MIRALAX) powder Take 17 g by mouth daily as needed for mild constipation. 255 g 11  . polyvinyl alcohol (LIQUIFILM TEARS) 1.4 % ophthalmic solution Place 1 drop into both eyes as needed for dry eyes. 15 mL 11  . senna-docusate (SENOKOT-S) 8.6-50 MG tablet Take 1 tablet by mouth at bedtime as needed for mild constipation.    Marland Kitchen tiotropium (SPIRIVA) 18 MCG inhalation capsule Place 1 capsule (18 mcg total) into inhaler and inhale daily. 90 capsule 3  . traZODone (DESYREL) 50 MG tablet Take 0.5 tablets (25 mg total) by mouth at bedtime as needed for sleep. 20 tablet 0  . ipratropium-albuterol (DUONEB) 0.5-2.5 (3) MG/3ML SOLN Take 3 mLs by nebulization 3 (three) times daily as needed. 360 mL 11  . warfarin (COUMADIN) 10 MG tablet Total 18 mg qpm (3 mg +3+2 + 10 mg qd) 90 tablet 0  . warfarin (COUMADIN) 2 MG tablet Take 1 tablet (2 mg total) by mouth daily. Total 18 mg qd 10 mg + 3 mg +3mg  +2 mg 90 tablet 0  . warfarin (COUMADIN) 3 MG tablet Total 18 mg per day ( 3+3+2 mg + 10 mg daily) 90 tablet 2   No current facility-administered medications for this visit.      PHYSICAL EXAMINATION: ECOG PERFORMANCE STATUS: 3 - Symptomatic, >50% confined to bed Vitals:   04/30/18 1139  04/30/18 1152  BP:  128/86  Pulse:  76  Resp: 12   Temp:  98.1 F (36.7 C)   Filed Weights   04/30/18 1139  Weight: 186 lb 1.6 oz (84.4 kg)    Physical Exam  Constitutional: She is oriented to person, place, and time. She appears well-developed. No distress.  Sitting in wheel chair.   HENT:  Head: Normocephalic and atraumatic.  Right Ear: External ear normal.  Left Ear: External ear normal.  Mouth/Throat: Oropharynx is clear and moist.  Eyes: Pupils are equal, round, and reactive to light. EOM are normal. No scleral icterus.  pale  Neck: Normal range of motion. Neck supple.  Cardiovascular: Normal rate, regular rhythm and normal heart sounds.  Pulmonary/Chest: Effort normal and breath sounds normal. No respiratory distress. She has no wheezes. She has no rales. She exhibits no tenderness.  Abdominal: Soft. Bowel sounds are normal. She exhibits no distension and no mass. There is no tenderness.  Musculoskeletal: Normal range of motion. She exhibits no  edema or deformity.  Neurological: She is alert and oriented to person, place, and time. No cranial nerve deficit. Coordination normal.  Skin: Skin is warm and dry. No rash noted.  Psychiatric: She has a normal mood and affect. Her behavior is normal.     LABORATORY DATA:  I have reviewed the data as listed Lab Results  Component Value Date   WBC 7.8 04/30/2018   HGB 11.5 (L) 04/30/2018   HCT 34.6 (L) 04/30/2018   MCV 91.8 04/30/2018   PLT 336 04/30/2018   Recent Labs    03/15/18 0528 03/27/18 1815  03/30/18 0745 04/03/18 2149 04/06/18 0528  NA 137 139   < > 139 137 140  K 4.5 4.0   < > 3.9 3.9 3.7  CL 102 100*   < > 104 100* 106  CO2 27 32   < > 29 29 28   GLUCOSE 105* 113*   < > 106* 133* 119*  BUN 14 20   < > 16 18 11   CREATININE 1.03* 1.15*   < > 1.28* 1.05* 0.85  CALCIUM 8.4* 8.5*   < > 7.8* 7.9* 7.4*  GFRNONAA 47* 41*   < > 36* 46* 59*  GFRAA 54* 47*   < > 42* 53* >60  PROT 6.1* 6.9  --   --  5.8*  --     ALBUMIN 2.5* 3.4*  --   --  2.6*  --   AST 29 25  --   --  36  --   ALT 29 13*  --   --  19  --   ALKPHOS 78 81  --   --  69  --   BILITOT 0.4 0.3  --   --  0.4  --    < > = values in this interval not displayed.      ASSESSMENT & PLAN:  1. Acute deep vein thrombosis (DVT) of proximal vein of both lower extremities (HCC)   2. Anemia, unspecified type   3. Supratherapeutic INR   4. Drug interaction   5. Presence of IVC filter   6. Hematoma, nontraumatic, soft tissue    # Recurrent acute extensive bilateral occlusive lower extremity DVTm through femoral vein and popliteal vein:  Symptomatic.  She has IVC filter. Currently she is on Lovenox bridging to coumadin. She takes 19 mg coumadin.  Will check her INR today.  Agree with long term anticoagulation given her clot burden, and decision of permanent IVC, imobilization due to advance age and multiple comorbidity.  She is also at risk of fall and have high risk of bleeding.recommend fall precaution. Discussed with patient and her son.  I recommend her INR goal to be within 2-3. She requires high dose warfarin due to the drug drug interaction of carbamazepine. Also DOACs are not options due to interactions.  If her INR are labile and difficult to stay in therapeutic level, other option will be   I have reviewed labs. She has supratheraputic INR 4.09, called patient and advise her to stop Lovenox and hold coumadin. The original plan is patient to have INR done again and follow up with pcp for titration of INR. Epic message of instructions of INR goal and anticoagulation recommendation was sent to primary care physician Dr.McLean.  05/02/2018: I talked to Dr.McLean over the phone. Dr.Mclean prefers patient's anticoagulation to be managed by me until her dosage of coumadin settled.  Patient had repeat INR today which showed therapeutic at 2.6. Called  patient and instruct her to take coumadin 16mg  today. Repeat INR tomorrow for further titration.   Plan repeat vascular ultrasound in 2-3 months.   # Anemia: check iron tibc ferritin, folate b12.  Labs reviewed, consistent with iron deficiency anemia. Advise patient to take oral ferrous sulfate 325mg  BID. Take OTC colace as needed for constipation.   # right upper extremity soft tissue hematoma: resolved. No tenderness or swelling.  Follow up visit in 1 months.   All questions were answered. The patient knows to call the clinic with any problems questions or concerns.  Return of visit: 1 months, recheck cbc.  Thank you for this kind referral and the opportunity to participate in the care of this patient. A copy of today's note is routed to referring provider  Total face to face encounter time for this patient visit was 70 min. >50% of the time was  spent in counseling and coordination of care.    Earlie Server, MD, PhD Hematology Oncology [facility] at [facility] Pager- [phone] 04/30/2018

## 2018-04-30 NOTE — Telephone Encounter (Signed)
Per lab Jessica Erickson has an INR level at 4.09. Per Dr. Tasia Catchings hold coumadin and levonox for tonight and recheck INR levels in the AM. Jessica Erickson  Expressed understanding and agreed to call Jessica Erickson and speak with her nurse to inform them of the INR levels. Jessica Erickson has expressed Home health will come out in the morning and recheck levels as needed.

## 2018-04-30 NOTE — Progress Notes (Signed)
Patient here for initial visit son is her POA. Patient is very hard of hearing.

## 2018-05-01 ENCOUNTER — Encounter: Payer: Self-pay | Admitting: Podiatry

## 2018-05-01 ENCOUNTER — Telehealth: Payer: Self-pay

## 2018-05-01 ENCOUNTER — Ambulatory Visit: Payer: Medicare Other | Admitting: Podiatry

## 2018-05-01 ENCOUNTER — Telehealth: Payer: Self-pay | Admitting: Internal Medicine

## 2018-05-01 ENCOUNTER — Telehealth: Payer: Self-pay | Admitting: *Deleted

## 2018-05-01 ENCOUNTER — Encounter

## 2018-05-01 DIAGNOSIS — M79676 Pain in unspecified toe(s): Secondary | ICD-10-CM

## 2018-05-01 DIAGNOSIS — B351 Tinea unguium: Secondary | ICD-10-CM | POA: Diagnosis not present

## 2018-05-01 NOTE — Telephone Encounter (Signed)
Hold coumadin today  resume tomorrow 18 mg daily  Stop Lovenox d/c lovenox    TMS

## 2018-05-01 NOTE — Telephone Encounter (Signed)
Jessica Erickson from [place] called today to inquire about order for Jessica Erickson. She states that patient had an INR drawn on 04-29-18 and it was 2.8. Then it was done again on 04-30-18 and was 4.0 ( please see last note). She states that patients coumadin and lovenox were to be held last night and then lovenox held for this morning as well. Patient was given lovenox yesterday morning. She states that it was also documented that hematology/onocolgy would not be able to see Jessica Erickson and needs to be followed by PCP due to not having the staff. INR was drawn today and it was 3.8. Does patient need to have coumadin today as well as Lovenox.

## 2018-05-01 NOTE — Progress Notes (Signed)
She presents today chief complaint of painful elongated toenails 1 through 5 bilateral.  Objective: Vital signs are stable alert and oriented x3.  Toenails are long thick yellow dystrophic-like mycotic.  Assessment: Pain in limb secondary to onychomycosis.  Plan: Debridement of toenails 1 through 5 bilateral cover service secondary to pain.

## 2018-05-01 NOTE — Telephone Encounter (Signed)
See previous TE on 5/29

## 2018-05-01 NOTE — Telephone Encounter (Signed)
Notified home health nurse, Ginger, that  Dr Tasia Catchings has advised pt to hold Warfarin and Lovenox yesterday, home health to check INR today, send results to PCP.    Dr Tasia Catchings will not be managing pt's INR.

## 2018-05-01 NOTE — Telephone Encounter (Signed)
Copied from [place] [id number]. Topic: General - Other >> May 01, 2018  4:22 PM Yvette Rack wrote: Reason for CRM: Beth with [facility] called in stating the pt INR was 3.8 and the enoxaparin (LOVENOX) 100 MG/ML injection and the injection was held. Beth asked if the enoxaparin (LOVENOX) 100 MG/ML injection should be discontinued. Beth also stated that the Coumadin 19 MG was with held on 04/30/18 but it will be given to pt at 6 pm. Eustaquio Maize is requesting a call back before 5 pm if possible. Cb# [phone] before 5 pm. If it is after 5 pm she asked to be contacted at [phone]

## 2018-05-01 NOTE — Telephone Encounter (Signed)
Eustaquio Maize, nurse with [facility] notified of instructions per notes Dr. Terese Door on 5/29. Understanding verbalized.

## 2018-05-01 NOTE — Telephone Encounter (Signed)
FYI

## 2018-05-01 NOTE — Telephone Encounter (Addendum)
Home Health nurse Beth called to report that patient INR is 3.8 and that her Lovenox was stopped yesterday. She awaits further orders. Per medicine list patient is currently on 19 mg of warfarin daily

## 2018-05-01 NOTE — Telephone Encounter (Signed)
Called Guerry Minors from [place] back unable to leave message due to mailbox being full. Will try to call again later.Ok for [facility] to speak with [name] and give Dr. Claris Gladden instructions.

## 2018-05-02 ENCOUNTER — Other Ambulatory Visit: Payer: Self-pay | Admitting: Internal Medicine

## 2018-05-02 ENCOUNTER — Encounter: Payer: Self-pay | Admitting: Internal Medicine

## 2018-05-02 ENCOUNTER — Ambulatory Visit: Payer: Medicare Other | Admitting: Internal Medicine

## 2018-05-02 ENCOUNTER — Inpatient Hospital Stay: Payer: Medicare Other | Admitting: Internal Medicine

## 2018-05-02 ENCOUNTER — Telehealth: Payer: Self-pay | Admitting: *Deleted

## 2018-05-02 VITALS — BP 136/62 | HR 79 | Temp 99.1°F | Ht 67.0 in | Wt 181.6 lb

## 2018-05-02 DIAGNOSIS — R059 Cough, unspecified: Secondary | ICD-10-CM

## 2018-05-02 DIAGNOSIS — R791 Abnormal coagulation profile: Secondary | ICD-10-CM | POA: Insufficient documentation

## 2018-05-02 DIAGNOSIS — J189 Pneumonia, unspecified organism: Secondary | ICD-10-CM

## 2018-05-02 DIAGNOSIS — I824Y3 Acute embolism and thrombosis of unspecified deep veins of proximal lower extremity, bilateral: Secondary | ICD-10-CM

## 2018-05-02 DIAGNOSIS — R5381 Other malaise: Secondary | ICD-10-CM

## 2018-05-02 DIAGNOSIS — R05 Cough: Secondary | ICD-10-CM

## 2018-05-02 DIAGNOSIS — I890 Lymphedema, not elsewhere classified: Secondary | ICD-10-CM | POA: Diagnosis not present

## 2018-05-02 DIAGNOSIS — D509 Iron deficiency anemia, unspecified: Secondary | ICD-10-CM | POA: Diagnosis not present

## 2018-05-02 DIAGNOSIS — J449 Chronic obstructive pulmonary disease, unspecified: Secondary | ICD-10-CM

## 2018-05-02 DIAGNOSIS — E039 Hypothyroidism, unspecified: Secondary | ICD-10-CM | POA: Diagnosis not present

## 2018-05-02 DIAGNOSIS — J479 Bronchiectasis, uncomplicated: Secondary | ICD-10-CM | POA: Diagnosis not present

## 2018-05-02 DIAGNOSIS — Z95828 Presence of other vascular implants and grafts: Secondary | ICD-10-CM | POA: Insufficient documentation

## 2018-05-02 DIAGNOSIS — Z789 Other specified health status: Secondary | ICD-10-CM | POA: Insufficient documentation

## 2018-05-02 LAB — PROTIME-INR
INR: 2.6 ratio — ABNORMAL HIGH (ref 0.8–1.0)
Prothrombin Time: 29.8 s — ABNORMAL HIGH (ref 9.6–13.1)

## 2018-05-02 MED ORDER — WARFARIN SODIUM 2 MG PO TABS: 2.0000 mg | ORAL_TABLET | Freq: Every day | ORAL | 0 refills | Status: DC

## 2018-05-02 MED ORDER — WARFARIN SODIUM 10 MG PO TABS: ORAL_TABLET | ORAL | 0 refills | Status: DC

## 2018-05-02 MED ORDER — WARFARIN SODIUM 3 MG PO TABS: ORAL_TABLET | ORAL | 2 refills | Status: DC

## 2018-05-02 MED ORDER — IPRATROPIUM-ALBUTEROL 0.5-2.5 (3) MG/3ML IN SOLN: 3.0000 mL | Freq: Three times a day (TID) | RESPIRATORY_TRACT | 11 refills | Status: DC | PRN

## 2018-05-02 NOTE — Patient Instructions (Addendum)
Please do not mix iron pill with thyroid medication please take iron pill 4 hours after thyroid medication CT chest trying to schedule 05/06/18 [facility] Follow up in 1 month  Take care  Stop Lovenox  We will let you know if you are to continue Coumadin and at what dose   Chronic Obstructive Pulmonary Disease Chronic obstructive pulmonary disease (COPD) is a long-term (chronic) condition that affects the lungs. COPD is a general term that can be used to describe many different lung problems that cause lung swelling (inflammation) and limit airflow, including chronic bronchitis and emphysema. If you have COPD, your lung function will probably never return to normal. In most cases, it gets worse over time. However, there are steps you can take to slow the progression of the disease and improve your quality of life. What are the causes? This condition may be caused by:  Smoking. This is the most common cause.  Certain genes passed down through families.  What increases the risk? The following factors may make you more likely to develop this condition:  Secondhand smoke from cigarettes, pipes, or cigars.  Exposure to chemicals and other irritants such as fumes and dust in the work environment.  Chronic lung conditions or infections.  What are the signs or symptoms? Symptoms of this condition include:  Shortness of breath, especially during physical activity.  Chronic cough with a large amount of thick mucus. Sometimes the cough may not have any mucus (dry cough).  Wheezing.  Rapid breaths.  Gray or bluish discoloration (cyanosis) of the skin, especially in your fingers, toes, or lips.  Feeling tired (fatigue).  Weight loss.  Chest tightness.  Frequent infections.  Episodes when breathing symptoms become much worse (exacerbations).  Swelling in the ankles, feet, or legs. This may occur in later stages of the disease.  How is this diagnosed? This condition is diagnosed based  on:  Your medical history.  A physical exam.  You may also have tests, including:  Lung (pulmonary) function tests. This may include a spirometry test, which measures your ability to exhale properly.  Chest X-ray.  CT scan.  Blood tests.  How is this treated? This condition may be treated with:  Medicines. These may include inhaled rescue medicines to treat acute exacerbations as well as long-term, or maintenance, medicines to prevent flare-ups of COPD. ? Bronchodilators help treat COPD by dilating the airways to allow increased airflow and make your breathing more comfortable. ? Steroids can reduce airway inflammation and help prevent exacerbations.  Smoking cessation. If you smoke, your health care provider may ask you to quit, and may also recommend therapy or replacement products to help you quit.  Pulmonary rehabilitation. This may involve working with a team of health care providers and specialists, such as respiratory, occupational, and physical therapists.  Exercise and physical activity. These are beneficial for nearly all people with COPD.  Nutrition therapy to gain weight, if you are underweight.  Oxygen. Supplemental oxygen therapy is only helpful if you have a low oxygen level in your blood (hypoxemia).  Lung surgery or transplant.  Palliative care. This is to help people with COPD feel comfortable when treatment is no longer working.  Follow these instructions at home: Medicines  Take over-the-counter and prescription medicines (inhaled or pills) only as told by your health care provider.  Talk to your health care provider before taking any cough or allergy medicines. You may need to avoid certain medicines that dry out your airways. Lifestyle  If  you are a smoker, the most important thing that you can do is to stop smoking. Do not use any products that contain nicotine or tobacco, such as cigarettes and e-cigarettes. If you need help quitting, ask your  health care provider. Continuing to smoke will cause the disease to progress faster.  Avoid exposure to things that irritate your lungs, such as smoke, chemicals, and fumes.  Stay active, but balance activity with periods of rest. Exercise and physical activity will help you maintain your ability to do things you want to do.  Learn and use relaxation techniques to manage stress and to control your breathing.  Get the right amount of sleep and get quality sleep. Most adults need 7 or more hours per night.  Eat healthy foods. Eating smaller, more frequent meals and resting before meals may help you maintain your strength. Controlled breathing Learn and use controlled breathing techniques as directed by your health care provider. Controlled breathing techniques include:  Pursed lip breathing. Start by breathing in (inhaling) through your nose for 1 second. Then, purse your lips as if you were going to whistle and breathe out (exhale) through the pursed lips for 2 seconds.  Diaphragmatic breathing. Start by putting one hand on your abdomen just above your waist. Inhale slowly through your nose. The hand on your abdomen should move out. Then purse your lips and exhale slowly. You should be able to feel the hand on your abdomen moving in as you exhale.  Controlled coughing Learn and use controlled coughing to clear mucus from your lungs. Controlled coughing is a series of short, progressive coughs. The steps of controlled coughing are: 1. Lean your head slightly forward. 2. Breathe in deeply using diaphragmatic breathing. 3. Try to hold your breath for 3 seconds. 4. Keep your mouth slightly open while coughing twice. 5. Spit any mucus out into a tissue. 6. Rest and repeat the steps once or twice as needed.  General instructions  Make sure you receive all the vaccines that your health care provider recommends, especially the pneumococcal and influenza vaccines. Preventing infection and  hospitalization is very important when you have COPD.  Use oxygen therapy and pulmonary rehabilitation if directed to by your health care provider. If you require home oxygen therapy, ask your health care provider whether you should purchase a pulse oximeter to measure your oxygen level at home.  Work with your health care provider to develop a COPD action plan. This will help you know what steps to take if your condition gets worse.  Keep other chronic health conditions under control as told by your health care provider.  Avoid extreme temperature and humidity changes.  Avoid contact with people who have an illness that spreads from person to person (is contagious), such as viral infections or pneumonia.  Keep all follow-up visits as told by your health care provider. This is important. Contact a health care provider if:  You are coughing up more mucus than usual.  There is a change in the color or thickness of your mucus.  Your breathing is more labored than usual.  Your breathing is faster than usual.  You have difficulty sleeping.  You need to use your rescue medicines or inhalers more often than expected.  You have trouble doing routine activities such as getting dressed or walking around the house. Get help right away if:  You have shortness of breath while you are resting.  You have shortness of breath that prevents you from: ? Being  able to talk. ? Performing your usual physical activities.  You have chest pain lasting longer than 5 minutes.  Your skin color is more blue (cyanotic) than usual.  You measure low oxygen saturations for longer than 5 minutes with a pulse oximeter.  You have a fever.  You feel too tired to breathe normally. Summary  Chronic obstructive pulmonary disease (COPD) is a long-term (chronic) condition that affects the lungs.  Your lung function will probably never return to normal. In most cases, it gets worse over time. However, there  are steps you can take to slow the progression of the disease and improve your quality of life.  Treatment for COPD may include taking medicines, quitting smoking, pulmonary rehabilitation, and changes to diet and exercise. As the disease progresses, you may need oxygen therapy, a lung transplant, or palliative care.  To help manage your condition, do not smoke, avoid exposure to things that irritate your lungs, stay up to date on all vaccines, and follow your health care provider's instructions for taking medicines. This information is not intended to replace advice given to you by your health care provider. Make sure you discuss any questions you have with your health care provider. Document Released: 08/30/2005 Document Revised: 12/25/2016 Document Reviewed: 12/25/2016 Elsevier Interactive Patient Education  2018 [employer].  Bronchiectasis Bronchiectasis is a condition in which the airways (bronchi) are damaged and widened. This makes it difficult for the lungs to get rid of mucus. As a result, mucus gathers in the airways, and this often leads to lung infections. Infection can cause inflammation in the airways, which may further weaken and damage the bronchi. What are the causes? Bronchiectasis may be present at birth (congenital) or may develop later in life. Sometimes there is no apparent cause. Some common causes include:  Cystic fibrosis.  Recurrent lung infections (such as pneumonia, tuberculosis, or fungal infections).  Foreign bodies or other blockages in the lungs.  Breathing in fluid, food, or other foreign objects (aspiration).  What are the signs or symptoms? Common symptoms include:  A daily cough that brings up mucus and lasts for more than 3 weeks.  Frequent lung infections (such as pneumonia, tuberculosis, or fungal infections).  Shortness of breath and wheezing.  Weakness and fatigue.  How is this diagnosed? Various tests may be done to help diagnose  bronchiectasis. Tests may include:  Chest X-rays or CT scans.  Breathing tests to help determine how your lungs are working.  Sputum cultures to check for infection.  Blood tests and other tests to check for related diseases or causes, such as cystic fibrosis.  How is this treated? Treatment varies depending on the severity of the condition. Medicines may be given to loosen the mucus to be coughed up (expectorants), to relax the muscles of the air passages (bronchodilators), or to prevent or treat infections (antibiotics). Physical therapy methods may be recommended to help clear mucus from the lungs. For severe cases, surgery may be done to remove the affected part of the lung. Follow these instructions at home:  Get plenty of rest.  Only take over-the-counter or prescription medicines as directed by your health care provider. If antibiotic medicines were prescribed, take them as directed. Finish them even if you start to feel better.  Avoid sedatives and antihistamines unless otherwise directed by your health care provider. These medicines tend to thicken the mucus in the lungs.  Perform any breathing exercises or techniques to clear the lungs as directed by your health care  provider.  Drink enough fluids to keep your urine clear or pale yellow.  Consider using a cold steam vaporizer or humidifier in your room or home to help loosen secretions.  If the cough is worse at night, try sleeping in a semi-upright position in a recliner or using a couple of pillows.  Avoid cigarette smoke and lung irritants. If you smoke, quit.  Stay inside when pollution and ozone levels are high.  Stay current with vaccinations and immunizations.  Follow up with your health care provider as directed. Contact a health care provider if:  You cough up more thick, discolored mucus (sputum) that is yellow to green in color.  You have a fever or persistent symptoms for more than 2-3 days.  You cannot  control your cough and are losing sleep. Get help right away if:  You cough up blood.  You have chest pain or increasing shortness of breath.  You have pain that is getting worse or is uncontrolled with medicines.  You have a fever and your symptoms suddenly get worse. This information is not intended to replace advice given to you by your health care provider. Make sure you discuss any questions you have with your health care provider. Document Released: 09/17/2007 Document Revised: 05/03/2016 Document Reviewed: 05/28/2013 Elsevier Interactive Patient Education  2017 [place] Pneumonia, Adult Pneumonia is an infection of the lungs. One type of pneumonia can happen while a person is in a hospital. A different type can happen when a person is not in a hospital (community-acquired pneumonia). It is easy for this kind to spread from person to person. It can spread to you if you breathe near an infected person who coughs or sneezes. Some symptoms include:  A dry cough.  A wet (productive) cough.  Fever.  Sweating.  Chest pain.  Follow these instructions at home:  Take over-the-counter and prescription medicines only as told by your doctor. ? Only take cough medicine if you are losing sleep. ? If you were prescribed an antibiotic medicine, take it as told by your doctor. Do not stop taking the antibiotic even if you start to feel better.  Sleep with your head and neck raised (elevated). You can do this by putting a few pillows under your head, or you can sleep in a recliner.  Do not use tobacco products. These include cigarettes, chewing tobacco, and e-cigarettes. If you need help quitting, ask your doctor.  Drink enough water to keep your pee (urine) clear or pale yellow. A shot (vaccine) can help prevent pneumonia. Shots are often suggested for:  People older than 82 years of age.  People older than 82 years of age: ? Who are having cancer  treatment. ? Who have long-term (chronic) lung disease. ? Who have problems with their body's defense system (immune system).  You may also prevent pneumonia if you take these actions:  Get the flu (influenza) shot every year.  Go to the dentist as often as told.  Wash your hands often. If soap and water are not available, use hand sanitizer.  Contact a doctor if:  You have a fever.  You lose sleep because your cough medicine does not help. Get help right away if:  You are short of breath and it gets worse.  You have more chest pain.  Your sickness gets worse. This is very serious if: ? You are an older adult. ? Your body's defense system is weak.  You cough up blood. This information is  not intended to replace advice given to you by your health care provider. Make sure you discuss any questions you have with your health care provider. Document Released: 05/08/2008 Document Revised: 04/27/2016 Document Reviewed: 03/17/2015 Elsevier Interactive Patient Education  [employer].

## 2018-05-02 NOTE — Telephone Encounter (Signed)
Called home health nurse, Ginger, requested PTINR to be drawn tomorrow (5/31) and Monday (6/3) with results faxed to me.  PTINR results from today have resulted & per Dr Tasia Catchings, patient is to take 16mg  Coumadin today, Notified pt's son, [name].  Told son that he should hear from our office tomorrow once we receive results to advise what dose should be taken tomorrow as well.

## 2018-05-02 NOTE — Progress Notes (Addendum)
Chief Complaint  Patient presents with  . Follow-up   F/u s/p [facility] facility release with son [name] and caretaker today  1. DVT on Coumadin longer term with variable INR likely 2/2 Tegretol H/o Dr. Tasia Catchings rec lifelong anticoagulation with coumadin after speaking to her today. She will manage variable INR until goal 2-3 then primary care will take over care  INR 04/30/18 was 4.09 on Coumadin 19 mg qd and lovenox 92 bid. Lovenox was supposed to be stopped and hold Coumadin until INR repeated  She did report nose bleed over the weekend but then found out INR too high   2. CT chest 04/04/18 bronchiectasis, mucus and pneumonia pt completed doxycycline she is still coughing clear mucous on mucinex otc cough did wake her up out of sleep recently. She also had h/o COPD and spiriva and Symbicort. O2 sat is 91% today. She is not running fever at home  3. Iron def anemia on iron tablet advised to avoid x 4 hours of levothyroxine use  4. Hypothyroidism uncontrolled for now will repeat TSH at f/u. On levo 200 mcg qd except for Sundays  5. Physical deconditioning [facility], RN coming to home now will continue this and also home RN until pt gets INR machine for home use and INR in therapeutic range 2-3     Review of Systems  Constitutional: Negative for fever.  HENT: Positive for hearing loss.   Eyes:       Eyes red and itchy  Respiratory: Positive for cough and sputum production. Negative for shortness of breath.   Cardiovascular: Negative for chest pain.  Gastrointestinal: Negative for abdominal pain.  Musculoskeletal: Negative for falls.  Skin: Negative for rash.  Neurological: Negative for headaches.  Psychiatric/Behavioral: Negative for depression.   Past Medical History:  Diagnosis Date  . Acoustic neuroma ([place])   . Allergy   . Asthma   . Bilateral swelling of feet    and legs  . Bladder infection   . CAD (coronary artery disease)   . Cataract   . Change in voice   . Compression  fracture of body of thoracic vertebra (HCC)    T12 09/18/15 MRI s/p fall   . Constipation   . COPD (chronic obstructive pulmonary disease) ([place])    previous CXR with chronic interstitial lung dz   . CVA (cerebral vascular accident) ([place])   . Depression   . Diabetes ([place])    with neuropathy  . Diabetes mellitus, type 2 ([place])   . Diarrhea   . Double vision   . DVT (deep venous thrombosis) ([place])    right leg 10/2015 was on coumadin off as of 2017/2018 ; s/p IVC filter  . Enuresis   . Eye pain, right   . Fall   . Fatty liver    09/15/15 also mildly dilated pancreatitic duct rec MRCP small sub cm cyst hemangioma speeln mild right hydronephrorossi and prox. hydroureter, kidney stones, mild scarring kidneys  . Female stress incontinence   . Flank pain   . GERD (gastroesophageal reflux disease)    with small hiatal hernia   . Hard of hearing   . Heart disease   . History of kidney problems   . Hyperlipidemia    mixed  . Hypertension   . Hypothyroidism, postsurgical   . Impaired mobility and ADLs    uses rolling walker has caretaker 24/7 at home  . Leg edema   . Mixed incontinence urge and stress (female)(female)   .  Neuropathy   . Osteoarthritis    DDD spine   . Osteoporosis with fracture    T12 compression fracture  . Photophobia   . Pulmonary embolism ([place])    10/2015 off coumadin as of 04/2016  . Pulmonary HTN (HCC)    mild pulm HTN, echo 10/09/15 EF 77-93%JQZES 1 dd, RV systolic pressure increased   . Recurrent UTI   . Sinus pressure   . Skin cancer    BCC jawline and scalp   . Thyroid disease    follows [facility]  . TIA (transient ischemic attack)    MRI 2009/2010 neg stroke   . Trigeminal neuralgia    Dr. Tomi Bamberger s/p gamma knife x 2, on Tegretol since 2011/2012 no increase in dose >200 mg bid rec per family per neurology   . Urinary frequency   . Urinary, incontinence, stress female    Dr Erlene Quan urology    Past Surgical History:  Procedure Laterality Date   . APPENDECTOMY     as a child, open  . BRAIN SURGERY     schwnnoma removal 1996   . brain tumor surgery    . BREAST SURGERY     breast bx  . CATARACT EXTRACTION    . CHOLECYSTECTOMY    . EYE SURGERY     cataract  . IVC FILTER PLACEMENT ([facility] HX)     Dr. Lucky Cowboy 10/2015   . LAPAROSCOPIC TUBAL LIGATION    . MOHS SURGERY     scalp 04/2014   . PERIPHERAL VASCULAR CATHETERIZATION N/A 10/11/2015   Procedure: IVC Filter Insertion;  Surgeon: Algernon Huxley, MD;  Location: [facility];  Service: Cardiovascular;  Laterality: N/A;  . PERIPHERAL VASCULAR THROMBECTOMY Bilateral 03/29/2018   Procedure: PERIPHERAL VASCULAR THROMBECTOMY;  Surgeon: Algernon Huxley, MD;  Location: [facility];  Service: Cardiovascular;  Laterality: Bilateral;  . PUBOVAGINAL SLING    . THROAT SURGERY    . THYROID SURGERY     tumor around vocal cords   . TOOTH EXTRACTION     winter 2018   . TOTAL THYROIDECTOMY  1976   Family History  Problem Relation Age of Onset  . Heart disease Mother   . Diabetes Father   . Cancer Daughter        breast ca x 2 s/p mastectomy    Social History   Socioeconomic History  . Marital status: Widowed    Spouse name: Not on file  . Number of children: 5  . Years of education: Not on file  . Highest education level: Not on file  Occupational History  . Occupation: retired  Scientific laboratory technician  . Financial resource strain: Not on file  . Food insecurity:    Worry: Not on file    Inability: Not on file  . Transportation needs:    Medical: Not on file    Non-medical: Not on file  Tobacco Use  . Smoking status: Former Smoker    Types: Cigarettes    Last attempt to quit: 09/20/1995    Years since quitting: 22.6  . Smokeless tobacco: Never Used  . Tobacco comment: quit 1996 smoked 20 years max 8 cig qd   Substance and Sexual Activity  . Alcohol use: No  . Drug use: No  . Sexual activity: Not on file  Lifestyle  . Physical activity:    Days per week: Not on file     Minutes per session: Not on file  . Stress:  Not on file  Relationships  . Social connections:    Talks on phone: Not on file    Gets together: Not on file    Attends religious service: Not on file    Active member of club or organization: Not on file    Attends meetings of clubs or organizations: Not on file    Relationship status: Not on file  . Intimate partner violence:    Fear of current or ex partner: Not on file    Emotionally abused: Not on file    Physically abused: Not on file    Forced sexual activity: Not on file  Other Topics Concern  . Not on file  Social History Narrative  . Not on file   Current Meds  Medication Sig  . acetaminophen (TYLENOL) 325 MG tablet Take 650 mg by mouth every 6 (six) hours as needed.  . Amino Acids-Protein Hydrolys (FEEDING SUPPLEMENT, PRO-STAT SUGAR FREE 64,) LIQD Take 30 mLs by mouth 2 (two) times daily between meals.  Marland Kitchen atorvastatin (LIPITOR) 40 MG tablet Take 1 tablet (40 mg total) by mouth daily at 6 PM. Generic ok  . bisacodyl (DULCOLAX) 5 MG EC tablet Take 1 tablet (5 mg total) by mouth daily as needed for moderate constipation.  . budesonide-formoterol (SYMBICORT) 160-4.5 MCG/ACT inhaler Inhale 2 puffs into the lungs 2 (two) times daily. Rinse mouth  . Calcium Carbonate-Vitamin D (CALCIUM 600+D) 600-400 MG-UNIT tablet Take 1 tablet by mouth 2 (two) times daily. Lunch and dinner  . carbamazepine (TEGRETOL) 200 MG tablet Take 1 tablet (200 mg total) by mouth 2 (two) times daily.  Marland Kitchen docusate sodium (COLACE) 100 MG capsule Take 1 capsule (100 mg total) by mouth 2 (two) times daily as needed for mild constipation.  . ferrous sulfate 325 (65 FE) MG tablet Take 1 tablet (325 mg total) by mouth daily after lunch.  . fluticasone (FLONASE) 50 MCG/ACT nasal spray Place 1-2 sprays into both nostrils daily. Max 2 sprays  . furosemide (LASIX) 40 MG tablet Take 1 tablet (40 mg total) by mouth daily.  Marland Kitchen gabapentin (NEURONTIN) 100 MG capsule Take 1  capsule (100 mg total) by mouth 2 (two) times daily.  Marland Kitchen guaiFENesin (MUCINEX) 600 MG 12 hr tablet Take 600 mg by mouth daily.   Marland Kitchen HYDROcodone-acetaminophen (NORCO/VICODIN) 5-325 MG tablet Take 1 tablet by mouth every 6 (six) hours as needed for moderate pain or severe pain.  Marland Kitchen levothyroxine (SYNTHROID, LEVOTHROID) 200 MCG tablet Take 1 tablet (200 mcg total) by mouth daily before breakfast. Except on Sunday. Do not take with other medications or vitamins  . lidocaine (LIDODERM) 5 % Place 1 patch onto the skin daily. Remove & Discard patch within 12 hours or as directed by MD  . metFORMIN (GLUCOPHAGE-XR) 500 MG 24 hr tablet Take 1 tablet (500 mg total) by mouth daily with breakfast.  . Multiple Vitamin (MULTIVITAMIN WITH MINERALS) TABS tablet Take 1 tablet by mouth daily.  . Nutritional Supplements (ENSURE ENLIVE PO) Take 1 Bottle by mouth 2 (two) times daily.  . ondansetron (ZOFRAN) 4 MG tablet Take 1 tablet (4 mg total) by mouth 2 (two) times daily as needed for nausea or vomiting.  . pantoprazole (PROTONIX) 40 MG tablet Take 1 tablet (40 mg total) by mouth daily. 30 minutes before lunch or dinner  . polyethylene glycol powder (GLYCOLAX/MIRALAX) powder Take 17 g by mouth daily as needed for mild constipation.  . polyvinyl alcohol (LIQUIFILM TEARS) 1.4 % ophthalmic solution Place 1  drop into both eyes as needed for dry eyes.  Marland Kitchen senna-docusate (SENOKOT-S) 8.6-50 MG tablet Take 1 tablet by mouth at bedtime as needed for mild constipation.  Marland Kitchen tiotropium (SPIRIVA) 18 MCG inhalation capsule Place 1 capsule (18 mcg total) into inhaler and inhale daily.  . traZODone (DESYREL) 50 MG tablet Take 0.5 tablets (25 mg total) by mouth at bedtime as needed for sleep.  Marland Kitchen warfarin (COUMADIN) 10 MG tablet Total 18 mg qpm (3 mg +3+2 + 10 mg qd)  . warfarin (COUMADIN) 2 MG tablet Take 1 tablet (2 mg total) by mouth daily. Total 18 mg qd 10 mg + 3 mg +93m +2 mg  . warfarin (COUMADIN) 3 MG tablet Total 18 mg per day (  3+3+2 mg + 10 mg daily)  . [DISCONTINUED] albuterol (PROVENTIL) (2.5 MG/3ML) 0.083% nebulizer solution Take 3 mLs (2.5 mg total) by nebulization 3 (three) times daily.   Allergies  Allergen Reactions  . Penicillins Shortness Of Breath, Rash and Other (See Comments)    Has patient had a PCN reaction causing immediate rash, facial/tongue/throat swelling, SOB or lightheadedness with hypotension: Yes Has patient had a PCN reaction causing severe rash involving mucus membranes or skin necrosis: No Has patient had a PCN reaction that required hospitalization No Has patient had a PCN reaction occurring within the last 10 years: No If all of the above answers are "NO", then may proceed with Cephalosporin use.  . Sulfa Antibiotics Shortness Of Breath, Rash and Other (See Comments)  . Amitiza [Lubiprostone]     N/v/d  . Aspirin Other (See Comments)    Reaction:  Unknown  Other reaction(s): Bleeding (intolerance) Per patient " causes nose to bleed" Can take 81 mg daily without any complications Other reaction(s): "bloody nose"   . Penicillin G Other (See Comments)    Has patient had a PCN reaction causing immediate rash, facial/tongue/throat swelling, SOB or lightheadedness with hypotension: No Has patient had a PCN reaction causing severe rash involving mucus membranes or skin necrosis: Unknown Has patient had a PCN reaction that required hospitalization: Unknown Has patient had a PCN reaction occurring within the last 10 years: Unknown If all of the above answers are "NO", then may proceed with Cephalosporin use.   Recent Results (from the past 2160 hour(s))  Comprehensive metabolic panel     Status: Abnormal   Collection Time: 03/03/18  4:30 PM  Result Value Ref Range   Sodium 140 135 - 145 mmol/L   Potassium 4.5 3.5 - 5.1 mmol/L   Chloride 104 101 - 111 mmol/L   CO2 26 22 - 32 mmol/L   Glucose, Bld 135 (H) 65 - 99 mg/dL   BUN 20 6 - 20 mg/dL   Creatinine, Ser 1.16 (H) 0.44 - 1.00 mg/dL    Calcium 8.8 (L) 8.9 - 10.3 mg/dL   Total Protein 7.1 6.5 - 8.1 g/dL   Albumin 3.7 3.5 - 5.0 g/dL   AST 26 15 - 41 U/L   ALT 19 14 - 54 U/L   Alkaline Phosphatase 65 38 - 126 U/L   Total Bilirubin 0.4 0.3 - 1.2 mg/dL   GFR calc non Af Amer 40 (L) >60 mL/min   GFR calc Af Amer 47 (L) >60 mL/min    Comment: (NOTE) The eGFR has been calculated using the CKD EPI equation. This calculation has not been validated in all clinical situations. eGFR's persistently <60 mL/min signify possible Chronic Kidney Disease.    Anion gap 10 5 -  15    Comment: Performed at [facility], [address] [place], [place] [address]  CBC with Differential     Status: None   Collection Time: 03/03/18  4:30 PM  Result Value Ref Range   WBC 7.3 3.6 - 11.0 K/uL   RBC 4.46 3.80 - 5.20 MIL/uL   Hemoglobin 13.8 12.0 - 16.0 g/dL   HCT 42.7 35.0 - 47.0 %   MCV 95.7 80.0 - 100.0 fL   MCH 30.9 26.0 - 34.0 pg   MCHC 32.3 32.0 - 36.0 g/dL   RDW 14.2 11.5 - 14.5 %   Platelets 198 150 - 440 K/uL   Neutrophils Relative % 71 %   Neutro Abs 5.2 1.4 - 6.5 K/uL   Lymphocytes Relative 19 %   Lymphs Abs 1.4 1.0 - 3.6 K/uL   Monocytes Relative 8 %   Monocytes Absolute 0.6 0.2 - 0.9 K/uL   Eosinophils Relative 2 %   Eosinophils Absolute 0.1 0 - 0.7 K/uL   Basophils Relative 0 %   Basophils Absolute 0.0 0 - 0.1 K/uL    Comment: Performed at [facility], [address] [place], [place] [address]  Lipase, blood     Status: None   Collection Time: 03/03/18  4:30 PM  Result Value Ref Range   Lipase 30 11 - 51 U/L    Comment: Performed at [facility], [address] [place], [place] [address]  Urinalysis, Complete w Microscopic     Status: Abnormal   Collection Time: 03/03/18  5:03 PM  Result Value Ref Range   Color, Urine AMBER (A) YELLOW    Comment: BIOCHEMICALS MAY BE AFFECTED BY COLOR   APPearance HAZY (A) CLEAR   Specific Gravity, Urine 1.024 1.005 - 1.030   pH 5.0 5.0 - 8.0    Glucose, UA NEGATIVE NEGATIVE mg/dL   Hgb urine dipstick NEGATIVE NEGATIVE   Bilirubin Urine NEGATIVE NEGATIVE   Ketones, ur NEGATIVE NEGATIVE mg/dL   Protein, ur 30 (A) NEGATIVE mg/dL   Nitrite NEGATIVE NEGATIVE   Leukocytes, UA NEGATIVE NEGATIVE   RBC / HPF 0-5 0 - 5 RBC/hpf   WBC, UA 0-5 0 - 5 WBC/hpf   Bacteria, UA NONE SEEN NONE SEEN   Squamous Epithelial / LPF 0-5 (A) NONE SEEN    Comment: Performed at [facility], [address] [place], [place] [address]  Carbamazepine level, total     Status: None   Collection Time: 03/03/18  5:40 PM  Result Value Ref Range   Carbamazepine Lvl 6.6 4.0 - 12.0 ug/mL    Comment: Performed at [facility], [address] [place], [place] [address]  Carbamazepine level, total     Status: None   Collection Time: 03/04/18  7:55 PM  Result Value Ref Range   Carbamazepine Lvl 8.4 4.0 - 12.0 ug/mL    Comment: Performed at [facility], [address] [place], [place] [address]  Basic metabolic panel     Status: Abnormal   Collection Time: 03/04/18  7:55 PM  Result Value Ref Range   Sodium 138 135 - 145 mmol/L   Potassium 4.3 3.5 - 5.1 mmol/L   Chloride 107 101 - 111 mmol/L   CO2 26 22 - 32 mmol/L   Glucose, Bld 139 (H) 65 - 99 mg/dL   BUN 20 6 - 20 mg/dL   Creatinine, Ser 1.09 (H) 0.44 - 1.00 mg/dL   Calcium 8.1 (L) 8.9 - 10.3  mg/dL   GFR calc non Af Amer 44 (L) >60 mL/min   GFR calc Af Amer 51 (L) >60 mL/min    Comment: (NOTE) The eGFR has been calculated using the CKD EPI equation. This calculation has not been validated in all clinical situations. eGFR's persistently <60 mL/min signify possible Chronic Kidney Disease.    Anion gap 5 5 - 15    Comment: Performed at [facility], [address] [place], [place] [address]  CBC with Differential     Status: Abnormal   Collection Time: 03/04/18  7:55 PM  Result Value Ref Range   WBC 7.5 3.6 - 11.0 K/uL   RBC 4.07 3.80 - 5.20 MIL/uL   Hemoglobin  12.6 12.0 - 16.0 g/dL   HCT 38.7 35.0 - 47.0 %   MCV 95.0 80.0 - 100.0 fL   MCH 31.0 26.0 - 34.0 pg   MCHC 32.7 32.0 - 36.0 g/dL   RDW 14.2 11.5 - 14.5 %   Platelets 126 (L) 150 - 440 K/uL   Neutrophils Relative % 74 %   Neutro Abs 5.5 1.4 - 6.5 K/uL   Lymphocytes Relative 15 %   Lymphs Abs 1.1 1.0 - 3.6 K/uL   Monocytes Relative 10 %   Monocytes Absolute 0.7 0.2 - 0.9 K/uL   Eosinophils Relative 1 %   Eosinophils Absolute 0.1 0 - 0.7 K/uL   Basophils Relative 0 %   Basophils Absolute 0.0 0 - 0.1 K/uL    Comment: Performed at [facility], [address] [place], [place] [address]  Troponin I     Status: None   Collection Time: 03/04/18  7:55 PM  Result Value Ref Range   Troponin I <0.03 <0.03 ng/mL    Comment: Performed at [facility], [address] [place], [place] [address]  Troponin I     Status: None   Collection Time: 03/05/18  1:27 AM  Result Value Ref Range   Troponin I <0.03 <0.03 ng/mL    Comment: Performed at [facility], [address] [place], [place] [address]  Basic metabolic panel     Status: Abnormal   Collection Time: 03/05/18  1:27 AM  Result Value Ref Range   Sodium 139 135 - 145 mmol/L   Potassium 4.6 3.5 - 5.1 mmol/L   Chloride 108 101 - 111 mmol/L   CO2 25 22 - 32 mmol/L   Glucose, Bld 136 (H) 65 - 99 mg/dL   BUN 20 6 - 20 mg/dL   Creatinine, Ser 0.96 0.44 - 1.00 mg/dL   Calcium 8.3 (L) 8.9 - 10.3 mg/dL   GFR calc non Af Amer 51 (L) >60 mL/min   GFR calc Af Amer 59 (L) >60 mL/min    Comment: (NOTE) The eGFR has been calculated using the CKD EPI equation. This calculation has not been validated in all clinical situations. eGFR's persistently <60 mL/min signify possible Chronic Kidney Disease.    Anion gap 6 5 - 15    Comment: Performed at [facility], [address] [place], [place] [address]  CBC     Status: Abnormal   Collection Time: 03/05/18  1:27 AM  Result Value Ref Range   WBC 8.3 3.6 -  11.0 K/uL   RBC 4.15 3.80 - 5.20 MIL/uL   Hemoglobin 12.8 12.0 - 16.0 g/dL   HCT 39.2 35.0 - 47.0 %   MCV 94.5 80.0 - 100.0 fL   MCH 30.8 26.0 - 34.0  pg   MCHC 32.6 32.0 - 36.0 g/dL   RDW 14.3 11.5 - 14.5 %   Platelets 127 (L) 150 - 440 K/uL    Comment: Performed at [facility] [address] [place], [place] [address]  Glucose, capillary     Status: Abnormal   Collection Time: 03/05/18  7:28 AM  Result Value Ref Range   Glucose-Capillary 126 (H) 65 - 99 mg/dL  CBC with Differential     Status: Abnormal   Collection Time: 03/05/18  8:30 PM  Result Value Ref Range   WBC 8.5 3.6 - 11.0 K/uL   RBC 4.27 3.80 - 5.20 MIL/uL   Hemoglobin 13.3 12.0 - 16.0 g/dL   HCT 40.5 35.0 - 47.0 %   MCV 94.8 80.0 - 100.0 fL   MCH 31.1 26.0 - 34.0 pg   MCHC 32.8 32.0 - 36.0 g/dL   RDW 14.4 11.5 - 14.5 %   Platelets 108 (L) 150 - 440 K/uL   Neutrophils Relative % 77 %   Neutro Abs 6.6 (H) 1.4 - 6.5 K/uL   Lymphocytes Relative 12 %   Lymphs Abs 1.0 1.0 - 3.6 K/uL   Monocytes Relative 10 %   Monocytes Absolute 0.9 0.2 - 0.9 K/uL   Eosinophils Relative 1 %   Eosinophils Absolute 0.0 0 - 0.7 K/uL   Basophils Relative 0 %   Basophils Absolute 0.0 0 - 0.1 K/uL    Comment: Performed at [facility], [address] [place], [place] [address]  Basic metabolic panel     Status: Abnormal   Collection Time: 03/05/18  8:30 PM  Result Value Ref Range   Sodium 137 135 - 145 mmol/L   Potassium 4.3 3.5 - 5.1 mmol/L   Chloride 107 101 - 111 mmol/L   CO2 23 22 - 32 mmol/L   Glucose, Bld 175 (H) 65 - 99 mg/dL   BUN 23 (H) 6 - 20 mg/dL   Creatinine, Ser 1.14 (H) 0.44 - 1.00 mg/dL   Calcium 7.8 (L) 8.9 - 10.3 mg/dL   GFR calc non Af Amer 41 (L) >60 mL/min   GFR calc Af Amer 48 (L) >60 mL/min    Comment: (NOTE) The eGFR has been calculated using the CKD EPI equation. This calculation has not been validated in all clinical situations. eGFR's persistently <60 mL/min signify possible  Chronic Kidney Disease.    Anion gap 7 5 - 15    Comment: Performed at [facility], [address] [place], [place] [address]  Troponin I     Status: None   Collection Time: 03/05/18  8:30 PM  Result Value Ref Range   Troponin I <0.03 <0.03 ng/mL    Comment: Performed at [facility], [address] [place], [place] [address]  Basic metabolic panel     Status: Abnormal   Collection Time: 03/06/18  3:58 AM  Result Value Ref Range   Sodium 138 135 - 145 mmol/L   Potassium 4.3 3.5 - 5.1 mmol/L   Chloride 109 101 - 111 mmol/L   CO2 23 22 - 32 mmol/L   Glucose, Bld 125 (H) 65 - 99 mg/dL   BUN 20 6 - 20 mg/dL   Creatinine, Ser 0.94 0.44 - 1.00 mg/dL   Calcium 8.0 (L) 8.9 - 10.3 mg/dL   GFR calc non Af Amer 52 (L) >60 mL/min   GFR calc Af Amer >60 >60 mL/min    Comment: (NOTE) The eGFR has  been calculated using the CKD EPI equation. This calculation has not been validated in all clinical situations. eGFR's persistently <60 mL/min signify possible Chronic Kidney Disease.    Anion gap 6 5 - 15    Comment: Performed at [facility], [address] [place], [place] [address]  CBC     Status: Abnormal   Collection Time: 03/06/18  3:58 AM  Result Value Ref Range   WBC 7.6 3.6 - 11.0 K/uL   RBC 3.85 3.80 - 5.20 MIL/uL   Hemoglobin 11.8 (L) 12.0 - 16.0 g/dL   HCT 36.3 35.0 - 47.0 %   MCV 94.3 80.0 - 100.0 fL   MCH 30.6 26.0 - 34.0 pg   MCHC 32.4 32.0 - 36.0 g/dL   RDW 14.1 11.5 - 14.5 %   Platelets 101 (L) 150 - 440 K/uL    Comment: Performed at [facility], [address] [place], [place] [address]  Glucose, capillary     Status: Abnormal   Collection Time: 03/06/18  8:07 AM  Result Value Ref Range   Glucose-Capillary 121 (H) 65 - 99 mg/dL   Comment 1 Notify RN   Glucose, capillary     Status: Abnormal   Collection Time: 03/06/18 11:49 AM  Result Value Ref Range   Glucose-Capillary 146 (H) 65 - 99 mg/dL   Comment 1 Notify RN    Glucose, capillary     Status: Abnormal   Collection Time: 03/06/18  4:40 PM  Result Value Ref Range   Glucose-Capillary 117 (H) 65 - 99 mg/dL   Comment 1 Notify RN   Glucose, capillary     Status: Abnormal   Collection Time: 03/06/18  8:32 PM  Result Value Ref Range   Glucose-Capillary 138 (H) 65 - 99 mg/dL  Glucose, capillary     Status: Abnormal   Collection Time: 03/07/18  8:07 AM  Result Value Ref Range   Glucose-Capillary 126 (H) 65 - 99 mg/dL   Comment 1 Notify RN   Glucose, capillary     Status: Abnormal   Collection Time: 03/07/18 11:38 AM  Result Value Ref Range   Glucose-Capillary 128 (H) 65 - 99 mg/dL   Comment 1 Notify RN   Glucose, capillary     Status: Abnormal   Collection Time: 03/07/18  5:14 PM  Result Value Ref Range   Glucose-Capillary 135 (H) 65 - 99 mg/dL   Comment 1 Notify RN   Glucose, capillary     Status: Abnormal   Collection Time: 03/07/18  9:25 PM  Result Value Ref Range   Glucose-Capillary 137 (H) 65 - 99 mg/dL   Comment 1 Notify RN    Comment 2 Document in Chart   Glucose, capillary     Status: Abnormal   Collection Time: 03/08/18  8:07 AM  Result Value Ref Range   Glucose-Capillary 128 (H) 65 - 99 mg/dL   Comment 1 Notify RN   Glucose, capillary     Status: Abnormal   Collection Time: 03/08/18 12:16 PM  Result Value Ref Range   Glucose-Capillary 137 (H) 65 - 99 mg/dL   Comment 1 Notify RN   CBC with Differential/Platelet     Status: Abnormal   Collection Time: 03/15/18  5:28 AM  Result Value Ref Range   WBC 8.2 3.6 - 11.0 K/uL   RBC 3.39 (L) 3.80 - 5.20 MIL/uL   Hemoglobin 10.8 (L) 12.0 - 16.0 g/dL   HCT 32.2 (L) 35.0 - 47.0 %  MCV 95.0 80.0 - 100.0 fL   MCH 31.9 26.0 - 34.0 pg   MCHC 33.6 32.0 - 36.0 g/dL   RDW 13.8 11.5 - 14.5 %   Platelets 225 150 - 440 K/uL   Neutrophils Relative % 70 %   Lymphocytes Relative 15 %   Monocytes Relative 11 %   Eosinophils Relative 3 %   Basophils Relative 1 %   Neutro Abs 5.8 1.4 - 6.5 K/uL    Lymphs Abs 1.2 1.0 - 3.6 K/uL   Monocytes Absolute 0.9 0.2 - 0.9 K/uL   Eosinophils Absolute 0.2 0 - 0.7 K/uL   Basophils Absolute 0.1 0 - 0.1 K/uL   RBC Morphology MIXED RBC POPULATION     Comment: POLYCHROMASIA PRESENT   WBC Morphology MILD LEFT SHIFT (1-5% METAS, OCC MYELO, OCC BANDS)    Smear Review      PLATELET CLUMPS NOTED ON SMEAR, COUNT APPEARS ADEQUATE    Comment: Performed at [facility], [address] [place], [place] [address]  Comprehensive metabolic panel     Status: Abnormal   Collection Time: 03/15/18  5:28 AM  Result Value Ref Range   Sodium 137 135 - 145 mmol/L   Potassium 4.5 3.5 - 5.1 mmol/L   Chloride 102 101 - 111 mmol/L   CO2 27 22 - 32 mmol/L   Glucose, Bld 105 (H) 65 - 99 mg/dL   BUN 14 6 - 20 mg/dL   Creatinine, Ser 1.03 (H) 0.44 - 1.00 mg/dL   Calcium 8.4 (L) 8.9 - 10.3 mg/dL   Total Protein 6.1 (L) 6.5 - 8.1 g/dL   Albumin 2.5 (L) 3.5 - 5.0 g/dL   AST 29 15 - 41 U/L   ALT 29 14 - 54 U/L   Alkaline Phosphatase 78 38 - 126 U/L   Total Bilirubin 0.4 0.3 - 1.2 mg/dL   GFR calc non Af Amer 47 (L) >60 mL/min   GFR calc Af Amer 54 (L) >60 mL/min    Comment: (NOTE) The eGFR has been calculated using the CKD EPI equation. This calculation has not been validated in all clinical situations. eGFR's persistently <60 mL/min signify possible Chronic Kidney Disease.    Anion gap 8 5 - 15    Comment: Performed at [facility], [address] [place], [place] [address]  Lipid panel     Status: None   Collection Time: 03/15/18  5:28 AM  Result Value Ref Range   Cholesterol 144 0 - 200 mg/dL   Triglycerides 83 <150 mg/dL   HDL 41 >40 mg/dL   Total CHOL/HDL Ratio 3.5 RATIO   VLDL 17 0 - 40 mg/dL   LDL Cholesterol 86 0 - 99 mg/dL    Comment:        Total Cholesterol/HDL:CHD Risk Coronary Heart Disease Risk Table                     Men   Women  1/2 Average Risk   3.4   3.3  Average Risk       5.0   4.4  2 X Average Risk   9.6    7.1  3 X Average Risk  23.4   11.0        Use the calculated Patient Ratio above and the CHD Risk Table to determine the patient's CHD Risk.        ATP III CLASSIFICATION (LDL):  <100     mg/dL   Optimal  100-129  mg/dL   Near or Above                    Optimal  130-159  mg/dL   Borderline  160-189  mg/dL   High  >190     mg/dL   Very High Performed at [facility], [address] [place], [place] [address]   Magnesium     Status: None   Collection Time: 03/15/18  5:28 AM  Result Value Ref Range   Magnesium 1.8 1.7 - 2.4 mg/dL    Comment: Performed at [facility], [address] [place], [place] [address]  TSH     Status: None   Collection Time: 03/15/18  5:28 AM  Result Value Ref Range   TSH 3.333 0.350 - 4.500 uIU/mL    Comment: Performed by a 3rd Generation assay with a functional sensitivity of <=0.01 uIU/mL. Performed at [facility], [address] [place], [place] [address]   Vitamin B12     Status: None   Collection Time: 03/15/18  5:28 AM  Result Value Ref Range   Vitamin B-12 716 180 - 914 pg/mL    Comment: (NOTE) This assay is not validated for testing neonatal or myeloproliferative syndrome specimens for Vitamin B12 levels. Performed at [facility], [place] [address] [place], [place] [address]   VITAMIN D 25 Hydroxy (Vit-D Deficiency, Fractures)     Status: None   Collection Time: 03/15/18  5:28 AM  Result Value Ref Range   Vit D, 25-Hydroxy 48.1 30.0 - 100.0 ng/mL    Comment: (NOTE) Vitamin D deficiency has been defined by the [place] practice guideline as a level of serum 25-OH vitamin D less than 20 ng/mL (1,2). The Endocrine Society went on to further define vitamin D insufficiency as a level between 21 and 29 ng/mL (2). 1. IOM (Institute of Medicine). 2010. Dietary reference   intakes for calcium and D. [place]: The   [employer]. 2. Holick MF, Binkley  [place], Bischoff-Ferrari HA, et al.   Evaluation, treatment, and prevention of vitamin D   deficiency: an Endocrine Society clinical practice   guideline. JCEM. 2011 Jul; 96(7):1911-30. Performed At: [facility] [place], [place] [id number] Rush Farmer MD [id number] Performed at [facility], [address] [place], [place] [address]   CBC     Status: Abnormal   Collection Time: 03/27/18  6:15 PM  Result Value Ref Range   WBC 8.2 3.6 - 11.0 K/uL   RBC 3.80 3.80 - 5.20 MIL/uL   Hemoglobin 12.3 12.0 - 16.0 g/dL   HCT 35.8 35.0 - 47.0 %   MCV 94.2 80.0 - 100.0 fL   MCH 32.5 26.0 - 34.0 pg   MCHC 34.5 32.0 - 36.0 g/dL   RDW 15.1 (H) 11.5 - 14.5 %   Platelets 196 150 - 440 K/uL    Comment: Performed at [facility], [address] [place], [place] [address]  Comprehensive metabolic panel     Status: Abnormal   Collection Time: 03/27/18  6:15 PM  Result Value Ref Range   Sodium 139 135 - 145 mmol/L   Potassium 4.0 3.5 - 5.1 mmol/L   Chloride 100 (L) 101 - 111 mmol/L   CO2 32 22 - 32 mmol/L   Glucose, Bld 113 (H) 65 - 99 mg/dL   BUN 20 6 - 20 mg/dL   Creatinine,  Ser 1.15 (H) 0.44 - 1.00 mg/dL   Calcium 8.5 (L) 8.9 - 10.3 mg/dL   Total Protein 6.9 6.5 - 8.1 g/dL   Albumin 3.4 (L) 3.5 - 5.0 g/dL   AST 25 15 - 41 U/L   ALT 13 (L) 14 - 54 U/L   Alkaline Phosphatase 81 38 - 126 U/L   Total Bilirubin 0.3 0.3 - 1.2 mg/dL   GFR calc non Af Amer 41 (L) >60 mL/min   GFR calc Af Amer 47 (L) >60 mL/min    Comment: (NOTE) The eGFR has been calculated using the CKD EPI equation. This calculation has not been validated in all clinical situations. eGFR's persistently <60 mL/min signify possible Chronic Kidney Disease.    Anion gap 7 5 - 15    Comment: Performed at [facility], [address] [place], [place] [address]  Troponin I     Status: None   Collection Time: 03/27/18  6:15 PM  Result Value Ref Range   Troponin I <0.03  <0.03 ng/mL    Comment: Performed at [facility], [address] [place], [place] [address]  Protime-INR     Status: None   Collection Time: 03/27/18  6:15 PM  Result Value Ref Range   Prothrombin Time 13.3 11.4 - 15.2 seconds   INR 1.02     Comment: Performed at [facility], [address] [place], [place] [address]  APTT     Status: None   Collection Time: 03/27/18  6:15 PM  Result Value Ref Range   aPTT 27 24 - 36 seconds    Comment: Performed at [facility], [address] [place], [place] [address]  Brain natriuretic peptide     Status: None   Collection Time: 03/27/18  8:21 PM  Result Value Ref Range   B Natriuretic Peptide 49.0 0.0 - 100.0 pg/mL    Comment: Performed at [facility], [address] [place], [place] [address]  Glucose, capillary     Status: None   Collection Time: 03/27/18  9:58 PM  Result Value Ref Range   Glucose-Capillary 91 65 - 99 mg/dL  Heparin level (unfractionated)     Status: Abnormal   Collection Time: 03/28/18  4:14 AM  Result Value Ref Range   Heparin Unfractionated 1.28 (H) 0.30 - 0.70 IU/mL    Comment:        IF HEPARIN RESULTS ARE BELOW EXPECTED VALUES, AND PATIENT DOSAGE HAS BEEN CONFIRMED, SUGGEST FOLLOW UP TESTING OF ANTITHROMBIN III LEVELS. Performed at [facility], [address] [place],  [address]   Basic metabolic panel     Status: Abnormal   Collection Time: 03/28/18  4:14 AM  Result Value Ref Range   Sodium 139 135 - 145 mmol/L   Potassium 3.2 (L) 3.5 - 5.1 mmol/L   Chloride 102 101 - 111 mmol/L   CO2 30 22 - 32 mmol/L   Glucose, Bld 104 (H) 65 - 99 mg/dL   BUN 19 6 - 20 mg/dL   Creatinine, Ser 1.04 (H) 0.44 - 1.00 mg/dL   Calcium 8.0 (L) 8.9 - 10.3 mg/dL   GFR calc non Af Amer 46 (L) >60 mL/min   GFR calc Af Amer 54 (L) >60 mL/min    Comment: (NOTE) The eGFR has been calculated using the CKD EPI equation. This calculation has not been validated in all  clinical situations. eGFR's persistently <60 mL/min signify possible Chronic Kidney Disease.  Anion gap 7 5 - 15    Comment: Performed at [facility], [address] [place], [place] [address]  CBC     Status: Abnormal   Collection Time: 03/28/18  4:14 AM  Result Value Ref Range   WBC 6.8 3.6 - 11.0 K/uL   RBC 3.57 (L) 3.80 - 5.20 MIL/uL   Hemoglobin 11.3 (L) 12.0 - 16.0 g/dL   HCT 33.4 (L) 35.0 - 47.0 %   MCV 93.6 80.0 - 100.0 fL   MCH 31.7 26.0 - 34.0 pg   MCHC 33.8 32.0 - 36.0 g/dL   RDW 15.0 (H) 11.5 - 14.5 %   Platelets 178 150 - 440 K/uL    Comment: Performed at [facility], [address] [place], [place] [address]  Glucose, capillary     Status: Abnormal   Collection Time: 03/28/18  7:49 AM  Result Value Ref Range   Glucose-Capillary 101 (H) 65 - 99 mg/dL   Comment 1 Notify RN   Glucose, capillary     Status: None   Collection Time: 03/28/18 11:42 AM  Result Value Ref Range   Glucose-Capillary 90 65 - 99 mg/dL   Comment 1 Notify RN   Heparin level (unfractionated)     Status: Abnormal   Collection Time: 03/28/18  3:08 PM  Result Value Ref Range   Heparin Unfractionated 0.94 (H) 0.30 - 0.70 IU/mL    Comment:        IF HEPARIN RESULTS ARE BELOW EXPECTED VALUES, AND PATIENT DOSAGE HAS BEEN CONFIRMED, SUGGEST FOLLOW UP TESTING OF ANTITHROMBIN III LEVELS. Performed at [facility], [address] [place], [place] [address]   Glucose, capillary     Status: None   Collection Time: 03/28/18  4:55 PM  Result Value Ref Range   Glucose-Capillary 96 65 - 99 mg/dL   Comment 1 Notify RN   Glucose, capillary     Status: Abnormal   Collection Time: 03/28/18  9:20 PM  Result Value Ref Range   Glucose-Capillary 105 (H) 65 - 99 mg/dL  CBC     Status: Abnormal   Collection Time: 03/29/18  1:18 AM  Result Value Ref Range   WBC 6.4 3.6 - 11.0 K/uL   RBC 3.67 (L) 3.80 - 5.20 MIL/uL   Hemoglobin 11.5 (L) 12.0 - 16.0 g/dL   HCT 34.2 (L) 35.0  - 47.0 %   MCV 93.0 80.0 - 100.0 fL   MCH 31.4 26.0 - 34.0 pg   MCHC 33.8 32.0 - 36.0 g/dL   RDW 15.0 (H) 11.5 - 14.5 %   Platelets 187 150 - 440 K/uL    Comment: Performed at [facility], [address] [place], [place] [address]  Heparin level (unfractionated)     Status: Abnormal   Collection Time: 03/29/18  1:18 AM  Result Value Ref Range   Heparin Unfractionated 0.73 (H) 0.30 - 0.70 IU/mL    Comment:        IF HEPARIN RESULTS ARE BELOW EXPECTED VALUES, AND PATIENT DOSAGE HAS BEEN CONFIRMED, SUGGEST FOLLOW UP TESTING OF ANTITHROMBIN III LEVELS. Performed at [facility], [address], [place] [address]   Heparin level (unfractionated)     Status: Abnormal   Collection Time: 03/29/18  5:01 AM  Result Value Ref Range   Heparin Unfractionated 0.75 (H) 0.30 - 0.70 IU/mL    Comment:        IF HEPARIN RESULTS ARE BELOW EXPECTED VALUES, AND PATIENT  DOSAGE HAS BEEN CONFIRMED, SUGGEST FOLLOW UP TESTING OF ANTITHROMBIN III LEVELS. Performed at [facility], [address] [place], [place] [address]   Glucose, capillary     Status: None   Collection Time: 03/29/18  7:33 AM  Result Value Ref Range   Glucose-Capillary 97 65 - 99 mg/dL  Glucose, capillary     Status: Abnormal   Collection Time: 03/29/18 11:30 AM  Result Value Ref Range   Glucose-Capillary 105 (H) 65 - 99 mg/dL  Glucose, capillary     Status: None   Collection Time: 03/29/18 11:58 AM  Result Value Ref Range   Glucose-Capillary 87 65 - 99 mg/dL  Glucose, capillary     Status: None   Collection Time: 03/29/18  2:24 PM  Result Value Ref Range   Glucose-Capillary 86 65 - 99 mg/dL  Glucose, capillary     Status: None   Collection Time: 03/29/18  4:38 PM  Result Value Ref Range   Glucose-Capillary 77 65 - 99 mg/dL  Glucose, capillary     Status: None   Collection Time: 03/29/18  9:22 PM  Result Value Ref Range   Glucose-Capillary 98 65 - 99 mg/dL  Heparin level  (unfractionated)     Status: None   Collection Time: 03/30/18 12:09 AM  Result Value Ref Range   Heparin Unfractionated 0.43 0.30 - 0.70 IU/mL    Comment:        IF HEPARIN RESULTS ARE BELOW EXPECTED VALUES, AND PATIENT DOSAGE HAS BEEN CONFIRMED, SUGGEST FOLLOW UP TESTING OF ANTITHROMBIN III LEVELS. Performed at [facility], [address] [place], [place] [address]   Glucose, capillary     Status: None   Collection Time: 03/30/18  7:24 AM  Result Value Ref Range   Glucose-Capillary 96 65 - 99 mg/dL   Comment 1 Notify RN   CBC     Status: Abnormal   Collection Time: 03/30/18  7:45 AM  Result Value Ref Range   WBC 6.6 3.6 - 11.0 K/uL   RBC 3.27 (L) 3.80 - 5.20 MIL/uL   Hemoglobin 10.3 (L) 12.0 - 16.0 g/dL   HCT 30.7 (L) 35.0 - 47.0 %   MCV 94.0 80.0 - 100.0 fL   MCH 31.4 26.0 - 34.0 pg   MCHC 33.4 32.0 - 36.0 g/dL   RDW 15.0 (H) 11.5 - 14.5 %   Platelets 188 150 - 440 K/uL    Comment: Performed at [facility], [address] [place], [place] [address]  Basic metabolic panel     Status: Abnormal   Collection Time: 03/30/18  7:45 AM  Result Value Ref Range   Sodium 139 135 - 145 mmol/L   Potassium 3.9 3.5 - 5.1 mmol/L   Chloride 104 101 - 111 mmol/L   CO2 29 22 - 32 mmol/L   Glucose, Bld 106 (H) 65 - 99 mg/dL   BUN 16 6 - 20 mg/dL   Creatinine, Ser 1.28 (H) 0.44 - 1.00 mg/dL   Calcium 7.8 (L) 8.9 - 10.3 mg/dL   GFR calc non Af Amer 36 (L) >60 mL/min   GFR calc Af Amer 42 (L) >60 mL/min    Comment: (NOTE) The eGFR has been calculated using the CKD EPI equation. This calculation has not been validated in all clinical situations. eGFR's persistently <60 mL/min signify possible Chronic Kidney Disease.    Anion gap 6 5 - 15    Comment: Performed at [facility], [address],  [place], [place] [address]  Heparin level (unfractionated)     Status: None   Collection Time: 03/30/18  7:45 AM  Result Value Ref Range   Heparin Unfractionated  0.44 0.30 - 0.70 IU/mL    Comment:        IF HEPARIN RESULTS ARE BELOW EXPECTED VALUES, AND PATIENT DOSAGE HAS BEEN CONFIRMED, SUGGEST FOLLOW UP TESTING OF ANTITHROMBIN III LEVELS. Performed at [facility], [address] [place], [place] [address]   Glucose, capillary     Status: Abnormal   Collection Time: 03/30/18 11:40 AM  Result Value Ref Range   Glucose-Capillary 126 (H) 65 - 99 mg/dL  Glucose, capillary     Status: Abnormal   Collection Time: 03/30/18  4:46 PM  Result Value Ref Range   Glucose-Capillary 105 (H) 65 - 99 mg/dL   Comment 1 Notify RN   Glucose, capillary     Status: Abnormal   Collection Time: 03/30/18  9:23 PM  Result Value Ref Range   Glucose-Capillary 119 (H) 65 - 99 mg/dL   Comment 1 Notify RN   CBC     Status: Abnormal   Collection Time: 03/31/18  4:58 AM  Result Value Ref Range   WBC 7.0 3.6 - 11.0 K/uL   RBC 3.10 (L) 3.80 - 5.20 MIL/uL   Hemoglobin 9.9 (L) 12.0 - 16.0 g/dL   HCT 29.3 (L) 35.0 - 47.0 %   MCV 94.6 80.0 - 100.0 fL   MCH 31.8 26.0 - 34.0 pg   MCHC 33.7 32.0 - 36.0 g/dL   RDW 14.9 (H) 11.5 - 14.5 %   Platelets 205 150 - 440 K/uL    Comment: Performed at [facility], [address] [place], [place] [address]  Heparin level (unfractionated)     Status: None   Collection Time: 03/31/18  4:58 AM  Result Value Ref Range   Heparin Unfractionated 0.31 0.30 - 0.70 IU/mL    Comment:        IF HEPARIN RESULTS ARE BELOW EXPECTED VALUES, AND PATIENT DOSAGE HAS BEEN CONFIRMED, SUGGEST FOLLOW UP TESTING OF ANTITHROMBIN III LEVELS. Performed at [facility], [address] [place], [place] [address]   Protime-INR     Status: None   Collection Time: 03/31/18  4:58 AM  Result Value Ref Range   Prothrombin Time 13.7 11.4 - 15.2 seconds   INR 1.06     Comment: Performed at [facility], [address] [place], [place] [address]  Glucose, capillary     Status: Abnormal   Collection Time: 03/31/18   7:43 AM  Result Value Ref Range   Glucose-Capillary 107 (H) 65 - 99 mg/dL   Comment 1 Notify RN   Glucose, capillary     Status: Abnormal   Collection Time: 03/31/18 11:42 AM  Result Value Ref Range   Glucose-Capillary 130 (H) 65 - 99 mg/dL   Comment 1 Notify RN   Glucose, capillary     Status: Abnormal   Collection Time: 03/31/18  4:48 PM  Result Value Ref Range   Glucose-Capillary 125 (H) 65 - 99 mg/dL   Comment 1 Notify RN   Glucose, capillary     Status: Abnormal   Collection Time: 03/31/18 10:09 PM  Result Value Ref Range   Glucose-Capillary 121 (H) 65 - 99 mg/dL   Comment 1 Notify RN   Protime-INR     Status: None   Collection Time: 04/01/18  4:52 AM  Result Value Ref Range  Prothrombin Time 14.3 11.4 - 15.2 seconds   INR 1.12     Comment: Performed at [facility], [address] [place], [place] [address]  Glucose, capillary     Status: Abnormal   Collection Time: 04/01/18  7:50 AM  Result Value Ref Range   Glucose-Capillary 105 (H) 65 - 99 mg/dL   Comment 1 Notify RN   MRSA PCR Screening     Status: None   Collection Time: 04/01/18  8:09 AM  Result Value Ref Range   MRSA by PCR NEGATIVE NEGATIVE    Comment:        The GeneXpert MRSA Assay (FDA approved for NASAL specimens only), is one component of a comprehensive MRSA colonization surveillance program. It is not intended to diagnose MRSA infection nor to guide or monitor treatment for MRSA infections. Performed at [facility], [address] [place], [place] [address]   Glucose, capillary     Status: Abnormal   Collection Time: 04/01/18 11:39 AM  Result Value Ref Range   Glucose-Capillary 135 (H) 65 - 99 mg/dL   Comment 1 Notify RN   Glucose, capillary     Status: None   Collection Time: 04/01/18  5:02 PM  Result Value Ref Range   Glucose-Capillary 94 65 - 99 mg/dL  Comprehensive metabolic panel     Status: Abnormal   Collection Time: 04/03/18  9:49 PM  Result Value Ref Range    Sodium 137 135 - 145 mmol/L   Potassium 3.9 3.5 - 5.1 mmol/L   Chloride 100 (L) 101 - 111 mmol/L   CO2 29 22 - 32 mmol/L   Glucose, Bld 133 (H) 65 - 99 mg/dL   BUN 18 6 - 20 mg/dL   Creatinine, Ser 1.05 (H) 0.44 - 1.00 mg/dL   Calcium 7.9 (L) 8.9 - 10.3 mg/dL   Total Protein 5.8 (L) 6.5 - 8.1 g/dL   Albumin 2.6 (L) 3.5 - 5.0 g/dL   AST 36 15 - 41 U/L   ALT 19 14 - 54 U/L   Alkaline Phosphatase 69 38 - 126 U/L   Total Bilirubin 0.4 0.3 - 1.2 mg/dL   GFR calc non Af Amer 46 (L) >60 mL/min   GFR calc Af Amer 53 (L) >60 mL/min    Comment: (NOTE) The eGFR has been calculated using the CKD EPI equation. This calculation has not been validated in all clinical situations. eGFR's persistently <60 mL/min signify possible Chronic Kidney Disease.    Anion gap 8 5 - 15    Comment: Performed at [facility], [address] [place], [place] [address]  Lactic acid, plasma     Status: None   Collection Time: 04/03/18  9:49 PM  Result Value Ref Range   Lactic Acid, Venous 1.5 0.5 - 1.9 mmol/L    Comment: Performed at [facility], [address] [place],  [address]  CBC with Differential     Status: Abnormal   Collection Time: 04/03/18  9:49 PM  Result Value Ref Range   WBC 9.3 3.6 - 11.0 K/uL   RBC 3.28 (L) 3.80 - 5.20 MIL/uL   Hemoglobin 10.5 (L) 12.0 - 16.0 g/dL   HCT 30.9 (L) 35.0 - 47.0 %   MCV 94.4 80.0 - 100.0 fL   MCH 31.9 26.0 - 34.0 pg   MCHC 33.9 32.0 - 36.0 g/dL   RDW 15.5 (H) 11.5 - 14.5 %   Platelets 337 150 - 440 K/uL  Neutrophils Relative % 75 %   Neutro Abs 7.0 (H) 1.4 - 6.5 K/uL   Lymphocytes Relative 14 %   Lymphs Abs 1.3 1.0 - 3.6 K/uL   Monocytes Relative 10 %   Monocytes Absolute 0.9 0.2 - 0.9 K/uL   Eosinophils Relative 1 %   Eosinophils Absolute 0.1 0 - 0.7 K/uL   Basophils Relative 0 %   Basophils Absolute 0.0 0 - 0.1 K/uL    Comment: Performed at [facility], [address] [place], [place] [address]  Culture,  blood (Routine x 2)     Status: None   Collection Time: 04/03/18  9:49 PM  Result Value Ref Range   Specimen Description BLOOD L AC    Special Requests      BOTTLES DRAWN AEROBIC AND ANAEROBIC Blood Culture adequate volume   Culture      NO GROWTH 5 DAYS Performed at [facility], [address], [place], [place] [address]    Report Status 04/08/2018 FINAL   Brain natriuretic peptide     Status: None   Collection Time: 04/03/18  9:49 PM  Result Value Ref Range   B Natriuretic Peptide 83.0 0.0 - 100.0 pg/mL    Comment: Performed at [facility], [address] [place], [place] [address]  Troponin I     Status: None   Collection Time: 04/03/18  9:49 PM  Result Value Ref Range   Troponin I <0.03 <0.03 ng/mL    Comment: Performed at [facility], [address] [place], [place] [address]  TSH     Status: Abnormal   Collection Time: 04/03/18  9:49 PM  Result Value Ref Range   TSH 9.580 (H) 0.350 - 4.500 uIU/mL    Comment: Performed by a 3rd Generation assay with a functional sensitivity of <=0.01 uIU/mL. Performed at [facility], [address] [place], [place] [address]   Lactic acid, plasma     Status: None   Collection Time: 04/03/18 11:28 PM  Result Value Ref Range   Lactic Acid, Venous 0.9 0.5 - 1.9 mmol/L    Comment: Performed at [facility], [address] [place], [place] [address]  Protime-INR     Status: Abnormal   Collection Time: 04/03/18 11:28 PM  Result Value Ref Range   Prothrombin Time 18.9 (H) 11.4 - 15.2 seconds   INR 1.60     Comment: Performed at [facility], [address] [place], [place] [address]  Urinalysis, Complete w Microscopic     Status: Abnormal   Collection Time: 04/04/18  2:20 AM  Result Value Ref Range   Color, Urine YELLOW (A) YELLOW   APPearance TURBID (A) CLEAR   Specific Gravity, Urine >1.046 (H) 1.005 - 1.030   pH 6.0 5.0 - 8.0   Glucose, UA NEGATIVE NEGATIVE mg/dL    Hgb urine dipstick SMALL (A) NEGATIVE   Bilirubin Urine NEGATIVE NEGATIVE   Ketones, ur NEGATIVE NEGATIVE mg/dL   Protein, ur 30 (A) NEGATIVE mg/dL   Nitrite POSITIVE (A) NEGATIVE   Leukocytes, UA LARGE (A) NEGATIVE   RBC / HPF 11-20 0 - 5 RBC/hpf   WBC, UA >50 (H) 0 - 5 WBC/hpf   Bacteria, UA MANY (A) NONE SEEN   Squamous Epithelial / LPF 11-20 0 - 5    Comment: Please note change in reference range.   WBC Clumps PRESENT    Mucus PRESENT    Non Squamous Epithelial 0-5 (A) NONE SEEN  Comment: Performed at [facility], [address] [place], [place] [address]  MRSA PCR Screening     Status: None   Collection Time: 04/04/18  4:28 AM  Result Value Ref Range   MRSA by PCR NEGATIVE NEGATIVE    Comment:        The GeneXpert MRSA Assay (FDA approved for NASAL specimens only), is one component of a comprehensive MRSA colonization surveillance program. It is not intended to diagnose MRSA infection nor to guide or monitor treatment for MRSA infections. Performed at [facility], [address] [place], [place] [address]   Glucose, capillary     Status: Abnormal   Collection Time: 04/04/18  4:29 AM  Result Value Ref Range   Glucose-Capillary 115 (H) 65 - 99 mg/dL  Procalcitonin - Baseline     Status: None   Collection Time: 04/04/18  5:35 AM  Result Value Ref Range   Procalcitonin <0.10 ng/mL    Comment:        Interpretation: PCT (Procalcitonin) <= 0.5 ng/mL: Systemic infection (sepsis) is not likely. Local bacterial infection is possible. (NOTE)       Sepsis PCT Algorithm           Lower Respiratory Tract                                      Infection PCT Algorithm    ----------------------------     ----------------------------         PCT < 0.25 ng/mL                PCT < 0.10 ng/mL         Strongly encourage             Strongly discourage   discontinuation of antibiotics    initiation of antibiotics    ----------------------------      -----------------------------       PCT 0.25 - 0.50 ng/mL            PCT 0.10 - 0.25 ng/mL               OR       >80% decrease in PCT            Discourage initiation of                                            antibiotics      Encourage discontinuation           of antibiotics    ----------------------------     -----------------------------         PCT >= 0.50 ng/mL              PCT 0.26 - 0.50 ng/mL               AND        <80% decrease in PCT             Encourage initiation of                                             antibiotics       Encourage  continuation           of antibiotics    ----------------------------     -----------------------------        PCT >= 0.50 ng/mL                  PCT > 0.50 ng/mL               AND         increase in PCT                  Strongly encourage                                      initiation of antibiotics    Strongly encourage escalation           of antibiotics                                     -----------------------------                                           PCT <= 0.25 ng/mL                                                 OR                                        > 80% decrease in PCT                                     Discontinue / Do not initiate                                             antibiotics Performed at [facility], [address] [place], [place] [address]   Glucose, capillary     Status: None   Collection Time: 04/04/18  7:34 AM  Result Value Ref Range   Glucose-Capillary 84 65 - 99 mg/dL  Glucose, capillary     Status: Abnormal   Collection Time: 04/04/18 11:40 AM  Result Value Ref Range   Glucose-Capillary 127 (H) 65 - 99 mg/dL  Glucose, capillary     Status: Abnormal   Collection Time: 04/04/18  4:24 PM  Result Value Ref Range   Glucose-Capillary 112 (H) 65 - 99 mg/dL  Glucose, capillary     Status: Abnormal   Collection Time: 04/04/18  9:00 PM  Result Value Ref Range    Glucose-Capillary 107 (H) 65 - 99 mg/dL   Comment 1 Notify RN    Comment 2 Document in Chart   Procalcitonin     Status: None   Collection Time: 04/05/18  4:57 AM  Result Value Ref Range   Procalcitonin <0.10 ng/mL    Comment:  Interpretation: PCT (Procalcitonin) <= 0.5 ng/mL: Systemic infection (sepsis) is not likely. Local bacterial infection is possible. (NOTE)       Sepsis PCT Algorithm           Lower Respiratory Tract                                      Infection PCT Algorithm    ----------------------------     ----------------------------         PCT < 0.25 ng/mL                PCT < 0.10 ng/mL         Strongly encourage             Strongly discourage   discontinuation of antibiotics    initiation of antibiotics    ----------------------------     -----------------------------       PCT 0.25 - 0.50 ng/mL            PCT 0.10 - 0.25 ng/mL               OR       >80% decrease in PCT            Discourage initiation of                                            antibiotics      Encourage discontinuation           of antibiotics    ----------------------------     -----------------------------         PCT >= 0.50 ng/mL              PCT 0.26 - 0.50 ng/mL               AND        <80% decrease in PCT             Encourage initiation of                                             antibiotics       Encourage continuation           of antibiotics    ----------------------------     -----------------------------        PCT >= 0.50 ng/mL                  PCT > 0.50 ng/mL               AND         increase in PCT                  Strongly encourage                                      initiation of antibiotics    Strongly encourage escalation           of antibiotics                                     -----------------------------  PCT <= 0.25 ng/mL                                                 OR                                         > 80% decrease in PCT                                     Discontinue / Do not initiate                                             antibiotics Performed at [facility], [address] [place], [place] [address]   Glucose, capillary     Status: Abnormal   Collection Time: 04/05/18  7:30 AM  Result Value Ref Range   Glucose-Capillary 122 (H) 65 - 99 mg/dL  Glucose, capillary     Status: Abnormal   Collection Time: 04/05/18 11:51 AM  Result Value Ref Range   Glucose-Capillary 117 (H) 65 - 99 mg/dL  Urine Culture     Status: None   Collection Time: 04/05/18 12:48 PM  Result Value Ref Range   Specimen Description      URINE, RANDOM Performed at [facility], [address] [place], [place] [address]    Special Requests      Normal Performed at [facility], [address], [place], [place] [address]    Culture      NO GROWTH Performed at [facility], [place] [address], [place], [place] [address]    Report Status 04/06/2018 FINAL   Glucose, capillary     Status: Abnormal   Collection Time: 04/05/18  4:57 PM  Result Value Ref Range   Glucose-Capillary 109 (H) 65 - 99 mg/dL   Comment 1 Notify RN   Glucose, capillary     Status: Abnormal   Collection Time: 04/05/18  9:16 PM  Result Value Ref Range   Glucose-Capillary 135 (H) 65 - 99 mg/dL  Procalcitonin     Status: None   Collection Time: 04/06/18  5:28 AM  Result Value Ref Range   Procalcitonin <0.10 ng/mL    Comment:        Interpretation: PCT (Procalcitonin) <= 0.5 ng/mL: Systemic infection (sepsis) is not likely. Local bacterial infection is possible. (NOTE)       Sepsis PCT Algorithm           Lower Respiratory Tract                                      Infection PCT Algorithm    ----------------------------     ----------------------------         PCT < 0.25 ng/mL                PCT < 0.10 ng/mL         Strongly encourage  Strongly discourage    discontinuation of antibiotics    initiation of antibiotics    ----------------------------     -----------------------------       PCT 0.25 - 0.50 ng/mL            PCT 0.10 - 0.25 ng/mL               OR       >80% decrease in PCT            Discourage initiation of                                            antibiotics      Encourage discontinuation           of antibiotics    ----------------------------     -----------------------------         PCT >= 0.50 ng/mL              PCT 0.26 - 0.50 ng/mL               AND        <80% decrease in PCT             Encourage initiation of                                             antibiotics       Encourage continuation           of antibiotics    ----------------------------     -----------------------------        PCT >= 0.50 ng/mL                  PCT > 0.50 ng/mL               AND         increase in PCT                  Strongly encourage                                      initiation of antibiotics    Strongly encourage escalation           of antibiotics                                     -----------------------------                                           PCT <= 0.25 ng/mL                                                 OR                                        >  80% decrease in PCT                                     Discontinue / Do not initiate                                             antibiotics Performed at [facility], [address] [place], [place] [address]   CBC     Status: Abnormal   Collection Time: 04/06/18  5:28 AM  Result Value Ref Range   WBC 6.1 3.6 - 11.0 K/uL   RBC 2.79 (L) 3.80 - 5.20 MIL/uL   Hemoglobin 9.4 (L) 12.0 - 16.0 g/dL   HCT 26.2 (L) 35.0 - 47.0 %   MCV 94.0 80.0 - 100.0 fL   MCH 33.5 26.0 - 34.0 pg   MCHC 35.7 32.0 - 36.0 g/dL   RDW 15.8 (H) 11.5 - 14.5 %   Platelets 331 150 - 440 K/uL    Comment: Performed at [facility], [address] [place], [place]  [address]  Basic metabolic panel     Status: Abnormal   Collection Time: 04/06/18  5:28 AM  Result Value Ref Range   Sodium 140 135 - 145 mmol/L   Potassium 3.7 3.5 - 5.1 mmol/L   Chloride 106 101 - 111 mmol/L   CO2 28 22 - 32 mmol/L   Glucose, Bld 119 (H) 65 - 99 mg/dL   BUN 11 6 - 20 mg/dL   Creatinine, Ser 0.85 0.44 - 1.00 mg/dL   Calcium 7.4 (L) 8.9 - 10.3 mg/dL   GFR calc non Af Amer 59 (L) >60 mL/min   GFR calc Af Amer >60 >60 mL/min    Comment: (NOTE) The eGFR has been calculated using the CKD EPI equation. This calculation has not been validated in all clinical situations. eGFR's persistently <60 mL/min signify possible Chronic Kidney Disease.    Anion gap 6 5 - 15    Comment: Performed at [facility], [address] [place], [place] [address]  Protime-INR     Status: Abnormal   Collection Time: 04/06/18  5:28 AM  Result Value Ref Range   Prothrombin Time 16.3 (H) 11.4 - 15.2 seconds   INR 1.32     Comment: Performed at [facility], [address] [place], [place] [address]  Glucose, capillary     Status: Abnormal   Collection Time: 04/06/18  7:48 AM  Result Value Ref Range   Glucose-Capillary 115 (H) 65 - 99 mg/dL   Comment 1 Notify RN   Glucose, capillary     Status: Abnormal   Collection Time: 04/06/18 11:55 AM  Result Value Ref Range   Glucose-Capillary 109 (H) 65 - 99 mg/dL   Comment 1 Notify RN   Glucose, capillary     Status: Abnormal   Collection Time: 04/06/18  4:37 PM  Result Value Ref Range   Glucose-Capillary 129 (H) 65 - 99 mg/dL   Comment 1 Notify RN   Glucose, capillary     Status: Abnormal   Collection Time: 04/06/18  9:20 PM  Result Value Ref Range   Glucose-Capillary 123 (H) 65 - 99 mg/dL  Protime-INR     Status: Abnormal   Collection Time: 04/07/18  4:51 AM  Result Value Ref Range   Prothrombin Time 16.7 (H) 11.4 - 15.2 seconds   INR 1.36     Comment: Performed at [facility], [address]  [place], [place] [address]  Glucose, capillary     Status: Abnormal   Collection Time: 04/07/18  7:49 AM  Result Value Ref Range   Glucose-Capillary 103 (H) 65 - 99 mg/dL   Comment 1 Notify RN   Glucose, capillary     Status: Abnormal   Collection Time: 04/07/18 12:02 PM  Result Value Ref Range   Glucose-Capillary 122 (H) 65 - 99 mg/dL   Comment 1 Notify RN   Glucose, capillary     Status: Abnormal   Collection Time: 04/07/18  4:45 PM  Result Value Ref Range   Glucose-Capillary 118 (H) 65 - 99 mg/dL   Comment 1 Notify RN   Glucose, capillary     Status: None   Collection Time: 04/07/18  9:34 PM  Result Value Ref Range   Glucose-Capillary 90 65 - 99 mg/dL   Comment 1 Notify RN   Protime-INR     Status: Abnormal   Collection Time: 04/08/18  5:01 AM  Result Value Ref Range   Prothrombin Time 18.3 (H) 11.4 - 15.2 seconds   INR 1.53     Comment: Performed at [facility], [address] [place], [place] [address]  Glucose, capillary     Status: Abnormal   Collection Time: 04/08/18  7:51 AM  Result Value Ref Range   Glucose-Capillary 111 (H) 65 - 99 mg/dL  Glucose, capillary     Status: None   Collection Time: 04/08/18 11:50 AM  Result Value Ref Range   Glucose-Capillary 95 65 - 99 mg/dL   Comment 1 Notify RN   Protime-INR     Status: Abnormal   Collection Time: 04/09/18 11:24 AM  Result Value Ref Range   INR 1.4 (H) 0.8 - 1.2    Comment: Reference interval is for non-anticoagulated patients. Suggested INR therapeutic range for Vitamin K antagonist therapy:    Standard Dose (moderate intensity                   therapeutic range):       2.0 - 3.0    Higher intensity therapeutic range       2.5 - 3.5    Prothrombin Time 14.4 (H) 9.1 - 12.0 sec  POCT HgB A1C     Status: None   Collection Time: 04/11/18 11:05 AM  Result Value Ref Range   Hemoglobin A1C 5.9   Vitamin B12     Status: None   Collection Time: 04/30/18  1:08 PM  Result Value Ref Range   Vitamin B-12 432  180 - 914 pg/mL    Comment: (NOTE) This assay is not validated for testing neonatal or myeloproliferative syndrome specimens for Vitamin B12 levels. Performed at [facility], [place] [address], [place], [place] [address]   Folate     Status: None   Collection Time: 04/30/18  1:08 PM  Result Value Ref Range   Folate 39.0 >5.9 ng/mL    Comment: Performed at [facility], [address] [place], [place] [address]  Ferritin     Status: None   Collection Time: 04/30/18  1:08 PM  Result Value Ref Range   Ferritin 21 11 - 307 ng/mL    Comment: Performed at [facility], [address] [place], [place] [address]  Iron  and TIBC     Status: Abnormal   Collection Time: 04/30/18  1:08 PM  Result Value Ref Range   Iron 13 (L) 28 - 170 ug/dL   TIBC 189 (L) 250 - 450 ug/dL   Saturation Ratios 7 (L) 10.4 - 31.8 %   UIBC 176 ug/dL    Comment: Performed at [facility], [address] [place], [place] [address]  APTT     Status: Abnormal   Collection Time: 04/30/18  1:08 PM  Result Value Ref Range   aPTT 88 (H) 24 - 36 seconds    Comment:        IF BASELINE aPTT IS ELEVATED, SUGGEST PATIENT RISK ASSESSMENT BE USED TO DETERMINE APPROPRIATE ANTICOAGULANT THERAPY. Performed at [facility], [address] [place], [place] [address]   Protime-INR     Status: Abnormal   Collection Time: 04/30/18  1:08 PM  Result Value Ref Range   Prothrombin Time 39.4 (H) 11.4 - 15.2 seconds   INR 4.09 (HH)     Comment: RESULT REPEATED AND VERIFIED CRITICAL RESULT CALLED TO, READ BACK BY AND VERIFIED WITH: COURTNEY GARRETT 04/30/18 [place] at [facility], [address] [place], [place] [address]   CBC with Differential/Platelet     Status: Abnormal   Collection Time: 04/30/18  1:08 PM  Result Value Ref Range   WBC 7.8 3.6 - 11.0 K/uL   RBC 3.77 (L) 3.80 - 5.20 MIL/uL   Hemoglobin 11.5 (L) 12.0 - 16.0 g/dL   HCT 34.6 (L) 35.0 - 47.0 %    MCV 91.8 80.0 - 100.0 fL   MCH 30.6 26.0 - 34.0 pg   MCHC 33.3 32.0 - 36.0 g/dL   RDW 15.8 (H) 11.5 - 14.5 %   Platelets 336 150 - 440 K/uL   Neutrophils Relative % 67 %   Neutro Abs 5.2 1.4 - 6.5 K/uL   Lymphocytes Relative 20 %   Lymphs Abs 1.6 1.0 - 3.6 K/uL   Monocytes Relative 11 %   Monocytes Absolute 0.9 0.2 - 0.9 K/uL   Eosinophils Relative 1 %   Eosinophils Absolute 0.1 0 - 0.7 K/uL   Basophils Relative 1 %   Basophils Absolute 0.0 0 - 0.1 K/uL    Comment: Performed at [facility], [address] [place], [place] [address]   Objective  Body mass index is 28.44 kg/m. Wt Readings from Last 3 Encounters:  05/02/18 181 lb 9.6 oz (82.4 kg)  04/30/18 186 lb 1.6 oz (84.4 kg)  04/11/18 202 lb 12.8 oz (92 kg)   Temp Readings from Last 3 Encounters:  05/02/18 99.1 F (37.3 C) (Oral)  04/30/18 98.1 F (36.7 C)  04/11/18 98.5 F (36.9 C) (Oral)   BP Readings from Last 3 Encounters:  05/02/18 136/62  04/30/18 128/86  04/11/18 136/66   Pulse Readings from Last 3 Encounters:  05/02/18 79  04/30/18 76  04/11/18 80    Physical Exam  Constitutional: She is oriented to person, place, and time. Vital signs are normal. She appears well-developed and well-nourished. She is cooperative.  HENT:  Head: Normocephalic and atraumatic.  Mouth/Throat: Oropharynx is clear and moist and mucous membranes are normal.  Hearing loss b/l ears hears better on right side   Eyes: Pupils are equal, round, and reactive to light. Right conjunctiva is injected. Left conjunctiva is injected.  Cardiovascular: Normal rate, regular rhythm and normal heart sounds.  Chronic lymphedema s/p DVT but edema improved  Pulmonary/Chest: Effort normal and breath sounds normal.  Neurological: She is alert and oriented to person, place, and time.  In wheelchair today   Skin: Skin is warm, dry and intact.  Psychiatric: She has a normal mood and affect. Her speech is normal and behavior is normal.  Judgment and thought content normal. Cognition and memory are normal.  Nursing note and vitals reviewed.   Assessment   1. 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 Chronic lymphedema likely 2/2 #1  3. CT 04/04/18 +bronchiectasis, mucus, pneumonia likely HAP s/p Abx with Doxycycline, h/o COPD 4. Iron def anemia  5. Hypothyroidism  6. Physical deconditioning  7. HM Plan  1.  F/u H/o Dr. Tasia Catchings until INR goal 2-3 consistent then I will continue to manage coumadin  For now RN at home can check INR report to Dr. Tasia Catchings and I am trying to order INR machine for home use long term filled out forms today  Hold Coumadin for now due to INR 4.09 04/30/18 check INR today Consider restart coumadin 15 mg-18 mg qd based on INR from today 3 years ago pt was on 19 mg qd Coumadin managed by H/O  -advise pt to start coumadin 16 mg qd after INR result 2.6 from today 05/02/18 Stop Lovenox 92 bid pt does not want to do shots long term  appt with Dr. Lucky Cowboy upcoming Will CC Dr. Tasia Catchings so sooner f/u can be scheduled    2. See #1  3. Repeat CT chest 05/06/18  If still as prior refer to pulmonary  Continue Mucinex rec DM green label  4. Continue ferrous sulfate but space of >4 hours from thyroid medication  5. Check TSH at f/u cont levo 200 mcg all days except sundays  6. Re order H/H RN, PT, OT [place] at home  7.  Fluhad pna 23 vx had 09/24/06 this will need to be updated in future at f/u  prevnar had 09/20/16 Tdap 08/04/16 shingrixnot had out of stock called pharmacy,? zostavaxif had  Disc dermatology at f/u  Out of age window pap, colonoscopy, mammo DEXA 01/01/08 osteopeniawill make sure on ca 600 bid and vit D 1000 iu qd Vitamin d 03/15/18 48.1   Provider: Dr. Olivia Mackie McLean-Scocuzza-Internal Medicine

## 2018-05-02 NOTE — Progress Notes (Signed)
Pre visit review using our clinic review tool, if applicable. No additional management support is needed unless otherwise documented below in the visit note. 

## 2018-05-02 NOTE — Telephone Encounter (Signed)
Please advise 

## 2018-05-03 ENCOUNTER — Telehealth: Payer: Self-pay | Admitting: *Deleted

## 2018-05-03 ENCOUNTER — Ambulatory Visit (INDEPENDENT_AMBULATORY_CARE_PROVIDER_SITE_OTHER): Payer: Medicare Other | Admitting: Vascular Surgery

## 2018-05-03 ENCOUNTER — Ambulatory Visit (INDEPENDENT_AMBULATORY_CARE_PROVIDER_SITE_OTHER): Payer: Medicare Other

## 2018-05-03 ENCOUNTER — Telehealth: Payer: Self-pay | Admitting: Internal Medicine

## 2018-05-03 ENCOUNTER — Telehealth: Payer: Self-pay

## 2018-05-03 ENCOUNTER — Encounter (INDEPENDENT_AMBULATORY_CARE_PROVIDER_SITE_OTHER): Payer: Self-pay | Admitting: Vascular Surgery

## 2018-05-03 VITALS — BP 118/70 | HR 76 | Resp 15 | Ht 67.0 in | Wt 187.0 lb

## 2018-05-03 DIAGNOSIS — I824Y3 Acute embolism and thrombosis of unspecified deep veins of proximal lower extremity, bilateral: Secondary | ICD-10-CM

## 2018-05-03 DIAGNOSIS — I10 Essential (primary) hypertension: Secondary | ICD-10-CM

## 2018-05-03 DIAGNOSIS — I82403 Acute embolism and thrombosis of unspecified deep veins of lower extremity, bilateral: Secondary | ICD-10-CM

## 2018-05-03 DIAGNOSIS — I82423 Acute embolism and thrombosis of iliac vein, bilateral: Secondary | ICD-10-CM | POA: Diagnosis not present

## 2018-05-03 DIAGNOSIS — E119 Type 2 diabetes mellitus without complications: Secondary | ICD-10-CM

## 2018-05-03 NOTE — Telephone Encounter (Signed)
[place] Case Manager, Forde Radon to get [facility] results.    INR: 2.5  Per Dr Tasia Catchings, continue 16mg  of coumadin and repeat INR on Monday by home health.

## 2018-05-03 NOTE — Telephone Encounter (Signed)
Copied from [place] [id number]. Topic: Quick Communication - Office Called Patient >> May 03, 2018 10:40 AM Synthia Innocent wrote: Reason for CRM: returning call

## 2018-05-03 NOTE — Telephone Encounter (Signed)
Please advise 

## 2018-05-03 NOTE — Assessment & Plan Note (Signed)
blood pressure control important in reducing the progression of atherosclerotic disease. On appropriate oral medications.  

## 2018-05-03 NOTE — Progress Notes (Signed)
MRN : [id number]  Jessica Erickson is a 84 y.o. (26-Dec-1925) female who presents with chief complaint of  Chief Complaint  Patient presents with  . Follow-up    1 month [facility] follow up  .  History of Present Illness: Patient returns today in follow up of bilateral iliofemoral and IVC thromboses.  She underwent percutaneous thrombectomy and on duplex today although the study is limited we really do not see any significant thrombosis in either iliac vein or the IVC.  Her leg swelling is markedly decreased and basically down to normal.  She has no chest pain or shortness of breath.  Current Outpatient Medications  Medication Sig Dispense Refill  . acetaminophen (TYLENOL) 325 MG tablet Take 650 mg by mouth every 6 (six) hours as needed.    . Amino Acids-Protein Hydrolys (FEEDING SUPPLEMENT, PRO-STAT SUGAR FREE 64,) LIQD Take 30 mLs by mouth 2 (two) times daily between meals.    Marland Kitchen atorvastatin (LIPITOR) 40 MG tablet Take 1 tablet (40 mg total) by mouth daily at 6 PM. Generic ok 90 tablet 3  . bisacodyl (DULCOLAX) 5 MG EC tablet Take 1 tablet (5 mg total) by mouth daily as needed for moderate constipation. 30 tablet 5  . budesonide-formoterol (SYMBICORT) 160-4.5 MCG/ACT inhaler Inhale 2 puffs into the lungs 2 (two) times daily. Rinse mouth 1 Inhaler 11  . Calcium Carbonate-Vitamin D (CALCIUM 600+D) 600-400 MG-UNIT tablet Take 1 tablet by mouth 2 (two) times daily. Lunch and dinner 90 tablet 3  . carbamazepine (TEGRETOL) 200 MG tablet Take 1 tablet (200 mg total) by mouth 2 (two) times daily. 180 tablet 3  . docusate sodium (COLACE) 100 MG capsule Take 1 capsule (100 mg total) by mouth 2 (two) times daily as needed for mild constipation. 180 capsule 3  . ferrous sulfate 325 (65 FE) MG tablet Take 1 tablet (325 mg total) by mouth daily after lunch. 90 tablet 3  . fluticasone (FLONASE) 50 MCG/ACT nasal spray Place 1-2 sprays into both nostrils daily. Max 2 sprays 16 g 11  . furosemide (LASIX) 40 MG  tablet Take 1 tablet (40 mg total) by mouth daily. 30 tablet 2  . gabapentin (NEURONTIN) 100 MG capsule Take 1 capsule (100 mg total) by mouth 2 (two) times daily. 180 capsule 1  . guaiFENesin (MUCINEX) 600 MG 12 hr tablet Take 600 mg by mouth daily.     Marland Kitchen HYDROcodone-acetaminophen (NORCO/VICODIN) 5-325 MG tablet Take 1 tablet by mouth every 6 (six) hours as needed for moderate pain or severe pain. 15 tablet 0  . ipratropium-albuterol (DUONEB) 0.5-2.5 (3) MG/3ML SOLN Take 3 mLs by nebulization 3 (three) times daily as needed. 360 mL 11  . levothyroxine (SYNTHROID, LEVOTHROID) 200 MCG tablet Take 1 tablet (200 mcg total) by mouth daily before breakfast. Except on Sunday. Do not take with other medications or vitamins 90 tablet 1  . lidocaine (LIDODERM) 5 % Place 1 patch onto the skin daily. Remove & Discard patch within 12 hours or as directed by MD 30 patch 0  . metFORMIN (GLUCOPHAGE-XR) 500 MG 24 hr tablet Take 1 tablet (500 mg total) by mouth daily with breakfast. 90 tablet 3  . Multiple Vitamin (MULTIVITAMIN WITH MINERALS) TABS tablet Take 1 tablet by mouth daily.    . Nutritional Supplements (ENSURE ENLIVE PO) Take 1 Bottle by mouth 2 (two) times daily.    . ondansetron (ZOFRAN) 4 MG tablet Take 1 tablet (4 mg total) by mouth 2 (two) times daily  as needed for nausea or vomiting. 30 tablet 0  . pantoprazole (PROTONIX) 40 MG tablet Take 1 tablet (40 mg total) by mouth daily. 30 minutes before lunch or dinner 90 tablet 3  . polyethylene glycol powder (GLYCOLAX/MIRALAX) powder Take 17 g by mouth daily as needed for mild constipation. 255 g 11  . polyvinyl alcohol (LIQUIFILM TEARS) 1.4 % ophthalmic solution Place 1 drop into both eyes as needed for dry eyes. 15 mL 11  . senna-docusate (SENOKOT-S) 8.6-50 MG tablet Take 1 tablet by mouth at bedtime as needed for mild constipation.    Marland Kitchen tiotropium (SPIRIVA) 18 MCG inhalation capsule Place 1 capsule (18 mcg total) into inhaler and inhale daily. 90 capsule  3  . traZODone (DESYREL) 50 MG tablet Take 0.5 tablets (25 mg total) by mouth at bedtime as needed for sleep. 20 tablet 0  . warfarin (COUMADIN) 10 MG tablet Total 16 mg qpm (3 mg +3+ 10 mg) 90 tablet 0  . warfarin (COUMADIN) 3 MG tablet Total 16 mg per day ( 3+3+ 10 mg daily) 90 tablet 2   No current facility-administered medications for this visit.     Past Medical History:  Diagnosis Date  . Acoustic neuroma ([place])   . Allergy   . Asthma   . Bilateral swelling of feet    and legs  . Bladder infection   . CAD (coronary artery disease)   . Cataract   . Change in voice   . Compression fracture of body of thoracic vertebra (HCC)    T12 09/18/15 MRI s/p fall   . Constipation   . COPD (chronic obstructive pulmonary disease) ([place])    previous CXR with chronic interstitial lung dz   . CVA (cerebral vascular accident) ([place])   . Depression   . Diabetes ([place])    with neuropathy  . Diabetes mellitus, type 2 ([place])   . Diarrhea   . Double vision   . DVT (deep venous thrombosis) ([place])    right leg 10/2015 was on coumadin off as of 2017/2018 ; s/p IVC filter  . Enuresis   . Eye pain, right   . Fall   . Fatty liver    09/15/15 also mildly dilated pancreatitic duct rec MRCP small sub cm cyst hemangioma speeln mild right hydronephrorossi and prox. hydroureter, kidney stones, mild scarring kidneys  . Female stress incontinence   . Flank pain   . GERD (gastroesophageal reflux disease)    with small hiatal hernia   . Hard of hearing   . Heart disease   . History of kidney problems   . Hyperlipidemia    mixed  . Hypertension   . Hypothyroidism, postsurgical   . Impaired mobility and ADLs    uses rolling walker has caretaker 24/7 at home  . Leg edema   . Mixed incontinence urge and stress (female)(female)   . Neuropathy   . Osteoarthritis    DDD spine   . Osteoporosis with fracture    T12 compression fracture  . Photophobia   . Pulmonary embolism ([place])    10/2015 off coumadin as  of 04/2016  . Pulmonary HTN (HCC)    mild pulm HTN, echo 10/09/15 EF 75-17%GYFVC 1 dd, RV systolic pressure increased   . Recurrent UTI   . Sinus pressure   . Skin cancer    BCC jawline and scalp   . Thyroid disease    follows [facility]  . TIA (transient ischemic attack)    MRI 2009/2010  neg stroke   . Trigeminal neuralgia    Dr. Tomi Bamberger s/p gamma knife x 2, on Tegretol since 2011/2012 no increase in dose >200 mg bid rec per family per neurology   . Urinary frequency   . Urinary, incontinence, stress female    Dr Erlene Quan urology     Past Surgical History:  Procedure Laterality Date  . APPENDECTOMY     as a child, open  . BRAIN SURGERY     schwnnoma removal 1996   . brain tumor surgery    . BREAST SURGERY     breast bx  . CATARACT EXTRACTION    . CHOLECYSTECTOMY    . EYE SURGERY     cataract  . IVC FILTER PLACEMENT ([facility] HX)     Dr. Lucky Cowboy 10/2015   . LAPAROSCOPIC TUBAL LIGATION    . MOHS SURGERY     scalp 04/2014   . PERIPHERAL VASCULAR CATHETERIZATION N/A 10/11/2015   Procedure: IVC Filter Insertion;  Surgeon: Algernon Huxley, MD;  Location: [facility];  Service: Cardiovascular;  Laterality: N/A;  . PERIPHERAL VASCULAR THROMBECTOMY Bilateral 03/29/2018   Procedure: PERIPHERAL VASCULAR THROMBECTOMY;  Surgeon: Algernon Huxley, MD;  Location: [facility];  Service: Cardiovascular;  Laterality: Bilateral;  . PUBOVAGINAL SLING    . THROAT SURGERY    . THYROID SURGERY     tumor around vocal cords   . TOOTH EXTRACTION     winter 2018   . TOTAL THYROIDECTOMY  1976    Social History Social History   Tobacco Use  . Smoking status: Former Smoker    Types: Cigarettes    Last attempt to quit: 09/20/1995    Years since quitting: 22.6  . Smokeless tobacco: Never Used  . Tobacco comment: quit 1996 smoked 20 years max 8 cig qd   Substance Use Topics  . Alcohol use: No  . Drug use: No    Family History Family History  Problem Relation Age of Onset  . Heart  disease Mother   . Diabetes Father   . Cancer Daughter        breast ca x 2 s/p mastectomy      Allergies  Allergen Reactions  . Penicillins Shortness Of Breath, Rash and Other (See Comments)    Has patient had a PCN reaction causing immediate rash, facial/tongue/throat swelling, SOB or lightheadedness with hypotension: Yes Has patient had a PCN reaction causing severe rash involving mucus membranes or skin necrosis: No Has patient had a PCN reaction that required hospitalization No Has patient had a PCN reaction occurring within the last 10 years: No If all of the above answers are "NO", then may proceed with Cephalosporin use.  . Sulfa Antibiotics Shortness Of Breath, Rash and Other (See Comments)  . Amitiza [Lubiprostone]     N/v/d  . Aspirin Other (See Comments)    Reaction:  Unknown  Other reaction(s): Bleeding (intolerance) Per patient " causes nose to bleed" Can take 81 mg daily without any complications Other reaction(s): "bloody nose"   . Penicillin G Other (See Comments)    Has patient had a PCN reaction causing immediate rash, facial/tongue/throat swelling, SOB or lightheadedness with hypotension: No Has patient had a PCN reaction causing severe rash involving mucus membranes or skin necrosis: Unknown Has patient had a PCN reaction that required hospitalization: Unknown Has patient had a PCN reaction occurring within the last 10 years: Unknown If all of the above answers are "NO", then may  proceed with Cephalosporin use.     REVIEW OF SYSTEMS (Negative unless checked)  Constitutional: '[]'$ Weight loss  '[]'$ Fever  '[]'$ Chills Cardiac: '[]'$ Chest pain   '[]'$ Chest pressure   '[x]'$ Palpitations   '[]'$ Shortness of breath when laying flat   '[]'$ Shortness of breath at rest   '[]'$ Shortness of breath with exertion. Vascular:  '[]'$ Pain in legs with walking   '[]'$ Pain in legs at rest   '[]'$ Pain in legs when laying flat   '[]'$ Claudication   '[]'$ Pain in feet when walking  '[]'$ Pain in feet at rest  '[]'$ Pain in feet  when laying flat   '[x]'$ History of DVT   '[]'$ Phlebitis   '[x]'$ Swelling in legs   '[]'$ Varicose veins   '[]'$ Non-healing ulcers Pulmonary:   '[]'$ Uses home oxygen   '[x]'$ Productive cough   '[]'$ Hemoptysis   '[]'$ Wheeze  '[x]'$ COPD   '[]'$ Asthma Neurologic:  '[]'$ Dizziness  '[]'$ Blackouts   '[]'$ Seizures   '[]'$ History of stroke   '[]'$ History of TIA  '[]'$ Aphasia   '[]'$ Temporary blindness   '[]'$ Dysphagia   '[]'$ Weakness or numbness in arms   '[]'$ Weakness or numbness in legs Musculoskeletal:  '[x]'$ Arthritis   '[]'$ Joint swelling   '[x]'$ Joint pain   '[]'$ Low back pain Hematologic:  '[x]'$ Easy bruising  '[]'$ Easy bleeding   '[]'$ Hypercoagulable state   '[x]'$ Anemic   Gastrointestinal:  '[]'$ Blood in stool   '[]'$ Vomiting blood  '[x]'$ Gastroesophageal reflux/heartburn   '[]'$ Abdominal pain Genitourinary:  '[x]'$ Chronic kidney disease   '[]'$ Difficult urination  '[]'$ Frequent urination  '[]'$ Burning with urination   '[]'$ Hematuria Skin:  '[]'$ Rashes   '[]'$ Ulcers   '[]'$ Wounds Psychological:  '[]'$ History of anxiety   '[]'$  History of major depression.  Physical Examination  BP 118/70 (BP Location: Left Arm, Patient Position: Sitting)   Pulse 76   Resp 15   Ht '5\' 7"'$  (1.702 m)   Wt 187 lb (84.8 kg)   LMP  (LMP Unknown)   BMI 29.29 kg/m  Gen:  WD/WN, NAD.  Appears younger than stated age Head: [place]/AT, No temporalis wasting. Ear/Nose/Throat: Hearing markedly diminished, nares w/o erythema or drainage Eyes: Conjunctiva clear. Sclera non-icteric Neck: Supple.  Trachea midline Pulmonary:  Good air movement, no use of accessory muscles.  Cardiac: Irregularly irregular Vascular:  Vessel Right Left  Radial Palpable Palpable                          PT  1+ palpable  1+ palpable  DP  not palpable  1+ palpable    Musculoskeletal: M/S 5/5 throughout.  No deformity or atrophy.  Using a wheelchair.  1+ right lower extremity edema, trace left lower extremity edema. Neurologic: Sensation grossly intact in extremities.  Symmetrical.  Speech is fluent.  Psychiatric: Judgment intact, Mood & affect appropriate for pt's  clinical situation. Dermatologic: No rashes or ulcers noted.  No cellulitis or open wounds.       Labs Recent Results (from the past 2160 hour(s))  Comprehensive metabolic panel     Status: Abnormal   Collection Time: 03/03/18  4:30 PM  Result Value Ref Range   Sodium 140 135 - 145 mmol/L   Potassium 4.5 3.5 - 5.1 mmol/L   Chloride 104 101 - 111 mmol/L   CO2 26 22 - 32 mmol/L   Glucose, Bld 135 (H) 65 - 99 mg/dL   BUN 20 6 - 20 mg/dL   Creatinine, Ser 1.16 (H) 0.44 - 1.00 mg/dL   Calcium 8.8 (L) 8.9 - 10.3 mg/dL   Total Protein 7.1 6.5 - 8.1 g/dL   Albumin 3.7 3.5 -  5.0 g/dL   AST 26 15 - 41 U/L   ALT 19 14 - 54 U/L   Alkaline Phosphatase 65 38 - 126 U/L   Total Bilirubin 0.4 0.3 - 1.2 mg/dL   GFR calc non Af Amer 40 (L) >60 mL/min   GFR calc Af Amer 47 (L) >60 mL/min    Comment: (NOTE) The eGFR has been calculated using the CKD EPI equation. This calculation has not been validated in all clinical situations. eGFR's persistently <60 mL/min signify possible Chronic Kidney Disease.    Anion gap 10 5 - 15    Comment: Performed at [facility], [address] [place], [place] [address]  CBC with Differential     Status: None   Collection Time: 03/03/18  4:30 PM  Result Value Ref Range   WBC 7.3 3.6 - 11.0 K/uL   RBC 4.46 3.80 - 5.20 MIL/uL   Hemoglobin 13.8 12.0 - 16.0 g/dL   HCT 42.7 35.0 - 47.0 %   MCV 95.7 80.0 - 100.0 fL   MCH 30.9 26.0 - 34.0 pg   MCHC 32.3 32.0 - 36.0 g/dL   RDW 14.2 11.5 - 14.5 %   Platelets 198 150 - 440 K/uL   Neutrophils Relative % 71 %   Neutro Abs 5.2 1.4 - 6.5 K/uL   Lymphocytes Relative 19 %   Lymphs Abs 1.4 1.0 - 3.6 K/uL   Monocytes Relative 8 %   Monocytes Absolute 0.6 0.2 - 0.9 K/uL   Eosinophils Relative 2 %   Eosinophils Absolute 0.1 0 - 0.7 K/uL   Basophils Relative 0 %   Basophils Absolute 0.0 0 - 0.1 K/uL    Comment: Performed at [facility], [address] [place], [place] [address]  Lipase, blood      Status: None   Collection Time: 03/03/18  4:30 PM  Result Value Ref Range   Lipase 30 11 - 51 U/L    Comment: Performed at [facility], [address] [place],  [address]  Urinalysis, Complete w Microscopic     Status: Abnormal   Collection Time: 03/03/18  5:03 PM  Result Value Ref Range   Color, Urine AMBER (A) YELLOW    Comment: BIOCHEMICALS MAY BE AFFECTED BY COLOR   APPearance HAZY (A) CLEAR   Specific Gravity, Urine 1.024 1.005 - 1.030   pH 5.0 5.0 - 8.0   Glucose, UA NEGATIVE NEGATIVE mg/dL   Hgb urine dipstick NEGATIVE NEGATIVE   Bilirubin Urine NEGATIVE NEGATIVE   Ketones, ur NEGATIVE NEGATIVE mg/dL   Protein, ur 30 (A) NEGATIVE mg/dL   Nitrite NEGATIVE NEGATIVE   Leukocytes, UA NEGATIVE NEGATIVE   RBC / HPF 0-5 0 - 5 RBC/hpf   WBC, UA 0-5 0 - 5 WBC/hpf   Bacteria, UA NONE SEEN NONE SEEN   Squamous Epithelial / LPF 0-5 (A) NONE SEEN    Comment: Performed at [facility], [address] [place], [place] [address]  Carbamazepine level, total     Status: None   Collection Time: 03/03/18  5:40 PM  Result Value Ref Range   Carbamazepine Lvl 6.6 4.0 - 12.0 ug/mL    Comment: Performed at [facility], [address] [place], [place] [address]  Carbamazepine level, total     Status: None   Collection Time: 03/04/18  7:55 PM  Result Value Ref Range   Carbamazepine Lvl 8.4 4.0 - 12.0 ug/mL    Comment: Performed at [facility], [address] [place], [place] [address]  Basic metabolic panel     Status: Abnormal   Collection Time: 03/04/18  7:55 PM  Result Value Ref Range   Sodium 138 135 - 145 mmol/L   Potassium 4.3 3.5 - 5.1 mmol/L   Chloride 107 101 - 111 mmol/L   CO2 26 22 - 32 mmol/L   Glucose, Bld 139 (H) 65 - 99 mg/dL   BUN 20 6 - 20 mg/dL   Creatinine, Ser 1.09 (H) 0.44 - 1.00 mg/dL   Calcium 8.1 (L) 8.9 - 10.3 mg/dL   GFR calc non Af Amer 44 (L) >60 mL/min   GFR calc Af Amer 51 (L) >60 mL/min    Comment:  (NOTE) The eGFR has been calculated using the CKD EPI equation. This calculation has not been validated in all clinical situations. eGFR's persistently <60 mL/min signify possible Chronic Kidney Disease.    Anion gap 5 5 - 15    Comment: Performed at [facility], [address] [place], [place] [address]  CBC with Differential     Status: Abnormal   Collection Time: 03/04/18  7:55 PM  Result Value Ref Range   WBC 7.5 3.6 - 11.0 K/uL   RBC 4.07 3.80 - 5.20 MIL/uL   Hemoglobin 12.6 12.0 - 16.0 g/dL   HCT 38.7 35.0 - 47.0 %   MCV 95.0 80.0 - 100.0 fL   MCH 31.0 26.0 - 34.0 pg   MCHC 32.7 32.0 - 36.0 g/dL   RDW 14.2 11.5 - 14.5 %   Platelets 126 (L) 150 - 440 K/uL   Neutrophils Relative % 74 %   Neutro Abs 5.5 1.4 - 6.5 K/uL   Lymphocytes Relative 15 %   Lymphs Abs 1.1 1.0 - 3.6 K/uL   Monocytes Relative 10 %   Monocytes Absolute 0.7 0.2 - 0.9 K/uL   Eosinophils Relative 1 %   Eosinophils Absolute 0.1 0 - 0.7 K/uL   Basophils Relative 0 %   Basophils Absolute 0.0 0 - 0.1 K/uL    Comment: Performed at [facility] [place], [address] [place], [place] [address]  Troponin I     Status: None   Collection Time: 03/04/18  7:55 PM  Result Value Ref Range   Troponin I <0.03 <0.03 ng/mL    Comment: Performed at [facility], [address] [place], [place] [address]  Troponin I     Status: None   Collection Time: 03/05/18  1:27 AM  Result Value Ref Range   Troponin I <0.03 <0.03 ng/mL    Comment: Performed at [facility], [address] [place], [place] [address]  Basic metabolic panel     Status: Abnormal   Collection Time: 03/05/18  1:27 AM  Result Value Ref Range   Sodium 139 135 - 145 mmol/L   Potassium 4.6 3.5 - 5.1 mmol/L   Chloride 108 101 - 111 mmol/L   CO2 25 22 - 32 mmol/L   Glucose, Bld 136 (H) 65 - 99 mg/dL   BUN 20 6 - 20 mg/dL   Creatinine, Ser 0.96 0.44 - 1.00 mg/dL   Calcium 8.3 (L) 8.9 - 10.3 mg/dL   GFR calc non Af  Amer 51 (L) >60 mL/min   GFR calc Af Amer 59 (L) >60 mL/min    Comment: (NOTE) The eGFR has been calculated using the CKD EPI equation. This calculation has not been validated in all clinical situations. eGFR's persistently <60  mL/min signify possible Chronic Kidney Disease.    Anion gap 6 5 - 15    Comment: Performed at [facility], [address] [place], [place] [address]  CBC     Status: Abnormal   Collection Time: 03/05/18  1:27 AM  Result Value Ref Range   WBC 8.3 3.6 - 11.0 K/uL   RBC 4.15 3.80 - 5.20 MIL/uL   Hemoglobin 12.8 12.0 - 16.0 g/dL   HCT 39.2 35.0 - 47.0 %   MCV 94.5 80.0 - 100.0 fL   MCH 30.8 26.0 - 34.0 pg   MCHC 32.6 32.0 - 36.0 g/dL   RDW 14.3 11.5 - 14.5 %   Platelets 127 (L) 150 - 440 K/uL    Comment: Performed at [facility], [address] [place], [place] [address]  Glucose, capillary     Status: Abnormal   Collection Time: 03/05/18  7:28 AM  Result Value Ref Range   Glucose-Capillary 126 (H) 65 - 99 mg/dL  CBC with Differential     Status: Abnormal   Collection Time: 03/05/18  8:30 PM  Result Value Ref Range   WBC 8.5 3.6 - 11.0 K/uL   RBC 4.27 3.80 - 5.20 MIL/uL   Hemoglobin 13.3 12.0 - 16.0 g/dL   HCT 40.5 35.0 - 47.0 %   MCV 94.8 80.0 - 100.0 fL   MCH 31.1 26.0 - 34.0 pg   MCHC 32.8 32.0 - 36.0 g/dL   RDW 14.4 11.5 - 14.5 %   Platelets 108 (L) 150 - 440 K/uL   Neutrophils Relative % 77 %   Neutro Abs 6.6 (H) 1.4 - 6.5 K/uL   Lymphocytes Relative 12 %   Lymphs Abs 1.0 1.0 - 3.6 K/uL   Monocytes Relative 10 %   Monocytes Absolute 0.9 0.2 - 0.9 K/uL   Eosinophils Relative 1 %   Eosinophils Absolute 0.0 0 - 0.7 K/uL   Basophils Relative 0 %   Basophils Absolute 0.0 0 - 0.1 K/uL    Comment: Performed at [facility], [address] [place], [place] [address]  Basic metabolic panel     Status: Abnormal   Collection Time: 03/05/18  8:30 PM  Result Value Ref Range   Sodium 137 135 - 145 mmol/L   Potassium  4.3 3.5 - 5.1 mmol/L   Chloride 107 101 - 111 mmol/L   CO2 23 22 - 32 mmol/L   Glucose, Bld 175 (H) 65 - 99 mg/dL   BUN 23 (H) 6 - 20 mg/dL   Creatinine, Ser 1.14 (H) 0.44 - 1.00 mg/dL   Calcium 7.8 (L) 8.9 - 10.3 mg/dL   GFR calc non Af Amer 41 (L) >60 mL/min   GFR calc Af Amer 48 (L) >60 mL/min    Comment: (NOTE) The eGFR has been calculated using the CKD EPI equation. This calculation has not been validated in all clinical situations. eGFR's persistently <60 mL/min signify possible Chronic Kidney Disease.    Anion gap 7 5 - 15    Comment: Performed at [facility], [address] [place], [place] [address]  Troponin I     Status: None   Collection Time: 03/05/18  8:30 PM  Result Value Ref Range   Troponin I <0.03 <0.03 ng/mL    Comment: Performed at [facility], [address] [place], [place] [address]  Basic metabolic panel     Status: Abnormal   Collection Time: 03/06/18  3:58 AM  Result  Value Ref Range   Sodium 138 135 - 145 mmol/L   Potassium 4.3 3.5 - 5.1 mmol/L   Chloride 109 101 - 111 mmol/L   CO2 23 22 - 32 mmol/L   Glucose, Bld 125 (H) 65 - 99 mg/dL   BUN 20 6 - 20 mg/dL   Creatinine, Ser 0.94 0.44 - 1.00 mg/dL   Calcium 8.0 (L) 8.9 - 10.3 mg/dL   GFR calc non Af Amer 52 (L) >60 mL/min   GFR calc Af Amer >60 >60 mL/min    Comment: (NOTE) The eGFR has been calculated using the CKD EPI equation. This calculation has not been validated in all clinical situations. eGFR's persistently <60 mL/min signify possible Chronic Kidney Disease.    Anion gap 6 5 - 15    Comment: Performed at [facility], [address] [place],  [address]  CBC     Status: Abnormal   Collection Time: 03/06/18  3:58 AM  Result Value Ref Range   WBC 7.6 3.6 - 11.0 K/uL   RBC 3.85 3.80 - 5.20 MIL/uL   Hemoglobin 11.8 (L) 12.0 - 16.0 g/dL   HCT 36.3 35.0 - 47.0 %   MCV 94.3 80.0 - 100.0 fL   MCH 30.6 26.0 - 34.0 pg   MCHC 32.4 32.0 - 36.0 g/dL    RDW 14.1 11.5 - 14.5 %   Platelets 101 (L) 150 - 440 K/uL    Comment: Performed at [facility], [address] [place],  [address]  Glucose, capillary     Status: Abnormal   Collection Time: 03/06/18  8:07 AM  Result Value Ref Range   Glucose-Capillary 121 (H) 65 - 99 mg/dL   Comment 1 Notify RN   Glucose, capillary     Status: Abnormal   Collection Time: 03/06/18 11:49 AM  Result Value Ref Range   Glucose-Capillary 146 (H) 65 - 99 mg/dL   Comment 1 Notify RN   Glucose, capillary     Status: Abnormal   Collection Time: 03/06/18  4:40 PM  Result Value Ref Range   Glucose-Capillary 117 (H) 65 - 99 mg/dL   Comment 1 Notify RN   Glucose, capillary     Status: Abnormal   Collection Time: 03/06/18  8:32 PM  Result Value Ref Range   Glucose-Capillary 138 (H) 65 - 99 mg/dL  Glucose, capillary     Status: Abnormal   Collection Time: 03/07/18  8:07 AM  Result Value Ref Range   Glucose-Capillary 126 (H) 65 - 99 mg/dL   Comment 1 Notify RN   Glucose, capillary     Status: Abnormal   Collection Time: 03/07/18 11:38 AM  Result Value Ref Range   Glucose-Capillary 128 (H) 65 - 99 mg/dL   Comment 1 Notify RN   Glucose, capillary     Status: Abnormal   Collection Time: 03/07/18  5:14 PM  Result Value Ref Range   Glucose-Capillary 135 (H) 65 - 99 mg/dL   Comment 1 Notify RN   Glucose, capillary     Status: Abnormal   Collection Time: 03/07/18  9:25 PM  Result Value Ref Range   Glucose-Capillary 137 (H) 65 - 99 mg/dL   Comment 1 Notify RN    Comment 2 Document in Chart   Glucose, capillary     Status: Abnormal   Collection Time: 03/08/18  8:07 AM  Result Value Ref Range   Glucose-Capillary 128 (H) 65 - 99 mg/dL   Comment 1  Notify RN   Glucose, capillary     Status: Abnormal   Collection Time: 03/08/18 12:16 PM  Result Value Ref Range   Glucose-Capillary 137 (H) 65 - 99 mg/dL   Comment 1 Notify RN   CBC with Differential/Platelet     Status: Abnormal   Collection  Time: 03/15/18  5:28 AM  Result Value Ref Range   WBC 8.2 3.6 - 11.0 K/uL   RBC 3.39 (L) 3.80 - 5.20 MIL/uL   Hemoglobin 10.8 (L) 12.0 - 16.0 g/dL   HCT 32.2 (L) 35.0 - 47.0 %   MCV 95.0 80.0 - 100.0 fL   MCH 31.9 26.0 - 34.0 pg   MCHC 33.6 32.0 - 36.0 g/dL   RDW 13.8 11.5 - 14.5 %   Platelets 225 150 - 440 K/uL   Neutrophils Relative % 70 %   Lymphocytes Relative 15 %   Monocytes Relative 11 %   Eosinophils Relative 3 %   Basophils Relative 1 %   Neutro Abs 5.8 1.4 - 6.5 K/uL   Lymphs Abs 1.2 1.0 - 3.6 K/uL   Monocytes Absolute 0.9 0.2 - 0.9 K/uL   Eosinophils Absolute 0.2 0 - 0.7 K/uL   Basophils Absolute 0.1 0 - 0.1 K/uL   RBC Morphology MIXED RBC POPULATION     Comment: POLYCHROMASIA PRESENT   WBC Morphology MILD LEFT SHIFT (1-5% METAS, OCC MYELO, OCC BANDS)    Smear Review      PLATELET CLUMPS NOTED ON SMEAR, COUNT APPEARS ADEQUATE    Comment: Performed at [facility], [address] [place], [place] [address]  Comprehensive metabolic panel     Status: Abnormal   Collection Time: 03/15/18  5:28 AM  Result Value Ref Range   Sodium 137 135 - 145 mmol/L   Potassium 4.5 3.5 - 5.1 mmol/L   Chloride 102 101 - 111 mmol/L   CO2 27 22 - 32 mmol/L   Glucose, Bld 105 (H) 65 - 99 mg/dL   BUN 14 6 - 20 mg/dL   Creatinine, Ser 1.03 (H) 0.44 - 1.00 mg/dL   Calcium 8.4 (L) 8.9 - 10.3 mg/dL   Total Protein 6.1 (L) 6.5 - 8.1 g/dL   Albumin 2.5 (L) 3.5 - 5.0 g/dL   AST 29 15 - 41 U/L   ALT 29 14 - 54 U/L   Alkaline Phosphatase 78 38 - 126 U/L   Total Bilirubin 0.4 0.3 - 1.2 mg/dL   GFR calc non Af Amer 47 (L) >60 mL/min   GFR calc Af Amer 54 (L) >60 mL/min    Comment: (NOTE) The eGFR has been calculated using the CKD EPI equation. This calculation has not been validated in all clinical situations. eGFR's persistently <60 mL/min signify possible Chronic Kidney Disease.    Anion gap 8 5 - 15    Comment: Performed at [facility], [address]  [place], [place] [address]  Lipid panel     Status: None   Collection Time: 03/15/18  5:28 AM  Result Value Ref Range   Cholesterol 144 0 - 200 mg/dL   Triglycerides 83 <150 mg/dL   HDL 41 >40 mg/dL   Total CHOL/HDL Ratio 3.5 RATIO   VLDL 17 0 - 40 mg/dL   LDL Cholesterol 86 0 - 99 mg/dL    Comment:        Total Cholesterol/HDL:CHD Risk Coronary Heart Disease Risk Table  Men   Women  1/2 Average Risk   3.4   3.3  Average Risk       5.0   4.4  2 X Average Risk   9.6   7.1  3 X Average Risk  23.4   11.0        Use the calculated Patient Ratio above and the CHD Risk Table to determine the patient's CHD Risk.        ATP III CLASSIFICATION (LDL):  <100     mg/dL   Optimal  100-129  mg/dL   Near or Above                    Optimal  130-159  mg/dL   Borderline  160-189  mg/dL   High  >190     mg/dL   Very High Performed at [facility], [address] [place], [place] [address]   Magnesium     Status: None   Collection Time: 03/15/18  5:28 AM  Result Value Ref Range   Magnesium 1.8 1.7 - 2.4 mg/dL    Comment: Performed at [facility], [address] [place], [place] [address]  TSH     Status: None   Collection Time: 03/15/18  5:28 AM  Result Value Ref Range   TSH 3.333 0.350 - 4.500 uIU/mL    Comment: Performed by a 3rd Generation assay with a functional sensitivity of <=0.01 uIU/mL. Performed at [facility], [address] [place], [place] [address]   Vitamin B12     Status: None   Collection Time: 03/15/18  5:28 AM  Result Value Ref Range   Vitamin B-12 716 180 - 914 pg/mL    Comment: (NOTE) This assay is not validated for testing neonatal or myeloproliferative syndrome specimens for Vitamin B12 levels. Performed at [facility], [address], [place], [place] [address]   VITAMIN D 25 Hydroxy (Vit-D Deficiency, Fractures)     Status: None   Collection Time: 03/15/18  5:28 AM  Result Value Ref Range   Vit D,  25-Hydroxy 48.1 30.0 - 100.0 ng/mL    Comment: (NOTE) Vitamin D deficiency has been defined by the [place] practice guideline as a level of serum 25-OH vitamin D less than 20 ng/mL (1,2). The Endocrine Society went on to further define vitamin D insufficiency as a level between 21 and 29 ng/mL (2). 1. IOM (Institute of Medicine). 2010. Dietary reference   intakes for calcium and D. [place]: The   [employer]. 2. Holick MF, [place], Bischoff-Ferrari HA, et al.   Evaluation, treatment, and prevention of vitamin D   deficiency: an Endocrine Society clinical practice   guideline. JCEM. 2011 Jul; 96(7):1911-30. Performed At: [facility] [place], [place] [id number] Rush Farmer MD [id number] Performed at [facility], [address] [place],  [address]   CBC     Status: Abnormal   Collection Time: 03/27/18  6:15 PM  Result Value Ref Range   WBC 8.2 3.6 - 11.0 K/uL   RBC 3.80 3.80 - 5.20 MIL/uL   Hemoglobin 12.3 12.0 - 16.0 g/dL   HCT 35.8 35.0 - 47.0 %   MCV 94.2 80.0 - 100.0 fL   MCH 32.5 26.0 - 34.0 pg   MCHC 34.5 32.0 - 36.0 g/dL   RDW 15.1 (H) 11.5 - 14.5 %   Platelets 196 150 - 440  K/uL    Comment: Performed at [facility], [address] [place], [place] [address]  Comprehensive metabolic panel     Status: Abnormal   Collection Time: 03/27/18  6:15 PM  Result Value Ref Range   Sodium 139 135 - 145 mmol/L   Potassium 4.0 3.5 - 5.1 mmol/L   Chloride 100 (L) 101 - 111 mmol/L   CO2 32 22 - 32 mmol/L   Glucose, Bld 113 (H) 65 - 99 mg/dL   BUN 20 6 - 20 mg/dL   Creatinine, Ser 1.15 (H) 0.44 - 1.00 mg/dL   Calcium 8.5 (L) 8.9 - 10.3 mg/dL   Total Protein 6.9 6.5 - 8.1 g/dL   Albumin 3.4 (L) 3.5 - 5.0 g/dL   AST 25 15 - 41 U/L   ALT 13 (L) 14 - 54 U/L   Alkaline Phosphatase 81 38 - 126 U/L   Total Bilirubin 0.3 0.3 - 1.2 mg/dL   GFR calc non Af Amer 41 (L)  >60 mL/min   GFR calc Af Amer 47 (L) >60 mL/min    Comment: (NOTE) The eGFR has been calculated using the CKD EPI equation. This calculation has not been validated in all clinical situations. eGFR's persistently <60 mL/min signify possible Chronic Kidney Disease.    Anion gap 7 5 - 15    Comment: Performed at [facility], [address] [place], [place] [address]  Troponin I     Status: None   Collection Time: 03/27/18  6:15 PM  Result Value Ref Range   Troponin I <0.03 <0.03 ng/mL    Comment: Performed at [facility], [address] [place], [place] [address]  Protime-INR     Status: None   Collection Time: 03/27/18  6:15 PM  Result Value Ref Range   Prothrombin Time 13.3 11.4 - 15.2 seconds   INR 1.02     Comment: Performed at [facility], [address] [place], [place] [address]  APTT     Status: None   Collection Time: 03/27/18  6:15 PM  Result Value Ref Range   aPTT 27 24 - 36 seconds    Comment: Performed at [facility], [address] [place], [place] [address]  Brain natriuretic peptide     Status: None   Collection Time: 03/27/18  8:21 PM  Result Value Ref Range   B Natriuretic Peptide 49.0 0.0 - 100.0 pg/mL    Comment: Performed at [facility], [address] [place],  [address]  Glucose, capillary     Status: None   Collection Time: 03/27/18  9:58 PM  Result Value Ref Range   Glucose-Capillary 91 65 - 99 mg/dL  Heparin level (unfractionated)     Status: Abnormal   Collection Time: 03/28/18  4:14 AM  Result Value Ref Range   Heparin Unfractionated 1.28 (H) 0.30 - 0.70 IU/mL    Comment:        IF HEPARIN RESULTS ARE BELOW EXPECTED VALUES, AND PATIENT DOSAGE HAS BEEN CONFIRMED, SUGGEST FOLLOW UP TESTING OF ANTITHROMBIN III LEVELS. Performed at [facility], [address] [place],  [address]   Basic metabolic panel     Status: Abnormal   Collection Time: 03/28/18  4:14 AM    Result Value Ref Range   Sodium 139 135 - 145 mmol/L   Potassium 3.2 (L) 3.5 - 5.1 mmol/L   Chloride 102 101 - 111 mmol/L   CO2 30 22 - 32 mmol/L  Glucose, Bld 104 (H) 65 - 99 mg/dL   BUN 19 6 - 20 mg/dL   Creatinine, Ser 1.04 (H) 0.44 - 1.00 mg/dL   Calcium 8.0 (L) 8.9 - 10.3 mg/dL   GFR calc non Af Amer 46 (L) >60 mL/min   GFR calc Af Amer 54 (L) >60 mL/min    Comment: (NOTE) The eGFR has been calculated using the CKD EPI equation. This calculation has not been validated in all clinical situations. eGFR's persistently <60 mL/min signify possible Chronic Kidney Disease.    Anion gap 7 5 - 15    Comment: Performed at [facility], [address] [place], [place] [address]  CBC     Status: Abnormal   Collection Time: 03/28/18  4:14 AM  Result Value Ref Range   WBC 6.8 3.6 - 11.0 K/uL   RBC 3.57 (L) 3.80 - 5.20 MIL/uL   Hemoglobin 11.3 (L) 12.0 - 16.0 g/dL   HCT 33.4 (L) 35.0 - 47.0 %   MCV 93.6 80.0 - 100.0 fL   MCH 31.7 26.0 - 34.0 pg   MCHC 33.8 32.0 - 36.0 g/dL   RDW 15.0 (H) 11.5 - 14.5 %   Platelets 178 150 - 440 K/uL    Comment: Performed at [facility], [address] [place], [place] [address]  Glucose, capillary     Status: Abnormal   Collection Time: 03/28/18  7:49 AM  Result Value Ref Range   Glucose-Capillary 101 (H) 65 - 99 mg/dL   Comment 1 Notify RN   Glucose, capillary     Status: None   Collection Time: 03/28/18 11:42 AM  Result Value Ref Range   Glucose-Capillary 90 65 - 99 mg/dL   Comment 1 Notify RN   Heparin level (unfractionated)     Status: Abnormal   Collection Time: 03/28/18  3:08 PM  Result Value Ref Range   Heparin Unfractionated 0.94 (H) 0.30 - 0.70 IU/mL    Comment:        IF HEPARIN RESULTS ARE BELOW EXPECTED VALUES, AND PATIENT DOSAGE HAS BEEN CONFIRMED, SUGGEST FOLLOW UP TESTING OF ANTITHROMBIN III LEVELS. Performed at [facility], [address] [place],  [address]   Glucose, capillary      Status: None   Collection Time: 03/28/18  4:55 PM  Result Value Ref Range   Glucose-Capillary 96 65 - 99 mg/dL   Comment 1 Notify RN   Glucose, capillary     Status: Abnormal   Collection Time: 03/28/18  9:20 PM  Result Value Ref Range   Glucose-Capillary 105 (H) 65 - 99 mg/dL  CBC     Status: Abnormal   Collection Time: 03/29/18  1:18 AM  Result Value Ref Range   WBC 6.4 3.6 - 11.0 K/uL   RBC 3.67 (L) 3.80 - 5.20 MIL/uL   Hemoglobin 11.5 (L) 12.0 - 16.0 g/dL   HCT 34.2 (L) 35.0 - 47.0 %   MCV 93.0 80.0 - 100.0 fL   MCH 31.4 26.0 - 34.0 pg   MCHC 33.8 32.0 - 36.0 g/dL   RDW 15.0 (H) 11.5 - 14.5 %   Platelets 187 150 - 440 K/uL    Comment: Performed at [facility], [address] [place], [place] [address]  Heparin level (unfractionated)     Status: Abnormal   Collection Time: 03/29/18  1:18 AM  Result Value Ref Range   Heparin Unfractionated 0.73 (H) 0.30 - 0.70 IU/mL    Comment:  IF HEPARIN RESULTS ARE BELOW EXPECTED VALUES, AND PATIENT DOSAGE HAS BEEN CONFIRMED, SUGGEST FOLLOW UP TESTING OF ANTITHROMBIN III LEVELS. Performed at [facility], [place], [place] [address]   Heparin level (unfractionated)     Status: Abnormal   Collection Time: 03/29/18  5:01 AM  Result Value Ref Range   Heparin Unfractionated 0.75 (H) 0.30 - 0.70 IU/mL    Comment:        IF HEPARIN RESULTS ARE BELOW EXPECTED VALUES, AND PATIENT DOSAGE HAS BEEN CONFIRMED, SUGGEST FOLLOW UP TESTING OF ANTITHROMBIN III LEVELS. Performed at [facility], [address] [place], [place] [address]   Glucose, capillary     Status: None   Collection Time: 03/29/18  7:33 AM  Result Value Ref Range   Glucose-Capillary 97 65 - 99 mg/dL  Glucose, capillary     Status: Abnormal   Collection Time: 03/29/18 11:30 AM  Result Value Ref Range   Glucose-Capillary 105 (H) 65 - 99 mg/dL  Glucose, capillary     Status: None   Collection Time: 03/29/18 11:58 AM   Result Value Ref Range   Glucose-Capillary 87 65 - 99 mg/dL  Glucose, capillary     Status: None   Collection Time: 03/29/18  2:24 PM  Result Value Ref Range   Glucose-Capillary 86 65 - 99 mg/dL  Glucose, capillary     Status: None   Collection Time: 03/29/18  4:38 PM  Result Value Ref Range   Glucose-Capillary 77 65 - 99 mg/dL  Glucose, capillary     Status: None   Collection Time: 03/29/18  9:22 PM  Result Value Ref Range   Glucose-Capillary 98 65 - 99 mg/dL  Heparin level (unfractionated)     Status: None   Collection Time: 03/30/18 12:09 AM  Result Value Ref Range   Heparin Unfractionated 0.43 0.30 - 0.70 IU/mL    Comment:        IF HEPARIN RESULTS ARE BELOW EXPECTED VALUES, AND PATIENT DOSAGE HAS BEEN CONFIRMED, SUGGEST FOLLOW UP TESTING OF ANTITHROMBIN III LEVELS. Performed at [facility], [address] [place], [place] [address]   Glucose, capillary     Status: None   Collection Time: 03/30/18  7:24 AM  Result Value Ref Range   Glucose-Capillary 96 65 - 99 mg/dL   Comment 1 Notify RN   CBC     Status: Abnormal   Collection Time: 03/30/18  7:45 AM  Result Value Ref Range   WBC 6.6 3.6 - 11.0 K/uL   RBC 3.27 (L) 3.80 - 5.20 MIL/uL   Hemoglobin 10.3 (L) 12.0 - 16.0 g/dL   HCT 30.7 (L) 35.0 - 47.0 %   MCV 94.0 80.0 - 100.0 fL   MCH 31.4 26.0 - 34.0 pg   MCHC 33.4 32.0 - 36.0 g/dL   RDW 15.0 (H) 11.5 - 14.5 %   Platelets 188 150 - 440 K/uL    Comment: Performed at [facility], [address] [place], [place] [address]  Basic metabolic panel     Status: Abnormal   Collection Time: 03/30/18  7:45 AM  Result Value Ref Range   Sodium 139 135 - 145 mmol/L   Potassium 3.9 3.5 - 5.1 mmol/L   Chloride 104 101 - 111 mmol/L   CO2 29 22 - 32 mmol/L   Glucose, Bld 106 (H) 65 - 99 mg/dL   BUN 16 6 - 20 mg/dL   Creatinine, Ser 1.28 (H) 0.44 - 1.00  mg/dL   Calcium 7.8 (L) 8.9 - 10.3 mg/dL   GFR calc non Af Amer 36 (L) >60 mL/min   GFR calc Af Amer  42 (L) >60 mL/min    Comment: (NOTE) The eGFR has been calculated using the CKD EPI equation. This calculation has not been validated in all clinical situations. eGFR's persistently <60 mL/min signify possible Chronic Kidney Disease.    Anion gap 6 5 - 15    Comment: Performed at [facility], [place], [place] [address]  Heparin level (unfractionated)     Status: None   Collection Time: 03/30/18  7:45 AM  Result Value Ref Range   Heparin Unfractionated 0.44 0.30 - 0.70 IU/mL    Comment:        IF HEPARIN RESULTS ARE BELOW EXPECTED VALUES, AND PATIENT DOSAGE HAS BEEN CONFIRMED, SUGGEST FOLLOW UP TESTING OF ANTITHROMBIN III LEVELS. Performed at [facility], [address] [place], [place] [address]   Glucose, capillary     Status: Abnormal   Collection Time: 03/30/18 11:40 AM  Result Value Ref Range   Glucose-Capillary 126 (H) 65 - 99 mg/dL  Glucose, capillary     Status: Abnormal   Collection Time: 03/30/18  4:46 PM  Result Value Ref Range   Glucose-Capillary 105 (H) 65 - 99 mg/dL   Comment 1 Notify RN   Glucose, capillary     Status: Abnormal   Collection Time: 03/30/18  9:23 PM  Result Value Ref Range   Glucose-Capillary 119 (H) 65 - 99 mg/dL   Comment 1 Notify RN   CBC     Status: Abnormal   Collection Time: 03/31/18  4:58 AM  Result Value Ref Range   WBC 7.0 3.6 - 11.0 K/uL   RBC 3.10 (L) 3.80 - 5.20 MIL/uL   Hemoglobin 9.9 (L) 12.0 - 16.0 g/dL   HCT 29.3 (L) 35.0 - 47.0 %   MCV 94.6 80.0 - 100.0 fL   MCH 31.8 26.0 - 34.0 pg   MCHC 33.7 32.0 - 36.0 g/dL   RDW 14.9 (H) 11.5 - 14.5 %   Platelets 205 150 - 440 K/uL    Comment: Performed at [facility], [address] [place], [place] [address]  Heparin level (unfractionated)     Status: None   Collection Time: 03/31/18  4:58 AM  Result Value Ref Range   Heparin Unfractionated 0.31 0.30 - 0.70 IU/mL    Comment:        IF HEPARIN RESULTS ARE BELOW EXPECTED VALUES,  AND PATIENT DOSAGE HAS BEEN CONFIRMED, SUGGEST FOLLOW UP TESTING OF ANTITHROMBIN III LEVELS. Performed at [facility], [address] [place], [place] [address]   Protime-INR     Status: None   Collection Time: 03/31/18  4:58 AM  Result Value Ref Range   Prothrombin Time 13.7 11.4 - 15.2 seconds   INR 1.06     Comment: Performed at [facility], [address] [place], [place] [address]  Glucose, capillary     Status: Abnormal   Collection Time: 03/31/18  7:43 AM  Result Value Ref Range   Glucose-Capillary 107 (H) 65 - 99 mg/dL   Comment 1 Notify RN   Glucose, capillary     Status: Abnormal   Collection Time: 03/31/18 11:42 AM  Result Value Ref Range   Glucose-Capillary 130 (H) 65 - 99 mg/dL   Comment 1 Notify RN   Glucose, capillary     Status: Abnormal  Collection Time: 03/31/18  4:48 PM  Result Value Ref Range   Glucose-Capillary 125 (H) 65 - 99 mg/dL   Comment 1 Notify RN   Glucose, capillary     Status: Abnormal   Collection Time: 03/31/18 10:09 PM  Result Value Ref Range   Glucose-Capillary 121 (H) 65 - 99 mg/dL   Comment 1 Notify RN   Protime-INR     Status: None   Collection Time: 04/01/18  4:52 AM  Result Value Ref Range   Prothrombin Time 14.3 11.4 - 15.2 seconds   INR 1.12     Comment: Performed at [facility], [address] [place], [place] [address]  Glucose, capillary     Status: Abnormal   Collection Time: 04/01/18  7:50 AM  Result Value Ref Range   Glucose-Capillary 105 (H) 65 - 99 mg/dL   Comment 1 Notify RN   MRSA PCR Screening     Status: None   Collection Time: 04/01/18  8:09 AM  Result Value Ref Range   MRSA by PCR NEGATIVE NEGATIVE    Comment:        The GeneXpert MRSA Assay (FDA approved for NASAL specimens only), is one component of a comprehensive MRSA colonization surveillance program. It is not intended to diagnose MRSA infection nor to guide or monitor treatment for MRSA infections. Performed at  [facility], [address] [place], [place] [address]   Glucose, capillary     Status: Abnormal   Collection Time: 04/01/18 11:39 AM  Result Value Ref Range   Glucose-Capillary 135 (H) 65 - 99 mg/dL   Comment 1 Notify RN   Glucose, capillary     Status: None   Collection Time: 04/01/18  5:02 PM  Result Value Ref Range   Glucose-Capillary 94 65 - 99 mg/dL  Comprehensive metabolic panel     Status: Abnormal   Collection Time: 04/03/18  9:49 PM  Result Value Ref Range   Sodium 137 135 - 145 mmol/L   Potassium 3.9 3.5 - 5.1 mmol/L   Chloride 100 (L) 101 - 111 mmol/L   CO2 29 22 - 32 mmol/L   Glucose, Bld 133 (H) 65 - 99 mg/dL   BUN 18 6 - 20 mg/dL   Creatinine, Ser 1.05 (H) 0.44 - 1.00 mg/dL   Calcium 7.9 (L) 8.9 - 10.3 mg/dL   Total Protein 5.8 (L) 6.5 - 8.1 g/dL   Albumin 2.6 (L) 3.5 - 5.0 g/dL   AST 36 15 - 41 U/L   ALT 19 14 - 54 U/L   Alkaline Phosphatase 69 38 - 126 U/L   Total Bilirubin 0.4 0.3 - 1.2 mg/dL   GFR calc non Af Amer 46 (L) >60 mL/min   GFR calc Af Amer 53 (L) >60 mL/min    Comment: (NOTE) The eGFR has been calculated using the CKD EPI equation. This calculation has not been validated in all clinical situations. eGFR's persistently <60 mL/min signify possible Chronic Kidney Disease.    Anion gap 8 5 - 15    Comment: Performed at [facility], [place], [place] [address]  Lactic acid, plasma     Status: None   Collection Time: 04/03/18  9:49 PM  Result Value Ref Range   Lactic Acid, Venous 1.5 0.5 - 1.9 mmol/L    Comment: Performed at [facility], [address], [place], [place] [address]  CBC with Differential     Status: Abnormal   Collection  Time: 04/03/18  9:49 PM  Result Value Ref Range   WBC 9.3 3.6 - 11.0 K/uL   RBC 3.28 (L) 3.80 - 5.20 MIL/uL   Hemoglobin 10.5 (L) 12.0 - 16.0 g/dL   HCT 30.9 (L) 35.0 - 47.0 %   MCV 94.4 80.0 - 100.0 fL   MCH 31.9 26.0 - 34.0 pg   MCHC 33.9 32.0 - 36.0 g/dL    RDW 15.5 (H) 11.5 - 14.5 %   Platelets 337 150 - 440 K/uL   Neutrophils Relative % 75 %   Neutro Abs 7.0 (H) 1.4 - 6.5 K/uL   Lymphocytes Relative 14 %   Lymphs Abs 1.3 1.0 - 3.6 K/uL   Monocytes Relative 10 %   Monocytes Absolute 0.9 0.2 - 0.9 K/uL   Eosinophils Relative 1 %   Eosinophils Absolute 0.1 0 - 0.7 K/uL   Basophils Relative 0 %   Basophils Absolute 0.0 0 - 0.1 K/uL    Comment: Performed at [facility], [address] [place], [place] [address]  Culture, blood (Routine x 2)     Status: None   Collection Time: 04/03/18  9:49 PM  Result Value Ref Range   Specimen Description BLOOD L AC    Special Requests      BOTTLES DRAWN AEROBIC AND ANAEROBIC Blood Culture adequate volume   Culture      NO GROWTH 5 DAYS Performed at [facility], [address], [place], [place] [address]    Report Status 04/08/2018 FINAL   Brain natriuretic peptide     Status: None   Collection Time: 04/03/18  9:49 PM  Result Value Ref Range   B Natriuretic Peptide 83.0 0.0 - 100.0 pg/mL    Comment: Performed at [facility], [address] [place], [place] [address]  Troponin I     Status: None   Collection Time: 04/03/18  9:49 PM  Result Value Ref Range   Troponin I <0.03 <0.03 ng/mL    Comment: Performed at [facility], [address] [place], [place] [address]  TSH     Status: Abnormal   Collection Time: 04/03/18  9:49 PM  Result Value Ref Range   TSH 9.580 (H) 0.350 - 4.500 uIU/mL    Comment: Performed by a 3rd Generation assay with a functional sensitivity of <=0.01 uIU/mL. Performed at [facility], [address] [place], [place] [address]   Lactic acid, plasma     Status: None   Collection Time: 04/03/18 11:28 PM  Result Value Ref Range   Lactic Acid, Venous 0.9 0.5 - 1.9 mmol/L    Comment: Performed at [facility], [address] [place], [place] [address]  Protime-INR     Status: Abnormal   Collection Time:  04/03/18 11:28 PM  Result Value Ref Range   Prothrombin Time 18.9 (H) 11.4 - 15.2 seconds   INR 1.60     Comment: Performed at [facility], [address] [place], [place] [address]  Urinalysis, Complete w Microscopic     Status: Abnormal   Collection Time: 04/04/18  2:20 AM  Result Value Ref Range   Color, Urine YELLOW (A) YELLOW   APPearance TURBID (A) CLEAR   Specific Gravity, Urine >1.046 (H) 1.005 - 1.030   pH 6.0 5.0 - 8.0   Glucose, UA NEGATIVE NEGATIVE mg/dL   Hgb urine dipstick SMALL (A) NEGATIVE   Bilirubin Urine NEGATIVE NEGATIVE   Ketones, ur NEGATIVE NEGATIVE mg/dL  Protein, ur 30 (A) NEGATIVE mg/dL   Nitrite POSITIVE (A) NEGATIVE   Leukocytes, UA LARGE (A) NEGATIVE   RBC / HPF 11-20 0 - 5 RBC/hpf   WBC, UA >50 (H) 0 - 5 WBC/hpf   Bacteria, UA MANY (A) NONE SEEN   Squamous Epithelial / LPF 11-20 0 - 5    Comment: Please note change in reference range.   WBC Clumps PRESENT    Mucus PRESENT    Non Squamous Epithelial 0-5 (A) NONE SEEN    Comment: Performed at [facility], [address] [place], [place] [address]  MRSA PCR Screening     Status: None   Collection Time: 04/04/18  4:28 AM  Result Value Ref Range   MRSA by PCR NEGATIVE NEGATIVE    Comment:        The GeneXpert MRSA Assay (FDA approved for NASAL specimens only), is one component of a comprehensive MRSA colonization surveillance program. It is not intended to diagnose MRSA infection nor to guide or monitor treatment for MRSA infections. Performed at [facility], [address] [place], [place] [address]   Glucose, capillary     Status: Abnormal   Collection Time: 04/04/18  4:29 AM  Result Value Ref Range   Glucose-Capillary 115 (H) 65 - 99 mg/dL  Procalcitonin - Baseline     Status: None   Collection Time: 04/04/18  5:35 AM  Result Value Ref Range   Procalcitonin <0.10 ng/mL    Comment:        Interpretation: PCT (Procalcitonin) <= 0.5 ng/mL: Systemic  infection (sepsis) is not likely. Local bacterial infection is possible. (NOTE)       Sepsis PCT Algorithm           Lower Respiratory Tract                                      Infection PCT Algorithm    ----------------------------     ----------------------------         PCT < 0.25 ng/mL                PCT < 0.10 ng/mL         Strongly encourage             Strongly discourage   discontinuation of antibiotics    initiation of antibiotics    ----------------------------     -----------------------------       PCT 0.25 - 0.50 ng/mL            PCT 0.10 - 0.25 ng/mL               OR       >80% decrease in PCT            Discourage initiation of                                            antibiotics      Encourage discontinuation           of antibiotics    ----------------------------     -----------------------------         PCT >= 0.50 ng/mL              PCT 0.26 - 0.50 ng/mL  AND        <80% decrease in PCT             Encourage initiation of                                             antibiotics       Encourage continuation           of antibiotics    ----------------------------     -----------------------------        PCT >= 0.50 ng/mL                  PCT > 0.50 ng/mL               AND         increase in PCT                  Strongly encourage                                      initiation of antibiotics    Strongly encourage escalation           of antibiotics                                     -----------------------------                                           PCT <= 0.25 ng/mL                                                 OR                                        > 80% decrease in PCT                                     Discontinue / Do not initiate                                             antibiotics Performed at [facility], [address] [place], [place] [address]   Glucose, capillary     Status: None   Collection Time:  04/04/18  7:34 AM  Result Value Ref Range   Glucose-Capillary 84 65 - 99 mg/dL  Glucose, capillary     Status: Abnormal   Collection Time: 04/04/18 11:40 AM  Result Value Ref Range   Glucose-Capillary 127 (H) 65 - 99 mg/dL  Glucose, capillary     Status: Abnormal   Collection Time: 04/04/18  4:24 PM  Result Value Ref Range   Glucose-Capillary 112 (H) 65 -  99 mg/dL  Glucose, capillary     Status: Abnormal   Collection Time: 04/04/18  9:00 PM  Result Value Ref Range   Glucose-Capillary 107 (H) 65 - 99 mg/dL   Comment 1 Notify RN    Comment 2 Document in Chart   Procalcitonin     Status: None   Collection Time: 04/05/18  4:57 AM  Result Value Ref Range   Procalcitonin <0.10 ng/mL    Comment:        Interpretation: PCT (Procalcitonin) <= 0.5 ng/mL: Systemic infection (sepsis) is not likely. Local bacterial infection is possible. (NOTE)       Sepsis PCT Algorithm           Lower Respiratory Tract                                      Infection PCT Algorithm    ----------------------------     ----------------------------         PCT < 0.25 ng/mL                PCT < 0.10 ng/mL         Strongly encourage             Strongly discourage   discontinuation of antibiotics    initiation of antibiotics    ----------------------------     -----------------------------       PCT 0.25 - 0.50 ng/mL            PCT 0.10 - 0.25 ng/mL               OR       >80% decrease in PCT            Discourage initiation of                                            antibiotics      Encourage discontinuation           of antibiotics    ----------------------------     -----------------------------         PCT >= 0.50 ng/mL              PCT 0.26 - 0.50 ng/mL               AND        <80% decrease in PCT             Encourage initiation of                                             antibiotics       Encourage continuation           of antibiotics    ----------------------------      -----------------------------        PCT >= 0.50 ng/mL                  PCT > 0.50 ng/mL               AND         increase in PCT  Strongly encourage                                      initiation of antibiotics    Strongly encourage escalation           of antibiotics                                     -----------------------------                                           PCT <= 0.25 ng/mL                                                 OR                                        > 80% decrease in PCT                                     Discontinue / Do not initiate                                             antibiotics Performed at [facility], [address] [place], [place] [address]   Glucose, capillary     Status: Abnormal   Collection Time: 04/05/18  7:30 AM  Result Value Ref Range   Glucose-Capillary 122 (H) 65 - 99 mg/dL  Glucose, capillary     Status: Abnormal   Collection Time: 04/05/18 11:51 AM  Result Value Ref Range   Glucose-Capillary 117 (H) 65 - 99 mg/dL  Urine Culture     Status: None   Collection Time: 04/05/18 12:48 PM  Result Value Ref Range   Specimen Description      URINE, RANDOM Performed at [facility], [address], [place], [place] [address]    Special Requests      Normal Performed at [facility], [address], [place], [place] [address]    Culture      NO GROWTH Performed at [facility], [place] [address], [place], [place] [address]    Report Status 04/06/2018 FINAL   Glucose, capillary     Status: Abnormal   Collection Time: 04/05/18  4:57 PM  Result Value Ref Range   Glucose-Capillary 109 (H) 65 - 99 mg/dL   Comment 1 Notify RN   Glucose, capillary     Status: Abnormal   Collection Time: 04/05/18  9:16 PM  Result Value Ref Range   Glucose-Capillary 135 (H) 65 - 99 mg/dL  Procalcitonin     Status: None   Collection Time: 04/06/18  5:28 AM  Result Value Ref Range    Procalcitonin <0.10 ng/mL    Comment:  Interpretation: PCT (Procalcitonin) <= 0.5 ng/mL: Systemic infection (sepsis) is not likely. Local bacterial infection is possible. (NOTE)       Sepsis PCT Algorithm           Lower Respiratory Tract                                      Infection PCT Algorithm    ----------------------------     ----------------------------         PCT < 0.25 ng/mL                PCT < 0.10 ng/mL         Strongly encourage             Strongly discourage   discontinuation of antibiotics    initiation of antibiotics    ----------------------------     -----------------------------       PCT 0.25 - 0.50 ng/mL            PCT 0.10 - 0.25 ng/mL               OR       >80% decrease in PCT            Discourage initiation of                                            antibiotics      Encourage discontinuation           of antibiotics    ----------------------------     -----------------------------         PCT >= 0.50 ng/mL              PCT 0.26 - 0.50 ng/mL               AND        <80% decrease in PCT             Encourage initiation of                                             antibiotics       Encourage continuation           of antibiotics    ----------------------------     -----------------------------        PCT >= 0.50 ng/mL                  PCT > 0.50 ng/mL               AND         increase in PCT                  Strongly encourage                                      initiation of antibiotics    Strongly encourage escalation           of antibiotics                                     -----------------------------  PCT <= 0.25 ng/mL                                                 OR                                        > 80% decrease in PCT                                     Discontinue / Do not initiate                                             antibiotics Performed at [facility],  [address] [place], [place] [address]   CBC     Status: Abnormal   Collection Time: 04/06/18  5:28 AM  Result Value Ref Range   WBC 6.1 3.6 - 11.0 K/uL   RBC 2.79 (L) 3.80 - 5.20 MIL/uL   Hemoglobin 9.4 (L) 12.0 - 16.0 g/dL   HCT 26.2 (L) 35.0 - 47.0 %   MCV 94.0 80.0 - 100.0 fL   MCH 33.5 26.0 - 34.0 pg   MCHC 35.7 32.0 - 36.0 g/dL   RDW 15.8 (H) 11.5 - 14.5 %   Platelets 331 150 - 440 K/uL    Comment: Performed at [facility], [address] [place], [place] [address]  Basic metabolic panel     Status: Abnormal   Collection Time: 04/06/18  5:28 AM  Result Value Ref Range   Sodium 140 135 - 145 mmol/L   Potassium 3.7 3.5 - 5.1 mmol/L   Chloride 106 101 - 111 mmol/L   CO2 28 22 - 32 mmol/L   Glucose, Bld 119 (H) 65 - 99 mg/dL   BUN 11 6 - 20 mg/dL   Creatinine, Ser 0.85 0.44 - 1.00 mg/dL   Calcium 7.4 (L) 8.9 - 10.3 mg/dL   GFR calc non Af Amer 59 (L) >60 mL/min   GFR calc Af Amer >60 >60 mL/min    Comment: (NOTE) The eGFR has been calculated using the CKD EPI equation. This calculation has not been validated in all clinical situations. eGFR's persistently <60 mL/min signify possible Chronic Kidney Disease.    Anion gap 6 5 - 15    Comment: Performed at [facility], [address] [place], [place] [address]  Protime-INR     Status: Abnormal   Collection Time: 04/06/18  5:28 AM  Result Value Ref Range   Prothrombin Time 16.3 (H) 11.4 - 15.2 seconds   INR 1.32     Comment: Performed at [facility], [address] [place], [place] [address]  Glucose, capillary     Status: Abnormal   Collection Time: 04/06/18  7:48 AM  Result Value Ref Range   Glucose-Capillary 115 (H) 65 - 99 mg/dL   Comment 1 Notify RN   Glucose, capillary     Status: Abnormal   Collection Time: 04/06/18 11:55 AM  Result Value Ref Range   Glucose-Capillary 109 (H) 65 - 99 mg/dL  Comment 1 Notify RN   Glucose, capillary     Status: Abnormal   Collection  Time: 04/06/18  4:37 PM  Result Value Ref Range   Glucose-Capillary 129 (H) 65 - 99 mg/dL   Comment 1 Notify RN   Glucose, capillary     Status: Abnormal   Collection Time: 04/06/18  9:20 PM  Result Value Ref Range   Glucose-Capillary 123 (H) 65 - 99 mg/dL  Protime-INR     Status: Abnormal   Collection Time: 04/07/18  4:51 AM  Result Value Ref Range   Prothrombin Time 16.7 (H) 11.4 - 15.2 seconds   INR 1.36     Comment: Performed at [facility], [address] [place], [place] [address]  Glucose, capillary     Status: Abnormal   Collection Time: 04/07/18  7:49 AM  Result Value Ref Range   Glucose-Capillary 103 (H) 65 - 99 mg/dL   Comment 1 Notify RN   Glucose, capillary     Status: Abnormal   Collection Time: 04/07/18 12:02 PM  Result Value Ref Range   Glucose-Capillary 122 (H) 65 - 99 mg/dL   Comment 1 Notify RN   Glucose, capillary     Status: Abnormal   Collection Time: 04/07/18  4:45 PM  Result Value Ref Range   Glucose-Capillary 118 (H) 65 - 99 mg/dL   Comment 1 Notify RN   Glucose, capillary     Status: None   Collection Time: 04/07/18  9:34 PM  Result Value Ref Range   Glucose-Capillary 90 65 - 99 mg/dL   Comment 1 Notify RN   Protime-INR     Status: Abnormal   Collection Time: 04/08/18  5:01 AM  Result Value Ref Range   Prothrombin Time 18.3 (H) 11.4 - 15.2 seconds   INR 1.53     Comment: Performed at [facility], [address] [place], [place] [address]  Glucose, capillary     Status: Abnormal   Collection Time: 04/08/18  7:51 AM  Result Value Ref Range   Glucose-Capillary 111 (H) 65 - 99 mg/dL  Glucose, capillary     Status: None   Collection Time: 04/08/18 11:50 AM  Result Value Ref Range   Glucose-Capillary 95 65 - 99 mg/dL   Comment 1 Notify RN   Protime-INR     Status: Abnormal   Collection Time: 04/09/18 11:24 AM  Result Value Ref Range   INR 1.4 (H) 0.8 - 1.2    Comment: Reference interval is for non-anticoagulated  patients. Suggested INR therapeutic range for Vitamin K antagonist therapy:    Standard Dose (moderate intensity                   therapeutic range):       2.0 - 3.0    Higher intensity therapeutic range       2.5 - 3.5    Prothrombin Time 14.4 (H) 9.1 - 12.0 sec  POCT HgB A1C     Status: None   Collection Time: 04/11/18 11:05 AM  Result Value Ref Range   Hemoglobin A1C 5.9   Vitamin B12     Status: None   Collection Time: 04/30/18  1:08 PM  Result Value Ref Range   Vitamin B-12 432 180 - 914 pg/mL    Comment: (NOTE) This assay is not validated for testing neonatal or myeloproliferative syndrome specimens for Vitamin B12 levels. Performed at [facility], [place] [address], [place], [place] [address]  Folate     Status: None   Collection Time: 04/30/18  1:08 PM  Result Value Ref Range   Folate 39.0 >5.9 ng/mL    Comment: Performed at [facility], [address] [place], [place] [address]  Ferritin     Status: None   Collection Time: 04/30/18  1:08 PM  Result Value Ref Range   Ferritin 21 11 - 307 ng/mL    Comment: Performed at [facility], [address] [place], [place] [address]  Iron and TIBC     Status: Abnormal   Collection Time: 04/30/18  1:08 PM  Result Value Ref Range   Iron 13 (L) 28 - 170 ug/dL   TIBC 189 (L) 250 - 450 ug/dL   Saturation Ratios 7 (L) 10.4 - 31.8 %   UIBC 176 ug/dL    Comment: Performed at [facility], [address] [place], [place] [address]  APTT     Status: Abnormal   Collection Time: 04/30/18  1:08 PM  Result Value Ref Range   aPTT 88 (H) 24 - 36 seconds    Comment:        IF BASELINE aPTT IS ELEVATED, SUGGEST PATIENT RISK ASSESSMENT BE USED TO DETERMINE APPROPRIATE ANTICOAGULANT THERAPY. Performed at [facility], [address] [place], [place] [address]   Protime-INR     Status: Abnormal   Collection Time: 04/30/18  1:08 PM  Result Value Ref Range   Prothrombin Time 39.4 (H)  11.4 - 15.2 seconds   INR 4.09 (HH)     Comment: RESULT REPEATED AND VERIFIED CRITICAL RESULT CALLED TO, READ BACK BY AND VERIFIED WITH: COURTNEY GARRETT 04/30/18 [place] at [facility], [address] [place], [place] [address]   CBC with Differential/Platelet     Status: Abnormal   Collection Time: 04/30/18  1:08 PM  Result Value Ref Range   WBC 7.8 3.6 - 11.0 K/uL   RBC 3.77 (L) 3.80 - 5.20 MIL/uL   Hemoglobin 11.5 (L) 12.0 - 16.0 g/dL   HCT 34.6 (L) 35.0 - 47.0 %   MCV 91.8 80.0 - 100.0 fL   MCH 30.6 26.0 - 34.0 pg   MCHC 33.3 32.0 - 36.0 g/dL   RDW 15.8 (H) 11.5 - 14.5 %   Platelets 336 150 - 440 K/uL   Neutrophils Relative % 67 %   Neutro Abs 5.2 1.4 - 6.5 K/uL   Lymphocytes Relative 20 %   Lymphs Abs 1.6 1.0 - 3.6 K/uL   Monocytes Relative 11 %   Monocytes Absolute 0.9 0.2 - 0.9 K/uL   Eosinophils Relative 1 %   Eosinophils Absolute 0.1 0 - 0.7 K/uL   Basophils Relative 1 %   Basophils Absolute 0.0 0 - 0.1 K/uL    Comment: Performed at [facility], [address] [place], [place] [address]  INR/PT     Status: Abnormal   Collection Time: 05/02/18 10:52 AM  Result Value Ref Range   INR 2.6 (H) 0.8 - 1.0 ratio   Prothrombin Time 29.8 (H) 9.6 - 13.1 sec    Radiology Ct Angio Chest Pe W And/or Wo Contrast  Result Date: 04/04/2018 CLINICAL DATA:  Fever to 102.1. EXAM: CT ANGIOGRAPHY CHEST WITH CONTRAST TECHNIQUE: Multidetector CT imaging of the chest was performed using the standard protocol during bolus administration of intravenous contrast. Multiplanar CT image reconstructions and MIPs were obtained to evaluate the vascular anatomy. CONTRAST:  16m ISOVUE-370 IOPAMIDOL (ISOVUE-370) INJECTION  76% COMPARISON:  10/08/2015 FINDINGS: Cardiovascular: The heart is top-normal in size without pericardial effusion. No acute pulmonary embolus. No aortic aneurysm or dissection. There is mild aortic atherosclerosis. Conventional branch pattern of the great  vessels off the arch. Mediastinum/Nodes: No axillary, mediastinal nor hilar lymphadenopathy. Patent trachea and mainstem bronchus. Status post thyroidectomy. 7 mm hyperdensity along the right thyroid cartilage may reflect stigmata of old remote trauma or post treatment change given thyroidectomy, series 4, image 7. This causes slight deviation of the right vocal cord medially. A small cartilaginous lesion is not entirely excluded. Lungs/Pleura: Inspissated mucus and bronchiectasis within the lower lobes bilaterally with more peripheral airspace opacities compatible with atelectasis and/or pneumonia. No pulmonary edema, effusion or pneumothorax. Upper Abdomen: No acute abnormality. Musculoskeletal: No aggressive osseous lesions. Review of the MIP images confirms the above findings. IMPRESSION: 1. Bilateral lower lobe bronchiectasis with areas of inspissated mucus. More peripheral pulmonary consolidations and atelectasis within both lower lobes as a result. Suspect small foci of pneumonia admixed with atelectasis accounting for this appearance. Consider short-term follow-up to assure improvement and/or resolution after treating for pneumonia in 4-6 weeks. 2. Status post thyroidectomy. Right thyroid cartilaginous hyperdense 7 mm focus slightly deviating the right vocal cord medially. Differential possibilities may include a nonspecific small cartilaginous lesion of the thyroid cartilage, post treatment change given thyroidectomy or possibly posttraumatic with soft tissue mineralization. Consider short-term interval follow-up of the neck in 3 months. 3. No acute pulmonary embolus. Aortic Atherosclerosis (ICD10-I70.0). Electronically Signed   By: Ashley Royalty M.D.   On: 04/04/2018 00:36   US Venous Img Upper Uni Right  Result Date: 04/04/2018 CLINICAL DATA:  Right arm swelling for 1 week. Large area of bruising and discoloration medial to the elbow and extending into the upper forearm. EXAM: Right UPPER EXTREMITY  VENOUS DOPPLER ULTRASOUND TECHNIQUE: Gray-scale sonography with graded compression, as well as color Doppler and duplex ultrasound were performed to evaluate the upper extremity deep venous system from the level of the subclavian vein and including the jugular, axillary, basilic, radial, ulnar and upper cephalic vein. Spectral Doppler was utilized to evaluate flow at rest and with distal augmentation maneuvers. COMPARISON:  None. FINDINGS: Contralateral Subclavian Vein: Respiratory phasicity is normal and symmetric with the symptomatic side. No evidence of thrombus. Normal compressibility. Internal Jugular Vein: No evidence of thrombus. Normal compressibility, respiratory phasicity and response to augmentation. Subclavian Vein: No evidence of thrombus. Normal compressibility, respiratory phasicity and response to augmentation. Axillary Vein: No evidence of thrombus. Normal compressibility, respiratory phasicity and response to augmentation. Cephalic Vein: No evidence of thrombus. Normal compressibility, respiratory phasicity and response to augmentation. Basilic Vein: No evidence of thrombus. Normal compressibility, respiratory phasicity and response to augmentation. Brachial Veins: No evidence of thrombus. Normal compressibility, respiratory phasicity and response to augmentation. Radial Veins: No evidence of thrombus. Normal compressibility, respiratory phasicity and response to augmentation. Ulnar Veins: No evidence of thrombus. Normal compressibility, respiratory phasicity and response to augmentation. Venous Reflux:  None visualized. Other Findings: In the right medial elbow region, extending to the upper forearm, there is a circumscribed mixed echotexture structure in the subcutaneous soft tissues measuring 8.7 x 2.8 x 3.8 cm. No flow is demonstrated within the lesion on color flow Doppler imaging. Appearance is most consistent with a soft tissue hematoma. IMPRESSION: No evidence of DVT within the right upper  extremity. Soft tissue hematoma in the medial right elbow region extending to the upper forearm and measuring up to 8.7 cm maximal diameter. Electronically Signed  By: Lucienne Capers M.D.   On: 04/04/2018 02:20   Dg Chest Portable 1 View  Result Date: 04/03/2018 CLINICAL DATA:  Fever of 102.1. EXAM: PORTABLE CHEST 1 VIEW COMPARISON:  CXR 10/08/2015 and chest CT 10/08/2015 FINDINGS: Normal size cardiac silhouette. Aortic atherosclerosis with uncoiling. No acute osseous abnormality. Low lung volumes with chronic interstitial prominence and bibasilar atelectasis. No pulmonary consolidation, CHF, effusion or pneumothorax. IMPRESSION: Low lung volumes with bibasilar atelectasis. Aortic atherosclerosis. Electronically Signed   By: Ashley Royalty M.D.   On: 04/03/2018 22:08    Assessment/Plan  Hypertension blood pressure control important in reducing the progression of atherosclerotic disease. On appropriate oral medications.   Diabetes mellitus, type 2 (HCC) blood glucose control important in reducing the progression of atherosclerotic disease. Also, involved in wound healing. On appropriate medications.   Acute deep vein thrombosis (DVT) of proximal vein of both lower extremities (HCC) She has had a marked improvement after venous lysis and thrombectomy several weeks ago.  Her leg swelling is markedly improved.  She is doing much better.  She will need to remain on anticoagulation with her multiple thromboses.  Elevate her legs as tolerated.  No other recommendations from a vascular standpoint at this time.    Leotis Pain, MD  05/03/2018 11:30 AM    This note was created with Dragon medical transcription system.  Any errors from dictation are purely unintentional

## 2018-05-03 NOTE — Telephone Encounter (Signed)
Order has been faxed this morning to [facility].  INR can be checked on Monday as per provider.

## 2018-05-03 NOTE — Assessment & Plan Note (Signed)
blood glucose control important in reducing the progression of atherosclerotic disease. Also, involved in wound healing. On appropriate medications.  

## 2018-05-03 NOTE — Assessment & Plan Note (Signed)
She has had a marked improvement after venous lysis and thrombectomy several weeks ago.  Her leg swelling is markedly improved.  She is doing much better.  She will need to remain on anticoagulation with her multiple thromboses.  Elevate her legs as tolerated.  No other recommendations from a vascular standpoint at this time.

## 2018-05-03 NOTE — Telephone Encounter (Unsigned)
Copied from [place] [id number]. Topic: Quick Communication - See Telephone Encounter >> May 03, 2018 11:50 AM Neva Seat wrote: [place] [phone]  INR Lab taken 05-03-18 at 11:45: 2.5  Need to know if the INR level will still need to be taken every 48 hrs or if taking the INR can be extended since pt is at a normal level.

## 2018-05-03 NOTE — Telephone Encounter (Signed)
Patients son, [name], notified of results and to continue 16mg  over the weekend, I will contact him on Monday with mondays results and further instructions.

## 2018-05-06 ENCOUNTER — Ambulatory Visit
Admission: RE | Admit: 2018-05-06 | Discharge: 2018-05-06 | Disposition: A | Discharge status: Home / Self Care | Payer: Medicare Other | Source: Ambulatory Visit | Attending: Internal Medicine | Admitting: Internal Medicine

## 2018-05-06 ENCOUNTER — Other Ambulatory Visit: Payer: Medicare Other

## 2018-05-06 ENCOUNTER — Telehealth: Payer: Self-pay | Admitting: *Deleted

## 2018-05-06 ENCOUNTER — Telehealth: Payer: Self-pay

## 2018-05-06 DIAGNOSIS — J470 Bronchiectasis with acute lower respiratory infection: Secondary | ICD-10-CM | POA: Diagnosis not present

## 2018-05-06 DIAGNOSIS — J189 Pneumonia, unspecified organism: Secondary | ICD-10-CM | POA: Diagnosis present

## 2018-05-06 DIAGNOSIS — I517 Cardiomegaly: Secondary | ICD-10-CM | POA: Diagnosis not present

## 2018-05-06 DIAGNOSIS — I2510 Atherosclerotic heart disease of native coronary artery without angina pectoris: Secondary | ICD-10-CM | POA: Insufficient documentation

## 2018-05-06 DIAGNOSIS — I288 Other diseases of pulmonary vessels: Secondary | ICD-10-CM | POA: Insufficient documentation

## 2018-05-06 DIAGNOSIS — I700 Atherosclerosis of aorta: Secondary | ICD-10-CM | POA: Insufficient documentation

## 2018-05-06 DIAGNOSIS — M5136 Other intervertebral disc degeneration, lumbar region: Secondary | ICD-10-CM | POA: Insufficient documentation

## 2018-05-06 DIAGNOSIS — M4854XA Collapsed vertebra, not elsewhere classified, thoracic region, initial encounter for fracture: Secondary | ICD-10-CM | POA: Insufficient documentation

## 2018-05-06 DIAGNOSIS — R05 Cough: Secondary | ICD-10-CM

## 2018-05-06 DIAGNOSIS — R059 Cough, unspecified: Secondary | ICD-10-CM

## 2018-05-06 LAB — PROTHROMBIN GENE MUTATION

## 2018-05-06 LAB — FACTOR 5 LEIDEN

## 2018-05-06 MED ORDER — IOHEXOL 300 MG/ML  SOLN
75.0000 mL | Freq: Once | INTRAMUSCULAR | Status: AC | PRN
  Administered 2018-05-06: 75 mL via INTRAVENOUS

## 2018-05-06 NOTE — Telephone Encounter (Signed)
Please advise patient to hold today and tomorrow's coumadin and repeat INR on 6/5

## 2018-05-06 NOTE — Telephone Encounter (Signed)
Nurse called to report that INR today is 4.5 and that she needs orders for Coumadin dosing Ms Jessica Erickson [phone]

## 2018-05-06 NOTE — Telephone Encounter (Signed)
My name is Leticia Penna, RN and I am the coumadin clinic Midwife at [facility] at [facility].  I have received an in basket request from Dr. Tasia Catchings at the [facility] asking to take over patient's coumadin management.  I did not want to move forward without discussing with patient PCP first.  If Dr. Terese Door is interested in patient coming here for management, please let me know.  As she is not our patient at [facility], I will need to have a provider here willing to sign off responsibility of visits.  If both providers are in agreement, patient will need to come here to [facility] clinic for routine INR checks and management.    Please advise if you would like to pursue patient coming to [facility] for INR management.  Thanks.

## 2018-05-06 NOTE — Telephone Encounter (Signed)
Notified pt's son, [name], of Dr Collie Siad instructions.   Notified Beth at home health to repeat labs on wed.

## 2018-05-07 ENCOUNTER — Other Ambulatory Visit: Payer: Self-pay | Admitting: Internal Medicine

## 2018-05-07 DIAGNOSIS — J180 Bronchopneumonia, unspecified organism: Secondary | ICD-10-CM

## 2018-05-07 DIAGNOSIS — I272 Pulmonary hypertension, unspecified: Secondary | ICD-10-CM

## 2018-05-07 DIAGNOSIS — J449 Chronic obstructive pulmonary disease, unspecified: Secondary | ICD-10-CM

## 2018-05-07 DIAGNOSIS — J189 Pneumonia, unspecified organism: Secondary | ICD-10-CM

## 2018-05-07 DIAGNOSIS — J470 Bronchiectasis with acute lower respiratory infection: Secondary | ICD-10-CM

## 2018-05-07 MED ORDER — LEVOFLOXACIN 750 MG PO TABS: 750.0000 mg | ORAL_TABLET | Freq: Every day | ORAL | 0 refills | Status: DC

## 2018-05-07 NOTE — Telephone Encounter (Signed)
Is this a new or old message Dr. Tasia Catchings > I would like hematology /oncology to manage INR as we discussed on the phone   [place]

## 2018-05-07 NOTE — Telephone Encounter (Signed)
Thank you for your response.  I copied you on an in basket message I continue receiving from Dr. Collie Siad CMA.  They continue to try and get her set up with me here at our coumadin clinic.  Your team lead, Juliann Pulse, informed me this am that due to patient's complexity, (and per note below) you do not wish for her to come here to our coumadin clinic.    That is perfectly fine.  I have forwarded Dr. Collie Siad CMA your response below, apparently they were not aware of your orders.  Thanks.

## 2018-05-09 ENCOUNTER — Telehealth: Payer: Self-pay | Admitting: *Deleted

## 2018-05-09 ENCOUNTER — Telehealth: Payer: Self-pay | Admitting: Internal Medicine

## 2018-05-09 NOTE — Telephone Encounter (Signed)
Jessica Erickson given VO for coumadin 10 mg daily and to repeat INR Monday, she repeated back to me.

## 2018-05-09 NOTE — Telephone Encounter (Signed)
Copied from [place] [id number]. Topic: Quick Communication - Rx Refill/Question >> May 09, 2018 12:50 PM [place], Ja-Kwan wrote: Medication: budesonide-formoterol (SYMBICORT) 160-4.5 MCG/ACT inhaler  Pt request Rx be delivered  Preferred Pharmacy (with phone number or street name): [place], [place] - [place] [phone] (Phone) [phone] (Fax)   Agent: Please be advised that RX refills may take up to 3 business days. We ask that you follow-up with your pharmacy.

## 2018-05-09 NOTE — Telephone Encounter (Signed)
Per conversation with Forde Radon at [facility], they are unable to do daily INR checks.   Agreed on INR Check 3 times weekly, if patient/family would like additional checks, a lab appt in our office is available.  Dr Tasia Catchings does not feel comfortable relying on at home testing machine due to high dose of coumadin use.    INR check schedule for tomorrow 6/7 and Monday 6/10.

## 2018-05-09 NOTE — Telephone Encounter (Signed)
Spoke with pt's daughter Wells Guiles and informed her that pt had refills available at requested pharmacy. Pt's daughter states she already set up delivery services for her mother and will contact the pharmacy in order to get a refill of this medication.

## 2018-05-09 NOTE — Telephone Encounter (Signed)
Per Dr Tasia Catchings Daily INR checks, start 10mg  coumadin today. Notified son. Left message with Forde Radon, patients case manager at [facility], to return my call.

## 2018-05-09 NOTE — Telephone Encounter (Signed)
RN called INR yesterday is 1.5. They need new orders for coumadin

## 2018-05-09 NOTE — Telephone Encounter (Unsigned)
Copied from [place] [id number]. Topic: Quick Communication - See Telephone Encounter >> May 09, 2018  9:58 AM Neva Seat wrote: Pt is still waiting on the pulmonary referral appt. She hasn't heared from them to make the appt. Pt is also needing to discuss the medications that may or may not need refills. Please call daughter Rachel Moulds - [phone].

## 2018-05-09 NOTE — Telephone Encounter (Signed)
Patient is newly started on Levaquin, which potentially interact with coumadin.  Please advise patient to take 10 mg of coumadin daily. Repeat INR on Monday. Thanks.

## 2018-05-10 ENCOUNTER — Telehealth: Payer: Self-pay | Admitting: *Deleted

## 2018-05-10 NOTE — Telephone Encounter (Signed)
Per Dr Tasia Catchings, 15mg  coumadin today, 10mg  coumadin tomorrow, 15mg  coumadin on Sunday, Repeat INR on Monday.   Notified pt's son, son confirmed patient is currently taking Levofloxacin.  Notified home health nurse Beth.

## 2018-05-10 NOTE — Telephone Encounter (Signed)
-----   Message from Shawnee Knapp, RN sent at 05/10/2018 11:42 AM EDT ----- Regarding: INR [facility] called with patient's INR results for today: 1.1

## 2018-05-10 NOTE — Telephone Encounter (Signed)
Referral is still in processing and the medication Dr. Olivia Mackie has refilled already.

## 2018-05-13 ENCOUNTER — Telehealth: Payer: Self-pay | Admitting: *Deleted

## 2018-05-13 NOTE — Telephone Encounter (Signed)
Patient regular nurse was in training this morning and another nurse drew patient INR and forgot to write stat on specimen. [facility] will not run stat now and she is asking what to do regarding warfarin / results. Per Dr Tasia Catchings, patient to take 10 mg tonight and will await results of INR tomorrow. Beth informed and states she will contact family

## 2018-05-14 ENCOUNTER — Encounter: Payer: Self-pay | Admitting: Internal Medicine

## 2018-05-14 ENCOUNTER — Other Ambulatory Visit (HOSPITAL_COMMUNITY)
Admission: RE | Admit: 2018-05-14 | Discharge: 2018-05-14 | Disposition: A | Discharge status: Home / Self Care | Payer: Medicare Other | Source: Other Acute Inpatient Hospital | Attending: Internal Medicine | Admitting: Internal Medicine

## 2018-05-14 ENCOUNTER — Other Ambulatory Visit: Payer: Self-pay | Admitting: Oncology

## 2018-05-14 ENCOUNTER — Other Ambulatory Visit: Payer: Self-pay | Admitting: *Deleted

## 2018-05-14 ENCOUNTER — Ambulatory Visit (INDEPENDENT_AMBULATORY_CARE_PROVIDER_SITE_OTHER): Payer: Medicare Other | Admitting: Internal Medicine

## 2018-05-14 VITALS — BP 108/66 | HR 94 | Resp 16 | Ht 67.0 in | Wt 184.0 lb

## 2018-05-14 DIAGNOSIS — Z7901 Long term (current) use of anticoagulants: Secondary | ICD-10-CM | POA: Insufficient documentation

## 2018-05-14 DIAGNOSIS — J479 Bronchiectasis, uncomplicated: Secondary | ICD-10-CM | POA: Diagnosis not present

## 2018-05-14 DIAGNOSIS — I824Y3 Acute embolism and thrombosis of unspecified deep veins of proximal lower extremity, bilateral: Secondary | ICD-10-CM

## 2018-05-14 LAB — PROTIME-INR
INR: 1.48
Prothrombin Time: 17.8 seconds — ABNORMAL HIGH (ref 11.4–15.2)

## 2018-05-14 MED ORDER — AMBULATORY NON FORMULARY MEDICATION: 0 refills | Status: DC

## 2018-05-14 MED ORDER — WARFARIN SODIUM 2 MG PO TABS: 2.0000 mg | ORAL_TABLET | Freq: Every day | ORAL | 2 refills | Status: DC

## 2018-05-14 NOTE — Progress Notes (Signed)
[facility] Consultation      Assessment and Plan:  Mild pulmonary fibrosis with bronchiectasis, status post pneumonia. Chronic bronchitis with excess mucus secretions.  -Discussed with patient, daughter, caregiver.  Patient appears to be improving from her recent bout of pneumonia, she has residual changes of mucus impaction, likely related to underlying bronchiectasis. - As patient appears to be improving we will not continue further antibiotics, explained that we will focus on airway clearance to try to reduce the risk of recurrent pneumonia. - She is prescribed a flutter valve today, to be used 3 times daily along with nebulizer to help mucus secretions. - Patient's underlying fibrotic/bronchiectatic changes are likely chronic, given her advanced age and lack of chronic symptoms do not require further work-up.   Date: 05/14/2018  MRN# [id number] Jessica Erickson 12/23/1925  Referring Physician:   KRYSTI Erickson is a 84 y.o. old female seen in consultation for chief complaint of:    Chief Complaint  Patient presents with  . Consult    bronchiectasis: Referred by T. Mclean-Scocuzza  . Shortness of Breath    inhaler/neb help sob with exertion  . Cough    still has mucus production  . Wheezing    occasional    HPI:   Patient had a history of provoked right femoral and popliteal DVT and PE in 2016 after an episode of fall and back procedure. S/p IVC filter placed.On 03/27/2018 she was admitted to the hospital for acute bilateral lower extremity swelling, found to have bilateral extensive lower extremity DVT.  Patient subsequently underwent chemical and mechanical thrombectomy to IVC,  right iliac veins, and left iliac veins on 4/26, discharged on 4/29.    She was again Admitted to the hospital on 04/03/2018 to 5/6 for HCAP, she underwent a CT chest on 5/2 which is suggested bronchiectasis. Subsequently she followed up with hematology, however she was requiring high  doses of Coumadin, at 19 mg, due to drug interactions.  She is present today with her daughter and caregiver because of the recent pneumonia and persistent cough and excess mucus production. She has been given a course of abx that has just completed.  She has nebulizer of albuterol three times per day which seems to help with the breathing and coughing. She takes symbicort 2 puffs twice daily, spiriva once daily, and flonase one spray once daily.  She has a cough which is improving, she brings up a lot of mucus.     **Imaging personally reviewed, CT chest 04/04/2018 in comparison with 05/06/2018; the bibasilar pneumonia changes from the previous scan appear to have significantly improved, however the patient has some residual groundglass and cystic/bronchiectatic changes in the right upper and right middle lobes which appear to have some mucus impaction.   PMHX:   Past Medical History:  Diagnosis Date  . Acoustic neuroma ([place])   . Allergy   . Asthma   . Bilateral swelling of feet    and legs  . Bladder infection   . CAD (coronary artery disease)   . Cataract   . Change in voice   . Compression fracture of body of thoracic vertebra (HCC)    T12 09/18/15 MRI s/p fall   . Constipation   . COPD (chronic obstructive pulmonary disease) ([place])    previous CXR with chronic interstitial lung dz   . CVA (cerebral vascular accident) ([place])   . Depression   . Diabetes ([place])    with neuropathy  . Diabetes mellitus,  type 2 ([place])   . Diarrhea   . Double vision   . DVT (deep venous thrombosis) ([place])    right leg 10/2015 was on coumadin off as of 2017/2018 ; s/p IVC filter  . Enuresis   . Eye pain, right   . Fall   . Fatty liver    09/15/15 also mildly dilated pancreatitic duct rec MRCP small sub cm cyst hemangioma speeln mild right hydronephrorossi and prox. hydroureter, kidney stones, mild scarring kidneys  . Female stress incontinence   . Flank pain   . GERD (gastroesophageal reflux disease)     with small hiatal hernia   . Hard of hearing   . Heart disease   . History of kidney problems   . Hyperlipidemia    mixed  . Hypertension   . Hypothyroidism, postsurgical   . Impaired mobility and ADLs    uses rolling walker has caretaker 24/7 at home  . Leg edema   . Mixed incontinence urge and stress (female)(female)   . Neuropathy   . Osteoarthritis    DDD spine   . Osteoporosis with fracture    T12 compression fracture  . Photophobia   . Pulmonary embolism ([place])    10/2015 off coumadin as of 04/2016  . Pulmonary HTN (HCC)    mild pulm HTN, echo 10/09/15 EF 37-90%WIOXB 1 dd, RV systolic pressure increased   . Recurrent UTI   . Sinus pressure   . Skin cancer    BCC jawline and scalp   . Thyroid disease    follows [facility]  . TIA (transient ischemic attack)    MRI 2009/2010 neg stroke   . Trigeminal neuralgia    Dr. Tomi Bamberger s/p gamma knife x 2, on Tegretol since 2011/2012 no increase in dose >200 mg bid rec per family per neurology   . Urinary frequency   . Urinary, incontinence, stress female    Dr Erlene Quan urology    Surgical Hx:  Past Surgical History:  Procedure Laterality Date  . APPENDECTOMY     as a child, open  . BRAIN SURGERY     schwnnoma removal 1996   . brain tumor surgery    . BREAST SURGERY     breast bx  . CATARACT EXTRACTION    . CHOLECYSTECTOMY    . EYE SURGERY     cataract  . IVC FILTER PLACEMENT ([facility] HX)     Dr. Lucky Cowboy 10/2015   . LAPAROSCOPIC TUBAL LIGATION    . MOHS SURGERY     scalp 04/2014   . PERIPHERAL VASCULAR CATHETERIZATION N/A 10/11/2015   Procedure: IVC Filter Insertion;  Surgeon: Algernon Huxley, MD;  Location: [facility];  Service: Cardiovascular;  Laterality: N/A;  . PERIPHERAL VASCULAR THROMBECTOMY Bilateral 03/29/2018   Procedure: PERIPHERAL VASCULAR THROMBECTOMY;  Surgeon: Algernon Huxley, MD;  Location: [facility];  Service: Cardiovascular;  Laterality: Bilateral;  . PUBOVAGINAL SLING    . THROAT SURGERY     . THYROID SURGERY     tumor around vocal cords   . TOOTH EXTRACTION     winter 2018   . TOTAL THYROIDECTOMY  1976   Family Hx:  Family History  Problem Relation Age of Onset  . Heart disease Mother   . Diabetes Father   . Cancer Daughter        breast ca x 2 s/p mastectomy    Social Hx:   Social History   Tobacco Use  .  Smoking status: Former Smoker    Packs/day: 0.50    Years: 20.00    Pack years: 10.00    Types: Cigarettes    Last attempt to quit: 09/20/1995    Years since quitting: 22.6  . Smokeless tobacco: Never Used  . Tobacco comment: quit 1996 smoked 20 years max 8 cig qd   Substance Use Topics  . Alcohol use: No  . Drug use: No   Medication:    Current Outpatient Medications:  .  acetaminophen (TYLENOL) 325 MG tablet, Take 650 mg by mouth every 6 (six) hours as needed., Disp: , Rfl:  .  albuterol (PROVENTIL) (2.5 MG/3ML) 0.083% nebulizer solution, Take 2.5 mg by nebulization 3 (three) times daily., Disp: , Rfl:  .  atorvastatin (LIPITOR) 40 MG tablet, Take 1 tablet (40 mg total) by mouth daily at 6 PM. Generic ok, Disp: 90 tablet, Rfl: 3 .  bisacodyl (DULCOLAX) 5 MG EC tablet, Take 1 tablet (5 mg total) by mouth daily as needed for moderate constipation., Disp: 30 tablet, Rfl: 5 .  budesonide-formoterol (SYMBICORT) 160-4.5 MCG/ACT inhaler, Inhale 2 puffs into the lungs 2 (two) times daily. Rinse mouth, Disp: 1 Inhaler, Rfl: 11 .  Calcium Carbonate-Vitamin D (CALCIUM 600+D) 600-400 MG-UNIT tablet, Take 1 tablet by mouth 2 (two) times daily. Lunch and dinner, Disp: 90 tablet, Rfl: 3 .  carbamazepine (TEGRETOL) 200 MG tablet, Take 1 tablet (200 mg total) by mouth 2 (two) times daily., Disp: 180 tablet, Rfl: 3 .  cetirizine (ZYRTEC) 10 MG tablet, Take 10 mg by mouth daily., Disp: , Rfl:  .  docusate sodium (COLACE) 100 MG capsule, Take 1 capsule (100 mg total) by mouth 2 (two) times daily as needed for mild constipation., Disp: 180 capsule, Rfl: 3 .  ferrous sulfate  325 (65 FE) MG tablet, Take 1 tablet (325 mg total) by mouth daily after lunch., Disp: 90 tablet, Rfl: 3 .  fluticasone (FLONASE) 50 MCG/ACT nasal spray, Place 1-2 sprays into both nostrils daily. Max 2 sprays, Disp: 16 g, Rfl: 11 .  furosemide (LASIX) 40 MG tablet, Take 1 tablet (40 mg total) by mouth daily., Disp: 30 tablet, Rfl: 2 .  gabapentin (NEURONTIN) 100 MG capsule, Take 1 capsule (100 mg total) by mouth 2 (two) times daily., Disp: 180 capsule, Rfl: 1 .  guaiFENesin (MUCINEX) 600 MG 12 hr tablet, Take 600 mg by mouth daily. , Disp: , Rfl:  .  levothyroxine (SYNTHROID, LEVOTHROID) 200 MCG tablet, Take 1 tablet (200 mcg total) by mouth daily before breakfast. Except on Sunday. Do not take with other medications or vitamins, Disp: 90 tablet, Rfl: 1 .  metFORMIN (GLUCOPHAGE-XR) 500 MG 24 hr tablet, Take 1 tablet (500 mg total) by mouth daily with breakfast., Disp: 90 tablet, Rfl: 3 .  Multiple Vitamin (MULTIVITAMIN WITH MINERALS) TABS tablet, Take 1 tablet by mouth daily., Disp: , Rfl:  .  pantoprazole (PROTONIX) 40 MG tablet, Take 1 tablet (40 mg total) by mouth daily. 30 minutes before lunch or dinner, Disp: 90 tablet, Rfl: 3 .  polyethylene glycol powder (GLYCOLAX/MIRALAX) powder, Take 17 g by mouth daily as needed for mild constipation., Disp: 255 g, Rfl: 11 .  polyvinyl alcohol (LIQUIFILM TEARS) 1.4 % ophthalmic solution, Place 1 drop into both eyes as needed for dry eyes., Disp: 15 mL, Rfl: 11 .  senna-docusate (SENOKOT-S) 8.6-50 MG tablet, Take 1 tablet by mouth at bedtime as needed for mild constipation., Disp: , Rfl:  .  sucralfate (CARAFATE) 1  GM/10ML suspension, Take 1 g by mouth daily as needed., Disp: , Rfl:  .  tiotropium (SPIRIVA) 18 MCG inhalation capsule, Place 1 capsule (18 mcg total) into inhaler and inhale daily., Disp: 90 capsule, Rfl: 3 .  traZODone (DESYREL) 50 MG tablet, Take 0.5 tablets (25 mg total) by mouth at bedtime as needed for sleep., Disp: 20 tablet, Rfl: 0 .   warfarin (COUMADIN) 10 MG tablet, Total 16 mg qpm (3 mg +3+ 10 mg), Disp: 90 tablet, Rfl: 0 .  warfarin (COUMADIN) 3 MG tablet, Total 16 mg per day ( 3+3+ 10 mg daily), Disp: 90 tablet, Rfl: 2   Allergies:  Penicillins; Sulfa antibiotics; Amitiza [lubiprostone]; Aspirin; and Penicillin g  Review of Systems: Gen:  Denies  fever, sweats, chills HEENT: Denies blurred vision, double vision. bleeds, sore throat Cvc:  No dizziness, chest pain. Resp:   Denies cough or sputum production, shortness of breath Gi: Denies swallowing difficulty, stomach pain. Gu:  Denies bladder incontinence, burning urine Ext:   No Joint pain, stiffness. Skin: No skin rash,  hives  Endoc:  No polyuria, polydipsia. Psych: No depression, insomnia. Other:  All other systems were reviewed with the patient and were negative other that what is mentioned in the HPI.   Physical Examination:   VS: BP 108/66 (BP Location: Left Arm, Cuff Size: Normal)   Pulse 94   Resp 16   Ht 5\' 7"  (1.702 m)   Wt 184 lb (83.5 kg)   LMP  (LMP Unknown)   SpO2 94%   BMI 28.82 kg/m   General Appearance: No distress  Neuro:without focal findings,  speech normal,  HEENT: PERRLA, EOM intact.   Pulmonary: Bibasilar fine respiratory crackles, scattered rhonchi. CardiovascularNormal S1,S2.  No m/r/g.   Abdomen: Benign, Soft, non-tender. Renal:  No costovertebral tenderness  GU:  No performed at this time. Endoc: No evident thyromegaly, no signs of acromegaly. Skin:   warm, no rashes, no ecchymosis  Extremities: normal, no cyanosis, clubbing.  Other findings:    LABORATORY PANEL:   CBC No results for input(s): WBC, HGB, HCT, PLT in the last 168 hours. ------------------------------------------------------------------------------------------------------------------  Chemistries  No results for input(s): NA, K, CL, CO2, GLUCOSE, BUN, CREATININE, CALCIUM, MG, AST, ALT, ALKPHOS, BILITOT in the last 168 hours.  Invalid input(s):  GFRCGP ------------------------------------------------------------------------------------------------------------------  Cardiac Enzymes No results for input(s): TROPONINI in the last 168 hours. ------------------------------------------------------------  RADIOLOGY:  No results found.     Thank  you for the consultation and for allowing [facility] to assist in the care of your patient. Our recommendations are noted above.  Please contact us if we can be of further service.   Marda Stalker, MD.  Board Certified in Internal Medicine, Pulmonary Medicine, [place], and Sleep Medicine.  [facility] Office Number: [phone]  Patricia Pesa, M.D.  Merton Border, M.D  05/14/2018

## 2018-05-14 NOTE — Patient Instructions (Addendum)
You have mild pulmonary fibrosis and bronchiectasis.  Do flutter valve after nebulizer three times daily, to help bring mucus out of your lung.  Continue with symbicort and spiriva.  Call back if breathing gets worse.

## 2018-05-15 ENCOUNTER — Inpatient Hospital Stay: Payer: Medicare Other

## 2018-05-15 ENCOUNTER — Telehealth: Payer: Self-pay | Admitting: *Deleted

## 2018-05-15 ENCOUNTER — Other Ambulatory Visit: Payer: Self-pay

## 2018-05-15 ENCOUNTER — Encounter: Payer: Self-pay | Admitting: Oncology

## 2018-05-15 ENCOUNTER — Other Ambulatory Visit (HOSPITAL_COMMUNITY)
Admission: RE | Admit: 2018-05-15 | Discharge: 2018-05-15 | Disposition: A | Discharge status: Home / Self Care | Payer: Medicare Other | Source: Ambulatory Visit | Attending: Internal Medicine | Admitting: Internal Medicine

## 2018-05-15 ENCOUNTER — Inpatient Hospital Stay: Payer: Medicare Other | Attending: Oncology

## 2018-05-15 ENCOUNTER — Inpatient Hospital Stay (HOSPITAL_BASED_OUTPATIENT_CLINIC_OR_DEPARTMENT_OTHER): Payer: Medicare Other | Admitting: Oncology

## 2018-05-15 VITALS — BP 118/76 | HR 74 | Temp 97.7°F | Resp 18 | Wt 183.8 lb

## 2018-05-15 DIAGNOSIS — I824Y3 Acute embolism and thrombosis of unspecified deep veins of proximal lower extremity, bilateral: Secondary | ICD-10-CM

## 2018-05-15 DIAGNOSIS — Z789 Other specified health status: Secondary | ICD-10-CM

## 2018-05-15 DIAGNOSIS — D649 Anemia, unspecified: Secondary | ICD-10-CM

## 2018-05-15 DIAGNOSIS — I10 Essential (primary) hypertension: Secondary | ICD-10-CM | POA: Insufficient documentation

## 2018-05-15 DIAGNOSIS — Z7901 Long term (current) use of anticoagulants: Secondary | ICD-10-CM

## 2018-05-15 DIAGNOSIS — Z87891 Personal history of nicotine dependence: Secondary | ICD-10-CM

## 2018-05-15 DIAGNOSIS — Z95828 Presence of other vascular implants and grafts: Secondary | ICD-10-CM | POA: Insufficient documentation

## 2018-05-15 DIAGNOSIS — E860 Dehydration: Secondary | ICD-10-CM | POA: Insufficient documentation

## 2018-05-15 DIAGNOSIS — E119 Type 2 diabetes mellitus without complications: Secondary | ICD-10-CM | POA: Insufficient documentation

## 2018-05-15 DIAGNOSIS — Z803 Family history of malignant neoplasm of breast: Secondary | ICD-10-CM

## 2018-05-15 LAB — CBC WITH DIFFERENTIAL/PLATELET
Basophils Absolute: 0 10*3/uL (ref 0–0.1)
Basophils Relative: 1 %
Eosinophils Absolute: 0.1 10*3/uL (ref 0–0.7)
Eosinophils Relative: 1 %
HCT: 35.1 % (ref 35.0–47.0)
Hemoglobin: 11.6 g/dL — ABNORMAL LOW (ref 12.0–16.0)
Lymphocytes Relative: 22 %
Lymphs Abs: 1.2 10*3/uL (ref 1.0–3.6)
MCH: 29.7 pg (ref 26.0–34.0)
MCHC: 32.9 g/dL (ref 32.0–36.0)
MCV: 90.2 fL (ref 80.0–100.0)
Monocytes Absolute: 0.6 10*3/uL (ref 0.2–0.9)
Monocytes Relative: 10 %
Neutro Abs: 3.8 10*3/uL (ref 1.4–6.5)
Neutrophils Relative %: 66 %
Platelets: 307 10*3/uL (ref 150–440)
RBC: 3.89 MIL/uL (ref 3.80–5.20)
RDW: 16.7 % — ABNORMAL HIGH (ref 11.5–14.5)
WBC: 5.7 10*3/uL (ref 3.6–11.0)

## 2018-05-15 LAB — BASIC METABOLIC PANEL
Anion gap: 10 (ref 5–15)
BUN: 15 mg/dL (ref 6–20)
CO2: 26 mmol/L (ref 22–32)
Calcium: 8.7 mg/dL — ABNORMAL LOW (ref 8.9–10.3)
Chloride: 103 mmol/L (ref 101–111)
Creatinine, Ser: 1.05 mg/dL — ABNORMAL HIGH (ref 0.44–1.00)
GFR calc Af Amer: 53 mL/min — ABNORMAL LOW (ref >60)
GFR calc non Af Amer: 45 mL/min — ABNORMAL LOW (ref >60)
Glucose, Bld: 126 mg/dL — ABNORMAL HIGH (ref 65–99)
Potassium: 4.1 mmol/L (ref 3.5–5.1)
Sodium: 139 mmol/L (ref 135–145)

## 2018-05-15 LAB — PROTIME-INR
INR: 1.35
Prothrombin Time: 16.6 seconds — ABNORMAL HIGH (ref 11.4–15.2)

## 2018-05-15 MED ORDER — SODIUM CHLORIDE 0.9 % IV SOLN: Freq: Once | INTRAVENOUS | Status: DC

## 2018-05-15 MED ORDER — ONDANSETRON HCL 4 MG/2ML IJ SOLN
4.0000 mg | Freq: Once | INTRAMUSCULAR | Status: AC
  Administered 2018-05-15: 4 mg via INTRAVENOUS
  Filled 2018-05-15: qty 2

## 2018-05-15 MED ORDER — SODIUM CHLORIDE 0.9 % IV SOLN
Freq: Once | INTRAVENOUS | Status: AC
  Administered 2018-05-15: 14:00:00 via INTRAVENOUS
  Filled 2018-05-15: qty 1000

## 2018-05-15 NOTE — Progress Notes (Signed)
Hematology/Oncology follow up note [facility] [phone] [phone]   Patient Care Team: McLean-Scocuzza, Nino Glow, MD as PCP - General (Internal Medicine)  REFERRING PROVIDER: McLean-Scocuzza, Nino Glow, MD CHIEF COMPLAINTS/PURPOSE OF CONSULTATION:  Evaluation of DVT and management of anticoagulation.   HISTORY OF PRESENTING ILLNESS:  Jessica Erickson is a  84 y.o.  female with PMH listed below who was referred to me for evaluation of DVT and chronic anticoagulation.  She has hard hearing and most history was obtained from her son and caregiver who accompanied her to clinic today.   Patient had a history of provoked right femoral and popliteal DVT and PE in 2016 after an episode of fall and back procedure. S/p IVC filter placed. She was on coumadin (19mg  daily per pcp) for anticoagulation for a period of time and eventually came off.  IVC filter was left in after a discussion between Dr.Dew and family, given her advanced age and multiple comorbidities.   Patient presented to ER for evaluation of a week of worsening LE swelling and pain.  03/29/2018 US showed extensive bilateral lower extremity DVT.  She was restarted on anticoagulation. Currently on Lovenox bridge to coumadin, per son with INR goal between 2.5-3.  Currently she is on coumadin 19mg .   04/04/2018 she had right upper extremity pain and swelling. US showed a subcutaneous soft tissue hematoma   INTERVAL HISTORY Jessica Erickson is a 84 y.o. female who has above history reviewed by me today presents for follow up visit for  management of anticoagulation.  Patient is accompanied by her daughter and son. Patient recently had pneumonia and was treated with a course of [place] outpatient.  Patient reports coughing is better.  However yesterday she vomited once.  Appetite is poor.  There is no fever or chills. Patient reports weakness. #Her Coumadin dosing has been very challenging due to the  limitation of home health care RN obtaining and communicating to [place] about INR value.  Please see previous telephone encounters. Also because patient has been on carbamazepine which interact with Coumadin metabolism, her INR has been fluctuated.  Levaquin also interact with Coumadin metabolism and to further complicate her situations.  #Patient reports mild discomfort of right thigh.  Chronic lower extremity swelling is at baseline.  Stable.  Review of Systems  Constitutional: Positive for malaise/fatigue. Negative for chills, fever and weight loss.  HENT: Positive for hearing loss. Negative for congestion, ear discharge, ear pain, nosebleeds, sinus pain and sore throat.   Eyes: Negative for double vision, photophobia, pain, discharge and redness.  Respiratory: Negative for cough, hemoptysis, sputum production, shortness of breath and wheezing.   Cardiovascular: Positive for leg swelling. Negative for chest pain, palpitations, orthopnea and claudication.  Gastrointestinal: Positive for nausea and vomiting. Negative for abdominal pain, blood in stool, heartburn and melena.  Genitourinary: Negative for dysuria, flank pain, frequency and hematuria.  Musculoskeletal: Negative for back pain, myalgias and neck pain.  Skin: Negative for itching and rash.  Neurological: Negative for dizziness, tingling, tremors, focal weakness and headaches.  Endo/Heme/Allergies: Negative for environmental allergies. Does not bruise/bleed easily.  Psychiatric/Behavioral: Negative for depression and hallucinations. The patient is not nervous/anxious.     MEDICAL HISTORY:  Past Medical History:  Diagnosis Date  . Acoustic neuroma ([place])   . Allergy   . Asthma   . Bilateral swelling of feet    and legs  . Bladder infection   . CAD (coronary artery disease)   . Cataract   .  Change in voice   . Compression fracture of body of thoracic vertebra (HCC)    T12 09/18/15 MRI s/p fall   . Constipation   . COPD (chronic  obstructive pulmonary disease) ([place])    previous CXR with chronic interstitial lung dz   . CVA (cerebral vascular accident) ([place])   . Depression   . Diabetes ([place])    with neuropathy  . Diabetes mellitus, type 2 ([place])   . Diarrhea   . Double vision   . DVT (deep venous thrombosis) ([place])    right leg 10/2015 was on coumadin off as of 2017/2018 ; s/p IVC filter  . Enuresis   . Eye pain, right   . Fall   . Fatty liver    09/15/15 also mildly dilated pancreatitic duct rec MRCP small sub cm cyst hemangioma speeln mild right hydronephrorossi and prox. hydroureter, kidney stones, mild scarring kidneys  . Female stress incontinence   . Flank pain   . GERD (gastroesophageal reflux disease)    with small hiatal hernia   . Hard of hearing   . Heart disease   . History of kidney problems   . Hyperlipidemia    mixed  . Hypertension   . Hypothyroidism, postsurgical   . Impaired mobility and ADLs    uses rolling walker has caretaker 24/7 at home  . Leg edema   . Mixed incontinence urge and stress (female)(female)   . Neuropathy   . Osteoarthritis    DDD spine   . Osteoporosis with fracture    T12 compression fracture  . Photophobia   . Pulmonary embolism ([place])    10/2015 off coumadin as of 04/2016  . Pulmonary HTN (HCC)    mild pulm HTN, echo 10/09/15 EF 23-76%EGBTD 1 dd, RV systolic pressure increased   . Recurrent UTI   . Sinus pressure   . Skin cancer    BCC jawline and scalp   . Thyroid disease    follows [facility]  . TIA (transient ischemic attack)    MRI 2009/2010 neg stroke   . Trigeminal neuralgia    Dr. Tomi Bamberger s/p gamma knife x 2, on Tegretol since 2011/2012 no increase in dose >200 mg bid rec per family per neurology   . Urinary frequency   . Urinary, incontinence, stress female    Dr Erlene Quan urology     SURGICAL HISTORY: Past Surgical History:  Procedure Laterality Date  . APPENDECTOMY     as a child, open  . BRAIN SURGERY     schwnnoma removal 1996   .  brain tumor surgery    . BREAST SURGERY     breast bx  . CATARACT EXTRACTION    . CHOLECYSTECTOMY    . EYE SURGERY     cataract  . IVC FILTER PLACEMENT ([facility] HX)     Dr. Lucky Cowboy 10/2015   . LAPAROSCOPIC TUBAL LIGATION    . MOHS SURGERY     scalp 04/2014   . PERIPHERAL VASCULAR CATHETERIZATION N/A 10/11/2015   Procedure: IVC Filter Insertion;  Surgeon: Algernon Huxley, MD;  Location: [facility];  Service: Cardiovascular;  Laterality: N/A;  . PERIPHERAL VASCULAR THROMBECTOMY Bilateral 03/29/2018   Procedure: PERIPHERAL VASCULAR THROMBECTOMY;  Surgeon: Algernon Huxley, MD;  Location: [facility];  Service: Cardiovascular;  Laterality: Bilateral;  . PUBOVAGINAL SLING    . THROAT SURGERY    . THYROID SURGERY     tumor around vocal cords   .  TOOTH EXTRACTION     winter 2018   . TOTAL THYROIDECTOMY  1976    SOCIAL HISTORY: Social History   Socioeconomic History  . Marital status: Widowed    Spouse name: Not on file  . Number of children: 5  . Years of education: Not on file  . Highest education level: Not on file  Occupational History  . Occupation: retired  Scientific laboratory technician  . Financial resource strain: Not on file  . Food insecurity:    Worry: Not on file    Inability: Not on file  . Transportation needs:    Medical: Not on file    Non-medical: Not on file  Tobacco Use  . Smoking status: Former Smoker    Packs/day: 0.50    Years: 20.00    Pack years: 10.00    Types: Cigarettes    Last attempt to quit: 09/20/1995    Years since quitting: 22.6  . Smokeless tobacco: Never Used  . Tobacco comment: quit 1996 smoked 20 years max 8 cig qd   Substance and Sexual Activity  . Alcohol use: No  . Drug use: No  . Sexual activity: Not on file  Lifestyle  . Physical activity:    Days per week: Not on file    Minutes per session: Not on file  . Stress: Not on file  Relationships  . Social connections:    Talks on phone: Not on file    Gets together: Not on file     Attends religious service: Not on file    Active member of club or organization: Not on file    Attends meetings of clubs or organizations: Not on file    Relationship status: Not on file  . Intimate partner violence:    Fear of current or ex partner: Not on file    Emotionally abused: Not on file    Physically abused: Not on file    Forced sexual activity: Not on file  Other Topics Concern  . Not on file  Social History Narrative   Lives at home with caretaker        FAMILY HISTORY: Family History  Problem Relation Age of Onset  . Heart disease Mother   . Diabetes Father   . Cancer Daughter        breast ca x 2 s/p mastectomy     ALLERGIES:  is allergic to penicillins; sulfa antibiotics; amitiza [lubiprostone]; aspirin; and penicillin g.  MEDICATIONS:  Current Outpatient Medications  Medication Sig Dispense Refill  . acetaminophen (TYLENOL) 325 MG tablet Take 650 mg by mouth every 6 (six) hours as needed.    Marland Kitchen albuterol (PROVENTIL) (2.5 MG/3ML) 0.083% nebulizer solution Take 2.5 mg by nebulization 3 (three) times daily.    . AMBULATORY NON FORMULARY MEDICATION Medication Name: Please dispense flutter valve to use three times daily after nebulization. DX:J47.9 1 each 0  . atorvastatin (LIPITOR) 40 MG tablet Take 1 tablet (40 mg total) by mouth daily at 6 PM. Generic ok 90 tablet 3  . bisacodyl (DULCOLAX) 5 MG EC tablet Take 1 tablet (5 mg total) by mouth daily as needed for moderate constipation. 30 tablet 5  . budesonide-formoterol (SYMBICORT) 160-4.5 MCG/ACT inhaler Inhale 2 puffs into the lungs 2 (two) times daily. Rinse mouth 1 Inhaler 11  . Calcium Carbonate-Vitamin D (CALCIUM 600+D) 600-400 MG-UNIT tablet Take 1 tablet by mouth 2 (two) times daily. Lunch and dinner 90 tablet 3  . carbamazepine (TEGRETOL) 200 MG tablet Take  1 tablet (200 mg total) by mouth 2 (two) times daily. 180 tablet 3  . cetirizine (ZYRTEC) 10 MG tablet Take 10 mg by mouth daily.    Marland Kitchen docusate sodium  (COLACE) 100 MG capsule Take 1 capsule (100 mg total) by mouth 2 (two) times daily as needed for mild constipation. 180 capsule 3  . ferrous sulfate 325 (65 FE) MG tablet Take 1 tablet (325 mg total) by mouth daily after lunch. 90 tablet 3  . fluticasone (FLONASE) 50 MCG/ACT nasal spray Place 1-2 sprays into both nostrils daily. Max 2 sprays 16 g 11  . furosemide (LASIX) 40 MG tablet Take 1 tablet (40 mg total) by mouth daily. 30 tablet 2  . gabapentin (NEURONTIN) 100 MG capsule Take 1 capsule (100 mg total) by mouth 2 (two) times daily. 180 capsule 1  . guaiFENesin (MUCINEX) 600 MG 12 hr tablet Take 600 mg by mouth daily.     Marland Kitchen levothyroxine (SYNTHROID, LEVOTHROID) 200 MCG tablet Take 1 tablet (200 mcg total) by mouth daily before breakfast. Except on Sunday. Do not take with other medications or vitamins 90 tablet 1  . metFORMIN (GLUCOPHAGE-XR) 500 MG 24 hr tablet Take 1 tablet (500 mg total) by mouth daily with breakfast. 90 tablet 3  . Multiple Vitamin (MULTIVITAMIN WITH MINERALS) TABS tablet Take 1 tablet by mouth daily.    . pantoprazole (PROTONIX) 40 MG tablet Take 1 tablet (40 mg total) by mouth daily. 30 minutes before lunch or dinner 90 tablet 3  . polyethylene glycol powder (GLYCOLAX/MIRALAX) powder Take 17 g by mouth daily as needed for mild constipation. 255 g 11  . polyvinyl alcohol (LIQUIFILM TEARS) 1.4 % ophthalmic solution Place 1 drop into both eyes as needed for dry eyes. 15 mL 11  . senna-docusate (SENOKOT-S) 8.6-50 MG tablet Take 1 tablet by mouth at bedtime as needed for mild constipation.    . sucralfate (CARAFATE) 1 GM/10ML suspension Take 1 g by mouth daily as needed.    . tiotropium (SPIRIVA) 18 MCG inhalation capsule Place 1 capsule (18 mcg total) into inhaler and inhale daily. 90 capsule 3  . traZODone (DESYREL) 50 MG tablet Take 0.5 tablets (25 mg total) by mouth at bedtime as needed for sleep. 20 tablet 0  . warfarin (COUMADIN) 10 MG tablet Total 16 mg qpm (3 mg +3+ 10  mg) 90 tablet 0  . warfarin (COUMADIN) 2 MG tablet Take 1 tablet (2 mg total) by mouth daily. Take with 10mg  for a total of 12mg  daily. 30 tablet 2   No current facility-administered medications for this visit.      PHYSICAL EXAMINATION: ECOG PERFORMANCE STATUS: 3 - Symptomatic, >50% confined to bed Vitals:   05/15/18 1150  BP: 118/76  Pulse: 74  Resp: 18  Temp: 97.7 F (36.5 C)   Filed Weights   05/15/18 1150  Weight: 183 lb 12.8 oz (83.4 kg)    Physical Exam  Constitutional: She is oriented to person, place, and time. She appears well-developed and well-nourished. No distress.  Sitting in wheel chair.   HENT:  Head: Normocephalic and atraumatic.  Right Ear: External ear normal.  Left Ear: External ear normal.  Mouth/Throat: Oropharynx is clear and moist.  Eyes: Pupils are equal, round, and reactive to light. Conjunctivae and EOM are normal. No scleral icterus.  pale  Neck: Normal range of motion. Neck supple.  Cardiovascular: Normal rate, regular rhythm and normal heart sounds.  Pulmonary/Chest: Effort normal and breath sounds normal. No respiratory  distress. She has no wheezes. She has no rales. She exhibits no tenderness.  Abdominal: Soft. Bowel sounds are normal. She exhibits no distension and no mass. There is no tenderness.  Musculoskeletal: Normal range of motion. She exhibits no edema or deformity.  Lymphadenopathy:    She has no cervical adenopathy.  Neurological: She is alert and oriented to person, place, and time. No cranial nerve deficit. Coordination normal.  Skin: Skin is warm and dry. No rash noted.  Psychiatric: She has a normal mood and affect. Her behavior is normal. Thought content normal.     LABORATORY DATA:  I have reviewed the data as listed Lab Results  Component Value Date   WBC 5.7 05/15/2018   HGB 11.6 (L) 05/15/2018   HCT 35.1 05/15/2018   MCV 90.2 05/15/2018   PLT 307 05/15/2018   Recent Labs    03/15/18 0528 03/27/18 1815   04/03/18 2149 04/06/18 0528 05/15/18 1116  NA 137 139   < > 137 140 139  K 4.5 4.0   < > 3.9 3.7 4.1  CL 102 100*   < > 100* 106 103  CO2 27 32   < > 29 28 26   GLUCOSE 105* 113*   < > 133* 119* 126*  BUN 14 20   < > 18 11 15   CREATININE 1.03* 1.15*   < > 1.05* 0.85 1.05*  CALCIUM 8.4* 8.5*   < > 7.9* 7.4* 8.7*  GFRNONAA 47* 41*   < > 46* 59* 45*  GFRAA 54* 47*   < > 53* >60 53*  PROT 6.1* 6.9  --  5.8*  --   --   ALBUMIN 2.5* 3.4*  --  2.6*  --   --   AST 29 25  --  36  --   --   ALT 29 13*  --  19  --   --   ALKPHOS 78 81  --  69  --   --   BILITOT 0.4 0.3  --  0.4  --   --    < > = values in this interval not displayed.      ASSESSMENT & PLAN:  1. Acute deep vein thrombosis (DVT) of proximal vein of both lower extremities (HCC)   2. Dehydration   3. Anemia, unspecified type   4. Drug interaction   5. Presence of IVC filter    # Recurrent acute extensive bilateral occlusive lower extremity DVTm through femoral vein and popliteal vein:  Given that patient is symptomatic with huge clot burden, chronic anticoagulation with Coumadin is decided..  Status post IVC filter. DOACs are not options due to interactions. She has been on Coumadin for anticoagulation.  Due to the fact that she is being on carbamazepine, INR fluctuates and she has been on high dose of Coumadin ranging from 10mg  to 19 mg. Patient's PCP does not feel comfortable managing patient's Coumadin, nor does PCP want Coumadin clinic to manage patient's INR.  I have previously discussed with PCP that cancer center will only manage her anticoagulation until stabilized and PCP will take over and continue anticoagulation.  PCP agreed.    Patient has home health care RN who supposed to come to measure her INR 3 times a week until INR stabilized.  However, it has been challenging. See telephone encounters.   I explained the situation to patient's son and daughter about the challenge were facing to manage her  anticoagulation. Patient's son and daughter agree about  giving another week.  If continues to have difficulties managing patient INR,  Will switch to subcutaneous Lovenox 1.5 mg/kg daily.  05/10/2018 INR 1.1 advise patient to take 15 mg alternating with 10 mg every other day.(on Levaquin) 05/13/2018 INR supposed to be checked by [facility] RN, however, there was an error.  Patient was advised to take 10mg   05/14/2018 [facility] RN did not obtain INR until we called in late afternoon. Advised patient to take 10mg  as there has not been INR available. Later INR was check and it was 1.48.  6/12 INR 1.35, advise patient to take 15mg  daily, repeat INR on 6/14 for further dosing.   # Anemia: appear to have early iron deficiency. Advise patient to take iron supplementation. ferrous sulfate 325mg  BID  Follow up visit in 1 months.   All questions were answered. The patient knows to call the clinic with any problems questions or concerns.  Return of visit: 1 week  Thank you for this kind referral and the opportunity to participate in the care of this patient. A copy of today's note is routed to referring provider  Total face to face encounter time for this patient visit was 40 min. >50% of the time was  spent in counseling and coordination of care.    Earlie Server, MD, PhD Hematology Oncology [facility] Pager- [phone] 05/15/2018

## 2018-05-15 NOTE — Telephone Encounter (Signed)
Jessica Erickson called to report that INR is 1.5. Pease advise of warfarin dose

## 2018-05-15 NOTE — Telephone Encounter (Signed)
No INR results from home health received yesterday.    Called Beth at [facility] yesterday, she said no one went to the house to retest yesterday.    Per Dr Tasia Catchings, advised pt's son, [name], to have pt take 10mg  coumadin yesterday and patient needs to be seen in our office today.  Patients son agreed and appt time was given.

## 2018-05-15 NOTE — Progress Notes (Signed)
Patient here today for follow. C/o pain to right inner thigh the night before last.Per son  " trigeminal acting up to right side of her face"

## 2018-05-16 ENCOUNTER — Telehealth: Payer: Self-pay | Admitting: *Deleted

## 2018-05-16 NOTE — Telephone Encounter (Signed)
Per D rYu she had said she would give nausea medications with her hydration yesterday and she did, they were instructed to go to ER if nausea continued. Son informed of tis and said , "so we need to really go to ER?' I replied yes

## 2018-05-16 NOTE — Telephone Encounter (Signed)
Patient was to get nausea medicine yesterday and the pharmacy never got prescription from [place]. She is vomiting again this morning and needs medicine. Please advise

## 2018-05-16 NOTE — Telephone Encounter (Signed)
Patient was seen by Dr Tasia Catchings yesterday and advised dose of coumadin to take.

## 2018-05-17 ENCOUNTER — Ambulatory Visit: Payer: Medicare Other | Admitting: Internal Medicine

## 2018-05-17 ENCOUNTER — Telehealth: Payer: Self-pay | Admitting: *Deleted

## 2018-05-17 ENCOUNTER — Encounter: Payer: Self-pay | Admitting: Internal Medicine

## 2018-05-17 VITALS — BP 134/76 | HR 87 | Temp 98.3°F | Ht 67.0 in | Wt 184.2 lb

## 2018-05-17 DIAGNOSIS — F32A Depression, unspecified: Secondary | ICD-10-CM

## 2018-05-17 DIAGNOSIS — I824Y3 Acute embolism and thrombosis of unspecified deep veins of proximal lower extremity, bilateral: Secondary | ICD-10-CM

## 2018-05-17 DIAGNOSIS — E119 Type 2 diabetes mellitus without complications: Secondary | ICD-10-CM | POA: Diagnosis not present

## 2018-05-17 DIAGNOSIS — F329 Major depressive disorder, single episode, unspecified: Secondary | ICD-10-CM | POA: Diagnosis not present

## 2018-05-17 DIAGNOSIS — J189 Pneumonia, unspecified organism: Secondary | ICD-10-CM

## 2018-05-17 MED ORDER — MIRTAZAPINE 15 MG PO TABS: 15.0000 mg | ORAL_TABLET | Freq: Every day | ORAL | 2 refills | Status: DC

## 2018-05-17 MED ORDER — ONDANSETRON HCL 4 MG PO TABS: 4.0000 mg | ORAL_TABLET | Freq: Three times a day (TID) | ORAL | 1 refills | Status: DC | PRN

## 2018-05-17 MED ORDER — WARFARIN SODIUM 10 MG PO TABS: ORAL_TABLET | ORAL | 0 refills | Status: DC

## 2018-05-17 MED ORDER — WARFARIN SODIUM 5 MG PO TABS: ORAL_TABLET | ORAL | 0 refills | Status: DC

## 2018-05-17 NOTE — Patient Instructions (Addendum)
Zofran 4 mg as needed up to 3x per day for nausea   Remeron 15 mg 1 pill at night  Increase water intake  We will repeat CT chest in 3 months  Use flutter valve 3x per day    Mirtazapine tablets What is this medicine? MIRTAZAPINE (mir TAZ a peen) is used to treat depression. This medicine may be used for other purposes; ask your health care provider or pharmacist if you have questions. COMMON BRAND NAME(S): Remeron What should I tell my health care provider before I take this medicine? They need to know if you have any of these conditions: -bipolar disorder -glaucoma -kidney disease -liver disease -suicidal thoughts -an unusual or allergic reaction to mirtazapine, other medicines, foods, dyes, or preservatives -pregnant or trying to get pregnant -breast-feeding How should I use this medicine? Take this medicine by mouth with a glass of water. Follow the directions on the prescription label. Take your medicine at regular intervals. Do not take your medicine more often than directed. Do not stop taking this medicine suddenly except upon the advice of your doctor. Stopping this medicine too quickly may cause serious side effects or your condition may worsen. A special MedGuide will be given to you by the pharmacist with each prescription and refill. Be sure to read this information carefully each time. Talk to your pediatrician regarding the use of this medicine in children. Special care may be needed. Overdosage: If you think you have taken too much of this medicine contact a poison control center or emergency room at once. NOTE: This medicine is only for you. Do not share this medicine with others. What if I miss a dose? If you miss a dose, take it as soon as you can. If it is almost time for your next dose, take only that dose. Do not take double or extra doses. What may interact with this medicine? Do not take this medicine with any of the following medications: -linezolid -MAOIs  like Carbex, Eldepryl, Marplan, Nardil, and Parnate -methylene blue (injected into a vein) This medicine may also interact with the following medications: -alcohol -antiviral medicines for HIV or AIDS -certain medicines that treat or prevent blood clots like warfarin -certain medicines for depression, anxiety, or psychotic disturbances -certain medicines for fungal infections like ketoconazole and itraconazole -certain medicines for migraine headache like almotriptan, eletriptan, frovatriptan, naratriptan, rizatriptan, sumatriptan, zolmitriptan -certain medicines for seizures like carbamazepine or phenytoin -certain medicines for sleep -cimetidine -erythromycin -fentanyl -lithium -medicines for blood pressure -nefazodone -rasagiline -rifampin -supplements like St. [name]'s wort, kava kava, valerian -tramadol -tryptophan This list may not describe all possible interactions. Give your health care provider a list of all the medicines, herbs, non-prescription drugs, or dietary supplements you use. Also tell them if you smoke, drink alcohol, or use illegal drugs. Some items may interact with your medicine. What should I watch for while using this medicine? Tell your doctor if your symptoms do not get better or if they get worse. Visit your doctor or health care professional for regular checks on your progress. Because it may take several weeks to see the full effects of this medicine, it is important to continue your treatment as prescribed by your doctor. Patients and their families should watch out for new or worsening thoughts of suicide or depression. Also watch out for sudden changes in feelings such as feeling anxious, agitated, panicky, irritable, hostile, aggressive, impulsive, severely restless, overly excited and hyperactive, or not being able to sleep. If this happens, especially  at the beginning of treatment or after a change in dose, call your health care professional. Dennis Bast may get  drowsy or dizzy. Do not drive, use machinery, or do anything that needs mental alertness until you know how this medicine affects you. Do not stand or sit up quickly, especially if you are an older patient. This reduces the risk of dizzy or fainting spells. Alcohol may interfere with the effect of this medicine. Avoid alcoholic drinks. This medicine may cause dry eyes and blurred vision. If you wear contact lenses you may feel some discomfort. Lubricating drops may help. See your eye doctor if the problem does not go away or is severe. Your mouth may get dry. Chewing sugarless gum or sucking hard candy, and drinking plenty of water may help. Contact your doctor if the problem does not go away or is severe. What side effects may I notice from receiving this medicine? Side effects that you should report to your doctor or health care professional as soon as possible: -allergic reactions like skin rash, itching or hives, swelling of the face, lips, or tongue -anxious -changes in vision -chest pain -confusion -elevated mood, decreased need for sleep, racing thoughts, impulsive behavior -eye pain -fast, irregular heartbeat -feeling faint or lightheaded, falls -feeling agitated, angry, or irritable -fever or chills, sore throat -hallucination, loss of contact with reality -loss of balance or coordination -mouth sores -redness, blistering, peeling or loosening of the skin, including inside the mouth -restlessness, pacing, inability to keep still -seizures -stiff muscles -suicidal thoughts or other mood changes -trouble passing urine or change in the amount of urine -trouble sleeping -unusual bleeding or bruising -unusually weak or tired -vomiting Side effects that usually do not require medical attention (report to your doctor or health care professional if they continue or are bothersome): -change in appetite -constipation -dizziness -dry mouth -muscle aches or  pains -nausea -tired -weight gain This list may not describe all possible side effects. Call your doctor for medical advice about side effects. You may report side effects to FDA at 1-800-FDA-1088. Where should I keep my medicine? Keep out of the reach of children. Store at room temperature between 15 and 30 degrees C (59 and 86 degrees F) Protect from light and moisture. Throw away any unused medicine after the expiration date. NOTE: This sheet is a summary. It may not cover all possible information. If you have questions about this medicine, talk to your doctor, pharmacist, or health care provider.  2018 Elsevier/Gold Standard (2016-04-20 17:30:45)  Criss Rosales Diet A bland diet consists of foods that do not have a lot of fat or fiber. Foods without fat or fiber are easier for the body to digest. They are also less likely to irritate your mouth, throat, stomach, and other parts of your gastrointestinal tract. A bland diet is sometimes called a BRAT diet. What is my plan? Your health care provider or dietitian may recommend specific changes to your diet to prevent and treat your symptoms, such as:  Eating small meals often.  Cooking food until it is soft enough to chew easily.  Chewing your food well.  Drinking fluids slowly.  Not eating foods that are very spicy, sour, or fatty.  Not eating citrus fruits, such as oranges and grapefruit.  What do I need to know about this diet?  Eat a variety of foods from the bland diet food list.  Do not follow a bland diet longer than you have to.  Ask your health care provider whether  you should take vitamins. What foods can I eat? Grains  Hot cereals, such as cream of wheat. Bread, crackers, or tortillas made from refined white flour. Rice. Vegetables Canned or cooked vegetables. Mashed or boiled potatoes. Fruits Bananas. Applesauce. Other types of cooked or canned fruit with the skin and seeds removed, such as canned peaches or  pears. Meats and Other Protein Sources Scrambled eggs. Creamy peanut butter or other nut butters. Lean, well-cooked meats, such as chicken or fish. Tofu. Soups or broths. Dairy Low-fat dairy products, such as milk, cottage cheese, or yogurt. Beverages Water. Herbal tea. Apple juice. Sweets and Desserts Pudding. Custard. Fruit gelatin. Ice cream. Fats and Oils Mild salad dressings. Canola or olive oil. The items listed above may not be a complete list of allowed foods or beverages. Contact your dietitian for more options. What foods are not recommended? Foods and ingredients that are often not recommended include:  Spicy foods, such as hot sauce or salsa.  Fried foods.  Sour foods, such as pickled or fermented foods.  Raw vegetables or fruits, especially citrus or berries.  Caffeinated drinks.  Alcohol.  Strongly flavored seasonings or condiments.  The items listed above may not be a complete list of foods and beverages that are not allowed. Contact your dietitian for more information. This information is not intended to replace advice given to you by your health care provider. Make sure you discuss any questions you have with your health care provider. Document Released: 03/13/2016 Document Revised: 04/27/2016 Document Reviewed: 12/02/2014 Elsevier Interactive Patient Education  2018 [employer].  Nausea, Adult Nausea is the feeling of an upset stomach or having to vomit. Nausea on its own is not usually a serious concern, but it may be an early sign of a more serious medical problem. As nausea gets worse, it can lead to vomiting. If vomiting develops, or if you are not able to drink enough fluids, you are at risk of becoming dehydrated. Dehydration can make you tired and thirsty, cause you to have a dry mouth, and decrease how often you urinate. Older adults and people with other diseases or a weak immune system are at higher risk for dehydration. The main goals of treating your  nausea are:  To limit repeated nausea episodes.  To prevent vomiting and dehydration.  Follow these instructions at home: Follow instructions from your health care provider about how to care for yourself at home. Eating and drinking Follow these recommendations as told by your health care provider:  Take an oral rehydration solution (ORS). This is a drink that is sold at pharmacies and retail stores.  Drink clear fluids in small amounts as you are able. Clear fluids include water, ice chips, diluted fruit juice, and low-calorie sports drinks.  Eat bland, easy-to-digest foods in small amounts as you are able. These foods include bananas, applesauce, rice, lean meats, toast, and crackers.  Avoid drinking fluids that contain a lot of sugar or caffeine, such as energy drinks, sports drinks, and soda.  Avoid alcohol.  Avoid spicy or fatty foods.  General instructions  Drink enough fluid to keep your urine clear or pale yellow.  Wash your hands often. If soap and water are not available, use hand sanitizer.  Make sure that all people in your household wash their hands well and often.  Rest at home while you recover.  Take over-the-counter and prescription medicines only as told by your health care provider.  Breathe slowly and deeply when you feel nauseous.  Watch your condition for any changes.  Keep all follow-up visits as told by your health care provider. This is important. Contact a health care provider if:  You have a headache.  You have new symptoms.  Your nausea gets worse.  You have a fever.  You feel light-headed or dizzy.  You vomit.  You cannot keep fluids down. Get help right away if:  You have pain in your chest, neck, arm, or jaw.  You feel extremely weak or you faint.  You have vomit that is bright red or looks like coffee grounds.  You have bloody or black stools or stools that look like tar.  You have a severe headache, a stiff neck, or  both.  You have severe pain, cramping, or bloating in your abdomen.  You have a rash.  You have difficulty breathing or are breathing very quickly.  Your heart is beating very quickly.  Your skin feels cold and clammy.  You feel confused.  You have pain when you urinate.  You have signs of dehydration, such as: ? Dark urine, very little, or no urine. ? Cracked lips. ? Dry mouth. ? Sunken eyes. ? Sleepiness. ? Weakness. These symptoms may represent a serious problem that is an emergency. Do not wait to see if the symptoms will go away. Get medical help right away. Call your local emergency services (911 in the [place] Do not drive yourself to the hospital. This information is not intended to replace advice given to you by your health care provider. Make sure you discuss any questions you have with your health care provider. Document Released: 12/28/2004 Document Revised: 04/24/2016 Document Reviewed: 07/27/2015 Elsevier Interactive Patient Education  [employer].

## 2018-05-17 NOTE — Progress Notes (Signed)
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  mg/dL   High  >190     mg/dL   Very High Performed at [facility], [address] [place], [place] [address]   Magnesium     Status: None   Collection Time: 03/15/18  5:28 AM  Result Value Ref Range   Magnesium 1.8 1.7 - 2.4 mg/dL    Comment: Performed at [facility], [address] [place], [place] [address]  TSH     Status: None   Collection Time: 03/15/18  5:28 AM  Result Value Ref Range   TSH 3.333 0.350 - 4.500 uIU/mL    Comment: Performed by a 3rd Generation assay with a  functional sensitivity of <=0.01 uIU/mL. Performed at [facility], [address] [place], [place] [address]   Vitamin B12     Status: None   Collection Time: 03/15/18  5:28 AM  Result Value Ref Range   Vitamin B-12 716 180 - 914 pg/mL    Comment: (NOTE) This assay is not validated for testing neonatal or myeloproliferative syndrome specimens for Vitamin B12 levels. Performed at [facility], [address], [place], [place] [address]   VITAMIN D 25 Hydroxy (Vit-D Deficiency, Fractures)     Status: None   Collection Time: 03/15/18  5:28 AM  Result Value Ref Range   Vit D, 25-Hydroxy 48.1 30.0 - 100.0 ng/mL    Comment: (NOTE) Vitamin D deficiency has been defined by the [place] practice guideline as a level of serum 25-OH vitamin D less than 20 ng/mL (1,2). The Endocrine Society went on to further define vitamin D insufficiency as a level between 21 and 29 ng/mL (2). 1. IOM (Institute of Medicine). 2010. Dietary reference   intakes for calcium and D. [place]: The   [employer]. 2. Holick MF, Binkley [place], Bischoff-Ferrari HA, et al.   Evaluation, treatment, and prevention of vitamin D   deficiency: an Endocrine Society clinical practice   guideline. JCEM. 2011 Jul; 96(7):1911-30. Performed At: [facility] [place], [place] [id number] Rush Farmer MD [id number] Performed at [facility], [address] [place], [place] [address]   CBC     Status: Abnormal   Collection Time: 03/27/18  6:15 PM  Result Value Ref Range   WBC 8.2 3.6 - 11.0 K/uL   RBC 3.80 3.80 - 5.20 MIL/uL   Hemoglobin 12.3 12.0 - 16.0 g/dL   HCT 35.8 35.0 - 47.0 %   MCV 94.2 80.0 - 100.0 fL   MCH 32.5 26.0 - 34.0 pg   MCHC 34.5 32.0 - 36.0 g/dL   RDW 15.1 (H) 11.5 - 14.5 %   Platelets 196 150 - 440 K/uL    Comment: Performed at [facility], [address] [place], [place] [address]   Comprehensive metabolic panel     Status: Abnormal   Collection Time: 03/27/18  6:15 PM  Result Value Ref Range   Sodium 139 135 - 145 mmol/L   Potassium 4.0 3.5 - 5.1 mmol/L   Chloride 100 (L) 101 - 111 mmol/L   CO2 32 22 - 32 mmol/L   Glucose, Bld 113 (H) 65 - 99 mg/dL   BUN 20 6 - 20 mg/dL   Creatinine, Ser 1.15 (H) 0.44 - 1.00 mg/dL   Calcium 8.5 (L) 8.9 - 10.3 mg/dL   Total Protein 6.9 6.5 - 8.1 g/dL   Albumin 3.4 (L) 3.5 - 5.0 g/dL   AST 25 15 - 41 U/L   ALT 13 (L) 14 - 54 U/L   Alkaline Phosphatase 81 38 - 126 U/L   Total Bilirubin 0.3 0.3 - 1.2 mg/dL   GFR calc non Af Amer 41 (L) >60 mL/min   GFR calc Af Amer 47 (L) >60 mL/min    Comment: (NOTE) The eGFR has been calculated using the CKD EPI equation. This calculation has not been validated in all clinical situations. eGFR's persistently <60 mL/min signify possible Chronic Kidney Disease.    Anion gap 7 5 - 15    Comment: Performed at [facility], [address] [place], [place] [address]  Troponin I  Status: None   Collection Time: 03/27/18  6:15 PM  Result Value Ref Range   Troponin I <0.03 <0.03 ng/mL    Comment: Performed at [facility], [address] [place], [place] [address]  Protime-INR     Status: None   Collection Time: 03/27/18  6:15 PM  Result Value Ref Range   Prothrombin Time 13.3 11.4 - 15.2 seconds   INR 1.02     Comment: Performed at [facility], [address] [place], [place] [address]  APTT     Status: None   Collection Time: 03/27/18  6:15 PM  Result Value Ref Range   aPTT 27 24 - 36 seconds    Comment: Performed at [facility], [address] [place], [place] [address]  Brain natriuretic peptide     Status: None   Collection Time: 03/27/18  8:21 PM  Result Value Ref Range   B Natriuretic Peptide 49.0 0.0 - 100.0 pg/mL    Comment: Performed at [facility], [address] [place], [place] [address]  Glucose, capillary      Status: None   Collection Time: 03/27/18  9:58 PM  Result Value Ref Range   Glucose-Capillary 91 65 - 99 mg/dL  Heparin level (unfractionated)     Status: Abnormal   Collection Time: 03/28/18  4:14 AM  Result Value Ref Range   Heparin Unfractionated 1.28 (H) 0.30 - 0.70 IU/mL    Comment:        IF HEPARIN RESULTS ARE BELOW EXPECTED VALUES, AND PATIENT DOSAGE HAS BEEN CONFIRMED, SUGGEST FOLLOW UP TESTING OF ANTITHROMBIN III LEVELS. Performed at [facility], [address] [place], [place] [address]   Basic metabolic panel     Status: Abnormal   Collection Time: 03/28/18  4:14 AM  Result Value Ref Range   Sodium 139 135 - 145 mmol/L   Potassium 3.2 (L) 3.5 - 5.1 mmol/L   Chloride 102 101 - 111 mmol/L   CO2 30 22 - 32 mmol/L   Glucose, Bld 104 (H) 65 - 99 mg/dL   BUN 19 6 - 20 mg/dL   Creatinine, Ser 1.04 (H) 0.44 - 1.00 mg/dL   Calcium 8.0 (L) 8.9 - 10.3 mg/dL   GFR calc non Af Amer 46 (L) >60 mL/min   GFR calc Af Amer 54 (L) >60 mL/min    Comment: (NOTE) The eGFR has been calculated using the CKD EPI equation. This calculation has not been validated in all clinical situations. eGFR's persistently <60 mL/min signify possible Chronic Kidney Disease.    Anion gap 7 5 - 15    Comment: Performed at [facility], [address] [place], [place] [address]  CBC     Status: Abnormal   Collection Time: 03/28/18  4:14 AM  Result Value Ref Range   WBC 6.8 3.6 - 11.0 K/uL   RBC 3.57 (L) 3.80 - 5.20 MIL/uL   Hemoglobin 11.3 (L) 12.0 - 16.0 g/dL   HCT 33.4 (L) 35.0 - 47.0 %   MCV 93.6 80.0 - 100.0 fL   MCH 31.7 26.0 - 34.0 pg   MCHC 33.8 32.0 - 36.0 g/dL   RDW 15.0 (H) 11.5 - 14.5 %   Platelets 178 150 - 440 K/uL    Comment: Performed at [facility], [address] [place], [place] [address]  Glucose, capillary     Status: Abnormal   Collection Time: 03/28/18  7:49 AM  Result Value Ref Range  Glucose-Capillary 101 (H) 65 - 99 mg/dL   Comment 1  Notify RN   Glucose, capillary     Status: None   Collection Time: 03/28/18 11:42 AM  Result Value Ref Range   Glucose-Capillary 90 65 - 99 mg/dL   Comment 1 Notify RN   Heparin level (unfractionated)     Status: Abnormal   Collection Time: 03/28/18  3:08 PM  Result Value Ref Range   Heparin Unfractionated 0.94 (H) 0.30 - 0.70 IU/mL    Comment:        IF HEPARIN RESULTS ARE BELOW EXPECTED VALUES, AND PATIENT DOSAGE HAS BEEN CONFIRMED, SUGGEST FOLLOW UP TESTING OF ANTITHROMBIN III LEVELS. Performed at [facility], [address] [place], [place] [address]   Glucose, capillary     Status: None   Collection Time: 03/28/18  4:55 PM  Result Value Ref Range   Glucose-Capillary 96 65 - 99 mg/dL   Comment 1 Notify RN   Glucose, capillary     Status: Abnormal   Collection Time: 03/28/18  9:20 PM  Result Value Ref Range   Glucose-Capillary 105 (H) 65 - 99 mg/dL  CBC     Status: Abnormal   Collection Time: 03/29/18  1:18 AM  Result Value Ref Range   WBC 6.4 3.6 - 11.0 K/uL   RBC 3.67 (L) 3.80 - 5.20 MIL/uL   Hemoglobin 11.5 (L) 12.0 - 16.0 g/dL   HCT 34.2 (L) 35.0 - 47.0 %   MCV 93.0 80.0 - 100.0 fL   MCH 31.4 26.0 - 34.0 pg   MCHC 33.8 32.0 - 36.0 g/dL   RDW 15.0 (H) 11.5 - 14.5 %   Platelets 187 150 - 440 K/uL    Comment: Performed at [facility], [address] [place], [place] [address]  Heparin level (unfractionated)     Status: Abnormal   Collection Time: 03/29/18  1:18 AM  Result Value Ref Range   Heparin Unfractionated 0.73 (H) 0.30 - 0.70 IU/mL    Comment:        IF HEPARIN RESULTS ARE BELOW EXPECTED VALUES, AND PATIENT DOSAGE HAS BEEN CONFIRMED, SUGGEST FOLLOW UP TESTING OF ANTITHROMBIN III LEVELS. Performed at [facility], [address], [place] [address]   Heparin level (unfractionated)     Status: Abnormal   Collection Time: 03/29/18  5:01 AM  Result Value Ref Range   Heparin Unfractionated 0.75 (H) 0.30 - 0.70  IU/mL    Comment:        IF HEPARIN RESULTS ARE BELOW EXPECTED VALUES, AND PATIENT DOSAGE HAS BEEN CONFIRMED, SUGGEST FOLLOW UP TESTING OF ANTITHROMBIN III LEVELS. Performed at [facility], [address] [place],  [address]   Glucose, capillary     Status: None   Collection Time: 03/29/18  7:33 AM  Result Value Ref Range   Glucose-Capillary 97 65 - 99 mg/dL  Glucose, capillary     Status: Abnormal   Collection Time: 03/29/18 11:30 AM  Result Value Ref Range   Glucose-Capillary 105 (H) 65 - 99 mg/dL  Glucose, capillary     Status: None   Collection Time: 03/29/18 11:58 AM  Result Value Ref Range   Glucose-Capillary 87 65 - 99 mg/dL  Glucose, capillary     Status: None   Collection Time: 03/29/18  2:24 PM  Result Value Ref Range   Glucose-Capillary 86 65 - 99 mg/dL  Glucose, capillary     Status: None   Collection Time: 03/29/18  4:38 PM  Result Value Ref Range   Glucose-Capillary 77 65 - 99 mg/dL  Glucose, capillary     Status: None   Collection Time: 03/29/18  9:22 PM  Result Value Ref Range   Glucose-Capillary 98 65 - 99 mg/dL  Heparin level (unfractionated)     Status: None   Collection Time: 03/30/18 12:09 AM  Result Value Ref Range   Heparin Unfractionated 0.43 0.30 - 0.70 IU/mL    Comment:        IF HEPARIN RESULTS ARE BELOW EXPECTED VALUES, AND PATIENT DOSAGE HAS BEEN CONFIRMED, SUGGEST FOLLOW UP TESTING OF ANTITHROMBIN III LEVELS. Performed at [facility], [address] [place], [place] [address]   Glucose, capillary     Status: None   Collection Time: 03/30/18  7:24 AM  Result Value Ref Range   Glucose-Capillary 96 65 - 99 mg/dL   Comment 1 Notify RN   CBC     Status: Abnormal   Collection Time: 03/30/18  7:45 AM  Result Value Ref Range   WBC 6.6 3.6 - 11.0 K/uL   RBC 3.27 (L) 3.80 - 5.20 MIL/uL   Hemoglobin 10.3 (L) 12.0 - 16.0 g/dL   HCT 30.7 (L) 35.0 - 47.0 %   MCV 94.0 80.0 - 100.0 fL   MCH 31.4 26.0 - 34.0 pg    MCHC 33.4 32.0 - 36.0 g/dL   RDW 15.0 (H) 11.5 - 14.5 %   Platelets 188 150 - 440 K/uL    Comment: Performed at [facility], [address] [place], [place] [address]  Basic metabolic panel     Status: Abnormal   Collection Time: 03/30/18  7:45 AM  Result Value Ref Range   Sodium 139 135 - 145 mmol/L   Potassium 3.9 3.5 - 5.1 mmol/L   Chloride 104 101 - 111 mmol/L   CO2 29 22 - 32 mmol/L   Glucose, Bld 106 (H) 65 - 99 mg/dL   BUN 16 6 - 20 mg/dL   Creatinine, Ser 1.28 (H) 0.44 - 1.00 mg/dL   Calcium 7.8 (L) 8.9 - 10.3 mg/dL   GFR calc non Af Amer 36 (L) >60 mL/min   GFR calc Af Amer 42 (L) >60 mL/min    Comment: (NOTE) The eGFR has been calculated using the CKD EPI equation. This calculation has not been validated in all clinical situations. eGFR's persistently <60 mL/min signify possible Chronic Kidney Disease.    Anion gap 6 5 - 15    Comment: Performed at [facility], [place], [place] [address]  Heparin level (unfractionated)     Status: None   Collection Time: 03/30/18  7:45 AM  Result Value Ref Range   Heparin Unfractionated 0.44 0.30 - 0.70 IU/mL    Comment:        IF HEPARIN RESULTS ARE BELOW EXPECTED VALUES, AND PATIENT DOSAGE HAS BEEN CONFIRMED, SUGGEST FOLLOW UP TESTING OF ANTITHROMBIN III LEVELS. Performed at [facility], [address] [place], [place] [address]   Glucose, capillary     Status: Abnormal   Collection Time: 03/30/18 11:40 AM  Result Value Ref Range   Glucose-Capillary 126 (H) 65 - 99 mg/dL  Glucose, capillary     Status: Abnormal   Collection Time: 03/30/18  4:46 PM  Result Value Ref Range   Glucose-Capillary 105 (H) 65 - 99 mg/dL   Comment 1 Notify RN   Glucose, capillary     Status: Abnormal  Collection Time: 03/30/18  9:23 PM  Result Value Ref Range   Glucose-Capillary 119 (H) 65 - 99 mg/dL   Comment 1 Notify RN   CBC     Status: Abnormal   Collection Time: 03/31/18  4:58 AM  Result  Value Ref Range   WBC 7.0 3.6 - 11.0 K/uL   RBC 3.10 (L) 3.80 - 5.20 MIL/uL   Hemoglobin 9.9 (L) 12.0 - 16.0 g/dL   HCT 29.3 (L) 35.0 - 47.0 %   MCV 94.6 80.0 - 100.0 fL   MCH 31.8 26.0 - 34.0 pg   MCHC 33.7 32.0 - 36.0 g/dL   RDW 14.9 (H) 11.5 - 14.5 %   Platelets 205 150 - 440 K/uL    Comment: Performed at [facility], [place], [place] [address]  Heparin level (unfractionated)     Status: None   Collection Time: 03/31/18  4:58 AM  Result Value Ref Range   Heparin Unfractionated 0.31 0.30 - 0.70 IU/mL    Comment:        IF HEPARIN RESULTS ARE BELOW EXPECTED VALUES, AND PATIENT DOSAGE HAS BEEN CONFIRMED, SUGGEST FOLLOW UP TESTING OF ANTITHROMBIN III LEVELS. Performed at [facility], [address] [place], [place] [address]   Protime-INR     Status: None   Collection Time: 03/31/18  4:58 AM  Result Value Ref Range   Prothrombin Time 13.7 11.4 - 15.2 seconds   INR 1.06     Comment: Performed at [facility], [address] [place], [place] [address]  Glucose, capillary     Status: Abnormal   Collection Time: 03/31/18  7:43 AM  Result Value Ref Range   Glucose-Capillary 107 (H) 65 - 99 mg/dL   Comment 1 Notify RN   Glucose, capillary     Status: Abnormal   Collection Time: 03/31/18 11:42 AM  Result Value Ref Range   Glucose-Capillary 130 (H) 65 - 99 mg/dL   Comment 1 Notify RN   Glucose, capillary     Status: Abnormal   Collection Time: 03/31/18  4:48 PM  Result Value Ref Range   Glucose-Capillary 125 (H) 65 - 99 mg/dL   Comment 1 Notify RN   Glucose, capillary     Status: Abnormal   Collection Time: 03/31/18 10:09 PM  Result Value Ref Range   Glucose-Capillary 121 (H) 65 - 99 mg/dL   Comment 1 Notify RN   Protime-INR     Status: None   Collection Time: 04/01/18  4:52 AM  Result Value Ref Range   Prothrombin Time 14.3 11.4 - 15.2 seconds   INR 1.12     Comment: Performed at [facility], [address] [place], [place] [address]  Glucose, capillary     Status: Abnormal   Collection Time: 04/01/18  7:50 AM  Result Value Ref Range   Glucose-Capillary 105 (H) 65 - 99 mg/dL   Comment 1 Notify RN   MRSA PCR Screening     Status: None   Collection Time: 04/01/18  8:09 AM  Result Value Ref Range   MRSA by PCR NEGATIVE NEGATIVE    Comment:        The GeneXpert MRSA Assay (FDA approved for NASAL specimens only), is one component of a comprehensive MRSA colonization surveillance program. It is not intended to diagnose MRSA infection nor to guide or monitor treatment for MRSA infections. Performed at [facility], [address], [place], [place] [address]  Glucose, capillary     Status: Abnormal   Collection Time: 04/01/18 11:39 AM  Result Value Ref Range   Glucose-Capillary 135 (H) 65 - 99 mg/dL   Comment 1 Notify RN   Glucose, capillary     Status: None   Collection Time: 04/01/18  5:02 PM  Result Value Ref Range   Glucose-Capillary 94 65 - 99 mg/dL  Comprehensive metabolic panel     Status: Abnormal   Collection Time: 04/03/18  9:49 PM  Result Value Ref Range   Sodium 137 135 - 145 mmol/L   Potassium 3.9 3.5 - 5.1 mmol/L   Chloride 100 (L) 101 - 111 mmol/L   CO2 29 22 - 32 mmol/L   Glucose, Bld 133 (H) 65 - 99 mg/dL   BUN 18 6 - 20 mg/dL   Creatinine, Ser 1.05 (H) 0.44 - 1.00 mg/dL   Calcium 7.9 (L) 8.9 - 10.3 mg/dL   Total Protein 5.8 (L) 6.5 - 8.1 g/dL   Albumin 2.6 (L) 3.5 - 5.0 g/dL   AST 36 15 - 41 U/L   ALT 19 14 - 54 U/L   Alkaline Phosphatase 69 38 - 126 U/L   Total Bilirubin 0.4 0.3 - 1.2 mg/dL   GFR calc non Af Amer 46 (L) >60 mL/min   GFR calc Af Amer 53 (L) >60 mL/min    Comment: (NOTE) The eGFR has been calculated using the CKD EPI equation. This calculation has not been validated in all clinical situations. eGFR's persistently <60 mL/min signify possible Chronic Kidney Disease.    Anion gap 8 5 - 15    Comment: Performed at [facility], [address] [place], [place] [address]  Lactic acid, plasma     Status: None   Collection Time: 04/03/18  9:49 PM  Result Value Ref Range   Lactic Acid, Venous 1.5 0.5 - 1.9 mmol/L    Comment: Performed at [facility], [address] [place], [place] [address]  CBC with Differential     Status: Abnormal   Collection Time: 04/03/18  9:49 PM  Result Value Ref Range   WBC 9.3 3.6 - 11.0 K/uL   RBC 3.28 (L) 3.80 - 5.20 MIL/uL   Hemoglobin 10.5 (L) 12.0 - 16.0 g/dL   HCT 30.9 (L) 35.0 - 47.0 %   MCV 94.4 80.0 - 100.0 fL   MCH 31.9 26.0 - 34.0 pg   MCHC 33.9 32.0 - 36.0 g/dL   RDW 15.5 (H) 11.5 - 14.5 %   Platelets 337 150 - 440 K/uL   Neutrophils Relative % 75 %   Neutro Abs 7.0 (H) 1.4 - 6.5 K/uL   Lymphocytes Relative 14 %   Lymphs Abs 1.3 1.0 - 3.6 K/uL   Monocytes Relative 10 %   Monocytes Absolute 0.9 0.2 - 0.9 K/uL   Eosinophils Relative 1 %   Eosinophils Absolute 0.1 0 - 0.7 K/uL   Basophils Relative 0 %   Basophils Absolute 0.0 0 - 0.1 K/uL    Comment: Performed at [facility], [address] [place], [place] [address]  Culture, blood (Routine x 2)     Status: None   Collection Time: 04/03/18  9:49 PM  Result Value Ref Range   Specimen Description BLOOD L AC    Special Requests      BOTTLES DRAWN AEROBIC AND ANAEROBIC Blood Culture adequate volume   Culture      NO GROWTH 5 DAYS Performed at [facility], [address], [place], [place] [address]    Report Status 04/08/2018 FINAL   Brain natriuretic peptide     Status: None   Collection Time: 04/03/18  9:49 PM  Result Value Ref Range   B Natriuretic Peptide 83.0 0.0 - 100.0 pg/mL    Comment: Performed at [facility], [address] [place], [place] [address]  Troponin I     Status: None   Collection Time: 04/03/18  9:49 PM  Result Value Ref Range   Troponin I <0.03 <0.03 ng/mL    Comment: Performed at [facility], [address] [place],  [place] [address]  TSH     Status: Abnormal   Collection Time: 04/03/18  9:49 PM  Result Value Ref Range   TSH 9.580 (H) 0.350 - 4.500 uIU/mL    Comment: Performed by a 3rd Generation assay with a functional sensitivity of <=0.01 uIU/mL. Performed at [facility], [address] [place], [place] [address]   Lactic acid, plasma     Status: None   Collection Time: 04/03/18 11:28 PM  Result Value Ref Range   Lactic Acid, Venous 0.9 0.5 - 1.9 mmol/L    Comment: Performed at [facility], [address] [place], [place] [address]  Protime-INR     Status: Abnormal   Collection Time: 04/03/18 11:28 PM  Result Value Ref Range   Prothrombin Time 18.9 (H) 11.4 - 15.2 seconds   INR 1.60     Comment: Performed at [facility], [address] [place], [place] [address]  Urinalysis, Complete w Microscopic     Status: Abnormal   Collection Time: 04/04/18  2:20 AM  Result Value Ref Range   Color, Urine YELLOW (A) YELLOW   APPearance TURBID (A) CLEAR   Specific Gravity, Urine >1.046 (H) 1.005 - 1.030   pH 6.0 5.0 - 8.0   Glucose, UA NEGATIVE NEGATIVE mg/dL   Hgb urine dipstick SMALL (A) NEGATIVE   Bilirubin Urine NEGATIVE NEGATIVE   Ketones, ur NEGATIVE NEGATIVE mg/dL   Protein, ur 30 (A) NEGATIVE mg/dL   Nitrite POSITIVE (A) NEGATIVE   Leukocytes, UA LARGE (A) NEGATIVE   RBC / HPF 11-20 0 - 5 RBC/hpf   WBC, UA >50 (H) 0 - 5 WBC/hpf   Bacteria, UA MANY (A) NONE SEEN   Squamous Epithelial / LPF 11-20 0 - 5    Comment: Please note change in reference range.   WBC Clumps PRESENT    Mucus PRESENT    Non Squamous Epithelial 0-5 (A) NONE SEEN    Comment: Performed at [facility], [address] [place], [place] [address]  MRSA PCR Screening     Status: None   Collection Time: 04/04/18  4:28 AM  Result Value Ref Range   MRSA by PCR NEGATIVE NEGATIVE    Comment:        The GeneXpert MRSA Assay (FDA approved for NASAL specimens only), is one component of  a comprehensive MRSA colonization surveillance program. It is not intended to diagnose MRSA infection nor to guide or monitor treatment for MRSA infections. Performed at [facility], [address] [place], [place] [address]   Glucose, capillary     Status: Abnormal   Collection Time: 04/04/18  4:29 AM  Result Value Ref Range   Glucose-Capillary 115 (H) 65 - 99 mg/dL  Procalcitonin - Baseline     Status: None   Collection Time: 04/04/18  5:35 AM  Result Value Ref Range   Procalcitonin <0.10 ng/mL    Comment:        Interpretation: PCT (Procalcitonin) <= 0.5 ng/mL: Systemic infection (sepsis) is not likely. Local bacterial infection is possible. (NOTE)       Sepsis PCT Algorithm           Lower Respiratory Tract                                      Infection PCT Algorithm    ----------------------------     ----------------------------         PCT < 0.25 ng/mL                PCT < 0.10 ng/mL         Strongly encourage             Strongly discourage   discontinuation of antibiotics    initiation of antibiotics    ----------------------------     -----------------------------       PCT 0.25 - 0.50 ng/mL            PCT 0.10 - 0.25 ng/mL               OR       >80% decrease in PCT            Discourage initiation of                                            antibiotics      Encourage discontinuation           of antibiotics    ----------------------------     -----------------------------         PCT >= 0.50 ng/mL              PCT 0.26 - 0.50 ng/mL               AND        <80% decrease in PCT             Encourage initiation of                                             antibiotics       Encourage continuation           of antibiotics    ----------------------------     -----------------------------        PCT >= 0.50 ng/mL                  PCT > 0.50 ng/mL               AND         increase in PCT                  Strongly encourage                                       initiation of antibiotics    Strongly encourage escalation  of antibiotics                                     -----------------------------                                           PCT <= 0.25 ng/mL                                                 OR                                        > 80% decrease in PCT                                     Discontinue / Do not initiate                                             antibiotics Performed at [facility], [address] [place], [place] [address]   Glucose, capillary     Status: None   Collection Time: 04/04/18  7:34 AM  Result Value Ref Range   Glucose-Capillary 84 65 - 99 mg/dL  Glucose, capillary     Status: Abnormal   Collection Time: 04/04/18 11:40 AM  Result Value Ref Range   Glucose-Capillary 127 (H) 65 - 99 mg/dL  Glucose, capillary     Status: Abnormal   Collection Time: 04/04/18  4:24 PM  Result Value Ref Range   Glucose-Capillary 112 (H) 65 - 99 mg/dL  Glucose, capillary     Status: Abnormal   Collection Time: 04/04/18  9:00 PM  Result Value Ref Range   Glucose-Capillary 107 (H) 65 - 99 mg/dL   Comment 1 Notify RN    Comment 2 Document in Chart   Procalcitonin     Status: None   Collection Time: 04/05/18  4:57 AM  Result Value Ref Range   Procalcitonin <0.10 ng/mL    Comment:        Interpretation: PCT (Procalcitonin) <= 0.5 ng/mL: Systemic infection (sepsis) is not likely. Local bacterial infection is possible. (NOTE)       Sepsis PCT Algorithm           Lower Respiratory Tract                                      Infection PCT Algorithm    ----------------------------     ----------------------------         PCT < 0.25 ng/mL                PCT < 0.10 ng/mL         Strongly encourage             Strongly discourage   discontinuation of antibiotics  initiation of antibiotics    ----------------------------     -----------------------------       PCT 0.25 - 0.50 ng/mL             PCT 0.10 - 0.25 ng/mL               OR       >80% decrease in PCT            Discourage initiation of                                            antibiotics      Encourage discontinuation           of antibiotics    ----------------------------     -----------------------------         PCT >= 0.50 ng/mL              PCT 0.26 - 0.50 ng/mL               AND        <80% decrease in PCT             Encourage initiation of                                             antibiotics       Encourage continuation           of antibiotics    ----------------------------     -----------------------------        PCT >= 0.50 ng/mL                  PCT > 0.50 ng/mL               AND         increase in PCT                  Strongly encourage                                      initiation of antibiotics    Strongly encourage escalation           of antibiotics                                     -----------------------------                                           PCT <= 0.25 ng/mL                                                 OR                                        >  80% decrease in PCT                                     Discontinue / Do not initiate                                             antibiotics Performed at [facility], [address] [place], [place] [address]   Glucose, capillary     Status: Abnormal   Collection Time: 04/05/18  7:30 AM  Result Value Ref Range   Glucose-Capillary 122 (H) 65 - 99 mg/dL  Glucose, capillary     Status: Abnormal   Collection Time: 04/05/18 11:51 AM  Result Value Ref Range   Glucose-Capillary 117 (H) 65 - 99 mg/dL  Urine Culture     Status: None   Collection Time: 04/05/18 12:48 PM  Result Value Ref Range   Specimen Description      URINE, RANDOM Performed at [facility], [address] [place], [place] [address]    Special Requests      Normal Performed at [facility], [address],  [place], [place] [address]    Culture      NO GROWTH Performed at [facility], [place] [address], [place],  [address]    Report Status 04/06/2018 FINAL   Glucose, capillary     Status: Abnormal   Collection Time: 04/05/18  4:57 PM  Result Value Ref Range   Glucose-Capillary 109 (H) 65 - 99 mg/dL   Comment 1 Notify RN   Glucose, capillary     Status: Abnormal   Collection Time: 04/05/18  9:16 PM  Result Value Ref Range   Glucose-Capillary 135 (H) 65 - 99 mg/dL  Procalcitonin     Status: None   Collection Time: 04/06/18  5:28 AM  Result Value Ref Range   Procalcitonin <0.10 ng/mL    Comment:        Interpretation: PCT (Procalcitonin) <= 0.5 ng/mL: Systemic infection (sepsis) is not likely. Local bacterial infection is possible. (NOTE)       Sepsis PCT Algorithm           Lower Respiratory Tract                                      Infection PCT Algorithm    ----------------------------     ----------------------------         PCT < 0.25 ng/mL                PCT < 0.10 ng/mL         Strongly encourage             Strongly discourage   discontinuation of antibiotics    initiation of antibiotics    ----------------------------     -----------------------------       PCT 0.25 - 0.50 ng/mL            PCT 0.10 - 0.25 ng/mL               OR       >80% decrease in PCT            Discourage initiation of  antibiotics      Encourage discontinuation           of antibiotics    ----------------------------     -----------------------------         PCT >= 0.50 ng/mL              PCT 0.26 - 0.50 ng/mL               AND        <80% decrease in PCT             Encourage initiation of                                             antibiotics       Encourage continuation           of antibiotics    ----------------------------     -----------------------------        PCT >= 0.50 ng/mL                  PCT > 0.50 ng/mL               AND          increase in PCT                  Strongly encourage                                      initiation of antibiotics    Strongly encourage escalation           of antibiotics                                     -----------------------------                                           PCT <= 0.25 ng/mL                                                 OR                                        > 80% decrease in PCT                                     Discontinue / Do not initiate                                             antibiotics Performed at [facility], [address] [place], [place] [address]   CBC     Status: Abnormal   Collection Time: 04/06/18  5:28  AM  Result Value Ref Range   WBC 6.1 3.6 - 11.0 K/uL   RBC 2.79 (L) 3.80 - 5.20 MIL/uL   Hemoglobin 9.4 (L) 12.0 - 16.0 g/dL   HCT 26.2 (L) 35.0 - 47.0 %   MCV 94.0 80.0 - 100.0 fL   MCH 33.5 26.0 - 34.0 pg   MCHC 35.7 32.0 - 36.0 g/dL   RDW 15.8 (H) 11.5 - 14.5 %   Platelets 331 150 - 440 K/uL    Comment: Performed at [facility], [address] [place], [place] [address]  Basic metabolic panel     Status: Abnormal   Collection Time: 04/06/18  5:28 AM  Result Value Ref Range   Sodium 140 135 - 145 mmol/L   Potassium 3.7 3.5 - 5.1 mmol/L   Chloride 106 101 - 111 mmol/L   CO2 28 22 - 32 mmol/L   Glucose, Bld 119 (H) 65 - 99 mg/dL   BUN 11 6 - 20 mg/dL   Creatinine, Ser 0.85 0.44 - 1.00 mg/dL   Calcium 7.4 (L) 8.9 - 10.3 mg/dL   GFR calc non Af Amer 59 (L) >60 mL/min   GFR calc Af Amer >60 >60 mL/min    Comment: (NOTE) The eGFR has been calculated using the CKD EPI equation. This calculation has not been validated in all clinical situations. eGFR's persistently <60 mL/min signify possible Chronic Kidney Disease.    Anion gap 6 5 - 15    Comment: Performed at [facility], [address] [place], [place] [address]  Protime-INR     Status: Abnormal   Collection Time: 04/06/18  5:28 AM   Result Value Ref Range   Prothrombin Time 16.3 (H) 11.4 - 15.2 seconds   INR 1.32     Comment: Performed at [facility], [address] [place], [place] [address]  Glucose, capillary     Status: Abnormal   Collection Time: 04/06/18  7:48 AM  Result Value Ref Range   Glucose-Capillary 115 (H) 65 - 99 mg/dL   Comment 1 Notify RN   Glucose, capillary     Status: Abnormal   Collection Time: 04/06/18 11:55 AM  Result Value Ref Range   Glucose-Capillary 109 (H) 65 - 99 mg/dL   Comment 1 Notify RN   Glucose, capillary     Status: Abnormal   Collection Time: 04/06/18  4:37 PM  Result Value Ref Range   Glucose-Capillary 129 (H) 65 - 99 mg/dL   Comment 1 Notify RN   Glucose, capillary     Status: Abnormal   Collection Time: 04/06/18  9:20 PM  Result Value Ref Range   Glucose-Capillary 123 (H) 65 - 99 mg/dL  Protime-INR     Status: Abnormal   Collection Time: 04/07/18  4:51 AM  Result Value Ref Range   Prothrombin Time 16.7 (H) 11.4 - 15.2 seconds   INR 1.36     Comment: Performed at [facility], [address] [place],  [address]  Glucose, capillary     Status: Abnormal   Collection Time: 04/07/18  7:49 AM  Result Value Ref Range   Glucose-Capillary 103 (H) 65 - 99 mg/dL   Comment 1 Notify RN   Glucose, capillary     Status: Abnormal   Collection Time: 04/07/18 12:02 PM  Result Value Ref Range   Glucose-Capillary 122 (H) 65 - 99 mg/dL   Comment 1 Notify RN   Glucose, capillary     Status:  Abnormal   Collection Time: 04/07/18  4:45 PM  Result Value Ref Range   Glucose-Capillary 118 (H) 65 - 99 mg/dL   Comment 1 Notify RN   Glucose, capillary     Status: None   Collection Time: 04/07/18  9:34 PM  Result Value Ref Range   Glucose-Capillary 90 65 - 99 mg/dL   Comment 1 Notify RN   Protime-INR     Status: Abnormal   Collection Time: 04/08/18  5:01 AM  Result Value Ref Range   Prothrombin Time 18.3 (H) 11.4 - 15.2 seconds   INR 1.53     Comment:  Performed at [facility], [address] [place], [place] [address]  Glucose, capillary     Status: Abnormal   Collection Time: 04/08/18  7:51 AM  Result Value Ref Range   Glucose-Capillary 111 (H) 65 - 99 mg/dL  Glucose, capillary     Status: None   Collection Time: 04/08/18 11:50 AM  Result Value Ref Range   Glucose-Capillary 95 65 - 99 mg/dL   Comment 1 Notify RN   Protime-INR     Status: Abnormal   Collection Time: 04/09/18 11:24 AM  Result Value Ref Range   INR 1.4 (H) 0.8 - 1.2    Comment: Reference interval is for non-anticoagulated patients. Suggested INR therapeutic range for Vitamin K antagonist therapy:    Standard Dose (moderate intensity                   therapeutic range):       2.0 - 3.0    Higher intensity therapeutic range       2.5 - 3.5    Prothrombin Time 14.4 (H) 9.1 - 12.0 sec  POCT HgB A1C     Status: None   Collection Time: 04/11/18 11:05 AM  Result Value Ref Range   Hemoglobin A1C 5.9   Vitamin B12     Status: None   Collection Time: 04/30/18  1:08 PM  Result Value Ref Range   Vitamin B-12 432 180 - 914 pg/mL    Comment: (NOTE) This assay is not validated for testing neonatal or myeloproliferative syndrome specimens for Vitamin B12 levels. Performed at [facility], [place] [address] [place], [place] [address]   Folate     Status: None   Collection Time: 04/30/18  1:08 PM  Result Value Ref Range   Folate 39.0 >5.9 ng/mL    Comment: Performed at [facility], [address] [place], [place] [address]  Ferritin     Status: None   Collection Time: 04/30/18  1:08 PM  Result Value Ref Range   Ferritin 21 11 - 307 ng/mL    Comment: Performed at [facility], [address] [place], [place] [address]  Iron and TIBC     Status: Abnormal   Collection Time: 04/30/18  1:08 PM  Result Value Ref Range   Iron 13 (L) 28 - 170 ug/dL   TIBC 189 (L) 250 - 450 ug/dL   Saturation Ratios 7 (L) 10.4 - 31.8 %   UIBC 176  ug/dL    Comment: Performed at [facility], [address] [place], [place] [address]  APTT     Status: Abnormal   Collection Time: 04/30/18  1:08 PM  Result Value Ref Range   aPTT 88 (H) 24 - 36 seconds    Comment:        IF BASELINE aPTT IS ELEVATED, SUGGEST PATIENT  RISK ASSESSMENT BE USED TO DETERMINE APPROPRIATE ANTICOAGULANT THERAPY. Performed at [facility], [address] [place], [place] [address]   Protime-INR     Status: Abnormal   Collection Time: 04/30/18  1:08 PM  Result Value Ref Range   Prothrombin Time 39.4 (H) 11.4 - 15.2 seconds   INR 4.09 (HH)     Comment: RESULT REPEATED AND VERIFIED CRITICAL RESULT CALLED TO, READ BACK BY AND VERIFIED WITH: COURTNEY GARRETT 04/30/18 [place] [facility], [address] [place], [place] [address]   Prothrombin gene mutation     Status: None   Collection Time: 04/30/18  1:08 PM  Result Value Ref Range   Recommendations-PTGENE: Comment     Comment: (NOTE) NEGATIVE No mutation identified. Comment: A point mutation (G20210A) in the factor II (prothrombin) gene is the second most common cause of inherited thrombophilia. The incidence of this mutation in the [place] Caucasian population is about 2% and in the [place] American population it is approximately 0.5%. This mutation is rare in the [place] and Native American population. Being heterozygous for a prothrombin mutation increases the risk for developing venous thrombosis about 2 to 3 times above the general population risk. Being homozygous for the prothrombin gene mutation increases the relative risk for venous thrombosis further, although it is not yet known how much further the risk is increased. In women heterozygous for the prothrombin gene mutation, the use of estrogen containing oral contraceptives increases the relative risk of venous thrombosis about 16 times and the risk of developing cerebral thrombosis is also  significantly increased. In pregnancy the pr othrombin gene mutation increases risk for venous thrombosis and may increase risk for stillbirth, placental abruption, pre-eclampsia and fetal growth restriction. If the patient possesses two or more congenital or acquired thrombophilic risk factors, the risk for thrombosis may rise to more than the sum of the risk ratios for the individual mutations. This assay detects only the prothrombin G20210A mutation and does not measure genetic abnormalities elsewhere in the genome. Other thrombotic risk factors may be pursued through systematic clinical laboratory analysis. These factors include the R506Q (Leiden) mutation in the Factor V gene, plasma homocysteine levels, as well as testing for deficiencies of antithrombin III, protein C and protein S. Genetic Counselors are available for health care providers to discuss results at 1-800-345-GENE [phone]). Methodology: DNA analysis of the Factor II gene was performed by PCR amplification followed by restriction analysis. The di agnostic sensitivity is >99% for both. All the tests must be combined with clinical information for the most accurate interpretation. Molecular-based testing is highly accurate, but as in any laboratory test, diagnostic errors may occur. This test was developed and its performance characteristics determined by [facility]. It has not been cleared or approved by the Food and Drug Administration. Poort SR, et al. Blood. 1996; 16:2446-9507. Varga EA. Circulation. 2004; 225:J50-N18. Mervin Hack, et Butner; 19:700-703. Allison Quarry, PhD, [facility] Ruben Reason, PhD, [facility] Annetta Maw, M.S., PhD, [facility] Alfredo Bach, PhD, [facility] Norva Riffle, PhD, [facility] Earlean Polka, PhD, [facility] Performed At: [facility] [address] [place], [place] [id number] Nechama Guard MD [id number] Performed at [facility], [address] [place], [place] [address]   Factor 5 leiden     Status: None   Collection Time: 04/30/18  1:08 PM  Result Value Ref Range   Recommendations-F5LEID: Comment     Comment: (NOTE) Result:  Negative (no mutation  found) Factor V Leiden is a specific mutation (R506Q) in the factor V gene that is associated with an increased risk of venous thrombosis. Factor V Leiden is more resistant to inactivation by activated protein C.  As a result, factor V persists in the circulation leading to a mild hyper- coagulable state.  The Leiden mutation accounts for 90% - 95% of APC resistance.  Factor V Leiden has been reported in patients with deep vein thrombosis, pulmonary embolus, central retinal vein occlusion, cerebral sinus thrombosis and hepatic vein thrombosis. Other risk factors to be considered in the workup for venous thrombosis include the G20210A mutation in the factor II (prothrombin) gene, protein S and C deficiency, and antithrombin deficiencies. Anticardiolipin antibody and lupus anticoagulant analysis may be appropriate for certain patients, as well as homocysteine levels. Contact your local [facility] for information on how to order additi onal testing if desired. **Genetic counselors are available for health care providers to**  discuss results at [phone]-GENE [phone]). Methodology: DNA analysis of the Factor V gene was performed by allele-specific PCR. The diagnostic sensitivity and specificity is >99% for both. Molecular-based testing is highly accurate, but as in any laboratory test, diagnostic errors may occur. All test results must be combined with clinical information for the most accurate interpretation. This test was developed and its performance characteristics determined by [facility]. It has not been cleared or approved by the Food and Drug Administration. References: Voelkerding K (1996).  Clin Lab Med [phone]. Allison Quarry, PhD, [facility] Ruben Reason, PhD,  [facility] Annetta Maw, M.S., PhD, [facility] Alfredo Bach, PhD, [facility] Norva Riffle, PhD, [facility] Earlean Polka PhD, [facility] Performed At: [facility] [place], [place] [id number] Nechama Guard MD [id number] Performed at [facility], [address] [place], [place] [address]   CBC with Differential/Platelet     Status: Abnormal   Collection Time: 04/30/18  1:08 PM  Result Value Ref Range   WBC 7.8 3.6 - 11.0 K/uL   RBC 3.77 (L) 3.80 - 5.20 MIL/uL   Hemoglobin 11.5 (L) 12.0 - 16.0 g/dL   HCT 34.6 (L) 35.0 - 47.0 %   MCV 91.8 80.0 - 100.0 fL   MCH 30.6 26.0 - 34.0 pg   MCHC 33.3 32.0 - 36.0 g/dL   RDW 15.8 (H) 11.5 - 14.5 %   Platelets 336 150 - 440 K/uL   Neutrophils Relative % 67 %   Neutro Abs 5.2 1.4 - 6.5 K/uL   Lymphocytes Relative 20 %   Lymphs Abs 1.6 1.0 - 3.6 K/uL   Monocytes Relative 11 %   Monocytes Absolute 0.9 0.2 - 0.9 K/uL   Eosinophils Relative 1 %   Eosinophils Absolute 0.1 0 - 0.7 K/uL   Basophils Relative 1 %   Basophils Absolute 0.0 0 - 0.1 K/uL    Comment: Performed at [facility], [address] [place], [place] [address]  INR/PT     Status: Abnormal   Collection Time: 05/02/18 10:52 AM  Result Value Ref Range   INR 2.6 (H) 0.8 - 1.0 ratio   Prothrombin Time 29.8 (H) 9.6 - 13.1 sec  Protime-INR     Status: Abnormal   Collection Time: 05/14/18  6:18 PM  Result Value Ref Range   Prothrombin Time 17.8 (H) 11.4 - 15.2 seconds   INR 1.48     Comment: Performed at [facility], [address], [place], [place] [address]  Protime-INR     Status: Abnormal  Collection Time: 05/15/18 11:16 AM  Result Value Ref Range   Prothrombin Time 16.6 (H) 11.4 - 15.2 seconds   INR 1.35     Comment: Performed at [facility], [address] [place], [place] [address]  Basic metabolic panel     Status: Abnormal   Collection Time: 05/15/18 11:16 AM  Result Value Ref Range   Sodium 139 135 - 145 mmol/L   Potassium  4.1 3.5 - 5.1 mmol/L   Chloride 103 101 - 111 mmol/L   CO2 26 22 - 32 mmol/L   Glucose, Bld 126 (H) 65 - 99 mg/dL   BUN 15 6 - 20 mg/dL   Creatinine, Ser 1.05 (H) 0.44 - 1.00 mg/dL   Calcium 8.7 (L) 8.9 - 10.3 mg/dL   GFR calc non Af Amer 45 (L) >60 mL/min   GFR calc Af Amer 53 (L) >60 mL/min    Comment: (NOTE) The eGFR has been calculated using the CKD EPI equation. This calculation has not been validated in all clinical situations. eGFR's persistently <60 mL/min signify possible Chronic Kidney Disease.    Anion gap 10 5 - 15    Comment: Performed at [facility], [address] [place], [place] [address]  CBC with Differential/Platelet     Status: Abnormal   Collection Time: 05/15/18 11:16 AM  Result Value Ref Range   WBC 5.7 3.6 - 11.0 K/uL   RBC 3.89 3.80 - 5.20 MIL/uL   Hemoglobin 11.6 (L) 12.0 - 16.0 g/dL   HCT 35.1 35.0 - 47.0 %   MCV 90.2 80.0 - 100.0 fL   MCH 29.7 26.0 - 34.0 pg   MCHC 32.9 32.0 - 36.0 g/dL   RDW 16.7 (H) 11.5 - 14.5 %   Platelets 307 150 - 440 K/uL   Neutrophils Relative % 66 %   Neutro Abs 3.8 1.4 - 6.5 K/uL   Lymphocytes Relative 22 %   Lymphs Abs 1.2 1.0 - 3.6 K/uL   Monocytes Relative 10 %   Monocytes Absolute 0.6 0.2 - 0.9 K/uL   Eosinophils Relative 1 %   Eosinophils Absolute 0.1 0 - 0.7 K/uL   Basophils Relative 1 %   Basophils Absolute 0.0 0 - 0.1 K/uL    Comment: Performed at [facility], [address] [place], [place] [address]   Objective  Body mass index is 28.85 kg/m. Wt Readings from Last 3 Encounters:  05/17/18 184 lb 3.2 oz (83.6 kg)  05/15/18 183 lb 12.8 oz (83.4 kg)  05/14/18 184 lb (83.5 kg)   Temp Readings from Last 3 Encounters:  05/17/18 98.3 F (36.8 C) (Oral)  05/15/18 97.7 F (36.5 C) (Tympanic)  05/02/18 99.1 F (37.3 C) (Oral)   BP Readings from Last 3 Encounters:  05/17/18 134/76  05/15/18 118/76  05/14/18 108/66   Pulse Readings from Last 3 Encounters:  05/17/18 87  05/15/18 74   05/14/18 94    Physical Exam  Constitutional: She is oriented to person, place, and time. Vital signs are normal. She appears well-developed and well-nourished. She is cooperative.  HENT:  Head: Normocephalic and atraumatic.  Mouth/Throat: Oropharynx is clear and moist and mucous membranes are normal.  Eyes: Pupils are equal, round, and reactive to light. Conjunctivae are normal.  Cardiovascular: Normal rate, regular rhythm and normal heart sounds.  Pulmonary/Chest: Effort normal and breath sounds normal.  Neurological: She is alert and oriented to person, place, and time.  Using rollator today   Skin: Skin  is warm, dry and intact.  Psychiatric: She has a normal mood and affect. Her speech is normal and behavior is normal. Judgment and thought content normal. Cognition and memory are normal.  Tearful on exam   Nursing note and vitals reviewed.   Assessment   1. Depression PHQ 9 score 20 today/insomnia  2. N/v, reduce appetite unknown etiology could be 2/2 #1  3. DVT 4. H/o pneumonia  5. DM 2 A1C 5.9 04/11/18  6. HM Plan  1. Pt is no longer on trazadone 25 mg qhs using melatonin 5 mg qhs  Will try remeron 15 mg qhs  F/u as sch  2. Prn zofran  3. F/u h/o  4. Repeat CT chest in 3 months  F/u pulm prn rec use flutter valve 3x per day 5. Cont metformin  6.  Fluhad pna 23 vx had 10/22/07this will need to be updated in futureat f/u  prevnar had 09/20/16 Tdap 08/04/16 shingrixnot had out of stock called pharmacy,? zostavaxif had  F/u dermatology has established to f/u   Out of age window pap, colonoscopy, mammo DEXA 01/01/08 osteopeniawill make sure on ca 600 bid and vit D 1000 iu qd Vitamin d 03/15/18 48.1   Provider: Dr. Olivia Mackie [facility]

## 2018-05-17 NOTE — Telephone Encounter (Signed)
Home Health called with INR result of 2.3. Asking for new orders

## 2018-05-17 NOTE — Telephone Encounter (Signed)
Per Dr Tasia Catchings 14mg  of coumadin, INR check on Monday 6/17.   Notified pt's son, [name]. Notified pt's case worker at [employer].

## 2018-05-17 NOTE — Progress Notes (Signed)
Pre visit review using our clinic review tool, if applicable. No additional management support is needed unless otherwise documented below in the visit note. 

## 2018-05-21 ENCOUNTER — Telehealth: Payer: Self-pay | Admitting: *Deleted

## 2018-05-21 NOTE — Telephone Encounter (Signed)
Per Dr Tasia Catchings, continue 14mg  coumadin, we will recheck here in the office tomorrow, home health to recheck on Friday.  Notified son, [name], and Baxter Flattery at [employer]

## 2018-05-21 NOTE — Telephone Encounter (Signed)
Jessica Erickson with [facility] called results of PT/INR 22.2 / 2.2. She is asking if there are new orders to recheck or change dosing. She is currently on 14 mg per day of warfarin

## 2018-05-22 ENCOUNTER — Inpatient Hospital Stay (HOSPITAL_BASED_OUTPATIENT_CLINIC_OR_DEPARTMENT_OTHER): Payer: Medicare Other | Admitting: Oncology

## 2018-05-22 ENCOUNTER — Inpatient Hospital Stay: Payer: Medicare Other

## 2018-05-22 ENCOUNTER — Encounter: Payer: Self-pay | Admitting: Oncology

## 2018-05-22 ENCOUNTER — Other Ambulatory Visit: Payer: Self-pay

## 2018-05-22 VITALS — BP 125/73 | HR 72 | Temp 98.4°F | Resp 18 | Wt 188.8 lb

## 2018-05-22 DIAGNOSIS — Z95828 Presence of other vascular implants and grafts: Secondary | ICD-10-CM | POA: Diagnosis not present

## 2018-05-22 DIAGNOSIS — Z7901 Long term (current) use of anticoagulants: Secondary | ICD-10-CM

## 2018-05-22 DIAGNOSIS — D649 Anemia, unspecified: Secondary | ICD-10-CM

## 2018-05-22 DIAGNOSIS — E860 Dehydration: Secondary | ICD-10-CM

## 2018-05-22 DIAGNOSIS — I824Y3 Acute embolism and thrombosis of unspecified deep veins of proximal lower extremity, bilateral: Secondary | ICD-10-CM | POA: Diagnosis not present

## 2018-05-22 DIAGNOSIS — E119 Type 2 diabetes mellitus without complications: Secondary | ICD-10-CM | POA: Diagnosis not present

## 2018-05-22 DIAGNOSIS — I10 Essential (primary) hypertension: Secondary | ICD-10-CM | POA: Diagnosis not present

## 2018-05-22 DIAGNOSIS — Z789 Other specified health status: Secondary | ICD-10-CM

## 2018-05-22 DIAGNOSIS — Z87891 Personal history of nicotine dependence: Secondary | ICD-10-CM

## 2018-05-22 LAB — CBC WITH DIFFERENTIAL/PLATELET
Basophils Absolute: 0 10*3/uL (ref 0–0.1)
Basophils Relative: 1 %
Eosinophils Absolute: 0.1 10*3/uL (ref 0–0.7)
Eosinophils Relative: 2 %
HCT: 33.6 % — ABNORMAL LOW (ref 35.0–47.0)
Hemoglobin: 11 g/dL — ABNORMAL LOW (ref 12.0–16.0)
Lymphocytes Relative: 25 %
Lymphs Abs: 1.2 10*3/uL (ref 1.0–3.6)
MCH: 29.4 pg (ref 26.0–34.0)
MCHC: 32.9 g/dL (ref 32.0–36.0)
MCV: 89.4 fL (ref 80.0–100.0)
Monocytes Absolute: 0.4 10*3/uL (ref 0.2–0.9)
Monocytes Relative: 9 %
Neutro Abs: 3 10*3/uL (ref 1.4–6.5)
Neutrophils Relative %: 63 %
Platelets: 298 10*3/uL (ref 150–440)
RBC: 3.76 MIL/uL — ABNORMAL LOW (ref 3.80–5.20)
RDW: 16.9 % — ABNORMAL HIGH (ref 11.5–14.5)
WBC: 4.8 10*3/uL (ref 3.6–11.0)

## 2018-05-22 LAB — BASIC METABOLIC PANEL
Anion gap: 9 (ref 5–15)
BUN: 12 mg/dL (ref 6–20)
CO2: 27 mmol/L (ref 22–32)
Calcium: 8.3 mg/dL — ABNORMAL LOW (ref 8.9–10.3)
Chloride: 102 mmol/L (ref 101–111)
Creatinine, Ser: 1 mg/dL (ref 0.44–1.00)
GFR calc Af Amer: 56 mL/min — ABNORMAL LOW (ref >60)
GFR calc non Af Amer: 48 mL/min — ABNORMAL LOW (ref >60)
Glucose, Bld: 113 mg/dL — ABNORMAL HIGH (ref 65–99)
Potassium: 3.8 mmol/L (ref 3.5–5.1)
Sodium: 138 mmol/L (ref 135–145)

## 2018-05-22 LAB — PROTIME-INR
INR: 1.46
Prothrombin Time: 17.6 seconds — ABNORMAL HIGH (ref 11.4–15.2)

## 2018-05-22 NOTE — Progress Notes (Signed)
Patient here today for follow up.   

## 2018-05-22 NOTE — Progress Notes (Signed)
Hematology/Oncology follow up note [facility] [phone] [phone]   Patient Care Team: McLean-Scocuzza, Nino Glow, MD as PCP - General (Internal Medicine)  REFERRING PROVIDER: McLean-Scocuzza, Nino Glow, MD CHIEF COMPLAINTS/PURPOSE OF CONSULTATION:  Evaluation of DVT and management of anticoagulation.   HISTORY OF PRESENTING ILLNESS:  Jessica Erickson is a  84 y.o.  female with PMH listed below who was referred to me for evaluation of DVT and chronic anticoagulation.  She has hard hearing and most history was obtained from her son and caregiver who accompanied her to clinic today.   Patient had a history of provoked right femoral and popliteal DVT and PE in 2016 after an episode of fall and back procedure. S/p IVC filter placed. She was on coumadin (19mg  daily per pcp) for anticoagulation for a period of time and eventually came off.  IVC filter was left in after a discussion between Dr.Dew and family, given her advanced age and multiple comorbidities.   Patient presented to ER for evaluation of a week of worsening LE swelling and pain.  03/29/2018 US showed extensive bilateral lower extremity DVT.  She was restarted on anticoagulation. Currently on Lovenox bridge to coumadin, per son with INR goal between 2.5-3.    04/04/2018 she had right upper extremity pain and swelling. US showed a subcutaneous soft tissue hematoma   INTERVAL HISTORY Jessica Erickson is a 84 y.o. female who has above history reviewed by me today presents for follow-up of management of anticoagulation.  Patient was accompanied by her son and caregiver. She recently had a pneumonia and was treated a course of Levaquin.  She also was appeared to be dehydrated when I saw her last week.  She was given IV fluids and antinausea medication.  Per son patient feels immediately better after the fluids. Currently patient feels much better compared to last week Cough almost resolved.   Appetite has increased.  No episode of fever or chills.  #Anticoagulation, her Coumadin dosing has been very challenging due to the limitation of home health care nurse obtaining a communication to [place] about.  She has required high-dose Coumadin due to the drug interaction with carbamazepine.  She has been instructed to take Coumadin 14 mg daily since last Friday except Monday, his son gave her 10 mg.   INR repeat yesterday was 2.2.  She was continued on 14 mg daily.  Denies any bleeding events. Chronic lower extremity swelling has been stable.    Review of Systems  Constitutional: Positive for malaise/fatigue. Negative for chills, fever and weight loss.  HENT: Positive for hearing loss. Negative for congestion, ear discharge, ear pain, nosebleeds, sinus pain and sore throat.   Eyes: Negative for double vision, photophobia, pain, discharge and redness.  Respiratory: Negative for cough, hemoptysis, sputum production, shortness of breath and wheezing.   Cardiovascular: Positive for leg swelling. Negative for chest pain, palpitations, orthopnea and claudication.  Gastrointestinal: Negative for abdominal pain, blood in stool, heartburn, melena, nausea and vomiting.  Genitourinary: Negative for dysuria, flank pain, frequency and hematuria.  Musculoskeletal: Negative for back pain, myalgias and neck pain.  Skin: Negative for itching and rash.  Neurological: Negative for dizziness, tingling, tremors, focal weakness and headaches.  Endo/Heme/Allergies: Negative for environmental allergies. Does not bruise/bleed easily.  Psychiatric/Behavioral: Negative for depression and hallucinations. The patient is not nervous/anxious.     MEDICAL HISTORY:  Past Medical History:  Diagnosis Date  . Acoustic neuroma ([place])   . Allergy   . Asthma   .  Bilateral swelling of feet    and legs  . Bladder infection   . CAD (coronary artery disease)   . Cataract   . Change in voice   . Compression fracture of body  of thoracic vertebra (HCC)    T12 09/18/15 MRI s/p fall   . Constipation   . COPD (chronic obstructive pulmonary disease) ([place])    previous CXR with chronic interstitial lung dz   . CVA (cerebral vascular accident) ([place])   . Depression   . Diabetes ([place])    with neuropathy  . Diabetes mellitus, type 2 ([place])   . Diarrhea   . Double vision   . DVT (deep venous thrombosis) ([place])    right leg 10/2015 was on coumadin off as of 2017/2018 ; s/p IVC filter  . Enuresis   . Eye pain, right   . Fall   . Fatty liver    09/15/15 also mildly dilated pancreatitic duct rec MRCP small sub cm cyst hemangioma speeln mild right hydronephrorossi and prox. hydroureter, kidney stones, mild scarring kidneys  . Female stress incontinence   . Flank pain   . GERD (gastroesophageal reflux disease)    with small hiatal hernia   . Hard of hearing   . Heart disease   . History of kidney problems   . Hyperlipidemia    mixed  . Hypertension   . Hypothyroidism, postsurgical   . Impaired mobility and ADLs    uses rolling walker has caretaker 24/7 at home  . Leg edema   . Mixed incontinence urge and stress (female)(female)   . Neuropathy   . Osteoarthritis    DDD spine   . Osteoporosis with fracture    T12 compression fracture  . Photophobia   . Pulmonary embolism ([place])    10/2015 off coumadin as of 04/2016  . Pulmonary HTN (HCC)    mild pulm HTN, echo 10/09/15 EF 94-70%JGGEZ 1 dd, RV systolic pressure increased   . Recurrent UTI   . Sinus pressure   . Skin cancer    BCC jawline and scalp   . Thyroid disease    follows [facility]  . TIA (transient ischemic attack)    MRI 2009/2010 neg stroke   . Trigeminal neuralgia    Dr. Tomi Bamberger s/p gamma knife x 2, on Tegretol since 2011/2012 no increase in dose >200 mg bid rec per family per neurology   . Urinary frequency   . Urinary, incontinence, stress female    Dr Erlene Quan urology     SURGICAL HISTORY: Past Surgical History:  Procedure Laterality  Date  . APPENDECTOMY     as a child, open  . BRAIN SURGERY     schwnnoma removal 1996   . brain tumor surgery    . BREAST SURGERY     breast bx  . CATARACT EXTRACTION    . CHOLECYSTECTOMY    . EYE SURGERY     cataract  . IVC FILTER PLACEMENT ([facility] HX)     Dr. Lucky Cowboy 10/2015   . LAPAROSCOPIC TUBAL LIGATION    . MOHS SURGERY     scalp 04/2014   . PERIPHERAL VASCULAR CATHETERIZATION N/A 10/11/2015   Procedure: IVC Filter Insertion;  Surgeon: Algernon Huxley, MD;  Location: [facility];  Service: Cardiovascular;  Laterality: N/A;  . PERIPHERAL VASCULAR THROMBECTOMY Bilateral 03/29/2018   Procedure: PERIPHERAL VASCULAR THROMBECTOMY;  Surgeon: Algernon Huxley, MD;  Location: [place] [facility];  Service: Cardiovascular;  Laterality: Bilateral;  .  PUBOVAGINAL SLING    . THROAT SURGERY    . THYROID SURGERY     tumor around vocal cords   . TOOTH EXTRACTION     winter 2018   . TOTAL THYROIDECTOMY  1976    SOCIAL HISTORY: Social History   Socioeconomic History  . Marital status: Widowed    Spouse name: Not on file  . Number of children: 5  . Years of education: Not on file  . Highest education level: Not on file  Occupational History  . Occupation: retired  Scientific laboratory technician  . Financial resource strain: Not on file  . Food insecurity:    Worry: Not on file    Inability: Not on file  . Transportation needs:    Medical: Not on file    Non-medical: Not on file  Tobacco Use  . Smoking status: Former Smoker    Packs/day: 0.50    Years: 20.00    Pack years: 10.00    Types: Cigarettes    Last attempt to quit: 09/20/1995    Years since quitting: 22.6  . Smokeless tobacco: Never Used  . Tobacco comment: quit 1996 smoked 20 years max 8 cig qd   Substance and Sexual Activity  . Alcohol use: No  . Drug use: No  . Sexual activity: Not on file  Lifestyle  . Physical activity:    Days per week: Not on file    Minutes per session: Not on file  . Stress: Not on file  Relationships    . Social connections:    Talks on phone: Not on file    Gets together: Not on file    Attends religious service: Not on file    Active member of club or organization: Not on file    Attends meetings of clubs or organizations: Not on file    Relationship status: Not on file  . Intimate partner violence:    Fear of current or ex partner: Not on file    Emotionally abused: Not on file    Physically abused: Not on file    Forced sexual activity: Not on file  Other Topics Concern  . Not on file  Social History Narrative   Lives at home with caretaker        FAMILY HISTORY: Family History  Problem Relation Age of Onset  . Heart disease Mother   . Diabetes Father   . Cancer Daughter        breast ca x 2 s/p mastectomy     ALLERGIES:  is allergic to penicillins; sulfa antibiotics; amitiza [lubiprostone]; aspirin; and penicillin g.  MEDICATIONS:  Current Outpatient Medications  Medication Sig Dispense Refill  . acetaminophen (TYLENOL) 325 MG tablet Take 650 mg by mouth every 6 (six) hours as needed.    Marland Kitchen albuterol (PROVENTIL) (2.5 MG/3ML) 0.083% nebulizer solution Take 2.5 mg by nebulization 3 (three) times daily.    . AMBULATORY NON FORMULARY MEDICATION Medication Name: Please dispense flutter valve to use three times daily after nebulization. DX:J47.9 1 each 0  . atorvastatin (LIPITOR) 40 MG tablet Take 1 tablet (40 mg total) by mouth daily at 6 PM. Generic ok 90 tablet 3  . bisacodyl (DULCOLAX) 5 MG EC tablet Take 1 tablet (5 mg total) by mouth daily as needed for moderate constipation. 30 tablet 5  . budesonide-formoterol (SYMBICORT) 160-4.5 MCG/ACT inhaler Inhale 2 puffs into the lungs 2 (two) times daily. Rinse mouth 1 Inhaler 11  . Calcium Carbonate-Vitamin D (CALCIUM 600+D)  600-400 MG-UNIT tablet Take 1 tablet by mouth 2 (two) times daily. Lunch and dinner 90 tablet 3  . carbamazepine (TEGRETOL) 200 MG tablet Take 1 tablet (200 mg total) by mouth 2 (two) times daily. 180  tablet 3  . cetirizine (ZYRTEC) 10 MG tablet Take 10 mg by mouth daily.    Marland Kitchen docusate sodium (COLACE) 100 MG capsule Take 1 capsule (100 mg total) by mouth 2 (two) times daily as needed for mild constipation. 180 capsule 3  . fluticasone (FLONASE) 50 MCG/ACT nasal spray Place 1-2 sprays into both nostrils daily. Max 2 sprays 16 g 11  . furosemide (LASIX) 40 MG tablet Take 1 tablet (40 mg total) by mouth daily. 30 tablet 2  . gabapentin (NEURONTIN) 100 MG capsule Take 1 capsule (100 mg total) by mouth 2 (two) times daily. 180 capsule 1  . guaiFENesin (MUCINEX) 600 MG 12 hr tablet Take 600 mg by mouth daily.     Marland Kitchen levothyroxine (SYNTHROID, LEVOTHROID) 200 MCG tablet Take 1 tablet (200 mcg total) by mouth daily before breakfast. Except on Sunday. Do not take with other medications or vitamins 90 tablet 1  . metFORMIN (GLUCOPHAGE-XR) 500 MG 24 hr tablet Take 1 tablet (500 mg total) by mouth daily with breakfast. 90 tablet 3  . mirtazapine (REMERON) 15 MG tablet Take 1 tablet (15 mg total) by mouth at bedtime. 30 tablet 2  . Multiple Vitamin (MULTIVITAMIN WITH MINERALS) TABS tablet Take 1 tablet by mouth daily.    . ondansetron (ZOFRAN) 4 MG tablet Take 1 tablet (4 mg total) by mouth every 8 (eight) hours as needed for nausea or vomiting. 30 tablet 1  . pantoprazole (PROTONIX) 40 MG tablet Take 1 tablet (40 mg total) by mouth daily. 30 minutes before lunch or dinner 90 tablet 3  . polyethylene glycol powder (GLYCOLAX/MIRALAX) powder Take 17 g by mouth daily as needed for mild constipation. 255 g 11  . polyvinyl alcohol (LIQUIFILM TEARS) 1.4 % ophthalmic solution Place 1 drop into both eyes as needed for dry eyes. 15 mL 11  . senna-docusate (SENOKOT-S) 8.6-50 MG tablet Take 1 tablet by mouth at bedtime as needed for mild constipation.    Marland Kitchen tiotropium (SPIRIVA) 18 MCG inhalation capsule Place 1 capsule (18 mcg total) into inhaler and inhale daily. 90 capsule 3  . warfarin (COUMADIN) 10 MG tablet Total 15  mg qpm (5mg + 10 mg) 90 tablet 0  . warfarin (COUMADIN) 5 MG tablet Take with 15mg  qd 90 tablet 0  . Melatonin 5 MG TABS Take by mouth.     No current facility-administered medications for this visit.      PHYSICAL EXAMINATION: ECOG PERFORMANCE STATUS: 3 - Symptomatic, >50% confined to bed Vitals:   05/22/18 1135 05/22/18 1140  BP: 125/73 125/73  Pulse: 72 72  Resp: 18 18  Temp: 98.4 F (36.9 C) 98.4 F (36.9 C)   Filed Weights   05/22/18 1135 05/22/18 1140  Weight: 188 lb 12.8 oz (85.6 kg) 188 lb 12.8 oz (85.6 kg)    Physical Exam  Constitutional: She is oriented to person, place, and time. She appears well-developed and well-nourished. No distress.  Sitting in wheel chair.   HENT:  Head: Normocephalic and atraumatic.  Right Ear: External ear normal.  Left Ear: External ear normal.  Mouth/Throat: Oropharynx is clear and moist.  Eyes: Pupils are equal, round, and reactive to light. Conjunctivae and EOM are normal. No scleral icterus.  pale  Neck: Normal range of motion.  Neck supple.  Cardiovascular: Normal rate, regular rhythm and normal heart sounds.  Pulmonary/Chest: Effort normal and breath sounds normal. No respiratory distress. She has no wheezes. She has no rales. She exhibits no tenderness.  Abdominal: Soft. Bowel sounds are normal. She exhibits no distension and no mass. There is no tenderness.  Musculoskeletal: Normal range of motion. She exhibits no edema or deformity.  Lymphadenopathy:    She has no cervical adenopathy.  Neurological: She is alert and oriented to person, place, and time. No cranial nerve deficit. Coordination normal.  Skin: Skin is warm and dry. No rash noted.  Psychiatric: She has a normal mood and affect. Her behavior is normal. Thought content normal.     LABORATORY DATA:  I have reviewed the data as listed Lab Results  Component Value Date   WBC 4.8 05/22/2018   HGB 11.0 (L) 05/22/2018   HCT 33.6 (L) 05/22/2018   MCV 89.4 05/22/2018    PLT 298 05/22/2018   Recent Labs    03/15/18 0528 03/27/18 1815  04/03/18 2149 04/06/18 0528 05/15/18 1116 05/22/18 1114  NA 137 139   < > 137 140 139 138  K 4.5 4.0   < > 3.9 3.7 4.1 3.8  CL 102 100*   < > 100* 106 103 102  CO2 27 32   < > 29 28 26 27   GLUCOSE 105* 113*   < > 133* 119* 126* 113*  BUN 14 20   < > 18 11 15 12   CREATININE 1.03* 1.15*   < > 1.05* 0.85 1.05* 1.00  CALCIUM 8.4* 8.5*   < > 7.9* 7.4* 8.7* 8.3*  GFRNONAA 47* 41*   < > 46* 59* 45* 48*  GFRAA 54* 47*   < > 53* >60 53* 56*  PROT 6.1* 6.9  --  5.8*  --   --   --   ALBUMIN 2.5* 3.4*  --  2.6*  --   --   --   AST 29 25  --  36  --   --   --   ALT 29 13*  --  19  --   --   --   ALKPHOS 78 81  --  69  --   --   --   BILITOT 0.4 0.3  --  0.4  --   --   --    < > = values in this interval not displayed.      ASSESSMENT & PLAN:  1. Acute deep vein thrombosis (DVT) of proximal vein of both lower extremities (HCC)   2. Drug interaction   3. Presence of IVC filter   #Acute extensive bilateral occlusive extremity DVT, throughout the popliteal vein. Given the thrombosis burden and symptoms, continue anticoagulation with Coumadin. DOACs not option due to drug interactions with carbamazepine. Today's INR is slightly subtherapeutic.  Patient's son self decreased Coumadin dose on Monday.   Recommend patient continue taking Coumadin 14 mg daily.  Repeat INR this Friday.  Continue INR 3 times a week until INR is stabilized. If continues to have difficulties managing patient INR, will switch to subcutaneous Lovenox 1.5 mg/kg daily.  05/10/2018 INR 1.1 advise patient to take 15 mg alternating with 10 mg every other day.(on Levaquin) 05/13/2018 INR supposed to be checked by [facility] RN, however, there was an error.  Patient was advised to take 10mg   05/14/2018 [facility] RN did not obtain INR until we called in late afternoon. Advised patient to take 10mg   as there has not been INR available. Later INR was check and it was 1.48.  6/12  INR 1.35, advise patient to take 15mg  daily, repeat INR on 6/14 for further dosing.  05/17/2018 INR 2.3. Instructed patient to take 14mg  daily.  05/20/2018: Home health RN did not come to check INR until very late. Son self decreased patients dose to 10mg  that day.  05/21/2018 INR 2.2, continued on 14mg  05/22/2018 INR 1.46, continue coumadin 14mg  daily.  # Anemia: Reports not tolerating oral iron supplementation.  Continue monitor.  If she becomes severely iron deficient, will offer IV iron.  Follow up to be determined.  We will continue Coumadin dosing with patient's INR.  Once patient is stabilized, she will follow-up with PCP for further management.  . Total face to face encounter time for this patient visit was25 min. >50% of the time was  spent in counseling and coordination of care. Earlie Server, MD, PhD Hematology Oncology [facility] Pager- [phone] 05/22/2018

## 2018-05-23 ENCOUNTER — Encounter: Payer: Self-pay | Admitting: Internal Medicine

## 2018-05-23 LAB — PROTIME-INR

## 2018-05-24 ENCOUNTER — Telehealth: Payer: Self-pay | Admitting: Internal Medicine

## 2018-05-24 NOTE — Telephone Encounter (Signed)
INR 1.6 05/23/18 home INR test  Please call patient and advise  Thanks [place]

## 2018-05-24 NOTE — Telephone Encounter (Signed)
Copied from [place] [id number]. Topic: General - Other >> May 24, 2018 12:03 PM Keene Breath wrote: Reason for CRM: Lonz called from [facility] with results - INR 1.6 yesterday 05/23/18.

## 2018-05-24 NOTE — Telephone Encounter (Signed)
Please advise patient to do coumadin 15mg  daily. Repeat INR on 05/27/2018

## 2018-05-24 NOTE — Telephone Encounter (Signed)
Notified pt's son, [name], and notified Caryl Pina at [place].

## 2018-05-24 NOTE — Telephone Encounter (Signed)
See second telephone encounter from 05/24/18

## 2018-05-27 ENCOUNTER — Telehealth: Payer: Self-pay | Admitting: *Deleted

## 2018-05-27 NOTE — Telephone Encounter (Signed)
After discussing with Dr Mike Gip, Recheck on wed as previously planned, keep dose at 15mg , Notified Caryl Pina at [place] and notified pt's son, [name].

## 2018-05-27 NOTE — Telephone Encounter (Signed)
PT 17.1 / INR 1.7 New orders? Current warfarin dose is 15 mg daily.

## 2018-05-27 NOTE — Telephone Encounter (Signed)
Per Dr Mike Gip, it is too soon to adjust Coumadin, it should be 7 days from last adjustment. Recheck INR on 05/31/18  Run stat and call results. Left message on [facility] voice mail

## 2018-05-29 ENCOUNTER — Telehealth: Payer: Self-pay | Admitting: Oncology

## 2018-05-29 NOTE — Telephone Encounter (Signed)
Received order from Honor Loh, NP for pt to continue 15mg  of Warfarin and recheck labs on Friday. Son, [name], and [facility] nurse, Kersey notified.

## 2018-05-29 NOTE — Telephone Encounter (Signed)
Received call from [place] [phone]) at [facility] with INR resutls 1.9. Message sent to on-call MD for dosage advise.

## 2018-05-31 ENCOUNTER — Telehealth: Payer: Self-pay

## 2018-05-31 ENCOUNTER — Telehealth: Payer: Self-pay | Admitting: Oncology

## 2018-05-31 NOTE — Telephone Encounter (Signed)
FYI

## 2018-05-31 NOTE — Telephone Encounter (Signed)
Copied from [place] [phone]. Topic: Quick Communication - See Telephone Encounter >> May 31, 2018 10:04 AM Hewitt Shorts wrote: [name] walker from [facility] is calling to let us know that her INR 2.0   Best number sheila [phone]

## 2018-05-31 NOTE — Telephone Encounter (Signed)
Per Epic notes Adela Lank, [facility] nurse, contacted PCP today with INR result of 2.0. Honor Loh, NP notified and advised for patient to continue on 15mg  warfarin and recheck on Monday.   Called [place] at [place], verbal order to recheck Monday was given. [name] (son) notified of update.

## 2018-06-03 ENCOUNTER — Telehealth: Payer: Self-pay | Admitting: Internal Medicine

## 2018-06-03 NOTE — Telephone Encounter (Signed)
FYI

## 2018-06-03 NOTE — Telephone Encounter (Signed)
Copied from [place] [phone]. Topic: General - Other >> Jun 03, 2018  9:49 AM Carolyn Stare wrote:  Adela Lank with [facility] call to report pt INR  2.5  [phone]

## 2018-06-04 ENCOUNTER — Ambulatory Visit (INDEPENDENT_AMBULATORY_CARE_PROVIDER_SITE_OTHER): Payer: Medicare Other | Admitting: Internal Medicine

## 2018-06-04 ENCOUNTER — Encounter: Payer: Self-pay | Admitting: Internal Medicine

## 2018-06-04 VITALS — BP 126/68 | HR 80 | Temp 97.6°F | Ht 67.0 in | Wt 190.6 lb

## 2018-06-04 DIAGNOSIS — R350 Frequency of micturition: Secondary | ICD-10-CM | POA: Diagnosis not present

## 2018-06-04 DIAGNOSIS — E039 Hypothyroidism, unspecified: Secondary | ICD-10-CM

## 2018-06-04 DIAGNOSIS — L570 Actinic keratosis: Secondary | ICD-10-CM | POA: Diagnosis not present

## 2018-06-04 DIAGNOSIS — R600 Localized edema: Secondary | ICD-10-CM | POA: Diagnosis not present

## 2018-06-04 DIAGNOSIS — G4700 Insomnia, unspecified: Secondary | ICD-10-CM

## 2018-06-04 DIAGNOSIS — J471 Bronchiectasis with (acute) exacerbation: Secondary | ICD-10-CM

## 2018-06-04 DIAGNOSIS — L989 Disorder of the skin and subcutaneous tissue, unspecified: Secondary | ICD-10-CM

## 2018-06-04 DIAGNOSIS — H9191 Unspecified hearing loss, right ear: Secondary | ICD-10-CM

## 2018-06-04 DIAGNOSIS — J479 Bronchiectasis, uncomplicated: Secondary | ICD-10-CM | POA: Insufficient documentation

## 2018-06-04 DIAGNOSIS — Z85828 Personal history of other malignant neoplasm of skin: Secondary | ICD-10-CM

## 2018-06-04 DIAGNOSIS — F329 Major depressive disorder, single episode, unspecified: Secondary | ICD-10-CM

## 2018-06-04 DIAGNOSIS — H9193 Unspecified hearing loss, bilateral: Secondary | ICD-10-CM | POA: Insufficient documentation

## 2018-06-04 DIAGNOSIS — E119 Type 2 diabetes mellitus without complications: Secondary | ICD-10-CM | POA: Diagnosis not present

## 2018-06-04 DIAGNOSIS — H9190 Unspecified hearing loss, unspecified ear: Secondary | ICD-10-CM | POA: Diagnosis not present

## 2018-06-04 DIAGNOSIS — J189 Pneumonia, unspecified organism: Secondary | ICD-10-CM

## 2018-06-04 DIAGNOSIS — F32A Depression, unspecified: Secondary | ICD-10-CM

## 2018-06-04 LAB — TSH: TSH: 1.49 u[IU]/mL (ref 0.35–4.50)

## 2018-06-04 MED ORDER — FUROSEMIDE 20 MG PO TABS: 20.0000 mg | ORAL_TABLET | Freq: Every day | ORAL | 0 refills | Status: DC

## 2018-06-04 NOTE — Progress Notes (Signed)
Pre visit review using our clinic review tool, if applicable. No additional management support is needed unless otherwise documented below in the visit note. 

## 2018-06-04 NOTE — Patient Instructions (Addendum)
Try melatonin 3-5 mg for sleep with Remeron at night  I will discontinue home nursing for coumadin due to having INR machine  We referred to [facility] and [facility]  Ear drops 4-7 days up to 2x per month  Try a humidifier or hot water in bowl with steam  Repeat CT chest 08/2018 f/u in 09/2018  2nd dose lasix with lunch continue 40 mg in am   Earwax Buildup, Adult The ears produce a substance called earwax that helps keep bacteria out of the ear and protects the skin in the ear canal. Occasionally, earwax can build up in the ear and cause discomfort or hearing loss. What increases the risk? This condition is more likely to develop in people who:  Are female.  Are elderly.  Naturally produce more earwax.  Clean their ears often with cotton swabs.  Use earplugs often.  Use in-ear headphones often.  Wear hearing aids.  Have narrow ear canals.  Have earwax that is overly thick or sticky.  Have eczema.  Are dehydrated.  Have excess hair in the ear canal.  What are the signs or symptoms? Symptoms of this condition include:  Reduced or muffled hearing.  A feeling of fullness in the ear or feeling that the ear is plugged.  Fluid coming from the ear.  Ear pain.  Ear itch.  Ringing in the ear.  Coughing.  An obvious piece of earwax that can be seen inside the ear canal.  How is this diagnosed? This condition may be diagnosed based on:  Your symptoms.  Your medical history.  An ear exam. During the exam, your health care provider will look into your ear with an instrument called an otoscope.  You may have tests, including a hearing test. How is this treated? This condition may be treated by:  Using ear drops to soften the earwax.  Having the earwax removed by a health care provider. The health care provider may: ? Flush the ear with water. ? Use an instrument that has a loop on the end (curette). ? Use a suction device.  Surgery to remove the  wax buildup. This may be done in severe cases.  Follow these instructions at home:  Take over-the-counter and prescription medicines only as told by your health care provider.  Do not put any objects, including cotton swabs, into your ear. You can clean the opening of your ear canal with a washcloth or facial tissue.  Follow instructions from your health care provider about cleaning your ears. Do not over-clean your ears.  Drink enough fluid to keep your urine clear or pale yellow. This will help to thin the earwax.  Keep all follow-up visits as told by your health care provider. If earwax builds up in your ears often or if you use hearing aids, consider seeing your health care provider for routine, preventive ear cleanings. Ask your health care provider how often you should schedule your cleanings.  If you have hearing aids, clean them according to instructions from the manufacturer and your health care provider. Contact a health care provider if:  You have ear pain.  You develop a fever.  You have blood, pus, or other fluid coming from your ear.  You have hearing loss.  You have ringing in your ears that does not go away.  Your symptoms do not improve with treatment.  You feel like the room is spinning (vertigo). Summary  Earwax can build up in the ear and cause discomfort or  hearing loss.  The most common symptoms of this condition include reduced or muffled hearing and a feeling of fullness in the ear or feeling that the ear is plugged.  This condition may be diagnosed based on your symptoms, your medical history, and an ear exam.  This condition may be treated by using ear drops to soften the earwax or by having the earwax removed by a health care provider.  Do not put any objects, including cotton swabs, into your ear. You can clean the opening of your ear canal with a washcloth or facial tissue. This information is not intended to replace advice given to you by your  health care provider. Make sure you discuss any questions you have with your health care provider. Document Released: 12/28/2004 Document Revised: 01/31/2017 Document Reviewed: 01/31/2017 Elsevier Interactive Patient Education  2018 [employer].   Debrox/Carbamide Peroxide ear solution-over the counter  What is this medicine? CARBAMIDE PEROXIDE (CAR bah mide per OX ide) is used to soften and help remove ear wax. This medicine may be used for other purposes; ask your health care provider or pharmacist if you have questions. COMMON BRAND NAME(S): Auro Ear, Auro Earache Relief, Debrox, Ear Drops, Ear Wax Removal, Ear Wax Remover, Earwax Treatment, Murine, Thera-Ear What should I tell my health care provider before I take this medicine? They need to know if you have any of these conditions: -dizziness -ear discharge -ear pain, irritation or rash -infection -perforated eardrum (hole in eardrum) -an unusual or allergic reaction to carbamide peroxide, glycerin, hydrogen peroxide, other medicines, foods, dyes, or preservatives -pregnant or trying to get pregnant -breast-feeding How should I use this medicine? This medicine is only for use in the outer ear canal. Follow the directions carefully. Wash hands before and after use. The solution may be warmed by holding the bottle in the hand for 1 to 2 minutes. Lie with the affected ear facing upward. Place the proper number of drops into the ear canal. After the drops are instilled, remain lying with the affected ear upward for 5 minutes to help the drops stay in the ear canal. A cotton ball may be gently inserted at the ear opening for no longer than 5 to 10 minutes to ensure retention. Repeat, if necessary, for the opposite ear. Do not touch the tip of the dropper to the ear, fingertips, or other surface. Do not rinse the dropper after use. Keep container tightly closed. Talk to your pediatrician regarding the use of this medicine in children. While  this drug may be used in children as young as 12 years for selected conditions, precautions do apply. Overdosage: If you think you have taken too much of this medicine contact a poison control center or emergency room at once. NOTE: This medicine is only for you. Do not share this medicine with others. What if I miss a dose? If you miss a dose, use it as soon as you can. If it is almost time for your next dose, use only that dose. Do not use double or extra doses. What may interact with this medicine? Interactions are not expected. Do not use any other ear products without asking your doctor or health care professional. This list may not describe all possible interactions. Give your health care provider a list of all the medicines, herbs, non-prescription drugs, or dietary supplements you use. Also tell them if you smoke, drink alcohol, or use illegal drugs. Some items may interact with your medicine. What should I watch for  while using this medicine? This medicine is not for long-term use. Do not use for more than 4 days without checking with your health care professional. Contact your doctor or health care professional if your condition does not start to get better within a few days or if you notice burning, redness, itching or swelling. What side effects may I notice from receiving this medicine? Side effects that you should report to your doctor or health care professional as soon as possible: -allergic reactions like skin rash, itching or hives, swelling of the face, lips, or tongue -burning, itching, and redness -worsening ear pain -rash Side effects that usually do not require medical attention (report to your doctor or health care professional if they continue or are bothersome): -abnormal sensation while putting the drops in the ear -temporary reduction in hearing (but not complete loss of hearing) This list may not describe all possible side effects. Call your doctor for medical advice  about side effects. You may report side effects to FDA at 1-800-FDA-1088. Where should I keep my medicine? Keep out of the reach of children. Store at room temperature between 15 and 30 degrees C (59 and 86 degrees F) in a tight, light-resistant container. Keep bottle away from excessive heat and direct sunlight. Throw away any unused medicine after the expiration date. NOTE: This sheet is a summary. It may not cover all possible information. If you have questions about this medicine, talk to your doctor, pharmacist, or health care provider.  2018 Elsevier/Gold Standard (2008-03-03 14:00:02)   Edema Edema is an abnormal buildup of fluids in your bodytissues. Edema is somewhatdependent on gravity to pull the fluid to the lowest place in your body. That makes the condition more common in the legs and thighs (lower extremities). Painless swelling of the feet and ankles is common and becomes more likely as you get older. It is also common in looser tissues, like around your eyes. When the affected area is squeezed, the fluid may move out of that spot and leave a dent for a few moments. This dent is called pitting. What are the causes? There are many possible causes of edema. Eating too much salt and being on your feet or sitting for a long time can cause edema in your legs and ankles. Hot weather may make edema worse. Common medical causes of edema include:  Heart failure.  Liver disease.  Kidney disease.  Weak blood vessels in your legs.  Cancer.  An injury.  Pregnancy.  Some medications.  Obesity.  What are the signs or symptoms? Edema is usually painless.Your skin may look swollen or shiny. How is this diagnosed? Your health care provider may be able to diagnose edema by asking about your medical history and doing a physical exam. You may need to have tests such as X-rays, an electrocardiogram, or blood tests to check for medical conditions that may cause edema. How is this  treated? Edema treatment depends on the cause. If you have heart, liver, or kidney disease, you need the treatment appropriate for these conditions. General treatment may include:  Elevation of the affected body part above the level of your heart.  Compression of the affected body part. Pressure from elastic bandages or support stockings squeezes the tissues and forces fluid back into the blood vessels. This keeps fluid from entering the tissues.  Restriction of fluid and salt intake.  Use of a water pill (diuretic). These medications are appropriate only for some types of edema. They pull fluid  out of your body and make you urinate more often. This gets rid of fluid and reduces swelling, but diuretics can have side effects. Only use diuretics as directed by your health care provider.  Follow these instructions at home:  Keep the affected body part above the level of your heart when you are lying down.  Do not sit still or stand for prolonged periods.  Do not put anything directly under your knees when lying down.  Do not wear constricting clothing or garters on your upper legs.  Exercise your legs to work the fluid back into your blood vessels. This may help the swelling go down.  Wear elastic bandages or support stockings to reduce ankle swelling as directed by your health care provider.  Eat a low-salt diet to reduce fluid if your health care provider recommends it.  Only take medicines as directed by your health care provider. Contact a health care provider if:  Your edema is not responding to treatment.  You have heart, liver, or kidney disease and notice symptoms of edema.  You have edema in your legs that does not improve after elevating them.  You have sudden and unexplained weight gain. Get help right away if:  You develop shortness of breath or chest pain.  You cannot breathe when you lie down.  You develop pain, redness, or warmth in the swollen areas.  You  have heart, liver, or kidney disease and suddenly get edema.  You have a fever and your symptoms suddenly get worse. This information is not intended to replace advice given to you by your health care provider. Make sure you discuss any questions you have with your health care provider. Document Released: 11/20/2005 Document Revised: 04/27/2016 Document Reviewed: 09/12/2013 Elsevier Interactive Patient Education  2017 [employer].

## 2018-06-04 NOTE — Telephone Encounter (Signed)
Per Dr Tasia Catchings,  Patient can continue 15mg  and is handing the management of patients INR over to PCP, Orland Mustard.

## 2018-06-04 NOTE — Telephone Encounter (Signed)
Inform pt continue 15 mg coumadin daily   TMS

## 2018-06-04 NOTE — Telephone Encounter (Signed)
[facility] nurse states that have been training the caregiver to do the INR ( H/H) she wants to know if you are sure that you want them to stop?

## 2018-06-04 NOTE — Telephone Encounter (Signed)
Patient has been notified

## 2018-06-04 NOTE — Telephone Encounter (Signed)
Patina please Call [facility] back pt had home INR machine cancel services H/H RN Dr. Tasia Catchings please note INR 2.5 06/03/18

## 2018-06-04 NOTE — Addendum Note (Signed)
Addended by: Leeanne Rio on: 06/04/2018 03:46 PM   Modules accepted: Orders

## 2018-06-04 NOTE — Progress Notes (Signed)
Chief Complaint  Patient presents with  . Follow-up   F/u with caretaker Kristen  1. Leg edema still present and gained 2 lbs no use of salt on lasix 40 mg qd  2. Depression and sleep overall improved on remeron 15 mg qhs she did report waking up at 1 am and could not fall asleep 1x since last visit PHQ score 4 today from last visit 20. Mood improved. Caretaker stopped melatonin 10 mg qhs due to both remeron and medication making too sleepy  3. INR 2.5 06/03/18 on coumadin for DVT b/l legs will forward to Dr. Tasia Catchings. She no longer thinks she needs H/H RN coming to check INR at home from [place]  4. DM 2 last A1C 04/11/18 5.9  5. Hypothyroidism on synthyroid 200 mcg qam 6/7 days per week last TSH >9 6. C/o reduced hearing with hearing aid in right ear only  7. C/o lesion skin new to right ear and recently draining and with dried blood there today  8. C/o urinary hesistancy and frequency with h/o UTI   Review of Systems  Constitutional: Negative for weight loss.  HENT: Positive for hearing loss.   Eyes: Negative for blurred vision.  Respiratory: Negative for shortness of breath.   Cardiovascular: Negative for chest pain.  Gastrointestinal: Negative for abdominal pain.  Genitourinary: Positive for frequency and urgency.       +hesistancy   Psychiatric/Behavioral: Negative for depression.       Mood improved    Past Medical History:  Diagnosis Date  . Acoustic neuroma ([place])   . Allergy   . Asthma   . Bilateral swelling of feet    and legs  . Bladder infection   . CAD (coronary artery disease)   . Cataract   . Change in voice   . Compression fracture of body of thoracic vertebra (HCC)    T12 09/18/15 MRI s/p fall   . Constipation   . COPD (chronic obstructive pulmonary disease) ([place])    previous CXR with chronic interstitial lung dz   . CVA (cerebral vascular accident) ([place])   . Depression   . Diabetes ([place])    with neuropathy  . Diabetes mellitus, type 2 ([place])   . Diarrhea   .  Double vision   . DVT (deep venous thrombosis) ([place])    right leg 10/2015 was on coumadin off as of 2017/2018 ; s/p IVC filter  . Enuresis   . Eye pain, right   . Fall   . Fatty liver    09/15/15 also mildly dilated pancreatitic duct rec MRCP small sub cm cyst hemangioma speeln mild right hydronephrorossi and prox. hydroureter, kidney stones, mild scarring kidneys  . Female stress incontinence   . Flank pain   . GERD (gastroesophageal reflux disease)    with small hiatal hernia   . Hard of hearing   . Heart disease   . History of kidney problems   . Hyperlipidemia    mixed  . Hypertension   . Hypothyroidism, postsurgical   . Impaired mobility and ADLs    uses rolling walker has caretaker 24/7 at home  . Leg edema   . Mixed incontinence urge and stress (female)(female)   . Neuropathy   . Osteoarthritis    DDD spine   . Osteoporosis with fracture    T12 compression fracture  . Photophobia   . Pulmonary embolism ([place])    10/2015 off coumadin as of 04/2016  . Pulmonary HTN ([place])  mild pulm HTN, echo 10/09/15 EF 38-45%XMIWO 1 dd, RV systolic pressure increased   . Recurrent UTI   . Sinus pressure   . Skin cancer    BCC jawline and scalp   . Thyroid disease    follows [facility]  . TIA (transient ischemic attack)    MRI 2009/2010 neg stroke   . Trigeminal neuralgia    Dr. Tomi Bamberger s/p gamma knife x 2, on Tegretol since 2011/2012 no increase in dose >200 mg bid rec per family per neurology   . Urinary frequency   . Urinary, incontinence, stress female    Dr Erlene Quan urology    Past Surgical History:  Procedure Laterality Date  . APPENDECTOMY     as a child, open  . BRAIN SURGERY     schwnnoma removal 1996   . brain tumor surgery    . BREAST SURGERY     breast bx  . CATARACT EXTRACTION    . CHOLECYSTECTOMY    . EYE SURGERY     cataract  . IVC FILTER PLACEMENT ([facility] HX)     Dr. Lucky Cowboy 10/2015   . LAPAROSCOPIC TUBAL LIGATION    . MOHS SURGERY     scalp 04/2014   .  PERIPHERAL VASCULAR CATHETERIZATION N/A 10/11/2015   Procedure: IVC Filter Insertion;  Surgeon: Algernon Huxley, MD;  Location: [facility];  Service: Cardiovascular;  Laterality: N/A;  . PERIPHERAL VASCULAR THROMBECTOMY Bilateral 03/29/2018   Procedure: PERIPHERAL VASCULAR THROMBECTOMY;  Surgeon: Algernon Huxley, MD;  Location: [facility];  Service: Cardiovascular;  Laterality: Bilateral;  . PUBOVAGINAL SLING    . THROAT SURGERY    . THYROID SURGERY     tumor around vocal cords   . TOOTH EXTRACTION     winter 2018   . TOTAL THYROIDECTOMY  1976   Family History  Problem Relation Age of Onset  . Heart disease Mother   . Diabetes Father   . Cancer Daughter        breast ca x 2 s/p mastectomy    Social History   Socioeconomic History  . Marital status: Widowed    Spouse name: Not on file  . Number of children: 5  . Years of education: Not on file  . Highest education level: Not on file  Occupational History  . Occupation: retired  Scientific laboratory technician  . Financial resource strain: Not on file  . Food insecurity:    Worry: Not on file    Inability: Not on file  . Transportation needs:    Medical: Not on file    Non-medical: Not on file  Tobacco Use  . Smoking status: Former Smoker    Packs/day: 0.50    Years: 20.00    Pack years: 10.00    Types: Cigarettes    Last attempt to quit: 09/20/1995    Years since quitting: 22.7  . Smokeless tobacco: Never Used  . Tobacco comment: quit 1996 smoked 20 years max 8 cig qd   Substance and Sexual Activity  . Alcohol use: No  . Drug use: No  . Sexual activity: Not on file  Lifestyle  . Physical activity:    Days per week: Not on file    Minutes per session: Not on file  . Stress: Not on file  Relationships  . Social connections:    Talks on phone: Not on file    Gets together: Not on file    Attends religious service: Not  on file    Active member of club or organization: Not on file    Attends meetings of clubs or  organizations: Not on file    Relationship status: Not on file  . Intimate partner violence:    Fear of current or ex partner: Not on file    Emotionally abused: Not on file    Physically abused: Not on file    Forced sexual activity: Not on file  Other Topics Concern  . Not on file  Social History Narrative   Lives at home with caretaker       Current Meds  Medication Sig  . acetaminophen (TYLENOL) 325 MG tablet Take 650 mg by mouth every 6 (six) hours as needed.  Marland Kitchen albuterol (PROVENTIL) (2.5 MG/3ML) 0.083% nebulizer solution Take 2.5 mg by nebulization 3 (three) times daily.  . AMBULATORY NON FORMULARY MEDICATION Medication Name: Please dispense flutter valve to use three times daily after nebulization. [id number]  . atorvastatin (LIPITOR) 40 MG tablet Take 1 tablet (40 mg total) by mouth daily at 6 PM. Generic ok  . bisacodyl (DULCOLAX) 5 MG EC tablet Take 1 tablet (5 mg total) by mouth daily as needed for moderate constipation.  . budesonide-formoterol (SYMBICORT) 160-4.5 MCG/ACT inhaler Inhale 2 puffs into the lungs 2 (two) times daily. Rinse mouth  . Calcium Carbonate-Vitamin D (CALCIUM 600+D) 600-400 MG-UNIT tablet Take 1 tablet by mouth 2 (two) times daily. Lunch and dinner  . carbamazepine (TEGRETOL) 200 MG tablet Take 1 tablet (200 mg total) by mouth 2 (two) times daily.  . cetirizine (ZYRTEC) 10 MG tablet Take 10 mg by mouth daily.  Marland Kitchen docusate sodium (COLACE) 100 MG capsule Take 1 capsule (100 mg total) by mouth 2 (two) times daily as needed for mild constipation.  . fluticasone (FLONASE) 50 MCG/ACT nasal spray Place 1-2 sprays into both nostrils daily. Max 2 sprays  . furosemide (LASIX) 40 MG tablet Take 1 tablet (40 mg total) by mouth daily.  Marland Kitchen gabapentin (NEURONTIN) 100 MG capsule Take 1 capsule (100 mg total) by mouth 2 (two) times daily.  Marland Kitchen guaiFENesin (MUCINEX) 600 MG 12 hr tablet Take 600 mg by mouth daily.   Marland Kitchen levothyroxine (SYNTHROID, LEVOTHROID) 200 MCG tablet Take 1  tablet (200 mcg total) by mouth daily before breakfast. Except on Sunday. Do not take with other medications or vitamins  . Melatonin 5 MG TABS Take by mouth.  . metFORMIN (GLUCOPHAGE-XR) 500 MG 24 hr tablet Take 1 tablet (500 mg total) by mouth daily with breakfast.  . mirtazapine (REMERON) 15 MG tablet Take 1 tablet (15 mg total) by mouth at bedtime.  . Multiple Vitamin (MULTIVITAMIN WITH MINERALS) TABS tablet Take 1 tablet by mouth daily.  . ondansetron (ZOFRAN) 4 MG tablet Take 1 tablet (4 mg total) by mouth every 8 (eight) hours as needed for nausea or vomiting.  . pantoprazole (PROTONIX) 40 MG tablet Take 1 tablet (40 mg total) by mouth daily. 30 minutes before lunch or dinner  . polyethylene glycol powder (GLYCOLAX/MIRALAX) powder Take 17 g by mouth daily as needed for mild constipation.  . polyvinyl alcohol (LIQUIFILM TEARS) 1.4 % ophthalmic solution Place 1 drop into both eyes as needed for dry eyes.  Marland Kitchen senna-docusate (SENOKOT-S) 8.6-50 MG tablet Take 1 tablet by mouth at bedtime as needed for mild constipation.  Marland Kitchen tiotropium (SPIRIVA) 18 MCG inhalation capsule Place 1 capsule (18 mcg total) into inhaler and inhale daily.  Marland Kitchen warfarin (COUMADIN) 10 MG tablet Total 15 mg  qpm (52m+ 10 mg)  . warfarin (COUMADIN) 5 MG tablet Take with 128mqd   Allergies  Allergen Reactions  . Penicillins Shortness Of Breath, Rash and Other (See Comments)    Has patient had a PCN reaction causing immediate rash, facial/tongue/throat swelling, SOB or lightheadedness with hypotension: Yes Has patient had a PCN reaction causing severe rash involving mucus membranes or skin necrosis: No Has patient had a PCN reaction that required hospitalization No Has patient had a PCN reaction occurring within the last 10 years: No If all of the above answers are "NO", then may proceed with Cephalosporin use.  . Sulfa Antibiotics Shortness Of Breath, Rash and Other (See Comments)  . Amitiza [Lubiprostone]     N/v/d  .  Aspirin Other (See Comments)    Reaction:  Unknown  Other reaction(s): Bleeding (intolerance) Per patient " causes nose to bleed" Can take 81 mg daily without any complications Other reaction(s): "bloody nose"   . Penicillin G Other (See Comments)    Has patient had a PCN reaction causing immediate rash, facial/tongue/throat swelling, SOB or lightheadedness with hypotension: No Has patient had a PCN reaction causing severe rash involving mucus membranes or skin necrosis: Unknown Has patient had a PCN reaction that required hospitalization: Unknown Has patient had a PCN reaction occurring within the last 10 years: Unknown If all of the above answers are "NO", then may proceed with Cephalosporin use.   Recent Results (from the past 2160 hour(s))  Glucose, capillary     Status: Abnormal   Collection Time: 03/06/18 11:49 AM  Result Value Ref Range   Glucose-Capillary 146 (H) 65 - 99 mg/dL   Comment 1 Notify RN   Glucose, capillary     Status: Abnormal   Collection Time: 03/06/18  4:40 PM  Result Value Ref Range   Glucose-Capillary 117 (H) 65 - 99 mg/dL   Comment 1 Notify RN   Glucose, capillary     Status: Abnormal   Collection Time: 03/06/18  8:32 PM  Result Value Ref Range   Glucose-Capillary 138 (H) 65 - 99 mg/dL  Glucose, capillary     Status: Abnormal   Collection Time: 03/07/18  8:07 AM  Result Value Ref Range   Glucose-Capillary 126 (H) 65 - 99 mg/dL   Comment 1 Notify RN   Glucose, capillary     Status: Abnormal   Collection Time: 03/07/18 11:38 AM  Result Value Ref Range   Glucose-Capillary 128 (H) 65 - 99 mg/dL   Comment 1 Notify RN   Glucose, capillary     Status: Abnormal   Collection Time: 03/07/18  5:14 PM  Result Value Ref Range   Glucose-Capillary 135 (H) 65 - 99 mg/dL   Comment 1 Notify RN   Glucose, capillary     Status: Abnormal   Collection Time: 03/07/18  9:25 PM  Result Value Ref Range   Glucose-Capillary 137 (H) 65 - 99 mg/dL   Comment 1 Notify RN     Comment 2 Document in Chart   Glucose, capillary     Status: Abnormal   Collection Time: 03/08/18  8:07 AM  Result Value Ref Range   Glucose-Capillary 128 (H) 65 - 99 mg/dL   Comment 1 Notify RN   Glucose, capillary     Status: Abnormal   Collection Time: 03/08/18 12:16 PM  Result Value Ref Range   Glucose-Capillary 137 (H) 65 - 99 mg/dL   Comment 1 Notify RN   CBC with Differential/Platelet  Status: Abnormal   Collection Time: 03/15/18  5:28 AM  Result Value Ref Range   WBC 8.2 3.6 - 11.0 K/uL   RBC 3.39 (L) 3.80 - 5.20 MIL/uL   Hemoglobin 10.8 (L) 12.0 - 16.0 g/dL   HCT 32.2 (L) 35.0 - 47.0 %   MCV 95.0 80.0 - 100.0 fL   MCH 31.9 26.0 - 34.0 pg   MCHC 33.6 32.0 - 36.0 g/dL   RDW 13.8 11.5 - 14.5 %   Platelets 225 150 - 440 K/uL   Neutrophils Relative % 70 %   Lymphocytes Relative 15 %   Monocytes Relative 11 %   Eosinophils Relative 3 %   Basophils Relative 1 %   Neutro Abs 5.8 1.4 - 6.5 K/uL   Lymphs Abs 1.2 1.0 - 3.6 K/uL   Monocytes Absolute 0.9 0.2 - 0.9 K/uL   Eosinophils Absolute 0.2 0 - 0.7 K/uL   Basophils Absolute 0.1 0 - 0.1 K/uL   RBC Morphology MIXED RBC POPULATION     Comment: POLYCHROMASIA PRESENT   WBC Morphology MILD LEFT SHIFT (1-5% METAS, OCC MYELO, OCC BANDS)    Smear Review      PLATELET CLUMPS NOTED ON SMEAR, COUNT APPEARS ADEQUATE    Comment: Performed at [facility], [address] [place], [place] [address]  Comprehensive metabolic panel     Status: Abnormal   Collection Time: 03/15/18  5:28 AM  Result Value Ref Range   Sodium 137 135 - 145 mmol/L   Potassium 4.5 3.5 - 5.1 mmol/L   Chloride 102 101 - 111 mmol/L   CO2 27 22 - 32 mmol/L   Glucose, Bld 105 (H) 65 - 99 mg/dL   BUN 14 6 - 20 mg/dL   Creatinine, Ser 1.03 (H) 0.44 - 1.00 mg/dL   Calcium 8.4 (L) 8.9 - 10.3 mg/dL   Total Protein 6.1 (L) 6.5 - 8.1 g/dL   Albumin 2.5 (L) 3.5 - 5.0 g/dL   AST 29 15 - 41 U/L   ALT 29 14 - 54 U/L   Alkaline Phosphatase 78 38 - 126  U/L   Total Bilirubin 0.4 0.3 - 1.2 mg/dL   GFR calc non Af Amer 47 (L) >60 mL/min   GFR calc Af Amer 54 (L) >60 mL/min    Comment: (NOTE) The eGFR has been calculated using the CKD EPI equation. This calculation has not been validated in all clinical situations. eGFR's persistently <60 mL/min signify possible Chronic Kidney Disease.    Anion gap 8 5 - 15    Comment: Performed at [facility], [address] [place], [place] [address]  Lipid panel     Status: None   Collection Time: 03/15/18  5:28 AM  Result Value Ref Range   Cholesterol 144 0 - 200 mg/dL   Triglycerides 83 <150 mg/dL   HDL 41 >40 mg/dL   Total CHOL/HDL Ratio 3.5 RATIO   VLDL 17 0 - 40 mg/dL   LDL Cholesterol 86 0 - 99 mg/dL    Comment:        Total Cholesterol/HDL:CHD Risk Coronary Heart Disease Risk Table                     Men   Women  1/2 Average Risk   3.4   3.3  Average Risk       5.0   4.4  2 X Average Risk   9.6   7.1  3 X Average  Risk  23.4   11.0        Use the calculated Patient Ratio above and the CHD Risk Table to determine the patient's CHD Risk.        ATP III CLASSIFICATION (LDL):  <100     mg/dL   Optimal  100-129  mg/dL   Near or Above                    Optimal  130-159  mg/dL   Borderline  160-189  mg/dL   High  >190     mg/dL   Very High Performed at [facility], [address] [place], [place] [address]   Magnesium     Status: None   Collection Time: 03/15/18  5:28 AM  Result Value Ref Range   Magnesium 1.8 1.7 - 2.4 mg/dL    Comment: Performed at [facility], [address] [place], [place] [address]  TSH     Status: None   Collection Time: 03/15/18  5:28 AM  Result Value Ref Range   TSH 3.333 0.350 - 4.500 uIU/mL    Comment: Performed by a 3rd Generation assay with a functional sensitivity of <=0.01 uIU/mL. Performed at [facility], [address] [place], [place] [address]   Vitamin B12     Status: None   Collection Time:  03/15/18  5:28 AM  Result Value Ref Range   Vitamin B-12 716 180 - 914 pg/mL    Comment: (NOTE) This assay is not validated for testing neonatal or myeloproliferative syndrome specimens for Vitamin B12 levels. Performed at [facility], [place] [address], [place], [place] [address]   VITAMIN D 25 Hydroxy (Vit-D Deficiency, Fractures)     Status: None   Collection Time: 03/15/18  5:28 AM  Result Value Ref Range   Vit D, 25-Hydroxy 48.1 30.0 - 100.0 ng/mL    Comment: (NOTE) Vitamin D deficiency has been defined by the [facility] practice guideline as a level of serum 25-OH vitamin D less than 20 ng/mL (1,2). The Endocrine Society went on to further define vitamin D insufficiency as a level between 21 and 29 ng/mL (2). 1. IOM (Institute of Medicine). 2010. Dietary reference   intakes for calcium and D. [place]: The   [employer]. 2. Holick MF, Binkley [place], Bischoff-Ferrari HA, et al.   Evaluation, treatment, and prevention of vitamin D   deficiency: an Endocrine Society clinical practice   guideline. JCEM. 2011 Jul; 96(7):1911-30. Performed At: [facility] [place], [place] [id number] Rush Farmer MD [id number] Performed at [facility], [address] [place], [place] [address]   CBC     Status: Abnormal   Collection Time: 03/27/18  6:15 PM  Result Value Ref Range   WBC 8.2 3.6 - 11.0 K/uL   RBC 3.80 3.80 - 5.20 MIL/uL   Hemoglobin 12.3 12.0 - 16.0 g/dL   HCT 35.8 35.0 - 47.0 %   MCV 94.2 80.0 - 100.0 fL   MCH 32.5 26.0 - 34.0 pg   MCHC 34.5 32.0 - 36.0 g/dL   RDW 15.1 (H) 11.5 - 14.5 %   Platelets 196 150 - 440 K/uL    Comment: Performed at [facility], [address] [place], [place] [address]  Comprehensive metabolic panel     Status: Abnormal   Collection Time: 03/27/18  6:15 PM  Result Value Ref Range  Sodium 139 135 - 145 mmol/L   Potassium 4.0 3.5 - 5.1  mmol/L   Chloride 100 (L) 101 - 111 mmol/L   CO2 32 22 - 32 mmol/L   Glucose, Bld 113 (H) 65 - 99 mg/dL   BUN 20 6 - 20 mg/dL   Creatinine, Ser 1.15 (H) 0.44 - 1.00 mg/dL   Calcium 8.5 (L) 8.9 - 10.3 mg/dL   Total Protein 6.9 6.5 - 8.1 g/dL   Albumin 3.4 (L) 3.5 - 5.0 g/dL   AST 25 15 - 41 U/L   ALT 13 (L) 14 - 54 U/L   Alkaline Phosphatase 81 38 - 126 U/L   Total Bilirubin 0.3 0.3 - 1.2 mg/dL   GFR calc non Af Amer 41 (L) >60 mL/min   GFR calc Af Amer 47 (L) >60 mL/min    Comment: (NOTE) The eGFR has been calculated using the CKD EPI equation. This calculation has not been validated in all clinical situations. eGFR's persistently <60 mL/min signify possible Chronic Kidney Disease.    Anion gap 7 5 - 15    Comment: Performed at [facility], [address] [place], [place] [address]  Troponin I     Status: None   Collection Time: 03/27/18  6:15 PM  Result Value Ref Range   Troponin I <0.03 <0.03 ng/mL    Comment: Performed at [facility], [address] [place], [place] [address]  Protime-INR     Status: None   Collection Time: 03/27/18  6:15 PM  Result Value Ref Range   Prothrombin Time 13.3 11.4 - 15.2 seconds   INR 1.02     Comment: Performed at [facility], [address] [place], [place] [address]  APTT     Status: None   Collection Time: 03/27/18  6:15 PM  Result Value Ref Range   aPTT 27 24 - 36 seconds    Comment: Performed at [facility], [address] [place], [place] [address]  Brain natriuretic peptide     Status: None   Collection Time: 03/27/18  8:21 PM  Result Value Ref Range   B Natriuretic Peptide 49.0 0.0 - 100.0 pg/mL    Comment: Performed at [facility], [address] [place], [place] [address]  Glucose, capillary     Status: None   Collection Time: 03/27/18  9:58 PM  Result Value Ref Range   Glucose-Capillary 91 65 - 99 mg/dL  Heparin level (unfractionated)     Status: Abnormal    Collection Time: 03/28/18  4:14 AM  Result Value Ref Range   Heparin Unfractionated 1.28 (H) 0.30 - 0.70 IU/mL    Comment:        IF HEPARIN RESULTS ARE BELOW EXPECTED VALUES, AND PATIENT DOSAGE HAS BEEN CONFIRMED, SUGGEST FOLLOW UP TESTING OF ANTITHROMBIN III LEVELS. Performed at [facility], [address] [place], [place] [address]   Basic metabolic panel     Status: Abnormal   Collection Time: 03/28/18  4:14 AM  Result Value Ref Range   Sodium 139 135 - 145 mmol/L   Potassium 3.2 (L) 3.5 - 5.1 mmol/L   Chloride 102 101 - 111 mmol/L   CO2 30 22 - 32 mmol/L   Glucose, Bld 104 (H) 65 - 99 mg/dL   BUN 19 6 - 20 mg/dL   Creatinine, Ser 1.04 (H) 0.44 - 1.00 mg/dL   Calcium 8.0 (L) 8.9 - 10.3 mg/dL   GFR calc non Af  Amer 46 (L) >60 mL/min   GFR calc Af Amer 54 (L) >60 mL/min    Comment: (NOTE) The eGFR has been calculated using the CKD EPI equation. This calculation has not been validated in all clinical situations. eGFR's persistently <60 mL/min signify possible Chronic Kidney Disease.    Anion gap 7 5 - 15    Comment: Performed at [facility], [address] [place], [place] [address]  CBC     Status: Abnormal   Collection Time: 03/28/18  4:14 AM  Result Value Ref Range   WBC 6.8 3.6 - 11.0 K/uL   RBC 3.57 (L) 3.80 - 5.20 MIL/uL   Hemoglobin 11.3 (L) 12.0 - 16.0 g/dL   HCT 33.4 (L) 35.0 - 47.0 %   MCV 93.6 80.0 - 100.0 fL   MCH 31.7 26.0 - 34.0 pg   MCHC 33.8 32.0 - 36.0 g/dL   RDW 15.0 (H) 11.5 - 14.5 %   Platelets 178 150 - 440 K/uL    Comment: Performed at [facility], [address] [place], [place] [address]  Glucose, capillary     Status: Abnormal   Collection Time: 03/28/18  7:49 AM  Result Value Ref Range   Glucose-Capillary 101 (H) 65 - 99 mg/dL   Comment 1 Notify RN   Glucose, capillary     Status: None   Collection Time: 03/28/18 11:42 AM  Result Value Ref Range   Glucose-Capillary 90 65 - 99 mg/dL   Comment 1 Notify  RN   Heparin level (unfractionated)     Status: Abnormal   Collection Time: 03/28/18  3:08 PM  Result Value Ref Range   Heparin Unfractionated 0.94 (H) 0.30 - 0.70 IU/mL    Comment:        IF HEPARIN RESULTS ARE BELOW EXPECTED VALUES, AND PATIENT DOSAGE HAS BEEN CONFIRMED, SUGGEST FOLLOW UP TESTING OF ANTITHROMBIN III LEVELS. Performed at [facility], [address] [place], [place] [address]   Glucose, capillary     Status: None   Collection Time: 03/28/18  4:55 PM  Result Value Ref Range   Glucose-Capillary 96 65 - 99 mg/dL   Comment 1 Notify RN   Glucose, capillary     Status: Abnormal   Collection Time: 03/28/18  9:20 PM  Result Value Ref Range   Glucose-Capillary 105 (H) 65 - 99 mg/dL  CBC     Status: Abnormal   Collection Time: 03/29/18  1:18 AM  Result Value Ref Range   WBC 6.4 3.6 - 11.0 K/uL   RBC 3.67 (L) 3.80 - 5.20 MIL/uL   Hemoglobin 11.5 (L) 12.0 - 16.0 g/dL   HCT 34.2 (L) 35.0 - 47.0 %   MCV 93.0 80.0 - 100.0 fL   MCH 31.4 26.0 - 34.0 pg   MCHC 33.8 32.0 - 36.0 g/dL   RDW 15.0 (H) 11.5 - 14.5 %   Platelets 187 150 - 440 K/uL    Comment: Performed at [facility], [address] [place], [place] [address]  Heparin level (unfractionated)     Status: Abnormal   Collection Time: 03/29/18  1:18 AM  Result Value Ref Range   Heparin Unfractionated 0.73 (H) 0.30 - 0.70 IU/mL    Comment:        IF HEPARIN RESULTS ARE BELOW EXPECTED VALUES, AND PATIENT DOSAGE HAS BEEN CONFIRMED, SUGGEST FOLLOW UP TESTING OF ANTITHROMBIN III LEVELS. Performed at [facility], [address], [place], [place] [address]  Heparin level (unfractionated)     Status: Abnormal   Collection Time: 03/29/18  5:01 AM  Result Value Ref Range   Heparin Unfractionated 0.75 (H) 0.30 - 0.70 IU/mL    Comment:        IF HEPARIN RESULTS ARE BELOW EXPECTED VALUES, AND PATIENT DOSAGE HAS BEEN CONFIRMED, SUGGEST FOLLOW UP TESTING OF ANTITHROMBIN III  LEVELS. Performed at [facility], [address] [place], [place] [address]   Glucose, capillary     Status: None   Collection Time: 03/29/18  7:33 AM  Result Value Ref Range   Glucose-Capillary 97 65 - 99 mg/dL  Glucose, capillary     Status: Abnormal   Collection Time: 03/29/18 11:30 AM  Result Value Ref Range   Glucose-Capillary 105 (H) 65 - 99 mg/dL  Glucose, capillary     Status: None   Collection Time: 03/29/18 11:58 AM  Result Value Ref Range   Glucose-Capillary 87 65 - 99 mg/dL  Glucose, capillary     Status: None   Collection Time: 03/29/18  2:24 PM  Result Value Ref Range   Glucose-Capillary 86 65 - 99 mg/dL  Glucose, capillary     Status: None   Collection Time: 03/29/18  4:38 PM  Result Value Ref Range   Glucose-Capillary 77 65 - 99 mg/dL  Glucose, capillary     Status: None   Collection Time: 03/29/18  9:22 PM  Result Value Ref Range   Glucose-Capillary 98 65 - 99 mg/dL  Heparin level (unfractionated)     Status: None   Collection Time: 03/30/18 12:09 AM  Result Value Ref Range   Heparin Unfractionated 0.43 0.30 - 0.70 IU/mL    Comment:        IF HEPARIN RESULTS ARE BELOW EXPECTED VALUES, AND PATIENT DOSAGE HAS BEEN CONFIRMED, SUGGEST FOLLOW UP TESTING OF ANTITHROMBIN III LEVELS. Performed at [facility], [address] [place], [place] [address]   Glucose, capillary     Status: None   Collection Time: 03/30/18  7:24 AM  Result Value Ref Range   Glucose-Capillary 96 65 - 99 mg/dL   Comment 1 Notify RN   CBC     Status: Abnormal   Collection Time: 03/30/18  7:45 AM  Result Value Ref Range   WBC 6.6 3.6 - 11.0 K/uL   RBC 3.27 (L) 3.80 - 5.20 MIL/uL   Hemoglobin 10.3 (L) 12.0 - 16.0 g/dL   HCT 30.7 (L) 35.0 - 47.0 %   MCV 94.0 80.0 - 100.0 fL   MCH 31.4 26.0 - 34.0 pg   MCHC 33.4 32.0 - 36.0 g/dL   RDW 15.0 (H) 11.5 - 14.5 %   Platelets 188 150 - 440 K/uL    Comment: Performed at [facility], [address]  [place], [place] [address]  Basic metabolic panel     Status: Abnormal   Collection Time: 03/30/18  7:45 AM  Result Value Ref Range   Sodium 139 135 - 145 mmol/L   Potassium 3.9 3.5 - 5.1 mmol/L   Chloride 104 101 - 111 mmol/L   CO2 29 22 - 32 mmol/L   Glucose, Bld 106 (H) 65 - 99 mg/dL   BUN 16 6 - 20 mg/dL   Creatinine, Ser 1.28 (H) 0.44 - 1.00 mg/dL   Calcium 7.8 (L) 8.9 - 10.3 mg/dL   GFR calc non Af Amer 36 (L) >60 mL/min   GFR calc Af Amer 42 (L) >60 mL/min  Comment: (NOTE) The eGFR has been calculated using the CKD EPI equation. This calculation has not been validated in all clinical situations. eGFR's persistently <60 mL/min signify possible Chronic Kidney Disease.    Anion gap 6 5 - 15    Comment: Performed at [facility], [place], [place] [address]  Heparin level (unfractionated)     Status: None   Collection Time: 03/30/18  7:45 AM  Result Value Ref Range   Heparin Unfractionated 0.44 0.30 - 0.70 IU/mL    Comment:        IF HEPARIN RESULTS ARE BELOW EXPECTED VALUES, AND PATIENT DOSAGE HAS BEEN CONFIRMED, SUGGEST FOLLOW UP TESTING OF ANTITHROMBIN III LEVELS. Performed at [facility], [address] [place], [place] [address]   Glucose, capillary     Status: Abnormal   Collection Time: 03/30/18 11:40 AM  Result Value Ref Range   Glucose-Capillary 126 (H) 65 - 99 mg/dL  Glucose, capillary     Status: Abnormal   Collection Time: 03/30/18  4:46 PM  Result Value Ref Range   Glucose-Capillary 105 (H) 65 - 99 mg/dL   Comment 1 Notify RN   Glucose, capillary     Status: Abnormal   Collection Time: 03/30/18  9:23 PM  Result Value Ref Range   Glucose-Capillary 119 (H) 65 - 99 mg/dL   Comment 1 Notify RN   CBC     Status: Abnormal   Collection Time: 03/31/18  4:58 AM  Result Value Ref Range   WBC 7.0 3.6 - 11.0 K/uL   RBC 3.10 (L) 3.80 - 5.20 MIL/uL   Hemoglobin 9.9 (L) 12.0 - 16.0 g/dL   HCT 29.3 (L) 35.0 - 47.0 %   MCV 94.6  80.0 - 100.0 fL   MCH 31.8 26.0 - 34.0 pg   MCHC 33.7 32.0 - 36.0 g/dL   RDW 14.9 (H) 11.5 - 14.5 %   Platelets 205 150 - 440 K/uL    Comment: Performed at [facility], [address] [place], [place] [address]  Heparin level (unfractionated)     Status: None   Collection Time: 03/31/18  4:58 AM  Result Value Ref Range   Heparin Unfractionated 0.31 0.30 - 0.70 IU/mL    Comment:        IF HEPARIN RESULTS ARE BELOW EXPECTED VALUES, AND PATIENT DOSAGE HAS BEEN CONFIRMED, SUGGEST FOLLOW UP TESTING OF ANTITHROMBIN III LEVELS. Performed at [facility], [address] [place], [place] [address]   Protime-INR     Status: None   Collection Time: 03/31/18  4:58 AM  Result Value Ref Range   Prothrombin Time 13.7 11.4 - 15.2 seconds   INR 1.06     Comment: Performed at [facility], [address] [place], [place] [address]  Glucose, capillary     Status: Abnormal   Collection Time: 03/31/18  7:43 AM  Result Value Ref Range   Glucose-Capillary 107 (H) 65 - 99 mg/dL   Comment 1 Notify RN   Glucose, capillary     Status: Abnormal   Collection Time: 03/31/18 11:42 AM  Result Value Ref Range   Glucose-Capillary 130 (H) 65 - 99 mg/dL   Comment 1 Notify RN   Glucose, capillary     Status: Abnormal   Collection Time: 03/31/18  4:48 PM  Result Value Ref Range   Glucose-Capillary 125 (H) 65 - 99 mg/dL   Comment 1 Notify RN   Glucose, capillary  Status: Abnormal   Collection Time: 03/31/18 10:09 PM  Result Value Ref Range   Glucose-Capillary 121 (H) 65 - 99 mg/dL   Comment 1 Notify RN   Protime-INR     Status: None   Collection Time: 04/01/18  4:52 AM  Result Value Ref Range   Prothrombin Time 14.3 11.4 - 15.2 seconds   INR 1.12     Comment: Performed at [facility], [address] [place], [place] [address]  Glucose, capillary     Status: Abnormal   Collection Time: 04/01/18  7:50 AM  Result Value Ref Range   Glucose-Capillary 105 (H)  65 - 99 mg/dL   Comment 1 Notify RN   MRSA PCR Screening     Status: None   Collection Time: 04/01/18  8:09 AM  Result Value Ref Range   MRSA by PCR NEGATIVE NEGATIVE    Comment:        The GeneXpert MRSA Assay (FDA approved for NASAL specimens only), is one component of a comprehensive MRSA colonization surveillance program. It is not intended to diagnose MRSA infection nor to guide or monitor treatment for MRSA infections. Performed at [facility], [address] [place], [place] [address]   Glucose, capillary     Status: Abnormal   Collection Time: 04/01/18 11:39 AM  Result Value Ref Range   Glucose-Capillary 135 (H) 65 - 99 mg/dL   Comment 1 Notify RN   Glucose, capillary     Status: None   Collection Time: 04/01/18  5:02 PM  Result Value Ref Range   Glucose-Capillary 94 65 - 99 mg/dL  Protime-INR     Status: Abnormal   Collection Time: 04/02/18 12:00 AM  Result Value Ref Range   INR 1.3 (A) 0.9 - 1.1  Comprehensive metabolic panel     Status: Abnormal   Collection Time: 04/03/18  9:49 PM  Result Value Ref Range   Sodium 137 135 - 145 mmol/L   Potassium 3.9 3.5 - 5.1 mmol/L   Chloride 100 (L) 101 - 111 mmol/L   CO2 29 22 - 32 mmol/L   Glucose, Bld 133 (H) 65 - 99 mg/dL   BUN 18 6 - 20 mg/dL   Creatinine, Ser 1.05 (H) 0.44 - 1.00 mg/dL   Calcium 7.9 (L) 8.9 - 10.3 mg/dL   Total Protein 5.8 (L) 6.5 - 8.1 g/dL   Albumin 2.6 (L) 3.5 - 5.0 g/dL   AST 36 15 - 41 U/L   ALT 19 14 - 54 U/L   Alkaline Phosphatase 69 38 - 126 U/L   Total Bilirubin 0.4 0.3 - 1.2 mg/dL   GFR calc non Af Amer 46 (L) >60 mL/min   GFR calc Af Amer 53 (L) >60 mL/min    Comment: (NOTE) The eGFR has been calculated using the CKD EPI equation. This calculation has not been validated in all clinical situations. eGFR's persistently <60 mL/min signify possible Chronic Kidney Disease.    Anion gap 8 5 - 15    Comment: Performed at [facility], [place], [place] [address]  Lactic acid, plasma     Status: None   Collection Time: 04/03/18  9:49 PM  Result Value Ref Range   Lactic Acid, Venous 1.5 0.5 - 1.9 mmol/L    Comment: Performed at [facility], [address] [place], [place] [address]  CBC with Differential     Status: Abnormal   Collection Time: 04/03/18  9:49 PM  Result Value Ref Range   WBC 9.3 3.6 - 11.0 K/uL   RBC 3.28 (L) 3.80 - 5.20 MIL/uL   Hemoglobin 10.5 (L) 12.0 - 16.0 g/dL   HCT 30.9 (L) 35.0 - 47.0 %   MCV 94.4 80.0 - 100.0 fL   MCH 31.9 26.0 - 34.0 pg   MCHC 33.9 32.0 - 36.0 g/dL   RDW 15.5 (H) 11.5 - 14.5 %   Platelets 337 150 - 440 K/uL   Neutrophils Relative % 75 %   Neutro Abs 7.0 (H) 1.4 - 6.5 K/uL   Lymphocytes Relative 14 %   Lymphs Abs 1.3 1.0 - 3.6 K/uL   Monocytes Relative 10 %   Monocytes Absolute 0.9 0.2 - 0.9 K/uL   Eosinophils Relative 1 %   Eosinophils Absolute 0.1 0 - 0.7 K/uL   Basophils Relative 0 %   Basophils Absolute 0.0 0 - 0.1 K/uL    Comment: Performed at [facility], [address] [place], [place] [address]  Culture, blood (Routine x 2)     Status: None   Collection Time: 04/03/18  9:49 PM  Result Value Ref Range   Specimen Description BLOOD L AC    Special Requests      BOTTLES DRAWN AEROBIC AND ANAEROBIC Blood Culture adequate volume   Culture      NO GROWTH 5 DAYS Performed at [facility], [address] [place], [place] [address]    Report Status 04/08/2018 FINAL   Brain natriuretic peptide     Status: None   Collection Time: 04/03/18  9:49 PM  Result Value Ref Range   B Natriuretic Peptide 83.0 0.0 - 100.0 pg/mL    Comment: Performed at [facility], [address] [place], [place] [address]  Troponin I     Status: None   Collection Time: 04/03/18  9:49 PM  Result Value Ref Range   Troponin I <0.03 <0.03 ng/mL    Comment: Performed at [facility], [address] [place], [place] [address]  TSH     Status:  Abnormal   Collection Time: 04/03/18  9:49 PM  Result Value Ref Range   TSH 9.580 (H) 0.350 - 4.500 uIU/mL    Comment: Performed by a 3rd Generation assay with a functional sensitivity of <=0.01 uIU/mL. Performed at [facility], [address] [place], [place] [address]   Lactic acid, plasma     Status: None   Collection Time: 04/03/18 11:28 PM  Result Value Ref Range   Lactic Acid, Venous 0.9 0.5 - 1.9 mmol/L    Comment: Performed at [facility], [address] [place], [place] [address]  Protime-INR     Status: Abnormal   Collection Time: 04/03/18 11:28 PM  Result Value Ref Range   Prothrombin Time 18.9 (H) 11.4 - 15.2 seconds   INR 1.60     Comment: Performed at [facility], [address] [place], [place] [address]  Urinalysis, Complete w Microscopic     Status: Abnormal   Collection Time: 04/04/18  2:20 AM  Result Value Ref Range   Color, Urine YELLOW (A) YELLOW   APPearance TURBID (A) CLEAR   Specific Gravity, Urine >1.046 (H) 1.005 - 1.030   pH 6.0 5.0 - 8.0   Glucose, UA NEGATIVE NEGATIVE mg/dL   Hgb urine dipstick SMALL (A) NEGATIVE   Bilirubin Urine NEGATIVE NEGATIVE   Ketones, ur NEGATIVE NEGATIVE mg/dL   Protein, ur 30 (A) NEGATIVE mg/dL  Nitrite POSITIVE (A) NEGATIVE   Leukocytes, UA LARGE (A) NEGATIVE   RBC / HPF 11-20 0 - 5 RBC/hpf   WBC, UA >50 (H) 0 - 5 WBC/hpf   Bacteria, UA MANY (A) NONE SEEN   Squamous Epithelial / LPF 11-20 0 - 5    Comment: Please note change in reference range.   WBC Clumps PRESENT    Mucus PRESENT    Non Squamous Epithelial 0-5 (A) NONE SEEN    Comment: Performed at [facility], [address] [place], [place] [address]  MRSA PCR Screening     Status: None   Collection Time: 04/04/18  4:28 AM  Result Value Ref Range   MRSA by PCR NEGATIVE NEGATIVE    Comment:        The GeneXpert MRSA Assay (FDA approved for NASAL specimens only), is one component of a comprehensive MRSA  colonization surveillance program. It is not intended to diagnose MRSA infection nor to guide or monitor treatment for MRSA infections. Performed at [facility], [address] [place], [place] [address]   Glucose, capillary     Status: Abnormal   Collection Time: 04/04/18  4:29 AM  Result Value Ref Range   Glucose-Capillary 115 (H) 65 - 99 mg/dL  Procalcitonin - Baseline     Status: None   Collection Time: 04/04/18  5:35 AM  Result Value Ref Range   Procalcitonin <0.10 ng/mL    Comment:        Interpretation: PCT (Procalcitonin) <= 0.5 ng/mL: Systemic infection (sepsis) is not likely. Local bacterial infection is possible. (NOTE)       Sepsis PCT Algorithm           Lower Respiratory Tract                                      Infection PCT Algorithm    ----------------------------     ----------------------------         PCT < 0.25 ng/mL                PCT < 0.10 ng/mL         Strongly encourage             Strongly discourage   discontinuation of antibiotics    initiation of antibiotics    ----------------------------     -----------------------------       PCT 0.25 - 0.50 ng/mL            PCT 0.10 - 0.25 ng/mL               OR       >80% decrease in PCT            Discourage initiation of                                            antibiotics      Encourage discontinuation           of antibiotics    ----------------------------     -----------------------------         PCT >= 0.50 ng/mL              PCT 0.26 - 0.50 ng/mL  AND        <80% decrease in PCT             Encourage initiation of                                             antibiotics       Encourage continuation           of antibiotics    ----------------------------     -----------------------------        PCT >= 0.50 ng/mL                  PCT > 0.50 ng/mL               AND         increase in PCT                  Strongly encourage                                       initiation of antibiotics    Strongly encourage escalation           of antibiotics                                     -----------------------------                                           PCT <= 0.25 ng/mL                                                 OR                                        > 80% decrease in PCT                                     Discontinue / Do not initiate                                             antibiotics Performed at [facility], [address] [place], [place] [address]   Glucose, capillary     Status: None   Collection Time: 04/04/18  7:34 AM  Result Value Ref Range   Glucose-Capillary 84 65 - 99 mg/dL  Glucose, capillary     Status: Abnormal   Collection Time: 04/04/18 11:40 AM  Result Value Ref Range   Glucose-Capillary 127 (H) 65 - 99 mg/dL  Glucose, capillary     Status: Abnormal   Collection Time: 04/04/18  4:24 PM  Result Value Ref Range   Glucose-Capillary 112 (H) 65 -  99 mg/dL  Glucose, capillary     Status: Abnormal   Collection Time: 04/04/18  9:00 PM  Result Value Ref Range   Glucose-Capillary 107 (H) 65 - 99 mg/dL   Comment 1 Notify RN    Comment 2 Document in Chart   Procalcitonin     Status: None   Collection Time: 04/05/18  4:57 AM  Result Value Ref Range   Procalcitonin <0.10 ng/mL    Comment:        Interpretation: PCT (Procalcitonin) <= 0.5 ng/mL: Systemic infection (sepsis) is not likely. Local bacterial infection is possible. (NOTE)       Sepsis PCT Algorithm           Lower Respiratory Tract                                      Infection PCT Algorithm    ----------------------------     ----------------------------         PCT < 0.25 ng/mL                PCT < 0.10 ng/mL         Strongly encourage             Strongly discourage   discontinuation of antibiotics    initiation of antibiotics    ----------------------------     -----------------------------       PCT 0.25 - 0.50 ng/mL            PCT  0.10 - 0.25 ng/mL               OR       >80% decrease in PCT            Discourage initiation of                                            antibiotics      Encourage discontinuation           of antibiotics    ----------------------------     -----------------------------         PCT >= 0.50 ng/mL              PCT 0.26 - 0.50 ng/mL               AND        <80% decrease in PCT             Encourage initiation of                                             antibiotics       Encourage continuation           of antibiotics    ----------------------------     -----------------------------        PCT >= 0.50 ng/mL                  PCT > 0.50 ng/mL               AND         increase in PCT  Strongly encourage                                      initiation of antibiotics    Strongly encourage escalation           of antibiotics                                     -----------------------------                                           PCT <= 0.25 ng/mL                                                 OR                                        > 80% decrease in PCT                                     Discontinue / Do not initiate                                             antibiotics Performed at [facility], [address] [place], [place] [address]   Glucose, capillary     Status: Abnormal   Collection Time: 04/05/18  7:30 AM  Result Value Ref Range   Glucose-Capillary 122 (H) 65 - 99 mg/dL  Glucose, capillary     Status: Abnormal   Collection Time: 04/05/18 11:51 AM  Result Value Ref Range   Glucose-Capillary 117 (H) 65 - 99 mg/dL  Urine Culture     Status: None   Collection Time: 04/05/18 12:48 PM  Result Value Ref Range   Specimen Description      URINE, RANDOM Performed at [facility], [address], [place], [place] [address]    Special Requests      Normal Performed at [facility], [address], [place], [place]  [address]    Culture      NO GROWTH Performed at [facility], [place] [address], [place], [place] [address]    Report Status 04/06/2018 FINAL   Glucose, capillary     Status: Abnormal   Collection Time: 04/05/18  4:57 PM  Result Value Ref Range   Glucose-Capillary 109 (H) 65 - 99 mg/dL   Comment 1 Notify RN   Glucose, capillary     Status: Abnormal   Collection Time: 04/05/18  9:16 PM  Result Value Ref Range   Glucose-Capillary 135 (H) 65 - 99 mg/dL  Procalcitonin     Status: None   Collection Time: 04/06/18  5:28 AM  Result Value Ref Range   Procalcitonin <0.10 ng/mL    Comment:  Interpretation: PCT (Procalcitonin) <= 0.5 ng/mL: Systemic infection (sepsis) is not likely. Local bacterial infection is possible. (NOTE)       Sepsis PCT Algorithm           Lower Respiratory Tract                                      Infection PCT Algorithm    ----------------------------     ----------------------------         PCT < 0.25 ng/mL                PCT < 0.10 ng/mL         Strongly encourage             Strongly discourage   discontinuation of antibiotics    initiation of antibiotics    ----------------------------     -----------------------------       PCT 0.25 - 0.50 ng/mL            PCT 0.10 - 0.25 ng/mL               OR       >80% decrease in PCT            Discourage initiation of                                            antibiotics      Encourage discontinuation           of antibiotics    ----------------------------     -----------------------------         PCT >= 0.50 ng/mL              PCT 0.26 - 0.50 ng/mL               AND        <80% decrease in PCT             Encourage initiation of                                             antibiotics       Encourage continuation           of antibiotics    ----------------------------     -----------------------------        PCT >= 0.50 ng/mL                  PCT > 0.50 ng/mL               AND         increase in  PCT                  Strongly encourage                                      initiation of antibiotics    Strongly encourage escalation           of antibiotics                                     -----------------------------  PCT <= 0.25 ng/mL                                                 OR                                        > 80% decrease in PCT                                     Discontinue / Do not initiate                                             antibiotics Performed at [facility], [address] [place], [place] [address]   CBC     Status: Abnormal   Collection Time: 04/06/18  5:28 AM  Result Value Ref Range   WBC 6.1 3.6 - 11.0 K/uL   RBC 2.79 (L) 3.80 - 5.20 MIL/uL   Hemoglobin 9.4 (L) 12.0 - 16.0 g/dL   HCT 26.2 (L) 35.0 - 47.0 %   MCV 94.0 80.0 - 100.0 fL   MCH 33.5 26.0 - 34.0 pg   MCHC 35.7 32.0 - 36.0 g/dL   RDW 15.8 (H) 11.5 - 14.5 %   Platelets 331 150 - 440 K/uL    Comment: Performed at [facility], [address] [place], [place] [address]  Basic metabolic panel     Status: Abnormal   Collection Time: 04/06/18  5:28 AM  Result Value Ref Range   Sodium 140 135 - 145 mmol/L   Potassium 3.7 3.5 - 5.1 mmol/L   Chloride 106 101 - 111 mmol/L   CO2 28 22 - 32 mmol/L   Glucose, Bld 119 (H) 65 - 99 mg/dL   BUN 11 6 - 20 mg/dL   Creatinine, Ser 0.85 0.44 - 1.00 mg/dL   Calcium 7.4 (L) 8.9 - 10.3 mg/dL   GFR calc non Af Amer 59 (L) >60 mL/min   GFR calc Af Amer >60 >60 mL/min    Comment: (NOTE) The eGFR has been calculated using the CKD EPI equation. This calculation has not been validated in all clinical situations. eGFR's persistently <60 mL/min signify possible Chronic Kidney Disease.    Anion gap 6 5 - 15    Comment: Performed at [facility], [address] [place],  [address]  Protime-INR     Status: Abnormal   Collection Time: 04/06/18  5:28 AM  Result Value  Ref Range   Prothrombin Time 16.3 (H) 11.4 - 15.2 seconds   INR 1.32     Comment: Performed at [facility], [address] [place],  [address]  Glucose, capillary     Status: Abnormal   Collection Time: 04/06/18  7:48 AM  Result Value Ref Range   Glucose-Capillary 115 (H) 65 - 99 mg/dL   Comment 1 Notify RN   Glucose, capillary     Status: Abnormal   Collection Time: 04/06/18 11:55 AM  Result Value Ref Range   Glucose-Capillary 109 (H) 65 - 99 mg/dL  Comment 1 Notify RN   Glucose, capillary     Status: Abnormal   Collection Time: 04/06/18  4:37 PM  Result Value Ref Range   Glucose-Capillary 129 (H) 65 - 99 mg/dL   Comment 1 Notify RN   Glucose, capillary     Status: Abnormal   Collection Time: 04/06/18  9:20 PM  Result Value Ref Range   Glucose-Capillary 123 (H) 65 - 99 mg/dL  Protime-INR     Status: Abnormal   Collection Time: 04/07/18  4:51 AM  Result Value Ref Range   Prothrombin Time 16.7 (H) 11.4 - 15.2 seconds   INR 1.36     Comment: Performed at [facility], [address] [place], [place] [address]  Glucose, capillary     Status: Abnormal   Collection Time: 04/07/18  7:49 AM  Result Value Ref Range   Glucose-Capillary 103 (H) 65 - 99 mg/dL   Comment 1 Notify RN   Glucose, capillary     Status: Abnormal   Collection Time: 04/07/18 12:02 PM  Result Value Ref Range   Glucose-Capillary 122 (H) 65 - 99 mg/dL   Comment 1 Notify RN   Glucose, capillary     Status: Abnormal   Collection Time: 04/07/18  4:45 PM  Result Value Ref Range   Glucose-Capillary 118 (H) 65 - 99 mg/dL   Comment 1 Notify RN   Glucose, capillary     Status: None   Collection Time: 04/07/18  9:34 PM  Result Value Ref Range   Glucose-Capillary 90 65 - 99 mg/dL   Comment 1 Notify RN   Protime-INR     Status: Abnormal   Collection Time: 04/08/18  5:01 AM  Result Value Ref Range   Prothrombin Time 18.3 (H) 11.4 - 15.2 seconds   INR 1.53     Comment: Performed at  [name], [address] [place], [place] [address]  Glucose, capillary     Status: Abnormal   Collection Time: 04/08/18  7:51 AM  Result Value Ref Range   Glucose-Capillary 111 (H) 65 - 99 mg/dL  Glucose, capillary     Status: None   Collection Time: 04/08/18 11:50 AM  Result Value Ref Range   Glucose-Capillary 95 65 - 99 mg/dL   Comment 1 Notify RN   Protime-INR     Status: Abnormal   Collection Time: 04/09/18 11:24 AM  Result Value Ref Range   INR 1.4 (H) 0.8 - 1.2    Comment: Reference interval is for non-anticoagulated patients. Suggested INR therapeutic range for Vitamin K antagonist therapy:    Standard Dose (moderate intensity                   therapeutic range):       2.0 - 3.0    Higher intensity therapeutic range       2.5 - 3.5    Prothrombin Time 14.4 (H) 9.1 - 12.0 sec  POCT HgB A1C     Status: None   Collection Time: 04/11/18 11:05 AM  Result Value Ref Range   Hemoglobin A1C 5.9   Vitamin B12     Status: None   Collection Time: 04/30/18  1:08 PM  Result Value Ref Range   Vitamin B-12 432 180 - 914 pg/mL    Comment: (NOTE) This assay is not validated for testing neonatal or myeloproliferative syndrome specimens for Vitamin B12 levels. Performed at [facility], [place] [address] [place], [place] [address]  Folate     Status: None   Collection Time: 04/30/18  1:08 PM  Result Value Ref Range   Folate 39.0 >5.9 ng/mL    Comment: Performed at [facility], [address] [place], [place] [address]  Ferritin     Status: None   Collection Time: 04/30/18  1:08 PM  Result Value Ref Range   Ferritin 21 11 - 307 ng/mL    Comment: Performed at [facility], [address] [place], [place] [address]  Iron and TIBC     Status: Abnormal   Collection Time: 04/30/18  1:08 PM  Result Value Ref Range   Iron 13 (L) 28 - 170 ug/dL   TIBC 189 (L) 250 - 450 ug/dL   Saturation Ratios 7 (L) 10.4 - 31.8 %   UIBC 176 ug/dL     Comment: Performed at [facility], [address] [place], [place] [address]  APTT     Status: Abnormal   Collection Time: 04/30/18  1:08 PM  Result Value Ref Range   aPTT 88 (H) 24 - 36 seconds    Comment:        IF BASELINE aPTT IS ELEVATED, SUGGEST PATIENT RISK ASSESSMENT BE USED TO DETERMINE APPROPRIATE ANTICOAGULANT THERAPY. Performed at [facility], [address] [place], [place] [address]   Protime-INR     Status: Abnormal   Collection Time: 04/30/18  1:08 PM  Result Value Ref Range   Prothrombin Time 39.4 (H) 11.4 - 15.2 seconds   INR 4.09 (HH)     Comment: RESULT REPEATED AND VERIFIED CRITICAL RESULT CALLED TO, READ BACK BY AND VERIFIED WITH: COURTNEY GARRETT 04/30/18 Josephine at [facility], [address] [place], [place] [address]   Prothrombin gene mutation     Status: None   Collection Time: 04/30/18  1:08 PM  Result Value Ref Range   Recommendations-PTGENE: Comment     Comment: (NOTE) NEGATIVE No mutation identified. Comment: A point mutation (G20210A) in the factor II (prothrombin) gene is the second most common cause of inherited thrombophilia. The incidence of this mutation in the [place] Caucasian population is about 2% and in the [place] American population it is approximately 0.5%. This mutation is rare in the [place] and Native American population. Being heterozygous for a prothrombin mutation increases the risk for developing venous thrombosis about 2 to 3 times above the general population risk. Being homozygous for the prothrombin gene mutation increases the relative risk for venous thrombosis further, although it is not yet known how much further the risk is increased. In women heterozygous for the prothrombin gene mutation, the use of estrogen containing oral contraceptives increases the relative risk of venous thrombosis about 16 times and the risk of developing cerebral thrombosis is also significantly  increased. In pregnancy the pr othrombin gene mutation increases risk for venous thrombosis and may increase risk for stillbirth, placental abruption, pre-eclampsia and fetal growth restriction. If the patient possesses two or more congenital or acquired thrombophilic risk factors, the risk for thrombosis may rise to more than the sum of the risk ratios for the individual mutations. This assay detects only the prothrombin G20210A mutation and does not measure genetic abnormalities elsewhere in the genome. Other thrombotic risk factors may be pursued through systematic clinical laboratory analysis. These factors include the R506Q (Leiden) mutation in the Factor V gene, plasma homocysteine levels, as well as testing for deficiencies of antithrombin III, protein C and protein S. Genetic Counselors  are available for health care providers to discuss results at 1-800-345-GENE [phone]). Methodology: DNA analysis of the Factor II gene was performed by PCR amplification followed by restriction analysis. The di agnostic sensitivity is >99% for both. All the tests must be combined with clinical information for the most accurate interpretation. Molecular-based testing is highly accurate, but as in any laboratory test, diagnostic errors may occur. This test was developed and its performance characteristics determined by [facility]. It has not been cleared or approved by the Food and Drug Administration. Poort SR, et al. Blood. 1996; 48:1856-3149. Varga EA. Circulation. 2004; 702:O37-C58. Mervin Hack, et Burna; 19:700-703. Allison Quarry, PhD, [facility] Ruben Reason, PhD, [facility] Annetta Maw, M.S., PhD, [facility] Alfredo Bach, PhD, [facility] Norva Riffle, PhD, [facility] Earlean Polka, PhD, [facility] Performed At: [facility] [address] [place], [place] [id number] Nechama Guard MD IN:[id number] Performed at [facility], [address]  [place], [place] [address]   Factor 5 leiden     Status: None   Collection Time: 04/30/18  1:08 PM  Result Value Ref Range   Recommendations-F5LEID: Comment     Comment: (NOTE) Result:  Negative (no mutation found) Factor V Leiden is a specific mutation (R506Q) in the factor V gene that is associated with an increased risk of venous thrombosis. Factor V Leiden is more resistant to inactivation by activated protein C.  As a result, factor V persists in the circulation leading to a mild hyper- coagulable state.  The Leiden mutation accounts for 90% - 95% of APC resistance.  Factor V Leiden has been reported in patients with deep vein thrombosis, pulmonary embolus, central retinal vein occlusion, cerebral sinus thrombosis and hepatic vein thrombosis. Other risk factors to be considered in the workup for venous thrombosis include the G20210A mutation in the factor II (prothrombin) gene, protein S and C deficiency, and antithrombin deficiencies. Anticardiolipin antibody and lupus anticoagulant analysis may be appropriate for certain patients, as well as homocysteine levels. Contact your local [facility] for information on how to order additi onal testing if desired. **Genetic counselors are available for health care providers to**  discuss results at 1-800-345-GENE [phone]). Methodology: DNA analysis of the Factor V gene was performed by allele-specific PCR. The diagnostic sensitivity and specificity is >99% for both. Molecular-based testing is highly accurate, but as in any laboratory test, diagnostic errors may occur. All test results must be combined with clinical information for the most accurate interpretation. This test was developed and its performance characteristics determined by [facility]. It has not been cleared or approved by the Food and Drug Administration. References: Voelkerding K (1996).  Clin Lab Med [id number]. Allison Quarry, PhD, [facility] Ruben Reason, PhD,  [facility] Annetta Maw, M.S., PhD, [facility] Alfredo Bach, PhD, [facility] Norva Riffle, PhD, [facility] Earlean Polka PhD, [facility] Performed At: [facility] [address] [place], [place] [id number] Nechama Guard MD EX:[id number] Performed at [facility], [address] [place], [place] [address]   CBC with Differential/Platelet     Status: Abnormal   Collection Time: 04/30/18  1:08 PM  Result Value Ref Range   WBC 7.8 3.6 - 11.0 K/uL   RBC 3.77 (L) 3.80 - 5.20 MIL/uL   Hemoglobin 11.5 (L) 12.0 - 16.0 g/dL   HCT 34.6 (L) 35.0 - 47.0 %   MCV 91.8 80.0 - 100.0 fL   MCH 30.6 26.0 - 34.0 pg   MCHC 33.3 32.0 - 36.0  g/dL   RDW 15.8 (H) 11.5 - 14.5 %   Platelets 336 150 - 440 K/uL   Neutrophils Relative % 67 %   Neutro Abs 5.2 1.4 - 6.5 K/uL   Lymphocytes Relative 20 %   Lymphs Abs 1.6 1.0 - 3.6 K/uL   Monocytes Relative 11 %   Monocytes Absolute 0.9 0.2 - 0.9 K/uL   Eosinophils Relative 1 %   Eosinophils Absolute 0.1 0 - 0.7 K/uL   Basophils Relative 1 %   Basophils Absolute 0.0 0 - 0.1 K/uL    Comment: Performed at [facility], [address] [place], [place] [address]  INR/PT     Status: Abnormal   Collection Time: 05/02/18 10:52 AM  Result Value Ref Range   INR 2.6 (H) 0.8 - 1.0 ratio   Prothrombin Time 29.8 (H) 9.6 - 13.1 sec  Protime-INR     Status: Abnormal   Collection Time: 05/14/18  6:18 PM  Result Value Ref Range   Prothrombin Time 17.8 (H) 11.4 - 15.2 seconds   INR 1.48     Comment: Performed at [facility], [address] [place], [place] [address]  Protime-INR     Status: Abnormal   Collection Time: 05/15/18 11:16 AM  Result Value Ref Range   Prothrombin Time 16.6 (H) 11.4 - 15.2 seconds   INR 1.35     Comment: Performed at [facility], [address], [place], [place] [address]  Basic metabolic panel     Status: Abnormal   Collection Time: 05/15/18 11:16 AM  Result Value Ref Range   Sodium 139 135 - 145 mmol/L   Potassium  4.1 3.5 - 5.1 mmol/L   Chloride 103 101 - 111 mmol/L   CO2 26 22 - 32 mmol/L   Glucose, Bld 126 (H) 65 - 99 mg/dL   BUN 15 6 - 20 mg/dL   Creatinine, Ser 1.05 (H) 0.44 - 1.00 mg/dL   Calcium 8.7 (L) 8.9 - 10.3 mg/dL   GFR calc non Af Amer 45 (L) >60 mL/min   GFR calc Af Amer 53 (L) >60 mL/min    Comment: (NOTE) The eGFR has been calculated using the CKD EPI equation. This calculation has not been validated in all clinical situations. eGFR's persistently <60 mL/min signify possible Chronic Kidney Disease.    Anion gap 10 5 - 15    Comment: Performed at [facility], [address] [place], [place] [address]  CBC with Differential/Platelet     Status: Abnormal   Collection Time: 05/15/18 11:16 AM  Result Value Ref Range   WBC 5.7 3.6 - 11.0 K/uL   RBC 3.89 3.80 - 5.20 MIL/uL   Hemoglobin 11.6 (L) 12.0 - 16.0 g/dL   HCT 35.1 35.0 - 47.0 %   MCV 90.2 80.0 - 100.0 fL   MCH 29.7 26.0 - 34.0 pg   MCHC 32.9 32.0 - 36.0 g/dL   RDW 16.7 (H) 11.5 - 14.5 %   Platelets 307 150 - 440 K/uL   Neutrophils Relative % 66 %   Neutro Abs 3.8 1.4 - 6.5 K/uL   Lymphocytes Relative 22 %   Lymphs Abs 1.2 1.0 - 3.6 K/uL   Monocytes Relative 10 %   Monocytes Absolute 0.6 0.2 - 0.9 K/uL   Eosinophils Relative 1 %   Eosinophils Absolute 0.1 0 - 0.7 K/uL   Basophils Relative 1 %   Basophils Absolute 0.0 0 - 0.1 K/uL    Comment: Performed  at [facility], [address] [place], [place] [address]  Basic metabolic panel     Status: Abnormal   Collection Time: 05/22/18 11:14 AM  Result Value Ref Range   Sodium 138 135 - 145 mmol/L   Potassium 3.8 3.5 - 5.1 mmol/L   Chloride 102 101 - 111 mmol/L   CO2 27 22 - 32 mmol/L   Glucose, Bld 113 (H) 65 - 99 mg/dL   BUN 12 6 - 20 mg/dL   Creatinine, Ser 1.00 0.44 - 1.00 mg/dL   Calcium 8.3 (L) 8.9 - 10.3 mg/dL   GFR calc non Af Amer 48 (L) >60 mL/min   GFR calc Af Amer 56 (L) >60 mL/min    Comment: (NOTE) The eGFR has been calculated using  the CKD EPI equation. This calculation has not been validated in all clinical situations. eGFR's persistently <60 mL/min signify possible Chronic Kidney Disease.    Anion gap 9 5 - 15    Comment: Performed at [facility], [address] [place], [place] [address]  Protime-INR     Status: Abnormal   Collection Time: 05/22/18 11:14 AM  Result Value Ref Range   Prothrombin Time 17.6 (H) 11.4 - 15.2 seconds   INR 1.46     Comment: Performed at [facility], [address] [place], [place] [address]  CBC with Differential/Platelet     Status: Abnormal   Collection Time: 05/22/18 11:14 AM  Result Value Ref Range   WBC 4.8 3.6 - 11.0 K/uL   RBC 3.76 (L) 3.80 - 5.20 MIL/uL   Hemoglobin 11.0 (L) 12.0 - 16.0 g/dL   HCT 33.6 (L) 35.0 - 47.0 %   MCV 89.4 80.0 - 100.0 fL   MCH 29.4 26.0 - 34.0 pg   MCHC 32.9 32.0 - 36.0 g/dL   RDW 16.9 (H) 11.5 - 14.5 %   Platelets 298 150 - 440 K/uL   Neutrophils Relative % 63 %   Neutro Abs 3.0 1.4 - 6.5 K/uL   Lymphocytes Relative 25 %   Lymphs Abs 1.2 1.0 - 3.6 K/uL   Monocytes Relative 9 %   Monocytes Absolute 0.4 0.2 - 0.9 K/uL   Eosinophils Relative 2 %   Eosinophils Absolute 0.1 0 - 0.7 K/uL   Basophils Relative 1 %   Basophils Absolute 0.0 0 - 0.1 K/uL    Comment: Performed at [facility], [address] [place], [place] [address]   Objective  Body mass index is 29.85 kg/m. Wt Readings from Last 3 Encounters:  06/04/18 190 lb 9.6 oz (86.5 kg)  05/22/18 188 lb 12.8 oz (85.6 kg)  05/17/18 184 lb 3.2 oz (83.6 kg)   Temp Readings from Last 3 Encounters:  06/04/18 97.6 F (36.4 C) (Oral)  05/22/18 98.4 F (36.9 C) (Tympanic)  05/17/18 98.3 F (36.8 C) (Oral)   BP Readings from Last 3 Encounters:  06/04/18 126/68  05/22/18 125/73  05/17/18 134/76   Pulse Readings from Last 3 Encounters:  06/04/18 80  05/22/18 72  05/17/18 87    Physical Exam  Constitutional: She is oriented to person, place, and  time. Vital signs are normal. She appears well-developed and well-nourished. She is cooperative.  HENT:  Head: Normocephalic and atraumatic.  Mouth/Throat: Oropharynx is clear and moist and mucous membranes are normal.  Eyes: Pupils are equal, round, and reactive to light. Conjunctivae are normal.  Cardiovascular: Normal rate, regular rhythm and normal heart sounds.  Pulmonary/Chest: Effort normal  and breath sounds normal.  Neurological: She is alert and oriented to person, place, and time. Gait normal.  Using rollator today   Skin: Skin is warm, dry and intact.  Right ear skin lesion with dried blood   Psychiatric: She has a normal mood and affect. Her speech is normal and behavior is normal. Judgment and thought content normal. Cognition and memory are normal.  Nursing note and vitals reviewed. A:  1. Leg edema  2. Depression /insmonia PH! 9 score 4 today  3. DVT b/l leg INR 2.5 06/03/18  4. DM2 5. Hypothyroidism  6. Reduced hearing  7. Skin cancer history and new skin lesion righ t ear and Aks to face  8. Urinary hesistancy  9.HM 10. H/o pneumonia bronchiectasis    P: 1.  Lasix 40 mg qam and 20 mg q lunch x 1 week then continue 40 mg qam   2.  Cont remeron 15 mg qhs and melatonin 5 mg qhs   3.  Forward INR to Dr. Tasia Catchings for management  Elenore Paddy H/H RN  4. Cont metformin   5.  synthryoid 200 mcg 6/7 days  Check TSH today  6.  Refer to [facility] ENT reduced hearing  Some wax in b/l ears not impacted rec use debrox ear drops otc 2x per month 4-7 days   7.  Refer to Dr. Evorn Gong tbse  8. UA and culture today  9. Fluhad pna 23 vx had 10/22/07this will need to be updated in futureat f/u prevnar had 09/20/16 Tdap 08/04/16 shingrixnot had out of stock called pharmacy,? zostavaxif had  F/u dermatology has established to f/u referred today   Out of age window pap, colonoscopy, mammo DEXA 01/01/08 osteopeniawill make sure on ca 600 bid and vit D 1000 iu  qd -Vitamin d 03/15/18 48.1      10.  Repeat CT chest in 3 months   Provider: Dr. Olivia Mackie [facility]

## 2018-06-04 NOTE — Telephone Encounter (Signed)
Yes  Caregiver feels comfortable doing this   [place]

## 2018-06-05 ENCOUNTER — Encounter: Payer: Self-pay | Admitting: Internal Medicine

## 2018-06-05 ENCOUNTER — Telehealth: Payer: Self-pay | Admitting: Internal Medicine

## 2018-06-05 LAB — URINALYSIS, ROUTINE W REFLEX MICROSCOPIC
Bilirubin, UA: NEGATIVE
Glucose, UA: NEGATIVE
Ketones, UA: NEGATIVE
Leukocytes, UA: NEGATIVE
Nitrite, UA: NEGATIVE
Protein, UA: NEGATIVE
RBC, UA: NEGATIVE
Specific Gravity, UA: 1.011 (ref 1.005–1.030)
Urobilinogen, Ur: 0.2 mg/dL (ref 0.2–1.0)
pH, UA: 5 (ref 5.0–7.5)

## 2018-06-05 LAB — PROTIME-INR

## 2018-06-05 NOTE — Telephone Encounter (Signed)
Please call shelia with [facility] and cancel H/H nurse visit   Best number sheila [phone] Thanks TSM

## 2018-06-05 NOTE — Telephone Encounter (Signed)
Orders given as directed.

## 2018-06-06 LAB — URINE CULTURE

## 2018-06-07 ENCOUNTER — Ambulatory Visit: Payer: Self-pay | Admitting: *Deleted

## 2018-06-07 NOTE — Telephone Encounter (Signed)
Caregiver Herbert Seta called regarding the patient having a redden area on the right leg above the calf. There is some tenderness there. She stated the bruise is as wide as two fingers but has not gotten any bigger since last seen in the office about 2 weeks ago. She is able to ambulate and ride her exercise bike with difficulty.  Appointment schedule, per protocol. Advised to call back for worsening or increase in symptoms. Caregiver voiced understanding. Will route to flow at [facility].   Reason for Disposition . [1] MILD pain (e.g., does not interfere with normal activities) AND [2] present > 7 days  Answer Assessment - Initial Assessment Questions 1. ONSET: "When did the pain start?"      2 weeks ago 2. LOCATION: "Where is the pain located?"      Right leg 3. PAIN: "How bad is the pain?"    (Scale 1-10; or mild, moderate, severe)   -  MILD (1-3): doesn't interfere with normal activities    -  MODERATE (4-7): interferes with normal activities (e.g., work or school) or awakens from sleep, limping    -  SEVERE (8-10): excruciating pain, unable to do any normal activities, unable to walk     tenderness 4. WORK OR EXERCISE: "Has there been any recent work or exercise that involved this part of the body?"      no 5. CAUSE: "What do you think is causing the leg pain?"     Not sure 6. OTHER SYMPTOMS: "Do you have any other symptoms?" (e.g., chest pain, back pain, breathing difficulty, swelling, rash, fever, numbness, weakness)     Swelling in feet yesterday, gone today  Protocols used: LEG PAIN-A-AH

## 2018-06-07 NOTE — Telephone Encounter (Signed)
Appointment scheduled for 06/17/18 [facility]

## 2018-06-11 NOTE — Telephone Encounter (Signed)
Spoke to [name]-he is aware of Koralynn's CT in September.  He asks if Dr. Olivia Mackie can write a letter for him. He states that he is his moms guardian and gets audited every year because he has to turn in yearly accounting for his moms long term care (24 hrs). He states that now the new "deputy" is questioning who ordered Bryauna to have long term care. [name] feels that she needs long term care since her fall in 09/2015 and that any and everything she does she needs help with. He said that he has to write weekly checks to the agency that cares for her and they in return turns in their weekly time sheets.  Would you be able to write a letter for him regarding her need for long term? He states that he will come by and pick it up. [name] call back for any questions [phone]

## 2018-06-12 LAB — PROTIME-INR: INR: 2.5 — AB (ref 0.9–1.1)

## 2018-06-17 ENCOUNTER — Ambulatory Visit: Payer: Medicare Other | Admitting: Internal Medicine

## 2018-06-19 LAB — PROTIME-INR: INR: 2.1 — AB (ref 0.9–1.1)

## 2018-06-25 ENCOUNTER — Telehealth: Payer: Self-pay | Admitting: Internal Medicine

## 2018-06-25 ENCOUNTER — Telehealth: Payer: Self-pay

## 2018-06-25 NOTE — Telephone Encounter (Signed)
Copied from [place] [phone]. Topic: General - Other >> Jun 25, 2018  4:56 PM Valla Leaver wrote: Reason for CRM: Patient's son Dionna Wiedemann, calling for a letter stating that the patient needs 24/7 home care or needs to be in a facility. He says he requested this 2 weeks ago but nothing in chart.

## 2018-06-25 NOTE — Telephone Encounter (Signed)
Please advise 

## 2018-06-25 NOTE — Telephone Encounter (Unsigned)
Copied from [place] [phone]. Topic: Quick Communication - See Telephone Encounter >> Jun 25, 2018 10:17 AM Neva Seat wrote: Tressie Stalker is pt caregiver - cell [phone]  Pt is needing something to take in between times of taking the carbamazepine (TEGRETOL) 200 MG tablet.  Pt is being waned off and is still needing pain medication. Please call caregiver to discuss.

## 2018-06-26 ENCOUNTER — Encounter: Payer: Self-pay | Admitting: Internal Medicine

## 2018-06-26 LAB — PROTIME-INR

## 2018-06-26 NOTE — Telephone Encounter (Signed)
Ready for pick up   [place]

## 2018-06-26 NOTE — Telephone Encounter (Signed)
I have not had time to do the letter but will try to do today   [place]

## 2018-06-26 NOTE — Telephone Encounter (Signed)
Per Dr.Mclean informed son that he can pick up letter today. Letter will be waiting at front desk for patients son.

## 2018-06-26 NOTE — Telephone Encounter (Signed)
Please advise 

## 2018-07-01 ENCOUNTER — Other Ambulatory Visit: Payer: Self-pay | Admitting: Internal Medicine

## 2018-07-01 DIAGNOSIS — I824Y3 Acute embolism and thrombosis of unspecified deep veins of proximal lower extremity, bilateral: Secondary | ICD-10-CM

## 2018-07-01 MED ORDER — WARFARIN SODIUM 10 MG PO TABS: ORAL_TABLET | ORAL | 3 refills | Status: DC

## 2018-07-01 MED ORDER — WARFARIN SODIUM 5 MG PO TABS: ORAL_TABLET | ORAL | 3 refills | Status: DC

## 2018-07-01 NOTE — Telephone Encounter (Signed)
rx refill

## 2018-07-01 NOTE — Telephone Encounter (Signed)
Coumadin refill Last OV:06/04/18 Last refill:05/17/18 90 tab/0 refill WCN:[id number] Pharmacy: [place], [place] - [place] [phone] (Phone) [phone] (Fax)

## 2018-07-01 NOTE — Telephone Encounter (Signed)
Copied from [place] [phone]. Topic: Quick Communication - See Telephone Encounter >> Jul 01, 2018  9:22 AM Hewitt Shorts wrote: Pt is needing to have a refill on her coumadin 10 mg   [facility] [phone]  Best number [phone] ask for christine locklear-care giver

## 2018-07-03 ENCOUNTER — Other Ambulatory Visit: Payer: Self-pay | Admitting: Internal Medicine

## 2018-07-03 ENCOUNTER — Telehealth: Payer: Self-pay | Admitting: Internal Medicine

## 2018-07-03 DIAGNOSIS — Z5181 Encounter for therapeutic drug level monitoring: Secondary | ICD-10-CM

## 2018-07-03 DIAGNOSIS — Z7901 Long term (current) use of anticoagulants: Principal | ICD-10-CM

## 2018-07-03 MED ORDER — WARFARIN SODIUM 1 MG PO TABS: 1.0000 mg | ORAL_TABLET | ORAL | 3 refills | Status: DC

## 2018-07-03 NOTE — Telephone Encounter (Signed)
Copied from [place] [phone]. Topic: General - Other >> Jul 03, 2018 12:56 PM Keene Breath wrote: Reason for CRM: Danae Chen with [facility] called to report an out of range INR of 1.8 on 07/03/18.  Please advise.  CB# [phone]

## 2018-07-03 NOTE — Telephone Encounter (Signed)
INR results °

## 2018-07-03 NOTE — Telephone Encounter (Signed)
Patients son [name] ( on [place]) notified to to adjust patients medication to 16 mg MWF and 15mg  on the other days. He verbalized understanding.

## 2018-07-03 NOTE — Telephone Encounter (Signed)
Take 16 mg (10 mg +5 mg +1 mg) on MWF other days 15 mg total

## 2018-07-10 NOTE — Telephone Encounter (Signed)
Left message for caretaker Docia Chuck   Pt has undergone multiple gamma knifes for trigeminal neuralgia and seen neurosurgery at [facility] Dr. Salomon Fick 02/07/18  -carbamazepine 200 mg 2x per day was controlling symptoms but when increased dose has side effects.    Other medications to treat would need to come from neurosurgery (NS) or neurology other options would be combination with gabapentin, lamotrigine, topamax, baclofen, or zanaflex, or oral lidocaine. Botox also would be an option for a specialist who deals with this but trigeminal neuralgia is usually not managed by primary care if it is this bad.   It pt or family wanted to try zanaflex this would be the only option I could offer otherwise f/u with NS or neurology follow up rec.    Please mail letter to pt stating this

## 2018-07-11 ENCOUNTER — Telehealth: Payer: Self-pay

## 2018-07-11 ENCOUNTER — Other Ambulatory Visit: Payer: Self-pay | Admitting: Internal Medicine

## 2018-07-11 DIAGNOSIS — Z7901 Long term (current) use of anticoagulants: Principal | ICD-10-CM

## 2018-07-11 DIAGNOSIS — Z5181 Encounter for therapeutic drug level monitoring: Secondary | ICD-10-CM

## 2018-07-11 DIAGNOSIS — I824Y3 Acute embolism and thrombosis of unspecified deep veins of proximal lower extremity, bilateral: Secondary | ICD-10-CM

## 2018-07-11 MED ORDER — WARFARIN SODIUM 10 MG PO TABS: ORAL_TABLET | ORAL | 3 refills | Status: DC

## 2018-07-11 MED ORDER — WARFARIN SODIUM 1 MG PO TABS: ORAL_TABLET | ORAL | 3 refills | Status: DC

## 2018-07-11 NOTE — Telephone Encounter (Signed)
Spoken to [facility].  They reported a critical lab 1.9 INR. Please advise if any changes are needed.

## 2018-07-11 NOTE — Telephone Encounter (Signed)
INR 1.9   Take 16 mg on Mon Wed Fri Sunday other days 15 mg total  [place]

## 2018-07-12 NOTE — Telephone Encounter (Signed)
Patients son was informed of results.  Patients son understood and no questions, comments, or concerns at this time.

## 2018-07-17 ENCOUNTER — Telehealth: Payer: Self-pay | Admitting: Internal Medicine

## 2018-07-17 LAB — PROTIME-INR
INR: 2.1 — AB (ref 0.9–1.1)
INR: 2.1 — AB (ref 0.9–1.1)

## 2018-07-17 NOTE — Telephone Encounter (Signed)
Copied from [place] [phone]. Topic: Quick Communication - See Telephone Encounter >> Jul 17, 2018  2:42 PM Berneta Levins wrote: CRM for notification. See Telephone encounter for: 07/17/18.  Pt's daughter, [name], calling:  States that pt has been weaning off of the carbamazepine (TEGRETOL) 200 MG tablet in preparation for her third gamma knife procedure.  They want to report that last Tuesday was her last dose of the medication.

## 2018-07-17 NOTE — Telephone Encounter (Signed)
Call pt Please notice daughter that mclean out of office  It appears that patient follows with [facility] neurology Dr Salomon Fick for this medication/procedure  Please ensure she has f/u scheduled with [facility]

## 2018-07-17 NOTE — Telephone Encounter (Signed)
FYI

## 2018-07-18 ENCOUNTER — Telehealth: Payer: Self-pay

## 2018-07-18 NOTE — Telephone Encounter (Signed)
Patient and family member aware of dose change and when to recheck

## 2018-07-18 NOTE — Telephone Encounter (Addendum)
INR on 07/17/18: 2.1  Last INR on 07/10/18: 1.9  Current dose: 16 mg M/W/F/Sunday and 15 mg all other days.   Placed paper on your desk.

## 2018-07-18 NOTE — Telephone Encounter (Signed)
Dose just adjusted last week.  INR now 2.1.  Continue same coumadin dose and recheck pt/inr as scheduled.  (it looks like she is getting pt/inr every week).

## 2018-07-19 NOTE — Telephone Encounter (Signed)
Jessica Erickson was informed and she stated she understands.

## 2018-07-22 ENCOUNTER — Ambulatory Visit: Payer: Medicare Other | Admitting: Family Medicine

## 2018-07-22 ENCOUNTER — Encounter: Payer: Self-pay | Admitting: Family Medicine

## 2018-07-22 DIAGNOSIS — K5900 Constipation, unspecified: Secondary | ICD-10-CM

## 2018-07-22 MED ORDER — POLYETHYLENE GLYCOL 3350 17 GM/SCOOP PO POWD: 17.0000 g | Freq: Every day | ORAL | 11 refills | Status: DC

## 2018-07-22 MED ORDER — DOCUSATE SODIUM 100 MG PO CAPS: 100.0000 mg | ORAL_CAPSULE | Freq: Two times a day (BID) | ORAL | 3 refills | Status: DC

## 2018-07-22 NOTE — Progress Notes (Signed)
Subjective:    Patient ID: Jessica Erickson, female    DOB: June 29, 1926, 84 y.o.   MRN: [id number]  HPI  Patient presents to clinic due to constipation. States she is having BMs but the stools are very hard. Last BM was earlier today.  She was not taking miralax every day - only on a PRN basis.  For the past 4 days she has started miralax every day and has used colace as needed.   She also has senna S and Dulcolax as needed on her medication list, but caregiver with patient states she has not used these medications in a while.  Denies any nausea or vomiting.  Denies diarrhea.  No fever chills.  Patient Active Problem List   Diagnosis Date Noted  . Bilateral leg edema 06/04/2018  . Hearing loss 06/04/2018  . Actinic keratosis 06/04/2018  . Bronchiectasis ([place]) 06/04/2018  . Depression 05/17/2018  . Dehydration 05/15/2018  . Hypertension 05/03/2018  . Lymphedema 05/02/2018  . Iron deficiency anemia 05/02/2018  . Physical deconditioning 05/02/2018  . Drug interaction 05/02/2018  . Supratherapeutic INR 05/02/2018  . Presence of IVC filter 05/02/2018  . Anemia 04/11/2018  . Fatigue 04/11/2018  . Acute deep vein thrombosis (DVT) of proximal vein of both lower extremities ([place]) 04/11/2018  . HCAP (healthcare-associated pneumonia) 04/04/2018  . Moderate protein-calorie malnutrition ([place]) 03/22/2018  . Orthostasis 03/05/2018  . Dizziness 03/04/2018  . COPD (chronic obstructive pulmonary disease) ([place]) 12/13/2017  . Anxiety and depression 12/13/2017  . Trigeminal neuralgia 12/13/2017  . GERD (gastroesophageal reflux disease) 12/13/2017  . Diabetes mellitus, type 2 ([place]) 12/13/2017  . Constipation 12/13/2017  . Insomnia 12/13/2017  . Hypothyroidism 12/13/2017  . UTI (urinary tract infection) 10/08/2015  . Collapsed vertebra, not elsewhere classified, thoracic region, initial encounter for fracture ([place]) 09/20/2015  . Basal cell carcinoma of scalp 04/06/2014  . Fothergill's  neuralgia 08/05/2013  . Bladder infection, chronic 02/11/2013  . Female genuine stress incontinence 02/11/2013  . Incomplete bladder emptying 02/11/2013  . Intrinsic sphincter deficiency 02/11/2013  . Mixed incontinence 02/11/2013  . Excessive urination at night 02/11/2013  . Bladder retention 02/11/2013  . FOM (frequency of micturition) 02/11/2013  . Basal cell carcinoma of face 09/13/2011   Social History   Tobacco Use  . Smoking status: Former Smoker    Packs/day: 0.50    Years: 20.00    Pack years: 10.00    Types: Cigarettes    Last attempt to quit: 09/20/1995    Years since quitting: 22.8  . Smokeless tobacco: Never Used  . Tobacco comment: quit 1996 smoked 20 years max 8 cig qd   Substance Use Topics  . Alcohol use: No   Review of Systems  Constitutional: Negative for chills, fatigue and fever.  HENT: Negative for congestion, ear pain, sinus pain and sore throat.   Eyes: Negative.   Respiratory: Negative for cough, shortness of breath and wheezing.   Cardiovascular: Negative for chest pain, palpitations and leg swelling.  Gastrointestinal: +hard stools, constipation. Negative for abdominal pain, diarrhea, nausea and vomiting.  Genitourinary: Negative for dysuria, frequency and urgency.  Musculoskeletal: Negative for arthralgias and myalgias.  Skin: Negative for color change, pallor and rash.  Neurological: Negative for syncope, light-headedness and headaches.  Psychiatric/Behavioral: The patient is not nervous/anxious.    Objective:   Physical Exam  Constitutional: She is oriented to person, place, and time. She appears well-developed and well-nourished.  Non-toxic appearance. She does not appear ill. No distress.  Walks with  4 wheeled walker  HENT:  Head: Normocephalic and atraumatic.  Cardiovascular: Normal rate, regular rhythm and normal heart sounds.  Pulmonary/Chest: Effort normal and breath sounds normal. No respiratory distress.  Abdominal: Soft. Normal  appearance and bowel sounds are normal. There is no tenderness.  Neurological: She is alert and oriented to person, place, and time.  Skin: Skin is warm and dry. No pallor.  Psychiatric: She has a normal mood and affect. Her behavior is normal.  Nursing note and vitals reviewed.    Vitals:   07/22/18 1534  BP: 118/76  Pulse: 73  Temp: 99.8 F (37.7 C)  SpO2: 93%     Assessment & Plan:    Constipation --patient will begin MiraLAX 1 scoop every day, and take Colace 100 mg twice daily on a regular basis.  She may continue to use Dulcolax as needed for moderate to severe constipation.  Keep up good water intake and good fiber intake with different fruits and vegetables.  Keep regular follow-up appointment as scheduled in September 2019.

## 2018-07-22 NOTE — Patient Instructions (Signed)
Take miralax 1 scoop every day  Take colace 100mg  twice daily

## 2018-07-26 LAB — PROTIME-INR: INR: 2.9 — AB (ref 0.9–1.1)

## 2018-07-30 ENCOUNTER — Inpatient Hospital Stay: Payer: Medicare Other | Attending: Oncology

## 2018-07-30 ENCOUNTER — Other Ambulatory Visit: Payer: Self-pay

## 2018-07-30 ENCOUNTER — Inpatient Hospital Stay (HOSPITAL_BASED_OUTPATIENT_CLINIC_OR_DEPARTMENT_OTHER): Payer: Medicare Other | Admitting: Oncology

## 2018-07-30 ENCOUNTER — Encounter: Payer: Self-pay | Admitting: Oncology

## 2018-07-30 VITALS — BP 123/66 | HR 69 | Temp 97.4°F | Resp 18 | Wt 186.6 lb

## 2018-07-30 DIAGNOSIS — D649 Anemia, unspecified: Secondary | ICD-10-CM | POA: Insufficient documentation

## 2018-07-30 DIAGNOSIS — Z86718 Personal history of other venous thrombosis and embolism: Secondary | ICD-10-CM | POA: Diagnosis not present

## 2018-07-30 DIAGNOSIS — Z7901 Long term (current) use of anticoagulants: Secondary | ICD-10-CM | POA: Insufficient documentation

## 2018-07-30 DIAGNOSIS — E119 Type 2 diabetes mellitus without complications: Secondary | ICD-10-CM

## 2018-07-30 DIAGNOSIS — I825Y3 Chronic embolism and thrombosis of unspecified deep veins of proximal lower extremity, bilateral: Secondary | ICD-10-CM

## 2018-07-30 DIAGNOSIS — I10 Essential (primary) hypertension: Secondary | ICD-10-CM

## 2018-07-30 DIAGNOSIS — I824Y3 Acute embolism and thrombosis of unspecified deep veins of proximal lower extremity, bilateral: Secondary | ICD-10-CM

## 2018-07-30 DIAGNOSIS — Z86711 Personal history of pulmonary embolism: Secondary | ICD-10-CM | POA: Insufficient documentation

## 2018-07-30 DIAGNOSIS — Z87891 Personal history of nicotine dependence: Secondary | ICD-10-CM

## 2018-07-30 LAB — CBC WITH DIFFERENTIAL/PLATELET
Basophils Absolute: 0 10*3/uL (ref 0–0.1)
Basophils Relative: 1 %
Eosinophils Absolute: 0.1 10*3/uL (ref 0–0.7)
Eosinophils Relative: 2 %
HCT: 35.7 % (ref 35.0–47.0)
Hemoglobin: 11.5 g/dL — ABNORMAL LOW (ref 12.0–16.0)
Lymphocytes Relative: 34 %
Lymphs Abs: 1.6 10*3/uL (ref 1.0–3.6)
MCH: 27.2 pg (ref 26.0–34.0)
MCHC: 32.4 g/dL (ref 32.0–36.0)
MCV: 84.1 fL (ref 80.0–100.0)
Monocytes Absolute: 0.6 10*3/uL (ref 0.2–0.9)
Monocytes Relative: 12 %
Neutro Abs: 2.6 10*3/uL (ref 1.4–6.5)
Neutrophils Relative %: 51 %
Platelets: 206 10*3/uL (ref 150–440)
RBC: 4.24 MIL/uL (ref 3.80–5.20)
RDW: 18.3 % — ABNORMAL HIGH (ref 11.5–14.5)
WBC: 4.9 10*3/uL (ref 3.6–11.0)

## 2018-07-30 NOTE — Progress Notes (Signed)
Hematology/Oncology follow up note [facility] [phone] [phone]   Patient Care Team: McLean-Scocuzza, Nino Glow, MD as PCP - General (Internal Medicine)  REFERRING PROVIDER: McLean-Scocuzza, Nino Glow, MD REASON FOR VISIT Follow up for treatment of DVT  HISTORY OF PRESENTING ILLNESS:  Jessica Erickson is a  84 y.o.  female with PMH listed below who was referred to me for evaluation of DVT and chronic anticoagulation.  She has hard hearing and most history was obtained from her son and caregiver who accompanied her to clinic today.   Patient had a history of provoked right femoral and popliteal DVT and PE in 2016 after an episode of fall and back procedure. S/p IVC filter placed. She was on coumadin (19mg  daily per pcp) for anticoagulation for a period of time and eventually came off.  IVC filter was left in after a discussion between Dr.Dew and family, given her advanced age and multiple comorbidities.   Patient presented to ER for evaluation of a week of worsening LE swelling and pain.  03/29/2018 US showed extensive bilateral lower extremity DVT.  She was restarted on anticoagulation. Currently on Lovenox bridge to coumadin, per son with INR goal between 2.5-3.   04/04/2018 she had right upper extremity pain and swelling. US showed a subcutaneous soft tissue hematoma   INTERVAL HISTORY Jessica Erickson is a 84 y.o. female who has above history reviewed by me today presents for follow-up of DVT, on chronic anticoagulation.    Patient was accompanied by caregiver.  Patient walks to the clinic with a walker.  She reports doing well at her baseline.  Currently she is on Coumadin, dosing was managed by primary care doctor. Denies easy bruising, epistaxis, hematemesis or hematochezia. Appetite is good. Patient INR appears therapeutic and is stable.  Chronic lower extremity swelling has improved.    Review of Systems  Constitutional: Negative  for chills, fever, malaise/fatigue and weight loss.  HENT: Negative for congestion, ear discharge, ear pain, hearing loss, nosebleeds, sinus pain and sore throat.   Eyes: Negative for double vision, photophobia, pain, discharge and redness.  Respiratory: Negative for cough, hemoptysis, sputum production, shortness of breath and wheezing.   Cardiovascular: Negative for chest pain, palpitations, orthopnea, claudication and leg swelling.  Gastrointestinal: Negative for abdominal pain, blood in stool, heartburn, melena, nausea and vomiting.  Genitourinary: Negative for dysuria, flank pain, frequency and hematuria.  Musculoskeletal: Negative for back pain, myalgias and neck pain.  Skin: Negative for itching and rash.  Neurological: Negative for dizziness, tingling, tremors, focal weakness and headaches.  Endo/Heme/Allergies: Negative for environmental allergies. Does not bruise/bleed easily.  Psychiatric/Behavioral: Negative for depression and hallucinations. The patient is not nervous/anxious.     MEDICAL HISTORY:  Past Medical History:  Diagnosis Date  . Acoustic neuroma ([place])   . Allergy   . Asthma   . Bilateral swelling of feet    and legs  . Bladder infection   . CAD (coronary artery disease)   . Cataract   . Change in voice   . Compression fracture of body of thoracic vertebra (HCC)    T12 09/18/15 MRI s/p fall   . Constipation   . COPD (chronic obstructive pulmonary disease) ([place])    previous CXR with chronic interstitial lung dz   . CVA (cerebral vascular accident) ([place])   . Depression   . Diabetes ([place])    with neuropathy  . Diabetes mellitus, type 2 ([place])   . Diarrhea   .  Double vision   . DVT (deep venous thrombosis) ([place])    right leg 10/2015 was on coumadin off as of 2017/2018 ; s/p IVC filter  . Enuresis   . Eye pain, right   . Fall   . Fatty liver    09/15/15 also mildly dilated pancreatitic duct rec MRCP small sub cm cyst hemangioma speeln mild right  hydronephrorossi and prox. hydroureter, kidney stones, mild scarring kidneys  . Female stress incontinence   . Flank pain   . GERD (gastroesophageal reflux disease)    with small hiatal hernia   . Hard of hearing   . Heart disease   . History of kidney problems   . Hyperlipidemia    mixed  . Hypertension   . Hypothyroidism, postsurgical   . Impaired mobility and ADLs    uses rolling walker has caretaker 24/7 at home  . Leg edema   . Mixed incontinence urge and stress (female)(female)   . Neuropathy   . Osteoarthritis    DDD spine   . Osteoporosis with fracture    T12 compression fracture  . Photophobia   . Pulmonary embolism ([place])    10/2015 off coumadin as of 04/2016  . Pulmonary HTN (HCC)    mild pulm HTN, echo 10/09/15 EF 35-70%VXBLT 1 dd, RV systolic pressure increased   . Recurrent UTI   . Sinus pressure   . Skin cancer    BCC jawline and scalp   . Thyroid disease    follows [facility]  . TIA (transient ischemic attack)    MRI 2009/2010 neg stroke   . Trigeminal neuralgia    Dr. Tomi Bamberger s/p gamma knife x 2, on Tegretol since 2011/2012 no increase in dose >200 mg bid rec per family per neurology   . Urinary frequency   . Urinary, incontinence, stress female    Dr Erlene Quan urology     SURGICAL HISTORY: Past Surgical History:  Procedure Laterality Date  . APPENDECTOMY     as a child, open  . BRAIN SURGERY     schwnnoma removal 1996   . brain tumor surgery    . BREAST SURGERY     breast bx  . CATARACT EXTRACTION    . CHOLECYSTECTOMY    . EYE SURGERY     cataract  . IVC FILTER PLACEMENT ([facility] HX)     Dr. Lucky Cowboy 10/2015   . LAPAROSCOPIC TUBAL LIGATION    . MOHS SURGERY     scalp 04/2014   . PERIPHERAL VASCULAR CATHETERIZATION N/A 10/11/2015   Procedure: IVC Filter Insertion;  Surgeon: Algernon Huxley, MD;  Location: [facility];  Service: Cardiovascular;  Laterality: N/A;  . PERIPHERAL VASCULAR THROMBECTOMY Bilateral 03/29/2018   Procedure: PERIPHERAL  VASCULAR THROMBECTOMY;  Surgeon: Algernon Huxley, MD;  Location: [facility];  Service: Cardiovascular;  Laterality: Bilateral;  . PUBOVAGINAL SLING    . THROAT SURGERY    . THYROID SURGERY     tumor around vocal cords   . TOOTH EXTRACTION     winter 2018   . TOTAL THYROIDECTOMY  1976    SOCIAL HISTORY: Social History   Socioeconomic History  . Marital status: Widowed    Spouse name: Not on file  . Number of children: 5  . Years of education: Not on file  . Highest education level: Not on file  Occupational History  . Occupation: retired  Scientific laboratory technician  . Financial resource strain: Not on file  .  Food insecurity:    Worry: Not on file    Inability: Not on file  . Transportation needs:    Medical: Not on file    Non-medical: Not on file  Tobacco Use  . Smoking status: Former Smoker    Packs/day: 0.50    Years: 20.00    Pack years: 10.00    Types: Cigarettes    Last attempt to quit: 09/20/1995    Years since quitting: 22.8  . Smokeless tobacco: Never Used  . Tobacco comment: quit 1996 smoked 20 years max 8 cig qd   Substance and Sexual Activity  . Alcohol use: No  . Drug use: No  . Sexual activity: Not on file  Lifestyle  . Physical activity:    Days per week: Not on file    Minutes per session: Not on file  . Stress: Not on file  Relationships  . Social connections:    Talks on phone: Not on file    Gets together: Not on file    Attends religious service: Not on file    Active member of club or organization: Not on file    Attends meetings of clubs or organizations: Not on file    Relationship status: Not on file  . Intimate partner violence:    Fear of current or ex partner: Not on file    Emotionally abused: Not on file    Physically abused: Not on file    Forced sexual activity: Not on file  Other Topics Concern  . Not on file  Social History Narrative   Lives at home with caretaker        FAMILY HISTORY: Family History  Problem Relation  Age of Onset  . Heart disease Mother   . Diabetes Father   . Cancer Daughter        breast ca x 2 s/p mastectomy     ALLERGIES:  is allergic to penicillins; sulfa antibiotics; amitiza [lubiprostone]; aspirin; and penicillin g.  MEDICATIONS:  Current Outpatient Medications  Medication Sig Dispense Refill  . acetaminophen (TYLENOL) 325 MG tablet Take 650 mg by mouth every 6 (six) hours as needed.    Marland Kitchen albuterol (PROVENTIL) (2.5 MG/3ML) 0.083% nebulizer solution Take 2.5 mg by nebulization 3 (three) times daily.    . AMBULATORY NON FORMULARY MEDICATION Medication Name: Please dispense flutter valve to use three times daily after nebulization. DX:J47.9 1 each 0  . atorvastatin (LIPITOR) 40 MG tablet Take 1 tablet (40 mg total) by mouth daily at 6 PM. Generic ok 90 tablet 3  . bisacodyl (DULCOLAX) 5 MG EC tablet Take 1 tablet (5 mg total) by mouth daily as needed for moderate constipation. 30 tablet 5  . budesonide-formoterol (SYMBICORT) 160-4.5 MCG/ACT inhaler Inhale 2 puffs into the lungs 2 (two) times daily. Rinse mouth 1 Inhaler 11  . Calcium Carbonate-Vitamin D (CALCIUM 600+D) 600-400 MG-UNIT tablet Take 1 tablet by mouth 2 (two) times daily. Lunch and dinner 90 tablet 3  . cetirizine (ZYRTEC) 10 MG tablet Take 10 mg by mouth daily.    Marland Kitchen docusate sodium (COLACE) 100 MG capsule Take 1 capsule (100 mg total) by mouth 2 (two) times daily. 180 capsule 3  . fluticasone (FLONASE) 50 MCG/ACT nasal spray Place 1-2 sprays into both nostrils daily. Max 2 sprays 16 g 11  . furosemide (LASIX) 40 MG tablet Take 1 tablet (40 mg total) by mouth daily. 30 tablet 2  . gabapentin (NEURONTIN) 100 MG capsule Take 1 capsule (100  mg total) by mouth 2 (two) times daily. 180 capsule 1  . guaiFENesin (MUCINEX) 600 MG 12 hr tablet Take 600 mg by mouth daily.     Marland Kitchen levothyroxine (SYNTHROID, LEVOTHROID) 200 MCG tablet Take 1 tablet (200 mcg total) by mouth daily before breakfast. Except on Sunday. Do not take with  other medications or vitamins 90 tablet 1  . Melatonin 5 MG TABS Take by mouth.    . metFORMIN (GLUCOPHAGE-XR) 500 MG 24 hr tablet Take 1 tablet (500 mg total) by mouth daily with breakfast. 90 tablet 3  . mirtazapine (REMERON) 15 MG tablet Take 1 tablet (15 mg total) by mouth at bedtime. 30 tablet 2  . Multiple Vitamin (MULTIVITAMIN WITH MINERALS) TABS tablet Take 1 tablet by mouth daily.    . ondansetron (ZOFRAN) 4 MG tablet Take 1 tablet (4 mg total) by mouth every 8 (eight) hours as needed for nausea or vomiting. 30 tablet 1  . pantoprazole (PROTONIX) 40 MG tablet Take 1 tablet (40 mg total) by mouth daily. 30 minutes before lunch or dinner 90 tablet 3  . polyethylene glycol powder (GLYCOLAX/MIRALAX) powder Take 17 g by mouth daily. 255 g 11  . polyvinyl alcohol (LIQUIFILM TEARS) 1.4 % ophthalmic solution Place 1 drop into both eyes as needed for dry eyes. 15 mL 11  . senna-docusate (SENOKOT-S) 8.6-50 MG tablet Take 1 tablet by mouth at bedtime as needed for mild constipation.    Marland Kitchen tiotropium (SPIRIVA) 18 MCG inhalation capsule Place 1 capsule (18 mcg total) into inhaler and inhale daily. 90 capsule 3  . warfarin (COUMADIN) 1 MG tablet Take 16 mg on MWFSunday other days 15 mg total 120 tablet 3  . warfarin (COUMADIN) 10 MG tablet Total 15 mg qpm all days except MWFSunday take 16 mg (5mg + 10 mg +1 mg) 90 tablet 3  . warfarin (COUMADIN) 5 MG tablet Take with 15mg  qd (10+5 mg qd) 90 tablet 3  . carbamazepine (TEGRETOL) 200 MG tablet Take 1 tablet (200 mg total) by mouth 2 (two) times daily. (Patient not taking: Reported on 07/22/2018) 180 tablet 3   No current facility-administered medications for this visit.      PHYSICAL EXAMINATION: ECOG PERFORMANCE STATUS: 1 - Symptomatic but completely ambulatory Vitals:   07/30/18 1029  BP: 123/66  Pulse: 69  Resp: 18  Temp: (!) 97.4 F (36.3 C)   Filed Weights   07/30/18 1029  Weight: 186 lb 9.6 oz (84.6 kg)    Physical Exam    Constitutional: She is oriented to person, place, and time. She appears well-developed and well-nourished. No distress.  Frail appearance elderly female, walks with walker  HENT:  Head: Normocephalic and atraumatic.  Right Ear: External ear normal.  Left Ear: External ear normal.  Mouth/Throat: Oropharynx is clear and moist.  Eyes: Pupils are equal, round, and reactive to light. Conjunctivae and EOM are normal. No scleral icterus.  pale  Neck: Normal range of motion. Neck supple.  Cardiovascular: Normal rate.  Pulmonary/Chest: Effort normal and breath sounds normal. No respiratory distress. She has no wheezes. She has no rales. She exhibits no tenderness.  Abdominal: Soft. Bowel sounds are normal. She exhibits no distension and no mass. There is no tenderness.  Musculoskeletal: Normal range of motion. She exhibits no edema or deformity.  Lymphadenopathy:    She has no cervical adenopathy.  Neurological: She is alert and oriented to person, place, and time. No cranial nerve deficit. Coordination normal.  Skin: Skin is warm and  dry. No rash noted. No erythema.  Psychiatric: She has a normal mood and affect. Her behavior is normal. Thought content normal.     LABORATORY DATA:  I have reviewed the data as listed Lab Results  Component Value Date   WBC 4.9 07/30/2018   HGB 11.5 (L) 07/30/2018   HCT 35.7 07/30/2018   MCV 84.1 07/30/2018   PLT 206 07/30/2018   Recent Labs    03/15/18 0528 03/27/18 1815  04/03/18 2149 04/06/18 0528 05/15/18 1116 05/22/18 1114  NA 137 139   < > 137 140 139 138  K 4.5 4.0   < > 3.9 3.7 4.1 3.8  CL 102 100*   < > 100* 106 103 102  CO2 27 32   < > 29 28 26 27   GLUCOSE 105* 113*   < > 133* 119* 126* 113*  BUN 14 20   < > 18 11 15 12   CREATININE 1.03* 1.15*   < > 1.05* 0.85 1.05* 1.00  CALCIUM 8.4* 8.5*   < > 7.9* 7.4* 8.7* 8.3*  GFRNONAA 47* 41*   < > 46* 59* 45* 48*  GFRAA 54* 47*   < > 53* >60 53* 56*  PROT 6.1* 6.9  --  5.8*  --   --   --    ALBUMIN 2.5* 3.4*  --  2.6*  --   --   --   AST 29 25  --  36  --   --   --   ALT 29 13*  --  19  --   --   --   ALKPHOS 78 81  --  69  --   --   --   BILITOT 0.4 0.3  --  0.4  --   --   --    < > = values in this interval not displayed.      ASSESSMENT & PLAN:  1. Anemia, unspecified type   2. Chronic deep vein thrombosis (DVT) of proximal vein of both lower extremities (HCC)    #History of recurrent DVT, on chronic anticoagulation with Coumadin. Her lower extremity swelling symptom has significantly improved. INR has been stable. Discussed with patient and the caregiver.  Patient is aware about bleeding risk given her advanced age.  Recommend continue follow-up with primary care physician for Coumadin dosing with INR. She understands that if she falls, she needs to seek medical advice immediately for assessment of acute bleeding.  # Anemia: Hemoglobin stable at 11.5 acceptable for patient's age group. Return visit 6 months. Total face to face encounter time for this patient visit was 37min. >50% of the time was  spent in counseling and coordination of care.  Earlie Server, MD, PhD Hematology Oncology [facility] Pager- [phone] 07/30/2018

## 2018-08-01 ENCOUNTER — Other Ambulatory Visit: Payer: Self-pay | Admitting: Internal Medicine

## 2018-08-01 ENCOUNTER — Telehealth: Payer: Self-pay | Admitting: Internal Medicine

## 2018-08-01 DIAGNOSIS — I824Y3 Acute embolism and thrombosis of unspecified deep veins of proximal lower extremity, bilateral: Secondary | ICD-10-CM

## 2018-08-01 DIAGNOSIS — Z7901 Long term (current) use of anticoagulants: Principal | ICD-10-CM

## 2018-08-01 DIAGNOSIS — Z5181 Encounter for therapeutic drug level monitoring: Secondary | ICD-10-CM

## 2018-08-01 MED ORDER — WARFARIN SODIUM 10 MG PO TABS: ORAL_TABLET | ORAL | 3 refills | Status: DC

## 2018-08-01 MED ORDER — WARFARIN SODIUM 1 MG PO TABS: ORAL_TABLET | ORAL | 3 refills | Status: DC

## 2018-08-01 MED ORDER — WARFARIN SODIUM 5 MG PO TABS: ORAL_TABLET | ORAL | 3 refills | Status: DC

## 2018-08-01 NOTE — Telephone Encounter (Signed)
Call pt  hold coumadin dose 08/02/18  Take 16 mg MWF and other days 15 mg  INR 3.6 goal is 2-3   [place]

## 2018-08-01 NOTE — Telephone Encounter (Signed)
Note    Call pt  hold coumadin dose 08/02/18  Take 16 mg MWF and other days 15 mg  INR 3.6 goal is 2-3   [place]

## 2018-08-01 NOTE — Telephone Encounter (Signed)
[facility] called patient INR today 3.6, [facility] has faxed over lab.

## 2018-08-02 NOTE — Telephone Encounter (Signed)
Patients son notified and verbalized understanding.

## 2018-08-06 ENCOUNTER — Other Ambulatory Visit: Payer: Self-pay | Admitting: Internal Medicine

## 2018-08-06 DIAGNOSIS — G4700 Insomnia, unspecified: Secondary | ICD-10-CM

## 2018-08-06 DIAGNOSIS — F32A Depression, unspecified: Secondary | ICD-10-CM

## 2018-08-06 DIAGNOSIS — F329 Major depressive disorder, single episode, unspecified: Secondary | ICD-10-CM

## 2018-08-06 MED ORDER — MIRTAZAPINE 15 MG PO TABS: 15.0000 mg | ORAL_TABLET | Freq: Every day | ORAL | 3 refills | Status: DC

## 2018-08-07 ENCOUNTER — Ambulatory Visit: Payer: Medicare Other | Admitting: Podiatry

## 2018-08-07 ENCOUNTER — Encounter: Payer: Self-pay | Admitting: Podiatry

## 2018-08-07 DIAGNOSIS — B351 Tinea unguium: Secondary | ICD-10-CM | POA: Diagnosis not present

## 2018-08-07 DIAGNOSIS — M79676 Pain in unspecified toe(s): Secondary | ICD-10-CM

## 2018-08-07 NOTE — Progress Notes (Signed)
She presents today chief complaint of painful elongated toenails 1 through 5 bilateral.  Objective: Toenails are long thick yellow dystrophic-like mycotic painful palpation.  Assessment: Pain in limb secondary onychomycosis.  Plan: Debridement of toenails 1 through 5 bilateral.

## 2018-08-13 ENCOUNTER — Ambulatory Visit
Admission: RE | Admit: 2018-08-13 | Discharge: 2018-08-13 | Disposition: A | Discharge status: Home / Self Care | Payer: Medicare Other | Source: Ambulatory Visit | Attending: Internal Medicine | Admitting: Internal Medicine

## 2018-08-13 DIAGNOSIS — I7789 Other specified disorders of arteries and arterioles: Secondary | ICD-10-CM | POA: Insufficient documentation

## 2018-08-13 DIAGNOSIS — J189 Pneumonia, unspecified organism: Secondary | ICD-10-CM | POA: Diagnosis present

## 2018-08-13 DIAGNOSIS — I289 Disease of pulmonary vessels, unspecified: Secondary | ICD-10-CM | POA: Diagnosis not present

## 2018-08-13 DIAGNOSIS — J984 Other disorders of lung: Secondary | ICD-10-CM | POA: Insufficient documentation

## 2018-08-13 DIAGNOSIS — J479 Bronchiectasis, uncomplicated: Secondary | ICD-10-CM | POA: Insufficient documentation

## 2018-08-13 DIAGNOSIS — J471 Bronchiectasis with (acute) exacerbation: Secondary | ICD-10-CM | POA: Diagnosis present

## 2018-08-13 DIAGNOSIS — I700 Atherosclerosis of aorta: Secondary | ICD-10-CM | POA: Diagnosis not present

## 2018-08-13 DIAGNOSIS — Z8701 Personal history of pneumonia (recurrent): Secondary | ICD-10-CM | POA: Insufficient documentation

## 2018-08-13 DIAGNOSIS — I2510 Atherosclerotic heart disease of native coronary artery without angina pectoris: Secondary | ICD-10-CM | POA: Insufficient documentation

## 2018-08-15 ENCOUNTER — Telehealth: Payer: Self-pay

## 2018-08-15 ENCOUNTER — Telehealth: Payer: Self-pay | Admitting: Internal Medicine

## 2018-08-15 ENCOUNTER — Other Ambulatory Visit: Payer: Self-pay | Admitting: Internal Medicine

## 2018-08-15 NOTE — Telephone Encounter (Signed)
Renee with [facility] calling to report INR yesterday was 4.0.

## 2018-08-15 NOTE — Telephone Encounter (Signed)
INR 4.0  Mail Coumadin diet information  Also do not take dose today  Change dose to  -15 mg qpm all days except MWF take 15.5 mg (5mg + 10 mg +0.5 mg)  Cut 1 mg pill in 1/2 we are lower the dose on MWF from 16 mg to 15.5   [place]

## 2018-08-15 NOTE — Telephone Encounter (Signed)
INR 4.0  Mail Coumadin diet information  Also do not take dose today  Change dose to  -15 mg qpm all days except MWF take 15.5 mg (5mg + 10 mg +0.5 mg)  Cut 1 mg pill in 1/2 we are lower the dose on MWF from 16 mg to 15.5   [place]

## 2018-08-15 NOTE — Telephone Encounter (Signed)
INR results °

## 2018-08-22 ENCOUNTER — Telehealth: Payer: Self-pay

## 2018-08-22 ENCOUNTER — Telehealth: Payer: Self-pay | Admitting: Internal Medicine

## 2018-08-22 ENCOUNTER — Telehealth: Payer: Self-pay | Admitting: *Deleted

## 2018-08-22 NOTE — Telephone Encounter (Signed)
Left message for patient to return call back. [facility] may give results and obtain information.  

## 2018-08-22 NOTE — Telephone Encounter (Signed)
INR 3.6 Mail Coumadin diet information   Also do not take dose today  Change dose to  -15 mg qpm all days except Mondays and Fridays take15.5mg  (5mg + 10 mg +0.5mg )  Cut 1 mg pill in 1/2 we are lowering the dose on Mondays and Fridays from 16 mg to 15.5   Make sure caretaker clear on this   [place]

## 2018-08-22 NOTE — Telephone Encounter (Signed)
Pt's daughter calling back, pt present during call.  Message from Dr. Faythe Casa read to pt and daughter. Pt reports she already took her 15mg  of coumadin this evening. Daughter verbalizes understanding of weekly  dosage changes, repeated back. Please advise as pt did take Coumadin tonight. [phone]  [phone]

## 2018-08-22 NOTE — Telephone Encounter (Signed)
Copied from [place] [phone]. Topic: General - Other >> Aug 22, 2018 11:04 AM Janace Aris A wrote: Reason for CRM:  MD INR called to report an out of range INR of 3.6.  Call back number [phone]

## 2018-08-22 NOTE — Telephone Encounter (Signed)
Try to reach out to pt again tomorrow if unable to call today  She should hold coumadin dose and resume lower weekly dose tomorrow  Mail coumadin food list   [place]

## 2018-08-23 NOTE — Telephone Encounter (Signed)
FYI

## 2018-08-23 NOTE — Telephone Encounter (Signed)
Addressed previously

## 2018-08-29 ENCOUNTER — Encounter: Payer: Self-pay | Admitting: Internal Medicine

## 2018-08-29 ENCOUNTER — Ambulatory Visit (INDEPENDENT_AMBULATORY_CARE_PROVIDER_SITE_OTHER): Payer: Medicare Other | Admitting: Internal Medicine

## 2018-08-29 VITALS — BP 110/60 | HR 76 | Temp 97.9°F | Ht 67.0 in | Wt 186.6 lb

## 2018-08-29 DIAGNOSIS — Z7901 Long term (current) use of anticoagulants: Secondary | ICD-10-CM | POA: Diagnosis not present

## 2018-08-29 DIAGNOSIS — I10 Essential (primary) hypertension: Secondary | ICD-10-CM | POA: Diagnosis not present

## 2018-08-29 DIAGNOSIS — Z5181 Encounter for therapeutic drug level monitoring: Secondary | ICD-10-CM

## 2018-08-29 DIAGNOSIS — C44310 Basal cell carcinoma of skin of unspecified parts of face: Secondary | ICD-10-CM

## 2018-08-29 DIAGNOSIS — J449 Chronic obstructive pulmonary disease, unspecified: Secondary | ICD-10-CM

## 2018-08-29 DIAGNOSIS — R791 Abnormal coagulation profile: Secondary | ICD-10-CM | POA: Diagnosis not present

## 2018-08-29 DIAGNOSIS — E119 Type 2 diabetes mellitus without complications: Secondary | ICD-10-CM

## 2018-08-29 DIAGNOSIS — Z23 Encounter for immunization: Secondary | ICD-10-CM

## 2018-08-29 DIAGNOSIS — C4441 Basal cell carcinoma of skin of scalp and neck: Secondary | ICD-10-CM

## 2018-08-29 DIAGNOSIS — Z1159 Encounter for screening for other viral diseases: Secondary | ICD-10-CM

## 2018-08-29 DIAGNOSIS — Z0184 Encounter for antibody response examination: Secondary | ICD-10-CM

## 2018-08-29 DIAGNOSIS — I824Y3 Acute embolism and thrombosis of unspecified deep veins of proximal lower extremity, bilateral: Secondary | ICD-10-CM

## 2018-08-29 DIAGNOSIS — J479 Bronchiectasis, uncomplicated: Secondary | ICD-10-CM

## 2018-08-29 LAB — COMPREHENSIVE METABOLIC PANEL
ALT: 18 U/L (ref 0–35)
AST: 20 U/L (ref 0–37)
Albumin: 4 g/dL (ref 3.5–5.2)
Alkaline Phosphatase: 62 U/L (ref 39–117)
BUN: 23 mg/dL (ref 6–23)
CO2: 31 mEq/L (ref 19–32)
Calcium: 9.4 mg/dL (ref 8.4–10.5)
Chloride: 102 mEq/L (ref 96–112)
Creatinine, Ser: 1.29 mg/dL — ABNORMAL HIGH (ref 0.40–1.20)
GFR: 41.23 mL/min — ABNORMAL LOW (ref >60.00)
Glucose, Bld: 84 mg/dL (ref 70–99)
Potassium: 4.3 mEq/L (ref 3.5–5.1)
Sodium: 141 mEq/L (ref 135–145)
Total Bilirubin: 0.5 mg/dL (ref 0.2–1.2)
Total Protein: 7.4 g/dL (ref 6.0–8.3)

## 2018-08-29 LAB — PROTIME-INR
INR: 4.5 ratio — ABNORMAL HIGH (ref 0.8–1.0)
Prothrombin Time: 50.9 s — ABNORMAL HIGH (ref 9.6–13.1)

## 2018-08-29 LAB — HEMOGLOBIN A1C: Hgb A1c MFr Bld: 6.7 % — ABNORMAL HIGH (ref 4.6–6.5)

## 2018-08-29 NOTE — Patient Instructions (Signed)
Flu shot rec get high dose in 1 month  You were given pneumonia shot today   Pneumococcal Polysaccharide Vaccine: What You Need to Know 1. Why get vaccinated? Vaccination can protect older adults (and some children and younger adults) from pneumococcal disease. Pneumococcal disease is caused by bacteria that can spread from person to person through close contact. It can cause ear infections, and it can also lead to more serious infections of the:  Lungs (pneumonia),  Blood (bacteremia), and  Covering of the brain and spinal cord (meningitis). Meningitis can cause deafness and brain damage, and it can be fatal.  Anyone can get pneumococcal disease, but children under 104 years of age, people with certain medical conditions, adults over 2 years of age, and cigarette smokers are at the highest risk. About 18,000 older adults die each year from pneumococcal disease in the [place]. Treatment of pneumococcal infections with penicillin and other drugs used to be more effective. But some strains of the disease have become resistant to these drugs. This makes prevention of the disease, through vaccination, even more important. 2. Pneumococcal polysaccharide vaccine (PPSV23) Pneumococcal polysaccharide vaccine (PPSV23) protects against 23 types of pneumococcal bacteria. It will not prevent all pneumococcal disease. PPSV23 is recommended for:  All adults 16 years of age and older,  Anyone 2 through 82 years of age with certain long-term health problems,  Anyone 2 through 82 years of age with a weakened immune system,  Adults 8 through 82 years of age who smoke cigarettes or have asthma.  Most people need only one dose of PPSV. A second dose is recommended for certain high-risk groups. People 3 and older should get a dose even if they have gotten one or more doses of the vaccine before they turned 65. Your healthcare provider can give you more information about these recommendations. Most  healthy adults develop protection within 2 to 3 weeks of getting the shot. 3. Some people should not get this vaccine  Anyone who has had a life-threatening allergic reaction to PPSV should not get another dose.  Anyone who has a severe allergy to any component of PPSV should not receive it. Tell your provider if you have any severe allergies.  Anyone who is moderately or severely ill when the shot is scheduled may be asked to wait until they recover before getting the vaccine. Someone with a mild illness can usually be vaccinated.  Children less than 31 years of age should not receive this vaccine.  There is no evidence that PPSV is harmful to either a pregnant woman or to her fetus. However, as a precaution, women who need the vaccine should be vaccinated before becoming pregnant, if possible. 4. Risks of a vaccine reaction With any medicine, including vaccines, there is a chance of side effects. These are usually mild and go away on their own, but serious reactions are also possible. About half of people who get PPSV have mild side effects, such as redness or pain where the shot is given, which go away within about two days. Less than 1 out of 100 people develop a fever, muscle aches, or more severe local reactions. Problems that could happen after any vaccine:  People sometimes faint after a medical procedure, including vaccination. Sitting or lying down for about 15 minutes can help prevent fainting, and injuries caused by a fall. Tell your doctor if you feel dizzy, or have vision changes or ringing in the ears.  Some people get severe pain in the  shoulder and have difficulty moving the arm where a shot was given. This happens very rarely.  Any medication can cause a severe allergic reaction. Such reactions from a vaccine are very rare, estimated at about 1 in a million doses, and would happen within a few minutes to a few hours after the vaccination. As with any medicine, there is a very  remote chance of a vaccine causing a serious injury or death. The safety of vaccines is always being monitored. For more information, visit: http://www.aguilar.org/ 5. What if there is a serious reaction? What should I look for? Look for anything that concerns you, such as signs of a severe allergic reaction, very high fever, or unusual behavior. Signs of a severe allergic reaction can include hives, swelling of the face and throat, difficulty breathing, a fast heartbeat, dizziness, and weakness. These would usually start a few minutes to a few hours after the vaccination. What should I do? If you think it is a severe allergic reaction or other emergency that can't wait, call 9-1-1 or get to the nearest hospital. Otherwise, call your doctor. Afterward, the reaction should be reported to the Vaccine Adverse Event Reporting System (VAERS). Your doctor might file this report, or you can do it yourself through the VAERS web site at www.vaers.SamedayNews.es, or by calling 850-180-0603. VAERS does not give medical advice. 6. How can I learn more?  Ask your doctor. He or she can give you the vaccine package insert or suggest other sources of information.  Call your local or state health department.  Contact the Centers for Disease Control and Prevention (CDC): ? Call 313-162-9720 (1-800-CDC-INFO) or ? Visit CDC's website at http://hunter.com/ CDC Pneumococcal Polysaccharide Vaccine VIS (03/27/14) This information is not intended to replace advice given to you by your health care provider. Make sure you discuss any questions you have with your health care provider. Document Released: 09/17/2006 Document Revised: 08/10/2016 Document Reviewed: 08/10/2016 Elsevier Interactive Patient Education  2017 [employer].

## 2018-08-29 NOTE — Progress Notes (Signed)
Pre visit review using our clinic review tool, if applicable. No additional management support is needed unless otherwise documented below in the visit note. 

## 2018-08-29 NOTE — Progress Notes (Signed)
Chief Complaint  Patient presents with  . Follow-up   F/u with caretaker today doing well no complaints 1. HTN controlled off meds ACEI not tolerated due to hypotension 2. DM 2 A1C last 5.9 will repeat today on metformin 500 mg qd  3. DVT b/l on coumadin with variable INR likely 2/2 diet on 15 mg most days on MF 15.5 mg qd  4. H/o skin cancer f/u dermatology 09/02/18 5. CT chest reviewed with bronchiectasis and scarring and mucous plugging on infection    Review of Systems  Constitutional: Negative for weight loss.  HENT: Negative for hearing loss.   Eyes: Negative for blurred vision.  Respiratory: Positive for cough.   Cardiovascular: Positive for leg swelling. Negative for chest pain.  Gastrointestinal: Negative for abdominal pain.  Genitourinary: Negative for dysuria.  Skin: Negative for rash.  Neurological: Negative for headaches.  Psychiatric/Behavioral: Negative for depression.   Past Medical History:  Diagnosis Date  . Acoustic neuroma ([place])   . Allergy   . Asthma   . Bilateral swelling of feet    and legs  . Bladder infection   . CAD (coronary artery disease)   . Cataract   . Change in voice   . Compression fracture of body of thoracic vertebra (HCC)    T12 09/18/15 MRI s/p fall   . Constipation   . COPD (chronic obstructive pulmonary disease) ([place])    previous CXR with chronic interstitial lung dz   . CVA (cerebral vascular accident) ([place])   . Depression   . Diabetes ([place])    with neuropathy  . Diabetes mellitus, type 2 ([place])   . Diarrhea   . Double vision   . DVT (deep venous thrombosis) ([place])    right leg 10/2015 was on coumadin off as of 2017/2018 ; s/p IVC filter  . Enuresis   . Eye pain, right   . Fall   . Fatty liver    09/15/15 also mildly dilated pancreatitic duct rec MRCP small sub cm cyst hemangioma speeln mild right hydronephrorossi and prox. hydroureter, kidney stones, mild scarring kidneys  . Female stress incontinence   . Flank pain   .  GERD (gastroesophageal reflux disease)    with small hiatal hernia   . Hard of hearing   . Heart disease   . History of kidney problems   . Hyperlipidemia    mixed  . Hypertension   . Hypothyroidism, postsurgical   . Impaired mobility and ADLs    uses rolling walker has caretaker 24/7 at home  . Leg edema   . Mixed incontinence urge and stress (female)(female)   . Neuropathy   . Osteoarthritis    DDD spine   . Osteoporosis with fracture    T12 compression fracture  . Photophobia   . Pulmonary embolism ([place])    10/2015 off coumadin as of 04/2016  . Pulmonary HTN (HCC)    mild pulm HTN, echo 10/09/15 EF 42-35%TIRWE 1 dd, RV systolic pressure increased   . Recurrent UTI   . Sinus pressure   . Skin cancer    BCC jawline and scalp   . Thyroid disease    follows [facility]  . TIA (transient ischemic attack)    MRI 2009/2010 neg stroke   . Trigeminal neuralgia    Dr. Tomi Bamberger s/p gamma knife x 2, on Tegretol since 2011/2012 no increase in dose >200 mg bid rec per family per neurology   . Urinary frequency   . Urinary,  incontinence, stress female    Dr Erlene Quan urology    Past Surgical History:  Procedure Laterality Date  . APPENDECTOMY     as a child, open  . BRAIN SURGERY     schwnnoma removal 1996   . brain tumor surgery    . BREAST SURGERY     breast bx  . CATARACT EXTRACTION    . CHOLECYSTECTOMY    . EYE SURGERY     cataract  . IVC FILTER PLACEMENT ([facility] HX)     Dr. Lucky Cowboy 10/2015   . LAPAROSCOPIC TUBAL LIGATION    . MOHS SURGERY     scalp 04/2014   . PERIPHERAL VASCULAR CATHETERIZATION N/A 10/11/2015   Procedure: IVC Filter Insertion;  Surgeon: Algernon Huxley, MD;  Location: [facility];  Service: Cardiovascular;  Laterality: N/A;  . PERIPHERAL VASCULAR THROMBECTOMY Bilateral 03/29/2018   Procedure: PERIPHERAL VASCULAR THROMBECTOMY;  Surgeon: Algernon Huxley, MD;  Location: [facility];  Service: Cardiovascular;  Laterality: Bilateral;  . PUBOVAGINAL  SLING    . THROAT SURGERY    . THYROID SURGERY     tumor around vocal cords   . TOOTH EXTRACTION     winter 2018   . TOTAL THYROIDECTOMY  1976   Family History  Problem Relation Age of Onset  . Heart disease Mother   . Diabetes Father   . Cancer Daughter        breast ca x 2 s/p mastectomy    Social History   Socioeconomic History  . Marital status: Widowed    Spouse name: Not on file  . Number of children: 5  . Years of education: Not on file  . Highest education level: Not on file  Occupational History  . Occupation: retired  Scientific laboratory technician  . Financial resource strain: Not on file  . Food insecurity:    Worry: Not on file    Inability: Not on file  . Transportation needs:    Medical: Not on file    Non-medical: Not on file  Tobacco Use  . Smoking status: Former Smoker    Packs/day: 0.50    Years: 20.00    Pack years: 10.00    Types: Cigarettes    Last attempt to quit: 09/20/1995    Years since quitting: 22.9  . Smokeless tobacco: Never Used  . Tobacco comment: quit 1996 smoked 20 years max 8 cig qd   Substance and Sexual Activity  . Alcohol use: No  . Drug use: No  . Sexual activity: Not on file  Lifestyle  . Physical activity:    Days per week: Not on file    Minutes per session: Not on file  . Stress: Not on file  Relationships  . Social connections:    Talks on phone: Not on file    Gets together: Not on file    Attends religious service: Not on file    Active member of club or organization: Not on file    Attends meetings of clubs or organizations: Not on file    Relationship status: Not on file  . Intimate partner violence:    Fear of current or ex partner: Not on file    Emotionally abused: Not on file    Physically abused: Not on file    Forced sexual activity: Not on file  Other Topics Concern  . Not on file  Social History Narrative   Lives at home with caretaker  Current Meds  Medication Sig  . acetaminophen (TYLENOL) 325 MG  tablet Take 650 mg by mouth every 6 (six) hours as needed.  Marland Kitchen albuterol (PROVENTIL) (2.5 MG/3ML) 0.083% nebulizer solution Take 2.5 mg by nebulization 3 (three) times daily.  . AMBULATORY NON FORMULARY MEDICATION Medication Name: Please dispense flutter valve to use three times daily after nebulization. [id number]:[id number]  . atorvastatin (LIPITOR) 40 MG tablet Take 1 tablet (40 mg total) by mouth daily at 6 PM. Generic ok  . bisacodyl (DULCOLAX) 5 MG EC tablet Take 1 tablet (5 mg total) by mouth daily as needed for moderate constipation.  . budesonide-formoterol (SYMBICORT) 160-4.5 MCG/ACT inhaler Inhale 2 puffs into the lungs 2 (two) times daily. Rinse mouth  . Calcium Carbonate-Vitamin D (CALCIUM 600+D) 600-400 MG-UNIT tablet Take 1 tablet by mouth 2 (two) times daily. Lunch and dinner  . carbamazepine (TEGRETOL) 200 MG tablet Take 1 tablet (200 mg total) by mouth 2 (two) times daily.  . cetirizine (ZYRTEC) 10 MG tablet Take 10 mg by mouth daily.  Marland Kitchen docusate sodium (COLACE) 100 MG capsule Take 1 capsule (100 mg total) by mouth 2 (two) times daily.  . fluticasone (FLONASE) 50 MCG/ACT nasal spray Place 1-2 sprays into both nostrils daily. Max 2 sprays  . furosemide (LASIX) 40 MG tablet Take 1 tablet (40 mg total) by mouth daily.  Marland Kitchen gabapentin (NEURONTIN) 100 MG capsule Take 1 capsule (100 mg total) by mouth 2 (two) times daily.  Marland Kitchen guaiFENesin (MUCINEX) 600 MG 12 hr tablet Take 600 mg by mouth daily.   Marland Kitchen levothyroxine (SYNTHROID, LEVOTHROID) 200 MCG tablet Take 1 tablet (200 mcg total) by mouth daily before breakfast. Except on Sunday. Do not take with other medications or vitamins  . Melatonin 5 MG TABS Take by mouth.  . metFORMIN (GLUCOPHAGE-XR) 500 MG 24 hr tablet Take 1 tablet (500 mg total) by mouth daily with breakfast.  . mirtazapine (REMERON) 15 MG tablet Take 1 tablet (15 mg total) by mouth at bedtime.  . Multiple Vitamin (MULTIVITAMIN WITH MINERALS) TABS tablet Take 1 tablet by mouth daily.  .  ondansetron (ZOFRAN) 4 MG tablet Take 1 tablet (4 mg total) by mouth every 8 (eight) hours as needed for nausea or vomiting.  . pantoprazole (PROTONIX) 40 MG tablet Take 1 tablet (40 mg total) by mouth daily. 30 minutes before lunch or dinner  . polyethylene glycol powder (GLYCOLAX/MIRALAX) powder Take 17 g by mouth daily.  . polyvinyl alcohol (LIQUIFILM TEARS) 1.4 % ophthalmic solution Place 1 drop into both eyes as needed for dry eyes.  Marland Kitchen senna-docusate (SENOKOT-S) 8.6-50 MG tablet Take 1 tablet by mouth at bedtime as needed for mild constipation.  Marland Kitchen tiotropium (SPIRIVA) 18 MCG inhalation capsule Place 1 capsule (18 mcg total) into inhaler and inhale daily.  Marland Kitchen warfarin (COUMADIN) 1 MG tablet Take 16 mg on MWF other days 15 mg total  . warfarin (COUMADIN) 10 MG tablet Total 15 mg qpm all days except MWF take 16 mg (18m+ 10 mg +1 mg)  . warfarin (COUMADIN) 5 MG tablet Take with 167mqd (10+5 mg qd) other days   Allergies  Allergen Reactions  . Penicillins Shortness Of Breath, Rash and Other (See Comments)    Has patient had a PCN reaction causing immediate rash, facial/tongue/throat swelling, SOB or lightheadedness with hypotension: Yes Has patient had a PCN reaction causing severe rash involving mucus membranes or skin necrosis: No Has patient had a PCN reaction that required hospitalization No Has patient had  a PCN reaction occurring within the last 10 years: No If all of the above answers are "NO", then may proceed with Cephalosporin use.  . Sulfa Antibiotics Shortness Of Breath, Rash and Other (See Comments)  . Amitiza [Lubiprostone]     N/v/d  . Aspirin Other (See Comments)    Reaction:  Unknown  Other reaction(s): Bleeding (intolerance) Per patient " causes nose to bleed" Can take 81 mg daily without any complications Other reaction(s): "bloody nose"   . Penicillin G Other (See Comments)    Has patient had a PCN reaction causing immediate rash, facial/tongue/throat swelling, SOB or  lightheadedness with hypotension: No Has patient had a PCN reaction causing severe rash involving mucus membranes or skin necrosis: Unknown Has patient had a PCN reaction that required hospitalization: Unknown Has patient had a PCN reaction occurring within the last 10 years: Unknown If all of the above answers are "NO", then may proceed with Cephalosporin use.   Recent Results (from the past 2160 hour(s))  TSH     Status: None   Collection Time: 06/04/18 12:06 PM  Result Value Ref Range   TSH 1.49 0.35 - 4.50 uIU/mL  Urine Culture     Status: None   Collection Time: 06/04/18  3:46 PM  Result Value Ref Range   Urine Culture, Routine Final report    Organism ID, Bacteria Comment     Comment: Mixed urogenital flora 10,000-25,000 colony forming units per mL   Urinalysis, Routine w reflex microscopic     Status: None   Collection Time: 06/04/18  3:46 PM  Result Value Ref Range   Specific Gravity, UA 1.011 1.005 - 1.030   pH, UA 5.0 5.0 - 7.5   Color, UA Yellow Yellow   Appearance Ur Clear Clear   Leukocytes, UA Negative Negative   Protein, UA Negative Negative/Trace   Glucose, UA Negative Negative   Ketones, UA Negative Negative   RBC, UA Negative Negative   Bilirubin, UA Negative Negative   Urobilinogen, Ur 0.2 0.2 - 1.0 mg/dL   Nitrite, UA Negative Negative   Microscopic Examination Comment     Comment: Microscopic not indicated and not performed.  Protime-INR     Status: None   Collection Time: 06/05/18 12:00 AM  Result Value Ref Range   INR  0.9 - 1.1  Protime-INR     Status: Abnormal   Collection Time: 06/12/18 12:00 AM  Result Value Ref Range   INR 2.5 (A) 0.9 - 1.1  Protime-INR     Status: Abnormal   Collection Time: 06/19/18 12:00 AM  Result Value Ref Range   INR 2.1 (A) 0.9 - 1.1  Protime-INR     Status: None   Collection Time: 06/26/18 12:00 AM  Result Value Ref Range   INR  0.9 - 1.1  Protime-INR     Status: Abnormal   Collection Time: 07/17/18 12:00 AM   Result Value Ref Range   INR 2.1 (A) 0.9 - 1.1  Protime-INR     Status: Abnormal   Collection Time: 07/17/18 12:00 AM  Result Value Ref Range   INR 2.1 (A) 0.9 - 1.1  Protime-INR     Status: Abnormal   Collection Time: 07/26/18 12:00 AM  Result Value Ref Range   INR 2.9 (A) 0.9 - 1.1  CBC with Differential/Platelet     Status: Abnormal   Collection Time: 07/30/18 10:10 AM  Result Value Ref Range   WBC 4.9 3.6 - 11.0 K/uL   RBC  4.24 3.80 - 5.20 MIL/uL   Hemoglobin 11.5 (L) 12.0 - 16.0 g/dL   HCT 35.7 35.0 - 47.0 %   MCV 84.1 80.0 - 100.0 fL   MCH 27.2 26.0 - 34.0 pg   MCHC 32.4 32.0 - 36.0 g/dL   RDW 18.3 (H) 11.5 - 14.5 %   Platelets 206 150 - 440 K/uL   Neutrophils Relative % 51 %   Neutro Abs 2.6 1.4 - 6.5 K/uL   Lymphocytes Relative 34 %   Lymphs Abs 1.6 1.0 - 3.6 K/uL   Monocytes Relative 12 %   Monocytes Absolute 0.6 0.2 - 0.9 K/uL   Eosinophils Relative 2 %   Eosinophils Absolute 0.1 0 - 0.7 K/uL   Basophils Relative 1 %   Basophils Absolute 0.0 0 - 0.1 K/uL    Comment: Performed at [facility], [address] [place], [place] [address]   Objective  Body mass index is 29.23 kg/m. Wt Readings from Last 3 Encounters:  08/29/18 186 lb 9.6 oz (84.6 kg)  07/30/18 186 lb 9.6 oz (84.6 kg)  07/22/18 187 lb 12.8 oz (85.2 kg)   Temp Readings from Last 3 Encounters:  08/29/18 97.9 F (36.6 C) (Oral)  07/30/18 (!) 97.4 F (36.3 C) (Tympanic)  07/22/18 99.8 F (37.7 C) (Oral)   BP Readings from Last 3 Encounters:  08/29/18 110/60  07/30/18 123/66  07/22/18 118/76   Pulse Readings from Last 3 Encounters:  08/29/18 76  07/30/18 69  07/22/18 73    Physical Exam  Constitutional: She is oriented to person, place, and time. Vital signs are normal. She appears well-developed and well-nourished. She is cooperative.  HENT:  Head: Normocephalic and atraumatic.  Mouth/Throat: Oropharynx is clear and moist and mucous membranes are normal.  Eyes: Pupils are  equal, round, and reactive to light. Conjunctivae are normal.  Cardiovascular: Normal rate, regular rhythm and normal heart sounds.  Pulmonary/Chest: Effort normal and breath sounds normal.  1+ leg edema b/l  Neurological: She is alert and oriented to person, place, and time. Gait normal.  Skin: Skin is warm, dry and intact.  Psychiatric: She has a normal mood and affect. Her speech is normal and behavior is normal. Judgment and thought content normal. Cognition and memory are normal.  Nursing note and vitals reviewed.   Assessment   1. HTN controlled w/o meds see hpi 2. DM 2  3. DVT b/l  on coumadin and ivc filter 4. H/o skin cancer  5. Bronchiectasis, ? pulm HTN, mucous plugging h/o COPD stable  6. HM Plan   1.  Monitor did not tolerate acei in past 2/2 hypotension  2.  Cont meds  Check CMET, A1C tody  3. On coumadin 15 mg most days and 15.5 mg M and F 4. Derm appt 09/02/18  5. Given copy CT chest  F/u pulm  rec use flutter valve 3x per day and neb 1-3 x per day prn  6.  Flupt wants to wait pna 23 given today  prevnar had 09/20/16 Tdap 08/04/16 shingrixnot had out of stock called pharmacy,? zostavaxif had check MMR status   F/u dermatology appt 09/02/18  Out of age window pap, colonoscopy, mammo DEXA 01/01/08 osteopeniawill make sure on ca 600 bid and vit D 1000 iu qd -Vitamin d 03/15/18 48.1  Provider: Dr. Olivia Mackie McLean-Scocuzza-Internal Medicine

## 2018-08-30 LAB — MEASLES/MUMPS/RUBELLA IMMUNITY
Mumps IgG: >300 AU/mL
Rubella: 24.3 index
Rubeola IgG: >300 AU/mL

## 2018-09-05 ENCOUNTER — Telehealth: Payer: Self-pay

## 2018-09-05 NOTE — Telephone Encounter (Signed)
Call patient no coumadin today hold dose  rec reduce coumadin to 15 mg daily has she been taking this of Monday and Friday 15.5 mg ? If taking 15.5 on M, F rec reduce dose tomorrow to 15 mg daily   INR 4.4 Thanks Stephen

## 2018-09-05 NOTE — Telephone Encounter (Signed)
Patients care giver was informed of results and directions. She will start the 15mg  of coumadin tomorrow

## 2018-09-05 NOTE — Telephone Encounter (Signed)
INR: 4.4 today 09-05-18

## 2018-09-13 ENCOUNTER — Telehealth: Payer: Self-pay | Admitting: Internal Medicine

## 2018-09-13 NOTE — Telephone Encounter (Signed)
Noted RN to call back  See note

## 2018-09-13 NOTE — Telephone Encounter (Signed)
Copied from [place] [id number]. Topic: General - Other >> Sep 13, 2018  9:48 AM Margot Ables wrote: Reason for CRM: calling to report out of range INR 09/12/18 6:02pm - pt was a 4.5 - this was faxed yesterday evening to [phone] - please contact pt to advise on dosing

## 2018-09-13 NOTE — Telephone Encounter (Signed)
INR 4.5  Hold dose tonight  Go from 15 mg daily start tomorrow  To 15 mg daily except Mondays and Fridays 10 mg and check INR in 1 week

## 2018-09-13 NOTE — Telephone Encounter (Signed)
Patients son was informed of results.  Patients son understood and no questions, comments, or concerns at this time.

## 2018-09-19 ENCOUNTER — Ambulatory Visit (INDEPENDENT_AMBULATORY_CARE_PROVIDER_SITE_OTHER): Payer: Medicare Other

## 2018-09-19 ENCOUNTER — Telehealth: Payer: Self-pay | Admitting: Internal Medicine

## 2018-09-19 ENCOUNTER — Telehealth: Payer: Self-pay

## 2018-09-19 VITALS — BP 118/72 | HR 65 | Temp 98.2°F | Resp 18 | Ht 65.5 in | Wt 187.1 lb

## 2018-09-19 DIAGNOSIS — Z23 Encounter for immunization: Secondary | ICD-10-CM | POA: Diagnosis not present

## 2018-09-19 DIAGNOSIS — Z0000 Encounter for general adult medical examination without abnormal findings: Secondary | ICD-10-CM

## 2018-09-19 NOTE — Telephone Encounter (Signed)
INR 3.8 Hold daily dose always in future if INR >3  Change dose to  15 mg daily except Mondays and WEDNESDAYS (new) and Fridays 10 mg and check INR in 1 week   [address]

## 2018-09-19 NOTE — Telephone Encounter (Signed)
PT/INR checked at home today. Results 3.8 Following regimen as directed.

## 2018-09-19 NOTE — Telephone Encounter (Signed)
Copied from [place] [id number]. Topic: General - Other >> Sep 19, 2018  1:26 PM Keene Breath wrote: Reason for CRM: [name] with [facility] INR called to give an out of range INR of 3.8 today.  Please advise.  CB# [phone]

## 2018-09-19 NOTE — Telephone Encounter (Signed)
Sent to PCP please advise

## 2018-09-19 NOTE — Patient Instructions (Addendum)
  Jessica Erickson , Thank you for taking time to come for your Medicare Wellness Visit. I appreciate your ongoing commitment to your health goals. Please review the following plan we discussed and let me know if I can assist you in the future.   Follow up as needed.    Bring a copy of your [place] and/or Living Will to be scanned into chart.  Have a great day!  These are the goals we discussed: Goals    . Maintain Healthy Lifestyle     Monitor PT/INR Stay hydrated Use stationary bike for exercise Healthy diet Stay hydrated       This is a list of the screening recommended for you and due dates:  Health Maintenance  Topic Date Due  . DEXA scan (bone density measurement)  04/22/1993  . Complete foot exam   12/24/2018  . Urine Protein Check  12/24/2018  . Hemoglobin A1C  02/27/2019  . Eye exam for diabetics  05/03/2019  . Tetanus Vaccine  08/04/2026  . Flu Shot  Completed  . Pneumonia vaccines  Completed

## 2018-09-19 NOTE — Progress Notes (Signed)
Subjective:   Jessica Erickson is a 84 y.o. female who presents for an Initial Medicare Annual Wellness Visit.  Review of Systems    No ROS.  Medicare Wellness Visit. Additional risk factors are reflected in the social history.  Cardiac Risk Factors include: advanced age (>66men, >42 women);hypertension;diabetes mellitus     Objective:    Today's Vitals   09/19/18 1059  BP: 118/72  Pulse: 65  Resp: 18  Temp: 98.2 F (36.8 C)  TempSrc: Oral  SpO2: 96%  Weight: 187 lb 1.9 oz (84.9 kg)  Height: 5' 5.5" (1.664 m)   Body mass index is 30.66 kg/m.  Advanced Directives 09/19/2018 07/30/2018 05/22/2018 05/15/2018 04/30/2018 04/06/2018 03/29/2018  Does Patient Have a Medical Advance Directive? Yes Yes Yes Yes Yes Yes -  Type of Advance Directive Living will;Healthcare Power of [place];Living will [place] -  Does patient want to make changes to medical advance directive? No - Patient declined - - - No - Patient declined No - Patient declined -  Copy of [place] in Chart? No - copy requested - - - No - copy requested - -  Would patient like information on creating a medical advance directive? - - - - - - No - Patient declined    Current Medications (verified) Outpatient Encounter Medications as of 09/19/2018  Medication Sig  . acetaminophen (TYLENOL) 325 MG tablet Take 650 mg by mouth every 6 (six) hours as needed.  Marland Kitchen albuterol (PROVENTIL) (2.5 MG/3ML) 0.083% nebulizer solution Take 2.5 mg by nebulization 3 (three) times daily.  . AMBULATORY NON FORMULARY MEDICATION Medication Name: Please dispense flutter valve to use three times daily after nebulization. DX:J47.9  . atorvastatin (LIPITOR) 40 MG tablet Take 1 tablet (40 mg total) by mouth daily at 6 PM. Generic ok  . bisacodyl (DULCOLAX) 5 MG EC tablet Take 1 tablet (5 mg total) by mouth daily as needed for moderate constipation.  . budesonide-formoterol  (SYMBICORT) 160-4.5 MCG/ACT inhaler Inhale 2 puffs into the lungs 2 (two) times daily. Rinse mouth  . Calcium Carbonate-Vitamin D (CALCIUM 600+D) 600-400 MG-UNIT tablet Take 1 tablet by mouth 2 (two) times daily. Lunch and dinner  . cetirizine (ZYRTEC) 10 MG tablet Take 10 mg by mouth daily.  Marland Kitchen docusate sodium (COLACE) 100 MG capsule Take 1 capsule (100 mg total) by mouth 2 (two) times daily.  . fluticasone (FLONASE) 50 MCG/ACT nasal spray Place 1-2 sprays into both nostrils daily. Max 2 sprays  . furosemide (LASIX) 40 MG tablet Take 1 tablet (40 mg total) by mouth daily.  Marland Kitchen gabapentin (NEURONTIN) 100 MG capsule Take 1 capsule (100 mg total) by mouth 2 (two) times daily.  Marland Kitchen guaiFENesin (MUCINEX) 600 MG 12 hr tablet Take 600 mg by mouth daily.   Marland Kitchen levothyroxine (SYNTHROID, LEVOTHROID) 200 MCG tablet Take 1 tablet (200 mcg total) by mouth daily before breakfast. Except on Sunday. Do not take with other medications or vitamins  . Melatonin 10 MG TABS Take by mouth.   . metFORMIN (GLUCOPHAGE-XR) 500 MG 24 hr tablet Take 1 tablet (500 mg total) by mouth daily with breakfast.  . mirtazapine (REMERON) 15 MG tablet Take 1 tablet (15 mg total) by mouth at bedtime.  . Multiple Vitamin (MULTIVITAMIN WITH MINERALS) TABS tablet Take 1 tablet by mouth daily.  . ondansetron (ZOFRAN) 4 MG tablet Take 1 tablet (4 mg total) by mouth every 8 (eight) hours as needed  for nausea or vomiting.  . pantoprazole (PROTONIX) 40 MG tablet Take 1 tablet (40 mg total) by mouth daily. 30 minutes before lunch or dinner  . polyethylene glycol powder (GLYCOLAX/MIRALAX) powder Take 17 g by mouth daily.  . polyvinyl alcohol (LIQUIFILM TEARS) 1.4 % ophthalmic solution Place 1 drop into both eyes as needed for dry eyes.  Marland Kitchen senna-docusate (SENOKOT-S) 8.6-50 MG tablet Take 1 tablet by mouth at bedtime as needed for mild constipation.  Marland Kitchen tiotropium (SPIRIVA) 18 MCG inhalation capsule Place 1 capsule (18 mcg total) into inhaler and inhale  daily.  Marland Kitchen warfarin (COUMADIN) 1 MG tablet Take 16 mg on MWF other days 15 mg total  . warfarin (COUMADIN) 10 MG tablet Total 15 mg qpm all days except MWF take 16 mg (5mg + 10 mg +1 mg)  . warfarin (COUMADIN) 5 MG tablet Take with 15mg  qd (10+5 mg qd) other days  . [DISCONTINUED] carbamazepine (TEGRETOL) 200 MG tablet Take 1 tablet (200 mg total) by mouth 2 (two) times daily.   No facility-administered encounter medications on file as of 09/19/2018.     Allergies (verified) Penicillins; Sulfa antibiotics; Amitiza [lubiprostone]; Aspirin; and Penicillin g   History: Past Medical History:  Diagnosis Date  . Acoustic neuroma ([place])   . Allergy   . Asthma   . Bilateral swelling of feet    and legs  . Bladder infection   . CAD (coronary artery disease)   . Cataract   . Change in voice   . Compression fracture of body of thoracic vertebra (HCC)    T12 09/18/15 MRI s/p fall   . Constipation   . COPD (chronic obstructive pulmonary disease) ([place])    previous CXR with chronic interstitial lung dz   . CVA (cerebral vascular accident) ([place])   . Depression   . Diabetes ([place])    with neuropathy  . Diabetes mellitus, type 2 ([place])   . Diarrhea   . Double vision   . DVT (deep venous thrombosis) ([place])    right leg 10/2015 was on coumadin off as of 2017/2018 ; s/p IVC filter  . Enuresis   . Eye pain, right   . Fall   . Fatty liver    09/15/15 also mildly dilated pancreatitic duct rec MRCP small sub cm cyst hemangioma speeln mild right hydronephrorossi and prox. hydroureter, kidney stones, mild scarring kidneys  . Female stress incontinence   . Flank pain   . GERD (gastroesophageal reflux disease)    with small hiatal hernia   . Hard of hearing   . Heart disease   . History of kidney problems   . Hyperlipidemia    mixed  . Hypertension   . Hypothyroidism, postsurgical   . Impaired mobility and ADLs    uses rolling walker has caretaker 24/7 at home  . Leg edema   . Mixed  incontinence urge and stress (female)(female)   . Neuropathy   . Osteoarthritis    DDD spine   . Osteoporosis with fracture    T12 compression fracture  . Photophobia   . Pulmonary embolism ([place])    10/2015 off coumadin as of 04/2016  . Pulmonary HTN (HCC)    mild pulm HTN, echo 10/09/15 EF 30-86%VHQIO 1 dd, RV systolic pressure increased   . Recurrent UTI   . Sinus pressure   . Skin cancer    BCC jawline and scalp   . Thyroid disease    follows [facility]  . TIA (transient ischemic attack)  MRI 2009/2010 neg stroke   . Trigeminal neuralgia    Dr. Tomi Bamberger s/p gamma knife x 2, on Tegretol since 2011/2012 no increase in dose >200 mg bid rec per family per neurology   . Urinary frequency   . Urinary, incontinence, stress female    Dr Erlene Quan urology    Past Surgical History:  Procedure Laterality Date  . APPENDECTOMY     as a child, open  . BRAIN SURGERY     schwnnoma removal 1996   . brain tumor surgery    . BREAST SURGERY     breast bx  . CATARACT EXTRACTION    . CHOLECYSTECTOMY    . EYE SURGERY     cataract  . IVC FILTER PLACEMENT ([facility] HX)     Dr. Lucky Cowboy 10/2015   . LAPAROSCOPIC TUBAL LIGATION    . MOHS SURGERY     scalp 04/2014   . PERIPHERAL VASCULAR CATHETERIZATION N/A 10/11/2015   Procedure: IVC Filter Insertion;  Surgeon: Algernon Huxley, MD;  Location: [facility];  Service: Cardiovascular;  Laterality: N/A;  . PERIPHERAL VASCULAR THROMBECTOMY Bilateral 03/29/2018   Procedure: PERIPHERAL VASCULAR THROMBECTOMY;  Surgeon: Algernon Huxley, MD;  Location: [facility];  Service: Cardiovascular;  Laterality: Bilateral;  . PUBOVAGINAL SLING    . THROAT SURGERY    . THYROID SURGERY     tumor around vocal cords   . TOOTH EXTRACTION     winter 2018   . TOTAL THYROIDECTOMY  1976   Family History  Problem Relation Age of Onset  . Heart disease Mother   . Diabetes Father   . Cancer Daughter        breast ca x 2 s/p mastectomy    Social History    Socioeconomic History  . Marital status: Widowed    Spouse name: Not on file  . Number of children: 5  . Years of education: Not on file  . Highest education level: Not on file  Occupational History  . Occupation: retired  Scientific laboratory technician  . Financial resource strain: Not hard at all  . Food insecurity:    Worry: Never true    Inability: Never true  . Transportation needs:    Medical: No    Non-medical: No  Tobacco Use  . Smoking status: Former Smoker    Packs/day: 0.50    Years: 20.00    Pack years: 10.00    Types: Cigarettes    Last attempt to quit: 09/20/1995    Years since quitting: 23.0  . Smokeless tobacco: Never Used  . Tobacco comment: quit 1996 smoked 20 years max 8 cig qd   Substance and Sexual Activity  . Alcohol use: No  . Drug use: No  . Sexual activity: Not on file  Lifestyle  . Physical activity:    Days per week: Not on file    Minutes per session: Not on file  . Stress: Not at all  Relationships  . Social connections:    Talks on phone: Not on file    Gets together: Not on file    Attends religious service: Not on file    Active member of club or organization: Not on file    Attends meetings of clubs or organizations: Not on file    Relationship status: Not on file  Other Topics Concern  . Not on file  Social History Narrative   Lives at home with caretaker  Tobacco Counseling Counseling given: Not Answered Comment: quit 1996 smoked 20 years max 8 cig qd    Clinical Intake:  Pre-visit preparation completed: Yes  Pain : No/denies pain     Nutritional Status: BMI 25 -29 Overweight Diabetes: Yes(Followed by pcp)  How often do you need to have someone help you when you read instructions, pamphlets, or other written materials from your doctor or pharmacy?: 3 - Sometimes  Interpreter Needed?: No      Activities of Daily Living In your present state of health, do you have any difficulty performing the following activities:  09/19/2018 04/06/2018  Hearing? Y Y  Comment R ear hearing aid -  Vision? N N  Difficulty concentrating or making decisions? N Y  Walking or climbing stairs? Y Y  Comment Ambulates with walker -  Dressing or bathing? N Y  Doing errands, shopping? Y Y  Comment She does not drive Facilities manager and eating ? Y -  Using the Toilet? N -  In the past six months, have you accidently leaked urine? Y -  Comment Managed with daily liner -  Do you have problems with loss of bowel control? N -  Managing your Medications? Y -  Comment Caretaker assists -  Managing your Finances? Y -  Comment Family assists -  Housekeeping or managing your Housekeeping? Y -  Comment Caretaker assists -  Some recent data might be hidden     Immunizations and Health Maintenance Immunization History  Administered Date(s) Administered  . Influenza, High Dose Seasonal PF 09/19/2018  . Influenza-Unspecified 08/05/2015, 09/02/2017  . Pneumococcal Polysaccharide-23 08/29/2018   Health Maintenance Due  Topic Date Due  . DEXA SCAN  04/22/1993    Patient Care Team: McLean-Scocuzza, Nino Glow, MD as PCP - General (Internal Medicine)  Indicate any recent Medical Services you may have received from other than [facility] providers in the past year (date may be approximate).     Assessment:   This is a routine wellness examination for Tilly. The goal of the wellness visit is to assist the patient how to close the gaps in care and create a preventative care plan for the patient. Presents with caretaker Shirlean Mylar, HIPAA compliant. Information received from both.   States she feels good overall.   Taking calcium VIT D as appropriate/Osteoporosis risk reviewed.    Safety issues reviewed; Smoke and carbon monoxide detectors in the home. No firearms in the home. Wears seatbelts when riding with others. No violence in the home.  They do not have excessive sun exposure.  Discussed the need for sun protection: hats, long  sleeves and the use of sunscreen if there is significant sun exposure.  Patient is alert, normal appearance, oriented to person/place/and time.  Correctly identified the president of the [place]. Can read correct time from watch face.  Displays appropriate judgement.  Caretaker assists with ADL's as needed. Ambulates with walker.   BMI- discussed the importance of a healthy diet, water intake and the benefits of aerobic exercise. She tries to have a healthy diet, adequate water intake and uses the stationary bike for exercise.   24 hour diet recall: Regular diet. Monitors sugar and carbohydrate intake.  Fluids- drinking more water daily as directed.   Dental- every 6 months.  Eye- Visual acuity not assessed per patient preference.  Wears corrective lenses.  Sleep patterns- Sleeps good at night.  Taking melatonin 10mg  as directed.   High dose influenza vaccine administered R deltoid, tolerated well.  Educational material provided.  Dexa Scan discussed.   Patient Concerns: None at this time. Follow up with PCP as needed.  Hearing/Vision screen Hearing Screening Comments: Hard of hearing R hearing aid worn Vision Screening Comments: Follow[facility] by [facility] Wears corrective lenses Annual visits Cataract extraction, bilateral Visual acuity not assessed per patient preference   Dietary issues and exercise activities discussed: Current Exercise Habits: Home exercise routine, Type of exercise: calisthenics, Time (Minutes): 20, Frequency (Times/Week): 3, Weekly Exercise (Minutes/Week): 60, Intensity: Mild  Goals    . Maintain Healthy Lifestyle     Monitor PT/INR Stay hydrated Use stationary bike for exercise Healthy diet Stay hydrated      Depression Screen PHQ 2/9 Scores 09/19/2018 08/29/2018 06/04/2018 05/17/2018 05/02/2018 04/11/2018  PHQ - 2 Score 0 0 2 6 0 0  PHQ- 9 Score - - 4 20 - -    Fall Risk Fall Risk  09/19/2018 08/29/2018 05/17/2018 05/15/2018 05/02/2018  Falls  in the past year? No No No No No   Cognitive Function:     6CIT Screen 09/19/2018  What Year? 0 points  What month? 0 points  What time? 0 points  Count back from 20 0 points  Months in reverse (No Data)    Screening Tests Health Maintenance  Topic Date Due  . DEXA SCAN  04/22/1993  . FOOT EXAM  12/24/2018  . URINE MICROALBUMIN  12/24/2018  . HEMOGLOBIN A1C  02/27/2019  . OPHTHALMOLOGY EXAM  05/03/2019  . TETANUS/TDAP  08/04/2026  . INFLUENZA VACCINE  Completed  . PNA vac Low Risk Adult  Completed     Plan:    End of life planning; Advance aging; Advanced directives discussed. Copy of current HCPOA/Living Will requested.    I have personally reviewed and noted the following in the patient's chart:   . Medical and social history . Use of alcohol, tobacco or illicit drugs  . Current medications and supplements . Functional ability and status . Nutritional status . Physical activity . Advanced directives . List of other physicians . Hospitalizations, surgeries, and ER visits in previous 12 months . Vitals . Screenings to include cognitive, depression, and falls . Referrals and appointments  In addition, I have reviewed and discussed with patient certain preventive protocols, quality metrics, and best practice recommendations. A written personalized care plan for preventive services as well as general preventive health recommendations were provided to patient.     Varney Biles, LPN   [id number]

## 2018-09-19 NOTE — Progress Notes (Signed)
Agree with below   [place]

## 2018-09-20 NOTE — Telephone Encounter (Signed)
Patient caregiver notified and voiced understanding.

## 2018-09-27 ENCOUNTER — Telehealth: Payer: Self-pay | Admitting: *Deleted

## 2018-09-27 NOTE — Telephone Encounter (Signed)
Patients caregiver was given the directions. She understood and had no further questions.

## 2018-09-27 NOTE — Telephone Encounter (Signed)
INR 3.7 Hold daily dose always in future if INR >3  Change dose to   15 mg daily except Mondays and [facility] and Fridays and Sundays (new) 10 mg and check INR in 1 week  [place]

## 2018-09-27 NOTE — Telephone Encounter (Signed)
Copied from [place] [id number]. Topic: General - Other >> Sep 27, 2018  9:39 AM Carolyn Stare wrote: [place] with [place] call to report 3.7 on 09/26/18

## 2018-10-03 ENCOUNTER — Telehealth: Payer: Self-pay | Admitting: Internal Medicine

## 2018-10-03 NOTE — Telephone Encounter (Signed)
INR2.1 10//31/19   Holddailydosealways in future if INR >3   Continue dose  15 mg daily except Mondaysand WEDNESDAYS andFridays and Sundays (new) 10 mg and check INR in 1 week

## 2018-10-09 ENCOUNTER — Ambulatory Visit: Payer: Medicare Other | Admitting: Podiatry

## 2018-10-10 ENCOUNTER — Ambulatory Visit: Payer: Medicare Other | Admitting: Podiatry

## 2018-10-10 ENCOUNTER — Telehealth: Payer: Self-pay | Admitting: Internal Medicine

## 2018-10-10 ENCOUNTER — Encounter: Payer: Self-pay | Admitting: Podiatry

## 2018-10-10 ENCOUNTER — Other Ambulatory Visit: Payer: Self-pay | Admitting: Internal Medicine

## 2018-10-10 DIAGNOSIS — B351 Tinea unguium: Secondary | ICD-10-CM

## 2018-10-10 DIAGNOSIS — R600 Localized edema: Secondary | ICD-10-CM

## 2018-10-10 DIAGNOSIS — E1140 Type 2 diabetes mellitus with diabetic neuropathy, unspecified: Secondary | ICD-10-CM | POA: Diagnosis not present

## 2018-10-10 DIAGNOSIS — M79676 Pain in unspecified toe(s): Secondary | ICD-10-CM | POA: Diagnosis not present

## 2018-10-10 DIAGNOSIS — D689 Coagulation defect, unspecified: Secondary | ICD-10-CM

## 2018-10-10 NOTE — Telephone Encounter (Signed)
INR3.0 10/10/18  Holddailydosealways in future if INR >3   Continue dose  15 mg daily  except Mondaysand WEDNESDAYS andFridaysand Sundays 10 mg  check INR in 1x week  [place]

## 2018-10-10 NOTE — Progress Notes (Signed)
Complaint:  Visit Type: Patient returns to my office for continued preventative foot care services. Complaint: Patient states" my nails have grown long and thick and become painful to walk and wear shoes" Patient has been diagnosed with DM with neuropathy. The patient presents for preventative foot care services. No changes to ROS  Podiatric Exam: Vascular: dorsalis pedis and posterior tibial pulses are palpable bilateral. Capillary return is immediate. Temperature gradient is WNL. Skin turgor WNL  Sensorium: Diminished Semmes Weinstein monofilament test. Normal tactile sensation bilaterally. Nail Exam: Pt has thick disfigured discolored nails with subungual debris noted bilateral entire nail hallux through fifth toenails Ulcer Exam: There is no evidence of ulcer or pre-ulcerative changes or infection. Orthopedic Exam: Muscle tone and strength are WNL. No limitations in general ROM. No crepitus or effusions noted. Foot type and digits show no abnormalities. Bony prominences are unremarkable. Skin: No Porokeratosis. No infection or ulcers  Diagnosis:  Onychomycosis, , Pain in right toe, pain in left toes  Treatment & Plan Procedures and Treatment: Consent by patient was obtained for treatment procedures.   Debridement of mycotic and hypertrophic toenails, 1 through 5 bilateral and clearing of subungual debris. No ulceration, no infection noted.  Return Visit-Office Procedure: Patient instructed to return to the office for a follow up visit 3 months for continued evaluation and treatment.    Gardiner Barefoot DPM

## 2018-10-11 NOTE — Telephone Encounter (Signed)
Spoke to son [name] per [employer] and advised of below

## 2018-10-16 ENCOUNTER — Other Ambulatory Visit: Payer: Self-pay | Admitting: Internal Medicine

## 2018-10-16 DIAGNOSIS — G500 Trigeminal neuralgia: Secondary | ICD-10-CM

## 2018-10-16 MED ORDER — GABAPENTIN 100 MG PO CAPS: 100.0000 mg | ORAL_CAPSULE | Freq: Two times a day (BID) | ORAL | 3 refills | Status: DC

## 2018-10-17 ENCOUNTER — Telehealth: Payer: Self-pay | Admitting: Internal Medicine

## 2018-10-17 NOTE — Telephone Encounter (Signed)
INR 2.3 10/17/18 continue same dose   [place]

## 2018-10-24 ENCOUNTER — Telehealth: Payer: Self-pay | Admitting: Internal Medicine

## 2018-10-24 NOTE — Telephone Encounter (Signed)
Jessica Erickson call report patient INR =3.5 Reported to [facility].

## 2018-10-24 NOTE — Telephone Encounter (Signed)
Call pt rec hold dose of coumadin today and resume next day and check INR in 1 week  Keep same dose for now  Monitor leafy green intake and food list with coumadin foods that could cause INR to fluctuate   [place]

## 2018-10-25 NOTE — Telephone Encounter (Signed)
Caretaker has been informed.

## 2018-10-30 ENCOUNTER — Telehealth: Payer: Self-pay | Admitting: Internal Medicine

## 2018-10-30 ENCOUNTER — Telehealth: Payer: Self-pay

## 2018-10-30 ENCOUNTER — Encounter: Payer: Self-pay | Admitting: Internal Medicine

## 2018-10-30 LAB — POCT INR: INR: 1.5 — AB (ref 2.0–3.0)

## 2018-10-30 NOTE — Telephone Encounter (Signed)
This encounter was created in error - please disregard.

## 2018-10-30 NOTE — Telephone Encounter (Signed)
CRITICAL VALUE STICKER  CRITICAL VALUE: INR 1.5  RECEIVER (on-site recipient of call): Jessica Erickson  DATE & TIME NOTIFIED: 15;58  MESSENGER (representative from lab): Jessica Beards MD/INR   MD NOTIFIED: Dr McLean-Scocuzza  TIME OF NOTIFICATION: 358pm  RESPONSE:

## 2018-10-30 NOTE — Telephone Encounter (Signed)
Melanie  From MD INR called to state INR was, 1.5

## 2018-10-30 NOTE — Telephone Encounter (Signed)
INR 1.5 slightly low Wednesday 10/30/18  Check INR on Monday 11/04/18 and call back to the clinic  Since INR variable high more than low continue same dose for now    [place]

## 2018-11-04 ENCOUNTER — Telehealth: Payer: Self-pay | Admitting: Internal Medicine

## 2018-11-04 ENCOUNTER — Encounter: Payer: Self-pay | Admitting: Internal Medicine

## 2018-11-04 LAB — POCT INR

## 2018-11-04 NOTE — Telephone Encounter (Signed)
'  Jessica Erickson' with [facility] calling to report pt's INR of today 3.4. NT already alerted this am and instructions received. See telephone encounter of 1027am.

## 2018-11-04 NOTE — Telephone Encounter (Signed)
Caretaker has been informed.

## 2018-11-04 NOTE — Telephone Encounter (Signed)
Arbie Cookey (caregiver) called with  INR: 3.4. Called PCP [facility] and spoke with Fransisco Beau. Fransisco Beau stated that Dr McLean-Scocuzza left orders if INR is over 3 to hold her Coumadin. Called Arbie Cookey back and informed her to hold today's dose only. Caregiver verbalized understanding.

## 2018-11-05 ENCOUNTER — Telehealth: Payer: Self-pay | Admitting: Internal Medicine

## 2018-11-05 NOTE — Telephone Encounter (Signed)
INR 3.4 on 11/04/18 and 1.5 on 10/30/18  Continue same dose of coumadin for now  I am ok with INR from 2-3.5 range.   [place]

## 2018-11-06 NOTE — Telephone Encounter (Signed)
Patient has been informed.

## 2018-11-07 ENCOUNTER — Encounter: Payer: Self-pay | Admitting: Internal Medicine

## 2018-11-07 ENCOUNTER — Telehealth: Payer: Self-pay | Admitting: Internal Medicine

## 2018-11-07 NOTE — Telephone Encounter (Signed)
This encounter was created in error - please disregard.

## 2018-11-07 NOTE — Telephone Encounter (Unsigned)
Copied from [place] [id number]. Topic: General - Other >> Nov 07, 2018 10:07 AM Judyann Munson wrote: Reason for CRM:  Arbie Cookey is calling in to advise the patient INR result are 2.5

## 2018-11-07 NOTE — Telephone Encounter (Signed)
INR actually 2.1 when faxed report today there is nothing to advise 2-3 is normal  [place]

## 2018-11-11 ENCOUNTER — Telehealth: Payer: Self-pay

## 2018-11-11 NOTE — Telephone Encounter (Signed)
Copied from [place] [phone]. Topic: General - Other >> Nov 11, 2018  3:04 PM Rutherford Nail, NT wrote: Reason for CRM: Patient's daughter, Wells Guiles, calling and would like to know what Dr Aundra Dubin thinks about the possibility of flying the patient and her caregiver out to [place] after Christmas for a few days? States that it is about a 2 hour flight from [place], [place] to [place]. Would it be too much for her? What are Dr Claris Gladden concerns? Please advise.  CB#: [phone]

## 2018-11-12 ENCOUNTER — Ambulatory Visit (INDEPENDENT_AMBULATORY_CARE_PROVIDER_SITE_OTHER): Payer: Medicare Other | Admitting: Vascular Surgery

## 2018-11-12 ENCOUNTER — Encounter (INDEPENDENT_AMBULATORY_CARE_PROVIDER_SITE_OTHER): Payer: Self-pay | Admitting: Vascular Surgery

## 2018-11-12 VITALS — BP 153/83 | HR 78 | Resp 20 | Ht 67.0 in | Wt 186.0 lb

## 2018-11-12 DIAGNOSIS — Z87891 Personal history of nicotine dependence: Secondary | ICD-10-CM

## 2018-11-12 DIAGNOSIS — I10 Essential (primary) hypertension: Secondary | ICD-10-CM

## 2018-11-12 DIAGNOSIS — E119 Type 2 diabetes mellitus without complications: Secondary | ICD-10-CM | POA: Diagnosis not present

## 2018-11-12 DIAGNOSIS — I82409 Acute embolism and thrombosis of unspecified deep veins of unspecified lower extremity: Secondary | ICD-10-CM | POA: Insufficient documentation

## 2018-11-12 DIAGNOSIS — I890 Lymphedema, not elsewhere classified: Secondary | ICD-10-CM

## 2018-11-12 DIAGNOSIS — I82513 Chronic embolism and thrombosis of femoral vein, bilateral: Secondary | ICD-10-CM

## 2018-11-12 DIAGNOSIS — Z95828 Presence of other vascular implants and grafts: Secondary | ICD-10-CM | POA: Diagnosis not present

## 2018-11-12 DIAGNOSIS — Z86718 Personal history of other venous thrombosis and embolism: Secondary | ICD-10-CM | POA: Insufficient documentation

## 2018-11-12 NOTE — Assessment & Plan Note (Signed)
Well-controlled with elevation.  From chronic scarring and lymphatic channels.  Would recommend compression stockings and elevation.

## 2018-11-12 NOTE — Progress Notes (Signed)
MRN : [id number]  Jessica Erickson is a 84 y.o. (11-20-1926) female who presents with chief complaint of  Chief Complaint  Patient presents with  . Follow-up    6 month f/u  .  History of Present Illness: Patient returns today in follow up of her leg swelling and multiple previous DVTs.  Her leg swelling is actually fairly well controlled with elevation.  She was told by somebody she could not wear compression stockings but I actually urged her to wear them regularly.  No chest pain or shortness of breath.  Walking well.  She does use a walker but gets around just fine.  Current Outpatient Medications  Medication Sig Dispense Refill  . acetaminophen (TYLENOL) 325 MG tablet Take 650 mg by mouth every 6 (six) hours as needed.    Marland Kitchen albuterol (PROVENTIL) (2.5 MG/3ML) 0.083% nebulizer solution Take 2.5 mg by nebulization 3 (three) times daily.    . AMBULATORY NON FORMULARY MEDICATION Medication Name: Please dispense flutter valve to use three times daily after nebulization. DX:J47.9 1 each 0  . atorvastatin (LIPITOR) 40 MG tablet Take 1 tablet (40 mg total) by mouth daily at 6 PM. Generic ok 90 tablet 3  . bisacodyl (DULCOLAX) 5 MG EC tablet Take 1 tablet (5 mg total) by mouth daily as needed for moderate constipation. 30 tablet 5  . budesonide-formoterol (SYMBICORT) 160-4.5 MCG/ACT inhaler Inhale 2 puffs into the lungs 2 (two) times daily. Rinse mouth 1 Inhaler 11  . Calcium Carbonate-Vitamin D (CALCIUM 600+D) 600-400 MG-UNIT tablet Take 1 tablet by mouth 2 (two) times daily. Lunch and dinner 90 tablet 3  . cetirizine (ZYRTEC) 10 MG tablet Take 10 mg by mouth daily.    Marland Kitchen docusate sodium (COLACE) 100 MG capsule Take 1 capsule (100 mg total) by mouth 2 (two) times daily. 180 capsule 3  . fluticasone (FLONASE) 50 MCG/ACT nasal spray Place 1-2 sprays into both nostrils daily. Max 2 sprays 16 g 11  . furosemide (LASIX) 40 MG tablet TAKE 1 TABLET BY MOUTH DAILY 30 tablet 2  . gabapentin (NEURONTIN)  100 MG capsule Take 1 capsule (100 mg total) by mouth 2 (two) times daily. 180 capsule 3  . guaiFENesin (MUCINEX) 600 MG 12 hr tablet Take 600 mg by mouth daily.     Marland Kitchen ipratropium-albuterol (DUONEB) 0.5-2.5 (3) MG/3ML SOLN     . levothyroxine (SYNTHROID, LEVOTHROID) 200 MCG tablet Take 1 tablet (200 mcg total) by mouth daily before breakfast. Except on Sunday. Do not take with other medications or vitamins 90 tablet 1  . Melatonin 10 MG TABS Take by mouth.     . metFORMIN (GLUCOPHAGE-XR) 500 MG 24 hr tablet Take 1 tablet (500 mg total) by mouth daily with breakfast. 90 tablet 3  . mirtazapine (REMERON) 15 MG tablet Take 1 tablet (15 mg total) by mouth at bedtime. 90 tablet 3  . Multiple Vitamin (MULTIVITAMIN WITH MINERALS) TABS tablet Take 1 tablet by mouth daily.    . ondansetron (ZOFRAN) 4 MG tablet Take 1 tablet (4 mg total) by mouth every 8 (eight) hours as needed for nausea or vomiting. 30 tablet 1  . pantoprazole (PROTONIX) 40 MG tablet Take 1 tablet (40 mg total) by mouth daily. 30 minutes before lunch or dinner 90 tablet 3  . polyethylene glycol powder (GLYCOLAX/MIRALAX) powder Take 17 g by mouth daily. 255 g 11  . polyvinyl alcohol (LIQUIFILM TEARS) 1.4 % ophthalmic solution Place 1 drop into both eyes as needed for  dry eyes. 15 mL 11  . senna-docusate (SENOKOT-S) 8.6-50 MG tablet Take 1 tablet by mouth at bedtime as needed for mild constipation.    Marland Kitchen tiotropium (SPIRIVA) 18 MCG inhalation capsule Place 1 capsule (18 mcg total) into inhaler and inhale daily. 90 capsule 3  . warfarin (COUMADIN) 1 MG tablet Take 16 mg on MWF other days 15 mg total 120 tablet 3  . warfarin (COUMADIN) 10 MG tablet Total 15 mg qpm all days except MWF take 16 mg (5mg + 10 mg +1 mg) 90 tablet 3  . warfarin (COUMADIN) 5 MG tablet Take with 15mg  qd (10+5 mg qd) other days 90 tablet 3   No current facility-administered medications for this visit.     Past Medical History:  Diagnosis Date  . Acoustic neuroma  ([place])   . Allergy   . Asthma   . Bilateral swelling of feet    and legs  . Bladder infection   . CAD (coronary artery disease)   . Cataract   . Change in voice   . Compression fracture of body of thoracic vertebra (HCC)    T12 09/18/15 MRI s/p fall   . Constipation   . COPD (chronic obstructive pulmonary disease) ([place])    previous CXR with chronic interstitial lung dz   . CVA (cerebral vascular accident) ([place])   . Depression   . Diabetes ([place])    with neuropathy  . Diabetes mellitus, type 2 ([place])   . Diarrhea   . Double vision   . DVT (deep venous thrombosis) ([place])    right leg 10/2015 was on coumadin off as of 2017/2018 ; s/p IVC filter  . Enuresis   . Eye pain, right   . Fall   . Fatty liver    09/15/15 also mildly dilated pancreatitic duct rec MRCP small sub cm cyst hemangioma speeln mild right hydronephrorossi and prox. hydroureter, kidney stones, mild scarring kidneys  . Female stress incontinence   . Flank pain   . GERD (gastroesophageal reflux disease)    with small hiatal hernia   . Hard of hearing   . Heart disease   . History of kidney problems   . Hyperlipidemia    mixed  . Hypertension   . Hypothyroidism, postsurgical   . Impaired mobility and ADLs    uses rolling walker has caretaker 24/7 at home  . Leg edema   . Mixed incontinence urge and stress (female)(female)   . Neuropathy   . Osteoarthritis    DDD spine   . Osteoporosis with fracture    T12 compression fracture  . Photophobia   . Pulmonary embolism ([place])    10/2015 off coumadin as of 04/2016  . Pulmonary HTN (HCC)    mild pulm HTN, echo 10/09/15 EF 95-09%TOIZT 1 dd, RV systolic pressure increased   . Recurrent UTI   . Sinus pressure   . Skin cancer    BCC jawline and scalp   . Thyroid disease    follows [facility]  . TIA (transient ischemic attack)    MRI 2009/2010 neg stroke   . Trigeminal neuralgia    Dr. Tomi Bamberger s/p gamma knife x 2, on Tegretol since 2011/2012 no increase in dose  >200 mg bid rec per family per neurology   . Urinary frequency   . Urinary, incontinence, stress female    Dr Erlene Quan urology     Past Surgical History:  Procedure Laterality Date  . APPENDECTOMY     as a  child, open  . BRAIN SURGERY     schwnnoma removal 1996   . brain tumor surgery    . BREAST SURGERY     breast bx  . CATARACT EXTRACTION    . CHOLECYSTECTOMY    . EYE SURGERY     cataract  . IVC FILTER PLACEMENT ([facility] HX)     Dr. Lucky Cowboy 10/2015   . LAPAROSCOPIC TUBAL LIGATION    . MOHS SURGERY     scalp 04/2014   . PERIPHERAL VASCULAR CATHETERIZATION N/A 10/11/2015   Procedure: IVC Filter Insertion;  Surgeon: Algernon Huxley, MD;  Location: [facility];  Service: Cardiovascular;  Laterality: N/A;  . PERIPHERAL VASCULAR THROMBECTOMY Bilateral 03/29/2018   Procedure: PERIPHERAL VASCULAR THROMBECTOMY;  Surgeon: Algernon Huxley, MD;  Location: [facility];  Service: Cardiovascular;  Laterality: Bilateral;  . PUBOVAGINAL SLING    . THROAT SURGERY    . THYROID SURGERY     tumor around vocal cords   . TOOTH EXTRACTION     winter 2018   . TOTAL THYROIDECTOMY  1976   Social History        Tobacco Use  . Smoking status: Former Smoker    Types: Cigarettes    Last attempt to quit: 09/20/1995    Years since quitting: 22.6  . Smokeless tobacco: Never Used  . Tobacco comment: quit 1996 smoked 20 years max 8 cig qd   Substance Use Topics  . Alcohol use: No  . Drug use: No    Family History      Family History  Problem Relation Age of Onset  . Heart disease Mother   . Diabetes Father   . Cancer Daughter        breast ca x 2 s/p mastectomy           Allergies  Allergen Reactions  . Penicillins Shortness Of Breath, Rash and Other (See Comments)    Has patient had a PCN reaction causing immediate rash, facial/tongue/throat swelling, SOB or lightheadedness with hypotension: Yes Has patient had a PCN reaction causing severe rash involving mucus  membranes or skin necrosis: No Has patient had a PCN reaction that required hospitalization No Has patient had a PCN reaction occurring within the last 10 years: No If all of the above answers are "NO", then may proceed with Cephalosporin use.  . Sulfa Antibiotics Shortness Of Breath, Rash and Other (See Comments)  . Amitiza [Lubiprostone]     N/v/d  . Aspirin Other (See Comments)    Reaction:  Unknown  Other reaction(s): Bleeding (intolerance) Per patient " causes nose to bleed" Can take 81 mg daily without any complications Other reaction(s): "bloody nose"   . Penicillin G Other (See Comments)    Has patient had a PCN reaction causing immediate rash, facial/tongue/throat swelling, SOB or lightheadedness with hypotension: No Has patient had a PCN reaction causing severe rash involving mucus membranes or skin necrosis: Unknown Has patient had a PCN reaction that required hospitalization: Unknown Has patient had a PCN reaction occurring within the last 10 years: Unknown If all of the above answers are "NO", then may proceed with Cephalosporin use.     REVIEW OF SYSTEMS (Negative unless checked)  Constitutional: [] Weight loss  [] Fever  [] Chills Cardiac: [] Chest pain   [] Chest pressure   [x] Palpitations   [] Shortness of breath when laying flat   [] Shortness of breath at rest   [] Shortness of breath with exertion. Vascular:  [] Pain in legs with  walking   [] Pain in legs at rest   [] Pain in legs when laying flat   [] Claudication   [] Pain in feet when walking  [] Pain in feet at rest  [] Pain in feet when laying flat   [x] History of DVT   [] Phlebitis   [x] Swelling in legs   [] Varicose veins   [] Non-healing ulcers Pulmonary:   [] Uses home oxygen   [x] Productive cough   [] Hemoptysis   [] Wheeze  [x] COPD   [] Asthma Neurologic:  [] Dizziness  [] Blackouts   [] Seizures   [] History of stroke   [] History of TIA  [] Aphasia   [] Temporary blindness   [] Dysphagia   [] Weakness or numbness in arms    [] Weakness or numbness in legs Musculoskeletal:  [x] Arthritis   [] Joint swelling   [x] Joint pain   [] Low back pain Hematologic:  [x] Easy bruising  [] Easy bleeding   [] Hypercoagulable state   [x] Anemic   Gastrointestinal:  [] Blood in stool   [] Vomiting blood  [x] Gastroesophageal reflux/heartburn   [] Abdominal pain Genitourinary:  [x] Chronic kidney disease   [] Difficult urination  [] Frequent urination  [] Burning with urination   [] Hematuria Skin:  [] Rashes   [] Ulcers   [] Wounds Psychological:  [] History of anxiety   []  History of major depression.    Physical Examination  BP (!) 153/83 (BP Location: Right Arm, Patient Position: Sitting)   Pulse 78   Resp 20   Ht 5\' 7"  (1.702 m)   Wt 186 lb (84.4 kg)   LMP  (LMP Unknown)   BMI 29.13 kg/m  Gen:  WD/WN, NAD.  Appears much younger than her stated age Head: [place]/AT, No temporalis wasting. Ear/Nose/Throat: Hearing diminished, nares w/o erythema or drainage Eyes: Conjunctiva clear. Sclera non-icteric Neck: Supple.  Trachea midline Pulmonary:  Good air movement, no use of accessory muscles.  Cardiac: RRR, no JVD Vascular:  Vessel Right Left  Radial Palpable Palpable                          PT Palpable Palpable  DP Palpable Palpable   Gastrointestinal: soft, non-tender/non-distended.  Musculoskeletal: M/S 5/5 throughout.  No deformity or atrophy.  1+ bilateral lower extremity edema. Neurologic: Sensation grossly intact in extremities.  Symmetrical.  Speech is fluent.  Psychiatric: Judgment intact, Mood & affect appropriate for pt's clinical situation. Dermatologic: No rashes or ulcers noted.  No cellulitis or open wounds.       Labs Recent Results (from the past 2160 hour(s))  Comprehensive metabolic panel     Status: Abnormal   Collection Time: 08/29/18 11:39 AM  Result Value Ref Range   Sodium 141 135 - 145 mEq/L   Potassium 4.3 3.5 - 5.1 mEq/L   Chloride 102 96 - 112 mEq/L   CO2 31 19 - 32 mEq/L   Glucose, Bld 84 70  - 99 mg/dL   BUN 23 6 - 23 mg/dL   Creatinine, Ser 1.29 (H) 0.40 - 1.20 mg/dL   Total Bilirubin 0.5 0.2 - 1.2 mg/dL   Alkaline Phosphatase 62 39 - 117 U/L   AST 20 0 - 37 U/L   ALT 18 0 - 35 U/L   Total Protein 7.4 6.0 - 8.3 g/dL   Albumin 4.0 3.5 - 5.2 g/dL   Calcium 9.4 8.4 - 10.5 mg/dL   GFR 41.23 (L) >60.00 mL/min  Hemoglobin A1c     Status: Abnormal   Collection Time: 08/29/18 11:39 AM  Result Value Ref Range   Hgb A1c MFr Bld 6.7 (H)  4.6 - 6.5 %    Comment: Glycemic Control Guidelines for People with Diabetes:Non Diabetic:  <6%Goal of Therapy: <7%Additional Action Suggested:  >8%   INR/PT     Status: Abnormal   Collection Time: 08/29/18 11:39 AM  Result Value Ref Range   INR 4.5 (H) 0.8 - 1.0 ratio   Prothrombin Time 50.9 (H) 9.6 - 13.1 sec  Measles/Mumps/Rubella Immunity     Status: None   Collection Time: 08/29/18 11:39 AM  Result Value Ref Range   [place] IgG >300.00 AU/mL    Comment: AU/mL            Interpretation -----            -------------- <25.00           Negative 25.00-29.99      Equivocal >29.99           Positive . A positive result indicates that the patient has antibody to measles virus. It does not differentiate  between an active or past infection. The clinical  diagnosis must be interpreted in conjunction with  clinical signs and symptoms of the patient.    Mumps IgG >300.00 AU/mL    Comment:  AU/mL           Interpretation -------         ---------------- <9.00             Negative 9.00-10.99        Equivocal >10.99            Positive A positive result indicates that the patient has  antibody to mumps virus. It does not differentiate between an  active or past infection. The clinical diagnosis must be interpreted in conjunction with clinical signs and symptoms of the patient. .    Rubella 24.30 index    Comment:     Index            Interpretation     -----            --------------       <0.90            Not consistent with Immunity      0.90-0.99        Equivocal     > or = 1.00      Consistent with Immunity  . The presence of rubella IgG antibody suggests  immunization or past or current infection with rubella virus.     Radiology No results found.  Assessment/Plan Hypertension blood pressure control important in reducing the progression of atherosclerotic disease. On appropriate oral medications.   Diabetes mellitus, type 2 (HCC) blood glucose control important in reducing the progression of atherosclerotic disease. Also, involved in wound healing. On appropriate medications.  Presence of IVC filter Will remain in place given her age and multiple recurrent thrombotic issues.  Lymphedema Well-controlled with elevation.  From chronic scarring and lymphatic channels.  Would recommend compression stockings and elevation.  DVT (deep venous thrombosis) (HCC) Symptoms are quite good right now after previous interventions.  Would recommend she continue to wear compression stockings and elevate her legs.  We will see her back in about a year.    Leotis Pain, MD  11/12/2018 12:19 PM    This note was created with Dragon medical transcription system.  Any errors from dictation are purely unintentional

## 2018-11-12 NOTE — Assessment & Plan Note (Signed)
Will remain in place given her age and multiple recurrent thrombotic issues.

## 2018-11-12 NOTE — Telephone Encounter (Signed)
Rebecca has been informed

## 2018-11-12 NOTE — Assessment & Plan Note (Signed)
Symptoms are quite good right now after previous interventions.  Would recommend she continue to wear compression stockings and elevate her legs.  We will see her back in about a year.

## 2018-11-12 NOTE — Telephone Encounter (Signed)
Call the lung doctor as well to see what they think but I think it should be ok.  If patient feeling ok ask other siblings I.e nancy and john and if in agreement I am ok with this   [place]

## 2018-11-14 ENCOUNTER — Telehealth: Payer: Self-pay | Admitting: Internal Medicine

## 2018-11-14 LAB — POCT INR: INR: 1.9 — AB (ref 0.9–1.1)

## 2018-11-14 NOTE — Telephone Encounter (Signed)
Heather from [facility] calling to report that the pt's INR -1.9. INR level is from today, 11/14/18.

## 2018-11-14 NOTE — Telephone Encounter (Signed)
Please advise 

## 2018-11-14 NOTE — Telephone Encounter (Signed)
Continue dose 15 mg daily  except Mondaysand WEDNESDAYS andFridaysand Sundays 10 mg I am ok with INR 2-3.5  If above 3.5 hold the dose for the day  If below 2 take 1/2 of 1 mg pill so 0.5 mg as needed on day of low reading  Check INR 1x per week   [place]

## 2018-11-14 NOTE — Telephone Encounter (Signed)
caregiver has been notified.

## 2018-11-21 ENCOUNTER — Encounter: Payer: Self-pay | Admitting: Internal Medicine

## 2018-11-21 ENCOUNTER — Telehealth: Payer: Self-pay | Admitting: Internal Medicine

## 2018-11-21 LAB — POCT INR: INR: 2.3 (ref 2.0–3.0)

## 2018-11-21 NOTE — Telephone Encounter (Signed)
INR 2.3 11/21/18 normal range   TMS

## 2018-11-28 ENCOUNTER — Encounter: Payer: Self-pay | Admitting: Internal Medicine

## 2018-11-28 ENCOUNTER — Telehealth: Payer: Self-pay | Admitting: Internal Medicine

## 2018-11-28 LAB — POCT INR: INR: 2.8 (ref 2.0–3.0)

## 2018-11-28 NOTE — Telephone Encounter (Signed)
INR 2.8 on 11/28/18 cont. Current dose Coumadin  Continue dose 15 mg daily  except Mondaysand WEDNESDAYS andFridaysand Sundays 10 mg I am ok with INR 2-3.5  If above 3.5 hold the dose for the day  If below 2 take 1/2 of 1 mg pill so 0.5 mg as needed on day of low reading  Check INR 1x per week   [place]

## 2018-12-11 ENCOUNTER — Ambulatory Visit: Payer: Medicare Other | Admitting: Internal Medicine

## 2018-12-11 ENCOUNTER — Encounter: Payer: Self-pay | Admitting: Internal Medicine

## 2018-12-11 VITALS — BP 126/80 | HR 82 | Temp 97.8°F | Ht 67.0 in | Wt 187.4 lb

## 2018-12-11 DIAGNOSIS — E119 Type 2 diabetes mellitus without complications: Secondary | ICD-10-CM | POA: Diagnosis not present

## 2018-12-11 DIAGNOSIS — E039 Hypothyroidism, unspecified: Secondary | ICD-10-CM

## 2018-12-11 DIAGNOSIS — M1990 Unspecified osteoarthritis, unspecified site: Secondary | ICD-10-CM | POA: Diagnosis not present

## 2018-12-11 DIAGNOSIS — R600 Localized edema: Secondary | ICD-10-CM

## 2018-12-11 DIAGNOSIS — I82513 Chronic embolism and thrombosis of femoral vein, bilateral: Secondary | ICD-10-CM

## 2018-12-11 DIAGNOSIS — J449 Chronic obstructive pulmonary disease, unspecified: Secondary | ICD-10-CM

## 2018-12-11 DIAGNOSIS — C44310 Basal cell carcinoma of skin of unspecified parts of face: Secondary | ICD-10-CM

## 2018-12-11 DIAGNOSIS — I10 Essential (primary) hypertension: Secondary | ICD-10-CM | POA: Diagnosis not present

## 2018-12-11 DIAGNOSIS — R040 Epistaxis: Secondary | ICD-10-CM | POA: Insufficient documentation

## 2018-12-11 LAB — CBC WITH DIFFERENTIAL/PLATELET
Basophils Absolute: 0 10*3/uL (ref 0.0–0.1)
Basophils Relative: 0.5 % (ref 0.0–3.0)
Eosinophils Absolute: 0.1 10*3/uL (ref 0.0–0.7)
Eosinophils Relative: 2.1 % (ref 0.0–5.0)
HCT: 42.2 % (ref 36.0–46.0)
Hemoglobin: 13.7 g/dL (ref 12.0–15.0)
Lymphocytes Relative: 28.1 % (ref 12.0–46.0)
Lymphs Abs: 1.7 10*3/uL (ref 0.7–4.0)
MCHC: 32.4 g/dL (ref 30.0–36.0)
MCV: 89.1 fl (ref 78.0–100.0)
Monocytes Absolute: 0.6 10*3/uL (ref 0.1–1.0)
Monocytes Relative: 10 % (ref 3.0–12.0)
Neutro Abs: 3.6 10*3/uL (ref 1.4–7.7)
Neutrophils Relative %: 59.3 % (ref 43.0–77.0)
Platelets: 220 10*3/uL (ref 150.0–400.0)
RBC: 4.74 Mil/uL (ref 3.87–5.11)
RDW: 17.6 % — ABNORMAL HIGH (ref 11.5–15.5)
WBC: 6.1 10*3/uL (ref 4.0–10.5)

## 2018-12-11 LAB — COMPREHENSIVE METABOLIC PANEL
ALT: 15 U/L (ref 0–35)
AST: 21 U/L (ref 0–37)
Albumin: 3.6 g/dL (ref 3.5–5.2)
Alkaline Phosphatase: 52 U/L (ref 39–117)
BUN: 20 mg/dL (ref 6–23)
CO2: 33 mEq/L — ABNORMAL HIGH (ref 19–32)
Calcium: 8.9 mg/dL (ref 8.4–10.5)
Chloride: 104 mEq/L (ref 96–112)
Creatinine, Ser: 1.21 mg/dL — ABNORMAL HIGH (ref 0.40–1.20)
GFR: 44.37 mL/min — ABNORMAL LOW (ref >60.00)
Glucose, Bld: 91 mg/dL (ref 70–99)
Potassium: 3.7 mEq/L (ref 3.5–5.1)
Sodium: 143 mEq/L (ref 135–145)
Total Bilirubin: 0.4 mg/dL (ref 0.2–1.2)
Total Protein: 6.6 g/dL (ref 6.0–8.3)

## 2018-12-11 LAB — TSH: TSH: 1.15 u[IU]/mL (ref 0.35–4.50)

## 2018-12-11 LAB — HEMOGLOBIN A1C: Hgb A1c MFr Bld: 6.7 % — ABNORMAL HIGH (ref 4.6–6.5)

## 2018-12-11 MED ORDER — ACETAMINOPHEN 325 MG PO TABS: 650.0000 mg | ORAL_TABLET | Freq: Four times a day (QID) | ORAL | 11 refills | Status: DC | PRN

## 2018-12-11 NOTE — Progress Notes (Addendum)
Chief Complaint  Patient presents with  . Follow-up    2moROV- no new complaints   F/u with caretaker  1. 3 nosebleeds w/in the last 2 weeks but resolved for now  2. H/o DVT b/l legs on coumadin INR weekly checks at home last 12/06/2018 INR 3.1  3. She had NMSC to face and saw dermatology left cheek tx and resolved will f/u in 02/2019  4.HTN controlled off meds ACEI 5. DM 2 last A1C 6.7 will check today on metformin daily  6. COPD stable on RA O2 94% today  7. Leg edema improved wearing compression stockings today ok by vascular to wear she asks about lasix 40 mg qd due to overactive bladder if she has to continue to take this disc today can cut pill in 1/2 20 mg and take another 20 mg prn if needed  8. Hypothyroidism on synthryoid 200 mcg skip sundays check labs today    Review of Systems  Constitutional: Negative for weight loss.  HENT: Positive for hearing loss.        +nosebleeds   Eyes: Negative for blurred vision.  Respiratory: Negative for shortness of breath.   Cardiovascular: Negative for chest pain.  Gastrointestinal: Negative for abdominal pain.  Musculoskeletal: Negative for falls.  Skin: Negative for rash.  Neurological: Negative for headaches.  Psychiatric/Behavioral: Negative for depression.   Past Medical History:  Diagnosis Date  . [place]oustic neuroma ([place]   . Allergy   . Asthma   . Bilateral swelling of feet    and legs  . Bladder infection   . CAD (coronary artery disease)   . Cataract   . Change in voice   . Compression fracture of body of thoracic vertebra (HCC)    T12 09/18/15 MRI s/p fall   . Constipation   . COPD (chronic obstructive pulmonary disease) ([place]    previous CXR with chronic interstitial lung dz   . CVA (cerebral vascular accident) ([place]   . Depression   . Diabetes ([place]    with neuropathy  . Diabetes mellitus, type 2 ([place]   . Diarrhea   . Double vision   . DVT (deep venous thrombosis) ([place]    right leg 10/2015 was on coumadin  off as of 2017/2018 ; s/p IVC filter  . Enuresis   . Eye pain, right   . Fall   . Fatty liver    09/15/15 also mildly dilated pancreatitic duct rec MRCP small sub cm cyst hemangioma speeln mild right hydronephrorossi and prox. hydroureter, kidney stones, mild scarring kidneys  . Female stress incontinence   . Flank pain   . GERD (gastroesophageal reflux disease)    with small hiatal hernia   . Hard of hearing   . Heart disease   . History of kidney problems   . Hyperlipidemia    mixed  . Hypertension   . Hypothyroidism, postsurgical   . Impaired mobility and ADLs    uses rolling walker has caretaker 24/7 at home  . Leg edema   . Mixed incontinence urge and stress (female)(female)   . Neuropathy   . Osteoarthritis    DDD spine   . Osteoporosis with fracture    T12 compression fracture  . Photophobia   . Pulmonary embolism ([place]    10/2015 off coumadin as of 04/2016  . Pulmonary HTN (HCC)    mild pulm HTN, echo 10/09/15 EF 595-18%ACZYS1 dd, RV systolic pressure increased   . Recurrent UTI   .  Sinus pressure   . Skin cancer    BCC jawline and scalp   . Thyroid disease    follows [facility]  . TIA (transient ischemic attack)    MRI 2009/2010 neg stroke   . Trigeminal neuralgia    Dr. Tomi Bamberger s/p gamma knife x 2, on Tegretol since 2011/2012 no increase in dose >200 mg bid rec per family per neurology   . Urinary frequency   . Urinary, incontinence, stress female    Dr Erlene Quan urology    Past Surgical History:  Procedure Laterality Date  . APPENDECTOMY     as a child, open  . BRAIN SURGERY     schwnnoma removal 1996   . brain tumor surgery    . BREAST SURGERY     breast bx  . CATARACT EXTRACTION    . CHOLECYSTECTOMY    . EYE SURGERY     cataract  . IVC FILTER PLACEMENT ([facility] HX)     Dr. Lucky Cowboy 10/2015   . LAPAROSCOPIC TUBAL LIGATION    . MOHS SURGERY     scalp 04/2014   . PERIPHERAL VASCULAR CATHETERIZATION N/A 10/11/2015   Procedure: IVC Filter Insertion;   Surgeon: Algernon Huxley, MD;  Location: [facility];  Service: Cardiovascular;  Laterality: N/A;  . PERIPHERAL VASCULAR THROMBECTOMY Bilateral 03/29/2018   Procedure: PERIPHERAL VASCULAR THROMBECTOMY;  Surgeon: Algernon Huxley, MD;  Location: [facility];  Service: Cardiovascular;  Laterality: Bilateral;  . PUBOVAGINAL SLING    . THROAT SURGERY    . THYROID SURGERY     tumor around vocal cords   . TOOTH EXTRACTION     winter 2018   . TOTAL THYROIDECTOMY  1976   Family History  Problem Relation Age of Onset  . Heart disease Mother   . Diabetes Father   . Cancer Daughter        breast ca x 2 s/p mastectomy    Social History   Socioeconomic History  . Marital status: Widowed    Spouse name: Not on file  . Number of children: 5  . Years of education: Not on file  . Highest education level: Not on file  Occupational History  . Occupation: retired  Scientific laboratory technician  . Financial resource strain: Not hard at all  . Food insecurity:    Worry: Never true    Inability: Never true  . Transportation needs:    Medical: No    Non-medical: No  Tobacco Use  . Smoking status: Former Smoker    Packs/day: 0.50    Years: 20.00    Pack years: 10.00    Types: Cigarettes    Last attempt to quit: 09/20/1995    Years since quitting: 23.2  . Smokeless tobacco: Never Used  . Tobacco comment: quit 1996 smoked 20 years max 8 cig qd   Substance and Sexual Activity  . Alcohol use: No  . Drug use: No  . Sexual activity: Not on file  Lifestyle  . Physical activity:    Days per week: Not on file    Minutes per session: Not on file  . Stress: Not at all  Relationships  . Social connections:    Talks on phone: Not on file    Gets together: Not on file    Attends religious service: Not on file    Active member of club or organization: Not on file    Attends meetings of clubs or organizations: Not on file  Relationship status: Not on file  . Intimate partner violence:    Fear of  current or ex partner: Not on file    Emotionally abused: Not on file    Physically abused: Not on file    Forced sexual activity: Not on file  Other Topics Concern  . Not on file  Social History Narrative   Lives at home with caretaker       Current Meds  Medication Sig  . acetaminophen (TYLENOL) 325 MG tablet Take 650 mg by mouth every 6 (six) hours as needed.  Marland Kitchen albuterol (PROVENTIL) (2.5 MG/3ML) 0.083% nebulizer solution Take 2.5 mg by nebulization 3 (three) times daily.  . AMBULATORY NON FORMULARY MEDICATION Medication Name: Please dispense flutter valve to use three times daily after nebulization. [id number]:J47.9  . atorvastatin (LIPITOR) 40 MG tablet Take 1 tablet (40 mg total) by mouth daily at 6 PM. Generic ok  . bisacodyl (DULCOLAX) 5 MG EC tablet Take 1 tablet (5 mg total) by mouth daily as needed for moderate constipation.  . budesonide-formoterol (SYMBICORT) 160-4.5 MCG/ACT inhaler Inhale 2 puffs into the lungs 2 (two) times daily. Rinse mouth  . cetirizine (ZYRTEC) 10 MG tablet Take 10 mg by mouth daily.  Marland Kitchen docusate sodium (COLACE) 100 MG capsule Take 1 capsule (100 mg total) by mouth 2 (two) times daily.  . fluticasone (FLONASE) 50 MCG/ACT nasal spray Place 1-2 sprays into both nostrils daily. Max 2 sprays  . furosemide (LASIX) 40 MG tablet TAKE 1 TABLET BY MOUTH DAILY  . gabapentin (NEURONTIN) 100 MG capsule Take 1 capsule (100 mg total) by mouth 2 (two) times daily.  Marland Kitchen guaiFENesin (MUCINEX) 600 MG 12 hr tablet Take 600 mg by mouth daily.   Marland Kitchen ipratropium-albuterol (DUONEB) 0.5-2.5 (3) MG/3ML SOLN   . levothyroxine (SYNTHROID, LEVOTHROID) 200 MCG tablet Take 1 tablet (200 mcg total) by mouth daily before breakfast. Except on Sunday. Do not take with other medications or vitamins  . Melatonin 10 MG TABS Take by mouth.   . metFORMIN (GLUCOPHAGE-XR) 500 MG 24 hr tablet Take 1 tablet (500 mg total) by mouth daily with breakfast.  . mirtazapine (REMERON) 15 MG tablet Take 1 tablet (15  mg total) by mouth at bedtime.  . Multiple Vitamin (MULTIVITAMIN WITH MINERALS) TABS tablet Take 1 tablet by mouth daily.  . ondansetron (ZOFRAN) 4 MG tablet Take 1 tablet (4 mg total) by mouth every 8 (eight) hours as needed for nausea or vomiting.  . pantoprazole (PROTONIX) 40 MG tablet Take 1 tablet (40 mg total) by mouth daily. 30 minutes before lunch or dinner  . polyethylene glycol powder (GLYCOLAX/MIRALAX) powder Take 17 g by mouth daily.  . polyvinyl alcohol (LIQUIFILM TEARS) 1.4 % ophthalmic solution Place 1 drop into both eyes as needed for dry eyes.  Marland Kitchen senna-docusate (SENOKOT-S) 8.6-50 MG tablet Take 1 tablet by mouth at bedtime as needed for mild constipation.  Marland Kitchen tiotropium (SPIRIVA) 18 MCG inhalation capsule Place 1 capsule (18 mcg total) into inhaler and inhale daily.  Marland Kitchen warfarin (COUMADIN) 1 MG tablet Take 16 mg on MWF other days 15 mg total  . warfarin (COUMADIN) 10 MG tablet Total 15 mg qpm all days except MWF take 16 mg (2m+ 10 mg +1 mg)  . warfarin (COUMADIN) 5 MG tablet Take with 127mqd (10+5 mg qd) other days   Allergies  Allergen Reactions  . Penicillins Shortness Of Breath, Rash and Other (See Comments)    Has patient had a PCN reaction causing  immediate rash, facial/tongue/throat swelling, SOB or lightheadedness with hypotension: Yes Has patient had a PCN reaction causing severe rash involving mucus membranes or skin necrosis: No Has patient had a PCN reaction that required hospitalization No Has patient had a PCN reaction occurring within the last 10 years: No If all of the above answers are "NO", then may proceed with Cephalosporin use.  . Sulfa Antibiotics Shortness Of Breath, Rash and Other (See Comments)  . Amitiza [Lubiprostone]     N/v/d  . Aspirin Other (See Comments)    Reaction:  Unknown  Other reaction(s): Bleeding (intolerance) Per patient " causes nose to bleed" Can take 81 mg daily without any complications Other reaction(s): "bloody nose"   .  Penicillin G Other (See Comments)    Has patient had a PCN reaction causing immediate rash, facial/tongue/throat swelling, SOB or lightheadedness with hypotension: No Has patient had a PCN reaction causing severe rash involving mucus membranes or skin necrosis: Unknown Has patient had a PCN reaction that required hospitalization: Unknown Has patient had a PCN reaction occurring within the last 10 years: Unknown If all of the above answers are "NO", then may proceed with Cephalosporin use.   Recent Results (from the past 2160 hour(s))  POCT INR     Status: Abnormal   Collection Time: 10/30/18 12:00 AM  Result Value Ref Range   INR 1.5 (A) 2.0 - 3.0  POCT INR     Status: None   Collection Time: 11/04/18 12:00 AM  Result Value Ref Range   INR    POCT INR     Status: Abnormal   Collection Time: 11/14/18 12:00 AM  Result Value Ref Range   INR 1.9 (A) 0.9 - 1.1  POCT INR     Status: None   Collection Time: 11/21/18 12:00 AM  Result Value Ref Range   INR 2.3 2.0 - 3.0  POCT INR     Status: None   Collection Time: 11/28/18 12:00 AM  Result Value Ref Range   INR 2.8 2.0 - 3.0   Objective  Body mass index is 29.35 kg/m. Wt Readings from Last 3 Encounters:  12/11/18 187 lb 6.4 oz (85 kg)  11/12/18 186 lb (84.4 kg)  09/19/18 187 lb 1.9 oz (84.9 kg)   Temp Readings from Last 3 Encounters:  12/11/18 97.8 F (36.6 C) (Oral)  09/19/18 98.2 F (36.8 C) (Oral)  08/29/18 97.9 F (36.6 C) (Oral)   BP Readings from Last 3 Encounters:  12/11/18 126/80  11/12/18 (!) 153/83  09/19/18 118/72   Pulse Readings from Last 3 Encounters:  12/11/18 82  11/12/18 78  09/19/18 65    Physical Exam Vitals signs and nursing note reviewed.  Constitutional:      Appearance: Normal appearance. She is well-developed.  HENT:     Head: Normocephalic and atraumatic.     Nose: Nose normal.     Mouth/Throat:     Mouth: Mucous membranes are moist.     Pharynx: Oropharynx is clear.  Eyes:      Conjunctiva/sclera: Conjunctivae normal.     Pupils: Pupils are equal, round, and reactive to light.  Cardiovascular:     Rate and Rhythm: Normal rate and regular rhythm.     Heart sounds: Normal heart sounds.     Comments: 1+ leg edema b/l  Pulmonary:     Effort: Pulmonary effort is normal.     Breath sounds: Normal breath sounds.  Skin:    General: Skin is warm  and dry.  Neurological:     General: No focal deficit present.     Mental Status: She is alert and oriented to person, place, and time.     Gait: Gait normal.     Comments: Using rolling walker today    Psychiatric:        Attention and Perception: Attention and perception normal.        Mood and Affect: Mood and affect normal.        Speech: Speech normal.        Behavior: Behavior normal. Behavior is cooperative.        Thought Content: Thought content normal.        Cognition and Memory: Cognition and memory normal.        Judgment: Judgment normal.     Assessment   1. Epistaxis resolved  2. H/o DVT b/l legs on coumadin INR weekly checks at home last 12/06/2018 INR 3.1 with IVC filter   3. She had NMSC to face and saw dermatology left cheek tx and resolved will f/u in 02/2019  4.HTN controlled off meds ACEI 5. DM 2 last A1C 6.7  6. COPD stable on RA O2 94% today  7. Leg edema improved wearing compression stockings today ok by vascular to wear she asks about lasix 40 mg qd due to overactive bladder if she has to continue to take this disc today can cut pill in 1/2 20 mg and take another 20 mg prn if needed  8. Hypothyroidism on synthryoid 200 mcg skip sundays check labs today   Plan   1. Call back if continues will refer to ENT already established  2. Continue same dose coumadin check INR weekly at hom e Continue dose:   15 mg daily  except Mondaysand WEDNESDAYS andFridaysand Sundays 10 mg I am ok with INR 2-3.5  If above 3.5 hold the dose for the day  If below 2 take 1/2 of 1 mg pill so 0.5 mg as needed on  day of low reading  3.signed release [facility] derm records today  4. BP normal off ACEI hold due to h/o hypotension on ACEI 5. Check labs today CMET, CBC, A1C, TSH today  Cont metformin for now if Cr elevated or if GFR low consider another option for DM control I.e januvia  6. Cont inhalers and flutter valve  7. Can cut lasix in 20 mg and take 20-40 mg daily prn Check with vascular if ok with compression stockings  8.  Fluutd pna 23 utd  prevnar had 09/20/16 Tdap 08/04/16 shingrixnot had out of stock called pharmacy,? zostavaxif had MMR immune   F/u dermatology appt 09/02/18 [facility] will see again in 02/2019 requested records h/o NMSC  -02/03/19 appt Dr. Evorn Gong [facility] Aks, ISK, bx right hand h/o BCC and SCC f/u in 8 months   Out of age window pap, colonoscopy, mammo DEXA 01/01/08 osteopeniawill make sure on ca 600 bid and vit D 1000 iu qd -Vitamin d 03/15/18 48. -consider another DEXA  Provider: Dr. Olivia Mackie McLean-Scocuzza-[facility] Medicine

## 2018-12-11 NOTE — Patient Instructions (Addendum)
Lasix can cut in 1/2 pill in the am and if needed take another 1/2 pill   Your legs look better I will ask vascular about compression stockings again  Happy New Year    Diabetes Mellitus and Nutrition, Adult When you have diabetes (diabetes mellitus), it is very important to have healthy eating habits because your blood sugar (glucose) levels are greatly affected by what you eat and drink. Eating healthy foods in the appropriate amounts, at about the same times every day, can help you:  Control your blood glucose.  Lower your risk of heart disease.  Improve your blood pressure.  Reach or maintain a healthy weight. Every person with diabetes is different, and each person has different needs for a meal plan. Your health care provider may recommend that you work with a diet and nutrition specialist (dietitian) to make a meal plan that is best for you. Your meal plan may vary depending on factors such as:  The calories you need.  The medicines you take.  Your weight.  Your blood glucose, blood pressure, and cholesterol levels.  Your activity level.  Other health conditions you have, such as heart or kidney disease. How do carbohydrates affect me? Carbohydrates, also called carbs, affect your blood glucose level more than any other type of food. Eating carbs naturally raises the amount of glucose in your blood. Carb counting is a method for keeping track of how many carbs you eat. Counting carbs is important to keep your blood glucose at a healthy level, especially if you use insulin or take certain oral diabetes medicines. It is important to know how many carbs you can safely have in each meal. This is different for every person. Your dietitian can help you calculate how many carbs you should have at each meal and for each snack. Foods that contain carbs include:  Bread, cereal, rice, pasta, and crackers.  Potatoes and corn.  Peas, beans, and lentils.  Milk and yogurt.  Fruit  and juice.  Desserts, such as cakes, cookies, ice cream, and candy. How does alcohol affect me? Alcohol can cause a sudden decrease in blood glucose (hypoglycemia), especially if you use insulin or take certain oral diabetes medicines. Hypoglycemia can be a life-threatening condition. Symptoms of hypoglycemia (sleepiness, dizziness, and confusion) are similar to symptoms of having too much alcohol. If your health care provider says that alcohol is safe for you, follow these guidelines:  Limit alcohol intake to no more than 1 drink per day for nonpregnant women and 2 drinks per day for men. One drink equals 12 oz of beer, 5 oz of wine, or 1 oz of hard liquor.  Do not drink on an empty stomach.  Keep yourself hydrated with water, diet soda, or unsweetened iced tea.  Keep in mind that regular soda, juice, and other mixers may contain a lot of sugar and must be counted as carbs. What are tips for following this plan?  Reading food labels  Start by checking the serving size on the "Nutrition Facts" label of packaged foods and drinks. The amount of calories, carbs, fats, and other nutrients listed on the label is based on one serving of the item. Many items contain more than one serving per package.  Check the total grams (g) of carbs in one serving. You can calculate the number of servings of carbs in one serving by dividing the total carbs by 15. For example, if a food has 30 g of total carbs, it would be  equal to 2 servings of carbs.  Check the number of grams (g) of saturated and trans fats in one serving. Choose foods that have low or no amount of these fats.  Check the number of milligrams (mg) of salt (sodium) in one serving. Most people should limit total sodium intake to less than 2,300 mg per day.  Always check the nutrition information of foods labeled as "low-fat" or "nonfat". These foods may be higher in added sugar or refined carbs and should be avoided.  Talk to your dietitian  to identify your daily goals for nutrients listed on the label. Shopping  Avoid buying canned, premade, or processed foods. These foods tend to be high in fat, sodium, and added sugar.  Shop around the outside edge of the grocery store. This includes fresh fruits and vegetables, bulk grains, fresh meats, and fresh dairy. Cooking  Use low-heat cooking methods, such as baking, instead of high-heat cooking methods like deep frying.  Cook using healthy oils, such as olive, canola, or sunflower oil.  Avoid cooking with butter, cream, or high-fat meats. Meal planning  Eat meals and snacks regularly, preferably at the same times every day. Avoid going long periods of time without eating.  Eat foods high in fiber, such as fresh fruits, vegetables, beans, and whole grains. Talk to your dietitian about how many servings of carbs you can eat at each meal.  Eat 4-6 ounces (oz) of lean protein each day, such as lean meat, chicken, fish, eggs, or tofu. One oz of lean protein is equal to: ? 1 oz of meat, chicken, or fish. ? 1 egg. ?  cup of tofu.  Eat some foods each day that contain healthy fats, such as avocado, nuts, seeds, and fish. Lifestyle  Check your blood glucose regularly.  Exercise regularly as told by your health care provider. This may include: ? 150 minutes of moderate-intensity or vigorous-intensity exercise each week. This could be brisk walking, biking, or water aerobics. ? Stretching and doing strength exercises, such as yoga or weightlifting, at least 2 times a week.  Take medicines as told by your health care provider.  Do not use any products that contain nicotine or tobacco, such as cigarettes and e-cigarettes. If you need help quitting, ask your health care provider.  Work with a Social worker or diabetes educator to identify strategies to manage stress and any emotional and social challenges. Questions to ask a health care provider  Do I need to meet with a diabetes  educator?  Do I need to meet with a dietitian?  What number can I call if I have questions?  When are the best times to check my blood glucose? Where to find more information:  American Diabetes Association: diabetes.org  Academy of Nutrition and Dietetics: www.eatright.[employer] of Diabetes and Digestive and Kidney Diseases (NIH): [employer] Summary  A healthy meal plan will help you control your blood glucose and maintain a healthy lifestyle.  Working with a diet and nutrition specialist (dietitian) can help you make a meal plan that is best for you.  Keep in mind that carbohydrates (carbs) and alcohol have immediate effects on your blood glucose levels. It is important to count carbs and to use alcohol carefully. This information is not intended to replace advice given to you by your health care provider. Make sure you discuss any questions you have with your health care provider. Document Released: 08/17/2005 Document Revised: 06/20/2017 Document Reviewed: 12/25/2016 Elsevier Interactive Patient Education  2019  [employer].

## 2018-12-11 NOTE — Progress Notes (Signed)
Advise pt ok to wear compression stockings per Dr. Lucky Cowboy vascular   [place]

## 2018-12-13 ENCOUNTER — Telehealth: Payer: Self-pay | Admitting: Internal Medicine

## 2018-12-13 NOTE — Telephone Encounter (Signed)
12/12/2018 INR 3.0 at goal   [place]

## 2018-12-18 NOTE — Progress Notes (Signed)
Patient has been notified

## 2018-12-19 ENCOUNTER — Other Ambulatory Visit: Payer: Self-pay | Admitting: Internal Medicine

## 2018-12-19 DIAGNOSIS — E119 Type 2 diabetes mellitus without complications: Secondary | ICD-10-CM

## 2018-12-19 MED ORDER — SITAGLIPTIN PHOSPHATE 50 MG PO TABS: 50.0000 mg | ORAL_TABLET | Freq: Every day | ORAL | 3 refills | Status: DC

## 2018-12-26 ENCOUNTER — Telehealth: Payer: Self-pay | Admitting: *Deleted

## 2018-12-26 ENCOUNTER — Ambulatory Visit: Payer: Medicare Other | Admitting: Family Medicine

## 2018-12-26 ENCOUNTER — Ambulatory Visit: Payer: Self-pay

## 2018-12-26 VITALS — BP 124/72 | HR 73 | Temp 98.1°F | Resp 20 | Ht 67.0 in | Wt 190.6 lb

## 2018-12-26 DIAGNOSIS — N819 Female genital prolapse, unspecified: Secondary | ICD-10-CM | POA: Diagnosis not present

## 2018-12-26 DIAGNOSIS — R39198 Other difficulties with micturition: Secondary | ICD-10-CM

## 2018-12-26 LAB — POCT URINALYSIS DIPSTICK
Bilirubin, UA: NEGATIVE
Blood, UA: NEGATIVE
Glucose, UA: NEGATIVE
Ketones, UA: NEGATIVE
Nitrite, UA: NEGATIVE
Protein, UA: NEGATIVE
Spec Grav, UA: 1.02 (ref 1.010–1.025)
Urobilinogen, UA: 0.2 E.U./dL
pH, UA: 6 (ref 5.0–8.0)

## 2018-12-26 NOTE — Telephone Encounter (Signed)
Call pt do not take Coumadin today  Check INR tomorrow  Follow protocol as in last note   [place]

## 2018-12-26 NOTE — Patient Instructions (Signed)
Pelvic Organ Prolapse Pelvic organ prolapse is the stretching, bulging, or dropping of pelvic organs into an abnormal position. It happens when the muscles and tissues that surround and support pelvic structures become weak or stretched. Pelvic organ prolapse can involve the:  Vagina (vaginal prolapse).  Uterus (uterine prolapse).  Bladder (cystocele).  Rectum (rectocele).  Intestines (enterocele). When organs other than the vagina are involved, they often bulge into the vagina or protrude from the vagina, depending on how severe the prolapse is. What are the causes? This condition may be caused by:  Pregnancy, labor, and childbirth.  Past pelvic surgery.  Decreased production of the hormone estrogen associated with menopause.  Consistently lifting more than 50 lb (23 kg).  Obesity.  Long-term inability to pass stool (chronic constipation).  A cough that lasts a long time (chronic).  Buildup of fluid in the abdomen due to certain diseases and other conditions. What are the signs or symptoms? Symptoms of this condition include:  Passing a little urine (loss of bladder control) when you cough, sneeze, strain, and exercise (stress incontinence). This may be worse immediately after childbirth. It may gradually improve over time.  Feeling pressure in your pelvis or vagina. This pressure may increase when you cough or when you are passing stool.  A bulge that protrudes from the opening of your vagina.  Difficulty passing urine or stool.  Pain in your lower back.  Pain, discomfort, or disinterest in sex.  Repeated bladder infections (urinary tract infections).  Difficulty inserting a tampon. In some people, this condition causes no symptoms. How is this diagnosed? This condition may be diagnosed based on a vaginal and rectal exam. During the exam, you may be asked to cough and strain while you are lying down, sitting, and standing up. Your health care provider will  determine if other tests are required, such as bladder function tests. How is this treated? Treatment for this condition may depend on your symptoms. Treatment may include:  Lifestyle changes, such as changes to your diet.  Emptying your bladder at scheduled times (bladder training therapy). This can help reduce or avoid urinary incontinence.  Estrogen. Estrogen may help mild prolapse by increasing the strength and tone of pelvic floor muscles.  Kegel exercises. These may help mild cases of prolapse by strengthening and tightening the muscles of the pelvic floor.  A soft, flexible device that helps support the vaginal walls and keep pelvic organs in place (pessary). This is inserted into your vagina by your health care provider.  Surgery. This is often the only form of treatment for severe prolapse. Follow these instructions at home:  Avoid drinking beverages that contain caffeine or alcohol.  Increase your intake of high-fiber foods. This can help decrease constipation and straining during bowel movements.  Lose weight if recommended by your health care provider.  Wear a sanitary pad or adult diapers if you have urinary incontinence.  Avoid heavy lifting and straining with exercise and work. Do not hold your breath when you perform mild to moderate lifting and exercise activities. Limit your activities as directed by your health care provider.  Do Kegel exercises as directed by your health care provider. To do this: ? Squeeze your pelvic floor muscles tight. You should feel a tight lift in your rectal area and a tightness in your vaginal area. Keep your stomach, buttocks, and legs relaxed. ? Hold the muscles tight for up to 10 seconds. ? Relax your muscles. ? Repeat this exercise 50 times a day,   or as many times as told by your health care provider. Continue to do this exercise for at least 4-6 weeks, or for as long as told by your health care provider.  Take over-the-counter and  prescription medicines only as told by your health care provider.  If you have a pessary, take care of it as told by your health care provider.  Keep all follow-up visits as told by your health care provider. This is important. Contact a health care provider if you:  Have symptoms that interfere with your daily activities or sex life.  Need medicine to help with the discomfort.  Notice bleeding from your vagina that is not related to your period.  Have a fever.  Have pain or bleeding when you urinate.  Have bleeding when you pass stool.  Pass urine when you have sex.  Have chronic constipation.  Have a pessary that falls out.  Have bad smelling vaginal discharge.  Have an unusual, low pain in your abdomen. Summary  Pelvic organ prolapse is the stretching, bulging, or dropping of pelvic organs into an abnormal position. It happens when the muscles and tissues that surround and support pelvic structures become weak or stretched.  When organs other than the vagina are involved, they often bulge into the vagina or protrude from the vagina, depending on how severe the prolapse is.  In most cases, this condition needs to be treated only if it produces symptoms. Treatment may include lifestyle changes, estrogen, Kegel exercises, pessary insertion, or surgery.  Avoid heavy lifting and straining with exercise and work. Do not hold your breath when you perform mild to moderate lifting and exercise activities. Limit your activities as directed by your health care provider. This information is not intended to replace advice given to you by your health care provider. Make sure you discuss any questions you have with your health care provider. Document Released: 06/17/2014 Document Revised: 12/12/2017 Document Reviewed: 12/12/2017 Elsevier Interactive Patient Education  2019 [employer]  

## 2018-12-26 NOTE — Telephone Encounter (Signed)
FYI Pt called [facility] she has a appt today  Caregiver, Arbie Cookey, with [facility], states pt. Told her yesterday that she feels "like something had dropped in her vagina." Denies any pain or difficulty voiding. No fever or vaginal discharge. Appointment made by agent for today.

## 2018-12-26 NOTE — Telephone Encounter (Signed)
Calling to report abnormal lab. Call disconnected before I could take the call. Call to company to get result for PCP.  INR- 4.7 result for today

## 2018-12-26 NOTE — Progress Notes (Signed)
Subjective:    Patient ID: Jessica Erickson, female    DOB: 12-22-1925, 85 y.o.   MRN: [id number]  HPI   Patient presents to clinic complaining of some pressure with urination and feeling like something is falling out of her vagina.  Patient denies any pain with urination, burning or bad smell to urine.  Patient has had 5 children and also has history of a pubovaginal sling surgery.   Otherwise denies fever or chills.  Denies nausea/vomiting or diarrhea.  Denies any abdominal pain.  Patient Active Problem List   Diagnosis Date Noted  . Epistaxis 12/11/2018  . DVT (deep venous thrombosis) ([place]) 11/12/2018  . Bilateral leg edema 06/04/2018  . Hearing loss 06/04/2018  . Actinic keratosis 06/04/2018  . Bronchiectasis ([place]) 06/04/2018  . Depression 05/17/2018  . Dehydration 05/15/2018  . Hypertension 05/03/2018  . Lymphedema 05/02/2018  . Iron deficiency anemia 05/02/2018  . Physical deconditioning 05/02/2018  . Drug interaction 05/02/2018  . Supratherapeutic INR 05/02/2018  . Presence of IVC filter 05/02/2018  . Anemia 04/11/2018  . Fatigue 04/11/2018  . Acute deep vein thrombosis (DVT) of proximal vein of both lower extremities ([place]) 04/11/2018  . Moderate protein-calorie malnutrition ([place]) 03/22/2018  . Orthostasis 03/05/2018  . Dizziness 03/04/2018  . COPD (chronic obstructive pulmonary disease) ([place]) 12/13/2017  . Anxiety and depression 12/13/2017  . Trigeminal neuralgia 12/13/2017  . GERD (gastroesophageal reflux disease) 12/13/2017  . Diabetes mellitus, type 2 ([place]) 12/13/2017  . Constipation 12/13/2017  . Insomnia 12/13/2017  . Hypothyroidism 12/13/2017  . UTI (urinary tract infection) 10/08/2015  . Collapsed vertebra, not elsewhere classified, thoracic region, initial encounter for fracture ([place]) 09/20/2015  . Basal cell carcinoma of scalp 04/06/2014  . Fothergill's neuralgia 08/05/2013  . Bladder infection, chronic 02/11/2013  . Female genuine stress incontinence  02/11/2013  . Incomplete bladder emptying 02/11/2013  . Intrinsic sphincter deficiency 02/11/2013  . Mixed incontinence 02/11/2013  . Excessive urination at night 02/11/2013  . Bladder retention 02/11/2013  . FOM (frequency of micturition) 02/11/2013  . Basal cell carcinoma of face 09/13/2011   Social History   Tobacco Use  . Smoking status: Former Smoker    Packs/day: 0.50    Years: 20.00    Pack years: 10.00    Types: Cigarettes    Last attempt to quit: 09/20/1995    Years since quitting: 23.2  . Smokeless tobacco: Never Used  . Tobacco comment: quit 1996 smoked 20 years max 8 cig qd   Substance Use Topics  . Alcohol use: No   Review of Systems  Constitutional: Negative for chills, fatigue and fever.  HENT: Negative for congestion, ear pain, sinus pain and sore throat.   Eyes: Negative.   Respiratory: Negative for cough, shortness of breath and wheezing.   Cardiovascular: Negative for chest pain, palpitations and leg swelling.  Gastrointestinal: Negative for abdominal pain, diarrhea, nausea and vomiting.  Genitourinary: Negative for dysuria, frequency and urgency. +pressure when passing urine, "feels like something coming out of my vagina" Musculoskeletal: Negative for arthralgias and myalgias.  Skin: Negative for color change, pallor and rash.  Neurological: Negative for syncope, light-headedness and headaches.  Psychiatric/Behavioral: The patient is not nervous/anxious.       Objective:   Physical Exam Vitals signs and nursing note reviewed. Exam conducted with a chaperone present.  Constitutional:      General: She is not in acute distress.    Appearance: She is not ill-appearing, toxic-appearing or diaphoretic.  HENT:  Head: Normocephalic and atraumatic.  Neck:     Musculoskeletal: Neck supple.  Cardiovascular:     Rate and Rhythm: Normal rate and regular rhythm.  Pulmonary:     Effort: Pulmonary effort is normal. No respiratory distress.     Breath  sounds: Normal breath sounds.  Abdominal:     General: Abdomen is flat. Bowel sounds are normal. There is no distension.     Palpations: Abdomen is soft.     Tenderness: There is no abdominal tenderness. There is no guarding.     Hernia: No hernia is present. There is no hernia in the right inguinal area or left inguinal area.  Genitourinary:    General: Normal vulva.     Labia:        Right: No rash, tenderness, lesion or injury.        Left: No rash, tenderness, lesion or injury.      Urethra: No urethral pain.       Comments: +cystocele and rectocele, it is not pouching outside the vaginal opening Lymphadenopathy:     Cervical: No cervical adenopathy.     Lower Body: No right inguinal adenopathy. No left inguinal adenopathy.  Skin:    General: Skin is warm and dry.     Coloration: Skin is not pale.  Neurological:     Mental Status: She is alert and oriented to person, place, and time.     Comments: Walks with walker  Psychiatric:        Mood and Affect: Mood normal.        Behavior: Behavior normal.     Vitals:   12/26/18 1520  BP: 124/72  Pulse: 73  Resp: 20  Temp: 98.1 F (36.7 C)  SpO2: 93%       Assessment & Plan:    A total of 25  minutes were spent face-to-face with the patient during this encounter and over half of that time was spent on counseling and coordination of care. The patient was counseled on prolapse, causes, how to treat it, urine results.   Female genital prolapse/difficulty urinating- on exam I can see that patient does have weakening of the vaginal muscle wall causing some rectocele and cystocele.  This could create a feeling of pressure when urinating and explains the sensation of "something falling out of vagina" patient had.  Urinalysis overall is unremarkable, small leukocytes we will send to lab for culture.  Referral to urogynecology placed.  Patient given handout outlining information about genital prolapse.  Advised she does not hear  from someone about her referral in the next 7 days to call office and let us know.  Otherwise she can keep regularly scheduled follow-up with PCP as planned and return to clinic anytime if issues arise.

## 2018-12-26 NOTE — Telephone Encounter (Signed)
Caregiver, Arbie Cookey, with [facility], states pt. Told her yesterday that she feels "like something had dropped in her vagina." Denies any pain or difficulty voiding. No fever or vaginal discharge. Appointment made by agent for today.  Reason for Disposition . Tender lump (swelling or "ball") at vaginal opening  Answer Assessment - Initial Assessment Questions 1. SYMPTOM: "What's the main symptom you're concerned about?" (e.g., pain, itching, dryness)     Fells like "something dropped in my vagina." 2. LOCATION: "Where is the  outside located?" (e.g., inside/outside, left/right)     Outside 3. ONSET: "When did the  feeling  start?"     1 month ago 4. PAIN: "Is there any pain?" If so, ask: "How bad is it?" (Scale: 1-10; mild, moderate, severe)     No - irritating 5. ITCHING: "Is there any itching?" If so, ask: "How bad is it?" (Scale: 1-10; mild, moderate, severe)     No 6. CAUSE: "What do you think is causing the discharge?" "Have you had the same problem before? What happened then?"     No discharge 7. OTHER SYMPTOMS: "Do you have any other symptoms?" (e.g., fever, itching, vaginal bleeding, pain with urination, injury to genital area, vaginal foreign body)     No fever 8. PREGNANCY: "Is there any chance you are pregnant?" "When was your last menstrual period?"     No  Protocols used: VAGINAL [facility]

## 2018-12-26 NOTE — Telephone Encounter (Signed)
Patient's caregiver has been informed.

## 2018-12-27 ENCOUNTER — Telehealth: Payer: Self-pay | Admitting: Internal Medicine

## 2018-12-27 LAB — URINE CULTURE
MICRO NUMBER:: 95440
SPECIMEN QUALITY:: ADEQUATE

## 2018-12-27 NOTE — Telephone Encounter (Signed)
Advised pt to hold medication last night and check INR today   TMS

## 2018-12-27 NOTE — Telephone Encounter (Signed)
Delia, PSR with [facility] called to report the patient did a home test yesterday for INR and it was 4.7. I advised I will send to the provider.

## 2018-12-29 ENCOUNTER — Telehealth: Payer: Self-pay | Admitting: Internal Medicine

## 2018-12-29 NOTE — Telephone Encounter (Signed)
INR 2.4 12/27/2018   TMS

## 2019-01-01 ENCOUNTER — Encounter: Payer: Self-pay | Admitting: Internal Medicine

## 2019-01-01 LAB — PROTIME-INR

## 2019-01-09 ENCOUNTER — Other Ambulatory Visit: Payer: Self-pay | Admitting: Internal Medicine

## 2019-01-09 ENCOUNTER — Telehealth: Payer: Self-pay | Admitting: Internal Medicine

## 2019-01-09 DIAGNOSIS — R600 Localized edema: Secondary | ICD-10-CM

## 2019-01-09 DIAGNOSIS — E119 Type 2 diabetes mellitus without complications: Secondary | ICD-10-CM

## 2019-01-09 LAB — PROTIME-INR

## 2019-01-09 MED ORDER — FUROSEMIDE 20 MG PO TABS: 20.0000 mg | ORAL_TABLET | Freq: Every day | ORAL | 3 refills | Status: DC

## 2019-01-09 NOTE — Telephone Encounter (Signed)
INR 3.9 follow the protocol  Continue dose:   15 mg daily  except Mondaysand WEDNESDAYS andFridaysand Sundays 10 mg I am ok with INR 2-3.5  If above 3.5 hold the dose for the day  If below 2 take 1/2 of 1 mg pill so 0.5 mg as needed on day of low reading   [place]

## 2019-01-10 NOTE — Telephone Encounter (Signed)
Caregiver has been informed.

## 2019-01-13 ENCOUNTER — Ambulatory Visit: Payer: Medicare Other | Admitting: Podiatry

## 2019-01-13 ENCOUNTER — Encounter: Payer: Self-pay | Admitting: Podiatry

## 2019-01-13 DIAGNOSIS — M79676 Pain in unspecified toe(s): Secondary | ICD-10-CM

## 2019-01-13 DIAGNOSIS — B351 Tinea unguium: Secondary | ICD-10-CM | POA: Diagnosis not present

## 2019-01-13 DIAGNOSIS — D689 Coagulation defect, unspecified: Secondary | ICD-10-CM

## 2019-01-13 DIAGNOSIS — E1140 Type 2 diabetes mellitus with diabetic neuropathy, unspecified: Secondary | ICD-10-CM

## 2019-01-13 NOTE — Progress Notes (Signed)
Complaint:  Visit Type: Patient returns to my office for continued preventative foot care services. Complaint: Patient states" my nails have grown long and thick and become painful to walk and wear shoes" Patient has been diagnosed with DM with neuropathy. The patient presents for preventative foot care services. No changes to ROS.  Patient is taking coumadin.  Podiatric Exam: Vascular: dorsalis pedis and posterior tibial pulses are palpable bilateral. Capillary return is immediate. Temperature gradient is WNL. Skin turgor WNL  Sensorium: Diminished Semmes Weinstein monofilament test. Normal tactile sensation bilaterally. Nail Exam: Pt has thick disfigured discolored nails with subungual debris noted bilateral entire nail hallux through fifth toenails Ulcer Exam: There is no evidence of ulcer or pre-ulcerative changes or infection. Orthopedic Exam: Muscle tone and strength are WNL. No limitations in general ROM. No crepitus or effusions noted. Foot type and digits show no abnormalities. Bony prominences are unremarkable. Skin: No Porokeratosis. No infection or ulcers  Diagnosis:  Onychomycosis, , Pain in right toe, pain in left toes  Treatment & Plan Procedures and Treatment: Consent by patient was obtained for treatment procedures.   Debridement of mycotic and hypertrophic toenails, 1 through 5 bilateral and clearing of subungual debris. No ulceration, no infection noted.  Return Visit-Office Procedure: Patient instructed to return to the office for a follow up visit 3 months for continued evaluation and treatment.    Gardiner Barefoot DPM

## 2019-01-15 ENCOUNTER — Encounter: Payer: Self-pay | Admitting: Internal Medicine

## 2019-01-15 LAB — PROTIME-INR

## 2019-01-23 ENCOUNTER — Telehealth: Payer: Self-pay | Admitting: Internal Medicine

## 2019-01-23 NOTE — Telephone Encounter (Signed)
INR 2.0 today continue same dose as of 01/23/19   [place] [name]

## 2019-01-24 ENCOUNTER — Other Ambulatory Visit: Payer: Self-pay

## 2019-01-24 DIAGNOSIS — I824Y3 Acute embolism and thrombosis of unspecified deep veins of proximal lower extremity, bilateral: Secondary | ICD-10-CM

## 2019-01-28 ENCOUNTER — Other Ambulatory Visit: Payer: Self-pay

## 2019-01-28 ENCOUNTER — Encounter: Payer: Self-pay | Admitting: Oncology

## 2019-01-28 ENCOUNTER — Inpatient Hospital Stay: Payer: Medicare Other | Attending: Oncology

## 2019-01-28 ENCOUNTER — Inpatient Hospital Stay: Payer: Medicare Other | Admitting: Oncology

## 2019-01-28 VITALS — BP 132/75 | HR 67 | Temp 96.7°F | Resp 18 | Wt 187.9 lb

## 2019-01-28 DIAGNOSIS — E119 Type 2 diabetes mellitus without complications: Secondary | ICD-10-CM | POA: Diagnosis not present

## 2019-01-28 DIAGNOSIS — Z87891 Personal history of nicotine dependence: Secondary | ICD-10-CM

## 2019-01-28 DIAGNOSIS — Z86718 Personal history of other venous thrombosis and embolism: Secondary | ICD-10-CM | POA: Insufficient documentation

## 2019-01-28 DIAGNOSIS — I10 Essential (primary) hypertension: Secondary | ICD-10-CM | POA: Insufficient documentation

## 2019-01-28 DIAGNOSIS — Z7901 Long term (current) use of anticoagulants: Secondary | ICD-10-CM | POA: Diagnosis not present

## 2019-01-28 DIAGNOSIS — I824Y3 Acute embolism and thrombosis of unspecified deep veins of proximal lower extremity, bilateral: Secondary | ICD-10-CM

## 2019-01-28 DIAGNOSIS — Z79899 Other long term (current) drug therapy: Secondary | ICD-10-CM | POA: Diagnosis not present

## 2019-01-28 DIAGNOSIS — Z95828 Presence of other vascular implants and grafts: Secondary | ICD-10-CM

## 2019-01-28 DIAGNOSIS — I825Y3 Chronic embolism and thrombosis of unspecified deep veins of proximal lower extremity, bilateral: Secondary | ICD-10-CM

## 2019-01-28 LAB — CBC WITH DIFFERENTIAL/PLATELET
Abs Immature Granulocytes: 0.01 10*3/uL (ref 0.00–0.07)
Basophils Absolute: 0 10*3/uL (ref 0.0–0.1)
Basophils Relative: 1 %
Eosinophils Absolute: 0.2 10*3/uL (ref 0.0–0.5)
Eosinophils Relative: 3 %
HCT: 42.2 % (ref 36.0–46.0)
Hemoglobin: 13.4 g/dL (ref 12.0–15.0)
Immature Granulocytes: 0 %
Lymphocytes Relative: 27 %
Lymphs Abs: 1.5 10*3/uL (ref 0.7–4.0)
MCH: 29.1 pg (ref 26.0–34.0)
MCHC: 31.8 g/dL (ref 30.0–36.0)
MCV: 91.5 fL (ref 80.0–100.0)
Monocytes Absolute: 0.6 10*3/uL (ref 0.1–1.0)
Monocytes Relative: 11 %
Neutro Abs: 3.3 10*3/uL (ref 1.7–7.7)
Neutrophils Relative %: 58 %
Platelets: 191 10*3/uL (ref 150–400)
RBC: 4.61 MIL/uL (ref 3.87–5.11)
RDW: 16.2 % — ABNORMAL HIGH (ref 11.5–15.5)
WBC: 5.7 10*3/uL (ref 4.0–10.5)
nRBC: 0 % (ref 0.0–0.2)

## 2019-01-28 NOTE — Progress Notes (Signed)
Hematology/Oncology follow up note [facility] [phone] [phone]   Patient Care Team: McLean-Scocuzza, Nino Glow, MD as PCP - General (Internal Medicine)  REFERRING PROVIDER: McLean-Scocuzza, Nino Glow, MD REASON FOR VISIT Follow up for treatment of DVT  HISTORY OF PRESENTING ILLNESS:  Jessica Erickson is a  85 y.o.  female with PMH listed below who was referred to me for evaluation of DVT and chronic anticoagulation.  She has hard hearing and most history was obtained from her son and caregiver who accompanied her to clinic today.   Patient had a history of provoked right femoral and popliteal DVT and PE in 2016 after an episode of fall and back procedure. S/p IVC filter placed. She was on coumadin (19mg  daily per pcp) for anticoagulation for a period of time and eventually came off.  IVC filter was left in after a discussion between Dr.Dew and family, given her advanced age and multiple comorbidities.   Patient presented to ER for evaluation of a week of worsening LE swelling and pain.  03/29/2018 US showed extensive bilateral lower extremity DVT.  She was restarted on anticoagulation. Currently on Lovenox bridge to coumadin, per son with INR goal between 2.5-3.   04/04/2018 she had right upper extremity pain and swelling. US showed a subcutaneous soft tissue hematoma   INTERVAL HISTORY Jessica Erickson is a 85 y.o. female who has above history reviewed by me today presents for follow-up of DVT, on chronic anticoagulation.    Patient was accompanied by caregiver. Patient walks to the clinic with a walker.  She reports doing well at her baseline. Currently she is on Coumadin, dosing was managed by primary care physician. Denies any easy bruising, epistaxis, hematemesis or hematochezia. Appetite remains good.  Weight is stable.  Patient has chronic lower extremity swelling, Stable no calf tenderness. Patient has chronic hearing impairment.  Poor  historian.   Review of Systems  Constitutional: Negative for chills, fever, malaise/fatigue and weight loss.  HENT: Positive for hearing loss. Negative for sore throat.   Eyes: Negative for redness.  Respiratory: Negative for cough, shortness of breath and wheezing.   Cardiovascular: Negative for chest pain and palpitations.       Trace leg swelling  Gastrointestinal: Negative for abdominal pain, blood in stool, nausea and vomiting.  Genitourinary: Negative for dysuria.  Musculoskeletal: Negative for myalgias.  Skin: Negative for rash.  Neurological: Negative for dizziness, tingling and tremors.  Endo/Heme/Allergies: Bruises/bleeds easily.  Psychiatric/Behavioral: Negative for hallucinations.    MEDICAL HISTORY:  Past Medical History:  Diagnosis Date  . Acoustic neuroma ([place])   . Allergy   . Asthma   . Bilateral swelling of feet    and legs  . Bladder infection   . CAD (coronary artery disease)   . Cataract   . Change in voice   . Compression fracture of body of thoracic vertebra (HCC)    T12 09/18/15 MRI s/p fall   . Constipation   . COPD (chronic obstructive pulmonary disease) ([place])    previous CXR with chronic interstitial lung dz   . CVA (cerebral vascular accident) ([place])   . Depression   . Diabetes ([place])    with neuropathy  . Diabetes mellitus, type 2 ([place])   . Diarrhea   . Double vision   . DVT (deep venous thrombosis) ([place])    right leg 10/2015 was on coumadin off as of 2017/2018 ; s/p IVC filter  . Enuresis   . Eye pain,  right   . Fall   . Fatty liver    09/15/15 also mildly dilated pancreatitic duct rec MRCP small sub cm cyst hemangioma speeln mild right hydronephrorossi and prox. hydroureter, kidney stones, mild scarring kidneys  . Female stress incontinence   . Flank pain   . GERD (gastroesophageal reflux disease)    with small hiatal hernia   . Hard of hearing   . Heart disease   . History of kidney problems   . Hyperlipidemia    mixed  .  Hypertension   . Hypothyroidism, postsurgical   . Impaired mobility and ADLs    uses rolling walker has caretaker 24/7 at home  . Leg edema   . Mixed incontinence urge and stress (female)(female)   . Neuropathy   . Osteoarthritis    DDD spine   . Osteoporosis with fracture    T12 compression fracture  . Photophobia   . Pulmonary embolism ([place])    10/2015 off coumadin as of 04/2016  . Pulmonary HTN (HCC)    mild pulm HTN, echo 10/09/15 EF 97-58%ITGPQ 1 dd, RV systolic pressure increased   . Recurrent UTI   . Sinus pressure   . Skin cancer    BCC jawline and scalp   . Thyroid disease    follows [facility]  . TIA (transient ischemic attack)    MRI 2009/2010 neg stroke   . Trigeminal neuralgia    Dr. Tomi Bamberger s/p gamma knife x 2, on Tegretol since 2011/2012 no increase in dose >200 mg bid rec per family per neurology   . Urinary frequency   . Urinary, incontinence, stress female    Dr Erlene Quan urology     SURGICAL HISTORY: Past Surgical History:  Procedure Laterality Date  . APPENDECTOMY     as a child, open  . BRAIN SURGERY     schwnnoma removal 1996   . brain tumor surgery    . BREAST SURGERY     breast bx  . CATARACT EXTRACTION    . CHOLECYSTECTOMY    . EYE SURGERY     cataract  . IVC FILTER PLACEMENT ([facility] HX)     Dr. Lucky Cowboy 10/2015   . LAPAROSCOPIC TUBAL LIGATION    . MOHS SURGERY     scalp 04/2014   . PERIPHERAL VASCULAR CATHETERIZATION N/A 10/11/2015   Procedure: IVC Filter Insertion;  Surgeon: Algernon Huxley, MD;  Location: [facility];  Service: Cardiovascular;  Laterality: N/A;  . PERIPHERAL VASCULAR THROMBECTOMY Bilateral 03/29/2018   Procedure: PERIPHERAL VASCULAR THROMBECTOMY;  Surgeon: Algernon Huxley, MD;  Location: [facility];  Service: Cardiovascular;  Laterality: Bilateral;  . PUBOVAGINAL SLING    . THROAT SURGERY    . THYROID SURGERY     tumor around vocal cords   . TOOTH EXTRACTION     winter 2018   . TOTAL THYROIDECTOMY  1976     SOCIAL HISTORY: Social History   Socioeconomic History  . Marital status: Widowed    Spouse name: Not on file  . Number of children: 5  . Years of education: Not on file  . Highest education level: Not on file  Occupational History  . Occupation: retired  Scientific laboratory technician  . Financial resource strain: Not hard at all  . Food insecurity:    Worry: Never true    Inability: Never true  . Transportation needs:    Medical: No    Non-medical: No  Tobacco Use  . Smoking  status: Former Smoker    Packs/day: 0.50    Years: 20.00    Pack years: 10.00    Types: Cigarettes    Last attempt to quit: 09/20/1995    Years since quitting: 23.3  . Smokeless tobacco: Never Used  . Tobacco comment: quit 1996 smoked 20 years max 8 cig qd   Substance and Sexual Activity  . Alcohol use: No  . Drug use: No  . Sexual activity: Not on file  Lifestyle  . Physical activity:    Days per week: Not on file    Minutes per session: Not on file  . Stress: Not at all  Relationships  . Social connections:    Talks on phone: Not on file    Gets together: Not on file    Attends religious service: Not on file    Active member of club or organization: Not on file    Attends meetings of clubs or organizations: Not on file    Relationship status: Not on file  . Intimate partner violence:    Fear of current or ex partner: Not on file    Emotionally abused: Not on file    Physically abused: Not on file    Forced sexual activity: Not on file  Other Topics Concern  . Not on file  Social History Narrative   Lives at home with caretaker        FAMILY HISTORY: Family History  Problem Relation Age of Onset  . Heart disease Mother   . Diabetes Father   . Cancer Daughter        breast ca x 2 s/p mastectomy     ALLERGIES:  is allergic to penicillins; sulfa antibiotics; amitiza [lubiprostone]; aspirin; and penicillin g.  MEDICATIONS:  Current Outpatient Medications  Medication Sig Dispense Refill   . acetaminophen (TYLENOL) 325 MG tablet Take 2 tablets (650 mg total) by mouth every 6 (six) hours as needed. 120 tablet 11  . albuterol (PROVENTIL) (2.5 MG/3ML) 0.083% nebulizer solution Take 2.5 mg by nebulization 3 (three) times daily.    . AMBULATORY NON FORMULARY MEDICATION Medication Name: Please dispense flutter valve to use three times daily after nebulization. DX:J47.9 1 each 0  . atorvastatin (LIPITOR) 40 MG tablet Take 1 tablet (40 mg total) by mouth daily at 6 PM. Generic ok 90 tablet 3  . bisacodyl (DULCOLAX) 5 MG EC tablet Take 1 tablet (5 mg total) by mouth daily as needed for moderate constipation. 30 tablet 5  . budesonide-formoterol (SYMBICORT) 160-4.5 MCG/ACT inhaler Inhale 2 puffs into the lungs 2 (two) times daily. Rinse mouth 1 Inhaler 11  . Calcium Carbonate-Vitamin D (CALCIUM 600+D) 600-400 MG-UNIT tablet Take 1 tablet by mouth 2 (two) times daily. Lunch and dinner 90 tablet 3  . cetirizine (ZYRTEC) 10 MG tablet Take 10 mg by mouth daily.    Marland Kitchen docusate sodium (COLACE) 100 MG capsule Take 1 capsule (100 mg total) by mouth 2 (two) times daily. 180 capsule 3  . fluticasone (FLONASE) 50 MCG/ACT nasal spray Place 1-2 sprays into both nostrils daily. Max 2 sprays 16 g 11  . furosemide (LASIX) 20 MG tablet Take 1 tablet (20 mg total) by mouth daily. May take 2nd dose 20 mg if needed at lunch. Not reduced dose to use 1-2 x per day 180 tablet 3  . gabapentin (NEURONTIN) 100 MG capsule Take 1 capsule (100 mg total) by mouth 2 (two) times daily. 180 capsule 3  . guaiFENesin (MUCINEX) 600 MG  12 hr tablet Take 600 mg by mouth daily.     Marland Kitchen ipratropium-albuterol (DUONEB) 0.5-2.5 (3) MG/3ML SOLN     . levothyroxine (SYNTHROID, LEVOTHROID) 200 MCG tablet Take 1 tablet (200 mcg total) by mouth daily before breakfast. Except on Sunday. Do not take with other medications or vitamins 90 tablet 1  . Melatonin 10 MG TABS Take by mouth.     . mirtazapine (REMERON) 15 MG tablet Take 1 tablet (15 mg  total) by mouth at bedtime. 90 tablet 3  . Multiple Vitamin (MULTIVITAMIN WITH MINERALS) TABS tablet Take 1 tablet by mouth daily.    . ondansetron (ZOFRAN) 4 MG tablet Take 1 tablet (4 mg total) by mouth every 8 (eight) hours as needed for nausea or vomiting. 30 tablet 1  . pantoprazole (PROTONIX) 40 MG tablet Take 1 tablet (40 mg total) by mouth daily. 30 minutes before lunch or dinner 90 tablet 3  . polyethylene glycol powder (GLYCOLAX/MIRALAX) powder Take 17 g by mouth daily. 255 g 11  . polyvinyl alcohol (LIQUIFILM TEARS) 1.4 % ophthalmic solution Place 1 drop into both eyes as needed for dry eyes. 15 mL 11  . senna-docusate (SENOKOT-S) 8.6-50 MG tablet Take 1 tablet by mouth at bedtime as needed for mild constipation.    . sitaGLIPtin (JANUVIA) 50 MG tablet Take 1 tablet (50 mg total) by mouth daily. 90 tablet 3  . tiotropium (SPIRIVA) 18 MCG inhalation capsule Place 1 capsule (18 mcg total) into inhaler and inhale daily. 90 capsule 3  . warfarin (COUMADIN) 10 MG tablet Total 15 mg qpm all days except MWF take 16 mg (5mg + 10 mg +1 mg) 90 tablet 3  . warfarin (COUMADIN) 5 MG tablet Take with 15mg  qd (10+5 mg qd) other days 90 tablet 3  . warfarin (COUMADIN) 1 MG tablet Take 16 mg on MWF other days 15 mg total (Patient not taking: Reported on 01/28/2019) 120 tablet 3   No current facility-administered medications for this visit.      PHYSICAL EXAMINATION: ECOG PERFORMANCE STATUS: 1 - Symptomatic but completely ambulatory Vitals:   01/28/19 1031  BP: 132/75  Pulse: 67  Resp: 18  Temp: (!) 96.7 F (35.9 C)   Filed Weights   01/28/19 1031  Weight: 187 lb 14.4 oz (85.2 kg)    Physical Exam Constitutional:      General: She is not in acute distress.    Appearance: She is well-developed.     Comments: Frail appearance elderly female, walks with walker  HENT:     Head: Normocephalic and atraumatic.     Right Ear: External ear normal.     Left Ear: External ear normal.  Eyes:      General: No scleral icterus.    Conjunctiva/sclera: Conjunctivae normal.     Pupils: Pupils are equal, round, and reactive to light.     Comments: pale  Neck:     Musculoskeletal: Normal range of motion and neck supple.  Cardiovascular:     Rate and Rhythm: Normal rate and regular rhythm.     Heart sounds: Normal heart sounds.  Pulmonary:     Effort: Pulmonary effort is normal. No respiratory distress.     Breath sounds: Normal breath sounds. No wheezing or rales.  Chest:     Chest wall: No tenderness.  Abdominal:     General: Bowel sounds are normal. There is no distension.     Palpations: Abdomen is soft. There is no mass.  Tenderness: There is no abdominal tenderness.  Musculoskeletal: Normal range of motion.        General: No deformity.     Comments: Trace bilateral lower extremity edema  Lymphadenopathy:     Cervical: No cervical adenopathy.  Skin:    General: Skin is warm and dry.     Findings: No erythema or rash.     Comments: A few bruises on bilateral upper extremity.  Neurological:     Mental Status: She is alert and oriented to person, place, and time.     Cranial Nerves: No cranial nerve deficit.     Coordination: Coordination normal.  Psychiatric:        Behavior: Behavior normal.        Thought Content: Thought content normal.      LABORATORY DATA:  I have reviewed the data as listed Lab Results  Component Value Date   WBC 5.7 01/28/2019   HGB 13.4 01/28/2019   HCT 42.2 01/28/2019   MCV 91.5 01/28/2019   PLT 191 01/28/2019   Recent Labs    04/03/18 2149 04/06/18 0528 05/15/18 1116 05/22/18 1114 08/29/18 1139 12/11/18 1118  NA 137 140 139 138 141 143  K 3.9 3.7 4.1 3.8 4.3 3.7  CL 100* 106 103 102 102 104  CO2 29 28 26 27 31  33*  GLUCOSE 133* 119* 126* 113* 84 91  BUN 18 11 15 12 23 20   CREATININE 1.05* 0.85 1.05* 1.00 1.29* 1.21*  CALCIUM 7.9* 7.4* 8.7* 8.3* 9.4 8.9  GFRNONAA 46* 59* 45* 48*  --   --   GFRAA 53* >60 53* 56*  --   --    PROT 5.8*  --   --   --  7.4 6.6  ALBUMIN 2.6*  --   --   --  4.0 3.6  AST 36  --   --   --  20 21  ALT 19  --   --   --  18 15  ALKPHOS 69  --   --   --  62 52  BILITOT 0.4  --   --   --  0.5 0.4      ASSESSMENT & PLAN:  1. Chronic deep vein thrombosis (DVT) of proximal vein of both lower extremities (HCC)   2. Presence of IVC filter    #History of recurrent DVT, on chronic anticoagulation with Coumadin.  Previously discussed that patient will need long-term anticoagulation. Tolerates well.  Follows up with primary care physician for INR monitoring and Coumadin dosing. No active bleeding events. Previously Coumadin was the only option as patient is on carbamazepine for trigeminal neuralgia. Patient has hearing impairment which limits her communication. I called patient's daughter [name] who is a retired Marine scientist and discussed about Sales promotion account executive. Per daughter patient has been off carbamazepine after gamma knife radiosurgery for trigeminal neuralgia. Discussed with daughter that switching to DOACs can be an option or to continue current chronic anticoagulation with Coumadin. Eliquis 5 mg twice daily continue good option given her borderline kidney function.  It does not need INR monitoring which can be less stressful for patient.  Also bilateral lower extremity ultrasound can be repeated in the future and if no significant DVT burden, can consider maintenance dose at 2.5 mg twice daily. There is comparable bleeding risk associated with Eliquis as well as Coumadin. Coumadin has Antidote and Eliquis does not Daughter appreciate explanation and would like to discuss with patient's son who is a doctor and  update me about the plan. For now we will continue Coumadin. Orders Placed This Encounter  Procedures  . CBC with Differential/Platelet    Standing Status:   Future    Standing Expiration Date:   01/28/2020  . Comprehensive metabolic panel    Standing Status:   Future    Standing  Expiration Date:   01/28/2020    Patient can follow-up with me in 6 months with repeat labs.  Earlie Server, MD, PhD Hematology Oncology [facility] at [facility] Pager- [phone] 01/28/2019

## 2019-01-28 NOTE — Progress Notes (Signed)
Patient here for follow up. Very hard of hearing. Caregiver present with her.

## 2019-01-29 ENCOUNTER — Telehealth: Payer: Self-pay | Admitting: *Deleted

## 2019-01-29 ENCOUNTER — Telehealth: Payer: Self-pay | Admitting: Internal Medicine

## 2019-01-29 NOTE — Telephone Encounter (Signed)
INR 3.8 follow the protocol  HOLD Coumadin dose today  Monitor vitamin K foods she is eating  Check INR tomorrow    Continue dose:  15 mg daily  except Mondaysand WEDNESDAYS andFridaysand Sundays 10 mg I am ok with INR 2-3.5  If above 3.5 hold the dose for the day  If below 2 take 1/2 of 1 mg pill so 0.5 mg as needed on day of low reading  [place]

## 2019-01-29 NOTE — Telephone Encounter (Signed)
Patient and caregiver notified and voiced understanding,

## 2019-01-29 NOTE — Telephone Encounter (Signed)
MD-INR called with INR = 3.8 today.

## 2019-01-29 NOTE — Telephone Encounter (Signed)
INR report from [facility] of 3.8.  Call report to [place].  She will inform Dr Mclean-Scocuzza.

## 2019-01-29 NOTE — Telephone Encounter (Signed)
Duplicate

## 2019-01-29 NOTE — Telephone Encounter (Signed)
[name] called to report that she and her brother discussed her mothers anticoagulation and have decided to leave her on the Coumadin and NOT change to Eliquis

## 2019-01-30 ENCOUNTER — Telehealth: Payer: Self-pay | Admitting: Internal Medicine

## 2019-01-30 NOTE — Telephone Encounter (Signed)
INR 3.8 01/29/19 pt called  Today 01/30/19 INR 3.0 continue same dosing instructions as yesterday   [place]

## 2019-02-07 ENCOUNTER — Telehealth: Payer: Self-pay | Admitting: Internal Medicine

## 2019-02-07 ENCOUNTER — Other Ambulatory Visit: Payer: Self-pay | Admitting: Internal Medicine

## 2019-02-07 DIAGNOSIS — R600 Localized edema: Secondary | ICD-10-CM

## 2019-02-07 MED ORDER — FUROSEMIDE 20 MG PO TABS: 20.0000 mg | ORAL_TABLET | Freq: Every day | ORAL | 3 refills | Status: DC

## 2019-02-07 NOTE — Telephone Encounter (Signed)
INR 4.7  HOLD Coumadin dose today  Monitor vitamin K foods she is eating   Check INR tomorrow if >3 dont take Coumadin until normal INR 2-3  BUT  Continue dose:  15 mg daily  except Mondaysand WEDNESDAYS andFridaysand Sundays 10 mg I am ok with INR 2-3.5  If above 3.5 hold the dose for the day  If below 2 take 1/2 of 1 mg pill so 0.5 mg as needed on day of low reading  [place]

## 2019-02-07 NOTE — Telephone Encounter (Signed)
Patients caregiver [name] was informed of results.  Patients caregiver understood and no questions, comments, or concerns at this time.

## 2019-02-07 NOTE — Telephone Encounter (Signed)
Hinton Rao., CSR with [facility] called a report of INR 4.7, result read back and verified.

## 2019-02-08 LAB — POCT INR: INR: 2.8 — AB (ref 0.9–1.1)

## 2019-02-10 ENCOUNTER — Other Ambulatory Visit: Payer: Self-pay | Admitting: Internal Medicine

## 2019-02-10 DIAGNOSIS — J449 Chronic obstructive pulmonary disease, unspecified: Secondary | ICD-10-CM

## 2019-02-10 MED ORDER — TIOTROPIUM BROMIDE MONOHYDRATE 18 MCG IN CAPS: 18.0000 ug | ORAL_CAPSULE | Freq: Every day | RESPIRATORY_TRACT | 0 refills | Status: DC

## 2019-02-10 MED ORDER — BUDESONIDE-FORMOTEROL FUMARATE 160-4.5 MCG/ACT IN AERO: 2.0000 | INHALATION_SPRAY | Freq: Two times a day (BID) | RESPIRATORY_TRACT | 3 refills | Status: DC

## 2019-02-10 NOTE — Telephone Encounter (Signed)
Copied from [place] [id number]. Topic: Quick Communication - Rx Refill/Question >> Feb 10, 2019 10:14 AM Sheran Luz wrote: Medication: budesonide-formoterol (SYMBICORT) 160-4.5 MCG/ACT inhaler and tiotropium (SPIRIVA) 18 MCG inhalation capsule  Has the patient contacted their pharmacy? Yes, patient was advised to contact office.   Preferred Pharmacy (with phone number or street name):[facility], [place] - [address]  [phone] (Phone) [phone] (Fax)

## 2019-02-11 ENCOUNTER — Other Ambulatory Visit: Payer: Self-pay

## 2019-02-11 DIAGNOSIS — E039 Hypothyroidism, unspecified: Secondary | ICD-10-CM

## 2019-02-11 DIAGNOSIS — J449 Chronic obstructive pulmonary disease, unspecified: Secondary | ICD-10-CM

## 2019-02-11 MED ORDER — LEVOTHYROXINE SODIUM 200 MCG PO TABS: 200.0000 ug | ORAL_TABLET | Freq: Every day | ORAL | 1 refills | Status: DC

## 2019-02-11 MED ORDER — TIOTROPIUM BROMIDE MONOHYDRATE 18 MCG IN CAPS: 18.0000 ug | ORAL_CAPSULE | Freq: Every day | RESPIRATORY_TRACT | 0 refills | Status: DC

## 2019-02-11 MED ORDER — BUDESONIDE-FORMOTEROL FUMARATE 160-4.5 MCG/ACT IN AERO: 2.0000 | INHALATION_SPRAY | Freq: Two times a day (BID) | RESPIRATORY_TRACT | 12 refills | Status: DC

## 2019-02-12 ENCOUNTER — Other Ambulatory Visit: Payer: Self-pay

## 2019-02-14 ENCOUNTER — Other Ambulatory Visit: Payer: Self-pay

## 2019-02-14 DIAGNOSIS — E039 Hypothyroidism, unspecified: Secondary | ICD-10-CM

## 2019-02-14 MED ORDER — LEVOTHYROXINE SODIUM 200 MCG PO TABS: 200.0000 ug | ORAL_TABLET | Freq: Every day | ORAL | 1 refills | Status: DC

## 2019-02-19 ENCOUNTER — Telehealth: Payer: Self-pay | Admitting: Internal Medicine

## 2019-02-19 NOTE — Telephone Encounter (Signed)
inr 02/13/2019 2.0 continue same dose    Continue dose:  15 mg daily  except Mondaysand WEDNESDAYS andFridaysand Sundays 10 mg I am ok with INR 2-3.5  If above 3.5 hold the dose for the day  If below 2 take 1/2 of 1 mg pill so 0.5 mg as needed on day of low reading  Monitor diet   [place]

## 2019-02-20 ENCOUNTER — Telehealth: Payer: Self-pay | Admitting: Internal Medicine

## 2019-02-20 NOTE — Telephone Encounter (Signed)
INR 4.7  HOLD Coumadin dose today  Monitor vitamin K foods she is eating   Check INR tomorrowif >3 dont take Coumadin until normal INR 2-3  BUT  Continue dose:  15 mg daily  except Mondaysand WEDNESDAYS andFridaysand Sundays 10 mg  I am ok with INR 2-3.5   If above 3.5  -hold the dose for the day   If below 2 take 1/2 of 1 mg pill so 0.5 mg as needed on day of low reading

## 2019-02-20 NOTE — Telephone Encounter (Signed)
Patient was informed of results.  Patient understood and no questions, comments, or concerns at this time.  

## 2019-02-20 NOTE — Telephone Encounter (Signed)
'  Jessica Erickson' with [facility] IN[facility] calling to report INR 4.7, drawn today 02/20/2019. Called practice and spoke to Butch Penny; will route to provider.

## 2019-02-23 ENCOUNTER — Telehealth: Payer: Self-pay | Admitting: Internal Medicine

## 2019-02-23 NOTE — Telephone Encounter (Signed)
02/21/19 INR 2.5 fluctuating inr likely diet related Continue normal dose see last note    [place]

## 2019-02-27 ENCOUNTER — Other Ambulatory Visit: Payer: Medicare Other

## 2019-02-27 ENCOUNTER — Telehealth: Payer: Self-pay | Admitting: Internal Medicine

## 2019-02-27 ENCOUNTER — Ambulatory Visit (INDEPENDENT_AMBULATORY_CARE_PROVIDER_SITE_OTHER): Payer: Medicare Other | Admitting: Internal Medicine

## 2019-02-27 ENCOUNTER — Other Ambulatory Visit: Payer: Self-pay | Admitting: Internal Medicine

## 2019-02-27 ENCOUNTER — Other Ambulatory Visit: Payer: Self-pay

## 2019-02-27 DIAGNOSIS — N3000 Acute cystitis without hematuria: Secondary | ICD-10-CM

## 2019-02-27 MED ORDER — CIPROFLOXACIN HCL 250 MG PO TABS: 250.0000 mg | ORAL_TABLET | Freq: Two times a day (BID) | ORAL | 0 refills | Status: DC

## 2019-02-27 NOTE — Addendum Note (Signed)
Addended by: Leeanne Rio on: 02/27/2019 10:54 AM   Modules accepted: Orders

## 2019-02-27 NOTE — Telephone Encounter (Signed)
Copied from [place] [id number]. Topic: General - Other >> Feb 27, 2019  9:53 AM Gustavus Messing wrote: Reason for CRM: Patient's care giver called and stated that the patient is burning really badly in her private areas when she uses the bathroom. They are bring a urine specimen to the [place] office because the patient collected it without letting her care giver know. Was informed that she will need orders to get the Urine tested

## 2019-02-27 NOTE — Telephone Encounter (Signed)
Will charge as telephone visit   Ordered urine culture

## 2019-02-27 NOTE — Telephone Encounter (Signed)
INR 3.0 02/27/2019 continue same dose   TMS

## 2019-02-27 NOTE — Progress Notes (Signed)
Telephone note    Patient agreed to telephone visit and is aware that copayment and coinsurance may apply. Patient was treated using telemedicine according to accepted telemedicine protocols.  Subjective:   Patient complains of c/w UTI caretaker and patient at home c/o burning, increased urination, no urgency. Sx's started yesterday.  No fever, no confusion.   Provider at work   Patient Active Problem List   Diagnosis Date Noted  . Epistaxis 12/11/2018  . DVT (deep venous thrombosis) ([place]) 11/12/2018  . Bilateral leg edema 06/04/2018  . Hearing loss 06/04/2018  . Actinic keratosis 06/04/2018  . Bronchiectasis ([place]) 06/04/2018  . Depression 05/17/2018  . Dehydration 05/15/2018  . Hypertension 05/03/2018  . Lymphedema 05/02/2018  . Iron deficiency anemia 05/02/2018  . Physical deconditioning 05/02/2018  . Drug interaction 05/02/2018  . Supratherapeutic INR 05/02/2018  . Presence of IVC filter 05/02/2018  . Anemia 04/11/2018  . Fatigue 04/11/2018  . Acute deep vein thrombosis (DVT) of proximal vein of both lower extremities ([place]) 04/11/2018  . Moderate protein-calorie malnutrition ([place]) 03/22/2018  . Orthostasis 03/05/2018  . Dizziness 03/04/2018  . COPD (chronic obstructive pulmonary disease) ([place]) 12/13/2017  . Anxiety and depression 12/13/2017  . Trigeminal neuralgia 12/13/2017  . GERD (gastroesophageal reflux disease) 12/13/2017  . Diabetes mellitus, type 2 ([place]) 12/13/2017  . Constipation 12/13/2017  . Insomnia 12/13/2017  . Hypothyroidism 12/13/2017  . UTI (urinary tract infection) 10/08/2015  . Collapsed vertebra, not elsewhere classified, thoracic region, initial encounter for fracture ([place]) 09/20/2015  . Basal cell carcinoma of scalp 04/06/2014  . Fothergill's neuralgia 08/05/2013  . Bladder infection, chronic 02/11/2013  . Female genuine stress incontinence 02/11/2013  . Incomplete bladder emptying 02/11/2013  . Intrinsic sphincter deficiency 02/11/2013  .  Mixed incontinence 02/11/2013  . Excessive urination at night 02/11/2013  . Bladder retention 02/11/2013  . FOM (frequency of micturition) 02/11/2013  . Basal cell carcinoma of face 09/13/2011   Social History   Tobacco Use  . Smoking status: Former Smoker    Packs/day: 0.50    Years: 20.00    Pack years: 10.00    Types: Cigarettes    Last attempt to quit: 09/20/1995    Years since quitting: 23.4  . Smokeless tobacco: Never Used  . Tobacco comment: quit 1996 smoked 20 years max 8 cig qd   Substance Use Topics  . Alcohol use: No    Current Outpatient Medications:  .  acetaminophen (TYLENOL) 325 MG tablet, Take 2 tablets (650 mg total) by mouth every 6 (six) hours as needed., Disp: 120 tablet, Rfl: 11 .  albuterol (PROVENTIL) (2.5 MG/3ML) 0.083% nebulizer solution, Take 2.5 mg by nebulization 3 (three) times daily., Disp: , Rfl:  .  AMBULATORY NON FORMULARY MEDICATION, Medication Name: Please dispense flutter valve to use three times daily after nebulization. DX:J47.9, Disp: 1 each, Rfl: 0 .  atorvastatin (LIPITOR) 40 MG tablet, Take 1 tablet (40 mg total) by mouth daily at 6 PM. Generic ok, Disp: 90 tablet, Rfl: 3 .  bisacodyl (DULCOLAX) 5 MG EC tablet, Take 1 tablet (5 mg total) by mouth daily as needed for moderate constipation., Disp: 30 tablet, Rfl: 5 .  budesonide-formoterol (SYMBICORT) 160-4.5 MCG/ACT inhaler, Inhale 2 puffs into the lungs 2 (two) times daily. Rinse mouth, Disp: 1 Inhaler, Rfl: 12 .  Calcium Carbonate-Vitamin D (CALCIUM 600+D) 600-400 MG-UNIT tablet, Take 1 tablet by mouth 2 (two) times daily. Lunch and dinner, Disp: 90 tablet, Rfl: 3 .  cetirizine (ZYRTEC) 10 MG tablet, Take  10 mg by mouth daily., Disp: , Rfl:  .  ciprofloxacin (CIPRO) 250 MG tablet, Take 1 tablet (250 mg total) by mouth 2 (two) times daily., Disp: 10 tablet, Rfl: 0 .  docusate sodium (COLACE) 100 MG capsule, Take 1 capsule (100 mg total) by mouth 2 (two) times daily., Disp: 180 capsule, Rfl:  3 .  fluticasone (FLONASE) 50 MCG/ACT nasal spray, Place 1-2 sprays into both nostrils daily. Max 2 sprays, Disp: 16 g, Rfl: 11 .  furosemide (LASIX) 20 MG tablet, Take 1 tablet (20 mg total) by mouth daily. May take 2nd dose 20 mg if needed at lunch. Not reduced dose to use 1-2 x per day, Disp: 180 tablet, Rfl: 3 .  gabapentin (NEURONTIN) 100 MG capsule, Take 1 capsule (100 mg total) by mouth 2 (two) times daily., Disp: 180 capsule, Rfl: 3 .  guaiFENesin (MUCINEX) 600 MG 12 hr tablet, Take 600 mg by mouth daily. , Disp: , Rfl:  .  ipratropium-albuterol (DUONEB) 0.5-2.5 (3) MG/3ML SOLN, , Disp: , Rfl:  .  levothyroxine (SYNTHROID, LEVOTHROID) 200 MCG tablet, Take 1 tablet (200 mcg total) by mouth daily before breakfast. Except on Sunday. Do not take with other medications or vitamins, Disp: 90 tablet, Rfl: 1 .  Melatonin 10 MG TABS, Take by mouth. , Disp: , Rfl:  .  mirtazapine (REMERON) 15 MG tablet, Take 1 tablet (15 mg total) by mouth at bedtime., Disp: 90 tablet, Rfl: 3 .  Multiple Vitamin (MULTIVITAMIN WITH MINERALS) TABS tablet, Take 1 tablet by mouth daily., Disp: , Rfl:  .  ondansetron (ZOFRAN) 4 MG tablet, Take 1 tablet (4 mg total) by mouth every 8 (eight) hours as needed for nausea or vomiting., Disp: 30 tablet, Rfl: 1 .  pantoprazole (PROTONIX) 40 MG tablet, Take 1 tablet (40 mg total) by mouth daily. 30 minutes before lunch or dinner, Disp: 90 tablet, Rfl: 3 .  polyethylene glycol powder (GLYCOLAX/MIRALAX) powder, Take 17 g by mouth daily., Disp: 255 g, Rfl: 11 .  polyvinyl alcohol (LIQUIFILM TEARS) 1.4 % ophthalmic solution, Place 1 drop into both eyes as needed for dry eyes., Disp: 15 mL, Rfl: 11 .  senna-docusate (SENOKOT-S) 8.6-50 MG tablet, Take 1 tablet by mouth at bedtime as needed for mild constipation., Disp: , Rfl:  .  sitaGLIPtin (JANUVIA) 50 MG tablet, Take 1 tablet (50 mg total) by mouth daily., Disp: 90 tablet, Rfl: 3 .  tiotropium (SPIRIVA) 18 MCG inhalation capsule, Place  1 capsule (18 mcg total) into inhaler and inhale daily., Disp: 90 capsule, Rfl: 0 .  warfarin (COUMADIN) 1 MG tablet, Take 16 mg on MWF other days 15 mg total (Patient not taking: Reported on 01/28/2019), Disp: 120 tablet, Rfl: 3 .  warfarin (COUMADIN) 10 MG tablet, Total 15 mg qpm all days except MWF take 16 mg (5mg + 10 mg +1 mg), Disp: 90 tablet, Rfl: 3 .  warfarin (COUMADIN) 5 MG tablet, Take with 15mg  qd (10+5 mg qd) other days, Disp: 90 tablet, Rfl: 3  Allergies  Allergen Reactions  . Penicillins Shortness Of Breath, Rash and Other (See Comments)    Has patient had a PCN reaction causing immediate rash, facial/tongue/throat swelling, SOB or lightheadedness with hypotension: Yes Has patient had a PCN reaction causing severe rash involving mucus membranes or skin necrosis: No Has patient had a PCN reaction that required hospitalization No Has patient had a PCN reaction occurring within the last 10 years: No If all of the above answers are "NO", then  may proceed with Cephalosporin use.  . Sulfa Antibiotics Shortness Of Breath, Rash and Other (See Comments)  . Amitiza [Lubiprostone]     N/v/d  . Aspirin Other (See Comments)    Reaction:  Unknown  Other reaction(s): Bleeding (intolerance) Per patient " causes nose to bleed" Can take 81 mg daily without any complications Other reaction(s): "bloody nose"   . Penicillin G Other (See Comments)    Has patient had a PCN reaction causing immediate rash, facial/tongue/throat swelling, SOB or lightheadedness with hypotension: No Has patient had a PCN reaction causing severe rash involving mucus membranes or skin necrosis: Unknown Has patient had a PCN reaction that required hospitalization: Unknown Has patient had a PCN reaction occurring within the last 10 years: Unknown If all of the above answers are "NO", then may proceed with Cephalosporin use.    Assessment and Plan:   Diagnosis: c/w UTI  . Please see myChart communication and orders  below.   Plan cipro 250 mg bid x 5 days pt dropped UA and culture off today  Monitor inr on coumadin  Increase water intake   No orders of the defined types were placed in this encounter.  Meds ordered this encounter  Medications  . ciprofloxacin (CIPRO) 250 MG tablet    Sig: Take 1 tablet (250 mg total) by mouth 2 (two) times daily.    Dispense:  10 tablet    Refill:  0    Generic ok    Nino Glow McLean-Scocuzza, MD 02/27/2019  A total of 10 minutes were spent by me to personally review the patient-generated inquiry, review patient records and data pertinent to assessment of the patient's problem, develop a management plan including generation of prescriptions and/or orders, and on subsequent communication with the patient through secure the MyChart portal service.   There is no separately reported E/M service related to this service in the past 7 days nor does the patient have an upcoming soonest available appointment for this issue. This work was completed in less than 7 days.   The patient consented to this service today (see patient agreement prior to ongoing communication). Patient counseled regarding the need for in-person exam for certain conditions and was advised to call the office if any changing or worsening symptoms occur.   The codes to be used for the E/M service are: [x]   99421 for 5-10 minutes of time spent on the inquiry. []   [id number] for 11-20 minutes. []   [id number] for 21+ minutes.

## 2019-02-28 LAB — URINALYSIS, ROUTINE W REFLEX MICROSCOPIC
Bilirubin, UA: NEGATIVE
Glucose, UA: NEGATIVE
Ketones, UA: NEGATIVE
Nitrite, UA: NEGATIVE
Protein, UA: NEGATIVE
RBC, UA: NEGATIVE
Specific Gravity, UA: 1.011 (ref 1.005–1.030)
Urobilinogen, Ur: 0.2 mg/dL (ref 0.2–1.0)
pH, UA: 5.5 (ref 5.0–7.5)

## 2019-02-28 LAB — MICROSCOPIC EXAMINATION
Casts: NONE SEEN /lpf
WBC, UA: >30 /hpf — AB (ref 0–5)

## 2019-03-01 LAB — URINE CULTURE

## 2019-03-06 ENCOUNTER — Telehealth: Payer: Self-pay | Admitting: Internal Medicine

## 2019-03-06 NOTE — Telephone Encounter (Signed)
INR 2.5 today 03/06/2019   [place]

## 2019-03-11 DIAGNOSIS — W19XXXA Unspecified fall, initial encounter: Secondary | ICD-10-CM | POA: Insufficient documentation

## 2019-03-12 ENCOUNTER — Ambulatory Visit (INDEPENDENT_AMBULATORY_CARE_PROVIDER_SITE_OTHER): Payer: Medicare Other | Admitting: Internal Medicine

## 2019-03-12 ENCOUNTER — Telehealth: Payer: Self-pay | Admitting: Internal Medicine

## 2019-03-12 VITALS — BP 126/66 | Wt 188.0 lb

## 2019-03-12 DIAGNOSIS — Z95828 Presence of other vascular implants and grafts: Secondary | ICD-10-CM

## 2019-03-12 DIAGNOSIS — R300 Dysuria: Secondary | ICD-10-CM

## 2019-03-12 DIAGNOSIS — E119 Type 2 diabetes mellitus without complications: Secondary | ICD-10-CM | POA: Diagnosis not present

## 2019-03-12 DIAGNOSIS — W19XXXA Unspecified fall, initial encounter: Secondary | ICD-10-CM

## 2019-03-12 DIAGNOSIS — I10 Essential (primary) hypertension: Secondary | ICD-10-CM

## 2019-03-12 DIAGNOSIS — I82513 Chronic embolism and thrombosis of femoral vein, bilateral: Secondary | ICD-10-CM

## 2019-03-12 NOTE — Telephone Encounter (Signed)
Scheduled appointment 07-31-19 @11 :00AM

## 2019-03-12 NOTE — Progress Notes (Signed)
Telephone Note  I connected with Jessica Erickson on 03/12/19 at  1:30 PM EDT by telephone and verified that I am speaking with the correct person using two identifiers.  Location patient: home Location provider:[place] Persons participating in the virtual visit: patient, provider, pts caretaker Jessica Erickson   I discussed the limitations of evaluation and management by telemedicine and the availability of in person appointments. The patient expressed understanding and agreed to proceed.   HPI: 1. DM 2 A1C 12/11/2018 A1C 6.7 BP 03/11/2019 144/74 and today 126/66, weight is 188 lbs continues to take Januvia 50 mg qd  2. Fall yesterday w/o use of walker running from a bee in her garden no injury or pain but caretaker's back is hurting  3. H/o DVT on Coumadin last INR 03/06/2019 2.5 she has not checked INR this week and someone coming to look at her machine tomorrow  4. Dysuria resolved no UTI 02/27/2019 pt thinks was related to hygiene appt in [place] with urogyn 05/26/19 10 am   ROS: See pertinent positives and negatives per HPI.  Past Medical History:  Diagnosis Date  . Acoustic neuroma ([place])   . Allergy   . Asthma   . Bilateral swelling of feet    and legs  . Bladder infection   . CAD (coronary artery disease)   . Cataract   . Change in voice   . Compression fracture of body of thoracic vertebra (HCC)    T12 09/18/15 MRI s/p fall   . Constipation   . COPD (chronic obstructive pulmonary disease) ([place])    previous CXR with chronic interstitial lung dz   . CVA (cerebral vascular accident) ([place])   . Depression   . Diabetes ([place])    with neuropathy  . Diabetes mellitus, type 2 ([place])   . Diarrhea   . Double vision   . DVT (deep venous thrombosis) ([place])    right leg 10/2015 was on coumadin off as of 2017/2018 ; s/p IVC filter  . Enuresis   . Eye pain, right   . Fall   . Fatty liver    09/15/15 also mildly dilated pancreatitic duct rec MRCP small sub cm cyst hemangioma speeln mild right  hydronephrorossi and prox. hydroureter, kidney stones, mild scarring kidneys  . Female stress incontinence   . Flank pain   . GERD (gastroesophageal reflux disease)    with small hiatal hernia   . Hard of hearing   . Heart disease   . History of kidney problems   . Hyperlipidemia    mixed  . Hypertension   . Hypothyroidism, postsurgical   . Impaired mobility and ADLs    uses rolling walker has caretaker 24/7 at home  . Leg edema   . Mixed incontinence urge and stress (female)(female)   . Neuropathy   . Osteoarthritis    DDD spine   . Osteoporosis with fracture    T12 compression fracture  . Photophobia   . Pulmonary embolism ([place])    10/2015 off coumadin as of 04/2016  . Pulmonary HTN (HCC)    mild pulm HTN, echo 10/09/15 EF 51-02%HENID 1 dd, RV systolic pressure increased   . Recurrent UTI   . Sinus pressure   . Skin cancer    BCC jawline and scalp   . Thyroid disease    follows [facility]  . TIA (transient ischemic attack)    MRI 2009/2010 neg stroke   . Trigeminal neuralgia    Dr. Tomi Bamberger s/p gamma knife  x 2, on Tegretol since 2011/2012 no increase in dose >200 mg bid rec per family per neurology   . Urinary frequency   . Urinary, incontinence, stress female    Dr Erlene Quan urology     Past Surgical History:  Procedure Laterality Date  . APPENDECTOMY     as a child, open  . BRAIN SURGERY     schwnnoma removal 1996   . brain tumor surgery    . BREAST SURGERY     breast bx  . CATARACT EXTRACTION    . CHOLECYSTECTOMY    . EYE SURGERY     cataract  . IVC FILTER PLACEMENT ([facility] HX)     Dr. Lucky Cowboy 10/2015   . LAPAROSCOPIC TUBAL LIGATION    . MOHS SURGERY     scalp 04/2014   . PERIPHERAL VASCULAR CATHETERIZATION N/A 10/11/2015   Procedure: IVC Filter Insertion;  Surgeon: Algernon Huxley, MD;  Location: [place] [facility];  Service: Cardiovascular;  Laterality: N/A;  . PERIPHERAL VASCULAR THROMBECTOMY Bilateral 03/29/2018   Procedure: PERIPHERAL VASCULAR THROMBECTOMY;   Surgeon: Algernon Huxley, MD;  Location: [place] [facility];  Service: Cardiovascular;  Laterality: Bilateral;  . PUBOVAGINAL SLING    . THROAT SURGERY    . THYROID SURGERY     tumor around vocal cords   . TOOTH EXTRACTION     winter 2018   . TOTAL THYROIDECTOMY  1976    Family History  Problem Relation Age of Onset  . Heart disease Mother   . Diabetes Father   . Cancer Daughter        breast ca x 2 s/p mastectomy     SOCIAL HX: lives at home with 24/7 caretaker    Current Outpatient Medications:  .  acetaminophen (TYLENOL) 325 MG tablet, Take 2 tablets (650 mg total) by mouth every 6 (six) hours as needed., Disp: 120 tablet, Rfl: 11 .  albuterol (PROVENTIL) (2.5 MG/3ML) 0.083% nebulizer solution, Take 2.5 mg by nebulization 3 (three) times daily., Disp: , Rfl:  .  AMBULATORY NON FORMULARY MEDICATION, Medication Name: Please dispense flutter valve to use three times daily after nebulization. DX:J47.9, Disp: 1 each, Rfl: 0 .  atorvastatin (LIPITOR) 40 MG tablet, Take 1 tablet (40 mg total) by mouth daily at 6 PM. Generic ok, Disp: 90 tablet, Rfl: 3 .  bisacodyl (DULCOLAX) 5 MG EC tablet, Take 1 tablet (5 mg total) by mouth daily as needed for moderate constipation., Disp: 30 tablet, Rfl: 5 .  budesonide-formoterol (SYMBICORT) 160-4.5 MCG/ACT inhaler, Inhale 2 puffs into the lungs 2 (two) times daily. Rinse mouth, Disp: 1 Inhaler, Rfl: 12 .  Calcium Carbonate-Vitamin D (CALCIUM 600+D) 600-400 MG-UNIT tablet, Take 1 tablet by mouth 2 (two) times daily. Lunch and dinner, Disp: 90 tablet, Rfl: 3 .  cetirizine (ZYRTEC) 10 MG tablet, Take 10 mg by mouth daily., Disp: , Rfl:  .  docusate sodium (COLACE) 100 MG capsule, Take 1 capsule (100 mg total) by mouth 2 (two) times daily., Disp: 180 capsule, Rfl: 3 .  fluticasone (FLONASE) 50 MCG/ACT nasal spray, Place 1-2 sprays into both nostrils daily. Max 2 sprays, Disp: 16 g, Rfl: 11 .  furosemide (LASIX) 20 MG tablet, Take 1 tablet (20 mg total)  by mouth daily. May take 2nd dose 20 mg if needed at lunch. Not reduced dose to use 1-2 x per day, Disp: 180 tablet, Rfl: 3 .  gabapentin (NEURONTIN) 100 MG capsule, Take 1 capsule (100 mg total) by  mouth 2 (two) times daily., Disp: 180 capsule, Rfl: 3 .  guaiFENesin (MUCINEX) 600 MG 12 hr tablet, Take 600 mg by mouth daily. , Disp: , Rfl:  .  ipratropium-albuterol (DUONEB) 0.5-2.5 (3) MG/3ML SOLN, , Disp: , Rfl:  .  levothyroxine (SYNTHROID, LEVOTHROID) 200 MCG tablet, Take 1 tablet (200 mcg total) by mouth daily before breakfast. Except on Sunday. Do not take with other medications or vitamins, Disp: 90 tablet, Rfl: 1 .  Melatonin 10 MG TABS, Take by mouth. , Disp: , Rfl:  .  mirtazapine (REMERON) 15 MG tablet, Take 1 tablet (15 mg total) by mouth at bedtime., Disp: 90 tablet, Rfl: 3 .  Multiple Vitamin (MULTIVITAMIN WITH MINERALS) TABS tablet, Take 1 tablet by mouth daily., Disp: , Rfl:  .  ondansetron (ZOFRAN) 4 MG tablet, Take 1 tablet (4 mg total) by mouth every 8 (eight) hours as needed for nausea or vomiting., Disp: 30 tablet, Rfl: 1 .  pantoprazole (PROTONIX) 40 MG tablet, Take 1 tablet (40 mg total) by mouth daily. 30 minutes before lunch or dinner, Disp: 90 tablet, Rfl: 3 .  polyethylene glycol powder (GLYCOLAX/MIRALAX) powder, Take 17 g by mouth daily., Disp: 255 g, Rfl: 11 .  polyvinyl alcohol (LIQUIFILM TEARS) 1.4 % ophthalmic solution, Place 1 drop into both eyes as needed for dry eyes., Disp: 15 mL, Rfl: 11 .  senna-docusate (SENOKOT-S) 8.6-50 MG tablet, Take 1 tablet by mouth at bedtime as needed for mild constipation., Disp: , Rfl:  .  sitaGLIPtin (JANUVIA) 50 MG tablet, Take 1 tablet (50 mg total) by mouth daily., Disp: 90 tablet, Rfl: 3 .  tiotropium (SPIRIVA) 18 MCG inhalation capsule, Place 1 capsule (18 mcg total) into inhaler and inhale daily., Disp: 90 capsule, Rfl: 0 .  warfarin (COUMADIN) 1 MG tablet, Take 16 mg on MWF other days 15 mg total (Patient not taking: Reported on  01/28/2019), Disp: 120 tablet, Rfl: 3 .  warfarin (COUMADIN) 10 MG tablet, Total 15 mg qpm all days except MWF take 16 mg (76m+ 10 mg +1 mg), Disp: 90 tablet, Rfl: 3 .  warfarin (COUMADIN) 5 MG tablet, Take with 179mqd (10+5 mg qd) other days, Disp: 90 tablet, Rfl: 3  EXAM: telephone  VITALS per patient if applicable:  GENERAL: alert, oriented, appears well and in no acute distress  HEENT: hoarseness of voice this is not new per pt and not worsening, hard of hearing   PSYCH/NEURO: pleasant and cooperative, no obvious depression or anxiety, speech and thought processing grossly intact  ASSESSMENT AND PLAN:  Discussed the following assessment and plan:  1. Type 2 diabetes mellitus without complication, without long-term current use of insulin (HCC) A1C 6.7 continue medications  -check at f/u  -BP today 126/66 weight 188  -need to check urine microAlb at f/u  -saw podiatry 01/13/2019   2. Fall, initial encounter no injury   3. Essential hypertension -no meds for now ACEI stopped month ago due to hypotension  4. Chronic deep vein thrombosis (DVT) of femoral vein of both lower extremities (HCC) s/p IVC filter on coumadin last INR 03/06/2019 2.5 will check when machine gets fixed  5. Dysuria culture negative 02/27/2019 [facility]rogyn appt [facility] 05/26/2019 10 am Dr, CoLelon Huh 6. HM Flupt utd pna 23 utd prevnar had 09/20/16 Tdap 08/04/16 shingrixnot had out of stock called pharmacy,? zostavaxif had MMR immune  saw dermatology appt 09/02/18  Out of age window pap, colonoscopy, mammo DEXA 01/01/08 osteopeniawill make sure on ca  600 bid and vit D 1000 iu qd -Vitamin d 03/15/18 48.1   I discussed the assessment and treatment plan with the patient. The patient was provided an opportunity to ask questions and all were answered. The patient agreed with the plan and demonstrated an understanding of the instructions.   The patient was advised to call back or seek an in-person  evaluation if the symptoms worsen or if the condition fails to improve as anticipated.  I provided 15 minutes of non-face-to-face time during this encounter.   Nino Glow McLean-Scocuzza, MD

## 2019-03-12 NOTE — Telephone Encounter (Signed)
Sch in office f/u 07/2019

## 2019-03-12 NOTE — Patient Instructions (Signed)
I will see you for follow up 07/31/2019 at 11 am Take care and be safe  Dr. Olivia Mackie McLean-Scocuzza   Jessica Erickson for diabetes=Sitagliptin oral tablet What is this medicine? SITAGLIPTIN (sit a GLIP tin) helps to treat type 2 diabetes. It helps to control blood sugar. Treatment is combined with diet and exercise. This medicine may be used for other purposes; ask your health care provider or pharmacist if you have questions. COMMON BRAND NAME(S): Januvia What should I tell my health care provider before I take this medicine? They need to know if you have any of these conditions: -diabetic ketoacidosis -kidney disease -pancreatitis -previous swelling of the tongue, face, or lips with difficulty breathing, difficulty swallowing, hoarseness, or tightening of the throat -type 1 diabetes -an unusual or allergic reaction to sitagliptin, other medicines, foods, dyes, or preservatives -pregnant or trying to get pregnant -breast-feeding How should I use this medicine? Take this medicine by mouth with a glass of water. Follow the directions on the prescription label. You can take it with or without food. Do not cut, crush or chew this medicine. Take your dose at the same time each day. Do not take more often than directed. Do not stop taking except on your doctor's advice. A special MedGuide will be given to you by the pharmacist with each prescription and refill. Be sure to read this information carefully each time. Talk to your pediatrician regarding the use of this medicine in children. Special care may be needed. Overdosage: If you think you have taken too much of this medicine contact a poison control center or emergency room at once. NOTE: This medicine is only for you. Do not share this medicine with others. What if I miss a dose? If you miss a dose, take it as soon as you can. If it is almost time for your next dose, take only that dose. Do not take double or extra doses. What may interact with this  medicine? Do not take this medicine with any of the following medications: -gatifloxacin This medicine may also interact with the following medications: -alcohol -digoxin -insulin -sulfonylureas like glimepiride, glipizide, glyburide This list may not describe all possible interactions. Give your health care provider a list of all the medicines, herbs, non-prescription drugs, or dietary supplements you use. Also tell them if you smoke, drink alcohol, or use illegal drugs. Some items may interact with your medicine. What should I watch for while using this medicine? Visit your doctor or health care professional for regular checks on your progress. A test called the HbA1C (A1C) will be monitored. This is a simple blood test. It measures your blood sugar control over the last 2 to 3 months. You will receive this test every 3 to 6 months. Learn how to check your blood sugar. Learn the symptoms of low and high blood sugar and how to manage them. Always carry a quick-source of sugar with you in case you have symptoms of low blood sugar. Examples include hard sugar candy or glucose tablets. Make sure others know that you can choke if you eat or drink when you develop serious symptoms of low blood sugar, such as seizures or unconsciousness. They must get medical help at once. Tell your doctor or health care professional if you have high blood sugar. You might need to change the dose of your medicine. If you are sick or exercising more than usual, you might need to change the dose of your medicine. Do not skip meals. Ask your doctor  or health care professional if you should avoid alcohol. Many nonprescription cough and cold products contain sugar or alcohol. These can affect blood sugar. Wear a medical ID bracelet or chain, and carry a card that describes your disease and details of your medicine and dosage times. What side effects may I notice from receiving this medicine? Side effects that you should  report to your doctor or health care professional as soon as possible: -allergic reactions like skin rash, itching or hives, swelling of the face, lips, or tongue -breathing problems -general ill feeling or flu-like symptoms -joint pain -loss of appetite -redness, blistering, peeling or loosening of the skin, including inside the mouth -signs and symptoms of heart failure like breathing problems, fast, irregular heartbeat, sudden weight gain; swelling of the ankles, feet, hands; unusually weak or tired -signs and symptoms of low blood sugar such as feeling anxious, confusion, dizziness, increased hunger, unusually weak or tired, sweating, shakiness, cold, irritable, headache, blurred vision, fast heartbeat, loss of consciousness -signs and symptoms of muscle injury like dark urine; trouble passing urine or change in the amount of urine; unusually weak or tired; muscle pain; back pain -unusual stomach upset or pain -vomiting Side effects that usually do not require medical attention (report to your doctor or health care professional if they continue or are bothersome): -diarrhea -headache -sore throat -stomach upset -stuffy or runny nose This list may not describe all possible side effects. Call your doctor for medical advice about side effects. You may report side effects to FDA at 1-800-FDA-1088. Where should I keep my medicine? Keep out of the reach of children. Store at room temperature between 15 and 30 degrees C (59 and 86 degrees F). Throw away any unused medicine after the expiration date. NOTE: This sheet is a summary. It may not cover all possible information. If you have questions about this medicine, talk to your doctor, pharmacist, or health care provider.  2019 Elsevier/Gold Standard (2018-06-25 16:38:43)

## 2019-03-19 ENCOUNTER — Telehealth: Payer: Self-pay | Admitting: Internal Medicine

## 2019-03-19 NOTE — Telephone Encounter (Signed)
inr 1.7 03/19/2019  Has she missed a dose ?  Continue dose:  15 mg daily  except Mondaysand WEDNESDAYS andFridaysand Sundays 10 mg  I am ok with INR 2-3.5   If above 3.5  -hold the dose for the day   If below 2 take 1/2 of 1 mg pill so 0.5 mg as needed on day of low reading     Please check INR tomorrow 03/20/2019    Jessica Erickson

## 2019-03-20 ENCOUNTER — Other Ambulatory Visit: Payer: Self-pay | Admitting: Internal Medicine

## 2019-03-20 ENCOUNTER — Ambulatory Visit: Payer: Self-pay | Admitting: Internal Medicine

## 2019-03-20 DIAGNOSIS — G500 Trigeminal neuralgia: Secondary | ICD-10-CM

## 2019-03-20 MED ORDER — ACETAMINOPHEN-CODEINE #3 300-30 MG PO TABS: 1.0000 | ORAL_TABLET | Freq: Two times a day (BID) | ORAL | 0 refills | Status: DC | PRN

## 2019-03-20 NOTE — Telephone Encounter (Signed)
Pt.'s daughter, Jessica Erickson, reports pt. Started having her trigeminal neuralgia pain again. Started when she took a warm shower and the water triggered it. Pain is 8/10 and causes pt. To yell out. Her caregiver has given her Tylenol and it has not helped. They are currently trying a cold cloth. Daughter is asking if pt. Could have some Tylenol with codeine sent to her pharmacy. States she has had this in the past and it helped. Pt. Is not able to do a virtual visit without a great deal of assistance, per daughter, because of her speech and hearing difficulties. Please call daughter, Jessica Erickson. Contact number [phone].   Answer Assessment - Initial Assessment Questions 1. ONSET: "When did the pain start?" (e.g., minutes, hours, days)     Started this morning 2. ONSET: "Does the pain come and go, or has it been constant since it started?" (e.g., constant, intermittent, fleeting)     Comes and goes 3. SEVERITY: "How bad is the pain?"   (Scale 1-10; mild, moderate or severe)   - MILD (1-3): doesn't interfere with normal activities    - MODERATE (4-7): interferes with normal activities or awakens from sleep    - SEVERE (8-10): excruciating pain, unable to do any normal activities      8-10 4. LOCATION: "Where does it hurt?"      Right side of face 5. RASH: "Is there any redness, rash, or swelling of the face?"     No 6. FEVER: "Do you have a fever?" If so, ask: "What is it, how was it measured, and when did it start?"      No 7. OTHER SYMPTOMS: "Do you have any other symptoms?" (e.g., fever, toothache, nasal discharge, nasal congestion, clicking sensation in jaw joint)     No 8. PREGNANCY: "Is there any chance you are pregnant?" "When was your last menstrual period?"     No  Protocols used: FACE PAIN-A-AH

## 2019-03-20 NOTE — Telephone Encounter (Signed)
Patient caregiver betty was informed of results.  Caregiver understood and no questions, comments, or concerns at this time. Patient did not skip dosage.

## 2019-03-20 NOTE — Telephone Encounter (Signed)
Call daughter [name]  -sent Tylenol #3 with codeine can only given 10 pills with new narcotic law  They also need to call their doctor at [facility] who manages trigeminal neuralgia  Take care and be safe   [place]

## 2019-03-20 NOTE — Telephone Encounter (Signed)
Jessica Erickson was informed of results.  betty understood and no questions, comments, or concerns at this time.

## 2019-03-21 ENCOUNTER — Telehealth: Payer: Self-pay | Admitting: Internal Medicine

## 2019-03-21 NOTE — Telephone Encounter (Signed)
inr 2.1 today 03/21/2019   [place]

## 2019-03-27 ENCOUNTER — Telehealth: Payer: Self-pay | Admitting: Internal Medicine

## 2019-03-27 LAB — PROTIME-INR: INR: 2.4 — AB (ref 0.9–1.1)

## 2019-03-27 NOTE — Telephone Encounter (Signed)
INR 2.4 03/27/2019  Continue same dose    TMS

## 2019-04-03 ENCOUNTER — Telehealth: Payer: Self-pay | Admitting: *Deleted

## 2019-04-03 NOTE — Telephone Encounter (Signed)
MD-INR- out of range reading-  INR- 1.7   4/30

## 2019-04-03 NOTE — Telephone Encounter (Signed)
INR 1.7 04/03/2019  If below 2 take 1/2 of 1 mg pill so 0.5 mg as needed on day of low reading  Continue dose:  15 mg daily  except Mondaysand WEDNESDAYS andFridaysand Sundays 10 mg  I am ok with INR 2-3.5   If above 3.5 -hold the dose for the day    Please check INR tomorrow 04/04/2019

## 2019-04-04 ENCOUNTER — Telehealth: Payer: Self-pay

## 2019-04-04 NOTE — Telephone Encounter (Signed)
INR 1.6 04/04/2019   If below 2 take 1/2 of 1 mg pill so 0.5 mg as needed on day of low reading  Continue dose:  15 mg daily  except Mondaysand WEDNESDAYS andFridaysand Sundays 10 mg  I am ok with INR 2-3.5   If above 3.5 -hold the dose for the day    Please check INR tomorrow Monday 04/07/2019

## 2019-04-04 NOTE — Telephone Encounter (Signed)
Info has been given to [place].

## 2019-04-04 NOTE — Telephone Encounter (Signed)
Called and spoke with the caregiver [name] and gave her instructions for coumadin dose.  Nina,cma

## 2019-04-04 NOTE — Telephone Encounter (Signed)
Jessica Erickson already spoke with betty (caregiver) today at 8 am (see previous note)  and informed her to recheck Inr today and change coumadin dose to .5mg  of the 1 mg tab. Rechecked today and results was 1.6.

## 2019-04-07 LAB — PROTIME-INR: INR: 2.2 — AB (ref 0.9–1.1)

## 2019-04-08 ENCOUNTER — Telehealth: Payer: Self-pay | Admitting: Internal Medicine

## 2019-04-08 NOTE — Telephone Encounter (Signed)
04/07/2019 INR 2.2   TMS

## 2019-04-14 ENCOUNTER — Encounter: Payer: Self-pay | Admitting: Podiatry

## 2019-04-14 ENCOUNTER — Ambulatory Visit: Payer: Medicare Other | Admitting: Podiatry

## 2019-04-14 ENCOUNTER — Other Ambulatory Visit: Payer: Self-pay

## 2019-04-14 VITALS — Temp 96.6°F

## 2019-04-14 DIAGNOSIS — D689 Coagulation defect, unspecified: Secondary | ICD-10-CM | POA: Diagnosis not present

## 2019-04-14 DIAGNOSIS — L84 Corns and callosities: Secondary | ICD-10-CM | POA: Diagnosis not present

## 2019-04-14 DIAGNOSIS — B351 Tinea unguium: Secondary | ICD-10-CM

## 2019-04-14 DIAGNOSIS — E1140 Type 2 diabetes mellitus with diabetic neuropathy, unspecified: Secondary | ICD-10-CM

## 2019-04-14 DIAGNOSIS — M79676 Pain in unspecified toe(s): Secondary | ICD-10-CM

## 2019-04-14 NOTE — Progress Notes (Addendum)
Complaint:  Visit Type: Patient returns to my office for continued preventative foot care services. Complaint: Patient states" my nails have grown long and thick and become painful to walk and wear shoes" Patient has been diagnosed with DM with neuropathy. The patient presents for preventative foot care services. No changes to ROS.  Patient is taking coumadin.  Podiatric Exam: Vascular: dorsalis pedis and posterior tibial pulses are palpable bilateral. Capillary return is immediate. Temperature gradient is WNL. Skin turgor WNL  Sensorium: Diminished Semmes Weinstein monofilament test. Normal tactile sensation bilaterally. Nail Exam: Pt has thick disfigured discolored nails with subungual debris noted bilateral entire nail hallux through fifth toenails Ulcer Exam: There is no evidence of ulcer or pre-ulcerative changes or infection. Orthopedic Exam: Muscle tone and strength are WNL. No limitations in general ROM. No crepitus or effusions noted. Foot type and digits show no abnormalities. HAV  B/L. Skin: No Porokeratosis. No infection or ulcers.  Callus sub 1st MPJ right foot.  Diagnosis:  Onychomycosis, , Pain in right toe, pain in left toes Debride callus.  Treatment & Plan Procedures and Treatment: Consent by patient was obtained for treatment procedures.   Debridement of mycotic and hypertrophic toenails, 1 through 5 bilateral and clearing of subungual debris. No ulceration, no infection noted.  Patient has inflamed big toe joint clinically but no pain in the joint.  Pain  Is present  under big toe joint at site of callus. Return Visit-Office Procedure: Patient instructed to return to the office for a follow up visit 3 months for continued evaluation and treatment.    Gardiner Barefoot DPM

## 2019-04-15 ENCOUNTER — Telehealth: Payer: Self-pay | Admitting: Internal Medicine

## 2019-04-15 NOTE — Telephone Encounter (Signed)
Left message for patient to return call back. [facility] may give results and obtain information.  

## 2019-04-15 NOTE — Telephone Encounter (Signed)
INR 1.6 04/15/2019  If below 2 take 1/2 of 1 mg pill so 0.5 mg as needed on day of low reading  Continue dose:  15 mg daily  except Mondaysand WEDNESDAYS andFridaysand Sundays 10 mg  I am ok with INR 2-3.5   If above 3.5 -hold the dose for the day    Please check INR tomorrowMonday 04/16/2019

## 2019-04-15 NOTE — Telephone Encounter (Signed)
rec'd INR results from Maudie Mercury, at [facility]; INR 1.6 on 04/14/19.  Will forward this note to the provider for further recommendations.

## 2019-04-15 NOTE — Telephone Encounter (Signed)
Jessica Erickson is with patient today and they are returning the call- they were at the dentist earlier.- Recommendation for dosing read to caregiver and she wrote it down for herself and patient.

## 2019-04-18 ENCOUNTER — Encounter: Payer: Self-pay | Admitting: Internal Medicine

## 2019-04-18 ENCOUNTER — Telehealth: Payer: Self-pay | Admitting: Internal Medicine

## 2019-04-18 NOTE — Telephone Encounter (Signed)
INR 0.8-did she miss any doses ?   04/18/2019    If INR below 2 take 1/2 of 1 mg pill so 0.5 mg as needed on day of low readingwith your regular dose so today is Friday she should take 10.5 mg   Check INR Saturday and if <2-3 take an extra 0.5 dose and repeat INR on Monday     Regular dose Continue dose:  15 mg daily  except Mondaysand WEDNESDAYS andFridaysand Sundays 10 mg  I am ok with INR 2-3.5   If above 3.5 -hold the dose for the day    Please check INR tomorrowMonday 04/16/2019

## 2019-04-18 NOTE — Telephone Encounter (Signed)
Pt has not had missed any doses.  Jessica Erickson was notified and understood directions.

## 2019-04-24 ENCOUNTER — Telehealth: Payer: Self-pay | Admitting: Internal Medicine

## 2019-04-24 NOTE — Telephone Encounter (Signed)
Pt's caregiver advised and voiced understanding.

## 2019-04-24 NOTE — Telephone Encounter (Signed)
INR 3.5 on 04/24/2019   Ok with this continue same dose   [place]

## 2019-04-24 NOTE — Telephone Encounter (Signed)
INR updates: 5/21 INR vaule 3.5  Pat from MD INR  [phone]

## 2019-05-02 ENCOUNTER — Telehealth: Payer: Self-pay | Admitting: Internal Medicine

## 2019-05-02 NOTE — Telephone Encounter (Signed)
Happy Belated Birthday from Dr. Gayland Curry and Fransisco Beau  INR 1.1 on 05/02/2019 -did she miss any doses ?      If INR below 2.0 take 1/2 of 1 mg pill so 0.5 mg as needed on day of low readingwith your regular dose so today is Friday she should take 10.5 mg   Check INR Saturday and Monday and if <2-3 take an extra 0.5 dose (1/2 of 1 mg pill) and repeat INR on Monday    Regular dose Continue dose:  15 mg daily  except Mondaysand WEDNESDAYS andFridaysand Sundays 10 mg  I am ok with INR 2-3.5   If above 3.5 -hold the dose for the day    Please check INR tomorrowMonday 04/16/2019

## 2019-05-02 NOTE — Telephone Encounter (Signed)
Care giver informed.  Patient has not missed any doses

## 2019-05-03 ENCOUNTER — Telehealth: Payer: Self-pay | Admitting: Family Medicine

## 2019-05-03 NOTE — Telephone Encounter (Signed)
MD INR called to inform that pt's INR today was 1.1  Reviewed note in chart from PCP.   Instructed caregiver to give additional 0.5mg  dose as per directions (like was done yesterday).  Caregiver expressed understanding.

## 2019-05-05 ENCOUNTER — Telehealth: Payer: Self-pay

## 2019-05-05 NOTE — Telephone Encounter (Signed)
Left message for patient to return call back. [facility] may give results and obtain information.  

## 2019-05-05 NOTE — Telephone Encounter (Signed)
Call pt she should check INR today   Thanks [place]

## 2019-05-05 NOTE — Telephone Encounter (Signed)
INR 1.1 today and over the weekend   IfINRbelow 2.0 take 1 mg pill (increased from 0.5 mg) as needed on day of low readingwith your regular dose so today is Monday I.e she should take 11 mg (normal dose on Monday 10 mg plus 1 mg pill extra dose) due to INR being low for the day  Check INR Tuesday and if <2-3 take an extra 1 mg dose (1 whole pill NOT a 0.5 mg dose) and repeat INR the next day   Regular doseContinue dose:  15 mg daily  except Mondaysand WEDNESDAYS andFridaysand Sundays 10 mg  I am ok with INR 2-3.5   If above 3.5 -hold the dose for the day and check INR the next day in the am    Please check INR tomorrowTuesday 05/06/2019

## 2019-05-06 ENCOUNTER — Telehealth: Payer: Self-pay

## 2019-05-06 ENCOUNTER — Telehealth: Payer: Self-pay | Admitting: Internal Medicine

## 2019-05-06 ENCOUNTER — Other Ambulatory Visit: Payer: Self-pay | Admitting: Internal Medicine

## 2019-05-06 DIAGNOSIS — K219 Gastro-esophageal reflux disease without esophagitis: Secondary | ICD-10-CM

## 2019-05-06 MED ORDER — PANTOPRAZOLE SODIUM 40 MG PO TBEC: 40.0000 mg | DELAYED_RELEASE_TABLET | Freq: Every day | ORAL | 3 refills | Status: DC

## 2019-05-06 NOTE — Telephone Encounter (Signed)
INR 1.1 05/03/2019 and 0.9 on 05/05/2019  IfINRbelow 2.0take 1 mg pill (increased from 0.5 mg) as needed on day of low readingwith your regular dose so today is Monday I.e she should take 11 mg (normal dose on Monday 10 mg plus 1 mg pill extra dose) due to INR being low for the day  Tuesday she would take 15 mg plus 1 mg=16 mg if her INR is <3.0  Check INR the next day (s) if <2-3 take an extra 1 mg dose (1 whole pill NOT a 0.5 mg dose)and repeat INR the next day always    Regular doseContinue dose:  15 mg daily  except Mondaysand WEDNESDAYS andFridaysand Sundays 10 mg  I am ok with INR 2-3.5   If above 3.5 -hold the dose for the day and check INR the next day in the am    Please check INR tomorrowwednesday 05/07/2019

## 2019-05-06 NOTE — Telephone Encounter (Signed)
Patients caregivier has been informed.

## 2019-05-07 ENCOUNTER — Telehealth: Payer: Self-pay | Admitting: Internal Medicine

## 2019-05-07 ENCOUNTER — Encounter: Payer: Self-pay | Admitting: Internal Medicine

## 2019-05-07 ENCOUNTER — Other Ambulatory Visit: Payer: Self-pay | Admitting: Internal Medicine

## 2019-05-07 ENCOUNTER — Telehealth: Payer: Self-pay | Admitting: *Deleted

## 2019-05-07 DIAGNOSIS — I824Y3 Acute embolism and thrombosis of unspecified deep veins of proximal lower extremity, bilateral: Secondary | ICD-10-CM

## 2019-05-07 DIAGNOSIS — R600 Localized edema: Secondary | ICD-10-CM

## 2019-05-07 NOTE — Addendum Note (Signed)
Addended by: Orland Mustard on: 05/07/2019 05:44 PM   Modules accepted: Orders

## 2019-05-07 NOTE — Telephone Encounter (Signed)
Will order DVT b/l legs  Informed Daughter nancy since INR low   TMS

## 2019-05-07 NOTE — Telephone Encounter (Signed)
INR 05/07/2019 1.0   Legs swollen and purple will do [place] to r/o DVT with subtherapeutic INR   TMS

## 2019-05-07 NOTE — Telephone Encounter (Signed)
Copied from [place] [id number]. Topic: General - Other >> May 07, 2019  3:19 PM Carolyn Stare wrote:  Pt daughter [name] call to ask if  Dr Aundra Dubin think it is ok for pt to make office visits, she has a gyn appt that has been cancel a few times due to Covid, duaghter is Johney Frame if she think it's ok for pt

## 2019-05-07 NOTE — Telephone Encounter (Signed)
INR is 1.0 and both feet swelling very bad and turn purple.Marland Kitchen

## 2019-05-07 NOTE — Telephone Encounter (Signed)
Call Jessica Erickson Jessica Erickson' daughter  She has an appt 05/26/19  She can go and wear a mask covering nose and mouth if she can tolerate it being on 100% of the time to GYN appt in [facility]   She can move appt back if not urgent not sure when this COVID 19 will be over   Also am earliest morning appts would be better   [place]

## 2019-05-08 ENCOUNTER — Ambulatory Visit
Admission: RE | Admit: 2019-05-08 | Discharge: 2019-05-08 | Disposition: A | Discharge status: Home / Self Care | Payer: Medicare Other | Source: Ambulatory Visit | Attending: Internal Medicine | Admitting: Internal Medicine

## 2019-05-08 ENCOUNTER — Telehealth: Payer: Self-pay | Admitting: *Deleted

## 2019-05-08 ENCOUNTER — Encounter (INDEPENDENT_AMBULATORY_CARE_PROVIDER_SITE_OTHER): Payer: Self-pay

## 2019-05-08 ENCOUNTER — Ambulatory Visit: Payer: Medicare Other

## 2019-05-08 ENCOUNTER — Other Ambulatory Visit: Payer: Self-pay

## 2019-05-08 DIAGNOSIS — R600 Localized edema: Secondary | ICD-10-CM | POA: Insufficient documentation

## 2019-05-08 DIAGNOSIS — I824Y3 Acute embolism and thrombosis of unspecified deep veins of proximal lower extremity, bilateral: Secondary | ICD-10-CM | POA: Diagnosis present

## 2019-05-08 NOTE — Telephone Encounter (Signed)
Copied from [place] [phone]. Topic: General - Other >> May 08, 2019  3:02 PM Celene Kras A wrote: Reason for CRM: Anderson Malta, from [facility], called with test results to rule out blood clots and it was negative. Callback # [phone]

## 2019-05-08 NOTE — Telephone Encounter (Signed)
Copied from [place] [phone]. Topic: General - Inquiry >> May 07, 2019  4:07 PM Richardo Priest, NT wrote: Reason for CRM: MD INR called in to inform Dr.Tracy that patient's INR results for today were 1.0 and that the results have also been faxed.

## 2019-05-09 ENCOUNTER — Telehealth: Payer: Self-pay | Admitting: *Deleted

## 2019-05-09 ENCOUNTER — Encounter: Payer: Self-pay | Admitting: Internal Medicine

## 2019-05-09 NOTE — Telephone Encounter (Signed)
IfINRbelow 2.0take1 mg pill as needed on day of low readingwith your regular dose so Monday to Friday if dose is less than 2.0 you would take 15 mg plus 1 mg (16 mg total for the day) if you INR is less than 2.0   Saturday and Sunday you would take 11 mg If your INR is less than 2.0   Always check INRthe next day (s) if your INR is too low or too high  Change dose 05/09/2019 15 mg daily Monday to Friday  except Saturday to Sunday 10 mg daily   I am ok with INR 2-3.5   If INR above 3.5 -hold the dose for the dayand check INR the next day in the am

## 2019-05-09 NOTE — Telephone Encounter (Signed)
I have given info to morgan.

## 2019-05-09 NOTE — Telephone Encounter (Signed)
Copied from [place] [phone]. Topic: General - Other >> May 09, 2019  8:00 AM Jodie Echevaria wrote: Reason for CRM: Lilia Pro with [facility] called to ask for Dr Dannial Monarch nurse to give her a call back so they can discuss and compare the patients medication list to make sure patient is getting her correct medications. Please call Lilia Pro Ph# [phone]

## 2019-05-15 ENCOUNTER — Telehealth: Payer: Self-pay

## 2019-05-15 ENCOUNTER — Telehealth: Payer: Self-pay | Admitting: Internal Medicine

## 2019-05-15 NOTE — Telephone Encounter (Signed)
Copied from [place] [id number]. Topic: General - Inquiry >> May 15, 2019 10:15 AM Scherrie Gerlach wrote: Reason for CRM: Merrilee Seashore with [facility] states they need virtual visit or face to face in office for the home health referral.he is going to send someone to the home and connect through an [place], but will call and set up tele visit after he speaks with the pt.

## 2019-05-15 NOTE — Telephone Encounter (Signed)
What is her INR  [place]

## 2019-05-15 NOTE — Telephone Encounter (Signed)
FYI

## 2019-05-16 ENCOUNTER — Other Ambulatory Visit: Payer: Self-pay | Admitting: Internal Medicine

## 2019-05-16 DIAGNOSIS — E119 Type 2 diabetes mellitus without complications: Secondary | ICD-10-CM

## 2019-05-16 MED ORDER — ATORVASTATIN CALCIUM 40 MG PO TABS: 40.0000 mg | ORAL_TABLET | Freq: Every day | ORAL | 3 refills | Status: DC

## 2019-05-16 NOTE — Telephone Encounter (Signed)
They are unable to do the test. The company has to give more test strips. They have already contacted the company

## 2019-05-21 ENCOUNTER — Telehealth: Payer: Self-pay | Admitting: Internal Medicine

## 2019-05-21 ENCOUNTER — Ambulatory Visit (INDEPENDENT_AMBULATORY_CARE_PROVIDER_SITE_OTHER): Payer: Medicare Other | Admitting: Internal Medicine

## 2019-05-21 ENCOUNTER — Encounter: Payer: Self-pay | Admitting: Internal Medicine

## 2019-05-21 ENCOUNTER — Other Ambulatory Visit: Payer: Self-pay

## 2019-05-21 ENCOUNTER — Other Ambulatory Visit: Payer: Self-pay | Admitting: Internal Medicine

## 2019-05-21 VITALS — BP 136/76 | HR 72 | Temp 98.4°F | Ht 67.0 in | Wt 194.2 lb

## 2019-05-21 DIAGNOSIS — I82513 Chronic embolism and thrombosis of femoral vein, bilateral: Secondary | ICD-10-CM | POA: Diagnosis not present

## 2019-05-21 DIAGNOSIS — G500 Trigeminal neuralgia: Secondary | ICD-10-CM | POA: Diagnosis not present

## 2019-05-21 DIAGNOSIS — E119 Type 2 diabetes mellitus without complications: Secondary | ICD-10-CM

## 2019-05-21 DIAGNOSIS — E039 Hypothyroidism, unspecified: Secondary | ICD-10-CM | POA: Diagnosis not present

## 2019-05-21 LAB — TSH: TSH: 2.26 u[IU]/mL (ref 0.35–4.50)

## 2019-05-21 LAB — PROTIME-INR
INR: 5.9 ratio (ref 0.8–1.0)
Prothrombin Time: 66.9 s (ref 9.6–13.1)

## 2019-05-21 LAB — COMPREHENSIVE METABOLIC PANEL
ALT: 17 U/L (ref 0–35)
AST: 21 U/L (ref 0–37)
Albumin: 3.9 g/dL (ref 3.5–5.2)
Alkaline Phosphatase: 66 U/L (ref 39–117)
BUN: 21 mg/dL (ref 6–23)
CO2: 33 mEq/L — ABNORMAL HIGH (ref 19–32)
Calcium: 9 mg/dL (ref 8.4–10.5)
Chloride: 103 mEq/L (ref 96–112)
Creatinine, Ser: 1.41 mg/dL — ABNORMAL HIGH (ref 0.40–1.20)
GFR: 34.95 mL/min — ABNORMAL LOW (ref >60.00)
Glucose, Bld: 105 mg/dL — ABNORMAL HIGH (ref 70–99)
Potassium: 4.4 mEq/L (ref 3.5–5.1)
Sodium: 144 mEq/L (ref 135–145)
Total Bilirubin: 0.4 mg/dL (ref 0.2–1.2)
Total Protein: 6.6 g/dL (ref 6.0–8.3)

## 2019-05-21 LAB — HEMOGLOBIN A1C: Hgb A1c MFr Bld: 6.4 % (ref 4.6–6.5)

## 2019-05-21 NOTE — Addendum Note (Signed)
Addended by: Orland Mustard on: 05/21/2019 05:52 PM   Modules accepted: Orders

## 2019-05-21 NOTE — Telephone Encounter (Signed)
Critical results called by Santiago Glad at [facility]. Protime-INR 66.9 INR 5.9 calling results now to pcp.

## 2019-05-21 NOTE — Telephone Encounter (Signed)
TC to on call physician, Dr. Derrel Nip with critical values Protime-INR 66.9 INR 5.9. Orders to suspend tonight's coumadin dose and tomorrow's dose. Caregiver, [name] understands and repeated this to me. [name] is to call in to PCP tomorrow for further information regarding any changes needed to coumadin dosage. Routing to PCP.

## 2019-05-21 NOTE — Patient Instructions (Addendum)
We will check your INR coumadin numbner today  Please use your flutter valve daily   You can take up to 2 pills of lasix 20 mg daily   Follow up in 3-4 months  Please wear your mask  Trigeminal Neuralgia  Trigeminal neuralgia is a nerve disorder that causes attacks of severe facial pain. The attacks last from a few seconds to several minutes. They can happen for days, weeks, or months and then go away for months or years. Trigeminal neuralgia is also called tic douloureux. What are the causes? This condition is caused by damage to a nerve in the face that is called the trigeminal nerve. An attack can be triggered by:  Talking.  Chewing.  Putting on makeup.  Washing your face.  Shaving your face.  Brushing your teeth.  Touching your face. What increases the risk? This condition is more likely to develop in:  Women.  People who are 18 years of age or older. What are the signs or symptoms? The main symptom of this condition is pain in the jaw, lips, eyes, nose, scalp, forehead, and face. The pain may be intense, stabbing, electric, or shock-like. How is this diagnosed? This condition is diagnosed with a physical exam. A CT scan or MRI may be done to rule out other conditions that can cause facial pain. How is this treated? This condition may be treated with:  Avoiding the things that trigger your attacks.  Pain medicine.  Surgery. This may be done in severe cases if other medical treatment does not provide relief. Follow these instructions at home:  Take over-the-counter and prescription medicines only as told by your health care provider.  If you wish to get pregnant, talk with your health care provider before you start trying to get pregnant.  Avoid the things that trigger your attacks. It may help to: ? Chew on the unaffected side of your mouth. ? Avoid touching your face. ? Avoid blasts of hot or cold air. Contact a health care provider if:  Your pain  medicine is not helping.  You develop new, unexplained symptoms, such as: ? Double vision. ? Facial weakness. ? Changes in hearing or balance.  You become pregnant. Get help right away if:  Your pain is unbearable, and your pain medicine does not help. This information is not intended to replace advice given to you by your health care provider. Make sure you discuss any questions you have with your health care provider. Document Released: 11/17/2000 Document Revised: 10/19/2017 Document Reviewed: 03/15/2015 Elsevier Interactive Patient Education  2019 [employer].

## 2019-05-21 NOTE — Progress Notes (Signed)
Chief Complaint  Patient presents with  . Follow-up   F/u with caretaker [name] pt is HOH 1. DM 2 on januvia 50 mg qd  2. Variable INR with h/o DVT will check INR today caretaker betty needs more help instruction at home with using PT/INR machine and also dosing of coumadin or following instructions  -will CC this note to H/H [facility] RN 3. H/o trigeminal neuralgia right sided with pain severe Thursday -Tues prior to visit but better now she is no longer on tegretol per neurology at [facility]  4. hypothyrodism on levo 200 mg daily except sundays  Review of Systems  Constitutional: Negative for weight loss.  HENT: Negative for hearing loss.   Eyes: Negative for blurred vision.  Respiratory: Negative for shortness of breath.   Cardiovascular: Negative for chest pain.  Gastrointestinal: Negative for abdominal pain.  Musculoskeletal: Negative for falls.  Skin: Negative for rash.  Neurological: Negative for headaches.  Psychiatric/Behavioral: Negative for depression.   Past Medical History:  Diagnosis Date  . Acoustic neuroma ([place])   . Allergy   . Asthma   . Bilateral swelling of feet    and legs  . Bladder infection   . CAD (coronary artery disease)   . Cataract   . Change in voice   . Compression fracture of body of thoracic vertebra (HCC)    T12 09/18/15 MRI s/p fall   . Constipation   . COPD (chronic obstructive pulmonary disease) ([place])    previous CXR with chronic interstitial lung dz   . CVA (cerebral vascular accident) ([place])   . Depression   . Diabetes ([place])    with neuropathy  . Diabetes mellitus, type 2 ([place])   . Diarrhea   . Double vision   . DVT (deep venous thrombosis) ([place])    right leg 10/2015 was on coumadin off as of 2017/2018 ; s/p IVC filter  . Enuresis   . Eye pain, right   . Fall   . Fatty liver    09/15/15 also mildly dilated pancreatitic duct rec MRCP small sub cm cyst hemangioma speeln mild right hydronephrorossi and prox. hydroureter, kidney stones,  mild scarring kidneys  . Female stress incontinence   . Flank pain   . GERD (gastroesophageal reflux disease)    with small hiatal hernia   . Hard of hearing   . Heart disease   . History of kidney problems   . Hyperlipidemia    mixed  . Hypertension   . Hypothyroidism, postsurgical   . Impaired mobility and ADLs    uses rolling walker has caretaker 24/7 at home  . Leg edema   . Mixed incontinence urge and stress (female)(female)   . Neuropathy   . Osteoarthritis    DDD spine   . Osteoporosis with fracture    T12 compression fracture  . Photophobia   . Pulmonary embolism ([place])    10/2015 off coumadin as of 04/2016  . Pulmonary HTN (HCC)    mild pulm HTN, echo 10/09/15 EF 27-07%EMLJQ 1 dd, RV systolic pressure increased   . Recurrent UTI   . Sinus pressure   . Skin cancer    BCC jawline and scalp   . Thyroid disease    follows [facility]  . TIA (transient ischemic attack)    MRI 2009/2010 neg stroke   . Trigeminal neuralgia    Dr. Tomi Bamberger s/p gamma knife x 2, on Tegretol since 2011/2012 no increase in dose >200 mg bid rec per  family per neurology   . Urinary frequency   . Urinary, incontinence, stress female    Dr Erlene Quan urology    Past Surgical History:  Procedure Laterality Date  . APPENDECTOMY     as a child, open  . BRAIN SURGERY     schwnnoma removal 1996   . brain tumor surgery    . BREAST SURGERY     breast bx  . CATARACT EXTRACTION    . CHOLECYSTECTOMY    . EYE SURGERY     cataract  . IVC FILTER PLACEMENT ([facility] HX)     Dr. Lucky Cowboy 10/2015   . LAPAROSCOPIC TUBAL LIGATION    . MOHS SURGERY     scalp 04/2014   . PERIPHERAL VASCULAR CATHETERIZATION N/A 10/11/2015   Procedure: IVC Filter Insertion;  Surgeon: Algernon Huxley, MD;  Location: [facility];  Service: Cardiovascular;  Laterality: N/A;  . PERIPHERAL VASCULAR THROMBECTOMY Bilateral 03/29/2018   Procedure: PERIPHERAL VASCULAR THROMBECTOMY;  Surgeon: Algernon Huxley, MD;  Location: [facility];  Service: Cardiovascular;  Laterality: Bilateral;  . PUBOVAGINAL SLING    . THROAT SURGERY    . THYROID SURGERY     tumor around vocal cords   . TOOTH EXTRACTION     winter 2018   . TOTAL THYROIDECTOMY  1976   Family History  Problem Relation Age of Onset  . Heart disease Mother   . Diabetes Father   . Cancer Daughter        breast ca x 2 s/p mastectomy    Social History   Socioeconomic History  . Marital status: Widowed    Spouse name: Not on file  . Number of children: 5  . Years of education: Not on file  . Highest education level: Not on file  Occupational History  . Occupation: retired  Scientific laboratory technician  . Financial resource strain: Not hard at all  . Food insecurity    Worry: Never true    Inability: Never true  . Transportation needs    Medical: No    Non-medical: No  Tobacco Use  . Smoking status: Former Smoker    Packs/day: 0.50    Years: 20.00    Pack years: 10.00    Types: Cigarettes    Quit date: 09/20/1995    Years since quitting: 23.6  . Smokeless tobacco: Never Used  . Tobacco comment: quit 1996 smoked 20 years max 8 cig qd   Substance and Sexual Activity  . Alcohol use: No  . Drug use: No  . Sexual activity: Not on file  Lifestyle  . Physical activity    Days per week: Not on file    Minutes per session: Not on file  . Stress: Not at all  Relationships  . Social Herbalist on phone: Not on file    Gets together: Not on file    Attends religious service: Not on file    Active member of club or organization: Not on file    Attends meetings of clubs or organizations: Not on file    Relationship status: Not on file  . Intimate partner violence    Fear of current or ex partner: Not on file    Emotionally abused: Not on file    Physically abused: Not on file    Forced sexual activity: Not on file  Other Topics Concern  . Not on file  Social History Narrative   Lives at home with  caretaker currently Sharee Pimple and Inez Catalina           No outpatient medications have been marked as taking for the 05/21/19 encounter (Office Visit) with McLean-Scocuzza, Nino Glow, MD.   Allergies  Allergen Reactions  . Penicillins Shortness Of Breath, Rash and Other (See Comments)    Has patient had a PCN reaction causing immediate rash, facial/tongue/throat swelling, SOB or lightheadedness with hypotension: Yes Has patient had a PCN reaction causing severe rash involving mucus membranes or skin necrosis: No Has patient had a PCN reaction that required hospitalization No Has patient had a PCN reaction occurring within the last 10 years: No If all of the above answers are "NO", then may proceed with Cephalosporin use.  . Sulfa Antibiotics Shortness Of Breath, Rash and Other (See Comments)  . Amitiza [Lubiprostone]     N/v/d  . Aspirin Other (See Comments)    Reaction:  Unknown  Other reaction(s): Bleeding (intolerance) Per patient " causes nose to bleed" Can take 81 mg daily without any complications Other reaction(s): "bloody nose"   . Penicillin G Other (See Comments)    Has patient had a PCN reaction causing immediate rash, facial/tongue/throat swelling, SOB or lightheadedness with hypotension: No Has patient had a PCN reaction causing severe rash involving mucus membranes or skin necrosis: Unknown Has patient had a PCN reaction that required hospitalization: Unknown Has patient had a PCN reaction occurring within the last 10 years: Unknown If all of the above answers are "NO", then may proceed with Cephalosporin use.   Recent Results (from the past 2160 hour(s))  Urinalysis, Routine w reflex microscopic     Status: Abnormal   Collection Time: 02/27/19 10:55 AM  Result Value Ref Range   Specific Gravity, UA 1.011 1.005 - 1.030   pH, UA 5.5 5.0 - 7.5   Color, UA Yellow Yellow   Appearance Ur Cloudy (A) Clear   Leukocytes, UA 2+ (A) Negative   Protein, UA Negative Negative/Trace   Glucose, UA Negative Negative   Ketones, UA  Negative Negative   RBC, UA Negative Negative   Bilirubin, UA Negative Negative   Urobilinogen, Ur 0.2 0.2 - 1.0 mg/dL   Nitrite, UA Negative Negative   Microscopic Examination See below:     Comment: Microscopic was indicated and was performed.  Urine Culture     Status: None   Collection Time: 02/27/19 10:55 AM   Specimen: Urine   URINE  Result Value Ref Range   Urine Culture, Routine Final report    Organism ID, Bacteria Comment     Comment: Mixed urogenital flora Less than 10,000 colonies/mL   Microscopic Examination     Status: Abnormal   Collection Time: 02/27/19 10:55 AM   URINE  Result Value Ref Range   WBC, UA >30 (A) 0 - 5 /hpf   RBC, UA 0-2 0 - 2 /hpf   Epithelial Cells (non renal) 0-10 0 - 10 /hpf   Casts None seen None seen /lpf   Mucus, UA Present Not Estab.   Bacteria, UA Few None seen/Few  Protime-INR     Status: Abnormal   Collection Time: 03/27/19 12:00 AM  Result Value Ref Range   INR 2.4 (A) 0.9 - 1.1  Protime-INR     Status: Abnormal   Collection Time: 04/07/19 12:00 AM  Result Value Ref Range   INR 2.2 (A) 0.9 - 1.1   Objective  Body mass index is 30.42 kg/m. Wt Readings from Last 3 Encounters:  05/21/19  194 lb 3.2 oz (88.1 kg)  03/12/19 188 lb (85.3 kg)  01/28/19 187 lb 14.4 oz (85.2 kg)   Temp Readings from Last 3 Encounters:  05/21/19 98.4 F (36.9 C) (Oral)  04/14/19 (!) 96.6 F (35.9 C)  01/28/19 (!) 96.7 F (35.9 C) (Tympanic)   BP Readings from Last 3 Encounters:  05/21/19 136/76  03/12/19 126/66  01/28/19 132/75   Pulse Readings from Last 3 Encounters:  05/21/19 72  01/28/19 67  12/26/18 73    Physical Exam Vitals signs and nursing note reviewed.  Constitutional:      Appearance: Normal appearance. She is well-developed and well-groomed. She is obese.  HENT:     Head: Normocephalic and atraumatic.     Ears:     Comments: Hard of hearing only 12% hearing in right ear     Nose: Nose normal.     Mouth/Throat:      Mouth: Mucous membranes are moist.     Pharynx: Oropharynx is clear.  Eyes:     Conjunctiva/sclera: Conjunctivae normal.     Pupils: Pupils are equal, round, and reactive to light.  Cardiovascular:     Rate and Rhythm: Normal rate and regular rhythm.     Heart sounds: Normal heart sounds.  Pulmonary:     Effort: Pulmonary effort is normal.     Breath sounds: Normal breath sounds.  Skin:    General: Skin is warm and dry.     Findings: Bruising present.     Comments: 1 to 2+ edema b/l legs Bruising to b/l hands   Neurological:     General: No focal deficit present.     Mental Status: She is alert and oriented to person, place, and time. Mental status is at baseline.     Comments: Using rollator   Psychiatric:        Attention and Perception: Attention and perception normal.        Mood and Affect: Mood and affect normal.        Speech: Speech normal.        Behavior: Behavior normal. Behavior is cooperative.        Thought Content: Thought content normal.        Cognition and Memory: Cognition and memory normal.        Judgment: Judgment normal.     Assessment   1. DM 2 A1C 6.7 12/11/2018  2. Variable INR with h/o DVT  Current Coumadin dose  Ifyour INRis below 2.0take1 mg pill (as needed) on the day of your low readingwith your regular dose so Monday to Friday if your dose is less than 2.0 you would take 15 mg plus 1 mg extra= (16 mg total for the day) if you INR is less than 2.0   If your INR is less than 2.0 on Saturday and Sunday you would take (10 mg plus 1 mg) =11 mg total for the day but only if your INR is less than 2.0   Always check INRthe next day (s)if your INR is too low or too high  Change dose 05/09/2019 15 mg daily Monday to Friday  except Saturday to Sunday 10 mg daily   I am ok with INR 2-3.5 for you.   If your INR is above 3.5 -DO NOT take the dose for the dayand check INR the next day in the amALWAYS.  3. Trigeminal neuralgia  4.  Hypothyroidism  5. HM Plan  1. Labs today nonfasting  Cont meds  2. INR check today no longer on tegretol which was likely making INR variable  H/H for RN help with PT/INR machine and also coumadin dosing instructions  Consider Xarelto or eliquis in the future appt upcoming Dr. Tasia Catchings  3.  Can take prn Tylenol # 3 for sever pain  4.  Cont thyroid medication  5.  Flupt utd pna 23utd prevnar had 09/20/16 Tdap 08/04/16 shingrixnot had out of stock called pharmacy,? zostavaxif had MMR immune  saw dermatologyappt 09/02/18  Out of age window pap, colonoscopy, mammo DEXA 01/01/08 osteopeniawill make sure on ca 600 bid and vit D 1000 iu qd -Vitamin d 03/15/18 48.1  Time spent 25 minutes  Provider: Dr. Olivia Mackie McLean-Scocuzza-Internal Medicine

## 2019-05-22 ENCOUNTER — Telehealth: Payer: Self-pay | Admitting: *Deleted

## 2019-05-22 ENCOUNTER — Other Ambulatory Visit: Payer: Medicare Other

## 2019-05-22 DIAGNOSIS — E119 Type 2 diabetes mellitus without complications: Secondary | ICD-10-CM

## 2019-05-22 NOTE — Telephone Encounter (Signed)
MD INR had sent strip s to wrong address not a shortage of strips, MD inr located order and patient should have by tomorrow.

## 2019-05-22 NOTE — Progress Notes (Signed)
See phone note

## 2019-05-22 NOTE — Telephone Encounter (Signed)
-----   Message from Delorise Jackson, MD sent at 05/22/2019  9:16 AM EDT ----- Inform patient   [place] ----- Message ----- From: Nanci Pina, RN Sent: 05/22/2019   9:06 AM EDT To: Nino Glow McLean-Scocuzza, MD  Patient can call [facility] IN[facility] and request. ----- Message ----- From: McLean-Scocuzza, Nino Glow, MD Sent: 05/21/2019   5:52 PM EDT To: Nanci Pina, RN  How can pt get more PT/INR strips than what allotted by the company each month INR variable   [place]

## 2019-05-23 ENCOUNTER — Telehealth: Payer: Self-pay | Admitting: Internal Medicine

## 2019-05-23 ENCOUNTER — Telehealth: Payer: Self-pay

## 2019-05-23 LAB — MICROALBUMIN / CREATININE URINE RATIO
Creatinine, Urine: 149 mg/dL (ref 20–275)
Microalb Creat Ratio: 3 mcg/mg creat (ref <30)
Microalb, Ur: 0.5 mg/dL

## 2019-05-23 LAB — PROTIME-INR
INR: 4.4 — ABNORMAL HIGH (ref 0.8–1.2)
Prothrombin Time: 43.1 s — ABNORMAL HIGH (ref 9.1–12.0)

## 2019-05-23 NOTE — Telephone Encounter (Signed)
INR 6/18 3.5 now today 6/19 2.0   Current Coumadin dose  Ifyour INRis below 2.0take0.5 mg pill (as needed) on the day of your low readingwith your regular dose so Tuesday to Friday if your dose is less than 2.0 you would take 15 mg plus 0.5 mg extra= (15.5 mg total for the day) if you INR is less than 2.0 on Tuesday to Friday    If your INR is less than 2.0 on Saturday and Sunday and Monday you would take (10 mg plus 0.5 mg) =10.5 mg total for the day but only if your INR is less than 2.0    Always check INRthe next day (s)if your INR is too low or too high    Change dose 05/23/2019  15 mg daily Tuesday to Friday  except Saturday to Sunday  To Monday 10 mg daily    I am ok with INR 2-3.5 for you.

## 2019-05-23 NOTE — Telephone Encounter (Signed)
Problems with PT/INR machine can you call pt and company and help?  if machine not working will need to go to [place]

## 2019-05-23 NOTE — Telephone Encounter (Signed)
Purnell Shoemaker, patient's care giver for today, calling and states that the patient was supposed to call with her Blood glucose levels for today. States that she is having issues with the machine, it is not reading a number. States that it is just giving her the numbers from past testings. States that she has tried to test twice and it does the same thing. Please advise.  CB#: [phone]

## 2019-05-23 NOTE — Addendum Note (Signed)
Addended by: Orland Mustard on: 05/23/2019 03:42 PM   Modules accepted: Orders

## 2019-05-23 NOTE — Telephone Encounter (Signed)
Problems with PT/INR machine can you call pt and company and help?   [place]

## 2019-05-23 NOTE — Telephone Encounter (Signed)
Need pt to check INR today   What is it?   Jessica Erickson

## 2019-05-23 NOTE — Telephone Encounter (Signed)
See letter from 05/23/19 sent for Coumadin change in dose and also disc with pt and caretaker Inez Catalina   [place]

## 2019-05-23 NOTE — Telephone Encounter (Signed)
Order Is in for  inr Please make sure goes today   Thanks [place]

## 2019-05-23 NOTE — Addendum Note (Signed)
Addended by: Orland Mustard on: 05/23/2019 02:43 PM   Modules accepted: Orders

## 2019-05-23 NOTE — Telephone Encounter (Signed)
Patient has gone to [facility]

## 2019-05-23 NOTE — Telephone Encounter (Signed)
MD -INR  Cannot check machine until Monday no tech available , patient has transportation to [employer], will need order.

## 2019-05-23 NOTE — Telephone Encounter (Signed)
Kim with [facility] called to report abnormal INR results for patient.  INR was 3.5 on yesterday (05/22/19).

## 2019-05-23 NOTE — Telephone Encounter (Signed)
They were told earlier.

## 2019-05-24 LAB — PROTIME-INR
INR: 2 — ABNORMAL HIGH (ref 0.8–1.2)
Prothrombin Time: 20.1 s — ABNORMAL HIGH (ref 9.1–12.0)

## 2019-05-28 ENCOUNTER — Telehealth: Payer: Self-pay | Admitting: *Deleted

## 2019-05-28 NOTE — Telephone Encounter (Signed)
I called & spoke with a rep at [facility] lab after noticing that her CBC never resulted. Santiago Glad apologized & said it was missed. Pt would need to be redrawn to run the CBC.

## 2019-05-28 NOTE — Telephone Encounter (Signed)
Labs 05/21/19  Kidney #s slightly increased rec increase water intake but less than 1.5 Liters of fluid daily  Thyroid lab normal  A1C 6.4 but sugar in moderation   Thanks [place]

## 2019-05-28 NOTE — Telephone Encounter (Signed)
Called patient.  Patient was asleep.  Left message to call back.

## 2019-05-29 ENCOUNTER — Encounter: Payer: Self-pay | Admitting: Internal Medicine

## 2019-05-29 ENCOUNTER — Telehealth: Payer: Self-pay | Admitting: Internal Medicine

## 2019-05-29 NOTE — Telephone Encounter (Signed)
Patient caregiver notified and voiced understanding.

## 2019-05-29 NOTE — Telephone Encounter (Signed)
'  Jessica Erickson' with [facility] calling to report pt's INR today 6.6.  TN called practice and spoke with Butch Penny, made aware of resulted INR.

## 2019-05-29 NOTE — Telephone Encounter (Signed)
INR 6.6  Hold the dose for 05/29/19  Check INR Friday 05/30/19 AROUND LUNCH  IF INR ELEVATED HOLD THE DOSE AND CHECK Saturday AND DAILY UNTIL IN RANGE 2-3   Change in Coumadin dose  Ifyour INRis below 2.0take0.5 mg pill (as needed) on the day of your low readingwith your regular dose so Tuesday to Friday if your dose is less than 2.0 you would take 15 mg plus 0.5 mg extra= (15.5 mg total for the day) if you INR is less than 2.0 on Tuesday to Friday    If your INR is less than 2.0 on Saturday and Sunday and Monday you would take (10 mg plus 0.5 mg) =10.5 mg total for the day but only if your INR is less than 2.0    ALWAYS check INRthe next day (s)if your INR is too low or too high    Change dose  15 mg daily WEDNESDAY to Friday  except  Saturday, Sunday, Monday, Tuesday 10 mg daily (SHE WAS DOING 15 MG ON Tuesday CHANGE TO 10)   I am ok with INR 2-3.5 for you.

## 2019-05-29 NOTE — Telephone Encounter (Signed)
INR 6.6 on 05/29/19 Hold the dose for 05/29/19  Check INR Friday 05/30/19 AROUND LUNCH  IF INR ELEVATED HOLD THE DOSE AND CHECK DAILY UNTIL IN RANGE 2-3.5  Change in Coumadin dose  Ifyour INRis below 2.0take0.5 mg pill (as needed) on the day of your low readingwith your regular dose so Wednesday to Friday if your dose is less than 2.0 you would take 15 mg plus 0.5 mg extra= (15.5 mg total for the day)   If your INR is less than 2.0 on Saturday and Sunday and Monday or Tuesday you would take (10 mg plus 0.5 mg) =10.5 mg total for the day   ALWAYS check INRthe next day (s)if your INR is too low or too high    Change dose  15 mg daily WEDNESDAY to Friday  except  Saturday, Sunday, Monday, Tuesday 10 mg daily (SHE WAS DOING 15 MG ON Tuesday CHANGE TO 10)   I am ok with INR 2-3.5 for you.  [place]

## 2019-05-30 ENCOUNTER — Telehealth: Payer: Self-pay | Admitting: Internal Medicine

## 2019-05-30 NOTE — Telephone Encounter (Signed)
INR 2.3 05/30/19 fRIDAY You can resume your Coumadin at the dose   CHECK INR TUES. 06/03/19 AND THURS 06/05/19 AS WE ARE CLOSED ON FRIDAY  IF INR ELEVATED HOLD THE DOSE AND CHECK DAILY UNTIL IN RANGE 2-3.5  Change in Coumadin dose  Ifyour INRis below 2.0take0.5 mg pill (as needed) on the day of your low readingwith your regular dose so Wednesday to Friday if your dose is less than 2.0 you would take 15 mg plus 0.5 mg extra= (15.5 mg total for the day)   If your INR is less than 2.0 on Saturday and Sunday and Monday or Tuesday you would take (10 mg plus 0.5 mg) =10.5 mg total for the day   ALWAYS check INRthe next day (s)if your INR is too low or too high    Change dose  15 mg daily [facility] Friday  except  Saturday, Sunday, Monday, Tuesday10 mg daily (SHE WAS DOING 15 MG ON Tuesday CHANGE TO 10)   I am ok with INR 2-3.5 for you.

## 2019-06-03 NOTE — Telephone Encounter (Signed)
Results given and letter mailed top inform them of directions.

## 2019-06-03 NOTE — Addendum Note (Signed)
Addended by: Leeanne Rio on: 06/03/2019 09:29 AM   Modules accepted: Orders

## 2019-06-05 ENCOUNTER — Telehealth: Payer: Self-pay | Admitting: Internal Medicine

## 2019-06-05 NOTE — Telephone Encounter (Signed)
INR 06/05/2019 2.6 normal range continue the same dose   Ifyour INRis below 2.0take0.5 mg pill (as needed) on the day of your low readingwith your regular dose so Wednesday to Friday if your dose is less than 2.0 you would take 15 mg plus 0.5 mg extra= (15.5 mg total for the day)   If your INR is less than 2.0 on Saturday and Sunday and Monday or Tuesday you would take (10 mg plus 0.5 mg) =10.5 mg total for the day   ALWAYS check INRthe next day (s)if your INR is too low or too high    dose  15 mg daily [facility] Friday  except  Saturday, Sunday, Monday, Tuesday10 mg daily (SHE WAS DOING 15 MG ON Tuesday CHANGE TO 10)   I am ok with INR 2-3.5 for you.

## 2019-06-09 NOTE — Telephone Encounter (Addendum)
Called and spoke to daughter Wells Guiles. (ok per DPR listed in patient's chart) Gave lab results from 05/21/19 and recommendation per PCP's note.

## 2019-06-09 NOTE — Telephone Encounter (Signed)
Spoke with Vaughan Basta caregiver for patient she stated she understood coumadin regimen given.

## 2019-06-13 ENCOUNTER — Telehealth: Payer: Self-pay

## 2019-06-13 NOTE — Telephone Encounter (Signed)
[name] has been informed, she can only check INR once. She will be out of strips.

## 2019-06-13 NOTE — Telephone Encounter (Signed)
INR 4.2 too high hold dose for today  Check INR on tomorrow and Monday  [place]

## 2019-06-13 NOTE — Telephone Encounter (Signed)
Kim with [facility] called to report patient's PT INR.  INR - 4.2  PT - 46.8

## 2019-06-17 ENCOUNTER — Telehealth: Payer: Self-pay | Admitting: Internal Medicine

## 2019-06-17 NOTE — Telephone Encounter (Signed)
INR 06/14/19 3.5 then 06/16/19 1.6  She is ok to resume coumadin  Watch her diet    Coumadin dose  Ifyour INRis below 2.0take0.5 mg pill (as needed) on the day of your low readingwith your regular dose so Wednesday to Friday if your dose is less than 2.0 you would take 15 mg plus 0.5 mg extra= (15.5 mg total for the day)   If your INR is less than 2.0 on Saturday and Sunday and Monday or Tuesday you would take (10 mg plus 0.5 mg) =10.5 mg total for the day   ALWAYS check INRthe next day (s)if your INR is too low or too high    Change dose  15 mg daily [facility] Friday  except  Saturday, Sunday, Monday, Tuesday10 mg daily (SHE WAS DOING 15 MG ON Tuesday CHANGE TO 10)   I am ok with INR 2-3.5 for you.

## 2019-06-18 ENCOUNTER — Telehealth: Payer: Self-pay

## 2019-06-18 NOTE — Telephone Encounter (Signed)
Copied from [place] [id number]. Topic: General - Other >> Jun 18, 2019  4:55 PM Parke Poisson wrote: Reason for CRM: Pt's daughter [name] states that pt is not using her ipratropium-albuterol (DUONEB) 0.5-2.5 (3) MG/3ML SOLN because it causes her trigeminal neuralgia to flare up.She wants to know if something different can be sent to [place], [place] [phone] (Phone) [phone] (Fax) or some guidance on what should be done.Pt is wheezing but states this is not a new problem.Her contact number is [phone] and she would like a call back to discuss

## 2019-06-18 NOTE — Telephone Encounter (Signed)
Caregiver given instructions.

## 2019-06-19 NOTE — Telephone Encounter (Signed)
Nancy has been informed

## 2019-06-19 NOTE — Telephone Encounter (Signed)
Call daughte rNancy  I would like for her to use the Duoneb if she can for wheezing as needed and use her flutter valve  I dont know of anything else but if continues the lung doctor may have better ideas on how to control   [facility]   [place]

## 2019-06-20 ENCOUNTER — Other Ambulatory Visit: Payer: Self-pay | Admitting: Internal Medicine

## 2019-06-20 ENCOUNTER — Telehealth: Payer: Self-pay | Admitting: Internal Medicine

## 2019-06-20 DIAGNOSIS — G4700 Insomnia, unspecified: Secondary | ICD-10-CM

## 2019-06-20 DIAGNOSIS — F329 Major depressive disorder, single episode, unspecified: Secondary | ICD-10-CM

## 2019-06-20 DIAGNOSIS — F32A Depression, unspecified: Secondary | ICD-10-CM

## 2019-06-20 MED ORDER — MIRTAZAPINE 15 MG PO TABS: 15.0000 mg | ORAL_TABLET | Freq: Every day | ORAL | 3 refills | Status: DC

## 2019-06-20 NOTE — Telephone Encounter (Signed)
INR 06/20/19 2.5   She is ok to resume coumadin  Watch her diet    Coumadin dose  Ifyour INRis below 2.0take0.5 mg pill (as needed) on the day of your low readingwith your regular dose so Wednesday to Friday if your dose is less than 2.0 you would take 15 mg plus 0.5 mg extra= (15.5 mg total for the day)   If your INR is less than 2.0 on Saturday and Sunday and Monday or Tuesday you would take (10 mg plus 0.5 mg) =10.5 mg total for the day   ALWAYS check INRthe next day (s)if your INR is too low or too high    Change dose  15 mg daily [facility] Friday  except  Saturday, Sunday, Monday, Tuesday10 mg daily (SHE WAS DOING 15 MG ON Tuesday CHANGE TO 10)   I am ok with INR 2-3.5 for you.

## 2019-06-23 NOTE — Telephone Encounter (Signed)
Patient was informed of results.  Patient understood and no questions, comments, or concerns at this time.  

## 2019-07-02 ENCOUNTER — Telehealth: Payer: Self-pay | Admitting: Internal Medicine

## 2019-07-02 NOTE — Telephone Encounter (Signed)
INR 06/27/19 2.4  Watch her diet   Coumadin dose  Ifyour INRis below 2.0take0.5 mg pill (as needed) on the day of your low readingwith your regular dose so Wednesday to Friday if your dose is less than 2.0 you would take 15 mg plus 0.5 mg extra= (15.5 mg total for the day)   If your INR is less than 2.0 on Saturday and Sunday and Monday or Tuesday you would take (10 mg plus 0.5 mg) =10.5 mg total for the day   ALWAYS check INRthe next day (s)if your INR is too low or too high    Change dose  15 mg daily [facility] Friday  except  Saturday, Sunday, Monday, Tuesday10 mg daily (SHE WAS DOING 15 MG ON Tuesday CHANGE TO 10)   I am ok with INR 2-3.5 for you.

## 2019-07-03 ENCOUNTER — Telehealth: Payer: Self-pay | Admitting: Internal Medicine

## 2019-07-03 NOTE — Telephone Encounter (Signed)
Copied from [place] [phone]. Topic: General - Other >> Jul 03, 2019  3:54 PM Keene Breath wrote: Reason for CRM: Patient's daughter called to inform the doctor that the care giver reported the patient's INR to be 5.9, which she thinks is very high.  Please have the nurse or doctor call to discuss.  CB# [phone]

## 2019-07-03 NOTE — Telephone Encounter (Signed)
Copied from [place] [phone]. Topic: Quick Communication - Home Health Verbal Orders >> Jul 03, 2019  3:46 PM Erick Blinks wrote: Caller/Agency: Erlene Senters  Callback Number: [phone] Requesting OT/PT/Skilled Nursing/Social Work/Speech Therapy: INR 5.9 it has been faxed over

## 2019-07-03 NOTE — Telephone Encounter (Signed)
See prior note  Call daughter and the patient   [place]

## 2019-07-03 NOTE — Telephone Encounter (Signed)
Patients daughter was informed of results.  Patient understood and no questions, comments, or concerns at this time.

## 2019-07-03 NOTE — Telephone Encounter (Signed)
Inform pt and daughter [name] [phone] INR 06/27/19 5.9  -She is to not take Coumadin today 07/03/19  -she is to check INR tomorrow and if >3.5 not take coumadin 07/04/19    Watch her dietINR 06/27/19 was just 2.4 normal range I think its her diet.   Also inform patient that I would consider changing to another form of anticoagulation given than her INR is always variable   This can be discussed with hematology and appt upcoming I believe     Coumadin dose  Ifyour INRis below 2.0take0.5 mg pill (as needed) on the day of your low readingwith your regular dose so Wednesday to Friday if your dose is less than 2.0 you would take 15 mg plus 0.5 mg extra= (15.5 mg total for the day)   If your INR is less than 2.0 on Saturday and Sunday and Monday or Tuesday you would take (10 mg plus 0.5 mg) =10.5 mg total for the day   ALWAYS check INRthe next day (s)if your INR is too low or too high    Change dose  15 mg daily [facility] Friday  except  Saturday, Sunday, Monday, Tuesday10 mg daily (SHE WAS DOING 15 MG ON Tuesday CHANGE TO 10)   I am ok with INR 2-3.5 for you.

## 2019-07-04 ENCOUNTER — Telehealth: Payer: Self-pay | Admitting: Internal Medicine

## 2019-07-04 NOTE — Telephone Encounter (Signed)
Daughter has been informed.

## 2019-07-04 NOTE — Telephone Encounter (Signed)
Inform pt and daughter [name] [phone] INR 07/04/19 4.6 -She is to not take Coumadin today 07/04/19  -she is to check INR tomorrow and if >3.5 not take coumadin 07/05/19    Watch her dietINR 06/27/19 was just 2.4 normal range I think its her diet.   Also inform patient that I would consider changing to another form of anticoagulation given than her INR is always variable   This can be discussed with hematology and appt upcoming I believe     Coumadin dose  Ifyour INRis below 2.0take0.5 mg pill (as needed) on the day of your low readingwith your regular    ALWAYS check INRthe next day (s)if your INR is too low or too high    Change dose  15 mg daily [facility] Friday  except  Saturday, Sunday, Monday, Tuesday10 mg daily (SHE WAS DOING 15 MG ON Tuesday CHANGE TO 10)   I am ok with INR 2-3.5 for you.

## 2019-07-04 NOTE — Telephone Encounter (Signed)
Pt's daughter [name]) is requesting a call back from Dr Claris Gladden nurse re: pt's PTINR (4.6) Please call [name]: [phone]

## 2019-07-04 NOTE — Telephone Encounter (Signed)
Called to report INR results - 4.6 today.  CB# [phone]

## 2019-07-04 NOTE — Telephone Encounter (Signed)
Spoken to daughter. Informed her of what Dr Olivia Mackie stated

## 2019-07-07 ENCOUNTER — Other Ambulatory Visit: Payer: Self-pay

## 2019-07-07 ENCOUNTER — Telehealth: Payer: Self-pay | Admitting: Internal Medicine

## 2019-07-07 ENCOUNTER — Other Ambulatory Visit (INDEPENDENT_AMBULATORY_CARE_PROVIDER_SITE_OTHER): Payer: Medicare Other

## 2019-07-07 ENCOUNTER — Telehealth: Payer: Self-pay | Admitting: *Deleted

## 2019-07-07 ENCOUNTER — Other Ambulatory Visit: Payer: Self-pay | Admitting: Internal Medicine

## 2019-07-07 DIAGNOSIS — Z7901 Long term (current) use of anticoagulants: Secondary | ICD-10-CM

## 2019-07-07 NOTE — Telephone Encounter (Signed)
Pt's daughter [name] calling, on [place], unsure what Coumadin dose to give pt.   States lab drawn this afternoon; was told she would hear back for dosing.  During call, [place] called through with results, PT 13.0  INR 1.2.    Read back to [name]' at [place].  [place] called back daughter, [name], states she has dosing scale and will dose accordingly. Will give pt 10.5 mg tonight according to scale. Assured TN would alert Dr. Terese Door.

## 2019-07-07 NOTE — Telephone Encounter (Signed)
Please place future orders for lab appt.  

## 2019-07-07 NOTE — Telephone Encounter (Signed)
Daughter called.  She had not heard about her mothers INR. She states that her mother had high INR Thursday 5.9 and Friday 4.6.  She had her mother to the office today for a lab draw and now need the result. Per the chart INR has not resulted.  Daughter was advised to call back before 7pm to inquire again.  She verbalized understanding

## 2019-07-08 LAB — PROTIME-INR
INR: 1.2 (ref 0.8–1.2)
Prothrombin Time: 13 s — ABNORMAL HIGH (ref 9.1–12.0)

## 2019-07-08 NOTE — Telephone Encounter (Signed)
See result note.  

## 2019-07-08 NOTE — Telephone Encounter (Signed)
Pt's daughter called requesting correspondence about coumadin dosage.  Best contact: [phone]

## 2019-07-08 NOTE — Telephone Encounter (Signed)
SEE result note daughter aware.

## 2019-07-08 NOTE — Telephone Encounter (Signed)
SEE result note tried to call daughter no answer left voicemail.

## 2019-07-11 ENCOUNTER — Encounter: Payer: Self-pay | Admitting: Internal Medicine

## 2019-07-11 ENCOUNTER — Telehealth: Payer: Self-pay

## 2019-07-11 LAB — POCT INR: INR: 2.6 (ref 2.0–3.0)

## 2019-07-11 NOTE — Telephone Encounter (Signed)
INR 2.6 07/11/2019  Inform pt and daughter Nancy [phone]  Watch her dietINR  Is related to diet  Also inform patient that I would consider changing to another form of anticoagulation given than her INR is always variable   This can be discussed with hematology and appt upcoming I believe   Coumadin dose  Ifyour INRis below 2.0take0.5 mg pill (as needed) on the day of your low readingwith your regular    ALWAYS check INRthe next day (s)if your INR is too low or too high    Change dose  15 mg daily [facility] Friday  except  Saturday, Sunday, Monday, Tuesday10 mg daily (SHE WAS DOING 15 MG ON Tuesday CHANGE TO 10)   I am ok with INR 2-3.5 for you.

## 2019-07-11 NOTE — Telephone Encounter (Signed)
Results and instructions given to pt's daughter Jessica Erickson.  Pt's daughter wants to make sure that if reading is greater than 3.5 should she hold the dosage.   Called and spoke w/ pt while w/ caregiver.  Results and nstructions given.

## 2019-07-11 NOTE — Telephone Encounter (Signed)
Correct if ever > 3.5 INR hold the dose and check INR the next day   [place]

## 2019-07-11 NOTE — Telephone Encounter (Signed)
mdINR fax came in with INR results: 2.6 for patient on 07/11/19.

## 2019-07-14 ENCOUNTER — Other Ambulatory Visit: Payer: Self-pay

## 2019-07-14 ENCOUNTER — Ambulatory Visit: Payer: Medicare Other | Admitting: Podiatry

## 2019-07-14 ENCOUNTER — Encounter: Payer: Self-pay | Admitting: Podiatry

## 2019-07-14 VITALS — Temp 98.4°F

## 2019-07-14 DIAGNOSIS — E1140 Type 2 diabetes mellitus with diabetic neuropathy, unspecified: Secondary | ICD-10-CM

## 2019-07-14 DIAGNOSIS — D689 Coagulation defect, unspecified: Secondary | ICD-10-CM | POA: Diagnosis not present

## 2019-07-14 DIAGNOSIS — M79676 Pain in unspecified toe(s): Secondary | ICD-10-CM

## 2019-07-14 DIAGNOSIS — B351 Tinea unguium: Secondary | ICD-10-CM | POA: Diagnosis not present

## 2019-07-14 NOTE — Progress Notes (Signed)
Complaint:  Visit Type: Patient returns to my office for continued preventative foot care services. Complaint: Patient states" my nails have grown long and thick and become painful to walk and wear shoes" Patient has been diagnosed with DM with neuropathy. The patient presents for preventative foot care services. No changes to ROS.  Patient is taking coumadin.  Podiatric Exam: Vascular: dorsalis pedis and posterior tibial pulses are palpable bilateral. Capillary return is immediate. Temperature gradient is WNL. Skin turgor WNL  Sensorium: Diminished Semmes Weinstein monofilament test. Normal tactile sensation bilaterally. Nail Exam: Pt has thick disfigured discolored nails with subungual debris noted bilateral entire nail hallux through fifth toenails Ulcer Exam: There is no evidence of ulcer or pre-ulcerative changes or infection. Orthopedic Exam: Muscle tone and strength are WNL. No limitations in general ROM. No crepitus or effusions noted. Foot type and digits show no abnormalities. HAV  B/L. Skin: No Porokeratosis. No infection or ulcers.  Callus sub 1st MPJ right foot.  Diagnosis:  Onychomycosis, , Pain in right toe, pain in left toes Debride callus.  Treatment & Plan Procedures and Treatment: Consent by patient was obtained for treatment procedures.   Debridement of mycotic and hypertrophic toenails, 1 through 5 bilateral and clearing of subungual debris. No ulceration, no infection noted.  Patient has inflamed big toe joint clinically but no pain in the joint.  Pain  Is present  under big toe joint at site of callus. Return Visit-Office Procedure: Patient instructed to return to the office for a follow up visit 3 months for continued evaluation and treatment.    Gardiner Barefoot DPM

## 2019-07-15 ENCOUNTER — Telehealth: Payer: Self-pay | Admitting: Internal Medicine

## 2019-07-15 ENCOUNTER — Other Ambulatory Visit: Payer: Self-pay | Admitting: Family Medicine

## 2019-07-15 DIAGNOSIS — K5900 Constipation, unspecified: Secondary | ICD-10-CM

## 2019-07-15 NOTE — Telephone Encounter (Signed)
inr 07/07/2019 1.2 see latest inr in range 07/11/2019 2.6   TMS

## 2019-07-17 ENCOUNTER — Other Ambulatory Visit: Payer: Self-pay | Admitting: Internal Medicine

## 2019-07-17 ENCOUNTER — Telehealth: Payer: Self-pay | Admitting: Internal Medicine

## 2019-07-17 DIAGNOSIS — Z20822 Contact with and (suspected) exposure to covid-19: Secondary | ICD-10-CM

## 2019-07-17 DIAGNOSIS — Z20828 Contact with and (suspected) exposure to other viral communicable diseases: Secondary | ICD-10-CM

## 2019-07-17 NOTE — Telephone Encounter (Signed)
I called and spoke with the daughter [name] and informed her that the caregiver should not be there for 14 days, she should be quarantined, I also informed her that her mother should be tested in 2-5 days at [facility], can you please put in the order and she will have the patient. Tested.  Leviathan Macera,cma

## 2019-07-17 NOTE — Telephone Encounter (Signed)
Pt's daughter, [name], calling.  States that pt has a caregiver that works one week on and one week off.  States that the new caregiver came today to meet pt and mentioned that she has 4 family member who she has seen that are positive for COVID.   Pt's daughter is now concerned pt may have been exposed and would like to know what to do and would also like advise on should this person be a caregiver at this time.

## 2019-07-17 NOTE — Telephone Encounter (Signed)
The caregiver should not be coming if exposed and needs to stay at home x 14 days   Mrs. Jessica Erickson can go for testing for covid in 2-5 days at [facility]  Does she want me to order testing  Call Jessica Erickson pts daughter   [place]

## 2019-07-18 ENCOUNTER — Encounter: Payer: Self-pay | Admitting: Internal Medicine

## 2019-07-18 LAB — PROTIME-INR: INR: 3.9 — AB (ref 0.9–1.1)

## 2019-07-21 ENCOUNTER — Other Ambulatory Visit: Payer: Self-pay

## 2019-07-21 DIAGNOSIS — Z20822 Contact with and (suspected) exposure to covid-19: Secondary | ICD-10-CM

## 2019-07-22 ENCOUNTER — Telehealth: Payer: Self-pay | Admitting: Internal Medicine

## 2019-07-22 LAB — NOVEL CORONAVIRUS, NAA: SARS-CoV-2, NAA: NOT DETECTED

## 2019-07-22 NOTE — Telephone Encounter (Signed)
INR 3.9 07/18/2019  Hold dose when >3.5 INR   Inform pt and daughter Nancy [phone]  Watch her dietINR  Is related to diet  Also inform patient that I would consider changing to another form of anticoagulation given than her INR is always variable   This can be discussed with hematology and appt upcoming I believe   Coumadin dose  Ifyour INRis below 2.0take0.5 mg pill (as needed) on the day of your low readingwith your regular    ALWAYS check INRthe next day (s)if your INR is too low or too high    Change dose  15 mg daily [facility] Friday  except  Saturday, Sunday, Monday, Tuesday10 mg daily (SHE WAS DOING 15 MG ON Tuesday CHANGE TO 10)   I am ok with INR 2-3.5 for you.

## 2019-07-23 ENCOUNTER — Other Ambulatory Visit: Payer: Self-pay | Admitting: Internal Medicine

## 2019-07-23 ENCOUNTER — Telehealth: Payer: Self-pay

## 2019-07-23 DIAGNOSIS — Z5181 Encounter for therapeutic drug level monitoring: Secondary | ICD-10-CM

## 2019-07-23 DIAGNOSIS — I824Y3 Acute embolism and thrombosis of unspecified deep veins of proximal lower extremity, bilateral: Secondary | ICD-10-CM

## 2019-07-23 DIAGNOSIS — E2839 Other primary ovarian failure: Secondary | ICD-10-CM

## 2019-07-23 MED ORDER — WARFARIN SODIUM 10 MG PO TABS: ORAL_TABLET | ORAL | 3 refills | Status: DC

## 2019-07-23 MED ORDER — WARFARIN SODIUM 1 MG PO TABS: ORAL_TABLET | ORAL | 3 refills | Status: DC

## 2019-07-23 MED ORDER — CALCIUM CARBONATE-VITAMIN D 600-400 MG-UNIT PO TABS: 1.0000 | ORAL_TABLET | Freq: Two times a day (BID) | ORAL | 3 refills | Status: DC

## 2019-07-23 MED ORDER — WARFARIN SODIUM 5 MG PO TABS: ORAL_TABLET | ORAL | 3 refills | Status: DC

## 2019-07-23 NOTE — Telephone Encounter (Signed)
Forms faxed to [facility].

## 2019-07-24 ENCOUNTER — Other Ambulatory Visit: Payer: Self-pay | Admitting: Internal Medicine

## 2019-07-24 DIAGNOSIS — J309 Allergic rhinitis, unspecified: Secondary | ICD-10-CM

## 2019-07-24 MED ORDER — FLUTICASONE PROPIONATE 50 MCG/ACT NA SUSP: 1.0000 | Freq: Every day | NASAL | 11 refills | Status: DC

## 2019-07-24 NOTE — Telephone Encounter (Signed)
Patients daughter was informed of results.

## 2019-07-25 ENCOUNTER — Telehealth: Payer: Self-pay | Admitting: Internal Medicine

## 2019-07-25 NOTE — Telephone Encounter (Signed)
INR 07/25/19 2.0   Continue same dose coumadin  Inform pt and daughter Nancy [phone]  Watch her dietINRIs related to diet  Also inform patient that I would consider changing to another form of anticoagulation given than her INR is always variable   This can be discussed with hematology and appt upcoming I believe   Coumadin dose  Ifyour INRis below 2.0take0.5 mg pill (as needed) on the day of your low readingwith your regular    ALWAYS check INRthe next day (s)if your INR is too low or too high    Change dose  15 mg daily [facility] Friday  except  Saturday, Sunday, Monday, Tuesday10 mg daily (SHE WAS DOING 15 MG ON Tuesday CHANGE TO 10)   I am ok with INR 2-3.5 for you.

## 2019-07-28 ENCOUNTER — Inpatient Hospital Stay (HOSPITAL_BASED_OUTPATIENT_CLINIC_OR_DEPARTMENT_OTHER): Payer: Medicare Other | Admitting: Oncology

## 2019-07-28 ENCOUNTER — Inpatient Hospital Stay: Payer: Medicare Other | Attending: Oncology

## 2019-07-28 ENCOUNTER — Other Ambulatory Visit: Payer: Self-pay

## 2019-07-28 ENCOUNTER — Encounter: Payer: Self-pay | Admitting: Oncology

## 2019-07-28 VITALS — BP 126/75 | HR 76 | Temp 98.7°F | Resp 20 | Wt 190.2 lb

## 2019-07-28 DIAGNOSIS — Z86711 Personal history of pulmonary embolism: Secondary | ICD-10-CM | POA: Diagnosis not present

## 2019-07-28 DIAGNOSIS — Z86718 Personal history of other venous thrombosis and embolism: Secondary | ICD-10-CM

## 2019-07-28 DIAGNOSIS — Z8673 Personal history of transient ischemic attack (TIA), and cerebral infarction without residual deficits: Secondary | ICD-10-CM | POA: Insufficient documentation

## 2019-07-28 DIAGNOSIS — I82513 Chronic embolism and thrombosis of femoral vein, bilateral: Secondary | ICD-10-CM

## 2019-07-28 DIAGNOSIS — Z85828 Personal history of other malignant neoplasm of skin: Secondary | ICD-10-CM | POA: Diagnosis not present

## 2019-07-28 DIAGNOSIS — Z803 Family history of malignant neoplasm of breast: Secondary | ICD-10-CM | POA: Insufficient documentation

## 2019-07-28 DIAGNOSIS — Z7901 Long term (current) use of anticoagulants: Secondary | ICD-10-CM | POA: Insufficient documentation

## 2019-07-28 DIAGNOSIS — Z87891 Personal history of nicotine dependence: Secondary | ICD-10-CM | POA: Diagnosis not present

## 2019-07-28 DIAGNOSIS — Z79899 Other long term (current) drug therapy: Secondary | ICD-10-CM | POA: Diagnosis not present

## 2019-07-28 DIAGNOSIS — Z95828 Presence of other vascular implants and grafts: Secondary | ICD-10-CM

## 2019-07-28 DIAGNOSIS — J449 Chronic obstructive pulmonary disease, unspecified: Secondary | ICD-10-CM | POA: Diagnosis not present

## 2019-07-28 DIAGNOSIS — Z7984 Long term (current) use of oral hypoglycemic drugs: Secondary | ICD-10-CM | POA: Insufficient documentation

## 2019-07-28 DIAGNOSIS — I825Y3 Chronic embolism and thrombosis of unspecified deep veins of proximal lower extremity, bilateral: Secondary | ICD-10-CM

## 2019-07-28 DIAGNOSIS — Z7951 Long term (current) use of inhaled steroids: Secondary | ICD-10-CM | POA: Insufficient documentation

## 2019-07-28 LAB — PROTIME-INR
INR: 3.7 — ABNORMAL HIGH (ref 0.8–1.2)
Prothrombin Time: 35.9 seconds — ABNORMAL HIGH (ref 11.4–15.2)

## 2019-07-28 LAB — COMPREHENSIVE METABOLIC PANEL
ALT: 14 U/L (ref 0–44)
AST: 21 U/L (ref 15–41)
Albumin: 3.3 g/dL — ABNORMAL LOW (ref 3.5–5.0)
Alkaline Phosphatase: 63 U/L (ref 38–126)
Anion gap: 9 (ref 5–15)
BUN: 18 mg/dL (ref 8–23)
CO2: 30 mmol/L (ref 22–32)
Calcium: 8.5 mg/dL — ABNORMAL LOW (ref 8.9–10.3)
Chloride: 102 mmol/L (ref 98–111)
Creatinine, Ser: 1.23 mg/dL — ABNORMAL HIGH (ref 0.44–1.00)
GFR calc Af Amer: 44 mL/min — ABNORMAL LOW (ref >60)
GFR calc non Af Amer: 38 mL/min — ABNORMAL LOW (ref >60)
Glucose, Bld: 105 mg/dL — ABNORMAL HIGH (ref 70–99)
Potassium: 4.1 mmol/L (ref 3.5–5.1)
Sodium: 141 mmol/L (ref 135–145)
Total Bilirubin: 0.5 mg/dL (ref 0.3–1.2)
Total Protein: 7 g/dL (ref 6.5–8.1)

## 2019-07-28 LAB — CBC WITH DIFFERENTIAL/PLATELET
Abs Immature Granulocytes: 0.03 10*3/uL (ref 0.00–0.07)
Basophils Absolute: 0 10*3/uL (ref 0.0–0.1)
Basophils Relative: 1 %
Eosinophils Absolute: 0.6 10*3/uL — ABNORMAL HIGH (ref 0.0–0.5)
Eosinophils Relative: 9 %
HCT: 41 % (ref 36.0–46.0)
Hemoglobin: 13.2 g/dL (ref 12.0–15.0)
Immature Granulocytes: 1 %
Lymphocytes Relative: 22 %
Lymphs Abs: 1.4 10*3/uL (ref 0.7–4.0)
MCH: 29.3 pg (ref 26.0–34.0)
MCHC: 32.2 g/dL (ref 30.0–36.0)
MCV: 91.1 fL (ref 80.0–100.0)
Monocytes Absolute: 0.9 10*3/uL (ref 0.1–1.0)
Monocytes Relative: 14 %
Neutro Abs: 3.4 10*3/uL (ref 1.7–7.7)
Neutrophils Relative %: 53 %
Platelets: 219 10*3/uL (ref 150–400)
RBC: 4.5 MIL/uL (ref 3.87–5.11)
RDW: 14.6 % (ref 11.5–15.5)
WBC: 6.3 10*3/uL (ref 4.0–10.5)
nRBC: 0 % (ref 0.0–0.2)

## 2019-07-28 MED ORDER — APIXABAN 2.5 MG PO TABS: 2.5000 mg | ORAL_TABLET | Freq: Two times a day (BID) | ORAL | 3 refills | Status: DC

## 2019-07-28 NOTE — Progress Notes (Signed)
Patient has a home INR machine and has been checking levels at home.  She checks INR on Fridays and the PCP will give them a call to adjust dose.  Current Coumdadin dose is 10mg  Sat, Sun, Mon, Tu with 15 mg on We, Th, Fri.  INR goa is 2.0-3.5.  Jessica Erickson is asking if Dr. Tasia Catchings would be wiling to change to another anticoagulation medication due to trouble achieving therapeutic goal.

## 2019-07-28 NOTE — Progress Notes (Signed)
Hematology/Oncology follow up note [facility] Telephone:([phone]) [phone] [phone]   Patient Care Team: McLean-Scocuzza, Nino Glow, MD as PCP - General (Internal Medicine)  REFERRING PROVIDER: McLean-Scocuzza, Nino Glow, MD REASON FOR VISIT Follow up for treatment of DVT  HISTORY OF PRESENTING ILLNESS:  Jessica Erickson is a  85 y.o.  female with PMH listed below who was referred to me for evaluation of DVT and chronic anticoagulation.  She has hard hearing and most history was obtained from her son and caregiver who accompanied her to clinic today.   Patient had a history of provoked right femoral and popliteal DVT and PE in 2016 after an episode of fall and back procedure. S/p IVC filter placed. She was on coumadin (19mg  daily per pcp) for anticoagulation for a period of time and eventually came off.  IVC filter was left in after a discussion between Dr.Dew and family, given her advanced age and multiple comorbidities.   Patient presented to ER for evaluation of a week of worsening LE swelling and pain.  03/29/2018 US showed extensive bilateral lower extremity DVT.  She was restarted on anticoagulation. Currently on Lovenox bridge to coumadin, per son with INR goal between 2.5-3.   04/04/2018 she had right upper extremity pain and swelling. US showed a subcutaneous soft tissue hematoma   INTERVAL HISTORY Jessica Erickson is a 85 y.o. female who has above history reviewed by me today presents for follow-up of DVT, on chronic anticoagulation.  Patient is hard hearing, not able to communicate well.  I have to write down instructions and advice for her. No new complaints today.  I called patient's daughter [name] who provided history and participate in discussion. Patient has been on Coumadin for chronic anticoagulation.  No bleeding events. Daughter informs me that patient/family are now interested in switching to DOACs.  She currently have caregiver check INR at home  and communicated with PCP for Coumadin dosing.  Her INR has been fluctuating lately. Currently Coumadin dose is 10 mg Saturday Sunday and Monday, Tuesday, and 50 mg on Wednesday Thursday Friday. Patient has history of multiple falls.   Review of Systems  Constitutional: Negative for chills, fever, malaise/fatigue and weight loss.  HENT: Positive for hearing loss. Negative for sore throat.   Eyes: Negative for redness.  Respiratory: Negative for cough, shortness of breath and wheezing.   Cardiovascular: Negative for chest pain, palpitations and leg swelling.       Trace leg swelling  Gastrointestinal: Negative for abdominal pain, blood in stool, nausea and vomiting.  Genitourinary: Negative for dysuria.  Musculoskeletal: Negative for myalgias.  Skin: Negative for rash.  Neurological: Negative for dizziness, tingling and tremors.  Endo/Heme/Allergies: Bruises/bleeds easily.  Psychiatric/Behavioral: Negative for hallucinations.    MEDICAL HISTORY:  Past Medical History:  Diagnosis Date  . Acoustic neuroma ([place])   . Allergy   . Asthma   . Bilateral swelling of feet    and legs  . Bladder infection   . CAD (coronary artery disease)   . Cataract   . Change in voice   . Compression fracture of body of thoracic vertebra (HCC)    T12 09/18/15 MRI s/p fall   . Constipation   . COPD (chronic obstructive pulmonary disease) ([place])    previous CXR with chronic interstitial lung dz   . CVA (cerebral vascular accident) ([place])   . Depression   . Diabetes ([place])    with neuropathy  . Diabetes mellitus, type 2 ([place])   .  Diarrhea   . Double vision   . DVT (deep venous thrombosis) ([place])    right leg 10/2015 was on coumadin off as of 2017/2018 ; s/p IVC filter  . Enuresis   . Eye pain, right   . Fall   . Fatty liver    09/15/15 also mildly dilated pancreatitic duct rec MRCP small sub cm cyst hemangioma speeln mild right hydronephrorossi and prox. hydroureter, kidney stones, mild scarring  kidneys  . Female stress incontinence   . Flank pain   . GERD (gastroesophageal reflux disease)    with small hiatal hernia   . Hard of hearing   . Heart disease   . History of kidney problems   . Hyperlipidemia    mixed  . Hypertension   . Hypothyroidism, postsurgical   . Impaired mobility and ADLs    uses rolling walker has caretaker 24/7 at home  . Leg edema   . Mixed incontinence urge and stress (female)(female)   . Neuropathy   . Osteoarthritis    DDD spine   . Osteoporosis with fracture    T12 compression fracture  . Photophobia   . Pulmonary embolism ([place])    10/2015 off coumadin as of 04/2016  . Pulmonary HTN (HCC)    mild pulm HTN, echo 10/09/15 EF 03-50%KXFGH 1 dd, RV systolic pressure increased   . Recurrent UTI   . Sinus pressure   . Skin cancer    BCC jawline and scalp   . Thyroid disease    follows [facility]  . TIA (transient ischemic attack)    MRI 2009/2010 neg stroke   . Trigeminal neuralgia    Dr. Tomi Bamberger s/p gamma knife x 2, on Tegretol since 2011/2012 no increase in dose >200 mg bid rec per family per neurology   . Urinary frequency   . Urinary, incontinence, stress female    Dr Erlene Quan urology     SURGICAL HISTORY: Past Surgical History:  Procedure Laterality Date  . APPENDECTOMY     as a child, open  . BRAIN SURGERY     schwnnoma removal 1996   . brain tumor surgery    . BREAST SURGERY     breast bx  . CATARACT EXTRACTION    . CHOLECYSTECTOMY    . EYE SURGERY     cataract  . IVC FILTER PLACEMENT ([facility] HX)     Dr. Lucky Cowboy 10/2015   . LAPAROSCOPIC TUBAL LIGATION    . MOHS SURGERY     scalp 04/2014   . PERIPHERAL VASCULAR CATHETERIZATION N/A 10/11/2015   Procedure: IVC Filter Insertion;  Surgeon: Algernon Huxley, MD;  Location: [place] [facility];  Service: Cardiovascular;  Laterality: N/A;  . PERIPHERAL VASCULAR THROMBECTOMY Bilateral 03/29/2018   Procedure: PERIPHERAL VASCULAR THROMBECTOMY;  Surgeon: Algernon Huxley, MD;  Location: [place] [facility];  Service: Cardiovascular;  Laterality: Bilateral;  . PUBOVAGINAL SLING    . THROAT SURGERY    . THYROID SURGERY     tumor around vocal cords   . TOOTH EXTRACTION     winter 2018   . TOTAL THYROIDECTOMY  1976    SOCIAL HISTORY: Social History   Socioeconomic History  . Marital status: Widowed    Spouse name: Not on file  . Number of children: 5  . Years of education: Not on file  . Highest education level: Not on file  Occupational History  . Occupation: retired  Scientific laboratory technician  . Financial resource strain: Not  hard at all  . Food insecurity    Worry: Never true    Inability: Never true  . Transportation needs    Medical: No    Non-medical: No  Tobacco Use  . Smoking status: Former Smoker    Packs/day: 0.50    Years: 20.00    Pack years: 10.00    Types: Cigarettes    Quit date: 09/20/1995    Years since quitting: 23.8  . Smokeless tobacco: Never Used  . Tobacco comment: quit 1996 smoked 20 years max 8 cig qd   Substance and Sexual Activity  . Alcohol use: No  . Drug use: No  . Sexual activity: Not on file  Lifestyle  . Physical activity    Days per week: Not on file    Minutes per session: Not on file  . Stress: Not at all  Relationships  . Social Herbalist on phone: Not on file    Gets together: Not on file    Attends religious service: Not on file    Active member of club or organization: Not on file    Attends meetings of clubs or organizations: Not on file    Relationship status: Not on file  . Intimate partner violence    Fear of current or ex partner: Not on file    Emotionally abused: Not on file    Physically abused: Not on file    Forced sexual activity: Not on file  Other Topics Concern  . Not on file  Social History Narrative   Lives at home with caretaker currently Sharee Pimple and [place]           FAMILY HISTORY: Family History  Problem Relation Age of Onset  . Heart disease Mother   . Diabetes Father   . Cancer  Daughter        breast ca x 2 s/p mastectomy     ALLERGIES:  is allergic to penicillins; sulfa antibiotics; amitiza [lubiprostone]; aspirin; and penicillin g.  MEDICATIONS:  Current Outpatient Medications  Medication Sig Dispense Refill  . acetaminophen (TYLENOL) 325 MG tablet Take 2 tablets (650 mg total) by mouth every 6 (six) hours as needed. 120 tablet 11  . acetaminophen-codeine (TYLENOL #3) 300-30 MG tablet Take 1 tablet by mouth 2 (two) times daily as needed for moderate pain. 10 tablet 0  . albuterol (PROVENTIL) (2.5 MG/3ML) 0.083% nebulizer solution Take 2.5 mg by nebulization 3 (three) times daily.    . AMBULATORY NON FORMULARY MEDICATION Medication Name: Please dispense flutter valve to use three times daily after nebulization. DX:J47.9 1 each 0  . atorvastatin (LIPITOR) 40 MG tablet Take 1 tablet (40 mg total) by mouth daily at 6 PM. Generic ok 90 tablet 3  . bisacodyl (DULCOLAX) 5 MG EC tablet Take 1 tablet (5 mg total) by mouth daily as needed for moderate constipation. 30 tablet 5  . budesonide-formoterol (SYMBICORT) 160-4.5 MCG/ACT inhaler Inhale 2 puffs into the lungs 2 (two) times daily. Rinse mouth 1 Inhaler 12  . Calcium Carbonate-Vitamin D (CALCIUM 600+D) 600-400 MG-UNIT tablet Take 1 tablet by mouth 2 (two) times daily. Lunch and dinner 180 tablet 3  . cetirizine (ZYRTEC) 10 MG tablet Take 10 mg by mouth daily.    Marland Kitchen DOK 100 MG capsule TAKE 1 CAPSULE BY MOUTH TWICE DAILY 180 capsule 3  . fluticasone (FLONASE) 50 MCG/ACT nasal spray Place 1-2 sprays into both nostrils daily. Max 2 sprays 16 g 11  . furosemide (LASIX)  20 MG tablet Take 1 tablet (20 mg total) by mouth daily. May take 2nd dose 20 mg if needed at lunch. Not reduced dose to use 1-2 x per day 180 tablet 3  . gabapentin (NEURONTIN) 100 MG capsule Take 1 capsule (100 mg total) by mouth 2 (two) times daily. 180 capsule 3  . guaiFENesin (MUCINEX) 600 MG 12 hr tablet Take 600 mg by mouth daily.     Marland Kitchen  ipratropium-albuterol (DUONEB) 0.5-2.5 (3) MG/3ML SOLN     . levothyroxine (SYNTHROID, LEVOTHROID) 200 MCG tablet Take 1 tablet (200 mcg total) by mouth daily before breakfast. Except on Sunday. Do not take with other medications or vitamins 90 tablet 1  . Melatonin 10 MG TABS Take by mouth.     . mirtazapine (REMERON) 15 MG tablet Take 1 tablet (15 mg total) by mouth at bedtime. 90 tablet 3  . Multiple Vitamin (MULTIVITAMIN WITH MINERALS) TABS tablet Take 1 tablet by mouth daily.    . ondansetron (ZOFRAN) 4 MG tablet Take 1 tablet (4 mg total) by mouth every 8 (eight) hours as needed for nausea or vomiting. 30 tablet 1  . pantoprazole (PROTONIX) 40 MG tablet Take 1 tablet (40 mg total) by mouth daily. 30 minutes before lunch or dinner 90 tablet 3  . polyethylene glycol powder (GLYCOLAX/MIRALAX) powder Take 17 g by mouth daily. 255 g 11  . polyvinyl alcohol (LIQUIFILM TEARS) 1.4 % ophthalmic solution Place 1 drop into both eyes as needed for dry eyes. 15 mL 11  . senna-docusate (SENOKOT-S) 8.6-50 MG tablet Take 1 tablet by mouth at bedtime as needed for mild constipation.    . sitaGLIPtin (JANUVIA) 50 MG tablet Take 1 tablet (50 mg total) by mouth daily. 90 tablet 3  . tiotropium (SPIRIVA) 18 MCG inhalation capsule Place 1 capsule (18 mcg total) into inhaler and inhale daily. 90 capsule 0  . apixaban (ELIQUIS) 2.5 MG TABS tablet Take 1 tablet (2.5 mg total) by mouth 2 (two) times daily. 60 tablet 3   No current facility-administered medications for this visit.      PHYSICAL EXAMINATION: ECOG PERFORMANCE STATUS: 1 - Symptomatic but completely ambulatory Vitals:   07/28/19 1044  BP: 126/75  Pulse: 76  Resp: 20  Temp: 98.7 F (37.1 C)   Filed Weights   07/28/19 1044  Weight: 190 lb 3.2 oz (86.3 kg)    Physical Exam Constitutional:      General: She is not in acute distress.    Appearance: She is well-developed.     Comments: Frail appearance elderly female, walks with walker  HENT:      Head: Normocephalic and atraumatic.  Eyes:     General: No scleral icterus.    Conjunctiva/sclera: Conjunctivae normal.     Pupils: Pupils are equal, round, and reactive to light.     Comments: pale  Neck:     Musculoskeletal: Normal range of motion and neck supple.  Cardiovascular:     Rate and Rhythm: Normal rate and regular rhythm.     Heart sounds: Normal heart sounds.  Pulmonary:     Effort: Pulmonary effort is normal. No respiratory distress.     Breath sounds: No wheezing.     Comments: Bibasilar crackles Abdominal:     General: Bowel sounds are normal.     Palpations: Abdomen is soft.  Musculoskeletal: Normal range of motion.        General: No deformity.     Comments: Trace bilateral lower extremity edema  Lymphadenopathy:     Cervical: No cervical adenopathy.  Skin:    General: Skin is warm and dry.     Findings: No erythema or rash.     Comments: A few bruises on bilateral upper extremity.  Neurological:     Mental Status: She is alert.     Coordination: Coordination normal.     Comments: Hearing deficiency.  Psychiatric:        Mood and Affect: Mood normal.      LABORATORY DATA:  I have reviewed the data as listed Lab Results  Component Value Date   WBC 6.3 07/28/2019   HGB 13.2 07/28/2019   HCT 41.0 07/28/2019   MCV 91.1 07/28/2019   PLT 219 07/28/2019   Recent Labs    12/11/18 1118 05/21/19 1404 07/28/19 0957  NA 143 144 141  K 3.7 4.4 4.1  CL 104 103 102  CO2 33* 33* 30  GLUCOSE 91 105* 105*  BUN 20 21 18   CREATININE 1.21* 1.41* 1.23*  CALCIUM 8.9 9.0 8.5*  GFRNONAA  --   --  38*  GFRAA  --   --  44*  PROT 6.6 6.6 7.0  ALBUMIN 3.6 3.9 3.3*  AST 21 21 21   ALT 15 17 14   ALKPHOS 52 66 63  BILITOT 0.4 0.4 0.5      ASSESSMENT & PLAN:  1. History of deep vein thrombosis (DVT) of lower extremity   2. Presence of IVC filter    #History of recurrent DVT, on chronic anticoagulation with Coumadin.  Tolerates well Patient and family  are interested to be switched to DOACs,  I called patient's daughter [name] who is a retired Marine scientist and discussed about management plan of switching between anticoagulants.  I think is reasonable to switch to Eliquis 2.5 mg twice daily as long-term maintenance anticoagulation regimen for her considering patient's age, fall risk, and a recent repeat ultrasound lower extremity showed no acute or chronic DVT.  Risk of bleeding was discussed with daughter and she voices understanding. Recommend obtaining INR today.  If INR is less than 2, okay to switch to Eliquis 2.5 mg twice daily. -Labs are reviewed.  INR 3.7. I called patient's daughter again, advised patient to hold Coumadin tonight and tomorrow, repeat INR on 07/30/2019.  She voices understanding and will relate to patient.  Eliquis 2.5 mg twice daily prescription was sent to pharmacy.  Eliquis first month coupon was provided to patient.  Orders Placed This Encounter  Procedures  . Protime-INR    Standing Status:   Future    Number of Occurrences:   1    Standing Expiration Date:   07/27/2020  . Protime-INR    Standing Status:   Standing    Number of Occurrences:   2    Standing Expiration Date:   07/27/2020    Follow-up to be determined.  We spent sufficient time to discuss many aspect of care, questions were answered to patient's satisfaction. Total face to face encounter time for this patient visit was 25 min. >50% of the time was  spent in counseling and coordination of care.   Earlie Server, MD, PhD Hematology Oncology [facility] at [facility] Pager- [phone] 07/28/2019

## 2019-07-29 ENCOUNTER — Other Ambulatory Visit: Payer: Self-pay | Admitting: Pharmacist

## 2019-07-29 DIAGNOSIS — Z86718 Personal history of other venous thrombosis and embolism: Secondary | ICD-10-CM

## 2019-07-29 MED ORDER — APIXABAN 2.5 MG PO TABS: 2.5000 mg | ORAL_TABLET | Freq: Two times a day (BID) | ORAL | 0 refills | Status: DC

## 2019-07-29 NOTE — Telephone Encounter (Signed)
LMTCB

## 2019-07-29 NOTE — Progress Notes (Signed)
Oral Chemotherapy Pharmacist Encounter   Ms. Pittinger was given a 30 day free trial boucher for her Eliquis from the office. Her normal pharmacy [facility] is not contracted with the voucher company and cannot bill the card for her.   The patient's daughter [name] called to let me know about this issue. We are sending a one time fill to a local [facility] for Ms. Chimenti so that she can use the voucher. After that she can go back to using her [facility] pharmacy for the refills.  Called [facility] to provide them with voucher information. [name] does not live locally and is worried that whoever picks up the medication for her mother may not bring the card.  Eliquis 30 day free trial voucher ID: [id number] BIN: [id number] Group: [id number] PCN: [place], PharmD, BCPS, [facility] Hematology/Oncology Clinical Pharmacist [facility]/HP/AP [facility] [phone]  07/29/2019 9:23 AM

## 2019-07-30 ENCOUNTER — Telehealth: Payer: Self-pay

## 2019-07-30 ENCOUNTER — Other Ambulatory Visit: Payer: Self-pay

## 2019-07-30 ENCOUNTER — Telehealth: Payer: Self-pay | Admitting: *Deleted

## 2019-07-30 ENCOUNTER — Inpatient Hospital Stay: Payer: Medicare Other

## 2019-07-30 DIAGNOSIS — Z86718 Personal history of other venous thrombosis and embolism: Secondary | ICD-10-CM | POA: Diagnosis not present

## 2019-07-30 LAB — PROTIME-INR
INR: 4 — ABNORMAL HIGH (ref 0.8–1.2)
Prothrombin Time: 38.3 seconds — ABNORMAL HIGH (ref 11.4–15.2)

## 2019-07-30 NOTE — Telephone Encounter (Signed)
She should have been started on eliquis did she start?   [place]

## 2019-07-30 NOTE — Telephone Encounter (Signed)
Is there something different you want to do with her coumadin orders since this was not able to be addressed to patient?

## 2019-07-30 NOTE — Telephone Encounter (Signed)
She has taken two doses of eliquis (one last night and one this morning). Per caregiver, she stopped coumadin Monday.

## 2019-07-30 NOTE — Telephone Encounter (Signed)
Copied from [place] [id number]. Topic: General - Inquiry >> Jul 30, 2019  3:37 PM Jessica Erickson wrote: Reason for CRM: [name], the Pt's care giver and the Pt called to see what her INR count was/ please call and advise/ if no answer she asked if a message with that count be left

## 2019-07-30 NOTE — Telephone Encounter (Signed)
This has already discussed with daughter, nancy.  Documented conversation in lab results.

## 2019-07-30 NOTE — Telephone Encounter (Signed)
Daughter called and reports that patient caregiver gave her her eliquis this morning even though she was told not to. She is also asking about the INR results from today and any new orders. Please return her call [phone]  Protime-INR Order: [id number] Status:  Final result  Visible to patient:  No (not released)  Next appt:  09/30/2019 at 10:30 AM in [facility] (OBrien-Blaney, Denisa L, LPN)  Dx:  History of deep vein thrombosis (DVT)...  Ref Range & Units 09:44  Prothrombin Time 11.4 - 15.2 seconds 38.3High    INR 0.8 - 1.2 4.0High    Comment: (NOTE)  INR goal varies based on device and disease states.  Performed at [facility], [address], [place],  [place] [address]   Resulting Agency  [facility]      Specimen Collected: 07/30/19 09:44 Last Resulted: 07/30/19 10:39

## 2019-07-30 NOTE — Telephone Encounter (Signed)
Unable to reach patient.

## 2019-07-31 ENCOUNTER — Ambulatory Visit: Payer: Medicare Other | Admitting: Internal Medicine

## 2019-07-31 ENCOUNTER — Telehealth: Payer: Self-pay

## 2019-07-31 NOTE — Telephone Encounter (Signed)
Copied from [place] [phone]. Topic: Quick Communication - See Telephone Encounter >> Jul 31, 2019  9:08 AM Loma [place] wrote: CRM for notification. See Telephone encounter for: 07/31/19. Jessica Erickson pt daughter needs to talk to nurse about pt. Her (mother) INR is being addressed and the caregiver has called and talked to Dr Olivia Mackie, with Dr Jeani Sow the  daughter has gotten her off the coumadin. The nurse (caregiver) is not happy and daughter says has given the wrong medicine and everything is mixed up. Daughter is a Marine scientist also and is concerned. CB to [place] [phone]

## 2019-07-31 NOTE — Telephone Encounter (Signed)
Spoke with patients daughter, confirmed that Dr Eino Farber is following INR for now to get her regulated so she can start on eliquis. Advised daughter to continue to follow with oncology regarding instructions on anticoagulant. Daughter gave verbal understanding. She wanted to make sure that care giver was not getting other instructions from our office.

## 2019-07-31 NOTE — Telephone Encounter (Signed)
See other note. I have talked with daughter

## 2019-08-01 ENCOUNTER — Inpatient Hospital Stay: Payer: Medicare Other

## 2019-08-01 ENCOUNTER — Other Ambulatory Visit: Payer: Self-pay | Admitting: Internal Medicine

## 2019-08-01 ENCOUNTER — Other Ambulatory Visit: Payer: Self-pay

## 2019-08-01 DIAGNOSIS — Z86718 Personal history of other venous thrombosis and embolism: Secondary | ICD-10-CM

## 2019-08-01 DIAGNOSIS — J449 Chronic obstructive pulmonary disease, unspecified: Secondary | ICD-10-CM

## 2019-08-01 LAB — PROTIME-INR
INR: 1.4 — ABNORMAL HIGH (ref 0.8–1.2)
Prothrombin Time: 17.1 seconds — ABNORMAL HIGH (ref 11.4–15.2)

## 2019-08-01 MED ORDER — TIOTROPIUM BROMIDE MONOHYDRATE 18 MCG IN CAPS: 18.0000 ug | ORAL_CAPSULE | Freq: Every day | RESPIRATORY_TRACT | 3 refills | Status: DC

## 2019-08-01 NOTE — Telephone Encounter (Signed)
Requested medication (s) are due for refill today: yes  Requested medication (s) are on the active medication list: yes  Last refill:   Future visit scheduled: yes  Notes to clinic:  Review for refill  Requested Prescriptions  Pending Prescriptions Disp Refills   budesonide-formoterol (SYMBICORT) 160-4.5 MCG/ACT inhaler 1 Inhaler 12    Sig: Inhale 2 puffs into the lungs 2 (two) times daily. Rinse mouth     There is no refill protocol information for this order

## 2019-08-01 NOTE — Telephone Encounter (Signed)
Medication:  tiotropium (SPIRIVA) 18 MCG inhalation capsule     Patient is requesting a refill.    [facility] - [place], [place] - [place] [phone] (Phone) [phone]

## 2019-08-04 ENCOUNTER — Telehealth: Payer: Self-pay | Admitting: Internal Medicine

## 2019-08-04 NOTE — Telephone Encounter (Signed)
Patients care taker Stanton Kidney was informed.  Patients caregiver understood and no questions, comments, or concerns at this time.

## 2019-08-04 NOTE — Telephone Encounter (Signed)
Copied from [place] [phone]. Topic: General - Other >> Aug 04, 2019  1:57 PM Keene Breath wrote: Reason for CRM: Called to report an out of range INR of 1.4.  Any questions, please call at [phone]

## 2019-08-04 NOTE — Telephone Encounter (Signed)
She should not be checking her INR any longer if off Coumadin  TMS

## 2019-08-08 MED ORDER — BUDESONIDE-FORMOTEROL FUMARATE 160-4.5 MCG/ACT IN AERO: 2.0000 | INHALATION_SPRAY | Freq: Two times a day (BID) | RESPIRATORY_TRACT | 12 refills | Status: DC

## 2019-09-03 ENCOUNTER — Ambulatory Visit: Payer: Medicare Other | Admitting: Internal Medicine

## 2019-09-12 ENCOUNTER — Ambulatory Visit: Payer: Self-pay | Admitting: *Deleted

## 2019-09-12 NOTE — Telephone Encounter (Signed)
Care taker and pt have been informed and stated they will go.

## 2019-09-12 NOTE — Telephone Encounter (Signed)
[facility]  Call daughter Jessica Erickson and patient     [place]

## 2019-09-12 NOTE — Telephone Encounter (Signed)
Called to check on patient since I didn't notice an appt had been scheduled at [facility].  Patient was with her caregiver Stanton Kidney and could hear and read from her phone.  Stanton Kidney said that pt did not want to go to [place] location.  Pt rates her pain a 7 out of 10.  Pt  took 2 tylenol this morning for pain per [facility].  Informed pt that I will forward notes over for Dr. Claris Gladden review.

## 2019-09-12 NOTE — Telephone Encounter (Signed)
Message from Scherrie Gerlach sent at 09/12/2019 10:49 AM EDT  Daughter calling to make appt for the pt. Pt is not with her. Pt is having pain in her side to her hip and does not want to go to ED. Daughter does not know how bad she is hurting. Pt is with her caregiver. She states the best thing is have the nurse call her back, because she is not sure if pt needs to go to hospital. Pt declined ED due to thinking covid and wanted her dr to see her instead. caregiver name is Stanton Kidney, home phone # [phone]

## 2019-09-12 NOTE — Telephone Encounter (Signed)
Patient call was transferred to [facility] to schedule an appointment due to [facility] being full. Patient and caregiver was given an appointment for today and after giving patient the address patient declined and stated that she could not come to [place].

## 2019-09-12 NOTE — Telephone Encounter (Signed)
  Reason for Disposition . [1] MODERATE pain (e.g., interferes with normal activities, limping) AND [2] present > 3 days  Answer Assessment - Initial Assessment Questions 1. LOCATION and RADIATION: "Where is the pain located?"      Right side hip under buttocks  2. QUALITY: "What does the pain feel like?"  (e.g., sharp, dull, aching, burning)     Feels like cramping  3. SEVERITY: "How bad is the pain?" "What does it keep you from doing?"   (Scale 1-10; or mild, moderate, severe)   -  MILD (1-3): doesn't interfere with normal activities    -  MODERATE (4-7): interferes with normal activities (e.g., work or school) or awakens from sleep, limping    -  SEVERE (8-10): excruciating pain, unable to do any normal activities, unable to walk 7-moderate      4. ONSET: "When did the pain start?" "Does it come and go, or is it there all the time?"     Started last night  5. WORK OR EXERCISE: "Has there been any recent work or exercise that involved this part of the body?"      Rode exercise bike Wednesday evening 6. CAUSE: "What do you think is causing the hip pain?"      Unsure  7. AGGRAVATING FACTORS: "What makes the hip pain worse?" (e.g., walking, climbing stairs, running)     Walking  8. OTHER SYMPTOMS: "Do you have any other symptoms?" (e.g., back pain, pain shooting down leg,  fever, rash)     Pain goes down right leg  Protocols used: HIP PAIN-A-AH   Spoke with patient's daughter Jessica Erickson, who advised me to call patient's caregiver Jessica Erickson, who is with patient right now.  Called Jessica Erickson- she states patient is having hip pain that starts below her right buttocks and down her right leg.  Pain started last night, and patient rates pain as 7/10 on pain scale.  She has taken 2 tylenol this morning per [facility], and pain persists.  Pain is worse with walking.  Denies fever or rash.  Advised patient should be seen for an appointment for evaluation.  Called PCP office, they are full today.  Scheduler recommended  [facility] as they have openings today.  Transferred call to [facility] at [facility] to schedule appointment.

## 2019-09-16 ENCOUNTER — Other Ambulatory Visit: Payer: Self-pay | Admitting: Internal Medicine

## 2019-09-16 DIAGNOSIS — E039 Hypothyroidism, unspecified: Secondary | ICD-10-CM

## 2019-09-16 MED ORDER — LEVOTHYROXINE SODIUM 200 MCG PO TABS: 200.0000 ug | ORAL_TABLET | Freq: Every day | ORAL | 3 refills | Status: DC

## 2019-09-23 ENCOUNTER — Other Ambulatory Visit: Payer: Self-pay | Admitting: Internal Medicine

## 2019-09-23 DIAGNOSIS — G500 Trigeminal neuralgia: Secondary | ICD-10-CM

## 2019-09-23 MED ORDER — GABAPENTIN 100 MG PO CAPS: 100.0000 mg | ORAL_CAPSULE | Freq: Two times a day (BID) | ORAL | 3 refills | Status: DC

## 2019-09-26 ENCOUNTER — Other Ambulatory Visit: Payer: Self-pay

## 2019-09-26 ENCOUNTER — Other Ambulatory Visit: Payer: Self-pay | Admitting: Internal Medicine

## 2019-09-26 DIAGNOSIS — J449 Chronic obstructive pulmonary disease, unspecified: Secondary | ICD-10-CM

## 2019-09-26 MED ORDER — IPRATROPIUM-ALBUTEROL 0.5-2.5 (3) MG/3ML IN SOLN: 3.0000 mL | RESPIRATORY_TRACT | 12 refills | Status: DC | PRN

## 2019-09-29 ENCOUNTER — Ambulatory Visit (INDEPENDENT_AMBULATORY_CARE_PROVIDER_SITE_OTHER): Payer: Medicare Other

## 2019-09-29 ENCOUNTER — Other Ambulatory Visit: Payer: Self-pay

## 2019-09-29 DIAGNOSIS — Z0000 Encounter for general adult medical examination without abnormal findings: Secondary | ICD-10-CM

## 2019-09-29 NOTE — Progress Notes (Signed)
Subjective:   Jessica Erickson is a 85 y.o. female who presents for Medicare Annual (Subsequent) preventive examination.  Review of Systems:  No ROS.  Medicare Wellness Virtual Visit.  Visual/audio telehealth visit, UTA vital signs.   See social history for additional risk factors.   Cardiac Risk Factors include: advanced age (>21men, >40 women);hypertension;diabetes mellitus     Objective:     Vitals: LMP  (LMP Unknown)   There is no height or weight on file to calculate BMI.  Advanced Directives 09/29/2019 07/28/2019 01/28/2019 09/19/2018 07/30/2018 05/22/2018 05/15/2018  Does Patient Have a Medical Advance Directive? Yes Yes Yes Yes Yes Yes Yes  Type of Paramedic of [place];Living will [place];Living will Living will;Healthcare Power of Attorney - - -  Does patient want to make changes to medical advance directive? No - Patient declined - - No - Patient declined - - -  Copy of [place] in Chart? No - copy requested - No - copy requested No - copy requested - - -  Would patient like information on creating a medical advance directive? - - - - - - -    Tobacco Social History   Tobacco Use  Smoking Status Former Smoker   Packs/day: 0.50   Years: 20.00   Pack years: 10.00   Types: Cigarettes   Quit date: 09/20/1995   Years since quitting: 24.0  Smokeless Tobacco Never Used  Tobacco Comment   quit 1996 smoked 20 years max 8 cig qd      Counseling given: Not Answered Comment: quit 1996 smoked 20 years max 8 cig qd    Clinical Intake:  Pre-visit preparation completed: Yes        Diabetes: Yes  How often do you need to have someone help you when you read instructions, pamphlets, or other written materials from your doctor or pharmacy?: 4 - Often        Past Medical History:  Diagnosis Date   Acoustic neuroma ([place])    Allergy    Asthma    Bilateral swelling  of feet    and legs   Bladder infection    CAD (coronary artery disease)    Cataract    Change in voice    Compression fracture of body of thoracic vertebra ([place])    T12 09/18/15 MRI s/p fall    Constipation    COPD (chronic obstructive pulmonary disease) ([place])    previous CXR with chronic interstitial lung dz    CVA (cerebral vascular accident) ([place])    Depression    Diabetes ([place])    with neuropathy   Diabetes mellitus, type 2 ([place])    Diarrhea    Double vision    DVT (deep venous thrombosis) ([place])    right leg 10/2015 was on coumadin off as of 2017/2018 ; s/p IVC filter   Enuresis    Eye pain, right    Fall    Fatty liver    09/15/15 also mildly dilated pancreatitic duct rec MRCP small sub cm cyst hemangioma speeln mild right hydronephrorossi and prox. hydroureter, kidney stones, mild scarring kidneys   Female stress incontinence    Flank pain    GERD (gastroesophageal reflux disease)    with small hiatal hernia    Hard of hearing    Heart disease    History of kidney problems    Hyperlipidemia    mixed   Hypertension  Hypothyroidism, postsurgical    Impaired mobility and ADLs    uses rolling walker has caretaker 24/7 at home   Leg edema    Mixed incontinence urge and stress (female)(female)    Neuropathy    Osteoarthritis    DDD spine    Osteoporosis with fracture    T12 compression fracture   Photophobia    Pulmonary embolism ([place])    10/2015 off coumadin as of 04/2016   Pulmonary HTN (H[facility])    mild pulm HTN, echo 10/09/15 EF 61-60%VPXTG 1 dd, RV systolic pressure increased    Recurrent UTI    Sinus pressure    Skin cancer    BCC jawline and scalp    Thyroid disease    follows [facility]   TIA (transient ischemic attack)    MRI 2009/2010 neg stroke    Trigeminal neuralgia    Dr. Tomi Bamberger s/p gamma knife x 2, on Tegretol since 2011/2012 no increase in dose >200 mg bid rec per family per neurology    Urinary  frequency    Urinary, incontinence, stress female    Dr Erlene Quan urology    Past Surgical History:  Procedure Laterality Date   APPENDECTOMY     as a child, open   [place] removal 1996    brain tumor surgery     BREAST SURGERY     breast bx   CATARACT EXTRACTION     CHOLECYSTECTOMY     EYE SURGERY     cataract   IVC FILTER PLACEMENT ([place] HX)     Dr. Lucky Cowboy 10/2015    LAPAROSCOPIC TUBAL LIGATION     MOHS SURGERY     scalp 04/2014    PERIPHERAL VASCULAR CATHETERIZATION N/A 10/11/2015   Procedure: IVC Filter Insertion;  Surgeon: Algernon Huxley, MD;  Location: [place] [facility];  Service: Cardiovascular;  Laterality: N/A;   PERIPHERAL VASCULAR THROMBECTOMY Bilateral 03/29/2018   Procedure: PERIPHERAL VASCULAR THROMBECTOMY;  Surgeon: Algernon Huxley, MD;  Location: [place] [facility];  Service: Cardiovascular;  Laterality: Bilateral;   PUBOVAGINAL SLING     THROAT SURGERY     THYROID SURGERY     tumor around vocal cords    TOOTH EXTRACTION     winter 2018    TOTAL THYROIDECTOMY  1976   Family History  Problem Relation Age of Onset   Heart disease Mother    Diabetes Father    Cancer Daughter        breast ca x 2 s/p mastectomy    Social History   Socioeconomic History   Marital status: Widowed    Spouse name: Not on file   Number of children: 5   Years of education: Not on file   Highest education level: Not on file  Occupational History   Occupation: retired  Scientist, product/process development strain: Not hard at all   Food insecurity    Worry: Never true    Inability: Never true   Transportation needs    Medical: No    Non-medical: No  Tobacco Use   Smoking status: Former Smoker    Packs/day: 0.50    Years: 20.00    Pack years: 10.00    Types: Cigarettes    Quit date: 09/20/1995    Years since quitting: 24.0   Smokeless tobacco: Never Used   Tobacco comment: quit 1996 smoked 20 years max 8 cig qd   Substance  and Sexual  Activity   Alcohol use: No   Drug use: No   Sexual activity: Not on file  Lifestyle   Physical activity    Days per week: 4 days    Minutes per session: 40 min   Stress: Not at all  Relationships   Social connections    Talks on phone: Not on file    Gets together: Not on file    Attends religious service: Not on file    Active member of club or organization: Not on file    Attends meetings of clubs or organizations: Not on file    Relationship status: Not on file  Other Topics Concern   Not on file  Social History Narrative   Lives at home with caretaker currently Sharee Pimple and [place]           Outpatient Encounter Medications as of 09/29/2019  Medication Sig   apixaban (ELIQUIS) 2.5 MG TABS tablet Take 1 tablet (2.5 mg total) by mouth 2 (two) times daily.   acetaminophen (TYLENOL) 325 MG tablet Take 2 tablets (650 mg total) by mouth every 6 (six) hours as needed.   acetaminophen-codeine (TYLENOL #3) 300-30 MG tablet Take 1 tablet by mouth 2 (two) times daily as needed for moderate pain.   albuterol (PROVENTIL) (2.5 MG/3ML) 0.083% nebulizer solution Take 2.5 mg by nebulization 3 (three) times daily.   AMBULATORY NON FORMULARY MEDICATION Medication Name: Please dispense flutter valve to use three times daily after nebulization. DX:J47.9   atorvastatin (LIPITOR) 40 MG tablet Take 1 tablet (40 mg total) by mouth daily at 6 PM. Generic ok   bisacodyl (DULCOLAX) 5 MG EC tablet Take 1 tablet (5 mg total) by mouth daily as needed for moderate constipation.   budesonide-formoterol (SYMBICORT) 160-4.5 MCG/ACT inhaler Inhale 2 puffs into the lungs 2 (two) times daily. Rinse mouth   Calcium Carbonate-Vitamin D (CALCIUM 600+D) 600-400 MG-UNIT tablet Take 1 tablet by mouth 2 (two) times daily. Lunch and dinner   cetirizine (ZYRTEC) 10 MG tablet Take 10 mg by mouth daily.   DOK 100 MG capsule TAKE 1 CAPSULE BY MOUTH TWICE DAILY   fluticasone (FLONASE) 50 MCG/ACT  nasal spray Place 1-2 sprays into both nostrils daily. Max 2 sprays   furosemide (LASIX) 20 MG tablet Take 1 tablet (20 mg total) by mouth daily. May take 2nd dose 20 mg if needed at lunch. Not reduced dose to use 1-2 x per day   gabapentin (NEURONTIN) 100 MG capsule Take 1 capsule (100 mg total) by mouth 2 (two) times daily.   guaiFENesin (MUCINEX) 600 MG 12 hr tablet Take 600 mg by mouth daily.    ipratropium-albuterol (DUONEB) 0.5-2.5 (3) MG/3ML SOLN Inhale 3 mLs into the lungs every 4 (four) hours as needed.   levothyroxine (SYNTHROID) 200 MCG tablet Take 1 tablet (200 mcg total) by mouth daily before breakfast. Except on Sunday. Do not take with other medications or vitamins   Melatonin 10 MG TABS Take by mouth.    meloxicam (MOBIC) 15 MG tablet meloxicam 15 mg tablet   mirtazapine (REMERON) 15 MG tablet Take 1 tablet (15 mg total) by mouth at bedtime.   Multiple Vitamin (MULTIVITAMIN WITH MINERALS) TABS tablet Take 1 tablet by mouth daily.   ondansetron (ZOFRAN) 4 MG tablet Take 1 tablet (4 mg total) by mouth every 8 (eight) hours as needed for nausea or vomiting.   pantoprazole (PROTONIX) 40 MG tablet Take 1 tablet (40 mg total) by mouth daily. 30 minutes before lunch  or dinner   polyethylene glycol powder (GLYCOLAX/MIRALAX) powder Take 17 g by mouth daily.   polyvinyl alcohol (LIQUIFILM TEARS) 1.4 % ophthalmic solution Place 1 drop into both eyes as needed for dry eyes.   senna-docusate (SENOKOT-S) 8.6-50 MG tablet Take 1 tablet by mouth at bedtime as needed for mild constipation.   sitaGLIPtin (JANUVIA) 50 MG tablet Take 1 tablet (50 mg total) by mouth daily.   tiotropium (SPIRIVA) 18 MCG inhalation capsule Place 1 capsule (18 mcg total) into inhaler and inhale daily.   No facility-administered encounter medications on file as of 09/29/2019.     Activities of Daily Living In your present state of health, do you have any difficulty performing the following activities:  09/29/2019  Hearing? Y  Comment R ear hearing loss  Vision? N  Difficulty concentrating or making decisions? N  Walking or climbing stairs? Y  Comment Unsteady gait. Walker in use.  Dressing or bathing? N  Doing errands, shopping? Y  Comment She does not Physiological scientist and eating ? Y  Using the Toilet? N  In the past six months, have you accidently leaked urine? Y  Comment Managed with daily pad  Do you have problems with loss of bowel control? N  Managing your Medications? Y  Comment Caregiver assist  Managing your Finances? Y  Comment Son Writer or managing your Housekeeping? Y  Comment Caregiver assist  Some recent data might be hidden    Patient Care Team: McLean-Scocuzza, Nino Glow, MD as PCP - General (Internal Medicine)    Assessment:   This is a routine wellness examination for Elidia.  Nurse connected with patient 09/29/19 at 11:30 AM EDT by a telephone enabled telemedicine application and verified that I am speaking with the correct person using two identifiers. Patient stated full name and DOB. Patient gave permission to continue with virtual visit. Patient's location was at home and Nurse's location was at [place] office.   Health Maintenance Due: -Influenza vaccine 2020- discussed; to be completed in season with doctor or local pharmacy.   -Dexa Scan- deferred for follow up with pcp per patient preference -Foot Exam- denies wounds or breaks in the skin. She does not walk barefoot. Followed by pcp.  -Hgb A1c- 05/21/19 (6.4)  Update all pending maintenance due as appropriate.   See completed HM at the end of note.   Eye: Visual acuity not assessed. Virtual visit. Wears corrective lenses. Followed by their ophthalmologist every 12 months.  Retinopathy- none reported  Dental: Visits every 6 months.    Hearing: Demonstrates normal hearing during visit. Hearing aids- R ear  Safety:  Patient feels safe at home- yes Patient does have smoke  detectors at home- yes Patient does wear sunscreen or protective clothing when in direct sunlight - yes Patient does wear seat belt when in a moving vehicle - yes Patient drives- no Adequate lighting in walkways free from debris- yes Grab bars and handrails used as appropriate- yes Ambulates with walker as an assistive device Caregiver in the home 24/7.  Social: Alcohol intake - no     Smoking history- former  Smokers in home? none Illicit drug use? none  Depression: PHQ 2 &9 complete. See screening below. Denies irritability, anhedonia, sadness/tearfullness.  Stable.   Falls: See screening below.    Medication: Taking as directed and without issues. Caregiver assist.  Covid-19: Precautions and sickness symptoms discussed. Wears mask, social distancing, hand hygiene as appropriate.   Activities of Daily Living  Patient denies needing assistance with: feeding themselves, getting from bed to chair, getting to the toilet, bathing/showering, dressing.  Assisted by caregivers with household chores, meal prep and medication management. Son assists with money management as needed.    Memory: Patient is alert. Patient denies difficulty focusing or concentrating. Patient likes to read and sew for brain stimulation.   BMI- discussed the importance of a healthy diet, water intake and the benefits of aerobic exercise.  Educational material provided.  Physical activity- stationary bike daily for 2 miles  Diet:  Regular Water: good intake Ensure/Protein supplement: yes  Other Providers Patient Care Team: McLean-Scocuzza, Nino Glow, MD as PCP - General (Internal Medicine)   Exercise Activities and Dietary recommendations Current Exercise Habits: Home exercise routine, Type of exercise: calisthenics, Time (Minutes): 20, Frequency (Times/Week): 4, Weekly Exercise (Minutes/Week): 80, Intensity: Mild  Goals     Follow up with Primary Care Provider     As needed       Fall  Risk Fall Risk  09/29/2019 05/21/2019 09/19/2018 08/29/2018 05/17/2018  Falls in the past year? 0 0 No No No   Timed Get Up and Go performed: no, virtual visit  Depression Screen PHQ 2/9 Scores 09/29/2019 09/19/2018 08/29/2018 06/04/2018  PHQ - 2 Score 0 0 0 2  PHQ- 9 Score - - - 4     Cognitive Function     6CIT Screen 09/19/2018  What Year? 0 points  What month? 0 points  What time? 0 points  Count back from 20 0 points  Months in reverse (No Data)    Immunization History  Administered Date(s) Administered   Influenza, High Dose Seasonal PF 09/19/2018   Influenza-Unspecified 08/05/2015, 09/02/2017   Pneumococcal Polysaccharide-23 08/29/2018   Screening Tests Health Maintenance  Topic Date Due   DEXA SCAN  04/22/1993   INFLUENZA VACCINE  07/05/2019   HEMOGLOBIN A1C  11/20/2019   FOOT EXAM  01/14/2020   OPHTHALMOLOGY EXAM  04/24/2020   URINE MICROALBUMIN  05/21/2020   TETANUS/TDAP  08/04/2026   PNA vac Low Risk Adult  Completed      Plan:   Keep all routine maintenance appointments.   Follow up 09/30/19  Medicare Attestation I have personally reviewed: The patient's medical and social history Their use of alcohol, tobacco or illicit drugs Their current medications and supplements The patient's functional ability including ADLs,fall risks, home safety risks, cognitive, and hearing and visual impairment Diet and physical activities Evidence for depression   In addition, I have reviewed and discussed with patient certain preventive protocols, quality metrics, and best practice recommendations. A written personalized care plan for preventive services as well as general preventive health recommendations were provided to patient via mail.     Varney Biles, LPN  [phone]

## 2019-09-29 NOTE — Patient Instructions (Addendum)
  Jessica Erickson , Thank you for taking time to come for your Medicare Wellness Visit. I appreciate your ongoing commitment to your health goals. Please review the following plan we discussed and let me know if I can assist you in the future.   These are the goals we discussed: Goals    . Follow up with Primary Care Provider     As needed       This is a list of the screening recommended for you and due dates:  Health Maintenance  Topic Date Due  . DEXA scan (bone density measurement)  04/22/1993  . Flu Shot  07/05/2019  . Hemoglobin A1C  11/20/2019  . Complete foot exam   01/14/2020  . Eye exam for diabetics  04/24/2020  . Urine Protein Check  05/21/2020  . Tetanus Vaccine  08/04/2026  . Pneumonia vaccines  Completed

## 2019-09-30 ENCOUNTER — Ambulatory Visit (INDEPENDENT_AMBULATORY_CARE_PROVIDER_SITE_OTHER): Payer: Medicare Other | Admitting: Internal Medicine

## 2019-09-30 ENCOUNTER — Ambulatory Visit: Payer: Medicare Other

## 2019-09-30 ENCOUNTER — Other Ambulatory Visit: Payer: Self-pay

## 2019-09-30 ENCOUNTER — Encounter: Payer: Self-pay | Admitting: Internal Medicine

## 2019-09-30 VITALS — BP 126/76 | HR 78 | Temp 98.1°F | Ht 67.0 in | Wt 197.4 lb

## 2019-09-30 DIAGNOSIS — E119 Type 2 diabetes mellitus without complications: Secondary | ICD-10-CM | POA: Diagnosis not present

## 2019-09-30 DIAGNOSIS — Z23 Encounter for immunization: Secondary | ICD-10-CM | POA: Diagnosis not present

## 2019-09-30 DIAGNOSIS — J449 Chronic obstructive pulmonary disease, unspecified: Secondary | ICD-10-CM

## 2019-09-30 DIAGNOSIS — M1990 Unspecified osteoarthritis, unspecified site: Secondary | ICD-10-CM | POA: Insufficient documentation

## 2019-09-30 DIAGNOSIS — M25559 Pain in unspecified hip: Secondary | ICD-10-CM | POA: Diagnosis not present

## 2019-09-30 DIAGNOSIS — M545 Low back pain, unspecified: Secondary | ICD-10-CM

## 2019-09-30 DIAGNOSIS — Z86718 Personal history of other venous thrombosis and embolism: Secondary | ICD-10-CM | POA: Diagnosis not present

## 2019-09-30 DIAGNOSIS — H9193 Unspecified hearing loss, bilateral: Secondary | ICD-10-CM

## 2019-09-30 LAB — POCT GLYCOSYLATED HEMOGLOBIN (HGB A1C)
HbA1c POC (<> result, manual entry): 6 % (ref 4.0–5.6)
HbA1c, POC (controlled diabetic range): 6 % (ref 0.0–7.0)
HbA1c, POC (prediabetic range): 6 % (ref 5.7–6.4)
Hemoglobin A1C: 6 % — AB (ref 4.0–5.6)

## 2019-09-30 MED ORDER — MELOXICAM 15 MG PO TABS: 15.0000 mg | ORAL_TABLET | Freq: Every day | ORAL | 11 refills | Status: DC | PRN

## 2019-09-30 MED ORDER — APIXABAN 2.5 MG PO TABS: 2.5000 mg | ORAL_TABLET | Freq: Two times a day (BID) | ORAL | 3 refills | Status: DC

## 2019-09-30 NOTE — Progress Notes (Signed)
Chief Complaint  Patient presents with  . Follow-up  . Medication Refill   F/u with caretaker no complaints  1. DM 2 POC A1C 6.0 today on januvia 50 mg qd caretaker reports she is eating sweets at home I.e cake  2. Low back pain and hip pain emerge ortho did Xray and dx'ed with arthritis and given mobic 15 mg which she wants refill of she has been taking daily disc with patient and caretaker this with eliquis could increase blood thinner levels, affect kidney #s and increased risk of bleeding and rec use tylenol and mobic 15 mg sparingly  She is still able to exercise and rides her bike daily  3.copd controlled using inhalers, neb tx and flutter valve daily  4. Hard of hearing esp on left ear some hearing on right but limited   Review of Systems  Constitutional: Negative for weight loss.  HENT: Positive for hearing loss.   Eyes: Negative for blurred vision.  Respiratory: Negative for shortness of breath.   Cardiovascular: Negative for chest pain.  Gastrointestinal: Negative for abdominal pain.  Musculoskeletal: Negative for back pain and joint pain.  Skin: Negative for rash.  Neurological: Negative for headaches.  Psychiatric/Behavioral: Negative for depression and memory loss.   Past Medical History:  Diagnosis Date  . Acoustic neuroma ([place])   . Allergy   . Asthma   . Bilateral swelling of feet    and legs  . Bladder infection   . CAD (coronary artery disease)   . Cataract   . Change in voice   . Compression fracture of body of thoracic vertebra (HCC)    T12 09/18/15 MRI s/p fall   . Constipation   . COPD (chronic obstructive pulmonary disease) ([place])    previous CXR with chronic interstitial lung dz   . CVA (cerebral vascular accident) ([place])   . Depression   . Diabetes ([place])    with neuropathy  . Diabetes mellitus, type 2 ([place])   . Diarrhea   . Double vision   . DVT (deep venous thrombosis) ([place])    right leg 10/2015 was on coumadin off as of 2017/2018 ; s/p IVC  filter  . Enuresis   . Eye pain, right   . Fall   . Fatty liver    09/15/15 also mildly dilated pancreatitic duct rec MRCP small sub cm cyst hemangioma speeln mild right hydronephrorossi and prox. hydroureter, kidney stones, mild scarring kidneys  . Female stress incontinence   . Flank pain   . GERD (gastroesophageal reflux disease)    with small hiatal hernia   . Hard of hearing   . Heart disease   . History of kidney problems   . Hyperlipidemia    mixed  . Hypertension   . Hypothyroidism, postsurgical   . Impaired mobility and ADLs    uses rolling walker has caretaker 24/7 at home  . Leg edema   . Mixed incontinence urge and stress (female)(female)   . Neuropathy   . Osteoarthritis    DDD spine   . Osteoporosis with fracture    T12 compression fracture  . Photophobia   . Pulmonary embolism ([place])    10/2015 off coumadin as of 04/2016  . Pulmonary HTN (HCC)    mild pulm HTN, echo 10/09/15 EF 85-27%POEUM 1 dd, RV systolic pressure increased   . Recurrent UTI   . Sinus pressure   . Skin cancer    BCC jawline and scalp   . Thyroid disease  follows [facility]  . TIA (transient ischemic attack)    MRI 2009/2010 neg stroke   . Trigeminal neuralgia    Dr. Tomi Bamberger s/p gamma knife x 2, on Tegretol since 2011/2012 no increase in dose >200 mg bid rec per family per neurology   . Urinary frequency   . Urinary, incontinence, stress female    Dr Erlene Quan urology    Past Surgical History:  Procedure Laterality Date  . APPENDECTOMY     as a child, open  . BRAIN SURGERY     schwnnoma removal 1996   . brain tumor surgery    . BREAST SURGERY     breast bx  . CATARACT EXTRACTION    . CHOLECYSTECTOMY    . EYE SURGERY     cataract  . IVC FILTER PLACEMENT ([facility] HX)     Dr. Lucky Cowboy 10/2015   . LAPAROSCOPIC TUBAL LIGATION    . MOHS SURGERY     scalp 04/2014   . PERIPHERAL VASCULAR CATHETERIZATION N/A 10/11/2015   Procedure: IVC Filter Insertion;  Surgeon: Algernon Huxley, MD;   Location: [facility];  Service: Cardiovascular;  Laterality: N/A;  . PERIPHERAL VASCULAR THROMBECTOMY Bilateral 03/29/2018   Procedure: PERIPHERAL VASCULAR THROMBECTOMY;  Surgeon: Algernon Huxley, MD;  Location: [facility];  Service: Cardiovascular;  Laterality: Bilateral;  . PUBOVAGINAL SLING    . THROAT SURGERY    . THYROID SURGERY     tumor around vocal cords   . TOOTH EXTRACTION     winter 2018   . TOTAL THYROIDECTOMY  1976   Family History  Problem Relation Age of Onset  . Heart disease Mother   . Diabetes Father   . Cancer Daughter        breast ca x 2 s/p mastectomy    Social History   Socioeconomic History  . Marital status: Widowed    Spouse name: Not on file  . Number of children: 5  . Years of education: Not on file  . Highest education level: Not on file  Occupational History  . Occupation: retired  Scientific laboratory technician  . Financial resource strain: Not hard at all  . Food insecurity    Worry: Never true    Inability: Never true  . Transportation needs    Medical: No    Non-medical: No  Tobacco Use  . Smoking status: Former Smoker    Packs/day: 0.50    Years: 20.00    Pack years: 10.00    Types: Cigarettes    Quit date: 09/20/1995    Years since quitting: 24.0  . Smokeless tobacco: Never Used  . Tobacco comment: quit 1996 smoked 20 years max 8 cig qd   Substance and Sexual Activity  . Alcohol use: No  . Drug use: No  . Sexual activity: Not on file  Lifestyle  . Physical activity    Days per week: 4 days    Minutes per session: 40 min  . Stress: Not at all  Relationships  . Social Herbalist on phone: Not on file    Gets together: Not on file    Attends religious service: Not on file    Active member of club or organization: Not on file    Attends meetings of clubs or organizations: Not on file    Relationship status: Not on file  . Intimate partner violence    Fear of current or ex partner: Not on file  Emotionally  abused: Not on file    Physically abused: Not on file    Forced sexual activity: Not on file  Other Topics Concern  . Not on file  Social History Narrative   Lives at home with caretaker currently Sharee Pimple and Inez Catalina          Current Meds  Medication Sig  . acetaminophen (TYLENOL) 325 MG tablet Take 2 tablets (650 mg total) by mouth every 6 (six) hours as needed.  Marland Kitchen acetaminophen-codeine (TYLENOL #3) 300-30 MG tablet Take 1 tablet by mouth 2 (two) times daily as needed for moderate pain.  Marland Kitchen albuterol (PROVENTIL) (2.5 MG/3ML) 0.083% nebulizer solution Take 2.5 mg by nebulization 3 (three) times daily.  . AMBULATORY NON FORMULARY MEDICATION Medication Name: Please dispense flutter valve to use three times daily after nebulization. DX:J47.9  . apixaban (ELIQUIS) 2.5 MG TABS tablet Take 1 tablet (2.5 mg total) by mouth 2 (two) times daily.  Marland Kitchen atorvastatin (LIPITOR) 40 MG tablet Take 1 tablet (40 mg total) by mouth daily at 6 PM. Generic ok  . bisacodyl (DULCOLAX) 5 MG EC tablet Take 1 tablet (5 mg total) by mouth daily as needed for moderate constipation.  . budesonide-formoterol (SYMBICORT) 160-4.5 MCG/ACT inhaler Inhale 2 puffs into the lungs 2 (two) times daily. Rinse mouth  . Calcium Carbonate-Vitamin D (CALCIUM 600+D) 600-400 MG-UNIT tablet Take 1 tablet by mouth 2 (two) times daily. Lunch and dinner  . cetirizine (ZYRTEC) 10 MG tablet Take 10 mg by mouth daily.  Marland Kitchen DOK 100 MG capsule TAKE 1 CAPSULE BY MOUTH TWICE DAILY  . fluticasone (FLONASE) 50 MCG/ACT nasal spray Place 1-2 sprays into both nostrils daily. Max 2 sprays  . furosemide (LASIX) 20 MG tablet Take 1 tablet (20 mg total) by mouth daily. May take 2nd dose 20 mg if needed at lunch. Not reduced dose to use 1-2 x per day  . gabapentin (NEURONTIN) 100 MG capsule Take 1 capsule (100 mg total) by mouth 2 (two) times daily.  Marland Kitchen guaiFENesin (MUCINEX) 600 MG 12 hr tablet Take 600 mg by mouth daily.   Marland Kitchen ipratropium-albuterol (DUONEB) 0.5-2.5  (3) MG/3ML SOLN Inhale 3 mLs into the lungs every 4 (four) hours as needed.  Marland Kitchen levothyroxine (SYNTHROID) 200 MCG tablet Take 1 tablet (200 mcg total) by mouth daily before breakfast. Except on Sunday. Do not take with other medications or vitamins  . Melatonin 10 MG TABS Take by mouth.   . meloxicam (MOBIC) 15 MG tablet Take 1 tablet (15 mg total) by mouth daily as needed for pain.  . mirtazapine (REMERON) 15 MG tablet Take 1 tablet (15 mg total) by mouth at bedtime.  . Multiple Vitamin (MULTIVITAMIN WITH MINERALS) TABS tablet Take 1 tablet by mouth daily.  . ondansetron (ZOFRAN) 4 MG tablet Take 1 tablet (4 mg total) by mouth every 8 (eight) hours as needed for nausea or vomiting.  . pantoprazole (PROTONIX) 40 MG tablet Take 1 tablet (40 mg total) by mouth daily. 30 minutes before lunch or dinner  . polyethylene glycol powder (GLYCOLAX/MIRALAX) powder Take 17 g by mouth daily.  . polyvinyl alcohol (LIQUIFILM TEARS) 1.4 % ophthalmic solution Place 1 drop into both eyes as needed for dry eyes.  Marland Kitchen senna-docusate (SENOKOT-S) 8.6-50 MG tablet Take 1 tablet by mouth at bedtime as needed for mild constipation.  . sitaGLIPtin (JANUVIA) 50 MG tablet Take 1 tablet (50 mg total) by mouth daily.  Marland Kitchen tiotropium (SPIRIVA) 18 MCG inhalation capsule Place 1 capsule (18  mcg total) into inhaler and inhale daily.  . [DISCONTINUED] apixaban (ELIQUIS) 2.5 MG TABS tablet Take 1 tablet (2.5 mg total) by mouth 2 (two) times daily.  . [DISCONTINUED] meloxicam (MOBIC) 15 MG tablet meloxicam 15 mg tablet   Allergies  Allergen Reactions  . Penicillins Shortness Of Breath, Rash and Other (See Comments)    Has patient had a PCN reaction causing immediate rash, facial/tongue/throat swelling, SOB or lightheadedness with hypotension: Yes Has patient had a PCN reaction causing severe rash involving mucus membranes or skin necrosis: No Has patient had a PCN reaction that required hospitalization No Has patient had a PCN reaction  occurring within the last 10 years: No If all of the above answers are "NO", then may proceed with Cephalosporin use.  . Sulfa Antibiotics Shortness Of Breath, Rash and Other (See Comments)  . Amitiza [Lubiprostone]     N/v/d  . Aspirin Other (See Comments)    Reaction:  Unknown  Other reaction(s): Bleeding (intolerance) Per patient " causes nose to bleed" Can take 81 mg daily without any complications Other reaction(s): "bloody nose"   . Penicillin G Other (See Comments)    Has patient had a PCN reaction causing immediate rash, facial/tongue/throat swelling, SOB or lightheadedness with hypotension: No Has patient had a PCN reaction causing severe rash involving mucus membranes or skin necrosis: Unknown Has patient had a PCN reaction that required hospitalization: Unknown Has patient had a PCN reaction occurring within the last 10 years: Unknown If all of the above answers are "NO", then may proceed with Cephalosporin use.   Recent Results (from the past 2160 hour(s))  Protime-INR     Status: Abnormal   Collection Time: 07/07/19  3:48 PM  Result Value Ref Range   INR 1.2 0.8 - 1.2    Comment: Reference interval is for non-anticoagulated patients. Suggested INR therapeutic range for Vitamin K antagonist therapy:    Standard Dose (moderate intensity                   therapeutic range):       2.0 - 3.0    Higher intensity therapeutic range       2.5 - 3.5    Prothrombin Time 13.0 (H) 9.1 - 12.0 sec  POCT INR     Status: None   Collection Time: 07/11/19 12:00 AM  Result Value Ref Range   INR 2.6 2.0 - 3.0  Protime-INR     Status: Abnormal   Collection Time: 07/18/19 12:00 AM  Result Value Ref Range   INR 3.9 (A) 0.9 - 1.1  Novel Coronavirus, NAA ([facility])     Status: None   Collection Time: 07/21/19 12:00 AM   Specimen: Oropharyngeal(OP) collection in vial transport medium   OROPHARYNGEA  TESTING  Result Value Ref Range   SARS-CoV-2, NAA Not Detected Not Detected     Comment: This test was developed and its performance characteristics determined by [employer]. This test has not been FDA cleared or approved. This test has been authorized by FDA under an Emergency Use Authorization (EUA). This test is only authorized for the duration of time the declaration that circumstances exist justifying the authorization of the emergency use of in vitro diagnostic tests for detection of SARS-CoV-2 virus and/or diagnosis of COVID-19 infection under section 564(b)(1) of the Act, 21 U.S.C. 850YDX-4(J)(2), unless the authorization is terminated or revoked sooner. When diagnostic testing is negative, the possibility of a false negative result should be considered in the context  of a patient's recent exposures and the presence of clinical signs and symptoms consistent with COVID-19. An individual without symptoms of COVID-19 and who is not shedding SARS-CoV-2 virus would expect to have a negative (not detected) result in this assay.   Comprehensive metabolic panel     Status: Abnormal   Collection Time: 07/28/19  9:57 AM  Result Value Ref Range   Sodium 141 135 - 145 mmol/L   Potassium 4.1 3.5 - 5.1 mmol/L   Chloride 102 98 - 111 mmol/L   CO2 30 22 - 32 mmol/L   Glucose, Bld 105 (H) 70 - 99 mg/dL   BUN 18 8 - 23 mg/dL   Creatinine, Ser 1.23 (H) 0.44 - 1.00 mg/dL   Calcium 8.5 (L) 8.9 - 10.3 mg/dL   Total Protein 7.0 6.5 - 8.1 g/dL   Albumin 3.3 (L) 3.5 - 5.0 g/dL   AST 21 15 - 41 U/L   ALT 14 0 - 44 U/L   Alkaline Phosphatase 63 38 - 126 U/L   Total Bilirubin 0.5 0.3 - 1.2 mg/dL   GFR calc non Af Amer 38 (L) >60 mL/min   GFR calc Af Amer 44 (L) >60 mL/min   Anion gap 9 5 - 15    Comment: Performed at [facility], [address] [place], [place] [address]  CBC with Differential/Platelet     Status: Abnormal   Collection Time: 07/28/19  9:57 AM  Result Value Ref Range   WBC 6.3 4.0 - 10.5 K/uL   RBC 4.50 3.87 - 5.11 MIL/uL   Hemoglobin  13.2 12.0 - 15.0 g/dL   HCT 41.0 36.0 - 46.0 %   MCV 91.1 80.0 - 100.0 fL   MCH 29.3 26.0 - 34.0 pg   MCHC 32.2 30.0 - 36.0 g/dL   RDW 14.6 11.5 - 15.5 %   Platelets 219 150 - 400 K/uL   nRBC 0.0 0.0 - 0.2 %   Neutrophils Relative % 53 %   Neutro Abs 3.4 1.7 - 7.7 K/uL   Lymphocytes Relative 22 %   Lymphs Abs 1.4 0.7 - 4.0 K/uL   Monocytes Relative 14 %   Monocytes Absolute 0.9 0.1 - 1.0 K/uL   Eosinophils Relative 9 %   Eosinophils Absolute 0.6 (H) 0.0 - 0.5 K/uL   Basophils Relative 1 %   Basophils Absolute 0.0 0.0 - 0.1 K/uL   Immature Granulocytes 1 %   Abs Immature Granulocytes 0.03 0.00 - 0.07 K/uL    Comment: Performed at [name], [address] [place], [place] [address]  Protime-INR     Status: Abnormal   Collection Time: 07/28/19 11:55 AM  Result Value Ref Range   Prothrombin Time 35.9 (H) 11.4 - 15.2 seconds   INR 3.7 (H) 0.8 - 1.2    Comment: (NOTE) INR goal varies based on device and disease states. Performed at [facility], [address] [place], [place] [address]   Protime-INR     Status: Abnormal   Collection Time: 07/30/19  9:44 AM  Result Value Ref Range   Prothrombin Time 38.3 (H) 11.4 - 15.2 seconds   INR 4.0 (H) 0.8 - 1.2    Comment: (NOTE) INR goal varies based on device and disease states. Performed at [facility], [address] [place], [place] [address]   Protime-INR     Status: Abnormal   Collection Time: 08/01/19 10:14 AM  Result Value Ref Range   Prothrombin Time 17.1 (  H) 11.4 - 15.2 seconds   INR 1.4 (H) 0.8 - 1.2    Comment: (NOTE) INR goal varies based on device and disease states. Performed at [facility], [address] [place], [place] [address]   POCT glycosylated hemoglobin (Hb A1C)     Status: Abnormal   Collection Time: 09/30/19 12:03 PM  Result Value Ref Range   Hemoglobin A1C 6.0 (A) 4.0 - 5.6 %   HbA1c POC (<> result, manual entry) 6.0 4.0 - 5.6 %   HbA1c, POC  (prediabetic range) 6.0 5.7 - 6.4 %   HbA1c, POC (controlled diabetic range) 6.0 0.0 - 7.0 %   Objective  Body mass index is 30.92 kg/m. Wt Readings from Last 3 Encounters:  09/30/19 197 lb 6.4 oz (89.5 kg)  07/28/19 190 lb 3.2 oz (86.3 kg)  05/21/19 194 lb 3.2 oz (88.1 kg)   Temp Readings from Last 3 Encounters:  09/30/19 98.1 F (36.7 C) (Oral)  07/28/19 98.7 F (37.1 C)  07/14/19 98.4 F (36.9 C)   BP Readings from Last 3 Encounters:  09/30/19 126/76  07/28/19 126/75  05/21/19 136/76   Pulse Readings from Last 3 Encounters:  09/30/19 78  07/28/19 76  05/21/19 72    Physical Exam Vitals signs and nursing note reviewed.  Constitutional:      Appearance: Normal appearance. She is well-developed and well-groomed. She is obese.     Comments: +mask on    HENT:     Head: Normocephalic and atraumatic.  Eyes:     Conjunctiva/sclera: Conjunctivae normal.     Pupils: Pupils are equal, round, and reactive to light.  Cardiovascular:     Rate and Rhythm: Normal rate and regular rhythm.     Heart sounds: Normal heart sounds. No murmur.     Comments: 1-2+ edema b/l legs  Pulmonary:     Effort: Pulmonary effort is normal.     Breath sounds: Wheezing present.  Skin:    General: Skin is warm and dry.  Neurological:     General: No focal deficit present.     Mental Status: She is alert and oriented to person, place, and time. Mental status is at baseline.     Comments: +using rollator today    Psychiatric:        Attention and Perception: Attention and perception normal.        Mood and Affect: Mood and affect normal.        Speech: Speech normal.        Behavior: Behavior normal. Behavior is cooperative.        Thought Content: Thought content normal.        Cognition and Memory: Cognition and memory normal.        Judgment: Judgment normal.     Assessment  Plan  Type 2 diabetes mellitus without complication, without long-term current use of insulin (HCC) - Plan:  POCT glycosylated hemoglobin (Hb A1C) 6.0 today  rec healthy diet and cont januvia 50 mg qd for now  History of deep vein thrombosis (DVT) of lower extremity - Plan: apixaban (ELIQUIS) 2.5 MG TABS tablet bid  Coumadin stopped due to variable inr   Low back pain, 2/2 arthritis Plan: meloxicam (MOBIC) 15 MG tablet qd prn sparingly, rec Tylenol lidocaine patches at better alternatives being on eliquis   Hip pain 2/2 arthritis - Plan: meloxicam (MOBIC) 15 MG tablet See above   Chronic obstructive pulmonary disease, unspecified COPD type (HCC) -cont inhalers, duoneb  and flutter valve CT chest 08/13/28 bronchiectasis and scarring in RUL, PAH, small hiatal hernia, mucoid impaction post RUL seen pulm in the past rec flutter valve  HM Flugiven today 09/30/19 high dose CMA must log pna 23utd 08/29/18 prevnar had 09/20/16 Tdap 08/04/16 shingrixnot had out of stock called pharmacy,? zostavaxif had MMR immune  sawdermatologyappt 09/02/18  Out of age window pap, colonoscopy, mammo DEXA 01/01/08 osteopeniawill make sure on ca 600 bid and vit D 1000 iu qd -Vitamin d 03/15/18 48.1 -disc with patient and family at f/u if want to pursue    Provider: Dr. Olivia Mackie McLean-Scocuzza-Internal Medicine

## 2019-09-30 NOTE — Patient Instructions (Addendum)
Tylenol is safer than Meloxicam can make blood thinner in addition to Eliquis   Use Tylenol most days 500 mg no more than 3000 mg total in 1 day  Meloxicam on worst days only as needed   Lidocaine patch is safe over the counter and you can use for pain   Diabetes Mellitus and Nutrition, Adult When you have diabetes (diabetes mellitus), it is very important to have healthy eating habits because your blood sugar (glucose) levels are greatly affected by what you eat and drink. Eating healthy foods in the appropriate amounts, at about the same times every day, can help you:  Control your blood glucose.  Lower your risk of heart disease.  Improve your blood pressure.  Reach or maintain a healthy weight. Every person with diabetes is different, and each person has different needs for a meal plan. Your health care provider may recommend that you work with a diet and nutrition specialist (dietitian) to make a meal plan that is best for you. Your meal plan may vary depending on factors such as:  The calories you need.  The medicines you take.  Your weight.  Your blood glucose, blood pressure, and cholesterol levels.  Your activity level.  Other health conditions you have, such as heart or kidney disease. How do carbohydrates affect me? Carbohydrates, also called carbs, affect your blood glucose level more than any other type of food. Eating carbs naturally raises the amount of glucose in your blood. Carb counting is a method for keeping track of how many carbs you eat. Counting carbs is important to keep your blood glucose at a healthy level, especially if you use insulin or take certain oral diabetes medicines. It is important to know how many carbs you can safely have in each meal. This is different for every person. Your dietitian can help you calculate how many carbs you should have at each meal and for each snack. Foods that contain carbs include:  Bread, cereal, rice, pasta, and  crackers.  Potatoes and corn.  Peas, beans, and lentils.  Milk and yogurt.  Fruit and juice.  Desserts, such as cakes, cookies, ice cream, and candy. How does alcohol affect me? Alcohol can cause a sudden decrease in blood glucose (hypoglycemia), especially if you use insulin or take certain oral diabetes medicines. Hypoglycemia can be a life-threatening condition. Symptoms of hypoglycemia (sleepiness, dizziness, and confusion) are similar to symptoms of having too much alcohol. If your health care provider says that alcohol is safe for you, follow these guidelines:  Limit alcohol intake to no more than 1 drink per day for nonpregnant women and 2 drinks per day for men. One drink equals 12 oz of beer, 5 oz of wine, or 1 oz of hard liquor.  Do not drink on an empty stomach.  Keep yourself hydrated with water, diet soda, or unsweetened iced tea.  Keep in mind that regular soda, juice, and other mixers may contain a lot of sugar and must be counted as carbs. What are tips for following this plan?  Reading food labels  Start by checking the serving size on the "Nutrition Facts" label of packaged foods and drinks. The amount of calories, carbs, fats, and other nutrients listed on the label is based on one serving of the item. Many items contain more than one serving per package.  Check the total grams (g) of carbs in one serving. You can calculate the number of servings of carbs in one serving by dividing the  total carbs by 15. For example, if a food has 30 g of total carbs, it would be equal to 2 servings of carbs.  Check the number of grams (g) of saturated and trans fats in one serving. Choose foods that have low or no amount of these fats.  Check the number of milligrams (mg) of salt (sodium) in one serving. Most people should limit total sodium intake to less than 2,300 mg per day.  Always check the nutrition information of foods labeled as "low-fat" or "nonfat". These foods may be  higher in added sugar or refined carbs and should be avoided.  Talk to your dietitian to identify your daily goals for nutrients listed on the label. Shopping  Avoid buying canned, premade, or processed foods. These foods tend to be high in fat, sodium, and added sugar.  Shop around the outside edge of the grocery store. This includes fresh fruits and vegetables, bulk grains, fresh meats, and fresh dairy. Cooking  Use low-heat cooking methods, such as baking, instead of high-heat cooking methods like deep frying.  Cook using healthy oils, such as olive, canola, or sunflower oil.  Avoid cooking with butter, cream, or high-fat meats. Meal planning  Eat meals and snacks regularly, preferably at the same times every day. Avoid going long periods of time without eating.  Eat foods high in fiber, such as fresh fruits, vegetables, beans, and whole grains. Talk to your dietitian about how many servings of carbs you can eat at each meal.  Eat 4-6 ounces (oz) of lean protein each day, such as lean meat, chicken, fish, eggs, or tofu. One oz of lean protein is equal to: ? 1 oz of meat, chicken, or fish. ? 1 egg. ?  cup of tofu.  Eat some foods each day that contain healthy fats, such as avocado, nuts, seeds, and fish. Lifestyle  Check your blood glucose regularly.  Exercise regularly as told by your health care provider. This may include: ? 150 minutes of moderate-intensity or vigorous-intensity exercise each week. This could be brisk walking, biking, or water aerobics. ? Stretching and doing strength exercises, such as yoga or weightlifting, at least 2 times a week.  Take medicines as told by your health care provider.  Do not use any products that contain nicotine or tobacco, such as cigarettes and e-cigarettes. If you need help quitting, ask your health care provider.  Work with a Social worker or diabetes educator to identify strategies to manage stress and any emotional and social  challenges. Questions to ask a health care provider  Do I need to meet with a diabetes educator?  Do I need to meet with a dietitian?  What number can I call if I have questions?  When are the best times to check my blood glucose? Where to find more information:  American Diabetes Association: diabetes.org  Academy of Nutrition and Dietetics: www.eatright.[employer] of Diabetes and Digestive and Kidney Diseases (NIH): [employer] Summary  A healthy meal plan will help you control your blood glucose and maintain a healthy lifestyle.  Working with a diet and nutrition specialist (dietitian) can help you make a meal plan that is best for you.  Keep in mind that carbohydrates (carbs) and alcohol have immediate effects on your blood glucose levels. It is important to count carbs and to use alcohol carefully. This information is not intended to replace advice given to you by your health care provider. Make sure you discuss any questions you have with your  health care provider. Document Released: 08/17/2005 Document Revised: 11/02/2017 Document Reviewed: 12/25/2016 Elsevier Patient Education  2020 [employer].

## 2019-10-10 ENCOUNTER — Other Ambulatory Visit: Payer: Self-pay | Admitting: Internal Medicine

## 2019-10-10 DIAGNOSIS — J309 Allergic rhinitis, unspecified: Secondary | ICD-10-CM

## 2019-10-10 MED ORDER — CETIRIZINE HCL 10 MG PO TABS: 10.0000 mg | ORAL_TABLET | Freq: Every day | ORAL | 3 refills | Status: DC

## 2019-10-13 ENCOUNTER — Other Ambulatory Visit: Payer: Self-pay

## 2019-10-13 ENCOUNTER — Encounter: Payer: Self-pay | Admitting: Podiatry

## 2019-10-13 ENCOUNTER — Ambulatory Visit (INDEPENDENT_AMBULATORY_CARE_PROVIDER_SITE_OTHER): Payer: Medicare Other | Admitting: Podiatry

## 2019-10-13 DIAGNOSIS — M79676 Pain in unspecified toe(s): Secondary | ICD-10-CM

## 2019-10-13 DIAGNOSIS — E1140 Type 2 diabetes mellitus with diabetic neuropathy, unspecified: Secondary | ICD-10-CM | POA: Diagnosis not present

## 2019-10-13 DIAGNOSIS — L84 Corns and callosities: Secondary | ICD-10-CM | POA: Diagnosis not present

## 2019-10-13 DIAGNOSIS — B351 Tinea unguium: Secondary | ICD-10-CM

## 2019-10-13 DIAGNOSIS — D689 Coagulation defect, unspecified: Secondary | ICD-10-CM | POA: Diagnosis not present

## 2019-10-13 NOTE — Progress Notes (Signed)
Complaint:  Visit Type: Patient returns to my office for continued preventative foot care services. Complaint: Patient states" my nails have grown long and thick and become painful to walk and wear shoes" Patient has been diagnosed with DM with neuropathy. The patient presents for preventative foot care services. No changes to ROS.  Patient is taking coumadin. Patient has painful callus right foot.  Podiatric Exam: Vascular: dorsalis pedis and posterior tibial pulses are palpable bilateral. Capillary return is immediate. Temperature gradient is WNL. Skin turgor WNL  Sensorium: Diminished Semmes Weinstein monofilament test. Normal tactile sensation bilaterally. Nail Exam: Pt has thick disfigured discolored nails with subungual debris noted bilateral entire nail hallux through fifth toenails Ulcer Exam: There is no evidence of ulcer or pre-ulcerative changes or infection. Orthopedic Exam: Muscle tone and strength are WNL. No limitations in general ROM. No crepitus or effusions noted. Foot type and digits show no abnormalities. HAV  B/L. Skin: No Porokeratosis. No infection or ulcers.  Callus sub 1st MPJ right foot.  Diagnosis:  Onychomycosis, , Pain in right toe, pain in left toes Debride callus.  Treatment & Plan Procedures and Treatment: Consent by patient was obtained for treatment procedures.   Debridement of mycotic and hypertrophic toenails, 1 through 5 bilateral and clearing of subungual debris. No ulceration, no infection noted.  Return Visit-Office Procedure: Patient instructed to return to the office for a follow up visit 3 months for continued evaluation and treatment.    Gardiner Barefoot DPM

## 2019-10-28 ENCOUNTER — Other Ambulatory Visit: Payer: Self-pay | Admitting: Internal Medicine

## 2019-10-28 DIAGNOSIS — E119 Type 2 diabetes mellitus without complications: Secondary | ICD-10-CM

## 2019-10-28 MED ORDER — SITAGLIPTIN PHOSPHATE 50 MG PO TABS: 50.0000 mg | ORAL_TABLET | Freq: Every day | ORAL | 3 refills | Status: DC

## 2019-11-05 IMAGING — CT CT HEAD W/O CM
3 series · 15 of 46 positions shown, 18 images · non-contrast
Comparison: 08/28/2009

CLINICAL DATA: Weakness and orthostatic hypotension

EXAM:
CT HEAD WITHOUT CONTRAST
TECHNIQUE: Contiguous axial images were obtained from the base of the skull
through the vertex without intravenous contrast.

[Series 2: head wo · axial · 0.41mm/px · z∈[+228,+348]mm · 9 of 29 slices shown, 12 images]
[im 3/29  brain]
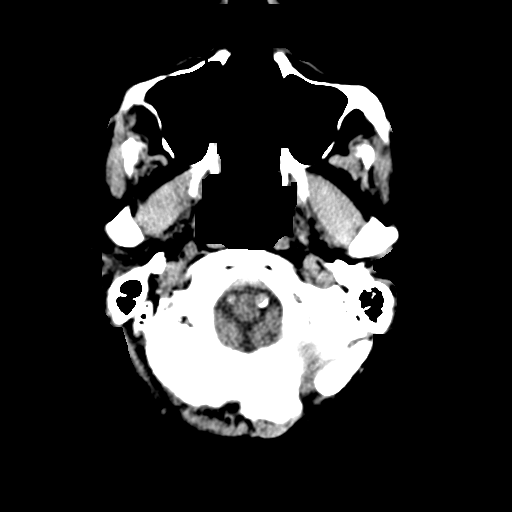
[im 3/29  bone]
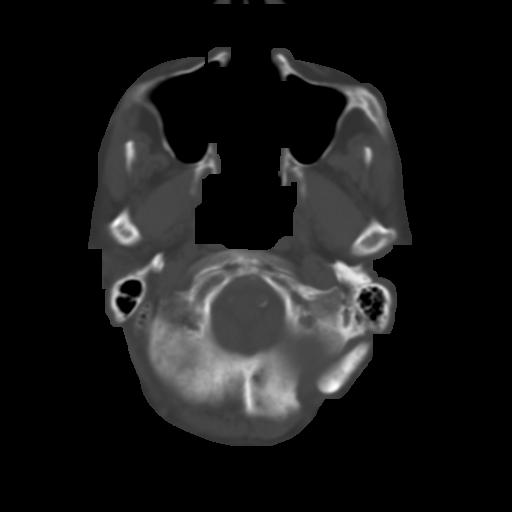
[im 6/29  brain]
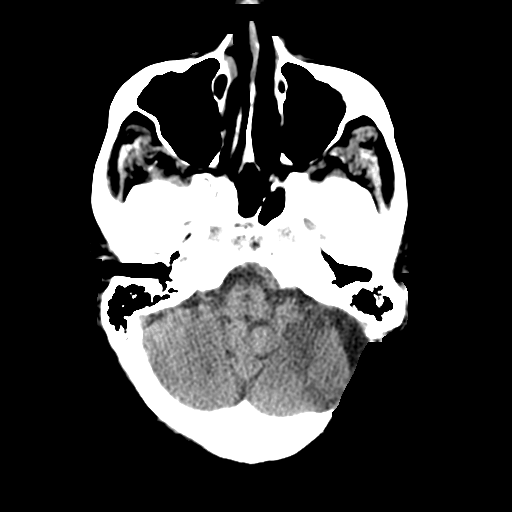
[im 9/29  brain]
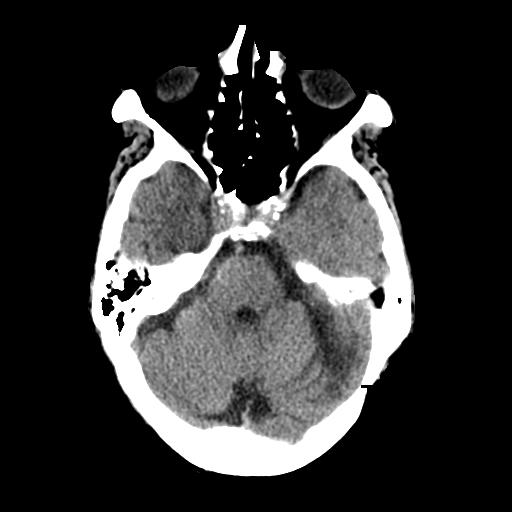
[im 12/29  brain]
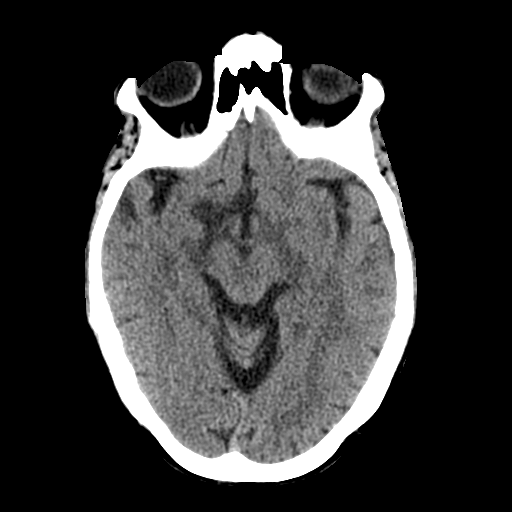
[im 15/29  brain]
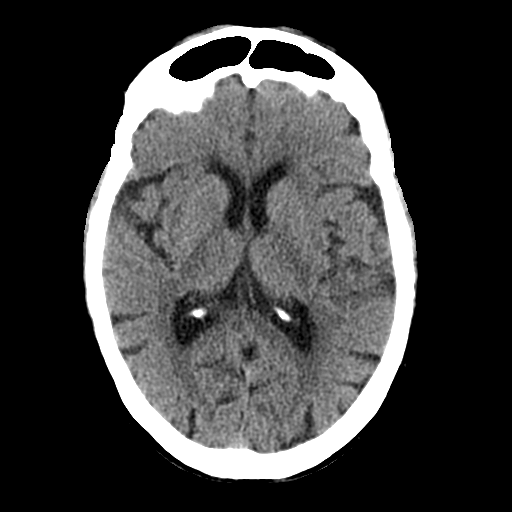
[im 15/29  bone]
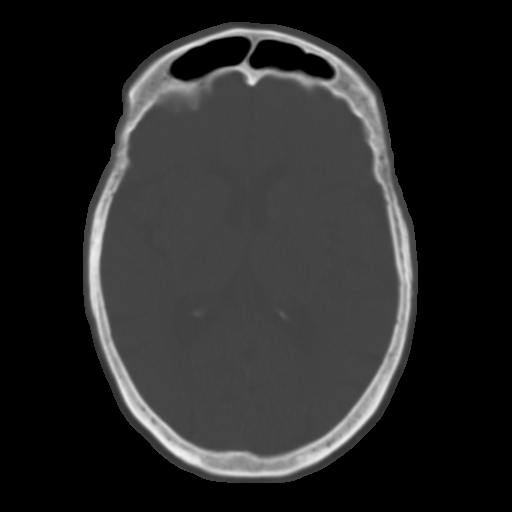
[im 18/29  brain]
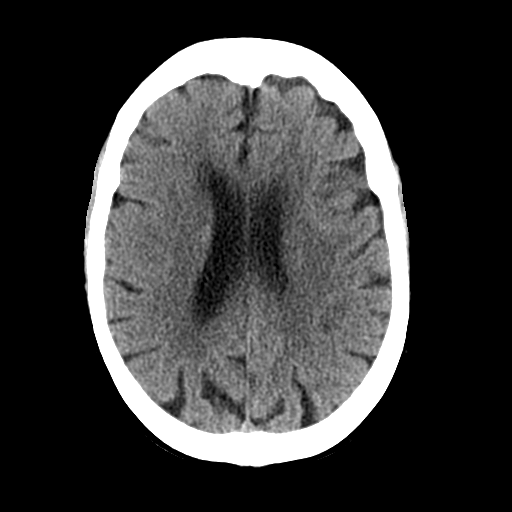
[im 21/29  brain]
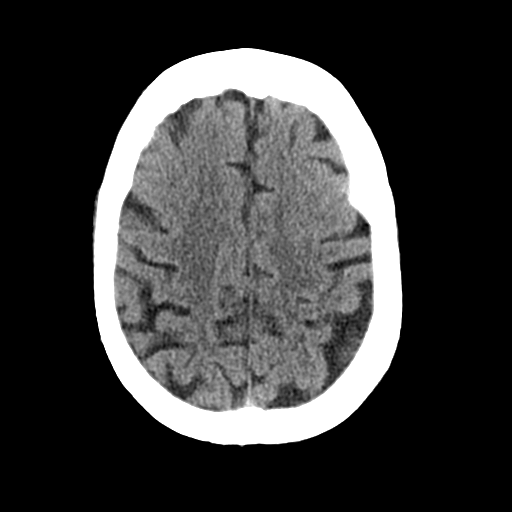
[im 24/29  brain]
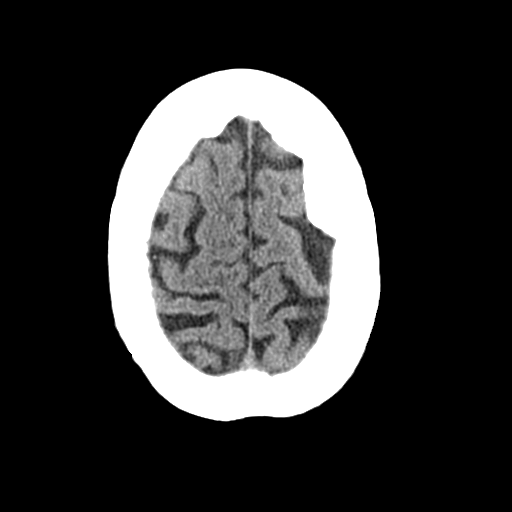
[im 27/29  brain]
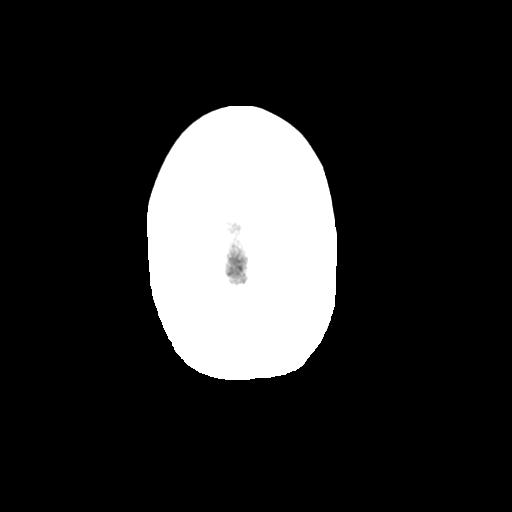
[im 27/29  bone]
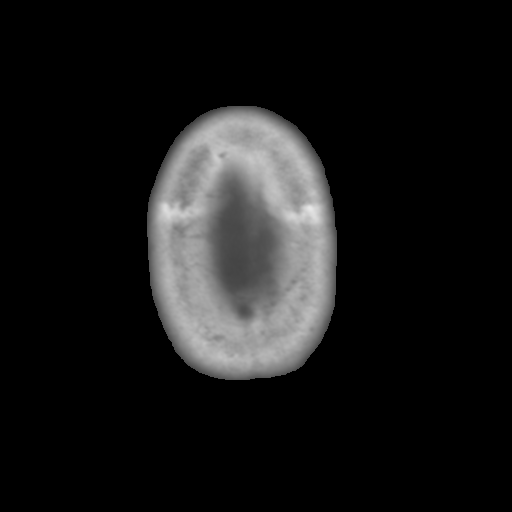

[Series 4: coronal soft tissue · coronal · 0.30mm/px · 3 of 68 slices shown]
[im 23/68  brain]
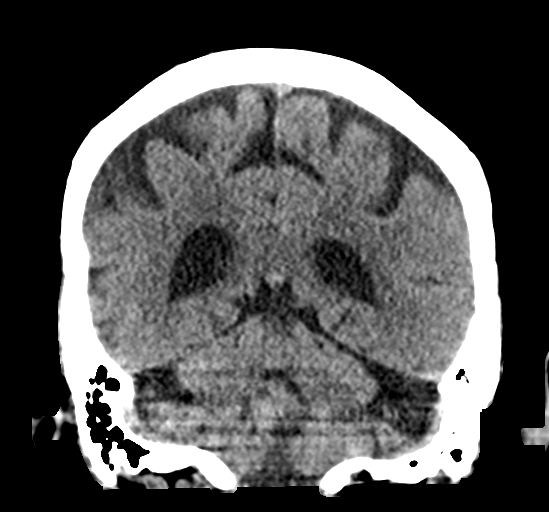
[im 30/68  brain]
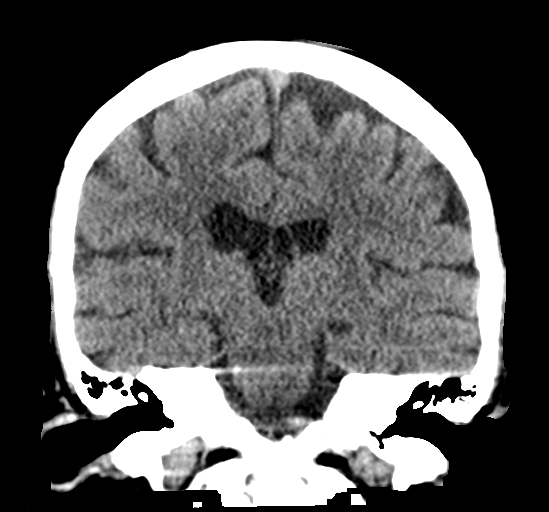
[im 38/68  brain]
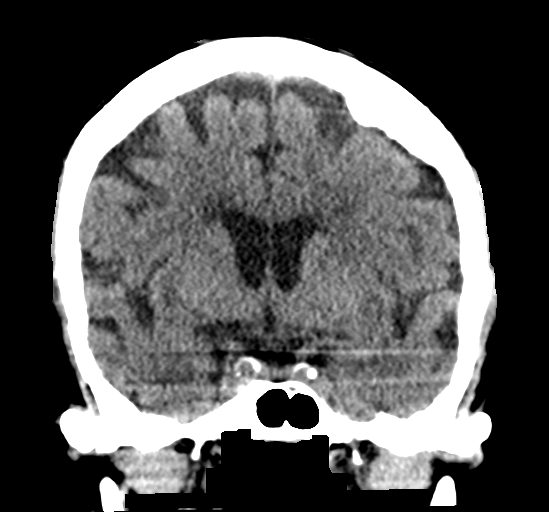

[Series 5: sagittal soft tissue · sagittal · 0.30mm/px · 3 of 49 slices shown]
[im 17/49  brain]
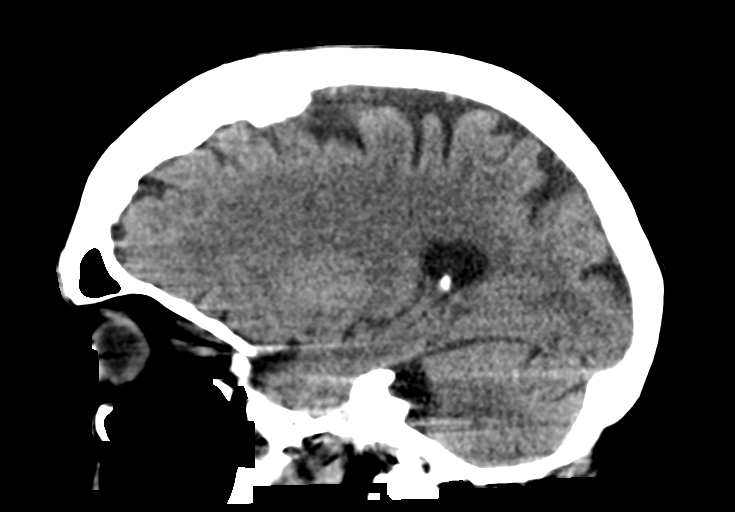
[im 25/49  brain]
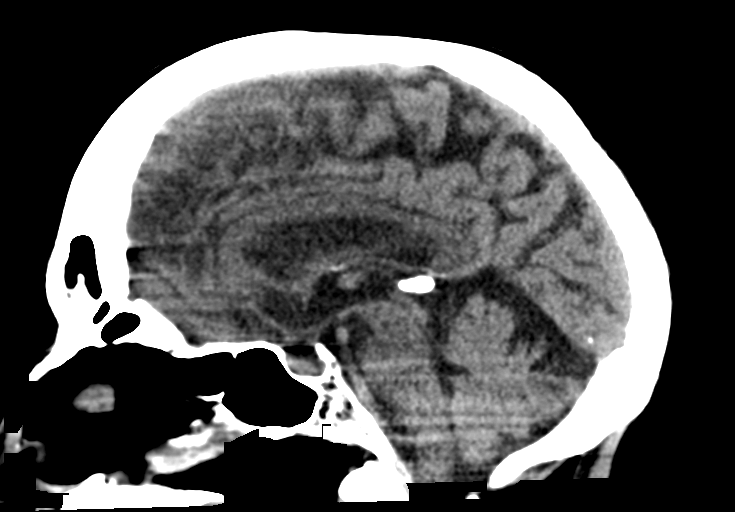
[im 33/49  brain]
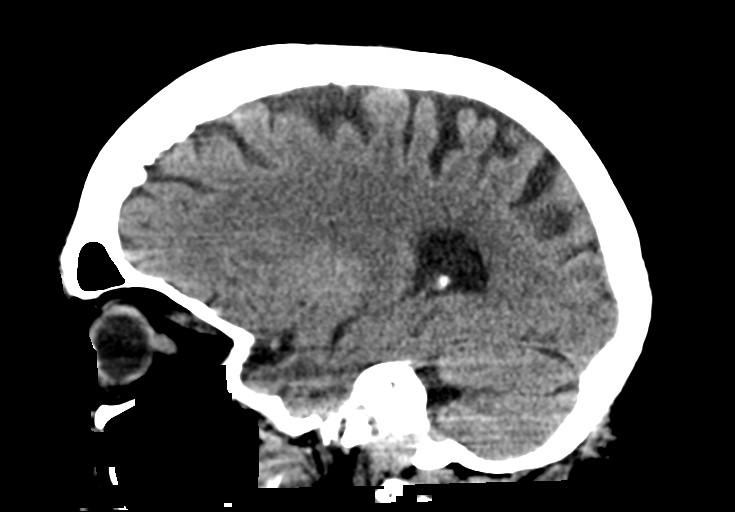

[15 of 46 positions shown; findings below may reference images not displayed]

FINDINGS: Brain: No evidence of acute infarction, hemorrhage, hydrocephalus,
extra-axial collection or mass lesion/mass effect.

Vascular: No hyperdense vessel or unexpected calcification.

Skull: Postsurgical defect is noted in the left occipital region

Sinuses/Orbits: Within normal limits.

Other: None
IMPRESSION: Postsurgical changes stable from the prior exam. No acute
intracranial abnormality noted.

## 2019-11-06 ENCOUNTER — Other Ambulatory Visit: Payer: Self-pay

## 2019-11-07 ENCOUNTER — Inpatient Hospital Stay: Payer: Medicare Other | Attending: Oncology

## 2019-11-07 ENCOUNTER — Other Ambulatory Visit: Payer: Self-pay

## 2019-11-07 DIAGNOSIS — Z95828 Presence of other vascular implants and grafts: Secondary | ICD-10-CM

## 2019-11-07 DIAGNOSIS — Z86718 Personal history of other venous thrombosis and embolism: Secondary | ICD-10-CM | POA: Insufficient documentation

## 2019-11-07 LAB — CBC WITH DIFFERENTIAL/PLATELET
Abs Immature Granulocytes: 0.04 10*3/uL (ref 0.00–0.07)
Basophils Absolute: 0 10*3/uL (ref 0.0–0.1)
Basophils Relative: 1 %
Eosinophils Absolute: 0.3 10*3/uL (ref 0.0–0.5)
Eosinophils Relative: 5 %
HCT: 43.4 % (ref 36.0–46.0)
Hemoglobin: 13.5 g/dL (ref 12.0–15.0)
Immature Granulocytes: 1 %
Lymphocytes Relative: 26 %
Lymphs Abs: 1.5 10*3/uL (ref 0.7–4.0)
MCH: 29.7 pg (ref 26.0–34.0)
MCHC: 31.1 g/dL (ref 30.0–36.0)
MCV: 95.6 fL (ref 80.0–100.0)
Monocytes Absolute: 0.5 10*3/uL (ref 0.1–1.0)
Monocytes Relative: 9 %
Neutro Abs: 3.5 10*3/uL (ref 1.7–7.7)
Neutrophils Relative %: 58 %
Platelets: 168 10*3/uL (ref 150–400)
RBC: 4.54 MIL/uL (ref 3.87–5.11)
RDW: 14.6 % (ref 11.5–15.5)
WBC: 5.8 10*3/uL (ref 4.0–10.5)
nRBC: 0 % (ref 0.0–0.2)

## 2019-11-07 LAB — COMPREHENSIVE METABOLIC PANEL
ALT: 15 U/L (ref 0–44)
AST: 21 U/L (ref 15–41)
Albumin: 3.6 g/dL (ref 3.5–5.0)
Alkaline Phosphatase: 53 U/L (ref 38–126)
Anion gap: 8 (ref 5–15)
BUN: 20 mg/dL (ref 8–23)
CO2: 31 mmol/L (ref 22–32)
Calcium: 8.8 mg/dL — ABNORMAL LOW (ref 8.9–10.3)
Chloride: 105 mmol/L (ref 98–111)
Creatinine, Ser: 1.16 mg/dL — ABNORMAL HIGH (ref 0.44–1.00)
GFR calc Af Amer: 48 mL/min — ABNORMAL LOW (ref >60)
GFR calc non Af Amer: 41 mL/min — ABNORMAL LOW (ref >60)
Glucose, Bld: 130 mg/dL — ABNORMAL HIGH (ref 70–99)
Potassium: 3.9 mmol/L (ref 3.5–5.1)
Sodium: 144 mmol/L (ref 135–145)
Total Bilirubin: 0.5 mg/dL (ref 0.3–1.2)
Total Protein: 6.8 g/dL (ref 6.5–8.1)

## 2019-11-07 NOTE — Telephone Encounter (Signed)
Phone call message PT INR for today 0.9.  Oceanna Arruda,cma

## 2019-11-10 ENCOUNTER — Other Ambulatory Visit: Payer: Medicare Other

## 2019-11-10 ENCOUNTER — Ambulatory Visit: Payer: Medicare Other | Admitting: Oncology

## 2019-11-10 ENCOUNTER — Encounter: Payer: Self-pay | Admitting: Oncology

## 2019-11-10 ENCOUNTER — Other Ambulatory Visit: Payer: Self-pay

## 2019-11-10 ENCOUNTER — Inpatient Hospital Stay (HOSPITAL_BASED_OUTPATIENT_CLINIC_OR_DEPARTMENT_OTHER): Payer: Medicare Other | Admitting: Oncology

## 2019-11-10 ENCOUNTER — Ambulatory Visit
Admission: RE | Admit: 2019-11-10 | Discharge: 2019-11-10 | Disposition: A | Discharge status: Home / Self Care | Payer: Medicare Other | Source: Ambulatory Visit | Attending: Oncology | Admitting: Oncology

## 2019-11-10 DIAGNOSIS — Z7901 Long term (current) use of anticoagulants: Secondary | ICD-10-CM | POA: Diagnosis not present

## 2019-11-10 DIAGNOSIS — Z86718 Personal history of other venous thrombosis and embolism: Secondary | ICD-10-CM | POA: Insufficient documentation

## 2019-11-10 DIAGNOSIS — R600 Localized edema: Secondary | ICD-10-CM

## 2019-11-10 DIAGNOSIS — Z95828 Presence of other vascular implants and grafts: Secondary | ICD-10-CM

## 2019-11-10 NOTE — Progress Notes (Signed)
HEMATOLOGY-ONCOLOGY TeleHEALTH VISIT PROGRESS NOTE  I connected with Dierdre Forth on 11/10/19 at 10:45 AM EST by video enabled telemedicine visit and verified that I am speaking with the correct person using two identifiers. I discussed the limitations, risks, security and privacy concerns of performing an evaluation and management service by telemedicine and the availability of in-person appointments. I also discussed with the patient that there may be a patient responsible charge related to this service. The patient expressed understanding and agreed to proceed.   Other persons participating in the visit and their role in the encounter:  Caregiver, Stanton Kidney  Patient's location: Home  Provider's location: office Chief Complaint: Follow-up for chronic anticoagulation/history of DVT   INTERVAL HISTORY Jessica Erickson is a 85 y.o. female who has above history reviewed by me today presents for follow up visit for management of chronic anticoagulation/history of DVT Problems and complaints are listed below:  Patient/caregiver Stanton Kidney had no particular concerns today.  Patient has been on Eliquis 2.5 mg twice daily.  Tolerates well.  No reports of acute bleeding events.  Occasionally some bruising.  Review of Systems  Unable to perform ROS: Age  Constitutional: Negative for fever.  HENT:   Positive for hearing loss.   Respiratory: Negative for cough and shortness of breath.   Cardiovascular: Negative for chest pain.  Gastrointestinal: Negative for abdominal pain.  Neurological: Negative for speech difficulty.  Hematological: Bruises/bleeds easily.  Psychiatric/Behavioral: Negative for confusion.    Past Medical History:  Diagnosis Date  . Acoustic neuroma ([place])   . Allergy   . Asthma   . Bilateral swelling of feet    and legs  . Bladder infection   . CAD (coronary artery disease)   . Cataract   . Change in voice   . Compression fracture of body of thoracic vertebra (HCC)    T12 09/18/15  MRI s/p fall   . Constipation   . COPD (chronic obstructive pulmonary disease) ([place])    previous CXR with chronic interstitial lung dz   . CVA (cerebral vascular accident) ([place])   . Depression   . Diabetes ([place])    with neuropathy  . Diabetes mellitus, type 2 ([place])   . Diarrhea   . Double vision   . DVT (deep venous thrombosis) ([place])    right leg 10/2015 was on coumadin off as of 2017/2018 ; s/p IVC filter  . Enuresis   . Eye pain, right   . Fall   . Fatty liver    09/15/15 also mildly dilated pancreatitic duct rec MRCP small sub cm cyst hemangioma speeln mild right hydronephrorossi and prox. hydroureter, kidney stones, mild scarring kidneys  . Female stress incontinence   . Flank pain   . GERD (gastroesophageal reflux disease)    with small hiatal hernia   . Hard of hearing   . Heart disease   . History of kidney problems   . Hyperlipidemia    mixed  . Hypertension   . Hypothyroidism, postsurgical   . Impaired mobility and ADLs    uses rolling walker has caretaker 24/7 at home  . Leg edema   . Mixed incontinence urge and stress (female)(female)   . Neuropathy   . Osteoarthritis    DDD spine   . Osteoporosis with fracture    T12 compression fracture  . Photophobia   . Pulmonary embolism ([place])    10/2015 off coumadin as of 04/2016  . Pulmonary HTN (HCC)    mild pulm HTN,  echo 10/09/15 EF 37-10%GYIRS 1 dd, RV systolic pressure increased   . Recurrent UTI   . Sinus pressure   . Skin cancer    BCC jawline and scalp   . Thyroid disease    follows [facility]  . TIA (transient ischemic attack)    MRI 2009/2010 neg stroke   . Trigeminal neuralgia    Dr. Tomi Bamberger s/p gamma knife x 2, on Tegretol since 2011/2012 no increase in dose >200 mg bid rec per family per neurology   . Urinary frequency   . Urinary, incontinence, stress female    Dr Erlene Quan urology    Past Surgical History:  Procedure Laterality Date  . APPENDECTOMY     as a child, open  . BRAIN SURGERY      schwnnoma removal 1996   . brain tumor surgery    . BREAST SURGERY     breast bx  . CATARACT EXTRACTION    . CHOLECYSTECTOMY    . EYE SURGERY     cataract  . IVC FILTER PLACEMENT ([facility] HX)     Dr. Lucky Cowboy 10/2015   . LAPAROSCOPIC TUBAL LIGATION    . MOHS SURGERY     scalp 04/2014   . PERIPHERAL VASCULAR CATHETERIZATION N/A 10/11/2015   Procedure: IVC Filter Insertion;  Surgeon: Algernon Huxley, MD;  Location: [facility];  Service: Cardiovascular;  Laterality: N/A;  . PERIPHERAL VASCULAR THROMBECTOMY Bilateral 03/29/2018   Procedure: PERIPHERAL VASCULAR THROMBECTOMY;  Surgeon: Algernon Huxley, MD;  Location: [facility];  Service: Cardiovascular;  Laterality: Bilateral;  . PUBOVAGINAL SLING    . THROAT SURGERY    . THYROID SURGERY     tumor around vocal cords   . TOOTH EXTRACTION     winter 2018   . TOTAL THYROIDECTOMY  1976    Family History  Problem Relation Age of Onset  . Heart disease Mother   . Diabetes Father   . Cancer Daughter        breast ca x 2 s/p mastectomy     Social History   Socioeconomic History  . Marital status: Widowed    Spouse name: Not on file  . Number of children: 5  . Years of education: Not on file  . Highest education level: Not on file  Occupational History  . Occupation: retired  Scientific laboratory technician  . Financial resource strain: Not hard at all  . Food insecurity    Worry: Never true    Inability: Never true  . Transportation needs    Medical: No    Non-medical: No  Tobacco Use  . Smoking status: Former Smoker    Packs/day: 0.50    Years: 20.00    Pack years: 10.00    Types: Cigarettes    Quit date: 09/20/1995    Years since quitting: 24.1  . Smokeless tobacco: Never Used  . Tobacco comment: quit 1996 smoked 20 years max 8 cig qd   Substance and Sexual Activity  . Alcohol use: No  . Drug use: No  . Sexual activity: Not on file  Lifestyle  . Physical activity    Days per week: 4 days    Minutes per session: 40 min  .  Stress: Not at all  Relationships  . Social Herbalist on phone: Not on file    Gets together: Not on file    Attends religious service: Not on file    Active member of club  or organization: Not on file    Attends meetings of clubs or organizations: Not on file    Relationship status: Not on file  . Intimate partner violence    Fear of current or ex partner: Not on file    Emotionally abused: Not on file    Physically abused: Not on file    Forced sexual activity: Not on file  Other Topics Concern  . Not on file  Social History Narrative   Lives at home with caretaker currently Sharee Pimple and [place]           Current Outpatient Medications on File Prior to Visit  Medication Sig Dispense Refill  . acetaminophen (TYLENOL) 325 MG tablet Take 2 tablets (650 mg total) by mouth every 6 (six) hours as needed. 120 tablet 11  . acetaminophen-codeine (TYLENOL #3) 300-30 MG tablet Take 1 tablet by mouth 2 (two) times daily as needed for moderate pain. 10 tablet 0  . albuterol (PROVENTIL) (2.5 MG/3ML) 0.083% nebulizer solution Take 2.5 mg by nebulization 3 (three) times daily.    . AMBULATORY NON FORMULARY MEDICATION Medication Name: Please dispense flutter valve to use three times daily after nebulization. DX:J47.9 1 each 0  . apixaban (ELIQUIS) 2.5 MG TABS tablet Take 1 tablet (2.5 mg total) by mouth 2 (two) times daily. 180 tablet 3  . atorvastatin (LIPITOR) 40 MG tablet Take 1 tablet (40 mg total) by mouth daily at 6 PM. Generic ok 90 tablet 3  . bisacodyl (DULCOLAX) 5 MG EC tablet Take 1 tablet (5 mg total) by mouth daily as needed for moderate constipation. 30 tablet 5  . budesonide-formoterol (SYMBICORT) 160-4.5 MCG/ACT inhaler Inhale 2 puffs into the lungs 2 (two) times daily. Rinse mouth 1 Inhaler 12  . Calcium Carbonate-Vitamin D (CALCIUM 600+D) 600-400 MG-UNIT tablet Take 1 tablet by mouth 2 (two) times daily. Lunch and dinner 180 tablet 3  . cetirizine (ZYRTEC) 10 MG tablet Take 1  tablet (10 mg total) by mouth daily. 90 tablet 3  . DOK 100 MG capsule TAKE 1 CAPSULE BY MOUTH TWICE DAILY 180 capsule 3  . fluticasone (FLONASE) 50 MCG/ACT nasal spray Place 1-2 sprays into both nostrils daily. Max 2 sprays 16 g 11  . furosemide (LASIX) 20 MG tablet Take 1 tablet (20 mg total) by mouth daily. May take 2nd dose 20 mg if needed at lunch. Not reduced dose to use 1-2 x per day 180 tablet 3  . gabapentin (NEURONTIN) 100 MG capsule Take 1 capsule (100 mg total) by mouth 2 (two) times daily. 180 capsule 3  . guaiFENesin (MUCINEX) 600 MG 12 hr tablet Take 600 mg by mouth daily.     Marland Kitchen ipratropium-albuterol (DUONEB) 0.5-2.5 (3) MG/3ML SOLN Inhale 3 mLs into the lungs every 4 (four) hours as needed. 360 mL 12  . levothyroxine (SYNTHROID) 200 MCG tablet Take 1 tablet (200 mcg total) by mouth daily before breakfast. Except on Sunday. Do not take with other medications or vitamins 90 tablet 3  . Melatonin 10 MG TABS Take by mouth.     . meloxicam (MOBIC) 15 MG tablet Take 1 tablet (15 mg total) by mouth daily as needed for pain. 30 tablet 11  . mirtazapine (REMERON) 15 MG tablet Take 1 tablet (15 mg total) by mouth at bedtime. 90 tablet 3  . Multiple Vitamin (MULTIVITAMIN WITH MINERALS) TABS tablet Take 1 tablet by mouth daily.    . ondansetron (ZOFRAN) 4 MG tablet Take 1 tablet (4 mg total)  by mouth every 8 (eight) hours as needed for nausea or vomiting. 30 tablet 1  . pantoprazole (PROTONIX) 40 MG tablet Take 1 tablet (40 mg total) by mouth daily. 30 minutes before lunch or dinner 90 tablet 3  . polyethylene glycol powder (GLYCOLAX/MIRALAX) powder Take 17 g by mouth daily. 255 g 11  . polyvinyl alcohol (LIQUIFILM TEARS) 1.4 % ophthalmic solution Place 1 drop into both eyes as needed for dry eyes. 15 mL 11  . senna-docusate (SENOKOT-S) 8.6-50 MG tablet Take 1 tablet by mouth at bedtime as needed for mild constipation.    . sitaGLIPtin (JANUVIA) 50 MG tablet Take 1 tablet (50 mg total) by mouth  daily. 90 tablet 3  . tiotropium (SPIRIVA) 18 MCG inhalation capsule Place 1 capsule (18 mcg total) into inhaler and inhale daily. 90 capsule 3   No current facility-administered medications on file prior to visit.     Allergies  Allergen Reactions  . Penicillins Shortness Of Breath, Rash and Other (See Comments)    Has patient had a PCN reaction causing immediate rash, facial/tongue/throat swelling, SOB or lightheadedness with hypotension: Yes Has patient had a PCN reaction causing severe rash involving mucus membranes or skin necrosis: No Has patient had a PCN reaction that required hospitalization No Has patient had a PCN reaction occurring within the last 10 years: No If all of the above answers are "NO", then may proceed with Cephalosporin use.  . Sulfa Antibiotics Shortness Of Breath, Rash and Other (See Comments)  . Amitiza [Lubiprostone]     N/v/d  . Aspirin Other (See Comments)    Reaction:  Unknown  Other reaction(s): Bleeding (intolerance) Per patient " causes nose to bleed" Can take 81 mg daily without any complications Other reaction(s): "bloody nose"   . Penicillin G Other (See Comments)    Has patient had a PCN reaction causing immediate rash, facial/tongue/throat swelling, SOB or lightheadedness with hypotension: No Has patient had a PCN reaction causing severe rash involving mucus membranes or skin necrosis: Unknown Has patient had a PCN reaction that required hospitalization: Unknown Has patient had a PCN reaction occurring within the last 10 years: Unknown If all of the above answers are "NO", then may proceed with Cephalosporin use.       Observations/Objective: Today's Vitals   11/10/19 1025  PainSc: 0-No pain   There is no height or weight on file to calculate BMI.  Physical Exam  Constitutional: No distress.  Neurological: She is alert.  Psychiatric: Mood normal.  I asked Stanton Kidney to show patient's bilateral lower extremity using bone camera .  It appears  to me that patient has chronic lower extremity edema.  CBC    Component Value Date/Time   WBC 5.8 11/07/2019 1250   RBC 4.54 11/07/2019 1250   HGB 13.5 11/07/2019 1250   HCT 43.4 11/07/2019 1250   PLT 168 11/07/2019 1250   MCV 95.6 11/07/2019 1250   MCH 29.7 11/07/2019 1250   MCHC 31.1 11/07/2019 1250   RDW 14.6 11/07/2019 1250   LYMPHSABS 1.5 11/07/2019 1250   MONOABS 0.5 11/07/2019 1250   EOSABS 0.3 11/07/2019 1250   BASOSABS 0.0 11/07/2019 1250    CMP     Component Value Date/Time   NA 144 11/07/2019 1250   K 3.9 11/07/2019 1250   CL 105 11/07/2019 1250   CO2 31 11/07/2019 1250   GLUCOSE 130 (H) 11/07/2019 1250   BUN 20 11/07/2019 1250   CREATININE 1.16 (H) 11/07/2019 1250  CALCIUM 8.8 (L) 11/07/2019 1250   PROT 6.8 11/07/2019 1250   ALBUMIN 3.6 11/07/2019 1250   AST 21 11/07/2019 1250   ALT 15 11/07/2019 1250   ALKPHOS 53 11/07/2019 1250   BILITOT 0.5 11/07/2019 1250   GFRNONAA 41 (L) 11/07/2019 1250   GFRAA 48 (L) 11/07/2019 1250     Assessment and Plan: 1. History of deep vein thrombosis (DVT) of lower extremity   2. Presence of IVC filter   3. Chronic anticoagulation   4. Bilateral lower extremity edema     Labs are reviewed and discussed with patient and caregiver. Patient has stable hemoglobin.  Kidney function is also stable.  Continue anticoagulation. Patient was previously on Coumadin, switched to maintenance dose of Eliquis 2.5 mg twice daily.  She tolerates well. Bilateral lower extremity edema seems to be more evident comparing to last visit. I called patient's daughter who informed me that patient's son also had mentioned that lower extremity edema is worse recently. Patient has a history of IVC filter.  Discussed with daughter that patient may have chronic lower extremity vein insufficiency which contributes to his edema.  Given that her anticoagulation was recently changed to Eliquis 2.5 mg twice daily, I would recommend to proceed with ultrasound  venous duplex bilateral lower extremity to rule out DVT.  Venous duplex ultrasound was reviewed.  No DVT.  Limited evaluation of deep calf veins. Recommend patient to continue Eliquis 2.5 mg twice daily.  Revised use of compression stocking.  Ultrasound results and recommendation will be medicated with patient's caregiver Stanton Kidney per daughter's request.   Follow Up Instructions: Follow-up in 4 months. Orders Placed This Encounter  Procedures  . US Venous Img Lower Bilateral    Standing Status:   Future    Number of Occurrences:   1    Standing Expiration Date:   11/09/2020    Order Specific Question:   Reason for Exam (SYMPTOM  OR DIAGNOSIS REQUIRED)    Answer:   lower extremity edema worsened    Order Specific Question:   Preferred imaging location?    Answer:   [facility]    Order Specific Question:   Call Results- Best Contact Number?    Answer:   [phone]  . CBC with Differential    Standing Status:   Future    Standing Expiration Date:   11/09/2020  . Comprehensive metabolic panel    Standing Status:   Future    Standing Expiration Date:   11/09/2020     I discussed the assessment and treatment plan with the patient. The patient was provided an opportunity to ask questions and all were answered. The patient agreed with the plan and demonstrated an understanding of the instructions.  The patient was advised to call back or seek an in-person evaluation if the symptoms worsen or if the condition fails to improve as anticipated.    Earlie Server, MD 11/10/2019 9:04 PM

## 2019-11-10 NOTE — Progress Notes (Signed)
Patient verified using two identifiers for virtual visit via telephone today.  Dr. Tasia Catchings will call caregiver's [facility]) phone to connect for virtual visit.

## 2019-11-11 ENCOUNTER — Telehealth: Payer: Self-pay

## 2019-11-11 NOTE — Telephone Encounter (Signed)
Jessica Erickson informed.

## 2019-11-11 NOTE — Telephone Encounter (Signed)
-----   Message from Earlie Server, MD sent at 11/10/2019  9:34 PM EST ----- Please let patient know that ultrasound lower extremity did not show acute DVT.  Although the study was limited due to edema.  Recommend patient to continue Eliquis 2.5 mg twice daily.  Follow-up as planned.  Advised patient to use compression stocking.  Please update patient's caregiver Stanton Kidney.

## 2019-11-14 ENCOUNTER — Ambulatory Visit (INDEPENDENT_AMBULATORY_CARE_PROVIDER_SITE_OTHER): Payer: Medicare Other | Admitting: Vascular Surgery

## 2019-12-04 ENCOUNTER — Telehealth: Payer: Self-pay | Admitting: Internal Medicine

## 2019-12-04 ENCOUNTER — Other Ambulatory Visit: Payer: Self-pay | Admitting: Internal Medicine

## 2019-12-04 DIAGNOSIS — G500 Trigeminal neuralgia: Secondary | ICD-10-CM

## 2019-12-04 MED ORDER — GABAPENTIN 100 MG PO CAPS: 200.0000 mg | ORAL_CAPSULE | Freq: Two times a day (BID) | ORAL | 3 refills | Status: DC

## 2019-12-04 MED ORDER — ACETAMINOPHEN-CODEINE #3 300-30 MG PO TABS: 1.0000 | ORAL_TABLET | Freq: Two times a day (BID) | ORAL | 0 refills | Status: DC | PRN

## 2019-12-04 NOTE — Telephone Encounter (Signed)
Pt daughter wanted a call back her mother is having breakthrough  trigeminal nerve pain They have called the Neurologist but haven't heard back

## 2019-12-04 NOTE — Telephone Encounter (Signed)
Patient caregiver has been notified.

## 2019-12-04 NOTE — Telephone Encounter (Signed)
Pt daughter called back to inform us that Neurology called her back and they increased her gabapentin to 200mg  twice a day

## 2019-12-04 NOTE — Telephone Encounter (Signed)
Sent Tylenol # 3 for pain  Call daughter  She needs to f/u with neurology call back and leave message  I dont manage Trigeminal Neuralgia   [place]

## 2019-12-31 DIAGNOSIS — E119 Type 2 diabetes mellitus without complications: Secondary | ICD-10-CM | POA: Diagnosis not present

## 2019-12-31 LAB — HM DIABETES EYE EXAM

## 2020-01-01 ENCOUNTER — Other Ambulatory Visit: Payer: Self-pay

## 2020-01-01 ENCOUNTER — Ambulatory Visit (INDEPENDENT_AMBULATORY_CARE_PROVIDER_SITE_OTHER): Payer: Medicare PPO | Admitting: Internal Medicine

## 2020-01-01 ENCOUNTER — Encounter: Payer: Self-pay | Admitting: Internal Medicine

## 2020-01-01 VITALS — Ht 67.0 in | Wt 188.0 lb

## 2020-01-01 DIAGNOSIS — R600 Localized edema: Secondary | ICD-10-CM

## 2020-01-01 DIAGNOSIS — E039 Hypothyroidism, unspecified: Secondary | ICD-10-CM

## 2020-01-01 DIAGNOSIS — N3000 Acute cystitis without hematuria: Secondary | ICD-10-CM | POA: Diagnosis not present

## 2020-01-01 DIAGNOSIS — E119 Type 2 diabetes mellitus without complications: Secondary | ICD-10-CM

## 2020-01-01 MED ORDER — FUROSEMIDE 20 MG PO TABS: 20.0000 mg | ORAL_TABLET | Freq: Every day | ORAL | 3 refills | Status: DC

## 2020-01-01 NOTE — Progress Notes (Signed)
Telephone Note I connected with Jessica Erickson  on 01/01/20 at  2:30 PM EST by telephone and verified that I am speaking with the correct person using two identifiers.  Location patient: home Location provider:work or home office Persons participating in the virtual visit: patient, provider, Jessica Erickson caretaker   I discussed the limitations of evaluation and management by telemedicine and the availability of in person appointments. The patient expressed understanding and agreed to proceed.   HPI: 1. Dm 2 A1c 6.0 on januvia 50 mg qd  Eye exam 12/31/19 will get records AE Foot exam f/u podiatry  2. H/o UTI no complaints today of burning 3. Depression on remeron 15 mg qhs but mood fluctuates  4. Hypothyroidism on synthyroid 200 mcg qd   ROS: See pertinent positives and negatives per HPI. CV: denies CP Lungs: denies sob  Psych: caretaker says mood fluctuates at times sad due to running out of money and having to move in with son Jessica Erickson   Past Medical History:  Diagnosis Date  . Acoustic neuroma ([place])   . Allergy   . Asthma   . Bilateral swelling of feet    and legs  . Bladder infection   . CAD (coronary artery disease)   . Cataract   . Change in voice   . Compression fracture of body of thoracic vertebra (HCC)    T12 09/18/15 MRI s/p fall   . Constipation   . COPD (chronic obstructive pulmonary disease) ([facility])    previous CXR with chronic interstitial lung dz   . CVA (cerebral vascular accident) ([place])   . Depression   . Diabetes ([employer])    with neuropathy  . Diabetes mellitus, type 2 ([place])   . Diarrhea   . Double vision   . DVT (deep venous thrombosis) ([place])    right leg 10/2015 was on coumadin off as of 2017/2018 ; s/p IVC filter  . Enuresis   . Eye pain, right   . Fall   . Fatty liver    09/15/15 also mildly dilated pancreatitic duct rec MRCP small sub cm cyst hemangioma speeln mild right hydronephrorossi and prox. hydroureter, kidney stones, mild scarring kidneys  . Female  stress incontinence   . Flank pain   . GERD (gastroesophageal reflux disease)    with small hiatal hernia   . Hard of hearing   . Heart disease   . History of kidney problems   . Hyperlipidemia    mixed  . Hypertension   . Hypothyroidism, postsurgical   . Impaired mobility and ADLs    uses rolling walker has caretaker 24/7 at home  . Leg edema   . Mixed incontinence urge and stress (female)(female)   . Neuropathy   . Osteoarthritis    DDD spine   . Osteoporosis with fracture    T12 compression fracture  . Photophobia   . Pulmonary embolism ([place])    10/2015 off coumadin as of 04/2016  . Pulmonary HTN (HCC)    mild pulm HTN, echo 10/09/15 EF 04-54%UJWJX 1 dd, RV systolic pressure increased   . Recurrent UTI   . Sinus pressure   . Skin cancer    BCC jawline and scalp   . Thyroid disease    follows [facility]  . TIA (transient ischemic attack)    MRI 2009/2010 neg stroke   . Trigeminal neuralgia    Dr. Tomi Bamberger s/p gamma knife x 2, on Tegretol since 2011/2012 no increase in dose >200 mg bid rec  per family per neurology   . Urinary frequency   . Urinary, incontinence, stress female    Dr Erlene Quan urology     Past Surgical History:  Procedure Laterality Date  . APPENDECTOMY     as a child, open  . BRAIN SURGERY     schwnnoma removal 1996   . brain tumor surgery    . BREAST SURGERY     breast bx  . CATARACT EXTRACTION    . CHOLECYSTECTOMY    . EYE SURGERY     cataract  . IVC FILTER PLACEMENT ([facility] HX)     Dr. Lucky Cowboy 10/2015   . LAPAROSCOPIC TUBAL LIGATION    . MOHS SURGERY     scalp 04/2014   . PERIPHERAL VASCULAR CATHETERIZATION N/A 10/11/2015   Procedure: IVC Filter Insertion;  Surgeon: Algernon Huxley, MD;  Location: [facility];  Service: Cardiovascular;  Laterality: N/A;  . PERIPHERAL VASCULAR THROMBECTOMY Bilateral 03/29/2018   Procedure: PERIPHERAL VASCULAR THROMBECTOMY;  Surgeon: Algernon Huxley, MD;  Location: [facility];  Service: Cardiovascular;   Laterality: Bilateral;  . PUBOVAGINAL SLING    . THROAT SURGERY    . THYROID SURGERY     tumor around vocal cords   . TOOTH EXTRACTION     winter 2018   . TOTAL THYROIDECTOMY  1976    Family History  Problem Relation Age of Onset  . Heart disease Mother   . Diabetes Father   . Cancer Daughter        breast ca x 2 s/p mastectomy     SOCIAL HX:  Lives at home with caretaker currently Jessica Erickson    Current Outpatient Medications:  .  acetaminophen (TYLENOL) 325 MG tablet, Take 2 tablets (650 mg total) by mouth every 6 (six) hours as needed., Disp: 120 tablet, Rfl: 11 .  acetaminophen-codeine (TYLENOL #3) 300-30 MG tablet, Take 1 tablet by mouth 2 (two) times daily as needed for moderate pain., Disp: 20 tablet, Rfl: 0 .  albuterol (PROVENTIL) (2.5 MG/3ML) 0.083% nebulizer solution, Take 2.5 mg by nebulization 3 (three) times daily., Disp: , Rfl:  .  AMBULATORY NON FORMULARY MEDICATION, Medication Name: Please dispense flutter valve to use three times daily after nebulization. DX:J47.9, Disp: 1 each, Rfl: 0 .  apixaban (ELIQUIS) 2.5 MG TABS tablet, Take 1 tablet (2.5 mg total) by mouth 2 (two) times daily., Disp: 180 tablet, Rfl: 3 .  atorvastatin (LIPITOR) 40 MG tablet, Take 1 tablet (40 mg total) by mouth daily at 6 PM. Generic ok, Disp: 90 tablet, Rfl: 3 .  bisacodyl (DULCOLAX) 5 MG EC tablet, Take 1 tablet (5 mg total) by mouth daily as needed for moderate constipation., Disp: 30 tablet, Rfl: 5 .  budesonide-formoterol (SYMBICORT) 160-4.5 MCG/ACT inhaler, Inhale 2 puffs into the lungs 2 (two) times daily. Rinse mouth, Disp: 1 Inhaler, Rfl: 12 .  Calcium Carbonate-Vitamin D (CALCIUM 600+D) 600-400 MG-UNIT tablet, Take 1 tablet by mouth 2 (two) times daily. Lunch and dinner, Disp: 180 tablet, Rfl: 3 .  cetirizine (ZYRTEC) 10 MG tablet, Take 1 tablet (10 mg total) by mouth daily., Disp: 90 tablet, Rfl: 3 .  DOK 100 MG capsule, TAKE 1 CAPSULE BY MOUTH TWICE DAILY, Disp: 180 capsule, Rfl: 3 .   fluticasone (FLONASE) 50 MCG/ACT nasal spray, Place 1-2 sprays into both nostrils daily. Max 2 sprays, Disp: 16 g, Rfl: 11 .  furosemide (LASIX) 20 MG tablet, Take 1 tablet (20 mg total) by mouth daily. May  take 2nd dose 20 mg if needed at lunch. Not reduced dose to use 1-2 x per day, Disp: 180 tablet, Rfl: 3 .  gabapentin (NEURONTIN) 100 MG capsule, Take 2 capsules (200 mg total) by mouth 2 (two) times daily., Disp: 180 capsule, Rfl: 3 .  guaiFENesin (MUCINEX) 600 MG 12 hr tablet, Take 600 mg by mouth daily. , Disp: , Rfl:  .  ipratropium-albuterol (DUONEB) 0.5-2.5 (3) MG/3ML SOLN, Inhale 3 mLs into the lungs every 4 (four) hours as needed., Disp: 360 mL, Rfl: 12 .  levothyroxine (SYNTHROID) 200 MCG tablet, Take 1 tablet (200 mcg total) by mouth daily before breakfast. Except on Sunday. Do not take with other medications or vitamins, Disp: 90 tablet, Rfl: 3 .  Melatonin 10 MG TABS, Take by mouth. , Disp: , Rfl:  .  meloxicam (MOBIC) 15 MG tablet, Take 1 tablet (15 mg total) by mouth daily as needed for pain., Disp: 30 tablet, Rfl: 11 .  mirtazapine (REMERON) 15 MG tablet, Take 1 tablet (15 mg total) by mouth at bedtime., Disp: 90 tablet, Rfl: 3 .  Multiple Vitamin (MULTIVITAMIN WITH MINERALS) TABS tablet, Take 1 tablet by mouth daily., Disp: , Rfl:  .  ondansetron (ZOFRAN) 4 MG tablet, Take 1 tablet (4 mg total) by mouth every 8 (eight) hours as needed for nausea or vomiting., Disp: 30 tablet, Rfl: 1 .  pantoprazole (PROTONIX) 40 MG tablet, Take 1 tablet (40 mg total) by mouth daily. 30 minutes before lunch or dinner, Disp: 90 tablet, Rfl: 3 .  polyethylene glycol powder (GLYCOLAX/MIRALAX) powder, Take 17 g by mouth daily., Disp: 255 g, Rfl: 11 .  polyvinyl alcohol (LIQUIFILM TEARS) 1.4 % ophthalmic solution, Place 1 drop into both eyes as needed for dry eyes., Disp: 15 mL, Rfl: 11 .  senna-docusate (SENOKOT-S) 8.6-50 MG tablet, Take 1 tablet by mouth at bedtime as needed for mild constipation.,  Disp: , Rfl:  .  sitaGLIPtin (JANUVIA) 50 MG tablet, Take 1 tablet (50 mg total) by mouth daily., Disp: 90 tablet, Rfl: 3 .  tiotropium (SPIRIVA) 18 MCG inhalation capsule, Place 1 capsule (18 mcg total) into inhaler and inhale daily., Disp: 90 capsule, Rfl: 3  EXAM:  VITALS per patient if applicable:  GENERAL: alert, oriented, appears well and in no acute distress  PSYCH/NEURO: pleasant and cooperative, no obvious depression or anxiety, speech and thought processing grossly intact  ASSESSMENT AND PLAN:  Discussed the following assessment and plan:  Type 2 diabetes mellitus without complication, without long-term current use of insulin (HCC) A1C 6.0 09/30/19  Cont meds  Monitor labs  Get records AE 12/31/19   Bilateral leg edema - Plan: furosemide (LASIX) 20 MG tablet bid prn refilled  Hypothyroidism, unspecified type Cont meds labs tsh to follow   HM Flugiven today 09/30/19 utd prevnar had 09/20/16 Tdap 08/04/16 shingrixnot had out of stock called pharmacy,? zostavaxif had MMR immune covid 2nd dose sch 01/05/20 did well with 1st dose   Est dermatology Out of age window pap, colonoscopy, mammo DEXA 01/01/08 osteopeniawill make sure on ca 600 bid and vit D 1000 iu qd -Vitamin d 03/15/18 48.1 -disc with patient and family at f/u if want to pursue in future    -we discussed possible serious and likely etiologies, options for evaluation and workup, limitations of telemedicine visit vs in person visit, treatment, treatment risks and precautions. Pt prefers to treat via telemedicine empirically rather then risking or undertaking an in person visit at this moment. Patient agrees  to seek prompt in person care if worsening, new symptoms arise, or if is not improving with treatment.   I discussed the assessment and treatment plan with the patient. The patient was provided an opportunity to ask questions and all were answered. The patient agreed with the plan and demonstrated an  understanding of the instructions.   The patient was advised to call back or seek an in-person evaluation if the symptoms worsen or if the condition fails to improve as anticipated.  Time spent 20 minutes  Delorise Jackson, MD

## 2020-01-12 ENCOUNTER — Other Ambulatory Visit: Payer: Self-pay

## 2020-01-12 ENCOUNTER — Ambulatory Visit (INDEPENDENT_AMBULATORY_CARE_PROVIDER_SITE_OTHER): Payer: Medicare PPO | Admitting: Podiatry

## 2020-01-12 ENCOUNTER — Encounter: Payer: Self-pay | Admitting: Internal Medicine

## 2020-01-12 ENCOUNTER — Encounter: Payer: Self-pay | Admitting: Podiatry

## 2020-01-12 DIAGNOSIS — E1140 Type 2 diabetes mellitus with diabetic neuropathy, unspecified: Secondary | ICD-10-CM | POA: Diagnosis not present

## 2020-01-12 DIAGNOSIS — B351 Tinea unguium: Secondary | ICD-10-CM

## 2020-01-12 DIAGNOSIS — M79676 Pain in unspecified toe(s): Secondary | ICD-10-CM | POA: Diagnosis not present

## 2020-01-12 DIAGNOSIS — D689 Coagulation defect, unspecified: Secondary | ICD-10-CM | POA: Diagnosis not present

## 2020-01-12 NOTE — Progress Notes (Signed)
Complaint:  Visit Type: Patient returns to my office for continued preventative foot care services. Complaint: Patient states" my nails have grown long and thick and become painful to walk and wear shoes" Patient has been diagnosed with DM with neuropathy. The patient presents for preventative foot care services. No changes to ROS.  Patient is taking coumadin. Patient has painful callus right foot.  Podiatric Exam: Vascular: dorsalis pedis and posterior tibial pulses are palpable bilateral. Capillary return is immediate. Temperature gradient is WNL. Skin turgor WNL  Sensorium: Diminished Semmes Weinstein monofilament test. Normal tactile sensation bilaterally. Nail Exam: Pt has thick disfigured discolored nails with subungual debris noted bilateral entire nail hallux through fifth toenails Ulcer Exam: There is no evidence of ulcer or pre-ulcerative changes or infection. Orthopedic Exam: Muscle tone and strength are WNL. No limitations in general ROM. No crepitus or effusions noted. Foot type and digits show no abnormalities. HAV  B/L. Skin: No Porokeratosis. No infection or ulcers.  Pinch callus right foot.  Diagnosis:  Onychomycosis, , Pain in right toe, pain in left toes Debride callus.  Treatment & Plan Procedures and Treatment: Consent by patient was obtained for treatment procedures.   Debridement of mycotic and hypertrophic toenails, 1 through 5 bilateral and clearing of subungual debris. No ulceration, no infection noted.  Return Visit-Office Procedure: Patient instructed to return to the office for a follow up visit 3 months for continued evaluation and treatment.    Gardiner Barefoot DPM

## 2020-01-19 ENCOUNTER — Telehealth: Payer: Self-pay | Admitting: Internal Medicine

## 2020-01-19 NOTE — Telephone Encounter (Signed)
Jessica Erickson has been informed. Patient is taking mucinex every night.

## 2020-01-19 NOTE — Telephone Encounter (Signed)
It can be used as needed for cough and mucous production  But she must use the flutter valve daily to 2x per day   Inform nancy this   Ward

## 2020-01-19 NOTE — Telephone Encounter (Signed)
Patient's daughter called, [name] [phone]. Patient is taking 600 mg of mucinex,  maxium strength 2x a day, every 12 hours. Nancey would like to know if her mother really needs to take this on a daily bases. Please call her.

## 2020-03-08 ENCOUNTER — Other Ambulatory Visit (INDEPENDENT_AMBULATORY_CARE_PROVIDER_SITE_OTHER): Payer: Medicare PPO

## 2020-03-08 ENCOUNTER — Other Ambulatory Visit: Payer: Self-pay

## 2020-03-08 ENCOUNTER — Inpatient Hospital Stay: Payer: Medicare PPO | Attending: Oncology

## 2020-03-08 DIAGNOSIS — E039 Hypothyroidism, unspecified: Secondary | ICD-10-CM | POA: Diagnosis not present

## 2020-03-08 DIAGNOSIS — N3000 Acute cystitis without hematuria: Secondary | ICD-10-CM

## 2020-03-08 DIAGNOSIS — E119 Type 2 diabetes mellitus without complications: Secondary | ICD-10-CM | POA: Diagnosis not present

## 2020-03-08 LAB — CBC WITH DIFFERENTIAL/PLATELET
Basophils Absolute: 0 10*3/uL (ref 0.0–0.1)
Basophils Relative: 0.8 % (ref 0.0–3.0)
Eosinophils Absolute: 0.2 10*3/uL (ref 0.0–0.7)
Eosinophils Relative: 4.3 % (ref 0.0–5.0)
HCT: 41.1 % (ref 36.0–46.0)
Hemoglobin: 13.5 g/dL (ref 12.0–15.0)
Lymphocytes Relative: 30.5 % (ref 12.0–46.0)
Lymphs Abs: 1.5 10*3/uL (ref 0.7–4.0)
MCHC: 32.8 g/dL (ref 30.0–36.0)
MCV: 94.5 fl (ref 78.0–100.0)
Monocytes Absolute: 0.5 10*3/uL (ref 0.1–1.0)
Monocytes Relative: 9.8 % (ref 3.0–12.0)
Neutro Abs: 2.7 10*3/uL (ref 1.4–7.7)
Neutrophils Relative %: 54.6 % (ref 43.0–77.0)
Platelets: 144 10*3/uL — ABNORMAL LOW (ref 150.0–400.0)
RBC: 4.35 Mil/uL (ref 3.87–5.11)
RDW: 14.5 % (ref 11.5–15.5)
WBC: 4.9 10*3/uL (ref 4.0–10.5)

## 2020-03-08 LAB — COMPREHENSIVE METABOLIC PANEL
ALT: 11 U/L (ref 0–35)
AST: 20 U/L (ref 0–37)
Albumin: 3.5 g/dL (ref 3.5–5.2)
Alkaline Phosphatase: 53 U/L (ref 39–117)
BUN: 17 mg/dL (ref 6–23)
CO2: 34 mEq/L — ABNORMAL HIGH (ref 19–32)
Calcium: 8.9 mg/dL (ref 8.4–10.5)
Chloride: 104 mEq/L (ref 96–112)
Creatinine, Ser: 1.28 mg/dL — ABNORMAL HIGH (ref 0.40–1.20)
GFR: 39.01 mL/min — ABNORMAL LOW (ref >60.00)
Glucose, Bld: 102 mg/dL — ABNORMAL HIGH (ref 70–99)
Potassium: 4.2 mEq/L (ref 3.5–5.1)
Sodium: 143 mEq/L (ref 135–145)
Total Bilirubin: 0.6 mg/dL (ref 0.2–1.2)
Total Protein: 6.3 g/dL (ref 6.0–8.3)

## 2020-03-08 LAB — URINALYSIS, ROUTINE W REFLEX MICROSCOPIC
Bacteria, UA: NONE SEEN /HPF
Bilirubin Urine: NEGATIVE
Glucose, UA: NEGATIVE
Hgb urine dipstick: NEGATIVE
Hyaline Cast: NONE SEEN /LPF
Nitrite: NEGATIVE
Protein, ur: NEGATIVE
Specific Gravity, Urine: 1.022 (ref 1.001–1.03)
pH: 7 (ref 5.0–8.0)

## 2020-03-08 LAB — HEMOGLOBIN A1C: Hgb A1c MFr Bld: 5.9 % (ref 4.6–6.5)

## 2020-03-08 LAB — LIPID PANEL
Cholesterol: 121 mg/dL (ref 0–200)
HDL: 45.7 mg/dL (ref >39.00)
LDL Cholesterol: 58 mg/dL (ref 0–99)
NonHDL: 75.26
Total CHOL/HDL Ratio: 3
Triglycerides: 88 mg/dL (ref 0.0–149.0)
VLDL: 17.6 mg/dL (ref 0.0–40.0)

## 2020-03-08 LAB — TSH: TSH: 2.34 u[IU]/mL (ref 0.35–4.50)

## 2020-03-09 ENCOUNTER — Encounter: Payer: Self-pay | Admitting: Oncology

## 2020-03-09 ENCOUNTER — Inpatient Hospital Stay (HOSPITAL_BASED_OUTPATIENT_CLINIC_OR_DEPARTMENT_OTHER): Payer: Medicare PPO | Admitting: Oncology

## 2020-03-09 DIAGNOSIS — Z86718 Personal history of other venous thrombosis and embolism: Secondary | ICD-10-CM

## 2020-03-09 DIAGNOSIS — Z95828 Presence of other vascular implants and grafts: Secondary | ICD-10-CM | POA: Diagnosis not present

## 2020-03-09 DIAGNOSIS — Z7901 Long term (current) use of anticoagulants: Secondary | ICD-10-CM

## 2020-03-09 NOTE — Progress Notes (Signed)
HEMATOLOGY-ONCOLOGY TeleHEALTH VISIT PROGRESS NOTE  I connected with Jessica Erickson on 03/09/20 at  2:00 PM EDT by video enabled telemedicine visit and verified that I am speaking with the correct person using two identifiers. I discussed the limitations, risks, security and privacy concerns of performing an evaluation and management service by telemedicine and the availability of in-person appointments. I also discussed with the patient that there may be a patient responsible charge related to this service. The patient expressed understanding and agreed to proceed.   Other persons participating in the visit and their role in the encounter:  Caregiver, [name]  Patient's location: Home  Provider's location: office Chief Complaint: Follow-up for chronic anticoagulation/history of DVT   INTERVAL HISTORY Jessica Erickson is a 86 y.o. female who has above history reviewed by me today presents for follow up visit for management of chronic anticoagulation/history of DVT Problems and complaints are listed below:  I attempted to connect the patient for visual enabled telehealth visit .  Due to the her hearing difficulties with video,  Patient was transitioned to audio only visit. Patient reports no new complaints.  Denies any pain.  Easy bruising.  No bleeding events. Lower extremity edema has resolved.  Review of Systems  Unable to perform ROS: Age  Constitutional: Negative for fever.  HENT:   Positive for hearing loss.   Cardiovascular: Negative for leg swelling.  Hematological: Bruises/bleeds easily.    Past Medical History:  Diagnosis Date  . Acoustic neuroma ([place])   . Allergy   . Asthma   . Bilateral swelling of feet    and legs  . Bladder infection   . CAD (coronary artery disease)   . Cataract   . Change in voice   . Compression fracture of body of thoracic vertebra (HCC)    T12 09/18/15 MRI s/p fall   . Constipation   . COPD (chronic obstructive pulmonary disease) ([place])     previous CXR with chronic interstitial lung dz   . CVA (cerebral vascular accident) ([place])   . Depression   . Diabetes ([place])    with neuropathy  . Diabetes mellitus, type 2 ([place])   . Diarrhea   . Double vision   . DVT (deep venous thrombosis) ([place])    right leg 10/2015 was on coumadin off as of 2017/2018 ; s/p IVC filter  . Enuresis   . Eye pain, right   . Fall   . Fatty liver    09/15/15 also mildly dilated pancreatitic duct rec MRCP small sub cm cyst hemangioma speeln mild right hydronephrorossi and prox. hydroureter, kidney stones, mild scarring kidneys  . Female stress incontinence   . Flank pain   . GERD (gastroesophageal reflux disease)    with small hiatal hernia   . Hard of hearing   . Heart disease   . History of kidney problems   . Hyperlipidemia    mixed  . Hypertension   . Hypothyroidism, postsurgical   . Impaired mobility and ADLs    uses rolling walker has caretaker 24/7 at home  . Leg edema   . Mixed incontinence urge and stress (female)(female)   . Neuropathy   . Osteoarthritis    DDD spine   . Osteoporosis with fracture    T12 compression fracture  . Photophobia   . Pulmonary embolism ([place])    10/2015 off coumadin as of 04/2016  . Pulmonary HTN (HCC)    mild pulm HTN, echo 10/09/15 EF 55-60%grade 1 dd, RV  systolic pressure increased   . Recurrent UTI   . Sinus pressure   . Skin cancer    BCC jawline and scalp   . Thyroid disease    follows [facility]  . TIA (transient ischemic attack)    MRI 2009/2010 neg stroke   . Trigeminal neuralgia    Dr. Tomi Bamberger s/p gamma knife x 2, on Tegretol since 2011/2012 no increase in dose >200 mg bid rec per family per neurology   . Urinary frequency   . Urinary, incontinence, stress female    Dr Erlene Quan urology    Past Surgical History:  Procedure Laterality Date  . APPENDECTOMY     as a child, open  . BRAIN SURGERY     schwnnoma removal 1996   . brain tumor surgery    . BREAST SURGERY     breast bx  .  CATARACT EXTRACTION    . CHOLECYSTECTOMY    . EYE SURGERY     cataract  . IVC FILTER PLACEMENT ([facility] HX)     Dr. Lucky Cowboy 10/2015   . LAPAROSCOPIC TUBAL LIGATION    . MOHS SURGERY     scalp 04/2014   . PERIPHERAL VASCULAR CATHETERIZATION N/A 10/11/2015   Procedure: IVC Filter Insertion;  Surgeon: Algernon Huxley, MD;  Location: [facility];  Service: Cardiovascular;  Laterality: N/A;  . PERIPHERAL VASCULAR THROMBECTOMY Bilateral 03/29/2018   Procedure: PERIPHERAL VASCULAR THROMBECTOMY;  Surgeon: Algernon Huxley, MD;  Location: [facility];  Service: Cardiovascular;  Laterality: Bilateral;  . PUBOVAGINAL SLING    . THROAT SURGERY    . THYROID SURGERY     tumor around vocal cords   . TOOTH EXTRACTION     winter 2018   . TOTAL THYROIDECTOMY  1976    Family History  Problem Relation Age of Onset  . Heart disease Mother   . Diabetes Father   . Cancer Daughter        breast ca x 2 s/p mastectomy     Social History   Socioeconomic History  . Marital status: Widowed    Spouse name: Not on file  . Number of children: 5  . Years of education: Not on file  . Highest education level: Not on file  Occupational History  . Occupation: retired  Tobacco Use  . Smoking status: Former Smoker    Packs/day: 0.50    Years: 20.00    Pack years: 10.00    Types: Cigarettes    Quit date: 09/20/1995    Years since quitting: 24.4  . Smokeless tobacco: Never Used  . Tobacco comment: quit 1996 smoked 20 years max 8 cig qd   Substance and Sexual Activity  . Alcohol use: No  . Drug use: No  . Sexual activity: Not on file  Other Topics Concern  . Not on file  Social History Narrative   Lives at home with caretaker currently [name]    Has children    Likes to ride stationary bike and sew             Social Determinants of Health   Financial Resource Strain:   . Difficulty of Paying Living Expenses:   Food Insecurity:   . Worried About Charity fundraiser in the Last Year:   . Arts development officer in the Last Year:   Transportation Needs:   . Film/video editor (Medical):   Marland Kitchen Lack of Transportation (Non-Medical):   Physical Activity:  Sufficiently Active  . Days of Exercise per Week: 4 days  . Minutes of Exercise per Session: 40 min  Stress:   . Feeling of Stress :   Social Connections:   . Frequency of Communication with Friends and Family:   . Frequency of Social Gatherings with Friends and Family:   . Attends Religious Services:   . Active Member of Clubs or Organizations:   . Attends Archivist Meetings:   Marland Kitchen Marital Status:   Intimate Partner Violence:   . Fear of Current or Ex-Partner:   . Emotionally Abused:   Marland Kitchen Physically Abused:   . Sexually Abused:     Current Outpatient Medications on File Prior to Visit  Medication Sig Dispense Refill  . acetaminophen (TYLENOL) 325 MG tablet Take 2 tablets (650 mg total) by mouth every 6 (six) hours as needed. 120 tablet 11  . acetaminophen-codeine (TYLENOL #3) 300-30 MG tablet Take 1 tablet by mouth 2 (two) times daily as needed for moderate pain. 20 tablet 0  . albuterol (PROVENTIL) (2.5 MG/3ML) 0.083% nebulizer solution Take 2.5 mg by nebulization 3 (three) times daily.    . AMBULATORY NON FORMULARY MEDICATION Medication Name: Please dispense flutter valve to use three times daily after nebulization. DX:J47.9 1 each 0  . apixaban (ELIQUIS) 2.5 MG TABS tablet Take 1 tablet (2.5 mg total) by mouth 2 (two) times daily. 180 tablet 3  . atorvastatin (LIPITOR) 40 MG tablet Take 1 tablet (40 mg total) by mouth daily at 6 PM. Generic ok 90 tablet 3  . bisacodyl (DULCOLAX) 5 MG EC tablet Take 1 tablet (5 mg total) by mouth daily as needed for moderate constipation. 30 tablet 5  . budesonide-formoterol (SYMBICORT) 160-4.5 MCG/ACT inhaler Inhale 2 puffs into the lungs 2 (two) times daily. Rinse mouth 1 Inhaler 12  . Calcium Carbonate-Vitamin D (CALCIUM 600+D) 600-400 MG-UNIT tablet Take 1 tablet by mouth 2 (two)  times daily. Lunch and dinner 180 tablet 3  . cetirizine (ZYRTEC) 10 MG tablet Take 1 tablet (10 mg total) by mouth daily. 90 tablet 3  . DOK 100 MG capsule TAKE 1 CAPSULE BY MOUTH TWICE DAILY 180 capsule 3  . fluticasone (FLONASE) 50 MCG/ACT nasal spray Place 1-2 sprays into both nostrils daily. Max 2 sprays 16 g 11  . furosemide (LASIX) 20 MG tablet Take 1 tablet (20 mg total) by mouth daily. May take 2nd dose 20 mg if needed at lunch. Not reduced dose to use 1-2 x per day 180 tablet 3  . gabapentin (NEURONTIN) 100 MG capsule Take 2 capsules (200 mg total) by mouth 2 (two) times daily. 180 capsule 3  . guaiFENesin (MUCINEX) 600 MG 12 hr tablet Take 600 mg by mouth daily.     Marland Kitchen ipratropium-albuterol (DUONEB) 0.5-2.5 (3) MG/3ML SOLN Inhale 3 mLs into the lungs every 4 (four) hours as needed. 360 mL 12  . levothyroxine (SYNTHROID) 200 MCG tablet Take 1 tablet (200 mcg total) by mouth daily before breakfast. Except on Sunday. Do not take with other medications or vitamins 90 tablet 3  . Melatonin 10 MG TABS Take by mouth.     . meloxicam (MOBIC) 15 MG tablet Take 1 tablet (15 mg total) by mouth daily as needed for pain. 30 tablet 11  . mirtazapine (REMERON) 15 MG tablet Take 1 tablet (15 mg total) by mouth at bedtime. 90 tablet 3  . Multiple Vitamin (MULTIVITAMIN WITH MINERALS) TABS tablet Take 1 tablet by mouth daily.    Marland Kitchen  ondansetron (ZOFRAN) 4 MG tablet Take 1 tablet (4 mg total) by mouth every 8 (eight) hours as needed for nausea or vomiting. 30 tablet 1  . pantoprazole (PROTONIX) 40 MG tablet Take 1 tablet (40 mg total) by mouth daily. 30 minutes before lunch or dinner 90 tablet 3  . polyethylene glycol powder (GLYCOLAX/MIRALAX) powder Take 17 g by mouth daily. 255 g 11  . polyvinyl alcohol (LIQUIFILM TEARS) 1.4 % ophthalmic solution Place 1 drop into both eyes as needed for dry eyes. 15 mL 11  . senna-docusate (SENOKOT-S) 8.6-50 MG tablet Take 1 tablet by mouth at bedtime as needed for mild  constipation.    . sitaGLIPtin (JANUVIA) 50 MG tablet Take 1 tablet (50 mg total) by mouth daily. 90 tablet 3  . tiotropium (SPIRIVA) 18 MCG inhalation capsule Place 1 capsule (18 mcg total) into inhaler and inhale daily. 90 capsule 3   No current facility-administered medications on file prior to visit.    Allergies  Allergen Reactions  . Penicillins Shortness Of Breath, Rash and Other (See Comments)    Has patient had a PCN reaction causing immediate rash, facial/tongue/throat swelling, SOB or lightheadedness with hypotension: Yes Has patient had a PCN reaction causing severe rash involving mucus membranes or skin necrosis: No Has patient had a PCN reaction that required hospitalization No Has patient had a PCN reaction occurring within the last 10 years: No If all of the above answers are "NO", then may proceed with Cephalosporin use.  . Sulfa Antibiotics Shortness Of Breath, Rash and Other (See Comments)  . Amitiza [Lubiprostone]     N/v/d  . Aspirin Other (See Comments)    Reaction:  Unknown  Other reaction(s): Bleeding (intolerance) Per patient " causes nose to bleed" Can take 81 mg daily without any complications Other reaction(s): "bloody nose"   . Penicillin G Other (See Comments)    Has patient had a PCN reaction causing immediate rash, facial/tongue/throat swelling, SOB or lightheadedness with hypotension: No Has patient had a PCN reaction causing severe rash involving mucus membranes or skin necrosis: Unknown Has patient had a PCN reaction that required hospitalization: Unknown Has patient had a PCN reaction occurring within the last 10 years: Unknown If all of the above answers are "NO", then may proceed with Cephalosporin use.       Observations/Objective: There were no vitals filed for this visit. There is no height or weight on file to calculate BMI.  Physical Exam  CBC    Component Value Date/Time   WBC 4.9 03/08/2020 0955   RBC 4.35 03/08/2020 0955   HGB  13.5 03/08/2020 0955   HCT 41.1 03/08/2020 0955   PLT 144.0 (L) 03/08/2020 0955   MCV 94.5 03/08/2020 0955   MCH 29.7 11/07/2019 1250   MCHC 32.8 03/08/2020 0955   RDW 14.5 03/08/2020 0955   LYMPHSABS 1.5 03/08/2020 0955   MONOABS 0.5 03/08/2020 0955   EOSABS 0.2 03/08/2020 0955   BASOSABS 0.0 03/08/2020 0955    CMP     Component Value Date/Time   NA 143 03/08/2020 0955   K 4.2 03/08/2020 0955   CL 104 03/08/2020 0955   CO2 34 (H) 03/08/2020 0955   GLUCOSE 102 (H) 03/08/2020 0955   BUN 17 03/08/2020 0955   CREATININE 1.28 (H) 03/08/2020 0955   CALCIUM 8.9 03/08/2020 0955   PROT 6.3 03/08/2020 0955   ALBUMIN 3.5 03/08/2020 0955   AST 20 03/08/2020 0955   ALT 11 03/08/2020 0955   ALKPHOS  53 03/08/2020 0955   BILITOT 0.6 03/08/2020 0955   GFRNONAA 41 (L) 11/07/2019 1250   GFRAA 48 (L) 11/07/2019 1250     Assessment and Plan: 1. History of deep vein thrombosis (DVT) of lower extremity     Labs reviewed and discussed with patient. Kidney functions are stable.  Stable hemoglobin. Continue anticoagulation with Eliquis 2.5 mg twice daily.   Follow Up Instructions: Follow-up in 4 months.   I discussed the assessment and treatment plan with the patient. The patient was provided an opportunity to ask questions and all were answered. The patient agreed with the plan and demonstrated an understanding of the instructions.  The patient was advised to call back or seek an in-person evaluation if the symptoms worsen or if the condition fails to improve as anticipated.    Earlie Server, MD 03/09/2020 5:08 PM

## 2020-03-09 NOTE — Progress Notes (Signed)
Patient contacted for Mychart visit.

## 2020-04-02 ENCOUNTER — Other Ambulatory Visit: Payer: Self-pay

## 2020-04-06 ENCOUNTER — Other Ambulatory Visit: Payer: Self-pay

## 2020-04-06 ENCOUNTER — Encounter: Payer: Self-pay | Admitting: Internal Medicine

## 2020-04-06 ENCOUNTER — Other Ambulatory Visit: Payer: Self-pay | Admitting: Internal Medicine

## 2020-04-06 ENCOUNTER — Ambulatory Visit (INDEPENDENT_AMBULATORY_CARE_PROVIDER_SITE_OTHER): Payer: Medicare PPO | Admitting: Internal Medicine

## 2020-04-06 VITALS — BP 118/76 | HR 73 | Temp 97.3°F | Ht 67.0 in | Wt 197.4 lb

## 2020-04-06 DIAGNOSIS — D696 Thrombocytopenia, unspecified: Secondary | ICD-10-CM

## 2020-04-06 DIAGNOSIS — L821 Other seborrheic keratosis: Secondary | ICD-10-CM

## 2020-04-06 DIAGNOSIS — E1121 Type 2 diabetes mellitus with diabetic nephropathy: Secondary | ICD-10-CM

## 2020-04-06 DIAGNOSIS — N1832 Chronic kidney disease, stage 3b: Secondary | ICD-10-CM | POA: Diagnosis not present

## 2020-04-06 DIAGNOSIS — L309 Dermatitis, unspecified: Secondary | ICD-10-CM | POA: Diagnosis not present

## 2020-04-06 DIAGNOSIS — F339 Major depressive disorder, recurrent, unspecified: Secondary | ICD-10-CM

## 2020-04-06 DIAGNOSIS — H04123 Dry eye syndrome of bilateral lacrimal glands: Secondary | ICD-10-CM

## 2020-04-06 DIAGNOSIS — E119 Type 2 diabetes mellitus without complications: Secondary | ICD-10-CM

## 2020-04-06 DIAGNOSIS — E1122 Type 2 diabetes mellitus with diabetic chronic kidney disease: Secondary | ICD-10-CM

## 2020-04-06 DIAGNOSIS — K219 Gastro-esophageal reflux disease without esophagitis: Secondary | ICD-10-CM

## 2020-04-06 DIAGNOSIS — K5900 Constipation, unspecified: Secondary | ICD-10-CM

## 2020-04-06 LAB — CBC WITH DIFFERENTIAL/PLATELET
Basophils Absolute: 0 10*3/uL (ref 0.0–0.1)
Basophils Relative: 0.4 % (ref 0.0–3.0)
Eosinophils Absolute: 0.2 10*3/uL (ref 0.0–0.7)
Eosinophils Relative: 4 % (ref 0.0–5.0)
HCT: 42.3 % (ref 36.0–46.0)
Hemoglobin: 14.2 g/dL (ref 12.0–15.0)
Lymphocytes Relative: 24.8 % (ref 12.0–46.0)
Lymphs Abs: 1.5 10*3/uL (ref 0.7–4.0)
MCHC: 33.5 g/dL (ref 30.0–36.0)
MCV: 94.2 fl (ref 78.0–100.0)
Monocytes Absolute: 0.6 10*3/uL (ref 0.1–1.0)
Monocytes Relative: 9.6 % (ref 3.0–12.0)
Neutro Abs: 3.6 10*3/uL (ref 1.4–7.7)
Neutrophils Relative %: 61.2 % (ref 43.0–77.0)
Platelets: 189 10*3/uL (ref 150.0–400.0)
RBC: 4.49 Mil/uL (ref 3.87–5.11)
RDW: 14.1 % (ref 11.5–15.5)
WBC: 5.9 10*3/uL (ref 4.0–10.5)

## 2020-04-06 LAB — BASIC METABOLIC PANEL
BUN: 15 mg/dL (ref 6–23)
CO2: 36 mEq/L — ABNORMAL HIGH (ref 19–32)
Calcium: 8.9 mg/dL (ref 8.4–10.5)
Chloride: 102 mEq/L (ref 96–112)
Creatinine, Ser: 1.24 mg/dL — ABNORMAL HIGH (ref 0.40–1.20)
GFR: 40.46 mL/min — ABNORMAL LOW (ref >60.00)
Glucose, Bld: 98 mg/dL (ref 70–99)
Potassium: 4.2 mEq/L (ref 3.5–5.1)
Sodium: 141 mEq/L (ref 135–145)

## 2020-04-06 MED ORDER — HYDROCORTISONE 2.5 % EX CREA: TOPICAL_CREAM | Freq: Two times a day (BID) | CUTANEOUS | 11 refills | Status: DC

## 2020-04-06 MED ORDER — PANTOPRAZOLE SODIUM 40 MG PO TBEC: 40.0000 mg | DELAYED_RELEASE_TABLET | Freq: Every day | ORAL | 3 refills | Status: DC

## 2020-04-06 MED ORDER — ATORVASTATIN CALCIUM 40 MG PO TABS: 40.0000 mg | ORAL_TABLET | Freq: Every day | ORAL | 3 refills | Status: DC

## 2020-04-06 MED ORDER — POLYVINYL ALCOHOL 1.4 % OP SOLN: 1.0000 [drp] | OPHTHALMIC | 11 refills | Status: DC | PRN

## 2020-04-06 MED ORDER — BUDESONIDE-FORMOTEROL FUMARATE 160-4.5 MCG/ACT IN AERO: 2.0000 | INHALATION_SPRAY | Freq: Two times a day (BID) | RESPIRATORY_TRACT | 12 refills | Status: DC

## 2020-04-06 MED ORDER — POLYETHYLENE GLYCOL 3350 17 GM/SCOOP PO POWD: 17.0000 g | Freq: Every day | ORAL | 11 refills | Status: DC

## 2020-04-06 NOTE — Progress Notes (Deleted)
No chief complaint on file.  HPI ROS Past Medical History:  Diagnosis Date  . Acoustic neuroma ([place])   . Allergy   . Asthma   . Bilateral swelling of feet    and legs  . Bladder infection   . CAD (coronary artery disease)   . Cataract   . Change in voice   . Compression fracture of body of thoracic vertebra (HCC)    T12 09/18/15 MRI s/p fall   . Constipation   . COPD (chronic obstructive pulmonary disease) ([place])    previous CXR with chronic interstitial lung dz   . CVA (cerebral vascular accident) ([place])   . Depression   . Diabetes ([place])    with neuropathy  . Diabetes mellitus, type 2 ([place])   . Diarrhea   . Double vision   . DVT (deep venous thrombosis) ([place])    right leg 10/2015 was on coumadin off as of 2017/2018 ; s/p IVC filter  . Enuresis   . Eye pain, right   . Fall   . Fatty liver    09/15/15 also mildly dilated pancreatitic duct rec MRCP small sub cm cyst hemangioma speeln mild right hydronephrorossi and prox. hydroureter, kidney stones, mild scarring kidneys  . Female stress incontinence   . Flank pain   . GERD (gastroesophageal reflux disease)    with small hiatal hernia   . Hard of hearing   . Heart disease   . History of kidney problems   . Hyperlipidemia    mixed  . Hypertension   . Hypothyroidism, postsurgical   . Impaired mobility and ADLs    uses rolling walker has caretaker 24/7 at home  . Leg edema   . Mixed incontinence urge and stress (female)(female)   . Neuropathy   . Osteoarthritis    DDD spine   . Osteoporosis with fracture    T12 compression fracture  . Photophobia   . Pulmonary embolism ([place])    10/2015 off coumadin as of 04/2016  . Pulmonary HTN (HCC)    mild pulm HTN, echo 10/09/15 EF 15-17%OHYWV 1 dd, RV systolic pressure increased   . Recurrent UTI   . Sinus pressure   . Skin cancer    BCC jawline and scalp   . Thyroid disease    follows [facility]  . TIA (transient ischemic attack)    MRI 2009/2010 neg stroke   .  Trigeminal neuralgia    Dr. Tomi Bamberger s/p gamma knife x 2, on Tegretol since 2011/2012 no increase in dose >200 mg bid rec per family per neurology   . Urinary frequency   . Urinary, incontinence, stress female    Dr Erlene Quan urology    Past Surgical History:  Procedure Laterality Date  . APPENDECTOMY     as a child, open  . BRAIN SURGERY     schwnnoma removal 1996   . brain tumor surgery    . BREAST SURGERY     breast bx  . CATARACT EXTRACTION    . CHOLECYSTECTOMY    . EYE SURGERY     cataract  . IVC FILTER PLACEMENT ([facility] HX)     Dr. Lucky Cowboy 10/2015   . LAPAROSCOPIC TUBAL LIGATION    . MOHS SURGERY     scalp 04/2014   . PERIPHERAL VASCULAR CATHETERIZATION N/A 10/11/2015   Procedure: IVC Filter Insertion;  Surgeon: Algernon Huxley, MD;  Location: [facility];  Service: Cardiovascular;  Laterality: N/A;  . PERIPHERAL VASCULAR THROMBECTOMY  Bilateral 03/29/2018   Procedure: PERIPHERAL VASCULAR THROMBECTOMY;  Surgeon: Algernon Huxley, MD;  Location: [facility];  Service: Cardiovascular;  Laterality: Bilateral;  . PUBOVAGINAL SLING    . THROAT SURGERY    . THYROID SURGERY     tumor around vocal cords   . TOOTH EXTRACTION     winter 2018   . TOTAL THYROIDECTOMY  1976   Family History  Problem Relation Age of Onset  . Heart disease Mother   . Diabetes Father   . Cancer Daughter        breast ca x 2 s/p mastectomy    Social History   Socioeconomic History  . Marital status: Widowed    Spouse name: Not on file  . Number of children: 5  . Years of education: Not on file  . Highest education level: Not on file  Occupational History  . Occupation: retired  Tobacco Use  . Smoking status: Former Smoker    Packs/day: 0.50    Years: 20.00    Pack years: 10.00    Types: Cigarettes    Quit date: 09/20/1995    Years since quitting: 24.5  . Smokeless tobacco: Never Used  . Tobacco comment: quit 1996 smoked 20 years max 8 cig qd   Substance and Sexual Activity  .  Alcohol use: No  . Drug use: No  . Sexual activity: Not on file  Other Topics Concern  . Not on file  Social History Narrative   Lives at home with caretaker currently [name]    Has children    Likes to ride stationary bike and sew             Social Determinants of Health   Financial Resource Strain:   . Difficulty of Paying Living Expenses:   Food Insecurity:   . Worried About Charity fundraiser in the Last Year:   . Arboriculturist in the Last Year:   Transportation Needs:   . Film/video editor (Medical):   Marland Kitchen Lack of Transportation (Non-Medical):   Physical Activity: Sufficiently Active  . Days of Exercise per Week: 4 days  . Minutes of Exercise per Session: 40 min  Stress:   . Feeling of Stress :   Social Connections:   . Frequency of Communication with Friends and Family:   . Frequency of Social Gatherings with Friends and Family:   . Attends Religious Services:   . Active Member of Clubs or Organizations:   . Attends Archivist Meetings:   Marland Kitchen Marital Status:   Intimate Partner Violence:   . Fear of Current or Ex-Partner:   . Emotionally Abused:   Marland Kitchen Physically Abused:   . Sexually Abused:    No outpatient medications have been marked as taking for the 04/06/20 encounter (Orders Only) with McLean-Scocuzza, Nino Glow, MD.   Allergies  Allergen Reactions  . Penicillins Shortness Of Breath, Rash and Other (See Comments)    Has patient had a PCN reaction causing immediate rash, facial/tongue/throat swelling, SOB or lightheadedness with hypotension: Yes Has patient had a PCN reaction causing severe rash involving mucus membranes or skin necrosis: No Has patient had a PCN reaction that required hospitalization No Has patient had a PCN reaction occurring within the last 10 years: No If all of the above answers are "NO", then may proceed with Cephalosporin use.  . Sulfa Antibiotics Shortness Of Breath, Rash and Other (See Comments)  . Amitiza [Lubiprostone]  N/v/d  . Aspirin Other (See Comments)    Reaction:  Unknown  Other reaction(s): Bleeding (intolerance) Per patient " causes nose to bleed" Can take 81 mg daily without any complications Other reaction(s): "bloody nose"   . Penicillin G Other (See Comments)    Has patient had a PCN reaction causing immediate rash, facial/tongue/throat swelling, SOB or lightheadedness with hypotension: No Has patient had a PCN reaction causing severe rash involving mucus membranes or skin necrosis: Unknown Has patient had a PCN reaction that required hospitalization: Unknown Has patient had a PCN reaction occurring within the last 10 years: Unknown If all of the above answers are "NO", then may proceed with Cephalosporin use.   Recent Results (from the past 2160 hour(s))  Hemoglobin A1c     Status: None   Collection Time: 03/08/20  9:55 AM  Result Value Ref Range   Hgb A1c MFr Bld 5.9 4.6 - 6.5 %    Comment: Glycemic Control Guidelines for People with Diabetes:Non Diabetic:  <6%Goal of Therapy: <7%Additional Action Suggested:  >8%   Urinalysis, Routine w reflex microscopic     Status: Abnormal   Collection Time: 03/08/20  9:55 AM  Result Value Ref Range   Color, Urine YELLOW YELLOW   APPearance CLOUDY (A) CLEAR   Specific Gravity, Urine 1.022 1.001 - 1.03   pH 7.0 5.0 - 8.0   Glucose, UA NEGATIVE NEGATIVE   Bilirubin Urine NEGATIVE NEGATIVE   Ketones, ur TRACE (A) NEGATIVE   Hgb urine dipstick NEGATIVE NEGATIVE   Protein, ur NEGATIVE NEGATIVE   Nitrite NEGATIVE NEGATIVE   Leukocytes,Ua 2+ (A) NEGATIVE   WBC, UA 10-20 (A) 0 - 5 /HPF   RBC / HPF 0-2 0 - 2 /HPF   Squamous Epithelial / LPF 10-20 (A) < OR = 5 /HPF   Bacteria, UA NONE SEEN NONE SEEN /HPF   Calcium Oxalate Crystal MANY (A) NONE OR FE /HPF   Hyaline Cast NONE SEEN NONE SEEN /LPF  TSH     Status: None   Collection Time: 03/08/20  9:55 AM  Result Value Ref Range   TSH 2.34 0.35 - 4.50 uIU/mL  CBC with Differential/Platelet      Status: Abnormal   Collection Time: 03/08/20  9:55 AM  Result Value Ref Range   WBC 4.9 4.0 - 10.5 K/uL   RBC 4.35 3.87 - 5.11 Mil/uL   Hemoglobin 13.5 12.0 - 15.0 g/dL   HCT 41.1 36.0 - 46.0 %   MCV 94.5 78.0 - 100.0 fl   MCHC 32.8 30.0 - 36.0 g/dL   RDW 14.5 11.5 - 15.5 %   Platelets 144.0 (L) 150.0 - 400.0 K/uL   Neutrophils Relative % 54.6 43.0 - 77.0 %   Lymphocytes Relative 30.5 12.0 - 46.0 %   Monocytes Relative 9.8 3.0 - 12.0 %   Eosinophils Relative 4.3 0.0 - 5.0 %   Basophils Relative 0.8 0.0 - 3.0 %   Neutro Abs 2.7 1.4 - 7.7 K/uL   Lymphs Abs 1.5 0.7 - 4.0 K/uL   Monocytes Absolute 0.5 0.1 - 1.0 K/uL   Eosinophils Absolute 0.2 0.0 - 0.7 K/uL   Basophils Absolute 0.0 0.0 - 0.1 K/uL  Comprehensive metabolic panel     Status: Abnormal   Collection Time: 03/08/20  9:55 AM  Result Value Ref Range   Sodium 143 135 - 145 mEq/L   Potassium 4.2 3.5 - 5.1 mEq/L   Chloride 104 96 - 112 mEq/L   CO2 34 (  H) 19 - 32 mEq/L   Glucose, Bld 102 (H) 70 - 99 mg/dL   BUN 17 6 - 23 mg/dL   Creatinine, Ser 1.28 (H) 0.40 - 1.20 mg/dL   Total Bilirubin 0.6 0.2 - 1.2 mg/dL   Alkaline Phosphatase 53 39 - 117 U/L   AST 20 0 - 37 U/L   ALT 11 0 - 35 U/L   Total Protein 6.3 6.0 - 8.3 g/dL   Albumin 3.5 3.5 - 5.2 g/dL   GFR 39.01 (L) >60.00 mL/min   Calcium 8.9 8.4 - 10.5 mg/dL  Lipid panel     Status: None   Collection Time: 03/08/20  9:55 AM  Result Value Ref Range   Cholesterol 121 0 - 200 mg/dL    Comment: ATP III Classification       Desirable:  < 200 mg/dL               Borderline High:  200 - 239 mg/dL          High:  > = 240 mg/dL   Triglycerides 88.0 0.0 - 149.0 mg/dL    Comment: Normal:  <150 mg/dLBorderline High:  150 - 199 mg/dL   HDL 45.70 >39.00 mg/dL   VLDL 17.6 0.0 - 40.0 mg/dL   LDL Cholesterol 58 0 - 99 mg/dL   Total CHOL/HDL Ratio 3     Comment:                Men          Women1/2 Average Risk     3.4          3.3Average Risk          5.0          4.42X Average Risk           9.6          7.13X Average Risk          15.0          11.0                       NonHDL 75.26     Comment: NOTE:  Non-HDL goal should be 30 mg/dL higher than patient's LDL goal (i.e. LDL goal of < 70 mg/dL, would have non-HDL goal of < 100 mg/dL)   Objective  There is no height or weight on file to calculate BMI. Wt Readings from Last 3 Encounters:  01/01/20 188 lb (85.3 kg)  09/30/19 197 lb 6.4 oz (89.5 kg)  07/28/19 190 lb 3.2 oz (86.3 kg)   Temp Readings from Last 3 Encounters:  09/30/19 98.1 F (36.7 C) (Oral)  07/28/19 98.7 F (37.1 C)  07/14/19 98.4 F (36.9 C)   BP Readings from Last 3 Encounters:  09/30/19 126/76  07/28/19 126/75  05/21/19 136/76   Pulse Readings from Last 3 Encounters:  09/30/19 78  07/28/19 76  05/21/19 72    Physical Exam  Assessment  Plan  Type 2 diabetes mellitus without complication, without long-term current use of insulin (HCC) - Plan: atorvastatin (LIPITOR) 40 MG tablet  Gastroesophageal reflux disease - Plan: pantoprazole (PROTONIX) 40 MG tablet  Constipation, unspecified constipation type - Plan: polyethylene glycol powder (GLYCOLAX/MIRALAX) 17 GM/SCOOP powder  Dry eyes - Plan: polyvinyl alcohol (LIQUIFILM TEARS) 1.4 % ophthalmic solution  Provider: Dr. Olivia Mackie McLean-Scocuzza-Internal Medicine

## 2020-04-06 NOTE — Progress Notes (Addendum)
Chief Complaint  Patient presents with  . Follow-up   F/u with caretaker 1. C/o depression and tearful today on remeron 15 mg qhs claims children dont need her any longer and financial issues and her kids having a meeting soon for her birthday  Only thing she can do is knit/sew and ride her bike since she cant hear 2. CKD 3b agreeable to see renal for now disc with daughter Leitha Schuller who is also agreeable  GFR 39 with Cr 1.28 she reports she is trying drink enough water and has increase water intake  3. DM 2 A1C 5.9 03/08/20 on januvia 50 mg qd doing better  4. Sks under breasts c/o itching at times will see derm 7 or 07/2020 Dr. Evorn Gong    Review of Systems  Constitutional: Negative for weight loss.  HENT: Positive for hearing loss.   Eyes: Negative for blurred vision.  Respiratory: Negative for shortness of breath.   Cardiovascular: Positive for leg swelling. Negative for chest pain.  Gastrointestinal: Negative for abdominal pain.  Musculoskeletal: Negative for falls.  Skin: Positive for itching. Negative for rash.  Psychiatric/Behavioral: Positive for depression. Negative for memory loss.   Past Medical History:  Diagnosis Date  . Acoustic neuroma ([place])   . Allergy   . Asthma   . Bilateral swelling of feet    and legs  . Bladder infection   . CAD (coronary artery disease)   . Cataract   . Change in voice   . Compression fracture of body of thoracic vertebra (HCC)    T12 09/18/15 MRI s/p fall   . Constipation   . COPD (chronic obstructive pulmonary disease) ([place])    previous CXR with chronic interstitial lung dz   . CVA (cerebral vascular accident) ([place])   . Depression   . Diabetes ([place])    with neuropathy  . Diabetes mellitus, type 2 ([place])   . Diarrhea   . Double vision   . DVT (deep venous thrombosis) ([place])    right leg 10/2015 was on coumadin off as of 2017/2018 ; s/p IVC filter  . Enuresis   . Eye pain, right   . Fall   . Fatty liver    09/15/15 also mildly  dilated pancreatitic duct rec MRCP small sub cm cyst hemangioma speeln mild right hydronephrorossi and prox. hydroureter, kidney stones, mild scarring kidneys  . Female stress incontinence   . Flank pain   . GERD (gastroesophageal reflux disease)    with small hiatal hernia   . Hard of hearing   . Heart disease   . History of kidney problems   . Hyperlipidemia    mixed  . Hypertension   . Hypothyroidism, postsurgical   . Impaired mobility and ADLs    uses rolling walker has caretaker 24/7 at home  . Leg edema   . Mixed incontinence urge and stress (female)(female)   . Neuropathy   . Osteoarthritis    DDD spine   . Osteoporosis with fracture    T12 compression fracture  . Photophobia   . Pulmonary embolism ([place])    10/2015 off coumadin as of 04/2016  . Pulmonary HTN (HCC)    mild pulm HTN, echo 10/09/15 EF 95-28%UXLKG 1 dd, RV systolic pressure increased   . Recurrent UTI   . Sinus pressure   . Skin cancer    BCC jawline and scalp   . Thyroid disease    follows [facility]  . TIA (transient ischemic attack)  MRI 2009/2010 neg stroke   . Trigeminal neuralgia    Dr. Tomi Bamberger s/p gamma knife x 2, on Tegretol since 2011/2012 no increase in dose >200 mg bid rec per family per neurology   . Urinary frequency   . Urinary, incontinence, stress female    Dr Erlene Quan urology    Past Surgical History:  Procedure Laterality Date  . APPENDECTOMY     as a child, open  . BRAIN SURGERY     schwnnoma removal 1996   . brain tumor surgery    . BREAST SURGERY     breast bx  . CATARACT EXTRACTION    . CHOLECYSTECTOMY    . EYE SURGERY     cataract  . IVC FILTER PLACEMENT ([facility] HX)     Dr. Lucky Cowboy 10/2015   . LAPAROSCOPIC TUBAL LIGATION    . MOHS SURGERY     scalp 04/2014   . PERIPHERAL VASCULAR CATHETERIZATION N/A 10/11/2015   Procedure: IVC Filter Insertion;  Surgeon: Algernon Huxley, MD;  Location: [facility];  Service: Cardiovascular;  Laterality: N/A;  . PERIPHERAL VASCULAR  THROMBECTOMY Bilateral 03/29/2018   Procedure: PERIPHERAL VASCULAR THROMBECTOMY;  Surgeon: Algernon Huxley, MD;  Location: [facility];  Service: Cardiovascular;  Laterality: Bilateral;  . PUBOVAGINAL SLING    . THROAT SURGERY    . THYROID SURGERY     tumor around vocal cords   . TOOTH EXTRACTION     winter 2018   . TOTAL THYROIDECTOMY  1976   Family History  Problem Relation Age of Onset  . Heart disease Mother   . Diabetes Father   . Cancer Daughter        breast ca x 2 s/p mastectomy    Social History   Socioeconomic History  . Marital status: Widowed    Spouse name: Not on file  . Number of children: 5  . Years of education: Not on file  . Highest education level: Not on file  Occupational History  . Occupation: retired  Tobacco Use  . Smoking status: Former Smoker    Packs/day: 0.50    Years: 20.00    Pack years: 10.00    Types: Cigarettes    Quit date: 09/20/1995    Years since quitting: 24.5  . Smokeless tobacco: Never Used  . Tobacco comment: quit 1996 smoked 20 years max 8 cig qd   Substance and Sexual Activity  . Alcohol use: No  . Drug use: No  . Sexual activity: Not on file  Other Topics Concern  . Not on file  Social History Narrative   Lives at home with caretaker currently [name]    Has children    Likes to ride stationary bike and sew             Social Determinants of Health   Financial Resource Strain:   . Difficulty of Paying Living Expenses:   Food Insecurity:   . Worried About Charity fundraiser in the Last Year:   . Arboriculturist in the Last Year:   Transportation Needs:   . Film/video editor (Medical):   Marland Kitchen Lack of Transportation (Non-Medical):   Physical Activity: Sufficiently Active  . Days of Exercise per Week: 4 days  . Minutes of Exercise per Session: 40 min  Stress:   . Feeling of Stress :   Social Connections:   . Frequency of Communication with Friends and Family:   . Frequency of  Social Gatherings with  Friends and Family:   . Attends Religious Services:   . Active Member of Clubs or Organizations:   . Attends Archivist Meetings:   Marland Kitchen Marital Status:   Intimate Partner Violence:   . Fear of Current or Ex-Partner:   . Emotionally Abused:   Marland Kitchen Physically Abused:   . Sexually Abused:    No outpatient medications have been marked as taking for the 04/06/20 encounter (Office Visit) with McLean-Scocuzza, Nino Glow, MD.   Allergies  Allergen Reactions  . Penicillins Shortness Of Breath, Rash and Other (See Comments)    Has patient had a PCN reaction causing immediate rash, facial/tongue/throat swelling, SOB or lightheadedness with hypotension: Yes Has patient had a PCN reaction causing severe rash involving mucus membranes or skin necrosis: No Has patient had a PCN reaction that required hospitalization No Has patient had a PCN reaction occurring within the last 10 years: No If all of the above answers are "NO", then may proceed with Cephalosporin use.  . Sulfa Antibiotics Shortness Of Breath, Rash and Other (See Comments)  . Amitiza [Lubiprostone]     N/v/d  . Aspirin Other (See Comments)    Reaction:  Unknown  Other reaction(s): Bleeding (intolerance) Per patient " causes nose to bleed" Can take 81 mg daily without any complications Other reaction(s): "bloody nose"   . Penicillin G Other (See Comments)    Has patient had a PCN reaction causing immediate rash, facial/tongue/throat swelling, SOB or lightheadedness with hypotension: No Has patient had a PCN reaction causing severe rash involving mucus membranes or skin necrosis: Unknown Has patient had a PCN reaction that required hospitalization: Unknown Has patient had a PCN reaction occurring within the last 10 years: Unknown If all of the above answers are "NO", then may proceed with Cephalosporin use.   Recent Results (from the past 2160 hour(s))  Hemoglobin A1c     Status: None   Collection Time: 03/08/20  9:55 AM  Result  Value Ref Range   Hgb A1c MFr Bld 5.9 4.6 - 6.5 %    Comment: Glycemic Control Guidelines for People with Diabetes:Non Diabetic:  <6%Goal of Therapy: <7%Additional Action Suggested:  >8%   Urinalysis, Routine w reflex microscopic     Status: Abnormal   Collection Time: 03/08/20  9:55 AM  Result Value Ref Range   Color, Urine YELLOW YELLOW   APPearance CLOUDY (A) CLEAR   Specific Gravity, Urine 1.022 1.001 - 1.03   pH 7.0 5.0 - 8.0   Glucose, UA NEGATIVE NEGATIVE   Bilirubin Urine NEGATIVE NEGATIVE   Ketones, ur TRACE (A) NEGATIVE   Hgb urine dipstick NEGATIVE NEGATIVE   Protein, ur NEGATIVE NEGATIVE   Nitrite NEGATIVE NEGATIVE   Leukocytes,Ua 2+ (A) NEGATIVE   WBC, UA 10-20 (A) 0 - 5 /HPF   RBC / HPF 0-2 0 - 2 /HPF   Squamous Epithelial / LPF 10-20 (A) < OR = 5 /HPF   Bacteria, UA NONE SEEN NONE SEEN /HPF   Calcium Oxalate Crystal MANY (A) NONE OR FE /HPF   Hyaline Cast NONE SEEN NONE SEEN /LPF  TSH     Status: None   Collection Time: 03/08/20  9:55 AM  Result Value Ref Range   TSH 2.34 0.35 - 4.50 uIU/mL  CBC with Differential/Platelet     Status: Abnormal   Collection Time: 03/08/20  9:55 AM  Result Value Ref Range   WBC 4.9 4.0 - 10.5 K/uL   RBC 4.35  3.87 - 5.11 Mil/uL   Hemoglobin 13.5 12.0 - 15.0 g/dL   HCT 41.1 36.0 - 46.0 %   MCV 94.5 78.0 - 100.0 fl   MCHC 32.8 30.0 - 36.0 g/dL   RDW 14.5 11.5 - 15.5 %   Platelets 144.0 (L) 150.0 - 400.0 K/uL   Neutrophils Relative % 54.6 43.0 - 77.0 %   Lymphocytes Relative 30.5 12.0 - 46.0 %   Monocytes Relative 9.8 3.0 - 12.0 %   Eosinophils Relative 4.3 0.0 - 5.0 %   Basophils Relative 0.8 0.0 - 3.0 %   Neutro Abs 2.7 1.4 - 7.7 K/uL   Lymphs Abs 1.5 0.7 - 4.0 K/uL   Monocytes Absolute 0.5 0.1 - 1.0 K/uL   Eosinophils Absolute 0.2 0.0 - 0.7 K/uL   Basophils Absolute 0.0 0.0 - 0.1 K/uL  Comprehensive metabolic panel     Status: Abnormal   Collection Time: 03/08/20  9:55 AM  Result Value Ref Range   Sodium 143 135 - 145  mEq/L   Potassium 4.2 3.5 - 5.1 mEq/L   Chloride 104 96 - 112 mEq/L   CO2 34 (H) 19 - 32 mEq/L   Glucose, Bld 102 (H) 70 - 99 mg/dL   BUN 17 6 - 23 mg/dL   Creatinine, Ser 1.28 (H) 0.40 - 1.20 mg/dL   Total Bilirubin 0.6 0.2 - 1.2 mg/dL   Alkaline Phosphatase 53 39 - 117 U/L   AST 20 0 - 37 U/L   ALT 11 0 - 35 U/L   Total Protein 6.3 6.0 - 8.3 g/dL   Albumin 3.5 3.5 - 5.2 g/dL   GFR 39.01 (L) >60.00 mL/min   Calcium 8.9 8.4 - 10.5 mg/dL  Lipid panel     Status: None   Collection Time: 03/08/20  9:55 AM  Result Value Ref Range   Cholesterol 121 0 - 200 mg/dL    Comment: ATP III Classification       Desirable:  < 200 mg/dL               Borderline High:  200 - 239 mg/dL          High:  > = 240 mg/dL   Triglycerides 88.0 0.0 - 149.0 mg/dL    Comment: Normal:  <150 mg/dLBorderline High:  150 - 199 mg/dL   HDL 45.70 >39.00 mg/dL   VLDL 17.6 0.0 - 40.0 mg/dL   LDL Cholesterol 58 0 - 99 mg/dL   Total CHOL/HDL Ratio 3     Comment:                Men          Women1/2 Average Risk     3.4          3.3Average Risk          5.0          4.42X Average Risk          9.6          7.13X Average Risk          15.0          11.0                       NonHDL 75.26     Comment: NOTE:  Non-HDL goal should be 30 mg/dL higher than patient's LDL goal (i.e. LDL goal of < 70 mg/dL, would have non-HDL goal of < 100 mg/dL)  Objective  Body mass index is 30.92 kg/m. Wt Readings from Last 3 Encounters:  04/06/20 197 lb 6.4 oz (89.5 kg)  01/01/20 188 lb (85.3 kg)  09/30/19 197 lb 6.4 oz (89.5 kg)   Temp Readings from Last 3 Encounters:  04/06/20 (!) 97.3 F (36.3 C) (Temporal)  09/30/19 98.1 F (36.7 C) (Oral)  07/28/19 98.7 F (37.1 C)   BP Readings from Last 3 Encounters:  04/06/20 118/76  09/30/19 126/76  07/28/19 126/75   Pulse Readings from Last 3 Encounters:  04/06/20 73  09/30/19 78  07/28/19 76    Physical Exam Vitals and nursing note reviewed.  Constitutional:      Appearance:  Normal appearance. She is well-developed and well-groomed. She is obese.  HENT:     Head: Normocephalic and atraumatic.     Comments: HOH having to write out conversation   Eyes:     Conjunctiva/sclera: Conjunctivae normal.     Pupils: Pupils are equal, round, and reactive to light.  Cardiovascular:     Rate and Rhythm: Normal rate and regular rhythm.     Heart sounds: Normal heart sounds. No murmur.     Comments: 1+ edema b/l legs  Pulmonary:     Effort: Pulmonary effort is normal.     Breath sounds: Normal breath sounds.  Skin:    General: Skin is warm and dry.  Neurological:     General: No focal deficit present.     Mental Status: She is alert and oriented to person, place, and time. Mental status is at baseline.     Comments: Crying and tearful on exam today Using rollator today  Psychiatric:        Attention and Perception: Attention and perception normal.        Mood and Affect: Mood and affect normal.        Speech: Speech normal.        Behavior: Behavior normal. Behavior is cooperative.        Thought Content: Thought content normal.        Cognition and Memory: Cognition and memory normal.        Judgment: Judgment normal.     Assessment  Plan  Chronic kidney disease, stage 3b with progression h/o DM2 controlled 03/08/20 A1C 5.9 - Plan: Ambulatory referral to Nephrology, Basic Metabolic Panel (BMET) Avoid nsaids  Water 55-64 ounces daily   Eczema, vs ISK under breasts- Plan: hydrocortisone 2.5 % cream F/iu Dr. Evorn Gong  Thrombocytopenia [facility]) - Plan: CBC with Differential  Seborrheic keratoses  Depression, recurrent ([place]), worsening Cont remeron 15 mg qhs she has transient episodes of crying   HM Flu10/27/20 utd prevnar had 09/20/16 Tdap 08/04/16 shingrixnot had out of stock called pharmacy,? zostavaxif had MMR immune covid 2nd 2/2   Est dermatologywill see 7 or 07/2020 Dr. Evorn Gong Out of age window pap, colonoscopy, mammo DEXA 01/01/08  osteopeniawill make sure on ca 600 bid and vit D 1000 iu qd -Vitamin d 03/15/18 48.1 -disc with patient and family at f/u if want to pursuein future DEXA   Provider: Dr. Olivia Mackie McLean-Scocuzza-Internal Medicine

## 2020-04-06 NOTE — Addendum Note (Signed)
Addended by: Tor Netters I on: 04/06/2020 12:22 PM   Modules accepted: Orders

## 2020-04-06 NOTE — Patient Instructions (Signed)
Seborrheic Keratosis °A seborrheic keratosis is a common, noncancerous (benign) skin growth. These growths are velvety, waxy, rough, tan, brown, or black spots that appear on the skin. These skin growths can be flat or raised, and scaly. °What are the causes? °The cause of this condition is not known. °What increases the risk? °You are more likely to develop this condition if you: °· Have a family history of seborrheic keratosis. °· Are 50 or older. °· Are pregnant. °· Have had estrogen replacement therapy. °What are the signs or symptoms? °Symptoms of this condition include growths on the face, chest, shoulders, back, or other areas. These growths: °· Are usually painless, but may become irritated and itchy. °· Can be yellow, brown, black, or other colors. °· Are slightly raised or have a flat surface. °· Are sometimes rough or wart-like in texture. °· Are often velvety or waxy on the surface. °· Are round or oval-shaped. °· Often occur in groups, but may occur as a single growth. °How is this diagnosed? °This condition is diagnosed with a medical history and physical exam. °· A sample of the growth may be tested (skin biopsy). °· You may need to see a skin specialist (dermatologist). °How is this treated? °Treatment is not usually needed for this condition, unless the growths are irritated or bleed often. °· You may also choose to have the growths removed if you do not like their appearance. °? Most commonly, these growths are treated with a procedure in which liquid nitrogen is applied to "freeze" off the growth (cryosurgery). °? They may also be burned off with electricity (electrocautery) or removed by scraping (curettage). °Follow these instructions at home: °· Watch your growth for any changes. °· Keep all follow-up visits as told by your health care provider. This is important. °· Do not scratch or pick at the growth or growths. This can cause them to become irritated or infected. °Contact a health care  provider if: °· You suddenly have many new growths. °· Your growth bleeds, itches, or hurts. °· Your growth suddenly becomes larger or changes color. °Summary °· A seborrheic keratosis is a common, noncancerous (benign) skin growth. °· Treatment is not usually needed for this condition, unless the growths are irritated or bleed often. °· Watch your growth for any changes. °· Contact a health care provider if you suddenly have many new growths or your growth suddenly becomes larger or changes color. °· Keep all follow-up visits as told by your health care provider. This is important. °This information is not intended to replace advice given to you by your health care provider. Make sure you discuss any questions you have with your health care provider. °Document Revised: 04/04/2018 Document Reviewed: 04/04/2018 °Elsevier Patient Education © 2020 [employer] ° °

## 2020-04-07 NOTE — Addendum Note (Signed)
Addended by: Orland Mustard on: 04/07/2020 04:47 PM   Modules accepted: Level of Service

## 2020-04-12 ENCOUNTER — Other Ambulatory Visit: Payer: Self-pay

## 2020-04-12 ENCOUNTER — Ambulatory Visit (INDEPENDENT_AMBULATORY_CARE_PROVIDER_SITE_OTHER): Payer: Medicare PPO | Admitting: Podiatry

## 2020-04-12 ENCOUNTER — Encounter: Payer: Self-pay | Admitting: Podiatry

## 2020-04-12 DIAGNOSIS — D689 Coagulation defect, unspecified: Secondary | ICD-10-CM | POA: Diagnosis not present

## 2020-04-12 DIAGNOSIS — E1140 Type 2 diabetes mellitus with diabetic neuropathy, unspecified: Secondary | ICD-10-CM | POA: Diagnosis not present

## 2020-04-12 DIAGNOSIS — B351 Tinea unguium: Secondary | ICD-10-CM

## 2020-04-12 DIAGNOSIS — M79676 Pain in unspecified toe(s): Secondary | ICD-10-CM | POA: Diagnosis not present

## 2020-04-12 NOTE — Progress Notes (Signed)
She presents today with her aide to have her nails trimmed because her painfully long.  Objective: Vital signs are stable she is alert and oriented x3.  Pulses are palpable.  There is no erythema edema cellulitis drainage or odor her toenails are long thick yellow dystrophic-like mycotic sharply incurvated painful palpation as well as debridement.  Assessment: Pain in limb secondary to onychomycosis.  Plan: Debridement of toenails 1 through 5 bilateral

## 2020-04-14 ENCOUNTER — Telehealth: Payer: Self-pay | Admitting: Internal Medicine

## 2020-04-14 ENCOUNTER — Other Ambulatory Visit: Payer: Self-pay | Admitting: Internal Medicine

## 2020-04-14 DIAGNOSIS — G500 Trigeminal neuralgia: Secondary | ICD-10-CM

## 2020-04-14 MED ORDER — ACETAMINOPHEN-CODEINE #3 300-30 MG PO TABS: 1.0000 | ORAL_TABLET | Freq: Two times a day (BID) | ORAL | 0 refills | Status: DC | PRN

## 2020-04-14 NOTE — Telephone Encounter (Signed)
Sent tylenol #3 if sx's continue call [facility] for recs  [place]

## 2020-04-14 NOTE — Telephone Encounter (Signed)
Please advise 

## 2020-04-14 NOTE — Telephone Encounter (Signed)
Pt daughter called wanting to inform Dr. Olivia Mackie that her mother had dental work done last week  And is having breakthrough pain with the Trigeminal neuralgia

## 2020-04-15 NOTE — Telephone Encounter (Signed)
Called and informed Patient caregiver Jessica Erickson of the below. She states that the Patient was placed on 2 extra gabapentin a day and this has helped so far.   They will contact Neurology if anything further is needed.

## 2020-05-05 DIAGNOSIS — N1832 Chronic kidney disease, stage 3b: Secondary | ICD-10-CM | POA: Diagnosis not present

## 2020-05-05 DIAGNOSIS — N390 Urinary tract infection, site not specified: Secondary | ICD-10-CM | POA: Diagnosis not present

## 2020-05-05 DIAGNOSIS — I10 Essential (primary) hypertension: Secondary | ICD-10-CM | POA: Diagnosis not present

## 2020-05-05 DIAGNOSIS — E1122 Type 2 diabetes mellitus with diabetic chronic kidney disease: Secondary | ICD-10-CM | POA: Diagnosis not present

## 2020-05-12 ENCOUNTER — Other Ambulatory Visit: Payer: Self-pay | Admitting: Internal Medicine

## 2020-05-12 DIAGNOSIS — J449 Chronic obstructive pulmonary disease, unspecified: Secondary | ICD-10-CM

## 2020-05-12 MED ORDER — TIOTROPIUM BROMIDE MONOHYDRATE 18 MCG IN CAPS: 18.0000 ug | ORAL_CAPSULE | Freq: Every day | RESPIRATORY_TRACT | 3 refills | Status: DC

## 2020-06-16 DIAGNOSIS — N1832 Chronic kidney disease, stage 3b: Secondary | ICD-10-CM | POA: Diagnosis not present

## 2020-06-16 DIAGNOSIS — E1122 Type 2 diabetes mellitus with diabetic chronic kidney disease: Secondary | ICD-10-CM | POA: Diagnosis not present

## 2020-06-16 DIAGNOSIS — R8290 Unspecified abnormal findings in urine: Secondary | ICD-10-CM | POA: Diagnosis not present

## 2020-06-16 DIAGNOSIS — I10 Essential (primary) hypertension: Secondary | ICD-10-CM | POA: Diagnosis not present

## 2020-07-01 ENCOUNTER — Telehealth: Payer: Self-pay | Admitting: Internal Medicine

## 2020-07-01 NOTE — Telephone Encounter (Signed)
Pt's daughter would like a call back. She wants to address her mom needs an FL2 filled out. She wants to check correct dosage for furosemide prescription. She also wants to know if Dr. Olivia Mackie has seen her nephrology lab report?

## 2020-07-01 NOTE — Telephone Encounter (Signed)
Please advise on FL2 and report

## 2020-07-01 NOTE — Telephone Encounter (Signed)
Fl2 they will need to get from the facility where she is going or we may have one here but prefer she brings one in from facility of choice Lasix 20 mg 1-2 x per day Yes I can see kidney labs and they are following as well   Sch appt to fill out FL-2    [place]

## 2020-07-02 NOTE — Telephone Encounter (Signed)
States that patient receives 24 hour caregivers and will qualify for assistance through the [place] if she has an FL2 form filled out. States we will need to use the one we have in office.   Patient is taking the Lasix 40 mg every morning and daughter states Patient's feet are very swollen.   Patient scheduled to come in 07/06/20 for FL2 and foot swelling.

## 2020-07-06 ENCOUNTER — Encounter: Payer: Self-pay | Admitting: Internal Medicine

## 2020-07-06 ENCOUNTER — Ambulatory Visit: Payer: Medicare PPO | Admitting: Internal Medicine

## 2020-07-06 ENCOUNTER — Other Ambulatory Visit: Payer: Self-pay

## 2020-07-06 ENCOUNTER — Telehealth: Payer: Self-pay | Admitting: Internal Medicine

## 2020-07-06 VITALS — BP 118/84 | HR 78 | Temp 98.0°F | Ht 67.0 in | Wt 195.4 lb

## 2020-07-06 DIAGNOSIS — J449 Chronic obstructive pulmonary disease, unspecified: Secondary | ICD-10-CM

## 2020-07-06 DIAGNOSIS — I890 Lymphedema, not elsewhere classified: Secondary | ICD-10-CM

## 2020-07-06 DIAGNOSIS — I152 Hypertension secondary to endocrine disorders: Secondary | ICD-10-CM

## 2020-07-06 DIAGNOSIS — F419 Anxiety disorder, unspecified: Secondary | ICD-10-CM

## 2020-07-06 DIAGNOSIS — G4700 Insomnia, unspecified: Secondary | ICD-10-CM

## 2020-07-06 DIAGNOSIS — I10 Essential (primary) hypertension: Secondary | ICD-10-CM

## 2020-07-06 DIAGNOSIS — F32A Depression, unspecified: Secondary | ICD-10-CM

## 2020-07-06 DIAGNOSIS — N1832 Chronic kidney disease, stage 3b: Secondary | ICD-10-CM | POA: Diagnosis not present

## 2020-07-06 DIAGNOSIS — R600 Localized edema: Secondary | ICD-10-CM | POA: Diagnosis not present

## 2020-07-06 DIAGNOSIS — K921 Melena: Secondary | ICD-10-CM | POA: Diagnosis not present

## 2020-07-06 DIAGNOSIS — R319 Hematuria, unspecified: Secondary | ICD-10-CM

## 2020-07-06 DIAGNOSIS — F329 Major depressive disorder, single episode, unspecified: Secondary | ICD-10-CM

## 2020-07-06 DIAGNOSIS — E1159 Type 2 diabetes mellitus with other circulatory complications: Secondary | ICD-10-CM | POA: Diagnosis not present

## 2020-07-06 MED ORDER — MIRTAZAPINE 15 MG PO TABS: 15.0000 mg | ORAL_TABLET | Freq: Every day | ORAL | 3 refills | Status: DC

## 2020-07-06 MED ORDER — FUROSEMIDE 40 MG PO TABS: 40.0000 mg | ORAL_TABLET | Freq: Every day | ORAL | 3 refills | Status: DC

## 2020-07-06 NOTE — Patient Instructions (Addendum)
Increase lasix 40 mg daily to 2x per day 40 mg 2x per day x 5 days  Use trelegy 1 puff daily and let me know if you like this hold spiriva and symbicort rinse your mouth  Ultrasound kidneys and heart  Labs today  No blood in stool today  Refer to vascular for leg swelling    Food Basics for Chronic Kidney Disease When your kidneys are not working well, they cannot remove waste and excess substances from your blood as effectively as they did before. This can lead to a buildup and imbalance of these substances, which can worsen kidney damage and affect how your body functions. Certain foods lead to a buildup of these substances in the body. By changing your diet as recommended by your diet and nutrition specialist (dietitian) or health care provider, you could help prevent further kidney damage and delay or prevent the need for dialysis. What are tips for following this plan? General instructions   Work with your health care provider and dietitian to develop a meal plan that is right for you. Foods you can eat, limit, or avoid will be different for each person depending on the stage of kidney disease and any other existing health conditions.  Talk with your health care provider about whether you should take a vitamin and mineral supplement.  Use standard measuring cups and spoons to measure servings of foods. Use a kitchen scale to measure portions of protein foods.  If directed by your health care provider, avoid drinking too much fluid. Measure and count all liquids, including water, ice, soups, flavored gelatin, and frozen desserts such as popsicles or ice cream. Reading food labels  Check the amount of sodium in foods. Choose foods that have less than 300 milligrams (mg) per serving.  Check the ingredient list for phosphorus or potassium-based additives or preservatives.  Check the amount of saturated and trans fat. Limit or avoid these fats as told by your dietitian. Shopping  Avoid  buying foods that are: ? Processed, frozen, or prepackaged. ? Calcium-enriched or fortified.  Do not buy foods that have salt or sodium listed among the first five ingredients.  Do not buy canned vegetables. Cooking  Replace animal proteins, such as meat, fish, eggs, or dairy, with plant proteins from beans, nuts, and soy. ? Use soy milk instead of cow's milk. ? Add beans or tofu to soups, casseroles, or pasta dishes instead of meat.  Soak vegetables, such as potatoes, before cooking to reduce potassium. To do this: ? Peel and cut into small pieces. ? Soak in warm water for at least 2 hours. For every 1 cup of vegetables, use 10 cups of water. ? Drain and rinse with warm water. ? Boil for at least 5 minutes. Meal planning  Limit the amount of protein from plant and animal sources you eat each day.  Do not add salt to food when cooking or before eating.  Eat meals and snacks at around the same time each day. If you have diabetes:  If you have diabetes (diabetes mellitus) and chronic kidney disease, it is important to keep your blood glucose in the target range recommended by your health care provider. Follow your diabetes management plan. This may include: ? Checking your blood glucose regularly. ? Taking oral medicines, insulin, or both. ? Exercising for at least 30 minutes on 5 or more days each week, or as told by your health care provider. ? Tracking how many servings of carbohydrates you eat  at each meal.  You may be given specific guidelines on how much of certain foods and nutrients you may eat, depending on your stage of kidney disease and whether you have high blood pressure (hypertension). Follow your meal plan as told by your dietitian. What nutrients should be limited? The items listed are not a complete list. Talk with your dietitian about what dietary choices are best for you. Potassium Potassium affects how steadily your heart beats. If too much potassium builds up  in your blood, it can cause an irregular heartbeat or even a heart attack. You may need to eat less potassium, depending on your blood potassium levels and the stage of kidney disease. Talk to your dietitian about how much potassium you may have each day. You may need to limit or avoid foods that are high in potassium, such as:  Milk and soy milk.  Fruits, such as bananas, papaya, apricots, nectarines, melon, prunes, raisins, kiwi, and oranges.  Vegetables, such as potatoes, sweet potatoes, yams, tomatoes, leafy greens, beets, okra, avocado, pumpkin, and winter squash.  White and lima beans. Phosphorus Phosphorus is a mineral found in your bones. A balance between calcium and phosphorous is needed to build and maintain healthy bones. Too much phosphorus pulls calcium from your bones. This can make your bones weak and more likely to break. Too much phosphorus can also make your skin itch. You may need to eat less phosphorus depending on your blood phosphorus levels and the stage of kidney disease. Talk to your dietitian about how much potassium you may have each day. You may need to take medicine to lower your blood phosphorus levels if diet changes do not help. You may need to limit or avoid foods that are high in phosphorus, such as:  Milk and dairy products.  Dried beans and peas.  Tofu, soy milk, and other soy-based meat replacements.  Colas.  Nuts and peanut butter.  Meat, poultry, and fish.  Bran cereals and oatmeals. Protein Protein helps you to make and keep muscle. It also helps in the repair of your body's cells and tissues. One of the natural breakdown products of protein is a waste product called urea. When your kidneys are not working properly, they cannot remove wastes, such as urea, like they did before you developed chronic kidney disease. Reducing how much protein you eat can help prevent a buildup of urea in your blood. Depending on your stage of kidney disease, you  may need to limit foods that are high in protein. Sources of animal protein include:  Meat (all types).  Fish and seafood.  Poultry.  Eggs.  Dairy. Other protein foods include:  Beans and legumes.  Nuts and nut butter.  Soy and tofu. Sodium Sodium, which is found in salt, helps maintain a healthy balance of fluids in your body. Too much sodium can increase your blood pressure and have a negative effect on the function of your heart and lungs. Too much sodium can also cause your body to retain too much fluid, making your kidneys work harder. Most people should have less than 2,300 milligrams (mg) of sodium each day. If you have hypertension, you may need to limit your sodium to 1,500 mg each day. Talk to your dietitian about how much sodium you may have each day. You may need to limit or avoid foods that are high in sodium, such as:  Salt seasonings.  Soy sauce.  Cured and processed meats.  Salted crackers and snack foods.  Fast food.  Canned soups and most canned foods.  Pickled foods.  Vegetable juice.  Boxed mixes or ready-to-eat boxed meals and side dishes.  Bottled dressings, sauces, and marinades. Summary  Chronic kidney disease can lead to a buildup and imbalance of waste and excess substances in the body. Certain foods lead to a buildup of these substances. By adjusting your intake of these foods, you could help prevent more kidney damage and delay or prevent the need for dialysis.  Food adjustments are different for each person with chronic kidney disease. Work with a dietitian to set up nutrient goals and a meal plan that is right for you.  If you have diabetes and chronic kidney disease, it is important to keep your blood glucose in the target range recommended by your health care provider. This information is not intended to replace advice given to you by your health care provider. Make sure you discuss any questions you have with your health care  provider. Document Revised: 03/13/2019 Document Reviewed: 11/15/2016 Elsevier Patient Education  [place].  Chronic Kidney Disease, Adult Chronic kidney disease (CKD) occurs when the kidneys become damaged slowly over a long period of time. The kidneys are a pair of organs that do many important jobs in the body, including:  Removing waste and extra fluid from the blood to make urine.  Making hormones that maintain the amount of fluid in tissues and blood vessels.  Maintaining the right amount of fluids and chemicals in the body. A small amount of kidney damage may not cause problems, but a large amount of damage may make it hard or impossible for the kidneys to work the way they should. If steps are not taken to slow down kidney damage or to stop it from getting worse, the kidneys may stop working permanently (end-stage renal disease or ESRD). Most of the time, CKD does not go away, but it can often be controlled. People who have CKD are usually able to live normal lives. What are the causes? The most common causes of this condition are diabetes and high blood pressure (hypertension). Other causes include:  Heart and blood vessel (cardiovascular) disease.  Kidney diseases, such as: ? Glomerulonephritis. ? Interstitial nephritis. ? Polycystic kidney disease. ? Renal vascular disease.  Diseases that affect the immune system.  Genetic diseases.  Medicines that damage the kidneys, such as anti-inflammatory medicines.  Being around or being in contact with poisonous (toxic) substances.  A kidney or urinary infection that occurs again and again (recurs).  Vasculitis. This is swelling or inflammation of the blood vessels.  A problem with urine flow that may be caused by: ? Cancer. ? Having kidney stones more than one time. ? An enlarged prostate, in males. What increases the risk? You are more likely to develop this condition if you:  Are older than age 7.  Are  female.  Are African-American, Hispanic, Asian, [place], or American [place].  Are a current or former smoker.  Are obese.  Have a family history of kidney disease or failure.  Often take medicines that are damaging to the kidneys. What are the signs or symptoms? Symptoms of this condition include:  Swelling (edema) of the face, legs, ankles, or feet.  Tiredness (lethargy) and having less energy.  Nausea or vomiting.  Confusion or trouble concentrating.  Problems with urination, such as: ? Painful or burning feeling during urination. ? Decreased urine production. ? Frequent urination, especially at night. ? Bloody urine.  Muscle twitches  and cramps, especially in the legs.  Shortness of breath.  Weakness.  Loss of appetite.  Metallic taste in the mouth.  Trouble sleeping.  Dry, itchy skin.  A low blood count (anemia).  Pale lining of the eyelids and surface of the eye (conjunctiva). Symptoms develop slowly and may not be obvious until the kidney damage becomes severe. It is possible to have kidney disease for years without having any symptoms. How is this diagnosed? This condition may be diagnosed based on:  Blood tests.  Urine tests.  Imaging tests, such as an ultrasound or CT scan.  A test in which a sample of tissue is removed from the kidneys to be examined under a microscope (kidney biopsy). These test results will help your health care provider determine how serious the CKD is. How is this treated? There is no cure for most cases of this condition, but treatment usually relieves symptoms and prevents or slows the progression of the disease. Treatment may include:  Making diet changes, which may require you to avoid alcohol, salty foods (sodium), and foods that are high in potassium, calcium, and protein.  Medicines: ? To lower blood pressure. ? To control blood glucose. ? To relieve anemia. ? To relieve swelling. ? To protect your  bones. ? To improve the balance of electrolytes in your blood.  Removing toxic waste from the body through types of dialysis, if the kidneys can no longer do their job (kidney failure).  Managing any other conditions that are causing your CKD or making it worse. Follow these instructions at home: Medicines  Take over-the-counter and prescription medicines only as told by your health care provider. The dose of some medicines that you take may need to be adjusted.  Do not take any new medicines unless approved by your health care provider. Many medicines can worsen your kidney damage.  Do not take any vitamin and mineral supplements unless approved by your health care provider. Many nutritional supplements can worsen your kidney damage. General instructions  Follow your prescribed diet as told by your health care provider.  Do not use any products that contain nicotine or tobacco, such as cigarettes and e-cigarettes. If you need help quitting, ask your health care provider.  Monitor and track your blood pressure at home. Report changes in your blood pressure as told by your health care provider.  If you are being treated for diabetes, monitor and track your blood sugar (blood glucose) levels as told by your health care provider.  Maintain a healthy weight. If you need help with this, ask your health care provider.  Start or continue an exercise plan. Exercise at least 30 minutes a day, 5 days a week.  Keep your immunizations up to date as told by your health care provider.  Keep all follow-up visits as told by your health care provider. This is important. Where to find more information  American Association of Kidney Patients: BombTimer.gl  National Kidney Foundation: www.kidney.[place]: https://mathis.com/  [facility]: www.lifeoptions.org and www.kidneyschool.org Contact a health care provider if:  Your symptoms get worse.  You develop  new symptoms. Get help right away if:  You develop symptoms of ESRD, which include: ? Headaches. ? Numbness in the hands or feet. ? Easy bruising. ? Frequent hiccups. ? Chest pain. ? Shortness of breath. ? Lack of menstruation, in women.  You have a fever.  You have decreased urine production.  You have pain or bleeding when you urinate.  Summary  Chronic kidney disease (CKD) occurs when the kidneys become damaged slowly over a long period of time.  The most common causes of this condition are diabetes and high blood pressure (hypertension).  There is no cure for most cases of this condition, but treatment usually relieves symptoms and prevents or slows the progression of the disease. Treatment may include a combination of medicines and lifestyle changes. This information is not intended to replace advice given to you by your health care provider. Make sure you discuss any questions you have with your health care provider. Document Revised: 11/02/2017 Document Reviewed: 12/28/2016 Elsevier Patient Education  2020 [place].  Lymphedema  Lymphedema is swelling that is caused by the abnormal collection of lymph in the tissues under the skin. Lymph is fluid from the tissues in your body that is removed through the lymphatic system. This system is part of your body's defense system (immune system) and includes lymph nodes and lymph vessels. The lymph vessels collect and carry the excess fluid, fats, proteins, and wastes from the tissues of the body to the bloodstream. This system also works to clean and remove bacteria and waste products from the body. Lymphedema occurs when the lymphatic system is blocked. When the lymph vessels or lymph nodes are blocked or damaged, lymph does not drain properly. This causes an abnormal buildup of lymph, which leads to swelling in the affected area. This may include the trunk area, or an arm or leg. Lymphedema cannot be cured by medicines, but various  methods can be used to help reduce the swelling. There are two types of lymphedema: primary lymphedema and secondary lymphedema. What are the causes? The cause of this condition depends on the type of lymphedema that you have.  Primary lymphedema is caused by the absence of lymph vessels or having abnormal lymph vessels at birth.  Secondary lymphedema occurs when lymph vessels are blocked or damaged. Secondary lymphedema is more common. Common causes of lymph vessel blockage include: ? Skin infection, such as cellulitis. ? Infection by parasites (filariasis). ? Injury. ? Radiation therapy. ? Cancer. ? Formation of scar tissue. ? Surgery. What are the signs or symptoms? Symptoms of this condition include:  Swelling of the arm or leg.  A heavy or tight feeling in the arm or leg.  Swelling of the feet, toes, or fingers. Shoes or rings may fit more tightly than before.  Redness of the skin over the affected area.  Limited movement of the affected limb.  Sensitivity to touch or discomfort in the affected limb. How is this diagnosed? This condition may be diagnosed based on:  Your symptoms and medical history.  A physical exam.  Bioimpedance spectroscopy. In this test, painless electrical currents are used to measure fluid levels in your body.  Imaging tests, such as: ? Lymphoscintigraphy. In this test, a low dose of a radioactive substance is injected to trace the flow of lymph through the lymph vessels. ? MRI. ? CT scan. ? Duplex ultrasound. This test uses sound waves to produce images of the vessels and the blood flow on a screen. ? Lymphangiography. In this test, a contrast dye is injected into the lymph vessel to help show blockages. How is this treated? Treatment for this condition may depend on the cause of your lymphedema. Treatment may include:  Complete decongestive therapy (CDT). This is done by a certified lymphedema therapist to reduce fluid congestion. This  therapy includes: ? Manual lymph drainage. This is a special massage technique  that promotes lymph drainage out of a limb. ? Skin care. ? Compression wrapping of the affected area. ? Specific exercises. Certain exercises can help fluid move out of the affected limb.  Compression. Various methods may be used to apply pressure to the affected limb to reduce the swelling. They include: ? Wearing compression stockings or sleeves on the affected limb. ? Wrapping the affected limb with special bandages.  Surgery. This is usually done for severe cases only. For example, surgery may be done if you have trouble moving the limb or if the swelling does not get better with other treatments. If an underlying condition is causing the lymphedema, treatment for that condition will be done. For example, antibiotic medicines may be used to treat an infection. Follow these instructions at home: Self-care  The affected area is more likely to become injured or infected. Take these steps to help prevent infection: ? Keep the affected area clean and dry. ? Use approved creams or lotions to keep the skin moisturized. ? Protect your skin from cuts:  Use gloves while cooking or gardening.  Do not walk barefoot.  If you shave the affected area, use an Copy.  Do not wear tight clothes, shoes, or jewelry.  Eat a healthy diet that includes a lot of fruits and vegetables. Activity  Exercise regularly as directed by your health care provider.  Do not sit with your legs crossed.  When possible, keep the affected limb raised (elevated) above the level of your heart.  Avoid carrying things with an arm that is affected by lymphedema. General instructions  Wear compression stockings or sleeves as told by your health care provider.  Note any changes in size of the affected limb. You may be instructed to take regular measurements and keep track of them.  Take over-the-counter and prescription  medicines only as told by your health care provider.  If you were prescribed an antibiotic medicine, take or apply it as told by your health care provider. Do not stop using the antibiotic even if you start to feel better.  Do not use heating pads or ice packs over the affected area.  Avoid having blood draws, IV insertions, or blood pressure checked on the affected limb.  Keep all follow-up visits as told by your health care provider. This is important. Contact a health care provider if you:  Continue to have swelling in your limb.  Have a cut that does not heal.  Have redness or pain in the affected area. Get help right away if you:  Have new swelling in your limb that comes on suddenly.  Develop purplish spots, rash or sores (lesions) on your affected limb.  Have shortness of breath.  Have a fever or chills. Summary  Lymphedema is swelling that is caused by the abnormal collection of lymph in the tissues under the skin.  Lymph is fluid from the tissues in your body that is removed through the lymphatic system. This system collects and carries excess fluid, fats, proteins, and wastes from the tissues of the body to the bloodstream.  Lymphedema causes swelling, pain, and redness in the affected area. This may include the trunk area, or an arm or leg.  Treatment for this condition may depend on the cause of your lymphedema. Treatment may include complete decongestive therapy (CDT), compression methods, surgery, or treating the underlying cause. This information is not intended to replace advice given to you by your health care provider. Make sure you discuss any questions  you have with your health care provider. Document Revised: 12/03/2017 Document Reviewed: 12/03/2017 Elsevier Patient Education  2020 [employer].

## 2020-07-06 NOTE — Telephone Encounter (Signed)
Patient would like a refill on her mirtazapine (REMERON) 15 MG tablet.

## 2020-07-06 NOTE — Progress Notes (Addendum)
Chief Complaint  Patient presents with  . Form Completion  . Foot Swelling   F/u with daughter becky  1. They need forms filled out for military/[employer] benefits so she can get more assistance at home  2. Worsening leg swelling h/o DVT b/l legs on eliquis 2.5 mg bid likely due to lymphedema will check heart enzyme and refer back vascular. On lasix 40 mg qd and no improvement  Echo 10/09/15 grade 1 DD  3. COPD per daughter not using inhalers spiriva, flutter valve, neb tx albuterol/duoneb, symbicort 160-.8.5 2 puff bid though pt tells me using today  4. C/o red blood on toilet paper Sunday and held eliquis dose Sunday prior to visit  5. CKD 3B established with renal will do renal US to further w/u last creatinine 1.28 GFR 36  6. Anxiety and depression PHQ 9 socre 10 and GAD 7 10 on remeron 15 mg qhs   Review of Systems  Constitutional: Negative for weight loss.  HENT: Negative for hearing loss.   Eyes: Negative for blurred vision.  Respiratory: Negative for shortness of breath.   Cardiovascular: Positive for leg swelling. Negative for chest pain.  Gastrointestinal: Positive for blood in stool.  Genitourinary: Positive for hematuria.  Musculoskeletal: Negative for falls.  Skin: Negative for rash.  Neurological: Negative for headaches.  Psychiatric/Behavioral: Positive for depression. The patient is nervous/anxious.    Past Medical History:  Diagnosis Date  . Acoustic neuroma ([place])   . Allergy   . Asthma   . Bilateral swelling of feet    and legs  . Bladder infection   . CAD (coronary artery disease)   . Cataract   . Change in voice   . Compression fracture of body of thoracic vertebra (HCC)    T12 09/18/15 MRI s/p fall   . Constipation   . COPD (chronic obstructive pulmonary disease) ([place])    previous CXR with chronic interstitial lung dz   . CVA (cerebral vascular accident) ([place])   . Depression   . Diabetes ([place])    with neuropathy  . Diabetes mellitus, type 2 ([place])   .  Diarrhea   . Double vision   . DVT (deep venous thrombosis) ([place])    right leg 10/2015 was on coumadin off as of 2017/2018 ; s/p IVC filter  . Enuresis   . Eye pain, right   . Fall   . Fatty liver    09/15/15 also mildly dilated pancreatitic duct rec MRCP small sub cm cyst hemangioma speeln mild right hydronephrorossi and prox. hydroureter, kidney stones, mild scarring kidneys  . Female stress incontinence   . Flank pain   . GERD (gastroesophageal reflux disease)    with small hiatal hernia   . Hard of hearing   . Heart disease   . History of kidney problems   . Hyperlipidemia    mixed  . Hypertension   . Hypothyroidism, postsurgical   . Impaired mobility and ADLs    uses rolling walker has caretaker 24/7 at home  . Leg edema   . Mixed incontinence urge and stress (female)(female)   . Neuropathy   . Osteoarthritis    DDD spine   . Osteoporosis with fracture    T12 compression fracture  . Photophobia   . Pulmonary embolism ([place])    10/2015 off coumadin as of 04/2016  . Pulmonary HTN (HCC)    mild pulm HTN, echo 10/09/15 EF 98-11%BJYNW 1 dd, RV systolic pressure increased   . Recurrent UTI   .  Sinus pressure   . Skin cancer    BCC jawline and scalp   . Thyroid disease    follows [facility]  . TIA (transient ischemic attack)    MRI 2009/2010 neg stroke   . Trigeminal neuralgia    Dr. Tomi Bamberger s/p gamma knife x 2, on Tegretol since 2011/2012 no increase in dose >200 mg bid rec per family per neurology   . Urinary frequency   . Urinary, incontinence, stress female    Dr Erlene Quan urology    Past Surgical History:  Procedure Laterality Date  . APPENDECTOMY     as a child, open  . BRAIN SURGERY     schwnnoma removal 1996   . brain tumor surgery    . BREAST SURGERY     breast bx  . CATARACT EXTRACTION    . CHOLECYSTECTOMY    . EYE SURGERY     cataract  . IVC FILTER PLACEMENT ([facility] HX)     Dr. Lucky Cowboy 10/2015   . LAPAROSCOPIC TUBAL LIGATION    . MOHS SURGERY      scalp 04/2014   . PERIPHERAL VASCULAR CATHETERIZATION N/A 10/11/2015   Procedure: IVC Filter Insertion;  Surgeon: Algernon Huxley, MD;  Location: [facility];  Service: Cardiovascular;  Laterality: N/A;  . PERIPHERAL VASCULAR THROMBECTOMY Bilateral 03/29/2018   Procedure: PERIPHERAL VASCULAR THROMBECTOMY;  Surgeon: Algernon Huxley, MD;  Location: [facility];  Service: Cardiovascular;  Laterality: Bilateral;  . PUBOVAGINAL SLING    . THROAT SURGERY    . THYROID SURGERY     tumor around vocal cords   . TOOTH EXTRACTION     winter 2018   . TOTAL THYROIDECTOMY  1976   Family History  Problem Relation Age of Onset  . Heart disease Mother   . Diabetes Father   . Cancer Daughter        breast ca x 2 s/p mastectomy    Social History   Socioeconomic History  . Marital status: Widowed    Spouse name: Not on file  . Number of children: 5  . Years of education: Not on file  . Highest education level: Not on file  Occupational History  . Occupation: retired  Tobacco Use  . Smoking status: Former Smoker    Packs/day: 0.50    Years: 20.00    Pack years: 10.00    Types: Cigarettes    Quit date: 09/20/1995    Years since quitting: 24.8  . Smokeless tobacco: Never Used  . Tobacco comment: quit 1996 smoked 20 years max 8 cig qd   Vaping Use  . Vaping Use: Never used  Substance and Sexual Activity  . Alcohol use: No  . Drug use: No  . Sexual activity: Not on file  Other Topics Concern  . Not on file  Social History Narrative   Lives at home with caretaker currently [name]    Has children    Likes to ride stationary bike and sew             Social Determinants of Health   Financial Resource Strain:   . Difficulty of Paying Living Expenses:   Food Insecurity:   . Worried About Charity fundraiser in the Last Year:   . Arboriculturist in the Last Year:   Transportation Needs:   . Film/video editor (Medical):   Marland Kitchen Lack of Transportation (Non-Medical):   Physical  Activity: Sufficiently Active  .  Days of Exercise per Week: 4 days  . Minutes of Exercise per Session: 40 min  Stress:   . Feeling of Stress :   Social Connections:   . Frequency of Communication with Friends and Family:   . Frequency of Social Gatherings with Friends and Family:   . Attends Religious Services:   . Active Member of Clubs or Organizations:   . Attends Archivist Meetings:   Marland Kitchen Marital Status:   Intimate Partner Violence:   . Fear of Current or Ex-Partner:   . Emotionally Abused:   Marland Kitchen Physically Abused:   . Sexually Abused:    Current Meds  Medication Sig  . acetaminophen (TYLENOL) 325 MG tablet Take 2 tablets (650 mg total) by mouth every 6 (six) hours as needed.  Marland Kitchen acetaminophen-codeine (TYLENOL #3) 300-30 MG tablet Take 1 tablet by mouth 2 (two) times daily as needed for moderate pain.  Marland Kitchen albuterol (PROVENTIL) (2.5 MG/3ML) 0.083% nebulizer solution Take 2.5 mg by nebulization 3 (three) times daily.  . AMBULATORY NON FORMULARY MEDICATION Medication Name: Please dispense flutter valve to use three times daily after nebulization. DX:J47.9  . apixaban (ELIQUIS) 2.5 MG TABS tablet Take 1 tablet (2.5 mg total) by mouth 2 (two) times daily.  Marland Kitchen atorvastatin (LIPITOR) 40 MG tablet Take 1 tablet (40 mg total) by mouth daily at 6 PM. Generic ok  . budesonide-formoterol (SYMBICORT) 160-4.5 MCG/ACT inhaler Inhale 2 puffs into the lungs 2 (two) times daily. Rinse mouth  . Calcium Carbonate-Vitamin D (CALCIUM 600+D) 600-400 MG-UNIT tablet Take 1 tablet by mouth 2 (two) times daily. Lunch and dinner  . cetirizine (ZYRTEC) 10 MG tablet Take 1 tablet (10 mg total) by mouth daily.  . fluticasone (FLONASE) 50 MCG/ACT nasal spray Place 1-2 sprays into both nostrils daily. Max 2 sprays  . furosemide (LASIX) 40 MG tablet Take 1 tablet (40 mg total) by mouth daily. May take 2nd dose 40 mg if needed at lunch for 5 days as needed  . gabapentin (NEURONTIN) 100 MG capsule Take 2  capsules (200 mg total) by mouth 2 (two) times daily. (Patient taking differently: Take 200 mg by mouth 3 (three) times daily. )  . guaiFENesin (MUCINEX) 600 MG 12 hr tablet Take 600 mg by mouth daily.   . hydrocortisone 2.5 % cream Apply topically 2 (two) times daily.  Marland Kitchen ipratropium-albuterol (DUONEB) 0.5-2.5 (3) MG/3ML SOLN Inhale 3 mLs into the lungs every 4 (four) hours as needed.  Marland Kitchen levothyroxine (SYNTHROID) 200 MCG tablet Take 1 tablet (200 mcg total) by mouth daily before breakfast. Except on Sunday. Do not take with other medications or vitamins  . Melatonin 10 MG TABS Take by mouth.   . mirtazapine (REMERON) 15 MG tablet Take 1 tablet (15 mg total) by mouth at bedtime.  . Multiple Vitamin (MULTIVITAMIN WITH MINERALS) TABS tablet Take 1 tablet by mouth daily.  . pantoprazole (PROTONIX) 40 MG tablet Take 1 tablet (40 mg total) by mouth daily. 30 minutes before lunch or dinner  . polyethylene glycol powder (GLYCOLAX/MIRALAX) 17 GM/SCOOP powder Take 17 g by mouth daily.  . polyvinyl alcohol (LIQUIFILM TEARS) 1.4 % ophthalmic solution Place 1 drop into both eyes as needed for dry eyes.  . sitaGLIPtin (JANUVIA) 50 MG tablet Take 1 tablet (50 mg total) by mouth daily.  Marland Kitchen tiotropium (SPIRIVA) 18 MCG inhalation capsule Place 1 capsule (18 mcg total) into inhaler and inhale daily.  . [DISCONTINUED] furosemide (LASIX) 20 MG tablet Take 1 tablet (20 mg  total) by mouth daily. May take 2nd dose 20 mg if needed at lunch. Not reduced dose to use 1-2 x per day   Allergies  Allergen Reactions  . Penicillins Shortness Of Breath, Rash and Other (See Comments)    Has patient had a PCN reaction causing immediate rash, facial/tongue/throat swelling, SOB or lightheadedness with hypotension: Yes Has patient had a PCN reaction causing severe rash involving mucus membranes or skin necrosis: No Has patient had a PCN reaction that required hospitalization No Has patient had a PCN reaction occurring within the last  10 years: No If all of the above answers are "NO", then may proceed with Cephalosporin use.  . Sulfa Antibiotics Shortness Of Breath, Rash and Other (See Comments)  . Amitiza [Lubiprostone]     N/v/d  . Aspirin Other (See Comments)    Reaction:  Unknown  Other reaction(s): Bleeding (intolerance) Per patient " causes nose to bleed" Can take 81 mg daily without any complications Other reaction(s): "bloody nose"   . Penicillin G Other (See Comments)    Has patient had a PCN reaction causing immediate rash, facial/tongue/throat swelling, SOB or lightheadedness with hypotension: No Has patient had a PCN reaction causing severe rash involving mucus membranes or skin necrosis: Unknown Has patient had a PCN reaction that required hospitalization: Unknown Has patient had a PCN reaction occurring within the last 10 years: Unknown If all of the above answers are "NO", then may proceed with Cephalosporin use.   Recent Results (from the past 2160 hour(s))  Urinalysis, Routine w reflex microscopic     Status: Abnormal   Collection Time: 07/06/20  2:38 PM  Result Value Ref Range   Color, Urine YELLOW YELLOW   APPearance CLEAR CLEAR   Specific Gravity, Urine 1.009 1.001 - 1.03   pH 6.5 5.0 - 8.0   Glucose, UA NEGATIVE NEGATIVE   Bilirubin Urine NEGATIVE NEGATIVE   Ketones, ur NEGATIVE NEGATIVE   Hgb urine dipstick NEGATIVE NEGATIVE   Protein, ur NEGATIVE NEGATIVE   Nitrite NEGATIVE NEGATIVE   Leukocytes,Ua 2+ (A) NEGATIVE   WBC, UA 6-10 (A) 0 - 5 /HPF   RBC / HPF NONE SEEN 0 - 2 /HPF   Squamous Epithelial / LPF NONE SEEN < OR = 5 /HPF   Bacteria, UA NONE SEEN NONE SEEN /HPF   Hyaline Cast NONE SEEN NONE SEEN /LPF  Urine Culture     Status: None   Collection Time: 07/06/20  2:38 PM   Specimen: Urine  Result Value Ref Range   MICRO NUMBER: [id number]    SPECIMEN QUALITY: Adequate    Sample Source URINE    STATUS: FINAL    Result:      Growth of mixed flora was isolated, suggesting  probable contamination. No further testing will be performed. If clinically indicated, recollection using a method to minimize contamination, with prompt transfer to Urine Culture Transport Tube, is  recommended.   CBC with Differential/Platelet     Status: None   Collection Time: 07/06/20  2:52 PM  Result Value Ref Range   WBC 8.3 4.0 - 10.5 K/uL   RBC 4.67 3.87 - 5.11 Mil/uL   Hemoglobin 14.5 12.0 - 15.0 g/dL   HCT 44.1 36 - 46 %   MCV 94.4 78.0 - 100.0 fl   MCHC 32.9 30.0 - 36.0 g/dL   RDW 15.0 11.5 - 15.5 %   Platelets 191.0 150 - 400 K/uL   Neutrophils Relative % 67.6 43 - 77 %  Lymphocytes Relative 21.4 12 - 46 %   Monocytes Relative 8.2 3 - 12 %   Eosinophils Relative 2.0 0 - 5 %   Basophils Relative 0.8 0 - 3 %   Neutro Abs 5.6 1.4 - 7.7 K/uL   Lymphs Abs 1.8 0.7 - 4.0 K/uL   Monocytes Absolute 0.7 0 - 1 K/uL   Eosinophils Absolute 0.2 0 - 0 K/uL   Basophils Absolute 0.1 0 - 0 K/uL  NT-proBNP     Status: None   Collection Time: 07/06/20  2:52 PM  Result Value Ref Range   Pro B Natriuretic peptide (BNP) 284 pg/mL    Comment: . For Heart Failure (HF) diagnosis, reference ranges in patients with dyspnea are based on Januzzi JL, Et al. Am Coll Cardiol. 3212;24:8250-0370. Marland Kitchen 18-49 years: <= 300 pg/mL Normal, HF unlikely >= 450 pg/mL High probability of HF 50-75 years: <= 300 pg/mL Normal, HF unlikely >= 900 pg/mL High probability of HF >75 years: <= 300 pg/mL Normal, HF unlikely >= 1800 pg/mL High probability of HF . For patients with coronary heart disease, the optimal risk category cut points for incident HF or CVD death (<253 pg/mL men, <372 pg/mL women) are based on Omland T, et al. J Am Coll Cardiol. 4888:91:694-50. . For patients with existing HF, the optimal risk category cut point for HF progression (<300 pg/mL) is based on Binnie Kand, et al. Clin Biochem. 2010;43:1405-10. . For additional information, please refer  to http://education.questdiagnostics.com/faq/FAQ202 (This link is being provided for informational/ educational purposes only.) .   Comprehensive metabolic panel     Status: Abnormal   Collection Time: 07/06/20  2:52 PM  Result Value Ref Range   Sodium 142 135 - 145 mEq/L   Potassium 4.3 3.5 - 5.1 mEq/L   Chloride 103 96 - 112 mEq/L   CO2 33 (H) 19 - 32 mEq/L   Glucose, Bld 93 70 - 99 mg/dL   BUN 18 6 - 23 mg/dL   Creatinine, Ser 1.36 (H) 0.40 - 1.20 mg/dL   Total Bilirubin 0.6 0.2 - 1.2 mg/dL   Alkaline Phosphatase 62 39 - 117 U/L   AST 21 0 - 37 U/L   ALT 15 0 - 35 U/L   Total Protein 6.7 6.0 - 8.3 g/dL   Albumin 3.9 3.5 - 5.2 g/dL   GFR 36.35 (L) >60.00 mL/min   Calcium 9.2 8.4 - 10.5 mg/dL  Hemoglobin A1c     Status: None   Collection Time: 07/06/20  2:52 PM  Result Value Ref Range   Hgb A1c MFr Bld 6.1 4.6 - 6.5 %    Comment: Glycemic Control Guidelines for People with Diabetes:Non Diabetic:  <6%Goal of Therapy: <7%Additional Action Suggested:  >8%   POCT occult blood stool     Status: Normal   Collection Time: 07/06/20  3:00 PM  Result Value Ref Range   Fecal Occult Blood, POC     Card #1 Date     Card #2 Fecal Occult Blod, POC     Card #2 Date     Card #3 Fecal Occult Blood, POC     Card #3 Date    Comprehensive metabolic panel     Status: Abnormal   Collection Time: 07/09/20 12:34 PM  Result Value Ref Range   Sodium 142 135 - 145 mmol/L   Potassium 4.3 3.5 - 5.1 mmol/L   Chloride 102 98 - 111 mmol/L   CO2 31 22 - 32 mmol/L  Glucose, Bld 115 (H) 70 - 99 mg/dL    Comment: Glucose reference range applies only to samples taken after fasting for at least 8 hours.   BUN 20 8 - 23 mg/dL   Creatinine, Ser 1.35 (H) 0.44 - 1.00 mg/dL   Calcium 8.7 (L) 8.9 - 10.3 mg/dL   Total Protein 7.3 6.5 - 8.1 g/dL   Albumin 3.9 3.5 - 5.0 g/dL   AST 26 15 - 41 U/L   ALT 17 0 - 44 U/L   Alkaline Phosphatase 64 38 - 126 U/L   Total Bilirubin 0.9 0.3 - 1.2 mg/dL   GFR calc non  Af Amer 34 (L) >60 mL/min   GFR calc Af Amer 39 (L) >60 mL/min   Anion gap 9 5 - 15    Comment: Performed at [facility], [address] [place], [place] [address]  CBC with Differential/Platelet     Status: None   Collection Time: 07/09/20 12:34 PM  Result Value Ref Range   WBC 9.1 4.0 - 10.5 K/uL   RBC 4.68 3.87 - 5.11 MIL/uL   Hemoglobin 14.3 12.0 - 15.0 g/dL   HCT 43.8 36 - 46 %   MCV 93.6 80.0 - 100.0 fL   MCH 30.6 26.0 - 34.0 pg   MCHC 32.6 30.0 - 36.0 g/dL   RDW 14.2 11.5 - 15.5 %   Platelets 181 150 - 400 K/uL   nRBC 0.0 0.0 - 0.2 %   Neutrophils Relative % 68 %   Neutro Abs 6.1 1.7 - 7.7 K/uL   Lymphocytes Relative 21 %   Lymphs Abs 1.9 0.7 - 4.0 K/uL   Monocytes Relative 8 %   Monocytes Absolute 0.8 0 - 1 K/uL   Eosinophils Relative 2 %   Eosinophils Absolute 0.2 0 - 0 K/uL   Basophils Relative 0 %   Basophils Absolute 0.0 0 - 0 K/uL   Immature Granulocytes 1 %   Abs Immature Granulocytes 0.05 0.00 - 0.07 K/uL    Comment: Performed at [facility], [address] [place], [place] [address]   Objective  Body mass index is 30.6 kg/m. Wt Readings from Last 3 Encounters:  07/09/20 197 lb 6.4 oz (89.5 kg)  07/06/20 195 lb 6.4 oz (88.6 kg)  04/06/20 197 lb 6.4 oz (89.5 kg)   Temp Readings from Last 3 Encounters:  07/09/20 98 F (36.7 C)  07/06/20 98 F (36.7 C) (Oral)  04/06/20 (!) 97.3 F (36.3 C) (Temporal)   BP Readings from Last 3 Encounters:  07/09/20 118/74  07/06/20 118/84  04/06/20 118/76   Pulse Readings from Last 3 Encounters:  07/09/20 76  07/06/20 78  04/06/20 73    Physical Exam Vitals and nursing note reviewed.  Constitutional:      Appearance: Normal appearance. She is well-developed and well-groomed.  HENT:     Head: Normocephalic and atraumatic.  Eyes:     Conjunctiva/sclera: Conjunctivae normal.     Pupils: Pupils are equal, round, and reactive to light.  Cardiovascular:     Rate and Rhythm: Normal rate and  regular rhythm.     Heart sounds: Normal heart sounds. No murmur heard.   Pulmonary:     Effort: Pulmonary effort is normal.     Breath sounds: Normal breath sounds.  Genitourinary:    Rectum: Guaiac result negative.     Comments: A lot of stool in vault removed today  fobt negative  Skin:    General:  Skin is warm and dry.  Neurological:     General: No focal deficit present.     Mental Status: She is alert and oriented to person, place, and time. Mental status is at baseline.     Gait: Gait normal.  Psychiatric:        Attention and Perception: Attention and perception normal.        Mood and Affect: Mood and affect normal.        Speech: Speech normal.        Behavior: Behavior normal. Behavior is cooperative.        Thought Content: Thought content normal.        Cognition and Memory: Cognition and memory normal.        Judgment: Judgment normal.     Assessment  Plan  Bilateral leg edema likely lymphedema - Plan: furosemide (LASIX) 40 MG tablet, ECHOCARDIOGRAM COMPLETE, Comprehensive metabolic panel, proBNP  Hematuria, unspecified type - Plan: Urinalysis, Routine w reflex microscopic, Urine Culture, CBC with Differential/Platelet, Comprehensive metabolic panel  Stage 3b chronic kidney disease - Plan: CBC with Differential/Platelet, NT-proBNP, US Renal, ECHOCARDIOGRAM COMPLETE, Comprehensive metabolic panel F/u renal [place] [facility]   Hypertension associated with diabetes ([place]) - Plan: ECHOCARDIOGRAM COMPLETE, Hemoglobin A1c  Chronic obstructive pulmonary disease, unspecified COPD type ([place]) Given same trelegy x 1 today   Blood in stool - Plan: POCT occult blood stool negative today   Anxiety and depression  Cont remeron 15  HM Flu10/27/20utd prevnar had 09/20/16 Tdap 08/04/16 shingrixnot had out of stock called pharmacy,? zostavaxif had MMR immune covid 2nd 2/2  Estdermatologywill see 7 or 07/2020 Dr. Evorn Gong Out of age window pap, colonoscopy,  mammo DEXA 01/01/08 osteopeniawill make sure on ca 600 bid and vit D 1000 iu qd -Vitamin d 03/15/18 48.1 -disc with patient and family at f/u if want to [place] futureDEXA   Provider: Dr. Olivia Mackie McLean-Scocuzza-Internal Medicine

## 2020-07-07 ENCOUNTER — Telehealth: Payer: Self-pay | Admitting: Internal Medicine

## 2020-07-07 LAB — COMPREHENSIVE METABOLIC PANEL
ALT: 15 U/L (ref 0–35)
AST: 21 U/L (ref 0–37)
Albumin: 3.9 g/dL (ref 3.5–5.2)
Alkaline Phosphatase: 62 U/L (ref 39–117)
BUN: 18 mg/dL (ref 6–23)
CO2: 33 mEq/L — ABNORMAL HIGH (ref 19–32)
Calcium: 9.2 mg/dL (ref 8.4–10.5)
Chloride: 103 mEq/L (ref 96–112)
Creatinine, Ser: 1.36 mg/dL — ABNORMAL HIGH (ref 0.40–1.20)
GFR: 36.35 mL/min — ABNORMAL LOW (ref >60.00)
Glucose, Bld: 93 mg/dL (ref 70–99)
Potassium: 4.3 mEq/L (ref 3.5–5.1)
Sodium: 142 mEq/L (ref 135–145)
Total Bilirubin: 0.6 mg/dL (ref 0.2–1.2)
Total Protein: 6.7 g/dL (ref 6.0–8.3)

## 2020-07-07 LAB — CBC WITH DIFFERENTIAL/PLATELET
Basophils Absolute: 0.1 10*3/uL (ref 0.0–0.1)
Basophils Relative: 0.8 % (ref 0.0–3.0)
Eosinophils Absolute: 0.2 10*3/uL (ref 0.0–0.7)
Eosinophils Relative: 2 % (ref 0.0–5.0)
HCT: 44.1 % (ref 36.0–46.0)
Hemoglobin: 14.5 g/dL (ref 12.0–15.0)
Lymphocytes Relative: 21.4 % (ref 12.0–46.0)
Lymphs Abs: 1.8 10*3/uL (ref 0.7–4.0)
MCHC: 32.9 g/dL (ref 30.0–36.0)
MCV: 94.4 fl (ref 78.0–100.0)
Monocytes Absolute: 0.7 10*3/uL (ref 0.1–1.0)
Monocytes Relative: 8.2 % (ref 3.0–12.0)
Neutro Abs: 5.6 10*3/uL (ref 1.4–7.7)
Neutrophils Relative %: 67.6 % (ref 43.0–77.0)
Platelets: 191 10*3/uL (ref 150.0–400.0)
RBC: 4.67 Mil/uL (ref 3.87–5.11)
RDW: 15 % (ref 11.5–15.5)
WBC: 8.3 10*3/uL (ref 4.0–10.5)

## 2020-07-07 LAB — HEMOGLOBIN A1C: Hgb A1c MFr Bld: 6.1 % (ref 4.6–6.5)

## 2020-07-07 NOTE — Telephone Encounter (Signed)
Spoke with patient's caregiver mary cummings. They needed clarification on how to give this medication to the Patient.  Clarified that the Patient is to take lasix every morning. If she continues to have swelling she can take an as needed dose at lunch time. She can do the twice daily for 5 days.  Caregiver states they wanted to give the Patient medication at 8 am and 8 pm. Advised that Dr Olivia Mackie McLean-Scocuzza recommends morning and lunch as Lasix increases urinary frequency.

## 2020-07-07 NOTE — Telephone Encounter (Signed)
Pt is refusing to take medication until Dr. Olivia Mackie calls her. Her daughter called to let us know she is giving care taker a hard time about the lasix prescription. She said please call our home phone and the care taker will answer.

## 2020-07-08 ENCOUNTER — Other Ambulatory Visit: Payer: Self-pay

## 2020-07-08 DIAGNOSIS — Z86718 Personal history of other venous thrombosis and embolism: Secondary | ICD-10-CM

## 2020-07-08 LAB — URINALYSIS, ROUTINE W REFLEX MICROSCOPIC
Bacteria, UA: NONE SEEN /HPF
Bilirubin Urine: NEGATIVE
Glucose, UA: NEGATIVE
Hgb urine dipstick: NEGATIVE
Hyaline Cast: NONE SEEN /LPF
Ketones, ur: NEGATIVE
Nitrite: NEGATIVE
Protein, ur: NEGATIVE
RBC / HPF: NONE SEEN /HPF (ref 0–2)
Specific Gravity, Urine: 1.009 (ref 1.001–1.03)
Squamous Epithelial / HPF: NONE SEEN /HPF (ref <=5)
pH: 6.5 (ref 5.0–8.0)

## 2020-07-08 LAB — URINE CULTURE
MICRO NUMBER:: 10781589
SPECIMEN QUALITY:: ADEQUATE

## 2020-07-08 LAB — NT-PROBNP: Pro B Natriuretic peptide (BNP): 284 pg/mL

## 2020-07-09 ENCOUNTER — Other Ambulatory Visit: Payer: Self-pay

## 2020-07-09 ENCOUNTER — Inpatient Hospital Stay: Payer: Medicare PPO | Attending: Oncology

## 2020-07-09 ENCOUNTER — Inpatient Hospital Stay (HOSPITAL_BASED_OUTPATIENT_CLINIC_OR_DEPARTMENT_OTHER): Payer: Medicare PPO | Admitting: Oncology

## 2020-07-09 ENCOUNTER — Encounter: Payer: Self-pay | Admitting: Oncology

## 2020-07-09 VITALS — BP 118/74 | HR 76 | Temp 98.0°F | Resp 18 | Wt 197.4 lb

## 2020-07-09 DIAGNOSIS — Z803 Family history of malignant neoplasm of breast: Secondary | ICD-10-CM | POA: Insufficient documentation

## 2020-07-09 DIAGNOSIS — Z7901 Long term (current) use of anticoagulants: Secondary | ICD-10-CM | POA: Insufficient documentation

## 2020-07-09 DIAGNOSIS — Z86718 Personal history of other venous thrombosis and embolism: Secondary | ICD-10-CM | POA: Insufficient documentation

## 2020-07-09 DIAGNOSIS — Z95828 Presence of other vascular implants and grafts: Secondary | ICD-10-CM

## 2020-07-09 DIAGNOSIS — Z87891 Personal history of nicotine dependence: Secondary | ICD-10-CM | POA: Insufficient documentation

## 2020-07-09 LAB — CBC WITH DIFFERENTIAL/PLATELET
Abs Immature Granulocytes: 0.05 10*3/uL (ref 0.00–0.07)
Basophils Absolute: 0 10*3/uL (ref 0.0–0.1)
Basophils Relative: 0 %
Eosinophils Absolute: 0.2 10*3/uL (ref 0.0–0.5)
Eosinophils Relative: 2 %
HCT: 43.8 % (ref 36.0–46.0)
Hemoglobin: 14.3 g/dL (ref 12.0–15.0)
Immature Granulocytes: 1 %
Lymphocytes Relative: 21 %
Lymphs Abs: 1.9 10*3/uL (ref 0.7–4.0)
MCH: 30.6 pg (ref 26.0–34.0)
MCHC: 32.6 g/dL (ref 30.0–36.0)
MCV: 93.6 fL (ref 80.0–100.0)
Monocytes Absolute: 0.8 10*3/uL (ref 0.1–1.0)
Monocytes Relative: 8 %
Neutro Abs: 6.1 10*3/uL (ref 1.7–7.7)
Neutrophils Relative %: 68 %
Platelets: 181 10*3/uL (ref 150–400)
RBC: 4.68 MIL/uL (ref 3.87–5.11)
RDW: 14.2 % (ref 11.5–15.5)
WBC: 9.1 10*3/uL (ref 4.0–10.5)
nRBC: 0 % (ref 0.0–0.2)

## 2020-07-09 LAB — COMPREHENSIVE METABOLIC PANEL
ALT: 17 U/L (ref 0–44)
AST: 26 U/L (ref 15–41)
Albumin: 3.9 g/dL (ref 3.5–5.0)
Alkaline Phosphatase: 64 U/L (ref 38–126)
Anion gap: 9 (ref 5–15)
BUN: 20 mg/dL (ref 8–23)
CO2: 31 mmol/L (ref 22–32)
Calcium: 8.7 mg/dL — ABNORMAL LOW (ref 8.9–10.3)
Chloride: 102 mmol/L (ref 98–111)
Creatinine, Ser: 1.35 mg/dL — ABNORMAL HIGH (ref 0.44–1.00)
GFR calc Af Amer: 39 mL/min — ABNORMAL LOW (ref >60)
GFR calc non Af Amer: 34 mL/min — ABNORMAL LOW (ref >60)
Glucose, Bld: 115 mg/dL — ABNORMAL HIGH (ref 70–99)
Potassium: 4.3 mmol/L (ref 3.5–5.1)
Sodium: 142 mmol/L (ref 135–145)
Total Bilirubin: 0.9 mg/dL (ref 0.3–1.2)
Total Protein: 7.3 g/dL (ref 6.5–8.1)

## 2020-07-09 NOTE — Progress Notes (Signed)
Patient denies new problems/concerns today.   °

## 2020-07-09 NOTE — Progress Notes (Signed)
Hematology/Oncology follow up note [facility] Telephone:(336) [phone] Fax:([phone]) [phone]   Patient Care Team: McLean-Scocuzza, Nino Glow, MD as PCP - General (Internal Medicine)  REFERRING PROVIDER: McLean-Scocuzza, Nino Glow, MD REASON FOR VISIT Follow up for treatment of DVT  HISTORY OF PRESENTING ILLNESS:  Jessica Erickson is a  86 y.o.  female with PMH listed below who was referred to me for evaluation of DVT and chronic anticoagulation.  She has hard hearing and most history was obtained from her son and caregiver who accompanied her to clinic today.   Patient had a history of provoked right femoral and popliteal DVT and PE in 2016 after an episode of fall and back procedure. S/p IVC filter placed. She was on coumadin (19mg  daily per pcp) for anticoagulation for a period of time and eventually came off. IVC filter was left in after a discussion between Dr.Dew and family, given her advanced age and multiple comorbidities.  04/04/2018 she had right upper extremity pain and swelling. US showed a subcutaneous soft tissue hematoma  Patient's anticoagulation has been changed from Coumadin to Eliquis 2.5 mg twice daily  INTERVAL HISTORY Jessica Erickson is a 86 y.o. female who has above history reviewed by me today presents for follow-up of DVT, on chronic anticoagulation.  Patient is hard hearing, she was accompanied by her caregiver [name].  I communicate with patient via writing on white board.  Patient reports feeling well.  Chronic lower extremity swelling, unchanged comparing to previous.  Swelling is worse at the end of the day.  Easy bruising.  Patient is on Eliquis 2.5 mg twice daily.  No acute bleeding events.  Review of Systems  Constitutional: Negative for chills, fever, malaise/fatigue and weight loss.  HENT: Positive for hearing loss. Negative for sore throat.   Eyes: Negative for redness.  Respiratory: Negative for cough, shortness of breath and wheezing.     Cardiovascular: Negative for chest pain, palpitations and leg swelling.       Trace leg swelling  Gastrointestinal: Negative for abdominal pain, blood in stool, nausea and vomiting.  Genitourinary: Negative for dysuria.  Musculoskeletal: Negative for myalgias.  Skin: Negative for rash.  Neurological: Negative for dizziness, tingling and tremors.  Endo/Heme/Allergies: Bruises/bleeds easily.  Psychiatric/Behavioral: Negative for hallucinations.    MEDICAL HISTORY:  Past Medical History:  Diagnosis Date  . Acoustic neuroma ([place])   . Allergy   . Asthma   . Bilateral swelling of feet    and legs  . Bladder infection   . CAD (coronary artery disease)   . Cataract   . Change in voice   . Compression fracture of body of thoracic vertebra (HCC)    T12 09/18/15 MRI s/p fall   . Constipation   . COPD (chronic obstructive pulmonary disease) ([place])    previous CXR with chronic interstitial lung dz   . CVA (cerebral vascular accident) ([place])   . Depression   . Diabetes ([place])    with neuropathy  . Diabetes mellitus, type 2 ([place])   . Diarrhea   . Double vision   . DVT (deep venous thrombosis) ([place])    right leg 10/2015 was on coumadin off as of 2017/2018 ; s/p IVC filter  . Enuresis   . Eye pain, right   . Fall   . Fatty liver    09/15/15 also mildly dilated pancreatitic duct rec MRCP small sub cm cyst hemangioma speeln mild right hydronephrorossi and prox. hydroureter, kidney stones, mild scarring kidneys  .  Female stress incontinence   . Flank pain   . GERD (gastroesophageal reflux disease)    with small hiatal hernia   . Hard of hearing   . Heart disease   . History of kidney problems   . Hyperlipidemia    mixed  . Hypertension   . Hypothyroidism, postsurgical   . Impaired mobility and ADLs    uses rolling walker has caretaker 24/7 at home  . Leg edema   . Mixed incontinence urge and stress (female)(female)   . Neuropathy   . Osteoarthritis    DDD spine   . Osteoporosis  with fracture    T12 compression fracture  . Photophobia   . Pulmonary embolism ([place])    10/2015 off coumadin as of 04/2016  . Pulmonary HTN (HCC)    mild pulm HTN, echo 10/09/15 EF 43-32%RJJOA 1 dd, RV systolic pressure increased   . Recurrent UTI   . Sinus pressure   . Skin cancer    BCC jawline and scalp   . Thyroid disease    follows [facility]  . TIA (transient ischemic attack)    MRI 2009/2010 neg stroke   . Trigeminal neuralgia    Dr. Tomi Bamberger s/p gamma knife x 2, on Tegretol since 2011/2012 no increase in dose >200 mg bid rec per family per neurology   . Urinary frequency   . Urinary, incontinence, stress female    Dr Erlene Quan urology     SURGICAL HISTORY: Past Surgical History:  Procedure Laterality Date  . APPENDECTOMY     as a child, open  . BRAIN SURGERY     schwnnoma removal 1996   . brain tumor surgery    . BREAST SURGERY     breast bx  . CATARACT EXTRACTION    . CHOLECYSTECTOMY    . EYE SURGERY     cataract  . IVC FILTER PLACEMENT ([facility] HX)     Dr. Lucky Cowboy 10/2015   . LAPAROSCOPIC TUBAL LIGATION    . MOHS SURGERY     scalp 04/2014   . PERIPHERAL VASCULAR CATHETERIZATION N/A 10/11/2015   Procedure: IVC Filter Insertion;  Surgeon: Algernon Huxley, MD;  Location: [facility];  Service: Cardiovascular;  Laterality: N/A;  . PERIPHERAL VASCULAR THROMBECTOMY Bilateral 03/29/2018   Procedure: PERIPHERAL VASCULAR THROMBECTOMY;  Surgeon: Algernon Huxley, MD;  Location: [place];  Service: Cardiovascular;  Laterality: Bilateral;  . PUBOVAGINAL SLING    . THROAT SURGERY    . THYROID SURGERY     tumor around vocal cords   . TOOTH EXTRACTION     winter 2018   . TOTAL THYROIDECTOMY  1976    SOCIAL HISTORY: Social History   Socioeconomic History  . Marital status: Widowed    Spouse name: Not on file  . Number of children: 5  . Years of education: Not on file  . Highest education level: Not on file  Occupational History  . Occupation: retired    Tobacco Use  . Smoking status: Former Smoker    Packs/day: 0.50    Years: 20.00    Pack years: 10.00    Types: Cigarettes    Quit date: 09/20/1995    Years since quitting: 24.8  . Smokeless tobacco: Never Used  . Tobacco comment: quit 1996 smoked 20 years max 8 cig qd   Vaping Use  . Vaping Use: Never used  Substance and Sexual Activity  . Alcohol use: No  . Drug use: No  .  Sexual activity: Not on file  Other Topics Concern  . Not on file  Social History Narrative   Lives at home with caretaker currently [name]    Has children    Likes to ride stationary bike and sew             Social Determinants of Health   Financial Resource Strain:   . Difficulty of Paying Living Expenses:   Food Insecurity:   . Worried About Charity fundraiser in the Last Year:   . Arboriculturist in the Last Year:   Transportation Needs:   . Film/video editor (Medical):   Marland Kitchen Lack of Transportation (Non-Medical):   Physical Activity: Sufficiently Active  . Days of Exercise per Week: 4 days  . Minutes of Exercise per Session: 40 min  Stress:   . Feeling of Stress :   Social Connections:   . Frequency of Communication with Friends and Family:   . Frequency of Social Gatherings with Friends and Family:   . Attends Religious Services:   . Active Member of Clubs or Organizations:   . Attends Archivist Meetings:   Marland Kitchen Marital Status:   Intimate Partner Violence:   . Fear of Current or Ex-Partner:   . Emotionally Abused:   Marland Kitchen Physically Abused:   . Sexually Abused:     FAMILY HISTORY: Family History  Problem Relation Age of Onset  . Heart disease Mother   . Diabetes Father   . Cancer Daughter        breast ca x 2 s/p mastectomy     ALLERGIES:  is allergic to penicillins, sulfa antibiotics, amitiza [lubiprostone], aspirin, and penicillin g.  MEDICATIONS:  Current Outpatient Medications  Medication Sig Dispense Refill  . acetaminophen (TYLENOL) 325 MG tablet Take 2  tablets (650 mg total) by mouth every 6 (six) hours as needed. 120 tablet 11  . acetaminophen-codeine (TYLENOL #3) 300-30 MG tablet Take 1 tablet by mouth 2 (two) times daily as needed for moderate pain. 20 tablet 0  . albuterol (PROVENTIL) (2.5 MG/3ML) 0.083% nebulizer solution Take 2.5 mg by nebulization 3 (three) times daily.    . AMBULATORY NON FORMULARY MEDICATION Medication Name: Please dispense flutter valve to use three times daily after nebulization. DX:J47.9 1 each 0  . apixaban (ELIQUIS) 2.5 MG TABS tablet Take 1 tablet (2.5 mg total) by mouth 2 (two) times daily. 180 tablet 3  . atorvastatin (LIPITOR) 40 MG tablet Take 1 tablet (40 mg total) by mouth daily at 6 PM. Generic ok 90 tablet 3  . budesonide-formoterol (SYMBICORT) 160-4.5 MCG/ACT inhaler Inhale 2 puffs into the lungs 2 (two) times daily. Rinse mouth 1 Inhaler 12  . Calcium Carbonate-Vitamin D (CALCIUM 600+D) 600-400 MG-UNIT tablet Take 1 tablet by mouth 2 (two) times daily. Lunch and dinner 180 tablet 3  . cetirizine (ZYRTEC) 10 MG tablet Take 1 tablet (10 mg total) by mouth daily. 90 tablet 3  . fluticasone (FLONASE) 50 MCG/ACT nasal spray Place 1-2 sprays into both nostrils daily. Max 2 sprays 16 g 11  . furosemide (LASIX) 40 MG tablet Take 1 tablet (40 mg total) by mouth daily. May take 2nd dose 40 mg if needed at lunch for 5 days as needed 120 tablet 3  . gabapentin (NEURONTIN) 100 MG capsule Take 2 capsules (200 mg total) by mouth 2 (two) times daily. 180 capsule 3  . guaiFENesin (MUCINEX) 600 MG 12 hr tablet Take 600 mg by mouth daily.     Marland Kitchen  hydrocortisone 2.5 % cream Apply topically 2 (two) times daily. 60 g 11  . ipratropium-albuterol (DUONEB) 0.5-2.5 (3) MG/3ML SOLN Inhale 3 mLs into the lungs every 4 (four) hours as needed. 360 mL 12  . levothyroxine (SYNTHROID) 200 MCG tablet Take 1 tablet (200 mcg total) by mouth daily before breakfast. Except on Sunday. Do not take with other medications or vitamins 90 tablet 3  .  Melatonin 10 MG TABS Take by mouth.     . mirtazapine (REMERON) 15 MG tablet Take 1 tablet (15 mg total) by mouth at bedtime. 90 tablet 3  . Multiple Vitamin (MULTIVITAMIN WITH MINERALS) TABS tablet Take 1 tablet by mouth daily.    . pantoprazole (PROTONIX) 40 MG tablet Take 1 tablet (40 mg total) by mouth daily. 30 minutes before lunch or dinner 90 tablet 3  . polyethylene glycol powder (GLYCOLAX/MIRALAX) 17 GM/SCOOP powder Take 17 g by mouth daily. 255 g 11  . polyvinyl alcohol (LIQUIFILM TEARS) 1.4 % ophthalmic solution Place 1 drop into both eyes as needed for dry eyes. 15 mL 11  . sitaGLIPtin (JANUVIA) 50 MG tablet Take 1 tablet (50 mg total) by mouth daily. 90 tablet 3  . tiotropium (SPIRIVA) 18 MCG inhalation capsule Place 1 capsule (18 mcg total) into inhaler and inhale daily. 90 capsule 3  . bisacodyl (DULCOLAX) 5 MG EC tablet Take 1 tablet (5 mg total) by mouth daily as needed for moderate constipation. (Patient not taking: Reported on 07/06/2020) 30 tablet 5  . DOK 100 MG capsule TAKE 1 CAPSULE BY MOUTH TWICE DAILY (Patient not taking: Reported on 07/06/2020) 180 capsule 3   No current facility-administered medications for this visit.     PHYSICAL EXAMINATION: ECOG PERFORMANCE STATUS: 1 - Symptomatic but completely ambulatory Vitals:   07/09/20 1306  BP: 118/74  Pulse: 76  Resp: 18  Temp: 98 F (36.7 C)   Filed Weights   07/09/20 1306  Weight: 197 lb 6.4 oz (89.5 kg)    Physical Exam Constitutional:      General: She is not in acute distress.    Appearance: She is well-developed.     Comments: Frail appearance elderly female, walks with walker  HENT:     Head: Normocephalic and atraumatic.  Eyes:     General: No scleral icterus.    Conjunctiva/sclera: Conjunctivae normal.     Pupils: Pupils are equal, round, and reactive to light.     Comments: pale  Cardiovascular:     Rate and Rhythm: Normal rate and regular rhythm.     Heart sounds: Normal heart sounds.    Pulmonary:     Effort: Pulmonary effort is normal. No respiratory distress.     Breath sounds: No wheezing.     Comments: Bibasilar crackles Abdominal:     General: Bowel sounds are normal.     Palpations: Abdomen is soft.  Musculoskeletal:        General: No deformity. Normal range of motion.     Cervical back: Normal range of motion and neck supple.     Comments: 2+ bilateral lower extremity edema  Lymphadenopathy:     Cervical: No cervical adenopathy.  Skin:    General: Skin is warm and dry.     Findings: No erythema or rash.     Comments: A few bruises on bilateral upper extremity.  Neurological:     Mental Status: She is alert.     Coordination: Coordination normal.     Comments: Hearing deficiency.  Psychiatric:        Mood and Affect: Mood normal.      LABORATORY DATA:  I have reviewed the data as listed Lab Results  Component Value Date   WBC 9.1 07/09/2020   HGB 14.3 07/09/2020   HCT 43.8 07/09/2020   MCV 93.6 07/09/2020   PLT 181 07/09/2020   Recent Labs    07/28/19 0957 07/28/19 0957 11/07/19 1250 11/07/19 1250 03/08/20 0955 03/08/20 0955 04/06/20 1252 07/06/20 1452 07/09/20 1234  NA 141   < > 144   < > 143   < > 141 142 142  K 4.1   < > 3.9   < > 4.2   < > 4.2 4.3 4.3  CL 102   < > 105   < > 104   < > 102 103 102  CO2 30   < > 31   < > 34*   < > 36* 33* 31  GLUCOSE 105*   < > 130*   < > 102*   < > 98 93 115*  BUN 18   < > 20   < > 17   < > 15 18 20   CREATININE 1.23*   < > 1.16*   < > 1.28*   < > 1.24* 1.36* 1.35*  CALCIUM 8.5*   < > 8.8*   < > 8.9   < > 8.9 9.2 8.7*  GFRNONAA 38*  --  41*  --   --   --   --   --  34*  GFRAA 44*  --  48*  --   --   --   --   --  39*  PROT 7.0   < > 6.8   < > 6.3  --   --  6.7 7.3  ALBUMIN 3.3*   < > 3.6   < > 3.5  --   --  3.9 3.9  AST 21   < > 21   < > 20  --   --  21 26  ALT 14   < > 15   < > 11  --   --  15 17  ALKPHOS 63   < > 53   < > 53  --   --  62 64  BILITOT 0.5   < > 0.5   < > 0.6  --   --  0.6 0.9    < > = values in this interval not displayed.      ASSESSMENT & PLAN:  1. History of deep vein thrombosis (DVT) of lower extremity   2. Chronic anticoagulation   3. Presence of IVC filter    #History of recurrent DVT, on chronic anticoagulation prophylaxis Eliquis 2.5 mg twice daily. So far she has tolerated very well. Chronic bilateral lower extremity swelling.  December 2020 lower extremity ultrasound showed no DVT. I will discharge patient to continue to follow-up with primary care provider. I am happy to see her again if she develops related symptoms or concerns.  We spent sufficient time to discuss many aspect of care, questions were answered to patient's satisfaction.  Earlie Server, MD, PhD Hematology Oncology [facility] Pager- [phone] 07/09/2020

## 2020-07-14 ENCOUNTER — Other Ambulatory Visit: Payer: Self-pay

## 2020-07-14 ENCOUNTER — Telehealth: Payer: Self-pay | Admitting: Internal Medicine

## 2020-07-14 ENCOUNTER — Other Ambulatory Visit: Payer: Self-pay | Admitting: Internal Medicine

## 2020-07-14 ENCOUNTER — Ambulatory Visit: Payer: Medicare PPO | Admitting: Podiatry

## 2020-07-14 DIAGNOSIS — J449 Chronic obstructive pulmonary disease, unspecified: Secondary | ICD-10-CM

## 2020-07-14 LAB — HEMOCCULT GUIAC POC 1CARD (OFFICE)

## 2020-07-14 MED ORDER — TRELEGY ELLIPTA 100-62.5-25 MCG/INH IN AEPB: 1.0000 | INHALATION_SPRAY | Freq: Every day | RESPIRATORY_TRACT | 0 refills | Status: DC

## 2020-07-14 MED ORDER — TRELEGY ELLIPTA 100-62.5-25 MCG/INH IN AEPB: 1.0000 | INHALATION_SPRAY | Freq: Every day | RESPIRATORY_TRACT | 12 refills | Status: DC

## 2020-07-14 NOTE — Telephone Encounter (Signed)
Pt's daughter called in stated that Dr.Tracy had given her mother some medication for  her breathing and it helped and would like her to write a prescription for it for her mother

## 2020-07-14 NOTE — Telephone Encounter (Signed)
Trelegy sent  d/c  symbicort and spiriva Inform daughter

## 2020-07-14 NOTE — Telephone Encounter (Signed)
Please advise 

## 2020-07-15 ENCOUNTER — Ambulatory Visit
Admission: RE | Admit: 2020-07-15 | Discharge: 2020-07-15 | Disposition: A | Discharge status: Home / Self Care | Payer: Medicare PPO | Source: Ambulatory Visit | Attending: Internal Medicine | Admitting: Internal Medicine

## 2020-07-15 DIAGNOSIS — N1832 Chronic kidney disease, stage 3b: Secondary | ICD-10-CM | POA: Insufficient documentation

## 2020-07-15 DIAGNOSIS — N183 Chronic kidney disease, stage 3 unspecified: Secondary | ICD-10-CM | POA: Diagnosis not present

## 2020-07-15 NOTE — Telephone Encounter (Signed)
Called and informed the patient. Unsure of which daughter to call as this was not documented. Patient's care taker Stanton Kidney received the information as well.

## 2020-07-22 ENCOUNTER — Telehealth: Payer: Self-pay | Admitting: Internal Medicine

## 2020-07-22 NOTE — Telephone Encounter (Signed)
Pt's daughter [name] called and states that mother is refusing to go to appt for vein and vascular and needs to talk to someone. Please call her back [phone]

## 2020-07-26 NOTE — Telephone Encounter (Signed)
Spoke with the Patient's daughter. The Patient is very combative about going to see the specialist. She has been scheduled for 08/02/20 to vein and vascular but is refusing to go.   Patient's daughter has explained the the patient that with her history of chronic leg clots, being on blood thinners, and the sudden increase in bilateral leg edema, Dr Olivia Mackie McLean-Scocuzza wants the Patient to be seen by specialty. Patient is refusing as she states she only trusts Dr Olivia Mackie McLean-Scocuzza and has been dismissed from vein and vascular before. Patient's daughter has tried to explain to the Patient that this is because she had gotten better but her condition has changed again.   Patient's daughter is wondering what to do and needing help as she states she can not continue to fight with her mother. She can not make the patient go and it seems that the Patient will only listen to Dr Olivia Mackie McLean-Scocuzza. Patient is extremely hard of hearing but has a caregiver with her. Daughter is wondering if something could be typed up by Dr Olivia Mackie McLean-Scocuzza to convince the Patient. Then I can call and read this to the Patient and caregiver?   Please advise

## 2020-07-28 ENCOUNTER — Other Ambulatory Visit: Payer: Self-pay | Admitting: Radiology

## 2020-07-28 ENCOUNTER — Other Ambulatory Visit: Payer: Self-pay

## 2020-07-28 DIAGNOSIS — Z20822 Contact with and (suspected) exposure to covid-19: Secondary | ICD-10-CM

## 2020-07-29 ENCOUNTER — Ambulatory Visit: Payer: Medicare PPO | Admitting: Nurse Practitioner

## 2020-07-30 ENCOUNTER — Other Ambulatory Visit: Payer: Self-pay

## 2020-07-30 DIAGNOSIS — M1990 Unspecified osteoarthritis, unspecified site: Secondary | ICD-10-CM

## 2020-07-30 LAB — SARS-COV-2, NAA 2 DAY TAT

## 2020-07-30 LAB — NOVEL CORONAVIRUS, NAA: SARS-CoV-2, NAA: NOT DETECTED

## 2020-07-30 MED ORDER — ACETAMINOPHEN 325 MG PO TABS: 650.0000 mg | ORAL_TABLET | Freq: Four times a day (QID) | ORAL | 5 refills | Status: DC | PRN

## 2020-08-02 ENCOUNTER — Ambulatory Visit (INDEPENDENT_AMBULATORY_CARE_PROVIDER_SITE_OTHER): Payer: Medicare PPO | Admitting: Nurse Practitioner

## 2020-08-12 ENCOUNTER — Other Ambulatory Visit: Payer: Self-pay

## 2020-08-12 ENCOUNTER — Encounter: Payer: Self-pay | Admitting: Internal Medicine

## 2020-08-12 ENCOUNTER — Ambulatory Visit (INDEPENDENT_AMBULATORY_CARE_PROVIDER_SITE_OTHER): Payer: Medicare PPO | Admitting: Internal Medicine

## 2020-08-12 VITALS — BP 126/80 | HR 66 | Temp 98.7°F | Ht 67.0 in | Wt 195.0 lb

## 2020-08-12 DIAGNOSIS — I890 Lymphedema, not elsewhere classified: Secondary | ICD-10-CM | POA: Diagnosis not present

## 2020-08-12 DIAGNOSIS — E669 Obesity, unspecified: Secondary | ICD-10-CM | POA: Diagnosis not present

## 2020-08-12 DIAGNOSIS — I5189 Other ill-defined heart diseases: Secondary | ICD-10-CM

## 2020-08-12 DIAGNOSIS — R600 Localized edema: Secondary | ICD-10-CM | POA: Diagnosis not present

## 2020-08-12 DIAGNOSIS — I519 Heart disease, unspecified: Secondary | ICD-10-CM

## 2020-08-12 DIAGNOSIS — Z23 Encounter for immunization: Secondary | ICD-10-CM

## 2020-08-12 DIAGNOSIS — N1832 Chronic kidney disease, stage 3b: Secondary | ICD-10-CM

## 2020-08-12 NOTE — Progress Notes (Signed)
Chief Complaint  Patient presents with  . Follow-up   F/u with Jessica Erickson with phone call to daughter nancy as well  1. Chronic lymphedema getting worse h/o CKD 3b, h/o DVT and grade 1 diastolic dysfunction on lasix 40 mg qd and may take 40 mg bid prn x 5 days worsening leg edema 2. CKD 3B seen 06/16/20 Dr. Maia Breslow f/u 09/2020   Review of Systems  Constitutional: Negative for weight loss.  HENT: Positive for hearing loss.   Respiratory: Negative for shortness of breath.   Cardiovascular: Positive for leg swelling.  Psychiatric/Behavioral: Negative for memory loss.   Past Medical History:  Diagnosis Date  . Acoustic neuroma ([place])   . Allergy   . Asthma   . Bilateral swelling of feet    and legs  . Bladder infection   . CAD (coronary artery disease)   . Cataract   . Change in voice   . Compression fracture of body of thoracic vertebra (HCC)    T12 09/18/15 MRI s/p fall   . Constipation   . COPD (chronic obstructive pulmonary disease) ([place])    previous CXR with chronic interstitial lung dz   . CVA (cerebral vascular accident) ([place])   . Depression   . Diabetes ([place])    with neuropathy  . Diabetes mellitus, type 2 ([place])   . Diarrhea   . Double vision   . DVT (deep venous thrombosis) ([place])    right leg 10/2015 was on coumadin off as of 2017/2018 ; s/p IVC filter  . Enuresis   . Eye pain, right   . Fall   . Fatty liver    09/15/15 also mildly dilated pancreatitic duct rec MRCP small sub cm cyst hemangioma speeln mild right hydronephrorossi and prox. hydroureter, kidney stones, mild scarring kidneys  . Female stress incontinence   . Flank pain   . GERD (gastroesophageal reflux disease)    with small hiatal hernia   . Hard of hearing   . Heart disease   . History of kidney problems   . Hyperlipidemia    mixed  . Hypertension   . Hypothyroidism, postsurgical   . Impaired mobility and ADLs    uses rolling walker has caretaker 24/7 at home  . Leg edema   . Mixed incontinence  urge and stress (female)(female)   . Neuropathy   . Osteoarthritis    DDD spine   . Osteoporosis with fracture    T12 compression fracture  . Photophobia   . Pulmonary embolism ([place])    10/2015 off coumadin as of 04/2016  . Pulmonary HTN (HCC)    mild pulm HTN, echo 10/09/15 EF 49-17%HXTAV 1 dd, RV systolic pressure increased   . Recurrent UTI   . Sinus pressure   . Skin cancer    BCC jawline and scalp   . Thyroid disease    follows [facility]  . TIA (transient ischemic attack)    MRI 2009/2010 neg stroke   . Trigeminal neuralgia    Dr. Tomi Bamberger s/p gamma knife x 2, on Tegretol since 2011/2012 no increase in dose >200 mg bid rec per family per neurology   . Urinary frequency   . Urinary, incontinence, stress female    Dr Erlene Quan urology    Past Surgical History:  Procedure Laterality Date  . APPENDECTOMY     as a child, open  . BRAIN SURGERY     schwnnoma removal 1996   . brain tumor surgery    .  BREAST SURGERY     breast bx  . CATARACT EXTRACTION    . CHOLECYSTECTOMY    . EYE SURGERY     cataract  . IVC FILTER PLACEMENT ([facility] HX)     Dr. Lucky Cowboy 10/2015   . LAPAROSCOPIC TUBAL LIGATION    . MOHS SURGERY     scalp 04/2014   . PERIPHERAL VASCULAR CATHETERIZATION N/A 10/11/2015   Procedure: IVC Filter Insertion;  Surgeon: Algernon Huxley, MD;  Location: [facility];  Service: Cardiovascular;  Laterality: N/A;  . PERIPHERAL VASCULAR THROMBECTOMY Bilateral 03/29/2018   Procedure: PERIPHERAL VASCULAR THROMBECTOMY;  Surgeon: Algernon Huxley, MD;  Location: [facility];  Service: Cardiovascular;  Laterality: Bilateral;  . PUBOVAGINAL SLING    . THROAT SURGERY    . THYROID SURGERY     tumor around vocal cords   . TOOTH EXTRACTION     winter 2018   . TOTAL THYROIDECTOMY  1976   Family History  Problem Relation Age of Onset  . Heart disease Mother   . Diabetes Father   . Cancer Daughter        breast ca x 2 s/p mastectomy    Social History   Socioeconomic  History  . Marital status: Widowed    Spouse name: Not on file  . Number of children: 5  . Years of education: Not on file  . Highest education level: Not on file  Occupational History  . Occupation: retired  Tobacco Use  . Smoking status: Former Smoker    Packs/day: 0.50    Years: 20.00    Pack years: 10.00    Types: Cigarettes    Quit date: 09/20/1995    Years since quitting: 24.9  . Smokeless tobacco: Never Used  . Tobacco comment: quit 1996 smoked 20 years max 8 cig qd   Vaping Use  . Vaping Use: Never used  Substance and Sexual Activity  . Alcohol use: No  . Drug use: No  . Sexual activity: Not on file  Other Topics Concern  . Not on file  Social History Narrative   Lives at home with caretaker currently [name]    Has children    Likes to ride stationary bike and sew             Social Determinants of Health   Financial Resource Strain:   . Difficulty of Paying Living Expenses: Not on file  Food Insecurity:   . Worried About Charity fundraiser in the Last Year: Not on file  . Ran Out of Food in the Last Year: Not on file  Transportation Needs:   . Lack of Transportation (Medical): Not on file  . Lack of Transportation (Non-Medical): Not on file  Physical Activity: Sufficiently Active  . Days of Exercise per Week: 4 days  . Minutes of Exercise per Session: 40 min  Stress:   . Feeling of Stress : Not on file  Social Connections:   . Frequency of Communication with Friends and Family: Not on file  . Frequency of Social Gatherings with Friends and Family: Not on file  . Attends Religious Services: Not on file  . Active Member of Clubs or Organizations: Not on file  . Attends Archivist Meetings: Not on file  . Marital Status: Not on file  Intimate Partner Violence:   . Fear of Current or Ex-Partner: Not on file  . Emotionally Abused: Not on file  . Physically Abused: Not on  file  . Sexually Abused: Not on file   Current Meds  Medication Sig   . acetaminophen (TYLENOL) 325 MG tablet Take 2 tablets (650 mg total) by mouth every 6 (six) hours as needed.  Marland Kitchen acetaminophen-codeine (TYLENOL #3) 300-30 MG tablet Take 1 tablet by mouth 2 (two) times daily as needed for moderate pain.  Marland Kitchen albuterol (PROVENTIL) (2.5 MG/3ML) 0.083% nebulizer solution Take 2.5 mg by nebulization 3 (three) times daily.  . AMBULATORY NON FORMULARY MEDICATION Medication Name: Please dispense flutter valve to use three times daily after nebulization. DX:J47.9  . apixaban (ELIQUIS) 2.5 MG TABS tablet Take 1 tablet (2.5 mg total) by mouth 2 (two) times daily.  Marland Kitchen atorvastatin (LIPITOR) 40 MG tablet Take 1 tablet (40 mg total) by mouth daily at 6 PM. Generic ok  . bisacodyl (DULCOLAX) 5 MG EC tablet Take 1 tablet (5 mg total) by mouth daily as needed for moderate constipation.  . Calcium Carbonate-Vitamin D (CALCIUM 600+D) 600-400 MG-UNIT tablet Take 1 tablet by mouth 2 (two) times daily. Lunch and dinner  . cetirizine (ZYRTEC) 10 MG tablet Take 1 tablet (10 mg total) by mouth daily.  Marland Kitchen DOK 100 MG capsule TAKE 1 CAPSULE BY MOUTH TWICE DAILY  . fluticasone (FLONASE) 50 MCG/ACT nasal spray Place 1-2 sprays into both nostrils daily. Max 2 sprays  . Fluticasone-Umeclidin-Vilant (TRELEGY ELLIPTA) 100-62.5-25 MCG/INH AEPB Inhale 1 puff into the lungs daily. Samples of this drug were given to the patient, quantity 1/14 doses, Lot Number [id number] nov 22 exp  . Fluticasone-Umeclidin-Vilant (TRELEGY ELLIPTA) 100-62.5-25 MCG/INH AEPB Inhale 1 puff into the lungs daily. Rinse mouth d/c symbicort and spiriva  . furosemide (LASIX) 40 MG tablet Take 1 tablet (40 mg total) by mouth daily. May take 2nd dose 40 mg if needed at lunch for 5 days as needed  . gabapentin (NEURONTIN) 100 MG capsule Take 2 capsules (200 mg total) by mouth 2 (two) times daily. (Patient taking differently: Take 200 mg by mouth 3 (three) times daily. )  . guaiFENesin (MUCINEX) 600 MG 12 hr tablet Take 600 mg by mouth  daily.   . hydrocortisone 2.5 % cream Apply topically 2 (two) times daily.  Marland Kitchen ipratropium-albuterol (DUONEB) 0.5-2.5 (3) MG/3ML SOLN Inhale 3 mLs into the lungs every 4 (four) hours as needed.  Marland Kitchen levothyroxine (SYNTHROID) 200 MCG tablet Take 1 tablet (200 mcg total) by mouth daily before breakfast. Except on Sunday. Do not take with other medications or vitamins  . Melatonin 10 MG TABS Take by mouth.   . mirtazapine (REMERON) 15 MG tablet Take 1 tablet (15 mg total) by mouth at bedtime.  . Multiple Vitamin (MULTIVITAMIN WITH MINERALS) TABS tablet Take 1 tablet by mouth daily.  . pantoprazole (PROTONIX) 40 MG tablet Take 1 tablet (40 mg total) by mouth daily. 30 minutes before lunch or dinner  . polyethylene glycol powder (GLYCOLAX/MIRALAX) 17 GM/SCOOP powder Take 17 g by mouth daily.  . polyvinyl alcohol (LIQUIFILM TEARS) 1.4 % ophthalmic solution Place 1 drop into both eyes as needed for dry eyes.  . sitaGLIPtin (JANUVIA) 50 MG tablet Take 1 tablet (50 mg total) by mouth daily.   Allergies  Allergen Reactions  . Penicillins Shortness Of Breath, Rash and Other (See Comments)    Has patient had a PCN reaction causing immediate rash, facial/tongue/throat swelling, SOB or lightheadedness with hypotension: Yes Has patient had a PCN reaction causing severe rash involving mucus membranes or skin necrosis: No Has patient had a PCN reaction that  required hospitalization No Has patient had a PCN reaction occurring within the last 10 years: No If all of the above answers are "NO", then may proceed with Cephalosporin use.  . Sulfa Antibiotics Shortness Of Breath, Rash and Other (See Comments)  . Amitiza [Lubiprostone]     N/v/d  . Aspirin Other (See Comments)    Reaction:  Unknown  Other reaction(s): Bleeding (intolerance) Per patient " causes nose to bleed" Can take 81 mg daily without any complications Other reaction(s): "bloody nose"   . Penicillin G Other (See Comments)    Has patient had a  PCN reaction causing immediate rash, facial/tongue/throat swelling, SOB or lightheadedness with hypotension: No Has patient had a PCN reaction causing severe rash involving mucus membranes or skin necrosis: Unknown Has patient had a PCN reaction that required hospitalization: Unknown Has patient had a PCN reaction occurring within the last 10 years: Unknown If all of the above answers are "NO", then may proceed with Cephalosporin use.   Recent Results (from the past 2160 hour(s))  Urinalysis, Routine w reflex microscopic     Status: Abnormal   Collection Time: 07/06/20  2:38 PM  Result Value Ref Range   Color, Urine YELLOW YELLOW   APPearance CLEAR CLEAR   Specific Gravity, Urine 1.009 1.001 - 1.03   pH 6.5 5.0 - 8.0   Glucose, UA NEGATIVE NEGATIVE   Bilirubin Urine NEGATIVE NEGATIVE   Ketones, ur NEGATIVE NEGATIVE   Hgb urine dipstick NEGATIVE NEGATIVE   Protein, ur NEGATIVE NEGATIVE   Nitrite NEGATIVE NEGATIVE   Leukocytes,Ua 2+ (A) NEGATIVE   WBC, UA 6-10 (A) 0 - 5 /HPF   RBC / HPF NONE SEEN 0 - 2 /HPF   Squamous Epithelial / LPF NONE SEEN < OR = 5 /HPF   Bacteria, UA NONE SEEN NONE SEEN /HPF   Hyaline Cast NONE SEEN NONE SEEN /LPF  Urine Culture     Status: None   Collection Time: 07/06/20  2:38 PM   Specimen: Urine  Result Value Ref Range   MICRO NUMBER: [id number]    SPECIMEN QUALITY: Adequate    Sample Source URINE    STATUS: FINAL    Result:      Growth of mixed flora was isolated, suggesting probable contamination. No further testing will be performed. If clinically indicated, recollection using a method to minimize contamination, with prompt transfer to Urine Culture Transport Tube, is  recommended.   CBC with Differential/Platelet     Status: None   Collection Time: 07/06/20  2:52 PM  Result Value Ref Range   WBC 8.3 4.0 - 10.5 K/uL   RBC 4.67 3.87 - 5.11 Mil/uL   Hemoglobin 14.5 12.0 - 15.0 g/dL   HCT 44.1 36 - 46 %   MCV 94.4 78.0 - 100.0 fl   MCHC 32.9 30.0 -  36.0 g/dL   RDW 15.0 11.5 - 15.5 %   Platelets 191.0 150 - 400 K/uL   Neutrophils Relative % 67.6 43 - 77 %   Lymphocytes Relative 21.4 12 - 46 %   Monocytes Relative 8.2 3 - 12 %   Eosinophils Relative 2.0 0 - 5 %   Basophils Relative 0.8 0 - 3 %   Neutro Abs 5.6 1.4 - 7.7 K/uL   Lymphs Abs 1.8 0.7 - 4.0 K/uL   Monocytes Absolute 0.7 0 - 1 K/uL   Eosinophils Absolute 0.2 0 - 0 K/uL   Basophils Absolute 0.1 0 - 0 K/uL  NT-proBNP  Status: None   Collection Time: 07/06/20  2:52 PM  Result Value Ref Range   Pro B Natriuretic peptide (BNP) 284 pg/mL    Comment: . For Heart Failure (HF) diagnosis, reference ranges in patients with dyspnea are based on Januzzi JL, Et al. Am Coll Cardiol. 1610;96:0454-0981. Marland Kitchen 18-49 years: <= 300 pg/mL Normal, HF unlikely >= 450 pg/mL High probability of HF 50-75 years: <= 300 pg/mL Normal, HF unlikely >= 900 pg/mL High probability of HF >75 years: <= 300 pg/mL Normal, HF unlikely >= 1800 pg/mL High probability of HF . For patients with coronary heart disease, the optimal risk category cut points for incident HF or CVD death (<253 pg/mL men, <372 pg/mL women) are based on Omland T, et al. J Am Coll Cardiol. 1914:78:295-62. . For patients with existing HF, the optimal risk category cut point for HF progression (<300 pg/mL) is based on Binnie Kand, et al. Clin Biochem. 2010;43:1405-10. . For additional information, please refer to http://education.questdiagnostics.com/faq/FAQ202 (This link is being provided for informational/ educational purposes only.) .   Comprehensive metabolic panel     Status: Abnormal   Collection Time: 07/06/20  2:52 PM  Result Value Ref Range   Sodium 142 135 - 145 mEq/L   Potassium 4.3 3.5 - 5.1 mEq/L   Chloride 103 96 - 112 mEq/L   CO2 33 (H) 19 - 32 mEq/L   Glucose, Bld 93 70 - 99 mg/dL   BUN 18 6 - 23 mg/dL   Creatinine, Ser 1.36 (H) 0.40 - 1.20 mg/dL   Total Bilirubin 0.6 0.2 - 1.2 mg/dL   Alkaline  Phosphatase 62 39 - 117 U/L   AST 21 0 - 37 U/L   ALT 15 0 - 35 U/L   Total Protein 6.7 6.0 - 8.3 g/dL   Albumin 3.9 3.5 - 5.2 g/dL   GFR 36.35 (L) >60.00 mL/min   Calcium 9.2 8.4 - 10.5 mg/dL  Hemoglobin A1c     Status: None   Collection Time: 07/06/20  2:52 PM  Result Value Ref Range   Hgb A1c MFr Bld 6.1 4.6 - 6.5 %    Comment: Glycemic Control Guidelines for People with Diabetes:Non Diabetic:  <6%Goal of Therapy: <7%Additional Action Suggested:  >8%   POCT occult blood stool     Status: Normal   Collection Time: 07/06/20  3:00 PM  Result Value Ref Range   Fecal Occult Blood, POC     Card #1 Date     Card #2 Fecal Occult Blod, POC     Card #2 Date     Card #3 Fecal Occult Blood, POC     Card #3 Date    Comprehensive metabolic panel     Status: Abnormal   Collection Time: 07/09/20 12:34 PM  Result Value Ref Range   Sodium 142 135 - 145 mmol/L   Potassium 4.3 3.5 - 5.1 mmol/L   Chloride 102 98 - 111 mmol/L   CO2 31 22 - 32 mmol/L   Glucose, Bld 115 (H) 70 - 99 mg/dL    Comment: Glucose reference range applies only to samples taken after fasting for at least 8 hours.   BUN 20 8 - 23 mg/dL   Creatinine, Ser 1.35 (H) 0.44 - 1.00 mg/dL   Calcium 8.7 (L) 8.9 - 10.3 mg/dL   Total Protein 7.3 6.5 - 8.1 g/dL   Albumin 3.9 3.5 - 5.0 g/dL   AST 26 15 - 41 U/L   ALT 17 0 -  44 U/L   Alkaline Phosphatase 64 38 - 126 U/L   Total Bilirubin 0.9 0.3 - 1.2 mg/dL   GFR calc non Af Amer 34 (L) >60 mL/min   GFR calc Af Amer 39 (L) >60 mL/min   Anion gap 9 5 - 15    Comment: Performed at [facility], [address] [place], [place] [address]  CBC with Differential/Platelet     Status: None   Collection Time: 07/09/20 12:34 PM  Result Value Ref Range   WBC 9.1 4.0 - 10.5 K/uL   RBC 4.68 3.87 - 5.11 MIL/uL   Hemoglobin 14.3 12.0 - 15.0 g/dL   HCT 43.8 36 - 46 %   MCV 93.6 80.0 - 100.0 fL   MCH 30.6 26.0 - 34.0 pg   MCHC 32.6 30.0 - 36.0 g/dL   RDW 14.2 11.5 - 15.5 %    Platelets 181 150 - 400 K/uL   nRBC 0.0 0.0 - 0.2 %   Neutrophils Relative % 68 %   Neutro Abs 6.1 1.7 - 7.7 K/uL   Lymphocytes Relative 21 %   Lymphs Abs 1.9 0.7 - 4.0 K/uL   Monocytes Relative 8 %   Monocytes Absolute 0.8 0 - 1 K/uL   Eosinophils Relative 2 %   Eosinophils Absolute 0.2 0 - 0 K/uL   Basophils Relative 0 %   Basophils Absolute 0.0 0 - 0 K/uL   Immature Granulocytes 1 %   Abs Immature Granulocytes 0.05 0.00 - 0.07 K/uL    Comment: Performed at [facility], [address] [place], [place] [address]   Objective  Body mass index is 30.54 kg/m. Wt Readings from Last 3 Encounters:  08/12/20 195 lb (88.5 kg)  07/09/20 197 lb 6.4 oz (89.5 kg)  07/06/20 195 lb 6.4 oz (88.6 kg)   Temp Readings from Last 3 Encounters:  08/12/20 98.7 F (37.1 C) (Oral)  07/09/20 98 F (36.7 C)  07/06/20 98 F (36.7 C) (Oral)   BP Readings from Last 3 Encounters:  08/12/20 126/80  07/09/20 118/74  07/06/20 118/84   Pulse Readings from Last 3 Encounters:  08/12/20 66  07/09/20 76  07/06/20 78    Physical Exam Vitals and nursing note reviewed.  Constitutional:      Appearance: Normal appearance. She is well-developed and well-groomed. She is obese.  HENT:     Head: Normocephalic and atraumatic.  Eyes:     Conjunctiva/sclera: Conjunctivae normal.     Pupils: Pupils are equal, round, and reactive to light.  Cardiovascular:     Rate and Rhythm: Normal rate and regular rhythm.     Heart sounds: Normal heart sounds. No murmur heard.   Pulmonary:     Effort: Pulmonary effort is normal.     Breath sounds: Normal breath sounds.  Musculoskeletal:     Right lower leg: 2+ Pitting Edema present.     Left lower leg: 2+ Pitting Edema present.  Skin:    General: Skin is warm and dry.  Neurological:     General: No focal deficit present.     Mental Status: She is alert and oriented to person, place, and time. Mental status is at baseline.     Comments: +rollator   Psychiatric:        Attention and Perception: Attention and perception normal.        Mood and Affect: Mood and affect normal.        Speech: Speech normal.  Behavior: Behavior normal. Behavior is cooperative.        Thought Content: Thought content normal.        Cognition and Memory: Cognition and memory normal.        Judgment: Judgment normal.     Assessment  Plan  Bilateral leg edema - Plan: ECHOCARDIOGRAM COMPLETE, Ambulatory referral to Vascular Surgery  Stage 3b chronic kidney disease F/u renal appt 09/2020   Grade I diastolic dysfunction - Plan: ECHOCARDIOGRAM COMPLETE On lasix 40 mg qd   Lymphedema chronic with ho DVT - Plan: Ambulatory referral to Vascular Surgery wants to see Dr. Delana Meyer instead of Dr. Lucky Cowboy  Obesity (BMI 30-39.9)  -rec healthy diet and cont bike stationary bike with exercise   HM Flu10/27/20utd given flu shot today prevnar had 09/20/16 pna 23 08/29/18 Tdap 08/04/16 shingrixnot had out of stock called pharmacy,? zostavaxif had MMR immune covid 2nd2/22nd dose due in 09/03/20   Estdermatologywill see 7 or 07/2020 Dr. Evorn Gong Out of age window pap, colonoscopy, mammo DEXA 01/01/08 osteopeniawill make sure on ca 600 bid and vit D 1000 iu qd -Vitamin d 03/15/18 48.1 -disc with patient and family at f/u if want to [facility] futureDEXA   Provider: Dr. Olivia Mackie McLean-Scocuzza-Internal Medicine

## 2020-08-16 ENCOUNTER — Telehealth: Payer: Self-pay | Admitting: Internal Medicine

## 2020-08-16 ENCOUNTER — Ambulatory Visit: Payer: Medicare PPO | Admitting: Podiatry

## 2020-08-16 DIAGNOSIS — I5189 Other ill-defined heart diseases: Secondary | ICD-10-CM | POA: Insufficient documentation

## 2020-08-16 DIAGNOSIS — E669 Obesity, unspecified: Secondary | ICD-10-CM | POA: Insufficient documentation

## 2020-08-16 NOTE — Telephone Encounter (Signed)
Call pt and see if any BP meds on her list at pharmacy I.e ACEI, clonidine, any other from docs at [facility]?  What dose of lasix is she picking up ?

## 2020-08-17 ENCOUNTER — Telehealth: Payer: Self-pay | Admitting: Internal Medicine

## 2020-08-17 NOTE — Telephone Encounter (Signed)
She was agreeable to see Schnier not Dew at appt and [name] caretaker and daughter [name] was witness

## 2020-08-17 NOTE — Telephone Encounter (Signed)
Left message to return call 

## 2020-08-17 NOTE — Telephone Encounter (Signed)
Rejection Reason - Other - this is a duplicate. we have scheduled patient and dtr states she refuses to come to the appt" [facility] PA said about 1 hour ago  Duplicate request. Per daughter patient refuses to come to appointment

## 2020-08-17 NOTE — Telephone Encounter (Signed)
Patient was returning call about medication

## 2020-08-17 NOTE — Telephone Encounter (Signed)
Inform d/c spiriva she only needs to be on trelegy 1x per day rinse mouth

## 2020-08-17 NOTE — Telephone Encounter (Signed)
Called and spoke with patient's caregiver. Patient is taking 40 mg Lasix in the morning.  Went over patient's medications and updated in chart, all none taking medications are as needed.   No blood pressure medications have been added.

## 2020-08-17 NOTE — Telephone Encounter (Signed)
Patient and caregiver informed and verbalized understanding.

## 2020-08-20 NOTE — Telephone Encounter (Signed)
Noted  

## 2020-08-20 NOTE — Telephone Encounter (Signed)
I spoke with Destiny at [facility] and note states that per [name] pt refuses to come to appt. Appt was on 07/16/2020 and was cancelled on 07/27/2020.  Destiny will try and schedule according to note from Dr Aundra Dubin.  Thanks,

## 2020-08-26 ENCOUNTER — Other Ambulatory Visit: Payer: Self-pay | Admitting: Internal Medicine

## 2020-08-26 DIAGNOSIS — Z86718 Personal history of other venous thrombosis and embolism: Secondary | ICD-10-CM

## 2020-08-26 MED ORDER — APIXABAN 2.5 MG PO TABS: 2.5000 mg | ORAL_TABLET | Freq: Two times a day (BID) | ORAL | 3 refills | Status: DC

## 2020-09-01 ENCOUNTER — Telehealth: Payer: Self-pay | Admitting: Internal Medicine

## 2020-09-01 ENCOUNTER — Other Ambulatory Visit: Payer: Self-pay | Admitting: Internal Medicine

## 2020-09-01 DIAGNOSIS — E119 Type 2 diabetes mellitus without complications: Secondary | ICD-10-CM

## 2020-09-01 MED ORDER — SITAGLIPTIN PHOSPHATE 25 MG PO TABS: 25.0000 mg | ORAL_TABLET | Freq: Every day | ORAL | 3 refills | Status: DC

## 2020-09-01 NOTE — Telephone Encounter (Signed)
I think 25 mg is best given her kidney function  Resent to pharmacy  Call nancy and inform

## 2020-09-01 NOTE — Telephone Encounter (Signed)
Please advise, should Patient still be taking 50?

## 2020-09-01 NOTE — Telephone Encounter (Signed)
Pt's daughter [name] called and needs clarification on dosage of Januvia. She states that she thought pt was supposed to be taking 1/2 tablet (25mg ) daily but pharmacy states that she is supposed to be taking 1 tablet daily (50 mg). Please call her @ [phone]

## 2020-09-02 NOTE — Telephone Encounter (Signed)
Patients daughter informed and verbalized understanding

## 2020-09-10 ENCOUNTER — Ambulatory Visit: Payer: Medicare PPO | Attending: Internal Medicine

## 2020-09-13 ENCOUNTER — Ambulatory Visit: Payer: Medicare PPO | Attending: Internal Medicine

## 2020-09-13 DIAGNOSIS — Z23 Encounter for immunization: Secondary | ICD-10-CM

## 2020-09-13 NOTE — Progress Notes (Signed)
   Covid-19 Vaccination Clinic  Name:  JLA REYNOLDS    MRN: [id number] DOB: November 19, 1926  09/13/2020  Ms. Fiser was observed post Covid-19 immunization for 15 minutes without incident. She was provided with Vaccine Information Sheet and instruction to access the V-Safe system.   Ms. Butzin was instructed to call 911 with any severe reactions post vaccine: Marland Kitchen Difficulty breathing  . Swelling of face and throat  . A fast heartbeat  . A bad rash all over body  . Dizziness and weakness

## 2020-09-16 ENCOUNTER — Telehealth: Payer: Self-pay

## 2020-09-16 ENCOUNTER — Telehealth: Payer: Self-pay | Admitting: Internal Medicine

## 2020-09-16 NOTE — Telephone Encounter (Signed)
Patient's daughter called in wanting a statement stating that patient is medically clear to have dental work done at 8:30am Friday 09-17-20 she has been of apixaban (ELIQUIS) 2.5 MG TABS tablet for some days now need this email to [email or web address]

## 2020-09-16 NOTE — Telephone Encounter (Signed)
No blood thinner today and start back after 24 hours after tooth pulled  Prob should have stopped 2 days prior to tooth pulling

## 2020-09-16 NOTE — Telephone Encounter (Signed)
Please advise 

## 2020-09-16 NOTE — Telephone Encounter (Signed)
Pt's daughter called and states that pt is having a tooth pulled tomorrow morning at 8am and she needs to know when to stop blood thinner and when to start it back. Please call asap to advise.

## 2020-09-16 NOTE — Telephone Encounter (Signed)
For your information,  Daughter states that Patient is having a flare up of her Trigeminal neuralgia.  Patient is now on gabapentin 400mg  three times daily. After her surgery she will be taking 300mg  three times daily.

## 2020-09-16 NOTE — Telephone Encounter (Signed)
Patient's daughter informed and verbalized understanding.  Patient has not taken her medication today.

## 2020-09-17 NOTE — Telephone Encounter (Signed)
Spoke with Patient's daughter nancy and informed her that we do not e-mail. The dentist office should have faxed [place] a clearance form for Dr Olivia Mackie McLean-Scocuzza to sign.   Gave her our fax number and my name. She will have the dentist fax this over urgently

## 2020-09-20 NOTE — Telephone Encounter (Signed)
Kept an eye out all day on Friday and faxed never did come through

## 2020-09-22 DIAGNOSIS — E1122 Type 2 diabetes mellitus with diabetic chronic kidney disease: Secondary | ICD-10-CM | POA: Diagnosis not present

## 2020-09-22 DIAGNOSIS — N1832 Chronic kidney disease, stage 3b: Secondary | ICD-10-CM | POA: Diagnosis not present

## 2020-09-22 DIAGNOSIS — I10 Essential (primary) hypertension: Secondary | ICD-10-CM | POA: Diagnosis not present

## 2020-09-23 ENCOUNTER — Other Ambulatory Visit: Payer: Self-pay

## 2020-09-23 DIAGNOSIS — E039 Hypothyroidism, unspecified: Secondary | ICD-10-CM

## 2020-09-23 MED ORDER — LEVOTHYROXINE SODIUM 200 MCG PO TABS: 200.0000 ug | ORAL_TABLET | Freq: Every day | ORAL | 3 refills | Status: DC

## 2020-09-29 ENCOUNTER — Encounter (INDEPENDENT_AMBULATORY_CARE_PROVIDER_SITE_OTHER): Payer: Self-pay

## 2020-09-29 ENCOUNTER — Ambulatory Visit (INDEPENDENT_AMBULATORY_CARE_PROVIDER_SITE_OTHER): Payer: Medicare PPO

## 2020-09-29 VITALS — Ht 67.0 in | Wt 195.0 lb

## 2020-09-29 DIAGNOSIS — Z0000 Encounter for general adult medical examination without abnormal findings: Secondary | ICD-10-CM

## 2020-09-29 NOTE — Progress Notes (Signed)
Subjective:   Jessica Erickson is a 86 y.o. female who presents for Medicare Annual (Subsequent) preventive examination.  Review of Systems    No ROS.  Medicare Wellness Virtual Visit.   Cardiac Risk Factors include: advanced age (>74men, >4 women);hypertension;diabetes mellitus     Objective:    Today's Vitals   09/29/20 1133  Weight: 195 lb (88.5 kg)  Height: 5\' 7"  (1.702 m)   Body mass index is 30.54 kg/m.  Advanced Directives 09/29/2020 03/09/2020 09/29/2019 07/28/2019 01/28/2019 09/19/2018 07/30/2018  Does Patient Have a Medical Advance Directive? Yes No Yes Yes Yes Yes Yes  Type of Paramedic of [place];Living will - Healthcare Power of [place];Living will [place];Living will Living will;Healthcare Power of Attorney -  Does patient want to make changes to medical advance directive? No - Patient declined - No - Patient declined - - No - Patient declined -  Copy of [place] in Chart? No - copy requested - No - copy requested - No - copy requested No - copy requested -  Would patient like information on creating a medical advance directive? - - - - - - -    Current Medications (verified) Outpatient Encounter Medications as of 09/29/2020  Medication Sig  . acetaminophen (TYLENOL) 325 MG tablet Take 2 tablets (650 mg total) by mouth every 6 (six) hours as needed. (Patient not taking: Reported on 08/17/2020)  . acetaminophen-codeine (TYLENOL #3) 300-30 MG tablet Take 1 tablet by mouth 2 (two) times daily as needed for moderate pain. (Patient not taking: Reported on 08/17/2020)  . albuterol (PROVENTIL) (2.5 MG/3ML) 0.083% nebulizer solution Take 2.5 mg by nebulization 3 (three) times daily. (Patient not taking: Reported on 08/17/2020)  . AMBULATORY NON FORMULARY MEDICATION Medication Name: Please dispense flutter valve to use three times daily after nebulization. DX:J47.9  . apixaban (ELIQUIS)  2.5 MG TABS tablet Take 1 tablet (2.5 mg total) by mouth 2 (two) times daily.  Marland Kitchen atorvastatin (LIPITOR) 40 MG tablet Take 1 tablet (40 mg total) by mouth daily at 6 PM. Generic ok  . bisacodyl (DULCOLAX) 5 MG EC tablet Take 1 tablet (5 mg total) by mouth daily as needed for moderate constipation. (Patient not taking: Reported on 08/17/2020)  . Calcium Carbonate-Vitamin D (CALCIUM 600+D) 600-400 MG-UNIT tablet Take 1 tablet by mouth 2 (two) times daily. Lunch and dinner  . cetirizine (ZYRTEC) 10 MG tablet Take 1 tablet (10 mg total) by mouth daily.  Marland Kitchen DOK 100 MG capsule TAKE 1 CAPSULE BY MOUTH TWICE DAILY (Patient not taking: Reported on 08/17/2020)  . fluticasone (FLONASE) 50 MCG/ACT nasal spray Place 1-2 sprays into both nostrils daily. Max 2 sprays (Patient not taking: Reported on 08/17/2020)  . Fluticasone-Umeclidin-Vilant (TRELEGY ELLIPTA) 100-62.5-25 MCG/INH AEPB Inhale 1 puff into the lungs daily. Samples of this drug were given to the patient, quantity 1/14 doses, Lot Number [id number] nov 22 exp (Patient not taking: Reported on 08/17/2020)  . Fluticasone-Umeclidin-Vilant (TRELEGY ELLIPTA) 100-62.5-25 MCG/INH AEPB Inhale 1 puff into the lungs daily. Rinse mouth d/c symbicort and spiriva  . furosemide (LASIX) 40 MG tablet Take 1 tablet (40 mg total) by mouth daily. May take 2nd dose 40 mg if needed at lunch for 5 days as needed  . gabapentin (NEURONTIN) 100 MG capsule Take 2 capsules (200 mg total) by mouth 2 (two) times daily. (Patient taking differently: Take 400 mg by mouth 3 (three) times daily. )  . guaiFENesin (  MUCINEX) 600 MG 12 hr tablet Take 600 mg by mouth daily.  (Patient not taking: Reported on 08/17/2020)  . hydrocortisone 2.5 % cream Apply topically 2 (two) times daily. (Patient not taking: Reported on 08/17/2020)  . ipratropium-albuterol (DUONEB) 0.5-2.5 (3) MG/3ML SOLN Inhale 3 mLs into the lungs every 4 (four) hours as needed. (Patient not taking: Reported on 08/17/2020)  . levothyroxine  (SYNTHROID) 200 MCG tablet Take 1 tablet (200 mcg total) by mouth daily before breakfast. Except on Sunday. Do not take with other medications or vitamins  . Melatonin 10 MG TABS Take by mouth.   . mirtazapine (REMERON) 15 MG tablet Take 1 tablet (15 mg total) by mouth at bedtime.  . Multiple Vitamin (MULTIVITAMIN WITH MINERALS) TABS tablet Take 1 tablet by mouth daily. (Patient not taking: Reported on 08/17/2020)  . pantoprazole (PROTONIX) 40 MG tablet Take 1 tablet (40 mg total) by mouth daily. 30 minutes before lunch or dinner (Patient not taking: Reported on 08/17/2020)  . polyethylene glycol powder (GLYCOLAX/MIRALAX) 17 GM/SCOOP powder Take 17 g by mouth daily. (Patient not taking: Reported on 08/17/2020)  . polyvinyl alcohol (LIQUIFILM TEARS) 1.4 % ophthalmic solution Place 1 drop into both eyes as needed for dry eyes. (Patient not taking: Reported on 08/17/2020)  . sitaGLIPtin (JANUVIA) 25 MG tablet Take 1 tablet (25 mg total) by mouth daily.   No facility-administered encounter medications on file as of 09/29/2020.    Allergies (verified) Penicillins, Sulfa antibiotics, Amitiza [lubiprostone], Aspirin, and Penicillin g   History: Past Medical History:  Diagnosis Date  . Acoustic neuroma ([place])   . Allergy   . Asthma   . Bilateral swelling of feet    and legs  . Bladder infection   . CAD (coronary artery disease)   . Cataract   . Change in voice   . Compression fracture of body of thoracic vertebra (HCC)    T12 09/18/15 MRI s/p fall   . Constipation   . COPD (chronic obstructive pulmonary disease) ([place])    previous CXR with chronic interstitial lung dz   . CVA (cerebral vascular accident) ([place])   . Depression   . Diabetes ([place])    with neuropathy  . Diabetes mellitus, type 2 ([place])   . Diarrhea   . Double vision   . DVT (deep venous thrombosis) ([place])    right leg 10/2015 was on coumadin off as of 2017/2018 ; s/p IVC filter  . Enuresis   . Eye pain, right   . Fall   .  Fatty liver    09/15/15 also mildly dilated pancreatitic duct rec MRCP small sub cm cyst hemangioma speeln mild right hydronephrorossi and prox. hydroureter, kidney stones, mild scarring kidneys  . Female stress incontinence   . Flank pain   . GERD (gastroesophageal reflux disease)    with small hiatal hernia   . Hard of hearing   . Heart disease   . History of kidney problems   . Hyperlipidemia    mixed  . Hypertension   . Hypothyroidism, postsurgical   . Impaired mobility and ADLs    uses rolling walker has caretaker 24/7 at home  . Leg edema   . Mixed incontinence urge and stress (female)(female)   . Neuropathy   . Osteoarthritis    DDD spine   . Osteoporosis with fracture    T12 compression fracture  . Photophobia   . Pulmonary embolism ([place])    10/2015 off coumadin as of 04/2016  . Pulmonary  HTN ([place])    mild pulm HTN, echo 10/09/15 EF 51-02%HENID 1 dd, RV systolic pressure increased   . Recurrent UTI   . Sinus pressure   . Skin cancer    BCC jawline and scalp   . Thyroid disease    follows [facility]  . TIA (transient ischemic attack)    MRI 2009/2010 neg stroke   . Trigeminal neuralgia    Dr. Tomi Bamberger s/p gamma knife x 2, on Tegretol since 2011/2012 no increase in dose >200 mg bid rec per family per neurology   . Urinary frequency   . Urinary, incontinence, stress female    Dr Erlene Quan urology    Past Surgical History:  Procedure Laterality Date  . APPENDECTOMY     as a child, open  . BRAIN SURGERY     schwnnoma removal 1996   . brain tumor surgery    . BREAST SURGERY     breast bx  . CATARACT EXTRACTION    . CHOLECYSTECTOMY    . EYE SURGERY     cataract  . IVC FILTER PLACEMENT ([facility] HX)     Dr. Lucky Cowboy 10/2015   . LAPAROSCOPIC TUBAL LIGATION    . MOHS SURGERY     scalp 04/2014   . PERIPHERAL VASCULAR CATHETERIZATION N/A 10/11/2015   Procedure: IVC Filter Insertion;  Surgeon: Algernon Huxley, MD;  Location: [facility];  Service: Cardiovascular;   Laterality: N/A;  . PERIPHERAL VASCULAR THROMBECTOMY Bilateral 03/29/2018   Procedure: PERIPHERAL VASCULAR THROMBECTOMY;  Surgeon: Algernon Huxley, MD;  Location: [facility];  Service: Cardiovascular;  Laterality: Bilateral;  . PUBOVAGINAL SLING    . THROAT SURGERY    . THYROID SURGERY     tumor around vocal cords   . TOOTH EXTRACTION     winter 2018   . TOTAL THYROIDECTOMY  1976   Family History  Problem Relation Age of Onset  . Heart disease Mother   . Diabetes Father   . Cancer Daughter        breast ca x 2 s/p mastectomy    Social History   Socioeconomic History  . Marital status: Widowed    Spouse name: Not on file  . Number of children: 5  . Years of education: Not on file  . Highest education level: Not on file  Occupational History  . Occupation: retired  Tobacco Use  . Smoking status: Former Smoker    Packs/day: 0.50    Years: 20.00    Pack years: 10.00    Types: Cigarettes    Quit date: 09/20/1995    Years since quitting: 25.0  . Smokeless tobacco: Never Used  . Tobacco comment: quit 1996 smoked 20 years max 8 cig qd   Vaping Use  . Vaping Use: Never used  Substance and Sexual Activity  . Alcohol use: No  . Drug use: No  . Sexual activity: Not on file  Other Topics Concern  . Not on file  Social History Narrative   Lives at home with caretaker currently [name]    Has children    Likes to ride stationary bike and sew             Social Determinants of Health   Financial Resource Strain: Low Risk   . Difficulty of Paying Living Expenses: Not hard at all  Food Insecurity: No Food Insecurity  . Worried About Charity fundraiser in the Last Year: Never true  . Ran  Out of Food in the Last Year: Never true  Transportation Needs: No Transportation Needs  . Lack of Transportation (Medical): No  . Lack of Transportation (Non-Medical): No  Physical Activity: Insufficiently Active  . Days of Exercise per Week: 7 days  . Minutes of Exercise per  Session: 20 min  Stress: No Stress Concern Present  . Feeling of Stress : Not at all  Social Connections:   . Frequency of Communication with Friends and Family: Not on file  . Frequency of Social Gatherings with Friends and Family: Not on file  . Attends Religious Services: Not on file  . Active Member of Clubs or Organizations: Not on file  . Attends Archivist Meetings: Not on file  . Marital Status: Not on file    Tobacco Counseling Counseling given: Not Answered Comment: quit 1996 smoked 20 years max 8 cig qd    Clinical Intake:  Pre-visit preparation completed: Yes       Diabetes: Yes (Followed by pcp)  How often do you need to have someone help you when you read instructions, pamphlets, or other written materials from your doctor or pharmacy?: 1 - Never   Nutrition Risk Assessment: Has the patient had any N/V/D within the last 2 months?  No  Does the patient have any non-healing wounds?  No  Has the patient had any unintentional weight loss or weight gain?  No   Diabetes: Is the patient diabetic?  Yes  ,[place]. If diabetic, was a CBG obtained today?  No  Did the patient bring in their glucometer from home?  No  How often do you monitor your CBG's? Not checked at home anymore. Diet controlled. Checked every 3 months in office.  Last A1c 5.9. See chart review Nephrology on 09/22/20  Financial Strains and Diabetes Management: Are you having any financial strains with the device, your supplies or your medication? No .  Does the patient want to be seen by Chronic Care Management for management of their diabetes?  No  Would the patient like to be referred to a Nutritionist or for Diabetic Management?  No   Diabetic Exams: Diabetic Eye Exam: Completed 12/31/19 Diabetic Foot Exam: Completed 01/12/20   Interpreter Needed?: No    Activities of Daily Living In your present state of health, do you have any difficulty performing the following activities: 09/29/2020    Hearing? Y  Comment Hearing aids  Vision? N  Difficulty concentrating or making decisions? N  Walking or climbing stairs? Y  Comment Unsteady gait. Walker in use.  Dressing or bathing? Y  Comment Caregiver assist.  Doing errands, shopping? Y  Comment Caregiver assist.  Preparing Food and eating ? Y  Comment Caregiver assist meal prep. Self feeds.  Using the Toilet? N  In the past six months, have you accidently leaked urine? Y  Comment Managed with depend brief.  Do you have problems with loss of bowel control? N  Managing your Medications? Y  Comment Caregiver manages.  Managing your Finances? Y  Comment Family assist  Housekeeping or managing your Housekeeping? Y  Comment Caregiver manages.  Some recent data might be hidden    Patient Care Team: McLean-Scocuzza, Nino Glow, MD as PCP - General (Internal Medicine)  Indicate any recent Medical Services you may have received from other than [facility] providers in the past year (date may be approximate).     Assessment:   This is a routine wellness examination for [place].  I connected with [employer]  today by telephone and verified that I am speaking with the correct person using two identifiers. Location patient: home Location provider: work Persons participating in the virtual visit: patient, daughter, caregiver, Marine scientist.    I discussed the limitations, risks, security and privacy concerns of performing an evaluation and management service by telephone and the availability of in person appointments. The patient expressed understanding and verbally consented to this telephonic visit.    Interactive audio and video telecommunications were attempted between this provider and patient, however failed, due to patient having technical difficulties OR patient did not have access to video capability.  We continued and completed visit with audio only.  Some vital signs may be absent or patient reported.   Hearing/Vision screen  Hearing  Screening   125Hz  250Hz  500Hz  1000Hz  2000Hz  3000Hz  4000Hz  6000Hz  8000Hz   Right ear:           Left ear:           Comments: Hearing loss R ear Hearing aid, Right ear  Vision Screening Comments: Visual acuity not assessed, virtual visit.  Wears corrective lenses They have seen their ophthalmologist in the last 12 months  Dietary issues and exercise activities discussed: Current Exercise Habits: Home exercise routine, Type of exercise: calisthenics;strength training/weights (Stationary bike), Time (Minutes): 20, Frequency (Times/Week): 7, Weekly Exercise (Minutes/Week): 140, Intensity: Mild  Patient is alert and oriented x3.   Goals    . Follow up with Primary Care Provider     As needed      Depression Screen PHQ 2/9 Scores 09/29/2020 07/06/2020 04/06/2020 01/01/2020 09/29/2019 09/19/2018 08/29/2018  PHQ - 2 Score 0 5 0 0 0 0 0  PHQ- 9 Score - 10 - - - - -    Fall Risk Fall Risk  09/29/2020 08/12/2020 07/06/2020 04/06/2020 01/01/2020  Falls in the past year? 0 0 0 0 0  Number falls in past yr: 0 0 0 0 -  Injury with Fall? - 0 0 0 -  Risk for fall due to : - - Impaired balance/gait;Impaired mobility Impaired balance/gait -  Follow up Falls evaluation completed Falls evaluation completed Falls evaluation completed Falls evaluation completed -   Handrails in use when climbing stairs? Yes Home free of loose throw rugs in walkways, pet beds, electrical cords, etc? Yes  Adequate lighting in your home to reduce risk of falls? Yes   ASSISTIVE DEVICES UTILIZED TO PREVENT FALLS: Use of a cane, walker or w/c? Yes   TIMED UP AND GO: Was the test performed? No . Virtual visit.   Cognitive Function:  Patient is alert and oriented x3.  Denies difficulty with focusing, making decisions, memory loss. Age appropriate by direct conversation.  Enjoys crotchet and crafts for brain stimuli.    6CIT Screen 09/29/2020 09/19/2018  What Year? 0 points 0 points  What month? 0 points 0 points  What time? 0  points 0 points  Count back from 20 - 0 points  Months in reverse - (No Data)    Immunizations Immunization History  Administered Date(s) Administered  . Fluad Quad(high Dose 65+) 09/30/2019, 08/12/2020  . Influenza, High Dose Seasonal PF 09/19/2018  . Influenza-Unspecified 08/05/2015, 09/02/2017  . PFIZER SARS-COV-2 Vaccination 12/15/2019, 01/05/2020, 09/13/2020  . Pneumococcal Polysaccharide-23 08/29/2018   Health Maintenance Health Maintenance  Topic Date Due  . DEXA SCAN  Never done  . URINE MICROALBUMIN  05/21/2020  . OPHTHALMOLOGY EXAM  12/30/2020  . HEMOGLOBIN A1C  01/06/2021  . FOOT EXAM  01/11/2021  .  TETANUS/TDAP  08/04/2026  . INFLUENZA VACCINE  Completed  . COVID-19 Vaccine  Completed  . PNA vac Low Risk Adult  Completed   Colorectal cancer screening: No longer required.    Mammogram status: No longer required.   DEXA 01/01/08 osteopenia. On ca 600 bid and vit D 1000 iu qd  Dental Screening: Recommended annual dental exams for proper oral hygiene  Community Resource Referral / Chronic Care Management: CRR required this visit?  No   CCM required this visit?  No      Plan:   Keep all routine maintenance appointments.   Follow up 11/11/20 @ 1:30  Nurse notes: Patient mentioned seeing bruise on LLL and L thigh yesterday. Denies pain, redness, tenderness at site, falls or bumping any item. Patient is taking Eliquis 2.5mg  BID. Unsure if bruising is due to blood thinner. If condition worsens or new symptoms present patient agrees to contact physician or go to nearest Urgent Care.  I have personally reviewed and noted the following in the patient's chart:   . Medical and social history . Use of alcohol, tobacco or illicit drugs  . Current medications and supplements . Functional ability and status . Nutritional status . Physical activity . Advanced directives . List of other physicians . Hospitalizations, surgeries, and ER visits in previous 12  months . Vitals . Screenings to include cognitive, depression, and falls . Referrals and appointments  In addition, I have reviewed and discussed with patient certain preventive protocols, quality metrics, and best practice recommendations. A written personalized care plan for preventive services as well as general preventive health recommendations were provided to patient via mail.     Varney Biles, LPN   [id number]

## 2020-09-29 NOTE — Patient Instructions (Addendum)
Jessica Erickson , Thank you for taking time to come for your Medicare Wellness Visit. I appreciate your ongoing commitment to your health goals. Please review the following plan we discussed and let me know if I can assist you in the future.   These are the goals we discussed: Goals    . Follow up with Primary Care Provider     As needed       This is a list of the screening recommended for you and due dates:  Health Maintenance  Topic Date Due  . DEXA scan (bone density measurement)  Never done  . Urine Protein Check  05/21/2020  . Eye exam for diabetics  12/30/2020  . Hemoglobin A1C  01/06/2021  . Complete foot exam   01/11/2021  . Tetanus Vaccine  08/04/2026  . Flu Shot  Completed  . COVID-19 Vaccine  Completed  . Pneumonia vaccines  Completed    Immunizations Immunization History  Administered Date(s) Administered  . Fluad Quad(high Dose 65+) 09/30/2019, 08/12/2020  . Influenza, High Dose Seasonal PF 09/19/2018  . Influenza-Unspecified 08/05/2015, 09/02/2017  . PFIZER SARS-COV-2 Vaccination 12/15/2019, 01/05/2020, 09/13/2020  . Pneumococcal Polysaccharide-23 08/29/2018   Keep all routine maintenance appointments.   Follow up 11/11/20 @ 1:30  Advanced directives: End of life planning; Advance aging; Advanced directives discussed.  Copy of current HCPOA/Living Will requested.    Conditions/risks identified: none new.  Follow up in one year for your annual wellness visit.  Patient request Mychart reset. Number provided through calling IT [phone].    Preventive Care 80 Years and Older, Female Preventive care refers to lifestyle choices and visits with your health care provider that can promote health and wellness. What does preventive care include?  A yearly physical exam. This is also called an annual well check.  Dental exams once or twice a year.  Routine eye exams. Ask your health care provider how often you should have your eyes checked.  Personal  lifestyle choices, including:  Daily care of your teeth and gums.  Regular physical activity.  Eating a healthy diet.  Avoiding tobacco and drug use.  Limiting alcohol use.  Practicing safe sex.  Taking low-dose aspirin every day.  Taking vitamin and mineral supplements as recommended by your health care provider. What happens during an annual well check? The services and screenings done by your health care provider during your annual well check will depend on your age, overall health, lifestyle risk factors, and family history of disease. Counseling  Your health care provider may ask you questions about your:  Alcohol use.  Tobacco use.  Drug use.  Emotional well-being.  Home and relationship well-being.  Sexual activity.  Eating habits.  History of falls.  Memory and ability to understand (cognition).  Work and work Statistician.  Reproductive health. Screening  You may have the following tests or measurements:  Height, weight, and BMI.  Blood pressure.  Lipid and cholesterol levels. These may be checked every 5 years, or more frequently if you are over 34 years old.  Skin check.  Lung cancer screening. You may have this screening every year starting at age 55 if you have a 30-pack-year history of smoking and currently smoke or have quit within the past 15 years.  Fecal occult blood test (FOBT) of the stool. You may have this test every year starting at age 46.  Flexible sigmoidoscopy or colonoscopy. You may have a sigmoidoscopy every 5 years or a colonoscopy every 10 years starting at  age 34.  Hepatitis C blood test.  Hepatitis B blood test.  Sexually transmitted disease (STD) testing.  Diabetes screening. This is done by checking your blood sugar (glucose) after you have not eaten for a while (fasting). You may have this done every 1-3 years.  Bone density scan. This is done to screen for osteoporosis. You may have this done starting at age  47.  Mammogram. This may be done every 1-2 years. Talk to your health care provider about how often you should have regular mammograms. Talk with your health care provider about your test results, treatment options, and if necessary, the need for more tests. Vaccines  Your health care provider may recommend certain vaccines, such as:  Influenza vaccine. This is recommended every year.  Tetanus, diphtheria, and acellular pertussis (Tdap, Td) vaccine. You may need a Td booster every 10 years.  Zoster vaccine. You may need this after age 74.  Pneumococcal 13-valent conjugate (PCV13) vaccine. One dose is recommended after age 53.  Pneumococcal polysaccharide (PPSV23) vaccine. One dose is recommended after age 69. Talk to your health care provider about which screenings and vaccines you need and how often you need them. This information is not intended to replace advice given to you by your health care provider. Make sure you discuss any questions you have with your health care provider. Document Released: 12/17/2015 Document Revised: 08/09/2016 Document Reviewed: 09/21/2015 Elsevier Interactive Patient Education  2017 [place] Prevention in the Home Falls can cause injuries. They can happen to people of all ages. There are many things you can do to make your home safe and to help prevent falls. What can I do on the outside of my home?  Regularly fix the edges of walkways and driveways and fix any cracks.  Remove anything that might make you trip as you walk through a door, such as a raised step or threshold.  Trim any bushes or trees on the path to your home.  Use bright outdoor lighting.  Clear any walking paths of anything that might make someone trip, such as rocks or tools.  Regularly check to see if handrails are loose or broken. Make sure that both sides of any steps have handrails.  Any raised decks and porches should have guardrails on the edges.  Have any  leaves, snow, or ice cleared regularly.  Use sand or salt on walking paths during winter.  Clean up any spills in your garage right away. This includes oil or grease spills. What can I do in the bathroom?  Use night lights.  Install grab bars by the toilet and in the tub and shower. Do not use towel bars as grab bars.  Use non-skid mats or decals in the tub or shower.  If you need to sit down in the shower, use a plastic, non-slip stool.  Keep the floor dry. Clean up any water that spills on the floor as soon as it happens.  Remove soap buildup in the tub or shower regularly.  Attach bath mats securely with double-sided non-slip rug tape.  Do not have throw rugs and other things on the floor that can make you trip. What can I do in the bedroom?  Use night lights.  Make sure that you have a light by your bed that is easy to reach.  Do not use any sheets or blankets that are too big for your bed. They should not hang down onto the floor.  Have a firm chair  that has side arms. You can use this for support while you get dressed.  Do not have throw rugs and other things on the floor that can make you trip. What can I do in the kitchen?  Clean up any spills right away.  Avoid walking on wet floors.  Keep items that you use a lot in easy-to-reach places.  If you need to reach something above you, use a strong step stool that has a grab bar.  Keep electrical cords out of the way.  Do not use floor polish or wax that makes floors slippery. If you must use wax, use non-skid floor wax.  Do not have throw rugs and other things on the floor that can make you trip. What can I do with my stairs?  Do not leave any items on the stairs.  Make sure that there are handrails on both sides of the stairs and use them. Fix handrails that are broken or loose. Make sure that handrails are as long as the stairways.  Check any carpeting to make sure that it is firmly attached to the stairs.  Fix any carpet that is loose or worn.  Avoid having throw rugs at the top or bottom of the stairs. If you do have throw rugs, attach them to the floor with carpet tape.  Make sure that you have a light switch at the top of the stairs and the bottom of the stairs. If you do not have them, ask someone to add them for you. What else can I do to help prevent falls?  Wear shoes that:  Do not have high heels.  Have rubber bottoms.  Are comfortable and fit you well.  Are closed at the toe. Do not wear sandals.  If you use a stepladder:  Make sure that it is fully opened. Do not climb a closed stepladder.  Make sure that both sides of the stepladder are locked into place.  Ask someone to hold it for you, if possible.  Clearly mark and make sure that you can see:  Any grab bars or handrails.  First and last steps.  Where the edge of each step is.  Use tools that help you move around (mobility aids) if they are needed. These include:  Canes.  Walkers.  Scooters.  Crutches.  Turn on the lights when you go into a dark area. Replace any light bulbs as soon as they burn out.  Set up your furniture so you have a clear path. Avoid moving your furniture around.  If any of your floors are uneven, fix them.  If there are any pets around you, be aware of where they are.  Review your medicines with your doctor. Some medicines can make you feel dizzy. This can increase your chance of falling. Ask your doctor what other things that you can do to help prevent falls. This information is not intended to replace advice given to you by your health care provider. Make sure you discuss any questions you have with your health care provider. Document Released: 09/16/2009 Document Revised: 04/27/2016 Document Reviewed: 12/25/2014 Elsevier Interactive Patient Education  2017 [employer].

## 2020-10-04 ENCOUNTER — Telehealth: Payer: Self-pay

## 2020-10-04 NOTE — Telephone Encounter (Signed)
Pt's daughter [name] called and states that kidney specialist at [facility] told pt to start taking 80mg  of lasix-wanted pt call back about how much longer she is supposed to be taking the medication. She has been trying to call them for days and has left several messages and no one has called her back. She is getting nervous and would like a call back from someone asap.

## 2020-10-04 NOTE — Telephone Encounter (Signed)
I spoke to Jessica Erickson and the pt is doing well on the 80 mg Lasix- less edema, voiding more and no SE.   I was able to talk to Jessica Erickson Nephrology PA , Jessica Erickson via [employer]. She will call them tomorrow and will order Lasix at the higher dose and ask Jessica Erickson to come into their office in  2 weeks for labs.   Jessica Erickson is aware of this and is in agreement.

## 2020-10-04 NOTE — Telephone Encounter (Signed)
Please advise, Can we comment on this?

## 2020-10-05 ENCOUNTER — Other Ambulatory Visit: Payer: Self-pay | Admitting: Internal Medicine

## 2020-10-05 DIAGNOSIS — R600 Localized edema: Secondary | ICD-10-CM

## 2020-10-05 MED ORDER — FUROSEMIDE 40 MG PO TABS: 80.0000 mg | ORAL_TABLET | Freq: Every day | ORAL | 3 refills | Status: DC

## 2020-10-05 NOTE — Telephone Encounter (Signed)
Addressed by Dawson Bills in previous message. Encounter complete

## 2020-10-05 NOTE — Telephone Encounter (Signed)
Speak with nancy if lasix 80 mg daily helping continue  Kidney doctors can help manage kidneys and excess swelling I.e legs   TMS

## 2020-10-08 ENCOUNTER — Other Ambulatory Visit: Payer: Self-pay

## 2020-10-08 DIAGNOSIS — J309 Allergic rhinitis, unspecified: Secondary | ICD-10-CM

## 2020-10-08 MED ORDER — MELATONIN 10 MG PO TABS: 10.0000 mg | ORAL_TABLET | Freq: Every evening | ORAL | 1 refills | Status: DC

## 2020-10-08 MED ORDER — CETIRIZINE HCL 10 MG PO TABS: 10.0000 mg | ORAL_TABLET | Freq: Every day | ORAL | 3 refills | Status: DC

## 2020-10-11 DIAGNOSIS — H90A22 Sensorineural hearing loss, unilateral, left ear, with restricted hearing on the contralateral side: Secondary | ICD-10-CM | POA: Diagnosis not present

## 2020-10-11 DIAGNOSIS — R490 Dysphonia: Secondary | ICD-10-CM | POA: Diagnosis not present

## 2020-10-11 DIAGNOSIS — D481 Neoplasm of uncertain behavior of connective and other soft tissue: Secondary | ICD-10-CM | POA: Diagnosis not present

## 2020-10-11 DIAGNOSIS — H6063 Unspecified chronic otitis externa, bilateral: Secondary | ICD-10-CM | POA: Diagnosis not present

## 2020-10-11 DIAGNOSIS — H6123 Impacted cerumen, bilateral: Secondary | ICD-10-CM | POA: Diagnosis not present

## 2020-10-12 ENCOUNTER — Telehealth: Payer: Self-pay

## 2020-10-12 ENCOUNTER — Telehealth: Payer: Self-pay | Admitting: Internal Medicine

## 2020-10-12 ENCOUNTER — Other Ambulatory Visit: Payer: Self-pay

## 2020-10-12 DIAGNOSIS — J309 Allergic rhinitis, unspecified: Secondary | ICD-10-CM

## 2020-10-12 MED ORDER — FLUTICASONE PROPIONATE 50 MCG/ACT NA SUSP: 1.0000 | Freq: Every day | NASAL | 11 refills | Status: DC

## 2020-10-12 NOTE — Telephone Encounter (Signed)
Pt needs refill on fluticasone (FLONASE) 50 MCG/ACT nasal spray

## 2020-10-12 NOTE — Telephone Encounter (Signed)
Faxed RxRenewal Request to [facility] for [employer] Melatonin 10MG  take 1 tablet by mouth at bedtime for sleep faxed on 10-12-2020

## 2020-10-25 ENCOUNTER — Ambulatory Visit: Payer: Medicare PPO | Admitting: Podiatry

## 2020-10-25 ENCOUNTER — Encounter: Payer: Self-pay | Admitting: Podiatry

## 2020-10-25 ENCOUNTER — Other Ambulatory Visit: Payer: Self-pay

## 2020-10-25 DIAGNOSIS — E1140 Type 2 diabetes mellitus with diabetic neuropathy, unspecified: Secondary | ICD-10-CM

## 2020-10-25 DIAGNOSIS — B351 Tinea unguium: Secondary | ICD-10-CM | POA: Diagnosis not present

## 2020-10-25 DIAGNOSIS — M79676 Pain in unspecified toe(s): Secondary | ICD-10-CM | POA: Diagnosis not present

## 2020-10-25 DIAGNOSIS — D689 Coagulation defect, unspecified: Secondary | ICD-10-CM | POA: Diagnosis not present

## 2020-10-25 DIAGNOSIS — L84 Corns and callosities: Secondary | ICD-10-CM

## 2020-10-25 NOTE — Progress Notes (Signed)
This patient returns to my office for at risk foot care.  This patient requires this care by a professional since this patient will be at risk due to having history of DVT , coagulation defect and diabetes.  Patient is taking eliquis. This patient also has callus on her bunion right foot   This patient is unable to cut nails himself since the patient cannot reach his nails.These nails  And callus are painful walking and wearing shoes.  This patient presents for at risk foot care today.  General Appearance  Alert, conversant and in no acute stress.  Vascular  Dorsalis pedis and posterior tibial  pulses are palpable  bilaterally.  Capillary return is within normal limits  bilaterally. Temperature is within normal limits  bilaterally.  Neurologic  Senn-Weinstein monofilament wire test within normal limits  bilaterally. Muscle power within normal limits bilaterally.  Nails Thick disfigured discolored nails with subungual debris  from hallux to fifth toes bilaterally. No evidence of bacterial infection or drainage bilaterally.  Orthopedic  No limitations of motion  feet .  No crepitus or effusions noted.  No bony pathology or digital deformities noted. HAV  Right.    Skin  normotropic skin with no porokeratosis noted bilaterally.  No signs of infections or ulcers noted.   Callus 1st MPJ right foot.  Onychomycosis  Pain in right toes  Pain in left toes  Callus right foot.  Consent was obtained for treatment procedures.   Mechanical debridement of nails 1-5  bilaterally performed with a nail nipper.  Filed with dremel without incident. Debridement of callus with dremel.   Return office visit   3 months                   Told patient to return for periodic foot care and evaluation due to potential at risk complications.   Gardiner Barefoot DPM

## 2020-11-02 ENCOUNTER — Telehealth: Payer: Self-pay | Admitting: Internal Medicine

## 2020-11-02 NOTE — Telephone Encounter (Signed)
Patient dropped off [facility] paper work. Forms are up front in Dr. Claris Gladden color folder.

## 2020-11-02 NOTE — Telephone Encounter (Signed)
Placed on your desk. 

## 2020-11-11 ENCOUNTER — Ambulatory Visit (INDEPENDENT_AMBULATORY_CARE_PROVIDER_SITE_OTHER): Payer: Medicare PPO | Admitting: Internal Medicine

## 2020-11-11 ENCOUNTER — Other Ambulatory Visit: Payer: Self-pay

## 2020-11-11 ENCOUNTER — Encounter: Payer: Self-pay | Admitting: Internal Medicine

## 2020-11-11 VITALS — BP 124/80 | HR 71 | Temp 98.3°F | Ht 67.0 in | Wt 196.8 lb

## 2020-11-11 DIAGNOSIS — E1122 Type 2 diabetes mellitus with diabetic chronic kidney disease: Secondary | ICD-10-CM | POA: Diagnosis not present

## 2020-11-11 DIAGNOSIS — N1832 Chronic kidney disease, stage 3b: Secondary | ICD-10-CM

## 2020-11-11 DIAGNOSIS — N184 Chronic kidney disease, stage 4 (severe): Secondary | ICD-10-CM

## 2020-11-11 DIAGNOSIS — G500 Trigeminal neuralgia: Secondary | ICD-10-CM | POA: Diagnosis not present

## 2020-11-11 DIAGNOSIS — E039 Hypothyroidism, unspecified: Secondary | ICD-10-CM | POA: Diagnosis not present

## 2020-11-11 DIAGNOSIS — R609 Edema, unspecified: Secondary | ICD-10-CM | POA: Diagnosis not present

## 2020-11-11 DIAGNOSIS — J449 Chronic obstructive pulmonary disease, unspecified: Secondary | ICD-10-CM

## 2020-11-11 LAB — BASIC METABOLIC PANEL
BUN: 19 mg/dL (ref 6–23)
CO2: 38 mEq/L — ABNORMAL HIGH (ref 19–32)
Calcium: 8.6 mg/dL (ref 8.4–10.5)
Chloride: 99 mEq/L (ref 96–112)
Creatinine, Ser: 1.53 mg/dL — ABNORMAL HIGH (ref 0.40–1.20)
GFR: 29.35 mL/min — ABNORMAL LOW (ref >60.00)
Glucose, Bld: 115 mg/dL — ABNORMAL HIGH (ref 70–99)
Potassium: 3.7 mEq/L (ref 3.5–5.1)
Sodium: 145 mEq/L (ref 135–145)

## 2020-11-11 LAB — HEMOGLOBIN A1C: Hgb A1c MFr Bld: 6.2 % (ref 4.6–6.5)

## 2020-11-11 LAB — TSH: TSH: 1.21 u[IU]/mL (ref 0.35–4.50)

## 2020-11-11 MED ORDER — GABAPENTIN 400 MG PO CAPS: 400.0000 mg | ORAL_CAPSULE | Freq: Three times a day (TID) | ORAL | Status: DC

## 2020-11-11 NOTE — Patient Instructions (Addendum)
I want you to get copper tone knee highs from [facility]   Thank you for the goodies and Happy Holidays

## 2020-11-11 NOTE — Progress Notes (Signed)
Chief Complaint  Patient presents with   Follow-up   F/u with caretaker  1. Hypothyroidism on synthyroid 200 mcg qd check TSH today  2. CKD 4 on lasix 80 mg per pt for leg edema which is better but this lasix dose too much and using the restroom a lot will CC CCK to comment on dose of lasix for pt and CC labs  Last Cr 1.5 and GFR 30 with renal  3.DM 2 on januvia 25 mg qd will check A1C today  4. Trigeminal neuralgia on gabapentin now 400 tid  5. Copd stable on trelegy prn duoneb  Review of Systems  Constitutional: Negative for weight loss.  HENT: Positive for hearing loss.   Eyes: Negative for blurred vision.  Respiratory: Negative for shortness of breath.   Cardiovascular: Negative for chest pain.  Gastrointestinal: Negative for abdominal pain.  Genitourinary: Negative for dysuria.  Musculoskeletal: Negative for falls and joint pain.  Skin: Negative for rash.  Neurological: Negative for headaches.  Psychiatric/Behavioral: Negative for depression.   Past Medical History:  Diagnosis Date   Acoustic neuroma (HCC)    Allergy    Asthma    Bilateral swelling of feet    and legs   Bladder infection    CAD (coronary artery disease)    Cataract    Change in voice    Compression fracture of body of thoracic vertebra (HCC)    T12 09/18/15 MRI s/p fall    Constipation    COPD (chronic obstructive pulmonary disease) (HCC)    previous CXR with chronic interstitial lung dz    CVA (cerebral vascular accident) ([place])    Depression    Diabetes ([place])    with neuropathy   Diabetes mellitus, type 2 ([place])    Diarrhea    Double vision    DVT (deep venous thrombosis) ([place])    right leg 10/2015 was on coumadin off as of 2017/2018 ; s/p IVC filter   Enuresis    Eye pain, right    Fall    Fatty liver    09/15/15 also mildly dilated pancreatitic duct rec MRCP small sub cm cyst hemangioma speeln mild right hydronephrorossi and prox. hydroureter, kidney stones, mild  scarring kidneys   Female stress incontinence    Flank pain    GERD (gastroesophageal reflux disease)    with small hiatal hernia    Hard of hearing    Heart disease    History of kidney problems    Hyperlipidemia    mixed   Hypertension    Hypothyroidism, postsurgical    Impaired mobility and ADLs    uses rolling walker has caretaker 24/7 at home   Leg edema    Mixed incontinence urge and stress (female)(female)    Neuropathy    Osteoarthritis    DDD spine    Osteoporosis with fracture    T12 compression fracture   Photophobia    Pulmonary embolism ([place])    10/2015 off coumadin as of 04/2016   Pulmonary HTN (HCC)    mild pulm HTN, echo 10/09/15 EF 20-10%OFHQR 1 dd, RV systolic pressure increased    Recurrent UTI    Sinus pressure    Skin cancer    BCC jawline and scalp    Thyroid disease    follows [facility]   TIA (transient ischemic attack)    MRI 2009/2010 neg stroke    Trigeminal neuralgia    Dr. Tomi Bamberger s/p gamma knife x 2, on Tegretol since  2011/2012 no increase in dose >200 mg bid rec per family per neurology    Urinary frequency    Urinary, incontinence, stress female    Dr Erlene Quan urology    Past Surgical History:  Procedure Laterality Date   APPENDECTOMY     as a child, open   [place] removal 1996    brain tumor surgery     BREAST SURGERY     breast bx   CATARACT EXTRACTION     CHOLECYSTECTOMY     EYE SURGERY     cataract   IVC FILTER PLACEMENT ([place] [facility])     Dr. Lucky Cowboy 10/2015    LAPAROSCOPIC TUBAL LIGATION     MOHS SURGERY     scalp 04/2014    PERIPHERAL VASCULAR CATHETERIZATION N/A 10/11/2015   Procedure: IVC Filter Insertion;  Surgeon: Algernon Huxley, MD;  Location: [facility];  Service: Cardiovascular;  Laterality: N/A;   PERIPHERAL VASCULAR THROMBECTOMY Bilateral 03/29/2018   Procedure: PERIPHERAL VASCULAR THROMBECTOMY;  Surgeon: Algernon Huxley, MD;  Location: [facility];   Service: Cardiovascular;  Laterality: Bilateral;   PUBOVAGINAL SLING     THROAT SURGERY     THYROID SURGERY     tumor around vocal cords    TOOTH EXTRACTION     winter 2018    TOTAL THYROIDECTOMY  1976   Family History  Problem Relation Age of Onset   Heart disease Mother    Diabetes Father    Cancer Daughter        breast ca x 2 s/p mastectomy    Social History   Socioeconomic History   Marital status: Widowed    Spouse name: Not on file   Number of children: 5   Years of education: Not on file   Highest education level: Not on file  Occupational History   Occupation: retired  Tobacco Use   Smoking status: Former Smoker    Packs/day: 0.50    Years: 20.00    Pack years: 10.00    Types: Cigarettes    Quit date: 09/20/1995    Years since quitting: 25.1   Smokeless tobacco: Never Used   Tobacco comment: quit 1996 smoked 20 years max 8 cig qd   Vaping Use   Vaping Use: Never used  Substance and Sexual Activity   Alcohol use: No   Drug use: No   Sexual activity: Not on file  Other Topics Concern   Not on file  Social History Narrative   Lives at home with caretaker currently [name]    Has children    Likes to ride stationary bike and sew             Social Determinants of Health   Financial Resource Strain: Low Risk    Difficulty of Paying Living Expenses: Not hard at all  Food Insecurity: No Food Insecurity   Worried About Charity fundraiser in the Last Year: Never true   Arboriculturist in the Last Year: Never true  Transportation Needs: No Transportation Needs   Lack of Transportation (Medical): No   Lack of Transportation (Non-Medical): No  Physical Activity: Insufficiently Active   Days of Exercise per Week: 7 days   Minutes of Exercise per Session: 20 min  Stress: No Stress Concern Present   Feeling of Stress : Not at all  Social Connections: Not on file  Intimate Partner Violence: Not on file   Current  Meds   Medication Sig   acetaminophen (TYLENOL) 325 MG tablet Take 2 tablets (650 mg total) by mouth every 6 (six) hours as needed.   acetaminophen-codeine (TYLENOL #3) 300-30 MG tablet Take 1 tablet by mouth 2 (two) times daily as needed for moderate pain.   albuterol (PROVENTIL) (2.5 MG/3ML) 0.083% nebulizer solution Take 2.5 mg by nebulization 3 (three) times daily.    AMBULATORY NON FORMULARY MEDICATION Medication Name: Please dispense flutter valve to use three times daily after nebulization. DX:J47.9   apixaban (ELIQUIS) 2.5 MG TABS tablet Take 1 tablet (2.5 mg total) by mouth 2 (two) times daily.   atorvastatin (LIPITOR) 40 MG tablet Take 1 tablet (40 mg total) by mouth daily at 6 PM. Generic ok   bisacodyl (DULCOLAX) 5 MG EC tablet Take 1 tablet (5 mg total) by mouth daily as needed for moderate constipation.   Calcium Carbonate-Vitamin D (CALCIUM 600+D) 600-400 MG-UNIT tablet Take 1 tablet by mouth 2 (two) times daily. Lunch and dinner   cetirizine (ZYRTEC) 10 MG tablet Take 1 tablet (10 mg total) by mouth daily.   clotrimazole-betamethasone (LOTRISONE) lotion SMARTSIG:In Ear(s)   DOK 100 MG capsule TAKE 1 CAPSULE BY MOUTH TWICE DAILY   fluticasone (FLONASE) 50 MCG/ACT nasal spray Place 1-2 sprays into both nostrils daily. Max 2 sprays   Fluticasone-Umeclidin-Vilant (TRELEGY ELLIPTA) 100-62.5-25 MCG/INH AEPB Inhale 1 puff into the lungs daily. Samples of this drug were given to the patient, quantity 1/14 doses, Lot Number [id number] nov 22 exp   Fluticasone-Umeclidin-Vilant (TRELEGY ELLIPTA) 100-62.5-25 MCG/INH AEPB Inhale 1 puff into the lungs daily. Rinse mouth d/c symbicort and spiriva   furosemide (LASIX) 40 MG tablet Take 2 tablets (80 mg total) by mouth daily. May take 2nd dose 40 mg if needed at lunch for 5 days as needed   guaiFENesin (MUCINEX) 600 MG 12 hr tablet Take 600 mg by mouth daily.    hydrocortisone 2.5 % cream Apply topically 2 (two) times daily.    ipratropium-albuterol (DUONEB) 0.5-2.5 (3) MG/3ML SOLN Inhale 3 mLs into the lungs every 4 (four) hours as needed.   levothyroxine (SYNTHROID) 200 MCG tablet Take 1 tablet (200 mcg total) by mouth daily before breakfast. Except on Sunday. Do not take with other medications or vitamins   Melatonin 10 MG TABS Take 10 mg by mouth at bedtime.   mirtazapine (REMERON) 15 MG tablet Take 1 tablet (15 mg total) by mouth at bedtime.   Multiple Vitamin (MULTIVITAMIN WITH MINERALS) TABS tablet Take 1 tablet by mouth daily.    pantoprazole (PROTONIX) 40 MG tablet Take 1 tablet (40 mg total) by mouth daily. 30 minutes before lunch or dinner   polyethylene glycol powder (GLYCOLAX/MIRALAX) 17 GM/SCOOP powder Take 17 g by mouth daily.   polyvinyl alcohol (LIQUIFILM TEARS) 1.4 % ophthalmic solution Place 1 drop into both eyes as needed for dry eyes.   sitaGLIPtin (JANUVIA) 25 MG tablet Take 1 tablet (25 mg total) by mouth daily.   [DISCONTINUED] gabapentin (NEURONTIN) 300 MG capsule Take by mouth.   Allergies  Allergen Reactions   Penicillins Shortness Of Breath, Rash and Other (See Comments)    Has patient had a PCN reaction causing immediate rash, facial/tongue/throat swelling, SOB or lightheadedness with hypotension: Yes Has patient had a PCN reaction causing severe rash involving mucus membranes or skin necrosis: No Has patient had a PCN reaction that required hospitalization No Has patient had a PCN reaction occurring within the last 10 years: No If all of the  above answers are "NO", then may proceed with Cephalosporin use.   Sulfa Antibiotics Shortness Of Breath, Rash and Other (See Comments)   Amitiza [Lubiprostone]     N/v/d   Aspirin Other (See Comments)    Reaction:  Unknown  Other reaction(s): Bleeding (intolerance) Per patient " causes nose to bleed" Can take 81 mg daily without any complications Other reaction(s): "bloody nose"    Penicillin G Other (See Comments)    Has  patient had a PCN reaction causing immediate rash, facial/tongue/throat swelling, SOB or lightheadedness with hypotension: No Has patient had a PCN reaction causing severe rash involving mucus membranes or skin necrosis: Unknown Has patient had a PCN reaction that required hospitalization: Unknown Has patient had a PCN reaction occurring within the last 10 years: Unknown If all of the above answers are "NO", then may proceed with Cephalosporin use.   Recent Results (from the past 2160 hour(s))  TSH     Status: None   Collection Time: 11/11/20  2:19 PM  Result Value Ref Range   TSH 1.21 0.35 - 4.50 uIU/mL  Hemoglobin A1c     Status: None   Collection Time: 11/11/20  2:19 PM  Result Value Ref Range   Hgb A1c MFr Bld 6.2 4.6 - 6.5 %    Comment: Glycemic Control Guidelines for People with Diabetes:Non Diabetic:  <6%Goal of Therapy: <7%Additional Action Suggested:  >7%   Basic Metabolic Panel (BMET)     Status: Abnormal   Collection Time: 11/11/20  2:19 PM  Result Value Ref Range   Sodium 145 135 - 145 mEq/L   Potassium 3.7 3.5 - 5.1 mEq/L   Chloride 99 96 - 112 mEq/L   CO2 38 (H) 19 - 32 mEq/L   Glucose, Bld 115 (H) 70 - 99 mg/dL   BUN 19 6 - 23 mg/dL   Creatinine, Ser 1.53 (H) 0.40 - 1.20 mg/dL   GFR 29.35 (L) >60.00 mL/min    Comment: Calculated using the CKD-EPI Creatinine Equation (2021)   Calcium 8.6 8.4 - 10.5 mg/dL   Objective  Body mass index is 30.82 kg/m. Wt Readings from Last 3 Encounters:  11/11/20 196 lb 12.8 oz (89.3 kg)  09/29/20 195 lb (88.5 kg)  08/12/20 195 lb (88.5 kg)   Temp Readings from Last 3 Encounters:  11/11/20 98.3 F (36.8 C) (Oral)  08/12/20 98.7 F (37.1 C) (Oral)  07/09/20 98 F (36.7 C)   BP Readings from Last 3 Encounters:  11/11/20 124/80  08/12/20 126/80  07/09/20 118/74   Pulse Readings from Last 3 Encounters:  11/11/20 71  08/12/20 66  07/09/20 76    Physical Exam Vitals and nursing note reviewed.  Constitutional:       Appearance: Normal appearance. She is well-developed and well-groomed. She is obese.  HENT:     Head: Normocephalic and atraumatic.  Eyes:     Conjunctiva/sclera: Conjunctivae normal.     Pupils: Pupils are equal, round, and reactive to light.  Cardiovascular:     Rate and Rhythm: Normal rate and regular rhythm.     Heart sounds: Normal heart sounds. No murmur heard.   Pulmonary:     Effort: Pulmonary effort is normal.     Breath sounds: Normal breath sounds.  Skin:    General: Skin is warm and dry.  Neurological:     General: No focal deficit present.     Mental Status: She is alert and oriented to person, place, and time. Mental status is  at baseline.     Gait: Gait normal.  Psychiatric:        Attention and Perception: Attention and perception normal.        Mood and Affect: Mood and affect normal.        Speech: Speech normal.        Behavior: Behavior normal. Behavior is cooperative.        Thought Content: Thought content normal.        Cognition and Memory: Cognition and memory normal.        Judgment: Judgment normal.     Assessment  Plan  Hypothyroidism, unspecified type - Plan: TSH synthyroid 200 mcg qd   Trigeminal neuralgia - Plan: gabapentin (NEURONTIN) 400 MG capsule tid  Type 2 diabetes mellitus with stage 3b chronic kidney disease, without long-term current use of insulin (HCC) - Plan: Hemoglobin Y1E, Basic Metabolic Panel (BMET) januvia 25 mg qd controlled   Edema, unspecified type with CKD3B borderline 4 - Plan: Basic Metabolic Panel (BMET) Was on lasix 80 mg qd will get renal to comment on dosing based on labs   Chronic obstructive pulmonary disease, unspecified COPD type ([place])  Stable on trelegy no wheezing today   HM Fluutd prevnar had 09/20/16 pna 23 08/29/18 Tdap 08/04/16 shingrixnot had out of stock called pharmacy,? zostavaxif had MMR immune covid 3/3 pfizer  Estdermatologywill see 7 or 07/2020 Dr. Evorn Gong Out of age window pap,  colonoscopy, mammo DEXA 01/01/08 osteopeniawill make sure on ca 600 bid and vit D 1000 iu qd -Vitamin d 03/15/18 48.1 -disc with patient and family at f/u if want to pursuein futureDEXA  Est vascular AV[facility] Est dermatology Est renal [facility]  [place]  Provider: Dr. Olivia Mackie McLean-Scocuzza-Internal Medicine

## 2020-11-30 ENCOUNTER — Encounter: Payer: Self-pay | Admitting: Internal Medicine

## 2020-11-30 NOTE — Telephone Encounter (Signed)
Letter typed to go with paperwork  Call pt and mail letter and paper work and scan   Thanks [place]

## 2020-12-02 NOTE — Telephone Encounter (Signed)
Paperwork copied, sent to scan, and mailed off in envelope provided by the Patient.   Called and informed Patient's care taker [place] that this has been done. She states she will inform the Patient once the Patient is out of the bathroom.

## 2020-12-29 ENCOUNTER — Telehealth: Payer: Self-pay | Admitting: Internal Medicine

## 2020-12-29 NOTE — Telephone Encounter (Signed)
Pt daughter called and wanted to let Dr. Olivia Mackie know that her mother refused to go to her Nephrology appt today so they had to cancel it Pt daughter wanted a call back to discuss care

## 2020-12-29 NOTE — Telephone Encounter (Signed)
Spoke with the Patient's daughter. Informed her that with her mother's history Dr Olivia Mackie McLean-Scocuzza will not manage her Kidney medications.  Got the Patient scheduled for her 4 month follow up. Informed the Daughter that we can monitor the Patient with lab work but the moment the Patient's numbers fall out of line she will be referred back the her kidney doctor.  Patient will need detailed monitoring and medication management through nephrology. Will discuss at Patient's 03/30/21 appointment.   Patient is refusing to continue to see her kidney doctor as her current numbers are stable.

## 2020-12-29 NOTE — Telephone Encounter (Signed)
Inform daughter   With kidney function where she is chronic kidney disease I have reviewed with Jessica Erickson the importance of following up with her kidney specialists as this is not my area of expertise she previously stated she prefers to see the doctor there so when they call to reschedule appt with the doctor would be best   Thank you

## 2020-12-29 NOTE — Telephone Encounter (Signed)
Please advise 

## 2021-01-06 ENCOUNTER — Telehealth: Payer: Self-pay

## 2021-01-06 NOTE — Telephone Encounter (Signed)
Jessica Erickson would like for you to call her. She said it is a complex situation and needs for the nurse to give her a call. I asked was it regarding what was discussed on 12/29/20 and she said no.

## 2021-01-06 NOTE — Telephone Encounter (Signed)
Patient's daughter called in as she needed some advise regarding her brother, the Patient's son.  States that since October he has been having sever dizzy spells and will lay in the floor for hours. States he is from [place] and [place] made his symptoms worse. States the son saw an ENT provider that gave him medication for dizziness, steroids, and antibiotics. States none of this is working and wanted advise on ENT's in our area.   Informed [name] of [facility] and that I was unsure if they take urgent self referral appointments. Informed her that they can call around to the different Emergency departments and see if they have an ENT on call. If so the son can be taken there for evaluation and imaging.   [name] verbalized understanding.

## 2021-01-10 ENCOUNTER — Telehealth: Payer: Self-pay | Admitting: Internal Medicine

## 2021-01-10 NOTE — Telephone Encounter (Signed)
I cant do anything about pt refusing to go to her appts  Family is going to have to get with patient and discuss goals of care I have already expressed importance with the patient about specialist  I can mail them a most form and family can review this with patient  Hire and attorney to discuss power of attorney, living will and goals of care and if wants to be FULL code or DNR Her brother is a doctor and he may be able to facilitate this conversation with patient and kids  Do they want MOST formed mailed ? If so mail to Jessica Erickson or patients home

## 2021-01-10 NOTE — Telephone Encounter (Signed)
Patient (PATIENT REFUSING TO COME TO APT PER DAUGHTER NANCY TAYLOR)  [facility]

## 2021-01-27 ENCOUNTER — Encounter: Payer: Self-pay | Admitting: Podiatry

## 2021-01-27 ENCOUNTER — Ambulatory Visit (INDEPENDENT_AMBULATORY_CARE_PROVIDER_SITE_OTHER): Payer: Medicare PPO | Admitting: Podiatry

## 2021-01-27 ENCOUNTER — Other Ambulatory Visit: Payer: Self-pay

## 2021-01-27 DIAGNOSIS — M79676 Pain in unspecified toe(s): Secondary | ICD-10-CM | POA: Diagnosis not present

## 2021-01-27 DIAGNOSIS — B351 Tinea unguium: Secondary | ICD-10-CM

## 2021-01-27 DIAGNOSIS — D689 Coagulation defect, unspecified: Secondary | ICD-10-CM

## 2021-01-27 DIAGNOSIS — L84 Corns and callosities: Secondary | ICD-10-CM

## 2021-01-27 NOTE — Progress Notes (Signed)
This patient returns to my office for at risk foot care.  This patient requires this care by a professional since this patient will be at risk due to having history of DVT , coagulation defect and diabetes.  Patient is taking eliquis. This patient also has callus on her bunion right foot   This patient is unable to cut nails himself since the patient cannot reach his nails.These nails  And callus are painful walking and wearing shoes.  This patient presents for at risk foot care today.  General Appearance  Alert, conversant and in no acute stress.  Vascular  Dorsalis pedis and posterior tibial  pulses are weakly  Palpable due to swelling  bilaterally.  Capillary return is within normal limits  bilaterally. Temperature is within normal limits  bilaterally.  Neurologic  Senn-Weinstein monofilament wire test within normal limits  bilaterally. Muscle power within normal limits bilaterally.  Nails Thick disfigured discolored nails with subungual debris  from hallux to fifth toes bilaterally. No evidence of bacterial infection or drainage bilaterally.  Orthopedic  No limitations of motion  feet .  No crepitus or effusions noted.  No bony pathology or digital deformities noted. HAV  Right.    Skin  normotropic skin with no porokeratosis noted bilaterally.  No signs of infections or ulcers noted.   Callus 1st MPJ right foot.  Onychomycosis  Pain in right toes  Pain in left toes  Callus right foot.  Consent was obtained for treatment procedures.   Mechanical debridement of nails 1-5  bilaterally performed with a nail nipper.  Filed with dremel without incident. Debridement of callus with dremel.   Return office visit   3 months                   Told patient to return for periodic foot care and evaluation due to potential at risk complications.   Gardiner Barefoot DPM

## 2021-03-29 ENCOUNTER — Encounter: Payer: Self-pay | Admitting: Internal Medicine

## 2021-03-29 ENCOUNTER — Other Ambulatory Visit: Payer: Self-pay

## 2021-03-29 ENCOUNTER — Ambulatory Visit: Payer: Medicare PPO | Admitting: Internal Medicine

## 2021-03-29 VITALS — BP 110/74 | HR 69 | Temp 98.0°F | Ht 67.0 in | Wt 199.0 lb

## 2021-03-29 DIAGNOSIS — T148XXA Other injury of unspecified body region, initial encounter: Secondary | ICD-10-CM | POA: Diagnosis not present

## 2021-03-29 DIAGNOSIS — J309 Allergic rhinitis, unspecified: Secondary | ICD-10-CM

## 2021-03-29 DIAGNOSIS — I890 Lymphedema, not elsewhere classified: Secondary | ICD-10-CM

## 2021-03-29 DIAGNOSIS — L719 Rosacea, unspecified: Secondary | ICD-10-CM

## 2021-03-29 DIAGNOSIS — Z86718 Personal history of other venous thrombosis and embolism: Secondary | ICD-10-CM | POA: Diagnosis not present

## 2021-03-29 DIAGNOSIS — E039 Hypothyroidism, unspecified: Secondary | ICD-10-CM | POA: Diagnosis not present

## 2021-03-29 DIAGNOSIS — E119 Type 2 diabetes mellitus without complications: Secondary | ICD-10-CM | POA: Diagnosis not present

## 2021-03-29 DIAGNOSIS — N184 Chronic kidney disease, stage 4 (severe): Secondary | ICD-10-CM

## 2021-03-29 DIAGNOSIS — G4700 Insomnia, unspecified: Secondary | ICD-10-CM

## 2021-03-29 DIAGNOSIS — I824Y3 Acute embolism and thrombosis of unspecified deep veins of proximal lower extremity, bilateral: Secondary | ICD-10-CM

## 2021-03-29 DIAGNOSIS — J449 Chronic obstructive pulmonary disease, unspecified: Secondary | ICD-10-CM | POA: Diagnosis not present

## 2021-03-29 DIAGNOSIS — K219 Gastro-esophageal reflux disease without esophagitis: Secondary | ICD-10-CM

## 2021-03-29 DIAGNOSIS — F32A Depression, unspecified: Secondary | ICD-10-CM

## 2021-03-29 MED ORDER — APIXABAN 2.5 MG PO TABS: 2.5000 mg | ORAL_TABLET | Freq: Two times a day (BID) | ORAL | 3 refills | Status: DC

## 2021-03-29 MED ORDER — PANTOPRAZOLE SODIUM 40 MG PO TBEC: 40.0000 mg | DELAYED_RELEASE_TABLET | Freq: Every day | ORAL | 3 refills | Status: DC

## 2021-03-29 MED ORDER — AZELAIC ACID 15 % EX GEL: 1.0000 "application " | Freq: Two times a day (BID) | CUTANEOUS | 11 refills | Status: DC

## 2021-03-29 MED ORDER — LEVOTHYROXINE SODIUM 200 MCG PO TABS
200.0000 ug | ORAL_TABLET | Freq: Every day | ORAL | 3 refills | Status: DC
Start: 2021-03-29 — End: 2022-01-25

## 2021-03-29 MED ORDER — IPRATROPIUM-ALBUTEROL 0.5-2.5 (3) MG/3ML IN SOLN: 3.0000 mL | RESPIRATORY_TRACT | 12 refills | Status: DC | PRN

## 2021-03-29 MED ORDER — MUPIROCIN 2 % EX OINT: 1.0000 "application " | TOPICAL_OINTMENT | Freq: Three times a day (TID) | CUTANEOUS | 0 refills | Status: DC

## 2021-03-29 MED ORDER — FLUTICASONE PROPIONATE 50 MCG/ACT NA SUSP: 1.0000 | Freq: Every day | NASAL | 11 refills | Status: DC

## 2021-03-29 MED ORDER — TRELEGY ELLIPTA 100-62.5-25 MCG/INH IN AEPB: 1.0000 | INHALATION_SPRAY | Freq: Every day | RESPIRATORY_TRACT | 12 refills | Status: DC

## 2021-03-29 MED ORDER — MIRTAZAPINE 15 MG PO TABS: 15.0000 mg | ORAL_TABLET | Freq: Every day | ORAL | 3 refills | Status: DC

## 2021-03-29 MED ORDER — ATORVASTATIN CALCIUM 40 MG PO TABS: 40.0000 mg | ORAL_TABLET | Freq: Every day | ORAL | 3 refills | Status: DC

## 2021-03-29 MED ORDER — CETIRIZINE HCL 10 MG PO TABS: 10.0000 mg | ORAL_TABLET | Freq: Every day | ORAL | 3 refills | Status: DC

## 2021-03-29 NOTE — Progress Notes (Addendum)
Chief Complaint  Patient presents with  . Skin Problem    Spots on the face    F/u with son Jessica Erickson 1. Red spots on face > 1 week ago pt states getting bites from bug but son states sheets being changed and no new skin products she has had rosacea in the past and been txed with metrogel w/o help in the past 2. DM 2 A1C 6.2 on januvia 25 mg qd pt worried about cost of meds and will still stop this today  3. In 1-2 months she will move in son with son as all long term care funds have run out also we disc MOST form given to son Jessica Erickson to review with family and discuss another time with me  4. Chronic leg swelling/lymphedema h/o DVT on eliquis 2.5 mg bid  5. CKD 3B/4 she is agreeable to [facility] but wants to see MD  Prev refusing to see renal but wants to see MD agreeable to f/u today    Review of Systems  Constitutional: Negative for weight loss.  HENT: Positive for hearing loss.   Eyes: Negative for blurred vision.  Respiratory: Negative for shortness of breath.   Cardiovascular: Positive for leg swelling. Negative for chest pain.  Skin: Positive for itching and rash.  Psychiatric/Behavioral: Negative for memory loss.   Past Medical History:  Diagnosis Date  . Acoustic neuroma ([place])   . Allergy   . Asthma   . Bilateral swelling of feet    and legs  . Bladder infection   . CAD (coronary artery disease)   . Cataract   . Change in voice   . Compression fracture of body of thoracic vertebra (HCC)    T12 09/18/15 MRI s/p fall   . Constipation   . COPD (chronic obstructive pulmonary disease) ([place])    previous CXR with chronic interstitial lung dz   . CVA (cerebral vascular accident) ([place])   . Depression   . Diabetes ([place])    with neuropathy  . Diabetes mellitus, type 2 ([place])   . Diarrhea   . Double vision   . DVT (deep venous thrombosis) ([place])    right leg 10/2015 was on coumadin off as of 2017/2018 ; s/p IVC filter  . Enuresis   . Eye pain, right   . Fall   . Fatty  liver    09/15/15 also mildly dilated pancreatitic duct rec MRCP small sub cm cyst hemangioma speeln mild right hydronephrorossi and prox. hydroureter, kidney stones, mild scarring kidneys  . Female stress incontinence   . Flank pain   . GERD (gastroesophageal reflux disease)    with small hiatal hernia   . Hard of hearing   . Heart disease   . History of kidney problems   . Hyperlipidemia    mixed  . Hypertension   . Hypothyroidism, postsurgical   . Impaired mobility and ADLs    uses rolling walker has caretaker 24/7 at home  . Leg edema   . Mixed incontinence urge and stress (female)(female)   . Neuropathy   . Osteoarthritis    DDD spine   . Osteoporosis with fracture    T12 compression fracture  . Photophobia   . Pulmonary embolism ([place])    10/2015 off coumadin as of 04/2016  . Pulmonary HTN (HCC)    mild pulm HTN, echo 10/09/15 EF 62-22%LNLGX 1 dd, RV systolic pressure increased   . Recurrent UTI   . Sinus pressure   .  Skin cancer    BCC jawline and scalp   . Thyroid disease    follows [facility]  . TIA (transient ischemic attack)    MRI 2009/2010 neg stroke   . Trigeminal neuralgia    Dr. Tomi Bamberger s/p gamma knife x 2, on Tegretol since 2011/2012 no increase in dose >200 mg bid rec per family per neurology   . Urinary frequency   . Urinary, incontinence, stress female    Dr Erlene Quan urology    Past Surgical History:  Procedure Laterality Date  . APPENDECTOMY     as a child, open  . BRAIN SURGERY     schwnnoma removal 1996   . brain tumor surgery    . BREAST SURGERY     breast bx  . CATARACT EXTRACTION    . CHOLECYSTECTOMY    . EYE SURGERY     cataract  . IVC FILTER PLACEMENT ([facility] HX)     Dr. Lucky Cowboy 10/2015   . LAPAROSCOPIC TUBAL LIGATION    . MOHS SURGERY     scalp 04/2014   . PERIPHERAL VASCULAR CATHETERIZATION N/A 10/11/2015   Procedure: IVC Filter Insertion;  Surgeon: Algernon Huxley, MD;  Location: [facility];  Service: Cardiovascular;   Laterality: N/A;  . PERIPHERAL VASCULAR THROMBECTOMY Bilateral 03/29/2018   Procedure: PERIPHERAL VASCULAR THROMBECTOMY;  Surgeon: Algernon Huxley, MD;  Location: [facility];  Service: Cardiovascular;  Laterality: Bilateral;  . PUBOVAGINAL SLING    . THROAT SURGERY    . THYROID SURGERY     tumor around vocal cords   . TOOTH EXTRACTION     winter 2018   . TOTAL THYROIDECTOMY  1976   Family History  Problem Relation Age of Onset  . Heart disease Mother   . Diabetes Father   . Cancer Daughter        breast ca x 2 s/p mastectomy    Social History   Socioeconomic History  . Marital status: Widowed    Spouse name: Not on file  . Number of children: 5  . Years of education: Not on file  . Highest education level: Not on file  Occupational History  . Occupation: retired  Tobacco Use  . Smoking status: Former Smoker    Packs/day: 0.50    Years: 20.00    Pack years: 10.00    Types: Cigarettes    Quit date: 09/20/1995    Years since quitting: 25.5  . Smokeless tobacco: Never Used  . Tobacco comment: quit 1996 smoked 20 years max 8 cig qd   Vaping Use  . Vaping Use: Never used  Substance and Sexual Activity  . Alcohol use: No  . Drug use: No  . Sexual activity: Not on file  Other Topics Concern  . Not on file  Social History Narrative   Lives at home with caretaker currently [name]    Has children    Likes to ride stationary bike and sew             Social Determinants of Health   Financial Resource Strain: Low Risk   . Difficulty of Paying Living Expenses: Not hard at all  Food Insecurity: No Food Insecurity  . Worried About Charity fundraiser in the Last Year: Never true  . Ran Out of Food in the Last Year: Never true  Transportation Needs: No Transportation Needs  . Lack of Transportation (Medical): No  . Lack of Transportation (Non-Medical): No  Physical  Activity: Insufficiently Active  . Days of Exercise per Week: 7 days  . Minutes of Exercise per  Session: 20 min  Stress: No Stress Concern Present  . Feeling of Stress : Not at all  Social Connections: Not on file  Intimate Partner Violence: Not on file   Current Meds  Medication Sig  . acetaminophen (TYLENOL) 325 MG tablet Take 2 tablets (650 mg total) by mouth every 6 (six) hours as needed.  Marland Kitchen acetaminophen-codeine (TYLENOL #3) 300-30 MG tablet Take 1 tablet by mouth 2 (two) times daily as needed for moderate pain.  Marland Kitchen albuterol (PROVENTIL) (2.5 MG/3ML) 0.083% nebulizer solution Take 2.5 mg by nebulization 3 (three) times daily.   . AMBULATORY NON FORMULARY MEDICATION Medication Name: Please dispense flutter valve to use three times daily after nebulization. DX:J47.9  . Azelaic Acid 15 % gel Apply 1 application topically 2 (two) times daily. After skin is thoroughly washed and patted dry, gently but thoroughly massage a thin film of azelaic acid cream into the affected area twice daily, in the morning and evening.  . bisacodyl (DULCOLAX) 5 MG EC tablet Take 1 tablet (5 mg total) by mouth daily as needed for moderate constipation.  . Calcium Carbonate-Vitamin D (CALCIUM 600+D) 600-400 MG-UNIT tablet Take 1 tablet by mouth 2 (two) times daily. Lunch and dinner  . clotrimazole-betamethasone (LOTRISONE) lotion SMARTSIG:In Ear(s)  . DOK 100 MG capsule TAKE 1 CAPSULE BY MOUTH TWICE DAILY  . furosemide (LASIX) 40 MG tablet Take 2 tablets (80 mg total) by mouth daily. May take 2nd dose 40 mg if needed at lunch for 5 days as needed  . gabapentin (NEURONTIN) 400 MG capsule Take 1 capsule (400 mg total) by mouth 3 (three) times daily.  Marland Kitchen guaiFENesin (MUCINEX) 600 MG 12 hr tablet Take 600 mg by mouth daily.   . hydrocortisone 2.5 % cream Apply topically 2 (two) times daily.  . Melatonin 10 MG TABS Take 10 mg by mouth at bedtime.  . Multiple Vitamin (MULTIVITAMIN WITH MINERALS) TABS tablet Take 1 tablet by mouth daily.   . mupirocin ointment (BACTROBAN) 2 % Apply 1 application topically 3 (three)  times daily. Right thumb  . polyethylene glycol powder (GLYCOLAX/MIRALAX) 17 GM/SCOOP powder Take 17 g by mouth daily.  . polyvinyl alcohol (LIQUIFILM TEARS) 1.4 % ophthalmic solution Place 1 drop into both eyes as needed for dry eyes.  . [DISCONTINUED] apixaban (ELIQUIS) 2.5 MG TABS tablet Take 1 tablet (2.5 mg total) by mouth 2 (two) times daily.  . [DISCONTINUED] atorvastatin (LIPITOR) 40 MG tablet Take 1 tablet (40 mg total) by mouth daily at 6 PM. Generic ok  . [DISCONTINUED] cetirizine (ZYRTEC) 10 MG tablet Take 1 tablet (10 mg total) by mouth daily.  . [DISCONTINUED] fluticasone (FLONASE) 50 MCG/ACT nasal spray Place 1-2 sprays into both nostrils daily. Max 2 sprays  . [DISCONTINUED] Fluticasone-Umeclidin-Vilant (TRELEGY ELLIPTA) 100-62.5-25 MCG/INH AEPB Inhale 1 puff into the lungs daily. Samples of this drug were given to the patient, quantity 1/14 doses, Lot Number [id number] nov 22 exp  . [DISCONTINUED] Fluticasone-Umeclidin-Vilant (TRELEGY ELLIPTA) 100-62.5-25 MCG/INH AEPB Inhale 1 puff into the lungs daily. Rinse mouth d/c symbicort and spiriva  . [DISCONTINUED] levothyroxine (SYNTHROID) 200 MCG tablet Take 1 tablet (200 mcg total) by mouth daily before breakfast. Except on Sunday. Do not take with other medications or vitamins  . [DISCONTINUED] mirtazapine (REMERON) 15 MG tablet Take 1 tablet (15 mg total) by mouth at bedtime.  . [DISCONTINUED] pantoprazole (PROTONIX) 40 MG tablet  Take 1 tablet (40 mg total) by mouth daily. 30 minutes before lunch or dinner  . [DISCONTINUED] sitaGLIPtin (JANUVIA) 25 MG tablet Take 1 tablet (25 mg total) by mouth daily.   Allergies  Allergen Reactions  . Penicillins Shortness Of Breath, Rash and Other (See Comments)    Has patient had a PCN reaction causing immediate rash, facial/tongue/throat swelling, SOB or lightheadedness with hypotension: Yes Has patient had a PCN reaction causing severe rash involving mucus membranes or skin necrosis: No Has patient  had a PCN reaction that required hospitalization No Has patient had a PCN reaction occurring within the last 10 years: No If all of the above answers are "NO", then may proceed with Cephalosporin use.  . Sulfa Antibiotics Shortness Of Breath, Rash and Other (See Comments)  . Amitiza [Lubiprostone]     N/v/d  . Aspirin Other (See Comments)    Reaction:  Unknown  Other reaction(s): Bleeding (intolerance) Per patient " causes nose to bleed" Can take 81 mg daily without any complications Other reaction(s): "bloody nose"   . Penicillin G Other (See Comments)    Has patient had a PCN reaction causing immediate rash, facial/tongue/throat swelling, SOB or lightheadedness with hypotension: No Has patient had a PCN reaction causing severe rash involving mucus membranes or skin necrosis: Unknown Has patient had a PCN reaction that required hospitalization: Unknown Has patient had a PCN reaction occurring within the last 10 years: Unknown If all of the above answers are "NO", then may proceed with Cephalosporin use.   No results found for this or any previous visit (from the past 2160 hour(s)). Objective  Body mass index is 31.17 kg/m. Wt Readings from Last 3 Encounters:  03/29/21 199 lb (90.3 kg)  11/11/20 196 lb 12.8 oz (89.3 kg)  09/29/20 195 lb (88.5 kg)   Temp Readings from Last 3 Encounters:  03/29/21 98 F (36.7 C) (Oral)  11/11/20 98.3 F (36.8 C) (Oral)  08/12/20 98.7 F (37.1 C) (Oral)   BP Readings from Last 3 Encounters:  03/29/21 110/74  11/11/20 124/80  08/12/20 126/80   Pulse Readings from Last 3 Encounters:  03/29/21 69  11/11/20 71  08/12/20 66    Physical Exam Vitals and nursing note reviewed.  Constitutional:      Appearance: Normal appearance. She is well-developed and well-groomed. She is obese.  HENT:     Head: Normocephalic and atraumatic.  Cardiovascular:     Rate and Rhythm: Normal rate and regular rhythm.     Heart sounds: Normal heart sounds. No  murmur heard.   Pulmonary:     Effort: Pulmonary effort is normal.     Breath sounds: Normal breath sounds.  Musculoskeletal:     Right lower leg: 2+ Pitting Edema present.     Left lower leg: 2+ Pitting Edema present.  Skin:    General: Skin is warm and dry.  Neurological:     General: No focal deficit present.     Mental Status: She is alert and oriented to person, place, and time. Mental status is at baseline.     Gait: Gait normal.  Psychiatric:        Attention and Perception: Attention and perception normal.        Mood and Affect: Mood and affect normal.        Speech: Speech normal.        Behavior: Behavior normal. Behavior is cooperative.        Thought Content: Thought content normal.  Cognition and Memory: Cognition and memory normal.     Assessment  Plan  Rosacea vs acneiform rash forehead and cheeks - Plan: Azelaic Acid 15 % gel bid Failed metrogel in the past   Open wound right thumb- Plan: mupirocin ointment (BACTROBAN) 2 %  Type 2 diabetes mellitus without complication, without long-term current use of insulin (HCC) - Plan: Comprehensive metabolic panel, CBC with Differential/Platelet, Hemoglobin A1c, Lipid panel Ok to stop [place] 25 mg qd   Hypothyroidism, unspecified type - Plan: TSH Levo 200 mcg qd   Stage 4 chronic kidney disease ([place]) - Plan: Ambulatory referral to Nephrology wants to see MD  Lymphedema - Plan: Ambulatory referral to Vascular Surgery pt wants to see Dr. Delana Meyer not Dew History of DVT of lower extremity - Plan: Ambulatory referral to Vascular Surgery  She may need lymph pumps   HM Fluutd prevnar had 09/20/16 pna 23 08/29/18 Tdap 08/04/16 shingrixnot had out of stock called pharmacy,? zostavaxif had MMR immune covid 3/3 pfizer consider 4th dose  [facility] Dr. Evorn Gong Out of age window pap, colonoscopy, mammo DEXA 01/01/08 osteopeniawill make sure on ca 600 bid and vit D 1000 iu qd -Vitamin d 03/15/18  48.1 -disc with patient and family at f/u if want to pursuein futureDEXA  [facility] Dr. Evorn Gong  [facility] [place] [employer]  As of 03/29/21 given MOST form to discuss with family   Provider: Dr. Olivia Mackie McLean-Scocuzza-Internal Medicine

## 2021-03-29 NOTE — Patient Instructions (Addendum)
We can STOP Januvia 25 mg daily   Rosacea Rosacea is a long-term (chronic) condition that affects the skin of the face, including the cheeks, nose, forehead, and chin. This condition can also affect the eyes. Rosacea causes blood vessels near the surface of the skin to enlarge, which results in redness. What are the causes? The cause of this condition is not known. Certain triggers can make rosacea worse, including:  Hot baths.  Exercise.  Sunlight.  Very hot or cold temperatures.  Hot or spicy foods and drinks.  Drinking alcohol.  Stress.  Taking blood pressure medicine.  Long-term use of topical steroids on the face. What increases the risk? You are more likely to develop this condition if you:  Are older than 85 years of age.  Are a woman.  Have light-colored skin (light complexion).  Have a family history of rosacea. What are the signs or symptoms? Symptoms of this condition include:  Redness of the face.  Red bumps or pimples on the face.  A red, enlarged nose.  Blushing easily.  Red lines on the skin.  Irritated, burning, or itchy feeling in the eyes.  Swollen eyelids.  Drainage from the eyes.  Feeling like there is something in your eye.   How is this diagnosed? This condition is diagnosed with a medical history and physical exam. How is this treated? There is no cure for this condition, but treatment can help to control your symptoms. Your health care provider may recommend that you see a skin specialist (dermatologist). Treatment may include:  Medicines that are applied to the skin or taken by mouth (orally). This can include antibiotic medicines.  Laser treatment to improve the appearance of the skin.  Surgery. This is rare. Your health care provider will also recommend the best way to take care of your skin. Even after your skin improves, you will likely need to continue treatment to prevent your rosacea from coming back. Follow these  instructions at home: Skin care Take care of your skin as told by your health care provider. You may be told to do these things:  Wash your skin gently two or more times each day.  Use mild soap.  Use a sunscreen or sunblock with SPF 30 or greater.  Use gentle cosmetics that are meant for sensitive skin.  Shave with an electric shaver instead of a blade. Lifestyle  Try to keep track of what foods trigger this condition. Avoid any triggers. These may include: ? Spicy foods. ? Seafood. ? Cheese. ? Hot liquids. ? Nuts. ? Chocolate. ? Iodized salt.  Do not drink alcohol.  Avoid extremely cold or hot temperatures.  Try to reduce your stress. If you need help, talk with your health care provider.  When you exercise, do these things to stay cool: ? Limit sun exposure to your face. ? Use a fan. ? Do shorter and more frequent intervals of exercise. General instructions  Take and apply over-the-counter and prescription medicines only as told by your health care provider.  If you were prescribed an antibiotic medicine, apply it or take it as told by your health care provider. Do not stop using the antibiotic even if your condition improves.  If your eyelids are affected, apply warm compresses to them. Do this as told by your health care provider.  Keep all follow-up visits as told by your health care provider. This is important. Contact a health care provider if:  Your symptoms get worse.  Your symptoms do  not improve after 2 months of treatment.  You have new symptoms.  You have any changes in vision or you have problems with your eyes, such as redness or itching.  You feel depressed.  You lose your appetite.  You have trouble concentrating. Summary  Rosacea is a long-term (chronic) condition that affects the skin of the face, including the cheeks, nose, forehead, and chin.  Take care of your skin as told by your health care provider.  Take and apply  over-the-counter and prescription medicines only as told by your health care provider.  Contact a health care provider if your symptoms get worse or if you have any changes in vision or other problems with your eyes, such as redness or itching.  Keep all follow-up visits as told by your health care provider. This is important. This information is not intended to replace advice given to you by your health care provider. Make sure you discuss any questions you have with your health care provider. Document Revised: 04/24/2018 Document Reviewed: 04/24/2018 Elsevier Patient Education  2021 [employer].  Azelaic Acid skin gel What is this medicine? AZELAIC ACID (ay ze LAY ik AS id) is used on the skin to treat mild to moderate rosacea. This medicine may be used for other purposes; ask your health care provider or pharmacist if you have questions. COMMON BRAND NAME(S): Finacea What should I tell my health care provider before I take this medicine? They need to know if you have any of these conditions:  herpes or cold sores  an unusual or allergic reaction to propylene glycol, azelaic acid, other medicines, foods, dyes, or preservatives  pregnant or trying to get pregnant  breast-feeding How should I use this medicine? This medicine is for external use only. Do not take by mouth. Follow the directions on the prescription label. Wash your hands before and after use. Make sure the skin is clean and dry. Apply just enough gel to cover the affected area. Rub in gently but completely. Keep the gel away from the eyes, mouth, and other areas of sensitive skin. If accidental contact occurs, large amounts of water should be used to rinse the affected area. If your eyes are still irritated, contact your doctor or health care professional. Do not place a bandage, wrap, or dressing on top of skin where azelaic acid has been applied. If you are using other topical medicines, apply them at different times of  the day. The gel is available in a tube and a pump. With the first use, the pump may need to be repeatedly depressed before any gel is dispensed. Throw away the pump 8 weeks after opening. Talk to your pediatrician regarding the use of this medicine in children. Special care may be needed. Overdosage: If you think you have taken too much of this medicine contact a poison control center or emergency room at once. NOTE: This medicine is only for you. Do not share this medicine with others. What if I miss a dose? If you miss a dose, use it as soon as you can. If it is almost time for your next dose, use only that dose. Do not use double or extra doses. What may interact with this medicine? Interactions are not expected. Do not use any other skin products without telling your doctor or health care professional. This list may not describe all possible interactions. Give your health care provider a list of all the medicines, herbs, non-prescription drugs, or dietary supplements you use. Also  tell them if you smoke, drink alcohol, or use illegal drugs. Some items may interact with your medicine. What should I watch for while using this medicine? Contact your doctor or health care professional if your skin condition gets worse or does not get better in the first 4 weeks or if the medicine causes too much redness, dryness, or peeling of skin. It may take longer than 4 weeks before you see the full effect. Small amounts of water based cosmetics may be used while using this medicine. Apply cosmetics after this medicine has dried. Avoid eating or drinking foods or beverages that may make redness, flushing, and blushing worse. Examples include spicy foods, alcohol, hot coffee, or hot tea. What side effects may I notice from receiving this medicine? Side effects that you should report to your doctor or health care professional as soon as possible:  allergic reactions like skin rash, itching or hives, swelling of  the face, lips, or tongue  lightening of the treated areas (in patients with dark complexions)  severe burning, itching, crusting, or swelling of the treated areas Side effects that usually do not require medical attention (report to your doctor or health care professional if they continue or are bothersome):  dry skin  mild burning, itching, or stinging  reddening of the skin  skin peeling This list may not describe all possible side effects. Call your doctor for medical advice about side effects. You may report side effects to FDA at 1-800-FDA-1088. Where should I keep my medicine? Keep out of the reach of children. Store at room temperature between 15 and 30 degrees C (59 and 86 degrees F). Do not freeze. NOTE: This sheet is a summary. It may not cover all possible information. If you have questions about this medicine, talk to your doctor, pharmacist, or health care provider.  2021 Elsevier/Gold Standard (2015-07-13 15:54:31)

## 2021-03-30 ENCOUNTER — Telehealth: Payer: Self-pay | Admitting: Internal Medicine

## 2021-03-30 ENCOUNTER — Other Ambulatory Visit: Payer: Self-pay | Admitting: Internal Medicine

## 2021-03-30 ENCOUNTER — Ambulatory Visit: Payer: Medicare PPO | Admitting: Internal Medicine

## 2021-03-30 DIAGNOSIS — L719 Rosacea, unspecified: Secondary | ICD-10-CM

## 2021-03-30 LAB — COMPREHENSIVE METABOLIC PANEL
ALT: 15 U/L (ref 0–35)
AST: 21 U/L (ref 0–37)
Albumin: 3.8 g/dL (ref 3.5–5.2)
Alkaline Phosphatase: 69 U/L (ref 39–117)
BUN: 22 mg/dL (ref 6–23)
CO2: 33 mEq/L — ABNORMAL HIGH (ref 19–32)
Calcium: 8.9 mg/dL (ref 8.4–10.5)
Chloride: 99 mEq/L (ref 96–112)
Creatinine, Ser: 1.49 mg/dL — ABNORMAL HIGH (ref 0.40–1.20)
GFR: 30.22 mL/min — ABNORMAL LOW (ref >60.00)
Glucose, Bld: 109 mg/dL — ABNORMAL HIGH (ref 70–99)
Potassium: 4 mEq/L (ref 3.5–5.1)
Sodium: 143 mEq/L (ref 135–145)
Total Bilirubin: 0.7 mg/dL (ref 0.2–1.2)
Total Protein: 6.9 g/dL (ref 6.0–8.3)

## 2021-03-30 LAB — CBC WITH DIFFERENTIAL/PLATELET
Basophils Absolute: 0 10*3/uL (ref 0.0–0.1)
Basophils Relative: 0.6 % (ref 0.0–3.0)
Eosinophils Absolute: 0.2 10*3/uL (ref 0.0–0.7)
Eosinophils Relative: 2.8 % (ref 0.0–5.0)
HCT: 43.3 % (ref 36.0–46.0)
Hemoglobin: 14.4 g/dL (ref 12.0–15.0)
Lymphocytes Relative: 23.7 % (ref 12.0–46.0)
Lymphs Abs: 1.6 10*3/uL (ref 0.7–4.0)
MCHC: 33.3 g/dL (ref 30.0–36.0)
MCV: 92 fl (ref 78.0–100.0)
Monocytes Absolute: 0.6 10*3/uL (ref 0.1–1.0)
Monocytes Relative: 9.4 % (ref 3.0–12.0)
Neutro Abs: 4.3 10*3/uL (ref 1.4–7.7)
Neutrophils Relative %: 63.5 % (ref 43.0–77.0)
Platelets: 173 10*3/uL (ref 150.0–400.0)
RBC: 4.71 Mil/uL (ref 3.87–5.11)
RDW: 15 % (ref 11.5–15.5)
WBC: 6.8 10*3/uL (ref 4.0–10.5)

## 2021-03-30 LAB — LIPID PANEL
Cholesterol: 133 mg/dL (ref 0–200)
HDL: 48.3 mg/dL (ref >39.00)
LDL Cholesterol: 62 mg/dL (ref 0–99)
NonHDL: 84.82
Total CHOL/HDL Ratio: 3
Triglycerides: 115 mg/dL (ref 0.0–149.0)
VLDL: 23 mg/dL (ref 0.0–40.0)

## 2021-03-30 LAB — HEMOGLOBIN A1C: Hgb A1c MFr Bld: 6.4 % (ref 4.6–6.5)

## 2021-03-30 LAB — TSH: TSH: 1.33 u[IU]/mL (ref 0.35–4.50)

## 2021-03-30 NOTE — Telephone Encounter (Signed)
Please advise 

## 2021-03-30 NOTE — Telephone Encounter (Signed)
Caretaker called to inform that the Azelaic Acid 15 % gel is too expensive and wanted to know if there was something less expensive to be prescribed. Please call back at [phone].

## 2021-03-30 NOTE — Telephone Encounter (Signed)
Caretaker called back to find out if there would be another cream called in in place of the Azelaic Acid 15% gel. Please advise.Wants to know if she will find out before eod.

## 2021-03-30 NOTE — Telephone Encounter (Signed)
Called and informed that message has been sent to Dr Olivia Mackie McLean-Scocuzza. Informed that she has been in clinic with patient's but will get to the message as soon as she could. Patient caregiver verbalized understanding.   Please advise

## 2021-03-30 NOTE — Telephone Encounter (Signed)
We have to do a PA tried metrogel in the past w/o help to get insurance to help cover more of cost of azelaic acid   Inform pt

## 2021-03-31 NOTE — Telephone Encounter (Signed)
Prior authorization has been submitted for patient's Azelaic Acid 15% gel  Awaiting approval or denial.   Patient's caregiver informed

## 2021-04-01 NOTE — Telephone Encounter (Signed)
Left message to return call. Prior authorization has been approved, okay to inform Patient or caregiver when calling back in.

## 2021-04-06 ENCOUNTER — Ambulatory Visit: Payer: Medicare PPO | Admitting: Internal Medicine

## 2021-04-06 ENCOUNTER — Telehealth: Payer: Self-pay | Admitting: Internal Medicine

## 2021-04-06 NOTE — Telephone Encounter (Signed)
Miralax daily as needed with colace daily as needed over the counter  Warm prune juice

## 2021-04-06 NOTE — Telephone Encounter (Signed)
Patient's caregiver mary informed that this was already send in and the prior Auth done was approved.  Mary verbalized understanding.

## 2021-04-06 NOTE — Telephone Encounter (Signed)
Patient's caregiver Stanton Kidney  informed and verbalized understanding.

## 2021-04-06 NOTE — Telephone Encounter (Signed)
Why does she need refill azelaic acid this was just refilled

## 2021-04-06 NOTE — Telephone Encounter (Signed)
Please advise 

## 2021-04-06 NOTE — Telephone Encounter (Signed)
Patient's daughter called in stated that her mother told her she has not had a bowel movement in two weeks wanted to know if Dr.Mclean could suggest something for her daughter call back number is [phone]

## 2021-04-06 NOTE — Telephone Encounter (Signed)
Patient is requesting a refill on her medication for face, it is a cream. She could not tell the healthcare worker the name of the medication for her face.

## 2021-04-07 NOTE — Telephone Encounter (Signed)
Patient caregiver informed and verbalized understanding

## 2021-04-07 NOTE — Telephone Encounter (Signed)
Pt daughter called back to let Dr. Olivia Mackie know that her mother had a bowel movement this morning

## 2021-04-19 ENCOUNTER — Other Ambulatory Visit: Payer: Self-pay

## 2021-04-19 MED ORDER — MELATONIN 10 MG PO TABS: 10.0000 mg | ORAL_TABLET | Freq: Every evening | ORAL | 1 refills | Status: DC

## 2021-04-26 ENCOUNTER — Telehealth: Payer: Self-pay | Admitting: Internal Medicine

## 2021-04-26 ENCOUNTER — Other Ambulatory Visit: Payer: Self-pay

## 2021-04-26 ENCOUNTER — Emergency Department: Payer: Medicare PPO

## 2021-04-26 ENCOUNTER — Emergency Department
Admission: EM | Admit: 2021-04-26 | Discharge: 2021-04-27 | Disposition: A | Discharge status: Home / Self Care | Payer: Medicare PPO | Attending: Emergency Medicine | Admitting: Emergency Medicine

## 2021-04-26 ENCOUNTER — Encounter: Payer: Self-pay | Admitting: Emergency Medicine

## 2021-04-26 DIAGNOSIS — N184 Chronic kidney disease, stage 4 (severe): Secondary | ICD-10-CM | POA: Diagnosis not present

## 2021-04-26 DIAGNOSIS — Z7951 Long term (current) use of inhaled steroids: Secondary | ICD-10-CM | POA: Diagnosis not present

## 2021-04-26 DIAGNOSIS — R600 Localized edema: Secondary | ICD-10-CM | POA: Diagnosis not present

## 2021-04-26 DIAGNOSIS — Z87891 Personal history of nicotine dependence: Secondary | ICD-10-CM | POA: Insufficient documentation

## 2021-04-26 DIAGNOSIS — E1122 Type 2 diabetes mellitus with diabetic chronic kidney disease: Secondary | ICD-10-CM | POA: Insufficient documentation

## 2021-04-26 DIAGNOSIS — Z7984 Long term (current) use of oral hypoglycemic drugs: Secondary | ICD-10-CM | POA: Diagnosis not present

## 2021-04-26 DIAGNOSIS — J45909 Unspecified asthma, uncomplicated: Secondary | ICD-10-CM | POA: Insufficient documentation

## 2021-04-26 DIAGNOSIS — J449 Chronic obstructive pulmonary disease, unspecified: Secondary | ICD-10-CM | POA: Insufficient documentation

## 2021-04-26 DIAGNOSIS — J9811 Atelectasis: Secondary | ICD-10-CM | POA: Diagnosis not present

## 2021-04-26 DIAGNOSIS — Z85828 Personal history of other malignant neoplasm of skin: Secondary | ICD-10-CM | POA: Insufficient documentation

## 2021-04-26 DIAGNOSIS — Z7901 Long term (current) use of anticoagulants: Secondary | ICD-10-CM | POA: Insufficient documentation

## 2021-04-26 DIAGNOSIS — E039 Hypothyroidism, unspecified: Secondary | ICD-10-CM | POA: Diagnosis not present

## 2021-04-26 DIAGNOSIS — Z79899 Other long term (current) drug therapy: Secondary | ICD-10-CM | POA: Diagnosis not present

## 2021-04-26 DIAGNOSIS — I509 Heart failure, unspecified: Secondary | ICD-10-CM | POA: Insufficient documentation

## 2021-04-26 DIAGNOSIS — R609 Edema, unspecified: Secondary | ICD-10-CM

## 2021-04-26 DIAGNOSIS — I2510 Atherosclerotic heart disease of native coronary artery without angina pectoris: Secondary | ICD-10-CM | POA: Insufficient documentation

## 2021-04-26 DIAGNOSIS — I130 Hypertensive heart and chronic kidney disease with heart failure and stage 1 through stage 4 chronic kidney disease, or unspecified chronic kidney disease: Secondary | ICD-10-CM | POA: Diagnosis not present

## 2021-04-26 DIAGNOSIS — E1140 Type 2 diabetes mellitus with diabetic neuropathy, unspecified: Secondary | ICD-10-CM | POA: Insufficient documentation

## 2021-04-26 DIAGNOSIS — Z20822 Contact with and (suspected) exposure to covid-19: Secondary | ICD-10-CM | POA: Insufficient documentation

## 2021-04-26 DIAGNOSIS — R0602 Shortness of breath: Secondary | ICD-10-CM | POA: Diagnosis not present

## 2021-04-26 DIAGNOSIS — R0600 Dyspnea, unspecified: Secondary | ICD-10-CM | POA: Diagnosis not present

## 2021-04-26 LAB — COMPREHENSIVE METABOLIC PANEL
ALT: 18 U/L (ref 0–44)
AST: 25 U/L (ref 15–41)
Albumin: 3.7 g/dL (ref 3.5–5.0)
Alkaline Phosphatase: 58 U/L (ref 38–126)
Anion gap: 11 (ref 5–15)
BUN: 19 mg/dL (ref 8–23)
CO2: 31 mmol/L (ref 22–32)
Calcium: 8.5 mg/dL — ABNORMAL LOW (ref 8.9–10.3)
Chloride: 98 mmol/L (ref 98–111)
Creatinine, Ser: 1.24 mg/dL — ABNORMAL HIGH (ref 0.44–1.00)
GFR, Estimated: 41 mL/min — ABNORMAL LOW (ref >60)
Glucose, Bld: 99 mg/dL (ref 70–99)
Potassium: 3.5 mmol/L (ref 3.5–5.1)
Sodium: 140 mmol/L (ref 135–145)
Total Bilirubin: 0.9 mg/dL (ref 0.3–1.2)
Total Protein: 7.1 g/dL (ref 6.5–8.1)

## 2021-04-26 LAB — CBC WITH DIFFERENTIAL/PLATELET
Abs Immature Granulocytes: 0.03 10*3/uL (ref 0.00–0.07)
Basophils Absolute: 0 10*3/uL (ref 0.0–0.1)
Basophils Relative: 1 %
Eosinophils Absolute: 0.2 10*3/uL (ref 0.0–0.5)
Eosinophils Relative: 3 %
HCT: 43.1 % (ref 36.0–46.0)
Hemoglobin: 13.8 g/dL (ref 12.0–15.0)
Immature Granulocytes: 0 %
Lymphocytes Relative: 25 %
Lymphs Abs: 1.8 10*3/uL (ref 0.7–4.0)
MCH: 30.1 pg (ref 26.0–34.0)
MCHC: 32 g/dL (ref 30.0–36.0)
MCV: 93.9 fL (ref 80.0–100.0)
Monocytes Absolute: 0.7 10*3/uL (ref 0.1–1.0)
Monocytes Relative: 10 %
Neutro Abs: 4.5 10*3/uL (ref 1.7–7.7)
Neutrophils Relative %: 61 %
Platelets: 184 10*3/uL (ref 150–400)
RBC: 4.59 MIL/uL (ref 3.87–5.11)
RDW: 14.5 % (ref 11.5–15.5)
WBC: 7.4 10*3/uL (ref 4.0–10.5)
nRBC: 0 % (ref 0.0–0.2)

## 2021-04-26 LAB — TROPONIN I (HIGH SENSITIVITY): Troponin I (High Sensitivity): 8 ng/L (ref <18)

## 2021-04-26 LAB — BRAIN NATRIURETIC PEPTIDE: B Natriuretic Peptide: 69.6 pg/mL (ref 0.0–100.0)

## 2021-04-26 MED ORDER — APIXABAN 2.5 MG PO TABS
2.5000 mg | ORAL_TABLET | Freq: Once | ORAL | Status: AC
  Administered 2021-04-27: 2.5 mg via ORAL
  Filled 2021-04-26: qty 1

## 2021-04-26 MED ORDER — FUROSEMIDE 10 MG/ML IJ SOLN
80.0000 mg | Freq: Once | INTRAMUSCULAR | Status: AC
  Administered 2021-04-26: 80 mg via INTRAVENOUS
  Filled 2021-04-26: qty 8

## 2021-04-26 MED ORDER — FUROSEMIDE 40 MG PO TABS: 40.0000 mg | ORAL_TABLET | Freq: Every day | ORAL | 0 refills | Status: DC

## 2021-04-26 MED ORDER — GABAPENTIN 400 MG PO CAPS
400.0000 mg | ORAL_CAPSULE | Freq: Once | ORAL | Status: AC
  Administered 2021-04-27: 400 mg via ORAL
  Filled 2021-04-26: qty 1

## 2021-04-26 NOTE — Telephone Encounter (Signed)
What would yo advise patient due to age? Would you want me to try to have her do a VV with maybe a caregiver or have her go to UC?

## 2021-04-26 NOTE — Telephone Encounter (Signed)
FYI I spoke with son [name]. He was not aware of all this going on. He said that he would call patient's caregiver that was with her now & see if they would take or he would to one of the walk-in clinics. He would prefer her seen here, but I explained due to no sooner availability & her symptoms she needed to be seen asap. I gave him [place] [facility] wait times & suggest [facility] walk-in, [facility] or [facility] ED, which he said no to.ED. He will update [place] on patient.

## 2021-04-26 NOTE — Discharge Instructions (Addendum)
Continue taking the 80 mg of Lasix in the morning.  For the next 5 days take an additional 40 mg of Lasix (which has been prescribed) in the evening.  Return to the ER for new, worsening, or persistent severe shortness of breath, leg swelling, weakness, or any other new or worsening symptoms that concern you.

## 2021-04-26 NOTE — ED Notes (Signed)
Pt placed on 2L via [place].  

## 2021-04-26 NOTE — ED Provider Notes (Signed)
[facility] Emergency Department Provider Note ____________________________________________   Event Date/Time   First MD Initiated Contact with Patient 04/26/21 2121     (approximate)  I have reviewed the triage vital signs and the nursing notes.   HISTORY  Chief Complaint Shortness of Breath  Level 5 caveat: History of present illness limited due to hearing impairment.  Primary history obtained via caregiver  HPI Jessica Erickson is a 87 y.o. female with PMH as noted below including CAD, COPD, and chronic edema who presents with worsening leg edema and some shortness of breath over the last several days.  She has had no significant pain.  The caregiver reports that the patient is on 80 mg of Lasix daily and it is not helping with the current edema.  She also reports that the patient had a recent birthday celebration a few days ago and so the family is concerned whether she could have been exposed to [place].  Past Medical History:  Diagnosis Date  . Acoustic neuroma ([place])   . Allergy   . Asthma   . Bilateral swelling of feet    and legs  . Bladder infection   . CAD (coronary artery disease)   . Cataract   . Change in voice   . Compression fracture of body of thoracic vertebra (HCC)    T12 09/18/15 MRI s/p fall   . Constipation   . COPD (chronic obstructive pulmonary disease) ([place])    previous CXR with chronic interstitial lung dz   . CVA (cerebral vascular accident) ([place])   . Depression   . Diabetes ([place])    with neuropathy  . Diabetes mellitus, type 2 ([place])   . Diarrhea   . Double vision   . DVT (deep venous thrombosis) ([place])    right leg 10/2015 was on coumadin off as of 2017/2018 ; s/p IVC filter  . Enuresis   . Eye pain, right   . Fall   . Fatty liver    09/15/15 also mildly dilated pancreatitic duct rec MRCP small sub cm cyst hemangioma speeln mild right hydronephrorossi and prox. hydroureter, kidney stones, mild scarring kidneys  . Female  stress incontinence   . Flank pain   . GERD (gastroesophageal reflux disease)    with small hiatal hernia   . Hard of hearing   . Heart disease   . History of kidney problems   . Hyperlipidemia    mixed  . Hypertension   . Hypothyroidism, postsurgical   . Impaired mobility and ADLs    uses rolling walker has caretaker 24/7 at home  . Leg edema   . Mixed incontinence urge and stress (female)(female)   . Neuropathy   . Osteoarthritis    DDD spine   . Osteoporosis with fracture    T12 compression fracture  . Photophobia   . Pulmonary embolism ([place])    10/2015 off coumadin as of 04/2016  . Pulmonary HTN (HCC)    mild pulm HTN, echo 10/09/15 EF 09-62%EZMOQ 1 dd, RV systolic pressure increased   . Recurrent UTI   . Sinus pressure   . Skin cancer    BCC jawline and scalp   . Thyroid disease    follows [facility]  . TIA (transient ischemic attack)    MRI 2009/2010 neg stroke   . Trigeminal neuralgia    Dr. Tomi Bamberger s/p gamma knife x 2, on Tegretol since 2011/2012 no increase in dose >200 mg bid rec per family  per neurology   . Urinary frequency   . Urinary, incontinence, stress female    Dr Erlene Quan urology     Patient Active Problem List   Diagnosis Date Noted  . Stage 4 chronic kidney disease ([place]) 03/29/2021  . Grade I diastolic dysfunction 28/31/5176  . Obesity (BMI 30-39.9) 08/16/2020  . Chronic kidney disease, stage 3b ([place]) 04/06/2020  . Thrombocytopenia ([place]) 04/06/2020  . Seborrheic keratoses 04/06/2020  . Hip pain 09/30/2019  . History of deep vein thrombosis (DVT) of lower extremity 09/30/2019  . Arthritis 09/30/2019  . Fall 03/11/2019  . Epistaxis 12/11/2018  . DVT (deep venous thrombosis) ([place]) 11/12/2018  . Bilateral leg edema 06/04/2018  . Bilateral hearing loss 06/04/2018  . Actinic keratosis 06/04/2018  . Bronchiectasis ([place]) 06/04/2018  . Depression 05/17/2018  . Dehydration 05/15/2018  . Hypertension 05/03/2018  . Lymphedema 05/02/2018  . Iron  deficiency anemia 05/02/2018  . Physical deconditioning 05/02/2018  . Drug interaction 05/02/2018  . Supratherapeutic INR 05/02/2018  . Presence of IVC filter 05/02/2018  . Anemia 04/11/2018  . Fatigue 04/11/2018  . Moderate protein-calorie malnutrition ([place]) 03/22/2018  . Orthostasis 03/05/2018  . Dizziness 03/04/2018  . COPD (chronic obstructive pulmonary disease) ([place]) 12/13/2017  . Anxiety and depression 12/13/2017  . Trigeminal neuralgia 12/13/2017  . GERD (gastroesophageal reflux disease) 12/13/2017  . Type 2 diabetes mellitus with stage 3b chronic kidney disease, without long-term current use of insulin ([place]) 12/13/2017  . Constipation 12/13/2017  . Insomnia 12/13/2017  . Hypothyroidism 12/13/2017  . UTI (urinary tract infection) 10/08/2015  . Low back pain 10/08/2015  . Collapsed vertebra, not elsewhere classified, thoracic region, initial encounter for fracture ([place]) 09/20/2015  . Basal cell carcinoma of scalp 04/06/2014  . Fothergill's neuralgia 08/05/2013  . Bladder infection, chronic 02/11/2013  . Female genuine stress incontinence 02/11/2013  . Incomplete bladder emptying 02/11/2013  . Intrinsic sphincter deficiency 02/11/2013  . Mixed incontinence 02/11/2013  . Excessive urination at night 02/11/2013  . Bladder retention 02/11/2013  . FOM (frequency of micturition) 02/11/2013  . Basal cell carcinoma of face 09/13/2011    Past Surgical History:  Procedure Laterality Date  . APPENDECTOMY     as a child, open  . BRAIN SURGERY     schwnnoma removal 1996   . brain tumor surgery    . BREAST SURGERY     breast bx  . CATARACT EXTRACTION    . CHOLECYSTECTOMY    . EYE SURGERY     cataract  . IVC FILTER PLACEMENT ([facility] HX)     Dr. Lucky Cowboy 10/2015   . LAPAROSCOPIC TUBAL LIGATION    . MOHS SURGERY     scalp 04/2014   . PERIPHERAL VASCULAR CATHETERIZATION N/A 10/11/2015   Procedure: IVC Filter Insertion;  Surgeon: Algernon Huxley, MD;  Location: [facility];   Service: Cardiovascular;  Laterality: N/A;  . PERIPHERAL VASCULAR THROMBECTOMY Bilateral 03/29/2018   Procedure: PERIPHERAL VASCULAR THROMBECTOMY;  Surgeon: Algernon Huxley, MD;  Location: [facility];  Service: Cardiovascular;  Laterality: Bilateral;  . PUBOVAGINAL SLING    . THROAT SURGERY    . THYROID SURGERY     tumor around vocal cords   . TOOTH EXTRACTION     winter 2018   . TOTAL THYROIDECTOMY  1976    Prior to Admission medications   Medication Sig Start Date End Date Taking? Authorizing Provider  acetaminophen (TYLENOL) 325 MG tablet Take 2 tablets (650 mg total) by mouth every 6 (  six) hours as needed. 07/30/20  Yes McLean-Scocuzza, Nino Glow, MD  apixaban (ELIQUIS) 2.5 MG TABS tablet Take 1 tablet (2.5 mg total) by mouth 2 (two) times daily. 03/29/21  Yes McLean-Scocuzza, Nino Glow, MD  atorvastatin (LIPITOR) 40 MG tablet Take 1 tablet (40 mg total) by mouth daily at 6 PM. Generic ok 03/29/21  Yes McLean-Scocuzza, Nino Glow, MD  Azelaic Acid 15 % gel Apply 1 application topically 2 (two) times daily. After skin is thoroughly washed and patted dry, gently but thoroughly massage a thin film of azelaic acid cream into the affected area twice daily, in the morning and evening. 03/29/21  Yes McLean-Scocuzza, Nino Glow, MD  Calcium Carbonate-Vitamin D (CALCIUM 600+D) 600-400 MG-UNIT tablet Take 1 tablet by mouth 2 (two) times daily. Lunch and dinner 07/23/19  Yes McLean-Scocuzza, Nino Glow, MD  cetirizine (ZYRTEC) 10 MG tablet Take 1 tablet (10 mg total) by mouth daily. 03/29/21  Yes McLean-Scocuzza, Nino Glow, MD  clotrimazole-betamethasone (LOTRISONE) lotion SMARTSIG:In Ear(s) 10/11/20  Yes [provider]  fluticasone (FLONASE) 50 MCG/ACT nasal spray Place 1-2 sprays into both nostrils daily. Max 2 sprays 03/29/21  Yes McLean-Scocuzza, Nino Glow, MD  Fluticasone-Umeclidin-Vilant (TRELEGY ELLIPTA) 100-62.5-25 MCG/INH AEPB Inhale 1 puff into the lungs daily. Rinse mouth d/c symbicort and spiriva  03/29/21  Yes McLean-Scocuzza, Nino Glow, MD  furosemide (LASIX) 40 MG tablet Take 2 tablets (80 mg total) by mouth daily. May take 2nd dose 40 mg if needed at lunch for 5 days as needed 10/05/20  Yes McLean-Scocuzza, Nino Glow, MD  furosemide (LASIX) 40 MG tablet Take 1 tablet (40 mg total) by mouth daily with supper for 5 days. 04/26/21 05/01/21 Yes Arta Silence, MD  gabapentin (NEURONTIN) 400 MG capsule Take 1 capsule (400 mg total) by mouth 3 (three) times daily. 11/11/20  Yes McLean-Scocuzza, Nino Glow, MD  hydrocortisone 2.5 % cream Apply topically 2 (two) times daily. 04/06/20  Yes McLean-Scocuzza, Nino Glow, MD  ipratropium-albuterol (DUONEB) 0.5-2.5 (3) MG/3ML SOLN Inhale 3 mLs into the lungs every 4 (four) hours as needed. 03/29/21  Yes McLean-Scocuzza, Nino Glow, MD  JANUVIA 25 MG tablet Take 25 mg by mouth daily. 04/02/21  Yes [provider]  levothyroxine (SYNTHROID) 200 MCG tablet Take 1 tablet (200 mcg total) by mouth daily before breakfast. Except on Sunday. Do not take with other medications or vitamins 03/29/21  Yes McLean-Scocuzza, Nino Glow, MD  Melatonin 10 MG TABS Take 10 mg by mouth at bedtime. 04/19/21  Yes McLean-Scocuzza, Nino Glow, MD  mirtazapine (REMERON) 15 MG tablet Take 1 tablet (15 mg total) by mouth at bedtime. 03/29/21  Yes McLean-Scocuzza, Nino Glow, MD  Multiple Vitamin (MULTIVITAMIN WITH MINERALS) TABS tablet Take 1 tablet by mouth daily.    Yes [provider]  mupirocin ointment (BACTROBAN) 2 % Apply 1 application topically 3 (three) times daily. Right thumb 03/29/21  Yes McLean-Scocuzza, Nino Glow, MD  pantoprazole (PROTONIX) 40 MG tablet Take 1 tablet (40 mg total) by mouth daily. 30 minutes before lunch or dinner 03/29/21  Yes McLean-Scocuzza, Nino Glow, MD  polyethylene glycol powder (GLYCOLAX/MIRALAX) 17 GM/SCOOP powder Take 17 g by mouth daily. 04/06/20  Yes McLean-Scocuzza, Nino Glow, MD  polyvinyl alcohol (LIQUIFILM TEARS) 1.4 % ophthalmic solution Place 1 drop into  both eyes as needed for dry eyes. 04/06/20  Yes McLean-Scocuzza, Nino Glow, MD  acetaminophen-codeine (TYLENOL #3) 300-30 MG tablet Take 1 tablet by mouth 2 (two) times daily as needed for moderate pain. Patient not taking:  No sig reported 04/14/20   McLean-Scocuzza, Nino Glow, MD  albuterol (PROVENTIL) (2.5 MG/3ML) 0.083% nebulizer solution Take 2.5 mg by nebulization 3 (three) times daily.     [provider]  AMBULATORY NON FORMULARY MEDICATION Medication Name: Please dispense flutter valve to use three times daily after nebulization. DX:J47.9 05/14/18   Laverle Hobby, MD  bisacodyl (DULCOLAX) 5 MG EC tablet Take 1 tablet (5 mg total) by mouth daily as needed for moderate constipation. 04/27/18   McLean-Scocuzza, Nino Glow, MD  DOK 100 MG capsule TAKE 1 CAPSULE BY MOUTH TWICE DAILY 07/15/19   McLean-Scocuzza, Nino Glow, MD  guaiFENesin (MUCINEX) 600 MG 12 hr tablet Take 600 mg by mouth daily.  Patient not taking: No sig reported    [provider]    Allergies Penicillins, Sulfa antibiotics, Amitiza [lubiprostone], Aspirin, and Penicillin g  Family History  Problem Relation Age of Onset  . Heart disease Mother   . Diabetes Father   . Cancer Daughter        breast ca x 2 s/p mastectomy     Social History Social History   Tobacco Use  . Smoking status: Former Smoker    Packs/day: 0.50    Years: 20.00    Pack years: 10.00    Types: Cigarettes    Quit date: 09/20/1995    Years since quitting: 25.6  . Smokeless tobacco: Never Used  . Tobacco comment: quit 1996 smoked 20 years max 8 cig qd   Vaping Use  . Vaping Use: Never used  Substance Use Topics  . Alcohol use: No  . Drug use: No    Review of Systems Level 5 caveat: Review of systems limited due to hearing impairment Constitutional: No fever. Cardiovascular: Denies chest pain. Respiratory: Positive for shortness of breath. Gastrointestinal: No vomiting or diarrhea.  Musculoskeletal: Negative for back  pain. Skin: Negative for rash. Neurological: Negative for headache.   ____________________________________________   PHYSICAL EXAM:  VITAL SIGNS: ED Triage Vitals  Enc Vitals Group     BP 04/26/21 1842 122/70     Pulse Rate 04/26/21 1842 69     Resp 04/26/21 1842 (!) 26     Temp 04/26/21 1842 97.8 F (36.6 C)     Temp Source 04/26/21 1842 Oral     SpO2 04/26/21 1842 91 %     Weight 04/26/21 1841 199 lb 1.2 oz (90.3 kg)     Height 04/26/21 1841 5\' 7"  (1.702 m)     Head Circumference --      Peak Flow --      Pain Score 04/26/21 1840 0     Pain Loc --      Pain Edu? --      Excl. in [place]? --     Constitutional: Alert and oriented. Well appearing for age, in no acute distress. Eyes: Conjunctivae are normal.  Head: Atraumatic. Nose: No congestion/rhinnorhea. Mouth/Throat: Mucous membranes are moist.   Neck: Normal range of motion.  Cardiovascular: Normal rate, regular rhythm. Grossly normal heart sounds.  Good peripheral circulation. Respiratory: Normal respiratory effort.  No retractions.  Slightly decreased breath sounds bilaterally.  No frank rales. Gastrointestinal: Soft and nontender. No distention.  Genitourinary: No flank tenderness. Musculoskeletal: 2+ bilateral lower extremity edema.  Extremities warm and well perfused.  Neurologic: Motor intact in all extremities. Skin:  Skin is warm and dry. No rash noted. Psychiatric: Mood and affect are normal. Speech and behavior are normal.  ____________________________________________   LABS (all labs  ordered are listed, but only abnormal results are displayed)  Labs Reviewed  COMPREHENSIVE METABOLIC PANEL - Abnormal; Notable for the following components:      Result Value   Creatinine, Ser 1.24 (*)    Calcium 8.5 (*)    GFR, Estimated 41 (*)    All other components within normal limits  RESP PANEL BY RT-PCR (FLU A&B, COVID) ARPGX2  CBC WITH DIFFERENTIAL/PLATELET  BRAIN NATRIURETIC PEPTIDE  TROPONIN I (HIGH  SENSITIVITY)   ____________________________________________  EKG  ED ECG REPORT I, Arta Silence, the attending physician, personally viewed and interpreted this ECG.  Date: 04/26/2021 EKG Time: 1845 Rate: 69 Rhythm: normal sinus rhythm QRS Axis: normal Intervals: normal ST/T Wave abnormalities: normal Narrative Interpretation: no evidence of acute ischemia  ____________________________________________  RADIOLOGY  Chest x-ray interpreted by me shows hyperinflation with no focal consolidation or edema  ____________________________________________   PROCEDURES  Procedure(s) performed: No  Procedures  Critical Care performed: No ____________________________________________   INITIAL IMPRESSION / ASSESSMENT AND PLAN / ED COURSE  Pertinent labs & imaging results that were available during my care of the patient were reviewed by me and considered in my medical decision making (see chart for details).  87 year old female with PMH as noted above including CAD, COPD and chronic edema presents with persistent and somewhat worsening lower extremity edema as well as shortness of breath over the last week, not improved with the 80 mg of Lasix she is taking daily.  She has no chest pain or other acute symptoms.  I reviewed the past medical records in [place].  The patient has no recent ED visits or admissions.  She has a history of provoked DVT and PE in 2016 after a fall and had an IVC filter placed.  She is on Eliquis.  It does not appear that she has a history of CHF.  On exam the patient is overall comfortable appearing.  Her vital signs are normal.  O2 saturation was slightly low at triage and she was put on 2 L by nasal cannula, however I took this off during my evaluation and her O2 saturation has remained in the mid 90s.  She has no respiratory distress or increased work of breathing and no significant rales.  There is moderate peripheral edema.  Overall presentation is  consistent with exacerbation of her chronic edema.  I doubt new onset CHF.  Patient has no fever, cough, or other symptoms to suggest COVID-19 or other infectious cause.  We will obtain a chest x-ray, lab work-up, and reassess.  ----------------------------------------- 11:53 PM on 04/26/2021 -----------------------------------------  The chest x-ray shows hypoinflation but no significant edema.  The lab work-up is within normal limits.  BNP is normal, and the troponin is negative.  EKG is nonischemic.  There is no indication for repeat based on the duration of the symptoms).  In discussion with the patient and caregiver, they expressed a desire for her to go home if at all possible.  Given that she is not requiring oxygen, has no respiratory distress, and otherwise reassuring labs, there is no indication for admission at this time.  I recommended giving an IV dose of Lasix here.  Per the caregiver the patient had apparently previously been on 80 mg of Lasix twice daily, although she is only taking it once currently.  I recommended adding an additional 40 mg of Lasix daily for the next 5 days  I have added on a COVID swab because of the concern for possible COVID exposure  at the patient's recent birthday celebration.  If the COVID swab is negative, plan will be for discharge home unless the patient has any new or worsening symptoms.  I have signed the patient out to the oncoming ED physician Dr. Nickolas Madrid.  ____________________________________________   FINAL CLINICAL IMPRESSION(S) / ED DIAGNOSES  Final diagnoses:  Peripheral edema  Shortness of breath      NEW MEDICATIONS STARTED DURING THIS VISIT:  New Prescriptions   FUROSEMIDE (LASIX) 40 MG TABLET    Take 1 tablet (40 mg total) by mouth daily with supper for 5 days.     Note:  This document was prepared using Dragon voice recognition software and may include unintentional dictation errors.   Arta Silence, MD 04/26/21  2355

## 2021-04-26 NOTE — ED Triage Notes (Signed)
Pt to ED via POV, pt's caregiver reports increased fatigue, SOB, and increased swelling to BLE over the last few days. Pt's caregiver reports pt takes 80mg  Lasix daily without relief of swelling. Pt reports feelins more SOB than normal. RA sats 91%.

## 2021-04-26 NOTE — Telephone Encounter (Signed)
[facility], [facility] or [facility] today asap Also [facility]  Prob. Needs labs and CXR and covid 19 test

## 2021-04-26 NOTE — Telephone Encounter (Signed)
PTs care giver Jessica Erickson called in about the PT having swelling in their legs and ankles that is causing them issues with getting up. They also are having congestion that might be allergy related as well as coughing and sneezing that is causing a rattling in her chest. She wants to do what can be done or taken for her to feel better and would like a callback.

## 2021-04-26 NOTE — Telephone Encounter (Signed)
Noted  

## 2021-04-27 LAB — RESP PANEL BY RT-PCR (FLU A&B, COVID) ARPGX2
Influenza A by PCR: NEGATIVE
Influenza B by PCR: NEGATIVE
SARS Coronavirus 2 by RT PCR: NEGATIVE

## 2021-04-28 ENCOUNTER — Other Ambulatory Visit: Payer: Self-pay

## 2021-04-28 ENCOUNTER — Encounter: Payer: Self-pay | Admitting: Podiatry

## 2021-04-28 ENCOUNTER — Ambulatory Visit (INDEPENDENT_AMBULATORY_CARE_PROVIDER_SITE_OTHER): Payer: Medicare PPO | Admitting: Podiatry

## 2021-04-28 DIAGNOSIS — D689 Coagulation defect, unspecified: Secondary | ICD-10-CM | POA: Diagnosis not present

## 2021-04-28 DIAGNOSIS — B351 Tinea unguium: Secondary | ICD-10-CM | POA: Diagnosis not present

## 2021-04-28 DIAGNOSIS — M79676 Pain in unspecified toe(s): Secondary | ICD-10-CM

## 2021-04-28 DIAGNOSIS — E1140 Type 2 diabetes mellitus with diabetic neuropathy, unspecified: Secondary | ICD-10-CM

## 2021-04-28 DIAGNOSIS — L84 Corns and callosities: Secondary | ICD-10-CM

## 2021-04-28 NOTE — Progress Notes (Signed)
This patient returns to my office for at risk foot care.  This patient requires this care by a professional since this patient will be at risk due to having history of DVT , coagulation defect and diabetes.  Patient is taking eliquis. This patient also has callus on her bunion right foot   This patient is unable to cut nails herself since the patient cannot reach her nails.These nails  and callus are painful walking and wearing shoes.  This patient presents for at risk foot care today.  General Appearance  Alert, conversant and in no acute stress.  Vascular  Dorsalis pedis and posterior tibial  pulses are weakly  palpable due to swelling  bilaterally.  Capillary return is within normal limits  bilaterally. Temperature is within normal limits  bilaterally.  Neurologic  Senn-Weinstein monofilament wire test within normal limits  bilaterally. Muscle power within normal limits bilaterally.  Nails Thick disfigured discolored nails with subungual debris  from hallux to fifth toes bilaterally. No evidence of bacterial infection or drainage bilaterally.  Orthopedic  No limitations of motion  feet .  No crepitus or effusions noted.  No bony pathology or digital deformities noted. HAV  Right.    Skin  normotropic skin with no porokeratosis noted bilaterally.  No signs of infections or ulcers noted.   Callus 1st MPJ right foot.  Onychomycosis  Pain in right toes  Pain in left toes  Callus right foot.  Consent was obtained for treatment procedures.   Mechanical debridement of nails 1-5  bilaterally performed with a nail nipper.  Filed with dremel without incident. Debridement of callus with dremel.   Return office visit   3 months                   Told patient to return for periodic foot care and evaluation due to potential at risk complications.   Juel Ripley DPM  

## 2021-05-03 ENCOUNTER — Ambulatory Visit (INDEPENDENT_AMBULATORY_CARE_PROVIDER_SITE_OTHER): Payer: Medicare PPO | Admitting: Internal Medicine

## 2021-05-03 ENCOUNTER — Other Ambulatory Visit: Payer: Self-pay

## 2021-05-03 ENCOUNTER — Encounter: Payer: Self-pay | Admitting: Internal Medicine

## 2021-05-03 VITALS — BP 110/70 | HR 55 | Temp 98.3°F | Ht 67.0 in | Wt 199.6 lb

## 2021-05-03 DIAGNOSIS — J471 Bronchiectasis with (acute) exacerbation: Secondary | ICD-10-CM

## 2021-05-03 DIAGNOSIS — R4182 Altered mental status, unspecified: Secondary | ICD-10-CM

## 2021-05-03 DIAGNOSIS — I890 Lymphedema, not elsewhere classified: Secondary | ICD-10-CM | POA: Diagnosis not present

## 2021-05-03 DIAGNOSIS — J449 Chronic obstructive pulmonary disease, unspecified: Secondary | ICD-10-CM

## 2021-05-03 DIAGNOSIS — R0600 Dyspnea, unspecified: Secondary | ICD-10-CM

## 2021-05-03 DIAGNOSIS — N1832 Chronic kidney disease, stage 3b: Secondary | ICD-10-CM | POA: Diagnosis not present

## 2021-05-03 DIAGNOSIS — R5383 Other fatigue: Secondary | ICD-10-CM

## 2021-05-03 DIAGNOSIS — J9601 Acute respiratory failure with hypoxia: Secondary | ICD-10-CM

## 2021-05-03 DIAGNOSIS — R0989 Other specified symptoms and signs involving the circulatory and respiratory systems: Secondary | ICD-10-CM | POA: Diagnosis not present

## 2021-05-03 DIAGNOSIS — I2721 Secondary pulmonary arterial hypertension: Secondary | ICD-10-CM | POA: Diagnosis not present

## 2021-05-03 DIAGNOSIS — R0902 Hypoxemia: Secondary | ICD-10-CM

## 2021-05-03 DIAGNOSIS — R600 Localized edema: Secondary | ICD-10-CM

## 2021-05-03 MED ORDER — FUROSEMIDE 80 MG PO TABS: 80.0000 mg | ORAL_TABLET | Freq: Every day | ORAL | Status: DC

## 2021-05-03 NOTE — Patient Instructions (Addendum)
Ive ordered chest cat scan and echo   Use flutter valve use daily   Please follow up with vascular Dr. Delana Meyer and the kidney doctors Keep lasix 80 in the am until you follow up with the kidney doctors  Use breathing treatments up to 4x per day and go to the pharmacy for further instruction of use   Miralax + colace as needed for constipation  Warm prune unsweetened    Can Stop Januvia 25 mg due to cost   Pulse oximeter $10 [employer]   Lymphedema  Lymphedema is swelling that is caused by the abnormal collection of lymph in the tissues under the skin. Lymph is excess fluid from the tissues in your body that is removed through the lymphatic system. This system is part of your body's defense system (immune system) and includes lymph nodes and lymph vessels. The lymph vessels collect and carry the excess fluid, fats, proteins, and waste from the tissues of the body to the bloodstream. This system also works to clean and remove bacteria and waste products from the body. Lymphedema occurs when the lymphatic system is blocked. When the lymph vessels or lymph nodes are blocked or damaged, lymph does not drain properly. This causes an abnormal buildup of lymph, which leads to swelling in the affected area. This may include the trunk area, or an arm or leg. Lymphedema cannot be cured by medicines, but various methods can be used to help reduce the swelling. What are the causes? The cause of this condition depends on the type of lymphedema that you have.  Primary lymphedema is caused by the absence of lymph vessels or having abnormal lymph vessels at birth.  Secondary lymphedema occurs when lymph vessels are blocked or damaged. Secondary lymphedema is more common. Common causes of lymph vessel blockage include: ? Skin infection, such as cellulitis. ? Infection by parasites (filariasis). ? Injury. ? Radiation therapy. ? Cancer. ? Formation of scar tissue. ? Surgery. What are the signs or  symptoms? Symptoms of this condition include:  Swelling of the arm or leg.  A heavy or tight feeling in the arm or leg.  Swelling of the feet, toes, or fingers. Shoes or rings may fit more tightly than before.  Redness of the skin over the affected area.  Limited movement of the affected limb.  Sensitivity to touch or discomfort in the affected limb. How is this diagnosed? This condition may be diagnosed based on:  Your symptoms and medical history.  A physical exam.  Bioimpedance spectroscopy. In this test, painless electrical currents are used to measure fluid levels in your body.  Imaging tests, such as: ? MRI. ? CT scan. ? Duplex ultrasound. This test uses sound waves to produce images of the vessels and the blood flow on a screen. ? Lymphoscintigraphy. In this test, a low dose of a radioactive substance is injected to trace the flow of lymph through your lymph vessels. ? Lymphangiography. In this test, a contrast dye is injected into the lymph vessel to help show blockages. How is this treated? If an underlying condition is causing the lymphedema, that condition will be treated. For example, antibiotic medicines may be used to treat an infection. Treatment for this condition will depend on the cause of your lymphedema. Treatment may include:  Complete decongestive therapy (CDT). This is done by a certified lymphedema therapist to reduce fluid congestion. This therapy includes: ? Skin care. ? Compression wrapping of the affected area. ? Manual lymph drainage. This is a  special massage technique that promotes lymph drainage out of a limb. ? Specific exercises. Certain exercises can help fluid move out of the affected limb.  Compression. Various methods may be used to apply pressure to the affected limb to reduce the swelling. They include: ? Wearing compression stockings or sleeves on the affected limb. ? Wrapping the affected limb with special bandages.  Surgery. This  is usually done for severe cases only. For example, surgery may be done if you have trouble moving the limb or if the swelling does not get better with other treatments.   Follow these instructions at home: Self-care  The affected area is more likely to become injured or infected. Take these steps to help prevent infection: ? Keep the affected area clean and dry. ? Use approved creams or lotions to keep the skin moisturized. ? Protect your skin from cuts:  Use gloves while cooking or gardening.  Do not walk barefoot.  If you shave the affected area, use an Copy.  Do not wear tight clothes, shoes, or jewelry.  Eat a healthy diet that includes a lot of fruits and vegetables. Activity  Do exercises as told by your health care provider.  Do not sit with your legs crossed.  When possible, keep the affected limb raised (elevated) above the level of your heart.  Avoid carrying things with an arm that is affected by lymphedema. General instructions  Wear compression stockings or sleeves as told by your health care provider.  Note any changes in size of the affected limb. You may be instructed to take regular measurements and keep track of them.  Take over-the-counter and prescription medicines only as told by your health care provider.  If you were prescribed an antibiotic medicine, take or apply it as told by your health care provider. Do not stop using the antibiotic even if you start to feel better or if your condition improves.  Do not use heating pads or ice packs on the affected area.  Avoid having blood draws, IV insertions, or blood pressure checks on the affected limb.  Keep all follow-up visits. This is important. Contact a health care provider if you:  Continue to have swelling in your limb.  Have fluid leaking from the skin of your swollen limb.  Have a cut that does not heal.  Have redness or pain in the affected area.  Develop purplish spots, rash,  blisters, or sores (lesions) on your affected limb. Get help right away if you:  Have new swelling in your limb that starts suddenly.  Have shortness of breath or chest pain.  Have a fever or chills. These symptoms may represent a serious problem that is an emergency. Do not wait to see if the symptoms will go away. Get medical help right away. Call your local emergency services (911 in the [place] Do not drive yourself to the hospital. Summary  Lymphedema is swelling that is caused by the abnormal collection of lymph in the tissues under the skin.  Lymph is fluid from the tissues in your body that is removed through the lymphatic system. This system collects and carries excess fluid, fats, proteins, and wastes from the tissues of the body to the bloodstream.  Lymphedema causes swelling, pain, and redness in the affected area. This may include the trunk area, or an arm or leg.  Treatment for this condition may depend on the cause of your lymphedema. Treatment may include treating the underlying cause, complete decongestive therapy (CDT), compression  methods, or surgery. This information is not intended to replace advice given to you by your health care provider. Make sure you discuss any questions you have with your health care provider. Document Revised: 09/15/2020 Document Reviewed: 09/15/2020 Elsevier Patient Education  2021 [place].  Chronic Kidney Disease, Adult Chronic kidney disease (CKD) occurs when the kidneys are slowly and permanently damaged over a long period of time. The kidneys are a pair of organs that do many important jobs in the body, including:  Removing waste and extra fluid from the blood to make urine.  Making hormones that maintain the amount of fluid in tissues and blood vessels.  Maintaining the right amount of fluids and chemicals in the body. A small amount of kidney damage may not cause problems, but a large amount of damage may make it hard or  impossible for the kidneys to work right. Steps must be taken to slow kidney damage or to stop it from getting worse. If steps are not taken, the kidneys may stop working permanently (end-stage renal disease, or ESRD). Most of the time, CKD does not go away, but it can often be controlled. People who have CKD are usually able to live full lives. What are the causes? The most common causes of this condition are diabetes and high blood pressure (hypertension). Other causes include:  Cardiovascular diseases. These affect the heart and blood vessels.  Kidney diseases. These include: ? Glomerulonephritis, or inflammation of the tiny filters in the kidneys. ? Interstitial nephritis. This is swelling of the small tubes of the kidneys and of the surrounding structures. ? Polycystic kidney disease, in which clusters of fluid-filled sacs form within the kidneys. ? Renal vascular disease. This includes disorders that affect the arteries and veins of the kidneys.  Diseases that affect the body's defense system (immune system).  A problem with urine flow. This may be caused by: ? Kidney stones. ? Cancer. ? An enlarged prostate, in males.  A kidney infection or urinary tract infection (UTI) that keeps coming back.  Vasculitis. This is swelling or inflammation of the blood vessels. What increases the risk? Your chances of having kidney disease increase with age. The following factors may make you more likely to develop this condition:  A family history of kidney disease or kidney failure. Kidney failure means the kidneys can no longer work right.  Certain genetic diseases.  Taking medicines often that are damaging to the kidneys.  Being around or being in contact with toxic substances.  Obesity.  A history of tobacco use. What are the signs or symptoms? Symptoms of this condition include:  Feeling very tired (lethargic) and having less energy.  Swelling, or edema, of the face, legs,  ankles, or feet.  Nausea or vomiting, or loss of appetite.  Confusion or trouble concentrating.  Muscle twitches and cramps, especially in the legs.  Dry, itchy skin.  A metallic taste in the mouth.  Producing less urine, or producing more urine (especially at night).  Shortness of breath.  Trouble sleeping. CKD may also result in not having enough red blood cells or hemoglobin in the blood (anemia) or having weak bones (bone disease). Symptoms develop slowly and may not be obvious until the kidney damage becomes severe. It is possible to have kidney disease for years without having symptoms. How is this diagnosed? This condition may be diagnosed based on:  Blood tests.  Urine tests.  Imaging tests, such as an ultrasound or a CT scan.  A kidney  biopsy. This involves removing a sample of kidney tissue to be looked at under a microscope. Results from these tests will help to determine how serious the CKD is. How is this treated? There is no cure for most cases of this condition, but treatment usually relieves symptoms and prevents or slows the worsening of the disease. Treatment may include:  Diet changes, which may require you to avoid alcohol and foods that are high in salt, potassium, phosphorous, and protein.  Medicines. These may: ? Lower blood pressure. ? Control blood sugar (glucose). ? Relieve anemia. ? Relieve swelling. ? Protect your bones. ? Improve the balance of salts and minerals in your blood (electrolytes).  Dialysis, which is a type of treatment that removes toxic waste from the body. It may be needed if you have kidney failure.  Managing any other conditions that are causing your CKD or making it worse. Follow these instructions at home: Medicines  Take over-the-counter and prescription medicines only as told by your health care provider. The amount of some medicines that you take may need to be changed.  Do not take any new medicines unless approved  by your health care provider. Many medicines can make kidney damage worse.  Do not take any vitamin and mineral supplements unless approved by your health care provider. Many nutritional supplements can make kidney damage worse. Lifestyle  Do not use any products that contain nicotine or tobacco, such as cigarettes, e-cigarettes, and chewing tobacco. If you need help quitting, ask your health care provider.  If you drink alcohol: ? Limit how much you use to:  0-1 drink a day for women who are not pregnant.  0-2 drinks a day for men. ? Know how much alcohol is in your drink. In the [place] one drink equals one 12 oz bottle of beer (355 mL), one 5 oz glass of wine (148 mL), or one 1 oz glass of hard liquor (44 mL).  Maintain a healthy weight. If you need help, ask your health care provider.   General instructions  Follow instructions from your health care provider about eating or drinking restrictions, including any prescribed diet.  Track your blood pressure at home. Report changes in your blood pressure as told.  If you are being treated for diabetes, track your blood glucose levels as told.  Start or continue an exercise plan. Exercise at least 30 minutes a day, 5 days a week.  Keep your immunizations up to date as told.  Keep all follow-up visits. This is important.   Where to find more information  American Association of Kidney Patients: BombTimer.gl  National Kidney Foundation: www.kidney.[place]: https://mathis.com/  Life Options: www.lifeoptions.org  [employer]: www.kidneyschool.org Contact a health care provider if:  Your symptoms get worse.  You develop new symptoms. Get help right away if:  You develop symptoms of ESRD. These include: ? Headaches. ? Numbness in your hands or feet. ? Easy bruising. ? Frequent hiccups. ? Chest pain. ? Shortness of breath. ? Lack of menstrual periods, in women.  You have a fever.  You are producing less  urine than usual.  You have pain or bleeding when you urinate or when you have a bowel movement. These symptoms may represent a serious problem that is an emergency. Do not wait to see if the symptoms will go away. Get medical help right away. Call your local emergency services (911 in the [place] Do not drive yourself to the hospital. Summary  Chronic kidney  disease (CKD) occurs when the kidneys become damaged slowly over a long period of time.  The most common causes of this condition are diabetes and high blood pressure (hypertension).  There is no cure for most cases of CKD, but treatment usually relieves symptoms and prevents or slows the worsening of the disease. Treatment may include a combination of lifestyle changes, medicines, and dialysis. This information is not intended to replace advice given to you by your health care provider. Make sure you discuss any questions you have with your health care provider. Document Revised: 02/25/2020 Document Reviewed: 02/25/2020 Elsevier Patient Education  [place].

## 2021-05-03 NOTE — Addendum Note (Signed)
Addended by: Orland Mustard on: 05/03/2021 06:23 PM   Modules accepted: Orders

## 2021-05-03 NOTE — Progress Notes (Addendum)
Chief Complaint  Patient presents with  . Hospitalization Follow-up   F/u caretaker here luanne today and called daughter Leitha Schuller on the phone [phone]   [facility] visit [facility] f/u due to worsening sob, leg edema, fatigue and weakness cxr with hypoinflation/atelectasis with h/o bronchiectasis and copd chronic cough and wheezing doing duoneb 3x per day. Flutter valve and trelegy having worsening chest congestion and leg swelling. In the ED O2 was 91% placed on 2L oxygen. For leg swelling on lasix 80 mg qd  She is not using flutter valve she knows of   O2 today at rest is 90% she keeps falling asleep today in clinic with ambulation O2 87% after 2 minutes could not continue due to being tired and after 3 min with rest back to 92%  ckd 3a/B needs to f/u with renal creatinine 1.24 gfr 41 04/26/21 taking lasix 80 mg qd   Leg edema chronic advised pt to f/u with vascular appt sch 05/09/21 chronic lymphedema and h/o b/l blood clots    Review of Systems  Constitutional: Positive for malaise/fatigue. Negative for weight loss.  HENT: Negative for hearing loss.   Eyes: Negative for blurred vision.  Respiratory: Positive for shortness of breath and wheezing.   Cardiovascular: Positive for leg swelling.  Musculoskeletal: Negative for falls and joint pain.  Skin: Negative for rash.  Psychiatric/Behavioral: Negative for depression.       Increased sleepiness     Past Medical History:  Diagnosis Date  . Acoustic neuroma ([place])   . Allergy   . Asthma   . Bilateral swelling of feet    and legs  . Bladder infection   . CAD (coronary artery disease)   . Cataract   . Change in voice   . Compression fracture of body of thoracic vertebra (HCC)    T12 09/18/15 MRI s/p fall   . Constipation   . COPD (chronic obstructive pulmonary disease) ([place])    previous CXR with chronic interstitial lung dz   . CVA (cerebral vascular accident) ([place])   . Depression   . Diabetes ([place])    with neuropathy  .  Diabetes mellitus, type 2 ([place])   . Diarrhea   . Double vision   . DVT (deep venous thrombosis) ([place])    right leg 10/2015 was on coumadin off as of 2017/2018 ; s/p IVC filter  . Enuresis   . Eye pain, right   . Fall   . Fatty liver    09/15/15 also mildly dilated pancreatitic duct rec MRCP small sub cm cyst hemangioma speeln mild right hydronephrorossi and prox. hydroureter, kidney stones, mild scarring kidneys  . Female stress incontinence   . Flank pain   . GERD (gastroesophageal reflux disease)    with small hiatal hernia   . Hard of hearing   . Heart disease   . History of kidney problems   . Hyperlipidemia    mixed  . Hypertension   . Hypothyroidism, postsurgical   . Impaired mobility and ADLs    uses rolling walker has caretaker 24/7 at home  . Leg edema   . Mixed incontinence urge and stress (female)(female)   . Neuropathy   . Osteoarthritis    DDD spine   . Osteoporosis with fracture    T12 compression fracture  . Photophobia   . Pulmonary embolism ([place])    10/2015 off coumadin as of 04/2016  . Pulmonary HTN (HCC)    mild pulm HTN, echo 10/09/15 EF 55-60%grade  1 dd, RV systolic pressure increased   . Recurrent UTI   . Sinus pressure   . Skin cancer    BCC jawline and scalp   . Thyroid disease    follows [facility]  . TIA (transient ischemic attack)    MRI 2009/2010 neg stroke   . Trigeminal neuralgia    Dr. Tomi Bamberger s/p gamma knife x 2, on Tegretol since 2011/2012 no increase in dose >200 mg bid rec per family per neurology   . Urinary frequency   . Urinary, incontinence, stress female    Dr Erlene Quan urology    Past Surgical History:  Procedure Laterality Date  . APPENDECTOMY     as a child, open  . BRAIN SURGERY     schwnnoma removal 1996   . brain tumor surgery    . BREAST SURGERY     breast bx  . CATARACT EXTRACTION    . CHOLECYSTECTOMY    . EYE SURGERY     cataract  . IVC FILTER PLACEMENT ([facility] HX)     Dr. Lucky Cowboy 10/2015   . LAPAROSCOPIC TUBAL  LIGATION    . MOHS SURGERY     scalp 04/2014   . PERIPHERAL VASCULAR CATHETERIZATION N/A 10/11/2015   Procedure: IVC Filter Insertion;  Surgeon: Algernon Huxley, MD;  Location: [facility];  Service: Cardiovascular;  Laterality: N/A;  . PERIPHERAL VASCULAR THROMBECTOMY Bilateral 03/29/2018   Procedure: PERIPHERAL VASCULAR THROMBECTOMY;  Surgeon: Algernon Huxley, MD;  Location: [facility];  Service: Cardiovascular;  Laterality: Bilateral;  . PUBOVAGINAL SLING    . THROAT SURGERY    . THYROID SURGERY     tumor around vocal cords   . TOOTH EXTRACTION     winter 2018   . TOTAL THYROIDECTOMY  1976   Family History  Problem Relation Age of Onset  . Heart disease Mother   . Diabetes Father   . Cancer Daughter        breast ca x 2 s/p mastectomy    Social History   Socioeconomic History  . Marital status: Widowed    Spouse name: Not on file  . Number of children: 5  . Years of education: Not on file  . Highest education level: Not on file  Occupational History  . Occupation: retired  Tobacco Use  . Smoking status: Former Smoker    Packs/day: 0.50    Years: 20.00    Pack years: 10.00    Types: Cigarettes    Quit date: 09/20/1995    Years since quitting: 25.6  . Smokeless tobacco: Never Used  . Tobacco comment: quit 1996 smoked 20 years max 8 cig qd   Vaping Use  . Vaping Use: Never used  Substance and Sexual Activity  . Alcohol use: No  . Drug use: No  . Sexual activity: Not on file  Other Topics Concern  . Not on file  Social History Narrative   Lives at home with caretaker currently [name]    Has children    Likes to ride stationary bike and sew             Social Determinants of Health   Financial Resource Strain: Low Risk   . Difficulty of Paying Living Expenses: Not hard at all  Food Insecurity: No Food Insecurity  . Worried About Charity fundraiser in the Last Year: Never true  . Ran Out of Food in the Last Year: Never true  Transportation  Needs: No Transportation Needs  . Lack of Transportation (Medical): No  . Lack of Transportation (Non-Medical): No  Physical Activity: Insufficiently Active  . Days of Exercise per Week: 7 days  . Minutes of Exercise per Session: 20 min  Stress: No Stress Concern Present  . Feeling of Stress : Not at all  Social Connections: Not on file  Intimate Partner Violence: Not on file   Current Meds  Medication Sig  . acetaminophen (TYLENOL) 325 MG tablet Take 2 tablets (650 mg total) by mouth every 6 (six) hours as needed.  Marland Kitchen acetaminophen-codeine (TYLENOL #3) 300-30 MG tablet Take 1 tablet by mouth 2 (two) times daily as needed for moderate pain.  Marland Kitchen apixaban (ELIQUIS) 2.5 MG TABS tablet Take 1 tablet (2.5 mg total) by mouth 2 (two) times daily.  Marland Kitchen atorvastatin (LIPITOR) 40 MG tablet Take 1 tablet (40 mg total) by mouth daily at 6 PM. Generic ok  . Azelaic Acid 15 % gel Apply 1 application topically 2 (two) times daily. After skin is thoroughly washed and patted dry, gently but thoroughly massage a thin film of azelaic acid cream into the affected area twice daily, in the morning and evening.  . Calcium Carbonate-Vitamin D (CALCIUM 600+D) 600-400 MG-UNIT tablet Take 1 tablet by mouth 2 (two) times daily. Lunch and dinner  . cetirizine (ZYRTEC) 10 MG tablet Take 1 tablet (10 mg total) by mouth daily.  . clotrimazole-betamethasone (LOTRISONE) lotion SMARTSIG:In Ear(s)  . Fluticasone-Umeclidin-Vilant (TRELEGY ELLIPTA) 100-62.5-25 MCG/INH AEPB Inhale 1 puff into the lungs daily. Rinse mouth d/c symbicort and spiriva  . furosemide (LASIX) 40 MG tablet Take 2 tablets (80 mg total) by mouth daily. May take 2nd dose 40 mg if needed at lunch for 5 days as needed  . gabapentin (NEURONTIN) 400 MG capsule Take 1 capsule (400 mg total) by mouth 3 (three) times daily.  . hydrocortisone 2.5 % cream Apply topically 2 (two) times daily.  Marland Kitchen ipratropium-albuterol (DUONEB) 0.5-2.5 (3) MG/3ML SOLN Inhale 3 mLs into the  lungs every 4 (four) hours as needed.  Marland Kitchen levothyroxine (SYNTHROID) 200 MCG tablet Take 1 tablet (200 mcg total) by mouth daily before breakfast. Except on Sunday. Do not take with other medications or vitamins  . Melatonin 10 MG TABS Take 10 mg by mouth at bedtime.  . mirtazapine (REMERON) 15 MG tablet Take 1 tablet (15 mg total) by mouth at bedtime.  . Multiple Vitamin (MULTIVITAMIN WITH MINERALS) TABS tablet Take 1 tablet by mouth daily.   . mupirocin ointment (BACTROBAN) 2 % Apply 1 application topically 3 (three) times daily. Right thumb  . pantoprazole (PROTONIX) 40 MG tablet Take 1 tablet (40 mg total) by mouth daily. 30 minutes before lunch or dinner  . polyethylene glycol powder (GLYCOLAX/MIRALAX) 17 GM/SCOOP powder Take 17 g by mouth daily.  . polyvinyl alcohol (LIQUIFILM TEARS) 1.4 % ophthalmic solution Place 1 drop into both eyes as needed for dry eyes.  . [DISCONTINUED] JANUVIA 25 MG tablet Take 25 mg by mouth daily.   Allergies  Allergen Reactions  . Penicillins Shortness Of Breath, Rash and Other (See Comments)    Has patient had a PCN reaction causing immediate rash, facial/tongue/throat swelling, SOB or lightheadedness with hypotension: Yes Has patient had a PCN reaction causing severe rash involving mucus membranes or skin necrosis: No Has patient had a PCN reaction that required hospitalization No Has patient had a PCN reaction occurring within the last 10 years: No If all of the above answers are "  NO", then may proceed with Cephalosporin use.  . Sulfa Antibiotics Shortness Of Breath, Rash and Other (See Comments)  . Amitiza [Lubiprostone]     N/v/d  . Aspirin Other (See Comments)    Reaction:  Unknown  Other reaction(s): Bleeding (intolerance) Per patient " causes nose to bleed" Can take 81 mg daily without any complications Other reaction(s): "bloody nose"   . Penicillin G Other (See Comments)    Has patient had a PCN reaction causing immediate rash,  facial/tongue/throat swelling, SOB or lightheadedness with hypotension: No Has patient had a PCN reaction causing severe rash involving mucus membranes or skin necrosis: Unknown Has patient had a PCN reaction that required hospitalization: Unknown Has patient had a PCN reaction occurring within the last 10 years: Unknown If all of the above answers are "NO", then may proceed with Cephalosporin use.   Recent Results (from the past 2160 hour(s))  Comprehensive metabolic panel     Status: Abnormal   Collection Time: 03/29/21  3:47 PM  Result Value Ref Range   Sodium 143 135 - 145 mEq/L   Potassium 4.0 3.5 - 5.1 mEq/L   Chloride 99 96 - 112 mEq/L   CO2 33 (H) 19 - 32 mEq/L   Glucose, Bld 109 (H) 70 - 99 mg/dL   BUN 22 6 - 23 mg/dL   Creatinine, Ser 1.49 (H) 0.40 - 1.20 mg/dL   Total Bilirubin 0.7 0.2 - 1.2 mg/dL   Alkaline Phosphatase 69 39 - 117 U/L   AST 21 0 - 37 U/L   ALT 15 0 - 35 U/L   Total Protein 6.9 6.0 - 8.3 g/dL   Albumin 3.8 3.5 - 5.2 g/dL   GFR 30.22 (L) >60.00 mL/min    Comment: Calculated using the CKD-EPI Creatinine Equation (2021)   Calcium 8.9 8.4 - 10.5 mg/dL  CBC with Differential/Platelet     Status: None   Collection Time: 03/29/21  3:47 PM  Result Value Ref Range   WBC 6.8 4.0 - 10.5 K/uL   RBC 4.71 3.87 - 5.11 Mil/uL   Hemoglobin 14.4 12.0 - 15.0 g/dL   HCT 43.3 36.0 - 46.0 %   MCV 92.0 78.0 - 100.0 fl   MCHC 33.3 30.0 - 36.0 g/dL   RDW 15.0 11.5 - 15.5 %   Platelets 173.0 150.0 - 400.0 K/uL   Neutrophils Relative % 63.5 43.0 - 77.0 %   Lymphocytes Relative 23.7 12.0 - 46.0 %   Monocytes Relative 9.4 3.0 - 12.0 %   Eosinophils Relative 2.8 0.0 - 5.0 %   Basophils Relative 0.6 0.0 - 3.0 %   Neutro Abs 4.3 1.4 - 7.7 K/uL   Lymphs Abs 1.6 0.7 - 4.0 K/uL   Monocytes Absolute 0.6 0.1 - 1.0 K/uL   Eosinophils Absolute 0.2 0.0 - 0.7 K/uL   Basophils Absolute 0.0 0.0 - 0.1 K/uL  Hemoglobin A1c     Status: None   Collection Time: 03/29/21  3:47 PM  Result  Value Ref Range   Hgb A1c MFr Bld 6.4 4.6 - 6.5 %    Comment: Glycemic Control Guidelines for People with Diabetes:Non Diabetic:  <6%Goal of Therapy: <7%Additional Action Suggested:  >8%   TSH     Status: None   Collection Time: 03/29/21  3:47 PM  Result Value Ref Range   TSH 1.33 0.35 - 4.50 uIU/mL  Lipid panel     Status: None   Collection Time: 03/29/21  3:47 PM  Result Value Ref  Range   Cholesterol 133 0 - 200 mg/dL    Comment: ATP III Classification       Desirable:  < 200 mg/dL               Borderline High:  200 - 239 mg/dL          High:  > = 240 mg/dL   Triglycerides 115.0 0.0 - 149.0 mg/dL    Comment: Normal:  <150 mg/dLBorderline High:  150 - 199 mg/dL   HDL 48.30 >39.00 mg/dL   VLDL 23.0 0.0 - 40.0 mg/dL   LDL Cholesterol 62 0 - 99 mg/dL   Total CHOL/HDL Ratio 3     Comment:                Men          Women1/2 Average Risk     3.4          3.3Average Risk          5.0          4.42X Average Risk          9.6          7.13X Average Risk          15.0          11.0                       NonHDL 84.82     Comment: NOTE:  Non-HDL goal should be 30 mg/dL higher than patient's LDL goal (i.e. LDL goal of < 70 mg/dL, would have non-HDL goal of < 100 mg/dL)  CBC with Differential     Status: None   Collection Time: 04/26/21  6:48 PM  Result Value Ref Range   WBC 7.4 4.0 - 10.5 K/uL   RBC 4.59 3.87 - 5.11 MIL/uL   Hemoglobin 13.8 12.0 - 15.0 g/dL   HCT 43.1 36.0 - 46.0 %   MCV 93.9 80.0 - 100.0 fL   MCH 30.1 26.0 - 34.0 pg   MCHC 32.0 30.0 - 36.0 g/dL   RDW 14.5 11.5 - 15.5 %   Platelets 184 150 - 400 K/uL   nRBC 0.0 0.0 - 0.2 %   Neutrophils Relative % 61 %   Neutro Abs 4.5 1.7 - 7.7 K/uL   Lymphocytes Relative 25 %   Lymphs Abs 1.8 0.7 - 4.0 K/uL   Monocytes Relative 10 %   Monocytes Absolute 0.7 0.1 - 1.0 K/uL   Eosinophils Relative 3 %   Eosinophils Absolute 0.2 0.0 - 0.5 K/uL   Basophils Relative 1 %   Basophils Absolute 0.0 0.0 - 0.1 K/uL   Immature Granulocytes 0  %   Abs Immature Granulocytes 0.03 0.00 - 0.07 K/uL    Comment: Performed at [facility], [address] [place], [place] [address]  Comprehensive metabolic panel     Status: Abnormal   Collection Time: 04/26/21  6:48 PM  Result Value Ref Range   Sodium 140 135 - 145 mmol/L   Potassium 3.5 3.5 - 5.1 mmol/L   Chloride 98 98 - 111 mmol/L   CO2 31 22 - 32 mmol/L   Glucose, Bld 99 70 - 99 mg/dL    Comment: Glucose reference range applies only to samples taken after fasting for at least 8 hours.   BUN 19 8 - 23 mg/dL   Creatinine, Ser 1.24 (H) 0.44 - 1.00 mg/dL   Calcium  8.5 (L) 8.9 - 10.3 mg/dL   Total Protein 7.1 6.5 - 8.1 g/dL   Albumin 3.7 3.5 - 5.0 g/dL   AST 25 15 - 41 U/L   ALT 18 0 - 44 U/L   Alkaline Phosphatase 58 38 - 126 U/L   Total Bilirubin 0.9 0.3 - 1.2 mg/dL   GFR, Estimated 41 (L) >60 mL/min    Comment: (NOTE) Calculated using the CKD-EPI Creatinine Equation (2021)    Anion gap 11 5 - 15    Comment: Performed at [facility], [address] [place], [place] [address]  Brain natriuretic peptide     Status: None   Collection Time: 04/26/21  6:48 PM  Result Value Ref Range   B Natriuretic Peptide 69.6 0.0 - 100.0 pg/mL    Comment: Performed at [facility], [place], [place] [address]  Troponin I (High Sensitivity)     Status: None   Collection Time: 04/26/21  6:48 PM  Result Value Ref Range   Troponin I (High Sensitivity) 8 <18 ng/L    Comment: (NOTE) Elevated high sensitivity troponin I (hsTnI) values and significant  changes across serial measurements may suggest ACS but many other  chronic and acute conditions are known to elevate hsTnI results.  Refer to the "Links" section for chest pain algorithms and additional  guidance. Performed at [facility], [place], [place] [address]   Resp Panel by RT-PCR (Flu A&B, Covid) Nasopharyngeal Swab     Status: None   Collection Time: 04/26/21  10:38 PM   Specimen: Nasopharyngeal Swab; Nasopharyngeal(NP) swabs in vial transport medium  Result Value Ref Range   SARS Coronavirus 2 by RT PCR NEGATIVE NEGATIVE    Comment: (NOTE) SARS-CoV-2 target nucleic acids are NOT DETECTED.  The SARS-CoV-2 RNA is generally detectable in upper respiratory specimens during the acute phase of infection. The lowest concentration of SARS-CoV-2 viral copies this assay can detect is 138 copies/mL. A negative result does not preclude SARS-Cov-2 infection and should not be used as the sole basis for treatment or other patient management decisions. A negative result may occur with  improper specimen collection/handling, submission of specimen other than nasopharyngeal swab, presence of viral mutation(s) within the areas targeted by this assay, and inadequate number of viral copies(<138 copies/mL). A negative result must be combined with clinical observations, patient history, and epidemiological information. The expected result is Negative.  Fact Sheet for Patients:  EntrepreneurPulse.com.au  Fact Sheet for Healthcare Providers:  IncredibleEmployment.be  This test is no t yet approved or cleared by the [place] FDA and  has been authorized for detection and/or diagnosis of SARS-CoV-2 by FDA under an Emergency Use Authorization (EUA). This EUA will remain  in effect (meaning this test can be used) for the duration of the COVID-19 declaration under Section 564(b)(1) of the Act, 21 U.S.C.section 360bbb-3(b)(1), unless the authorization is terminated  or revoked sooner.       Influenza A by PCR NEGATIVE NEGATIVE   Influenza B by PCR NEGATIVE NEGATIVE    Comment: (NOTE) The Xpert Xpress SARS-CoV-2/FLU/RSV plus assay is intended as an aid in the diagnosis of influenza from Nasopharyngeal swab specimens and should not be used as a sole basis for treatment. Nasal washings and aspirates are unacceptable for  Xpert Xpress SARS-CoV-2/FLU/RSV testing.  Fact Sheet for Patients: EntrepreneurPulse.com.au  Fact Sheet for Healthcare Providers: IncredibleEmployment.be  This test is not yet approved or cleared by the [place] FDA  and has been authorized for detection and/or diagnosis of SARS-CoV-2 by FDA under an Emergency Use Authorization (EUA). This EUA will remain in effect (meaning this test can be used) for the duration of the COVID-19 declaration under Section 564(b)(1) of the Act, 21 U.S.C. section 360bbb-3(b)(1), unless the authorization is terminated or revoked.  Performed at [facility], [address] [place], [place] [address]    Objective  Body mass index is 31.26 kg/m. Wt Readings from Last 3 Encounters:  05/03/21 199 lb 9.6 oz (90.5 kg)  04/26/21 199 lb 1.2 oz (90.3 kg)  03/29/21 199 lb (90.3 kg)   Temp Readings from Last 3 Encounters:  05/03/21 98.3 F (36.8 C) (Oral)  04/27/21 98 F (36.7 C) (Oral)  03/29/21 98 F (36.7 C) (Oral)   BP Readings from Last 3 Encounters:  05/03/21 110/70  04/27/21 (!) 132/57  03/29/21 110/74   Pulse Readings from Last 3 Encounters:  04/27/21 (!) 54  03/29/21 69  11/11/20 71    Physical Exam Vitals and nursing note reviewed.  Constitutional:      Appearance: Normal appearance. She is well-developed and well-groomed.  HENT:     Head: Normocephalic and atraumatic.  Cardiovascular:     Rate and Rhythm: Normal rate and regular rhythm.     Heart sounds: Normal heart sounds. No murmur heard.   Pulmonary:     Effort: Pulmonary effort is normal.     Breath sounds: Normal breath sounds.  Skin:    General: Skin is warm and dry.  Neurological:     General: No focal deficit present.     Mental Status: She is alert and oriented to person, place, and time. Mental status is at baseline.     Gait: Gait abnormal.     Comments: Uses rollator   Psychiatric:        Attention and  Perception: Attention and perception normal.        Mood and Affect: Mood and affect normal.        Speech: Speech normal.        Behavior: Behavior normal. Behavior is cooperative.        Thought Content: Thought content normal.        Cognition and Memory: Cognition and memory normal.        Judgment: Judgment normal.     Assessment  Plan  Bronchiectasis with acute exacerbation (HCC) with hypoxia with h/o COPD h/o PAH - Plan: CT Chest Wo Contrast Rest O2 90% with ambulation  Will do echo as well worsening sob Rx flutter valve  Use duoneb tid to qid daily  She may need to f/u with pulm   Ive ordered chest cat scan and echo   Use flutter valve use daily   Please follow up with vascular Dr. Delana Meyer and the kidney doctors Keep lasix 80 in the am until you follow up with the kidney doctors  Use breathing treatments up to 4x per day and go to the pharmacy for further instruction of use   Miralax + colace as needed for constipation  Warm prune unsweetened    Can Stop Januvia 25 mg due to cost   Pulse oximeter $10 [employer]   Altered mental status/fatigue, increased sleep likely due to hypoxia  Chronic acquired lymphedema F/u vascular   Stage 3b chronic kidney disease ([place]) F/u renal for now will continue lasix 80 mg qd   Provider: Dr. Olivia Mackie McLean-Scocuzza-Internal Medicine

## 2021-05-04 ENCOUNTER — Ambulatory Visit
Admission: RE | Admit: 2021-05-04 | Discharge: 2021-05-04 | Disposition: A | Discharge status: Home / Self Care | Payer: Medicare PPO | Source: Ambulatory Visit | Attending: Internal Medicine | Admitting: Internal Medicine

## 2021-05-04 DIAGNOSIS — R0602 Shortness of breath: Secondary | ICD-10-CM | POA: Diagnosis not present

## 2021-05-04 DIAGNOSIS — R0989 Other specified symptoms and signs involving the circulatory and respiratory systems: Secondary | ICD-10-CM | POA: Diagnosis not present

## 2021-05-04 DIAGNOSIS — J471 Bronchiectasis with (acute) exacerbation: Secondary | ICD-10-CM | POA: Diagnosis not present

## 2021-05-05 ENCOUNTER — Encounter: Payer: Self-pay | Admitting: Pulmonary Disease

## 2021-05-05 ENCOUNTER — Ambulatory Visit (INDEPENDENT_AMBULATORY_CARE_PROVIDER_SITE_OTHER): Payer: Medicare PPO | Admitting: Pulmonary Disease

## 2021-05-05 ENCOUNTER — Telehealth: Payer: Self-pay | Admitting: Pulmonary Disease

## 2021-05-05 ENCOUNTER — Other Ambulatory Visit: Payer: Self-pay

## 2021-05-05 ENCOUNTER — Telehealth: Payer: Self-pay | Admitting: Internal Medicine

## 2021-05-05 ENCOUNTER — Encounter (INDEPENDENT_AMBULATORY_CARE_PROVIDER_SITE_OTHER): Payer: Self-pay

## 2021-05-05 VITALS — BP 124/78 | HR 70 | Temp 97.2°F | Ht 67.0 in | Wt 200.8 lb

## 2021-05-05 DIAGNOSIS — J9611 Chronic respiratory failure with hypoxia: Secondary | ICD-10-CM | POA: Diagnosis not present

## 2021-05-05 DIAGNOSIS — J449 Chronic obstructive pulmonary disease, unspecified: Secondary | ICD-10-CM | POA: Diagnosis not present

## 2021-05-05 DIAGNOSIS — J479 Bronchiectasis, uncomplicated: Secondary | ICD-10-CM | POA: Diagnosis not present

## 2021-05-05 MED ORDER — FUROSEMIDE 40 MG PO TABS: 40.0000 mg | ORAL_TABLET | Freq: Every day | ORAL | 0 refills | Status: DC

## 2021-05-05 MED ORDER — AZITHROMYCIN 500 MG PO TABS: 500.0000 mg | ORAL_TABLET | Freq: Every day | ORAL | 0 refills | Status: DC

## 2021-05-05 MED ORDER — ALBUTEROL SULFATE (2.5 MG/3ML) 0.083% IN NEBU: 2.5000 mg | INHALATION_SOLUTION | Freq: Four times a day (QID) | RESPIRATORY_TRACT | 5 refills | Status: DC | PRN

## 2021-05-05 MED ORDER — GUAIFENESIN ER 600 MG PO TB12: 1200.0000 mg | ORAL_TABLET | Freq: Two times a day (BID) | ORAL | Status: AC | PRN

## 2021-05-05 NOTE — Progress Notes (Signed)
[facility]  Chief Complaint  Patient presents with  . Consult    Constitutional:  BP 124/78 (BP Location: Left Arm, Patient Position: Sitting, Cuff Size: Normal)   Pulse 70   Temp (!) 97.2 F (36.2 C) (Temporal)   Ht 5\' 7"  (1.702 m)   Wt 200 lb 12.8 oz (91.1 kg)   LMP  (LMP Unknown)   SpO2 91%   BMI 31.45 kg/m   Past Medical History:  Acoustic neuroma, Allergies, CAD, Cataract, CVA, Depression, DM, Neuropathy, DVT s/p IVC filter, Fatty liver, GERD, Hiatal hernia, HLD, HTN, Hypothyroidism, OA, Osteoporosis, PE, Recurrent UTI, Trigeminal neuralgia  Past Surgical History:  She  has a past surgical history that includes brain tumor surgery; Throat surgery; Cholecystectomy; Cardiac catheterization (N/A, 10/11/2015); Thyroid surgery; IVC FILTER PLACEMENT ([facility]); Brain surgery; Tooth extraction; Eye surgery; Breast surgery; Pubovaginal sling; Mohs surgery; Appendectomy; Laparoscopic tubal ligation; Total thyroidectomy (1976); Cataract extraction; and PERIPHERAL VASCULAR THROMBECTOMY (Bilateral, 03/29/2018).  Brief Summary:  ALMADELIA Erickson is a 87 y.o. female former smoker with bronchiectasis.      Subjective:   She is here with an aide from group home.  She has very poor hearing.  Interview conducted by me typing into Microsoft word in large font, and she would read this and then speak back to me.  She was seen previously by Dr. Ashby Dawes for bronchiectasis.  She was in the emergency room in May 2022 for exacerbation.  She was found to have leg swelling with low oxygen.  She has persistent cough with clear to yellow sputum.  Not having fever, or hemoptysis.  Always has chest congest and sometimes gets wheezing.  She has trelegy and this helps.  She hasn't been using nebulizer or flutter valve much.  She hasn't been using mucinex.    She was found to have SpO2 in mid 80's at recent visit with PCP and has order for 2 liters home oxygen  set up.  She was prescribed increase dose of lasix.  This hasn't made any difference with her leg swelling, but caused cramping.  Labs from 04/26/21: WBC 7.4, Hb 13.8, PLT 184, Na 140, K 3.5, CO2 31, Creatinine 1.24, Ca 8.5, BNP 69.6, COVID/Flu negative.  Physical Exam:   Appearance - well kempt   ENMT - no sinus tenderness, no oral exudate, no LAN, Mallampati 2 airway, no stridor  Respiratory - b/l rhonchi and rales that clear with coughing  CV - s1s2 regular rate and rhythm, no murmurs  Ext - no clubbing, no edema  Skin - no rashes  Psych - normal mood and affect   Pulmonary testing:    Chest Imaging:   CT chest 05/04/21 >> scarring with BTX RUL, ATX in lingula, bronchial wall thickening  Sleep Tests:    Cardiac Tests:   Echo 10/09/15 >> EF 55 to 60%, grade 1 DD  Social History:  She  reports that she quit smoking about 25 years ago. Her smoking use included cigarettes. She has a 10.00 pack-year smoking history. She has never used smokeless tobacco. She reports that she does not drink alcohol and does not use drugs.  Family History:  Her family history includes Cancer in her daughter; Diabetes in her father; Heart disease in her mother.    Discussion:  She has regional bronchiectasis likely from prior respiratory infections.  She has prior history of smoking with reported history of COPD.  She was recently found to have low oxygen  level.  This is likely causing right heart strain contributing to leg swelling.  Assessment/Plan:   Bronchiectasis with COPD. - will give her course of zithromax - will continue trelegy; inhaler technique demonstrated - d/c ipratropium since this isn't adding any benefit since she is on LAMA - prn albuterol in nebulizer; advised her to use this before using flutter valve - flutter valve two to three times per day as needed - resume mucinex bid prn - provider her print out of our Microsoft word communication  Chronic respiratory  failure with hypoxia. - she has two liters oxygen set up through PCP  Leg edema. - will have her change last to 40 mg daily  Time Spent Involved in Patient Care on Day of Examination:  48 minutes  Follow up:  Patient Instructions  Zithromax antibiotic 500 mg daily for 7 days  Use flutter valve 2 to 3 times per day to help loosen phlegm  Use mucinex 1200 mg twice per day as needed to help loosen phlegm  Trelegy one puff daily, and rinse your mouth after each use  Stop using ipratropium in nebulizer; this is duplication of medicine you are getting from trelegy  You can use albuterol in nebulizer every 4 to 6 hours as needed; you should use albuterol in nebulizer before using flutter valve  Dr. Aundra Dubin has ordered 2 liters oxygen set up for your already   Follow up in 3 to 4 weeks   Medication List:   Allergies as of 05/05/2021      Reactions   Penicillins Shortness Of Breath, Rash, Other (See Comments)   Has patient had a PCN reaction causing immediate rash, facial/tongue/throat swelling, SOB or lightheadedness with hypotension: Yes Has patient had a PCN reaction causing severe rash involving mucus membranes or skin necrosis: No Has patient had a PCN reaction that required hospitalization No Has patient had a PCN reaction occurring within the last 10 years: No If all of the above answers are "NO", then may proceed with Cephalosporin use.   Sulfa Antibiotics Shortness Of Breath, Rash, Other (See Comments)   Amitiza [lubiprostone]    N/v/d   Aspirin Other (See Comments)   Reaction:  Unknown  Other reaction(s): Bleeding (intolerance) Per patient " causes nose to bleed" Can take 81 mg daily without any complications Other reaction(s): "bloody nose"   Penicillin G Other (See Comments)   Has patient had a PCN reaction causing immediate rash, facial/tongue/throat swelling, SOB or lightheadedness with hypotension: No Has patient had a PCN reaction causing severe rash involving  mucus membranes or skin necrosis: Unknown Has patient had a PCN reaction that required hospitalization: Unknown Has patient had a PCN reaction occurring within the last 10 years: Unknown If all of the above answers are "NO", then may proceed with Cephalosporin use.      Medication List       Accurate as of May 05, 2021  9:53 AM. If you have any questions, ask your nurse or doctor.        STOP taking these medications   fluticasone 50 MCG/ACT nasal spray Commonly known as: FLONASE Stopped by: Chesley Mires, MD   ipratropium-albuterol 0.5-2.5 (3) MG/3ML Soln Commonly known as: DUONEB Stopped by: Chesley Mires, MD     TAKE these medications   acetaminophen 325 MG tablet Commonly known as: TYLENOL Take 2 tablets (650 mg total) by mouth every 6 (six) hours as needed.   acetaminophen-codeine 300-30 MG tablet Commonly known as: TYLENOL #3 Take 1  tablet by mouth 2 (two) times daily as needed for moderate pain.   albuterol (2.5 MG/3ML) 0.083% nebulizer solution Commonly known as: PROVENTIL Take 3 mLs (2.5 mg total) by nebulization every 6 (six) hours as needed for wheezing or shortness of breath. Started by: Chesley Mires, MD   apixaban 2.5 MG Tabs tablet Commonly known as: Eliquis Take 1 tablet (2.5 mg total) by mouth 2 (two) times daily.   atorvastatin 40 MG tablet Commonly known as: LIPITOR Take 1 tablet (40 mg total) by mouth daily at 6 PM. Generic ok   Azelaic Acid 15 % gel Apply 1 application topically 2 (two) times daily. After skin is thoroughly washed and patted dry, gently but thoroughly massage a thin film of azelaic acid cream into the affected area twice daily, in the morning and evening.   azithromycin 500 MG tablet Commonly known as: Zithromax Take 1 tablet (500 mg total) by mouth daily. Started by: Chesley Mires, MD   Calcium Carbonate-Vitamin D 600-400 MG-UNIT tablet Commonly known as: Calcium 600+D Take 1 tablet by mouth 2 (two) times daily. Lunch and dinner    cetirizine 10 MG tablet Commonly known as: ZYRTEC Take 1 tablet (10 mg total) by mouth daily.   clotrimazole-betamethasone lotion Commonly known as: LOTRISONE SMARTSIG:In Ear(s)   furosemide 80 MG tablet Commonly known as: LASIX Take 1 tablet (80 mg total) by mouth daily. May take 2nd dose 40 mg if needed at lunch for 5 days as needed   gabapentin 400 MG capsule Commonly known as: NEURONTIN Take 1 capsule (400 mg total) by mouth 3 (three) times daily.   guaiFENesin 600 MG 12 hr tablet Commonly known as: MUCINEX Take 2 tablets (1,200 mg total) by mouth 2 (two) times daily as needed for cough or to loosen phlegm. What changed:   how much to take  when to take this  reasons to take this Changed by: Chesley Mires, MD   hydrocortisone 2.5 % cream Apply topically 2 (two) times daily.   levothyroxine 200 MCG tablet Commonly known as: SYNTHROID Take 1 tablet (200 mcg total) by mouth daily before breakfast. Except on Sunday. Do not take with other medications or vitamins   Melatonin 10 MG Tabs Take 10 mg by mouth at bedtime.   mirtazapine 15 MG tablet Commonly known as: Remeron Take 1 tablet (15 mg total) by mouth at bedtime.   multivitamin with minerals Tabs tablet Take 1 tablet by mouth daily.   mupirocin ointment 2 % Commonly known as: BACTROBAN Apply 1 application topically 3 (three) times daily. Right thumb   pantoprazole 40 MG tablet Commonly known as: PROTONIX Take 1 tablet (40 mg total) by mouth daily. 30 minutes before lunch or dinner   polyethylene glycol powder 17 GM/SCOOP powder Commonly known as: GLYCOLAX/MIRALAX Take 17 g by mouth daily.   polyvinyl alcohol 1.4 % ophthalmic solution Commonly known as: LIQUIFILM TEARS Place 1 drop into both eyes as needed for dry eyes.   Trelegy Ellipta 100-62.5-25 MCG/INH Aepb Generic drug: Fluticasone-Umeclidin-Vilant Inhale 1 puff into the lungs daily. Rinse mouth d/c symbicort and spiriva       Signature:   Chesley Mires, MD [place] Pager - [phone] 05/05/2021, 9:53 AM

## 2021-05-05 NOTE — Telephone Encounter (Signed)
Please see 05/05/2021 mychart encounter,

## 2021-05-05 NOTE — Telephone Encounter (Signed)
Per Dr. Halford Chessman verbally- okay to send in Rx for lasix 40mg . Further refills should come from PCP.  Rx sent to preferred pharmacy.   ATC patient--no answer with no option to leave vm.

## 2021-05-05 NOTE — Telephone Encounter (Signed)
Patient's caregiver, Kingsley Spittle, [phone]. Wanted to know if oxygen has been ordered and directions when to give to patient has been ordered.

## 2021-05-05 NOTE — Patient Instructions (Signed)
Zithromax antibiotic 500 mg daily for 7 days  Use flutter valve 2 to 3 times per day to help loosen phlegm  Use mucinex 1200 mg twice per day as needed to help loosen phlegm  Trelegy one puff daily, and rinse your mouth after each use  Stop using ipratropium in nebulizer; this is duplication of medicine you are getting from trelegy  You can use albuterol in nebulizer every 4 to 6 hours as needed; you should use albuterol in nebulizer before using flutter valve  Dr. Aundra Dubin has ordered 2 liters oxygen set up for your already   Follow up in 3 to 4 weeks

## 2021-05-06 DIAGNOSIS — J449 Chronic obstructive pulmonary disease, unspecified: Secondary | ICD-10-CM | POA: Diagnosis not present

## 2021-05-06 NOTE — Telephone Encounter (Signed)
Informed that oxygen order has been sent in. Patient has been seen by pulmonology. They will await the delivery of the oxygen.   Informed the oxygen use will be continuous and this can be changed by pulmonology. They will continue to monitor patient's O2 stats at home

## 2021-05-09 ENCOUNTER — Other Ambulatory Visit: Payer: Self-pay

## 2021-05-09 ENCOUNTER — Encounter (INDEPENDENT_AMBULATORY_CARE_PROVIDER_SITE_OTHER): Payer: Self-pay | Admitting: Vascular Surgery

## 2021-05-09 ENCOUNTER — Ambulatory Visit (INDEPENDENT_AMBULATORY_CARE_PROVIDER_SITE_OTHER): Payer: Medicare PPO | Admitting: Vascular Surgery

## 2021-05-09 VITALS — BP 129/68 | HR 65 | Resp 16 | Wt 200.0 lb

## 2021-05-09 DIAGNOSIS — I890 Lymphedema, not elsewhere classified: Secondary | ICD-10-CM

## 2021-05-09 DIAGNOSIS — I2721 Secondary pulmonary arterial hypertension: Secondary | ICD-10-CM | POA: Diagnosis not present

## 2021-05-09 DIAGNOSIS — E1122 Type 2 diabetes mellitus with diabetic chronic kidney disease: Secondary | ICD-10-CM

## 2021-05-09 DIAGNOSIS — J449 Chronic obstructive pulmonary disease, unspecified: Secondary | ICD-10-CM

## 2021-05-09 DIAGNOSIS — K219 Gastro-esophageal reflux disease without esophagitis: Secondary | ICD-10-CM

## 2021-05-09 DIAGNOSIS — N1832 Chronic kidney disease, stage 3b: Secondary | ICD-10-CM | POA: Diagnosis not present

## 2021-05-09 NOTE — Progress Notes (Signed)
MRN : [id number]  Jessica Erickson is a 87 y.o. (06-Mar-1926) female who presents with chief complaint of No chief complaint on file.   History of Present Illness:   Patient is seen for evaluation of leg swelling. The patient first noticed the swelling remotely but is now concerned because of a significant increase in the overall edema. The swelling is associated with pain and discoloration. The patient notes that in the morning the legs are significantly improved but they steadily worsened throughout the course of the day. Elevation makes the legs better, dependency makes them much worse.   There is no history of ulcerations associated with the swelling.   The patient denies any recent changes in their medications.  The patient has not been wearing graduated compression.  The patient has no had any past angiography, interventions or vascular surgery.  The patient has a remote history of DVT.   Venous Duplex dated 11/10/2019 IMPRESSION: No evidence of deep venous thrombosis in either lower extremity. Limited evaluation of the deep calf veins.   There is no history of radiation treatment to the groin or pelvis No history of malignancies. No history of trauma or groin or pelvic surgery. No history of foreign travel or parasitic infections area    No outpatient medications have been marked as taking for the 05/09/21 encounter (Appointment) with Delana Meyer, Dolores Lory, MD.    Past Medical History:  Diagnosis Date  . Acoustic neuroma ([place])   . Allergy   . Asthma   . Bilateral swelling of feet    and legs  . Bladder infection   . CAD (coronary artery disease)   . Cataract   . Change in voice   . Compression fracture of body of thoracic vertebra (HCC)    T12 09/18/15 MRI s/p fall   . Constipation   . COPD (chronic obstructive pulmonary disease) ([place])    previous CXR with chronic interstitial lung dz   . CVA (cerebral vascular accident) ([place])   . Depression   . Diabetes ([place])     with neuropathy  . Diabetes mellitus, type 2 ([place])   . Diarrhea   . Double vision   . DVT (deep venous thrombosis) ([place])    right leg 10/2015 was on coumadin off as of 2017/2018 ; s/p IVC filter  . Enuresis   . Eye pain, right   . Fall   . Fatty liver    09/15/15 also mildly dilated pancreatitic duct rec MRCP small sub cm cyst hemangioma speeln mild right hydronephrorossi and prox. hydroureter, kidney stones, mild scarring kidneys  . Female stress incontinence   . Flank pain   . GERD (gastroesophageal reflux disease)    with small hiatal hernia   . Hard of hearing   . Heart disease   . History of kidney problems   . Hyperlipidemia    mixed  . Hypertension   . Hypothyroidism, postsurgical   . Impaired mobility and ADLs    uses rolling walker has caretaker 24/7 at home  . Leg edema   . Mixed incontinence urge and stress (female)(female)   . Neuropathy   . Osteoarthritis    DDD spine   . Osteoporosis with fracture    T12 compression fracture  . Photophobia   . Pulmonary embolism ([place])    10/2015 off coumadin as of 04/2016  . Pulmonary HTN (HCC)    mild pulm HTN, echo 10/09/15 EF 63-14%HFWYO 1 dd, RV systolic pressure increased   .  Recurrent UTI   . Sinus pressure   . Skin cancer    BCC jawline and scalp   . Thyroid disease    follows [facility]  . TIA (transient ischemic attack)    MRI 2009/2010 neg stroke   . Trigeminal neuralgia    Dr. Tomi Bamberger s/p gamma knife x 2, on Tegretol since 2011/2012 no increase in dose >200 mg bid rec per family per neurology   . Urinary frequency   . Urinary, incontinence, stress female    Dr Erlene Quan urology     Past Surgical History:  Procedure Laterality Date  . APPENDECTOMY     as a child, open  . BRAIN SURGERY     schwnnoma removal 1996   . brain tumor surgery    . BREAST SURGERY     breast bx  . CATARACT EXTRACTION    . CHOLECYSTECTOMY    . EYE SURGERY     cataract  . IVC FILTER PLACEMENT ([facility] HX)     Dr. Lucky Cowboy 10/2015    . LAPAROSCOPIC TUBAL LIGATION    . MOHS SURGERY     scalp 04/2014   . PERIPHERAL VASCULAR CATHETERIZATION N/A 10/11/2015   Procedure: IVC Filter Insertion;  Surgeon: Algernon Huxley, MD;  Location: [facility];  Service: Cardiovascular;  Laterality: N/A;  . PERIPHERAL VASCULAR THROMBECTOMY Bilateral 03/29/2018   Procedure: PERIPHERAL VASCULAR THROMBECTOMY;  Surgeon: Algernon Huxley, MD;  Location: [facility];  Service: Cardiovascular;  Laterality: Bilateral;  . PUBOVAGINAL SLING    . THROAT SURGERY    . THYROID SURGERY     tumor around vocal cords   . TOOTH EXTRACTION     winter 2018   . TOTAL THYROIDECTOMY  1976    Social History Social History   Tobacco Use  . Smoking status: Former Smoker    Packs/day: 0.50    Years: 20.00    Pack years: 10.00    Types: Cigarettes    Quit date: 09/20/1995    Years since quitting: 25.6  . Smokeless tobacco: Never Used  . Tobacco comment: quit 1996 smoked 20 years max 8 cig qd   Vaping Use  . Vaping Use: Never used  Substance Use Topics  . Alcohol use: No  . Drug use: No    Family History Family History  Problem Relation Age of Onset  . Heart disease Mother   . Diabetes Father   . Cancer Daughter        breast ca x 2 s/p mastectomy   No family history of bleeding/clotting disorders, porphyria or autoimmune disease   Allergies  Allergen Reactions  . Penicillins Shortness Of Breath, Rash and Other (See Comments)    Has patient had a PCN reaction causing immediate rash, facial/tongue/throat swelling, SOB or lightheadedness with hypotension: Yes Has patient had a PCN reaction causing severe rash involving mucus membranes or skin necrosis: No Has patient had a PCN reaction that required hospitalization No Has patient had a PCN reaction occurring within the last 10 years: No If all of the above answers are "NO", then may proceed with Cephalosporin use.  . Sulfa Antibiotics Shortness Of Breath, Rash and Other (See Comments)   . Amitiza [Lubiprostone]     N/v/d  . Aspirin Other (See Comments)    Reaction:  Unknown  Other reaction(s): Bleeding (intolerance) Per patient " causes nose to bleed" Can take 81 mg daily without any complications Other reaction(s): "bloody nose"   .  Penicillin G Other (See Comments)    Has patient had a PCN reaction causing immediate rash, facial/tongue/throat swelling, SOB or lightheadedness with hypotension: No Has patient had a PCN reaction causing severe rash involving mucus membranes or skin necrosis: Unknown Has patient had a PCN reaction that required hospitalization: Unknown Has patient had a PCN reaction occurring within the last 10 years: Unknown If all of the above answers are "NO", then may proceed with Cephalosporin use.     REVIEW OF SYSTEMS (Negative unless checked)  Constitutional: [] Weight loss  [] Fever  [] Chills Cardiac: [] Chest pain   [] Chest pressure   [] Palpitations   [] Shortness of breath when laying flat   [x] Shortness of breath with exertion. Vascular:  [] Pain in legs with walking   [] Pain in legs at rest  [x] History of DVT   [] Phlebitis   [x] Swelling in legs   [] Varicose veins   [] Non-healing ulcers Pulmonary:   [x] Uses home oxygen   [] Productive cough   [] Hemoptysis   [] Wheeze  [x] COPD   [x] Asthma Neurologic:  [] Dizziness   [] Seizures   [] History of stroke   [] History of TIA  [] Aphasia   [] Vissual changes   [] Weakness or numbness in arm   [] Weakness or numbness in leg Musculoskeletal:   [] Joint swelling   [x] Joint pain   [x] Low back pain Hematologic:  [] Easy bruising  [] Easy bleeding   [] Hypercoagulable state   [] Anemic Gastrointestinal:  [] Diarrhea   [] Vomiting  [x] Gastroesophageal reflux/heartburn   [] Difficulty swallowing. Genitourinary:  [x] Chronic kidney disease   [] Difficult urination  [] Frequent urination   [] Blood in urine Skin:  [] Rashes   [] Ulcers  Psychological:  [] History of anxiety   []  History of major depression.  Physical  Examination  There were no vitals filed for this visit. There is no height or weight on file to calculate BMI. Gen: WD/WN, NAD Head: [place]/AT, No temporalis wasting.  Ear/Nose/Throat: Hearing grossly intact, nares w/o erythema or drainage, poor dentition Eyes: PER, EOMI, sclera nonicteric.  Neck: Supple, no masses.  No bruit or JVD.  Pulmonary:  Good air movement, clear to auscultation bilaterally, no use of accessory muscles.  Cardiac: RRR, normal S1, S2, no Murmurs. Vascular: scattered varicosities present bilaterally.  Severe venous stasis changes to the legs bilaterally.  4+ soft pitting edema. Vessel Right Left  Radial Palpable Palpable  PT Palpable Palpable  DP Palpable Palpable  Gastrointestinal: soft, non-distended. No guarding/no peritoneal signs.  Musculoskeletal: M/S 5/5 throughout.  No deformity or atrophy.  Neurologic: CN 2-12 intact. Pain and light touch intact in extremities.  Symmetrical.  Speech is fluent. Motor exam as listed above. Psychiatric: Judgment intact, Mood & affect appropriate for pt's clinical situation. Dermatologic: Severe venous rashes no ulcers noted.  No changes consistent with cellulitis. Lymph : + lichenification / skin changes of chronic lymphedema.  CBC Lab Results  Component Value Date   WBC 7.4 04/26/2021   HGB 13.8 04/26/2021   HCT 43.1 04/26/2021   MCV 93.9 04/26/2021   PLT 184 04/26/2021    BMET    Component Value Date/Time   NA 140 04/26/2021 1848   K 3.5 04/26/2021 1848   CL 98 04/26/2021 1848   CO2 31 04/26/2021 1848   GLUCOSE 99 04/26/2021 1848   BUN 19 04/26/2021 1848   CREATININE 1.24 (H) 04/26/2021 1848   CALCIUM 8.5 (L) 04/26/2021 1848   GFRNONAA 41 (L) 04/26/2021 1848   GFRAA 39 (L) 07/09/2020 1234   Estimated Creatinine Clearance: 32.8 mL/min (A) (by C-G formula based on  SCr of 1.24 mg/dL (H)).  COAG Lab Results  Component Value Date   INR 1.4 (H) 08/01/2019   INR 4.0 (H) 07/30/2019   INR 3.7 (H) 07/28/2019     Radiology DG Chest 2 View  Result Date: 04/26/2021 CLINICAL DATA:  Dyspnea EXAM: CHEST - 2 VIEW COMPARISON:  05/06/2018 FINDINGS: Lung volumes are extremely small and pulmonary insufflation has decreased since prior examination. There is progressive bibasilar atelectasis. No pneumothorax or pleural effusion. Cardiac size is within normal limits. Pulmonary vascularity is normal when accounting for poor pulmonary insufflation. No acute bone abnormality. IMPRESSION: Progressive pulmonary hypoinflation. Electronically Signed   By: Fidela [place] MD   On: 04/26/2021 19:33   CT Chest Wo Contrast  Result Date: 05/04/2021 CLINICAL DATA:  Shortness of breath, chest pain.  Smoking history. EXAM: CT CHEST WITHOUT CONTRAST TECHNIQUE: Multidetector CT imaging of the chest was performed following the standard protocol without IV contrast. COMPARISON:  CT 08/13/2018 FINDINGS: Cardiovascular: Heart size is upper limits of normal. No pericardial effusion. Thoracic aorta is nonaneurysmal. Scattered atherosclerotic calcifications of the aorta and coronary arteries. Mediastinum/Nodes: No axillary or mediastinal lymphadenopathy. Hilar structures are difficult to evaluate in the absence of intravenous contrast. Trachea and esophagus within normal limits. Lungs/Pleura: Evaluation degraded by respiratory motion artifact. Bronchial wall thickening most pronounced within the lung bases. Chronic areas of post infectious scarring and bronchiectasis within the right upper lobe. There is consolidation and/or atelectasis within the inferior aspect of the lingula. No pleural effusion or pneumothorax. Upper Abdomen: No acute findings within the included portion of the upper abdomen Musculoskeletal: No acute osseous abnormality. Nonspecific nodular calcified density involving the right thyroid cartilage is unchanged from 2019, benign. No suspicious lytic or sclerotic bone lesions are identified. No chest wall abnormality. IMPRESSION: 1.  Small amount of inferior lingular consolidation and/or atelectasis. 2. Chronic bronchitic type lung changes with areas of post infectious scarring and bronchiectasis within the right upper lobe. 3. Aortic and coronary artery atherosclerosis (ICD10-I70.0). Electronically Signed   By: Davina Poke D.O.   On: 05/04/2021 13:37     Assessment/Plan 1. Lymphedema Recommend:  No surgery or intervention at this point in time.    I have reviewed my previous discussion with the patient regarding swelling and why it causes symptoms.  Patient will continue wearing graduated compression stockings class 1 (20-30 mmHg) on a daily basis. The patient will  beginning wearing the stockings first thing in the morning and removing them in the evening. The patient is instructed specifically not to sleep in the stockings.    In addition, behavioral modification including several periods of elevation of the lower extremities during the day will be continued.  This was reviewed with the patient during the initial visit.  The patient will also continue routine exercise, especially walking on a daily basis as was discussed during the initial visit.    Despite conservative treatments including graduated compression therapy class 1 and behavioral modification including exercise and elevation the patient  has not obtained adequate control of the lymphedema.  The patient still has stage 3 lymphedema and therefore, I believe that a lymph pump should be added to improve the control of the patient's lymphedema.  Additionally, a lymph pump is warranted because it will reduce the risk of cellulitis and ulceration in the future.  Patient should follow-up in six months.   A total of 60 minutes was spent with this patient and greater than 50% was spent in counseling and coordination of care  with the patient.  Discussion included the treatment options for vascular disease including indications for surgery and intervention.  Also  discussed is the appropriate timing of treatment.  In addition medical therapy was discussed.   2. Pulmonary artery hypertension ([place]) I have discussed the importance with both the daughter in person as well as the daughter by phone who is an Therapist, sports as well as the caregiver the importance of balancing lymphedema pump usage with cardiopulmonary comorbidities.  I have also handwritten note to the patient stressing the importance of wearing her oxygen during the night as well as during the day given that prolonged periods of desaturation at night because pulmonary fibrosis and right-sided congestive heart failure.  3. Chronic obstructive pulmonary disease, unspecified COPD type ([place]) Continue pulmonary medications and aerosols as already ordered, these medications have been reviewed and there are no changes at this time.    4. Type 2 diabetes mellitus with stage 3b chronic kidney disease, without long-term current use of insulin ([place]) Continue hypoglycemic medications as already ordered, these medications have been reviewed and there are no changes at this time.  Hgb A1C to be monitored as already arranged by primary service   5. Gastroesophageal reflux disease, unspecified whether esophagitis present Continue PPI as already ordered, this medication has been reviewed and there are no changes at this time.  Avoidence of caffeine and alcohol  Moderate elevation of the head of the bed    Hortencia Pilar, MD  05/09/2021 1:02 PM

## 2021-05-10 ENCOUNTER — Encounter (INDEPENDENT_AMBULATORY_CARE_PROVIDER_SITE_OTHER): Payer: Self-pay | Admitting: Vascular Surgery

## 2021-05-12 DIAGNOSIS — N1832 Chronic kidney disease, stage 3b: Secondary | ICD-10-CM | POA: Diagnosis not present

## 2021-05-12 DIAGNOSIS — I509 Heart failure, unspecified: Secondary | ICD-10-CM | POA: Diagnosis not present

## 2021-05-12 DIAGNOSIS — I10 Essential (primary) hypertension: Secondary | ICD-10-CM | POA: Diagnosis not present

## 2021-05-27 ENCOUNTER — Other Ambulatory Visit: Payer: Self-pay

## 2021-05-27 ENCOUNTER — Ambulatory Visit
Admission: RE | Admit: 2021-05-27 | Discharge: 2021-05-27 | Disposition: A | Discharge status: Home / Self Care | Payer: Medicare PPO | Source: Ambulatory Visit | Attending: Internal Medicine | Admitting: Internal Medicine

## 2021-05-27 DIAGNOSIS — J449 Chronic obstructive pulmonary disease, unspecified: Secondary | ICD-10-CM | POA: Insufficient documentation

## 2021-05-27 DIAGNOSIS — I350 Nonrheumatic aortic (valve) stenosis: Secondary | ICD-10-CM | POA: Insufficient documentation

## 2021-05-27 DIAGNOSIS — R9431 Abnormal electrocardiogram [ECG] [EKG]: Secondary | ICD-10-CM | POA: Diagnosis not present

## 2021-05-27 DIAGNOSIS — E119 Type 2 diabetes mellitus without complications: Secondary | ICD-10-CM | POA: Insufficient documentation

## 2021-05-27 DIAGNOSIS — E785 Hyperlipidemia, unspecified: Secondary | ICD-10-CM | POA: Diagnosis not present

## 2021-05-27 DIAGNOSIS — I2510 Atherosclerotic heart disease of native coronary artery without angina pectoris: Secondary | ICD-10-CM | POA: Insufficient documentation

## 2021-05-27 DIAGNOSIS — I639 Cerebral infarction, unspecified: Secondary | ICD-10-CM | POA: Insufficient documentation

## 2021-05-27 DIAGNOSIS — R0609 Other forms of dyspnea: Secondary | ICD-10-CM

## 2021-05-27 DIAGNOSIS — I119 Hypertensive heart disease without heart failure: Secondary | ICD-10-CM | POA: Insufficient documentation

## 2021-05-27 DIAGNOSIS — R0600 Dyspnea, unspecified: Secondary | ICD-10-CM | POA: Diagnosis not present

## 2021-05-27 LAB — ECHOCARDIOGRAM COMPLETE
AR max vel: 2.46 cm2
AV Area VTI: 2.26 cm2
AV Area mean vel: 2.26 cm2
AV Mean grad: 8 mmHg
AV Peak grad: 15.1 mmHg
Ao pk vel: 1.94 m/s
Area-P 1/2: 2.34 cm2
MV VTI: 2.7 cm2
S' Lateral: 2.3 cm

## 2021-05-27 NOTE — Progress Notes (Signed)
*  PRELIMINARY RESULTS* Echocardiogram 2D Echocardiogram has been performed.  Wallie Char Loretta Kluender 05/27/2021, 11:36 AM

## 2021-05-30 ENCOUNTER — Other Ambulatory Visit: Payer: Self-pay | Admitting: Pulmonary Disease

## 2021-05-31 DIAGNOSIS — I890 Lymphedema, not elsewhere classified: Secondary | ICD-10-CM | POA: Diagnosis not present

## 2021-06-02 ENCOUNTER — Encounter (INDEPENDENT_AMBULATORY_CARE_PROVIDER_SITE_OTHER): Payer: Self-pay

## 2021-06-03 ENCOUNTER — Telehealth (INDEPENDENT_AMBULATORY_CARE_PROVIDER_SITE_OTHER): Payer: Self-pay

## 2021-06-03 NOTE — Telephone Encounter (Signed)
The pt's 's daughter called an left a VM on the nurses line wanting to know how should her mom use her compression pumps due to her heart issues. Please advise.

## 2021-06-03 NOTE — Telephone Encounter (Signed)
I called the pts daughter and made her aware of the Np's instructions.

## 2021-06-03 NOTE — Telephone Encounter (Signed)
She should begin 15 minutes per day for 2 weeks.Marland KitchenMarland KitchenIf tolerated fne ( no shortness or breath/chest pain) she can increase to 30 minutes per day

## 2021-06-05 DIAGNOSIS — J449 Chronic obstructive pulmonary disease, unspecified: Secondary | ICD-10-CM | POA: Diagnosis not present

## 2021-06-08 ENCOUNTER — Other Ambulatory Visit: Payer: Self-pay

## 2021-06-08 ENCOUNTER — Other Ambulatory Visit: Payer: Self-pay | Admitting: Internal Medicine

## 2021-06-08 ENCOUNTER — Ambulatory Visit
Admission: RE | Admit: 2021-06-08 | Discharge: 2021-06-08 | Disposition: A | Discharge status: Home / Self Care | Payer: Medicare PPO | Source: Ambulatory Visit | Attending: Sports Medicine | Admitting: Sports Medicine

## 2021-06-08 ENCOUNTER — Telehealth: Payer: Self-pay | Admitting: Internal Medicine

## 2021-06-08 ENCOUNTER — Ambulatory Visit (INDEPENDENT_AMBULATORY_CARE_PROVIDER_SITE_OTHER): Payer: Medicare PPO

## 2021-06-08 VITALS — BP 137/71 | HR 64 | Temp 98.2°F | Resp 18 | Ht 67.0 in | Wt 200.0 lb

## 2021-06-08 DIAGNOSIS — R6883 Chills (without fever): Secondary | ICD-10-CM | POA: Insufficient documentation

## 2021-06-08 DIAGNOSIS — R059 Cough, unspecified: Secondary | ICD-10-CM | POA: Diagnosis not present

## 2021-06-08 DIAGNOSIS — Z882 Allergy status to sulfonamides status: Secondary | ICD-10-CM | POA: Insufficient documentation

## 2021-06-08 DIAGNOSIS — Z880 Allergy status to penicillin: Secondary | ICD-10-CM | POA: Diagnosis not present

## 2021-06-08 DIAGNOSIS — Z87891 Personal history of nicotine dependence: Secondary | ICD-10-CM | POA: Insufficient documentation

## 2021-06-08 DIAGNOSIS — Z20822 Contact with and (suspected) exposure to covid-19: Secondary | ICD-10-CM | POA: Insufficient documentation

## 2021-06-08 DIAGNOSIS — Z886 Allergy status to analgesic agent status: Secondary | ICD-10-CM | POA: Insufficient documentation

## 2021-06-08 DIAGNOSIS — N3000 Acute cystitis without hematuria: Secondary | ICD-10-CM

## 2021-06-08 DIAGNOSIS — R5381 Other malaise: Secondary | ICD-10-CM | POA: Diagnosis not present

## 2021-06-08 DIAGNOSIS — J449 Chronic obstructive pulmonary disease, unspecified: Secondary | ICD-10-CM | POA: Diagnosis not present

## 2021-06-08 LAB — URINALYSIS, COMPLETE (UACMP) WITH MICROSCOPIC
Bilirubin Urine: NEGATIVE
Glucose, UA: NEGATIVE mg/dL
Hgb urine dipstick: NEGATIVE
Ketones, ur: NEGATIVE mg/dL
Leukocytes,Ua: NEGATIVE
Nitrite: NEGATIVE
Protein, ur: NEGATIVE mg/dL
Specific Gravity, Urine: 1.015 (ref 1.005–1.030)
pH: 7 (ref 5.0–8.0)

## 2021-06-08 NOTE — ED Triage Notes (Signed)
Patient presents to [facility] with caregiver. Patient caregiver states that she has been sleeping a lot. Reports that daughter is a Therapist, sports and wanted her checked for UTI. States that she was recently on antibiotics of dental abscess.

## 2021-06-08 NOTE — Telephone Encounter (Signed)
Patient's son called in and states that the Patient is going through a lot. States the Patient has a tooth infection and they are waiting to hear from dental for a removal. States he and his sister believe the Patient may have a UTI as well. He is asking that the Patient have full blood work and urine done.   Patient's son informed Santiago Glad upfront that he would not take Patient to the walk in and did not want to speak to triage.   Spoke with Dr Olivia Mackie McLean-Scocuzza and she states for a consult fee she will place and result urine orders. Patient had comprehensive labs done 03/29/21 and they will not be covered again for 91 days.   Informed the son of the above. Informed him that lab orders would need an evaluation so the provider would know exactly what to order. Informed him that majority of our doctors are out of office this week and there are no appointments available until next week. Informed him that Dr Olivia Mackie McLean-Scocuzza recommends [facility] urgent care as they allow you to wait in the car until a room is open.   Patient's son verbalized understanding and states he will call over to [place] [facility] or [facility] urgent care to see who has the shortest wait time. States he will then take the Patient there.

## 2021-06-08 NOTE — Discharge Instructions (Addendum)
Urinalysis and chest x-ray were both negative for infection.  Please go to [facility] tomorrow and have your blood drawn for a complete blood count and metabolic panel.  Your malaise may very well be coming from the gabapentin using for pain relief for your dental infection.  Return for reevaluation, or follow-up with your primary care provider, for new or worsening symptoms.

## 2021-06-08 NOTE — ED Provider Notes (Signed)
[facility]    CSN: [id number] Arrival date & time: 06/08/21  1524      History   Chief Complaint Chief Complaint  Patient presents with   Dental Pain    HPI STARLYN Erickson is a 87 y.o. female.   HPI  87 year old female here for evaluation of increased sluggishness.  Patient is here with her caregiver and the family is concerned that the patient may have an infection due to the fact that she has had increased sluggishness over the past 4 days.  She was recently treated for a dental abscess with antibiotics.  Caregiver reports that the patient has been experiencing chills but has not had a measured fever, no nausea or vomiting, or change in appetite.  Patient does have a cough but the caregiver reports that the cough is improved.  Patient does have a history of COPD.  She is also very hard of hearing and communicates with a white board.  Past Medical History:  Diagnosis Date   Acoustic neuroma (HCC)    Allergy    Asthma    Bilateral swelling of feet    and legs   Bladder infection    CAD (coronary artery disease)    Cataract    Change in voice    Compression fracture of body of thoracic vertebra (HCC)    T12 09/18/15 MRI s/p fall    Constipation    COPD (chronic obstructive pulmonary disease) (HCC)    previous CXR with chronic interstitial lung dz    CVA (cerebral vascular accident) ([place])    Depression    Diabetes ([place])    with neuropathy   Diabetes mellitus, type 2 ([place])    Diarrhea    Double vision    DVT (deep venous thrombosis) ([place])    right leg 10/2015 was on coumadin off as of 2017/2018 ; s/p IVC filter   Enuresis    Eye pain, right    Fall    Fatty liver    09/15/15 also mildly dilated pancreatitic duct rec MRCP small sub cm cyst hemangioma speeln mild right hydronephrorossi and prox. hydroureter, kidney stones, mild scarring kidneys   Female stress incontinence    Flank pain    GERD (gastroesophageal reflux disease)    with small hiatal  hernia    Hard of hearing    Heart disease    History of kidney problems    Hyperlipidemia    mixed   Hypertension    Hypothyroidism, postsurgical    Impaired mobility and ADLs    uses rolling walker has caretaker 24/7 at home   Leg edema    Mixed incontinence urge and stress (female)(female)    Neuropathy    Osteoarthritis    DDD spine    Osteoporosis with fracture    T12 compression fracture   Photophobia    Pulmonary embolism ([place])    10/2015 off coumadin as of 04/2016   Pulmonary HTN (HCC)    mild pulm HTN, echo 10/09/15 EF 05-69%VXYIA 1 dd, RV systolic pressure increased    Recurrent UTI    Sinus pressure    Skin cancer    BCC jawline and scalp    Thyroid disease    follows [facility]   TIA (transient ischemic attack)    MRI 2009/2010 neg stroke    Trigeminal neuralgia    Dr. Tomi Bamberger s/p gamma knife x 2, on Tegretol since 2011/2012 no increase in dose >200 mg bid rec per family  per neurology    Urinary frequency    Urinary, incontinence, stress female    Dr Erlene Quan urology     Patient Active Problem List   Diagnosis Date Noted   Pulmonary artery hypertension ([place]) 05/03/2021   Stage 4 chronic kidney disease ([place]) 03/29/2021   Grade I diastolic dysfunction 19/50/9326   Obesity (BMI 30-39.9) 08/16/2020   Chronic kidney disease, stage 3b ([place]) 04/06/2020   Thrombocytopenia ([place]) 04/06/2020   Seborrheic keratoses 04/06/2020   Hip pain 09/30/2019   History of deep vein thrombosis (DVT) of lower extremity 09/30/2019   Arthritis 09/30/2019   Fall 03/11/2019   Epistaxis 12/11/2018   History of DVT (deep vein thrombosis) 11/12/2018   Bilateral leg edema 06/04/2018   Bilateral hearing loss 06/04/2018   Actinic keratosis 06/04/2018   Bronchiectasis ([place]) 06/04/2018   Depression 05/17/2018   Dehydration 05/15/2018   Hypertension 05/03/2018   Lymphedema 05/02/2018   Iron deficiency anemia 05/02/2018   Physical deconditioning 05/02/2018   Drug interaction  05/02/2018   Supratherapeutic INR 05/02/2018   Presence of IVC filter 05/02/2018   Anemia 04/11/2018   Fatigue 04/11/2018   Moderate protein-calorie malnutrition ([place]) 03/22/2018   Orthostasis 03/05/2018   Dizziness 03/04/2018   COPD (chronic obstructive pulmonary disease) ([place]) 12/13/2017   Anxiety and depression 12/13/2017   Trigeminal neuralgia 12/13/2017   GERD (gastroesophageal reflux disease) 12/13/2017   Type 2 diabetes mellitus with stage 3b chronic kidney disease, without long-term current use of insulin ([place]) 12/13/2017   Constipation 12/13/2017   Insomnia 12/13/2017   Hypothyroidism 12/13/2017   UTI (urinary tract infection) 10/08/2015   Low back pain 10/08/2015   Collapsed vertebra, not elsewhere classified, thoracic region, initial encounter for fracture ([place]) 09/20/2015   Basal cell carcinoma of scalp 04/06/2014   Fothergill's neuralgia 08/05/2013   Bladder infection, chronic 02/11/2013   Female genuine stress incontinence 02/11/2013   Incomplete bladder emptying 02/11/2013   Intrinsic sphincter deficiency 02/11/2013   Mixed incontinence 02/11/2013   Excessive urination at night 02/11/2013   Bladder retention 02/11/2013   FOM (frequency of micturition) 02/11/2013   Basal cell carcinoma of face 09/13/2011    Past Surgical History:  Procedure Laterality Date   APPENDECTOMY     as a child, open   BRAIN SURGERY     schwnnoma removal 1996    brain tumor surgery     BREAST SURGERY     breast bx   CATARACT EXTRACTION     CHOLECYSTECTOMY     EYE SURGERY     cataract   IVC FILTER PLACEMENT ([place] [facility])     Dr. Lucky Cowboy 10/2015    LAPAROSCOPIC TUBAL LIGATION     MOHS SURGERY     scalp 04/2014    PERIPHERAL VASCULAR CATHETERIZATION N/A 10/11/2015   Procedure: IVC Filter Insertion;  Surgeon: Algernon Huxley, MD;  Location: [facility];  Service: Cardiovascular;  Laterality: N/A;   PERIPHERAL VASCULAR THROMBECTOMY Bilateral 03/29/2018   Procedure: PERIPHERAL VASCULAR  THROMBECTOMY;  Surgeon: Algernon Huxley, MD;  Location: [facility];  Service: Cardiovascular;  Laterality: Bilateral;   PUBOVAGINAL SLING     THROAT SURGERY     THYROID SURGERY     tumor around vocal cords    TOOTH EXTRACTION     winter 2018    TOTAL THYROIDECTOMY  1976    OB History   No obstetric history on file.      Home Medications    Prior to Admission medications  Medication Sig Start Date End Date Taking? Authorizing Provider  acetaminophen (TYLENOL) 325 MG tablet Take 2 tablets (650 mg total) by mouth every 6 (six) hours as needed. 07/30/20  Yes McLean-Scocuzza, Nino Glow, MD  acetaminophen-codeine (TYLENOL #3) 300-30 MG tablet Take 1 tablet by mouth 2 (two) times daily as needed for moderate pain. 04/14/20  Yes McLean-Scocuzza, Nino Glow, MD  albuterol (PROVENTIL) (2.5 MG/3ML) 0.083% nebulizer solution Take 3 mLs (2.5 mg total) by nebulization every 6 (six) hours as needed for wheezing or shortness of breath. 05/05/21  Yes Chesley Mires, MD  apixaban (ELIQUIS) 2.5 MG TABS tablet Take 1 tablet (2.5 mg total) by mouth 2 (two) times daily. 03/29/21  Yes McLean-Scocuzza, Nino Glow, MD  atorvastatin (LIPITOR) 40 MG tablet Take 1 tablet (40 mg total) by mouth daily at 6 PM. Generic ok 03/29/21  Yes McLean-Scocuzza, Nino Glow, MD  Azelaic Acid 15 % gel Apply 1 application topically 2 (two) times daily. After skin is thoroughly washed and patted dry, gently but thoroughly massage a thin film of azelaic acid cream into the affected area twice daily, in the morning and evening. 03/29/21  Yes McLean-Scocuzza, Nino Glow, MD  azithromycin (ZITHROMAX) 500 MG tablet Take 1 tablet (500 mg total) by mouth daily. 05/05/21  Yes Chesley Mires, MD  Calcium Carbonate-Vitamin D (CALCIUM 600+D) 600-400 MG-UNIT tablet Take 1 tablet by mouth 2 (two) times daily. Lunch and dinner 07/23/19  Yes McLean-Scocuzza, Nino Glow, MD  cetirizine (ZYRTEC) 10 MG tablet Take 1 tablet (10 mg total) by mouth daily. 03/29/21  Yes  McLean-Scocuzza, Nino Glow, MD  clotrimazole-betamethasone (LOTRISONE) lotion SMARTSIG:In Ear(s) 10/11/20  Yes [provider]  Fluticasone-Umeclidin-Vilant (TRELEGY ELLIPTA) 100-62.5-25 MCG/INH AEPB Inhale 1 puff into the lungs daily. Rinse mouth d/c symbicort and spiriva 03/29/21  Yes McLean-Scocuzza, Nino Glow, MD  furosemide (LASIX) 40 MG tablet Take 1 tablet (40 mg total) by mouth daily. 05/05/21 05/05/22 Yes Chesley Mires, MD  gabapentin (NEURONTIN) 400 MG capsule Take 1 capsule (400 mg total) by mouth 3 (three) times daily. 11/11/20  Yes McLean-Scocuzza, Nino Glow, MD  guaiFENesin (MUCINEX) 600 MG 12 hr tablet Take 2 tablets (1,200 mg total) by mouth 2 (two) times daily as needed for cough or to loosen phlegm. 05/05/21  Yes Chesley Mires, MD  hydrocortisone 2.5 % cream Apply topically 2 (two) times daily. 04/06/20  Yes McLean-Scocuzza, Nino Glow, MD  levothyroxine (SYNTHROID) 200 MCG tablet Take 1 tablet (200 mcg total) by mouth daily before breakfast. Except on Sunday. Do not take with other medications or vitamins 03/29/21  Yes McLean-Scocuzza, Nino Glow, MD  Melatonin 10 MG TABS Take 10 mg by mouth at bedtime. 04/19/21  Yes McLean-Scocuzza, Nino Glow, MD  mirtazapine (REMERON) 15 MG tablet Take 1 tablet (15 mg total) by mouth at bedtime. 03/29/21  Yes McLean-Scocuzza, Nino Glow, MD  Multiple Vitamin (MULTIVITAMIN WITH MINERALS) TABS tablet Take 1 tablet by mouth daily.    Yes [provider]  mupirocin ointment (BACTROBAN) 2 % Apply 1 application topically 3 (three) times daily. Right thumb 03/29/21  Yes McLean-Scocuzza, Nino Glow, MD  pantoprazole (PROTONIX) 40 MG tablet Take 1 tablet (40 mg total) by mouth daily. 30 minutes before lunch or dinner 03/29/21  Yes McLean-Scocuzza, Nino Glow, MD  polyethylene glycol powder (GLYCOLAX/MIRALAX) 17 GM/SCOOP powder Take 17 g by mouth daily. 04/06/20  Yes McLean-Scocuzza, Nino Glow, MD  polyvinyl alcohol (LIQUIFILM TEARS) 1.4 % ophthalmic solution Place 1 drop into both  eyes as needed for  dry eyes. 04/06/20  Yes McLean-Scocuzza, Nino Glow, MD    Family History Family History  Problem Relation Age of Onset   Heart disease Mother    Diabetes Father    Cancer Daughter        breast ca x 2 s/p mastectomy     Social History Social History   Tobacco Use   Smoking status: Former    Packs/day: 0.50    Years: 20.00    Pack years: 10.00    Types: Cigarettes    Quit date: 09/20/1995    Years since quitting: 25.7   Smokeless tobacco: Never   Tobacco comments:    quit 1996 smoked 20 years max 8 cig qd   Vaping Use   Vaping Use: Never used  Substance Use Topics   Alcohol use: No   Drug use: No     Allergies   Penicillins, Sulfa antibiotics, Amitiza [lubiprostone], Aspirin, and Penicillin g   Review of Systems Review of Systems  Constitutional:  Positive for fatigue. Negative for activity change, appetite change and fever.  HENT:  Positive for dental problem.   Respiratory:  Positive for cough.   Genitourinary:  Negative for dysuria, frequency and urgency.  Skin:  Negative for rash.  Hematological: Negative.   Psychiatric/Behavioral: Negative.      Physical Exam Triage Vital Signs ED Triage Vitals  Enc Vitals Group     BP 06/08/21 1603 137/71     Pulse Rate 06/08/21 1603 64     Resp 06/08/21 1603 18     Temp 06/08/21 1603 98.2 F (36.8 C)     Temp Source 06/08/21 1603 Oral     SpO2 06/08/21 1603 93 %     Weight 06/08/21 1606 199 lb 15.3 oz (90.7 kg)     Height 06/08/21 1606 5\' 7"  (1.702 m)     Head Circumference --      Peak Flow --      Pain Score --      Pain Loc --      Pain Edu? --      Excl. in [place]? --    No data found.  Updated Vital Signs BP 137/71 (BP Location: Right Arm)   Pulse 64   Temp 98.2 F (36.8 C) (Oral)   Resp 18   Ht 5\' 7"  (1.702 m)   Wt 199 lb 15.3 oz (90.7 kg)   LMP  (LMP Unknown)   SpO2 93%   BMI 31.32 kg/m   Visual Acuity Right Eye Distance:   Left Eye Distance:   Bilateral Distance:     Right Eye Near:   Left Eye Near:    Bilateral Near:     Physical Exam Vitals and nursing note reviewed.  Constitutional:      General: She is not in acute distress.    Appearance: Normal appearance. She is normal weight. She is not ill-appearing.  HENT:     Head: Normocephalic and atraumatic.     Mouth/Throat:     Mouth: Mucous membranes are moist.     Pharynx: Oropharynx is clear. No oropharyngeal exudate or posterior oropharyngeal erythema.  Cardiovascular:     Rate and Rhythm: Normal rate and regular rhythm.     Pulses: Normal pulses.     Heart sounds: Normal heart sounds. No murmur heard.   No gallop.  Pulmonary:     Effort: Pulmonary effort is normal.     Breath sounds: Rales present. No wheezing or rhonchi.  Musculoskeletal:  Cervical back: Normal range of motion and neck supple.  Lymphadenopathy:     Cervical: No cervical adenopathy.  Skin:    General: Skin is warm and dry.     Capillary Refill: Capillary refill takes less than 2 seconds.     Findings: No erythema or rash.  Neurological:     General: No focal deficit present.     Mental Status: She is alert and oriented to person, place, and time.  Psychiatric:        Mood and Affect: Mood normal.        Behavior: Behavior normal.        Thought Content: Thought content normal.        Judgment: Judgment normal.     UC Treatments / Results  Labs (all labs ordered are listed, but only abnormal results are displayed) Labs Reviewed  URINALYSIS, COMPLETE (UACMP) WITH MICROSCOPIC - Abnormal; Notable for the following components:      Result Value   Bacteria, UA RARE (*)    All other components within normal limits  SARS CORONAVIRUS 2 (TAT 6-24 HRS)    EKG   Radiology DG Chest 2 View  Result Date: 06/08/2021 CLINICAL DATA:  Decreased alertness and elderly.  Cough and chills. EXAM: CHEST - 2 VIEW COMPARISON:  Radiograph 04/26/2021.  CT 05/04/2021 FINDINGS: Persistent low lung volumes. Streaky bibasilar  atelectasis or scarring, similar to prior exam. No acute airspace disease. Stable upper mediastinal contours, heart size is obscured. No pleural effusion or pneumothorax. No pulmonary edema. Stable osseous structures with vertebral augmentation at the thoracolumbar junction. IMPRESSION: Persistent low lung volumes with streaky bibasilar atelectasis or scarring. Electronically Signed   By: Keith Rake M.D.   On: 06/08/2021 17:47    Procedures Procedures (including critical care time)  Medications Ordered in UC Medications - No data to display  Initial Impression / Assessment and Plan / UC Course  I have reviewed the triage vital signs and the nursing notes.  Pertinent labs & imaging results that were available during my care of the patient were reviewed by me and considered in my medical decision making (see chart for details).  Patient is a very pleasant, nontoxic-appearing 87 year old female who is hard of hearing is here with her caregiver for evaluation of increased sluggishness.  Patient has been recently under care for a dental infection and the caregiver reports that the family has been rotating pain medication to include Tylenol and gabapentin.  The patient is not had a fever but has had some chills.  Patient's physical exam reveals pink and moist oral mucosa.  Patient does have a significant number of missing teeth.  There is no drainage from any of the teeth and there is no erythema of the gum tissue.  Patient is a cervical lymphadenopathy appreciated exam.  Cardiopulmonary exam reveals coarse lung sounds and bilateral bases without overt wheezes rhonchi.  Urinalysis was collected at triage.  Family is requesting CBC and CMP.  Will obtain chest x-ray due to coarse lung sounds.  Urinalysis shows rare bacteria but is otherwise completely unremarkable.  Patient and caregiver informed that we cannot draw blood as our lab has closed for the day.  The caregiver also reports that the patient  has been complaining of pain in her right groin for the last approximate week.  The caregiver reports that the patient rides an exercise bike every day but for the last several days she has had to help her get on and off the  bike and manipulate her left leg.  Physical exam reveals tenderness with palpation of the left groin.  Suspect patient may have pulled a groin muscle from the exercise bike and have asked her to refrain from riding the bike for least the next week.  Chest x-ray independently reviewed and evaluated by me.  Patient has profound kyphosis but no obvious infiltrate noted on exam.  Awaiting radiology overread. Radiology overread reports persistent low lung volumes and bibasilar atelectasis but no acute findings.  Will discharge patient home with a diagnosis of malaise and order outpatient CBC and CMP from [facility].  Patient has no significant findings on her work-up here in the urgent care.   Final Clinical Impressions(s) / UC Diagnoses   Final diagnoses:  Malaise     Discharge Instructions      Urinalysis and chest x-ray were both negative for infection.  [facility] and have your blood drawn for a complete blood count and metabolic panel.  Your malaise may very well be coming from the gabapentin using for pain relief for your dental infection.  Return for reevaluation, or follow-up with your primary care provider, for new or worsening symptoms.     ED Prescriptions   None    PDMP not reviewed this encounter.   Margarette Canada, NP 06/08/21 1806

## 2021-06-09 DIAGNOSIS — R5381 Other malaise: Secondary | ICD-10-CM | POA: Diagnosis not present

## 2021-06-09 LAB — SARS CORONAVIRUS 2 (TAT 6-24 HRS): SARS Coronavirus 2: NEGATIVE

## 2021-06-10 ENCOUNTER — Telehealth: Payer: Self-pay | Admitting: Internal Medicine

## 2021-06-10 LAB — COMPREHENSIVE METABOLIC PANEL
ALT: 32 IU/L (ref 0–32)
AST: 35 IU/L (ref 0–40)
Albumin/Globulin Ratio: 1.5 (ref 1.2–2.2)
Albumin: 3.7 g/dL (ref 3.5–4.6)
Alkaline Phosphatase: 74 IU/L (ref 44–121)
BUN/Creatinine Ratio: 13 (ref 12–28)
BUN: 19 mg/dL (ref 10–36)
Bilirubin Total: 0.3 mg/dL (ref 0.0–1.2)
CO2: 30 mmol/L — ABNORMAL HIGH (ref 20–29)
Calcium: 9.3 mg/dL (ref 8.7–10.3)
Chloride: 100 mmol/L (ref 96–106)
Creatinine, Ser: 1.5 mg/dL — ABNORMAL HIGH (ref 0.57–1.00)
Globulin, Total: 2.5 g/dL (ref 1.5–4.5)
Glucose: 141 mg/dL — ABNORMAL HIGH (ref 65–99)
Potassium: 4.4 mmol/L (ref 3.5–5.2)
Sodium: 143 mmol/L (ref 134–144)
Total Protein: 6.2 g/dL (ref 6.0–8.5)
eGFR: 32 mL/min/{1.73_m2} — ABNORMAL LOW (ref >59)

## 2021-06-10 LAB — CBC WITH DIFF/PLATELET
Basophils Absolute: 0 10*3/uL (ref 0.0–0.2)
Basos: 0 %
EOS (ABSOLUTE): 0.3 10*3/uL (ref 0.0–0.4)
Eos: 4 %
Hematocrit: 41.3 % (ref 34.0–46.6)
Hemoglobin: 13.8 g/dL (ref 11.1–15.9)
Immature Grans (Abs): 0 10*3/uL (ref 0.0–0.1)
Immature Granulocytes: 0 %
Lymphocytes Absolute: 1.8 10*3/uL (ref 0.7–3.1)
Lymphs: 29 %
MCH: 30.1 pg (ref 26.6–33.0)
MCHC: 33.4 g/dL (ref 31.5–35.7)
MCV: 90 fL (ref 79–97)
Monocytes Absolute: 0.5 10*3/uL (ref 0.1–0.9)
Monocytes: 8 %
Neutrophils Absolute: 3.6 10*3/uL (ref 1.4–7.0)
Neutrophils: 59 %
Platelets: 185 10*3/uL (ref 150–450)
RBC: 4.58 x10E6/uL (ref 3.77–5.28)
RDW: 12.9 % (ref 11.7–15.4)
WBC: 6.2 10*3/uL (ref 3.4–10.8)

## 2021-06-10 NOTE — Telephone Encounter (Signed)
Pt daughter called and wanted to know how longer her mother needed to be off of apixaban (ELIQUIS) 2.5 MG TABS tablet she is having two teeth pulled on Monday

## 2021-06-10 NOTE — Telephone Encounter (Signed)
I called the patient's daughter and informed her that per Dr. Vickie Epley is the dentist stated the eliquis doe not have to be held for 2 teeth to be pulled then there is no need to hold the eliquis and she understood. Lella Mullany,cma

## 2021-06-10 NOTE — Telephone Encounter (Signed)
If dentist states low risk bleed procedure do not hold   If she has an issue with there is a risk holding medication for blood thinners which could be recurrent DVT/PE could stop Friday night and resume Monday after dental extraction

## 2021-06-10 NOTE — Telephone Encounter (Signed)
H/o DVT/PE Dr. Tasia Catchings can you comment on this?  Inform daughter to call Dr. Collie Siad office

## 2021-06-13 ENCOUNTER — Telehealth: Payer: Self-pay | Admitting: *Deleted

## 2021-06-13 NOTE — Telephone Encounter (Signed)
Called and spoke with Patient's caregiver. Patient had her procedure today and has started back on the Eliquis. Patient had been made aware of this last week.

## 2021-06-13 NOTE — Telephone Encounter (Signed)
Inform daughter Daughter Yus response but see telephone from 06/10/21 with patient  Thank you

## 2021-06-13 NOTE — Telephone Encounter (Signed)
Provider called would like to speak with Dr. Tasia Catchings regarding this patient. [phone]

## 2021-06-13 NOTE — Telephone Encounter (Signed)
Provider would like to speak with Dr. Tasia Catchings regarding this patient. [phone]

## 2021-06-14 NOTE — Telephone Encounter (Signed)
Called yesterday and today and no answer. Dr. Terese Door reached out to Dr. Tasia Catchings yesterday via phone notes regarding this pt's anticoagulation.

## 2021-06-21 ENCOUNTER — Encounter: Payer: Self-pay | Admitting: Oncology

## 2021-06-28 ENCOUNTER — Other Ambulatory Visit: Payer: Self-pay

## 2021-06-28 ENCOUNTER — Ambulatory Visit
Admission: RE | Admit: 2021-06-28 | Discharge: 2021-06-28 | Disposition: A | Discharge status: Home / Self Care | Payer: Medicare PPO | Attending: Internal Medicine | Admitting: Internal Medicine

## 2021-06-28 ENCOUNTER — Encounter: Payer: Self-pay | Admitting: Internal Medicine

## 2021-06-28 ENCOUNTER — Ambulatory Visit: Payer: Medicare PPO | Admitting: Internal Medicine

## 2021-06-28 ENCOUNTER — Ambulatory Visit
Admission: RE | Admit: 2021-06-28 | Discharge: 2021-06-28 | Disposition: A | Discharge status: Home / Self Care | Payer: Medicare PPO | Source: Ambulatory Visit | Attending: Internal Medicine | Admitting: Internal Medicine

## 2021-06-28 VITALS — BP 110/72 | HR 75 | Temp 97.3°F | Ht 67.0 in | Wt 205.0 lb

## 2021-06-28 DIAGNOSIS — R1032 Left lower quadrant pain: Secondary | ICD-10-CM

## 2021-06-28 DIAGNOSIS — R7303 Prediabetes: Secondary | ICD-10-CM | POA: Diagnosis not present

## 2021-06-28 DIAGNOSIS — J449 Chronic obstructive pulmonary disease, unspecified: Secondary | ICD-10-CM | POA: Diagnosis not present

## 2021-06-28 DIAGNOSIS — M545 Low back pain, unspecified: Secondary | ICD-10-CM

## 2021-06-28 DIAGNOSIS — M25552 Pain in left hip: Secondary | ICD-10-CM | POA: Diagnosis not present

## 2021-06-28 DIAGNOSIS — G500 Trigeminal neuralgia: Secondary | ICD-10-CM

## 2021-06-28 DIAGNOSIS — K5900 Constipation, unspecified: Secondary | ICD-10-CM

## 2021-06-28 DIAGNOSIS — I878 Other specified disorders of veins: Secondary | ICD-10-CM | POA: Diagnosis not present

## 2021-06-28 DIAGNOSIS — I890 Lymphedema, not elsewhere classified: Secondary | ICD-10-CM | POA: Diagnosis not present

## 2021-06-28 DIAGNOSIS — M1612 Unilateral primary osteoarthritis, left hip: Secondary | ICD-10-CM | POA: Diagnosis not present

## 2021-06-28 DIAGNOSIS — J479 Bronchiectasis, uncomplicated: Secondary | ICD-10-CM

## 2021-06-28 DIAGNOSIS — N1832 Chronic kidney disease, stage 3b: Secondary | ICD-10-CM | POA: Diagnosis not present

## 2021-06-28 MED ORDER — FUROSEMIDE 40 MG PO TABS: ORAL_TABLET | ORAL | 0 refills | Status: DC

## 2021-06-28 MED ORDER — ACETAMINOPHEN-CODEINE #3 300-30 MG PO TABS
1.0000 | ORAL_TABLET | Freq: Two times a day (BID) | ORAL | 0 refills | Status: DC | PRN
Start: 2021-06-28 — End: 2021-08-04

## 2021-06-28 NOTE — Patient Instructions (Addendum)
Dr. Candiss Norse or Dr. Dora Sims (renal/kidney doctors) to consider changing to  Weigh daily Lasix 40 mg daily if weight gaining 4-5 lbs then give 2nd dose at lunch lasix 40 mg daily  Xray left hip and low back [facility]    Will order PT/OT for left hip groin pain   I sent Dr .Delana Meyer a message re: time of the lymph pumps   Use nebulizer at least 2x per day    Constipation Senna S (senna laxative and colace stool softer  Warm prune juice   Lymphedema  Lymphedema is swelling that is caused by the abnormal collection of lymph in the tissues under the skin. Lymph is excess fluid from the tissues in your body that is removed through the lymphatic system. This system is part of your body's defense system (immune system) and includes lymph nodes and lymph vessels. The lymph vessels collect and carry the excess fluid, fats, proteins, and waste from the tissues of the body to the bloodstream. This system also works to clean and remove bacteria andwaste products from the body. Lymphedema occurs when the lymphatic system is blocked. When the lymph vessels or lymph nodes are blocked or damaged, lymph does not drain properly. This causes an abnormal buildup of lymph, which leads to swelling in the affected area. This may include the trunk area, or an arm or leg. Lymphedema cannot becured by medicines, but various methods can be used to help reduce the swelling. What are the causes? The cause of this condition depends on the type of lymphedema that you have. Primary lymphedema is caused by the absence of lymph vessels or having abnormal lymph vessels at birth. Secondary lymphedema occurs when lymph vessels are blocked or damaged. Secondary lymphedema is more common. Common causes of lymph vessel blockage include: Skin infection, such as cellulitis. Infection by parasites (filariasis). Injury. Radiation therapy. Cancer. Formation of scar tissue. Surgery. What are the signs or  symptoms? Symptoms of this condition include: Swelling of the arm or leg. A heavy or tight feeling in the arm or leg. Swelling of the feet, toes, or fingers. Shoes or rings may fit more tightly than before. Redness of the skin over the affected area. Limited movement of the affected limb. Sensitivity to touch or discomfort in the affected limb. How is this diagnosed? This condition may be diagnosed based on: Your symptoms and medical history. A physical exam. Bioimpedance spectroscopy. In this test, painless electrical currents are used to measure fluid levels in your body. Imaging tests, such as: MRI. CT scan. Duplex ultrasound. This test uses sound waves to produce images of the vessels and the blood flow on a screen. Lymphoscintigraphy. In this test, a low dose of a radioactive substance is injected to trace the flow of lymph through your lymph vessels. Lymphangiography. In this test, a contrast dye is injected into the lymph vessel to help show blockages. How is this treated?  If an underlying condition is causing the lymphedema, that condition will betreated. For example, antibiotic medicines may be used to treat an infection. Treatment for this condition will depend on the cause of your lymphedema. Treatment may include: Complete decongestive therapy (CDT). This is done by a certified lymphedema therapist to reduce fluid congestion. This therapy includes: Skin care. Compression wrapping of the affected area. Manual lymph drainage. This is a special massage technique that promotes lymph drainage out of a limb. Specific exercises. Certain exercises can help fluid move out of the affected limb. Compression. Various  methods may be used to apply pressure to the affected limb to reduce the swelling. They include: Wearing compression stockings or sleeves on the affected limb. Wrapping the affected limb with special bandages. Surgery. This is usually done for severe cases only. For  example, surgery may be done if you have trouble moving the limb or if the swelling does not get better with other treatments. Follow these instructions at home: Self-care The affected area is more likely to become injured or infected. Take these steps to help prevent infection: Keep the affected area clean and dry. Use approved creams or lotions to keep the skin moisturized. Protect your skin from cuts: Use gloves while cooking or gardening. Do not walk barefoot. If you shave the affected area, use an Copy. Do not wear tight clothes, shoes, or jewelry. Eat a healthy diet that includes a lot of fruits and vegetables. Activity Do exercises as told by your health care provider. Do not sit with your legs crossed. When possible, keep the affected limb raised (elevated) above the level of your heart. Avoid carrying things with an arm that is affected by lymphedema. General instructions Wear compression stockings or sleeves as told by your health care provider. Note any changes in size of the affected limb. You may be instructed to take regular measurements and keep track of them. Take over-the-counter and prescription medicines only as told by your health care provider. If you were prescribed an antibiotic medicine, take or apply it as told by your health care provider. Do not stop using the antibiotic even if you start to feel better or if your condition improves. Do not use heating pads or ice packs on the affected area. Avoid having blood draws, IV insertions, or blood pressure checks on the affected limb. Keep all follow-up visits. This is important. Contact a health care provider if you: Continue to have swelling in your limb. Have fluid leaking from the skin of your swollen limb. Have a cut that does not heal. Have redness or pain in the affected area. Develop purplish spots, rash, blisters, or sores (lesions) on your affected limb. Get help right away if you: Have new  swelling in your limb that starts suddenly. Have shortness of breath or chest pain. Have a fever or chills. These symptoms may represent a serious problem that is an emergency. Do not wait to see if the symptoms will go away. Get medical help right away. Call your local emergency services (911 in the [place] Do not drive yourself to the hospital. Summary Lymphedema is swelling that is caused by the abnormal collection of lymph in the tissues under the skin. Lymph is fluid from the tissues in your body that is removed through the lymphatic system. This system collects and carries excess fluid, fats, proteins, and wastes from the tissues of the body to the bloodstream. Lymphedema causes swelling, pain, and redness in the affected area. This may include the trunk area, or an arm or leg. Treatment for this condition may depend on the cause of your lymphedema. Treatment may include treating the underlying cause, complete decongestive therapy (CDT), compression methods, or surgery. This information is not intended to replace advice given to you by your health care provider. Make sure you discuss any questions you have with your healthcare provider. Document Revised: 09/15/2020 Document Reviewed: 09/15/2020 [employer] Patient Education  2022 [employer].

## 2021-06-28 NOTE — Progress Notes (Addendum)
Chief Complaint  Patient presents with   Follow-up   F/u with daughter Leitha Schuller former nurse  1. Left groin pain 5/10 intermittent x weeks went to [facility] but still present but affecting gait daughter wants PT/OT she baseline walks with rollator but mobility limited due to pain  Will refill tylenol #3  2. CKD 3B est [place] [facility] per daughter did not enjoy last visit with MD and he was abrupt and asked why pt was there disc Dr. Allison Quarry 3. DM 2 off januvia 25 to 50 mg due to cost  4. 07/05/21 will move with son [name] 5. Chronic lymphedema stage 3 per [facility] Dr.Schnier doing 15 min x 1 week and increased to 30 min per daughter per [facility] doc will limit time due to cardiopulm status will cc Dr. Delana Meyer and ask why or reasoning for this 15 min qd is helping on lasix 40 mg qd to bid disc with pt and daughter if gaining 4-5 lbs take 2nd dose at [facility]  6. Constipation disc senna s and colace prn  7. Pending eval with [facility] 8. Copd with hypoxia on 3L 02 at times O2 destat to 88 but at times not wearing O2    Review of Systems  Constitutional:  Negative for weight loss.  HENT:  Negative for hearing loss.   Eyes:  Negative for blurred vision.  Respiratory:  Positive for shortness of breath and wheezing.   Cardiovascular:  Negative for chest pain.  Gastrointestinal:  Negative for abdominal pain.  Musculoskeletal:  Positive for joint pain.  Skin:  Negative for rash.  Past Medical History:  Diagnosis Date   Acoustic neuroma (HCC)    Allergy    Asthma    Bilateral swelling of feet    and legs   Bladder infection    CAD (coronary artery disease)    Cataract    Change in voice    Compression fracture of body of thoracic vertebra (HCC)    T12 09/18/15 MRI s/p fall    Constipation    COPD (chronic obstructive pulmonary disease) (HCC)    previous CXR with chronic interstitial lung dz    CVA (cerebral vascular accident) ([place])    Depression    Diabetes ([place])    with neuropathy    Diabetes mellitus, type 2 ([place])    Diarrhea    Double vision    DVT (deep venous thrombosis) ([place])    right leg 10/2015 was on coumadin off as of 2017/2018 ; s/p IVC filter   Enuresis    Eye pain, right    Fall    Fatty liver    09/15/15 also mildly dilated pancreatitic duct rec [facility]CP small sub cm cyst hemangioma speeln mild right hydronephrorossi and prox. hydroureter, kidney stones, mild scarring kidneys   Female stress incontinence    Flank pain    GERD (gastroesophageal reflux disease)    with small hiatal hernia    Hard of hearing    Heart disease    History of kidney problems    Hyperlipidemia    mixed   Hypertension    Hypothyroidism, postsurgical    Impaired mobility and ADLs    uses rolling walker has caretaker 24/7 at home   Leg edema    Mixed incontinence urge and stress (female)(female)    Neuropathy    Osteoarthritis    DDD spine    Osteoporosis with fracture    T12 compression fracture   Photophobia  Pulmonary embolism ([place])    10/2015 off coumadin as of 04/2016   Pulmonary HTN (HCC)    mild pulm HTN, echo 10/09/15 EF 29-02%XJDBZ 1 dd, RV systolic pressure increased    Recurrent UTI    Sinus pressure    Skin cancer    BCC jawline and scalp    Thyroid disease    follows [facility]   TIA (transient ischemic attack)    MRI 2009/2010 neg stroke    Trigeminal neuralgia    Dr. Tomi Bamberger s/p gamma knife x 2, on Tegretol since 2011/2012 no increase in dose >200 mg bid rec per family per neurology    Urinary frequency    Urinary, incontinence, stress female    Dr Erlene Quan urology    Past Surgical History:  Procedure Laterality Date   APPENDECTOMY     as a child, open   [place] removal 1996    brain tumor surgery     BREAST SURGERY     breast bx   CATARACT EXTRACTION     CHOLECYSTECTOMY     EYE SURGERY     cataract   IVC FILTER PLACEMENT ([place] HX)     Dr. Lucky Cowboy 10/2015    LAPAROSCOPIC TUBAL LIGATION     MOHS SURGERY     scalp  04/2014    PERIPHERAL VASCULAR CATHETERIZATION N/A 10/11/2015   Procedure: IVC Filter Insertion;  Surgeon: Algernon Huxley, MD;  Location: [facility];  Service: Cardiovascular;  Laterality: N/A;   PERIPHERAL VASCULAR THROMBECTOMY Bilateral 03/29/2018   Procedure: PERIPHERAL VASCULAR THROMBECTOMY;  Surgeon: Algernon Huxley, MD;  Location: [facility];  Service: Cardiovascular;  Laterality: Bilateral;   PUBOVAGINAL SLING     THROAT SURGERY     THYROID SURGERY     tumor around vocal cords    TOOTH EXTRACTION     winter 2018    TOTAL THYROIDECTOMY  1976   Family History  Problem Relation Age of Onset   Heart disease Mother    Diabetes Father    Cancer Daughter        breast ca x 2 s/p mastectomy    Social History   Socioeconomic History   Marital status: Widowed    Spouse name: Not on file   Number of children: 5   Years of education: Not on file   Highest education level: Not on file  Occupational History   Occupation: retired  Tobacco Use   Smoking status: Former    Packs/day: 0.50    Years: 20.00    Pack years: 10.00    Types: Cigarettes    Quit date: 09/20/1995    Years since quitting: 25.7   Smokeless tobacco: Never   Tobacco comments:    quit 1996 smoked 20 years max 8 cig qd   Vaping Use   Vaping Use: Never used  Substance and Sexual Activity   Alcohol use: No   Drug use: No   Sexual activity: Not on file  Other Topics Concern   Not on file  Social History Narrative   Lives at home with caretaker currently [name]    Has children    Likes to ride stationary bike and sew             Social Determinants of Health   Financial Resource Strain: Low Risk    Difficulty of Paying Living Expenses: Not hard at all  Food Insecurity: No Food Insecurity  Worried About Charity fundraiser in the Last Year: Never true   [place] in the Last Year: Never true  Transportation Needs: No Transportation Needs   Lack of Transportation (Medical): No    Lack of Transportation (Non-Medical): No  Physical Activity: Insufficiently Active   Days of Exercise per Week: 7 days   Minutes of Exercise per Session: 20 min  Stress: No Stress Concern Present   Feeling of Stress : Not at all  Social Connections: Not on file  Intimate Partner Violence: Not on file   Current Meds  Medication Sig   acetaminophen (TYLENOL) 325 MG tablet Take 2 tablets (650 mg total) by mouth every 6 (six) hours as needed.   albuterol (PROVENTIL) (2.5 MG/3ML) 0.083% nebulizer solution Take 3 mLs (2.5 mg total) by nebulization every 6 (six) hours as needed for wheezing or shortness of breath.   apixaban (ELIQUIS) 2.5 MG TABS tablet Take 1 tablet (2.5 mg total) by mouth 2 (two) times daily.   atorvastatin (LIPITOR) 40 MG tablet Take 1 tablet (40 mg total) by mouth daily at 6 PM. Generic ok   Azelaic Acid 15 % gel Apply 1 application topically 2 (two) times daily. After skin is thoroughly washed and patted dry, gently but thoroughly massage a thin film of azelaic acid cream into the affected area twice daily, in the morning and evening.   BISACODYL PO Take by mouth.   Calcium Carbonate-Vitamin D (CALCIUM 600+D) 600-400 MG-UNIT tablet Take 1 tablet by mouth 2 (two) times daily. Lunch and dinner   cetirizine (ZYRTEC) 10 MG tablet Take 1 tablet (10 mg total) by mouth daily.   clotrimazole-betamethasone (LOTRISONE) lotion SMARTSIG:In Ear(s)   Docusate Sodium (DOK PO) Take by mouth.   Fluticasone-Umeclidin-Vilant (TRELEGY ELLIPTA) 100-62.5-25 MCG/INH AEPB Inhale 1 puff into the lungs daily. Rinse mouth d/c symbicort and spiriva   gabapentin (NEURONTIN) 400 MG capsule Take 1 capsule (400 mg total) by mouth 3 (three) times daily.   guaiFENesin (MUCINEX) 600 MG 12 hr tablet Take 2 tablets (1,200 mg total) by mouth 2 (two) times daily as needed for cough or to loosen phlegm.   hydrocortisone 2.5 % cream Apply topically 2 (two) times daily.   levothyroxine (SYNTHROID) 200 MCG tablet Take  1 tablet (200 mcg total) by mouth daily before breakfast. Except on Sunday. Do not take with other medications or vitamins   Melatonin 10 MG TABS Take 10 mg by mouth at bedtime.   mirtazapine (REMERON) 15 MG tablet Take 1 tablet (15 mg total) by mouth at bedtime.   Multiple Vitamin (MULTIVITAMIN WITH MINERALS) TABS tablet Take 1 tablet by mouth daily.    mupirocin ointment (BACTROBAN) 2 % Apply 1 application topically 3 (three) times daily. Right thumb   pantoprazole (PROTONIX) 40 MG tablet Take 1 tablet (40 mg total) by mouth daily. 30 minutes before lunch or dinner   polyethylene glycol powder (GLYCOLAX/MIRALAX) 17 GM/SCOOP powder Take 17 g by mouth daily.   polyvinyl alcohol (LIQUIFILM TEARS) 1.4 % ophthalmic solution Place 1 drop into both eyes as needed for dry eyes.   [DISCONTINUED] acetaminophen-codeine (TYLENOL #3) 300-30 MG tablet Take 1 tablet by mouth 2 (two) times daily as needed for moderate pain.   [DISCONTINUED] furosemide (LASIX) 40 MG tablet Take 1 tablet (40 mg total) by mouth daily.   Allergies  Allergen Reactions   Penicillins Shortness Of Breath, Rash and Other (See Comments)    Has patient had a PCN reaction causing immediate  rash, facial/tongue/throat swelling, SOB or lightheadedness with hypotension: Yes Has patient had a PCN reaction causing severe rash involving mucus membranes or skin necrosis: No Has patient had a PCN reaction that required hospitalization No Has patient had a PCN reaction occurring within the last 10 years: No If all of the above answers are "NO", then may proceed with Cephalosporin use.   Sulfa Antibiotics Shortness Of Breath, Rash and Other (See Comments)   Amitiza [Lubiprostone]     N/v/d   Aspirin Other (See Comments)    Reaction:  Unknown  Other reaction(s): Bleeding (intolerance) Per patient " causes nose to bleed" Can take 81 mg daily without any complications Other reaction(s): "bloody nose"    Penicillin G Other (See Comments)     Has patient had a PCN reaction causing immediate rash, facial/tongue/throat swelling, SOB or lightheadedness with hypotension: No Has patient had a PCN reaction causing severe rash involving mucus membranes or skin necrosis: Unknown Has patient had a PCN reaction that required hospitalization: Unknown Has patient had a PCN reaction occurring within the last 10 years: Unknown If all of the above answers are "NO", then may proceed with Cephalosporin use.   Recent Results (from the past 2160 hour(s))  CBC with Differential     Status: None   Collection Time: 04/26/21  6:48 PM  Result Value Ref Range   WBC 7.4 4.0 - 10.5 K/uL   RBC 4.59 3.87 - 5.11 MIL/uL   Hemoglobin 13.8 12.0 - 15.0 g/dL   HCT 43.1 36.0 - 46.0 %   MCV 93.9 80.0 - 100.0 fL   MCH 30.1 26.0 - 34.0 pg   MCHC 32.0 30.0 - 36.0 g/dL   RDW 14.5 11.5 - 15.5 %   Platelets 184 150 - 400 K/uL   nRBC 0.0 0.0 - 0.2 %   Neutrophils Relative % 61 %   Neutro Abs 4.5 1.7 - 7.7 K/uL   Lymphocytes Relative 25 %   Lymphs Abs 1.8 0.7 - 4.0 K/uL   Monocytes Relative 10 %   Monocytes Absolute 0.7 0.1 - 1.0 K/uL   Eosinophils Relative 3 %   Eosinophils Absolute 0.2 0.0 - 0.5 K/uL   Basophils Relative 1 %   Basophils Absolute 0.0 0.0 - 0.1 K/uL   Immature Granulocytes 0 %   Abs Immature Granulocytes 0.03 0.00 - 0.07 K/uL    Comment: Performed at [facility], [address] [place], [place] [address]  Comprehensive metabolic panel     Status: Abnormal   Collection Time: 04/26/21  6:48 PM  Result Value Ref Range   Sodium 140 135 - 145 mmol/L   Potassium 3.5 3.5 - 5.1 mmol/L   Chloride 98 98 - 111 mmol/L   CO2 31 22 - 32 mmol/L   Glucose, Bld 99 70 - 99 mg/dL    Comment: Glucose reference range applies only to samples taken after fasting for at least 8 hours.   BUN 19 8 - 23 mg/dL   Creatinine, Ser 1.24 (H) 0.44 - 1.00 mg/dL   Calcium 8.5 (L) 8.9 - 10.3 mg/dL   Total Protein 7.1 6.5 - 8.1 g/dL   Albumin 3.7 3.5 - 5.0 g/dL    AST 25 15 - 41 U/L   ALT 18 0 - 44 U/L   Alkaline Phosphatase 58 38 - 126 U/L   Total Bilirubin 0.9 0.3 - 1.2 mg/dL   GFR, Estimated 41 (L) >60 mL/min    Comment: (NOTE) Calculated using the CKD-EPI  Creatinine Equation (2021)    Anion gap 11 5 - 15    Comment: Performed at [facility], [address] [place], [place] [address]  Brain natriuretic peptide     Status: None   Collection Time: 04/26/21  6:48 PM  Result Value Ref Range   B Natriuretic Peptide 69.6 0.0 - 100.0 pg/mL    Comment: Performed at [facility], [place], [place] [address]  Troponin I (High Sensitivity)     Status: None   Collection Time: 04/26/21  6:48 PM  Result Value Ref Range   Troponin I (High Sensitivity) 8 <18 ng/L    Comment: (NOTE) Elevated high sensitivity troponin I (hsTnI) values and significant  changes across serial measurements may suggest ACS but many other  chronic and acute conditions are known to elevate hsTnI results.  Refer to the "Links" section for chest pain algorithms and additional  guidance. Performed at [facility], [place], [place] [address]   Resp Panel by RT-PCR (Flu A&B, Covid) Nasopharyngeal Swab     Status: None   Collection Time: 04/26/21 10:38 PM   Specimen: Nasopharyngeal Swab; Nasopharyngeal(NP) swabs in vial transport medium  Result Value Ref Range   SARS Coronavirus 2 by RT PCR NEGATIVE NEGATIVE    Comment: (NOTE) SARS-CoV-2 target nucleic acids are NOT DETECTED.  The SARS-CoV-2 RNA is generally detectable in upper respiratory specimens during the acute phase of infection. The lowest concentration of SARS-CoV-2 viral copies this assay can detect is 138 copies/mL. A negative result does not preclude SARS-Cov-2 infection and should not be used as the sole basis for treatment or other patient management decisions. A negative result may occur with  improper specimen collection/handling, submission of  specimen other than nasopharyngeal swab, presence of viral mutation(s) within the areas targeted by this assay, and inadequate number of viral copies(<138 copies/mL). A negative result must be combined with clinical observations, patient history, and epidemiological information. The expected result is Negative.  Fact Sheet for Patients:  EntrepreneurPulse.com.au  Fact Sheet for Healthcare Providers:  IncredibleEmployment.be  This test is no t yet approved or cleared by the [place] FDA and  has been authorized for detection and/or diagnosis of SARS-CoV-2 by FDA under an Emergency Use Authorization (EUA). This EUA will remain  in effect (meaning this test can be used) for the duration of the COVID-19 declaration under Section 564(b)(1) of the Act, 21 U.S.C.section 360bbb-3(b)(1), unless the authorization is terminated  or revoked sooner.       Influenza A by PCR NEGATIVE NEGATIVE   Influenza B by PCR NEGATIVE NEGATIVE    Comment: (NOTE) The Xpert Xpress SARS-CoV-2/FLU/RSV plus assay is intended as an aid in the diagnosis of influenza from Nasopharyngeal swab specimens and should not be used as a sole basis for treatment. Nasal washings and aspirates are unacceptable for Xpert Xpress SARS-CoV-2/FLU/RSV testing.  Fact Sheet for Patients: EntrepreneurPulse.com.au  Fact Sheet for Healthcare Providers: IncredibleEmployment.be  This test is not yet approved or cleared by the [place] FDA and has been authorized for detection and/or diagnosis of SARS-CoV-2 by FDA under an Emergency Use Authorization (EUA). This EUA will remain in effect (meaning this test can be used) for the duration of the COVID-19 declaration under Section 564(b)(1) of the Act, 21 U.S.C. section 360bbb-3(b)(1), unless the authorization is terminated or revoked.  Performed at [facility], [address],  [place], [place] [address]   ECHOCARDIOGRAM COMPLETE  Status: None   Collection Time: 05/27/21 11:36 AM  Result Value Ref Range   Ao pk vel 1.94 m/s   AV Area VTI 2.26 cm2   AR max vel 2.46 cm2   AV Mean grad 8.0 mmHg   AV Peak grad 15.1 mmHg   S' Lateral 2.30 cm   AV Area mean vel 2.26 cm2   Area-P 1/2 2.34 cm2   MV VTI 2.70 cm2  Urinalysis, Complete w Microscopic Urine, Clean Catch     Status: Abnormal   Collection Time: 06/08/21  4:00 PM  Result Value Ref Range   Color, Urine YELLOW YELLOW   APPearance CLEAR CLEAR   Specific Gravity, Urine 1.015 1.005 - 1.030   pH 7.0 5.0 - 8.0   Glucose, UA NEGATIVE NEGATIVE mg/dL   Hgb urine dipstick NEGATIVE NEGATIVE   Bilirubin Urine NEGATIVE NEGATIVE   Ketones, ur NEGATIVE NEGATIVE mg/dL   Protein, ur NEGATIVE NEGATIVE mg/dL   Nitrite NEGATIVE NEGATIVE   Leukocytes,Ua NEGATIVE NEGATIVE   Squamous Epithelial / LPF 0-5 0 - 5   WBC, UA 0-5 0 - 5 WBC/hpf   RBC / HPF 0-5 0 - 5 RBC/hpf   Bacteria, UA RARE (A) NONE SEEN    Comment: Performed at [facility], [address] [place], [place] [address]  SARS CORONAVIRUS 2 (TAT 6-24 HRS) Nasopharyngeal Nasopharyngeal Swab     Status: None   Collection Time: 06/08/21  5:35 PM   Specimen: Nasopharyngeal Swab  Result Value Ref Range   SARS Coronavirus 2 NEGATIVE NEGATIVE    Comment: (NOTE) SARS-CoV-2 target nucleic acids are NOT DETECTED.  The SARS-CoV-2 RNA is generally detectable in upper and lower respiratory specimens during the acute phase of infection. Negative results do not preclude SARS-CoV-2 infection, do not rule out co-infections with other pathogens, and should not be used as the sole basis for treatment or other patient management decisions. Negative results must be combined with clinical observations, patient history, and epidemiological information. The expected result is Negative.  Fact Sheet for Patients: SugarRoll.be  Fact  Sheet for Healthcare Providers: https://www.woods-mathews.com/  This test is not yet approved or cleared by the [place] FDA and  has been authorized for detection and/or diagnosis of SARS-CoV-2 by FDA under an Emergency Use Authorization (EUA). This EUA will remain  in effect (meaning this test can be used) for the duration of the COVID-19 declaration under Se ction 564(b)(1) of the Act, 21 U.S.C. section 360bbb-3(b)(1), unless the authorization is terminated or revoked sooner.  Performed at [facility], [place] [address], [place], [place] [address]   CBC With Diff/Platelet     Status: None   Collection Time: 06/09/21  9:07 AM  Result Value Ref Range   WBC 6.2 3.4 - 10.8 x10E3/uL   RBC 4.58 3.77 - 5.28 x10E6/uL   Hemoglobin 13.8 11.1 - 15.9 g/dL   Hematocrit 41.3 34.0 - 46.6 %   MCV 90 79 - 97 fL   MCH 30.1 26.6 - 33.0 pg   MCHC 33.4 31.5 - 35.7 g/dL   RDW 12.9 11.7 - 15.4 %   Platelets 185 150 - 450 x10E3/uL   Neutrophils 59 Not Estab. %   Lymphs 29 Not Estab. %   Monocytes 8 Not Estab. %   Eos 4 Not Estab. %   Basos 0 Not Estab. %   Neutrophils Absolute 3.6 1.4 - 7.0 x10E3/uL   Lymphocytes Absolute 1.8 0.7 - 3.1 x10E3/uL   Monocytes  Absolute 0.5 0.1 - 0.9 x10E3/uL   EOS (ABSOLUTE) 0.3 0.0 - 0.4 x10E3/uL   Basophils Absolute 0.0 0.0 - 0.2 x10E3/uL   Immature Granulocytes 0 Not Estab. %   Immature Grans (Abs) 0.0 0.0 - 0.1 x10E3/uL  Comprehensive metabolic panel     Status: Abnormal   Collection Time: 06/09/21  9:07 AM  Result Value Ref Range   Glucose 141 (H) 65 - 99 mg/dL   BUN 19 10 - 36 mg/dL   Creatinine, Ser 1.50 (H) 0.57 - 1.00 mg/dL   eGFR 32 (L) >59 mL/min/1.73   BUN/Creatinine Ratio 13 12 - 28   Sodium 143 134 - 144 mmol/L   Potassium 4.4 3.5 - 5.2 mmol/L   Chloride 100 96 - 106 mmol/L   CO2 30 (H) 20 - 29 mmol/L   Calcium 9.3 8.7 - 10.3 mg/dL   Total Protein 6.2 6.0 - 8.5 g/dL   Albumin 3.7 3.5 - 4.6 g/dL   Globulin, Total 2.5 1.5 -  4.5 g/dL   Albumin/Globulin Ratio 1.5 1.2 - 2.2   Bilirubin Total 0.3 0.0 - 1.2 mg/dL   Alkaline Phosphatase 74 44 - 121 IU/L   AST 35 0 - 40 IU/L   ALT 32 0 - 32 IU/L   Objective  Body mass index is 32.11 kg/m. Wt Readings from Last 3 Encounters:  06/28/21 205 lb (93 kg)  06/08/21 199 lb 15.3 oz (90.7 kg)  05/09/21 200 lb (90.7 kg)   Temp Readings from Last 3 Encounters:  06/28/21 (!) 97.3 F (36.3 C) (Temporal)  06/08/21 98.2 F (36.8 C) (Oral)  05/05/21 (!) 97.2 F (36.2 C) (Temporal)   BP Readings from Last 3 Encounters:  06/28/21 110/72  06/08/21 137/71  05/09/21 129/68   Pulse Readings from Last 3 Encounters:  06/28/21 75  06/08/21 64  05/09/21 65    Physical Exam Vitals and nursing note reviewed.  Constitutional:      Appearance: Normal appearance. She is well-developed and well-groomed. She is obese.  HENT:     Head: Normocephalic and atraumatic.  Eyes:     Conjunctiva/sclera: Conjunctivae normal.     Pupils: Pupils are equal, round, and reactive to light.  Cardiovascular:     Rate and Rhythm: Normal rate and regular rhythm.     Heart sounds: Normal heart sounds. No murmur heard. Pulmonary:     Effort: Pulmonary effort is normal.     Breath sounds: Wheezing present.  Skin:    General: Skin is warm and dry.  Neurological:     General: No focal deficit present.     Mental Status: She is alert and oriented to person, place, and time. Mental status is at baseline.     Gait: Gait normal.     Comments: Uses rollator issues with mobility today   Psychiatric:        Attention and Perception: Attention and perception normal.        Mood and Affect: Mood and affect normal.        Speech: Speech normal.        Behavior: Behavior normal. Behavior is cooperative.        Thought Content: Thought content normal.        Cognition and Memory: Cognition and memory normal.        Judgment: Judgment normal.    Assessment  Plan  Left groin pain - Plan: DG Hip  Unilat W OR W/O Pelvis 2-3 Views Left, DG Lumbar Spine Complete,  acetaminophen-codeine (TYLENOL #3) 300-30 MG tablet Acute low back pain, unspecified back pain laterality, unspecified whether sciatica present - Plan: DG Lumbar Spine Complete, acetaminophen-codeine (TYLENOL #3) 300-30 MG tablet Home pt/ot  Left hip pain - Plan: DG Lumbar Spine Complete, acetaminophen-codeine (TYLENOL #3) 300-30 MG tablet  Trigeminal neuralgia - Plan: acetaminophen-codeine (TYLENOL #3) 300-30 MG tablet  Lymphedema stage 3 - Plan: furosemide (LASIX) 40 MG tablet qd to bid  Dr. Delana Meyer    Normally lymph pumps 1 hour 3 times a day rec with a relative contraindication for congestive heart failure.  For a person without a big history of congestive heart failure I stress 1 to 2 hours in the evening (typically after supper when everybody is sitting around watching TV anyway).   For someone it has fairly significant congestive heart failure I usually start with a 30-minute session perhaps twice a day and see how that is tolerated.  Ultimately if they are fragile may be 30 minutes in the morning and then an additional 45 minutes to an hour in the evening but it is a little bit of trial and error to see what will minimize their edema and prevent them from going into heart failure.  Sometimes I wondered if we should try to time the longer.    Stage 3b chronic kidney disease (HCC) F/u renal   Prediabetes off meds   Constipation, unspecified constipation type senna s with colace or colace alone  Chronic obstructive pulmonary disease, unspecified COPD type ([place]) on 3 L rec 2.5 to 3L continuous  Pending [facility] consult  Given most form again today  Bronchiectasis without complication ([place])  F/u pulm  Flutter valve  Monitor O2   HM Flu utd prevnar had 09/20/16 pna 23 08/29/18 Tdap 08/04/16 shingrix not had out of stock called pharmacy, ? zostavax if had  MMR immune covid 3/3 pfizer per daughter had 4th dose will  call back with date    Est dermatology  Dr. Evorn Gong Out of age window pap, colonoscopy, mammo  DEXA 01/01/08 osteopenia will make sure on ca 600 bid and vit D 1000 iu qd -Vitamin d 03/15/18 48.1  -disc with patient and family at f/u if want to pursue in future DEXA   Est vascular AVV Est dermatology Dr. Evorn Gong  Est renal [facility] [place] [facility] consult pending   Provider: Dr. Olivia Mackie McLean-[facility]oc[facility]-Internal Medicine

## 2021-06-29 DIAGNOSIS — R7303 Prediabetes: Secondary | ICD-10-CM | POA: Insufficient documentation

## 2021-07-04 ENCOUNTER — Encounter: Payer: Self-pay | Admitting: Internal Medicine

## 2021-07-06 DIAGNOSIS — I890 Lymphedema, not elsewhere classified: Secondary | ICD-10-CM | POA: Diagnosis not present

## 2021-07-06 DIAGNOSIS — E1122 Type 2 diabetes mellitus with diabetic chronic kidney disease: Secondary | ICD-10-CM | POA: Diagnosis not present

## 2021-07-06 DIAGNOSIS — J45909 Unspecified asthma, uncomplicated: Secondary | ICD-10-CM | POA: Diagnosis not present

## 2021-07-06 DIAGNOSIS — I129 Hypertensive chronic kidney disease with stage 1 through stage 4 chronic kidney disease, or unspecified chronic kidney disease: Secondary | ICD-10-CM | POA: Diagnosis not present

## 2021-07-06 DIAGNOSIS — J449 Chronic obstructive pulmonary disease, unspecified: Secondary | ICD-10-CM | POA: Diagnosis not present

## 2021-07-06 DIAGNOSIS — N1832 Chronic kidney disease, stage 3b: Secondary | ICD-10-CM | POA: Diagnosis not present

## 2021-07-06 DIAGNOSIS — I2510 Atherosclerotic heart disease of native coronary artery without angina pectoris: Secondary | ICD-10-CM | POA: Diagnosis not present

## 2021-07-06 DIAGNOSIS — G500 Trigeminal neuralgia: Secondary | ICD-10-CM | POA: Diagnosis not present

## 2021-07-06 DIAGNOSIS — I272 Pulmonary hypertension, unspecified: Secondary | ICD-10-CM | POA: Diagnosis not present

## 2021-07-06 DIAGNOSIS — E1140 Type 2 diabetes mellitus with diabetic neuropathy, unspecified: Secondary | ICD-10-CM | POA: Diagnosis not present

## 2021-07-11 ENCOUNTER — Telehealth: Payer: Self-pay | Admitting: Internal Medicine

## 2021-07-11 NOTE — Telephone Encounter (Signed)
Whether or not pt needs O2 therapy at home and how much oxygen for continuous of as needed basis  Decisions like this should come from pulmonary doctor  They need to discuss this with pulmonary

## 2021-07-11 NOTE — Telephone Encounter (Signed)
Daughter called stating that she will be taking her mother to the pulmonary specialist on Thursday and do not come pick up the oxygen tank.

## 2021-07-11 NOTE — Telephone Encounter (Signed)
Patient called and wants Dr Olivia Mackie to cancel her oxygen.

## 2021-07-12 NOTE — Telephone Encounter (Signed)
Noted, as stated below Patient will be seeing pulmonary on Thursday

## 2021-07-13 DIAGNOSIS — N1832 Chronic kidney disease, stage 3b: Secondary | ICD-10-CM | POA: Diagnosis not present

## 2021-07-13 DIAGNOSIS — E1140 Type 2 diabetes mellitus with diabetic neuropathy, unspecified: Secondary | ICD-10-CM | POA: Diagnosis not present

## 2021-07-13 DIAGNOSIS — I129 Hypertensive chronic kidney disease with stage 1 through stage 4 chronic kidney disease, or unspecified chronic kidney disease: Secondary | ICD-10-CM | POA: Diagnosis not present

## 2021-07-13 DIAGNOSIS — G500 Trigeminal neuralgia: Secondary | ICD-10-CM | POA: Diagnosis not present

## 2021-07-13 DIAGNOSIS — I272 Pulmonary hypertension, unspecified: Secondary | ICD-10-CM | POA: Diagnosis not present

## 2021-07-13 DIAGNOSIS — I890 Lymphedema, not elsewhere classified: Secondary | ICD-10-CM | POA: Diagnosis not present

## 2021-07-13 DIAGNOSIS — I2510 Atherosclerotic heart disease of native coronary artery without angina pectoris: Secondary | ICD-10-CM | POA: Diagnosis not present

## 2021-07-13 DIAGNOSIS — E1122 Type 2 diabetes mellitus with diabetic chronic kidney disease: Secondary | ICD-10-CM | POA: Diagnosis not present

## 2021-07-13 DIAGNOSIS — J45909 Unspecified asthma, uncomplicated: Secondary | ICD-10-CM | POA: Diagnosis not present

## 2021-07-14 ENCOUNTER — Encounter: Payer: Self-pay | Admitting: Pulmonary Disease

## 2021-07-14 ENCOUNTER — Ambulatory Visit (INDEPENDENT_AMBULATORY_CARE_PROVIDER_SITE_OTHER): Payer: Medicare PPO | Admitting: Pulmonary Disease

## 2021-07-14 ENCOUNTER — Other Ambulatory Visit: Payer: Self-pay

## 2021-07-14 VITALS — BP 130/80 | HR 65 | Temp 97.3°F | Ht 67.0 in | Wt 203.6 lb

## 2021-07-14 DIAGNOSIS — N1832 Chronic kidney disease, stage 3b: Secondary | ICD-10-CM | POA: Diagnosis not present

## 2021-07-14 DIAGNOSIS — J479 Bronchiectasis, uncomplicated: Secondary | ICD-10-CM

## 2021-07-14 DIAGNOSIS — J45909 Unspecified asthma, uncomplicated: Secondary | ICD-10-CM | POA: Diagnosis not present

## 2021-07-14 DIAGNOSIS — I129 Hypertensive chronic kidney disease with stage 1 through stage 4 chronic kidney disease, or unspecified chronic kidney disease: Secondary | ICD-10-CM | POA: Diagnosis not present

## 2021-07-14 DIAGNOSIS — J9611 Chronic respiratory failure with hypoxia: Secondary | ICD-10-CM | POA: Diagnosis not present

## 2021-07-14 DIAGNOSIS — I2510 Atherosclerotic heart disease of native coronary artery without angina pectoris: Secondary | ICD-10-CM | POA: Diagnosis not present

## 2021-07-14 DIAGNOSIS — I272 Pulmonary hypertension, unspecified: Secondary | ICD-10-CM | POA: Diagnosis not present

## 2021-07-14 DIAGNOSIS — G500 Trigeminal neuralgia: Secondary | ICD-10-CM | POA: Diagnosis not present

## 2021-07-14 DIAGNOSIS — E1122 Type 2 diabetes mellitus with diabetic chronic kidney disease: Secondary | ICD-10-CM | POA: Diagnosis not present

## 2021-07-14 DIAGNOSIS — J449 Chronic obstructive pulmonary disease, unspecified: Secondary | ICD-10-CM | POA: Diagnosis not present

## 2021-07-14 DIAGNOSIS — I890 Lymphedema, not elsewhere classified: Secondary | ICD-10-CM | POA: Diagnosis not present

## 2021-07-14 DIAGNOSIS — E1140 Type 2 diabetes mellitus with diabetic neuropathy, unspecified: Secondary | ICD-10-CM | POA: Diagnosis not present

## 2021-07-14 NOTE — Progress Notes (Signed)
[facility]  Chief Complaint  Patient presents with   Follow-up    Patient's daughter states she wants to stop O2 but they want [place] to relay the importance of it to her and talk about possibly switching nebulizer medications.     Constitutional:  BP 130/80 (BP Location: Left Arm, Patient Position: Sitting, Cuff Size: Normal)   Pulse 65   Temp (!) 97.3 F (36.3 C) (Temporal)   Ht 5\' 7"  (1.702 m)   Wt 203 lb 9.6 oz (92.4 kg)   LMP  (LMP Unknown)   SpO2 92%   BMI 31.89 kg/m   Past Medical History:  Acoustic neuroma, Allergies, CAD, Cataract, CVA, Depression, DM, Neuropathy, DVT s/p IVC filter, Fatty liver, GERD, Hiatal hernia, HLD, HTN, Hypothyroidism, OA, Osteoporosis, PE, Recurrent UTI, Trigeminal neuralgia  Past Surgical History:  She  has a past surgical history that includes brain tumor surgery; Throat surgery; Cholecystectomy; Cardiac catheterization (N/A, 10/11/2015); Thyroid surgery; IVC FILTER PLACEMENT ([place] HX); Brain surgery; Tooth extraction; Eye surgery; Breast surgery; Pubovaginal sling; Mohs surgery; Appendectomy; Laparoscopic tubal ligation; Total thyroidectomy (1976); Cataract extraction; and PERIPHERAL VASCULAR THROMBECTOMY (Bilateral, 03/29/2018).  Brief Summary:  Jessica Erickson is a 87 y.o. female former smoker with bronchiectasis.      Subjective:   She is here with her daughter.  She has very poor hearing.  Interview conducted by me typing into Microsoft word in large font, and she would read this and then speak back to me.  She recently moved in with her son.  She hasn't been using supplemental oxygen consistently.  She wants to get rid of it if she can.  She maintained SpO2 > 92% on room air while walking in office today.  She has intermittent wheeze.  This happens when she gets chest congestion and more cough.  She hasn't been consistent with using flutter valve, and hasn't been using mucinex.  She feels that  trelegy is easy to use.  She uses albuterol nebulizer sometimes, but her daughter says she will often times fall asleep while using this.  She uses a stationary bike and tries to exercise on daily basis.   Physical Exam:   Appearance - well kempt, poor hearing acuity  ENMT - no sinus tenderness, no oral exudate, no LAN, Mallampati 2 airway, no stridor, raspy voice  Respiratory - bilateral rhonchi with inspiratory squeaks that clear with coughing  CV - s1s2 regular rate and rhythm, no murmurs  Ext - no clubbing, no edema  Skin - no rashes  Psych - normal mood and affect    Pulmonary testing:    Chest Imaging:  CT chest 05/04/21 >> scarring with BTX RUL, ATX in lingula, bronchial wall thickening  Sleep Tests:    Cardiac Tests:  Echo 10/09/15 >> EF 55 to 60%, grade 1 DD  Social History:  She  reports that she quit smoking about 25 years ago. Her smoking use included cigarettes. She has a 10.00 pack-year smoking history. She has never used smokeless tobacco. She reports that she does not drink alcohol and does not use drugs.  Family History:  Her family history includes Cancer in her daughter; Diabetes in her father; Heart disease in her mother.     Assessment/Plan:   Bronchiectasis with COPD. - okay to continue trelegy; if she has more difficulty using inhaler, then could transition to ICS/LAMA/LABA by nebulizer - discussed importance of maintaining bronchial hygiene regimen - continue mucinex and flutter valve  bid prn - she can use albuterol nebulizer prn  - I don't think she would be able to tolerate chest vest  Chronic respiratory failure with hypoxia. - she maintained her SpO2 > 92% on room air at rest and with exertion today in office - will arrange for overnight oximetry on room air to determine if she can have home oxygen set up discontinued - in the meantime she should continue 2 liters oxygen at night  Time Spent Involved in Patient Care on Day of  Examination:  35 minutes  Follow up:   Patient Instructions  Will arrange for overnight oxygen test on room air  Using mucinex 1200 mg twice per day as needed to help with chest congestion and cough  Use flutter valve twice per day as needed to help with chest congestion and cough  Follow up in 3 to 4 months  Medication List:   Allergies as of 07/14/2021       Reactions   Penicillins Shortness Of Breath, Rash, Other (See Comments)   Has patient had a PCN reaction causing immediate rash, facial/tongue/throat swelling, SOB or lightheadedness with hypotension: Yes Has patient had a PCN reaction causing severe rash involving mucus membranes or skin necrosis: No Has patient had a PCN reaction that required hospitalization No Has patient had a PCN reaction occurring within the last 10 years: No If all of the above answers are "NO", then may proceed with Cephalosporin use.   Sulfa Antibiotics Shortness Of Breath, Rash, Other (See Comments)   Amitiza [lubiprostone]    N/v/d   Aspirin Other (See Comments)   Reaction:  Unknown  Other reaction(s): Bleeding (intolerance) Per patient " causes nose to bleed" Can take 81 mg daily without any complications Other reaction(s): "bloody nose"   Penicillin G Other (See Comments)   Has patient had a PCN reaction causing immediate rash, facial/tongue/throat swelling, SOB or lightheadedness with hypotension: No Has patient had a PCN reaction causing severe rash involving mucus membranes or skin necrosis: Unknown Has patient had a PCN reaction that required hospitalization: Unknown Has patient had a PCN reaction occurring within the last 10 years: Unknown If all of the above answers are "NO", then may proceed with Cephalosporin use.        Medication List        Accurate as of July 14, 2021  1:07 PM. If you have any questions, ask your nurse or doctor.          acetaminophen 325 MG tablet Commonly known as: TYLENOL Take 2 tablets (650  mg total) by mouth every 6 (six) hours as needed.   acetaminophen-codeine 300-30 MG tablet Commonly known as: TYLENOL #3 Take 1 tablet by mouth 2 (two) times daily as needed for moderate pain.   albuterol (2.5 MG/3ML) 0.083% nebulizer solution Commonly known as: PROVENTIL Take 3 mLs (2.5 mg total) by nebulization every 6 (six) hours as needed for wheezing or shortness of breath.   apixaban 2.5 MG Tabs tablet Commonly known as: Eliquis Take 1 tablet (2.5 mg total) by mouth 2 (two) times daily.   atorvastatin 40 MG tablet Commonly known as: LIPITOR Take 1 tablet (40 mg total) by mouth daily at 6 PM. Generic ok   Azelaic Acid 15 % gel Apply 1 application topically 2 (two) times daily. After skin is thoroughly washed and patted dry, gently but thoroughly massage a thin film of azelaic acid cream into the affected area twice daily, in the morning and evening.  BISACODYL PO Take by mouth.   Calcium Carbonate-Vitamin D 600-400 MG-UNIT tablet Commonly known as: Calcium 600+D Take 1 tablet by mouth 2 (two) times daily. Lunch and dinner   cetirizine 10 MG tablet Commonly known as: ZYRTEC Take 1 tablet (10 mg total) by mouth daily.   clotrimazole-betamethasone lotion Commonly known as: LOTRISONE SMARTSIG:In Ear(s)   DOK PO Take by mouth.   furosemide 40 MG tablet Commonly known as: Lasix Qd and Take 2nd dose at lunch 40 mg if weight increased 4 to 5 lbs each day   gabapentin 400 MG capsule Commonly known as: NEURONTIN Take 1 capsule (400 mg total) by mouth 3 (three) times daily.   guaiFENesin 600 MG 12 hr tablet Commonly known as: MUCINEX Take 2 tablets (1,200 mg total) by mouth 2 (two) times daily as needed for cough or to loosen phlegm.   hydrocortisone 2.5 % cream Apply topically 2 (two) times daily.   levothyroxine 200 MCG tablet Commonly known as: SYNTHROID Take 1 tablet (200 mcg total) by mouth daily before breakfast. Except on Sunday. Do not take with other  medications or vitamins   Melatonin 10 MG Tabs Take 10 mg by mouth at bedtime.   mirtazapine 15 MG tablet Commonly known as: Remeron Take 1 tablet (15 mg total) by mouth at bedtime.   multivitamin with minerals Tabs tablet Take 1 tablet by mouth daily.   mupirocin ointment 2 % Commonly known as: BACTROBAN Apply 1 application topically 3 (three) times daily. Right thumb   pantoprazole 40 MG tablet Commonly known as: PROTONIX Take 1 tablet (40 mg total) by mouth daily. 30 minutes before lunch or dinner   polyethylene glycol powder 17 GM/SCOOP powder Commonly known as: GLYCOLAX/MIRALAX Take 17 g by mouth daily.   polyvinyl alcohol 1.4 % ophthalmic solution Commonly known as: LIQUIFILM TEARS Place 1 drop into both eyes as needed for dry eyes.   Trelegy Ellipta 100-62.5-25 MCG/INH Aepb Generic drug: Fluticasone-Umeclidin-Vilant Inhale 1 puff into the lungs daily. Rinse mouth d/c symbicort and spiriva        Signature:  Chesley Mires, MD [place] Pager - [phone] 07/14/2021, 1:07 PM

## 2021-07-14 NOTE — Patient Instructions (Signed)
Will arrange for overnight oxygen test on room air  Using mucinex 1200 mg twice per day as needed to help with chest congestion and cough  Use flutter valve twice per day as needed to help with chest congestion and cough  Follow up in 3 to 4 months

## 2021-07-15 DIAGNOSIS — G500 Trigeminal neuralgia: Secondary | ICD-10-CM | POA: Diagnosis not present

## 2021-07-15 DIAGNOSIS — J45909 Unspecified asthma, uncomplicated: Secondary | ICD-10-CM | POA: Diagnosis not present

## 2021-07-15 DIAGNOSIS — I129 Hypertensive chronic kidney disease with stage 1 through stage 4 chronic kidney disease, or unspecified chronic kidney disease: Secondary | ICD-10-CM | POA: Diagnosis not present

## 2021-07-15 DIAGNOSIS — I272 Pulmonary hypertension, unspecified: Secondary | ICD-10-CM | POA: Diagnosis not present

## 2021-07-15 DIAGNOSIS — E1122 Type 2 diabetes mellitus with diabetic chronic kidney disease: Secondary | ICD-10-CM | POA: Diagnosis not present

## 2021-07-15 DIAGNOSIS — I890 Lymphedema, not elsewhere classified: Secondary | ICD-10-CM | POA: Diagnosis not present

## 2021-07-15 DIAGNOSIS — E1140 Type 2 diabetes mellitus with diabetic neuropathy, unspecified: Secondary | ICD-10-CM | POA: Diagnosis not present

## 2021-07-15 DIAGNOSIS — I2510 Atherosclerotic heart disease of native coronary artery without angina pectoris: Secondary | ICD-10-CM | POA: Diagnosis not present

## 2021-07-15 DIAGNOSIS — N1832 Chronic kidney disease, stage 3b: Secondary | ICD-10-CM | POA: Diagnosis not present

## 2021-07-18 DIAGNOSIS — G500 Trigeminal neuralgia: Secondary | ICD-10-CM | POA: Diagnosis not present

## 2021-07-18 DIAGNOSIS — I890 Lymphedema, not elsewhere classified: Secondary | ICD-10-CM | POA: Diagnosis not present

## 2021-07-18 DIAGNOSIS — J45909 Unspecified asthma, uncomplicated: Secondary | ICD-10-CM | POA: Diagnosis not present

## 2021-07-18 DIAGNOSIS — E1122 Type 2 diabetes mellitus with diabetic chronic kidney disease: Secondary | ICD-10-CM | POA: Diagnosis not present

## 2021-07-18 DIAGNOSIS — N1832 Chronic kidney disease, stage 3b: Secondary | ICD-10-CM | POA: Diagnosis not present

## 2021-07-18 DIAGNOSIS — I129 Hypertensive chronic kidney disease with stage 1 through stage 4 chronic kidney disease, or unspecified chronic kidney disease: Secondary | ICD-10-CM | POA: Diagnosis not present

## 2021-07-18 DIAGNOSIS — E1140 Type 2 diabetes mellitus with diabetic neuropathy, unspecified: Secondary | ICD-10-CM | POA: Diagnosis not present

## 2021-07-18 DIAGNOSIS — I2510 Atherosclerotic heart disease of native coronary artery without angina pectoris: Secondary | ICD-10-CM | POA: Diagnosis not present

## 2021-07-18 DIAGNOSIS — I272 Pulmonary hypertension, unspecified: Secondary | ICD-10-CM | POA: Diagnosis not present

## 2021-07-19 DIAGNOSIS — J45909 Unspecified asthma, uncomplicated: Secondary | ICD-10-CM | POA: Diagnosis not present

## 2021-07-19 DIAGNOSIS — I129 Hypertensive chronic kidney disease with stage 1 through stage 4 chronic kidney disease, or unspecified chronic kidney disease: Secondary | ICD-10-CM | POA: Diagnosis not present

## 2021-07-19 DIAGNOSIS — I2510 Atherosclerotic heart disease of native coronary artery without angina pectoris: Secondary | ICD-10-CM | POA: Diagnosis not present

## 2021-07-19 DIAGNOSIS — I272 Pulmonary hypertension, unspecified: Secondary | ICD-10-CM | POA: Diagnosis not present

## 2021-07-19 DIAGNOSIS — N1832 Chronic kidney disease, stage 3b: Secondary | ICD-10-CM | POA: Diagnosis not present

## 2021-07-19 DIAGNOSIS — E1140 Type 2 diabetes mellitus with diabetic neuropathy, unspecified: Secondary | ICD-10-CM | POA: Diagnosis not present

## 2021-07-19 DIAGNOSIS — I890 Lymphedema, not elsewhere classified: Secondary | ICD-10-CM | POA: Diagnosis not present

## 2021-07-19 DIAGNOSIS — E1122 Type 2 diabetes mellitus with diabetic chronic kidney disease: Secondary | ICD-10-CM | POA: Diagnosis not present

## 2021-07-19 DIAGNOSIS — G500 Trigeminal neuralgia: Secondary | ICD-10-CM | POA: Diagnosis not present

## 2021-07-20 DIAGNOSIS — J449 Chronic obstructive pulmonary disease, unspecified: Secondary | ICD-10-CM | POA: Diagnosis not present

## 2021-07-21 DIAGNOSIS — I129 Hypertensive chronic kidney disease with stage 1 through stage 4 chronic kidney disease, or unspecified chronic kidney disease: Secondary | ICD-10-CM | POA: Diagnosis not present

## 2021-07-21 DIAGNOSIS — N1832 Chronic kidney disease, stage 3b: Secondary | ICD-10-CM | POA: Diagnosis not present

## 2021-07-21 DIAGNOSIS — E1122 Type 2 diabetes mellitus with diabetic chronic kidney disease: Secondary | ICD-10-CM | POA: Diagnosis not present

## 2021-07-21 DIAGNOSIS — I2510 Atherosclerotic heart disease of native coronary artery without angina pectoris: Secondary | ICD-10-CM | POA: Diagnosis not present

## 2021-07-21 DIAGNOSIS — I272 Pulmonary hypertension, unspecified: Secondary | ICD-10-CM | POA: Diagnosis not present

## 2021-07-21 DIAGNOSIS — E1140 Type 2 diabetes mellitus with diabetic neuropathy, unspecified: Secondary | ICD-10-CM | POA: Diagnosis not present

## 2021-07-21 DIAGNOSIS — I890 Lymphedema, not elsewhere classified: Secondary | ICD-10-CM | POA: Diagnosis not present

## 2021-07-21 DIAGNOSIS — G500 Trigeminal neuralgia: Secondary | ICD-10-CM | POA: Diagnosis not present

## 2021-07-21 DIAGNOSIS — J45909 Unspecified asthma, uncomplicated: Secondary | ICD-10-CM | POA: Diagnosis not present

## 2021-07-22 ENCOUNTER — Telehealth: Payer: Self-pay | Admitting: Pulmonary Disease

## 2021-07-22 NOTE — Telephone Encounter (Signed)
ONO with RA 07/19/21 >> test time 9 hrs 51 min.  Basal SpO2 88.1%, low SpO2 75%.  Spent 308.1 min with SpO2 < 88%.   Please let her know her oxygen level is still staying low at night.  She should continue using supplemental oxygen while asleep.

## 2021-07-25 NOTE — Telephone Encounter (Signed)
ATC patient on [phone], unable to leave voicemail due to mailbox being full. Will try again later

## 2021-07-26 ENCOUNTER — Telehealth: Payer: Self-pay

## 2021-07-26 DIAGNOSIS — I129 Hypertensive chronic kidney disease with stage 1 through stage 4 chronic kidney disease, or unspecified chronic kidney disease: Secondary | ICD-10-CM | POA: Diagnosis not present

## 2021-07-26 DIAGNOSIS — I272 Pulmonary hypertension, unspecified: Secondary | ICD-10-CM | POA: Diagnosis not present

## 2021-07-26 DIAGNOSIS — N1832 Chronic kidney disease, stage 3b: Secondary | ICD-10-CM | POA: Diagnosis not present

## 2021-07-26 DIAGNOSIS — J45909 Unspecified asthma, uncomplicated: Secondary | ICD-10-CM | POA: Diagnosis not present

## 2021-07-26 DIAGNOSIS — E1122 Type 2 diabetes mellitus with diabetic chronic kidney disease: Secondary | ICD-10-CM | POA: Diagnosis not present

## 2021-07-26 DIAGNOSIS — I2510 Atherosclerotic heart disease of native coronary artery without angina pectoris: Secondary | ICD-10-CM | POA: Diagnosis not present

## 2021-07-26 DIAGNOSIS — E1140 Type 2 diabetes mellitus with diabetic neuropathy, unspecified: Secondary | ICD-10-CM | POA: Diagnosis not present

## 2021-07-26 DIAGNOSIS — I890 Lymphedema, not elsewhere classified: Secondary | ICD-10-CM | POA: Diagnosis not present

## 2021-07-26 DIAGNOSIS — G500 Trigeminal neuralgia: Secondary | ICD-10-CM | POA: Diagnosis not present

## 2021-07-26 NOTE — Telephone Encounter (Signed)
[facility] OT orders have been signed and confirmed faxed. They have been sent to scan.

## 2021-07-26 NOTE — Telephone Encounter (Signed)
ATC patient to go over results as well as her son Jessica Erickson and was unsuccessful x2. Per protocol letter will be sent and this encounter will be closed. Nothing further needed at this time.

## 2021-07-27 DIAGNOSIS — I2510 Atherosclerotic heart disease of native coronary artery without angina pectoris: Secondary | ICD-10-CM | POA: Diagnosis not present

## 2021-07-27 DIAGNOSIS — E1122 Type 2 diabetes mellitus with diabetic chronic kidney disease: Secondary | ICD-10-CM | POA: Diagnosis not present

## 2021-07-27 DIAGNOSIS — E1140 Type 2 diabetes mellitus with diabetic neuropathy, unspecified: Secondary | ICD-10-CM | POA: Diagnosis not present

## 2021-07-27 DIAGNOSIS — N1832 Chronic kidney disease, stage 3b: Secondary | ICD-10-CM | POA: Diagnosis not present

## 2021-07-27 DIAGNOSIS — J45909 Unspecified asthma, uncomplicated: Secondary | ICD-10-CM | POA: Diagnosis not present

## 2021-07-27 DIAGNOSIS — I890 Lymphedema, not elsewhere classified: Secondary | ICD-10-CM | POA: Diagnosis not present

## 2021-07-27 DIAGNOSIS — G500 Trigeminal neuralgia: Secondary | ICD-10-CM | POA: Diagnosis not present

## 2021-07-27 DIAGNOSIS — I129 Hypertensive chronic kidney disease with stage 1 through stage 4 chronic kidney disease, or unspecified chronic kidney disease: Secondary | ICD-10-CM | POA: Diagnosis not present

## 2021-07-27 DIAGNOSIS — I272 Pulmonary hypertension, unspecified: Secondary | ICD-10-CM | POA: Diagnosis not present

## 2021-07-28 ENCOUNTER — Telehealth: Payer: Self-pay | Admitting: Pulmonary Disease

## 2021-07-28 ENCOUNTER — Encounter (INDEPENDENT_AMBULATORY_CARE_PROVIDER_SITE_OTHER): Payer: Self-pay

## 2021-07-28 NOTE — Telephone Encounter (Signed)
ONO with RA 07/19/21 >> test time 9 hrs 51 min.  Basal SpO2 88.1%, low SpO2 75%.  Spent 308.1 min with SpO2 < 88%.     Please let her know her oxygen level is still staying low at night.  She should continue using supplemental oxygen while asleep.  Patient's daughter, Nancy(DPR) is aware of results and voiced her understanding.  [name] stated that ONO was ordered with oxygen, however patient did not wear oxygen during test because she has facial nerve pain and wearing a nasal cannula worsens the pain.  Patient has not worn oxygen in weeks.   [name] is requesting an alternative or additional recommendations.   Dr. Halford Chessman, please advise. Thanks

## 2021-07-28 NOTE — Telephone Encounter (Signed)
She could try taping the nasal cannula around her nose and forehead so it doesn't contact areas that might irritate her facial nerves.

## 2021-07-28 NOTE — Telephone Encounter (Signed)
Patient's daughter, Nancy(DPR) is aware of below message and voiced her understanding.  Nothing further needed at this time.

## 2021-07-29 DIAGNOSIS — I129 Hypertensive chronic kidney disease with stage 1 through stage 4 chronic kidney disease, or unspecified chronic kidney disease: Secondary | ICD-10-CM | POA: Diagnosis not present

## 2021-07-29 DIAGNOSIS — E1140 Type 2 diabetes mellitus with diabetic neuropathy, unspecified: Secondary | ICD-10-CM | POA: Diagnosis not present

## 2021-07-29 DIAGNOSIS — E1122 Type 2 diabetes mellitus with diabetic chronic kidney disease: Secondary | ICD-10-CM | POA: Diagnosis not present

## 2021-07-29 DIAGNOSIS — J45909 Unspecified asthma, uncomplicated: Secondary | ICD-10-CM | POA: Diagnosis not present

## 2021-07-29 DIAGNOSIS — G500 Trigeminal neuralgia: Secondary | ICD-10-CM | POA: Diagnosis not present

## 2021-07-29 DIAGNOSIS — N1832 Chronic kidney disease, stage 3b: Secondary | ICD-10-CM | POA: Diagnosis not present

## 2021-07-29 DIAGNOSIS — I890 Lymphedema, not elsewhere classified: Secondary | ICD-10-CM | POA: Diagnosis not present

## 2021-07-29 DIAGNOSIS — I272 Pulmonary hypertension, unspecified: Secondary | ICD-10-CM | POA: Diagnosis not present

## 2021-07-29 DIAGNOSIS — I2510 Atherosclerotic heart disease of native coronary artery without angina pectoris: Secondary | ICD-10-CM | POA: Diagnosis not present

## 2021-08-01 ENCOUNTER — Inpatient Hospital Stay
Admission: EM | Admit: 2021-08-01 | Discharge: 2021-08-05 | DRG: 191 | Disposition: A | Discharge status: Skilled Nursing Facility | Payer: Medicare PPO | Attending: Family Medicine | Admitting: Family Medicine

## 2021-08-01 ENCOUNTER — Emergency Department: Payer: Medicare PPO

## 2021-08-01 ENCOUNTER — Ambulatory Visit: Payer: Medicare PPO | Admitting: Podiatry

## 2021-08-01 ENCOUNTER — Other Ambulatory Visit: Payer: Self-pay

## 2021-08-01 ENCOUNTER — Telehealth: Payer: Self-pay | Admitting: Internal Medicine

## 2021-08-01 DIAGNOSIS — R062 Wheezing: Secondary | ICD-10-CM | POA: Diagnosis not present

## 2021-08-01 DIAGNOSIS — I10 Essential (primary) hypertension: Secondary | ICD-10-CM | POA: Diagnosis present

## 2021-08-01 DIAGNOSIS — Z8249 Family history of ischemic heart disease and other diseases of the circulatory system: Secondary | ICD-10-CM

## 2021-08-01 DIAGNOSIS — Z6835 Body mass index (BMI) 35.0-35.9, adult: Secondary | ICD-10-CM

## 2021-08-01 DIAGNOSIS — G8929 Other chronic pain: Secondary | ICD-10-CM | POA: Diagnosis present

## 2021-08-01 DIAGNOSIS — K219 Gastro-esophageal reflux disease without esophagitis: Secondary | ICD-10-CM | POA: Diagnosis present

## 2021-08-01 DIAGNOSIS — I2510 Atherosclerotic heart disease of native coronary artery without angina pectoris: Secondary | ICD-10-CM | POA: Diagnosis present

## 2021-08-01 DIAGNOSIS — E1122 Type 2 diabetes mellitus with diabetic chronic kidney disease: Secondary | ICD-10-CM | POA: Diagnosis present

## 2021-08-01 DIAGNOSIS — E782 Mixed hyperlipidemia: Secondary | ICD-10-CM | POA: Diagnosis present

## 2021-08-01 DIAGNOSIS — I272 Pulmonary hypertension, unspecified: Secondary | ICD-10-CM | POA: Diagnosis present

## 2021-08-01 DIAGNOSIS — Z79899 Other long term (current) drug therapy: Secondary | ICD-10-CM

## 2021-08-01 DIAGNOSIS — Z7901 Long term (current) use of anticoagulants: Secondary | ICD-10-CM | POA: Diagnosis not present

## 2021-08-01 DIAGNOSIS — F5104 Psychophysiologic insomnia: Secondary | ICD-10-CM | POA: Diagnosis present

## 2021-08-01 DIAGNOSIS — Z20822 Contact with and (suspected) exposure to covid-19: Secondary | ICD-10-CM | POA: Diagnosis not present

## 2021-08-01 DIAGNOSIS — Z8673 Personal history of transient ischemic attack (TIA), and cerebral infarction without residual deficits: Secondary | ICD-10-CM

## 2021-08-01 DIAGNOSIS — E669 Obesity, unspecified: Secondary | ICD-10-CM | POA: Diagnosis present

## 2021-08-01 DIAGNOSIS — J9811 Atelectasis: Secondary | ICD-10-CM | POA: Diagnosis not present

## 2021-08-01 DIAGNOSIS — Z7401 Bed confinement status: Secondary | ICD-10-CM | POA: Diagnosis not present

## 2021-08-01 DIAGNOSIS — Z86711 Personal history of pulmonary embolism: Secondary | ICD-10-CM

## 2021-08-01 DIAGNOSIS — N1832 Chronic kidney disease, stage 3b: Secondary | ICD-10-CM | POA: Diagnosis present

## 2021-08-01 DIAGNOSIS — D649 Anemia, unspecified: Secondary | ICD-10-CM | POA: Diagnosis present

## 2021-08-01 DIAGNOSIS — D631 Anemia in chronic kidney disease: Secondary | ICD-10-CM | POA: Diagnosis not present

## 2021-08-01 DIAGNOSIS — R1032 Left lower quadrant pain: Secondary | ICD-10-CM

## 2021-08-01 DIAGNOSIS — E039 Hypothyroidism, unspecified: Secondary | ICD-10-CM | POA: Diagnosis present

## 2021-08-01 DIAGNOSIS — Z833 Family history of diabetes mellitus: Secondary | ICD-10-CM

## 2021-08-01 DIAGNOSIS — Z79890 Hormone replacement therapy (postmenopausal): Secondary | ICD-10-CM

## 2021-08-01 DIAGNOSIS — M25552 Pain in left hip: Secondary | ICD-10-CM

## 2021-08-01 DIAGNOSIS — Z803 Family history of malignant neoplasm of breast: Secondary | ICD-10-CM

## 2021-08-01 DIAGNOSIS — E1142 Type 2 diabetes mellitus with diabetic polyneuropathy: Secondary | ICD-10-CM | POA: Diagnosis present

## 2021-08-01 DIAGNOSIS — G500 Trigeminal neuralgia: Secondary | ICD-10-CM

## 2021-08-01 DIAGNOSIS — J471 Bronchiectasis with (acute) exacerbation: Secondary | ICD-10-CM | POA: Diagnosis not present

## 2021-08-01 DIAGNOSIS — Z87891 Personal history of nicotine dependence: Secondary | ICD-10-CM

## 2021-08-01 DIAGNOSIS — M25561 Pain in right knee: Secondary | ICD-10-CM | POA: Diagnosis not present

## 2021-08-01 DIAGNOSIS — R609 Edema, unspecified: Secondary | ICD-10-CM | POA: Diagnosis not present

## 2021-08-01 DIAGNOSIS — J479 Bronchiectasis, uncomplicated: Secondary | ICD-10-CM

## 2021-08-01 DIAGNOSIS — Z66 Do not resuscitate: Secondary | ICD-10-CM | POA: Diagnosis not present

## 2021-08-01 DIAGNOSIS — M25551 Pain in right hip: Secondary | ICD-10-CM | POA: Diagnosis not present

## 2021-08-01 DIAGNOSIS — H9193 Unspecified hearing loss, bilateral: Secondary | ICD-10-CM | POA: Diagnosis present

## 2021-08-01 DIAGNOSIS — M25761 Osteophyte, right knee: Secondary | ICD-10-CM | POA: Diagnosis not present

## 2021-08-01 DIAGNOSIS — R0902 Hypoxemia: Secondary | ICD-10-CM | POA: Diagnosis not present

## 2021-08-01 DIAGNOSIS — M545 Low back pain, unspecified: Secondary | ICD-10-CM

## 2021-08-01 DIAGNOSIS — F32A Depression, unspecified: Secondary | ICD-10-CM | POA: Diagnosis present

## 2021-08-01 DIAGNOSIS — J441 Chronic obstructive pulmonary disease with (acute) exacerbation: Principal | ICD-10-CM | POA: Diagnosis present

## 2021-08-01 DIAGNOSIS — R52 Pain, unspecified: Secondary | ICD-10-CM | POA: Diagnosis not present

## 2021-08-01 DIAGNOSIS — J449 Chronic obstructive pulmonary disease, unspecified: Secondary | ICD-10-CM | POA: Diagnosis present

## 2021-08-01 DIAGNOSIS — J45909 Unspecified asthma, uncomplicated: Secondary | ICD-10-CM | POA: Diagnosis not present

## 2021-08-01 DIAGNOSIS — Z86718 Personal history of other venous thrombosis and embolism: Secondary | ICD-10-CM | POA: Diagnosis not present

## 2021-08-01 DIAGNOSIS — K760 Fatty (change of) liver, not elsewhere classified: Secondary | ICD-10-CM | POA: Diagnosis present

## 2021-08-01 DIAGNOSIS — M7989 Other specified soft tissue disorders: Secondary | ICD-10-CM | POA: Diagnosis not present

## 2021-08-01 DIAGNOSIS — I129 Hypertensive chronic kidney disease with stage 1 through stage 4 chronic kidney disease, or unspecified chronic kidney disease: Secondary | ICD-10-CM | POA: Diagnosis not present

## 2021-08-01 DIAGNOSIS — J9611 Chronic respiratory failure with hypoxia: Secondary | ICD-10-CM | POA: Diagnosis not present

## 2021-08-01 DIAGNOSIS — Z85828 Personal history of other malignant neoplasm of skin: Secondary | ICD-10-CM

## 2021-08-01 DIAGNOSIS — M79604 Pain in right leg: Secondary | ICD-10-CM

## 2021-08-01 DIAGNOSIS — R069 Unspecified abnormalities of breathing: Secondary | ICD-10-CM | POA: Diagnosis not present

## 2021-08-01 DIAGNOSIS — R531 Weakness: Secondary | ICD-10-CM | POA: Diagnosis not present

## 2021-08-01 DIAGNOSIS — R600 Localized edema: Secondary | ICD-10-CM | POA: Diagnosis not present

## 2021-08-01 DIAGNOSIS — R0602 Shortness of breath: Secondary | ICD-10-CM | POA: Diagnosis not present

## 2021-08-01 DIAGNOSIS — E1140 Type 2 diabetes mellitus with diabetic neuropathy, unspecified: Secondary | ICD-10-CM | POA: Diagnosis not present

## 2021-08-01 DIAGNOSIS — F419 Anxiety disorder, unspecified: Secondary | ICD-10-CM | POA: Diagnosis present

## 2021-08-01 LAB — CBC
HCT: 42.1 % (ref 36.0–46.0)
Hemoglobin: 13.4 g/dL (ref 12.0–15.0)
MCH: 30.2 pg (ref 26.0–34.0)
MCHC: 31.8 g/dL (ref 30.0–36.0)
MCV: 94.8 fL (ref 80.0–100.0)
Platelets: 218 10*3/uL (ref 150–400)
RBC: 4.44 MIL/uL (ref 3.87–5.11)
RDW: 14.2 % (ref 11.5–15.5)
WBC: 7.3 10*3/uL (ref 4.0–10.5)
nRBC: 0 % (ref 0.0–0.2)

## 2021-08-01 LAB — HEPATIC FUNCTION PANEL
ALT: 17 U/L (ref 0–44)
AST: 25 U/L (ref 15–41)
Albumin: 3.5 g/dL (ref 3.5–5.0)
Alkaline Phosphatase: 68 U/L (ref 38–126)
Bilirubin, Direct: <0.1 mg/dL (ref 0.0–0.2)
Total Bilirubin: 0.7 mg/dL (ref 0.3–1.2)
Total Protein: 6.9 g/dL (ref 6.5–8.1)

## 2021-08-01 LAB — BRAIN NATRIURETIC PEPTIDE: B Natriuretic Peptide: 64 pg/mL (ref 0.0–100.0)

## 2021-08-01 LAB — BASIC METABOLIC PANEL
Anion gap: 8 (ref 5–15)
BUN: 21 mg/dL (ref 8–23)
CO2: 34 mmol/L — ABNORMAL HIGH (ref 22–32)
Calcium: 8.7 mg/dL — ABNORMAL LOW (ref 8.9–10.3)
Chloride: 100 mmol/L (ref 98–111)
Creatinine, Ser: 1.34 mg/dL — ABNORMAL HIGH (ref 0.44–1.00)
GFR, Estimated: 37 mL/min — ABNORMAL LOW (ref >60)
Glucose, Bld: 129 mg/dL — ABNORMAL HIGH (ref 70–99)
Potassium: 3.7 mmol/L (ref 3.5–5.1)
Sodium: 142 mmol/L (ref 135–145)

## 2021-08-01 LAB — RESP PANEL BY RT-PCR (FLU A&B, COVID) ARPGX2
Influenza A by PCR: NEGATIVE
Influenza B by PCR: NEGATIVE
SARS Coronavirus 2 by RT PCR: NEGATIVE

## 2021-08-01 LAB — TROPONIN I (HIGH SENSITIVITY): Troponin I (High Sensitivity): 9 ng/L (ref <18)

## 2021-08-01 LAB — CBG MONITORING, ED: Glucose-Capillary: 114 mg/dL — ABNORMAL HIGH (ref 70–99)

## 2021-08-01 MED ORDER — ALBUTEROL SULFATE (2.5 MG/3ML) 0.083% IN NEBU: 2.5000 mg | INHALATION_SOLUTION | Freq: Four times a day (QID) | RESPIRATORY_TRACT | Status: DC | PRN

## 2021-08-01 MED ORDER — OXYCODONE HCL 5 MG PO TABS
2.5000 mg | ORAL_TABLET | Freq: Once | ORAL | Status: AC
  Administered 2021-08-01: 2.5 mg via ORAL
  Filled 2021-08-01: qty 1

## 2021-08-01 MED ORDER — ADULT MULTIVITAMIN W/MINERALS CH
1.0000 | ORAL_TABLET | Freq: Every day | ORAL | Status: DC
  Administered 2021-08-02 – 2021-08-05 (×4): 1 via ORAL
  Filled 2021-08-01 (×4): qty 1

## 2021-08-01 MED ORDER — PREDNISONE 20 MG PO TABS
40.0000 mg | ORAL_TABLET | Freq: Every day | ORAL | Status: DC
Start: 2021-08-03 — End: 2021-08-05
  Administered 2021-08-03 – 2021-08-05 (×3): 40 mg via ORAL
  Filled 2021-08-01 (×3): qty 2

## 2021-08-01 MED ORDER — LEVOTHYROXINE SODIUM 100 MCG PO TABS
200.0000 ug | ORAL_TABLET | ORAL | Status: DC
  Administered 2021-08-02 – 2021-08-05 (×4): 200 ug via ORAL
  Filled 2021-08-01 (×4): qty 2

## 2021-08-01 MED ORDER — DOCUSATE SODIUM 50 MG/5ML PO LIQD
50.0000 mg | Freq: Every day | ORAL | Status: DC
  Administered 2021-08-02 – 2021-08-05 (×3): 50 mg via ORAL
  Filled 2021-08-01 (×4): qty 10

## 2021-08-01 MED ORDER — MELATONIN 5 MG PO TABS
10.0000 mg | ORAL_TABLET | Freq: Every day | ORAL | Status: DC
  Administered 2021-08-01 – 2021-08-04 (×4): 10 mg via ORAL
  Filled 2021-08-01 (×5): qty 2

## 2021-08-01 MED ORDER — GABAPENTIN 300 MG PO CAPS
400.0000 mg | ORAL_CAPSULE | Freq: Three times a day (TID) | ORAL | Status: DC
  Administered 2021-08-01 – 2021-08-05 (×11): 400 mg via ORAL
  Filled 2021-08-01 (×10): qty 1
  Filled 2021-08-01: qty 4

## 2021-08-01 MED ORDER — METHYLPREDNISOLONE SODIUM SUCC 40 MG IJ SOLR
40.0000 mg | Freq: Two times a day (BID) | INTRAMUSCULAR | Status: AC
Start: 2021-08-02 — End: 2021-08-02
  Administered 2021-08-02 (×2): 40 mg via INTRAVENOUS
  Filled 2021-08-01 (×2): qty 1

## 2021-08-01 MED ORDER — ACETAMINOPHEN-CODEINE #3 300-30 MG PO TABS: 1.0000 | ORAL_TABLET | Freq: Two times a day (BID) | ORAL | Status: DC | PRN

## 2021-08-01 MED ORDER — POLYETHYLENE GLYCOL 3350 17 GM/SCOOP PO POWD
17.0000 g | Freq: Every day | ORAL | Status: DC
  Filled 2021-08-01: qty 255

## 2021-08-01 MED ORDER — ATORVASTATIN CALCIUM 20 MG PO TABS
40.0000 mg | ORAL_TABLET | Freq: Every day | ORAL | Status: DC
  Administered 2021-08-02 – 2021-08-04 (×3): 40 mg via ORAL
  Filled 2021-08-01 (×4): qty 2

## 2021-08-01 MED ORDER — LORATADINE 10 MG PO TABS
10.0000 mg | ORAL_TABLET | Freq: Every day | ORAL | Status: DC
  Administered 2021-08-02 – 2021-08-05 (×4): 10 mg via ORAL
  Filled 2021-08-01 (×4): qty 1

## 2021-08-01 MED ORDER — MUPIROCIN 2 % EX OINT
1.0000 "application " | TOPICAL_OINTMENT | Freq: Three times a day (TID) | CUTANEOUS | Status: DC
  Administered 2021-08-02 (×2): 1 via TOPICAL
  Filled 2021-08-01: qty 22

## 2021-08-01 MED ORDER — APIXABAN 2.5 MG PO TABS
2.5000 mg | ORAL_TABLET | Freq: Two times a day (BID) | ORAL | Status: DC
  Administered 2021-08-02 – 2021-08-05 (×7): 2.5 mg via ORAL
  Filled 2021-08-01 (×7): qty 1

## 2021-08-01 MED ORDER — MIRTAZAPINE 15 MG PO TABS
15.0000 mg | ORAL_TABLET | Freq: Every day | ORAL | Status: DC
Start: 2021-08-01 — End: 2021-08-05
  Administered 2021-08-01 – 2021-08-04 (×4): 15 mg via ORAL
  Filled 2021-08-01 (×4): qty 1

## 2021-08-01 MED ORDER — IPRATROPIUM-ALBUTEROL 0.5-2.5 (3) MG/3ML IN SOLN
3.0000 mL | Freq: Four times a day (QID) | RESPIRATORY_TRACT | Status: DC
  Administered 2021-08-02 (×3): 3 mL via RESPIRATORY_TRACT
  Filled 2021-08-01 (×3): qty 3

## 2021-08-01 MED ORDER — LIDOCAINE 5 % EX PTCH
1.0000 | MEDICATED_PATCH | CUTANEOUS | Status: DC
  Administered 2021-08-01: 1 via TRANSDERMAL
  Filled 2021-08-01 (×5): qty 1

## 2021-08-01 MED ORDER — FLUTICASONE-UMECLIDIN-VILANT 100-62.5-25 MCG/INH IN AEPB: 1.0000 | INHALATION_SPRAY | Freq: Every day | RESPIRATORY_TRACT | Status: DC

## 2021-08-01 MED ORDER — METHYLPREDNISOLONE SODIUM SUCC 125 MG IJ SOLR
125.0000 mg | Freq: Once | INTRAMUSCULAR | Status: AC
  Administered 2021-08-01: 125 mg via INTRAVENOUS
  Filled 2021-08-01: qty 2

## 2021-08-01 MED ORDER — POLYVINYL ALCOHOL 1.4 % OP SOLN
1.0000 [drp] | OPHTHALMIC | Status: DC | PRN
  Filled 2021-08-01 (×2): qty 15

## 2021-08-01 MED ORDER — ACETAMINOPHEN 325 MG PO TABS: 650.0000 mg | ORAL_TABLET | Freq: Four times a day (QID) | ORAL | Status: DC | PRN

## 2021-08-01 MED ORDER — CLOTRIMAZOLE-BETAMETHASONE 1-0.05 % EX CREA
1.0000 "application " | TOPICAL_CREAM | Freq: Two times a day (BID) | CUTANEOUS | Status: DC
  Administered 2021-08-02 (×2): 1 via TOPICAL
  Filled 2021-08-01: qty 15

## 2021-08-01 MED ORDER — CALCIUM CARBONATE-VITAMIN D 500-200 MG-UNIT PO TABS
1.0000 | ORAL_TABLET | Freq: Two times a day (BID) | ORAL | Status: DC
  Administered 2021-08-01 – 2021-08-05 (×8): 1 via ORAL
  Filled 2021-08-01 (×8): qty 1

## 2021-08-01 MED ORDER — BISACODYL 5 MG PO TBEC: 5.0000 mg | DELAYED_RELEASE_TABLET | Freq: Every day | ORAL | Status: DC | PRN

## 2021-08-01 MED ORDER — HYDROCORTISONE 2.5 % EX CREA
1.0000 "application " | TOPICAL_CREAM | Freq: Two times a day (BID) | CUTANEOUS | Status: DC
  Filled 2021-08-01 (×2): qty 30

## 2021-08-01 MED ORDER — GUAIFENESIN ER 600 MG PO TB12
1200.0000 mg | ORAL_TABLET | Freq: Two times a day (BID) | ORAL | Status: DC | PRN
  Administered 2021-08-04: 1200 mg via ORAL
  Filled 2021-08-01: qty 2

## 2021-08-01 MED ORDER — ACETAMINOPHEN 500 MG PO TABS
1000.0000 mg | ORAL_TABLET | Freq: Once | ORAL | Status: AC
  Administered 2021-08-01: 1000 mg via ORAL
  Filled 2021-08-01: qty 2

## 2021-08-01 MED ORDER — SODIUM CHLORIDE 0.45 % IV SOLN: INTRAVENOUS | Status: DC

## 2021-08-01 MED ORDER — IPRATROPIUM-ALBUTEROL 0.5-2.5 (3) MG/3ML IN SOLN
3.0000 mL | Freq: Once | RESPIRATORY_TRACT | Status: AC
  Administered 2021-08-01: 3 mL via RESPIRATORY_TRACT
  Filled 2021-08-01: qty 3

## 2021-08-01 MED ORDER — AZELAIC ACID 15 % EX GEL: 1.0000 "application " | Freq: Two times a day (BID) | CUTANEOUS | Status: DC

## 2021-08-01 MED ORDER — INSULIN ASPART 100 UNIT/ML IJ SOLN
0.0000 [IU] | Freq: Three times a day (TID) | INTRAMUSCULAR | Status: DC
  Administered 2021-08-02: 09:00:00 3 [IU] via SUBCUTANEOUS
  Administered 2021-08-02: 2 [IU] via SUBCUTANEOUS
  Administered 2021-08-02: 12:00:00 3 [IU] via SUBCUTANEOUS
  Administered 2021-08-03 – 2021-08-05 (×2): 2 [IU] via SUBCUTANEOUS
  Administered 2021-08-05: 1 [IU] via SUBCUTANEOUS
  Filled 2021-08-01 (×6): qty 1

## 2021-08-01 MED ORDER — GABAPENTIN 400 MG PO CAPS
400.0000 mg | ORAL_CAPSULE | Freq: Once | ORAL | Status: AC
  Administered 2021-08-01: 400 mg via ORAL
  Filled 2021-08-01: qty 4

## 2021-08-01 MED ORDER — ALBUTEROL SULFATE (2.5 MG/3ML) 0.083% IN NEBU: 2.5000 mg | INHALATION_SOLUTION | RESPIRATORY_TRACT | Status: DC | PRN

## 2021-08-01 MED ORDER — FUROSEMIDE 40 MG PO TABS
40.0000 mg | ORAL_TABLET | Freq: Every day | ORAL | Status: DC
  Administered 2021-08-02 – 2021-08-05 (×4): 40 mg via ORAL
  Filled 2021-08-01 (×4): qty 1

## 2021-08-01 MED ORDER — DOXYCYCLINE HYCLATE 100 MG PO TABS
100.0000 mg | ORAL_TABLET | Freq: Two times a day (BID) | ORAL | Status: DC
Start: 2021-08-01 — End: 2021-08-05
  Administered 2021-08-01 – 2021-08-05 (×8): 100 mg via ORAL
  Filled 2021-08-01 (×8): qty 1

## 2021-08-01 MED ORDER — PANTOPRAZOLE SODIUM 40 MG PO TBEC
40.0000 mg | DELAYED_RELEASE_TABLET | Freq: Every day | ORAL | Status: DC
  Administered 2021-08-02 – 2021-08-05 (×4): 40 mg via ORAL
  Filled 2021-08-01 (×4): qty 1

## 2021-08-01 NOTE — ED Notes (Signed)
Pt given crackers, graham crackers per request

## 2021-08-01 NOTE — ED Notes (Signed)
Pt placed on purewick 

## 2021-08-01 NOTE — H&P (Signed)
History and Physical   Jessica Erickson:[id number] DOB: 10-18-1926 DOA: 08/01/2021  Referring MD/NP/PA: Dr. Jari Pigg  PCP: McLean-Scocuzza, Nino Glow, MD   Outpatient Specialists: None  Patient coming from: Home  Chief Complaint: Shortness of breath and pain on the right side  HPI: Jessica Erickson is a 87 y.o. female with medical history significant of acoustic neuroma with bilateral loss of hearing, asthma, COPD, coronary artery disease, diabetes, peripheral neuropathy, history of DVT on Eliquis, recurrent falls, history of CVA, history of depression who was brought in by daughter secondary to severe right lower extremity pain patient unable to stand on her feet.  Also shortness of breath consistent with her typical COPD exacerbation.  She was seen and evaluated in the ER.  Initial treatment for COPD was given.  Patient showed improvement but not completely.  No fever or chills.  No nausea vomiting.  Patient will ultimately require inpatient hospitalization with further treatment.  Her pain is unrelieved by her home gabapentin.  Patient is on 2 L of oxygen.  Work-up for the right-sided pain also so far negative.  Suspected to be neurology pain from her known peripheral neuropathy that is apparently getting out of control.  She is being admitted to the hospital for evaluation and treatment.  ED Course: Temperature 97.7 blood pressure 110/57, pulse of 50 respiratory rate of 22 oxygen sat 82% on room air currently 100% on 2 L.  White count 7.3 with a CBC within normal.  Sodium is 142 potassium 3.7 chloride 100 CO2 34 BUN 29 creatinine 1.34 and calcium 8.7.  Glucose is 114.  X-ray of the knee tibia-fibula on the right all within normal venous Doppler ultrasound also showed no acute findings x-ray of the right hip showed no acute findings acute viral screen including influenza and COVID-19 were both negative.  At this point she is being admitted for acute exacerbation of COPD as well as acute neuropathic pain  on chronic pain  Review of Systems: As per HPI otherwise 10 point review of systems negative.    Past Medical History:  Diagnosis Date   Acoustic neuroma (HCC)    Allergy    Asthma    Bilateral swelling of feet    and legs   Bladder infection    CAD (coronary artery disease)    Cataract    Change in voice    Compression fracture of body of thoracic vertebra (HCC)    T12 09/18/15 MRI s/p fall    Constipation    COPD (chronic obstructive pulmonary disease) (HCC)    previous CXR with chronic interstitial lung dz    CVA (cerebral vascular accident) ([place])    Depression    Diabetes ([place])    with neuropathy   Diabetes mellitus, type 2 ([place])    Diarrhea    Double vision    DVT (deep venous thrombosis) ([place])    right leg 10/2015 was on coumadin off as of 2017/2018 ; s/p IVC filter   Enuresis    Eye pain, right    Fall    Fatty liver    09/15/15 also mildly dilated pancreatitic duct rec MRCP small sub cm cyst hemangioma speeln mild right hydronephrorossi and prox. hydroureter, kidney stones, mild scarring kidneys   Female stress incontinence    Flank pain    GERD (gastroesophageal reflux disease)    with small hiatal hernia    Hard of hearing    Heart disease    History of kidney problems  Hyperlipidemia    mixed   Hypertension    Hypothyroidism, postsurgical    Impaired mobility and ADLs    uses rolling walker has caretaker 24/7 at home   Leg edema    Mixed incontinence urge and stress (female)(female)    Neuropathy    Osteoarthritis    DDD spine    Osteoporosis with fracture    T12 compression fracture   Photophobia    Pulmonary embolism ([place])    10/2015 off coumadin as of 04/2016   Pulmonary HTN (HCC)    mild pulm HTN, echo 10/09/15 EF 27-78%EUMPN 1 dd, RV systolic pressure increased    Recurrent UTI    Sinus pressure    Skin cancer    BCC jawline and scalp    Thyroid disease    follows [facility]   TIA (transient ischemic attack)    MRI 2009/2010 neg  stroke    Trigeminal neuralgia    Dr. Tomi Bamberger s/p gamma knife x 2, on Tegretol since 2011/2012 no increase in dose >200 mg bid rec per family per neurology    Urinary frequency    Urinary, incontinence, stress female    Dr Erlene Quan urology     Past Surgical History:  Procedure Laterality Date   APPENDECTOMY     as a child, open   Hubbell removal 1996    brain tumor surgery     BREAST SURGERY     breast bx   CATARACT EXTRACTION     CHOLECYSTECTOMY     EYE SURGERY     cataract   IVC FILTER PLACEMENT ([place] HX)     Dr. Lucky Cowboy 10/2015    LAPAROSCOPIC TUBAL LIGATION     MOHS SURGERY     scalp 04/2014    PERIPHERAL VASCULAR CATHETERIZATION N/A 10/11/2015   Procedure: IVC Filter Insertion;  Surgeon: Algernon Huxley, MD;  Location: [facility];  Service: Cardiovascular;  Laterality: N/A;   PERIPHERAL VASCULAR THROMBECTOMY Bilateral 03/29/2018   Procedure: PERIPHERAL VASCULAR THROMBECTOMY;  Surgeon: Algernon Huxley, MD;  Location: [facility];  Service: Cardiovascular;  Laterality: Bilateral;   PUBOVAGINAL SLING     THROAT SURGERY     THYROID SURGERY     tumor around vocal cords    TOOTH EXTRACTION     winter 2018    TOTAL THYROIDECTOMY  1976     reports that she quit smoking about 25 years ago. Her smoking use included cigarettes. She has a 10.00 pack-year smoking history. She has never used smokeless tobacco. She reports that she does not drink alcohol and does not use drugs.  Allergies  Allergen Reactions   Penicillins Shortness Of Breath, Rash and Other (See Comments)    Has patient had a PCN reaction causing immediate rash, facial/tongue/throat swelling, SOB or lightheadedness with hypotension: Yes Has patient had a PCN reaction causing severe rash involving mucus membranes or skin necrosis: No Has patient had a PCN reaction that required hospitalization No Has patient had a PCN reaction occurring within the last 10 years: No If all of the above  answers are "NO", then may proceed with Cephalosporin use.   Sulfa Antibiotics Shortness Of Breath, Rash and Other (See Comments)   Amitiza [Lubiprostone]     N/v/d   Aspirin Other (See Comments)    Reaction:  Unknown  Other reaction(s): Bleeding (intolerance) Per patient " causes nose to bleed" Can take 81 mg daily without any complications  Other reaction(s): "bloody nose"    Penicillin G Other (See Comments)    Has patient had a PCN reaction causing immediate rash, facial/tongue/throat swelling, SOB or lightheadedness with hypotension: No Has patient had a PCN reaction causing severe rash involving mucus membranes or skin necrosis: Unknown Has patient had a PCN reaction that required hospitalization: Unknown Has patient had a PCN reaction occurring within the last 10 years: Unknown If all of the above answers are "NO", then may proceed with Cephalosporin use.    Family History  Problem Relation Age of Onset   Heart disease Mother    Diabetes Father    Cancer Daughter        breast ca x 2 s/p mastectomy      Prior to Admission medications   Medication Sig Start Date End Date Taking? Authorizing Provider  acetaminophen (TYLENOL) 325 MG tablet Take 2 tablets (650 mg total) by mouth every 6 (six) hours as needed. 07/30/20  Yes McLean-Scocuzza, Nino Glow, MD  albuterol (PROVENTIL) (2.5 MG/3ML) 0.083% nebulizer solution Take 3 mLs (2.5 mg total) by nebulization every 6 (six) hours as needed for wheezing or shortness of breath. 05/05/21  Yes Chesley Mires, MD  apixaban (ELIQUIS) 2.5 MG TABS tablet Take 1 tablet (2.5 mg total) by mouth 2 (two) times daily. 03/29/21  Yes McLean-Scocuzza, Nino Glow, MD  atorvastatin (LIPITOR) 40 MG tablet Take 1 tablet (40 mg total) by mouth daily at 6 PM. Generic ok Patient taking differently: Take 40 mg by mouth daily. Generic ok 03/29/21  Yes McLean-Scocuzza, Nino Glow, MD  Azelaic Acid 15 % gel Apply 1 application topically 2 (two) times daily. After skin is  thoroughly washed and patted dry, gently but thoroughly massage a thin film of azelaic acid cream into the affected area twice daily, in the morning and evening. 03/29/21  Yes McLean-Scocuzza, Nino Glow, MD  BISACODYL PO Take by mouth.   Yes [provider]  Calcium Carbonate-Vitamin D (CALCIUM 600+D) 600-400 MG-UNIT tablet Take 1 tablet by mouth 2 (two) times daily. Lunch and dinner 07/23/19  Yes McLean-Scocuzza, Nino Glow, MD  cetirizine (ZYRTEC) 10 MG tablet Take 1 tablet (10 mg total) by mouth daily. 03/29/21  Yes McLean-Scocuzza, Nino Glow, MD  clotrimazole-betamethasone (LOTRISONE) lotion Apply 1 application topically 2 (two) times daily. 10/11/20  Yes [provider]  Docusate Sodium (DOK PO) Take 1 tablet by mouth daily.   Yes [provider]  Fluticasone-Umeclidin-Vilant (TRELEGY ELLIPTA) 100-62.5-25 MCG/INH AEPB Inhale 1 puff into the lungs daily. Rinse mouth d/c symbicort and spiriva 03/29/21  Yes McLean-Scocuzza, Nino Glow, MD  furosemide (LASIX) 40 MG tablet Qd and Take 2nd dose at lunch 40 mg if weight increased 4 to 5 lbs each day Patient taking differently: Take 40 mg by mouth daily. Qd and Take 2nd dose at lunch 40 mg if weight increased 4 to 5 lbs each day 06/28/21  Yes McLean-Scocuzza, Nino Glow, MD  gabapentin (NEURONTIN) 400 MG capsule Take 1 capsule (400 mg total) by mouth 3 (three) times daily. 11/11/20  Yes McLean-Scocuzza, Nino Glow, MD  hydrocortisone 2.5 % cream Apply topically 2 (two) times daily. 04/06/20  Yes McLean-Scocuzza, Nino Glow, MD  levothyroxine (SYNTHROID) 200 MCG tablet Take 1 tablet (200 mcg total) by mouth daily before breakfast. Except on Sunday. Do not take with other medications or vitamins 03/29/21  Yes McLean-Scocuzza, Nino Glow, MD  Melatonin 10 MG TABS Take 10 mg by mouth at bedtime. 04/19/21  Yes McLean-Scocuzza, Nino Glow, MD  mirtazapine (REMERON) 15 MG tablet Take 1 tablet (15 mg total) by mouth at bedtime. 03/29/21  Yes McLean-Scocuzza, Nino Glow, MD   Multiple Vitamin (MULTIVITAMIN WITH MINERALS) TABS tablet Take 1 tablet by mouth daily.    Yes [provider]  mupirocin ointment (BACTROBAN) 2 % Apply 1 application topically 3 (three) times daily. Right thumb 03/29/21  Yes McLean-Scocuzza, Nino Glow, MD  pantoprazole (PROTONIX) 40 MG tablet Take 1 tablet (40 mg total) by mouth daily. 30 minutes before lunch or dinner 03/29/21  Yes McLean-Scocuzza, Nino Glow, MD  polyethylene glycol powder (GLYCOLAX/MIRALAX) 17 GM/SCOOP powder Take 17 g by mouth daily. 04/06/20  Yes McLean-Scocuzza, Nino Glow, MD  polyvinyl alcohol (LIQUIFILM TEARS) 1.4 % ophthalmic solution Place 1 drop into both eyes as needed for dry eyes. 04/06/20  Yes McLean-Scocuzza, Nino Glow, MD  acetaminophen-codeine (TYLENOL #3) 300-30 MG tablet Take 1 tablet by mouth 2 (two) times daily as needed for moderate pain. 06/28/21   McLean-Scocuzza, Nino Glow, MD  guaiFENesin (MUCINEX) 600 MG 12 hr tablet Take 2 tablets (1,200 mg total) by mouth 2 (two) times daily as needed for cough or to loosen phlegm. 05/05/21   Chesley Mires, MD    Physical Exam: Vitals:   08/01/21 1542 08/01/21 1701 08/01/21 2003 08/01/21 2007  BP: (!) 110/57 112/67  111/90  Pulse: 71 74 (!) 55 (!) 59  Resp:  (!) 22  20  Temp:      TempSrc:      SpO2: 93% 100% 100% 100%      Constitutional: Pleasant, bilateral hearing loss v Vitals:   08/01/21 1542 08/01/21 1701 08/01/21 2003 08/01/21 2007  BP: (!) 110/57 112/67  111/90  Pulse: 71 74 (!) 55 (!) 59  Resp:  (!) 22  20  Temp:      TempSrc:      SpO2: 93% 100% 100% 100%   Eyes: PERRL, lids and conjunctivae normal ENMT: Mucous membranes are moist. Posterior pharynx clear of any exudate or lesions.Normal dentition.  Neck: normal, supple, no masses, no thyromegaly Respiratory: Good air entry bilaterally with some mild expiratory wheezing normal respiratory effort. No accessory muscle use.  Cardiovascular: Regular rate and rhythm, no murmurs / rubs / gallops. No  extremity edema. 2+ pedal pulses. No carotid bruits.  Abdomen: no tenderness, no masses palpated. No hepatosplenomegaly. Bowel sounds positive.  Musculoskeletal: no clubbing / cyanosis. No joint deformity upper and lower extremities. Good ROM, no contractures. Normal muscle tone.  Skin: no rashes, lesions, ulcers. No induration Neurologic: CN 2-12 grossly intact. Sensation intact, DTR normal. Strength 5/5 in all 4.  Bilateral hearing loss Psychiatric: Difficult to assess due to language barrier.     Labs on Admission: I have personally reviewed following labs and imaging studies  CBC: Recent Labs  Lab 08/01/21 1542  WBC 7.3  HGB 13.4  HCT 42.1  MCV 94.8  PLT 086   Basic Metabolic Panel: Recent Labs  Lab 08/01/21 1542  NA 142  K 3.7  CL 100  CO2 34*  GLUCOSE 129*  BUN 21  CREATININE 1.34*  CALCIUM 8.7*   GFR: CrCl cannot be calculated (Unknown ideal weight.). Liver Function Tests: Recent Labs  Lab 08/01/21 1542  AST 25  ALT 17  ALKPHOS 68  BILITOT 0.7  PROT 6.9  ALBUMIN 3.5   No results for input(s): LIPASE, AMYLASE in the last 168 hours. No results for input(s): AMMONIA in the last 168 hours. Coagulation Profile: No results for input(s): INR, PROTIME  in the last 168 hours. Cardiac Enzymes: No results for input(s): CKTOTAL, CKMB, CKMBINDEX, TROPONINI in the last 168 hours. BNP (last 3 results) No results for input(s): PROBNP in the last 8760 hours. HbA1C: No results for input(s): HGBA1C in the last 72 hours. CBG: Recent Labs  Lab 08/01/21 1943  GLUCAP 114*   Lipid Profile: No results for input(s): CHOL, HDL, LDLCALC, TRIG, CHOLHDL, LDLDIRECT in the last 72 hours. Thyroid Function Tests: No results for input(s): TSH, T4TOTAL, FREET4, T3FREE, THYROIDAB in the last 72 hours. Anemia Panel: No results for input(s): VITAMINB12, FOLATE, FERRITIN, TIBC, IRON, RETICCTPCT in the last 72 hours. Urine analysis:    Component Value Date/Time   COLORURINE  YELLOW 06/08/2021 1600   APPEARANCEUR CLEAR 06/08/2021 1600   APPEARANCEUR Cloudy (A) 02/27/2019 1055   LABSPEC 1.015 06/08/2021 1600   PHURINE 7.0 06/08/2021 1600   GLUCOSEU NEGATIVE 06/08/2021 1600   GLUCOSEU NEGATIVE 12/24/2017 0931   HGBUR NEGATIVE 06/08/2021 1600   BILIRUBINUR NEGATIVE 06/08/2021 1600   BILIRUBINUR Negative 02/27/2019 1055   KETONESUR NEGATIVE 06/08/2021 1600   PROTEINUR NEGATIVE 06/08/2021 1600   UROBILINOGEN 0.2 12/26/2018 1523   UROBILINOGEN 0.2 12/24/2017 0931   NITRITE NEGATIVE 06/08/2021 1600   LEUKOCYTESUR NEGATIVE 06/08/2021 1600   Sepsis Labs: @LABRCNTIP (procalcitonin:4,lacticidven:4) ) Recent Results (from the past 240 hour(s))  Resp Panel by RT-PCR (Flu A&B, Covid) Nasopharyngeal Swab     Status: None   Collection Time: 08/01/21  4:35 PM   Specimen: Nasopharyngeal Swab; Nasopharyngeal(NP) swabs in vial transport medium  Result Value Ref Range Status   SARS Coronavirus 2 by RT PCR NEGATIVE NEGATIVE Final    Comment: (NOTE) SARS-CoV-2 target nucleic acids are NOT DETECTED.  The SARS-CoV-2 RNA is generally detectable in upper respiratory specimens during the acute phase of infection. The lowest concentration of SARS-CoV-2 viral copies this assay can detect is 138 copies/mL. A negative result does not preclude SARS-Cov-2 infection and should not be used as the sole basis for treatment or other patient management decisions. A negative result may occur with  improper specimen collection/handling, submission of specimen other than nasopharyngeal swab, presence of viral mutation(s) within the areas targeted by this assay, and inadequate number of viral copies(<138 copies/mL). A negative result must be combined with clinical observations, patient history, and epidemiological information. The expected result is Negative.  Fact Sheet for Patients:  EntrepreneurPulse.com.au  Fact Sheet for Healthcare Providers:   IncredibleEmployment.be  This test is no t yet approved or cleared by the [place] FDA and  has been authorized for detection and/or diagnosis of SARS-CoV-2 by FDA under an Emergency Use Authorization (EUA). This EUA will remain  in effect (meaning this test can be used) for the duration of the COVID-19 declaration under Section 564(b)(1) of the Act, 21 U.S.C.section 360bbb-3(b)(1), unless the authorization is terminated  or revoked sooner.       Influenza A by PCR NEGATIVE NEGATIVE Final   Influenza B by PCR NEGATIVE NEGATIVE Final    Comment: (NOTE) The Xpert Xpress SARS-CoV-2/FLU/RSV plus assay is intended as an aid in the diagnosis of influenza from Nasopharyngeal swab specimens and should not be used as a sole basis for treatment. Nasal washings and aspirates are unacceptable for Xpert Xpress SARS-CoV-2/FLU/RSV testing.  Fact Sheet for Patients: EntrepreneurPulse.com.au  Fact Sheet for Healthcare Providers: IncredibleEmployment.be  This test is not yet approved or cleared by the [place] FDA and has been authorized for detection and/or diagnosis of SARS-CoV-2 by FDA under an Emergency Use  Authorization (EUA). This EUA will remain in effect (meaning this test can be used) for the duration of the COVID-19 declaration under Section 564(b)(1) of the Act, 21 U.S.C. section 360bbb-3(b)(1), unless the authorization is terminated or revoked.  Performed at [facility], [address], [place],  [address]      Radiological Exams on Admission: DG Chest 2 View  Result Date: 08/01/2021 CLINICAL DATA:  Shortness of breath. EXAM: CHEST - 2 VIEW COMPARISON:  June 08, 2021. FINDINGS: The heart size and mediastinal contours are within normal limits. Hypoinflation of the lungs is noted with mild bibasilar subsegmental atelectasis. No pneumothorax is noted. The visualized skeletal structures are  unremarkable. IMPRESSION: Hypoinflation of the lungs is noted with mild bibasilar subsegmental atelectasis. Electronically Signed   By: Marijo Conception M.D.   On: 08/01/2021 15:58   DG Tibia/Fibula Right  Result Date: 08/01/2021 CLINICAL DATA:  Pain, no known injury EXAM: RIGHT TIBIA AND FIBULA - 2 VIEW COMPARISON:  None. FINDINGS: There is no evidence of fracture or other focal bone lesions. Diffuse soft tissue edema about the lower leg and ankle. IMPRESSION: No fracture or dislocation of the right tibia or fibula. Diffuse soft tissue edema about the lower leg and ankle. Electronically Signed   By: Eddie Candle M.D.   On: 08/01/2021 17:03   US Venous Img Lower Unilateral Right  Result Date: 08/01/2021 CLINICAL DATA:  Right lower extremity swelling EXAM: RIGHT LOWER EXTREMITY VENOUS DOPPLER ULTRASOUND TECHNIQUE: Gray-scale sonography with compression, as well as color and duplex ultrasound, were performed to evaluate the deep venous system(s) from the level of the common femoral vein through the popliteal and proximal calf veins. COMPARISON:  None. FINDINGS: VENOUS Normal compressibility of the common femoral, superficial femoral, and popliteal veins, as well as the visualized calf veins. Visualized portions of profunda femoral vein and great saphenous vein unremarkable. No filling defects to suggest DVT on grayscale or color Doppler imaging. Doppler waveforms show normal direction of venous flow, normal respiratory plasticity and response to augmentation. Limited views of the contralateral common femoral vein are unremarkable. OTHER None. Limitations: none IMPRESSION: Negative. Electronically Signed   By: Ulyses Jarred M.D.   On: 08/01/2021 19:05   DG Knee Complete 4 Views Right  Result Date: 08/01/2021 CLINICAL DATA:  Pain, no known injury EXAM: RIGHT KNEE - COMPLETE 4+ VIEW COMPARISON:  None. FINDINGS: No fracture or dislocation of the right knee. Mild tricompartmental joint space narrowing and  osteophytosis, worst in the medial compartment. No knee joint effusion. Soft tissues are unremarkable. IMPRESSION: 1.  No fracture or dislocation of the right knee. 2. Mild tricompartmental joint space narrowing and osteophytosis, worst in the medial compartment. Electronically Signed   By: Eddie Candle M.D.   On: 08/01/2021 17:04   DG Hip Unilat W or Wo Pelvis 2-3 Views Right  Result Date: 08/01/2021 CLINICAL DATA:  Right hip pain, no known injury EXAM: DG HIP (WITH OR WITHOUT PELVIS) 2-3V RIGHT COMPARISON:  None. FINDINGS: Osteopenia. There is no evidence of displaced hip fracture or dislocation. Hip joint spaces are well preserved. Nonobstructive pattern of overlying bowel gas. IMPRESSION: 1.  Osteopenia. No evidence of displaced fracture or dislocation. 2.  Hip joint spaces are well preserved. Electronically Signed   By: Eddie Candle M.D.   On: 08/01/2021 17:03      Assessment/Plan Principal Problem:   COPD (chronic obstructive pulmonary disease) (HCC) Active Problems:   Anxiety and depression   Type 2 diabetes mellitus  with stage 3b chronic kidney disease, without long-term current use of insulin (HCC)   Hypothyroidism   Anemia   Hypertension   Bilateral hearing loss   Bronchiectasis (HCC)   History of deep vein thrombosis (DVT) of lower extremity   Chronic kidney disease, stage 3b (HCC)     #1 acute exacerbation COPD: Appears to be mild exacerbation.  Patient will be admitted.  Steroids nebulizers and antibiotics will be given.  Continue with oxygen and titrate off.  #2 right lower extremity pain: Appears to be neuropathic in nature.  Resume her gabapentin.  Some morphine.  May adjust the dose of the gabapentin.  #3 history of DVT: On chronic anticoagulation with Eliquis.  Previous IVC filter.  Continue Eliquis  #4 bilateral hearing loss: Patient communicates with writing on the paper.  Family is supportive.  #5 hypothyroidism: Continue with levothyroxine.  #6 essential  hypertension: Continue home regimen.  #7 anemia of chronic disease: Stable at baseline.  #8 chronic insomnia: Uses melatonin from home and will continue.   DVT prophylaxis: Eliquis Code Status: DNR Family Communication: Daughter Disposition Plan: Home Consults called: None Admission status: Inpatient  Severity of Illness: The appropriate patient status for this patient is INPATIENT. Inpatient status is judged to be reasonable and necessary in order to provide the required intensity of service to ensure the patient's safety. The patient's presenting symptoms, physical exam findings, and initial radiographic and laboratory data in the context of their chronic comorbidities is felt to place them at high risk for further clinical deterioration. Furthermore, it is not anticipated that the patient will be medically stable for discharge from the hospital within 2 midnights of admission. The following factors support the patient status of inpatient.   " The patient's presenting symptoms include shortness of breath with pain in the right side. " The worrisome physical exam findings include mild expiratory wheezing. " The initial radiographic and laboratory data are worrisome because of no acute findings. " The chronic co-morbidities include chronic obstructive pulmonary disease.   * I certify that at the point of admission it is my clinical judgment that the patient will require inpatient hospital care spanning beyond 2 midnights from the point of admission due to high intensity of service, high risk for further deterioration and high frequency of surveillance required.Barbette Merino MD [facility] Pager [phone]  If 7PM-7AM, please contact night-coverage [email or web address] Password [facility]  08/01/2021, 10:57 PM

## 2021-08-01 NOTE — ED Notes (Signed)
Pt given blanket per request.  

## 2021-08-01 NOTE — ED Notes (Signed)
Pt BIB caregiver and daughter for right knee/leg pain. Pt deaf, daughter states she has not falllen but has such pain in the right leg that she cannot lift or ambulate. Daughter also states pt has trigeminal neuralgia and has not been wearing her [place] with O2 at night because the pain to her face from the cannula. Pt 93% RA, when turned to 2LPM, 100%. No breathing distress noted, slightly tachypnic.

## 2021-08-01 NOTE — ED Provider Notes (Signed)
[facility] Emergency Department Provider Note  ____________________________________________   Event Date/Time   First MD Initiated Contact with Patient 08/01/21 1534     (approximate)  I have reviewed the triage vital signs and the nursing notes.   HISTORY  Chief Complaint Shortness of Breath    HPI Jessica Erickson is a 87 y.o. female with diabetes,, prior blood clots on Eliquis with IVC filter comes in with concerns for leg pain.  Patient reporting right knee pain and makes it hard for her to lift her leg up.  This is been going on intermittently for a month but the caregiver stated that it initially was in the left leg and now she is having pain in the right leg.  She states that she is been working with PT and was able to do the exercises on Friday but now she is having more difficulty standing up due to feeling like she pulled a muscle. Pain happened after using leg on sewing machine. Patient is also supposed to be on 2 L of oxygen at baseline sometimes refuses to use. Pt was 92% with EMS with wheezing and Patient got albuterol and DuoNeb with EMS and oxygen up to 98 to 100%.  Patient is placed back onto her oxygen here and her oxygen levels are normal.\  Patient is very hard of hearing therefore I had to write things back-and-forth with her therefore history is limited due to her difficulty with hearing.  Although patient herself is denying shortness of breath and mostly focusing on the Right leg pain.   Caregiver that is by her states that she has not had any falls.    Past Medical History:  Diagnosis Date   Acoustic neuroma (HCC)    Allergy    Asthma    Bilateral swelling of feet    and legs   Bladder infection    CAD (coronary artery disease)    Cataract    Change in voice    Compression fracture of body of thoracic vertebra (HCC)    T12 09/18/15 MRI s/p fall    Constipation    COPD (chronic obstructive pulmonary disease) (HCC)    previous  CXR with chronic interstitial lung dz    CVA (cerebral vascular accident) ([place])    Depression    Diabetes ([place])    with neuropathy   Diabetes mellitus, type 2 ([place])    Diarrhea    Double vision    DVT (deep venous thrombosis) ([place])    right leg 10/2015 was on coumadin off as of 2017/2018 ; s/p IVC filter   Enuresis    Eye pain, right    Fall    Fatty liver    09/15/15 also mildly dilated pancreatitic duct rec MRCP small sub cm cyst hemangioma speeln mild right hydronephrorossi and prox. hydroureter, kidney stones, mild scarring kidneys   Female stress incontinence    Flank pain    GERD (gastroesophageal reflux disease)    with small hiatal hernia    Hard of hearing    Heart disease    History of kidney problems    Hyperlipidemia    mixed   Hypertension    Hypothyroidism, postsurgical    Impaired mobility and ADLs    uses rolling walker has caretaker 24/7 at home   Leg edema    Mixed incontinence urge and stress (female)(female)    Neuropathy    Osteoarthritis    DDD spine    Osteoporosis with fracture  T12 compression fracture   Photophobia    Pulmonary embolism ([place])    10/2015 off coumadin as of 04/2016   Pulmonary HTN ([place])    mild pulm HTN, echo 10/09/15 EF 27-06%CBJSE 1 dd, RV systolic pressure increased    Recurrent UTI    Sinus pressure    Skin cancer    BCC jawline and scalp    Thyroid disease    follows [facility]   TIA (transient ischemic attack)    MRI 2009/2010 neg stroke    Trigeminal neuralgia    Dr. Tomi Bamberger s/p gamma knife x 2, on Tegretol since 2011/2012 no increase in dose >200 mg bid rec per family per neurology    Urinary frequency    Urinary, incontinence, stress female    Dr Erlene Quan urology     Patient Active Problem List   Diagnosis Date Noted   Prediabetes 06/29/2021   Pulmonary artery hypertension ([place]) 05/03/2021   Stage 4 chronic kidney disease ([place]) 03/29/2021   Grade I diastolic dysfunction 83/15/1761   Obesity (BMI 30-39.9)  08/16/2020   Chronic kidney disease, stage 3b ([place]) 04/06/2020   Thrombocytopenia ([place]) 04/06/2020   Seborrheic keratoses 04/06/2020   Hip pain 09/30/2019   History of deep vein thrombosis (DVT) of lower extremity 09/30/2019   Arthritis 09/30/2019   Fall 03/11/2019   Epistaxis 12/11/2018   History of DVT (deep vein thrombosis) 11/12/2018   Bilateral leg edema 06/04/2018   Bilateral hearing loss 06/04/2018   Actinic keratosis 06/04/2018   Bronchiectasis ([place]) 06/04/2018   Depression 05/17/2018   Dehydration 05/15/2018   Hypertension 05/03/2018   Lymphedema 05/02/2018   Iron deficiency anemia 05/02/2018   Physical deconditioning 05/02/2018   Drug interaction 05/02/2018   Supratherapeutic INR 05/02/2018   Presence of IVC filter 05/02/2018   Anemia 04/11/2018   Fatigue 04/11/2018   Moderate protein-calorie malnutrition ([place]) 03/22/2018   Orthostasis 03/05/2018   Dizziness 03/04/2018   COPD (chronic obstructive pulmonary disease) ([place]) 12/13/2017   Anxiety and depression 12/13/2017   Trigeminal neuralgia 12/13/2017   GERD (gastroesophageal reflux disease) 12/13/2017   Type 2 diabetes mellitus with stage 3b chronic kidney disease, without long-term current use of insulin ([place]) 12/13/2017   Constipation 12/13/2017   Insomnia 12/13/2017   Hypothyroidism 12/13/2017   UTI (urinary tract infection) 10/08/2015   Low back pain 10/08/2015   Collapsed vertebra, not elsewhere classified, thoracic region, initial encounter for fracture ([place]) 09/20/2015   Basal cell carcinoma of scalp 04/06/2014   Fothergill's neuralgia 08/05/2013   Bladder infection, chronic 02/11/2013   Female genuine stress incontinence 02/11/2013   Incomplete bladder emptying 02/11/2013   Intrinsic sphincter deficiency 02/11/2013   Mixed incontinence 02/11/2013   Excessive urination at night 02/11/2013   Bladder retention 02/11/2013   FOM (frequency of micturition) 02/11/2013   Basal cell carcinoma of face  09/13/2011    Past Surgical History:  Procedure Laterality Date   APPENDECTOMY     as a child, open   [place] removal 1996    brain tumor surgery     BREAST SURGERY     breast bx   CATARACT EXTRACTION     CHOLECYSTECTOMY     EYE SURGERY     cataract   IVC FILTER PLACEMENT ([place] HX)     Dr. Lucky Cowboy 10/2015    LAPAROSCOPIC TUBAL LIGATION     MOHS SURGERY     scalp 04/2014    PERIPHERAL VASCULAR CATHETERIZATION N/A 10/11/2015  Procedure: IVC Filter Insertion;  Surgeon: Algernon Huxley, MD;  Location: [facility];  Service: Cardiovascular;  Laterality: N/A;   PERIPHERAL VASCULAR THROMBECTOMY Bilateral 03/29/2018   Procedure: PERIPHERAL VASCULAR THROMBECTOMY;  Surgeon: Algernon Huxley, MD;  Location: [facility];  Service: Cardiovascular;  Laterality: Bilateral;   PUBOVAGINAL SLING     THROAT SURGERY     THYROID SURGERY     tumor around vocal cords    TOOTH EXTRACTION     winter 2018    TOTAL THYROIDECTOMY  1976    Prior to Admission medications   Medication Sig Start Date End Date Taking? Authorizing Provider  acetaminophen (TYLENOL) 325 MG tablet Take 2 tablets (650 mg total) by mouth every 6 (six) hours as needed. 07/30/20   McLean-Scocuzza, Nino Glow, MD  acetaminophen-codeine (TYLENOL #3) 300-30 MG tablet Take 1 tablet by mouth 2 (two) times daily as needed for moderate pain. 06/28/21   McLean-Scocuzza, Nino Glow, MD  albuterol (PROVENTIL) (2.5 MG/3ML) 0.083% nebulizer solution Take 3 mLs (2.5 mg total) by nebulization every 6 (six) hours as needed for wheezing or shortness of breath. 05/05/21   Chesley Mires, MD  apixaban (ELIQUIS) 2.5 MG TABS tablet Take 1 tablet (2.5 mg total) by mouth 2 (two) times daily. 03/29/21   McLean-Scocuzza, Nino Glow, MD  atorvastatin (LIPITOR) 40 MG tablet Take 1 tablet (40 mg total) by mouth daily at 6 PM. Generic ok 03/29/21   McLean-Scocuzza, Nino Glow, MD  Azelaic Acid 15 % gel Apply 1 application topically 2 (two) times daily.  After skin is thoroughly washed and patted dry, gently but thoroughly massage a thin film of azelaic acid cream into the affected area twice daily, in the morning and evening. 03/29/21   McLean-Scocuzza, Nino Glow, MD  BISACODYL PO Take by mouth.    [provider]  Calcium Carbonate-Vitamin D (CALCIUM 600+D) 600-400 MG-UNIT tablet Take 1 tablet by mouth 2 (two) times daily. Lunch and dinner 07/23/19   McLean-Scocuzza, Nino Glow, MD  cetirizine (ZYRTEC) 10 MG tablet Take 1 tablet (10 mg total) by mouth daily. 03/29/21   McLean-Scocuzza, Nino Glow, MD  clotrimazole-betamethasone (LOTRISONE) lotion SMARTSIG:In Ear(s) 10/11/20   [provider]  Docusate Sodium (DOK PO) Take by mouth.    [provider]  Fluticasone-Umeclidin-Vilant (TRELEGY ELLIPTA) 100-62.5-25 MCG/INH AEPB Inhale 1 puff into the lungs daily. Rinse mouth d/c symbicort and spiriva 03/29/21   McLean-Scocuzza, Nino Glow, MD  furosemide (LASIX) 40 MG tablet Qd and Take 2nd dose at lunch 40 mg if weight increased 4 to 5 lbs each day 06/28/21   McLean-Scocuzza, Nino Glow, MD  gabapentin (NEURONTIN) 400 MG capsule Take 1 capsule (400 mg total) by mouth 3 (three) times daily. 11/11/20   McLean-Scocuzza, Nino Glow, MD  guaiFENesin (MUCINEX) 600 MG 12 hr tablet Take 2 tablets (1,200 mg total) by mouth 2 (two) times daily as needed for cough or to loosen phlegm. 05/05/21   Chesley Mires, MD  hydrocortisone 2.5 % cream Apply topically 2 (two) times daily. 04/06/20   McLean-Scocuzza, Nino Glow, MD  levothyroxine (SYNTHROID) 200 MCG tablet Take 1 tablet (200 mcg total) by mouth daily before breakfast. Except on Sunday. Do not take with other medications or vitamins 03/29/21   McLean-Scocuzza, Nino Glow, MD  Melatonin 10 MG TABS Take 10 mg by mouth at bedtime. 04/19/21   McLean-Scocuzza, Nino Glow, MD  mirtazapine (REMERON) 15 MG tablet Take 1 tablet (15 mg total) by mouth at bedtime.  03/29/21   McLean-Scocuzza, Nino Glow, MD  Multiple Vitamin (MULTIVITAMIN  WITH MINERALS) TABS tablet Take 1 tablet by mouth daily.     [provider]  mupirocin ointment (BACTROBAN) 2 % Apply 1 application topically 3 (three) times daily. Right thumb 03/29/21   McLean-Scocuzza, Nino Glow, MD  pantoprazole (PROTONIX) 40 MG tablet Take 1 tablet (40 mg total) by mouth daily. 30 minutes before lunch or dinner 03/29/21   McLean-Scocuzza, Nino Glow, MD  polyethylene glycol powder (GLYCOLAX/MIRALAX) 17 GM/SCOOP powder Take 17 g by mouth daily. 04/06/20   McLean-Scocuzza, Nino Glow, MD  polyvinyl alcohol (LIQUIFILM TEARS) 1.4 % ophthalmic solution Place 1 drop into both eyes as needed for dry eyes. 04/06/20   McLean-Scocuzza, Nino Glow, MD    Allergies Penicillins, Sulfa antibiotics, Amitiza [lubiprostone], Aspirin, and Penicillin g  Family History  Problem Relation Age of Onset   Heart disease Mother    Diabetes Father    Cancer Daughter        breast ca x 2 s/p mastectomy     Social History Social History   Tobacco Use   Smoking status: Former    Packs/day: 0.50    Years: 20.00    Pack years: 10.00    Types: Cigarettes    Quit date: 09/20/1995    Years since quitting: 25.8   Smokeless tobacco: Never   Tobacco comments:    quit 1996 smoked 20 years max 8 cig qd   Vaping Use   Vaping Use: Never used  Substance Use Topics   Alcohol use: No   Drug use: No      Review of Systems Constitutional: No fever/chills Eyes: No visual changes. ENT: No sore throat. Cardiovascular: Denies chest pain. Respiratory: shortness of breath  Gastrointestinal: No abdominal pain.  No nausea, no vomiting.  No diarrhea.  No constipation. Genitourinary: Negative for dysuria. Musculoskeletal: Right leg pain Skin: Negative for rash. Neurological: Negative for headaches, focal weakness or numbness. All other ROS negative ____________________________________________   PHYSICAL EXAM:  VITAL SIGNS: ED Triage Vitals  Enc Vitals Group     BP 08/01/21 1542 (!) 110/57      Pulse Rate 08/01/21 1542 71     Resp 08/01/21 1541 20     Temp 08/01/21 1541 97.7 F (36.5 C)     Temp Source 08/01/21 1541 Oral     SpO2 08/01/21 1542 93 %     Weight --      Height --      Head Circumference --      Peak Flow --      Pain Score 08/01/21 1514 0     Pain Loc --      Pain Edu? --      Excl. in [place]? --     Constitutional: Alert and oriented. Well appearing and in no acute distress.  Hard of hearing therefore I need to write to her. Eyes: Conjunctivae are normal. EOMI. Head: Atraumatic. Nose: No congestion/rhinnorhea. Mouth/Throat: Mucous membranes are moist.   Neck: No stridor. Trachea Midline. FROM Cardiovascular: Normal rate, regular rhythm. Grossly normal heart sounds.  Good peripheral circulation. Respiratory: Normal respiratory effort.  No retractions. Lungs CTAB. Gastrointestinal: Soft and nontender. No distention. No abdominal bruits.  Musculoskeletal: Patient reports tenderness on the right knee, right shin.  Patient having pain trying to lift up the right leg.  2+ distal pulse.  Some edema noted to both bilateral legs.  Patient denies any pain on the left leg is able  to lift the leg off the bed.  There is no pain in the right hip.  At times she is able to lift the leg up slightly off the bed on the right but she just states that she is having a lot of pain in the calf, shin area.  She is able to dorsiflex and plantarflex her ankle.  She is not tender in her ankle or her foot. Neurologic:  Normal speech and language. No gross focal neurologic deficits are appreciated.  Skin:  Skin is warm, dry and intact. No rash noted. Psychiatric: Mood and affect are normal. Speech and behavior are normal. GU: Deferred   ____________________________________________   LABS (all labs ordered are listed, but only abnormal results are displayed)  Labs Reviewed  RESP PANEL BY RT-PCR (FLU A&B, COVID) ARPGX2  CBC  BASIC METABOLIC PANEL  HEPATIC FUNCTION PANEL  BRAIN  NATRIURETIC PEPTIDE  TROPONIN I (HIGH SENSITIVITY)   ____________________________________________   ED ECG REPORT I, Vanessa [place], the attending physician, personally viewed and interpreted this ECG.  Normal sinus rate of 72, no ST elevation, no T wave inversion except for aVL, normal intervals ____________________________________________  RADIOLOGY Robert Bellow, personally viewed and evaluated these images (plain radiographs) as part of my medical decision making, as well as reviewing the written report by the radiologist.  ED MD interpretation: No obvious pneumonia  Official radiology report(s): DG Chest 2 View  Result Date: 08/01/2021 CLINICAL DATA:  Shortness of breath. EXAM: CHEST - 2 VIEW COMPARISON:  June 08, 2021. FINDINGS: The heart size and mediastinal contours are within normal limits. Hypoinflation of the lungs is noted with mild bibasilar subsegmental atelectasis. No pneumothorax is noted. The visualized skeletal structures are unremarkable. IMPRESSION: Hypoinflation of the lungs is noted with mild bibasilar subsegmental atelectasis. Electronically Signed   By: Marijo Conception M.D.   On: 08/01/2021 15:58    ____________________________________________   PROCEDURES  Procedure(s) performed (including Critical Care):  Procedures   ____________________________________________   INITIAL IMPRESSION / ASSESSMENT AND PLAN / ED COURSE  Jessica Erickson was evaluated in Emergency Department on 08/01/2021 for the symptoms described in the history of present illness. She was evaluated in the context of the global COVID-19 pandemic, which necessitated consideration that the patient might be at risk for infection with the SARS-CoV-2 virus that causes COVID-19. Institutional protocols and algorithms that pertain to the evaluation of patients at risk for COVID-19 are in a state of rapid change based on information released by regulatory bodies including the CDC and federal and state  organizations. These policies and algorithms were followed during the patient's care in the ED.    Patient is base concern is difficulty with ambulating.  Does not like she had any falls we will get some x-rays to make sure no pathological fracture.  Patient had some pain on her left side previously and x-rays there had been negative at her PCP.  Patient's been taking codeine with Tylenol without much effect.  Patient is also due for her gabapentin for her known trigeminal neuralgia and adamant that she needs to get it here.  There was some concern for may be shortness of breath but patient is been noncompliant with her oxygen so does not seem to be as much of an issue when her oxygen is on.  She denies any shortness of breath to me. Will however get labs to make sure no evidence of ACS, chest x-ray evaluate for pneumonia get COVID swab given  the history is somewhat limited.  X-rays were negative and I reviewed her prior ultrasound did not show any signs of blood clot previously so we will get ultrasound to see if there is any reaccumulation of blood clot.  Work-up is reassuring.  On my reassessment we tried to ambulate patient and she is still having significant pain in her right thigh.  Suspect this more likely a muscle pull.  Patient is unable to ambulate and appears very dyspneic even on her oxygen.  On my repeat assessment she now has some wheezing.  Is most likely had resolved after the DuoNeb earlier but given the wheezing now we will give another DuoNeb and a dose of steroids.  Discussed further with family do not feel if she is safe to go home and high risk for fall.  Will discuss with hospital team for admission for her COPD as well as her right leg pain         ____________________________________________   FINAL CLINICAL IMPRESSION(S) / ED DIAGNOSES   Final diagnoses:  COPD exacerbation ([place])  Right leg pain      MEDICATIONS GIVEN DURING THIS VISIT:  Medications  lidocaine  (LIDODERM) 5 % 1 patch (1 patch Transdermal Patch Applied 08/01/21 1652)  ipratropium-albuterol (DUONEB) 0.5-2.5 (3) MG/3ML nebulizer solution 3 mL (has no administration in time range)  gabapentin (NEURONTIN) capsule 400 mg (400 mg Oral Given 08/01/21 1650)  oxyCODONE (Oxy IR/ROXICODONE) immediate release tablet 2.5 mg (2.5 mg Oral Given 08/01/21 1652)  acetaminophen (TYLENOL) tablet 1,000 mg (1,000 mg Oral Given 08/01/21 1741)  methylPREDNISolone sodium succinate (SOLU-MEDROL) 125 mg/2 mL injection 125 mg (125 mg Intravenous Given 08/01/21 2016)     ED Discharge Orders     None        Note:  This document was prepared using Dragon voice recognition software and may include unintentional dictation errors.    Vanessa Napan[place], MD 08/01/21 2116

## 2021-08-01 NOTE — ED Notes (Signed)
Family updated as to patient's status.

## 2021-08-01 NOTE — ED Notes (Signed)
Pt given crackers and graham crackers per request

## 2021-08-01 NOTE — ED Notes (Signed)
Patient is resting comfortably. 

## 2021-08-01 NOTE — ED Notes (Signed)
Pt return from XR.

## 2021-08-01 NOTE — Telephone Encounter (Signed)
Tonya calling in from home health occupational therapy. States that her visit with the Patient today was canceled as the Patient's daughter states they were calling EMS due to Patient having severe right leg pain.   For your information

## 2021-08-01 NOTE — ED Triage Notes (Addendum)
Pt comes via EMs from home with c/o right knee pain and SOb. Pt is deaf. EMS reports when they arrived on scene pt was wheezing. Pt is suppose to wear 2L [place] but has refused that and her treatments the last few days. Pt does have COPD. Initial O2 was 91%RA.  EMS gave pt 1 albuterol and 1 dunoneb with improvement of O2 to 98-100%  BP-124/80 HR-62 CBG-151 T-98.6

## 2021-08-01 NOTE — ED Notes (Signed)
This writer Helped pct  from triage to assist patient in bed , pt in pain.

## 2021-08-01 NOTE — ED Notes (Signed)
US at bedside

## 2021-08-01 NOTE — ED Notes (Signed)
Patient transported to X-ray 

## 2021-08-01 NOTE — ED Notes (Signed)
Went into room to help other (primary ) Rn assist in starting a IV , took iv cart in room , visitor of patient asked this writer if he was in a bad mood. This Probation officer replied "no I am  actually in a good mood", the visitor stated you seem to be in a bad mood. This Probation officer decided to step out and not assist primary RN with helping place IV due to visitors behavior and defensive manner.

## 2021-08-02 ENCOUNTER — Encounter: Payer: Self-pay | Admitting: Internal Medicine

## 2021-08-02 DIAGNOSIS — J471 Bronchiectasis with (acute) exacerbation: Secondary | ICD-10-CM

## 2021-08-02 DIAGNOSIS — E1122 Type 2 diabetes mellitus with diabetic chronic kidney disease: Secondary | ICD-10-CM

## 2021-08-02 DIAGNOSIS — J441 Chronic obstructive pulmonary disease with (acute) exacerbation: Principal | ICD-10-CM

## 2021-08-02 DIAGNOSIS — N1832 Chronic kidney disease, stage 3b: Secondary | ICD-10-CM

## 2021-08-02 LAB — CBC
HCT: 40.5 % (ref 36.0–46.0)
Hemoglobin: 13.1 g/dL (ref 12.0–15.0)
MCH: 30.1 pg (ref 26.0–34.0)
MCHC: 32.3 g/dL (ref 30.0–36.0)
MCV: 93.1 fL (ref 80.0–100.0)
Platelets: 180 10*3/uL (ref 150–400)
RBC: 4.35 MIL/uL (ref 3.87–5.11)
RDW: 13.9 % (ref 11.5–15.5)
WBC: 3.6 10*3/uL — ABNORMAL LOW (ref 4.0–10.5)
nRBC: 0 % (ref 0.0–0.2)

## 2021-08-02 LAB — COMPREHENSIVE METABOLIC PANEL
ALT: 16 U/L (ref 0–44)
AST: 22 U/L (ref 15–41)
Albumin: 3.2 g/dL — ABNORMAL LOW (ref 3.5–5.0)
Alkaline Phosphatase: 61 U/L (ref 38–126)
Anion gap: 7 (ref 5–15)
BUN: 20 mg/dL (ref 8–23)
CO2: 29 mmol/L (ref 22–32)
Calcium: 8.4 mg/dL — ABNORMAL LOW (ref 8.9–10.3)
Chloride: 102 mmol/L (ref 98–111)
Creatinine, Ser: 1.2 mg/dL — ABNORMAL HIGH (ref 0.44–1.00)
GFR, Estimated: 42 mL/min — ABNORMAL LOW (ref >60)
Glucose, Bld: 242 mg/dL — ABNORMAL HIGH (ref 70–99)
Potassium: 3.9 mmol/L (ref 3.5–5.1)
Sodium: 138 mmol/L (ref 135–145)
Total Bilirubin: 0.6 mg/dL (ref 0.3–1.2)
Total Protein: 6.5 g/dL (ref 6.5–8.1)

## 2021-08-02 LAB — GLUCOSE, CAPILLARY
Glucose-Capillary: 218 mg/dL — ABNORMAL HIGH (ref 70–99)
Glucose-Capillary: 233 mg/dL — ABNORMAL HIGH (ref 70–99)
Glucose-Capillary: 195 mg/dL — ABNORMAL HIGH (ref 70–99)

## 2021-08-02 LAB — TROPONIN I (HIGH SENSITIVITY): Troponin I (High Sensitivity): 7 ng/L (ref <18)

## 2021-08-02 MED ORDER — UMECLIDINIUM BROMIDE 62.5 MCG/INH IN AEPB
1.0000 | INHALATION_SPRAY | Freq: Every day | RESPIRATORY_TRACT | Status: DC
  Administered 2021-08-02 – 2021-08-05 (×4): 1 via RESPIRATORY_TRACT
  Filled 2021-08-02: qty 7

## 2021-08-02 MED ORDER — POLYETHYLENE GLYCOL 3350 17 G PO PACK
17.0000 g | PACK | Freq: Every day | ORAL | Status: DC
  Administered 2021-08-02 – 2021-08-05 (×3): 17 g via ORAL
  Filled 2021-08-02 (×4): qty 1

## 2021-08-02 MED ORDER — GLUCERNA SHAKE PO LIQD
237.0000 mL | Freq: Two times a day (BID) | ORAL | Status: DC
  Administered 2021-08-02 – 2021-08-05 (×5): 237 mL via ORAL

## 2021-08-02 MED ORDER — FLUTICASONE FUROATE-VILANTEROL 100-25 MCG/INH IN AEPB
1.0000 | INHALATION_SPRAY | Freq: Every day | RESPIRATORY_TRACT | Status: DC
  Administered 2021-08-02 – 2021-08-05 (×4): 1 via RESPIRATORY_TRACT
  Filled 2021-08-02: qty 28

## 2021-08-02 MED ORDER — ACETAMINOPHEN 325 MG PO TABS: 650.0000 mg | ORAL_TABLET | Freq: Four times a day (QID) | ORAL | Status: DC | PRN

## 2021-08-02 MED ORDER — ACETAMINOPHEN 325 MG PO TABS
650.0000 mg | ORAL_TABLET | Freq: Four times a day (QID) | ORAL | Status: DC | PRN
  Administered 2021-08-02 – 2021-08-04 (×2): 650 mg via ORAL
  Filled 2021-08-02: qty 2

## 2021-08-02 MED ORDER — ACETAMINOPHEN 325 MG PO TABS
ORAL_TABLET | ORAL | Status: AC
  Filled 2021-08-02: qty 2

## 2021-08-02 MED ORDER — IPRATROPIUM-ALBUTEROL 0.5-2.5 (3) MG/3ML IN SOLN
3.0000 mL | Freq: Three times a day (TID) | RESPIRATORY_TRACT | Status: DC
  Administered 2021-08-02 – 2021-08-05 (×7): 3 mL via RESPIRATORY_TRACT
  Filled 2021-08-02 (×9): qty 3

## 2021-08-02 NOTE — Progress Notes (Signed)
PROGRESS NOTE    Jessica Erickson   YIF:[id number]  DOB: February 18, 1926  PCP: McLean-Scocuzza, Nino Glow, MD    DOA: 08/01/2021 LOS: 1    Brief Narrative / Hospital Course to Date:   87 year old female with past medical history of acoustic neuroma with bilateral loss of hearing, asthma, COPD, CAD, diabetes, peripheral neuropathy, history of DVT on Eliquis, recurrent falls, history of CVA, depression brought to the ED by her daughter with severe right lower extremity pain in addition to shortness of breath consistent with prior COPD exacerbations.  Admitted for further evaluation management.  Started on IV steroids and nebulizers for acute exacerbation of COPD. She is requiring 2 L/min oxygen.    Assessment & Plan   Principal Problem:   COPD (chronic obstructive pulmonary disease) (HCC) Active Problems:   Anxiety and depression   Type 2 diabetes mellitus with stage 3b chronic kidney disease, without long-term current use of insulin (HCC)   Hypothyroidism   Anemia   Hypertension   Bilateral hearing loss   Bronchiectasis (HCC)   History of deep vein thrombosis (DVT) of lower extremity   Chronic kidney disease, stage 3b (HCC)   COPD with acute exacerbation -today patient has very diminished breath sounds and diffuse expiratory wheezing on exam. Continue IV Solu-Medrol Continue doxycycline Scheduled DuoNebs Incruse and Breo As needed albuterol Scheduled Mucinex  Chronic respiratory failure -patient reportedly has home O2 but does not typically use it regularly as tubing causes her facial pain. Supplemental O2 as needed to maintain sats above 88%  Right lower extremity pain -present on admission. Today patient reports leg is feeling much better. Continue gabapentin, adjust if needed for efficacy As needed analgesics  History of DVT -on chronic Eliquis, continued.  Previous IVC filter.  Bilateral hearing loss due to acoustic neuroma -communication with patient by writing on  paper.  Hypothyroidism -continue levothyroxine  Hypertension -continued on home regimen  Hyperlipidemia -continue Lipitor  Anemia of chronic disease -hemoglobin stable.  Monitor CBC.  Chronic insomnia -continued on home melatonin  GERD -on PPI  Obesity: Body mass index is 35.08 kg/m.  Complicates overall care and prognosis.  Recommend lifestyle modifications including physical activity and diet for weight loss and overall long-term health.   DVT prophylaxis: apixaban (ELIQUIS) tablet 2.5 mg Start: 08/01/21 2300 apixaban (ELIQUIS) tablet 2.5 mg   Diet:  Diet Orders (From admission, onward)     Start     Ordered   08/01/21 2259  Diet Carb Modified Fluid consistency: Thin; Room service appropriate? Yes  Diet effective now       Question Answer Comment  Diet-HS Snack? Nothing   Calorie Level Medium 1600-2000   Fluid consistency: Thin   Room service appropriate? Yes      08/01/21 2258              Code Status: DNR   Subjective 08/02/21    Patient up in recliner chair seen just after eating lunch today.  She reports her right leg feeling much better, able to move it without pain today.  She reports no shortness of breath at rest but feels tight and wheezy.  Intermittent cough worse than baseline.  Denies fevers or chills.  No other acute complaints.  Says she has oxygen at home but does not use it because it makes her face hurt.   Disposition Plan & Communication   Status is: Inpatient  Remains inpatient appropriate because:IV treatments appropriate due to intensity of illness or inability to take  PO.  COPD exacerbation with ongoing diffuse wheezing continuing on IV steroids.  Dispo: The patient is from: Home              Anticipated d/c is to: Home              Patient currently is not medically stable to d/c.   Difficult to place patient No   Consults, Procedures, Significant Events   Consultants:  None  Procedures:  None  Antimicrobials:   Anti-infectives (From admission, onward)    Start     Dose/Rate Route Frequency Ordered Stop   08/01/21 2300  doxycycline (VIBRA-TABS) tablet 100 mg        100 mg Oral Every 12 hours 08/01/21 2255 08/06/21 2159         Micro    Objective   Vitals:   08/02/21 1131 08/02/21 1400 08/02/21 1424 08/02/21 1627  BP: (!) 129/56   (!) 111/45  Pulse: 68   66  Resp:    18  Temp: 98 F (36.7 C)   98.6 F (37 C)  TempSrc: Oral     SpO2: 100%  99% 93%  Weight:  101.6 kg      Intake/Output Summary (Last 24 hours) at 08/02/2021 1837 Last data filed at 08/02/2021 1422 Gross per 24 hour  Intake 686.04 ml  Output --  Net 686.04 ml   Filed Weights   08/02/21 1400  Weight: 101.6 kg    Physical Exam:  General exam: awake, alert, no acute distress HEENT: Hard of hearing, moist mucus membranes Respiratory system: Lungs very diminished with diffuse expiratory wheezing, normal respiratory effort. Cardiovascular system: normal S1/S2, RRR, b/l LE edema.   Gastrointestinal system: soft, NT, ND, no HSM felt, +bowel sounds. Central nervous system: no gross focal neurologic deficits, normal speech Skin: dry, intact, normal temperature Psychiatry: normal mood, congruent affect, judgement and insight appear normal  Labs   Data Reviewed: I have personally reviewed following labs and imaging studies  CBC: Recent Labs  Lab 08/01/21 1542 08/02/21 0413  WBC 7.3 3.6*  HGB 13.4 13.1  HCT 42.1 40.5  MCV 94.8 93.1  PLT 218 423   Basic Metabolic Panel: Recent Labs  Lab 08/01/21 1542 08/02/21 0413  NA 142 138  K 3.7 3.9  CL 100 102  CO2 34* 29  GLUCOSE 129* 242*  BUN 21 20  CREATININE 1.34* 1.20*  CALCIUM 8.7* 8.4*   GFR: Estimated Creatinine Clearance: 35.9 mL/min (A) (by C-G formula based on SCr of 1.2 mg/dL (H)). Liver Function Tests: Recent Labs  Lab 08/01/21 1542 08/02/21 0413  AST 25 22  ALT 17 16  ALKPHOS 68 61  BILITOT 0.7 0.6  PROT 6.9 6.5  ALBUMIN 3.5 3.2*    No results for input(s): LIPASE, AMYLASE in the last 168 hours. No results for input(s): AMMONIA in the last 168 hours. Coagulation Profile: No results for input(s): INR, PROTIME in the last 168 hours. Cardiac Enzymes: No results for input(s): CKTOTAL, CKMB, CKMBINDEX, TROPONINI in the last 168 hours. BNP (last 3 results) No results for input(s): PROBNP in the last 8760 hours. HbA1C: No results for input(s): HGBA1C in the last 72 hours. CBG: Recent Labs  Lab 08/01/21 1943 08/02/21 0834 08/02/21 1157 08/02/21 1626  GLUCAP 114* 218* 233* 195*   Lipid Profile: No results for input(s): CHOL, HDL, LDLCALC, TRIG, CHOLHDL, LDLDIRECT in the last 72 hours. Thyroid Function Tests: No results for input(s): TSH, T4TOTAL, FREET4, T3FREE, THYROIDAB in the last  72 hours. Anemia Panel: No results for input(s): VITAMINB12, FOLATE, FERRITIN, TIBC, IRON, RETICCTPCT in the last 72 hours. Sepsis Labs: No results for input(s): PROCALCITON, LATICACIDVEN in the last 168 hours.  Recent Results (from the past 240 hour(s))  Resp Panel by RT-PCR (Flu A&B, Covid) Nasopharyngeal Swab     Status: None   Collection Time: 08/01/21  4:35 PM   Specimen: Nasopharyngeal Swab; Nasopharyngeal(NP) swabs in vial transport medium  Result Value Ref Range Status   SARS Coronavirus 2 by RT PCR NEGATIVE NEGATIVE Final    Comment: (NOTE) SARS-CoV-2 target nucleic acids are NOT DETECTED.  The SARS-CoV-2 RNA is generally detectable in upper respiratory specimens during the acute phase of infection. The lowest concentration of SARS-CoV-2 viral copies this assay can detect is 138 copies/mL. A negative result does not preclude SARS-Cov-2 infection and should not be used as the sole basis for treatment or other patient management decisions. A negative result may occur with  improper specimen collection/handling, submission of specimen other than nasopharyngeal swab, presence of viral mutation(s) within the areas  targeted by this assay, and inadequate number of viral copies(<138 copies/mL). A negative result must be combined with clinical observations, patient history, and epidemiological information. The expected result is Negative.  Fact Sheet for Patients:  EntrepreneurPulse.com.au  Fact Sheet for Healthcare Providers:  IncredibleEmployment.be  This test is no t yet approved or cleared by the [place] FDA and  has been authorized for detection and/or diagnosis of SARS-CoV-2 by FDA under an Emergency Use Authorization (EUA). This EUA will remain  in effect (meaning this test can be used) for the duration of the COVID-19 declaration under Section 564(b)(1) of the Act, 21 U.S.C.section 360bbb-3(b)(1), unless the authorization is terminated  or revoked sooner.       Influenza A by PCR NEGATIVE NEGATIVE Final   Influenza B by PCR NEGATIVE NEGATIVE Final    Comment: (NOTE) The Xpert Xpress SARS-CoV-2/FLU/RSV plus assay is intended as an aid in the diagnosis of influenza from Nasopharyngeal swab specimens and should not be used as a sole basis for treatment. Nasal washings and aspirates are unacceptable for Xpert Xpress SARS-CoV-2/FLU/RSV testing.  Fact Sheet for Patients: EntrepreneurPulse.com.au  Fact Sheet for Healthcare Providers: IncredibleEmployment.be  This test is not yet approved or cleared by the [place] FDA and has been authorized for detection and/or diagnosis of SARS-CoV-2 by FDA under an Emergency Use Authorization (EUA). This EUA will remain in effect (meaning this test can be used) for the duration of the COVID-19 declaration under Section 564(b)(1) of the Act, 21 U.S.C. section 360bbb-3(b)(1), unless the authorization is terminated or revoked.  Performed at [facility], [address], [place], [place] [address]       Imaging Studies   DG Chest 2 View  Result Date:  08/01/2021 CLINICAL DATA:  Shortness of breath. EXAM: CHEST - 2 VIEW COMPARISON:  June 08, 2021. FINDINGS: The heart size and mediastinal contours are within normal limits. Hypoinflation of the lungs is noted with mild bibasilar subsegmental atelectasis. No pneumothorax is noted. The visualized skeletal structures are unremarkable. IMPRESSION: Hypoinflation of the lungs is noted with mild bibasilar subsegmental atelectasis. Electronically Signed   By: Marijo Conception M.D.   On: 08/01/2021 15:58   DG Tibia/Fibula Right  Result Date: 08/01/2021 CLINICAL DATA:  Pain, no known injury EXAM: RIGHT TIBIA AND FIBULA - 2 VIEW COMPARISON:  None. FINDINGS: There is no evidence of fracture or other focal bone lesions. Diffuse soft tissue  edema about the lower leg and ankle. IMPRESSION: No fracture or dislocation of the right tibia or fibula. Diffuse soft tissue edema about the lower leg and ankle. Electronically Signed   By: Eddie Candle M.D.   On: 08/01/2021 17:03   US Venous Img Lower Unilateral Right  Result Date: 08/01/2021 CLINICAL DATA:  Right lower extremity swelling EXAM: RIGHT LOWER EXTREMITY VENOUS DOPPLER ULTRASOUND TECHNIQUE: Gray-scale sonography with compression, as well as color and duplex ultrasound, were performed to evaluate the deep venous system(s) from the level of the common femoral vein through the popliteal and proximal calf veins. COMPARISON:  None. FINDINGS: VENOUS Normal compressibility of the common femoral, superficial femoral, and popliteal veins, as well as the visualized calf veins. Visualized portions of profunda femoral vein and great saphenous vein unremarkable. No filling defects to suggest DVT on grayscale or color Doppler imaging. Doppler waveforms show normal direction of venous flow, normal respiratory plasticity and response to augmentation. Limited views of the contralateral common femoral vein are unremarkable. OTHER None. Limitations: none IMPRESSION: Negative. Electronically  Signed   By: Ulyses Jarred M.D.   On: 08/01/2021 19:05   DG Knee Complete 4 Views Right  Result Date: 08/01/2021 CLINICAL DATA:  Pain, no known injury EXAM: RIGHT KNEE - COMPLETE 4+ VIEW COMPARISON:  None. FINDINGS: No fracture or dislocation of the right knee. Mild tricompartmental joint space narrowing and osteophytosis, worst in the medial compartment. No knee joint effusion. Soft tissues are unremarkable. IMPRESSION: 1.  No fracture or dislocation of the right knee. 2. Mild tricompartmental joint space narrowing and osteophytosis, worst in the medial compartment. Electronically Signed   By: Eddie Candle M.D.   On: 08/01/2021 17:04   DG Hip Unilat W or Wo Pelvis 2-3 Views Right  Result Date: 08/01/2021 CLINICAL DATA:  Right hip pain, no known injury EXAM: DG HIP (WITH OR WITHOUT PELVIS) 2-3V RIGHT COMPARISON:  None. FINDINGS: Osteopenia. There is no evidence of displaced hip fracture or dislocation. Hip joint spaces are well preserved. Nonobstructive pattern of overlying bowel gas. IMPRESSION: 1.  Osteopenia. No evidence of displaced fracture or dislocation. 2.  Hip joint spaces are well preserved. Electronically Signed   By: Eddie Candle M.D.   On: 08/01/2021 17:03     Medications   Scheduled Meds:  acetaminophen       apixaban  2.5 mg Oral BID   atorvastatin  40 mg Oral Daily   Azelaic Acid  1 application Topical BID   calcium-vitamin D  1 tablet Oral BID   clotrimazole-betamethasone  1 application Topical BID   docusate  50 mg Oral Daily   doxycycline  100 mg Oral Q12H   feeding supplement (GLUCERNA SHAKE)  237 mL Oral BID BM   umeclidinium bromide  1 puff Inhalation Daily   And   fluticasone furoate-vilanterol  1 puff Inhalation Daily   furosemide  40 mg Oral Daily   gabapentin  400 mg Oral TID   hydrocortisone  1 application Topical BID   insulin aspart  0-9 Units Subcutaneous TID WC   ipratropium-albuterol  3 mL Nebulization TID   levothyroxine  200 mcg Oral Once per day on  Mon Tue Wed Thu Fri Sat   lidocaine  1 patch Transdermal Q24H   loratadine  10 mg Oral Daily   melatonin  10 mg Oral QHS   methylPREDNISolone (SOLU-MEDROL) injection  40 mg Intravenous Q12H   Followed by   Derrill Memo ON 08/03/2021] predniSONE  40 mg Oral Q  breakfast   mirtazapine  15 mg Oral QHS   multivitamin with minerals  1 tablet Oral Daily   mupirocin ointment  1 application Topical TID   pantoprazole  40 mg Oral Daily   polyethylene glycol  17 g Oral Daily   Continuous Infusions:     LOS: 1 day    Time spent: 30 minutes    Ezekiel Slocumb, DO [facility]  08/02/2021, 6:37 PM      If 7PM-7AM, please contact night-coverage. How to contact the [facility] Attending or Consulting provider [place] or covering provider during after hours [place], for this patient?    Check the care team in [facility] and look for a) attending/consulting TR[facility] provider listed and b) the [facility] team listed Log into [email or web address] and use [place]'s universal password to access. If you do not have the password, please contact the hospital operator. Locate the [facility] provider you are looking for under [facility] and page to a number that you can be directly reached. If you still have difficulty reaching the provider, please page the [facility] (Director on Call) for the Hospitalists listed on amion for assistance.

## 2021-08-02 NOTE — NC FL2 (Signed)
[place] LEVEL OF CARE SCREENING TOOL     IDENTIFICATION  Patient Name: Jessica Erickson Birthdate: 06/25/1926 Sex: female Admission Date (Current Location): 08/01/2021  [place] and [place] Number:  Engineering geologist and Address:  [facility], [address] [place], [place] [address]      Provider Number: [id number]  Attending Physician Name and Address:  Ezekiel Slocumb, DO  Relative Name and Phone Number:  Travonna Swindle (son) [phone]    Current Level of Care: Hospital Recommended Level of Care: [place] Prior Approval Number:    Date Approved/Denied:   PASRR Number: [id number]  Discharge Plan: SNF    Current Diagnoses: Patient Active Problem List   Diagnosis Date Noted   Prediabetes 06/29/2021   Pulmonary artery hypertension ([place]) 05/03/2021   Stage 4 chronic kidney disease ([place]) 03/29/2021   Grade I diastolic dysfunction 25/95/6387   Obesity (BMI 30-39.9) 08/16/2020   Chronic kidney disease, stage 3b ([place]) 04/06/2020   Thrombocytopenia ([place] [address]) 04/06/2020   Seborrheic keratoses 04/06/2020   Hip pain 09/30/2019   History of deep vein thrombosis (DVT) of lower extremity 09/30/2019   Arthritis 09/30/2019   Fall 03/11/2019   Epistaxis 12/11/2018   History of DVT (deep vein thrombosis) 11/12/2018   Bilateral leg edema 06/04/2018   Bilateral hearing loss 06/04/2018   Actinic keratosis 06/04/2018   Bronchiectasis ([place]) 06/04/2018   Depression 05/17/2018   Dehydration 05/15/2018   Hypertension 05/03/2018   Lymphedema 05/02/2018   Iron deficiency anemia 05/02/2018   Physical deconditioning 05/02/2018   Drug interaction 05/02/2018   Supratherapeutic INR 05/02/2018   Presence of IVC filter 05/02/2018   Anemia 04/11/2018   Fatigue 04/11/2018   Moderate protein-calorie malnutrition ([place]) 03/22/2018   Orthostasis 03/05/2018   Dizziness 03/04/2018   COPD (chronic obstructive pulmonary disease) ([place])  12/13/2017   Anxiety and depression 12/13/2017   Trigeminal neuralgia 12/13/2017   GERD (gastroesophageal reflux disease) 12/13/2017   Type 2 diabetes mellitus with stage 3b chronic kidney disease, without long-term current use of insulin ([place]) 12/13/2017   Constipation 12/13/2017   Insomnia 12/13/2017   Hypothyroidism 12/13/2017   UTI (urinary tract infection) 10/08/2015   Low back pain 10/08/2015   Collapsed vertebra, not elsewhere classified, thoracic region, initial encounter for fracture ([place]) 09/20/2015   Basal cell carcinoma of scalp 04/06/2014   Fothergill's neuralgia 08/05/2013   Bladder infection, chronic 02/11/2013   Female genuine stress incontinence 02/11/2013   Incomplete bladder emptying 02/11/2013   Intrinsic sphincter deficiency 02/11/2013   Mixed incontinence 02/11/2013   Excessive urination at night 02/11/2013   Bladder retention 02/11/2013   FOM (frequency of micturition) 02/11/2013   Basal cell carcinoma of face 09/13/2011    Orientation RESPIRATION BLADDER Height & Weight     Self, Time, Situation, Place  O2 ([place] 2L) Continent Weight:   Height:     BEHAVIORAL SYMPTOMS/MOOD NEUROLOGICAL BOWEL NUTRITION STATUS      Continent Diet (see discharge summary)  AMBULATORY STATUS COMMUNICATION OF NEEDS Skin   Extensive Assist Verbally Normal                       Personal Care Assistance Level of Assistance  Bathing, Feeding, Dressing Bathing Assistance: Limited assistance Feeding assistance: Limited assistance Dressing Assistance: Limited assistance     Functional Limitations Info  Sight, Hearing, Speech Sight Info: Impaired Hearing Info: Impaired Speech Info: Adequate    SPECIAL CARE FACTORS FREQUENCY  PT (By licensed  PT), OT (By licensed OT)     PT Frequency: 5 times per week OT Frequency: 5 times per week            Contractures Contractures Info: Not present    Additional Factors Info  Code Status, Allergies Code Status Info:  Full Allergies Info: PCN, sulfa, amitiza, ASA, PCN G           Current Medications (08/02/2021):  This is the current hospital active medication list Current Facility-Administered Medications  Medication Dose Route Frequency Provider Last Rate Last Admin   acetaminophen (TYLENOL) tablet 650 mg  650 mg Oral Q6H PRN Oswald Hillock, RPH       acetaminophen-codeine (TYLENOL #3) 300-30 MG per tablet 1 tablet  1 tablet Oral BID PRN Elwyn Reach, MD       albuterol (PROVENTIL) (2.5 MG/3ML) 0.083% nebulizer solution 2.5 mg  2.5 mg Nebulization Q2H PRN Elwyn Reach, MD       apixaban (ELIQUIS) tablet 2.5 mg  2.5 mg Oral BID Gala Romney L, MD   2.5 mg at 08/02/21 8563   atorvastatin (LIPITOR) tablet 40 mg  40 mg Oral Daily Elwyn Reach, MD   40 mg at 08/02/21 1497   Azelaic Acid 15 % 1 application  1 application Topical BID Elwyn Reach, MD       bisacodyl (DULCOLAX) EC tablet 5 mg  5 mg Oral Daily PRN Elwyn Reach, MD       calcium-vitamin D (OSCAL WITH D) 500-200 MG-UNIT per tablet 1 tablet  1 tablet Oral BID Elwyn Reach, MD   1 tablet at 08/02/21 0263   clotrimazole-betamethasone (LOTRISONE) cream 1 application  1 application Topical BID Elwyn Reach, MD   1 application at 78/58/85 0932   docusate (COLACE) 50 MG/5ML liquid 50 mg  50 mg Oral Daily Gala Romney L, MD   50 mg at 08/02/21 0930   doxycycline (VIBRA-TABS) tablet 100 mg  100 mg Oral Q12H Gala Romney L, MD   100 mg at 08/02/21 0277   feeding supplement (GLUCERNA SHAKE) (GLUCERNA SHAKE) liquid 237 mL  237 mL Oral BID BM [name] A, DO       umeclidinium bromide (INCRUSE ELLIPTA) 62.5 MCG/INH 1 puff  1 puff Inhalation Daily Elwyn Reach, MD   1 puff at 08/02/21 4128   And   fluticasone furoate-vilanterol (BREO ELLIPTA) 100-25 MCG/INH 1 puff  1 puff Inhalation Daily Elwyn Reach, MD   1 puff at 08/02/21 0935   furosemide (LASIX) tablet 40 mg  40 mg Oral Daily Elwyn Reach,  MD   40 mg at 08/02/21 7867   gabapentin (NEURONTIN) capsule 400 mg  400 mg Oral TID Elwyn Reach, MD   400 mg at 08/02/21 6720   guaiFENesin (MUCINEX) 12 hr tablet 1,200 mg  1,200 mg Oral BID PRN Elwyn Reach, MD       hydrocortisone 2.5 % cream 1 application  1 application Topical BID Garba, Mohammad L, MD       insulin aspart (novoLOG) injection 0-9 Units  0-9 Units Subcutaneous TID WC Gala Romney L, MD   3 Units at 08/02/21 1210   ipratropium-albuterol (DUONEB) 0.5-2.5 (3) MG/3ML nebulizer solution 3 mL  3 mL Nebulization Q6H Garba, Mohammad L, MD   3 mL at 08/02/21 0820   levothyroxine (SYNTHROID) tablet 200 mcg  200 mcg Oral Once per day on Mon Tue Wed Thu Fri Sat Garba,  Mohammad L, MD   200 mcg at 08/02/21 0529   lidocaine (LIDODERM) 5 % 1 patch  1 patch Transdermal Q24H Vanessa Hudson, MD   1 patch at 08/01/21 1652   loratadine (CLARITIN) tablet 10 mg  10 mg Oral Daily Gala Romney L, MD   10 mg at 08/02/21 2409   melatonin tablet 10 mg  10 mg Oral QHS Gala Romney L, MD   10 mg at 08/01/21 2349   methylPREDNISolone sodium succinate (SOLU-MEDROL) 40 mg/mL injection 40 mg  40 mg Intravenous Q12H Gala Romney L, MD   40 mg at 08/02/21 7353   Followed by   Derrill Memo ON 08/03/2021] predniSONE (DELTASONE) tablet 40 mg  40 mg Oral Q breakfast Gala Romney L, MD       mirtazapine (REMERON) tablet 15 mg  15 mg Oral QHS Gala Romney L, MD   15 mg at 08/01/21 2349   multivitamin with minerals tablet 1 tablet  1 tablet Oral Daily Elwyn Reach, MD   1 tablet at 08/02/21 2992   mupirocin ointment (BACTROBAN) 2 % 1 application  1 application Topical TID Elwyn Reach, MD   1 application at 42/68/34 0931   pantoprazole (PROTONIX) EC tablet 40 mg  40 mg Oral Daily Gala Romney L, MD   40 mg at 08/02/21 1962   polyethylene glycol (MIRALAX / GLYCOLAX) packet 17 g  17 g Oral Daily Gala Romney L, MD   17 g at 08/02/21 2297   polyvinyl alcohol (LIQUIFILM TEARS) 1.4 %  ophthalmic solution 1 drop  1 drop Both Eyes PRN Elwyn Reach, MD         Discharge Medications: Please see discharge summary for a list of discharge medications.  Relevant Imaging Results:  Relevant Lab Results:   Additional Information SS# [id number]  Shelbie Hutching, RN

## 2021-08-02 NOTE — Progress Notes (Signed)
Initial Nutrition Assessment  DOCUMENTATION CODES:  Not applicable  INTERVENTION:  Continue current diet as ordered, encourage PO intake Obtain new measured weight and height for this admission Continue MVI with minerals daily Glucerna Shake po BID, each supplement provides 220 kcal and 10 grams of protein  NUTRITION DIAGNOSIS:  Increased nutrient needs related to acute illness (COPD exacerbation) as evidenced by estimated needs.  GOAL:  Patient will meet greater than or equal to 90% of their needs  MONITOR:  PO intake, Supplement acceptance, Weight trends  REASON FOR ASSESSMENT:  Consult Assessment of nutrition requirement/status  ASSESSMENT:  87 y.o. female with hx of DM type 2, CAD, COPD on 2L O2, hx CVA, fatty liver, GERD, HTN, HLD, lymphedema, and CKD4, brought to ED from home with SOB and leg pain. Pt reports intermittent leg pain ongoing for the last month.  Pt noncompliant with home O2, workup in ED suggestive of COPD exacerbation.    Unable to speak to pt on phone due to hearing impairment. Will follow-up in-person for nutrition hx and physical exam. No intake or weight recorded this admission but weight appears stable in hx. Will request new measured. Currently on carb modified diet for elevated SBG (likely due to steroid use).  Nutritionally Relevant Medications: Scheduled Meds:  atorvastatin  40 mg Oral Daily   calcium-vitamin D  1 tablet Oral BID   docusate  50 mg Oral Daily   doxycycline  100 mg Oral Q12H   furosemide  40 mg Oral Daily   insulin aspart  0-9 Units Subcutaneous TID WC   methylPREDNISolone (SOLU-MEDROL) injection  40 mg Intravenous Q12H   mirtazapine  15 mg Oral QHS   multivitamin with minerals  1 tablet Oral Daily   pantoprazole  40 mg Oral Daily   polyethylene glycol  17 g Oral Daily   Continuous Infusions:  sodium chloride 50 mL/hr at 08/02/21 0924   PRN Meds: bisacodyl  Labs Reviewed: SBG ranges from 114-242 mg/dL over the last 24  hours HgbA1c 6.4% (4/26)  NUTRITION - FOCUSED PHYSICAL EXAM: Defer to in-person assessment  Diet Order:   Diet Order             Diet Carb Modified Fluid consistency: Thin; Room service appropriate? Yes  Diet effective now                   EDUCATION NEEDS:  No education needs have been identified at this time  Skin:  Skin Assessment: Reviewed RN Assessment  Last BM:  PTA  Height:  Ht Readings from Last 1 Encounters:  07/14/21 5\' 7"  (1.702 m)    Weight:  Wt Readings from Last 1 Encounters:  07/14/21 92.4 kg    Ideal Body Weight:  61.4 kg  BMI:  There is no height or weight on file to calculate BMI.  Estimated Nutritional Needs:  Kcal:  1600-1800 kcal/d Protein:  80-90 g/d Fluid:  1.8-2L/d   Ranell Patrick, RD, LDN Clinical Dietitian Pager on [place]

## 2021-08-02 NOTE — Evaluation (Signed)
Physical Therapy Evaluation Patient Details Name: Jessica Erickson MRN: [id number] DOB: 08/22/1926 Today's Date: 08/02/2021   History of Present Illness  Pt is a 87 y.o. female presenting to hospital 8/29 with SOB and R leg pain (intermittent for a month; pain was in L leg--felt like a muscle pull but now pain in R leg after sitting and sewing).  Pt admitted with acute exacerbation COPD and R LE pain.  PMH includes DM, h/o blood clots on Eliquis with IVC filter, 2 L O2 baseline, acoustic neuroma, T12 compression fx, COPD, CVA, DM, DVT, HOH, htn, neuropathy, photophobia, PE, skin CA, trigeminal neuralgia, urinary incontinence, brain sx 1996 (schwnnoma removal), and breast sx.  Clinical Impression  Utilized pen/paper for communication d/t pt's hearing impairment.  Prior to hospital admission, pt was ambulatory with walker; lives with her son and daughter in law; has caregiver assist 8am-8pm.  Currently pt is SBA semi-supine to sitting edge of bed; CGA to min assist with transfers; and CGA to min assist to ambulate a few feet bed to [facility] with RW.  Antalgic gait noted (pt reporting no pain at rest but R LE appearing painful with activity); pt declined pain medication prior to PT session.  R LE appearing weaker than L LE (although pt reporting it was stronger than yesterday).  Pt would benefit from skilled PT to address noted impairments and functional limitations (see below for any additional details).  Upon hospital discharge, pt would benefit from SNF (pt may be able to discharge home w/c level if has 24/7 physical assist).    Follow Up Recommendations SNF    Equipment Recommendations  Rolling walker with 5" wheels;3in1 (PT);Wheelchair (measurements PT);Wheelchair cushion (measurements PT)    Recommendations for Other Services OT consult     Precautions / Restrictions Precautions Precautions: Fall Restrictions Weight Bearing Restrictions: No      Mobility  Bed Mobility Overal bed mobility:  Needs Assistance Bed Mobility: Supine to Sit     Supine to sit: Supervision;HOB elevated     General bed mobility comments: increased effort and time for pt to perform; use of bed rail    Transfers Overall transfer level: Needs assistance Equipment used: Rolling walker (2 wheeled) Transfers: Sit to/from Stand Sit to Stand: Min guard;Min assist         General transfer comment: increased effort to stand; min assist to control descent sitting onto [facility]  Ambulation/Gait Ambulation/Gait assistance: Min guard;Min assist Gait Distance (Feet): 3 Feet (bed to [facility]) Assistive device: Rolling walker (2 wheeled) Gait Pattern/deviations: Antalgic Gait velocity: decreased   General Gait Details: decreased stance time R LE; decreased B LE step length; mild increased time to take steps  Stairs            Wheelchair Mobility    Modified Rankin (Stroke Patients Only)       Balance Overall balance assessment: Needs assistance Sitting-balance support: No upper extremity supported;Feet supported Sitting balance-Leahy Scale: Good Sitting balance - Comments: steady sitting reaching within BOS   Standing balance support: No upper extremity supported Standing balance-Leahy Scale: Fair Standing balance comment: pt with mild loss of balance when doffing briefs to toilet requiring min assist to regain balance                             Pertinent Vitals/Pain Pain Assessment: Faces Faces Pain Scale: Hurts little more (0/10 at rest) Pain Location: R LE with taking steps Pain Descriptors /  Indicators: Guarding;Grimacing Pain Intervention(s): Limited activity within patient's tolerance;Monitored during session;Repositioned (pt declined pain medication) Vitals (HR and O2 on 3 L via nasal cannula) stable and WFL throughout treatment session.    Home Living Family/patient expects to be discharged to:: Private residence Living Arrangements: Children (pt's son and son's  wife) Available Help at Discharge: Family;Personal care attendant Type of Home: House Home Access: Stairs to enter Entrance Stairs-Rails: None Entrance Stairs-Number of Steps: 2   Home Equipment: Environmental consultant - 2 wheels      Prior Function Level of Independence: Needs assistance   Gait / Transfers Assistance Needed: Able to ambulate short distances with RW; no recent falls     Comments: Has caregiver assist 8am-8pm (assists pt with anything she needs)     Hand Dominance        Extremity/Trunk Assessment   Upper Extremity Assessment Upper Extremity Assessment: Generalized weakness    Lower Extremity Assessment Lower Extremity Assessment: RLE deficits/detail;LLE deficits/detail RLE Deficits / Details: at least 3+/5 hip flexion, 2+/5 R knee extension; at least 3/5 ankle DF/PF LLE Deficits / Details: at least 4-/5 hip flexion, knee flexion/extension, and DF/PF    Cervical / Trunk Assessment Cervical / Trunk Assessment: Other exceptions Cervical / Trunk Exceptions: forward head/shoulders  Communication   Communication: HOH (Pen/paper to communicate)  Cognition Arousal/Alertness: Awake/alert Behavior During Therapy: WFL for tasks assessed/performed Overall Cognitive Status: Within Functional Limits for tasks assessed                                        General Comments  Nursing cleared pt for participation in physical therapy.  Pt agreeable to PT session.    Exercises     Assessment/Plan    PT Assessment Patient needs continued PT services  PT Problem List Decreased strength;Decreased activity tolerance;Decreased balance;Decreased mobility;Decreased knowledge of precautions;Pain       PT Treatment Interventions DME instruction;Gait training;Stair training;Functional mobility training;Therapeutic activities;Therapeutic exercise;Balance training;Patient/family education    PT Goals (Current goals can be found in the Care Plan section)  Acute Rehab PT  Goals Patient Stated Goal: to improve strength and mobility PT Goal Formulation: With patient Time For Goal Achievement: 08/16/21 Potential to Achieve Goals: Good    Frequency Min 2X/week   Barriers to discharge Inaccessible home environment (steps to enter home)      Co-evaluation               AM-PAC PT "6 Clicks" Mobility  Outcome Measure Help needed turning from your back to your side while in a flat bed without using bedrails?: None Help needed moving from lying on your back to sitting on the side of a flat bed without using bedrails?: A Little Help needed moving to and from a bed to a chair (including a wheelchair)?: A Little Help needed standing up from a chair using your arms (e.g., wheelchair or bedside chair)?: A Little Help needed to walk in hospital room?: A Lot Help needed climbing 3-5 steps with a railing? : Total 6 Click Score: 16    End of Session Equipment Utilized During Treatment: Gait belt;Oxygen (3 L via nasal cannula) Activity Tolerance: Patient tolerated treatment well Patient left:  (sitting on BSC with OT present for OT evaluation) Nurse Communication: Mobility status;Precautions PT Visit Diagnosis: Other abnormalities of gait and mobility (R26.89);Muscle weakness (generalized) (M62.81);Unsteadiness on feet (R26.81);Pain Pain - Right/Left: Right Pain - part  of body: Leg    Time: 0122-2411 PT Time Calculation (min) (ACUTE ONLY): 33 min   Charges:   PT Evaluation $PT Eval Low Complexity: 1 Low PT Treatments $Therapeutic Activity: 8-22 mins       Leitha Bleak, PT 08/02/21, 10:51 AM

## 2021-08-02 NOTE — ED Notes (Signed)
Report messaged to debra s rn

## 2021-08-02 NOTE — Evaluation (Signed)
Occupational Therapy Evaluation Patient Details Name: Jessica Erickson MRN: [id number] DOB: Apr 18, 1926 Today's Date: 08/02/2021    History of Present Illness Pt is a 87 y.o. female presenting to hospital 8/29 with SOB and R leg pain (intermittent for a month; pain was in L leg--felt like a muscle pull but now pain in R leg after sitting and sewing).  Pt admitted with acute exacerbation COPD and R LE pain.  PMH includes DM, h/o blood clots on Eliquis with IVC filter, 2 L O2 baseline, acoustic neuroma, T12 compression fx, COPD, CVA, DM, DVT, HOH, htn, neuropathy, photophobia, PE, skin CA, trigeminal neuralgia, urinary incontinence, brain sx 1996 (schwnnoma removal), and breast sx.   Clinical Impression   Pt seen for OT evaluation this date. Upon arrival to room, pt seated on [facility] with PT present. Pt agreeable to OT eval/tx. Of note, pt is deaf and this author communicated via pen/paper. Prior to admission, pt was MOD-I with RW for functional mobility, dressing, and toilet transfers/hygiene. Pt lives with adult children and has a personal care assistance that assists with "whatever is needed" from 8am-8pm. Pt uses supplemental O2 and sponge-bathes at baseline.  Pt currently presents with decreased balance, decreased activity tolerance, and decreased strength. Due to these functional impairments, pt requires MIN A for transfers to/from [facility], MAX A for toilet hygiene, MOD A for LB dressing, and MIN A to walk ~6 steps with RW. Pt reports feeling weaker than usual and would benefit from additional skilled OT services to maximize return to PLOF and minimize risk of future falls, injury, caregiver burden, and readmission. Upon discharge, recommend SNF.        Follow Up Recommendations  SNF    Equipment Recommendations  Other (comment) (defer to next venue of care)       Precautions / Restrictions Precautions Precautions: Fall Restrictions Weight Bearing Restrictions: No      Mobility Bed  Mobility Overal bed mobility: Needs Assistance Bed Mobility: Supine to Sit     Supine to sit: Supervision;HOB elevated     General bed mobility comments: not assessed, pt on BSC at beginning of session and in recliner at end of session    Transfers Overall transfer level: Needs assistance Equipment used: Rolling walker (2 wheeled) Transfers: Sit to/from Stand Sit to Stand: Min guard;Min assist         General transfer comment: MIN A to/from BSC and MIN GUARD to recliner    Balance Overall balance assessment: Needs assistance Sitting-balance support: No upper extremity supported;Feet supported Sitting balance-Leahy Scale: Good Sitting balance - Comments: steady sitting reaching within BOS   Standing balance support: Bilateral upper extremity supported;During functional activity Standing balance-Leahy Scale: Fair Standing balance comment: MIN A to maintain standing balance during sit>stand LB dressing                           ADL either performed or assessed with clinical judgement   ADL Overall ADL's : Needs assistance/impaired                     Lower Body Dressing: Moderate assistance;Sit to/from stand Lower Body Dressing Details (indicate cue type and reason): to don/doff underwear; able to pull up underwear from thighs, however requires assist to thread through feet and to maintain standing balance. Toilet Transfer: Minimal assistance;BSC;Stand-pivot   Toileting- Clothing Manipulation and Hygiene: Maximal assistance;Sit to/from stand Toileting - Clothing Manipulation Details (indicate cue type and reason):  MAX A for posterior peri-care     Functional mobility during ADLs: Minimal assistance;Rolling walker (MIN A to take few steps BSC>recliner)        Pertinent Vitals/Pain Pain Assessment: No/denies pain        Extremity/Trunk Assessment Upper Extremity Assessment Upper Extremity Assessment: Generalized weakness   Lower Extremity  Assessment Lower Extremity Assessment: Generalized weakness;Defer to PT evaluation    Cervical / Trunk Assessment Cervical / Trunk Assessment: Other exceptions Cervical / Trunk Exceptions: forward head/shoulders   Communication Communication Communication: HOH (Pen/paper to communicate)   Cognition Arousal/Alertness: Awake/alert Behavior During Therapy: WFL for tasks assessed/performed Overall Cognitive Status: Within Functional Limits for tasks assessed                                 General Comments: Pleasant and agreeable throughout   General Comments  on 3L of O2 via [place], SpO2 94% at end of session            [place] expects to be discharged to:: Private residence Living Arrangements: Children (pt's son and son's wife) Available Help at Discharge: Family;Personal care attendant;Available PRN/intermittently (PCA assists from 8am-8pm) Type of Home: House Home Access: Stairs to enter [employer] of Steps: 2 Entrance Stairs-Rails: None       Bathroom Shower/Tub: Other (comment) (spongebathes at baseline)         Home Equipment: Walker - 2 wheels          Prior Functioning/Environment Level of Independence: Needs assistance  Gait / Transfers Assistance Needed: Able to ambulate short distances with RW; no recent falls ADL's / Homemaking Assistance Needed: Pt independent with dressing and toilet transfers/hygiene. Pt spongebathes at baseline. Communication / Swallowing Assistance Needed: Pt is deaf. PLOF obtained from this author communicating by writing on paper Comments: Has caregiver assist 8am-8pm (assists pt with anything she needs)        OT Problem List: Decreased strength;Decreased range of motion;Decreased activity tolerance;Impaired balance (sitting and/or standing)      OT Treatment/Interventions: Self-care/ADL training;Therapeutic exercise;Energy conservation;DME and/or AE instruction;Therapeutic  activities;Patient/family education;Balance training    OT Goals(Current goals can be found in the care plan section) Acute Rehab OT Goals Patient Stated Goal: to regain independence OT Goal Formulation: With patient Time For Goal Achievement: 08/16/21 Potential to Achieve Goals: Good ADL Goals Pt Will Perform Lower Body Dressing: with min assist;sit to/from stand Pt Will Transfer to Toilet: with supervision;stand pivot transfer;bedside commode Pt Will Perform Toileting - Clothing Manipulation and hygiene: with min assist;sit to/from stand  OT Frequency: Min 1X/week    AM-PAC OT "6 Clicks" Daily Activity     Outcome Measure Help from another person eating meals?: None Help from another person taking care of personal grooming?: A Little Help from another person toileting, which includes using toliet, bedpan, or urinal?: A Lot Help from another person bathing (including washing, rinsing, drying)?: A Lot Help from another person to put on and taking off regular upper body clothing?: A Little Help from another person to put on and taking off regular lower body clothing?: A Lot 6 Click Score: 16   End of Session Equipment Utilized During Treatment: Gait belt;Rolling walker;Oxygen Nurse Communication: Mobility status  Activity Tolerance: Patient tolerated treatment well Patient left: in chair;with call bell/phone within reach;with chair alarm set  OT Visit Diagnosis: Muscle weakness (generalized) (M62.81);Unsteadiness on feet (R26.81)  Time: 1779-3903 OT Time Calculation (min): 40 min Charges:  OT General Charges $OT Visit: 1 Visit OT Evaluation $OT Eval Moderate Complexity: 1 Mod OT Treatments $Self Care/Home Management : 23-37 mins  Fredirick Maudlin, OTR/L [place]

## 2021-08-02 NOTE — TOC Initial Note (Signed)
Transition of Care [facility]) - Initial/Assessment Note    Patient Details  Name: Jessica Erickson MRN: [id number] Date of Birth: 01/20/1926  Transition of [facility]) CM/SW Contact:    Shelbie Hutching, RN Phone Number: 08/02/2021, 1:19 PM  Clinical Narrative:                 Patient admitted to the hospital with COPD exacerbation.  Patient has oxygen at home chronic 2L.  Patient reports that she does not like wearing her oxygen as it irritates her facial neuralgia.   RNCM met with patient at the bedside.  Patient is deaf but she communicates well when you write down questions for her and she can answer them.  Patient lives at home with her son and daughter in law.  Patient has caregivers from 8am to 8pm every day.    PT has recommended SNF and patient reports that she is interested in rehab.  Patient gives permission for RNCM to speak with her son.   Attempted to call son, no answer and VM is full, will try again later.   Expected Discharge Plan: Skilled Nursing Facility Barriers to Discharge: Continued Medical Work up   Patient Goals and CMS Choice   CMS Medicare.gov Compare Post Acute Care list provided to:: Patient Represenative (must comment) Choice offered to / list presented to : Adult Children  Expected Discharge Plan and Services Expected Discharge Plan: [place]   Discharge Planning Services: CM Consult Post Acute Care Choice: [place] Living arrangements for the past 2 months: Single Family Home                 DME Arranged: N/A DME Agency: NA                  Prior Living Arrangements/Services Living arrangements for the past 2 months: Single Family Home Lives with:: Adult Children Patient language and need for interpreter reviewed:: Yes Do you feel safe going back to the place where you live?: Yes      Need for Family Participation in Patient Care: Yes (Comment) Care giver support system in place?: Yes (comment) (son and daughter  in law) Current home services: DME (oxygen, walker) Criminal Activity/Legal Involvement Pertinent to Current Situation/Hospitalization: No - Comment as needed  Activities of Daily Living Home Assistive Devices/Equipment: Dentures (specify type), Eyeglasses (upper) ADL Screening (condition at time of admission) Patient's cognitive ability adequate to safely complete daily activities?: Yes Is the patient deaf or have difficulty hearing?: Yes Does the patient have difficulty seeing, even when wearing glasses/contacts?: Yes Does the patient have difficulty concentrating, remembering, or making decisions?: No Patient able to express need for assistance with ADLs?: Yes Does the patient have difficulty dressing or bathing?: Yes (needs asst) Independently performs ADLs?: Yes (appropriate for developmental age) Does the patient have difficulty walking or climbing stairs?: Yes Weakness of Legs: Both Weakness of Arms/Hands: Both  Permission Sought/Granted Permission sought to share information with : Case Manager, Family Supports, Other (comment) Permission granted to share information with : Yes, Verbal Permission Granted  Share Information with NAME: Alizey Noren     Permission granted to share info w Relationship: son  Permission granted to share info w Contact Information: [phone]  Emotional Assessment Appearance:: Appears stated age Attitude/Demeanor/Rapport: Engaged Affect (typically observed): Accepting Orientation: : Oriented to Self, Oriented to Place, Oriented to  Time, Oriented to Situation Alcohol / Substance Use: Not Applicable Psych Involvement: No (comment)  Admission diagnosis:  COPD (chronic obstructive pulmonary disease) (HCC) [J44.9] Right leg pain [M79.604] COPD exacerbation ([place]) [J44.1] Patient Active Problem List   Diagnosis Date Noted   Prediabetes 06/29/2021   Pulmonary artery hypertension ([place]) 05/03/2021   Stage 4 chronic kidney disease ([place]) 03/29/2021    Grade I diastolic dysfunction 70/17/7939   Obesity (BMI 30-39.9) 08/16/2020   Chronic kidney disease, stage 3b (HCC) 04/06/2020   Thrombocytopenia ([place]) 04/06/2020   Seborrheic keratoses 04/06/2020   Hip pain 09/30/2019   History of deep vein thrombosis (DVT) of lower extremity 09/30/2019   Arthritis 09/30/2019   Fall 03/11/2019   Epistaxis 12/11/2018   History of DVT (deep vein thrombosis) 11/12/2018   Bilateral leg edema 06/04/2018   Bilateral hearing loss 06/04/2018   Actinic keratosis 06/04/2018   Bronchiectasis ([place]) 06/04/2018   Depression 05/17/2018   Dehydration 05/15/2018   Hypertension 05/03/2018   Lymphedema 05/02/2018   Iron deficiency anemia 05/02/2018   Physical deconditioning 05/02/2018   Drug interaction 05/02/2018   Supratherapeutic INR 05/02/2018   Presence of IVC filter 05/02/2018   Anemia 04/11/2018   Fatigue 04/11/2018   Moderate protein-calorie malnutrition ([place]) 03/22/2018   Orthostasis 03/05/2018   Dizziness 03/04/2018   COPD (chronic obstructive pulmonary disease) ([place]) 12/13/2017   Anxiety and depression 12/13/2017   Trigeminal neuralgia 12/13/2017   GERD (gastroesophageal reflux disease) 12/13/2017   Type 2 diabetes mellitus with stage 3b chronic kidney disease, without long-term current use of insulin ([place]) 12/13/2017   Constipation 12/13/2017   Insomnia 12/13/2017   Hypothyroidism 12/13/2017   UTI (urinary tract infection) 10/08/2015   Low back pain 10/08/2015   Collapsed vertebra, not elsewhere classified, thoracic region, initial encounter for fracture ([place]) 09/20/2015   Basal cell carcinoma of scalp 04/06/2014   Fothergill's neuralgia 08/05/2013   Bladder infection, chronic 02/11/2013   Female genuine stress incontinence 02/11/2013   Incomplete bladder emptying 02/11/2013   Intrinsic sphincter deficiency 02/11/2013   Mixed incontinence 02/11/2013   Excessive urination at night 02/11/2013   Bladder retention 02/11/2013   FOM (frequency  of micturition) 02/11/2013   Basal cell carcinoma of face 09/13/2011   PCP:  McLean-Scocuzza, Nino Glow, MD Pharmacy:   [place], [place] - [address] [place] [address] Phone: [phone] Fax: [phone]     Social Determinants of Health (SDOH) Interventions    Readmission Risk Interventions No flowsheet data found.

## 2021-08-03 ENCOUNTER — Encounter: Payer: Self-pay | Admitting: Internal Medicine

## 2021-08-03 LAB — CBC
HCT: 37.9 % (ref 36.0–46.0)
Hemoglobin: 12.2 g/dL (ref 12.0–15.0)
MCH: 29.8 pg (ref 26.0–34.0)
MCHC: 32.2 g/dL (ref 30.0–36.0)
MCV: 92.4 fL (ref 80.0–100.0)
Platelets: 182 10*3/uL (ref 150–400)
RBC: 4.1 MIL/uL (ref 3.87–5.11)
RDW: 13.7 % (ref 11.5–15.5)
WBC: 7.2 10*3/uL (ref 4.0–10.5)
nRBC: 0 % (ref 0.0–0.2)

## 2021-08-03 LAB — HEMOGLOBIN A1C
Hgb A1c MFr Bld: 6.3 % — ABNORMAL HIGH (ref 4.8–5.6)
Mean Plasma Glucose: 134 mg/dL

## 2021-08-03 LAB — GLUCOSE, CAPILLARY
Glucose-Capillary: 151 mg/dL — ABNORMAL HIGH (ref 70–99)
Glucose-Capillary: 135 mg/dL — ABNORMAL HIGH (ref 70–99)
Glucose-Capillary: 217 mg/dL — ABNORMAL HIGH (ref 70–99)

## 2021-08-03 LAB — BASIC METABOLIC PANEL
Anion gap: 5 (ref 5–15)
BUN: 28 mg/dL — ABNORMAL HIGH (ref 8–23)
CO2: 34 mmol/L — ABNORMAL HIGH (ref 22–32)
Calcium: 8.5 mg/dL — ABNORMAL LOW (ref 8.9–10.3)
Chloride: 101 mmol/L (ref 98–111)
Creatinine, Ser: 1.16 mg/dL — ABNORMAL HIGH (ref 0.44–1.00)
GFR, Estimated: 44 mL/min — ABNORMAL LOW (ref >60)
Glucose, Bld: 181 mg/dL — ABNORMAL HIGH (ref 70–99)
Potassium: 4.5 mmol/L (ref 3.5–5.1)
Sodium: 140 mmol/L (ref 135–145)

## 2021-08-03 MED ORDER — POLYVINYL ALCOHOL 1.4 % OP SOLN
1.0000 [drp] | Freq: Every day | OPHTHALMIC | Status: DC
  Administered 2021-08-03 – 2021-08-04 (×2): 1 [drp] via OPHTHALMIC
  Filled 2021-08-03: qty 15

## 2021-08-03 NOTE — Progress Notes (Signed)
Met family member in hall, upon introductions I inquired if there were any needs. No needs expressed at this time, they were appreciative of the visit.

## 2021-08-03 NOTE — Progress Notes (Signed)
[facility] PROGRESS NOTE    Jessica Erickson  WUJ:[id number] DOB: 1926/10/11 DOA: 08/01/2021 PCP: McLean-Scocuzza, Nino Glow, MD      Brief Narrative:  Jessica Erickson is a 87 y.o. F with COPD, CAD, DM, DVT on Eliquis, history of CVA without residuals, and acute stroke female with hearing loss who presents with shortness of breath and right leg pain.  In the ER, CXR clear, wheezing on exam. X-ray and Doppler ultrasound were unremarkable.  She was admitted for management of severe right leg pain.        Assessment & Plan:  COPD exacerbation Chronic hypoxic respiratory failure On O2 at baseline, fairly sedentary at baseline.  Here, wheezing and SOB. -Continue steroids -Continue doxycycline -Continue bronchodilators -Continue ICS/LABA/LAMA  Continue PPI, loratadine   Right leg pain This is from the knee to the ankle, circumferential, without skin changes, redness, swelling, with negative ultrasound and x-ray.  I am unclear what the cause was, it is resolved completely and spontaneously.   Hypertension BP normal off meds -Hold Lasix  Hypothyroidism - Continue levothyroxine  CKD IIIb Cr stable relative to baseline  History of DVT -Continue Eliquis  Obesity BMI 35   Chronic polyneuropathy - Continue gabapentin  Mood disorder - Continue mirtazapine, melatonin            Disposition: Status is: Inpatient  Remains inpatient appropriate because:Unsafe d/c plan  Dispo: The patient is from: Home              Anticipated d/c is to: SNF              Patient currently is medically stable to d/c.   Difficult to place patient No   Severe right leg pain, dyspnea, wheezing.  She is improving from the standpoint of her COPD flare, can transition to oral agents.  However she is 3 and will need likely several more weeks of physical therapy before she is able to return to her prior level of function  Stable to transition to rehab    Level of  care: Med-Surg       MDM: The below labs and imaging reports were reviewed and summarized above.  Medication management as above.    DVT prophylaxis: apixaban (ELIQUIS) tablet 2.5 mg Start: 08/01/21 2300 apixaban (ELIQUIS) tablet 2.5 mg  Code Status: DNR Family Communication: Daughter at bedsie            Subjective: Patient has no headache, chest pain.  Her breathing is somewhat improved.  Her right leg pain is resolved.  No fever.  No confusion.  Objective: Vitals:   08/03/21 0045 08/03/21 0400 08/03/21 0740 08/03/21 1426  BP: (!) 122/54 (!) 119/52 (!) 124/45   Pulse: 61 64 (!) 54   Resp: 16 16 16    Temp: 97.6 F (36.4 C) 98.2 F (36.8 C) 97.7 F (36.5 C)   TempSrc: Oral Oral    SpO2: 94% 95% 100% 99%  Weight:      Height:        Intake/Output Summary (Last 24 hours) at 08/03/2021 1457 Last data filed at 08/03/2021 1400 Gross per 24 hour  Intake 580 ml  Output --  Net 580 ml   Filed Weights   08/02/21 0235 08/02/21 1400  Weight: 101.6 kg 101.6 kg    Examination: General appearance:  adult female, alert and in no distress.   HEENT: Anicteric, conjunctiva pink, lids and lashes normal. No nasal deformity, discharge, epistaxis.  Lips moist.  Skin: Warm and dry.  no jaundice.  No suspicious rashes or lesions. Cardiac: RRR, nl S1-S2, no murmurs appreciated.  Capillary refill is brisk.  JVP normal.  No LE edema.  Radial pulses 2+ and symmetric. Respiratory: Normal respiratory rate and rhythm.  CTAB without rales or wheezes. Abdomen: Abdomen soft.  no TTP. No ascites, distension, hepatosplenomegaly.   MSK: No deformities or effusions. Neuro: Awake and alert.  EOMI, moves all extremities with generalized weakness. Speech fluent.   Completely deaf. Psych: Sensorium intact and responding to questions, attention normal. Affect blunted.  Judgment and insight appear normal.    Data Reviewed: I have personally reviewed following labs and imaging  studies:  CBC: Recent Labs  Lab 08/01/21 1542 08/02/21 0413 08/03/21 0514  WBC 7.3 3.6* 7.2  HGB 13.4 13.1 12.2  HCT 42.1 40.5 37.9  MCV 94.8 93.1 92.4  PLT 218 180 371   Basic Metabolic Panel: Recent Labs  Lab 08/01/21 1542 08/02/21 0413 08/03/21 0514  NA 142 138 140  K 3.7 3.9 4.5  CL 100 102 101  CO2 34* 29 34*  GLUCOSE 129* 242* 181*  BUN 21 20 28*  CREATININE 1.34* 1.20* 1.16*  CALCIUM 8.7* 8.4* 8.5*   GFR: Estimated Creatinine Clearance: 37.1 mL/min (A) (by C-G formula based on SCr of 1.16 mg/dL (H)). Liver Function Tests: Recent Labs  Lab 08/01/21 1542 08/02/21 0413  AST 25 22  ALT 17 16  ALKPHOS 68 61  BILITOT 0.7 0.6  PROT 6.9 6.5  ALBUMIN 3.5 3.2*   No results for input(s): LIPASE, AMYLASE in the last 168 hours. No results for input(s): AMMONIA in the last 168 hours. Coagulation Profile: No results for input(s): INR, PROTIME in the last 168 hours. Cardiac Enzymes: No results for input(s): CKTOTAL, CKMB, CKMBINDEX, TROPONINI in the last 168 hours. BNP (last 3 results) No results for input(s): PROBNP in the last 8760 hours. HbA1C: Recent Labs    08/02/21 0413  HGBA1C 6.3*   CBG: Recent Labs  Lab 08/02/21 0834 08/02/21 1157 08/02/21 1626 08/03/21 0742 08/03/21 1124  GLUCAP 218* 233* 195* 151* 135*   Lipid Profile: No results for input(s): CHOL, HDL, LDLCALC, TRIG, CHOLHDL, LDLDIRECT in the last 72 hours. Thyroid Function Tests: No results for input(s): TSH, T4TOTAL, FREET4, T3FREE, THYROIDAB in the last 72 hours. Anemia Panel: No results for input(s): VITAMINB12, FOLATE, FERRITIN, TIBC, IRON, RETICCTPCT in the last 72 hours. Urine analysis:    Component Value Date/Time   COLORURINE YELLOW 06/08/2021 1600   APPEARANCEUR CLEAR 06/08/2021 1600   APPEARANCEUR Cloudy (A) 02/27/2019 1055   LABSPEC 1.015 06/08/2021 1600   PHURINE 7.0 06/08/2021 1600   GLUCOSEU NEGATIVE 06/08/2021 1600   GLUCOSEU NEGATIVE 12/24/2017 0931   HGBUR  NEGATIVE 06/08/2021 1600   BILIRUBINUR NEGATIVE 06/08/2021 1600   BILIRUBINUR Negative 02/27/2019 1055   KETONESUR NEGATIVE 06/08/2021 1600   PROTEINUR NEGATIVE 06/08/2021 1600   UROBILINOGEN 0.2 12/26/2018 1523   UROBILINOGEN 0.2 12/24/2017 0931   NITRITE NEGATIVE 06/08/2021 1600   LEUKOCYTESUR NEGATIVE 06/08/2021 1600   Sepsis Labs: @LABRCNTIP (procalcitonin:4,lacticacidven:4)  ) Recent Results (from the past 240 hour(s))  Resp Panel by RT-PCR (Flu A&B, Covid) Nasopharyngeal Swab     Status: None   Collection Time: 08/01/21  4:35 PM   Specimen: Nasopharyngeal Swab; Nasopharyngeal(NP) swabs in vial transport medium  Result Value Ref Range Status   SARS Coronavirus 2 by RT PCR NEGATIVE NEGATIVE Final    Comment: (NOTE) SARS-CoV-2 target nucleic acids are NOT DETECTED.  The SARS-CoV-2 RNA is generally detectable in upper respiratory specimens during the acute phase of infection. The lowest concentration of SARS-CoV-2 viral copies this assay can detect is 138 copies/mL. A negative result does not preclude SARS-Cov-2 infection and should not be used as the sole basis for treatment or other patient management decisions. A negative result may occur with  improper specimen collection/handling, submission of specimen other than nasopharyngeal swab, presence of viral mutation(s) within the areas targeted by this assay, and inadequate number of viral copies(<138 copies/mL). A negative result must be combined with clinical observations, patient history, and epidemiological information. The expected result is Negative.  Fact Sheet for Patients:  EntrepreneurPulse.com.au  Fact Sheet for Healthcare Providers:  IncredibleEmployment.be  This test is no t yet approved or cleared by the [place] FDA and  has been authorized for detection and/or diagnosis of SARS-CoV-2 by FDA under an Emergency Use Authorization (EUA). This EUA will remain  in effect  (meaning this test can be used) for the duration of the COVID-19 declaration under Section 564(b)(1) of the Act, 21 U.S.C.section 360bbb-3(b)(1), unless the authorization is terminated  or revoked sooner.       Influenza A by PCR NEGATIVE NEGATIVE Final   Influenza B by PCR NEGATIVE NEGATIVE Final    Comment: (NOTE) The Xpert Xpress SARS-CoV-2/FLU/RSV plus assay is intended as an aid in the diagnosis of influenza from Nasopharyngeal swab specimens and should not be used as a sole basis for treatment. Nasal washings and aspirates are unacceptable for Xpert Xpress SARS-CoV-2/FLU/RSV testing.  Fact Sheet for Patients: EntrepreneurPulse.com.au  Fact Sheet for Healthcare Providers: IncredibleEmployment.be  This test is not yet approved or cleared by the [place] FDA and has been authorized for detection and/or diagnosis of SARS-CoV-2 by FDA under an Emergency Use Authorization (EUA). This EUA will remain in effect (meaning this test can be used) for the duration of the COVID-19 declaration under Section 564(b)(1) of the Act, 21 U.S.C. section 360bbb-3(b)(1), unless the authorization is terminated or revoked.  Performed at [facility], [address], [place], [place] [address]          Radiology Studies: DG Chest 2 View  Result Date: 08/01/2021 CLINICAL DATA:  Shortness of breath. EXAM: CHEST - 2 VIEW COMPARISON:  June 08, 2021. FINDINGS: The heart size and mediastinal contours are within normal limits. Hypoinflation of the lungs is noted with mild bibasilar subsegmental atelectasis. No pneumothorax is noted. The visualized skeletal structures are unremarkable. IMPRESSION: Hypoinflation of the lungs is noted with mild bibasilar subsegmental atelectasis. Electronically Signed   By: Marijo Conception M.D.   On: 08/01/2021 15:58   DG Tibia/Fibula Right  Result Date: 08/01/2021 CLINICAL DATA:  Pain, no known injury EXAM: RIGHT TIBIA  AND FIBULA - 2 VIEW COMPARISON:  None. FINDINGS: There is no evidence of fracture or other focal bone lesions. Diffuse soft tissue edema about the lower leg and ankle. IMPRESSION: No fracture or dislocation of the right tibia or fibula. Diffuse soft tissue edema about the lower leg and ankle. Electronically Signed   By: Eddie Candle M.D.   On: 08/01/2021 17:03   US Venous Img Lower Unilateral Right  Result Date: 08/01/2021 CLINICAL DATA:  Right lower extremity swelling EXAM: RIGHT LOWER EXTREMITY VENOUS DOPPLER ULTRASOUND TECHNIQUE: Gray-scale sonography with compression, as well as color and duplex ultrasound, were performed to evaluate the deep venous system(s) from the level of the common femoral vein through the popliteal and proximal calf veins. COMPARISON:  None. FINDINGS: VENOUS Normal compressibility of the common femoral, superficial femoral, and popliteal veins, as well as the visualized calf veins. Visualized portions of profunda femoral vein and great saphenous vein unremarkable. No filling defects to suggest DVT on grayscale or color Doppler imaging. Doppler waveforms show normal direction of venous flow, normal respiratory plasticity and response to augmentation. Limited views of the contralateral common femoral vein are unremarkable. OTHER None. Limitations: none IMPRESSION: Negative. Electronically Signed   By: Ulyses Jarred M.D.   On: 08/01/2021 19:05   DG Knee Complete 4 Views Right  Result Date: 08/01/2021 CLINICAL DATA:  Pain, no known injury EXAM: RIGHT KNEE - COMPLETE 4+ VIEW COMPARISON:  None. FINDINGS: No fracture or dislocation of the right knee. Mild tricompartmental joint space narrowing and osteophytosis, worst in the medial compartment. No knee joint effusion. Soft tissues are unremarkable. IMPRESSION: 1.  No fracture or dislocation of the right knee. 2. Mild tricompartmental joint space narrowing and osteophytosis, worst in the medial compartment. Electronically Signed   By:  Eddie Candle M.D.   On: 08/01/2021 17:04   DG Hip Unilat W or Wo Pelvis 2-3 Views Right  Result Date: 08/01/2021 CLINICAL DATA:  Right hip pain, no known injury EXAM: DG HIP (WITH OR WITHOUT PELVIS) 2-3V RIGHT COMPARISON:  None. FINDINGS: Osteopenia. There is no evidence of displaced hip fracture or dislocation. Hip joint spaces are well preserved. Nonobstructive pattern of overlying bowel gas. IMPRESSION: 1.  Osteopenia. No evidence of displaced fracture or dislocation. 2.  Hip joint spaces are well preserved. Electronically Signed   By: Eddie Candle M.D.   On: 08/01/2021 17:03        Scheduled Meds:  apixaban  2.5 mg Oral BID   atorvastatin  40 mg Oral Daily   calcium-vitamin D  1 tablet Oral BID   clotrimazole-betamethasone  1 application Topical BID   docusate  50 mg Oral Daily   doxycycline  100 mg Oral Q12H   feeding supplement (GLUCERNA SHAKE)  237 mL Oral BID BM   umeclidinium bromide  1 puff Inhalation Daily   And   fluticasone furoate-vilanterol  1 puff Inhalation Daily   furosemide  40 mg Oral Daily   gabapentin  400 mg Oral TID   insulin aspart  0-9 Units Subcutaneous TID WC   ipratropium-albuterol  3 mL Nebulization TID   levothyroxine  200 mcg Oral Once per day on Mon Tue Wed Thu Fri Sat   lidocaine  1 patch Transdermal Q24H   loratadine  10 mg Oral Daily   melatonin  10 mg Oral QHS   mirtazapine  15 mg Oral QHS   multivitamin with minerals  1 tablet Oral Daily   pantoprazole  40 mg Oral Daily   polyethylene glycol  17 g Oral Daily   polyvinyl alcohol  1 drop Both Eyes QHS   predniSONE  40 mg Oral Q breakfast   Continuous Infusions:   LOS: 2 days    Time spent: 25 minutes    Edwin Dada, MD [facility] 08/03/2021, 2:57 PM     Please page though AMION or Epic secure chat:  For [employer], Adult nurse

## 2021-08-03 NOTE — TOC Progression Note (Signed)
Transition of Care [facility]) - Progression Note    Patient Details  Name: Jessica Erickson MRN: [id number] Date of Birth: [birth year]  [facility] CM/SW Contact  Candie Chroman, LCSW Phone Number: 08/03/2021, 8:20 AM  Clinical Narrative: No bed offers this morning.    Expected Discharge Plan: [place] Barriers to Discharge: Continued Medical Work up  Expected Discharge Plan and Services Expected Discharge Plan: [place]   Discharge Planning Services: CM Consult Post Acute Care Choice: [place] Living arrangements for the past 2 months: Single Family Home                 DME Arranged: N/A DME Agency: NA                   Social Determinants of Health (SDOH) Interventions    Readmission Risk Interventions No flowsheet data found.

## 2021-08-03 NOTE — Progress Notes (Signed)
Occupational Therapy Treatment Patient Details Name: TIWATOPE EMMITT MRN: [id number] DOB: Apr 29, 1926 Today's Date: 08/03/2021    History of present illness Pt is a 87 y.o. female presenting to hospital 8/29 with SOB and R leg pain (intermittent for a month; pain was in L leg--felt like a muscle pull but now pain in R leg after sitting and sewing).  Pt admitted with acute exacerbation COPD and R LE pain.  PMH includes DM, h/o blood clots on Eliquis with IVC filter, 2 L O2 baseline, acoustic neuroma, T12 compression fx, COPD, CVA, DM, DVT, HOH, htn, neuropathy, photophobia, PE, skin CA, trigeminal neuralgia, urinary incontinence, brain sx 1996 (schwnnoma removal), and breast sx.   OT comments  Pt seen for OT treatment on this date. Upon arrival to room, pt awake and seated upright in bed with 3 family members present. Pt agreeable to OT tx and reporting no pain. Pt continues to present with decrease strength and activity tolerance, however is making good progress towards goals and was able to progress to performing standing grooming tasks with MIN A and walk short household distances (62ft, 2x) with RW and MIN A. Upon return to recliner, pt noted to be incontinent of urine and stool, and required MAX A for sit>stand posterior peri-care, MOD A for sit>stand LB bathing, and MOD A for sit>stand LB dressing. Pt continues to benefit from skilled OT services to maximize return to PLOF and minimize risk of future falls, injury, caregiver burden, and readmission. Will continue to follow POC. Discharge recommendation remains appropriate.     Follow Up Recommendations  SNF    Equipment Recommendations  Other (comment) (defer to next venue of care)       Precautions / Restrictions Precautions Precautions: Fall Restrictions Weight Bearing Restrictions: No       Mobility Bed Mobility Overal bed mobility: Needs Assistance Bed Mobility: Supine to Sit     Supine to sit: Min assist;HOB elevated      General bed mobility comments: Verbal cues for sequencing + MIN A for moving LE    Transfers Overall transfer level: Needs assistance Equipment used: Rolling walker (2 wheeled) Transfers: Sit to/from Stand Sit to Stand: Min guard         General transfer comment: MIN GUARD from recliner and bed    Balance Overall balance assessment: Needs assistance Sitting-balance support: No upper extremity supported;Feet supported Sitting balance-Leahy Scale: Good Sitting balance - Comments: steady sitting reaching outside BOS to attempt to thread underwear throught feet   Standing balance support: Bilateral upper extremity supported;During functional activity Standing balance-Leahy Scale: Fair Standing balance comment: MIN A to maintain standing balance during functional mobility of short household distances (~12ft, 2x)                           ADL either performed or assessed with clinical judgement   ADL Overall ADL's : Needs assistance/impaired     Grooming: Brushing hair;Minimal assistance;Standing;Wash/dry hands;Supervision/safety;Set up;Sitting       Lower Body Bathing: Maximal assistance;Sitting/lateral leans   Upper Body Dressing : Supervision/safety;Set up;Sitting Upper Body Dressing Details (indicate cue type and reason): to doff sweater Lower Body Dressing: Moderate assistance;Sit to/from stand Lower Body Dressing Details (indicate cue type and reason): to don/doff underwear and socks; able to pull up underwear from thighs, however requires assist to thread through feet and to maintain standing balance.     Toileting- Clothing Manipulation and Hygiene: Maximal assistance;Sit to/from stand Toileting -  Clothing Manipulation Details (indicate cue type and reason): MAX A for posterior peri-care     Functional mobility during ADLs: Minimal assistance;Rolling walker (MIN A to walk ~20 ft with one seated rest break)        Cognition Arousal/Alertness:  Awake/alert Behavior During Therapy: WFL for tasks assessed/performed Overall Cognitive Status: Within Functional Limits for tasks assessed                                 General Comments: Pleasant and agreeable throughout              General Comments On RA, SpO2 90% after functional mobility and standing ADLs. Able to increased to 94% with PLB    Pertinent Vitals/ Pain       Pain Assessment: No/denies pain         Frequency  Min 1X/week        Progress Toward Goals  OT Goals(current goals can now be found in the care plan section)  Progress towards OT goals: Progressing toward goals  Acute Rehab OT Goals Patient Stated Goal: to regain independence OT Goal Formulation: With patient Time For Goal Achievement: 08/16/21 Potential to Achieve Goals: Good  Plan Discharge plan remains appropriate;Frequency remains appropriate       AM-PAC OT "6 Clicks" Daily Activity     Outcome Measure   Help from another person eating meals?: None Help from another person taking care of personal grooming?: A Little Help from another person toileting, which includes using toliet, bedpan, or urinal?: A Lot Help from another person bathing (including washing, rinsing, drying)?: A Lot Help from another person to put on and taking off regular upper body clothing?: A Little Help from another person to put on and taking off regular lower body clothing?: A Lot 6 Click Score: 16    End of Session Equipment Utilized During Treatment: Gait belt;Rolling walker  OT Visit Diagnosis: Muscle weakness (generalized) (M62.81);Unsteadiness on feet (R26.81)   Activity Tolerance Patient tolerated treatment well   Patient Left in chair;with call bell/phone within reach;with chair alarm set;with family/visitor present;with nursing/sitter in room   Nurse Communication Mobility status        Time: 2482-5003 OT Time Calculation (min): 43 min  Charges: OT General Charges $OT Visit:  1 Visit OT Treatments $Self Care/Home Management : 38-52 mins  Fredirick Maudlin, OTR/L [place]

## 2021-08-04 LAB — RESP PANEL BY RT-PCR (FLU A&B, COVID) ARPGX2
Influenza A by PCR: NEGATIVE
Influenza B by PCR: NEGATIVE
SARS Coronavirus 2 by RT PCR: NEGATIVE

## 2021-08-04 LAB — GLUCOSE, CAPILLARY
Glucose-Capillary: 133 mg/dL — ABNORMAL HIGH (ref 70–99)
Glucose-Capillary: 111 mg/dL — ABNORMAL HIGH (ref 70–99)
Glucose-Capillary: 234 mg/dL — ABNORMAL HIGH (ref 70–99)
Glucose-Capillary: 244 mg/dL — ABNORMAL HIGH (ref 70–99)

## 2021-08-04 LAB — CBC
HCT: 37.2 % (ref 36.0–46.0)
Hemoglobin: 12 g/dL (ref 12.0–15.0)
MCH: 29.8 pg (ref 26.0–34.0)
MCHC: 32.3 g/dL (ref 30.0–36.0)
MCV: 92.3 fL (ref 80.0–100.0)
Platelets: 196 10*3/uL (ref 150–400)
RBC: 4.03 MIL/uL (ref 3.87–5.11)
RDW: 14.2 % (ref 11.5–15.5)
WBC: 7.8 10*3/uL (ref 4.0–10.5)
nRBC: 0 % (ref 0.0–0.2)

## 2021-08-04 LAB — BASIC METABOLIC PANEL
Anion gap: 6 (ref 5–15)
BUN: 35 mg/dL — ABNORMAL HIGH (ref 8–23)
CO2: 32 mmol/L (ref 22–32)
Calcium: 8.4 mg/dL — ABNORMAL LOW (ref 8.9–10.3)
Chloride: 101 mmol/L (ref 98–111)
Creatinine, Ser: 1.23 mg/dL — ABNORMAL HIGH (ref 0.44–1.00)
GFR, Estimated: 41 mL/min — ABNORMAL LOW (ref >60)
Glucose, Bld: 172 mg/dL — ABNORMAL HIGH (ref 70–99)
Potassium: 4 mmol/L (ref 3.5–5.1)
Sodium: 139 mmol/L (ref 135–145)

## 2021-08-04 MED ORDER — DOXYCYCLINE HYCLATE 100 MG PO TABS
100.0000 mg | ORAL_TABLET | Freq: Two times a day (BID) | ORAL | 0 refills | Status: DC
Start: 2021-08-04 — End: 2021-08-05

## 2021-08-04 MED ORDER — ALBUTEROL SULFATE (2.5 MG/3ML) 0.083% IN NEBU: 2.5000 mg | INHALATION_SOLUTION | Freq: Three times a day (TID) | RESPIRATORY_TRACT | 5 refills | Status: DC

## 2021-08-04 MED ORDER — FUROSEMIDE 40 MG PO TABS: 40.0000 mg | ORAL_TABLET | Freq: Every day | ORAL | Status: DC

## 2021-08-04 MED ORDER — POLYVINYL ALCOHOL 1.4 % OP SOLN: 1.0000 [drp] | Freq: Every day | OPHTHALMIC | 0 refills | Status: AC

## 2021-08-04 MED ORDER — PREDNISONE 10 MG PO TABS: ORAL_TABLET | ORAL | 0 refills | Status: DC

## 2021-08-04 MED ORDER — GLUCERNA SHAKE PO LIQD: 237.0000 mL | Freq: Two times a day (BID) | ORAL | 0 refills | Status: AC

## 2021-08-04 MED ORDER — ACETAMINOPHEN-CODEINE #3 300-30 MG PO TABS
1.0000 | ORAL_TABLET | Freq: Two times a day (BID) | ORAL | 0 refills | Status: DC | PRN
Start: 2021-08-04 — End: 2022-10-27

## 2021-08-04 NOTE — Progress Notes (Signed)
Physical Therapy Treatment Patient Details Name: Jessica Erickson MRN: [id number] DOB: 1926/06/09 Today's Date: 08/04/2021    History of Present Illness Pt is a 87 y.o. female presenting to hospital 8/29 with SOB and R leg pain (intermittent for a month; pain was in L leg--felt like a muscle pull but now pain in R leg after sitting and sewing).  Pt admitted with acute exacerbation COPD and R LE pain.  PMH includes DM, h/o blood clots on Eliquis with IVC filter, 2 L O2 baseline, acoustic neuroma, T12 compression fx, COPD, CVA, DM, DVT, HOH, htn, neuropathy, photophobia, PE, skin CA, trigeminal neuralgia, urinary incontinence, brain sx 1996 (schwnnoma removal), and breast sx.    PT Comments    Pt able to progress to SBA semi-supine to sitting edge of bed; CGA with transfers; and CGA ambulating 100 feet x2 with RW.  Antalgic gait (decreased stance time R LE noted) during ambulation but pt reporting no pain throughout session (therapist communicated with pt via pen and paper during session).  O2 sats 90% or greater on room air with ambulation and O2 sats 92% at rest end of session sitting in recliner.  Improved balance noted today with standing activities.  Pt reports no railing on steps into home (2 steps plus 2 steps to enter)--anticipate pt will have difficulty with stairs navigation d/t decreased strength and WB'ing through R LE with functional mobility.  TOC updated on pt's progress; TOC reporting pt's family still wanting pt to discharge to SNF.  Will continue to focus on strengthening, balance, increasing ambulation distance, and trial stairs as appropriate.  Follow Up Recommendations     SNF   Equipment Recommendations  Rolling walker with 5" wheels;3in1 (PT);Wheelchair (measurements PT);Wheelchair cushion (measurements PT)    Recommendations for Other Services OT consult     Precautions / Restrictions Precautions Precautions: Fall Restrictions Weight Bearing Restrictions: No     Mobility  Bed Mobility Overal bed mobility: Needs Assistance Bed Mobility: Supine to Sit     Supine to sit: Supervision;HOB elevated     General bed mobility comments: mild increased effort to perform on own    Transfers Overall transfer level: Needs assistance Equipment used: Rolling walker (2 wheeled) Transfers: Sit to/from Stand Sit to Stand: Min guard         General transfer comment: x1 trial from bed and x2 trials from recliner; mild increased effort to stand  Ambulation/Gait Ambulation/Gait assistance: Min guard Gait Distance (Feet):  (100 feet x2) Assistive device: Rolling walker (2 wheeled) Gait Pattern/deviations: Antalgic Gait velocity: decreased   General Gait Details: decreased stance time R LE; partial step through gait pattern; cueing to stay closer to RW at times   Stairs             Wheelchair Mobility    Modified Rankin (Stroke Patients Only)       Balance Overall balance assessment: Needs assistance Sitting-balance support: No upper extremity supported;Feet supported Sitting balance-Leahy Scale: Normal Sitting balance - Comments: steady sitting reaching outside BOS   Standing balance support: No upper extremity supported Standing balance-Leahy Scale: Good Standing balance comment: steady standing donning/doffing underwear (pt sat and had initial assist to donn/doff underwear)                            Cognition Arousal/Alertness: Awake/alert Behavior During Therapy: WFL for tasks assessed/performed Overall Cognitive Status: Within Functional Limits for tasks assessed  General Comments: Pleasant and agreeable throughout      Exercises      General Comments  Nursing cleared pt for participation in physical therapy.  Pt agreeable to PT session.      Pertinent Vitals/Pain Pain Assessment: No/denies pain Pain Intervention(s): Limited activity within patient's  tolerance;Monitored during session;Repositioned HR WFL during sessions activities.    Home Living                      Prior Function            PT Goals (current goals can now be found in the care plan section) Acute Rehab PT Goals Patient Stated Goal: to regain independence PT Goal Formulation: With patient Time For Goal Achievement: 08/16/21 Potential to Achieve Goals: Good Progress towards PT goals: Progressing toward goals    Frequency    Min 2X/week      PT Plan      Co-evaluation              AM-PAC PT "6 Clicks" Mobility   Outcome Measure  Help needed turning from your back to your side while in a flat bed without using bedrails?: None Help needed moving from lying on your back to sitting on the side of a flat bed without using bedrails?: A Little Help needed moving to and from a bed to a chair (including a wheelchair)?: A Little Help needed standing up from a chair using your arms (e.g., wheelchair or bedside chair)?: A Little Help needed to walk in hospital room?: A Little Help needed climbing 3-5 steps with a railing? : A Lot 6 Click Score: 18    End of Session Equipment Utilized During Treatment: Gait belt Activity Tolerance: Patient tolerated treatment well Patient left: in chair;with call bell/phone within reach;with chair alarm set Nurse Communication: Mobility status;Precautions;Other (comment) (pt's O2 sats during session) PT Visit Diagnosis: Other abnormalities of gait and mobility (R26.89);Muscle weakness (generalized) (M62.81);Unsteadiness on feet (R26.81);Pain Pain - Right/Left: Right Pain - part of body: Leg     Time: 7169-6789 PT Time Calculation (min) (ACUTE ONLY): 44 min  Charges:  $Gait Training: 8-22 mins $Therapeutic Activity: 23-37 mins                     Leitha Bleak, PT 08/04/21, 9:53 AM

## 2021-08-04 NOTE — Discharge Summary (Addendum)
Physician Discharge Summary  Jessica Erickson IRC:[id number] DOB: 1926/02/06 DOA: 08/01/2021  PCP: McLean-Scocuzza, Nino Glow, MD  Admit date: 08/01/2021 Discharge date: 08/05/2021  Admitted From: Home  Disposition:  SNF [facility]   Recommendations for Outpatient Follow-up:  Follow up with PCP Dr. Terese Door 1 week after SNF discharge Dr. Jacklynn Lewis: Please review BP and resume Lasix if needed   Home Health: N/A Equipment/Devices: TBD at SNF  Discharge Condition: Fair  CODE STATUS: DNR Diet recommendation: Diabetic, cardiac  Brief/Interim Summary: Jessica Erickson is a 86 y.o. F with COPD, CAD, DM, DVT on Eliquis, history of CVA without residuals, and acute stroke female with hearing loss who presents with shortness of breath and right leg pain.   In the ER, CXR clear, wheezing on exam. X-ray and Doppler ultrasound were unremarkable.  She was admitted for management of severe right leg pain.     PRINCIPAL HOSPITAL DIAGNOSIS: COPD exacerbation    Discharge Diagnoses:  COPD exacerbation Chronic hypoxic respiratory failure Admitted and started on steroids, bronchodilators, antibiotics.    Symptoms improved.  Discharged to finish 5 days doxycycline with 1 more dose tonight, and 7 day prednisone taper starting tomorrow as outlined below.    Given nebulized albuterol three times daily for one week, then reduce to as needed    Right leg pain This was from the knee to the ankle, circumferential, without skin changes, redness, swelling, with negative ultrasound and x-ray.  I am unclear what the cause was, it is resolved completely and spontaneously.     Hypertension BP normal off meds.  Hold Lasix and follow up with PCP   Hypothyroidism  CKD IIIb Cr stable relative to baseline  History of DVT Continue Eliquis   Obesity BMI 35   Chronic polyneuropathy                 Discharge Instructions  Discharge Instructions     Increase activity slowly   Complete  by: As directed       Allergies as of 08/05/2021       Reactions   Penicillins Shortness Of Breath, Rash, Other (See Comments)   Has patient had a PCN reaction causing immediate rash, facial/tongue/throat swelling, SOB or lightheadedness with hypotension: Yes Has patient had a PCN reaction causing severe rash involving mucus membranes or skin necrosis: No Has patient had a PCN reaction that required hospitalization No Has patient had a PCN reaction occurring within the last 10 years: No If all of the above answers are "NO", then may proceed with Cephalosporin use.   Sulfa Antibiotics Shortness Of Breath, Rash, Other (See Comments)   Amitiza [lubiprostone]    N/v/d   Aspirin Other (See Comments)   Reaction:  Unknown  Other reaction(s): Bleeding (intolerance) Per patient " causes nose to bleed" Can take 81 mg daily without any complications Other reaction(s): "bloody nose"   Penicillin G Other (See Comments)   Has patient had a PCN reaction causing immediate rash, facial/tongue/throat swelling, SOB or lightheadedness with hypotension: No Has patient had a PCN reaction causing severe rash involving mucus membranes or skin necrosis: Unknown Has patient had a PCN reaction that required hospitalization: Unknown Has patient had a PCN reaction occurring within the last 10 years: Unknown If all of the above answers are "NO", then may proceed with Cephalosporin use.        Medication List     STOP taking these medications    Azelaic Acid 15 % gel   furosemide 40  MG tablet Commonly known as: Lasix   hydrocortisone 2.5 % cream   mupirocin ointment 2 % Commonly known as: BACTROBAN       TAKE these medications    acetaminophen 325 MG tablet Commonly known as: TYLENOL Take 2 tablets (650 mg total) by mouth every 6 (six) hours as needed.   acetaminophen-codeine 300-30 MG tablet Commonly known as: TYLENOL #3 Take 1 tablet by mouth 2 (two) times daily as needed for moderate  pain.   albuterol (2.5 MG/3ML) 0.083% nebulizer solution Commonly known as: PROVENTIL Take 3 mLs (2.5 mg total) by nebulization in the morning, at noon, and at bedtime. What changed:  when to take this reasons to take this   apixaban 2.5 MG Tabs tablet Commonly known as: Eliquis Take 1 tablet (2.5 mg total) by mouth 2 (two) times daily.   atorvastatin 40 MG tablet Commonly known as: LIPITOR Take 1 tablet (40 mg total) by mouth daily at 6 PM. Generic ok What changed: when to take this   BISACODYL PO Take by mouth.   Calcium Carbonate-Vitamin D 600-400 MG-UNIT tablet Commonly known as: Calcium 600+D Take 1 tablet by mouth 2 (two) times daily. Lunch and dinner   cetirizine 10 MG tablet Commonly known as: ZYRTEC Take 1 tablet (10 mg total) by mouth daily.   clotrimazole-betamethasone lotion Commonly known as: LOTRISONE Apply 1 application topically 2 (two) times daily.   DOK PO Take 1 tablet by mouth daily.   doxycycline 100 MG tablet Commonly known as: VIBRA-TABS Take 1 tablet (100 mg total) by mouth every 12 (twelve) hours for 1 dose.   feeding supplement (GLUCERNA SHAKE) Liqd Take 237 mLs by mouth 2 (two) times daily between meals.   gabapentin 400 MG capsule Commonly known as: NEURONTIN Take 1 capsule (400 mg total) by mouth 3 (three) times daily.   guaiFENesin 600 MG 12 hr tablet Commonly known as: MUCINEX Take 2 tablets (1,200 mg total) by mouth 2 (two) times daily as needed for cough or to loosen phlegm.   levothyroxine 200 MCG tablet Commonly known as: SYNTHROID Take 1 tablet (200 mcg total) by mouth daily before breakfast. Except on Sunday. Do not take with other medications or vitamins   Melatonin 10 MG Tabs Take 10 mg by mouth at bedtime.   mirtazapine 15 MG tablet Commonly known as: Remeron Take 1 tablet (15 mg total) by mouth at bedtime.   multivitamin with minerals Tabs tablet Take 1 tablet by mouth daily.   pantoprazole 40 MG  tablet Commonly known as: PROTONIX Take 1 tablet (40 mg total) by mouth daily. 30 minutes before lunch or dinner   polyethylene glycol powder 17 GM/SCOOP powder Commonly known as: GLYCOLAX/MIRALAX Take 17 g by mouth daily.   polyvinyl alcohol 1.4 % ophthalmic solution Commonly known as: LIQUIFILM TEARS Place 1 drop into both eyes at bedtime. What changed:  when to take this reasons to take this   predniSONE 10 MG tablet Commonly known as: DELTASONE Take 40 mg for 1 day then 30 mg for 2 days then 20 mg for 2 days then 10 mg for 2 days then stop   Trelegy Ellipta 100-62.5-25 MCG/INH Aepb Generic drug: Fluticasone-Umeclidin-Vilant Inhale 1 puff into the lungs daily. Rinse mouth d/c symbicort and spiriva        Follow-up Information     McLean-Scocuzza, Nino Glow, MD. Schedule an appointment as soon as possible for a visit in 1 week(s).   Specialty: Internal Medicine Why: one week after  discharge from rehab Contact information: [place] [id number] [phone]                Allergies  Allergen Reactions   Penicillins Shortness Of Breath, Rash and Other (See Comments)    Has patient had a PCN reaction causing immediate rash, facial/tongue/throat swelling, SOB or lightheadedness with hypotension: Yes Has patient had a PCN reaction causing severe rash involving mucus membranes or skin necrosis: No Has patient had a PCN reaction that required hospitalization No Has patient had a PCN reaction occurring within the last 10 years: No If all of the above answers are "NO", then may proceed with Cephalosporin use.   Sulfa Antibiotics Shortness Of Breath, Rash and Other (See Comments)   Amitiza [Lubiprostone]     N/v/d   Aspirin Other (See Comments)    Reaction:  Unknown  Other reaction(s): Bleeding (intolerance) Per patient " causes nose to bleed" Can take 81 mg daily without any complications Other reaction(s): "bloody nose"    Penicillin G Other (See  Comments)    Has patient had a PCN reaction causing immediate rash, facial/tongue/throat swelling, SOB or lightheadedness with hypotension: No Has patient had a PCN reaction causing severe rash involving mucus membranes or skin necrosis: Unknown Has patient had a PCN reaction that required hospitalization: Unknown Has patient had a PCN reaction occurring within the last 10 years: Unknown If all of the above answers are "NO", then may proceed with Cephalosporin use.      Procedures/Studies: DG Chest 2 View  Result Date: 08/01/2021 CLINICAL DATA:  Shortness of breath. EXAM: CHEST - 2 VIEW COMPARISON:  June 08, 2021. FINDINGS: The heart size and mediastinal contours are within normal limits. Hypoinflation of the lungs is noted with mild bibasilar subsegmental atelectasis. No pneumothorax is noted. The visualized skeletal structures are unremarkable. IMPRESSION: Hypoinflation of the lungs is noted with mild bibasilar subsegmental atelectasis. Electronically Signed   By: Marijo Conception M.D.   On: 08/01/2021 15:58   DG Tibia/Fibula Right  Result Date: 08/01/2021 CLINICAL DATA:  Pain, no known injury EXAM: RIGHT TIBIA AND FIBULA - 2 VIEW COMPARISON:  None. FINDINGS: There is no evidence of fracture or other focal bone lesions. Diffuse soft tissue edema about the lower leg and ankle. IMPRESSION: No fracture or dislocation of the right tibia or fibula. Diffuse soft tissue edema about the lower leg and ankle. Electronically Signed   By: Eddie Candle M.D.   On: 08/01/2021 17:03   US Venous Img Lower Unilateral Right  Result Date: 08/01/2021 CLINICAL DATA:  Right lower extremity swelling EXAM: RIGHT LOWER EXTREMITY VENOUS DOPPLER ULTRASOUND TECHNIQUE: Gray-scale sonography with compression, as well as color and duplex ultrasound, were performed to evaluate the deep venous system(s) from the level of the common femoral vein through the popliteal and proximal calf veins. COMPARISON:  None. FINDINGS: VENOUS  Normal compressibility of the common femoral, superficial femoral, and popliteal veins, as well as the visualized calf veins. Visualized portions of profunda femoral vein and great saphenous vein unremarkable. No filling defects to suggest DVT on grayscale or color Doppler imaging. Doppler waveforms show normal direction of venous flow, normal respiratory plasticity and response to augmentation. Limited views of the contralateral common femoral vein are unremarkable. OTHER None. Limitations: none IMPRESSION: Negative. Electronically Signed   By: Ulyses Jarred M.D.   On: 08/01/2021 19:05   DG Knee Complete 4 Views Right  Result Date: 08/01/2021 CLINICAL DATA:  Pain, no known injury EXAM:  RIGHT KNEE - COMPLETE 4+ VIEW COMPARISON:  None. FINDINGS: No fracture or dislocation of the right knee. Mild tricompartmental joint space narrowing and osteophytosis, worst in the medial compartment. No knee joint effusion. Soft tissues are unremarkable. IMPRESSION: 1.  No fracture or dislocation of the right knee. 2. Mild tricompartmental joint space narrowing and osteophytosis, worst in the medial compartment. Electronically Signed   By: Eddie Candle M.D.   On: 08/01/2021 17:04   DG Hip Unilat W or Wo Pelvis 2-3 Views Right  Result Date: 08/01/2021 CLINICAL DATA:  Right hip pain, no known injury EXAM: DG HIP (WITH OR WITHOUT PELVIS) 2-3V RIGHT COMPARISON:  None. FINDINGS: Osteopenia. There is no evidence of displaced hip fracture or dislocation. Hip joint spaces are well preserved. Nonobstructive pattern of overlying bowel gas. IMPRESSION: 1.  Osteopenia. No evidence of displaced fracture or dislocation. 2.  Hip joint spaces are well preserved. Electronically Signed   By: Eddie Candle M.D.   On: 08/01/2021 17:03      Subjective: No fever, no confusion.  No leg pain.  No respiratory distress, still some mild cough and wheezing.  Discharge Exam: Vitals:   08/04/21 0515 08/04/21 0714  BP: (!) 111/48 (!) 123/52   Pulse: (!) 59 (!) 55  Resp: 16 17  Temp: 97.8 F (36.6 C) (!) 97.3 F (36.3 C)  SpO2: 91% 93%   Vitals:   08/04/21 0031 08/04/21 0100 08/04/21 0515 08/04/21 0714  BP: (!) 101/51  (!) 111/48 (!) 123/52  Pulse: 68  (!) 59 (!) 55  Resp: 18  16 17   Temp: 98.4 F (36.9 C)  97.8 F (36.6 C) (!) 97.3 F (36.3 C)  TempSrc:    Oral  SpO2: 91% 93% 91% 93%  Weight:      Height:        General: Pt is alert, awake, not in acute distress Cardiovascular: RRR, nl S1-S2, no murmurs appreciated.   No LE edema.   Respiratory: Normal respiratory rate and rhythm.  Some mild wheezing. Abdominal: Abdomen soft and non-tender.  No distension or HSM.   Neuro/Psych: Strength symmetric in upper and lower extremities.  Judgment and insight appear normal.   The results of significant diagnostics from this hospitalization (including imaging, microbiology, ancillary and laboratory) are listed below for reference.     Microbiology: Recent Results (from the past 240 hour(s))  Resp Panel by RT-PCR (Flu A&B, Covid) Nasopharyngeal Swab     Status: None   Collection Time: 08/01/21  4:35 PM   Specimen: Nasopharyngeal Swab; Nasopharyngeal(NP) swabs in vial transport medium  Result Value Ref Range Status   SARS Coronavirus 2 by RT PCR NEGATIVE NEGATIVE Final    Comment: (NOTE) SARS-CoV-2 target nucleic acids are NOT DETECTED.  The SARS-CoV-2 RNA is generally detectable in upper respiratory specimens during the acute phase of infection. The lowest concentration of SARS-CoV-2 viral copies this assay can detect is 138 copies/mL. A negative result does not preclude SARS-Cov-2 infection and should not be used as the sole basis for treatment or other patient management decisions. A negative result may occur with  improper specimen collection/handling, submission of specimen other than nasopharyngeal swab, presence of viral mutation(s) within the areas targeted by this assay, and inadequate number of  viral copies(<138 copies/mL). A negative result must be combined with clinical observations, patient history, and epidemiological information. The expected result is Negative.  Fact Sheet for Patients:  EntrepreneurPulse.com.au  Fact Sheet for Healthcare Providers:  IncredibleEmployment.be  This test is  no t yet approved or cleared by the [place] and  has been authorized for detection and/or diagnosis of SARS-CoV-2 by FDA under an Emergency Use Authorization (EUA). This EUA will remain  in effect (meaning this test can be used) for the duration of the COVID-19 declaration under Section 564(b)(1) of the Act, 21 U.S.C.section 360bbb-3(b)(1), unless the authorization is terminated  or revoked sooner.       Influenza A by PCR NEGATIVE NEGATIVE Final   Influenza B by PCR NEGATIVE NEGATIVE Final    Comment: (NOTE) The Xpert Xpress SARS-CoV-2/FLU/RSV plus assay is intended as an aid in the diagnosis of influenza from Nasopharyngeal swab specimens and should not be used as a sole basis for treatment. Nasal washings and aspirates are unacceptable for Xpert Xpress SARS-CoV-2/FLU/RSV testing.  Fact Sheet for Patients: EntrepreneurPulse.com.au  Fact Sheet for Healthcare Providers: IncredibleEmployment.be  This test is not yet approved or cleared by the [place] FDA and has been authorized for detection and/or diagnosis of SARS-CoV-2 by FDA under an Emergency Use Authorization (EUA). This EUA will remain in effect (meaning this test can be used) for the duration of the COVID-19 declaration under Section 564(b)(1) of the Act, 21 U.S.C. section 360bbb-3(b)(1), unless the authorization is terminated or revoked.  Performed at [facility], [address] [place], [place] [address]      Labs: BNP (last 3 results) Recent Labs    04/26/21 1848 08/01/21 1542  BNP 69.6 10.2   Basic  Metabolic Panel: Recent Labs  Lab 08/01/21 1542 08/02/21 0413 08/03/21 0514 08/04/21 0509  NA 142 138 140 139  K 3.7 3.9 4.5 4.0  CL 100 102 101 101  CO2 34* 29 34* 32  GLUCOSE 129* 242* 181* 172*  BUN 21 20 28* 35*  CREATININE 1.34* 1.20* 1.16* 1.23*  CALCIUM 8.7* 8.4* 8.5* 8.4*   Liver Function Tests: Recent Labs  Lab 08/01/21 1542 08/02/21 0413  AST 25 22  ALT 17 16  ALKPHOS 68 61  BILITOT 0.7 0.6  PROT 6.9 6.5  ALBUMIN 3.5 3.2*   No results for input(s): LIPASE, AMYLASE in the last 168 hours. No results for input(s): AMMONIA in the last 168 hours. CBC: Recent Labs  Lab 08/01/21 1542 08/02/21 0413 08/03/21 0514 08/04/21 0509  WBC 7.3 3.6* 7.2 7.8  HGB 13.4 13.1 12.2 12.0  HCT 42.1 40.5 37.9 37.2  MCV 94.8 93.1 92.4 92.3  PLT 218 180 182 196   Cardiac Enzymes: No results for input(s): CKTOTAL, CKMB, CKMBINDEX, TROPONINI in the last 168 hours. BNP: Invalid input(s): POCBNP CBG: Recent Labs  Lab 08/02/21 1626 08/03/21 0742 08/03/21 1124 08/03/21 1545 08/04/21 0716  GLUCAP 195* 151* 135* 217* 133*   D-Dimer No results for input(s): DDIMER in the last 72 hours. Hgb A1c Recent Labs    08/02/21 0413  HGBA1C 6.3*   Lipid Profile No results for input(s): CHOL, HDL, LDLCALC, TRIG, CHOLHDL, LDLDIRECT in the last 72 hours. Thyroid function studies No results for input(s): TSH, T4TOTAL, T3FREE, THYROIDAB in the last 72 hours.  Invalid input(s): FREET3 Anemia work up No results for input(s): VITAMINB12, FOLATE, FERRITIN, TIBC, IRON, RETICCTPCT in the last 72 hours. Urinalysis    Component Value Date/Time   COLORURINE YELLOW 06/08/2021 1600   APPEARANCEUR CLEAR 06/08/2021 1600   APPEARANCEUR Cloudy (A) 02/27/2019 1055   LABSPEC 1.015 06/08/2021 1600   PHURINE 7.0 06/08/2021 [place] 06/08/2021 [place] 12/24/2017 0931  HGBUR NEGATIVE 06/08/2021 1600   BILIRUBINUR NEGATIVE 06/08/2021 1600   BILIRUBINUR Negative  02/27/2019 1055   KETONESUR NEGATIVE 06/08/2021 1600   PROTEINUR NEGATIVE 06/08/2021 1600   UROBILINOGEN 0.2 12/26/2018 1523   UROBILINOGEN 0.2 12/24/2017 0931   NITRITE NEGATIVE 06/08/2021 1600   LEUKOCYTESUR NEGATIVE 06/08/2021 1600   Sepsis Labs Invalid input(s): PROCALCITONIN,  WBC,  LACTICIDVEN Microbiology Recent Results (from the past 240 hour(s))  Resp Panel by RT-PCR (Flu A&B, Covid) Nasopharyngeal Swab     Status: None   Collection Time: 08/01/21  4:35 PM   Specimen: Nasopharyngeal Swab; Nasopharyngeal(NP) swabs in vial transport medium  Result Value Ref Range Status   SARS Coronavirus 2 by RT PCR NEGATIVE NEGATIVE Final    Comment: (NOTE) SARS-CoV-2 target nucleic acids are NOT DETECTED.  The SARS-CoV-2 RNA is generally detectable in upper respiratory specimens during the acute phase of infection. The lowest concentration of SARS-CoV-2 viral copies this assay can detect is 138 copies/mL. A negative result does not preclude SARS-Cov-2 infection and should not be used as the sole basis for treatment or other patient management decisions. A negative result may occur with  improper specimen collection/handling, submission of specimen other than nasopharyngeal swab, presence of viral mutation(s) within the areas targeted by this assay, and inadequate number of viral copies(<138 copies/mL). A negative result must be combined with clinical observations, patient history, and epidemiological information. The expected result is Negative.  Fact Sheet for Patients:  EntrepreneurPulse.com.au  Fact Sheet for Healthcare Providers:  IncredibleEmployment.be  This test is no t yet approved or cleared by the [place] FDA and  has been authorized for detection and/or diagnosis of SARS-CoV-2 by FDA under an Emergency Use Authorization (EUA). This EUA will remain  in effect (meaning this test can be used) for the duration of the COVID-19  declaration under Section 564(b)(1) of the Act, 21 U.S.C.section 360bbb-3(b)(1), unless the authorization is terminated  or revoked sooner.       Influenza A by PCR NEGATIVE NEGATIVE Final   Influenza B by PCR NEGATIVE NEGATIVE Final    Comment: (NOTE) The Xpert Xpress SARS-CoV-2/FLU/RSV plus assay is intended as an aid in the diagnosis of influenza from Nasopharyngeal swab specimens and should not be used as a sole basis for treatment. Nasal washings and aspirates are unacceptable for Xpert Xpress SARS-CoV-2/FLU/RSV testing.  Fact Sheet for Patients: EntrepreneurPulse.com.au  Fact Sheet for Healthcare Providers: IncredibleEmployment.be  This test is not yet approved or cleared by the [place] FDA and has been authorized for detection and/or diagnosis of SARS-CoV-2 by FDA under an Emergency Use Authorization (EUA). This EUA will remain in effect (meaning this test can be used) for the duration of the COVID-19 declaration under Section 564(b)(1) of the Act, 21 U.S.C. section 360bbb-3(b)(1), unless the authorization is terminated or revoked.  Performed at [facility], [address] [place], [place] [address]      Time coordinating discharge: 49minutes The [place] controlled substances registry was reviewed for this patient prior to filling the <5 days supply controlled substances script.    30 Day Unplanned Readmission Risk Score    Flowsheet Row ED to Hosp-Admission (Current) from 08/01/2021 in [place] (1C)  30 Day Unplanned Readmission Risk Score (%) 22.03 Filed at 08/04/2021 0801       This score is the patient's risk of an unplanned readmission within 30 days of being discharged (0 -100%). The score is based on dignosis, age, lab data, medications, orders,  and past utilization.   Low:  0-14.9   Medium: 15-21.9   High: 22-29.9   Extreme: 30 and above             SIGNED:   Edwin Dada, MD  [facility] 08/04/2021, 11:40 AM

## 2021-08-04 NOTE — Care Management Important Message (Signed)
Important Message  Patient Details  Name: Jessica Erickson MRN: [id number] Date of Birth: September 24, 1926   Medicare Important Message Given:  Yes  Reviewed the Important Message from Medicare with her son, Tanny Harnack by phone [phone] and he and his sister is in agreement with the discharge plan.  I asked if he would like a copy and he replied no.  I wished his mom well and thanked him for his time.  Juliann Pulse A [place] Anagnos 08/04/2021, 3:12 PM

## 2021-08-04 NOTE — TOC Progression Note (Signed)
Transition of Care [facility] - Progression Note    Patient Details  Name: Jessica Erickson MRN: [id number] Date of Birth: June 17, 1926  [facility] CM/SW Contact  Shelbie Hutching, RN Phone Number: 08/04/2021, 11:52 AM  Clinical Narrative:    [facility] can offer a bed and family accepts.  Insurance authorization is being stated.  [facility] can accept patient tomorrow.    Expected Discharge Plan: [place] Barriers to Discharge: Continued Medical Work up  Expected Discharge Plan and Services Expected Discharge Plan: [place]   Discharge Planning Services: CM Consult Post Acute Care Choice: [place] Living arrangements for the past 2 months: Single Family Home Expected Discharge Date: 08/04/21               DME Arranged: N/A DME Agency: NA                   Social Determinants of Health (SDOH) Interventions    Readmission Risk Interventions No flowsheet data found.

## 2021-08-05 DIAGNOSIS — Z6832 Body mass index (BMI) 32.0-32.9, adult: Secondary | ICD-10-CM | POA: Diagnosis not present

## 2021-08-05 DIAGNOSIS — E039 Hypothyroidism, unspecified: Secondary | ICD-10-CM | POA: Diagnosis present

## 2021-08-05 DIAGNOSIS — R531 Weakness: Secondary | ICD-10-CM | POA: Diagnosis not present

## 2021-08-05 DIAGNOSIS — J811 Chronic pulmonary edema: Secondary | ICD-10-CM | POA: Diagnosis not present

## 2021-08-05 DIAGNOSIS — G4733 Obstructive sleep apnea (adult) (pediatric): Secondary | ICD-10-CM | POA: Diagnosis present

## 2021-08-05 DIAGNOSIS — N1832 Chronic kidney disease, stage 3b: Secondary | ICD-10-CM | POA: Diagnosis not present

## 2021-08-05 DIAGNOSIS — J9621 Acute and chronic respiratory failure with hypoxia: Secondary | ICD-10-CM | POA: Diagnosis present

## 2021-08-05 DIAGNOSIS — Z7189 Other specified counseling: Secondary | ICD-10-CM | POA: Diagnosis not present

## 2021-08-05 DIAGNOSIS — D72829 Elevated white blood cell count, unspecified: Secondary | ICD-10-CM | POA: Diagnosis present

## 2021-08-05 DIAGNOSIS — J449 Chronic obstructive pulmonary disease, unspecified: Secondary | ICD-10-CM | POA: Diagnosis not present

## 2021-08-05 DIAGNOSIS — E669 Obesity, unspecified: Secondary | ICD-10-CM | POA: Diagnosis present

## 2021-08-05 DIAGNOSIS — J690 Pneumonitis due to inhalation of food and vomit: Secondary | ICD-10-CM | POA: Diagnosis present

## 2021-08-05 DIAGNOSIS — G500 Trigeminal neuralgia: Secondary | ICD-10-CM | POA: Diagnosis present

## 2021-08-05 DIAGNOSIS — R0902 Hypoxemia: Secondary | ICD-10-CM | POA: Diagnosis present

## 2021-08-05 DIAGNOSIS — M1990 Unspecified osteoarthritis, unspecified site: Secondary | ICD-10-CM | POA: Diagnosis present

## 2021-08-05 DIAGNOSIS — Z66 Do not resuscitate: Secondary | ICD-10-CM | POA: Diagnosis present

## 2021-08-05 DIAGNOSIS — Z7401 Bed confinement status: Secondary | ICD-10-CM | POA: Diagnosis not present

## 2021-08-05 DIAGNOSIS — M79604 Pain in right leg: Secondary | ICD-10-CM | POA: Diagnosis not present

## 2021-08-05 DIAGNOSIS — R609 Edema, unspecified: Secondary | ICD-10-CM | POA: Diagnosis not present

## 2021-08-05 DIAGNOSIS — J45909 Unspecified asthma, uncomplicated: Secondary | ICD-10-CM | POA: Diagnosis not present

## 2021-08-05 DIAGNOSIS — R0602 Shortness of breath: Secondary | ICD-10-CM | POA: Diagnosis not present

## 2021-08-05 DIAGNOSIS — G4700 Insomnia, unspecified: Secondary | ICD-10-CM | POA: Diagnosis present

## 2021-08-05 DIAGNOSIS — J9611 Chronic respiratory failure with hypoxia: Secondary | ICD-10-CM | POA: Diagnosis not present

## 2021-08-05 DIAGNOSIS — E119 Type 2 diabetes mellitus without complications: Secondary | ICD-10-CM | POA: Diagnosis not present

## 2021-08-05 DIAGNOSIS — I959 Hypotension, unspecified: Secondary | ICD-10-CM | POA: Diagnosis not present

## 2021-08-05 DIAGNOSIS — Z20822 Contact with and (suspected) exposure to covid-19: Secondary | ICD-10-CM | POA: Diagnosis present

## 2021-08-05 DIAGNOSIS — K219 Gastro-esophageal reflux disease without esophagitis: Secondary | ICD-10-CM | POA: Diagnosis present

## 2021-08-05 DIAGNOSIS — R069 Unspecified abnormalities of breathing: Secondary | ICD-10-CM | POA: Diagnosis not present

## 2021-08-05 DIAGNOSIS — J441 Chronic obstructive pulmonary disease with (acute) exacerbation: Secondary | ICD-10-CM | POA: Diagnosis present

## 2021-08-05 DIAGNOSIS — I10 Essential (primary) hypertension: Secondary | ICD-10-CM | POA: Diagnosis not present

## 2021-08-05 DIAGNOSIS — I130 Hypertensive heart and chronic kidney disease with heart failure and stage 1 through stage 4 chronic kidney disease, or unspecified chronic kidney disease: Secondary | ICD-10-CM | POA: Diagnosis present

## 2021-08-05 DIAGNOSIS — I2510 Atherosclerotic heart disease of native coronary artery without angina pectoris: Secondary | ICD-10-CM | POA: Diagnosis present

## 2021-08-05 DIAGNOSIS — Z87891 Personal history of nicotine dependence: Secondary | ICD-10-CM | POA: Diagnosis not present

## 2021-08-05 DIAGNOSIS — I5033 Acute on chronic diastolic (congestive) heart failure: Secondary | ICD-10-CM | POA: Diagnosis present

## 2021-08-05 DIAGNOSIS — E1140 Type 2 diabetes mellitus with diabetic neuropathy, unspecified: Secondary | ICD-10-CM | POA: Diagnosis not present

## 2021-08-05 DIAGNOSIS — Z515 Encounter for palliative care: Secondary | ICD-10-CM | POA: Diagnosis not present

## 2021-08-05 DIAGNOSIS — F32A Depression, unspecified: Secondary | ICD-10-CM | POA: Diagnosis present

## 2021-08-05 DIAGNOSIS — F419 Anxiety disorder, unspecified: Secondary | ICD-10-CM | POA: Diagnosis present

## 2021-08-05 DIAGNOSIS — E1122 Type 2 diabetes mellitus with diabetic chronic kidney disease: Secondary | ICD-10-CM | POA: Diagnosis not present

## 2021-08-05 DIAGNOSIS — N1831 Chronic kidney disease, stage 3a: Secondary | ICD-10-CM | POA: Diagnosis present

## 2021-08-05 DIAGNOSIS — E785 Hyperlipidemia, unspecified: Secondary | ICD-10-CM | POA: Diagnosis present

## 2021-08-05 DIAGNOSIS — H9193 Unspecified hearing loss, bilateral: Secondary | ICD-10-CM | POA: Diagnosis present

## 2021-08-05 DIAGNOSIS — I2721 Secondary pulmonary arterial hypertension: Secondary | ICD-10-CM | POA: Diagnosis present

## 2021-08-05 DIAGNOSIS — I129 Hypertensive chronic kidney disease with stage 1 through stage 4 chronic kidney disease, or unspecified chronic kidney disease: Secondary | ICD-10-CM | POA: Diagnosis not present

## 2021-08-05 DIAGNOSIS — Z794 Long term (current) use of insulin: Secondary | ICD-10-CM | POA: Diagnosis not present

## 2021-08-05 LAB — GLUCOSE, CAPILLARY
Glucose-Capillary: 121 mg/dL — ABNORMAL HIGH (ref 70–99)
Glucose-Capillary: 168 mg/dL — ABNORMAL HIGH (ref 70–99)

## 2021-08-05 MED ORDER — DOXYCYCLINE HYCLATE 100 MG PO TABS: 100.0000 mg | ORAL_TABLET | Freq: Two times a day (BID) | ORAL | 0 refills | Status: AC

## 2021-08-05 MED ORDER — COVID-19 MRNA VAC-TRIS(PFIZER) 30 MCG/0.3ML IM SUSP
0.3000 mL | Freq: Once | INTRAMUSCULAR | Status: AC
  Administered 2021-08-05: 0.3 mL via INTRAMUSCULAR
  Filled 2021-08-05: qty 0.3

## 2021-08-05 MED ORDER — PREDNISONE 10 MG PO TABS: ORAL_TABLET | ORAL | 0 refills | Status: DC

## 2021-08-05 NOTE — Telephone Encounter (Signed)
Doris from [facility] is calling to check on the status of orders they faxed over on 8/24.Please call her [phone] She stated the best fax number of [phone] to send the order to.Please advise.

## 2021-08-05 NOTE — TOC Transition Note (Signed)
Transition of Care [facility]) - CM/SW Discharge Note   Patient Details  Name: Jessica Erickson MRN: [id number] Date of Birth: 04/17/1926  Transition of Care [facility]) CM/SW Contact:  Shelbie Hutching, RN Phone Number: 08/05/2021, 10:49 AM   Clinical Narrative:    Patient is medically cleared for discharge to [facility] today.  Family is aware of discharge today.  [facility] requested patient not discharge until 2pm.  [facility] has been arranged for 2pm.  Patient will be going to room 505.  Bedside RN will call report.    Final next level of care: Skilled Nursing Facility Barriers to Discharge: Barriers Resolved   Patient Goals and CMS Choice Patient states their goals for this hospitalization and ongoing recovery are:: Family agrees with SNF and chooses [employer].gov Compare Post Acute Care list provided to:: Patient Represenative (must comment) Choice offered to / list presented to : Adult Children  Discharge Placement   Existing PASRR number confirmed : 08/09/21          Patient chooses bed at: [facility] Patient to be transferred to facility by: [facility] Name of family member notified: [name] and [name] Patient and family notified of of transfer: 08/05/21  Discharge Plan and Services   Discharge Planning Services: CM Consult Post Acute Care Choice: [place]          DME Arranged: N/A DME Agency: NA                  Social Determinants of Health ([place]) Interventions     Readmission Risk Interventions No flowsheet data found.

## 2021-08-05 NOTE — Progress Notes (Signed)
Report called to Doctor, general practice at [facility]. Awaiting PTAR for transport.

## 2021-08-05 NOTE — Telephone Encounter (Signed)
Called and spoke with Jessica Erickson and most recent scanned form from [facility] has been refaxed. They have been sent to [phone] and the back up fax of [phone]. Jessica Erickson was informed of the loss of quality due to being scanned and printed. Jessica Erickson verbalized understanding.

## 2021-08-05 NOTE — Plan of Care (Signed)
Problem: Education: Goal: Knowledge of General Education information will improve Description: Including pain rating scale, medication(s)/side effects and non-pharmacologic comfort measures 08/05/2021 1458 by Ronita Hipps, RN Outcome: Adequate for Discharge 08/05/2021 1458 by Ronita Hipps, RN Outcome: Adequate for Discharge 08/05/2021 1458 by Ronita Hipps, RN Outcome: Adequate for Discharge   Problem: Health Behavior/Discharge Planning: Goal: Ability to manage health-related needs will improve 08/05/2021 1458 by Ronita Hipps, RN Outcome: Adequate for Discharge 08/05/2021 1458 by Ronita Hipps, RN Outcome: Adequate for Discharge 08/05/2021 1458 by Ronita Hipps, RN Outcome: Adequate for Discharge   Problem: Clinical Measurements: Goal: Ability to maintain clinical measurements within normal limits will improve 08/05/2021 1458 by Ronita Hipps, RN Outcome: Adequate for Discharge 08/05/2021 1458 by Ronita Hipps, RN Outcome: Adequate for Discharge 08/05/2021 1458 by Ronita Hipps, RN Outcome: Adequate for Discharge Goal: Will remain free from infection 08/05/2021 1458 by Ronita Hipps, RN Outcome: Adequate for Discharge 08/05/2021 1458 by Ronita Hipps, RN Outcome: Adequate for Discharge 08/05/2021 1458 by Ronita Hipps, RN Outcome: Adequate for Discharge Goal: Diagnostic test results will improve 08/05/2021 1458 by Ronita Hipps, RN Outcome: Adequate for Discharge 08/05/2021 1458 by Ronita Hipps, RN Outcome: Adequate for Discharge 08/05/2021 1458 by Ronita Hipps, RN Outcome: Adequate for Discharge Goal: Respiratory complications will improve 08/05/2021 1458 by Ronita Hipps, RN Outcome: Adequate for Discharge 08/05/2021 1458 by Ronita Hipps, RN Outcome: Adequate for Discharge 08/05/2021 1458 by Ronita Hipps, RN Outcome: Adequate for Discharge Goal: Cardiovascular complication will be  avoided 08/05/2021 1458 by Ronita Hipps, RN Outcome: Adequate for Discharge 08/05/2021 1458 by Ronita Hipps, RN Outcome: Adequate for Discharge 08/05/2021 1458 by Ronita Hipps, RN Outcome: Adequate for Discharge   Problem: Activity: Goal: Risk for activity intolerance will decrease 08/05/2021 1458 by Ronita Hipps, RN Outcome: Adequate for Discharge 08/05/2021 1458 by Ronita Hipps, RN Outcome: Adequate for Discharge 08/05/2021 1458 by Ronita Hipps, RN Outcome: Adequate for Discharge   Problem: Nutrition: Goal: Adequate nutrition will be maintained 08/05/2021 1458 by Ronita Hipps, RN Outcome: Adequate for Discharge 08/05/2021 1458 by Ronita Hipps, RN Outcome: Adequate for Discharge 08/05/2021 1458 by Ronita Hipps, RN Outcome: Adequate for Discharge   Problem: Coping: Goal: Level of anxiety will decrease 08/05/2021 1458 by Ronita Hipps, RN Outcome: Adequate for Discharge 08/05/2021 1458 by Ronita Hipps, RN Outcome: Adequate for Discharge 08/05/2021 1458 by Ronita Hipps, RN Outcome: Adequate for Discharge   Problem: Elimination: Goal: Will not experience complications related to bowel motility 08/05/2021 1458 by Ronita Hipps, RN Outcome: Adequate for Discharge 08/05/2021 1458 by Ronita Hipps, RN Outcome: Adequate for Discharge 08/05/2021 1458 by Ronita Hipps, RN Outcome: Adequate for Discharge Goal: Will not experience complications related to urinary retention 08/05/2021 1458 by Ronita Hipps, RN Outcome: Adequate for Discharge 08/05/2021 1458 by Ronita Hipps, RN Outcome: Adequate for Discharge 08/05/2021 1458 by Ronita Hipps, RN Outcome: Adequate for Discharge   Problem: Pain Managment: Goal: General experience of comfort will improve 08/05/2021 1458 by Ronita Hipps, RN Outcome: Adequate for Discharge 08/05/2021 1458 by Ronita Hipps, RN Outcome: Adequate for  Discharge 08/05/2021 1458 by Ronita Hipps, RN Outcome: Adequate for Discharge   Problem: Safety: Goal: Ability to remain free from injury will improve 08/05/2021 1458 by Ronita Hipps, RN Outcome: Adequate for Discharge 08/05/2021 1458 by Ronita Hipps,  RN Outcome: Adequate for Discharge 08/05/2021 1458 by Ronita Hipps, RN Outcome: Adequate for Discharge   Problem: Skin Integrity: Goal: Risk for impaired skin integrity will decrease 08/05/2021 1458 by Ronita Hipps, RN Outcome: Adequate for Discharge 08/05/2021 1458 by Ronita Hipps, RN Outcome: Adequate for Discharge 08/05/2021 1458 by Ronita Hipps, RN Outcome: Adequate for Discharge   Problem: Acute Rehab PT Goals(only PT should resolve) Goal: Pt Will Go Supine/Side To Sit Outcome: Adequate for Discharge Goal: Patient Will Transfer Sit To/From Stand Outcome: Adequate for Discharge Goal: Pt Will Transfer Bed To Chair/Chair To Bed Outcome: Adequate for Discharge Goal: Pt Will Ambulate Outcome: Adequate for Discharge Goal: Pt Will Go Up/Down Stairs Outcome: Adequate for Discharge Goal: Pt/caregiver will Perform Home Exercise Program Outcome: Adequate for Discharge   Problem: Increased Nutrient Needs (NI-5.1) Goal: Food and/or nutrient delivery Description: Individualized approach for food/nutrient provision. Outcome: Adequate for Discharge   Problem: Acute Rehab OT Goals (only OT should resolve) Goal: Pt. Will Perform Lower Body Dressing Outcome: Adequate for Discharge Goal: Pt. Will Transfer To Toilet Outcome: Adequate for Discharge Goal: Pt. Will Perform Toileting-Clothing Manipulation Outcome: Adequate for Discharge

## 2021-08-05 NOTE — Progress Notes (Signed)
Pt discharged to [facility] via [place].

## 2021-08-05 NOTE — Progress Notes (Signed)
[facility] PROGRESS NOTE    Jessica Erickson  NIO:[id number] DOB: [birth year] DOA: 08/01/2021 PCP: McLean-Scocuzza, Nino Glow, MD      Brief Narrative:  [name] is a 87 y.o. F with COPD, CAD, DM, DVT on Eliquis, history of CVA without residuals, and acute stroke female with hearing loss who presents with shortness of breath and right leg pain.  In the ER, CXR clear, wheezing on exam. X-ray and Doppler ultrasound were unremarkable.  She was admitted for management of severe right leg pain.        Assessment & Plan:  COPD Chronic hypoxic respiratory failure -Continue pred -Continue doxy -Continue bronchodilators  -Continue ICS/LABA/LAMA   HTN BP normal -Hold lasix  Hypothyroidism - Continue LT4  CKD IIIb Cr stable relative to baseline  DVT -Continue Eliquis  Obesity BMI 35   Chronic polyneuropathy - Continue gabapentin  Mood disorder - Continue mirtazapine, melatonin            Disposition: Status is: Inpatient  Remains inpatient appropriate because:Unsafe d/c plan  Dispo: The patient is from: Home              Anticipated d/c is to: SNF              Patient currently is medically stable to d/c.   Difficult to place patient No   [name]    Level of care: Med-Surg       MDM: The below labs and imaging reports were reviewed and summarized above.  Medication management as above.    DVT prophylaxis: apixaban (ELIQUIS) tablet 2.5 mg Start: 08/01/21 2300 apixaban (ELIQUIS) tablet 2.5 mg  Code Status: DNR Family Communication:              Subjective: No headache, chest pain, dyspnea, abdominal pain, confusion.  Objective: Vitals:   08/04/21 2053 08/05/21 0011 08/05/21 0625 08/05/21 0848  BP: (!) 114/43 (!) 125/45 (!) 141/60   Pulse: 64 61 (!) 51   Resp: 17 18 16    Temp: 98.3 F (36.8 C) 98.2 F (36.8 C) (!) 97.5 F (36.4 C)   TempSrc: Oral  Oral Oral   SpO2:  91% 96% 94%  Weight:      Height:        Intake/Output Summary (Last 24 hours) at 08/05/2021 0916 Last data filed at 08/05/2021 8182 Gross per 24 hour  Intake 720 ml  Output 750 ml  Net -30 ml   Filed Weights   08/02/21 0235 08/02/21 1400  Weight: 101.6 kg 101.6 kg    Examination: General appearance: Elderly female, hard of hearing, alert and interactive    HEENT:     Skin:  . Cardiac: RRR, soft systolic murmur, no LE edema. Respiratory: normal respiratory rate and rhythm, no loud wheezing Abdomen:    MSK:   Neuro:   Psych: Appropriate mentation    Data Reviewed: I have personally reviewed following labs and imaging studies:  CBC: Recent Labs  Lab 08/01/21 1542 08/02/21 0413 08/03/21 0514 08/04/21 0509  WBC 7.3 3.6* 7.2 7.8  HGB 13.4 13.1 12.2 12.0  HCT 42.1 40.5 37.9 37.2  MCV 94.8 93.1 92.4 92.3  PLT 218 180 182 993   Basic Metabolic Panel: Recent Labs  Lab 08/01/21 1542 08/02/21 0413 08/03/21 0514 08/04/21 0509  NA 142 138 140 139  K 3.7 3.9 4.5 4.0  CL 100 102 101 101  CO2 34* 29 34*  32  GLUCOSE 129* 242* 181* 172*  BUN 21 20 28* 35*  CREATININE 1.34* 1.20* 1.16* 1.23*  CALCIUM 8.7* 8.4* 8.5* 8.4*   GFR: Estimated Creatinine Clearance: 35 mL/min (A) (by C-G formula based on SCr of 1.23 mg/dL (H)). Liver Function Tests: Recent Labs  Lab 08/01/21 1542 08/02/21 0413  AST 25 22  ALT 17 16  ALKPHOS 68 61  BILITOT 0.7 0.6  PROT 6.9 6.5  ALBUMIN 3.5 3.2*   No results for input(s): LIPASE, AMYLASE in the last 168 hours. No results for input(s): AMMONIA in the last 168 hours. Coagulation Profile: No results for input(s): INR, PROTIME in the last 168 hours. Cardiac Enzymes: No results for input(s): CKTOTAL, CKMB, CKMBINDEX, TROPONINI in the last 168 hours. BNP (last 3 results) No results for input(s): PROBNP in the last 8760 hours. HbA1C: No results for input(s): HGBA1C in the last 72 hours.  CBG: Recent Labs  Lab  08/04/21 0716 08/04/21 1150 08/04/21 1621 08/04/21 1954 08/05/21 0749  GLUCAP 133* 111* 234* 244* 121*   Lipid Profile: No results for input(s): CHOL, HDL, LDLCALC, TRIG, CHOLHDL, LDLDIRECT in the last 72 hours. Thyroid Function Tests: No results for input(s): TSH, T4TOTAL, FREET4, T3FREE, THYROIDAB in the last 72 hours. Anemia Panel: No results for input(s): VITAMINB12, FOLATE, FERRITIN, TIBC, IRON, RETICCTPCT in the last 72 hours. Urine analysis:    Component Value Date/Time   COLORURINE YELLOW 06/08/2021 1600   APPEARANCEUR CLEAR 06/08/2021 1600   APPEARANCEUR Cloudy (A) 02/27/2019 1055   LABSPEC 1.015 06/08/2021 1600   PHURINE 7.0 06/08/2021 1600   GLUCOSEU NEGATIVE 06/08/2021 1600   GLUCOSEU NEGATIVE 12/24/2017 0931   HGBUR NEGATIVE 06/08/2021 1600   BILIRUBINUR NEGATIVE 06/08/2021 1600   BILIRUBINUR Negative 02/27/2019 1055   KETONESUR NEGATIVE 06/08/2021 1600   PROTEINUR NEGATIVE 06/08/2021 1600   UROBILINOGEN 0.2 12/26/2018 1523   UROBILINOGEN 0.2 12/24/2017 0931   NITRITE NEGATIVE 06/08/2021 1600   LEUKOCYTESUR NEGATIVE 06/08/2021 1600   Sepsis Labs: @LABRCNTIP (procalcitonin:4,lacticacidven:4)  ) Recent Results (from the past 240 hour(s))  Resp Panel by RT-PCR (Flu A&B, Covid) Nasopharyngeal Swab     Status: None   Collection Time: 08/01/21  4:35 PM   Specimen: Nasopharyngeal Swab; Nasopharyngeal(NP) swabs in vial transport medium  Result Value Ref Range Status   SARS Coronavirus 2 by RT PCR NEGATIVE NEGATIVE Final    Comment: (NOTE) SARS-CoV-2 target nucleic acids are NOT DETECTED.  The SARS-CoV-2 RNA is generally detectable in upper respiratory specimens during the acute phase of infection. The lowest concentration of SARS-CoV-2 viral copies this assay can detect is 138 copies/mL. A negative result does not preclude SARS-Cov-2 infection and should not be used as the sole basis for treatment or other patient management decisions. A negative result may  occur with  improper specimen collection/handling, submission of specimen other than nasopharyngeal swab, presence of viral mutation(s) within the areas targeted by this assay, and inadequate number of viral copies(<138 copies/mL). A negative result must be combined with clinical observations, patient history, and epidemiological information. The expected result is Negative.  Fact Sheet for Patients:  EntrepreneurPulse.com.au  Fact Sheet for Healthcare Providers:  IncredibleEmployment.be  This test is no t yet approved or cleared by the [place] FDA and  has been authorized for detection and/or diagnosis of SARS-CoV-2 by FDA under an Emergency Use Authorization (EUA). This EUA will remain  in effect (meaning this test can be used) for the duration of the COVID-19 declaration under Section 564(b)(1) of the Act,  21 U.S.C.section 360bbb-3(b)(1), unless the authorization is terminated  or revoked sooner.       Influenza A by PCR NEGATIVE NEGATIVE Final   Influenza B by PCR NEGATIVE NEGATIVE Final    Comment: (NOTE) The Xpert Xpress SARS-CoV-2/FLU/RSV plus assay is intended as an aid in the diagnosis of influenza from Nasopharyngeal swab specimens and should not be used as a sole basis for treatment. Nasal washings and aspirates are unacceptable for Xpert Xpress SARS-CoV-2/FLU/RSV testing.  Fact Sheet for Patients: EntrepreneurPulse.com.au  Fact Sheet for Healthcare Providers: IncredibleEmployment.be  This test is not yet approved or cleared by the [place] FDA and has been authorized for detection and/or diagnosis of SARS-CoV-2 by FDA under an Emergency Use Authorization (EUA). This EUA will remain in effect (meaning this test can be used) for the duration of the COVID-19 declaration under Section 564(b)(1) of the Act, 21 U.S.C. section 360bbb-3(b)(1), unless the authorization is terminated  or revoked.  Performed at [facility], [address] [place], [place] [address]   Resp Panel by RT-PCR (Flu A&B, Covid) Nasopharyngeal Swab     Status: None   Collection Time: 08/04/21 10:50 PM   Specimen: Nasopharyngeal Swab; Nasopharyngeal(NP) swabs in vial transport medium  Result Value Ref Range Status   SARS Coronavirus 2 by RT PCR NEGATIVE NEGATIVE Final    Comment: (NOTE) SARS-CoV-2 target nucleic acids are NOT DETECTED.  The SARS-CoV-2 RNA is generally detectable in upper respiratory specimens during the acute phase of infection. The lowest concentration of SARS-CoV-2 viral copies this assay can detect is 138 copies/mL. A negative result does not preclude SARS-Cov-2 infection and should not be used as the sole basis for treatment or other patient management decisions. A negative result may occur with  improper specimen collection/handling, submission of specimen other than nasopharyngeal swab, presence of viral mutation(s) within the areas targeted by this assay, and inadequate number of viral copies(<138 copies/mL). A negative result must be combined with clinical observations, patient history, and epidemiological information. The expected result is Negative.  Fact Sheet for Patients:  EntrepreneurPulse.com.au  Fact Sheet for Healthcare Providers:  IncredibleEmployment.be  This test is no t yet approved or cleared by the [place] FDA and  has been authorized for detection and/or diagnosis of SARS-CoV-2 by FDA under an Emergency Use Authorization (EUA). This EUA will remain  in effect (meaning this test can be used) for the duration of the COVID-19 declaration under Section 564(b)(1) of the Act, 21 U.S.C.section 360bbb-3(b)(1), unless the authorization is terminated  or revoked sooner.       Influenza A by PCR NEGATIVE NEGATIVE Final   Influenza B by PCR NEGATIVE NEGATIVE Final    Comment: (NOTE) The Xpert  Xpress SARS-CoV-2/FLU/RSV plus assay is intended as an aid in the diagnosis of influenza from Nasopharyngeal swab specimens and should not be used as a sole basis for treatment. Nasal washings and aspirates are unacceptable for Xpert Xpress SARS-CoV-2/FLU/RSV testing.  Fact Sheet for Patients: EntrepreneurPulse.com.au  Fact Sheet for Healthcare Providers: IncredibleEmployment.be  This test is not yet approved or cleared by the [place] FDA and has been authorized for detection and/or diagnosis of SARS-CoV-2 by FDA under an Emergency Use Authorization (EUA). This EUA will remain in effect (meaning this test can be used) for the duration of the COVID-19 declaration under Section 564(b)(1) of the Act, 21 U.S.C. section 360bbb-3(b)(1), unless the authorization is terminated or revoked.  Performed at [facility], [address], [place], [place] [address]  Radiology Studies: No results found.      Scheduled Meds:  apixaban  2.5 mg Oral BID   atorvastatin  40 mg Oral Daily   calcium-vitamin D  1 tablet Oral BID   clotrimazole-betamethasone  1 application Topical BID   docusate  50 mg Oral Daily   doxycycline  100 mg Oral Q12H   feeding supplement (GLUCERNA SHAKE)  237 mL Oral BID BM   umeclidinium bromide  1 puff Inhalation Daily   And   fluticasone furoate-vilanterol  1 puff Inhalation Daily   furosemide  40 mg Oral Daily   gabapentin  400 mg Oral TID   insulin aspart  0-9 Units Subcutaneous TID WC   ipratropium-albuterol  3 mL Nebulization TID   levothyroxine  200 mcg Oral Once per day on Mon Tue Wed Thu Fri Sat   lidocaine  1 patch Transdermal Q24H   loratadine  10 mg Oral Daily   melatonin  10 mg Oral QHS   mirtazapine  15 mg Oral QHS   multivitamin with minerals  1 tablet Oral Daily   pantoprazole  40 mg Oral Daily   polyethylene glycol  17 g Oral Daily   polyvinyl alcohol  1 drop Both Eyes QHS    predniSONE  40 mg Oral Q breakfast   Continuous Infusions:   LOS: 4 days    Time spent: 15 minutes    Edwin Dada, MD [facility] 08/05/2021, 9:16 AM     Please page though AMION or Epic secure chat:  For [employer], Adult nurse

## 2021-08-09 DIAGNOSIS — E119 Type 2 diabetes mellitus without complications: Secondary | ICD-10-CM | POA: Diagnosis not present

## 2021-08-09 DIAGNOSIS — M79604 Pain in right leg: Secondary | ICD-10-CM | POA: Diagnosis not present

## 2021-08-09 DIAGNOSIS — J9611 Chronic respiratory failure with hypoxia: Secondary | ICD-10-CM | POA: Diagnosis not present

## 2021-08-09 DIAGNOSIS — J449 Chronic obstructive pulmonary disease, unspecified: Secondary | ICD-10-CM | POA: Diagnosis not present

## 2021-08-09 DIAGNOSIS — Z794 Long term (current) use of insulin: Secondary | ICD-10-CM | POA: Diagnosis not present

## 2021-08-13 ENCOUNTER — Inpatient Hospital Stay
Admission: EM | Admit: 2021-08-13 | Discharge: 2021-08-19 | DRG: 190 | Disposition: A | Discharge status: Skilled Nursing Facility | Payer: Medicare PPO | Attending: Student | Admitting: Student

## 2021-08-13 ENCOUNTER — Emergency Department: Payer: Medicare PPO

## 2021-08-13 ENCOUNTER — Other Ambulatory Visit: Payer: Self-pay

## 2021-08-13 DIAGNOSIS — N3946 Mixed incontinence: Secondary | ICD-10-CM | POA: Diagnosis present

## 2021-08-13 DIAGNOSIS — F419 Anxiety disorder, unspecified: Secondary | ICD-10-CM | POA: Diagnosis present

## 2021-08-13 DIAGNOSIS — N1831 Chronic kidney disease, stage 3a: Secondary | ICD-10-CM | POA: Diagnosis present

## 2021-08-13 DIAGNOSIS — J9611 Chronic respiratory failure with hypoxia: Secondary | ICD-10-CM | POA: Diagnosis not present

## 2021-08-13 DIAGNOSIS — I5189 Other ill-defined heart diseases: Secondary | ICD-10-CM

## 2021-08-13 DIAGNOSIS — J810 Acute pulmonary edema: Secondary | ICD-10-CM | POA: Diagnosis not present

## 2021-08-13 DIAGNOSIS — Z880 Allergy status to penicillin: Secondary | ICD-10-CM

## 2021-08-13 DIAGNOSIS — J690 Pneumonitis due to inhalation of food and vomit: Secondary | ICD-10-CM | POA: Diagnosis present

## 2021-08-13 DIAGNOSIS — J9621 Acute and chronic respiratory failure with hypoxia: Secondary | ICD-10-CM | POA: Diagnosis present

## 2021-08-13 DIAGNOSIS — Z8249 Family history of ischemic heart disease and other diseases of the circulatory system: Secondary | ICD-10-CM

## 2021-08-13 DIAGNOSIS — I130 Hypertensive heart and chronic kidney disease with heart failure and stage 1 through stage 4 chronic kidney disease, or unspecified chronic kidney disease: Secondary | ICD-10-CM | POA: Diagnosis present

## 2021-08-13 DIAGNOSIS — R0902 Hypoxemia: Secondary | ICD-10-CM | POA: Diagnosis present

## 2021-08-13 DIAGNOSIS — J441 Chronic obstructive pulmonary disease with (acute) exacerbation: Principal | ICD-10-CM | POA: Diagnosis present

## 2021-08-13 DIAGNOSIS — Z66 Do not resuscitate: Secondary | ICD-10-CM | POA: Diagnosis present

## 2021-08-13 DIAGNOSIS — Z20822 Contact with and (suspected) exposure to covid-19: Secondary | ICD-10-CM | POA: Diagnosis present

## 2021-08-13 DIAGNOSIS — E039 Hypothyroidism, unspecified: Secondary | ICD-10-CM

## 2021-08-13 DIAGNOSIS — R0602 Shortness of breath: Secondary | ICD-10-CM | POA: Diagnosis present

## 2021-08-13 DIAGNOSIS — I2510 Atherosclerotic heart disease of native coronary artery without angina pectoris: Secondary | ICD-10-CM | POA: Diagnosis present

## 2021-08-13 DIAGNOSIS — F32A Depression, unspecified: Secondary | ICD-10-CM | POA: Diagnosis present

## 2021-08-13 DIAGNOSIS — D72829 Elevated white blood cell count, unspecified: Secondary | ICD-10-CM | POA: Diagnosis present

## 2021-08-13 DIAGNOSIS — Z8673 Personal history of transient ischemic attack (TIA), and cerebral infarction without residual deficits: Secondary | ICD-10-CM

## 2021-08-13 DIAGNOSIS — E785 Hyperlipidemia, unspecified: Secondary | ICD-10-CM | POA: Diagnosis present

## 2021-08-13 DIAGNOSIS — G500 Trigeminal neuralgia: Secondary | ICD-10-CM | POA: Diagnosis present

## 2021-08-13 DIAGNOSIS — I959 Hypotension, unspecified: Secondary | ICD-10-CM | POA: Diagnosis not present

## 2021-08-13 DIAGNOSIS — I5033 Acute on chronic diastolic (congestive) heart failure: Secondary | ICD-10-CM | POA: Diagnosis present

## 2021-08-13 DIAGNOSIS — Z7901 Long term (current) use of anticoagulants: Secondary | ICD-10-CM

## 2021-08-13 DIAGNOSIS — R609 Edema, unspecified: Secondary | ICD-10-CM | POA: Diagnosis not present

## 2021-08-13 DIAGNOSIS — M1990 Unspecified osteoarthritis, unspecified site: Secondary | ICD-10-CM | POA: Diagnosis present

## 2021-08-13 DIAGNOSIS — J479 Bronchiectasis, uncomplicated: Secondary | ICD-10-CM | POA: Diagnosis not present

## 2021-08-13 DIAGNOSIS — Z882 Allergy status to sulfonamides status: Secondary | ICD-10-CM

## 2021-08-13 DIAGNOSIS — T380X5A Adverse effect of glucocorticoids and synthetic analogues, initial encounter: Secondary | ICD-10-CM | POA: Diagnosis present

## 2021-08-13 DIAGNOSIS — J189 Pneumonia, unspecified organism: Secondary | ICD-10-CM | POA: Diagnosis not present

## 2021-08-13 DIAGNOSIS — G4733 Obstructive sleep apnea (adult) (pediatric): Secondary | ICD-10-CM | POA: Diagnosis present

## 2021-08-13 DIAGNOSIS — E1140 Type 2 diabetes mellitus with diabetic neuropathy, unspecified: Secondary | ICD-10-CM | POA: Diagnosis not present

## 2021-08-13 DIAGNOSIS — J811 Chronic pulmonary edema: Secondary | ICD-10-CM | POA: Diagnosis not present

## 2021-08-13 DIAGNOSIS — Z888 Allergy status to other drugs, medicaments and biological substances status: Secondary | ICD-10-CM

## 2021-08-13 DIAGNOSIS — I2721 Secondary pulmonary arterial hypertension: Secondary | ICD-10-CM | POA: Diagnosis present

## 2021-08-13 DIAGNOSIS — Z7189 Other specified counseling: Secondary | ICD-10-CM | POA: Diagnosis not present

## 2021-08-13 DIAGNOSIS — G4700 Insomnia, unspecified: Secondary | ICD-10-CM | POA: Diagnosis present

## 2021-08-13 DIAGNOSIS — Z87891 Personal history of nicotine dependence: Secondary | ICD-10-CM

## 2021-08-13 DIAGNOSIS — N183 Chronic kidney disease, stage 3 unspecified: Secondary | ICD-10-CM

## 2021-08-13 DIAGNOSIS — I700 Atherosclerosis of aorta: Secondary | ICD-10-CM | POA: Diagnosis not present

## 2021-08-13 DIAGNOSIS — R069 Unspecified abnormalities of breathing: Secondary | ICD-10-CM | POA: Diagnosis not present

## 2021-08-13 DIAGNOSIS — Z86718 Personal history of other venous thrombosis and embolism: Secondary | ICD-10-CM

## 2021-08-13 DIAGNOSIS — Z833 Family history of diabetes mellitus: Secondary | ICD-10-CM

## 2021-08-13 DIAGNOSIS — K219 Gastro-esophageal reflux disease without esophagitis: Secondary | ICD-10-CM | POA: Diagnosis present

## 2021-08-13 DIAGNOSIS — J9620 Acute and chronic respiratory failure, unspecified whether with hypoxia or hypercapnia: Secondary | ICD-10-CM | POA: Diagnosis present

## 2021-08-13 DIAGNOSIS — Z6832 Body mass index (BMI) 32.0-32.9, adult: Secondary | ICD-10-CM | POA: Diagnosis not present

## 2021-08-13 DIAGNOSIS — R5381 Other malaise: Secondary | ICD-10-CM | POA: Diagnosis present

## 2021-08-13 DIAGNOSIS — H9193 Unspecified hearing loss, bilateral: Secondary | ICD-10-CM

## 2021-08-13 DIAGNOSIS — Z79890 Hormone replacement therapy (postmenopausal): Secondary | ICD-10-CM

## 2021-08-13 DIAGNOSIS — Z86011 Personal history of benign neoplasm of the brain: Secondary | ICD-10-CM

## 2021-08-13 DIAGNOSIS — I10 Essential (primary) hypertension: Secondary | ICD-10-CM | POA: Diagnosis present

## 2021-08-13 DIAGNOSIS — E669 Obesity, unspecified: Secondary | ICD-10-CM | POA: Diagnosis present

## 2021-08-13 DIAGNOSIS — J45909 Unspecified asthma, uncomplicated: Secondary | ICD-10-CM | POA: Diagnosis not present

## 2021-08-13 DIAGNOSIS — Z7951 Long term (current) use of inhaled steroids: Secondary | ICD-10-CM

## 2021-08-13 DIAGNOSIS — Z79899 Other long term (current) drug therapy: Secondary | ICD-10-CM

## 2021-08-13 DIAGNOSIS — Z515 Encounter for palliative care: Secondary | ICD-10-CM | POA: Diagnosis not present

## 2021-08-13 DIAGNOSIS — R531 Weakness: Secondary | ICD-10-CM | POA: Diagnosis not present

## 2021-08-13 DIAGNOSIS — R0600 Dyspnea, unspecified: Secondary | ICD-10-CM | POA: Diagnosis not present

## 2021-08-13 DIAGNOSIS — Z9981 Dependence on supplemental oxygen: Secondary | ICD-10-CM | POA: Diagnosis not present

## 2021-08-13 DIAGNOSIS — Z886 Allergy status to analgesic agent status: Secondary | ICD-10-CM

## 2021-08-13 DIAGNOSIS — E1122 Type 2 diabetes mellitus with diabetic chronic kidney disease: Secondary | ICD-10-CM | POA: Diagnosis not present

## 2021-08-13 DIAGNOSIS — J449 Chronic obstructive pulmonary disease, unspecified: Secondary | ICD-10-CM | POA: Diagnosis present

## 2021-08-13 LAB — COMPREHENSIVE METABOLIC PANEL
ALT: 16 U/L (ref 0–44)
AST: 32 U/L (ref 15–41)
Albumin: 2.6 g/dL — ABNORMAL LOW (ref 3.5–5.0)
Alkaline Phosphatase: 59 U/L (ref 38–126)
Anion gap: 7 (ref 5–15)
BUN: 19 mg/dL (ref 8–23)
CO2: 27 mmol/L (ref 22–32)
Calcium: 7 mg/dL — ABNORMAL LOW (ref 8.9–10.3)
Chloride: 104 mmol/L (ref 98–111)
Creatinine, Ser: 1.04 mg/dL — ABNORMAL HIGH (ref 0.44–1.00)
GFR, Estimated: 50 mL/min — ABNORMAL LOW (ref >60)
Glucose, Bld: 147 mg/dL — ABNORMAL HIGH (ref 70–99)
Potassium: 4 mmol/L (ref 3.5–5.1)
Sodium: 138 mmol/L (ref 135–145)
Total Bilirubin: 1.6 mg/dL — ABNORMAL HIGH (ref 0.3–1.2)
Total Protein: 5.1 g/dL — ABNORMAL LOW (ref 6.5–8.1)

## 2021-08-13 LAB — RESP PANEL BY RT-PCR (FLU A&B, COVID) ARPGX2
Influenza A by PCR: NEGATIVE
Influenza B by PCR: NEGATIVE
SARS Coronavirus 2 by RT PCR: NEGATIVE

## 2021-08-13 LAB — URINALYSIS, COMPLETE (UACMP) WITH MICROSCOPIC
Bacteria, UA: NONE SEEN
Bilirubin Urine: NEGATIVE
Glucose, UA: NEGATIVE mg/dL
Hgb urine dipstick: NEGATIVE
Ketones, ur: NEGATIVE mg/dL
Leukocytes,Ua: NEGATIVE
Nitrite: NEGATIVE
Protein, ur: NEGATIVE mg/dL
Specific Gravity, Urine: 1.015 (ref 1.005–1.030)
pH: 7.5 (ref 5.0–8.0)

## 2021-08-13 LAB — BRAIN NATRIURETIC PEPTIDE: B Natriuretic Peptide: 152.7 pg/mL — ABNORMAL HIGH (ref 0.0–100.0)

## 2021-08-13 LAB — CBC
HCT: 42 % (ref 36.0–46.0)
Hemoglobin: 13.9 g/dL (ref 12.0–15.0)
MCH: 31.2 pg (ref 26.0–34.0)
MCHC: 33.1 g/dL (ref 30.0–36.0)
MCV: 94.4 fL (ref 80.0–100.0)
Platelets: 192 10*3/uL (ref 150–400)
RBC: 4.45 MIL/uL (ref 3.87–5.11)
RDW: 14.8 % (ref 11.5–15.5)
WBC: 23.6 10*3/uL — ABNORMAL HIGH (ref 4.0–10.5)
nRBC: 0 % (ref 0.0–0.2)

## 2021-08-13 LAB — PROCALCITONIN: Procalcitonin: 0.13 ng/mL

## 2021-08-13 LAB — LACTIC ACID, PLASMA
Lactic Acid, Venous: 1.5 mmol/L (ref 0.5–1.9)
Lactic Acid, Venous: 1 mmol/L (ref 0.5–1.9)

## 2021-08-13 LAB — TROPONIN I (HIGH SENSITIVITY): Troponin I (High Sensitivity): 17 ng/L (ref <18)

## 2021-08-13 MED ORDER — ALBUTEROL SULFATE (2.5 MG/3ML) 0.083% IN NEBU
2.5000 mg | INHALATION_SOLUTION | Freq: Three times a day (TID) | RESPIRATORY_TRACT | Status: DC
Start: 2021-08-13 — End: 2021-08-14
  Administered 2021-08-13 – 2021-08-14 (×2): 2.5 mg via RESPIRATORY_TRACT
  Filled 2021-08-13 (×2): qty 3

## 2021-08-13 MED ORDER — ONDANSETRON HCL 4 MG PO TABS: 4.0000 mg | ORAL_TABLET | Freq: Four times a day (QID) | ORAL | Status: DC | PRN

## 2021-08-13 MED ORDER — LORATADINE 10 MG PO TABS
10.0000 mg | ORAL_TABLET | Freq: Every day | ORAL | Status: DC
  Administered 2021-08-14 – 2021-08-19 (×6): 10 mg via ORAL
  Filled 2021-08-13 (×6): qty 1

## 2021-08-13 MED ORDER — FUROSEMIDE 10 MG/ML IJ SOLN
40.0000 mg | Freq: Once | INTRAMUSCULAR | Status: AC
  Administered 2021-08-13: 40 mg via INTRAVENOUS
  Filled 2021-08-13: qty 4

## 2021-08-13 MED ORDER — ADULT MULTIVITAMIN W/MINERALS CH
1.0000 | ORAL_TABLET | Freq: Every day | ORAL | Status: DC
  Administered 2021-08-13 – 2021-08-19 (×7): 1 via ORAL
  Filled 2021-08-13 (×7): qty 1

## 2021-08-13 MED ORDER — PANTOPRAZOLE SODIUM 40 MG PO TBEC
40.0000 mg | DELAYED_RELEASE_TABLET | Freq: Every day | ORAL | Status: DC
  Administered 2021-08-13 – 2021-08-19 (×7): 40 mg via ORAL
  Filled 2021-08-13 (×7): qty 1

## 2021-08-13 MED ORDER — POLYETHYLENE GLYCOL 3350 17 G PO PACK
17.0000 g | PACK | Freq: Every day | ORAL | Status: DC
  Administered 2021-08-17 – 2021-08-19 (×3): 17 g via ORAL
  Filled 2021-08-13 (×6): qty 1

## 2021-08-13 MED ORDER — IPRATROPIUM-ALBUTEROL 0.5-2.5 (3) MG/3ML IN SOLN
3.0000 mL | Freq: Once | RESPIRATORY_TRACT | Status: DC
  Filled 2021-08-13: qty 3

## 2021-08-13 MED ORDER — MIRTAZAPINE 15 MG PO TABS
15.0000 mg | ORAL_TABLET | Freq: Every day | ORAL | Status: DC
  Administered 2021-08-13 – 2021-08-18 (×6): 15 mg via ORAL
  Filled 2021-08-13 (×6): qty 1

## 2021-08-13 MED ORDER — FLUTICASONE FUROATE-VILANTEROL 100-25 MCG/INH IN AEPB
1.0000 | INHALATION_SPRAY | Freq: Every day | RESPIRATORY_TRACT | Status: DC
  Administered 2021-08-14 – 2021-08-19 (×6): 1 via RESPIRATORY_TRACT
  Filled 2021-08-13 (×3): qty 28

## 2021-08-13 MED ORDER — ONDANSETRON HCL 4 MG/2ML IJ SOLN
4.0000 mg | Freq: Four times a day (QID) | INTRAMUSCULAR | Status: DC | PRN
Start: 2021-08-13 — End: 2021-08-19

## 2021-08-13 MED ORDER — ACETAMINOPHEN 650 MG RE SUPP: 650.0000 mg | Freq: Four times a day (QID) | RECTAL | Status: AC | PRN

## 2021-08-13 MED ORDER — ACETAMINOPHEN 325 MG PO TABS
650.0000 mg | ORAL_TABLET | Freq: Four times a day (QID) | ORAL | Status: AC | PRN
  Filled 2021-08-13 (×2): qty 2

## 2021-08-13 MED ORDER — MELATONIN 5 MG PO TABS
10.0000 mg | ORAL_TABLET | Freq: Every day | ORAL | Status: DC
  Administered 2021-08-14 – 2021-08-18 (×5): 10 mg via ORAL
  Filled 2021-08-13 (×7): qty 2

## 2021-08-13 MED ORDER — ENOXAPARIN SODIUM 40 MG/0.4ML IJ SOSY: 40.0000 mg | PREFILLED_SYRINGE | INTRAMUSCULAR | Status: DC

## 2021-08-13 MED ORDER — GABAPENTIN 300 MG PO CAPS
400.0000 mg | ORAL_CAPSULE | Freq: Three times a day (TID) | ORAL | Status: DC
  Administered 2021-08-13 – 2021-08-14 (×4): 400 mg via ORAL
  Filled 2021-08-13 (×4): qty 1

## 2021-08-13 MED ORDER — APIXABAN 2.5 MG PO TABS
2.5000 mg | ORAL_TABLET | Freq: Two times a day (BID) | ORAL | Status: DC
  Administered 2021-08-13 – 2021-08-19 (×12): 2.5 mg via ORAL
  Filled 2021-08-13 (×12): qty 1

## 2021-08-13 MED ORDER — IPRATROPIUM-ALBUTEROL 0.5-2.5 (3) MG/3ML IN SOLN
3.0000 mL | Freq: Four times a day (QID) | RESPIRATORY_TRACT | Status: DC | PRN
Start: 2021-08-13 — End: 2021-08-14

## 2021-08-13 MED ORDER — LEVOTHYROXINE SODIUM 50 MCG PO TABS
200.0000 ug | ORAL_TABLET | Freq: Every day | ORAL | Status: DC
  Administered 2021-08-15 – 2021-08-19 (×5): 200 ug via ORAL
  Filled 2021-08-13 (×5): qty 4

## 2021-08-13 MED ORDER — ATORVASTATIN CALCIUM 20 MG PO TABS
40.0000 mg | ORAL_TABLET | Freq: Every day | ORAL | Status: DC
  Administered 2021-08-13 – 2021-08-18 (×6): 40 mg via ORAL
  Filled 2021-08-13 (×5): qty 2

## 2021-08-13 MED ORDER — GABAPENTIN 300 MG PO CAPS: 400.0000 mg | ORAL_CAPSULE | Freq: Three times a day (TID) | ORAL | Status: DC

## 2021-08-13 MED ORDER — GUAIFENESIN ER 600 MG PO TB12: 1200.0000 mg | ORAL_TABLET | Freq: Two times a day (BID) | ORAL | Status: DC | PRN

## 2021-08-13 MED ORDER — UMECLIDINIUM BROMIDE 62.5 MCG/INH IN AEPB
1.0000 | INHALATION_SPRAY | Freq: Every day | RESPIRATORY_TRACT | Status: DC
  Administered 2021-08-14 – 2021-08-19 (×6): 1 via RESPIRATORY_TRACT
  Filled 2021-08-13 (×2): qty 7

## 2021-08-13 MED ORDER — FLUTICASONE-UMECLIDIN-VILANT 100-62.5-25 MCG/INH IN AEPB: 1.0000 | INHALATION_SPRAY | Freq: Every day | RESPIRATORY_TRACT | Status: DC

## 2021-08-13 NOTE — Progress Notes (Signed)
Patient denies , pain, states is short of breath . Respirations 30 , Rhonchi breath sounds throughout. Oxygen 3.5 l via nasal cannula. Respiratory therapist notified.

## 2021-08-13 NOTE — ED Provider Notes (Signed)
[facility] Emergency Department Provider Note   ____________________________________________    I have reviewed the triage vital signs and the nursing notes.   HISTORY  Chief Complaint Weakness   History limited  HPI Jessica Erickson is a 87 y.o. female with history HOH and unable to talk.  Reported history of COPD, reportedly on 2 L at baseline.  Notified staff at her facility today that she was not feeling well, they found her to be hypoxic and she was referred to the emergency department.   Past Medical History:  Diagnosis Date   Acoustic neuroma (HCC)    Allergy    Asthma    Bilateral swelling of feet    and legs   Bladder infection    CAD (coronary artery disease)    Cataract    Change in voice    Compression fracture of body of thoracic vertebra (HCC)    T12 09/18/15 MRI s/p fall    Constipation    COPD (chronic obstructive pulmonary disease) (HCC)    previous CXR with chronic interstitial lung dz    CVA (cerebral vascular accident) ([place])    Depression    Diabetes ([place])    with neuropathy   Diabetes mellitus, type 2 ([place])    Diarrhea    Double vision    DVT (deep venous thrombosis) ([place])    right leg 10/2015 was on coumadin off as of 2017/2018 ; s/p IVC filter   Enuresis    Eye pain, right    Fall    Fatty liver    09/15/15 also mildly dilated pancreatitic duct rec MRCP small sub cm cyst hemangioma speeln mild right hydronephrorossi and prox. hydroureter, kidney stones, mild scarring kidneys   Female stress incontinence    Flank pain    GERD (gastroesophageal reflux disease)    with small hiatal hernia    Hard of hearing    Heart disease    History of kidney problems    Hyperlipidemia    mixed   Hypertension    Hypothyroidism, postsurgical    Impaired mobility and ADLs    uses rolling walker has caretaker 24/7 at home   Leg edema    Mixed incontinence urge and stress (female)(female)    Neuropathy    Osteoarthritis     DDD spine    Osteoporosis with fracture    T12 compression fracture   Photophobia    Pulmonary embolism ([place])    10/2015 off coumadin as of 04/2016   Pulmonary HTN (HCC)    mild pulm HTN, echo 10/09/15 EF 78-29%FAOZH 1 dd, RV systolic pressure increased    Recurrent UTI    Sinus pressure    Skin cancer    BCC jawline and scalp    Thyroid disease    follows [facility]   TIA (transient ischemic attack)    MRI 2009/2010 neg stroke    Trigeminal neuralgia    Dr. Tomi Bamberger s/p gamma knife x 2, on Tegretol since 2011/2012 no increase in dose >200 mg bid rec per family per neurology    Urinary frequency    Urinary, incontinence, stress female    Dr Erlene Quan urology     Patient Active Problem List   Diagnosis Date Noted   Prediabetes 06/29/2021   Pulmonary artery hypertension ([place]) 05/03/2021   Stage 4 chronic kidney disease ([place]) 03/29/2021   Grade I diastolic dysfunction 08/65/7846   Obesity (BMI 30-39.9) 08/16/2020   Chronic kidney disease, stage  3b ([place]) 04/06/2020   Thrombocytopenia ([place]) 04/06/2020   Seborrheic keratoses 04/06/2020   Hip pain 09/30/2019   History of deep vein thrombosis (DVT) of lower extremity 09/30/2019   Arthritis 09/30/2019   Fall 03/11/2019   Epistaxis 12/11/2018   History of DVT (deep vein thrombosis) 11/12/2018   Bilateral leg edema 06/04/2018   Bilateral hearing loss 06/04/2018   Actinic keratosis 06/04/2018   Bronchiectasis ([place]) 06/04/2018   Depression 05/17/2018   Dehydration 05/15/2018   Hypertension 05/03/2018   Lymphedema 05/02/2018   Iron deficiency anemia 05/02/2018   Physical deconditioning 05/02/2018   Drug interaction 05/02/2018   Supratherapeutic INR 05/02/2018   Presence of IVC filter 05/02/2018   Anemia 04/11/2018   Fatigue 04/11/2018   Moderate protein-calorie malnutrition ([place]) 03/22/2018   Orthostasis 03/05/2018   Dizziness 03/04/2018   COPD (chronic obstructive pulmonary disease) ([place]) 12/13/2017   Anxiety and  depression 12/13/2017   Trigeminal neuralgia 12/13/2017   GERD (gastroesophageal reflux disease) 12/13/2017   Type 2 diabetes mellitus with stage 3b chronic kidney disease, without long-term current use of insulin ([place]) 12/13/2017   Constipation 12/13/2017   Insomnia 12/13/2017   Hypothyroidism 12/13/2017   UTI (urinary tract infection) 10/08/2015   Low back pain 10/08/2015   Collapsed vertebra, not elsewhere classified, thoracic region, initial encounter for fracture ([place]) 09/20/2015   Basal cell carcinoma of scalp 04/06/2014   Fothergill's neuralgia 08/05/2013   Bladder infection, chronic 02/11/2013   Female genuine stress incontinence 02/11/2013   Incomplete bladder emptying 02/11/2013   Intrinsic sphincter deficiency 02/11/2013   Mixed incontinence 02/11/2013   Excessive urination at night 02/11/2013   Bladder retention 02/11/2013   FOM (frequency of micturition) 02/11/2013   Basal cell carcinoma of face 09/13/2011    Past Surgical History:  Procedure Laterality Date   APPENDECTOMY     as a child, open   BRAIN SURGERY     schwnnoma removal 1996    brain tumor surgery     BREAST SURGERY     breast bx   CATARACT EXTRACTION     CHOLECYSTECTOMY     EYE SURGERY     cataract   IVC FILTER PLACEMENT ([place] [facility])     Dr. Lucky Cowboy 10/2015    LAPAROSCOPIC TUBAL LIGATION     MOHS SURGERY     scalp 04/2014    PERIPHERAL VASCULAR CATHETERIZATION N/A 10/11/2015   Procedure: IVC Filter Insertion;  Surgeon: Algernon Huxley, MD;  Location: [facility];  Service: Cardiovascular;  Laterality: N/A;   PERIPHERAL VASCULAR THROMBECTOMY Bilateral 03/29/2018   Procedure: PERIPHERAL VASCULAR THROMBECTOMY;  Surgeon: Algernon Huxley, MD;  Location: [facility];  Service: Cardiovascular;  Laterality: Bilateral;   PUBOVAGINAL SLING     THROAT SURGERY     THYROID SURGERY     tumor around vocal cords    TOOTH EXTRACTION     winter 2018    TOTAL THYROIDECTOMY  1976    Prior to Admission  medications   Medication Sig Start Date End Date Taking? Authorizing Provider  acetaminophen (TYLENOL) 325 MG tablet Take 2 tablets (650 mg total) by mouth every 6 (six) hours as needed. 07/30/20   McLean-Scocuzza, Nino Glow, MD  acetaminophen-codeine (TYLENOL #3) 300-30 MG tablet Take 1 tablet by mouth 2 (two) times daily as needed for moderate pain. 08/04/21   Danford, Suann Larry, MD  albuterol (PROVENTIL) (2.5 MG/3ML) 0.083% nebulizer solution Take 3 mLs (2.5 mg total) by nebulization in the morning, at noon,  and at bedtime. 08/04/21   Danford, Suann Larry, MD  apixaban (ELIQUIS) 2.5 MG TABS tablet Take 1 tablet (2.5 mg total) by mouth 2 (two) times daily. 03/29/21   McLean-Scocuzza, Nino Glow, MD  atorvastatin (LIPITOR) 40 MG tablet Take 1 tablet (40 mg total) by mouth daily at 6 PM. Generic ok Patient taking differently: Take 40 mg by mouth daily. Generic ok 03/29/21   McLean-Scocuzza, Nino Glow, MD  BISACODYL PO Take by mouth.    [provider]  Calcium Carbonate-Vitamin D (CALCIUM 600+D) 600-400 MG-UNIT tablet Take 1 tablet by mouth 2 (two) times daily. Lunch and dinner 07/23/19   McLean-Scocuzza, Nino Glow, MD  cetirizine (ZYRTEC) 10 MG tablet Take 1 tablet (10 mg total) by mouth daily. 03/29/21   McLean-Scocuzza, Nino Glow, MD  clotrimazole-betamethasone (LOTRISONE) lotion Apply 1 application topically 2 (two) times daily. 10/11/20   [provider]  Docusate Sodium (DOK PO) Take 1 tablet by mouth daily.    [provider]  feeding supplement, GLUCERNA SHAKE, (GLUCERNA SHAKE) LIQD Take 237 mLs by mouth 2 (two) times daily between meals. 08/04/21   Danford, Suann Larry, MD  Fluticasone-Umeclidin-Vilant (TRELEGY ELLIPTA) 100-62.5-25 MCG/INH AEPB Inhale 1 puff into the lungs daily. Rinse mouth d/c symbicort and spiriva 03/29/21   McLean-Scocuzza, Nino Glow, MD  gabapentin (NEURONTIN) 400 MG capsule Take 1 capsule (400 mg total) by mouth 3 (three) times daily. 11/11/20   McLean-Scocuzza,  Nino Glow, MD  guaiFENesin (MUCINEX) 600 MG 12 hr tablet Take 2 tablets (1,200 mg total) by mouth 2 (two) times daily as needed for cough or to loosen phlegm. 05/05/21   Chesley Mires, MD  levothyroxine (SYNTHROID) 200 MCG tablet Take 1 tablet (200 mcg total) by mouth daily before breakfast. Except on Sunday. Do not take with other medications or vitamins 03/29/21   McLean-Scocuzza, Nino Glow, MD  Melatonin 10 MG TABS Take 10 mg by mouth at bedtime. 04/19/21   McLean-Scocuzza, Nino Glow, MD  mirtazapine (REMERON) 15 MG tablet Take 1 tablet (15 mg total) by mouth at bedtime. 03/29/21   McLean-Scocuzza, Nino Glow, MD  Multiple Vitamin (MULTIVITAMIN WITH MINERALS) TABS tablet Take 1 tablet by mouth daily.     [provider]  pantoprazole (PROTONIX) 40 MG tablet Take 1 tablet (40 mg total) by mouth daily. 30 minutes before lunch or dinner 03/29/21   McLean-Scocuzza, Nino Glow, MD  polyethylene glycol powder (GLYCOLAX/MIRALAX) 17 GM/SCOOP powder Take 17 g by mouth daily. 04/06/20   McLean-Scocuzza, Nino Glow, MD  polyvinyl alcohol (LIQUIFILM TEARS) 1.4 % ophthalmic solution Place 1 drop into both eyes at bedtime. 08/04/21   Danford, Suann Larry, MD  predniSONE (DELTASONE) 10 MG tablet Take 40 mg for 1 day then 30 mg for 2 days then 20 mg for 2 days then 10 mg for 2 days then stop 08/05/21   Danford, Suann Larry, MD     Allergies Penicillins, Sulfa antibiotics, Amitiza [lubiprostone], Aspirin, and Penicillin g  Family History  Problem Relation Age of Onset   Heart disease Mother    Diabetes Father    Cancer Daughter        breast ca x 2 s/p mastectomy     Social History Social History   Tobacco Use   Smoking status: Former    Packs/day: 0.50    Years: 20.00    Pack years: 10.00    Types: Cigarettes    Quit date: 09/20/1995    Years since quitting: 25.9   Smokeless tobacco:  Never   Tobacco comments:    quit 1996 smoked 20 years max 8 cig qd   Vaping Use   Vaping Use: Never used  Substance Use  Topics   Alcohol use: No   Drug use: No    Limited review of Systems  Constitutional: No fever  Cardiovascular: Denies chest pain. Respiratory: Positive cough Gastrointestinal: No abdominal pain.  No nausea, no vomiting.       ____________________________________________   PHYSICAL EXAM:  VITAL SIGNS: ED Triage Vitals  Enc Vitals Group     BP 08/13/21 1251 (!) 119/49     Pulse Rate 08/13/21 1251 (!) 57     Resp 08/13/21 1257 15     Temp 08/13/21 1336 98.1 F (36.7 C)     Temp Source 08/13/21 1336 Oral     SpO2 08/13/21 1251 96 %     Weight --      Height --      Head Circumference --      Peak Flow --      Pain Score --      Pain Loc --      Pain Edu? --      Excl. in [place]? --     Constitutional: Alert  Eyes: Conjunctivae are normal.  Head: Atraumatic. Nose: No congestion/rhinnorhea. Mouth/Throat: Mucous membranes are dry Neck:  Painless ROM Cardiovascular: Normal rate, regular rhythm. Grossly normal heart sounds.  Good peripheral circulation. Respiratory: Normal respiratory effort.  No retractions.  No significant wheezing, bibasilar Rales Gastrointestinal: Soft and nontender. No distention.   Musculoskeletal:   Warm and well perfused Neurologic:  Normal speech and language. No gross focal neurologic deficits are appreciated.  Skin:  Skin is warm, dry and intact. No rash noted. Psychiatric: Mood and affect are normal. Speech and behavior are normal.  ____________________________________________   LABS (all labs ordered are listed, but only abnormal results are displayed)  Labs Reviewed  CBC - Abnormal; Notable for the following components:      Result Value   WBC 23.6 (*)    All other components within normal limits  COMPREHENSIVE METABOLIC PANEL - Abnormal; Notable for the following components:   Glucose, Bld 147 (*)    Creatinine, Ser 1.04 (*)    Calcium 7.0 (*)    Total Protein 5.1 (*)    Albumin 2.6 (*)    Total Bilirubin 1.6 (*)    GFR,  Estimated 50 (*)    All other components within normal limits  CULTURE, BLOOD (SINGLE)  RESP PANEL BY RT-PCR (FLU A&B, COVID) ARPGX2  LACTIC ACID, PLASMA  LACTIC ACID, PLASMA  URINALYSIS, COMPLETE (UACMP) WITH MICROSCOPIC  PROCALCITONIN  CBG MONITORING, ED  TROPONIN I (HIGH SENSITIVITY)   ____________________________________________  EKG  ED ECG REPORT I, Lavonia Drafts, the attending physician, personally viewed and interpreted this ECG.  Date: 08/13/2021  Rhythm: normal sinus rhythm QRS Axis: normal Intervals: normal ST/T Wave abnormalities: normal Narrative Interpretation: no evidence of acute ischemia  ____________________________________________  RADIOLOGY  Chest x-ray reviewed by me, consistent with vascular congestion ____________________________________________   PROCEDURES  Procedure(s) performed: No  Procedures   Critical Care performed: No ____________________________________________   INITIAL IMPRESSION / ASSESSMENT AND PLAN / ED COURSE  Pertinent labs & imaging results that were available during my care of the patient were reviewed by me and considered in my medical decision making (see chart for details).   Patient with history as noted above who presents with not feeling well.  History severely  limited.  However notably the patient is apparently routinely on 2 L nasal cannula, oxygen saturations in the mid to high 80s on 2 L, we have increased her to 3 to 4 L to keep her saturations above 90%.  Her white blood cell count is significantly elevated at 23.6, however reviewing medical records the patient was discharged on a prednisone taper on 1 September.  Her elevated white blood cell count could be related to prednisone or possible infection however no evidence of pneumonia on chest x-ray, pending lactic, urinalysis  Lactic acid is normal, have ordered a procalcitonin as well.  I suspect her elevated white blood cell count is related to prednisone  use.  I have given DuoNeb we will consult the hospitalist for admission for further management     ____________________________________________   FINAL CLINICAL IMPRESSION(S) / ED DIAGNOSES  Final diagnoses:  Acute on chronic respiratory failure with hypoxia ([place])        Note:  This document was prepared using Dragon voice recognition software and may include unintentional dictation errors.    Lavonia Drafts, MD 08/13/21 1425

## 2021-08-13 NOTE — ED Triage Notes (Signed)
Pt comes via [facility] from [facility] with c/o not feeling well. Pt is HOH and unable to talk. Pt uses a white board and apparently wrote that she wasn't feeling well on it. Staff called 911. Staff also reported 88% RA for O2.  EMS reports 92% 3L [place]. Pt has hx of COPD. Cbg-229 HR-79 RR-[22 BP-107/46 20 g left arm

## 2021-08-13 NOTE — H&P (Signed)
History and Physical   [name] GYJ:[id number] DOB: October 25, 1926 DOA: 08/13/2021  PCP: McLean-Scocuzza, Nino Glow, MD  Outpatient Specialists: Dr. Halford Chessman Patient coming from: [facility] via [facility]  I have personally briefly reviewed patient's old medical records in [place].  Chief Concern: Not feeling well  HPI: Jessica Erickson is a 87 y.o. female with medical history significant for severe bilateral hearing loss, COPD, history of CVA with no residual deficits, hypothyroid, history of DVT on Eliquis, chronic bibasilar pulmonary scarring, CKD 3A, chronic respiratory failure requiring 2 L nasal cannula oxygen supplementation, presents to the emergency department for chief concerns of not feeling well.  At bedside, patient responds to sternal rub, loud verbal stimuli.  She weakly opens her eyes.  Her eyes have a glass/glazed appearance.  As she sleeps her mouth is open with a rattling sound that is heard when I stand approximately 3 to 4 feet away.  There is no increase use of respiratory effort and or respiratory muscle use.  Patient is currently on 3.5 L nasal cannula.  Her mouth appears dry.  She weakly follow commands by squeezing my hands bilaterally with equal strength.  She is able to move her feet and her toes at my request.  Patient wakes to sternal rub and quickly closes her eyes.  She is hard of hearing. On my evaluation, I presented a clipboard with various questions in large print for her to read and potentially answer. I gave her a writing pen and she was able to hold it with her right hand.  She was not able to write due to presumed profound weakness.  She can place the pen on the paper but was not able to write.  Social history: Patient is from [place].  Patient has history of tobacco use. She quit in 1996.  At her peak she smoked half a pack per day for 20 years.    Vaccination history: Patient has completed 3 Pfizer vaccines, 12/15/2019, 01/05/2020, 08/05/2021  ROS:  Unable to complete as patient is too weak to right with a pen. Constitutional: no weight change, no fever ENT/Mouth: no sore throat, no rhinorrhea Eyes: no eye pain, no vision changes Cardiovascular: no chest pain, no dyspnea,  no edema, no palpitations Respiratory: no cough, no sputum, no wheezing Gastrointestinal: no nausea, no vomiting, no diarrhea, no constipation Genitourinary: no urinary incontinence, no dysuria, no hematuria Musculoskeletal: no arthralgias, no myalgias Skin: no skin lesions, no pruritus, Neuro: + weakness, no loss of consciousness, no syncope Psych: no anxiety, no depression, + decrease appetite Heme/Lymph: no bruising, no bleeding  ED Course: Discussed with emergency medicine provider, patient requiring hospitalization for chief concerns of hypoxia on baseline nasal cannula.  Vitals in the emergency department was remarkable for temperature of 98.1, respiration rate of 15, heart rate of 77, blood pressure 119/49, SPO2 of 90% on 3 L nasal cannula.  Labs in the emergency department was remarkable for serum sodium 138, potassium 4.0, chloride 104, bicarb 27, BUN 19, serum creatinine of 1.04, nonfasting blood glucose 147, GFR of 50.  WBC 23.6, hemoglobin 13.9, platelets 192.  High-sensitivity troponin was 17, lactic acid was within normal limits.  Portable chest x-ray in the emergency department was read as pulmonary vascular congestion.  In the emergency department patient was given furosemide 40 mg IV once, and DuoNeb treatment once.  COVID/influenza A/influenza B PCR were negative.  Assessment/Plan  Principal Problem:   Shortness of breath Active Problems:   COPD (chronic  obstructive pulmonary disease) (HCC)   Anxiety and depression   Trigeminal neuralgia   GERD (gastroesophageal reflux disease)   Insomnia   Hypothyroid   Physical deconditioning   Hypertension   Hearing loss of both ears   History of DVT (deep vein thrombosis)   Arthritis   Grade I  diastolic dysfunction   Obesity (BMI 30-39.9)   Pulmonary artery hypertension (HCC)   CKD (chronic kidney disease), stage III (HCC)   History of CVA (cerebrovascular accident)   # Shortness of breath and hypoxia-presumed secondary to pulmonary edema, however differentials include COPD exacerbation # COPD # Pulmonary hypertension - Check BNP - Status post furosemide 40 mg IV once, DuoNeb treatment - Resumed home scheduled albuterol nebulizers 2.5 mg 3 times daily, home maintenance inhaler, trilogy home - Duo nebs 3 mL nebulizer every 6 hours as needed for wheezing and shortness of breath - Mr. Jessica Erickson, POA, does not wish to pursue further imaging with CT of the chest/abdomen/pelvis - Aspiration precautions  # Patient meets SIRS criteria with documented increased respiration rate  # Leukocytosis of 23.6-I suspect this is secondary to reactive and demyelination of white cell given that patient recently had hospitalization where she was given Solu-Medrol and prednisone - I do not suspect [name] is clinically septic - Procalcitonin is 0.13, lactic acid was 1.5, high-sensitivity troponin was 17 - At this time I am deferring a second blood culture and broad-spectrum antibiotic given that an additional needlestick would cause unnecessary break in patient's natural skin barrier and cause additional pain without adding meaningful benefits.  Broad antibiotic spectrum is not indicated clinically indicated in weighing the risk and benefits, I believe broad-spectrum antibiotic would cause more harm in generating increased risk of antibiotic resistance - Fluid bolus/IVF are not indicated at this time given patient has pulmonary edema driving increased respiratory distress - Check urine analysis  # Trigeminal neuralgia - Patient was previously on Tegretol/carbamazepine, however this was discontinued by her neurologist due to medication reaction/incompatibility with her current blood thinners -  Patient was then converted to gabapentin and gabapentin has helped kept her symptoms under  - Resume home gabapentin 400 mg TID, Mr. Jessica Erickson is aware that gabapentin has the potential to suppress respiratory drive and at this time he does not want to potentially subject his mother to trigeminal neuralgia pain  # Pulmonary hypertension # Obstructive sleep apnea - CPAP nightly ordered # Bilateral hearing loss # GERD-PPI resumed  # Hypothyroid-resumed home levothyroxine 200 mcg daily before breakfast # Insomnia-melatonin 10 mg nightly # History of DVT-Eliquis 2.5 mg BID resumed # History of CVA # CKD stage IIIa-at baseline at this time # Anxiety # Depression # CAD # History of tobacco use, 10-pack-year-quit in 1996 # Extensive end-of-life care discussion-with Mr. Jalena Vanderlinden, POA at bedside and via phone calls  Per POA, patient's daughter, who is an Therapist, sports and other son, who is a physician, will be coming in.  Mr. Yaslin Kirtley is the healthcare power of attorney, and he would like to make a major decisions together along with his brother and sister.Marland Kitchen  SLP ordered A.m. labs: Magnesium, phosphorus, B1, B12, vitamin D, TSH, CBC, BMP  Chart reviewed.   TSH on 03/29/2021: 1.33  Complete echo 05/27/2021: Estimated ejection fraction was 60 to 65%.  Grade 1 diastolic dysfunction, impaired relaxation.  RVSP mildly elevated at approximately 30 to 35 mmHg.  Left atrial size mildly dilated.  Right atrial size was normal.  There is no evidence  of pericardial effusion.  DVT prophylaxis: Eliquis 2.5 mg twice daily Code Status: DNR/DNI Diet: N.p.o. except for ice chips and sips with meds, pending SLP  Family Communication: Called son, Mr. Sabreen Kitchen.  Extensive discussion with Mr. [place] Welcome at bedside Disposition Plan: Pending clinical course Consults called: None at this time Admission status: MedSurg, obs, telemetry  Past Medical History:  Diagnosis Date   Acoustic neuroma [facility])    Allergy     Asthma    Bilateral swelling of feet    and legs   Bladder infection    CAD (coronary artery disease)    Cataract    Change in voice    Compression fracture of body of thoracic vertebra ([place])    T12 09/18/15 MRI s/p fall    Constipation    COPD (chronic obstructive pulmonary disease) ([place])    previous CXR with chronic interstitial lung dz    CVA (cerebral vascular accident) ([place])    Depression    Diabetes ([place])    with neuropathy   Diabetes mellitus, type 2 ([place])    Diarrhea    Double vision    DVT (deep venous thrombosis) ([place])    right leg 10/2015 was on coumadin off as of 2017/2018 ; s/p IVC filter   Enuresis    Eye pain, right    Fall    Fatty liver    09/15/15 also mildly dilated pancreatitic duct rec MRCP small sub cm cyst hemangioma speeln mild right hydronephrorossi and prox. hydroureter, kidney stones, mild scarring kidneys   Female stress incontinence    Flank pain    GERD (gastroesophageal reflux disease)    with small hiatal hernia    Hard of hearing    Heart disease    History of kidney problems    Hyperlipidemia    mixed   Hypertension    Hypothyroidism, postsurgical    Impaired mobility and ADLs    uses rolling walker has caretaker 24/7 at home   Leg edema    Mixed incontinence urge and stress (female)(female)    Neuropathy    Osteoarthritis    DDD spine    Osteoporosis with fracture    T12 compression fracture   Photophobia    Pulmonary embolism ([place])    10/2015 off coumadin as of 04/2016   Pulmonary HTN (HCC)    mild pulm HTN, echo 10/09/15 EF 10-25%ENIDP 1 dd, RV systolic pressure increased    Recurrent UTI    Sinus pressure    Skin cancer    BCC jawline and scalp    Thyroid disease    follows [facility]   TIA (transient ischemic attack)    MRI 2009/2010 neg stroke    Trigeminal neuralgia    Dr. Tomi Bamberger s/p gamma knife x 2, on Tegretol since 2011/2012 no increase in dose >200 mg bid rec per family per neurology    Urinary frequency     Urinary, incontinence, stress female    Dr Erlene Quan urology    Past Surgical History:  Procedure Laterality Date   APPENDECTOMY     as a child, open   BRAIN SURGERY     schwnnoma removal 1996    brain tumor surgery     BREAST SURGERY     breast bx   CATARACT EXTRACTION     CHOLECYSTECTOMY     EYE SURGERY     cataract   IVC FILTER PLACEMENT ([place] HX)     Dr. Lucky Cowboy 10/2015  LAPAROSCOPIC TUBAL LIGATION     MOHS SURGERY     scalp 04/2014    PERIPHERAL VASCULAR CATHETERIZATION N/A 10/11/2015   Procedure: IVC Filter Insertion;  Surgeon: Algernon Huxley, MD;  Location: [facility];  Service: Cardiovascular;  Laterality: N/A;   PERIPHERAL VASCULAR THROMBECTOMY Bilateral 03/29/2018   Procedure: PERIPHERAL VASCULAR THROMBECTOMY;  Surgeon: Algernon Huxley, MD;  Location: [facility];  Service: Cardiovascular;  Laterality: Bilateral;   PUBOVAGINAL SLING     THROAT SURGERY     THYROID SURGERY     tumor around vocal cords    TOOTH EXTRACTION     winter 2018    TOTAL THYROIDECTOMY  1976   Social History:  reports that she quit smoking about 25 years ago. Her smoking use included cigarettes. She has a 10.00 pack-year smoking history. She has never used smokeless tobacco. She reports that she does not drink alcohol and does not use drugs.  Allergies  Allergen Reactions   Penicillins Shortness Of Breath, Rash and Other (See Comments)    Has patient had a PCN reaction causing immediate rash, facial/tongue/throat swelling, SOB or lightheadedness with hypotension: Yes Has patient had a PCN reaction causing severe rash involving mucus membranes or skin necrosis: No Has patient had a PCN reaction that required hospitalization No Has patient had a PCN reaction occurring within the last 10 years: No If all of the above answers are "NO", then may proceed with Cephalosporin use.   Sulfa Antibiotics Shortness Of Breath, Rash and Other (See Comments)   Amitiza [Lubiprostone]     N/v/d    Aspirin Other (See Comments)    Reaction:  Unknown  Other reaction(s): Bleeding (intolerance) Per patient " causes nose to bleed" Can take 81 mg daily without any complications Other reaction(s): "bloody nose"    Penicillin G Other (See Comments)    Has patient had a PCN reaction causing immediate rash, facial/tongue/throat swelling, SOB or lightheadedness with hypotension: No Has patient had a PCN reaction causing severe rash involving mucus membranes or skin necrosis: Unknown Has patient had a PCN reaction that required hospitalization: Unknown Has patient had a PCN reaction occurring within the last 10 years: Unknown If all of the above answers are "NO", then may proceed with Cephalosporin use.   Family History  Problem Relation Age of Onset   Heart disease Mother    Diabetes Father    Cancer Daughter        breast ca x 2 s/p mastectomy    Family history: Family history reviewed and not pertinent  Prior to Admission medications   Medication Sig Start Date End Date Taking? Authorizing Provider  acetaminophen (TYLENOL) 325 MG tablet Take 2 tablets (650 mg total) by mouth every 6 (six) hours as needed. 07/30/20   McLean-Scocuzza, Nino Glow, MD  acetaminophen-codeine (TYLENOL #3) 300-30 MG tablet Take 1 tablet by mouth 2 (two) times daily as needed for moderate pain. 08/04/21   Danford, Suann Larry, MD  albuterol (PROVENTIL) (2.5 MG/3ML) 0.083% nebulizer solution Take 3 mLs (2.5 mg total) by nebulization in the morning, at noon, and at bedtime. 08/04/21   Danford, Suann Larry, MD  apixaban (ELIQUIS) 2.5 MG TABS tablet Take 1 tablet (2.5 mg total) by mouth 2 (two) times daily. 03/29/21   McLean-Scocuzza, Nino Glow, MD  atorvastatin (LIPITOR) 40 MG tablet Take 1 tablet (40 mg total) by mouth daily at 6 PM. Generic ok Patient taking differently: Take 40 mg by mouth daily. Generic  ok 03/29/21   McLean-Scocuzza, Nino Glow, MD  BISACODYL PO Take by mouth.    [provider]  Calcium  Carbonate-Vitamin D (CALCIUM 600+D) 600-400 MG-UNIT tablet Take 1 tablet by mouth 2 (two) times daily. Lunch and dinner 07/23/19   McLean-Scocuzza, Nino Glow, MD  cetirizine (ZYRTEC) 10 MG tablet Take 1 tablet (10 mg total) by mouth daily. 03/29/21   McLean-Scocuzza, Nino Glow, MD  clotrimazole-betamethasone (LOTRISONE) lotion Apply 1 application topically 2 (two) times daily. 10/11/20   [provider]  Docusate Sodium (DOK PO) Take 1 tablet by mouth daily.    [provider]  feeding supplement, GLUCERNA SHAKE, (GLUCERNA SHAKE) LIQD Take 237 mLs by mouth 2 (two) times daily between meals. 08/04/21   Danford, Suann Larry, MD  Fluticasone-Umeclidin-Vilant (TRELEGY ELLIPTA) 100-62.5-25 MCG/INH AEPB Inhale 1 puff into the lungs daily. Rinse mouth d/c symbicort and spiriva 03/29/21   McLean-Scocuzza, Nino Glow, MD  gabapentin (NEURONTIN) 400 MG capsule Take 1 capsule (400 mg total) by mouth 3 (three) times daily. 11/11/20   McLean-Scocuzza, Nino Glow, MD  guaiFENesin (MUCINEX) 600 MG 12 hr tablet Take 2 tablets (1,200 mg total) by mouth 2 (two) times daily as needed for cough or to loosen phlegm. 05/05/21   Chesley Mires, MD  levothyroxine (SYNTHROID) 200 MCG tablet Take 1 tablet (200 mcg total) by mouth daily before breakfast. Except on Sunday. Do not take with other medications or vitamins 03/29/21   McLean-Scocuzza, Nino Glow, MD  Melatonin 10 MG TABS Take 10 mg by mouth at bedtime. 04/19/21   McLean-Scocuzza, Nino Glow, MD  mirtazapine (REMERON) 15 MG tablet Take 1 tablet (15 mg total) by mouth at bedtime. 03/29/21   McLean-Scocuzza, Nino Glow, MD  Multiple Vitamin (MULTIVITAMIN WITH MINERALS) TABS tablet Take 1 tablet by mouth daily.     [provider]  pantoprazole (PROTONIX) 40 MG tablet Take 1 tablet (40 mg total) by mouth daily. 30 minutes before lunch or dinner 03/29/21   McLean-Scocuzza, Nino Glow, MD  polyethylene glycol powder (GLYCOLAX/MIRALAX) 17 GM/SCOOP powder Take 17 g by mouth daily.  04/06/20   McLean-Scocuzza, Nino Glow, MD  polyvinyl alcohol (LIQUIFILM TEARS) 1.4 % ophthalmic solution Place 1 drop into both eyes at bedtime. 08/04/21   Danford, Suann Larry, MD  predniSONE (DELTASONE) 10 MG tablet Take 40 mg for 1 day then 30 mg for 2 days then 20 mg for 2 days then 10 mg for 2 days then stop 08/05/21   Edwin Dada, MD   Physical Exam: Vitals:   08/13/21 1251 08/13/21 1257 08/13/21 1336  BP: (!) 119/49  (!) 120/45  Pulse: (!) 57 77 73  Resp:  15 (!) 30  Temp:   98.1 F (36.7 C)  TempSrc:   Oral  SpO2: 96% 90% 92%   Constitutional: appears age-appropriate, frail, NAD, calm, comfortable Eyes: PERRL, glazed/glassed eyes, patient blinks  ENMT: Mucous membranes are dry. Posterior pharynx clear of any exudate. Mouth open and breathing Age-appropriate dentition. Severe hearing loss. Nasal cannula in place  Neck: normal, supple, no masses, no thyromegaly Respiratory: bilaterally rhonchi,  + crackles, + rattles. Normal respiratory effort. No accessory muscle use.  Cardiovascular: Regular rate and rhythm, no murmurs / rubs / gallops. 2+ pedal pulses. No carotid bruits. Bilateral lower extremity trace edema Abdomen: suprapubic tenderness, no masses palpated, no hepatosplenomegaly. Bowel sounds positive.  Musculoskeletal: no clubbing / cyanosis. No joint deformity upper and lower extremities. Good ROM, no contractures, no atrophy. Normal muscle tone.  Skin:  no rashes, lesions, ulcers.  Pale and ashen skinned Neurologic: Sensation intact. Strength 4/5 in all 4.  Psychiatric: Unable to assess udgment and insight. .  Unable to assess mood.  Opens eyes spontaneous.  Responds to sternal rub.  Barely arousable.  Weakly follows commands.  EKG: independently reviewed, showing sinus rhythm 77, QTc 436  Chest x-ray on Admission: I personally reviewed and I agree with radiologist reading as below.  DG Chest Port 1 View  Result Date: 08/13/2021 CLINICAL DATA:  Weakness. EXAM:  PORTABLE CHEST 1 VIEW COMPARISON:  Chest x-ray dated August 01, 2021. FINDINGS: The heart size and mediastinal contours are within normal limits. Pulmonary vascular congestion. Continued low lung volumes with bibasilar atelectasis/scarring. No focal consolidation, pleural effusion, or pneumothorax. No acute osseous abnormality. IMPRESSION: 1. Pulmonary vascular congestion. Electronically Signed   By: Titus Dubin M.D.   On: 08/13/2021 13:45    Labs on Admission: I have personally reviewed following labs  CBC: Recent Labs  Lab 08/13/21 1332  WBC 23.6*  HGB 13.9  HCT 42.0  MCV 94.4  PLT 021   Basic Metabolic Panel: Recent Labs  Lab 08/13/21 1332  NA 138  K 4.0  CL 104  CO2 27  GLUCOSE 147*  BUN 19  CREATININE 1.04*  CALCIUM 7.0*   GFR: Estimated Creatinine Clearance: 41.4 mL/min (A) (by C-G formula based on SCr of 1.04 mg/dL (H)).  Liver Function Tests: Recent Labs  Lab 08/13/21 1332  AST 32  ALT 16  ALKPHOS 59  BILITOT 1.6*  PROT 5.1*  ALBUMIN 2.6*   Urine analysis:    Component Value Date/Time   COLORURINE YELLOW 06/08/2021 1600   APPEARANCEUR CLEAR 06/08/2021 1600   APPEARANCEUR Cloudy (A) 02/27/2019 1055   LABSPEC 1.015 06/08/2021 1600   PHURINE 7.0 06/08/2021 1600   GLUCOSEU NEGATIVE 06/08/2021 1600   GLUCOSEU NEGATIVE 12/24/2017 0931   HGBUR NEGATIVE 06/08/2021 1600   BILIRUBINUR NEGATIVE 06/08/2021 1600   BILIRUBINUR Negative 02/27/2019 1055   KETONESUR NEGATIVE 06/08/2021 1600   PROTEINUR NEGATIVE 06/08/2021 1600   UROBILINOGEN 0.2 12/26/2018 1523   UROBILINOGEN 0.2 12/24/2017 0931   NITRITE NEGATIVE 06/08/2021 1600   LEUKOCYTESUR NEGATIVE 06/08/2021 1600   Dr. Tobie Poet [facility]  If 7PM-7AM, please contact overnight-coverage provider If 7AM-7PM, please contact day coverage provider [email or web address]  08/13/2021, 2:52 PM

## 2021-08-13 NOTE — Progress Notes (Signed)
Cpap applied via full facemask. Will continue to monitor

## 2021-08-13 NOTE — Progress Notes (Signed)
Patient with mild wob. BBS noted rhonci throughout. SVN given. Able to get patient to use flutter for secretions clearance. Patient used x 2 and was able to clear secretions without difficulty. Some sob remains but patient reports feels better. O2 increased to 6L

## 2021-08-14 ENCOUNTER — Inpatient Hospital Stay: Payer: Medicare PPO

## 2021-08-14 DIAGNOSIS — M1990 Unspecified osteoarthritis, unspecified site: Secondary | ICD-10-CM | POA: Diagnosis present

## 2021-08-14 DIAGNOSIS — I5033 Acute on chronic diastolic (congestive) heart failure: Secondary | ICD-10-CM | POA: Diagnosis present

## 2021-08-14 DIAGNOSIS — J9621 Acute and chronic respiratory failure with hypoxia: Secondary | ICD-10-CM

## 2021-08-14 DIAGNOSIS — G500 Trigeminal neuralgia: Secondary | ICD-10-CM | POA: Diagnosis present

## 2021-08-14 DIAGNOSIS — E1140 Type 2 diabetes mellitus with diabetic neuropathy, unspecified: Secondary | ICD-10-CM | POA: Diagnosis not present

## 2021-08-14 DIAGNOSIS — G4700 Insomnia, unspecified: Secondary | ICD-10-CM | POA: Diagnosis present

## 2021-08-14 DIAGNOSIS — D72829 Elevated white blood cell count, unspecified: Secondary | ICD-10-CM | POA: Diagnosis present

## 2021-08-14 DIAGNOSIS — I2721 Secondary pulmonary arterial hypertension: Secondary | ICD-10-CM | POA: Diagnosis present

## 2021-08-14 DIAGNOSIS — K219 Gastro-esophageal reflux disease without esophagitis: Secondary | ICD-10-CM | POA: Diagnosis present

## 2021-08-14 DIAGNOSIS — G4733 Obstructive sleep apnea (adult) (pediatric): Secondary | ICD-10-CM | POA: Diagnosis present

## 2021-08-14 DIAGNOSIS — Z20822 Contact with and (suspected) exposure to covid-19: Secondary | ICD-10-CM | POA: Diagnosis present

## 2021-08-14 DIAGNOSIS — Z515 Encounter for palliative care: Secondary | ICD-10-CM | POA: Diagnosis not present

## 2021-08-14 DIAGNOSIS — N1831 Chronic kidney disease, stage 3a: Secondary | ICD-10-CM

## 2021-08-14 DIAGNOSIS — R0602 Shortness of breath: Secondary | ICD-10-CM | POA: Diagnosis not present

## 2021-08-14 DIAGNOSIS — F32A Depression, unspecified: Secondary | ICD-10-CM | POA: Diagnosis present

## 2021-08-14 DIAGNOSIS — E669 Obesity, unspecified: Secondary | ICD-10-CM | POA: Diagnosis present

## 2021-08-14 DIAGNOSIS — J690 Pneumonitis due to inhalation of food and vomit: Secondary | ICD-10-CM | POA: Diagnosis present

## 2021-08-14 DIAGNOSIS — Z6832 Body mass index (BMI) 32.0-32.9, adult: Secondary | ICD-10-CM | POA: Diagnosis not present

## 2021-08-14 DIAGNOSIS — F419 Anxiety disorder, unspecified: Secondary | ICD-10-CM | POA: Diagnosis present

## 2021-08-14 DIAGNOSIS — E039 Hypothyroidism, unspecified: Secondary | ICD-10-CM | POA: Diagnosis present

## 2021-08-14 DIAGNOSIS — H9193 Unspecified hearing loss, bilateral: Secondary | ICD-10-CM | POA: Diagnosis present

## 2021-08-14 DIAGNOSIS — J9620 Acute and chronic respiratory failure, unspecified whether with hypoxia or hypercapnia: Secondary | ICD-10-CM | POA: Diagnosis present

## 2021-08-14 DIAGNOSIS — E785 Hyperlipidemia, unspecified: Secondary | ICD-10-CM | POA: Diagnosis present

## 2021-08-14 DIAGNOSIS — J9611 Chronic respiratory failure with hypoxia: Secondary | ICD-10-CM | POA: Diagnosis present

## 2021-08-14 DIAGNOSIS — Z66 Do not resuscitate: Secondary | ICD-10-CM | POA: Diagnosis present

## 2021-08-14 DIAGNOSIS — I2510 Atherosclerotic heart disease of native coronary artery without angina pectoris: Secondary | ICD-10-CM | POA: Diagnosis present

## 2021-08-14 DIAGNOSIS — R0902 Hypoxemia: Secondary | ICD-10-CM | POA: Diagnosis present

## 2021-08-14 DIAGNOSIS — I130 Hypertensive heart and chronic kidney disease with heart failure and stage 1 through stage 4 chronic kidney disease, or unspecified chronic kidney disease: Secondary | ICD-10-CM | POA: Diagnosis present

## 2021-08-14 DIAGNOSIS — J441 Chronic obstructive pulmonary disease with (acute) exacerbation: Secondary | ICD-10-CM | POA: Diagnosis present

## 2021-08-14 DIAGNOSIS — Z7189 Other specified counseling: Secondary | ICD-10-CM | POA: Diagnosis not present

## 2021-08-14 LAB — BASIC METABOLIC PANEL
Anion gap: 9 (ref 5–15)
BUN: 23 mg/dL (ref 8–23)
CO2: 30 mmol/L (ref 22–32)
Calcium: 8.2 mg/dL — ABNORMAL LOW (ref 8.9–10.3)
Chloride: 101 mmol/L (ref 98–111)
Creatinine, Ser: 1.26 mg/dL — ABNORMAL HIGH (ref 0.44–1.00)
GFR, Estimated: 40 mL/min — ABNORMAL LOW (ref >60)
Glucose, Bld: 129 mg/dL — ABNORMAL HIGH (ref 70–99)
Potassium: 3.9 mmol/L (ref 3.5–5.1)
Sodium: 140 mmol/L (ref 135–145)

## 2021-08-14 LAB — VITAMIN D 25 HYDROXY (VIT D DEFICIENCY, FRACTURES): Vit D, 25-Hydroxy: 46.88 ng/mL (ref 30–100)

## 2021-08-14 LAB — CBC
HCT: 40.7 % (ref 36.0–46.0)
Hemoglobin: 13.5 g/dL (ref 12.0–15.0)
MCH: 31.3 pg (ref 26.0–34.0)
MCHC: 33.2 g/dL (ref 30.0–36.0)
MCV: 94.2 fL (ref 80.0–100.0)
Platelets: 192 10*3/uL (ref 150–400)
RBC: 4.32 MIL/uL (ref 3.87–5.11)
RDW: 14.9 % (ref 11.5–15.5)
WBC: 24 10*3/uL — ABNORMAL HIGH (ref 4.0–10.5)
nRBC: 0 % (ref 0.0–0.2)

## 2021-08-14 LAB — MAGNESIUM: Magnesium: 2.1 mg/dL (ref 1.7–2.4)

## 2021-08-14 LAB — PHOSPHORUS: Phosphorus: 4.5 mg/dL (ref 2.5–4.6)

## 2021-08-14 LAB — TSH: TSH: 0.459 u[IU]/mL (ref 0.350–4.500)

## 2021-08-14 LAB — VITAMIN B12: Vitamin B-12: 404 pg/mL (ref 180–914)

## 2021-08-14 MED ORDER — METHYLPREDNISOLONE SODIUM SUCC 125 MG IJ SOLR
60.0000 mg | Freq: Four times a day (QID) | INTRAMUSCULAR | Status: DC
  Administered 2021-08-14 – 2021-08-15 (×4): 60 mg via INTRAVENOUS
  Filled 2021-08-14 (×4): qty 2

## 2021-08-14 MED ORDER — ALBUTEROL SULFATE (2.5 MG/3ML) 0.083% IN NEBU: 2.5000 mg | INHALATION_SOLUTION | RESPIRATORY_TRACT | Status: DC | PRN

## 2021-08-14 MED ORDER — IPRATROPIUM-ALBUTEROL 0.5-2.5 (3) MG/3ML IN SOLN
3.0000 mL | RESPIRATORY_TRACT | Status: DC
  Administered 2021-08-14 – 2021-08-16 (×9): 3 mL via RESPIRATORY_TRACT
  Filled 2021-08-14 (×9): qty 3

## 2021-08-14 NOTE — Progress Notes (Signed)
Patient is deaf. Treatment team needs to use write on the dry erase board to ask questions. Patient can respond and follow directions

## 2021-08-14 NOTE — Progress Notes (Addendum)
Patient ID: Jessica Erickson, female   DOB: 02-Jan-1926, 87 y.o.   MRN: [id number]  PROGRESS NOTE    Jessica Erickson  YTK:[id number] DOB: 1926/09/11 DOA: 08/13/2021 PCP: McLean-Scocuzza, Nino Glow, MD   Brief Narrative:  87 y.o. female with medical history significant for severe bilateral hearing loss, COPD, history of CVA with no residual deficits, hypothyroid, history of DVT on Eliquis, chronic bibasilar pulmonary scarring, CKD 3A, chronic respiratory failure requiring 2 L nasal cannula oxygen supplementation presented with not feeling well.  On presentation, she was on 3.5 L oxygen by nasal cannula.  WBC of 23.6; troponin was 17; lactic acid was within normal limits.  Chest x-ray in the ED showed pulmonary vascular congestion.  COVID 19 and influenza testing were negative.  She was given IV Lasix in the ED.  Assessment & Plan:   Acute on chronic hypoxic respiratory failure COPD exacerbation Pulmonary edema/acute on chronic diastolic heart failure -Patient normally uses 2 L oxygen via nasal cannula; presented requiring 3.5 L oxygen by nasal cannula.  Subsequently oxygen requirement has worsened and is currently requiring 5 to 6 L oxygen. -Chest x-ray on presentation showed pulmonary congestion.  Patient was given 1 dose of IV Lasix and diuresed well. -Still looks volume overloaded.  Blood pressure on the lower side hence unable to give IV Lasix. -Continue nebs.  We will start Solu-Medrol.  Leukocytosis -Possibly reactive.  Monitor.  Patient was recently treated treated with IV and oral steroids for COPD exacerbation -No evidence of infection.  Will monitor off antibiotics  Trigeminal neuralgia -Continue gabapentin.  Used to be on Tegretol but this has been already discontinued by her neurologist due to medication incompatibility with her current blood thinners  OSA Continue CPAP nightly  Severe bilateral hearing loss  Hypothyroidism -Continue levothyroxine  History of DVT -Continue  Eliquis  Hyperlipidemia -Continue statin  Anxiety/depression -Continue mirtazapine  Chronic kidney disease stage IIIa -Creatinine stable.  Monitor  History of CVA -Currently stable  Generalized deconditioning -Overall prognosis is guarded to poor.  If condition does not improve in the next 24 to 48 hours, recommend comfort measures/hospice   DVT prophylaxis: Eliquis Code Status: DNR/DNI Family Communication: Spoke to son/[name] on phone on 08/14/2021 Disposition Plan: Status is: Inpatient  Remains inpatient appropriate because:Inpatient level of care appropriate due to severity of illness  Dispo: The patient is from: SNF              Anticipated d/c is to: SNF              Patient currently is not medically stable to d/c.   Difficult to place patient No   Consultants: Consult palliative care  Procedures: None  Antimicrobials: None   Subjective: Patient seen and examined at bedside.  Very hard of hearing.  Nursing staff reports that patient is more responsive this morning.  No overnight fever or vomiting reported.  Objective: Vitals:   08/14/21 0020 08/14/21 0427 08/14/21 0722 08/14/21 0741  BP: (!) 105/49 (!) 107/46  (!) 105/47  Pulse: 67 67  65  Resp: (!) 22 20  20   Temp: 98.6 F (37 C) 98.7 F (37.1 C)  98.1 F (36.7 C)  TempSrc: Axillary Axillary    SpO2: 92% 97% 93% 93%  Weight:      Height:        Intake/Output Summary (Last 24 hours) at 08/14/2021 1032 Last data filed at 08/14/2021 0429 Gross per 24 hour  Intake 80 ml  Output 1100 ml  Net -1020 ml   Filed Weights   08/13/21 1811  Weight: 94 kg    Examination:  General exam: Elderly female lying in bed.  Currently on 5 to 6 L oxygen by nasal cannula.  Very hard of hearing. Respiratory system: Bilateral decreased breath sounds at bases with scattered crackles Cardiovascular system: S1 & S2 heard, Rate controlled Gastrointestinal system: Abdomen is nondistended, soft and nontender. Normal bowel  sounds heard. Extremities: No cyanosis, clubbing; bilateral lower extremity 1-2+ pitting edema present  central nervous system: Wakes up slightly, hardly participates in any conversation.  Extremely slow to respond.  No focal neurological deficits. Moving extremities Skin: No rashes, lesions or ulcers Psychiatry: Could not be assessed because of mental status   Data Reviewed: I have personally reviewed following labs and imaging studies  CBC: Recent Labs  Lab 08/13/21 1332 08/14/21 0439  WBC 23.6* 24.0*  HGB 13.9 13.5  HCT 42.0 40.7  MCV 94.4 94.2  PLT 192 932   Basic Metabolic Panel: Recent Labs  Lab 08/13/21 1332 08/14/21 0439  NA 138 140  K 4.0 3.9  CL 104 101  CO2 27 30  GLUCOSE 147* 129*  BUN 19 23  CREATININE 1.04* 1.26*  CALCIUM 7.0* 8.2*  MG  --  2.1  PHOS  --  4.5   GFR: Estimated Creatinine Clearance: 32.9 mL/min (A) (by C-G formula based on SCr of 1.26 mg/dL (H)). Liver Function Tests: Recent Labs  Lab 08/13/21 1332  AST 32  ALT 16  ALKPHOS 59  BILITOT 1.6*  PROT 5.1*  ALBUMIN 2.6*   No results for input(s): LIPASE, AMYLASE in the last 168 hours. No results for input(s): AMMONIA in the last 168 hours. Coagulation Profile: No results for input(s): INR, PROTIME in the last 168 hours. Cardiac Enzymes: No results for input(s): CKTOTAL, CKMB, CKMBINDEX, TROPONINI in the last 168 hours. BNP (last 3 results) No results for input(s): PROBNP in the last 8760 hours. HbA1C: No results for input(s): HGBA1C in the last 72 hours. CBG: No results for input(s): GLUCAP in the last 168 hours. Lipid Profile: No results for input(s): CHOL, HDL, LDLCALC, TRIG, CHOLHDL, LDLDIRECT in the last 72 hours. Thyroid Function Tests: Recent Labs    08/14/21 0439  TSH 0.459   Anemia Panel: No results for input(s): VITAMINB12, FOLATE, FERRITIN, TIBC, IRON, RETICCTPCT in the last 72 hours. Sepsis Labs: Recent Labs  Lab 08/13/21 1332 08/13/21 1654  PROCALCITON 0.13   --   LATICACIDVEN 1.5 1.0    Recent Results (from the past 240 hour(s))  Resp Panel by RT-PCR (Flu A&B, Covid) Nasopharyngeal Swab     Status: None   Collection Time: 08/04/21 10:50 PM   Specimen: Nasopharyngeal Swab; Nasopharyngeal(NP) swabs in vial transport medium  Result Value Ref Range Status   SARS Coronavirus 2 by RT PCR NEGATIVE NEGATIVE Final    Comment: (NOTE) SARS-CoV-2 target nucleic acids are NOT DETECTED.  The SARS-CoV-2 RNA is generally detectable in upper respiratory specimens during the acute phase of infection. The lowest concentration of SARS-CoV-2 viral copies this assay can detect is 138 copies/mL. A negative result does not preclude SARS-Cov-2 infection and should not be used as the sole basis for treatment or other patient management decisions. A negative result may occur with  improper specimen collection/handling, submission of specimen other than nasopharyngeal swab, presence of viral mutation(s) within the areas targeted by this assay, and inadequate number of viral copies(<138 copies/mL). A negative result must be combined with clinical observations,  patient history, and epidemiological information. The expected result is Negative.  Fact Sheet for Patients:  EntrepreneurPulse.com.au  Fact Sheet for Healthcare Providers:  IncredibleEmployment.be  This test is no t yet approved or cleared by the [place] FDA and  has been authorized for detection and/or diagnosis of SARS-CoV-2 by FDA under an Emergency Use Authorization (EUA). This EUA will remain  in effect (meaning this test can be used) for the duration of the COVID-19 declaration under Section 564(b)(1) of the Act, 21 U.S.C.section 360bbb-3(b)(1), unless the authorization is terminated  or revoked sooner.       Influenza A by PCR NEGATIVE NEGATIVE Final   Influenza B by PCR NEGATIVE NEGATIVE Final    Comment: (NOTE) The Xpert Xpress SARS-CoV-2/FLU/RSV  plus assay is intended as an aid in the diagnosis of influenza from Nasopharyngeal swab specimens and should not be used as a sole basis for treatment. Nasal washings and aspirates are unacceptable for Xpert Xpress SARS-CoV-2/FLU/RSV testing.  Fact Sheet for Patients: EntrepreneurPulse.com.au  Fact Sheet for Healthcare Providers: IncredibleEmployment.be  This test is not yet approved or cleared by the [place] FDA and has been authorized for detection and/or diagnosis of SARS-CoV-2 by FDA under an Emergency Use Authorization (EUA). This EUA will remain in effect (meaning this test can be used) for the duration of the COVID-19 declaration under Section 564(b)(1) of the Act, 21 U.S.C. section 360bbb-3(b)(1), unless the authorization is terminated or revoked.  Performed at [facility], [address] [place], [place] [address]   Blood culture (single)     Status: None (Preliminary result)   Collection Time: 08/13/21  1:32 PM   Specimen: BLOOD  Result Value Ref Range Status   Specimen Description BLOOD RIGHT ANTECUBITAL  Final   Special Requests   Final    BOTTLES DRAWN AEROBIC AND ANAEROBIC Blood Culture results may not be optimal due to an inadequate volume of blood received in culture bottles   Culture   Final    NO GROWTH < 24 HOURS Performed at [facility], [address] [place], [place] [address]    Report Status PENDING  Incomplete  Resp Panel by RT-PCR (Flu A&B, Covid) Nasopharyngeal Swab     Status: None   Collection Time: 08/13/21  2:06 PM   Specimen: Nasopharyngeal Swab; Nasopharyngeal(NP) swabs in vial transport medium  Result Value Ref Range Status   SARS Coronavirus 2 by RT PCR NEGATIVE NEGATIVE Final    Comment: (NOTE) SARS-CoV-2 target nucleic acids are NOT DETECTED.  The SARS-CoV-2 RNA is generally detectable in upper respiratory specimens during the acute phase of infection. The  lowest concentration of SARS-CoV-2 viral copies this assay can detect is 138 copies/mL. A negative result does not preclude SARS-Cov-2 infection and should not be used as the sole basis for treatment or other patient management decisions. A negative result may occur with  improper specimen collection/handling, submission of specimen other than nasopharyngeal swab, presence of viral mutation(s) within the areas targeted by this assay, and inadequate number of viral copies(<138 copies/mL). A negative result must be combined with clinical observations, patient history, and epidemiological information. The expected result is Negative.  Fact Sheet for Patients:  EntrepreneurPulse.com.au  Fact Sheet for Healthcare Providers:  IncredibleEmployment.be  This test is no t yet approved or cleared by the [place] FDA and  has been authorized for detection and/or diagnosis of SARS-CoV-2 by FDA under an Emergency Use Authorization (EUA). This EUA will remain  in effect (meaning this  test can be used) for the duration of the COVID-19 declaration under Section 564(b)(1) of the Act, 21 U.S.C.section 360bbb-3(b)(1), unless the authorization is terminated  or revoked sooner.       Influenza A by PCR NEGATIVE NEGATIVE Final   Influenza B by PCR NEGATIVE NEGATIVE Final    Comment: (NOTE) The Xpert Xpress SARS-CoV-2/FLU/RSV plus assay is intended as an aid in the diagnosis of influenza from Nasopharyngeal swab specimens and should not be used as a sole basis for treatment. Nasal washings and aspirates are unacceptable for Xpert Xpress SARS-CoV-2/FLU/RSV testing.  Fact Sheet for Patients: EntrepreneurPulse.com.au  Fact Sheet for Healthcare Providers: IncredibleEmployment.be  This test is not yet approved or cleared by the [place] FDA and has been authorized for detection and/or diagnosis of SARS-CoV-2 by FDA under  an Emergency Use Authorization (EUA). This EUA will remain in effect (meaning this test can be used) for the duration of the COVID-19 declaration under Section 564(b)(1) of the Act, 21 U.S.C. section 360bbb-3(b)(1), unless the authorization is terminated or revoked.  Performed at [facility], [address], [place], [place] [address]          Radiology Studies: [facility] Chest [place] 1 View  Result Date: 08/13/2021 CLINICAL DATA:  Weakness. EXAM: PORTABLE CHEST 1 VIEW COMPARISON:  Chest x-ray dated August 01, 2021. FINDINGS: The heart size and mediastinal contours are within normal limits. Pulmonary vascular congestion. Continued low lung volumes with bibasilar atelectasis/scarring. No focal consolidation, pleural effusion, or pneumothorax. No acute osseous abnormality. IMPRESSION: 1. Pulmonary vascular congestion. Electronically Signed   By: Titus Dubin M.D.   On: 08/13/2021 13:45        Scheduled Meds:  albuterol  2.5 mg Nebulization TID   apixaban  2.5 mg Oral BID   atorvastatin  40 mg Oral q1800   fluticasone furoate-vilanterol  1 puff Inhalation Daily   And   umeclidinium bromide  1 puff Inhalation Daily   gabapentin  400 mg Oral TID   ipratropium-albuterol  3 mL Nebulization Once   [START ON 08/15/2021] levothyroxine  200 mcg Oral Q0600   loratadine  10 mg Oral Daily   melatonin  10 mg Oral QHS   mirtazapine  15 mg Oral QHS   multivitamin with minerals  1 tablet Oral Daily   pantoprazole  40 mg Oral Daily   polyethylene glycol  17 g Oral Daily   Continuous Infusions:        Aline August, MD [facility] 08/14/2021, 10:32 AM

## 2021-08-15 ENCOUNTER — Encounter: Payer: Self-pay | Admitting: Internal Medicine

## 2021-08-15 DIAGNOSIS — Z7189 Other specified counseling: Secondary | ICD-10-CM

## 2021-08-15 DIAGNOSIS — H9193 Unspecified hearing loss, bilateral: Secondary | ICD-10-CM

## 2021-08-15 DIAGNOSIS — Z515 Encounter for palliative care: Secondary | ICD-10-CM

## 2021-08-15 LAB — COMPREHENSIVE METABOLIC PANEL
ALT: 16 U/L (ref 0–44)
AST: 13 U/L — ABNORMAL LOW (ref 15–41)
Albumin: 2.7 g/dL — ABNORMAL LOW (ref 3.5–5.0)
Alkaline Phosphatase: 70 U/L (ref 38–126)
Anion gap: 8 (ref 5–15)
BUN: 31 mg/dL — ABNORMAL HIGH (ref 8–23)
CO2: 31 mmol/L (ref 22–32)
Calcium: 8.1 mg/dL — ABNORMAL LOW (ref 8.9–10.3)
Chloride: 99 mmol/L (ref 98–111)
Creatinine, Ser: 1.46 mg/dL — ABNORMAL HIGH (ref 0.44–1.00)
GFR, Estimated: 33 mL/min — ABNORMAL LOW (ref >60)
Glucose, Bld: 246 mg/dL — ABNORMAL HIGH (ref 70–99)
Potassium: 4 mmol/L (ref 3.5–5.1)
Sodium: 138 mmol/L (ref 135–145)
Total Bilirubin: 0.9 mg/dL (ref 0.3–1.2)
Total Protein: 6 g/dL — ABNORMAL LOW (ref 6.5–8.1)

## 2021-08-15 LAB — CBC WITH DIFFERENTIAL/PLATELET
Abs Immature Granulocytes: 0.18 10*3/uL — ABNORMAL HIGH (ref 0.00–0.07)
Basophils Absolute: 0 10*3/uL (ref 0.0–0.1)
Basophils Relative: 0 %
Eosinophils Absolute: 0 10*3/uL (ref 0.0–0.5)
Eosinophils Relative: 0 %
HCT: 38.5 % (ref 36.0–46.0)
Hemoglobin: 13 g/dL (ref 12.0–15.0)
Immature Granulocytes: 1 %
Lymphocytes Relative: 4 %
Lymphs Abs: 0.6 10*3/uL — ABNORMAL LOW (ref 0.7–4.0)
MCH: 31.7 pg (ref 26.0–34.0)
MCHC: 33.8 g/dL (ref 30.0–36.0)
MCV: 93.9 fL (ref 80.0–100.0)
Monocytes Absolute: 0.1 10*3/uL (ref 0.1–1.0)
Monocytes Relative: 1 %
Neutro Abs: 16.1 10*3/uL — ABNORMAL HIGH (ref 1.7–7.7)
Neutrophils Relative %: 94 %
Platelets: 159 10*3/uL (ref 150–400)
RBC: 4.1 MIL/uL (ref 3.87–5.11)
RDW: 14.4 % (ref 11.5–15.5)
WBC: 17 10*3/uL — ABNORMAL HIGH (ref 4.0–10.5)
nRBC: 0 % (ref 0.0–0.2)

## 2021-08-15 LAB — MAGNESIUM: Magnesium: 2.2 mg/dL (ref 1.7–2.4)

## 2021-08-15 LAB — GLUCOSE, CAPILLARY: Glucose-Capillary: 195 mg/dL — ABNORMAL HIGH (ref 70–99)

## 2021-08-15 MED ORDER — FUROSEMIDE 10 MG/ML IJ SOLN
40.0000 mg | Freq: Every day | INTRAMUSCULAR | Status: DC
  Administered 2021-08-15 – 2021-08-16 (×2): 40 mg via INTRAVENOUS
  Filled 2021-08-15 (×2): qty 4

## 2021-08-15 MED ORDER — SODIUM CHLORIDE 0.9 % IV SOLN
2.0000 g | INTRAVENOUS | Status: DC
  Administered 2021-08-15 – 2021-08-19 (×5): 2 g via INTRAVENOUS
  Filled 2021-08-15 (×2): qty 2
  Filled 2021-08-15 (×2): qty 20
  Filled 2021-08-15: qty 2
  Filled 2021-08-15 (×2): qty 20

## 2021-08-15 MED ORDER — METHYLPREDNISOLONE SODIUM SUCC 40 MG IJ SOLR
40.0000 mg | Freq: Four times a day (QID) | INTRAMUSCULAR | Status: DC
  Administered 2021-08-15 – 2021-08-17 (×9): 40 mg via INTRAVENOUS
  Filled 2021-08-15 (×9): qty 1

## 2021-08-15 MED ORDER — GABAPENTIN 100 MG PO CAPS
200.0000 mg | ORAL_CAPSULE | Freq: Three times a day (TID) | ORAL | Status: DC
  Administered 2021-08-15 – 2021-08-19 (×13): 200 mg via ORAL
  Filled 2021-08-15 (×14): qty 2

## 2021-08-15 MED ORDER — METRONIDAZOLE 500 MG/100ML IV SOLN
500.0000 mg | Freq: Two times a day (BID) | INTRAVENOUS | Status: DC
  Administered 2021-08-15 – 2021-08-19 (×9): 500 mg via INTRAVENOUS
  Filled 2021-08-15 (×12): qty 100

## 2021-08-15 NOTE — Progress Notes (Signed)
Nutrition Brief Note  Patient identified on the Malnutrition Screening Tool (MST) Report  Wt Readings from Last 15 Encounters:  08/13/21 94 kg  08/02/21 101.6 kg  07/14/21 92.4 kg  06/28/21 93 kg  06/08/21 90.7 kg  05/09/21 90.7 kg  05/05/21 91.1 kg  05/03/21 90.5 kg  04/26/21 90.3 kg  03/29/21 90.3 kg  11/11/20 89.3 kg  09/29/20 88.5 kg  08/12/20 88.5 kg  07/09/20 89.5 kg  07/06/20 88.6 kg   Pt resting in bed at the time of visit, family at bedside. Pt deaf, family assists with hx. Reports that pt has a great appetite and weight has been stable for months. No concerns voiced from family other than pt's diet order being overly restrictive for age. Adjusted to carb modified as pt's glucose has been elevated. Fluid restriction in place, left in place.  Body mass index is 32.46 kg/m. Patient meets criteria for obesity class I based on current BMI.   Current diet order is carb modified, patient is consuming approximately 100% of meals at this time. Labs and medications reviewed.   No nutrition interventions warranted at this time. If nutrition issues arise, please consult RD.   Ranell Patrick, RD, LDN Clinical Dietitian Pager on [place]

## 2021-08-15 NOTE — NC FL2 (Signed)
[place] LEVEL OF CARE SCREENING TOOL     IDENTIFICATION  Patient Name: Jessica Erickson Birthdate: 1926-01-20 Sex: female Admission Date (Current Location): 08/13/2021  [facility] and [place] Number:  Engineering geologist and Address:  [facility], [address], [place], [place] [address]      Provider Number: [id number]  Attending Physician Name and Address:  Aline August, MD  Relative Name and Phone Number:  Carmie Lanpher (son) [phone]    Current Level of Care: Hospital Recommended Level of Care: [place] Prior Approval Number:    Date Approved/Denied:   PASRR Number: [id number]  Discharge Plan: SNF    Current Diagnoses: Patient Active Problem List   Diagnosis Date Noted   Acute on chronic respiratory failure ([place]) 08/14/2021   Shortness of breath 08/13/2021   CKD (chronic kidney disease), stage III ([place]) 08/13/2021   History of CVA (cerebrovascular accident) 08/13/2021   Prediabetes 06/29/2021   Pulmonary artery hypertension ([place]) 05/03/2021   Stage 4 chronic kidney disease ([place]) 03/29/2021   Grade I diastolic dysfunction 77/82/4235   Obesity (BMI 30-39.9) 08/16/2020   Chronic kidney disease, stage 3b ([place]) 04/06/2020   Thrombocytopenia ([place]) 04/06/2020   Seborrheic keratoses 04/06/2020   Hip pain 09/30/2019   History of deep vein thrombosis (DVT) of lower extremity 09/30/2019   Arthritis 09/30/2019   Fall 03/11/2019   Epistaxis 12/11/2018   History of DVT (deep vein thrombosis) 11/12/2018   Bilateral leg edema 06/04/2018   Hearing loss of both ears 06/04/2018   Actinic keratosis 06/04/2018   Bronchiectasis ([place]) 06/04/2018   Depression 05/17/2018   Dehydration 05/15/2018   Hypertension 05/03/2018   Lymphedema 05/02/2018   Iron deficiency anemia 05/02/2018   Physical deconditioning 05/02/2018   Drug interaction 05/02/2018   Supratherapeutic INR 05/02/2018   Presence of IVC filter 05/02/2018    Anemia 04/11/2018   Fatigue 04/11/2018   Moderate protein-calorie malnutrition ([place]) 03/22/2018   Orthostasis 03/05/2018   Dizziness 03/04/2018   COPD (chronic obstructive pulmonary disease) ([place]) 12/13/2017   Anxiety and depression 12/13/2017   Trigeminal neuralgia 12/13/2017   GERD (gastroesophageal reflux disease) 12/13/2017   Type 2 diabetes mellitus with stage 3b chronic kidney disease, without long-term current use of insulin ([place]) 12/13/2017   Constipation 12/13/2017   Insomnia 12/13/2017   Hypothyroid 12/13/2017   UTI (urinary tract infection) 10/08/2015   Low back pain 10/08/2015   Collapsed vertebra, not elsewhere classified, thoracic region, initial encounter for fracture ([place]) 09/20/2015   Basal cell carcinoma of scalp 04/06/2014   Fothergill's neuralgia 08/05/2013   Bladder infection, chronic 02/11/2013   Female genuine stress incontinence 02/11/2013   Incomplete bladder emptying 02/11/2013   Intrinsic sphincter deficiency 02/11/2013   Mixed incontinence 02/11/2013   Excessive urination at night 02/11/2013   Bladder retention 02/11/2013   FOM (frequency of micturition) 02/11/2013   Basal cell carcinoma of face 09/13/2011    Orientation RESPIRATION BLADDER Height & Weight     Self, Time, Situation, Place  O2 ( 2L) Continent Weight: 94 kg Height:  5\' 7"  (170.2 cm)  BEHAVIORAL SYMPTOMS/MOOD NEUROLOGICAL BOWEL NUTRITION STATUS      Continent Diet (see discharge summary)  AMBULATORY STATUS COMMUNICATION OF NEEDS Skin   Limited Assist Verbally Normal                       Personal Care Assistance Level of Assistance  Bathing, Feeding, Dressing Bathing Assistance: Limited assistance Feeding assistance: Limited  assistance Dressing Assistance: Limited assistance     Functional Limitations Info  Sight, Hearing, Speech Sight Info: Impaired Hearing Info: Impaired (deaf) Speech Info: Impaired    SPECIAL CARE FACTORS FREQUENCY  PT (By licensed PT), OT (By  licensed OT), Speech therapy     PT Frequency: 5 times per week OT Frequency: 5 times per week     Speech Therapy Frequency: 2 times per week      Contractures Contractures Info: Not present    Additional Factors Info  Code Status, Allergies Code Status Info: DNR Allergies Info: PCN, Sulfa, amitiza, ASA, PCN G           Current Medications (08/15/2021):  This is the current hospital active medication list Current Facility-Administered Medications  Medication Dose Route Frequency Provider Last Rate Last Admin   acetaminophen (TYLENOL) tablet 650 mg  650 mg Oral Q6H PRN Cox, Amy N, DO       Or   acetaminophen (TYLENOL) suppository 650 mg  650 mg Rectal Q6H PRN Cox, Amy N, DO       albuterol (PROVENTIL) (2.5 MG/3ML) 0.083% nebulizer solution 2.5 mg  2.5 mg Nebulization Q2H PRN Aline August, MD       apixaban (ELIQUIS) tablet 2.5 mg  2.5 mg Oral BID Cox, Amy N, DO   2.5 mg at 08/14/21 2122   atorvastatin (LIPITOR) tablet 40 mg  40 mg Oral q1800 Cox, Amy N, DO   40 mg at 08/14/21 1810   cefTRIAXone (ROCEPHIN) 2 g in sodium chloride 0.9 % 100 mL IVPB  2 g Intravenous Q24H Alekh, Kshitiz, MD       fluticasone furoate-vilanterol (BREO ELLIPTA) 100-25 MCG/INH 1 puff  1 puff Inhalation Daily Cox, Amy N, DO   1 puff at 08/15/21 1240   And   umeclidinium bromide (INCRUSE ELLIPTA) 62.5 MCG/INH 1 puff  1 puff Inhalation Daily Cox, Amy N, DO   1 puff at 08/15/21 1240   furosemide (LASIX) injection 40 mg  40 mg Intravenous Daily Alekh, Kshitiz, MD   40 mg at 08/15/21 1235   gabapentin (NEURONTIN) capsule 200 mg  200 mg Oral TID Aline August, MD   200 mg at 08/15/21 1212   guaiFENesin (MUCINEX) 12 hr tablet 1,200 mg  1,200 mg Oral BID PRN Cox, Amy N, DO       ipratropium-albuterol (DUONEB) 0.5-2.5 (3) MG/3ML nebulizer solution 3 mL  3 mL Nebulization Q4H Alekh, Kshitiz, MD   3 mL at 08/15/21 1109   levothyroxine (SYNTHROID) tablet 200 mcg  200 mcg Oral Q0600 Cox, Amy N, DO   200 mcg at  08/15/21 5397   loratadine (CLARITIN) tablet 10 mg  10 mg Oral Daily Cox, Amy N, DO   10 mg at 08/15/21 1212   melatonin tablet 10 mg  10 mg Oral QHS Cox, Amy N, DO   10 mg at 08/14/21 2122   methylPREDNISolone sodium succinate (SOLU-MEDROL) 40 mg/mL injection 40 mg  40 mg Intravenous Q6H Alekh, Kshitiz, MD       metroNIDAZOLE (FLAGYL) IVPB 500 mg  500 mg Intravenous Q12H Alekh, Kshitiz, MD       mirtazapine (REMERON) tablet 15 mg  15 mg Oral QHS Cox, Amy N, DO   15 mg at 08/14/21 2122   multivitamin with minerals tablet 1 tablet  1 tablet Oral Daily Cox, Amy N, DO   1 tablet at 08/15/21 1212   ondansetron (ZOFRAN) tablet 4 mg  4 mg Oral Q6H PRN Cox,  Amy N, DO       Or   ondansetron (ZOFRAN) injection 4 mg  4 mg Intravenous Q6H PRN Cox, Amy N, DO       pantoprazole (PROTONIX) EC tablet 40 mg  40 mg Oral Daily Cox, Amy N, DO   40 mg at 08/15/21 1212   polyethylene glycol (MIRALAX / GLYCOLAX) packet 17 g  17 g Oral Daily Cox, Amy N, DO         Discharge Medications: Please see discharge summary for a list of discharge medications.  Relevant Imaging Results:  Relevant Lab Results:   Additional Information SS# [id number]  Shelbie Hutching, RN

## 2021-08-15 NOTE — Progress Notes (Signed)
Tried to place patient on CPAP, but patient refused.  Patient remains on 5LPM oxygen via nasal cannula.

## 2021-08-15 NOTE — TOC Initial Note (Signed)
Transition of Care [facility]) - Initial/Assessment Note    Patient Details  Name: Jessica Erickson MRN: [id number] Date of Birth: Nov 17, 1926  Transition of [facility]) CM/SW Contact:    Shelbie Hutching, RN Phone Number: 08/15/2021, 12:51 PM  Clinical Narrative:                 Patient admitted to the hospital with acute on chronic respiratory failure, patient wears chronic O2 at 2L.  Patient previously reported to this RNCM that she does not like wearing her O2 because it irritates her facial neuralgia.   RNCM met with patient and with patient's sons at the bedside.  Patient discharged to [facility] on 9/2 from the hospital for short term rehab.  Family reports that patient was doing well and [place] was going to send her home until this happened.  Family is interested in patient returning to [facility] to continue rehab.  PT and OT do recommend SNF still.  SNF workup started and bed request sent to [facility].     Expected Discharge Plan: Skilled Nursing Facility Barriers to Discharge: Continued Medical Work up   Patient Goals and CMS Choice Patient states their goals for this hospitalization and ongoing recovery are:: Family agrees with patient going back for continued rehab at discharge CMS Medicare.gov Compare Post Acute Care list provided to:: Patient Represenative (must comment) Choice offered to / list presented to : Adult Children  Expected Discharge Plan and Services Expected Discharge Plan: [place]   Discharge Planning Services: CM Consult Post Acute Care Choice: [place] Living arrangements for the past 2 months: Single Family Home                 DME Arranged: N/A DME Agency: NA       HH Arranged: NA HH Agency: NA        Prior Living Arrangements/Services Living arrangements for the past 2 months: Single Family Home Lives with:: Adult Children Patient language and need for interpreter reviewed:: Yes Do you feel  safe going back to the place where you live?: Yes      Need for Family Participation in Patient Care: Yes (Comment) Care giver support system in place?: Yes (comment) (son and daughter in Sports coach) Current home services: DME, Homehealth aide (oxygen, Walker) Criminal Activity/Legal Involvement Pertinent to Current Situation/Hospitalization: No - Comment as needed  Activities of Daily Living Home Assistive Devices/Equipment: Oxygen ADL Screening (condition at time of admission) Patient's cognitive ability adequate to safely complete daily activities?: No Is the patient deaf or have difficulty hearing?: Yes Does the patient have difficulty seeing, even when wearing glasses/contacts?: Yes Does the patient have difficulty concentrating, remembering, or making decisions?: Yes Patient able to express need for assistance with ADLs?: No Does the patient have difficulty dressing or bathing?: Yes Independently performs ADLs?: No Communication: Needs assistance Is this a change from baseline?: Pre-admission baseline Dressing (OT): Needs assistance Is this a change from baseline?: Pre-admission baseline Grooming: Needs assistance Is this a change from baseline?: Pre-admission baseline Feeding: Needs assistance Is this a change from baseline?: Pre-admission baseline Bathing: Needs assistance Is this a change from baseline?: Pre-admission baseline Toileting: Needs assistance Is this a change from baseline?: Pre-admission baseline In/Out Bed: Needs assistance Is this a change from baseline?: Pre-admission baseline Walks in Home: Needs assistance Is this a change from baseline?: Pre-admission baseline Does the patient have difficulty walking or climbing stairs?: Yes Weakness of Legs: Both Weakness of Arms/Hands:  Both  Permission Sought/Granted Permission sought to share information with : Case Manager, Family Supports, Chartered certified accountant granted to share information with :  Yes, Verbal Permission Granted  Share Information with NAME: Sabriya Yono  Permission granted to share info w AGENCY: Dietitian granted to share info w Relationship: Son  Permission granted to share info w Contact Information: [phone]  Emotional Assessment Appearance:: Appears stated age Attitude/Demeanor/Rapport: Engaged Affect (typically observed): Accepting Orientation: : Oriented to Self, Oriented to Place, Oriented to  Time, Oriented to Situation Alcohol / Substance Use: Not Applicable Psych Involvement: No (comment)  Admission diagnosis:  Shortness of breath [R06.02] Acute on chronic respiratory failure with hypoxia (HCC) [J96.21] Acute on chronic respiratory failure (HCC) [J96.20] Patient Active Problem List   Diagnosis Date Noted   Acute on chronic respiratory failure ([place]) 08/14/2021   Shortness of breath 08/13/2021   CKD (chronic kidney disease), stage III ([place]) 08/13/2021   History of CVA (cerebrovascular accident) 08/13/2021   Prediabetes 06/29/2021   Pulmonary artery hypertension ([place]) 05/03/2021   Stage 4 chronic kidney disease ([place]) 03/29/2021   Grade I diastolic dysfunction 27/74/1287   Obesity (BMI 30-39.9) 08/16/2020   Chronic kidney disease, stage 3b ([place]) 04/06/2020   Thrombocytopenia ([place]) 04/06/2020   Seborrheic keratoses 04/06/2020   Hip pain 09/30/2019   History of deep vein thrombosis (DVT) of lower extremity 09/30/2019   Arthritis 09/30/2019   Fall 03/11/2019   Epistaxis 12/11/2018   History of DVT (deep vein thrombosis) 11/12/2018   Bilateral leg edema 06/04/2018   Hearing loss of both ears 06/04/2018   Actinic keratosis 06/04/2018   Bronchiectasis ([place]) 06/04/2018   Depression 05/17/2018   Dehydration 05/15/2018   Hypertension 05/03/2018   Lymphedema 05/02/2018   Iron deficiency anemia 05/02/2018   Physical deconditioning 05/02/2018   Drug interaction 05/02/2018   Supratherapeutic INR 05/02/2018   Presence of IVC filter  05/02/2018   Anemia 04/11/2018   Fatigue 04/11/2018   Moderate protein-calorie malnutrition ([place]) 03/22/2018   Orthostasis 03/05/2018   Dizziness 03/04/2018   COPD (chronic obstructive pulmonary disease) ([place]) 12/13/2017   Anxiety and depression 12/13/2017   Trigeminal neuralgia 12/13/2017   GERD (gastroesophageal reflux disease) 12/13/2017   Type 2 diabetes mellitus with stage 3b chronic kidney disease, without long-term current use of insulin ([place]) 12/13/2017   Constipation 12/13/2017   Insomnia 12/13/2017   Hypothyroid 12/13/2017   UTI (urinary tract infection) 10/08/2015   Low back pain 10/08/2015   Collapsed vertebra, not elsewhere classified, thoracic region, initial encounter for fracture ([place]) 09/20/2015   Basal cell carcinoma of scalp 04/06/2014   Fothergill's neuralgia 08/05/2013   Bladder infection, chronic 02/11/2013   Female genuine stress incontinence 02/11/2013   Incomplete bladder emptying 02/11/2013   Intrinsic sphincter deficiency 02/11/2013   Mixed incontinence 02/11/2013   Excessive urination at night 02/11/2013   Bladder retention 02/11/2013   FOM (frequency of micturition) 02/11/2013   Basal cell carcinoma of face 09/13/2011   PCP:  McLean-Scocuzza, Nino Glow, MD Pharmacy:   [place], [place] [address] [place] [address] Phone: [phone] Fax: [phone]     Social Determinants of Health (SDOH) Interventions    Readmission Risk Interventions Readmission Risk Prevention Plan 08/15/2021  Transportation Screening Complete  Medication Review (RN Care Manager) Complete  PCP or Specialist appointment within 3-5 days of discharge Complete  HRI or Home Care Consult Complete  SW Recovery Care/Counseling Consult Complete  Palliative  Care Screening Complete  Skilled Nursing Facility Complete  Some recent data might be hidden

## 2021-08-15 NOTE — Evaluation (Signed)
Physical Therapy Evaluation Patient Details Name: Jessica Erickson MRN: [id number] DOB: 11-25-1926 Today's Date: 08/15/2021  History of Present Illness  Pt is a 87 y.o. female with medical history significant for severe bilateral hearing loss, COPD, history of CVA with no residual deficits, hypothyroid, history of DVT on Eliquis, chronic bibasilar pulmonary scarring, CKD 3A, chronic respiratory failure requiring 2 L nasal cannula oxygen supplementation, presents to the emergency department for chief concerns of not feeling well.  MD assessment includes: Acute on chronic hypoxic respiratory failure, COPD exacerbation, Pulmonary edema/acute on chronic diastolic heart failure, possible aspiration pneumonia present on admission, leukocytosis, and generalized deconditioning.   Clinical Impression  Pt was pleasant and motivated to participate during the session. Pt presented with significant functional weakness and required extra time and effort with all tasks and was only able to take 2-3 small steps at the EOB.  Pt's SpO2 and HR were both WNL on supplemental O2 with no adverse symptoms reported by pt.  Pt will benefit from PT services in a SNF setting upon discharge to safely address deficits listed in patient problem list for decreased caregiver assistance and eventual return to PLOF.         Recommendations for follow up therapy are one component of a multi-disciplinary discharge planning process, led by the attending physician.  Recommendations may be updated based on patient status, additional functional criteria and insurance authorization.  Follow Up Recommendations SNF    Equipment Recommendations  Other (comment) (TBD at next venue of care)    Recommendations for Other Services       Precautions / Restrictions Precautions Precautions: Fall Restrictions Weight Bearing Restrictions: No      Mobility  Bed Mobility Overal bed mobility: Needs Assistance Bed Mobility: Supine to Sit      Supine to sit: Supervision     General bed mobility comments: Extra time and effort and use of bed rail    Transfers Overall transfer level: Needs assistance Equipment used: Rolling walker (2 wheeled) Transfers: Sit to/from [employer] Transfers Sit to Stand: Min guard;From elevated surface         General transfer comment: Extra time and effort to come to standing and difficulty coming to full upright standing position  Ambulation/Gait Ambulation/Gait assistance: Min guard Gait Distance (Feet): 1 Feet Assistive device: Rolling walker (2 wheeled) Gait Pattern/deviations: Step-to pattern;Decreased step length - right;Decreased step length - left;Trunk flexed Gait velocity: decreased   General Gait Details: Pt able to take 2-3 very small steps at the EOB  Stairs            Wheelchair Mobility    Modified Rankin (Stroke Patients Only)       Balance Overall balance assessment: Needs assistance Sitting-balance support: No upper extremity supported;Feet supported Sitting balance-Leahy Scale: Good     Standing balance support: Bilateral upper extremity supported;During functional activity Standing balance-Leahy Scale: Fair Standing balance comment: Mod lean on the RW for support                             Pertinent Vitals/Pain Pain Assessment: No/denies pain    Home Living Family/patient expects to be discharged to:: Private residence Living Arrangements: Children Available Help at Discharge: Family;Personal care attendant;Available PRN/intermittently Type of Home: House Home Access: Stairs to enter Entrance Stairs-Rails: None Entrance Stairs-Number of Steps: 2   Home Equipment: Walker - 2 wheels Additional Comments: Has a caregiver 8am to 8pm  Prior Function Level of Independence: Needs assistance   Gait / Transfers Assistance Needed: Able to ambulate short distances with RW; no recent falls  ADL's / Homemaking Assistance Needed:  Pt independent with dressing and toilet transfers/hygiene. Pt spongebathes at baseline.  Comments: Has caregiver assist 8am-8pm (assists pt with anything she needs)     Hand Dominance        Extremity/Trunk Assessment   Upper Extremity Assessment Upper Extremity Assessment: Generalized weakness    Lower Extremity Assessment Lower Extremity Assessment: Generalized weakness       Communication   Communication: Deaf  Cognition Arousal/Alertness: Awake/alert Behavior During Therapy: WFL for tasks assessed/performed Overall Cognitive Status: Within Functional Limits for tasks assessed                                        General Comments      Exercises     Assessment/Plan    PT Assessment Patient needs continued PT services  PT Problem List Decreased strength;Decreased activity tolerance;Decreased balance;Decreased mobility;Decreased knowledge of use of DME       PT Treatment Interventions DME instruction;Gait training;Stair training;Functional mobility training;Therapeutic activities;Therapeutic exercise;Balance training;Patient/family education    PT Goals (Current goals can be found in the Care Plan section)  Acute Rehab PT Goals Patient Stated Goal: To get stronger PT Goal Formulation: With patient Time For Goal Achievement: 08/28/21 Potential to Achieve Goals: Good    Frequency Min 2X/week   Barriers to discharge Inaccessible home environment      Co-evaluation               AM-PAC PT "6 Clicks" Mobility  Outcome Measure Help needed turning from your back to your side while in a flat bed without using bedrails?: A Little Help needed moving from lying on your back to sitting on the side of a flat bed without using bedrails?: A Little Help needed moving to and from a bed to a chair (including a wheelchair)?: A Little Help needed standing up from a chair using your arms (e.g., wheelchair or bedside chair)?: A Little Help needed to  walk in hospital room?: A Lot Help needed climbing 3-5 steps with a railing? : Total 6 Click Score: 15    End of Session Equipment Utilized During Treatment: Gait belt Activity Tolerance: Patient tolerated treatment well Patient left: Other (comment) (Pt left on BSC with OT entering for OT evaluation) Nurse Communication: Mobility status PT Visit Diagnosis: Muscle weakness (generalized) (M62.81);Unsteadiness on feet (R26.81);Difficulty in walking, not elsewhere classified (R26.2)    Time: 2549-8264 PT Time Calculation (min) (ACUTE ONLY): 35 min   Charges:   PT Evaluation $PT Eval Moderate Complexity: 1 Mod          D. Scott Mazzie Brodrick PT, DPT 08/15/21, 11:50 AM

## 2021-08-15 NOTE — Progress Notes (Addendum)
Patient ID: Jessica Erickson, female   DOB: 1926-04-04, 87 y.o.   MRN: [id number]  PROGRESS NOTE    MARIBETH JILES  [facility]:[id number] DOB: 1926/06/28 DOA: 08/13/2021 PCP: McLean-Scocuzza, Nino Glow, MD   Brief Narrative:  87 y.o. female with medical history significant for severe bilateral hearing loss, COPD, history of CVA with no residual deficits, hypothyroid, history of DVT on Eliquis, chronic bibasilar pulmonary scarring, CKD 3A, chronic respiratory failure requiring 2 L nasal cannula oxygen supplementation presented with not feeling well.  On presentation, she was on 3.5 L oxygen by nasal cannula.  WBC of 23.6; troponin was 17; lactic acid was within normal limits.  Chest x-ray in the ED showed pulmonary vascular congestion.  COVID 19 and influenza testing were negative.  She was given IV Lasix in the ED.  Assessment & Plan:   Acute on chronic hypoxic respiratory failure COPD exacerbation Pulmonary edema/acute on chronic diastolic heart failure Possible aspiration pneumonia: Present on admission  -Patient normally uses 2 L oxygen via nasal cannula; presented requiring 3.5 L oxygen by nasal cannula.   -Currently still on 5 L oxygen via nasal cannula. -Chest x-ray on presentation showed pulmonary congestion.  Patient was given 1 dose of IV Lasix and diuresed well. -Still looks volume overloaded.  Blood pressure slightly improved this morning: We will give 1 more dose of IV Lasix today. -Continue nebs.  Continue Solu-Medrol. -CT of the chest showed dependent consolidation in lower lobes, fever aspiration/infection versus atelectasis.  Start Unasyn.  Leukocytosis -Improving.  -Antibiotics plan as above  Trigeminal neuralgia -Continue gabapentin, at a reduced dose due to renal function.  Used to be on Tegretol but this has been already discontinued by her neurologist due to medication incompatibility with her current blood thinners  OSA -Continue CPAP nightly  Severe bilateral hearing  loss  Hypothyroidism -Continue levothyroxine  History of DVT -Continue Eliquis  Hyperlipidemia -Continue statin  Anxiety/depression -Continue mirtazapine  Chronic kidney disease stage IIIa -Creatinine stable.  Monitor  History of CVA -Currently stable  Generalized deconditioning -Overall prognosis is guarded to poor.   -Palliative care consultation for goals of care discussion   DVT prophylaxis: Eliquis Code Status: DNR/DNI Family Communication: Spoke to son/[name] on phone on 08/15/2021 Disposition Plan: Status is: Inpatient  Remains inpatient appropriate because:Inpatient level of care appropriate due to severity of illness  Dispo: The patient is from: SNF              Anticipated d/c is to: SNF              Patient currently is not medically stable to d/c.   Difficult to place patient No   Consultants: Consult palliative care  Procedures: None  Antimicrobials: None   Subjective: Patient seen and examined at bedside.  Very hard of hearing.  Communicated via Archivist.  Feels slightly better breathing wise.  No vomiting, fever or agitation reported.  Objective: Vitals:   08/14/21 2038 08/14/21 2045 08/15/21 0622 08/15/21 0719  BP: (!) 105/49  128/60 (!) 120/53  Pulse: 61  66 63  Resp: 16  18 16   Temp: 98.2 F (36.8 C)  (!) 97.3 F (36.3 C) 98 F (36.7 C)  TempSrc: Oral  Oral Oral  SpO2: 94% 94% 94% 94%  Weight:      Height:        Intake/Output Summary (Last 24 hours) at 08/15/2021 0725 Last data filed at 08/15/2021 0713 Gross per 24 hour  Intake 240 ml  Output  825 ml  Net -585 ml    Filed Weights   08/13/21 1811  Weight: 94 kg    Examination:  General exam: Elderly female lying in bed.  No distress.  On 5 L oxygen via nasal cannula.  Very hard of hearing. Respiratory system: Decreased breath sounds at bases bilaterally with some crackles  cardiovascular system: Rate controlled, S1-S2 heard gastrointestinal system: Abdomen is distended  slightly, soft and nontender.  Bowel sounds are heard  extremities: Lower extremity edema present bilaterally; no clubbing central nervous system: More awake, responds appropriately when asked questions on writing board.  No focal neurological deficits.  Moves extremities  skin: No obvious ecchymosis/rashes  psychiatry: Affect is flat.  Data Reviewed: I have personally reviewed following labs and imaging studies  CBC: Recent Labs  Lab 08/13/21 1332 08/14/21 0439 08/15/21 0424  WBC 23.6* 24.0* 17.0*  NEUTROABS  --   --  16.1*  HGB 13.9 13.5 13.0  HCT 42.0 40.7 38.5  MCV 94.4 94.2 93.9  PLT 192 192 465    Basic Metabolic Panel: Recent Labs  Lab 08/13/21 1332 08/14/21 0439 08/15/21 0424  NA 138 140 138  K 4.0 3.9 4.0  CL 104 101 99  CO2 27 30 31   GLUCOSE 147* 129* 246*  BUN 19 23 31*  CREATININE 1.04* 1.26* 1.46*  CALCIUM 7.0* 8.2* 8.1*  MG  --  2.1 2.2  PHOS  --  4.5  --     GFR: Estimated Creatinine Clearance: 28.4 mL/min (A) (by C-G formula based on SCr of 1.46 mg/dL (H)). Liver Function Tests: Recent Labs  Lab 08/13/21 1332 08/15/21 0424  AST 32 13*  ALT 16 16  ALKPHOS 59 70  BILITOT 1.6* 0.9  PROT 5.1* 6.0*  ALBUMIN 2.6* 2.7*    No results for input(s): LIPASE, AMYLASE in the last 168 hours. No results for input(s): AMMONIA in the last 168 hours. Coagulation Profile: No results for input(s): INR, PROTIME in the last 168 hours. Cardiac Enzymes: No results for input(s): CKTOTAL, CKMB, CKMBINDEX, TROPONINI in the last 168 hours. BNP (last 3 results) No results for input(s): PROBNP in the last 8760 hours. HbA1C: No results for input(s): HGBA1C in the last 72 hours. CBG: Recent Labs  Lab 08/15/21 0722  GLUCAP 195*   Lipid Profile: No results for input(s): CHOL, HDL, LDLCALC, TRIG, CHOLHDL, LDLDIRECT in the last 72 hours. Thyroid Function Tests: Recent Labs    08/14/21 0439  TSH 0.459    Anemia Panel: Recent Labs    08/14/21 0439   VITAMINB12 404   Sepsis Labs: Recent Labs  Lab 08/13/21 1332 08/13/21 1654  PROCALCITON 0.13  --   LATICACIDVEN 1.5 1.0     Recent Results (from the past 240 hour(s))  Blood culture (single)     Status: None (Preliminary result)   Collection Time: 08/13/21  1:32 PM   Specimen: BLOOD  Result Value Ref Range Status   Specimen Description BLOOD RIGHT ANTECUBITAL  Final   Special Requests   Final    BOTTLES DRAWN AEROBIC AND ANAEROBIC Blood Culture results may not be optimal due to an inadequate volume of blood received in culture bottles   Culture   Final    NO GROWTH 2 DAYS Performed at [facility], [address] [place], [place] [address]    Report Status PENDING  Incomplete  Resp Panel by RT-PCR (Flu A&B, Covid) Nasopharyngeal Swab     Status: None   Collection Time: 08/13/21  2:06 PM   Specimen: Nasopharyngeal Swab; Nasopharyngeal(NP) swabs in vial transport medium  Result Value Ref Range Status   SARS Coronavirus 2 by RT PCR NEGATIVE NEGATIVE Final    Comment: (NOTE) SARS-CoV-2 target nucleic acids are NOT DETECTED.  The SARS-CoV-2 RNA is generally detectable in upper respiratory specimens during the acute phase of infection. The lowest concentration of SARS-CoV-2 viral copies this assay can detect is 138 copies/mL. A negative result does not preclude SARS-Cov-2 infection and should not be used as the sole basis for treatment or other patient management decisions. A negative result may occur with  improper specimen collection/handling, submission of specimen other than nasopharyngeal swab, presence of viral mutation(s) within the areas targeted by this assay, and inadequate number of viral copies(<138 copies/mL). A negative result must be combined with clinical observations, patient history, and epidemiological information. The expected result is Negative.  Fact Sheet for Patients:  EntrepreneurPulse.com.au  Fact Sheet for Healthcare  Providers:  IncredibleEmployment.be  This test is no t yet approved or cleared by the [place] FDA and  has been authorized for detection and/or diagnosis of SARS-CoV-2 by FDA under an Emergency Use Authorization (EUA). This EUA will remain  in effect (meaning this test can be used) for the duration of the COVID-19 declaration under Section 564(b)(1) of the Act, 21 U.S.C.section 360bbb-3(b)(1), unless the authorization is terminated  or revoked sooner.       Influenza A by PCR NEGATIVE NEGATIVE Final   Influenza B by PCR NEGATIVE NEGATIVE Final    Comment: (NOTE) The Xpert Xpress SARS-CoV-2/FLU/RSV plus assay is intended as an aid in the diagnosis of influenza from Nasopharyngeal swab specimens and should not be used as a sole basis for treatment. Nasal washings and aspirates are unacceptable for Xpert Xpress SARS-CoV-2/FLU/RSV testing.  Fact Sheet for Patients: EntrepreneurPulse.com.au  Fact Sheet for Healthcare Providers: IncredibleEmployment.be  This test is not yet approved or cleared by the [place] FDA and has been authorized for detection and/or diagnosis of SARS-CoV-2 by FDA under an Emergency Use Authorization (EUA). This EUA will remain in effect (meaning this test can be used) for the duration of the COVID-19 declaration under Section 564(b)(1) of the Act, 21 U.S.C. section 360bbb-3(b)(1), unless the authorization is terminated or revoked.  Performed at [facility], [address], [place], [place] [address]           Radiology Studies: CT CHEST WO CONTRAST  Result Date: 08/14/2021 CLINICAL DATA:  Chronic dyspnea EXAM: CT CHEST WITHOUT CONTRAST TECHNIQUE: Multidetector CT imaging of the chest was performed following the standard protocol without IV contrast. COMPARISON:  08/13/2021, 05/04/2021 FINDINGS: Cardiovascular: Unenhanced imaging of the heart and great vessels demonstrates no  pericardial effusion. Normal caliber of the thoracic aorta. There is diffuse atherosclerosis of the aorta and coronary vasculature unchanged. Mediastinum/Nodes: No enlarged mediastinal or axillary lymph nodes. Thyroid gland, trachea, and esophagus demonstrate no significant findings. Lungs/Pleura: Dependent areas of consolidation within the lower lobes could reflect hypoventilatory change or airspace disease. No effusion or pneumothorax. There is mild bronchiectasis. Mucoid material is seen filling the left mainstem bronchus. Upper Abdomen: No acute abnormality. Musculoskeletal: There are no acute or destructive bony lesions. Reconstructed images demonstrate no additional findings. IMPRESSION: 1. Dependent consolidation within the lower lobes, favor aspiration or infection over atelectasis. 2. Chronic bronchiectasis, with mucoid material filling the left lower lobe bronchus. 3.  Aortic Atherosclerosis (ICD10-I70.0). Electronically Signed   By: Randa Ngo M.D.   On: 08/14/2021 17:11  DG Chest Port 1 View  Result Date: 08/13/2021 CLINICAL DATA:  Weakness. EXAM: PORTABLE CHEST 1 VIEW COMPARISON:  Chest x-ray dated August 01, 2021. FINDINGS: The heart size and mediastinal contours are within normal limits. Pulmonary vascular congestion. Continued low lung volumes with bibasilar atelectasis/scarring. No focal consolidation, pleural effusion, or pneumothorax. No acute osseous abnormality. IMPRESSION: 1. Pulmonary vascular congestion. Electronically Signed   By: Titus Dubin M.D.   On: 08/13/2021 13:45        Scheduled Meds:  apixaban  2.5 mg Oral BID   atorvastatin  40 mg Oral q1800   fluticasone furoate-vilanterol  1 puff Inhalation Daily   And   umeclidinium bromide  1 puff Inhalation Daily   gabapentin  400 mg Oral TID   ipratropium-albuterol  3 mL Nebulization Q4H   levothyroxine  200 mcg Oral Q0600   loratadine  10 mg Oral Daily   melatonin  10 mg Oral QHS   methylPREDNISolone  (SOLU-MEDROL) injection  60 mg Intravenous Q6H   mirtazapine  15 mg Oral QHS   multivitamin with minerals  1 tablet Oral Daily   pantoprazole  40 mg Oral Daily   polyethylene glycol  17 g Oral Daily   Continuous Infusions:        Aline August, MD [facility] 08/15/2021, 7:25 AM

## 2021-08-15 NOTE — Evaluation (Signed)
Occupational Therapy Evaluation Patient Details Name: Jessica Erickson MRN: [id number] DOB: [birth year] Today's Date: 08/15/2021   History of Present Illness Pt is a 87 y.o. female with medical history significant for severe bilateral hearing loss, COPD, history of CVA with no residual deficits, hypothyroid, history of DVT on Eliquis, chronic bibasilar pulmonary scarring, CKD 3A, chronic respiratory failure requiring 2 L nasal cannula oxygen supplementation, presents to the emergency department for chief concerns of not feeling well.  MD assessment includes: Acute on chronic hypoxic respiratory failure, COPD exacerbation, Pulmonary edema/acute on chronic diastolic heart failure, possible aspiration pneumonia present on admission, leukocytosis, and generalized deconditioning.   Clinical Impression   Pt was seen for OT evaluation this date. Prior to hospital admission, pt was most recently at rehab getting stronger after recent hospitalization. Pt reports before that, was able to perform basic ADL with limited to no assist. Pt received on BSC at end of PT session, agreeable to OT, and denying complaints. Currently pt demonstrates impairments as described below (See OT problem list) which functionally limit her ability to perform ADL/self-care tasks at baseline level of independence. Pt currently requires CGA for repeated ADL transfers + RW, MAX A for pericare in standing, and supervision as she returned to bed. OT assisted pt with ordering both lunch and dinner. Dining services and Financial controller notified that pt is extremely hard of hearing and will need assist in ordering future meals. Pt would benefit from skilled OT services to address noted impairments and functional limitations (see below for any additional details) in order to maximize safety and independence while minimizing falls risk and caregiver burden. Upon hospital discharge, recommend return to STR to maximize pt safety and return to PLOF prior to  return home with assist.     Recommendations for follow up therapy are one component of a multi-disciplinary discharge planning process, led by the attending physician.  Recommendations may be updated based on patient status, additional functional criteria and insurance authorization.   Follow Up Recommendations  SNF    Equipment Recommendations  Other (comment) (defer to next venue)    Recommendations for Other Services       Precautions / Restrictions Precautions Precautions: Fall Restrictions Weight Bearing Restrictions: No      Mobility Bed Mobility Overal bed mobility: Needs Assistance Bed Mobility: Sit to Supine     Supine to sit: Supervision Sit to supine: Supervision   General bed mobility comments: Extra time and effort and use of bed rail    Transfers Overall transfer level: Needs assistance Equipment used: Rolling walker (2 wheeled) Transfers: Sit to/from [employer] Transfers Sit to Stand: Min guard;From elevated surface Stand pivot transfers: Min guard       General transfer comment: Pt performed with CGA from [facility] x2 and from [facility] x2.    Balance Overall balance assessment: Needs assistance Sitting-balance support: No upper extremity supported;Feet supported Sitting balance-Leahy Scale: Good     Standing balance support: Bilateral upper extremity supported;During functional activity Standing balance-Leahy Scale: Fair Standing balance comment: Mod lean on the RW for support                           ADL either performed or assessed with clinical judgement   ADL Overall ADL's : Needs assistance/impaired     Grooming: Sitting;Wash/dry face;Wash/dry hands;Applying deodorant;Supervision/safety;Set up                   Toilet Transfer: BSC;RW;Stand-pivot;Min  guard   Toileting- Clothing Manipulation and Hygiene: Maximal assistance;Sit to/from stand Toileting - Clothing Manipulation Details (indicate cue type and reason): MAX  A for peri-care             Vision         Perception     Praxis      Pertinent Vitals/Pain Pain Assessment: No/denies pain     Hand Dominance     Extremity/Trunk Assessment Upper Extremity Assessment Upper Extremity Assessment: Generalized weakness   Lower Extremity Assessment Lower Extremity Assessment: Generalized weakness       Communication Communication Communication: Deaf (OT used sharpie and paper to communicate with pt)   Cognition Arousal/Alertness: Awake/alert Behavior During Therapy: WFL for tasks assessed/performed Overall Cognitive Status: Within Functional Limits for tasks assessed                                 General Comments: pleasant and agreeable   General Comments  SpO2 >96% throughout session, pt denied SOB.    Exercises Other Exercises Other Exercises: Using paper and sharpie to communicate, pt educated in PLB during and after ADL to support breath recovery and safety during ADL/IADL/mobility.   Shoulder Instructions      Home Living Family/patient expects to be discharged to:: Skilled nursing facility Living Arrangements: Children Available Help at Discharge: Family;Personal care attendant;Available PRN/intermittently Type of Home: House Home Access: Stairs to enter [employer] of Steps: 2 Entrance Stairs-Rails: None       Bathroom Shower/Tub: Other (comment) (Spongebathes at baseline)   Bathroom Toilet: Handicapped height     Home Equipment: Environmental consultant - 2 wheels   Additional Comments: Has a caregiver 8am to 8pm      Prior Functioning/Environment Level of Independence: Needs assistance  Gait / Transfers Assistance Needed: Able to ambulate short distances with RW; no recent falls ADL's / Homemaking Assistance Needed: Pt independent with dressing and toilet transfers/hygiene. Pt spongebathes at baseline. Communication / Swallowing Assistance Needed: Pt is deaf. PLOF obtained from recent prior  admission Comments: Has caregiver assist 8am-8pm (assists pt with anything she needs)        OT Problem List: Decreased strength;Decreased range of motion;Decreased activity tolerance;Impaired balance (sitting and/or standing)      OT Treatment/Interventions: Self-care/ADL training;Therapeutic exercise;Energy conservation;DME and/or AE instruction;Therapeutic activities;Patient/family education;Balance training    OT Goals(Current goals can be found in the care plan section) Acute Rehab OT Goals Patient Stated Goal: To get stronger OT Goal Formulation: With patient Time For Goal Achievement: 08/29/21 Potential to Achieve Goals: Good ADL Goals Pt Will Perform Lower Body Dressing: with supervision;sit to/from stand Pt Will Transfer to Toilet: ambulating;bedside commode;with supervision Additional ADL Goal #1: Pt will utilize learned ECS during ADL tasks to support breath recovery, requiring PRN VC to initiate.  OT Frequency: Min 1X/week   Barriers to D/C:            Co-evaluation              AM-PAC OT "6 Clicks" Daily Activity     Outcome Measure Help from another person eating meals?: None Help from another person taking care of personal grooming?: A Little Help from another person toileting, which includes using toliet, bedpan, or urinal?: A Lot Help from another person bathing (including washing, rinsing, drying)?: A Lot Help from another person to put on and taking off regular upper body clothing?: A Little Help from another person to  put on and taking off regular lower body clothing?: A Lot 6 Click Score: 16   End of Session Equipment Utilized During Treatment: Rolling walker;Oxygen Nurse Communication: Other (comment) (let unit secretary know OT assisted pt in ordering lunch and dinner; large BM noted)  Activity Tolerance: Patient tolerated treatment well Patient left: in bed;with call bell/phone within reach;with bed alarm set  OT Visit Diagnosis: Muscle  weakness (generalized) (M62.81);Unsteadiness on feet (R26.81)                Time: 3468-8737 OT Time Calculation (min): 42 min Charges:  OT General Charges $OT Visit: 1 Visit OT Evaluation $OT Eval Moderate Complexity: 1 Mod OT Treatments $Self Care/Home Management : 23-37 mins  Ardeth Perfect., MPH, MS, OTR/L ascom [phone] 08/15/21, 1:16 PM

## 2021-08-15 NOTE — Progress Notes (Signed)
SLP Cancellation Note  Patient Details Name: Jessica Erickson MRN: [id number] DOB: 09/03/1926   Cancelled treatment:       Reason Eval/Treat Not Completed:  (chart reviewed; consulted pt/family and NSG). Per chart review, pt has had decreased lung function and decline since at least 2019: "Bronchiectasis with new mucoid impaction and areas of peribronchovascular ground-glass/consolidation in the right upper lobe, most indicative of bronchopneumonia" then. She had a Significant smoking habit per Son; current COPD w/ interstitial lung dis. Pt has multiple medical issues and is 87yo.  Post discussion w/ pt and Sons in room, it was agreed that pt will have a MBSS tomorrow for further education and information re: her swallowing - to determine if any chronic dysphagia and/or aspiration.  ST services will f/u then and w/ any recommendations re: diet and diet consistency. Recommended general aspiration precautions and Pills in Puree at this time. Pt and Son agreed. NSG updated.       Orinda Kenner, MS, CCC-SLP Speech Language Pathologist Rehab Services [phone] [facility] 08/15/2021, 3:50 PM

## 2021-08-15 NOTE — Consult Note (Signed)
Consultation Note Date: 08/15/2021   Patient Name: Jessica Erickson  DOB: [birth year]  MRN: [id number]  Age / Sex: 87 y.o., female  PCP: McLean-Scocuzza, Nino Glow, MD Referring Physician: Aline August, MD  Reason for Consultation: Establishing goals of care and Psychosocial/spiritual support  HPI/Patient Profile: 87 y.o. female  with past medical history of severe bilateral hearing loss, COPD, history of CVA with no residual deficits, hypothyroid, history of DVT on Eliquis, chronic bibasilar pulmonary scarring, CKD 3, chronic respiratory failure/2 [facility] [place] admitted on 08/13/2021 with acute on chronic hypoxic respiratory failure, COPD exacerbation, possible aspiration pneumonia present on admission.   Clinical Assessment and Goals of Care: I have reviewed medical records including EPIC notes, labs and imaging, received report from RN, assessed the patient.  Jessica Erickson is sitting up in bed with OT at bedside.  She greets me, making and mostly keeping eye contact.  She has hearing loss, but is able to communicate effectively by reading questions written by staff.  [name] is able to respond verbally.  OT shares that Jessica Erickson has done quite well with both PT and OT today.  [name] and I briefly talk about what has brought her into the hospital and the treatment plan.  She denies questions at this time.  We talked about discharge back to short-term rehab in the next day or 2.  She seems satisfied.  After leaving the room, son, Kami Kube, is seen in the hallway.  I introduced Palliative Medicine as specialized medical care for people living with serious illness. It focuses on providing relief from the symptoms and stress of a serious illness. The goal is to improve quality of life for both the patient and the family.  We discussed a brief life review of the patient.  Jessica Erickson husband died about 17 years ago.   [name] shares that she has 24/7 caregivers, mostly to monitor her medications.  He tells me that she enjoys sewing and crocheting/knitting.    We then focused on their current illness.  We talked about Jessica Erickson chest x-ray results.  Tammy shares that she was very sick, unresponsive when she was first admitted.  We talked about a relatively quick turnaround.  I draw a diagram of the chronic illness pathway, what is normal and expected.  The natural disease trajectory and expectations at EOL were discussed.  I share that the benefits of palliative medicine is that it encourages keeping Jessica Erickson at the center of decision-making, what she wants, not what we want for her.  I attempted to elicit values and goals of care important to the patient.  I encourage family to continue goals of care discussion with [name].  Just like I will get sick again, she will, also.  Advanced directives, concepts specific to code status, were considered and discussed.  Son endorses to "treat the treatable, but allowing natural passing", DNR status.  Palliative Care services outpatient were explained and offered.  [name] shares that he will discuss outpatient palliative services with  his family.  Discussed the importance of continued conversation with family and the medical providers regarding overall plan of care and treatment options, ensuring decisions are within the context of the patient's values and GOCs.  Questions and concerns were addressed.  The family was encouraged to call with questions or concerns.  PMT will continue to support holistically.  Conference with attending, bedside nursing staff, transition of care team related to patient condition, needs, goals of care, disposition. Jessica Erickson would benefit from speech therapy in addition to PT/OT.   HCPOA  HCPOA - [name].  [name] has 5 children.  Son [name] who is a Sport and exercise psychologist, son [name], daughter [name] who is a Equities trader.    SUMMARY  OF RECOMMENDATIONS   At this point continue to treat the treatable but no CPR or intubation Would benefit from further short-term rehab.   Ultimate goal is to return to her own home with 24/7 caregivers Rehospitalize as needed Considering outpatient palliative services    Code Status/Advance Care Planning: DNR  Symptom Management:  Per hospitalist, no additional needs at this time.  Palliative Prophylaxis:  No special needs at this time.  Additional Recommendations (Limitations, Scope, Preferences): Continue to treat the treatable but no CPR or intubation.  Psycho-social/Spiritual:  Desire for further Chaplaincy support:no Additional Recommendations: Caregiving  Support/Resources  Prognosis:  Unable to determine, based on outcomes.  6 to 12 months or less would not be surprising based on advanced age, chronic illness burden, functional status.  Discharge Planning:  Anticipate return to [facility] for short-term rehab, ultimately returning to her own home with 24/7 caregivers.       Primary Diagnoses: Present on Admission:  Shortness of breath  Anxiety and depression  Arthritis  COPD (chronic obstructive pulmonary disease) (HCC)  GERD (gastroesophageal reflux disease)  Hypertension  Insomnia  Obesity (BMI 30-39.9)  Trigeminal neuralgia  Pulmonary artery hypertension (HCC)  Physical deconditioning  Acute on chronic respiratory failure ([place])   I have reviewed the medical record, interviewed the patient and family, and examined the patient. The following aspects are pertinent.  Past Medical History:  Diagnosis Date   Acoustic neuroma (HCC)    Allergy    Asthma    Bilateral swelling of feet    and legs   Bladder infection    CAD (coronary artery disease)    Cataract    Change in voice    Compression fracture of body of thoracic vertebra (HCC)    T12 09/18/15 MRI s/p fall    Constipation    COPD (chronic obstructive pulmonary disease) (HCC)    previous  CXR with chronic interstitial lung dz    CVA (cerebral vascular accident) ([place])    Depression    Diabetes ([place])    with neuropathy   Diabetes mellitus, type 2 ([place])    Diarrhea    Double vision    DVT (deep venous thrombosis) ([place])    right leg 10/2015 was on coumadin off as of 2017/2018 ; s/p IVC filter   Enuresis    Eye pain, right    Fall    Fatty liver    09/15/15 also mildly dilated pancreatitic duct rec MRCP small sub cm cyst hemangioma speeln mild right hydronephrorossi and prox. hydroureter, kidney stones, mild scarring kidneys   Female stress incontinence    Flank pain    GERD (gastroesophageal reflux disease)    with small hiatal hernia    Hard of hearing    Heart  disease    History of kidney problems    Hyperlipidemia    mixed   Hypertension    Hypothyroidism, postsurgical    Impaired mobility and ADLs    uses rolling walker has caretaker 24/7 at home   Leg edema    Mixed incontinence urge and stress (female)(female)    Neuropathy    Osteoarthritis    DDD spine    Osteoporosis with fracture    T12 compression fracture   Photophobia    Pulmonary embolism ([place])    10/2015 off coumadin as of 04/2016   Pulmonary HTN (HCC)    mild pulm HTN, echo 10/09/15 EF 09-60%AVWUJ 1 dd, RV systolic pressure increased    Recurrent UTI    Sinus pressure    Skin cancer    BCC jawline and scalp    Thyroid disease    follows [facility]   TIA (transient ischemic attack)    MRI 2009/2010 neg stroke    Trigeminal neuralgia    Dr. Tomi Bamberger s/p gamma knife x 2, on Tegretol since 2011/2012 no increase in dose >200 mg bid rec per family per neurology    Urinary frequency    Urinary, incontinence, stress female    Dr Erlene Quan urology    Social History   Socioeconomic History   Marital status: Widowed    Spouse name: Not on file   Number of children: 5   Years of education: Not on file   Highest education level: Not on file  Occupational History   Occupation: retired   Tobacco Use   Smoking status: Former    Packs/day: 0.50    Years: 20.00    Pack years: 10.00    Types: Cigarettes    Quit date: 09/20/1995    Years since quitting: 25.9   Smokeless tobacco: Never   Tobacco comments:    quit 1996 smoked 20 years max 8 cig qd   Vaping Use   Vaping Use: Never used  Substance and Sexual Activity   Alcohol use: No   Drug use: No   Sexual activity: Not on file  Other Topics Concern   Not on file  Social History Narrative   Lives at home with caretaker currently [name]    Has children    Likes to ride stationary bike and sew             Social Determinants of Health   Financial Resource Strain: Low Risk    Difficulty of Paying Living Expenses: Not hard at all  Food Insecurity: No Food Insecurity   Worried About Charity fundraiser in the Last Year: Never true   Arboriculturist in the Last Year: Never true  Transportation Needs: No Transportation Needs   Lack of Transportation (Medical): No   Lack of Transportation (Non-Medical): No  Physical Activity: Insufficiently Active   Days of Exercise per Week: 7 days   Minutes of Exercise per Session: 20 min  Stress: No Stress Concern Present   Feeling of Stress : Not at all  Social Connections: Not on file   Family History  Problem Relation Age of Onset   Heart disease Mother    Diabetes Father    Cancer Daughter        breast ca x 2 s/p mastectomy    Scheduled Meds:  apixaban  2.5 mg Oral BID   atorvastatin  40 mg Oral q1800   fluticasone furoate-vilanterol  1 puff Inhalation Daily   And  umeclidinium bromide  1 puff Inhalation Daily   furosemide  40 mg Intravenous Daily   gabapentin  200 mg Oral TID   ipratropium-albuterol  3 mL Nebulization Q4H   levothyroxine  200 mcg Oral Q0600   loratadine  10 mg Oral Daily   melatonin  10 mg Oral QHS   methylPREDNISolone (SOLU-MEDROL) injection  40 mg Intravenous Q6H   mirtazapine  15 mg Oral QHS   multivitamin with minerals  1 tablet Oral  Daily   pantoprazole  40 mg Oral Daily   polyethylene glycol  17 g Oral Daily   Continuous Infusions:  cefTRIAXone (ROCEPHIN)  IV 2 g (08/15/21 1300)   metronidazole     PRN Meds:.acetaminophen **OR** acetaminophen, albuterol, guaiFENesin, ondansetron **OR** ondansetron (ZOFRAN) IV Medications Prior to Admission:  Prior to Admission medications   Medication Sig Start Date End Date Taking? Authorizing Provider  acetaminophen-codeine (TYLENOL #3) 300-30 MG tablet Take 1 tablet by mouth 2 (two) times daily as needed for moderate pain. 08/04/21  Yes Danford, Suann Larry, MD  apixaban (ELIQUIS) 2.5 MG TABS tablet Take 1 tablet (2.5 mg total) by mouth 2 (two) times daily. 03/29/21  Yes McLean-Scocuzza, Nino Glow, MD  atorvastatin (LIPITOR) 40 MG tablet Take 1 tablet (40 mg total) by mouth daily at 6 PM. Generic ok Patient taking differently: Take 40 mg by mouth daily. Generic ok 03/29/21  Yes McLean-Scocuzza, Nino Glow, MD  Calcium Carbonate-Vitamin D (CALCIUM 600+D) 600-400 MG-UNIT tablet Take 1 tablet by mouth 2 (two) times daily. Lunch and dinner 07/23/19  Yes McLean-Scocuzza, Nino Glow, MD  cetirizine (ZYRTEC) 10 MG tablet Take 1 tablet (10 mg total) by mouth daily. 03/29/21  Yes McLean-Scocuzza, Nino Glow, MD  Fluticasone-Umeclidin-Vilant (TRELEGY ELLIPTA) 100-62.5-25 MCG/INH AEPB Inhale 1 puff into the lungs daily. Rinse mouth d/c symbicort and spiriva 03/29/21  Yes McLean-Scocuzza, Nino Glow, MD  gabapentin (NEURONTIN) 400 MG capsule Take 1 capsule (400 mg total) by mouth 3 (three) times daily. 11/11/20  Yes McLean-Scocuzza, Nino Glow, MD  levothyroxine (SYNTHROID) 200 MCG tablet Take 1 tablet (200 mcg total) by mouth daily before breakfast. Except on Sunday. Do not take with other medications or vitamins 03/29/21  Yes McLean-Scocuzza, Nino Glow, MD  Melatonin 10 MG TABS Take 10 mg by mouth at bedtime. 04/19/21  Yes McLean-Scocuzza, Nino Glow, MD  mirtazapine (REMERON) 15 MG tablet Take 1 tablet (15 mg total) by  mouth at bedtime. 03/29/21  Yes McLean-Scocuzza, Nino Glow, MD  Multiple Vitamin (MULTIVITAMIN WITH MINERALS) TABS tablet Take 1 tablet by mouth daily.    Yes [provider]  pantoprazole (PROTONIX) 40 MG tablet Take 1 tablet (40 mg total) by mouth daily. 30 minutes before lunch or dinner 03/29/21  Yes McLean-Scocuzza, Nino Glow, MD  polyvinyl alcohol (LIQUIFILM TEARS) 1.4 % ophthalmic solution Place 1 drop into both eyes at bedtime. 08/04/21  Yes Danford, Suann Larry, MD  acetaminophen (TYLENOL) 325 MG tablet Take 2 tablets (650 mg total) by mouth every 6 (six) hours as needed. 07/30/20   McLean-Scocuzza, Nino Glow, MD  albuterol (PROVENTIL) (2.5 MG/3ML) 0.083% nebulizer solution Take 3 mLs (2.5 mg total) by nebulization in the morning, at noon, and at bedtime. 08/04/21   Danford, Suann Larry, MD  BISACODYL PO Take by mouth.    [provider]  clotrimazole-betamethasone (LOTRISONE) lotion Apply 1 application topically 2 (two) times daily. 10/11/20   [provider]  Docusate Sodium (DOK PO) Take 1 tablet by mouth daily.    [provider]  feeding supplement, GLUCERNA SHAKE, (GLUCERNA SHAKE) LIQD Take 237 mLs by mouth 2 (two) times daily between meals. 08/04/21   Danford, Suann Larry, MD  guaiFENesin (MUCINEX) 600 MG 12 hr tablet Take 2 tablets (1,200 mg total) by mouth 2 (two) times daily as needed for cough or to loosen phlegm. 05/05/21   Chesley Mires, MD  polyethylene glycol powder (GLYCOLAX/MIRALAX) 17 GM/SCOOP powder Take 17 g by mouth daily. 04/06/20   McLean-Scocuzza, Nino Glow, MD  predniSONE (DELTASONE) 10 MG tablet Take 40 mg for 1 day then 30 mg for 2 days then 20 mg for 2 days then 10 mg for 2 days then stop Patient not taking: Reported on 08/13/2021 08/05/21   Edwin Dada, MD   Allergies  Allergen Reactions   Penicillins Shortness Of Breath, Rash and Other (See Comments)    Has patient had a PCN reaction causing immediate rash, facial/tongue/throat  swelling, SOB or lightheadedness with hypotension: Yes Has patient had a PCN reaction causing severe rash involving mucus membranes or skin necrosis: No Has patient had a PCN reaction that required hospitalization No Has patient had a PCN reaction occurring within the last 10 years: No If all of the above answers are "NO", then may proceed with Cephalosporin use.   Sulfa Antibiotics Shortness Of Breath, Rash and Other (See Comments)   Amitiza [Lubiprostone]     N/v/d   Aspirin Other (See Comments)    Reaction:  Unknown  Other reaction(s): Bleeding (intolerance) Per patient " causes nose to bleed" Can take 81 mg daily without any complications Other reaction(s): "bloody nose"    Penicillin G Other (See Comments)    Has patient had a PCN reaction causing immediate rash, facial/tongue/throat swelling, SOB or lightheadedness with hypotension: No Has patient had a PCN reaction causing severe rash involving mucus membranes or skin necrosis: Unknown Has patient had a PCN reaction that required hospitalization: Unknown Has patient had a PCN reaction occurring within the last 10 years: Unknown If all of the above answers are "NO", then may proceed with Cephalosporin use.   Review of Systems  Unable to perform ROS: Other   Physical Exam Vitals and nursing note reviewed.  Constitutional:      General: She is not in acute distress.    Appearance: Normal appearance. She is not ill-appearing.  HENT:     Head: Atraumatic.     Mouth/Throat:     Mouth: Mucous membranes are moist.  Cardiovascular:     Rate and Rhythm: Normal rate.  Pulmonary:     Effort: Pulmonary effort is normal. No respiratory distress.  Skin:    General: Skin is warm and dry.  Neurological:     General: No focal deficit present.     Mental Status: She is alert.  Psychiatric:        Mood and Affect: Mood normal.        Behavior: Behavior normal.    Vital Signs: BP (!) 120/53 (BP Location: Left Arm)   Pulse 63    Temp 98 F (36.7 C) (Oral)   Resp 16   Ht 5\' 7"  (1.702 m)   Wt 94 kg   LMP  (LMP Unknown)   SpO2 94%   BMI 32.46 kg/m  Pain Scale: 0-10   Pain Score: 0-No pain   SpO2: SpO2: 94 % O2 Device:SpO2: 94 % O2 Flow Rate: .O2 Flow Rate (L/min): 5 L/min  IO: Intake/output summary:  Intake/Output Summary (Last 24 hours) at 08/15/2021  [place] filed at 08/15/2021 2671 Gross per 24 hour  Intake 240 ml  Output 600 ml  Net -360 ml    LBM:   Baseline Weight: Weight: 94 kg Most recent weight: Weight: 94 kg     Palliative Assessment/Data:   Flowsheet Rows    Flowsheet Row Most Recent Value  Intake Tab   Referral Department Hospitalist  Unit at Time of Referral Med/Surg Unit  Palliative Care Primary Diagnosis Pulmonary  Date Notified 08/14/21  Palliative Care Type New Palliative care  Reason for referral Clarify Goals of Care  Date of Admission 08/13/21  Date first seen by Palliative Care 08/15/21  # of days Palliative referral response time 1 Day(s)  # of days IP prior to Palliative referral 1  Clinical Assessment   Palliative Performance Scale Score 60%  Pain Max last 24 hours Not able to report  Pain Min Last 24 hours Not able to report  Dyspnea Max Last 24 Hours Not able to report  Dyspnea Min Last 24 hours Not able to report  Psychosocial & Spiritual Assessment   Palliative Care Outcomes        Time In: 0950 Time Out: 1100 Time Total: 70 minutes  Greater than 50%  of this time was spent counseling and coordinating care related to the above assessment and plan.  Signed by: Drue Novel, NP   Please contact Palliative Medicine Team phone at [phone] for questions and concerns.  For individual provider: See Shea Evans

## 2021-08-16 ENCOUNTER — Inpatient Hospital Stay: Payer: Medicare PPO

## 2021-08-16 ENCOUNTER — Telehealth: Payer: Self-pay

## 2021-08-16 LAB — MAGNESIUM: Magnesium: 1.6 mg/dL — ABNORMAL LOW (ref 1.7–2.4)

## 2021-08-16 LAB — CBC WITH DIFFERENTIAL/PLATELET
Abs Immature Granulocytes: 0.12 10*3/uL — ABNORMAL HIGH (ref 0.00–0.07)
Basophils Absolute: 0 10*3/uL (ref 0.0–0.1)
Basophils Relative: 0 %
Eosinophils Absolute: 0 10*3/uL (ref 0.0–0.5)
Eosinophils Relative: 0 %
HCT: 40 % (ref 36.0–46.0)
Hemoglobin: 13 g/dL (ref 12.0–15.0)
Immature Granulocytes: 1 %
Lymphocytes Relative: 2 %
Lymphs Abs: 0.4 10*3/uL — ABNORMAL LOW (ref 0.7–4.0)
MCH: 30.3 pg (ref 26.0–34.0)
MCHC: 32.5 g/dL (ref 30.0–36.0)
MCV: 93.2 fL (ref 80.0–100.0)
Monocytes Absolute: 0.2 10*3/uL (ref 0.1–1.0)
Monocytes Relative: 1 %
Neutro Abs: 14.6 10*3/uL — ABNORMAL HIGH (ref 1.7–7.7)
Neutrophils Relative %: 96 %
Platelets: 189 10*3/uL (ref 150–400)
RBC: 4.29 MIL/uL (ref 3.87–5.11)
RDW: 14.3 % (ref 11.5–15.5)
WBC: 15.3 10*3/uL — ABNORMAL HIGH (ref 4.0–10.5)
nRBC: 0 % (ref 0.0–0.2)

## 2021-08-16 LAB — BASIC METABOLIC PANEL
Anion gap: 9 (ref 5–15)
BUN: 42 mg/dL — ABNORMAL HIGH (ref 8–23)
CO2: 30 mmol/L (ref 22–32)
Calcium: 8.1 mg/dL — ABNORMAL LOW (ref 8.9–10.3)
Chloride: 98 mmol/L (ref 98–111)
Creatinine, Ser: 1.41 mg/dL — ABNORMAL HIGH (ref 0.44–1.00)
GFR, Estimated: 35 mL/min — ABNORMAL LOW (ref >60)
Glucose, Bld: 329 mg/dL — ABNORMAL HIGH (ref 70–99)
Potassium: 3.8 mmol/L (ref 3.5–5.1)
Sodium: 137 mmol/L (ref 135–145)

## 2021-08-16 LAB — GLUCOSE, CAPILLARY
Glucose-Capillary: 342 mg/dL — ABNORMAL HIGH (ref 70–99)
Glucose-Capillary: 309 mg/dL — ABNORMAL HIGH (ref 70–99)
Glucose-Capillary: 296 mg/dL — ABNORMAL HIGH (ref 70–99)

## 2021-08-16 MED ORDER — MAGNESIUM SULFATE 2 GM/50ML IV SOLN
2.0000 g | Freq: Once | INTRAVENOUS | Status: AC
  Administered 2021-08-16: 2 g via INTRAVENOUS
  Filled 2021-08-16: qty 50

## 2021-08-16 MED ORDER — INSULIN ASPART 100 UNIT/ML IJ SOLN
0.0000 [IU] | Freq: Three times a day (TID) | INTRAMUSCULAR | Status: DC
  Administered 2021-08-16 (×2): 11 [IU] via SUBCUTANEOUS
  Administered 2021-08-17: 13:00:00 8 [IU] via SUBCUTANEOUS
  Administered 2021-08-17: 18:00:00 11 [IU] via SUBCUTANEOUS
  Administered 2021-08-17: 5 [IU] via SUBCUTANEOUS
  Administered 2021-08-18 – 2021-08-19 (×2): 3 [IU] via SUBCUTANEOUS
  Administered 2021-08-19: 12:00:00 5 [IU] via SUBCUTANEOUS
  Filled 2021-08-16 (×8): qty 1

## 2021-08-16 MED ORDER — IPRATROPIUM-ALBUTEROL 0.5-2.5 (3) MG/3ML IN SOLN
3.0000 mL | Freq: Four times a day (QID) | RESPIRATORY_TRACT | Status: DC
  Administered 2021-08-16 – 2021-08-18 (×6): 3 mL via RESPIRATORY_TRACT
  Filled 2021-08-16 (×6): qty 3

## 2021-08-16 MED ORDER — INSULIN ASPART 100 UNIT/ML IJ SOLN
0.0000 [IU] | Freq: Every day | INTRAMUSCULAR | Status: DC
  Administered 2021-08-16: 22:00:00 3 [IU] via SUBCUTANEOUS
  Administered 2021-08-17: 22:00:00 2 [IU] via SUBCUTANEOUS
  Administered 2021-08-18: 23:00:00 4 [IU] via SUBCUTANEOUS
  Filled 2021-08-16 (×2): qty 1

## 2021-08-16 NOTE — Progress Notes (Addendum)
Patient ID: Jessica Erickson, female   DOB: 01-Jan-1926, 87 y.o.   MRN: [id number]  PROGRESS NOTE    Jessica Erickson  FIE:[id number] DOB: Feb 14, 1926 DOA: 08/13/2021 PCP: McLean-Scocuzza, Nino Glow, MD   Brief Narrative:  87 y.o. female with medical history significant for severe bilateral hearing loss, COPD, history of CVA with no residual deficits, hypothyroid, history of DVT on Eliquis, chronic bibasilar pulmonary scarring, CKD 3A, chronic respiratory failure requiring 2 L nasal cannula oxygen supplementation presented with not feeling well.  On presentation, she was on 3.5 L oxygen by nasal cannula.  WBC of 23.6; troponin was 17; lactic acid was within normal limits.  Chest x-ray in the ED showed pulmonary vascular congestion.  COVID 19 and influenza testing were negative.  She was given IV Lasix in the ED. she was subsequently also started on IV Solu-Medrol and antibiotics.  Palliative care was consulted for goals of care discussion.  Assessment & Plan:   Acute on chronic hypoxic respiratory failure COPD exacerbation Pulmonary edema/acute on chronic diastolic heart failure Possible aspiration pneumonia: Present on admission -Patient normally uses 2 L oxygen via nasal cannula; presented requiring 3.5 L oxygen by nasal cannula.   -Currently still on 5 L oxygen via nasal cannula. -Chest x-ray on presentation showed pulmonary congestion.  -Patient has received 2 doses of IV Lasix so far during this hospitalization and not on scheduled Lasix because of low blood pressure.  Blood pressure low this morning: If blood pressure improves, will give 1 more dose of IV Lasix. -Continue nebs.  Continue Solu-Medrol at current dose for 1 more day. -CT of the chest showed dependent consolidation in lower lobes, fever aspiration/infection versus atelectasis.  Started Rocephin and Flagyl on 08/15/2021.  Leukocytosis -Labs pending today. -Antibiotics plan as above  Trigeminal neuralgia -Continue gabapentin, at a  reduced dose due to renal function.  Used to be on Tegretol but this has been already discontinued by her neurologist due to medication incompatibility with her current blood thinners  OSA -Continue CPAP nightly  Severe bilateral hearing loss  Hypothyroidism -Continue levothyroxine  History of DVT -Continue Eliquis  Hyperlipidemia -Continue statin  Anxiety/depression -Continue mirtazapine  Chronic kidney disease stage IIIa -Creatinine stable.  Monitor  History of CVA -Currently stable  Generalized deconditioning -Overall prognosis is guarded to poor.   -Palliative care following.  DVT prophylaxis: Eliquis Code Status: DNR/DNI Family Communication: Spoke to son/Tony at bedside on 08/16/2021 and to son/[name] on phone on 08/16/2021 Disposition Plan: Status is: Inpatient  Remains inpatient appropriate because:Inpatient level of care appropriate due to severity of illness  Dispo: The patient is from: SNF              Anticipated d/c is to: SNF              Patient currently is not medically stable to d/c.   Difficult to place patient No   Consultants: palliative care  Procedures: None  Antimicrobials: Rocephin and Flagyl from 08/15/2021 onwards  Subjective: Patient seen and examined at bedside.  Very hard of hearing.  Son present at bedside.  No fevers, vomiting or chest pain reported.  Still short of breath but feels slightly better. Objective: Vitals:   08/15/21 2143 08/15/21 2352 08/16/21 0144 08/16/21 0510  BP:  116/67  110/60  Pulse:  66  64  Resp:  16  17  Temp:  98.1 F (36.7 C)  98 F (36.7 C)  TempSrc:  Oral    SpO2: 94% 98%  95% 96%  Weight:      Height:        Intake/Output Summary (Last 24 hours) at 08/16/2021 0738 Last data filed at 08/16/2021 0600 Gross per 24 hour  Intake 437.92 ml  Output 1050 ml  Net -612.08 ml    Filed Weights   08/13/21 1811  Weight: 94 kg    Examination:  General exam: Elderly female lying in bed.  Currently on 5  L oxygen via nasal cannula.  No acute distress.  Very hard of hearing. Respiratory system: Bilateral decreased breath sounds at bases with crackles  cardiovascular system: S1-S2 heard, rate controlled  gastrointestinal system: Abdomen is mildly distended, soft and nontender.  Normal bowel sounds heard extremities: No cyanosis; bilateral lower extremity edema present central nervous system: Awake. No focal neurological deficits.  Moving extremities  skin: No obvious petechiae/lesions  psychiatry: Flat affect  Data Reviewed: I have personally reviewed following labs and imaging studies  CBC: Recent Labs  Lab 08/13/21 1332 08/14/21 0439 08/15/21 0424  WBC 23.6* 24.0* 17.0*  NEUTROABS  --   --  16.1*  HGB 13.9 13.5 13.0  HCT 42.0 40.7 38.5  MCV 94.4 94.2 93.9  PLT 192 192 008    Basic Metabolic Panel: Recent Labs  Lab 08/13/21 1332 08/14/21 0439 08/15/21 0424  NA 138 140 138  K 4.0 3.9 4.0  CL 104 101 99  CO2 27 30 31   GLUCOSE 147* 129* 246*  BUN 19 23 31*  CREATININE 1.04* 1.26* 1.46*  CALCIUM 7.0* 8.2* 8.1*  MG  --  2.1 2.2  PHOS  --  4.5  --     GFR: Estimated Creatinine Clearance: 28.4 mL/min (A) (by C-G formula based on SCr of 1.46 mg/dL (H)). Liver Function Tests: Recent Labs  Lab 08/13/21 1332 08/15/21 0424  AST 32 13*  ALT 16 16  ALKPHOS 59 70  BILITOT 1.6* 0.9  PROT 5.1* 6.0*  ALBUMIN 2.6* 2.7*    No results for input(s): LIPASE, AMYLASE in the last 168 hours. No results for input(s): AMMONIA in the last 168 hours. Coagulation Profile: No results for input(s): INR, PROTIME in the last 168 hours. Cardiac Enzymes: No results for input(s): CKTOTAL, CKMB, CKMBINDEX, TROPONINI in the last 168 hours. BNP (last 3 results) No results for input(s): PROBNP in the last 8760 hours. HbA1C: No results for input(s): HGBA1C in the last 72 hours. CBG: Recent Labs  Lab 08/15/21 0722  GLUCAP 195*    Lipid Profile: No results for input(s): CHOL, HDL,  LDLCALC, TRIG, CHOLHDL, LDLDIRECT in the last 72 hours. Thyroid Function Tests: Recent Labs    08/14/21 0439  TSH 0.459    Anemia Panel: Recent Labs    08/14/21 0439  VITAMINB12 404    Sepsis Labs: Recent Labs  Lab 08/13/21 1332 08/13/21 1654  PROCALCITON 0.13  --   LATICACIDVEN 1.5 1.0     Recent Results (from the past 240 hour(s))  Blood culture (single)     Status: None (Preliminary result)   Collection Time: 08/13/21  1:32 PM   Specimen: BLOOD  Result Value Ref Range Status   Specimen Description BLOOD RIGHT ANTECUBITAL  Final   Special Requests   Final    BOTTLES DRAWN AEROBIC AND ANAEROBIC Blood Culture results may not be optimal due to an inadequate volume of blood received in culture bottles   Culture   Final    NO GROWTH 2 DAYS Performed at [facility], [address],  [place], [place] [address]    Report Status PENDING  Incomplete  Resp Panel by RT-PCR (Flu A&B, Covid) Nasopharyngeal Swab     Status: None   Collection Time: 08/13/21  2:06 PM   Specimen: Nasopharyngeal Swab; Nasopharyngeal(NP) swabs in vial transport medium  Result Value Ref Range Status   SARS Coronavirus 2 by RT PCR NEGATIVE NEGATIVE Final    Comment: (NOTE) SARS-CoV-2 target nucleic acids are NOT DETECTED.  The SARS-CoV-2 RNA is generally detectable in upper respiratory specimens during the acute phase of infection. The lowest concentration of SARS-CoV-2 viral copies this assay can detect is 138 copies/mL. A negative result does not preclude SARS-Cov-2 infection and should not be used as the sole basis for treatment or other patient management decisions. A negative result may occur with  improper specimen collection/handling, submission of specimen other than nasopharyngeal swab, presence of viral mutation(s) within the areas targeted by this assay, and inadequate number of viral copies(<138 copies/mL). A negative result must be combined with clinical observations,  patient history, and epidemiological information. The expected result is Negative.  Fact Sheet for Patients:  EntrepreneurPulse.com.au  Fact Sheet for Healthcare Providers:  IncredibleEmployment.be  This test is no t yet approved or cleared by the [place] FDA and  has been authorized for detection and/or diagnosis of SARS-CoV-2 by FDA under an Emergency Use Authorization (EUA). This EUA will remain  in effect (meaning this test can be used) for the duration of the COVID-19 declaration under Section 564(b)(1) of the Act, 21 U.S.C.section 360bbb-3(b)(1), unless the authorization is terminated  or revoked sooner.       Influenza A by PCR NEGATIVE NEGATIVE Final   Influenza B by PCR NEGATIVE NEGATIVE Final    Comment: (NOTE) The Xpert Xpress SARS-CoV-2/FLU/RSV plus assay is intended as an aid in the diagnosis of influenza from Nasopharyngeal swab specimens and should not be used as a sole basis for treatment. Nasal washings and aspirates are unacceptable for Xpert Xpress SARS-CoV-2/FLU/RSV testing.  Fact Sheet for Patients: EntrepreneurPulse.com.au  Fact Sheet for Healthcare Providers: IncredibleEmployment.be  This test is not yet approved or cleared by the [place] FDA and has been authorized for detection and/or diagnosis of SARS-CoV-2 by FDA under an Emergency Use Authorization (EUA). This EUA will remain in effect (meaning this test can be used) for the duration of the COVID-19 declaration under Section 564(b)(1) of the Act, 21 U.S.C. section 360bbb-3(b)(1), unless the authorization is terminated or revoked.  Performed at [facility], [address] [place], [place] [address]           Radiology Studies: CT CHEST WO CONTRAST  Result Date: 08/14/2021 CLINICAL DATA:  Chronic dyspnea EXAM: CT CHEST WITHOUT CONTRAST TECHNIQUE: Multidetector CT imaging of the chest was  performed following the standard protocol without IV contrast. COMPARISON:  08/13/2021, 05/04/2021 FINDINGS: Cardiovascular: Unenhanced imaging of the heart and great vessels demonstrates no pericardial effusion. Normal caliber of the thoracic aorta. There is diffuse atherosclerosis of the aorta and coronary vasculature unchanged. Mediastinum/Nodes: No enlarged mediastinal or axillary lymph nodes. Thyroid gland, trachea, and esophagus demonstrate no significant findings. Lungs/Pleura: Dependent areas of consolidation within the lower lobes could reflect hypoventilatory change or airspace disease. No effusion or pneumothorax. There is mild bronchiectasis. Mucoid material is seen filling the left mainstem bronchus. Upper Abdomen: No acute abnormality. Musculoskeletal: There are no acute or destructive bony lesions. Reconstructed images demonstrate no additional findings. IMPRESSION: 1. Dependent consolidation within the lower lobes, favor aspiration or infection over  atelectasis. 2. Chronic bronchiectasis, with mucoid material filling the left lower lobe bronchus. 3.  Aortic Atherosclerosis (ICD10-I70.0). Electronically Signed   By: Randa Ngo M.D.   On: 08/14/2021 17:11        Scheduled Meds:  apixaban  2.5 mg Oral BID   atorvastatin  40 mg Oral q1800   fluticasone furoate-vilanterol  1 puff Inhalation Daily   And   umeclidinium bromide  1 puff Inhalation Daily   furosemide  40 mg Intravenous Daily   gabapentin  200 mg Oral TID   ipratropium-albuterol  3 mL Nebulization Q4H   levothyroxine  200 mcg Oral Q0600   loratadine  10 mg Oral Daily   melatonin  10 mg Oral QHS   methylPREDNISolone (SOLU-MEDROL) injection  40 mg Intravenous Q6H   mirtazapine  15 mg Oral QHS   multivitamin with minerals  1 tablet Oral Daily   pantoprazole  40 mg Oral Daily   polyethylene glycol  17 g Oral Daily   Continuous Infusions:  cefTRIAXone (ROCEPHIN)  IV 2 g (08/15/21 1300)   metronidazole 500 mg (08/15/21  2359)          Aline August, MD [facility] 08/16/2021, 7:38 AM

## 2021-08-16 NOTE — Progress Notes (Signed)
   08/13/21 2024  Assess: MEWS Score  Temp 98.4 F (36.9 C)  BP 96/61  Pulse Rate 70  Resp (!) 30  SpO2 92 %  O2 Device Nasal Cannula  O2 Flow Rate (L/min) 3.5 L/min  Assess: MEWS Score  MEWS Temp 0  MEWS Systolic 1  MEWS Pulse 0  MEWS RR 2  MEWS LOC 0  MEWS Score 3  MEWS Score Color Yellow  Assess: if the MEWS score is Yellow or Red  Were vital signs taken at a resting state? Yes  Focused Assessment Change from prior assessment (see assessment flowsheet)  Does the patient meet 2 or more of the SIRS criteria? No (SIRS 1)  MEWS guidelines implemented *See Row Information* Yes  Treat  MEWS Interventions Escalated (See documentation below)  Pain Scale 0-10  Pain Score 0  Take Vital Signs  Increase Vital Sign Frequency  Yellow: Q 2hr X 2 then Q 4hr X 2, if remains yellow, continue Q 4hrs  Escalate  MEWS: Escalate Yellow: discuss with charge nurse/RN and consider discussing with provider and RRT  Notify: Charge Nurse/RN  Name of Charge Nurse/RN Notified [place]  Date Charge Nurse/RN Notified 08/13/21  Time Charge Nurse/RN Notified 2130  Notify: Provider  Provider Name/Title Argie Ramming MD  Date Provider Notified 08/13/21  Time Provider Notified 2138  Notification Type Page  Notification Reason Change in status (respirations 30)  Provider response See new orders  Date of Provider Response 08/13/21  Time of Provider Response 2145  Assess: SIRS CRITERIA  SIRS Temperature  0  SIRS Pulse 0  SIRS Respirations  1  SIRS WBC 0  SIRS Score Sum  1  Inserted for Agnes Lawrence RN

## 2021-08-16 NOTE — Progress Notes (Signed)
Inpatient Diabetes Program Recommendations  AACE/ADA: New Consensus Statement on Inpatient Glycemic Control (2015)  Target Ranges:  Prepandial:   less than 140 mg/dL      Peak postprandial:   less than 180 mg/dL (1-2 hours)      Critically ill patients:  140 - 180 mg/dL   Lab Results  Component Value Date   GLUCAP 195 (H) 08/15/2021   HGBA1C 6.3 (H) 08/02/2021    Review of Glycemic Control Results for MENUCHA, DICESARE (MRN [id number]) as of 08/16/2021 11:12  Ref. Range 08/16/2021 09:21  Glucose Latest Ref Range: 70 - 99 mg/dL 329 (H)   Diabetes history: None  Current orders for Inpatient glycemic control:  Novolog 0-15 units tid + hs  A1c 6.3% on 8/30 Solumedrol 40 mg Q6 hours  Inpatient Diabetes Program Recommendations:    - Consider Levemir 10 units  Thanks,  Tama Headings RN, MSN, BC-ADM Inpatient Diabetes Coordinator Team Pager [phone] (8a-5p)

## 2021-08-16 NOTE — Evaluation (Signed)
Objective Swallowing Evaluation: Type of Study: MBS-Modified Barium Swallow Study   Patient Details  Name: Jessica Erickson MRN: [id number] Date of Birth: 12-12-1925  Today's Date: 08/16/2021 Time: SLP Start Time (ACUTE ONLY): 0805 -SLP Stop Time (ACUTE ONLY): 0915  SLP Time Calculation (min) (ACUTE ONLY): 70 min   Past Medical History:  Past Medical History:  Diagnosis Date   Acoustic neuroma ([place])    Allergy    Asthma    Bilateral swelling of feet    and legs   Bladder infection    CAD (coronary artery disease)    Cataract    Change in voice    Compression fracture of body of thoracic vertebra ([place])    T12 09/18/15 MRI s/p fall    Constipation    COPD (chronic obstructive pulmonary disease) ([place])    previous CXR with chronic interstitial lung dz    CVA (cerebral vascular accident) ([place])    Depression    Diabetes ([place])    with neuropathy   Diabetes mellitus, type 2 ([place])    Diarrhea    Double vision    DVT (deep venous thrombosis) ([place])    right leg 10/2015 was on coumadin off as of 2017/2018 ; s/p IVC filter   Enuresis    Eye pain, right    Fall    Fatty liver    09/15/15 also mildly dilated pancreatitic duct rec MRCP small sub cm cyst hemangioma speeln mild right hydronephrorossi and prox. hydroureter, kidney stones, mild scarring kidneys   Female stress incontinence    Flank pain    GERD (gastroesophageal reflux disease)    with small hiatal hernia    Hard of hearing    Heart disease    History of kidney problems    Hyperlipidemia    mixed   Hypertension    Hypothyroidism, postsurgical    Impaired mobility and ADLs    uses rolling walker has caretaker 24/7 at home   Leg edema    Mixed incontinence urge and stress (female)(female)    Neuropathy    Osteoarthritis    DDD spine    Osteoporosis with fracture    T12 compression fracture   Photophobia    Pulmonary embolism ([place])    10/2015 off coumadin as of 04/2016   Pulmonary HTN ([facility])    mild pulm HTN,  echo 10/09/15 EF 85-63%JSHFW 1 dd, RV systolic pressure increased    Recurrent UTI    Sinus pressure    Skin cancer    BCC jawline and scalp    Thyroid disease    follows [facility]   TIA (transient ischemic attack)    MRI 2009/2010 neg stroke    Trigeminal neuralgia    Dr. Tomi Bamberger s/p gamma knife x 2, on Tegretol since 2011/2012 no increase in dose >200 mg bid rec per family per neurology    Urinary frequency    Urinary, incontinence, stress female    Dr Erlene Quan urology    Past Surgical History:  Past Surgical History:  Procedure Laterality Date   APPENDECTOMY     as a child, open   BRAIN SURGERY     schwnnoma removal 1996    brain tumor surgery     BREAST SURGERY     breast bx   CATARACT EXTRACTION     CHOLECYSTECTOMY     EYE SURGERY     cataract   IVC FILTER PLACEMENT ([place] [facility]X)     Dr. Lucky Cowboy 10/2015  LAPAROSCOPIC TUBAL LIGATION     MOHS SURGERY     scalp 04/2014    PERIPHERAL VASCULAR CATHETERIZATION N/A 10/11/2015   Procedure: IVC Filter Insertion;  Surgeon: Algernon Huxley, MD;  Location: [facility];  Service: Cardiovascular;  Laterality: N/A;   PERIPHERAL VASCULAR THROMBECTOMY Bilateral 03/29/2018   Procedure: PERIPHERAL VASCULAR THROMBECTOMY;  Surgeon: Algernon Huxley, MD;  Location: [facility];  Service: Cardiovascular;  Laterality: Bilateral;   PUBOVAGINAL SLING     THROAT SURGERY     THYROID SURGERY     tumor around vocal cords    TOOTH EXTRACTION     winter 2018    TOTAL THYROIDECTOMY  1976   HPI: Pt is a 87 y.o. female with medical history significant for severe bilateral hearing loss - Deaf, COPD, history of CVA with no residual deficits, hypothyroid, history of DVT on Eliquis, Obesity, chronic bibasilar pulmonary scarring, CKD 3A, chronic respiratory failure requiring 2 L nasal cannula oxygen supplementation, presents to the emergency department for chief concerns of not feeling well.  She initially was less responsive at admit but has since  improved and engages w/ others appropriately w/ alertness/awareness.  She responds verbally to written questions and follows instructions.  Pt has significant Pulmonary history s/p extensive smoking history per Sons; COPD.   Subjective: pt awake, followed commands. verbal to written questions. Pt is Deaf.    Assessment / Plan / Recommendation  CHL IP CLINICAL IMPRESSIONS 08/16/2021  Clinical Impression Pt appeasrs to present w/ Mild-Mod oropharyngeal phase dysphagia w/ increased risk for aspiration thus Pulmonary impact. When using conservative swallowing strategies during po trials of thin liquids (VIA CUP) as well as food consistencies, she lessens risk for aspiration. Swallowing strategies include: SMALL, SINGLE, sips of thin liquids Via Cup SLOWLY; strong Throat Clear/Cough after swallow of thin liquids. Pt would benefit from using a Dysphagia Drink Cup such as the Rije or Provale cups -- this was discussed w/ pt's Son after the MBSS.     During the oral phase, min+ decreased bolus control moreso w/ thin liquids w/ quick A-P transfer and spillage of thin liquid boluses immediately into the pharynx.  During the pharyngeal phase, delayed pharyngeal swallow initiation moreso w/ thin liquid trials resulting into the thin liquid boluses spilling/dropping into the laryngeal vestibule(deep, contacting the vocal cords) and the pharynx PRIOR to initiation of pharyngeal swallow initiation. W/ completion of the swallow, the majority of the thin liquid consistency that had Penetrated into the laryngeal vestibule appeared to clear the laryngeal vestibule. Pt was instructed to use a conservative strategy of Throat Clear/Cough in order to more aggressively clear the laryngeal vestibule of bolus residue. Aspiration of thin liquid was noted x1 during this study w/ pt responding w/ a mild Cough.  More timely pharyngeal swallow initation noted w/ Nectar liquids and food consistencies.  Of Note, when pt utilized SMALL,  SINGLE sips SLOWLY, she demonstrated improved bolus control of thin liquids through the oropharynx w/ Less laryngeal Penetration into the laryngeal vestibule.  SLP Visit Diagnosis Dysphagia, oropharyngeal phase (R13.12)  Attention and concentration deficit following --  Frontal lobe and executive function deficit following --  Impact on safety and function Mild aspiration risk;Moderate aspiration risk;Risk for inadequate nutrition/hydration      CHL IP TREATMENT RECOMMENDATION 08/16/2021  Treatment Recommendations Therapy as outlined in treatment plan below     Prognosis 08/16/2021  Prognosis for Safe Diet Advancement Fair  Barriers to Reach Goals Time post onset;Severity of  deficits  Barriers/Prognosis Comment --    CHL IP DIET RECOMMENDATION 08/16/2021  SLP Diet Recommendations Dysphagia 3 (Mech soft) solids;Thin liquid  Liquid Administration via Cup;No straw  Medication Administration Whole meds with puree  Compensations Minimize environmental distractions;Slow rate;Small sips/bites;Lingual sweep for clearance of pocketing;Follow solids with liquid;Clear throat after each swallow;Hard cough after swallow  Postural Changes Remain semi-upright after after feeds/meals (Comment);Seated upright at 90 degrees      CHL IP OTHER RECOMMENDATIONS 08/16/2021  Recommended Consults (No Data)  Oral Care Recommendations Oral care BID;Oral care before and after PO;Staff/trained caregiver to provide oral care  Other Recommendations (No Data)      CHL IP FOLLOW UP RECOMMENDATIONS 08/16/2021  Follow up Recommendations (No Data)      CHL IP FREQUENCY AND DURATION 08/16/2021  Speech Therapy Frequency (ACUTE ONLY) min 2x/week  Treatment Duration 1 week           CHL IP ORAL PHASE 08/16/2021  Oral Phase Impaired  Oral - Pudding Teaspoon --  Oral - Pudding Cup --  Oral - Honey Teaspoon --  Oral - Honey Cup --  Oral - Nectar Teaspoon --  Oral - Nectar Cup --  Oral - Nectar Straw --  Oral - Thin  Teaspoon --  Oral - Thin Cup --  Oral - Thin Straw --  Oral - Puree --  Oral - Mech Soft --  Oral - Regular --  Oral - Multi-Consistency --  Oral - Pill --  Oral Phase - Comment min+ decreased bolus control moreso w/ thin liquids w/ quick A-P transfer of thin liquid boluses immediately into the pharynx    CHL IP PHARYNGEAL PHASE 08/16/2021  Pharyngeal Phase Impaired  Pharyngeal- Pudding Teaspoon --  Pharyngeal --  Pharyngeal- Pudding Cup --  Pharyngeal --  Pharyngeal- Honey Teaspoon --  Pharyngeal --  Pharyngeal- Honey Cup --  Pharyngeal --  Pharyngeal- Nectar Teaspoon --  Pharyngeal --  Pharyngeal- Nectar Cup --  Pharyngeal --  Pharyngeal- Nectar Straw --  Pharyngeal --  Pharyngeal- Thin Teaspoon --  Pharyngeal --  Pharyngeal- Thin Cup --  Pharyngeal --  Pharyngeal- Thin Straw --  Pharyngeal --  Pharyngeal- Puree --  Pharyngeal --  Pharyngeal- Mechanical Soft --  Pharyngeal --  Pharyngeal- Regular --  Pharyngeal --  Pharyngeal- Multi-consistency --  Pharyngeal --  Pharyngeal- Pill --  Pharyngeal --  Pharyngeal Comment delayed pharyngeal swallow initiation moreso w/ thin liquid trials resulting into the thin liquid boluses spilling/dropping into the laryngeal vestibule(deep, contacting the vocal cords) and the pharynx PRIOR to initiation of pharyngeal swallow initiation. W/ completion of the swallow, the majority of the thin liquid consistency that had Penetrated into the laryngeal vestibule appeared to clear the laryngeal vestibule. Pt was instructed to use a conservative strategy of Throat Clear/Cough in order to more aggressively clear the laryngeal vestibule of bolus residue. Aspiration of thin liquid was noted x1 during this study w/ pt responding w/ a mild Cough.  More timely pharyngeal swallow initation noted w/ Nectar liquids and food consistencies.  Of Note, when pt utilized SMALL, SINGLE sips SLOWLY, she demonstrated improved bolus control of thin liquids through  the oropharynx w/ less laryngeal Penetration into the laryngeal vestibule.     CHL IP CERVICAL ESOPHAGEAL PHASE 08/16/2021  Cervical Esophageal Phase WFL  Pudding Teaspoon --  Pudding Cup --  Honey Teaspoon --  Honey Cup --  Nectar Teaspoon --  Nectar Cup --  Nectar Straw --  Thin Teaspoon --  Thin Cup --  Thin Straw --  Puree --  Mechanical Soft --  Regular --  Multi-consistency --  Pill --  Cervical Esophageal Comment --         Orinda Kenner, MS, CCC-SLP Speech Language Pathologist Rehab Services [phone] [facility] 08/16/2021, 4:22 PM

## 2021-08-16 NOTE — Telephone Encounter (Signed)
Confirmed faxed signed orders to [facility] in [place]. Form has been sent to scan.

## 2021-08-17 LAB — BASIC METABOLIC PANEL
Anion gap: 7 (ref 5–15)
BUN: 46 mg/dL — ABNORMAL HIGH (ref 8–23)
CO2: 31 mmol/L (ref 22–32)
Calcium: 8 mg/dL — ABNORMAL LOW (ref 8.9–10.3)
Chloride: 102 mmol/L (ref 98–111)
Creatinine, Ser: 1.36 mg/dL — ABNORMAL HIGH (ref 0.44–1.00)
GFR, Estimated: 36 mL/min — ABNORMAL LOW (ref >60)
Glucose, Bld: 295 mg/dL — ABNORMAL HIGH (ref 70–99)
Potassium: 4.2 mmol/L (ref 3.5–5.1)
Sodium: 140 mmol/L (ref 135–145)

## 2021-08-17 LAB — CBC WITH DIFFERENTIAL/PLATELET
Abs Immature Granulocytes: 0.08 10*3/uL — ABNORMAL HIGH (ref 0.00–0.07)
Basophils Absolute: 0 10*3/uL (ref 0.0–0.1)
Basophils Relative: 0 %
Eosinophils Absolute: 0 10*3/uL (ref 0.0–0.5)
Eosinophils Relative: 0 %
HCT: 38 % (ref 36.0–46.0)
Hemoglobin: 13 g/dL (ref 12.0–15.0)
Immature Granulocytes: 1 %
Lymphocytes Relative: 4 %
Lymphs Abs: 0.5 10*3/uL — ABNORMAL LOW (ref 0.7–4.0)
MCH: 31.7 pg (ref 26.0–34.0)
MCHC: 34.2 g/dL (ref 30.0–36.0)
MCV: 92.7 fL (ref 80.0–100.0)
Monocytes Absolute: 0.3 10*3/uL (ref 0.1–1.0)
Monocytes Relative: 3 %
Neutro Abs: 9.9 10*3/uL — ABNORMAL HIGH (ref 1.7–7.7)
Neutrophils Relative %: 92 %
Platelets: 160 10*3/uL (ref 150–400)
RBC: 4.1 MIL/uL (ref 3.87–5.11)
RDW: 14.2 % (ref 11.5–15.5)
WBC: 10.7 10*3/uL — ABNORMAL HIGH (ref 4.0–10.5)
nRBC: 0 % (ref 0.0–0.2)

## 2021-08-17 LAB — MAGNESIUM: Magnesium: 2.6 mg/dL — ABNORMAL HIGH (ref 1.7–2.4)

## 2021-08-17 LAB — PHOSPHORUS: Phosphorus: 3.4 mg/dL (ref 2.5–4.6)

## 2021-08-17 LAB — GLUCOSE, CAPILLARY
Glucose-Capillary: 241 mg/dL — ABNORMAL HIGH (ref 70–99)
Glucose-Capillary: 296 mg/dL — ABNORMAL HIGH (ref 70–99)
Glucose-Capillary: 350 mg/dL — ABNORMAL HIGH (ref 70–99)
Glucose-Capillary: 205 mg/dL — ABNORMAL HIGH (ref 70–99)

## 2021-08-17 MED ORDER — METHYLPREDNISOLONE SODIUM SUCC 125 MG IJ SOLR
80.0000 mg | INTRAMUSCULAR | Status: DC
Start: 2021-08-19 — End: 2021-08-18

## 2021-08-17 MED ORDER — METHYLPREDNISOLONE SODIUM SUCC 125 MG IJ SOLR
120.0000 mg | INTRAMUSCULAR | Status: AC
Start: 2021-08-18 — End: 2021-08-18
  Administered 2021-08-18: 13:00:00 120 mg via INTRAVENOUS
  Filled 2021-08-17: qty 2

## 2021-08-17 NOTE — Progress Notes (Signed)
Pt refused cpap this shift, pt left in [place].

## 2021-08-17 NOTE — Progress Notes (Signed)
Patient ID: Jessica Erickson, female   DOB: January 11, 1926, 87 y.o.   MRN: [id number]  PROGRESS NOTE    Jessica Erickson  LGX:[id number] DOB: 1926/03/04 DOA: 08/13/2021 PCP: McLean-Scocuzza, Nino Glow, MD   Brief Narrative:  87 y.o. female with medical history significant for severe bilateral hearing loss, COPD, history of CVA with no residual deficits, hypothyroid, history of DVT on Eliquis, chronic bibasilar pulmonary scarring, CKD 3A, chronic respiratory failure requiring 2 L nasal cannula oxygen supplementation presented with not feeling well.  On presentation, she was on 3.5 L oxygen by nasal cannula.  WBC of 23.6; troponin was 17; lactic acid was within normal limits.  Chest x-ray in the ED showed pulmonary vascular congestion.  COVID 19 and influenza testing were negative.  She was given IV Lasix in the ED. she was subsequently also started on IV Solu-Medrol and antibiotics.  Palliative care was consulted for goals of care discussion.  Assessment & Plan:   Acute on chronic hypoxic respiratory failure COPD exacerbation Pulmonary edema/acute on chronic diastolic heart failure Possible aspiration pneumonia: Present on admission -Patient normally uses 2 L oxygen via nasal cannula; presented requiring 3.5 L oxygen by nasal cannula.   -Currently still on 5 L oxygen via nasal cannula. -Chest x-ray on presentation showed pulmonary congestion.  -Patient has received 2 doses of IV Lasix so far during this hospitalization and not on scheduled Lasix because of low blood pressure.  Blood pressure low this morning: If blood pressure improves, will give 1 more dose of IV Lasix. -Continue nebs.   Continue Solu-Medrol, patient was on 40 mg IV every 6 hourly, decreased to every 8 hourly and then every 12 hourly on 08/18/2021, will transition to oral prednisone gradually as per improvement.   -CT of the chest showed dependent consolidation in lower lobes, fever aspiration/infection versus atelectasis.   Started  Rocephin and Flagyl on 08/15/2021. Leukocytosis due to steroids, continue to trend   Trigeminal neuralgia -Continue gabapentin, at a reduced dose due to renal function.  Used to be on Tegretol but this has been already discontinued by her neurologist due to medication incompatibility with her current blood thinners  OSA -Continue CPAP nightly  Severe bilateral hearing loss, continue supportive care  Hypothyroidism -Continue levothyroxine  History of DVT -Continue Eliquis  Hyperlipidemia -Continue statin  Anxiety/depression -Continue mirtazapine  Chronic kidney disease stage IIIa -Creatinine stable.  Monitor  History of CVA -Currently stable  Generalized deconditioning -Overall prognosis is guarded to poor.   -Palliative care following.  DVT prophylaxis: Eliquis Code Status: DNR/DNI Family Communication: Spoke to son/Tony at bedside on 08/17/2021   Disposition Plan: Status is: Inpatient  Remains inpatient appropriate because:Inpatient level of care appropriate due to severity of illness  Dispo: The patient is from: SNF              Anticipated d/c is to: SNF              Patient currently is not medically stable to d/c.  Most likely in 1 to 2 days, tapering IV Solu-Medrol and oxygen has to wean down to 2 to 3 L which is baseline, currently patient is on 5 L oxygen via nasal cannula   Difficult to place patient No   Consultants: palliative care  Procedures: None  Antimicrobials: Rocephin and Flagyl from 08/15/2021 onwards  Subjective: Patient seen and examined at bedside.  Very hard of hearing.  Son present at bedside.  Still has mild shortness of breath and mild wheezing,  feels improvement.  Denies any fever, no chest pain or palpitations, no nausea vomiting or diarrhea.    Objective: Vitals:   08/16/21 1948 08/16/21 2012 08/17/21 0438 08/17/21 0728  BP: 108/61  137/71 (!) 136/58  Pulse: 72 67 61 (!) 59  Resp: 17 16 17 20   Temp: 98.2 F (36.8 C)  98 F  (36.7 C) 98.4 F (36.9 C)  TempSrc: Oral   Oral  SpO2: 90% 93% 94% 98%  Weight:      Height:        Intake/Output Summary (Last 24 hours) at 08/17/2021 1338 Last data filed at 08/17/2021 1023 Gross per 24 hour  Intake 590 ml  Output 500 ml  Net 90 ml   Filed Weights   08/13/21 1811  Weight: 94 kg    Examination:  General exam: Elderly female lying in bed.  Currently on 5 L oxygen via nasal cannula.  No acute distress.  Very hard of hearing. Respiratory system: Bilateral decreased breath sounds at bases with crackles, mild wheezing cardiovascular system: S1-S2 heard, rate controlled  gastrointestinal system: Abdomen is mildly distended, soft and nontender.  Normal bowel sounds heard extremities: No cyanosis; bilateral lower extremity edema present  central nervous system: Awake. No focal neurological deficits.  Moving extremities  skin: No obvious petechiae/lesions  psychiatry: Flat affect  Data Reviewed: I have personally reviewed following labs and imaging studies  CBC: Recent Labs  Lab 08/13/21 1332 08/14/21 0439 08/15/21 0424 08/16/21 0921 08/17/21 0304  WBC 23.6* 24.0* 17.0* 15.3* 10.7*  NEUTROABS  --   --  16.1* 14.6* 9.9*  HGB 13.9 13.5 13.0 13.0 13.0  HCT 42.0 40.7 38.5 40.0 38.0  MCV 94.4 94.2 93.9 93.2 92.7  PLT 192 192 159 189 387   Basic Metabolic Panel: Recent Labs  Lab 08/13/21 1332 08/14/21 0439 08/15/21 0424 08/16/21 0921 08/17/21 0304  NA 138 140 138 137 140  K 4.0 3.9 4.0 3.8 4.2  CL 104 101 99 98 102  CO2 27 30 31 30 31   GLUCOSE 147* 129* 246* 329* 295*  BUN 19 23 31* 42* 46*  CREATININE 1.04* 1.26* 1.46* 1.41* 1.36*  CALCIUM 7.0* 8.2* 8.1* 8.1* 8.0*  MG  --  2.1 2.2 1.6* 2.6*  PHOS  --  4.5  --   --  3.4   GFR: Estimated Creatinine Clearance: 30.4 mL/min (A) (by C-G formula based on SCr of 1.36 mg/dL (H)). Liver Function Tests: Recent Labs  Lab 08/13/21 1332 08/15/21 0424  AST 32 13*  ALT 16 16  ALKPHOS 59 70  BILITOT 1.6*  0.9  PROT 5.1* 6.0*  ALBUMIN 2.6* 2.7*   No results for input(s): LIPASE, AMYLASE in the last 168 hours. No results for input(s): AMMONIA in the last 168 hours. Coagulation Profile: No results for input(s): INR, PROTIME in the last 168 hours. Cardiac Enzymes: No results for input(s): CKTOTAL, CKMB, CKMBINDEX, TROPONINI in the last 168 hours. BNP (last 3 results) No results for input(s): PROBNP in the last 8760 hours. HbA1C: No results for input(s): HGBA1C in the last 72 hours. CBG: Recent Labs  Lab 08/16/21 1114 08/16/21 1632 08/16/21 1952 08/17/21 0733 08/17/21 1141  GLUCAP 342* 309* 296* 241* 296*   Lipid Profile: No results for input(s): CHOL, HDL, LDLCALC, TRIG, CHOLHDL, LDLDIRECT in the last 72 hours. Thyroid Function Tests: No results for input(s): TSH, T4TOTAL, FREET4, T3FREE, THYROIDAB in the last 72 hours. Anemia Panel: No results for input(s): VITAMINB12, FOLATE, FERRITIN, TIBC, IRON, RETICCTPCT  in the last 72 hours. Sepsis Labs: Recent Labs  Lab 08/13/21 1332 08/13/21 1654  PROCALCITON 0.13  --   LATICACIDVEN 1.5 1.0    Recent Results (from the past 240 hour(s))  Blood culture (single)     Status: None (Preliminary result)   Collection Time: 08/13/21  1:32 PM   Specimen: BLOOD  Result Value Ref Range Status   Specimen Description BLOOD RIGHT ANTECUBITAL  Final   Special Requests   Final    BOTTLES DRAWN AEROBIC AND ANAEROBIC Blood Culture results may not be optimal due to an inadequate volume of blood received in culture bottles   Culture   Final    NO GROWTH 4 DAYS Performed at [facility], [address], [place], [place] [address]    Report Status PENDING  Incomplete  Resp Panel by RT-PCR (Flu A&B, Covid) Nasopharyngeal Swab     Status: None   Collection Time: 08/13/21  2:06 PM   Specimen: Nasopharyngeal Swab; Nasopharyngeal(NP) swabs in vial transport medium  Result Value Ref Range Status   SARS Coronavirus 2 by RT PCR NEGATIVE  NEGATIVE Final    Comment: (NOTE) SARS-CoV-2 target nucleic acids are NOT DETECTED.  The SARS-CoV-2 RNA is generally detectable in upper respiratory specimens during the acute phase of infection. The lowest concentration of SARS-CoV-2 viral copies this assay can detect is 138 copies/mL. A negative result does not preclude SARS-Cov-2 infection and should not be used as the sole basis for treatment or other patient management decisions. A negative result may occur with  improper specimen collection/handling, submission of specimen other than nasopharyngeal swab, presence of viral mutation(s) within the areas targeted by this assay, and inadequate number of viral copies(<138 copies/mL). A negative result must be combined with clinical observations, patient history, and epidemiological information. The expected result is Negative.  Fact Sheet for Patients:  EntrepreneurPulse.com.au  Fact Sheet for Healthcare Providers:  IncredibleEmployment.be  This test is no t yet approved or cleared by the [place] FDA and  has been authorized for detection and/or diagnosis of SARS-CoV-2 by FDA under an Emergency Use Authorization (EUA). This EUA will remain  in effect (meaning this test can be used) for the duration of the COVID-19 declaration under Section 564(b)(1) of the Act, 21 U.S.C.section 360bbb-3(b)(1), unless the authorization is terminated  or revoked sooner.       Influenza A by PCR NEGATIVE NEGATIVE Final   Influenza B by PCR NEGATIVE NEGATIVE Final    Comment: (NOTE) The Xpert Xpress SARS-CoV-2/FLU/RSV plus assay is intended as an aid in the diagnosis of influenza from Nasopharyngeal swab specimens and should not be used as a sole basis for treatment. Nasal washings and aspirates are unacceptable for Xpert Xpress SARS-CoV-2/FLU/RSV testing.  Fact Sheet for Patients: EntrepreneurPulse.com.au  Fact Sheet for Healthcare  Providers: IncredibleEmployment.be  This test is not yet approved or cleared by the [place] FDA and has been authorized for detection and/or diagnosis of SARS-CoV-2 by FDA under an Emergency Use Authorization (EUA). This EUA will remain in effect (meaning this test can be used) for the duration of the COVID-19 declaration under Section 564(b)(1) of the Act, 21 U.S.C. section 360bbb-3(b)(1), unless the authorization is terminated or revoked.  Performed at [facility], [address], [place],  [address]           Radiology Studies: No results found.      Scheduled Meds:  apixaban  2.5 mg Oral BID   atorvastatin  40 mg  Oral q1800   fluticasone furoate-vilanterol  1 puff Inhalation Daily   And   umeclidinium bromide  1 puff Inhalation Daily   gabapentin  200 mg Oral TID   insulin aspart  0-15 Units Subcutaneous TID WC   insulin aspart  0-5 Units Subcutaneous QHS   ipratropium-albuterol  3 mL Nebulization Q6H   levothyroxine  200 mcg Oral Q0600   loratadine  10 mg Oral Daily   melatonin  10 mg Oral QHS   methylPREDNISolone (SOLU-MEDROL) injection  40 mg Intravenous Q6H   mirtazapine  15 mg Oral QHS   multivitamin with minerals  1 tablet Oral Daily   pantoprazole  40 mg Oral Daily   polyethylene glycol  17 g Oral Daily   Continuous Infusions:  cefTRIAXone (ROCEPHIN)  IV Stopped (08/16/21 1008)   metronidazole 500 mg (08/17/21 1246)          Val Riles, MD [facility] 08/17/2021, 1:38 PM

## 2021-08-17 NOTE — Progress Notes (Signed)
Palliative:   Jessica Erickson is lying in bed.  She greets me, making and mostly keeping eye contact.  She appears elderly, overweight and somewhat frail.  She is alert and oriented, able to make her basic needs known.  Her son, [name], is at bedside.  Jessica Erickson communicate via the written word.  She is able to answer verbally.  She has no complaints at this time.  I encouraged her to get up into the chair, and she states that she would like to.  We talked about anticipated discharge to short-term rehab tomorrow.  We talked about speech therapy consult and results/recommendations.  We talked about the importance of mobility.  Conference with attending, bedside nursing staff, transition of care team related to patient condition, needs, goals of care, disposition. [place]/DNR on chart.  Plan: At this point continue to treat the treatable but no CPR or intubation.  Short-term rehab then return home with 24/7 care.  25 minutes Quinn Axe, NP Palliative Medicine Team  Team Phone [phone] Greater than 50% of this time was spent counseling and coordinating care related to the above assessment and plan.

## 2021-08-17 NOTE — Progress Notes (Signed)
Inpatient Diabetes Program Recommendations  AACE/ADA: New Consensus Statement on Inpatient Glycemic Control (2015)  Target Ranges:  Prepandial:   less than 140 mg/dL      [place] postprandial:   less than 180 mg/dL (1-2 hours)      Critically ill patients:  140 - 180 mg/dL   Lab Results  Component Value Date   GLUCAP 241 (H) 08/17/2021   HGBA1C 6.3 (H) 08/02/2021    Review of Glycemic Control Results for AMRIT, ERCK (MRN [id number]) as of 08/17/2021 09:43  Ref. Range 08/15/2021 07:22 08/16/2021 11:14 08/16/2021 16:32 08/16/2021 19:52 08/17/2021 07:33  Glucose-Capillary Latest Ref Range: 70 - 99 mg/dL 195 (H) 342 (H) 309 (H) 296 (H) 241 (H)   Diabetes history: None Current orders for Inpatient glycemic control:  Novolog 0-15 units tid + hs  A1c 6.3% on 8/30 Solumedrol 40 mg Q6 hours  Inpatient Diabetes Program Recommendations:    - Consider Levemir 10 units - Consider adding Novolog 4 units tid meal coverage if eating >50% of meals  Thanks,  Tama Headings RN, MSN, BC-ADM Inpatient Diabetes Coordinator Team Pager [phone] (8a-5p)

## 2021-08-17 NOTE — Progress Notes (Addendum)
Speech Language Pathology Treatment: Dysphagia  Patient Details Name: Jessica Erickson MRN: [id number] DOB: 1926-11-11 Today's Date: 08/17/2021 Time: 4098-1191 SLP Time Calculation (min) (ACUTE ONLY): 35 min  Assessment / Plan / Recommendation Clinical Impression  Pt seen for ongoing education re: swallowing strategies and aspiration precautions during oral intake. Pt sitting in chair having finished most of her Lunch meal. Noted Palliative Care is following pt/family for discussions. Pt is d/t D/C back to her facility.  Pt is alert, verbally responsive and engaged in communication w/ SLP; written information/communication given d/t pt being deaf. Pt is on [place] O2 at 2-4L; wbc wnl.  Discussed w/, and explained to, pt the importance of the recommended aspiration precautions and swallowing strategies as posted in room; Handouts. Noted the Handouts were not on pt's tray table for her to follow visually. Pt nodded and agreed verbally to the need for following them when shown and placed beside her, especially sitting upright for all oral intake and using the strong throat clear/cough after drinking liquids. Pt was supported behind the back more for full upright sitting. She then fed herself trials of soft solids and thin liquids via Cup. No consistent overt clinical s/s of aspiration were noted during intake, but w/ pt's Baseline wet respirations and vocal quality at times, it was difficult to tell. She did exhibit immediate, strong coughing post sips of thin liquids x1 -- noted larger, quick sip. Pt was heavily educated on following precautions: SMALL, SINGLE, sips SLOWLY and using a strong Throat clear/Cough after the swallow/sip. Pt practiced this and followed through w/ the strategies w/ verbal cues (and presence) from this Clinician. Unsure if pt remembers to follow through w/ strategies when no one is present to given cues.  Respiratory status did not decline further from its Baseline during/after the  meal. Oral phase appeared grossly [facility] for bolus management and timely A-P transfer for swallowing; oral clearing achieved w/ all consistencies.     Pt appears at risk for aspiration thus Pulmonary decline d/t inconsistent follow through w/ aspiration precautions and strategies as per MBSS. When using these, the risk can be reduced.   Recommend continue a Regular/mech soft diet for ease of soft foods w/ gravies added to moisten foods; Thin liquids. Recommend strict aspiration precautions and compensatory strategies w/ all oral intake; Pills Whole in Puree; tray setup and positioning assistance for meals.     HPI HPI: Pt is a 87 y.o. female with medical history significant for severe bilateral hearing loss - Deaf, COPD, history of CVA with no residual deficits, hypothyroid, history of DVT on Eliquis, Obesity, chronic bibasilar pulmonary scarring, CKD 3A, chronic respiratory failure requiring 2 L nasal cannula oxygen supplementation, presents to the emergency department for chief concerns of not feeling well.  She initially was less responsive at admit but has since improved and engages w/ others appropriately w/ alertness/awareness.  She responds verbally to written questions and follows instructions.  Pt has significant Pulmonary history s/p extensive smoking history per Sons; COPD.      SLP Plan  Continue with current plan of care       Recommendations  Diet recommendations: Regular;Thin liquid (mech soft foods) Liquids provided via: Cup;No straw Medication Administration: Whole meds with puree (for safer swallowing) Supervision: Patient able to self feed;Intermittent supervision to cue for compensatory strategies Compensations: Minimize environmental distractions;Slow rate;Small sips/bites;Lingual sweep for clearance of pocketing;Follow solids with liquid;Clear throat after each swallow;Hard cough after swallow Postural Changes and/or Swallow Maneuvers: Upright 30-60 min  after meal;Seated  upright 90 degrees;Out of bed for meals                General recommendations:  (Dietician f/u as needed) Oral Care Recommendations: Oral care BID;Oral care before and after PO;Staff/trained caregiver to provide oral care Follow up Recommendations: None (TBD) SLP Visit Diagnosis: Dysphagia, oropharyngeal phase (R13.12) (advanced age; COPD and Pulmonary decline baseline) Plan: Continue with current plan of care       GO                 Orinda Kenner, MS, [place] Pathologist Rehab Services [phone] [facility] 08/17/2021, 5:41 PM

## 2021-08-17 NOTE — Care Management Important Message (Signed)
Important Message  Patient Details  Name: Jessica Erickson MRN: [id number] Date of Birth: 21-Jul-1926   Medicare Important Message Given:  Yes     Juliann Pulse A Caitrin Pendergraph 08/17/2021, 3:09 PM

## 2021-08-17 NOTE — Progress Notes (Signed)
OT Cancellation Note  Patient Details Name: Jessica Erickson MRN: [id number] DOB: May 19, 1926   Cancelled Treatment:    Reason Eval/Treat Not Completed: Fatigue/lethargy limiting ability to participate Pt politely declines to participate with occupational therapist at this time despite encouragement citing fatigue from having just participated with PT. OT will f/u next available date/time for treatment. Thank you.  Gerrianne Scale, Coleman, OTR/L ascom [phone] 08/17/21, 4:56 PM

## 2021-08-18 LAB — CULTURE, BLOOD (SINGLE): Culture: NO GROWTH

## 2021-08-18 LAB — BASIC METABOLIC PANEL
Anion gap: 9 (ref 5–15)
BUN: 42 mg/dL — ABNORMAL HIGH (ref 8–23)
CO2: 32 mmol/L (ref 22–32)
Calcium: 8 mg/dL — ABNORMAL LOW (ref 8.9–10.3)
Chloride: 100 mmol/L (ref 98–111)
Creatinine, Ser: 1.03 mg/dL — ABNORMAL HIGH (ref 0.44–1.00)
GFR, Estimated: 51 mL/min — ABNORMAL LOW (ref >60)
Glucose, Bld: 148 mg/dL — ABNORMAL HIGH (ref 70–99)
Potassium: 4 mmol/L (ref 3.5–5.1)
Sodium: 141 mmol/L (ref 135–145)

## 2021-08-18 LAB — CBC
HCT: 38.2 % (ref 36.0–46.0)
Hemoglobin: 12.4 g/dL (ref 12.0–15.0)
MCH: 30.2 pg (ref 26.0–34.0)
MCHC: 32.5 g/dL (ref 30.0–36.0)
MCV: 92.9 fL (ref 80.0–100.0)
Platelets: 161 10*3/uL (ref 150–400)
RBC: 4.11 MIL/uL (ref 3.87–5.11)
RDW: 14 % (ref 11.5–15.5)
WBC: 9.9 10*3/uL (ref 4.0–10.5)
nRBC: 0 % (ref 0.0–0.2)

## 2021-08-18 LAB — GLUCOSE, CAPILLARY
Glucose-Capillary: 171 mg/dL — ABNORMAL HIGH (ref 70–99)
Glucose-Capillary: 105 mg/dL — ABNORMAL HIGH (ref 70–99)
Glucose-Capillary: 380 mg/dL — ABNORMAL HIGH (ref 70–99)
Glucose-Capillary: 319 mg/dL — ABNORMAL HIGH (ref 70–99)

## 2021-08-18 LAB — PHOSPHORUS: Phosphorus: 2.9 mg/dL (ref 2.5–4.6)

## 2021-08-18 LAB — MAGNESIUM: Magnesium: 2.2 mg/dL (ref 1.7–2.4)

## 2021-08-18 MED ORDER — SODIUM CHLORIDE 0.9 % IV SOLN
INTRAVENOUS | Status: DC | PRN
  Administered 2021-08-18: 250 mL via INTRAVENOUS

## 2021-08-18 MED ORDER — PREDNISONE 20 MG PO TABS: 30.0000 mg | ORAL_TABLET | Freq: Every day | ORAL | Status: DC

## 2021-08-18 MED ORDER — PREDNISONE 20 MG PO TABS: 20.0000 mg | ORAL_TABLET | Freq: Every day | ORAL | Status: DC

## 2021-08-18 MED ORDER — METHYLPREDNISOLONE SODIUM SUCC 125 MG IJ SOLR
80.0000 mg | Freq: Once | INTRAMUSCULAR | Status: AC
  Administered 2021-08-19: 80 mg via INTRAVENOUS
  Filled 2021-08-18: qty 2

## 2021-08-18 MED ORDER — PREDNISONE 20 MG PO TABS: 40.0000 mg | ORAL_TABLET | Freq: Every day | ORAL | Status: DC

## 2021-08-18 MED ORDER — PREDNISONE 10 MG PO TABS: 10.0000 mg | ORAL_TABLET | Freq: Every day | ORAL | Status: DC

## 2021-08-18 MED ORDER — IPRATROPIUM-ALBUTEROL 0.5-2.5 (3) MG/3ML IN SOLN
3.0000 mL | Freq: Three times a day (TID) | RESPIRATORY_TRACT | Status: DC
  Administered 2021-08-18 – 2021-08-19 (×4): 3 mL via RESPIRATORY_TRACT
  Filled 2021-08-18 (×4): qty 3

## 2021-08-18 NOTE — Progress Notes (Signed)
Inpatient Diabetes Program Recommendations  AACE/ADA: New Consensus Statement on Inpatient Glycemic Control (2015)  Target Ranges:  Prepandial:   less than 140 mg/dL      Peak postprandial:   less than 180 mg/dL (1-2 hours)      Critically ill patients:  140 - 180 mg/dL   Lab Results  Component Value Date   GLUCAP 171 (H) 08/18/2021   HGBA1C 6.3 (H) 08/02/2021    Review of Glycemic Control RResults for SEDRA, MORFIN (MRN [id number]) as of 08/18/2021 10:40  Ref. Range 08/17/2021 07:33 08/17/2021 11:41 08/17/2021 17:42 08/17/2021 20:51 08/18/2021 08:43  Glucose-Capillary Latest Ref Range: 70 - 99 mg/dL 241 (H) 296 (H) 350 (H) 205 (H) 171 (H)    Diabetes history: None Current orders for Inpatient glycemic control:  Novolog 0-15 units tid + hs  A1c 6.3% on 8/30 Ordered: Solumedrol 120 mg Q24 hours, then Solumedrol 80 mg Q24 hours starting 9/16  Inpatient Diabetes Program Recommendations:    - consider Levemir 5 units - Consider adding Novolog 4 units tid meal coverage if eating >50% of meals  Thanks,  Tama Headings RN, MSN, BC-ADM Inpatient Diabetes Coordinator Team Pager [phone] (8a-5p)

## 2021-08-18 NOTE — TOC Progression Note (Signed)
Transition of Care [facility]) - Progression Note    Patient Details  Name: Jessica Erickson MRN: [id number] Date of Birth: 01-12-1926  [facility] CM/SW Contact  Shelbie Hutching, RN Phone Number: 08/18/2021, 3:32 PM  Clinical Narrative:    Insurance authorization has been approved for [employer] and is good through 9/19.  Nursing will attempt to wean oxygen over night.     Expected Discharge Plan: [place] Barriers to Discharge: Continued Medical Work up  Expected Discharge Plan and Services Expected Discharge Plan: [place]   Discharge Planning Services: CM Consult Post Acute Care Choice: [place] Living arrangements for the past 2 months: Single Family Home                 DME Arranged: N/A DME Agency: NA       HH Arranged: NA HH Agency: NA         Social Determinants of Health (SDOH) Interventions    Readmission Risk Interventions Readmission Risk Prevention Plan 08/15/2021  Transportation Screening Complete  Medication Review Press photographer) Complete  PCP or Specialist appointment within 3-5 days of discharge Complete  HRI or Home Care Consult Complete  SW Recovery Care/Counseling Consult Complete  Palliative Care Screening Complete  Skilled Nursing Facility Complete  Some recent data might be hidden

## 2021-08-18 NOTE — Progress Notes (Signed)
Patient ID: Jessica Erickson, female   DOB: 1926/02/14, 87 y.o.   MRN: [id number]  PROGRESS NOTE    Jessica Erickson  XIP:[id number] DOB: 1926/09/22 DOA: 08/13/2021 PCP: McLean-Scocuzza, Nino Glow, MD   Brief Narrative:  87 y.o. female with medical history significant for severe bilateral hearing loss, COPD, history of CVA with no residual deficits, hypothyroid, history of DVT on Eliquis, chronic bibasilar pulmonary scarring, CKD 3A, chronic respiratory failure requiring 2 L nasal cannula oxygen supplementation presented with not feeling well.  On presentation, she was on 3.5 L oxygen by nasal cannula.  WBC of 23.6; troponin was 17; lactic acid was within normal limits.  Chest x-ray in the ED showed pulmonary vascular congestion.  COVID 19 and influenza testing were negative.  She was given IV Lasix in the ED. she was subsequently also started on IV Solu-Medrol and antibiotics.  Palliative care was consulted for goals of care discussion.  Assessment & Plan:   Acute on chronic hypoxic respiratory failure COPD exacerbation Pulmonary edema/acute on chronic diastolic heart failure Possible aspiration pneumonia: Present on admission -Patient normally uses 2 L oxygen via nasal cannula; presented requiring 3.5 L oxygen by nasal cannula.   -Currently still on 5 L oxygen via nasal cannula. -Chest x-ray on presentation showed pulmonary congestion.  -Patient has received 2 doses of IV Lasix so far during this hospitalization and not on scheduled Lasix because of low blood pressure.  Blood pressure low this morning: If blood pressure improves, will give 1 more dose of IV Lasix. -Continue nebs.   Continue Solu-Medrol, patient was on 40 mg IV every 6 hourly, decreased to every 8 hourly and then every 12 hourly on 08/18/2021, will transition to oral prednisone gradually as per improvement.  9/15 pharmacy changed IV Solu-Medrol dosing according to their protocol so patient received IV Solu-Medrol 120 mg every 24 hours 1  dose and we will give IV Solu-Medrol 80 mg tomorrow a.m., then we will start oral prednisone tapering dose -CT of the chest showed dependent consolidation in lower lobes, fever aspiration/infection versus atelectasis.   Started Rocephin and Flagyl on 08/15/2021. Leukocytosis due to steroids, continue to trend   Trigeminal neuralgia -Continue gabapentin, at a reduced dose due to renal function.  Used to be on Tegretol but this has been already discontinued by her neurologist due to medication incompatibility with her current blood thinners  OSA -Continue CPAP nightly  Severe bilateral hearing loss, continue supportive care  Hypothyroidism -Continue levothyroxine  History of DVT -Continue Eliquis  Hyperlipidemia -Continue statin  Anxiety/depression -Continue mirtazapine  Chronic kidney disease stage IIIa -Creatinine stable.  Monitor  History of CVA -Currently stable  Generalized deconditioning -Overall prognosis is guarded to poor.   -Palliative care following.  DVT prophylaxis: Eliquis Code Status: DNR/DNI Family Communication: Spoke to son/Jessica Erickson at bedside on 08/17/2021   Disposition Plan: Status is: Inpatient  Remains inpatient appropriate because:Inpatient level of care appropriate due to severity of illness  Dispo: The patient is from: SNF              Anticipated d/c is to: SNF              Patient currently is not medically stable to d/c.  Most likely in 1 to 2 days, tapering IV Solu-Medrol and oxygen has to wean down to 2 to 3 L which is baseline, currently patient is on 5 L oxygen via nasal cannula   Difficult to place patient No   Consultants: palliative care  Procedures: None  Antimicrobials: Rocephin and Flagyl from 08/15/2021 onwards  Subjective: Patient seen and examined at bedside.  Very hard of hearing.  still has mild shortness of breath and mild wheezing, feels improvement.  Denies any fever, no chest pain or palpitations, no nausea vomiting or  diarrhea.   I wrote it down on the piece of paper that patient is still on 5 L oxygen so we need to wean oxygen down, probably she needs to stay for 1 to 2 days more.   Objective: Vitals:   08/18/21 0633 08/18/21 0846 08/18/21 1204 08/18/21 1438  BP: (!) 118/55 (!) 105/47 (!) 89/49   Pulse: (!) 57 66 63   Resp: 15 18 18    Temp: (!) 97.5 F (36.4 C) 98.4 F (36.9 C) 98 F (36.7 C)   TempSrc: Oral     SpO2: 96% 96% 96% 92%  Weight:      Height:        Intake/Output Summary (Last 24 hours) at 08/18/2021 1439 Last data filed at 08/18/2021 0028 Gross per 24 hour  Intake 168 ml  Output 2350 ml  Net -2182 ml   Filed Weights   08/13/21 1811  Weight: 94 kg    Examination:  General exam: Elderly female lying in bed.  Currently on 5 L oxygen via nasal cannula.  No acute distress.  Very hard of hearing. Respiratory system: Bilateral decreased breath sounds at bases with crackles, mild wheezing cardiovascular system: S1-S2 heard, rate controlled  gastrointestinal system: Abdomen is mildly distended, soft and nontender.  Normal bowel sounds heard extremities: No cyanosis; bilateral lower extremity edema present  central nervous system: Awake. No focal neurological deficits.  Moving extremities  skin: No obvious petechiae/lesions  psychiatry: Flat affect  Data Reviewed: I have personally reviewed following labs and imaging studies  CBC: Recent Labs  Lab 08/14/21 0439 08/15/21 0424 08/16/21 0921 08/17/21 0304 08/18/21 0344  WBC 24.0* 17.0* 15.3* 10.7* 9.9  NEUTROABS  --  16.1* 14.6* 9.9*  --   HGB 13.5 13.0 13.0 13.0 12.4  HCT 40.7 38.5 40.0 38.0 38.2  MCV 94.2 93.9 93.2 92.7 92.9  PLT 192 159 189 160 128   Basic Metabolic Panel: Recent Labs  Lab 08/14/21 0439 08/15/21 0424 08/16/21 0921 08/17/21 0304 08/18/21 0344  NA 140 138 137 140 141  K 3.9 4.0 3.8 4.2 4.0  CL 101 99 98 102 100  CO2 30 31 30 31  32  GLUCOSE 129* 246* 329* 295* 148*  BUN 23 31* 42* 46* 42*   CREATININE 1.26* 1.46* 1.41* 1.36* 1.03*  CALCIUM 8.2* 8.1* 8.1* 8.0* 8.0*  MG 2.1 2.2 1.6* 2.6* 2.2  PHOS 4.5  --   --  3.4 2.9   GFR: Estimated Creatinine Clearance: 40.2 mL/min (A) (by C-G formula based on SCr of 1.03 mg/dL (H)). Liver Function Tests: Recent Labs  Lab 08/13/21 1332 08/15/21 0424  AST 32 13*  ALT 16 16  ALKPHOS 59 70  BILITOT 1.6* 0.9  PROT 5.1* 6.0*  ALBUMIN 2.6* 2.7*   No results for input(s): LIPASE, AMYLASE in the last 168 hours. No results for input(s): AMMONIA in the last 168 hours. Coagulation Profile: No results for input(s): INR, PROTIME in the last 168 hours. Cardiac Enzymes: No results for input(s): CKTOTAL, CKMB, CKMBINDEX, TROPONINI in the last 168 hours. BNP (last 3 results) No results for input(s): PROBNP in the last 8760 hours. HbA1C: No results for input(s): HGBA1C in the last 72 hours. CBG: Recent  Labs  Lab 08/17/21 1141 08/17/21 1742 08/17/21 2051 08/18/21 0843 08/18/21 1201  GLUCAP 296* 350* 205* 171* 105*   Lipid Profile: No results for input(s): CHOL, HDL, LDLCALC, TRIG, CHOLHDL, LDLDIRECT in the last 72 hours. Thyroid Function Tests: No results for input(s): TSH, T4TOTAL, FREET4, T3FREE, THYROIDAB in the last 72 hours. Anemia Panel: No results for input(s): VITAMINB12, FOLATE, FERRITIN, TIBC, IRON, RETICCTPCT in the last 72 hours. Sepsis Labs: Recent Labs  Lab 08/13/21 1332 08/13/21 1654  PROCALCITON 0.13  --   LATICACIDVEN 1.5 1.0    Recent Results (from the past 240 hour(s))  Blood culture (single)     Status: None   Collection Time: 08/13/21  1:32 PM   Specimen: BLOOD  Result Value Ref Range Status   Specimen Description BLOOD RIGHT ANTECUBITAL  Final   Special Requests   Final    BOTTLES DRAWN AEROBIC AND ANAEROBIC Blood Culture results may not be optimal due to an inadequate volume of blood received in culture bottles   Culture   Final    NO GROWTH 5 DAYS Performed at [facility], [address] [place], [place] [address]    Report Status 08/18/2021 FINAL  Final  Resp Panel by RT-PCR (Flu A&B, Covid) Nasopharyngeal Swab     Status: None   Collection Time: 08/13/21  2:06 PM   Specimen: Nasopharyngeal Swab; Nasopharyngeal(NP) swabs in vial transport medium  Result Value Ref Range Status   SARS Coronavirus 2 by RT PCR NEGATIVE NEGATIVE Final    Comment: (NOTE) SARS-CoV-2 target nucleic acids are NOT DETECTED.  The SARS-CoV-2 RNA is generally detectable in upper respiratory specimens during the acute phase of infection. The lowest concentration of SARS-CoV-2 viral copies this assay can detect is 138 copies/mL. A negative result does not preclude SARS-Cov-2 infection and should not be used as the sole basis for treatment or other patient management decisions. A negative result may occur with  improper specimen collection/handling, submission of specimen other than nasopharyngeal swab, presence of viral mutation(s) within the areas targeted by this assay, and inadequate number of viral copies(<138 copies/mL). A negative result must be combined with clinical observations, patient history, and epidemiological information. The expected result is Negative.  Fact Sheet for Patients:  EntrepreneurPulse.com.au  Fact Sheet for Healthcare Providers:  IncredibleEmployment.be  This test is no t yet approved or cleared by the [place] FDA and  has been authorized for detection and/or diagnosis of SARS-CoV-2 by FDA under an Emergency Use Authorization (EUA). This EUA will remain  in effect (meaning this test can be used) for the duration of the COVID-19 declaration under Section 564(b)(1) of the Act, 21 U.S.C.section 360bbb-3(b)(1), unless the authorization is terminated  or revoked sooner.       Influenza A by PCR NEGATIVE NEGATIVE Final   Influenza B by PCR NEGATIVE NEGATIVE Final    Comment: (NOTE) The Xpert Xpress SARS-CoV-2/FLU/RSV  plus assay is intended as an aid in the diagnosis of influenza from Nasopharyngeal swab specimens and should not be used as a sole basis for treatment. Nasal washings and aspirates are unacceptable for Xpert Xpress SARS-CoV-2/FLU/RSV testing.  Fact Sheet for Patients: EntrepreneurPulse.com.au  Fact Sheet for Healthcare Providers: IncredibleEmployment.be  This test is not yet approved or cleared by the [place] FDA and has been authorized for detection and/or diagnosis of SARS-CoV-2 by FDA under an Emergency Use Authorization (EUA). This EUA will remain in effect (meaning this test can be used) for the duration of the  COVID-19 declaration under Section 564(b)(1) of the Act, 21 U.S.C. section 360bbb-3(b)(1), unless the authorization is terminated or revoked.  Performed at [facility], [address] [place], [place] [address]           Radiology Studies: No results found.      Scheduled Meds:  apixaban  2.5 mg Oral BID   atorvastatin  40 mg Oral q1800   fluticasone furoate-vilanterol  1 puff Inhalation Daily   And   umeclidinium bromide  1 puff Inhalation Daily   gabapentin  200 mg Oral TID   insulin aspart  0-15 Units Subcutaneous TID WC   insulin aspart  0-5 Units Subcutaneous QHS   ipratropium-albuterol  3 mL Nebulization TID   levothyroxine  200 mcg Oral Q0600   loratadine  10 mg Oral Daily   melatonin  10 mg Oral QHS   [START ON 08/19/2021] methylPREDNISolone (SOLU-MEDROL) injection  80 mg Intravenous Once   mirtazapine  15 mg Oral QHS   multivitamin with minerals  1 tablet Oral Daily   pantoprazole  40 mg Oral Daily   polyethylene glycol  17 g Oral Daily   [START ON 08/20/2021] predniSONE  40 mg Oral Q breakfast   Followed by   Derrill Memo ON 08/23/2021] predniSONE  30 mg Oral Q breakfast   Followed by   Derrill Memo ON 08/26/2021] predniSONE  20 mg Oral Q breakfast   Followed by   Derrill Memo ON 08/29/2021] predniSONE   10 mg Oral Q breakfast   Continuous Infusions:  cefTRIAXone (ROCEPHIN)  IV 2 g (08/18/21 1359)   metronidazole 500 mg (08/18/21 1255)          Jessica Riles, MD [facility] 08/18/2021, 2:39 PM

## 2021-08-18 NOTE — Progress Notes (Signed)
Physical Therapy Treatment Patient Details Name: Jessica Erickson MRN: [id number] DOB: 01-Nov-1926 Today's Date: 08/18/2021   History of Present Illness Pt is a 87 y.o. female with medical history significant for severe bilateral hearing loss, COPD, history of CVA with no residual deficits, hypothyroid, history of DVT on Eliquis, chronic bibasilar pulmonary scarring, CKD 3A, chronic respiratory failure requiring 2 L nasal cannula oxygen supplementation, presents to the emergency department for chief concerns of not feeling well.  MD assessment includes: Acute on chronic hypoxic respiratory failure, COPD exacerbation, Pulmonary edema/acute on chronic diastolic heart failure, possible aspiration pneumonia present on admission, leukocytosis, and generalized deconditioning.    PT Comments    Pt was sitting in recliner upon arrival. She is A and pleasant. Is HOH so [name] elected to write all conversations. She requested to urinate on BSC. She stood and took a few steps to [facility] prior to standing and ambulating back to bed. Pt fatigues quickly however all vitals were stable. Assisted with returning to bed and repositioning to [facility]. Pt tolerated session well. Recommend DC to SNF to improve independence with ADLs.   Recommendations for follow up therapy are one component of a multi-disciplinary discharge planning process, led by the attending physician.  Recommendations may be updated based on patient status, additional functional criteria and insurance authorization.  Follow Up Recommendations  SNF     Equipment Recommendations  Other (comment) (defer to next level of care)       Precautions / Restrictions Precautions Precautions: Fall     Mobility  Bed Mobility Overal bed mobility: Needs Assistance Bed Mobility: Sit to Supine     Supine to sit: Supervision     General bed mobility comments: no physical assistance or Vcs to position to [facility] from short sit EOB    Transfers Overall transfer  level: Needs assistance Equipment used: Rolling walker (2 wheeled) Transfers: Sit to/from Stand Sit to Stand: Min guard;From elevated surface         General transfer comment: Pt was easily able to stand 4 x throughout session from recliner/ BSC and 2 x from EOB  Ambulation/Gait Ambulation/Gait assistance: Min guard   Assistive device: Rolling walker (2 wheeled) Gait Pattern/deviations: Step-to pattern;Decreased step length - right;Decreased step length - left;Trunk flexed Gait velocity: decreased   General Gait Details: Pt was able to ambulate 5 ft with RW    Balance Overall balance assessment: Needs assistance Sitting-balance support: No upper extremity supported;Feet supported Sitting balance-Leahy Scale: Good     Standing balance support: Bilateral upper extremity supported;During functional activity Standing balance-Leahy Scale: Fair      Cognition Arousal/Alertness: Awake/alert Behavior During Therapy: WFL for tasks assessed/performed Overall Cognitive Status: Within Functional Limits for tasks assessed        General Comments: pleasant and agreeable             Pertinent Vitals/Pain Faces Pain Scale: No hurt     PT Goals (current goals can now be found in the care plan section) Acute Rehab PT Goals Patient Stated Goal: To get stronger    Frequency    Min 2X/week      PT Plan Current plan remains appropriate       AM-PAC PT "6 Clicks" Mobility   Outcome Measure  Help needed turning from your back to your side while in a flat bed without using bedrails?: A Little Help needed moving from lying on your back to sitting on the side of a flat bed without using bedrails?:  A Little Help needed moving to and from a bed to a chair (including a wheelchair)?: A Little Help needed standing up from a chair using your arms (e.g., wheelchair or bedside chair)?: A Little Help needed to walk in hospital room?: A Little Help needed climbing 3-5 steps with a  railing? : A Little 6 Click Score: (P) 18    End of Session Equipment Utilized During Treatment: Gait belt Activity Tolerance: Patient tolerated treatment well Patient left: in bed;with call bell/phone within reach;with bed alarm set Nurse Communication: Mobility status PT Visit Diagnosis: Muscle weakness (generalized) (M62.81);Unsteadiness on feet (R26.81);Difficulty in walking, not elsewhere classified (R26.2) Pain - Right/Left: Right Pain - part of body: Leg     Time:  -     Charges:                        Julaine Fusi PTA 08/18/21, 8:06 AM

## 2021-08-19 DIAGNOSIS — R531 Weakness: Secondary | ICD-10-CM | POA: Diagnosis not present

## 2021-08-19 DIAGNOSIS — R5381 Other malaise: Secondary | ICD-10-CM | POA: Diagnosis not present

## 2021-08-19 DIAGNOSIS — Z86718 Personal history of other venous thrombosis and embolism: Secondary | ICD-10-CM | POA: Diagnosis not present

## 2021-08-19 DIAGNOSIS — J189 Pneumonia, unspecified organism: Secondary | ICD-10-CM | POA: Diagnosis not present

## 2021-08-19 DIAGNOSIS — J9611 Chronic respiratory failure with hypoxia: Secondary | ICD-10-CM | POA: Diagnosis not present

## 2021-08-19 DIAGNOSIS — I5032 Chronic diastolic (congestive) heart failure: Secondary | ICD-10-CM | POA: Diagnosis not present

## 2021-08-19 DIAGNOSIS — R0602 Shortness of breath: Secondary | ICD-10-CM | POA: Diagnosis not present

## 2021-08-19 DIAGNOSIS — J810 Acute pulmonary edema: Secondary | ICD-10-CM | POA: Diagnosis not present

## 2021-08-19 DIAGNOSIS — J45909 Unspecified asthma, uncomplicated: Secondary | ICD-10-CM | POA: Diagnosis not present

## 2021-08-19 DIAGNOSIS — H9193 Unspecified hearing loss, bilateral: Secondary | ICD-10-CM | POA: Diagnosis not present

## 2021-08-19 DIAGNOSIS — Z9981 Dependence on supplemental oxygen: Secondary | ICD-10-CM | POA: Diagnosis not present

## 2021-08-19 DIAGNOSIS — E1140 Type 2 diabetes mellitus with diabetic neuropathy, unspecified: Secondary | ICD-10-CM | POA: Diagnosis not present

## 2021-08-19 DIAGNOSIS — I119 Hypertensive heart disease without heart failure: Secondary | ICD-10-CM | POA: Diagnosis not present

## 2021-08-19 DIAGNOSIS — J449 Chronic obstructive pulmonary disease, unspecified: Secondary | ICD-10-CM | POA: Diagnosis not present

## 2021-08-19 DIAGNOSIS — J441 Chronic obstructive pulmonary disease with (acute) exacerbation: Secondary | ICD-10-CM | POA: Diagnosis not present

## 2021-08-19 DIAGNOSIS — E1122 Type 2 diabetes mellitus with diabetic chronic kidney disease: Secondary | ICD-10-CM | POA: Diagnosis not present

## 2021-08-19 DIAGNOSIS — Z87891 Personal history of nicotine dependence: Secondary | ICD-10-CM | POA: Diagnosis not present

## 2021-08-19 LAB — CBC
HCT: 37.6 % (ref 36.0–46.0)
Hemoglobin: 12.7 g/dL (ref 12.0–15.0)
MCH: 31.4 pg (ref 26.0–34.0)
MCHC: 33.8 g/dL (ref 30.0–36.0)
MCV: 92.8 fL (ref 80.0–100.0)
Platelets: 158 10*3/uL (ref 150–400)
RBC: 4.05 MIL/uL (ref 3.87–5.11)
RDW: 14.1 % (ref 11.5–15.5)
WBC: 9.2 10*3/uL (ref 4.0–10.5)
nRBC: 0 % (ref 0.0–0.2)

## 2021-08-19 LAB — BASIC METABOLIC PANEL
Anion gap: 5 (ref 5–15)
BUN: 34 mg/dL — ABNORMAL HIGH (ref 8–23)
CO2: 31 mmol/L (ref 22–32)
Calcium: 8 mg/dL — ABNORMAL LOW (ref 8.9–10.3)
Chloride: 104 mmol/L (ref 98–111)
Creatinine, Ser: 0.98 mg/dL (ref 0.44–1.00)
GFR, Estimated: 54 mL/min — ABNORMAL LOW (ref >60)
Glucose, Bld: 192 mg/dL — ABNORMAL HIGH (ref 70–99)
Potassium: 4.2 mmol/L (ref 3.5–5.1)
Sodium: 140 mmol/L (ref 135–145)

## 2021-08-19 LAB — PHOSPHORUS: Phosphorus: 2.9 mg/dL (ref 2.5–4.6)

## 2021-08-19 LAB — MAGNESIUM: Magnesium: 2.2 mg/dL (ref 1.7–2.4)

## 2021-08-19 LAB — GLUCOSE, CAPILLARY
Glucose-Capillary: 187 mg/dL — ABNORMAL HIGH (ref 70–99)
Glucose-Capillary: 208 mg/dL — ABNORMAL HIGH (ref 70–99)

## 2021-08-19 MED ORDER — GABAPENTIN 100 MG PO CAPS: 200.0000 mg | ORAL_CAPSULE | Freq: Three times a day (TID) | ORAL | Status: DC

## 2021-08-19 MED ORDER — PREDNISONE 20 MG PO TABS: 20.0000 mg | ORAL_TABLET | Freq: Every day | ORAL | 0 refills | Status: AC

## 2021-08-19 MED ORDER — PREDNISONE 20 MG PO TABS: 40.0000 mg | ORAL_TABLET | Freq: Every day | ORAL | 0 refills | Status: AC

## 2021-08-19 MED ORDER — METRONIDAZOLE 500 MG PO TABS: 500.0000 mg | ORAL_TABLET | Freq: Three times a day (TID) | ORAL | 0 refills | Status: AC

## 2021-08-19 MED ORDER — PREDNISONE 10 MG PO TABS: 10.0000 mg | ORAL_TABLET | Freq: Every day | ORAL | 0 refills | Status: AC

## 2021-08-19 MED ORDER — CEFDINIR 300 MG PO CAPS: 300.0000 mg | ORAL_CAPSULE | Freq: Two times a day (BID) | ORAL | 0 refills | Status: AC

## 2021-08-19 MED ORDER — PREDNISONE 10 MG PO TABS: 30.0000 mg | ORAL_TABLET | Freq: Every day | ORAL | 0 refills | Status: AC

## 2021-08-19 NOTE — Progress Notes (Signed)
Attempted to call report to [employer], no response. Will attempt again prior to patient discharge.

## 2021-08-19 NOTE — Progress Notes (Signed)
Discharge packet provided to EMS transport. IV acces removed, no issues noted. Patient discharged from facility with all belongings.

## 2021-08-19 NOTE — TOC Progression Note (Signed)
Transition of Care [facility]) - Progression Note    Patient Details  Name: MIRACLE MONGILLO MRN: [id number] Date of Birth: [birth year]  Transition [facility] Care [facility]) CM/SW [place], RN Phone Number: 08/19/2021, 1:13 PM  Clinical Narrative:   Patient will be discharging by EMS at 3pm to [facility].  Per Magda Paganini at facility bed is ready, family aware.    Expected Discharge Plan: [place] Barriers to Discharge: Continued Medical Work up  Expected Discharge Plan and Services Expected Discharge Plan: [place]   Discharge Planning Services: CM Consult Post Acute Care Choice: [place] Living arrangements for the past 2 months: Single Family Home Expected Discharge Date: 08/19/21               DME Arranged: N/A DME Agency: NA       HH Arranged: NA HH Agency: NA         Social Determinants of Health (SDOH) Interventions    Readmission Risk Interventions Readmission Risk Prevention Plan 08/15/2021  Transportation Screening Complete  Medication Review [employer]) Complete  PCP or Specialist appointment within 3-5 days of discharge Complete  HRI or Home Care Consult Complete  SW Recovery Care/Counseling Consult Complete  Palliative Care Screening Complete  Skilled Nursing Facility Complete  Some recent data might be hidden

## 2021-08-19 NOTE — Progress Notes (Signed)
Attempted to call report to facility once more, placed on hold for 21 minutes then hung up and no response when called back to facility.  

## 2021-08-19 NOTE — Discharge Summary (Signed)
[facility] Discharge Summary   Patient: Jessica Erickson YWV:[id number]  PCP: McLean-Scocuzza, Nino Glow, MD  Date of admission: 08/13/2021   Date of discharge:  08/19/2021     Discharge Diagnoses:  Principal diagnosis COPD exacerbation Principal Problem:   Shortness of breath Active Problems:   COPD (chronic obstructive pulmonary disease) (HCC)   Anxiety and depression   Trigeminal neuralgia   GERD (gastroesophageal reflux disease)   Insomnia   Hypothyroid   Physical deconditioning   Hypertension   Hearing loss of both ears   History of DVT (deep vein thrombosis)   Arthritis   Grade I diastolic dysfunction   Obesity (BMI 30-39.9)   Pulmonary artery hypertension (HCC)   CKD (chronic kidney disease), stage III (HCC)   History of CVA (cerebrovascular accident)   Acute on chronic respiratory failure ([place])   Admitted From: SNF Disposition:  Home   Recommendations for Outpatient Follow-up:  PCP: in 1 wk Follow up LABS/TEST:  CXR in 4 wks   Contact information for after-discharge care     [facility] .   Service: Skilled Nursing Contact information: [place] [phone]                    Diet recommendation: Carb modified diet  Activity: The patient is advised to gradually reintroduce usual activities, as tolerated  Discharge Condition: stable  Code Status: DNR   History of present illness: As per the H and P dictated on admission Hospital Course:  87 y.o. female with medical history significant for severe bilateral hearing loss, COPD, history of CVA with no residual deficits, hypothyroid, history of DVT on Eliquis, chronic bibasilar pulmonary scarring, CKD 3A, chronic respiratory failure requiring 2 L nasal cannula oxygen supplementation presented with not feeling well.  On presentation, she was on 3.5 L  oxygen by nasal cannula.  WBC of 23.6; troponin was 17; lactic acid was within normal limits.  Chest x-ray in the ED showed pulmonary vascular congestion.  COVID 19 and influenza testing were negative.  She was given IV Lasix in the ED. she was subsequently also started on IV Solu-Medrol and antibiotics.  Palliative care was consulted for goals of care discussion.  Further management as below   Assessment & Plan: # Acute on chronic hypoxic respiratory failure # COPD exacerbation # Pulmonary edema/acute on chronic diastolic heart failure # Possible aspiration pneumonia: Present on admission -Patient normally uses 2 L oxygen via nasal cannula; and she required 5 to 6 L oxygen during hospital stay which has been weaned down to her baseline.  Chest x-ray on presentation showed pulmonary congestion. Patient did receive 2 doses of IV Lasix so far during this hospitalization and not on scheduled Lasix because of low blood pressure. Continue nebs. S/p Solu-Medrol, patient was on 40 mg IV every 6 hourly, decreased to every 8 hourly and then every 12 hourly on 08/18/2021, planned to transition to oral prednisone gradually as per improvement. 9/15 pharmacy changed IV Solu-Medrol dosing according to their protocol so patient received IV Solu-Medrol 120 mg every 24 hours 1 dose and IV Solu-Medrol 80 mg today, will start oral prednisone tapering dose from tomorrow a.m. 40 mg daily for 3 days, 30 mg daily for 3 days, 20 mg daily for 2 days, 10 mg p.o. daily for 3 days. CT of the chest showed dependent consolidation in lower  lobes, fever aspiration/infection versus atelectasis.  s/p Rocephin and Flagyl on 08/15/2021.  Transition to oral antibiotics cefepime and Flagyl for 3 additional days. Leukocytosis due to steroids.repeat CBC after 2 weeks and repeat chest x-ray after 4 weeks for resolution of pneumonia.   # Trigeminal neuralgia, Continue gabapentin, at a reduced dose due to renal function.  Used to be on Tegretol but  this has been already discontinued by her neurologist due to medication incompatibility with her current blood thinners # OSA, Continue CPAP nightly # Severe bilateral hearing loss, continue supportive care # Hypothyroidism, Continue levothyroxine # History of DVT, Continue Eliquis # Hyperlipidemia, Continue statin # Anxiety/depression, Continue mirtazapine # Chronic kidney disease stage IIIa, Creatinine stable.  Monitor # History of CVA, Currently stable, continue statin Eliquis # Generalized deconditioning, Overall prognosis is guarded to poor.  Palliative care following. Body mass index is 32.46 kg/m.  Nutrition Interventions:     - Patient was instructed, not to drive, operate heavy machinery, perform activities at heights, swimming or participation in water activities or provide baby sitting services while on Pain, Sleep and Anxiety Medications; until her outpatient Physician has advised to do so again.  - Also recommended to not to take more than prescribed Pain, Sleep and Anxiety Medications.  Patient was seen by physical therapy, who recommended SNF, which was arranged. On the day of the discharge the patient's vitals were stable, and no other acute medical condition were reported by patient. the patient was felt safe to be discharge at [facility].  Consultants: Palliative care Procedures: None  Discharge Exam: General: Appear in no distress, no Rash; Oral Mucosa Clear, moist. Cardiovascular: S1 and S2 Present, no Murmur, Respiratory: normal respiratory effort, Bilateral Air entry present and no Crackles, no wheezes Abdomen: Bowel Sound present, Soft and no tenderness, no hernia Extremities: no Pedal edema, no calf tenderness Neurology: alert and oriented to time, place, and person affect appropriate.  Filed Weights   08/13/21 1811  Weight: 94 kg   Vitals:   08/19/21 0734 08/19/21 0809  BP: (!) 133/59   Pulse: (!) 58   Resp: 18   Temp: 98.1 F (36.7 C)   SpO2: 99% 95%     DISCHARGE MEDICATION: Allergies as of 08/19/2021       Reactions   Penicillins Shortness Of Breath, Rash, Other (See Comments)   Has patient had a PCN reaction causing immediate rash, facial/tongue/throat swelling, SOB or lightheadedness with hypotension: Yes Has patient had a PCN reaction causing severe rash involving mucus membranes or skin necrosis: No Has patient had a PCN reaction that required hospitalization No Has patient had a PCN reaction occurring within the last 10 years: No If all of the above answers are "NO", then may proceed with Cephalosporin use.   Sulfa Antibiotics Shortness Of Breath, Rash, Other (See Comments)   Amitiza [lubiprostone]    N/v/d   Aspirin Other (See Comments)   Reaction:  Unknown  Other reaction(s): Bleeding (intolerance) Per patient " causes nose to bleed" Can take 81 mg daily without any complications Other reaction(s): "bloody nose"   Penicillin G Other (See Comments)   Has patient had a PCN reaction causing immediate rash, facial/tongue/throat swelling, SOB or lightheadedness with hypotension: No Has patient had a PCN reaction causing severe rash involving mucus membranes or skin necrosis: Unknown Has patient had a PCN reaction that required hospitalization: Unknown Has patient had a PCN reaction occurring within the last 10 years: Unknown If all of the above answers are "NO",  then may proceed with Cephalosporin use.        Medication List     TAKE these medications    acetaminophen 325 MG tablet Commonly known as: TYLENOL Take 2 tablets (650 mg total) by mouth every 6 (six) hours as needed.   acetaminophen-codeine 300-30 MG tablet Commonly known as: TYLENOL #3 Take 1 tablet by mouth 2 (two) times daily as needed for moderate pain.   albuterol (2.5 MG/3ML) 0.083% nebulizer solution Commonly known as: PROVENTIL Take 3 mLs (2.5 mg total) by nebulization in the morning, at noon, and at bedtime.   apixaban 2.5 MG Tabs  tablet Commonly known as: Eliquis Take 1 tablet (2.5 mg total) by mouth 2 (two) times daily.   atorvastatin 40 MG tablet Commonly known as: LIPITOR Take 1 tablet (40 mg total) by mouth daily at 6 PM. Generic ok What changed: when to take this   BISACODYL PO Take by mouth.   Calcium Carbonate-Vitamin D 600-400 MG-UNIT tablet Commonly known as: Calcium 600+D Take 1 tablet by mouth 2 (two) times daily. Lunch and dinner   cefdinir 300 MG capsule Commonly known as: OMNICEF Take 1 capsule (300 mg total) by mouth 2 (two) times daily for 3 days.   cetirizine 10 MG tablet Commonly known as: ZYRTEC Take 1 tablet (10 mg total) by mouth daily.   clotrimazole-betamethasone lotion Commonly known as: LOTRISONE Apply 1 application topically 2 (two) times daily.   DOK PO Take 1 tablet by mouth daily.   feeding supplement (GLUCERNA SHAKE) Liqd Take 237 mLs by mouth 2 (two) times daily between meals.   gabapentin 100 MG capsule Commonly known as: NEURONTIN Take 2 capsules (200 mg total) by mouth 3 (three) times daily. What changed:  medication strength how much to take   guaiFENesin 600 MG 12 hr tablet Commonly known as: MUCINEX Take 2 tablets (1,200 mg total) by mouth 2 (two) times daily as needed for cough or to loosen phlegm.   levothyroxine 200 MCG tablet Commonly known as: SYNTHROID Take 1 tablet (200 mcg total) by mouth daily before breakfast. Except on Sunday. Do not take with other medications or vitamins   Melatonin 10 MG Tabs Take 10 mg by mouth at bedtime.   metroNIDAZOLE 500 MG tablet Commonly known as: Flagyl Take 1 tablet (500 mg total) by mouth 3 (three) times daily for 3 days.   mirtazapine 15 MG tablet Commonly known as: Remeron Take 1 tablet (15 mg total) by mouth at bedtime.   multivitamin with minerals Tabs tablet Take 1 tablet by mouth daily.   pantoprazole 40 MG tablet Commonly known as: PROTONIX Take 1 tablet (40 mg total) by mouth daily. 30  minutes before lunch or dinner   polyethylene glycol powder 17 GM/SCOOP powder Commonly known as: GLYCOLAX/MIRALAX Take 17 g by mouth daily.   polyvinyl alcohol 1.4 % ophthalmic solution Commonly known as: LIQUIFILM TEARS Place 1 drop into both eyes at bedtime.   predniSONE 20 MG tablet Commonly known as: DELTASONE Take 2 tablets (40 mg total) by mouth daily with breakfast for 3 days. Start taking on: August 20, 2021 What changed:  medication strength how much to take how to take this when to take this additional instructions   predniSONE 10 MG tablet Commonly known as: DELTASONE Take 3 tablets (30 mg total) by mouth daily with breakfast for 3 days. Start taking on: August 23, 2021 What changed: You were already taking a medication with the same name, and this prescription was  added. Make sure you understand how and when to take each.   predniSONE 20 MG tablet Commonly known as: DELTASONE Take 1 tablet (20 mg total) by mouth daily with breakfast for 3 days. Start taking on: August 26, 2021 What changed: You were already taking a medication with the same name, and this prescription was added. Make sure you understand how and when to take each.   predniSONE 10 MG tablet Commonly known as: DELTASONE Take 1 tablet (10 mg total) by mouth daily with breakfast for 3 days. Start taking on: August 29, 2021 What changed: You were already taking a medication with the same name, and this prescription was added. Make sure you understand how and when to take each.   Trelegy Ellipta 100-62.5-25 MCG/INH Aepb Generic drug: Fluticasone-Umeclidin-Vilant Inhale 1 puff into the lungs daily. Rinse mouth d/c symbicort and spiriva       Allergies  Allergen Reactions   Penicillins Shortness Of Breath, Rash and Other (See Comments)    Has patient had a PCN reaction causing immediate rash, facial/tongue/throat swelling, SOB or lightheadedness with hypotension: Yes Has patient had a  PCN reaction causing severe rash involving mucus membranes or skin necrosis: No Has patient had a PCN reaction that required hospitalization No Has patient had a PCN reaction occurring within the last 10 years: No If all of the above answers are "NO", then may proceed with Cephalosporin use.   Sulfa Antibiotics Shortness Of Breath, Rash and Other (See Comments)   Amitiza [Lubiprostone]     N/v/d   Aspirin Other (See Comments)    Reaction:  Unknown  Other reaction(s): Bleeding (intolerance) Per patient " causes nose to bleed" Can take 81 mg daily without any complications Other reaction(s): "bloody nose"    Penicillin G Other (See Comments)    Has patient had a PCN reaction causing immediate rash, facial/tongue/throat swelling, SOB or lightheadedness with hypotension: No Has patient had a PCN reaction causing severe rash involving mucus membranes or skin necrosis: Unknown Has patient had a PCN reaction that required hospitalization: Unknown Has patient had a PCN reaction occurring within the last 10 years: Unknown If all of the above answers are "NO", then may proceed with Cephalosporin use.   Discharge Instructions     Call MD for:  difficulty breathing, headache or visual disturbances   Complete by: As directed    Call MD for:  temperature >100.4   Complete by: As directed    Diet - low sodium heart healthy   Complete by: As directed    Discharge instructions   Complete by: As directed    Follow-up with PCP in 1 week, but you should be seen by an MD in 1 to 2 days, continue monitor O2 saturation, started on prednisone tapering dose and oral antibiotics for 3 additional days.  Repeat chest x-ray after 4 weeks for resolution of pneumonia.   Increase activity slowly   Complete by: As directed        The results of significant diagnostics from this hospitalization (including imaging, microbiology, ancillary and laboratory) are listed below for reference.    Significant Diagnostic  Studies: DG Chest 2 View  Result Date: 08/01/2021 CLINICAL DATA:  Shortness of breath. EXAM: CHEST - 2 VIEW COMPARISON:  June 08, 2021. FINDINGS: The heart size and mediastinal contours are within normal limits. Hypoinflation of the lungs is noted with mild bibasilar subsegmental atelectasis. No pneumothorax is noted. The visualized skeletal structures are unremarkable. IMPRESSION: Hypoinflation of the lungs  is noted with mild bibasilar subsegmental atelectasis. Electronically Signed   By: Marijo Conception M.D.   On: 08/01/2021 15:58   DG Tibia/Fibula Right  Result Date: 08/01/2021 CLINICAL DATA:  Pain, no known injury EXAM: RIGHT TIBIA AND FIBULA - 2 VIEW COMPARISON:  None. FINDINGS: There is no evidence of fracture or other focal bone lesions. Diffuse soft tissue edema about the lower leg and ankle. IMPRESSION: No fracture or dislocation of the right tibia or fibula. Diffuse soft tissue edema about the lower leg and ankle. Electronically Signed   By: Eddie Candle M.D.   On: 08/01/2021 17:03   CT CHEST WO CONTRAST  Result Date: 08/14/2021 CLINICAL DATA:  Chronic dyspnea EXAM: CT CHEST WITHOUT CONTRAST TECHNIQUE: Multidetector CT imaging of the chest was performed following the standard protocol without IV contrast. COMPARISON:  08/13/2021, 05/04/2021 FINDINGS: Cardiovascular: Unenhanced imaging of the heart and great vessels demonstrates no pericardial effusion. Normal caliber of the thoracic aorta. There is diffuse atherosclerosis of the aorta and coronary vasculature unchanged. Mediastinum/Nodes: No enlarged mediastinal or axillary lymph nodes. Thyroid gland, trachea, and esophagus demonstrate no significant findings. Lungs/Pleura: Dependent areas of consolidation within the lower lobes could reflect hypoventilatory change or airspace disease. No effusion or pneumothorax. There is mild bronchiectasis. Mucoid material is seen filling the left mainstem bronchus. Upper Abdomen: No acute abnormality.  Musculoskeletal: There are no acute or destructive bony lesions. Reconstructed images demonstrate no additional findings. IMPRESSION: 1. Dependent consolidation within the lower lobes, favor aspiration or infection over atelectasis. 2. Chronic bronchiectasis, with mucoid material filling the left lower lobe bronchus. 3.  Aortic Atherosclerosis (ICD10-I70.0). Electronically Signed   By: Randa Ngo M.D.   On: 08/14/2021 17:11   US Venous Img Lower Unilateral Right  Result Date: 08/01/2021 CLINICAL DATA:  Right lower extremity swelling EXAM: RIGHT LOWER EXTREMITY VENOUS DOPPLER ULTRASOUND TECHNIQUE: Gray-scale sonography with compression, as well as color and duplex ultrasound, were performed to evaluate the deep venous system(s) from the level of the common femoral vein through the popliteal and proximal calf veins. COMPARISON:  None. FINDINGS: VENOUS Normal compressibility of the common femoral, superficial femoral, and popliteal veins, as well as the visualized calf veins. Visualized portions of profunda femoral vein and great saphenous vein unremarkable. No filling defects to suggest DVT on grayscale or color Doppler imaging. Doppler waveforms show normal direction of venous flow, normal respiratory plasticity and response to augmentation. Limited views of the contralateral common femoral vein are unremarkable. OTHER None. Limitations: none IMPRESSION: Negative. Electronically Signed   By: Ulyses Jarred M.D.   On: 08/01/2021 19:05   DG Chest Port 1 View  Result Date: 08/13/2021 CLINICAL DATA:  Weakness. EXAM: PORTABLE CHEST 1 VIEW COMPARISON:  Chest x-ray dated August 01, 2021. FINDINGS: The heart size and mediastinal contours are within normal limits. Pulmonary vascular congestion. Continued low lung volumes with bibasilar atelectasis/scarring. No focal consolidation, pleural effusion, or pneumothorax. No acute osseous abnormality. IMPRESSION: 1. Pulmonary vascular congestion. Electronically Signed    By: Titus Dubin M.D.   On: 08/13/2021 13:45   DG Knee Complete 4 Views Right  Result Date: 08/01/2021 CLINICAL DATA:  Pain, no known injury EXAM: RIGHT KNEE - COMPLETE 4+ VIEW COMPARISON:  None. FINDINGS: No fracture or dislocation of the right knee. Mild tricompartmental joint space narrowing and osteophytosis, worst in the medial compartment. No knee joint effusion. Soft tissues are unremarkable. IMPRESSION: 1.  No fracture or dislocation of the right knee. 2. Mild tricompartmental joint space narrowing  and osteophytosis, worst in the medial compartment. Electronically Signed   By: Eddie Candle M.D.   On: 08/01/2021 17:04   DG Hip Unilat W or Wo Pelvis 2-3 Views Right  Result Date: 08/01/2021 CLINICAL DATA:  Right hip pain, no known injury EXAM: DG HIP (WITH OR WITHOUT PELVIS) 2-3V RIGHT COMPARISON:  None. FINDINGS: Osteopenia. There is no evidence of displaced hip fracture or dislocation. Hip joint spaces are well preserved. Nonobstructive pattern of overlying bowel gas. IMPRESSION: 1.  Osteopenia. No evidence of displaced fracture or dislocation. 2.  Hip joint spaces are well preserved. Electronically Signed   By: Eddie Candle M.D.   On: 08/01/2021 17:03    Microbiology: Recent Results (from the past 240 hour(s))  Blood culture (single)     Status: None   Collection Time: 08/13/21  1:32 PM   Specimen: BLOOD  Result Value Ref Range Status   Specimen Description BLOOD RIGHT ANTECUBITAL  Final   Special Requests   Final    BOTTLES DRAWN AEROBIC AND ANAEROBIC Blood Culture results may not be optimal due to an inadequate volume of blood received in culture bottles   Culture   Final    NO GROWTH 5 DAYS Performed at [facility], [address] [place], [place] [address]    Report Status 08/18/2021 FINAL  Final  Resp Panel by RT-PCR (Flu A&B, Covid) Nasopharyngeal Swab     Status: None   Collection Time: 08/13/21  2:06 PM   Specimen: Nasopharyngeal Swab; Nasopharyngeal(NP) swabs  in vial transport medium  Result Value Ref Range Status   SARS Coronavirus 2 by RT PCR NEGATIVE NEGATIVE Final    Comment: (NOTE) SARS-CoV-2 target nucleic acids are NOT DETECTED.  The SARS-CoV-2 RNA is generally detectable in upper respiratory specimens during the acute phase of infection. The lowest concentration of SARS-CoV-2 viral copies this assay can detect is 138 copies/mL. A negative result does not preclude SARS-Cov-2 infection and should not be used as the sole basis for treatment or other patient management decisions. A negative result may occur with  improper specimen collection/handling, submission of specimen other than nasopharyngeal swab, presence of viral mutation(s) within the areas targeted by this assay, and inadequate number of viral copies(<138 copies/mL). A negative result must be combined with clinical observations, patient history, and epidemiological information. The expected result is Negative.  Fact Sheet for Patients:  EntrepreneurPulse.com.au  Fact Sheet for Healthcare Providers:  IncredibleEmployment.be  This test is no t yet approved or cleared by the [place] FDA and  has been authorized for detection and/or diagnosis of SARS-CoV-2 by FDA under an Emergency Use Authorization (EUA). This EUA will remain  in effect (meaning this test can be used) for the duration of the COVID-19 declaration under Section 564(b)(1) of the Act, 21 U.S.C.section 360bbb-3(b)(1), unless the authorization is terminated  or revoked sooner.       Influenza A by PCR NEGATIVE NEGATIVE Final   Influenza B by PCR NEGATIVE NEGATIVE Final    Comment: (NOTE) The Xpert Xpress SARS-CoV-2/FLU/RSV plus assay is intended as an aid in the diagnosis of influenza from Nasopharyngeal swab specimens and should not be used as a sole basis for treatment. Nasal washings and aspirates are unacceptable for Xpert Xpress  SARS-CoV-2/FLU/RSV testing.  Fact Sheet for Patients: EntrepreneurPulse.com.au  Fact Sheet for Healthcare Providers: IncredibleEmployment.be  This test is not yet approved or cleared by the [place] FDA and has been authorized for detection and/or diagnosis of SARS-CoV-2 by FDA  under an Emergency Use Authorization (EUA). This EUA will remain in effect (meaning this test can be used) for the duration of the COVID-19 declaration under Section 564(b)(1) of the Act, 21 U.S.C. section 360bbb-3(b)(1), unless the authorization is terminated or revoked.  Performed at [facility], [address] [place], [place] [address]      Labs: CBC: Recent Labs  Lab 08/15/21 0424 08/16/21 0921 08/17/21 0304 08/18/21 0344 08/19/21 0606  WBC 17.0* 15.3* 10.7* 9.9 9.2  NEUTROABS 16.1* 14.6* 9.9*  --   --   HGB 13.0 13.0 13.0 12.4 12.7  HCT 38.5 40.0 38.0 38.2 37.6  MCV 93.9 93.2 92.7 92.9 92.8  PLT 159 189 160 161 427   Basic Metabolic Panel: Recent Labs  Lab 08/14/21 0439 08/15/21 0424 08/16/21 0921 08/17/21 0304 08/18/21 0344 08/19/21 0606  NA 140 138 137 140 141 140  K 3.9 4.0 3.8 4.2 4.0 4.2  CL 101 99 98 102 100 104  CO2 30 31 30 31  32 31  GLUCOSE 129* 246* 329* 295* 148* 192*  BUN 23 31* 42* 46* 42* 34*  CREATININE 1.26* 1.46* 1.41* 1.36* 1.03* 0.98  CALCIUM 8.2* 8.1* 8.1* 8.0* 8.0* 8.0*  MG 2.1 2.2 1.6* 2.6* 2.2 2.2  PHOS 4.5  --   --  3.4 2.9 2.9   Liver Function Tests: Recent Labs  Lab 08/13/21 1332 08/15/21 0424  AST 32 13*  ALT 16 16  ALKPHOS 59 70  BILITOT 1.6* 0.9  PROT 5.1* 6.0*  ALBUMIN 2.6* 2.7*   No results for input(s): LIPASE, AMYLASE in the last 168 hours. No results for input(s): AMMONIA in the last 168 hours. Cardiac Enzymes: No results for input(s): CKTOTAL, CKMB, CKMBINDEX, TROPONINI in the last 168 hours. BNP (last 3 results) Recent Labs    04/26/21 1848 08/01/21 1542 08/13/21 1654   BNP 69.6 64.0 152.7*   CBG: Recent Labs  Lab 08/18/21 1201 08/18/21 1931 08/18/21 2220 08/19/21 0737 08/19/21 1126  GLUCAP 105* 380* 319* 187* 208*    Time spent: 35 minutes  Signed:  Val Riles  [facility]  08/19/2021 1:01 PM

## 2021-08-19 NOTE — Progress Notes (Addendum)
Speech Language Pathology Treatment: Dysphagia  Patient Details Name: Jessica Erickson MRN: [id number] DOB: [birth year] Today's Date: 08/19/2021 Time: 1856-3149 SLP Time Calculation (min) (ACUTE ONLY): 30 min  Assessment / Plan / Recommendation Clinical Impression  Pt seen for ongoing assessment of swallowing. She is alert, verbally responsive and engaged in communication w/ SLP; written information/communication given d/t pt being deaf. Pt is on [place] O2 at 2L; wbc wnl.  Discussed w/, and explained to, pt the importance of the recommended aspiration precautions and swallowing strategies as posted in room; Handouts. Noted the Handouts were not on pt's tray table and she also had Straws in cups in room which are NOT recommended per the aspiration precautions. Pt nodded and agreed verbally to the need for following them when shown and placed beside her, especially sitting upright for all oral intake and using the strong throat clear/cough after drinking liquids. Pt was supported behind the back more for full upright sitting. She then fed herself breakfast meal of purees and soft solids and thin liquids via Cup. No consistent overt clinical s/s of aspiration were noted during intake, but w/ pt's Baseline wet respirations and vocal quality at times, it was difficult to tell. She did exhibit immediate, strong coughing post sips of thin liquids x2 -- noted larger, quick sips. Pt was heavily educated on following precautions: SMALL, SINGLE, sips SLOWLY and using a strong Throat clear/Cough after the swallow/sip. Pt practiced this and followed through w/ the strategies w/ verbal cues (and presence) from this Clinician. Unsure if pt remembers to follow through w/ strategies when no one is present to given cues.  Respiratory status did not decline further from its Baseline during/after the meal. Oral phase appeared grossly [facility] for bolus management and timely A-P transfer for swallowing; oral clearing achieved w/ all  consistencies. NSG present to give Pills Whole in Puree as posted in room/chart.    Pt appears at risk for aspiration thus Pulmonary decline d/t inconsistent follow through w/ aspiration precautions and strategies as per MBSS. When using these, the risk can be reduced.   Recommend ongoing Discussion w/ Palliative Care services for Lib[facility] Negron Torres and re: dysphagia.  Recommend continue a Regular/mech soft diet for ease of soft foods w/ gravies added to moisten foods; Thin liquids. Recommend strict aspiration precautions and compensatory strategies w/ all oral intake; Pills Whole in Puree; tray setup and positioning assistance for meals. ST services can f/u at next venue of care for further education and monitoring as needed. MD to reconsult if new needs arise while admitted. NSG updated. Precautions posted at bedside. Handouts left in room.      HPI HPI: Pt is a 87 y.o. female with medical history significant for severe bilateral hearing loss - Deaf, COPD, history of CVA with no residual deficits, hypothyroid, history of DVT on Eliquis, Obesity, chronic bibasilar pulmonary scarring, CKD 3A, chronic respiratory failure requiring 2 L nasal cannula oxygen supplementation, presents to the emergency department for chief concerns of not feeling well.  She initially was less responsive at admit but has since improved and engages w/ others appropriately w/ alertness/awareness.  She responds verbally to written questions and follows instructions.  Pt has significant Pulmonary history s/p extensive smoking history per Sons; COPD.      SLP Plan  All goals met      Recommendations for follow up therapy are one component of a multi-disciplinary discharge planning process, led by the attending physician.  Recommendations may be updated based on patient status,  additional functional criteria and insurance authorization.    Recommendations  Diet recommendations: Regular;Thin liquid Liquids provided via: Cup;No  straw Medication Administration: Whole meds with puree Supervision: Patient able to self feed;Intermittent supervision to cue for compensatory strategies (for cues to follow aspiration precautions and strategies) Compensations: Minimize environmental distractions;Slow rate;Small sips/bites;Lingual sweep for clearance of pocketing;Follow solids with liquid;Clear throat after each swallow;Hard cough after swallow Postural Changes and/or Swallow Maneuvers: Upright 30-60 min after meal;Seated upright 90 degrees;Out of bed for meals                General recommendations:  (Palliative Care f/u re: dysphagia and GOC) Oral Care Recommendations: Oral care BID;Oral care before and after PO;Staff/trained caregiver to provide oral care Follow up Recommendations: None SLP Visit Diagnosis: Dysphagia, oropharyngeal phase (R13.12) (advanced age; COPD and Pulmonary decline baseline) Plan: All goals met       GO                 Orinda Kenner, MS, CCC-SLP Speech Language Pathologist Rehab Services [phone] [facility] 08/19/2021, 12:24 PM

## 2021-08-19 NOTE — Care Management Important Message (Signed)
Important Message  Patient Details  Name: ALEXSA FLAUM MRN: [id number] Date of Birth: 1926-04-02   Medicare Important Message Given:  Yes     Dannette Barbara 08/19/2021, 11:20 AM

## 2021-08-20 LAB — VITAMIN B1: Vitamin B1 (Thiamine): 419.3 nmol/L — ABNORMAL HIGH (ref 66.5–200.0)

## 2021-08-22 DIAGNOSIS — I5032 Chronic diastolic (congestive) heart failure: Secondary | ICD-10-CM | POA: Diagnosis not present

## 2021-08-22 DIAGNOSIS — J9611 Chronic respiratory failure with hypoxia: Secondary | ICD-10-CM | POA: Diagnosis not present

## 2021-08-22 DIAGNOSIS — J449 Chronic obstructive pulmonary disease, unspecified: Secondary | ICD-10-CM | POA: Diagnosis not present

## 2021-08-22 DIAGNOSIS — H9193 Unspecified hearing loss, bilateral: Secondary | ICD-10-CM | POA: Insufficient documentation

## 2021-08-22 DIAGNOSIS — Z86718 Personal history of other venous thrombosis and embolism: Secondary | ICD-10-CM | POA: Diagnosis not present

## 2021-09-05 DIAGNOSIS — J449 Chronic obstructive pulmonary disease, unspecified: Secondary | ICD-10-CM | POA: Diagnosis not present

## 2021-09-06 ENCOUNTER — Telehealth: Payer: Self-pay | Admitting: Internal Medicine

## 2021-09-06 DIAGNOSIS — N1832 Chronic kidney disease, stage 3b: Secondary | ICD-10-CM | POA: Diagnosis not present

## 2021-09-06 DIAGNOSIS — I2510 Atherosclerotic heart disease of native coronary artery without angina pectoris: Secondary | ICD-10-CM | POA: Diagnosis not present

## 2021-09-06 DIAGNOSIS — I130 Hypertensive heart and chronic kidney disease with heart failure and stage 1 through stage 4 chronic kidney disease, or unspecified chronic kidney disease: Secondary | ICD-10-CM | POA: Diagnosis not present

## 2021-09-06 DIAGNOSIS — J9611 Chronic respiratory failure with hypoxia: Secondary | ICD-10-CM | POA: Diagnosis not present

## 2021-09-06 DIAGNOSIS — E1140 Type 2 diabetes mellitus with diabetic neuropathy, unspecified: Secondary | ICD-10-CM | POA: Diagnosis not present

## 2021-09-06 DIAGNOSIS — E1122 Type 2 diabetes mellitus with diabetic chronic kidney disease: Secondary | ICD-10-CM | POA: Diagnosis not present

## 2021-09-06 DIAGNOSIS — I5032 Chronic diastolic (congestive) heart failure: Secondary | ICD-10-CM | POA: Diagnosis not present

## 2021-09-06 DIAGNOSIS — J45909 Unspecified asthma, uncomplicated: Secondary | ICD-10-CM | POA: Diagnosis not present

## 2021-09-06 DIAGNOSIS — J479 Bronchiectasis, uncomplicated: Secondary | ICD-10-CM | POA: Diagnosis not present

## 2021-09-06 NOTE — Telephone Encounter (Signed)
Called and spoke to Tinea's son [name]. [name] verbalized understanding to [place] staying on the metformin. Scheduled for a Hospital F/u on 09/29/21.

## 2021-09-06 NOTE — Telephone Encounter (Signed)
Before going in to the hospital Patient was on Metformin, taken off and placed on Januvia, and then taken off the Januvia as well.   Patient has been in and out of the hospital and while in the hospital they had the Patient on IV drip Solumedrol. Patient was also placed on an oral prednisone. With this her blood sugars spiked. The hospital then placed the Patient on Metformin twice daily.   Daughter states Patient takes the last of the Prednisone today. Wanting to know if the Patient needs to continue the Metformin? Does she need to be seen?   Please advise

## 2021-09-06 NOTE — Telephone Encounter (Signed)
This is one of Dr Audrie Gallus pts.  Per review of chart and recent hospitalizations, she was discharged to SNF.  Last a1c in August 6.3.  she has been in the hospital and on steroids with increased sugars.  Not sure what her sugars are running now.  If she has been taking metformin, then I would recommend continuing metformin for now.  Follow sugars. Does need a f/u appt scheduled.  Also , please make sure she has a f/u appt scheduled with Dr Olivia Mackie.

## 2021-09-07 ENCOUNTER — Telehealth: Payer: Self-pay | Admitting: Internal Medicine

## 2021-09-07 DIAGNOSIS — I5032 Chronic diastolic (congestive) heart failure: Secondary | ICD-10-CM | POA: Diagnosis not present

## 2021-09-07 DIAGNOSIS — R8290 Unspecified abnormal findings in urine: Secondary | ICD-10-CM

## 2021-09-07 DIAGNOSIS — J45909 Unspecified asthma, uncomplicated: Secondary | ICD-10-CM | POA: Diagnosis not present

## 2021-09-07 DIAGNOSIS — J479 Bronchiectasis, uncomplicated: Secondary | ICD-10-CM | POA: Diagnosis not present

## 2021-09-07 DIAGNOSIS — J9611 Chronic respiratory failure with hypoxia: Secondary | ICD-10-CM | POA: Diagnosis not present

## 2021-09-07 DIAGNOSIS — I130 Hypertensive heart and chronic kidney disease with heart failure and stage 1 through stage 4 chronic kidney disease, or unspecified chronic kidney disease: Secondary | ICD-10-CM | POA: Diagnosis not present

## 2021-09-07 DIAGNOSIS — Z87440 Personal history of urinary (tract) infections: Secondary | ICD-10-CM

## 2021-09-07 DIAGNOSIS — E1140 Type 2 diabetes mellitus with diabetic neuropathy, unspecified: Secondary | ICD-10-CM | POA: Diagnosis not present

## 2021-09-07 DIAGNOSIS — E1122 Type 2 diabetes mellitus with diabetic chronic kidney disease: Secondary | ICD-10-CM | POA: Diagnosis not present

## 2021-09-07 DIAGNOSIS — N1832 Chronic kidney disease, stage 3b: Secondary | ICD-10-CM | POA: Diagnosis not present

## 2021-09-07 DIAGNOSIS — I2510 Atherosclerotic heart disease of native coronary artery without angina pectoris: Secondary | ICD-10-CM | POA: Diagnosis not present

## 2021-09-07 NOTE — Telephone Encounter (Signed)
Okayed per Dr Derrel Nip, orders placed. Patient's daughter called back in and will be on her way to pick up urine cup. Placed upfront for pick up.

## 2021-09-07 NOTE — Telephone Encounter (Signed)
Patient's daughter calling in and wanting a urine sample ordered. Onset of urinary odor today, Patient has history of UTIs. Patient just released from the hospital.   No available appointment. Okay for urine orders?

## 2021-09-07 NOTE — Addendum Note (Signed)
Addended by: Thressa Sheller on: 09/07/2021 02:47 PM   Modules accepted: Orders

## 2021-09-08 ENCOUNTER — Other Ambulatory Visit: Payer: Medicare PPO

## 2021-09-08 DIAGNOSIS — Z87440 Personal history of urinary (tract) infections: Secondary | ICD-10-CM

## 2021-09-08 DIAGNOSIS — R8290 Unspecified abnormal findings in urine: Secondary | ICD-10-CM

## 2021-09-08 DIAGNOSIS — N3000 Acute cystitis without hematuria: Secondary | ICD-10-CM

## 2021-09-09 LAB — URINE CULTURE
MICRO NUMBER:: 12470116
SPECIMEN QUALITY:: ADEQUATE

## 2021-09-09 LAB — MICROSCOPIC MESSAGE

## 2021-09-09 LAB — URINALYSIS, ROUTINE W REFLEX MICROSCOPIC
Bilirubin Urine: NEGATIVE
Glucose, UA: NEGATIVE
Ketones, ur: NEGATIVE
Nitrite: NEGATIVE
Protein, ur: NEGATIVE
Specific Gravity, Urine: 1.015 (ref 1.001–1.035)
pH: 6.5 (ref 5.0–8.0)

## 2021-09-09 NOTE — Telephone Encounter (Signed)
Patient's daughter calling in and saw results of Patient's urinalysis.   Wanting to know if something could be sent in for over the weekend while waiting on the culture?   Please advise

## 2021-09-09 NOTE — Telephone Encounter (Signed)
Called to speak with Jessica Erickson and explained that we are waiting on the urine culture. Jessica Erickson verbalized understanding and had no further questions.

## 2021-09-10 ENCOUNTER — Ambulatory Visit: Payer: Medicare PPO

## 2021-09-12 DIAGNOSIS — J479 Bronchiectasis, uncomplicated: Secondary | ICD-10-CM | POA: Diagnosis not present

## 2021-09-12 DIAGNOSIS — I130 Hypertensive heart and chronic kidney disease with heart failure and stage 1 through stage 4 chronic kidney disease, or unspecified chronic kidney disease: Secondary | ICD-10-CM | POA: Diagnosis not present

## 2021-09-12 DIAGNOSIS — E1122 Type 2 diabetes mellitus with diabetic chronic kidney disease: Secondary | ICD-10-CM | POA: Diagnosis not present

## 2021-09-12 DIAGNOSIS — J45909 Unspecified asthma, uncomplicated: Secondary | ICD-10-CM | POA: Diagnosis not present

## 2021-09-12 DIAGNOSIS — I5032 Chronic diastolic (congestive) heart failure: Secondary | ICD-10-CM | POA: Diagnosis not present

## 2021-09-12 DIAGNOSIS — N1832 Chronic kidney disease, stage 3b: Secondary | ICD-10-CM | POA: Diagnosis not present

## 2021-09-12 DIAGNOSIS — J9611 Chronic respiratory failure with hypoxia: Secondary | ICD-10-CM | POA: Diagnosis not present

## 2021-09-13 DIAGNOSIS — E1122 Type 2 diabetes mellitus with diabetic chronic kidney disease: Secondary | ICD-10-CM | POA: Diagnosis not present

## 2021-09-13 DIAGNOSIS — J45909 Unspecified asthma, uncomplicated: Secondary | ICD-10-CM | POA: Diagnosis not present

## 2021-09-13 DIAGNOSIS — J479 Bronchiectasis, uncomplicated: Secondary | ICD-10-CM | POA: Diagnosis not present

## 2021-09-13 DIAGNOSIS — J9611 Chronic respiratory failure with hypoxia: Secondary | ICD-10-CM | POA: Diagnosis not present

## 2021-09-13 DIAGNOSIS — I5032 Chronic diastolic (congestive) heart failure: Secondary | ICD-10-CM | POA: Diagnosis not present

## 2021-09-13 DIAGNOSIS — E1140 Type 2 diabetes mellitus with diabetic neuropathy, unspecified: Secondary | ICD-10-CM | POA: Diagnosis not present

## 2021-09-13 DIAGNOSIS — N1832 Chronic kidney disease, stage 3b: Secondary | ICD-10-CM | POA: Diagnosis not present

## 2021-09-13 DIAGNOSIS — I130 Hypertensive heart and chronic kidney disease with heart failure and stage 1 through stage 4 chronic kidney disease, or unspecified chronic kidney disease: Secondary | ICD-10-CM | POA: Diagnosis not present

## 2021-09-13 DIAGNOSIS — I2510 Atherosclerotic heart disease of native coronary artery without angina pectoris: Secondary | ICD-10-CM | POA: Diagnosis not present

## 2021-09-15 DIAGNOSIS — E785 Hyperlipidemia, unspecified: Secondary | ICD-10-CM | POA: Insufficient documentation

## 2021-09-15 DIAGNOSIS — E7800 Pure hypercholesterolemia, unspecified: Secondary | ICD-10-CM | POA: Insufficient documentation

## 2021-09-15 DIAGNOSIS — I2510 Atherosclerotic heart disease of native coronary artery without angina pectoris: Secondary | ICD-10-CM | POA: Insufficient documentation

## 2021-09-15 DIAGNOSIS — I10 Essential (primary) hypertension: Secondary | ICD-10-CM | POA: Diagnosis not present

## 2021-09-15 DIAGNOSIS — J449 Chronic obstructive pulmonary disease, unspecified: Secondary | ICD-10-CM | POA: Diagnosis not present

## 2021-09-15 DIAGNOSIS — K219 Gastro-esophageal reflux disease without esophagitis: Secondary | ICD-10-CM | POA: Diagnosis not present

## 2021-09-15 DIAGNOSIS — N1831 Chronic kidney disease, stage 3a: Secondary | ICD-10-CM | POA: Diagnosis not present

## 2021-09-15 DIAGNOSIS — E1122 Type 2 diabetes mellitus with diabetic chronic kidney disease: Secondary | ICD-10-CM | POA: Diagnosis not present

## 2021-09-16 ENCOUNTER — Telehealth: Payer: Self-pay | Admitting: Internal Medicine

## 2021-09-16 DIAGNOSIS — E1140 Type 2 diabetes mellitus with diabetic neuropathy, unspecified: Secondary | ICD-10-CM | POA: Diagnosis not present

## 2021-09-16 DIAGNOSIS — I5032 Chronic diastolic (congestive) heart failure: Secondary | ICD-10-CM | POA: Diagnosis not present

## 2021-09-16 DIAGNOSIS — J45909 Unspecified asthma, uncomplicated: Secondary | ICD-10-CM | POA: Diagnosis not present

## 2021-09-16 DIAGNOSIS — I2510 Atherosclerotic heart disease of native coronary artery without angina pectoris: Secondary | ICD-10-CM | POA: Diagnosis not present

## 2021-09-16 DIAGNOSIS — J479 Bronchiectasis, uncomplicated: Secondary | ICD-10-CM | POA: Diagnosis not present

## 2021-09-16 DIAGNOSIS — E1122 Type 2 diabetes mellitus with diabetic chronic kidney disease: Secondary | ICD-10-CM | POA: Diagnosis not present

## 2021-09-16 DIAGNOSIS — J9611 Chronic respiratory failure with hypoxia: Secondary | ICD-10-CM | POA: Diagnosis not present

## 2021-09-16 DIAGNOSIS — N1832 Chronic kidney disease, stage 3b: Secondary | ICD-10-CM | POA: Diagnosis not present

## 2021-09-16 DIAGNOSIS — I130 Hypertensive heart and chronic kidney disease with heart failure and stage 1 through stage 4 chronic kidney disease, or unspecified chronic kidney disease: Secondary | ICD-10-CM | POA: Diagnosis not present

## 2021-09-16 NOTE — Telephone Encounter (Signed)
Patient's daughter calling back in. States when she finally got a call back from an access nurse they hung up on her. States she has been on the phone for an hour trying to get care for her mother this morning.   Apologized to the daughter for this. Daughter states the mother had a lot of bright red blood in the toilet and on her tissues after using the bathroom this morning. Patient also has been fatigued for some time now.  Patient recently evaluated for UTI and tests were negative. Informed the daughter that the Patient needs to be fully evaluated in person as her test were negative but symptoms are persisting. The daughter agrees and states she will take her mother to [facility] urgent care as they are connected to the hospital in case Patient ends up needing emergency room treatment.

## 2021-09-16 NOTE — Telephone Encounter (Signed)
Patient informed, Due to the high volume of calls and your symptoms we have to forward your call to our Triage Nurse to expedient your call. Please hold for the transfer.  Patients daughter transferred to Access Nurse because patient is not able to hear well. Due to having bright red blood in her urine this morning and wondering what to do for this symptom.No openings in office or virtual for the patient to be evaluated.

## 2021-09-16 NOTE — Telephone Encounter (Signed)
Providing Access Nurse Documentation.   

## 2021-09-19 ENCOUNTER — Telehealth: Payer: Self-pay

## 2021-09-19 DIAGNOSIS — E1122 Type 2 diabetes mellitus with diabetic chronic kidney disease: Secondary | ICD-10-CM | POA: Diagnosis not present

## 2021-09-19 DIAGNOSIS — I2510 Atherosclerotic heart disease of native coronary artery without angina pectoris: Secondary | ICD-10-CM | POA: Diagnosis not present

## 2021-09-19 DIAGNOSIS — I5032 Chronic diastolic (congestive) heart failure: Secondary | ICD-10-CM | POA: Diagnosis not present

## 2021-09-19 DIAGNOSIS — J9611 Chronic respiratory failure with hypoxia: Secondary | ICD-10-CM | POA: Diagnosis not present

## 2021-09-19 DIAGNOSIS — N1832 Chronic kidney disease, stage 3b: Secondary | ICD-10-CM | POA: Diagnosis not present

## 2021-09-19 DIAGNOSIS — I130 Hypertensive heart and chronic kidney disease with heart failure and stage 1 through stage 4 chronic kidney disease, or unspecified chronic kidney disease: Secondary | ICD-10-CM | POA: Diagnosis not present

## 2021-09-19 DIAGNOSIS — E1140 Type 2 diabetes mellitus with diabetic neuropathy, unspecified: Secondary | ICD-10-CM | POA: Diagnosis not present

## 2021-09-19 DIAGNOSIS — J479 Bronchiectasis, uncomplicated: Secondary | ICD-10-CM | POA: Diagnosis not present

## 2021-09-19 DIAGNOSIS — J45909 Unspecified asthma, uncomplicated: Secondary | ICD-10-CM | POA: Diagnosis not present

## 2021-09-19 NOTE — Telephone Encounter (Signed)
Please call to f/u with pt as she was having blood in urine.  Was she seen at urgent care last week?

## 2021-09-19 NOTE — Telephone Encounter (Addendum)
The blood  was in her urine in the toilet, but nurse from Health [employer] told son could be from internal Hemorrhoids, no blood all week end witth son and none this morning. In seeing Note from today advised son patient needs to be seen tonight in ED or UC could be bleeding internally or could be other reason cannot advised without being seen.

## 2021-09-19 NOTE — Telephone Encounter (Signed)
Received a call from Jessica Erickson's daughter Jessica Erickson. Jessica Erickson stated that Jessica Erickson used to be on Metformin but was changed to Januvia by her PCP. Pt was recently in the hospital and was started on Metformin. Jessica Erickson states that they have previously called and scheduled an appointment for 09/29/21 to discuss the medication and was instructed to continue their metformin until then. Pt currently takes 500mg  twice daily. Pt takes it with food at Jessica Erickson is unsure if it is immediate or extended release.  Jessica Erickson has continued the metformin and has had continued fatigue and loss of appetite. Jessica Erickson states that Jessica Erickson sleeps constantly through the day and was instructed to decrease the Gabapentin by Jessica Erickson's neurologist. This has not helped and Jessica Erickson has been experiencing Nausea with no vomiting x1week. Jessica Erickson asks if they could stop the Metformin and if a glucose meter could be sent in to check BS at home. Jessica Erickson asks if the loss of appetite and constant fatigue is due to the metformin medication. Sent to PCP as she returns on 09/20/21 and Jessica Erickson.

## 2021-09-20 ENCOUNTER — Telehealth: Payer: Self-pay | Admitting: Internal Medicine

## 2021-09-20 DIAGNOSIS — J9611 Chronic respiratory failure with hypoxia: Secondary | ICD-10-CM | POA: Diagnosis not present

## 2021-09-20 DIAGNOSIS — J479 Bronchiectasis, uncomplicated: Secondary | ICD-10-CM | POA: Diagnosis not present

## 2021-09-20 DIAGNOSIS — I2510 Atherosclerotic heart disease of native coronary artery without angina pectoris: Secondary | ICD-10-CM | POA: Diagnosis not present

## 2021-09-20 DIAGNOSIS — I5032 Chronic diastolic (congestive) heart failure: Secondary | ICD-10-CM | POA: Diagnosis not present

## 2021-09-20 DIAGNOSIS — I130 Hypertensive heart and chronic kidney disease with heart failure and stage 1 through stage 4 chronic kidney disease, or unspecified chronic kidney disease: Secondary | ICD-10-CM | POA: Diagnosis not present

## 2021-09-20 DIAGNOSIS — E1140 Type 2 diabetes mellitus with diabetic neuropathy, unspecified: Secondary | ICD-10-CM | POA: Diagnosis not present

## 2021-09-20 DIAGNOSIS — J45909 Unspecified asthma, uncomplicated: Secondary | ICD-10-CM | POA: Diagnosis not present

## 2021-09-20 DIAGNOSIS — E1122 Type 2 diabetes mellitus with diabetic chronic kidney disease: Secondary | ICD-10-CM | POA: Diagnosis not present

## 2021-09-20 DIAGNOSIS — N1832 Chronic kidney disease, stage 3b: Secondary | ICD-10-CM | POA: Diagnosis not present

## 2021-09-20 NOTE — Telephone Encounter (Signed)
Jessica Erickson from [place] is calling to request a prescription for diabetic testing supplies for the patient of a meter,strips and a lancet.She would like for it to be written for the accucheck roche guide me or the accucheck roche guide due to the patient not knowing which one was sent to her pharmacy.Please send to the [facility] for the patient and call her daughter with an update.

## 2021-09-20 NOTE — Telephone Encounter (Signed)
Please advise. Ok to order Accuchek diabetic meter and strips? Which glucose meter would you like ordered.

## 2021-09-20 NOTE — Telephone Encounter (Signed)
Patients daughter [name] is calling in to check on the message below.Please call her at [phone].

## 2021-09-20 NOTE — Telephone Encounter (Signed)
Last A1C 6.3 07/2021 I did not want her on metformin due to kidney function stop  Januvia costs too much so stop Want her to stop both and control diet   Gabapentin can make her sleepy so agree with reduced dose but this medication as well as metformin can cause nausea

## 2021-09-20 NOTE — Telephone Encounter (Signed)
Called and spoke with Caoilainn's son [name]. [name] states that he has already stopped the metformin and is agreeable to Dr. Audrie Gallus instructions. Pt requests a blood glucose monitor to be sent in through [facility]. Ok to send in a Glucose Monitor? Pt has an appointment on 09/29/21

## 2021-09-21 ENCOUNTER — Encounter: Payer: Self-pay | Admitting: Internal Medicine

## 2021-09-22 MED ORDER — GLUCOSE BLOOD VI STRP: ORAL_STRIP | 3 refills | Status: DC

## 2021-09-22 MED ORDER — ACCU-CHEK SOFTCLIX LANCETS MISC: 12 refills | Status: DC

## 2021-09-22 MED ORDER — ACCU-CHEK AVIVA PLUS W/DEVICE KIT: PACK | 0 refills | Status: AC

## 2021-09-22 NOTE — Telephone Encounter (Signed)
Patients son is calling in to check on the status of the message below.He stated [facility] has the patients glucometer ready they just need a prescription for Accucheck.Please call patients son Roda Lauture at [phone].

## 2021-09-22 NOTE — Telephone Encounter (Signed)
Meter sent in. Called back and informed the daughter Ms. Lovena Le of this. Also clarified the below with medication changes.   "Last A1C 6.3 07/2021 I did not want her on metformin due to kidney function stop  Januvia costs too much so stop Want her to stop both and control diet    Gabapentin can make her sleepy so agree with reduced dose but this medication as well as metformin can cause nausea"

## 2021-09-23 DIAGNOSIS — I130 Hypertensive heart and chronic kidney disease with heart failure and stage 1 through stage 4 chronic kidney disease, or unspecified chronic kidney disease: Secondary | ICD-10-CM | POA: Diagnosis not present

## 2021-09-23 DIAGNOSIS — I5032 Chronic diastolic (congestive) heart failure: Secondary | ICD-10-CM | POA: Diagnosis not present

## 2021-09-23 DIAGNOSIS — E1122 Type 2 diabetes mellitus with diabetic chronic kidney disease: Secondary | ICD-10-CM | POA: Diagnosis not present

## 2021-09-23 DIAGNOSIS — J45909 Unspecified asthma, uncomplicated: Secondary | ICD-10-CM | POA: Diagnosis not present

## 2021-09-23 DIAGNOSIS — J479 Bronchiectasis, uncomplicated: Secondary | ICD-10-CM | POA: Diagnosis not present

## 2021-09-23 DIAGNOSIS — N1832 Chronic kidney disease, stage 3b: Secondary | ICD-10-CM | POA: Diagnosis not present

## 2021-09-23 DIAGNOSIS — E1140 Type 2 diabetes mellitus with diabetic neuropathy, unspecified: Secondary | ICD-10-CM | POA: Diagnosis not present

## 2021-09-23 DIAGNOSIS — J9611 Chronic respiratory failure with hypoxia: Secondary | ICD-10-CM | POA: Diagnosis not present

## 2021-09-23 DIAGNOSIS — I2510 Atherosclerotic heart disease of native coronary artery without angina pectoris: Secondary | ICD-10-CM | POA: Diagnosis not present

## 2021-09-23 NOTE — Telephone Encounter (Signed)
I spoke with patient's son, [name]. He stated that no more blood has been seen since the first time almost two weeks ago & was thought to be a hemorrhoid. He said that his sister has also been corresponding with Arianna this week & Dr. Olivia Mackie. Pt follows up with Dr. Olivia Mackie next Thursday 10/27. He said that currently his mom said that she was having some leg & calf pain. They are monitoring & patient wears compression due to swelling. I asked that they be vigilant for worsening pain, redness or heat. Pt's son stated that he would do so & knows to take patient to ED if this occurred.

## 2021-09-23 NOTE — Telephone Encounter (Signed)
Fyi [facility] Rec'ed call about pt seeing  blood in urine   Judson Roch, can you call pt and see if she doing better? I dont see she went to ED for blood in toilet?

## 2021-09-27 DIAGNOSIS — I2510 Atherosclerotic heart disease of native coronary artery without angina pectoris: Secondary | ICD-10-CM | POA: Diagnosis not present

## 2021-09-27 DIAGNOSIS — I5032 Chronic diastolic (congestive) heart failure: Secondary | ICD-10-CM | POA: Diagnosis not present

## 2021-09-27 DIAGNOSIS — J45909 Unspecified asthma, uncomplicated: Secondary | ICD-10-CM | POA: Diagnosis not present

## 2021-09-27 DIAGNOSIS — I130 Hypertensive heart and chronic kidney disease with heart failure and stage 1 through stage 4 chronic kidney disease, or unspecified chronic kidney disease: Secondary | ICD-10-CM | POA: Diagnosis not present

## 2021-09-27 DIAGNOSIS — N1832 Chronic kidney disease, stage 3b: Secondary | ICD-10-CM | POA: Diagnosis not present

## 2021-09-27 DIAGNOSIS — E1122 Type 2 diabetes mellitus with diabetic chronic kidney disease: Secondary | ICD-10-CM | POA: Diagnosis not present

## 2021-09-27 DIAGNOSIS — J9611 Chronic respiratory failure with hypoxia: Secondary | ICD-10-CM | POA: Diagnosis not present

## 2021-09-27 DIAGNOSIS — J479 Bronchiectasis, uncomplicated: Secondary | ICD-10-CM | POA: Diagnosis not present

## 2021-09-27 DIAGNOSIS — E1140 Type 2 diabetes mellitus with diabetic neuropathy, unspecified: Secondary | ICD-10-CM | POA: Diagnosis not present

## 2021-09-28 DIAGNOSIS — J45909 Unspecified asthma, uncomplicated: Secondary | ICD-10-CM | POA: Diagnosis not present

## 2021-09-28 DIAGNOSIS — J9611 Chronic respiratory failure with hypoxia: Secondary | ICD-10-CM | POA: Diagnosis not present

## 2021-09-28 DIAGNOSIS — E1122 Type 2 diabetes mellitus with diabetic chronic kidney disease: Secondary | ICD-10-CM | POA: Diagnosis not present

## 2021-09-28 DIAGNOSIS — I2510 Atherosclerotic heart disease of native coronary artery without angina pectoris: Secondary | ICD-10-CM | POA: Diagnosis not present

## 2021-09-28 DIAGNOSIS — I5032 Chronic diastolic (congestive) heart failure: Secondary | ICD-10-CM | POA: Diagnosis not present

## 2021-09-28 DIAGNOSIS — I130 Hypertensive heart and chronic kidney disease with heart failure and stage 1 through stage 4 chronic kidney disease, or unspecified chronic kidney disease: Secondary | ICD-10-CM | POA: Diagnosis not present

## 2021-09-28 DIAGNOSIS — N1832 Chronic kidney disease, stage 3b: Secondary | ICD-10-CM | POA: Diagnosis not present

## 2021-09-28 DIAGNOSIS — E1140 Type 2 diabetes mellitus with diabetic neuropathy, unspecified: Secondary | ICD-10-CM | POA: Diagnosis not present

## 2021-09-28 DIAGNOSIS — J479 Bronchiectasis, uncomplicated: Secondary | ICD-10-CM | POA: Diagnosis not present

## 2021-09-29 ENCOUNTER — Other Ambulatory Visit: Payer: Self-pay

## 2021-09-29 ENCOUNTER — Ambulatory Visit (INDEPENDENT_AMBULATORY_CARE_PROVIDER_SITE_OTHER): Payer: Medicare PPO | Admitting: Internal Medicine

## 2021-09-29 ENCOUNTER — Encounter: Payer: Self-pay | Admitting: Internal Medicine

## 2021-09-29 VITALS — BP 122/70 | HR 86 | Temp 97.6°F | Ht 67.0 in | Wt 193.0 lb

## 2021-09-29 DIAGNOSIS — M48061 Spinal stenosis, lumbar region without neurogenic claudication: Secondary | ICD-10-CM | POA: Diagnosis not present

## 2021-09-29 DIAGNOSIS — G629 Polyneuropathy, unspecified: Secondary | ICD-10-CM

## 2021-09-29 DIAGNOSIS — Z7689 Persons encountering health services in other specified circumstances: Secondary | ICD-10-CM

## 2021-09-29 DIAGNOSIS — E039 Hypothyroidism, unspecified: Secondary | ICD-10-CM

## 2021-09-29 DIAGNOSIS — R5383 Other fatigue: Secondary | ICD-10-CM

## 2021-09-29 DIAGNOSIS — M79604 Pain in right leg: Secondary | ICD-10-CM

## 2021-09-29 DIAGNOSIS — Z23 Encounter for immunization: Secondary | ICD-10-CM

## 2021-09-29 DIAGNOSIS — M47817 Spondylosis without myelopathy or radiculopathy, lumbosacral region: Secondary | ICD-10-CM

## 2021-09-29 DIAGNOSIS — E119 Type 2 diabetes mellitus without complications: Secondary | ICD-10-CM | POA: Diagnosis not present

## 2021-09-29 DIAGNOSIS — J449 Chronic obstructive pulmonary disease, unspecified: Secondary | ICD-10-CM

## 2021-09-29 DIAGNOSIS — R5381 Other malaise: Secondary | ICD-10-CM

## 2021-09-29 DIAGNOSIS — M1990 Unspecified osteoarthritis, unspecified site: Secondary | ICD-10-CM

## 2021-09-29 DIAGNOSIS — E611 Iron deficiency: Secondary | ICD-10-CM

## 2021-09-29 DIAGNOSIS — M79605 Pain in left leg: Secondary | ICD-10-CM

## 2021-09-29 LAB — LIPID PANEL
Cholesterol: 131 mg/dL (ref 0–200)
HDL: 43.7 mg/dL (ref >39.00)
LDL Cholesterol: 62 mg/dL (ref 0–99)
NonHDL: 86.84
Total CHOL/HDL Ratio: 3
Triglycerides: 125 mg/dL (ref 0.0–149.0)
VLDL: 25 mg/dL (ref 0.0–40.0)

## 2021-09-29 LAB — COMPREHENSIVE METABOLIC PANEL
ALT: 14 U/L (ref 0–35)
AST: 20 U/L (ref 0–37)
Albumin: 3.5 g/dL (ref 3.5–5.2)
Alkaline Phosphatase: 62 U/L (ref 39–117)
BUN: 19 mg/dL (ref 6–23)
CO2: 37 mEq/L — ABNORMAL HIGH (ref 19–32)
Calcium: 8.9 mg/dL (ref 8.4–10.5)
Chloride: 99 mEq/L (ref 96–112)
Creatinine, Ser: 1.2 mg/dL (ref 0.40–1.20)
GFR: 39.04 mL/min — ABNORMAL LOW (ref >60.00)
Glucose, Bld: 144 mg/dL — ABNORMAL HIGH (ref 70–99)
Potassium: 3.6 mEq/L (ref 3.5–5.1)
Sodium: 144 mEq/L (ref 135–145)
Total Bilirubin: 0.5 mg/dL (ref 0.2–1.2)
Total Protein: 6.7 g/dL (ref 6.0–8.3)

## 2021-09-29 LAB — TSH: TSH: 1.46 u[IU]/mL (ref 0.35–5.50)

## 2021-09-29 LAB — CBC WITH DIFFERENTIAL/PLATELET
Basophils Absolute: 0 10*3/uL (ref 0.0–0.1)
Basophils Relative: 0.6 % (ref 0.0–3.0)
Eosinophils Absolute: 0.1 10*3/uL (ref 0.0–0.7)
Eosinophils Relative: 1.6 % (ref 0.0–5.0)
HCT: 42.5 % (ref 36.0–46.0)
Hemoglobin: 13.5 g/dL (ref 12.0–15.0)
Lymphocytes Relative: 24.2 % (ref 12.0–46.0)
Lymphs Abs: 1.7 10*3/uL (ref 0.7–4.0)
MCHC: 31.9 g/dL (ref 30.0–36.0)
MCV: 92.4 fl (ref 78.0–100.0)
Monocytes Absolute: 0.7 10*3/uL (ref 0.1–1.0)
Monocytes Relative: 9.4 % (ref 3.0–12.0)
Neutro Abs: 4.6 10*3/uL (ref 1.4–7.7)
Neutrophils Relative %: 64.2 % (ref 43.0–77.0)
Platelets: 220 10*3/uL (ref 150.0–400.0)
RBC: 4.6 Mil/uL (ref 3.87–5.11)
RDW: 14.9 % (ref 11.5–15.5)
WBC: 7.2 10*3/uL (ref 4.0–10.5)

## 2021-09-29 MED ORDER — ACETAMINOPHEN 500 MG PO TABS: 1000.0000 mg | ORAL_TABLET | Freq: Two times a day (BID) | ORAL | 3 refills | Status: DC | PRN

## 2021-09-29 NOTE — Telephone Encounter (Signed)
I believe this was done she has strips if so click done if not send in meter  Spoke to pt and son at visit and they have needed supplies

## 2021-09-29 NOTE — Progress Notes (Addendum)
Chief Complaint  Patient presents with   Hospitalization Follow-up   F/u with son [name]  1. 08/01/21 and 08/13/21 ed visit for copd with ct chest abnormal but stable bronchiectasis she was having increased wheezing, cough with clear phelgm but doing better now at times on 2L O2 and o2 sat 91-93% at times w/o O2 it drops to 77% O2  Est with pulmonary  Advised to use flutter valve and neb treatments bid to tid and trelegy prn and albuterol neb prn  2. Dm 2 advised stop metformin, januvia cbg 113-130 3. Deconditioning doing pt 1x per week now family does not want to put her in a home and increase PT to 3x per week and add OT Bonnie with [facility]  4. C/o unable to use rollator with seat due to b/l leg pain h/o b/l dvt, spinal stenosis and lumbar arthritis see #3 pt in manual wheelchair needs Rx and needs Rx hospital bed and toilet seat elevator and wants home PT/oT 2x per week  Family doing Tyelenol 650 Q6 hours advised can do 1000 mg bid to tid prn and Tylenol #3 for more significant pain  Advised her to f/u vascular, renal 5. Family requests labs today  6. Gabapentin 400 mg tid was reduced to 200 mg tid due to drowsiness/fatigue and this reduction has helped   Review of Systems  Constitutional:  Positive for malaise/fatigue. Negative for weight loss.  HENT:  Positive for hearing loss.   Eyes:  Negative for blurred vision.  Respiratory:  Positive for shortness of breath and wheezing.   Cardiovascular:  Negative for chest pain.  Musculoskeletal:  Positive for back pain and myalgias.  Skin:  Negative for rash.  Psychiatric/Behavioral:  Negative for depression.   Past Medical History:  Diagnosis Date   Acoustic neuroma (HCC)    Allergy    Asthma    Bilateral swelling of feet    and legs   Bladder infection    CAD (coronary artery disease)    Cataract    Change in voice    Compression fracture of body of thoracic vertebra (HCC)    T12 09/18/15 MRI s/p fall    Constipation    COPD  (chronic obstructive pulmonary disease) (HCC)    previous CXR with chronic interstitial lung dz    CVA (cerebral vascular accident) ([place])    Depression    Diabetes ([place])    with neuropathy   Diabetes mellitus, type 2 ([place])    Diarrhea    Double vision    DVT (deep venous thrombosis) ([place])    right leg 10/2015 was on coumadin off as of 2017/2018 ; s/p IVC filter   Enuresis    Eye pain, right    Fall    Fatty liver    09/15/15 also mildly dilated pancreatitic duct rec MRCP small sub cm cyst hemangioma speeln mild right hydronephrorossi and prox. hydroureter, kidney stones, mild scarring kidneys   Female stress incontinence    Flank pain    GERD (gastroesophageal reflux disease)    with small hiatal hernia    Hard of hearing    Heart disease    History of kidney problems    Hyperlipidemia    mixed   Hypertension    Hypothyroidism, postsurgical    Impaired mobility and ADLs    uses rolling walker has caretaker 24/7 at home   Leg edema    Mixed incontinence urge and stress (female)(female)    Neuropathy    Osteoarthritis  DDD spine    Osteoporosis with fracture    T12 compression fracture   Photophobia    Pulmonary embolism ([place])    10/2015 off coumadin as of 04/2016   Pulmonary HTN (HCC)    mild pulm HTN, echo 10/09/15 EF 15-72%IOMBT 1 dd, RV systolic pressure increased    Recurrent UTI    Sinus pressure    Skin cancer    BCC jawline and scalp    Thyroid disease    follows [facility]   TIA (transient ischemic attack)    MRI 2009/2010 neg stroke    Trigeminal neuralgia    Dr. Tomi Bamberger s/p gamma knife x 2, on Tegretol since 2011/2012 no increase in dose >200 mg bid rec per family per neurology    Urinary frequency    Urinary, incontinence, stress female    Dr Erlene Quan urology    Past Surgical History:  Procedure Laterality Date   APPENDECTOMY     as a child, open   [place] removal 1996    brain tumor surgery     BREAST SURGERY     breast  bx   CATARACT EXTRACTION     CHOLECYSTECTOMY     EYE SURGERY     cataract   IVC FILTER PLACEMENT ([place] HX)     Dr. Lucky Cowboy 10/2015    LAPAROSCOPIC TUBAL LIGATION     MOHS SURGERY     scalp 04/2014    PERIPHERAL VASCULAR CATHETERIZATION N/A 10/11/2015   Procedure: IVC Filter Insertion;  Surgeon: Algernon Huxley, MD;  Location: [facility];  Service: Cardiovascular;  Laterality: N/A;   PERIPHERAL VASCULAR THROMBECTOMY Bilateral 03/29/2018   Procedure: PERIPHERAL VASCULAR THROMBECTOMY;  Surgeon: Algernon Huxley, MD;  Location: [facility];  Service: Cardiovascular;  Laterality: Bilateral;   PUBOVAGINAL SLING     THROAT SURGERY     THYROID SURGERY     tumor around vocal cords    TOOTH EXTRACTION     winter 2018    TOTAL THYROIDECTOMY  1976   Family History  Problem Relation Age of Onset   Heart disease Mother    Diabetes Father    Cancer Daughter        breast ca x 2 s/p mastectomy    Social History   Socioeconomic History   Marital status: Widowed    Spouse name: Not on file   Number of children: 5   Years of education: Not on file   Highest education level: Not on file  Occupational History   Occupation: retired  Tobacco Use   Smoking status: Former    Packs/day: 0.50    Years: 20.00    Pack years: 10.00    Types: Cigarettes    Quit date: 09/20/1995    Years since quitting: 26.0   Smokeless tobacco: Never   Tobacco comments:    quit 1996 smoked 20 years max 8 cig qd   Vaping Use   Vaping Use: Never used  Substance and Sexual Activity   Alcohol use: No   Drug use: No   Sexual activity: Not on file  Other Topics Concern   Not on file  Social History Narrative   Lives at home with caretaker currently [name]    Has children    Likes to ride stationary bike and sew             Social Determinants of Health   Financial Resource Strain: Not  on file  Food Insecurity: Not on file  Transportation Needs: Not on file  Physical Activity: Not on file   Stress: Not on file  Social Connections: Not on file  Intimate Partner Violence: Not on file   Current Meds  Medication Sig   Accu-Chek Softclix Lancets lancets Use as instructed   acetaminophen-codeine (TYLENOL #3) 300-30 MG tablet Take 1 tablet by mouth 2 (two) times daily as needed for moderate pain.   albuterol (PROVENTIL) (2.5 MG/3ML) 0.083% nebulizer solution Take 3 mLs (2.5 mg total) by nebulization in the morning, at noon, and at bedtime.   apixaban (ELIQUIS) 2.5 MG TABS tablet Take 1 tablet (2.5 mg total) by mouth 2 (two) times daily.   atorvastatin (LIPITOR) 40 MG tablet Take 1 tablet (40 mg total) by mouth daily at 6 PM. Generic ok (Patient taking differently: Take 40 mg by mouth daily. Generic ok)   BISACODYL PO Take by mouth.   Blood Glucose Monitoring Suppl (ACCU-CHEK AVIVA PLUS) w/Device KIT Use as directed   Calcium Carbonate-Vitamin D (CALCIUM 600+D) 600-400 MG-UNIT tablet Take 1 tablet by mouth 2 (two) times daily. Lunch and dinner   cetirizine (ZYRTEC) 10 MG tablet Take 1 tablet (10 mg total) by mouth daily.   clotrimazole-betamethasone (LOTRISONE) lotion Apply 1 application topically 2 (two) times daily.   Docusate Sodium (DOK PO) Take 1 tablet by mouth daily.   feeding supplement, GLUCERNA SHAKE, (GLUCERNA SHAKE) LIQD Take 237 mLs by mouth 2 (two) times daily between meals.   Fluticasone-Umeclidin-Vilant (TRELEGY ELLIPTA) 100-62.5-25 MCG/INH AEPB Inhale 1 puff into the lungs daily. Rinse mouth d/c symbicort and spiriva   gabapentin (NEURONTIN) 100 MG capsule Take 2 capsules (200 mg total) by mouth 3 (three) times daily.   glucose blood test strip Use to test blood sugar twice daily.   guaiFENesin (MUCINEX) 600 MG 12 hr tablet Take 2 tablets (1,200 mg total) by mouth 2 (two) times daily as needed for cough or to loosen phlegm.   levothyroxine (SYNTHROID) 200 MCG tablet Take 1 tablet (200 mcg total) by mouth daily before breakfast. Except on Sunday. Do not take with other  medications or vitamins   Melatonin 10 MG TABS Take 10 mg by mouth at bedtime.   mirtazapine (REMERON) 15 MG tablet Take 1 tablet (15 mg total) by mouth at bedtime.   Multiple Vitamin (MULTIVITAMIN WITH MINERALS) TABS tablet Take 1 tablet by mouth daily.    pantoprazole (PROTONIX) 40 MG tablet Take 1 tablet (40 mg total) by mouth daily. 30 minutes before lunch or dinner   polyethylene glycol powder (GLYCOLAX/MIRALAX) 17 GM/SCOOP powder Take 17 g by mouth daily.   polyvinyl alcohol (LIQUIFILM TEARS) 1.4 % ophthalmic solution Place 1 drop into both eyes at bedtime.   [DISCONTINUED] acetaminophen (TYLENOL) 325 MG tablet Take 2 tablets (650 mg total) by mouth every 6 (six) hours as needed.   Allergies  Allergen Reactions   Penicillins Shortness Of Breath, Rash and Other (See Comments)    Has patient had a PCN reaction causing immediate rash, facial/tongue/throat swelling, SOB or lightheadedness with hypotension: Yes Has patient had a PCN reaction causing severe rash involving mucus membranes or skin necrosis: No Has patient had a PCN reaction that required hospitalization No Has patient had a PCN reaction occurring within the last 10 years: No If all of the above answers are "NO", then may proceed with Cephalosporin use.   Sulfa Antibiotics Shortness Of Breath, Rash and Other (See Comments)   Amitiza [Lubiprostone]  N/v/d   Aspirin Other (See Comments)    Reaction:  Unknown  Other reaction(s): Bleeding (intolerance) Per patient " causes nose to bleed" Can take 81 mg daily without any complications Other reaction(s): "bloody nose"    Penicillin G Other (See Comments)    Has patient had a PCN reaction causing immediate rash, facial/tongue/throat swelling, SOB or lightheadedness with hypotension: No Has patient had a PCN reaction causing severe rash involving mucus membranes or skin necrosis: Unknown Has patient had a PCN reaction that required hospitalization: Unknown Has patient had a  PCN reaction occurring within the last 10 years: Unknown If all of the above answers are "NO", then may proceed with Cephalosporin use.   Recent Results (from the past 2160 hour(s))  CBC     Status: None   Collection Time: 08/01/21  3:42 PM  Result Value Ref Range   WBC 7.3 4.0 - 10.5 K/uL   RBC 4.44 3.87 - 5.11 MIL/uL   Hemoglobin 13.4 12.0 - 15.0 g/dL   HCT 42.1 36.0 - 46.0 %   MCV 94.8 80.0 - 100.0 fL   MCH 30.2 26.0 - 34.0 pg   MCHC 31.8 30.0 - 36.0 g/dL   RDW 14.2 11.5 - 15.5 %   Platelets 218 150 - 400 K/uL   nRBC 0.0 0.0 - 0.2 %    Comment: Performed at [facility], [address] [place], [place] [address]  Basic metabolic panel     Status: Abnormal   Collection Time: 08/01/21  3:42 PM  Result Value Ref Range   Sodium 142 135 - 145 mmol/L   Potassium 3.7 3.5 - 5.1 mmol/L   Chloride 100 98 - 111 mmol/L   CO2 34 (H) 22 - 32 mmol/L   Glucose, Bld 129 (H) 70 - 99 mg/dL    Comment: Glucose reference range applies only to samples taken after fasting for at least 8 hours.   BUN 21 8 - 23 mg/dL   Creatinine, Ser 1.34 (H) 0.44 - 1.00 mg/dL   Calcium 8.7 (L) 8.9 - 10.3 mg/dL   GFR, Estimated 37 (L) >60 mL/min    Comment: (NOTE) Calculated using the CKD-EPI Creatinine Equation (2021)    Anion gap 8 5 - 15    Comment: Performed at [facility], [place], [place] [address]  Troponin I (High Sensitivity)     Status: None   Collection Time: 08/01/21  3:42 PM  Result Value Ref Range   Troponin I (High Sensitivity) 9 <18 ng/L    Comment: (NOTE) Elevated high sensitivity troponin I (hsTnI) values and significant  changes across serial measurements may suggest ACS but many other  chronic and acute conditions are known to elevate hsTnI results.  Refer to the Links section for chest pain algorithms and additional  guidance. Performed at [name] [address] [place],  [address]   Hepatic function panel     Status: None    Collection Time: 08/01/21  3:42 PM  Result Value Ref Range   Total Protein 6.9 6.5 - 8.1 g/dL   Albumin 3.5 3.5 - 5.0 g/dL   AST 25 15 - 41 U/L   ALT 17 0 - 44 U/L   Alkaline Phosphatase 68 38 - 126 U/L   Total Bilirubin 0.7 0.3 - 1.2 mg/dL   Bilirubin, Direct <0.1 0.0 - 0.2 mg/dL   Indirect Bilirubin NOT CALCULATED 0.3 - 0.9 mg/dL    Comment: Performed at [facility], [place], [place] [address]  Brain natriuretic peptide     Status: None   Collection Time: 08/01/21  3:42 PM  Result Value Ref Range   B Natriuretic Peptide 64.0 0.0 - 100.0 pg/mL    Comment: Performed at [facility], [address] [place], [place] [address]  Resp Panel by RT-PCR (Flu A&B, Covid) Nasopharyngeal Swab     Status: None   Collection Time: 08/01/21  4:35 PM   Specimen: Nasopharyngeal Swab; Nasopharyngeal(NP) swabs in vial transport medium  Result Value Ref Range   SARS Coronavirus 2 by RT PCR NEGATIVE NEGATIVE    Comment: (NOTE) SARS-CoV-2 target nucleic acids are NOT DETECTED.  The SARS-CoV-2 RNA is generally detectable in upper respiratory specimens during the acute phase of infection. The lowest concentration of SARS-CoV-2 viral copies this assay can detect is 138 copies/mL. A negative result does not preclude SARS-Cov-2 infection and should not be used as the sole basis for treatment or other patient management decisions. A negative result may occur with  improper specimen collection/handling, submission of specimen other than nasopharyngeal swab, presence of viral mutation(s) within the areas targeted by this assay, and inadequate number of viral copies(<138 copies/mL). A negative result must be combined with clinical observations, patient history, and epidemiological information. The expected result is Negative.  Fact Sheet for Patients:  EntrepreneurPulse.com.au  Fact Sheet for Healthcare Providers:   IncredibleEmployment.be  This test is no t yet approved or cleared by the [place] FDA and  has been authorized for detection and/or diagnosis of SARS-CoV-2 by FDA under an Emergency Use Authorization (EUA). This EUA will remain  in effect (meaning this test can be used) for the duration of the COVID-19 declaration under Section 564(b)(1) of the Act, 21 U.S.C.section 360bbb-3(b)(1), unless the authorization is terminated  or revoked sooner.       Influenza A by PCR NEGATIVE NEGATIVE   Influenza B by PCR NEGATIVE NEGATIVE    Comment: (NOTE) The Xpert Xpress SARS-CoV-2/FLU/RSV plus assay is intended as an aid in the diagnosis of influenza from Nasopharyngeal swab specimens and should not be used as a sole basis for treatment. Nasal washings and aspirates are unacceptable for Xpert Xpress SARS-CoV-2/FLU/RSV testing.  Fact Sheet for Patients: EntrepreneurPulse.com.au  Fact Sheet for Healthcare Providers: IncredibleEmployment.be  This test is not yet approved or cleared by the [place] FDA and has been authorized for detection and/or diagnosis of SARS-CoV-2 by FDA under an Emergency Use Authorization (EUA). This EUA will remain in effect (meaning this test can be used) for the duration of the COVID-19 declaration under Section 564(b)(1) of the Act, 21 U.S.C. section 360bbb-3(b)(1), unless the authorization is terminated or revoked.  Performed at [facility], [address] [place], [place] [address]   CBG monitoring, ED     Status: Abnormal   Collection Time: 08/01/21  7:43 PM  Result Value Ref Range   Glucose-Capillary 114 (H) 70 - 99 mg/dL    Comment: Glucose reference range applies only to samples taken after fasting for at least 8 hours.  Comprehensive metabolic panel     Status: Abnormal   Collection Time: 08/02/21  4:13 AM  Result Value Ref Range   Sodium 138 135 - 145 mmol/L   Potassium 3.9  3.5 - 5.1 mmol/L   Chloride 102 98 - 111 mmol/L   CO2 29 22 - 32 mmol/L   Glucose, Bld 242 (H) 70 - 99 mg/dL    Comment: Glucose  reference range applies only to samples taken after fasting for at least 8 hours.   BUN 20 8 - 23 mg/dL   Creatinine, Ser 1.20 (H) 0.44 - 1.00 mg/dL   Calcium 8.4 (L) 8.9 - 10.3 mg/dL   Total Protein 6.5 6.5 - 8.1 g/dL   Albumin 3.2 (L) 3.5 - 5.0 g/dL   AST 22 15 - 41 U/L   ALT 16 0 - 44 U/L   Alkaline Phosphatase 61 38 - 126 U/L   Total Bilirubin 0.6 0.3 - 1.2 mg/dL   GFR, Estimated 42 (L) >60 mL/min    Comment: (NOTE) Calculated using the CKD-EPI Creatinine Equation (2021)    Anion gap 7 5 - 15    Comment: Performed at [facility], [address] [place], [place] [address]  CBC     Status: Abnormal   Collection Time: 08/02/21  4:13 AM  Result Value Ref Range   WBC 3.6 (L) 4.0 - 10.5 K/uL   RBC 4.35 3.87 - 5.11 MIL/uL   Hemoglobin 13.1 12.0 - 15.0 g/dL   HCT 40.5 36.0 - 46.0 %   MCV 93.1 80.0 - 100.0 fL   MCH 30.1 26.0 - 34.0 pg   MCHC 32.3 30.0 - 36.0 g/dL   RDW 13.9 11.5 - 15.5 %   Platelets 180 150 - 400 K/uL   nRBC 0.0 0.0 - 0.2 %    Comment: Performed at [facility], [address] [place], [place] [address]  Hemoglobin A1c     Status: Abnormal   Collection Time: 08/02/21  4:13 AM  Result Value Ref Range   Hgb A1c MFr Bld 6.3 (H) 4.8 - 5.6 %    Comment: (NOTE)         Prediabetes: 5.7 - 6.4         Diabetes: >6.4         Glycemic control for adults with diabetes: <7.0    Mean Plasma Glucose 134 mg/dL    Comment: (NOTE) Performed At: [place], [place] [id number] Rush Farmer MD [id number]   Troponin I (High Sensitivity)     Status: None   Collection Time: 08/02/21  4:13 AM  Result Value Ref Range   Troponin I (High Sensitivity) 7 <18 ng/L    Comment: (NOTE) Elevated high sensitivity troponin I (hsTnI) values and significant  changes across serial measurements may  suggest ACS but many other  chronic and acute conditions are known to elevate hsTnI results.  Refer to the "Links" section for chest pain algorithms and additional  guidance. Performed at [facility], [address] [place], [place] [address]   Glucose, capillary     Status: Abnormal   Collection Time: 08/02/21  8:34 AM  Result Value Ref Range   Glucose-Capillary 218 (H) 70 - 99 mg/dL    Comment: Glucose reference range applies only to samples taken after fasting for at least 8 hours.  Glucose, capillary     Status: Abnormal   Collection Time: 08/02/21 11:57 AM  Result Value Ref Range   Glucose-Capillary 233 (H) 70 - 99 mg/dL    Comment: Glucose reference range applies only to samples taken after fasting for at least 8 hours.  Glucose, capillary     Status: Abnormal   Collection Time: 08/02/21  4:26 PM  Result Value Ref Range   Glucose-Capillary 195 (H) 70 - 99 mg/dL    Comment: Glucose reference range applies only to samples taken  after fasting for at least 8 hours.  Basic metabolic panel     Status: Abnormal   Collection Time: 08/03/21  5:14 AM  Result Value Ref Range   Sodium 140 135 - 145 mmol/L   Potassium 4.5 3.5 - 5.1 mmol/L   Chloride 101 98 - 111 mmol/L   CO2 34 (H) 22 - 32 mmol/L   Glucose, Bld 181 (H) 70 - 99 mg/dL    Comment: Glucose reference range applies only to samples taken after fasting for at least 8 hours.   BUN 28 (H) 8 - 23 mg/dL   Creatinine, Ser 1.16 (H) 0.44 - 1.00 mg/dL   Calcium 8.5 (L) 8.9 - 10.3 mg/dL   GFR, Estimated 44 (L) >60 mL/min    Comment: (NOTE) Calculated using the CKD-EPI Creatinine Equation (2021)    Anion gap 5 5 - 15    Comment: Performed at [facility], [address] [place], [place] [address]  CBC     Status: None   Collection Time: 08/03/21  5:14 AM  Result Value Ref Range   WBC 7.2 4.0 - 10.5 K/uL   RBC 4.10 3.87 - 5.11 MIL/uL   Hemoglobin 12.2 12.0 - 15.0 g/dL   HCT 37.9 36.0 - 46.0 %   MCV 92.4  80.0 - 100.0 fL   MCH 29.8 26.0 - 34.0 pg   MCHC 32.2 30.0 - 36.0 g/dL   RDW 13.7 11.5 - 15.5 %   Platelets 182 150 - 400 K/uL   nRBC 0.0 0.0 - 0.2 %    Comment: Performed at [facility], [address] [place], [place] [address]  Glucose, capillary     Status: Abnormal   Collection Time: 08/03/21  7:42 AM  Result Value Ref Range   Glucose-Capillary 151 (H) 70 - 99 mg/dL    Comment: Glucose reference range applies only to samples taken after fasting for at least 8 hours.  Glucose, capillary     Status: Abnormal   Collection Time: 08/03/21 11:24 AM  Result Value Ref Range   Glucose-Capillary 135 (H) 70 - 99 mg/dL    Comment: Glucose reference range applies only to samples taken after fasting for at least 8 hours.  Glucose, capillary     Status: Abnormal   Collection Time: 08/03/21  3:45 PM  Result Value Ref Range   Glucose-Capillary 217 (H) 70 - 99 mg/dL    Comment: Glucose reference range applies only to samples taken after fasting for at least 8 hours.  CBC     Status: None   Collection Time: 08/04/21  5:09 AM  Result Value Ref Range   WBC 7.8 4.0 - 10.5 K/uL   RBC 4.03 3.87 - 5.11 MIL/uL   Hemoglobin 12.0 12.0 - 15.0 g/dL   HCT 37.2 36.0 - 46.0 %   MCV 92.3 80.0 - 100.0 fL   MCH 29.8 26.0 - 34.0 pg   MCHC 32.3 30.0 - 36.0 g/dL   RDW 14.2 11.5 - 15.5 %   Platelets 196 150 - 400 K/uL   nRBC 0.0 0.0 - 0.2 %    Comment: Performed at [facility], [address], [place], [place] [address]  Basic metabolic panel     Status: Abnormal   Collection Time: 08/04/21  5:09 AM  Result Value Ref Range   Sodium 139 135 - 145 mmol/L   Potassium 4.0 3.5 - 5.1 mmol/L   Chloride 101 98 - 111 mmol/L   CO2 32  22 - 32 mmol/L   Glucose, Bld 172 (H) 70 - 99 mg/dL    Comment: Glucose reference range applies only to samples taken after fasting for at least 8 hours.   BUN 35 (H) 8 - 23 mg/dL   Creatinine, Ser 1.23 (H) 0.44 - 1.00 mg/dL   Calcium 8.4 (L) 8.9 - 10.3 mg/dL    GFR, Estimated 41 (L) >60 mL/min    Comment: (NOTE) Calculated using the CKD-EPI Creatinine Equation (2021)    Anion gap 6 5 - 15    Comment: Performed at [facility], [address] [place], [place] [address]  Glucose, capillary     Status: Abnormal   Collection Time: 08/04/21  7:16 AM  Result Value Ref Range   Glucose-Capillary 133 (H) 70 - 99 mg/dL    Comment: Glucose reference range applies only to samples taken after fasting for at least 8 hours.  Glucose, capillary     Status: Abnormal   Collection Time: 08/04/21 11:50 AM  Result Value Ref Range   Glucose-Capillary 111 (H) 70 - 99 mg/dL    Comment: Glucose reference range applies only to samples taken after fasting for at least 8 hours.  Glucose, capillary     Status: Abnormal   Collection Time: 08/04/21  4:21 PM  Result Value Ref Range   Glucose-Capillary 234 (H) 70 - 99 mg/dL    Comment: Glucose reference range applies only to samples taken after fasting for at least 8 hours.  Glucose, capillary     Status: Abnormal   Collection Time: 08/04/21  7:54 PM  Result Value Ref Range   Glucose-Capillary 244 (H) 70 - 99 mg/dL    Comment: Glucose reference range applies only to samples taken after fasting for at least 8 hours.  Resp Panel by RT-PCR (Flu A&B, Covid) Nasopharyngeal Swab     Status: None   Collection Time: 08/04/21 10:50 PM   Specimen: Nasopharyngeal Swab; Nasopharyngeal(NP) swabs in vial transport medium  Result Value Ref Range   SARS Coronavirus 2 by RT PCR NEGATIVE NEGATIVE    Comment: (NOTE) SARS-CoV-2 target nucleic acids are NOT DETECTED.  The SARS-CoV-2 RNA is generally detectable in upper respiratory specimens during the acute phase of infection. The lowest concentration of SARS-CoV-2 viral copies this assay can detect is 138 copies/mL. A negative result does not preclude SARS-Cov-2 infection and should not be used as the sole basis for treatment or other patient management decisions. A negative  result may occur with  improper specimen collection/handling, submission of specimen other than nasopharyngeal swab, presence of viral mutation(s) within the areas targeted by this assay, and inadequate number of viral copies(<138 copies/mL). A negative result must be combined with clinical observations, patient history, and epidemiological information. The expected result is Negative.  Fact Sheet for Patients:  EntrepreneurPulse.com.au  Fact Sheet for Healthcare Providers:  IncredibleEmployment.be  This test is no t yet approved or cleared by the [place] FDA and  has been authorized for detection and/or diagnosis of SARS-CoV-2 by FDA under an Emergency Use Authorization (EUA). This EUA will remain  in effect (meaning this test can be used) for the duration of the COVID-19 declaration under Section 564(b)(1) of the Act, 21 U.S.C.section 360bbb-3(b)(1), unless the authorization is terminated  or revoked sooner.       Influenza A by PCR NEGATIVE NEGATIVE   Influenza B by PCR NEGATIVE NEGATIVE    Comment: (NOTE) The Xpert Xpress SARS-CoV-2/FLU/RSV plus assay is intended as an aid  in the diagnosis of influenza from Nasopharyngeal swab specimens and should not be used as a sole basis for treatment. Nasal washings and aspirates are unacceptable for Xpert Xpress SARS-CoV-2/FLU/RSV testing.  Fact Sheet for Patients: EntrepreneurPulse.com.au  Fact Sheet for Healthcare Providers: IncredibleEmployment.be  This test is not yet approved or cleared by the [place] FDA and has been authorized for detection and/or diagnosis of SARS-CoV-2 by FDA under an Emergency Use Authorization (EUA). This EUA will remain in effect (meaning this test can be used) for the duration of the COVID-19 declaration under Section 564(b)(1) of the Act, 21 U.S.C. section 360bbb-3(b)(1), unless the authorization is terminated  or revoked.  Performed at [facility], [address] [place], [place] [address]   Glucose, capillary     Status: Abnormal   Collection Time: 08/05/21  7:49 AM  Result Value Ref Range   Glucose-Capillary 121 (H) 70 - 99 mg/dL    Comment: Glucose reference range applies only to samples taken after fasting for at least 8 hours.  Glucose, capillary     Status: Abnormal   Collection Time: 08/05/21 11:40 AM  Result Value Ref Range   Glucose-Capillary 168 (H) 70 - 99 mg/dL    Comment: Glucose reference range applies only to samples taken after fasting for at least 8 hours.  CBC     Status: Abnormal   Collection Time: 08/13/21  1:32 PM  Result Value Ref Range   WBC 23.6 (H) 4.0 - 10.5 K/uL   RBC 4.45 3.87 - 5.11 MIL/uL   Hemoglobin 13.9 12.0 - 15.0 g/dL   HCT 42.0 36.0 - 46.0 %   MCV 94.4 80.0 - 100.0 fL   MCH 31.2 26.0 - 34.0 pg   MCHC 33.1 30.0 - 36.0 g/dL   RDW 14.8 11.5 - 15.5 %   Platelets 192 150 - 400 K/uL   nRBC 0.0 0.0 - 0.2 %    Comment: Performed at [facility], [address] [place], [place] [address]  Comprehensive metabolic panel     Status: Abnormal   Collection Time: 08/13/21  1:32 PM  Result Value Ref Range   Sodium 138 135 - 145 mmol/L   Potassium 4.0 3.5 - 5.1 mmol/L    Comment: HEMOLYSIS AT THIS LEVEL MAY AFFECT RESULT   Chloride 104 98 - 111 mmol/L   CO2 27 22 - 32 mmol/L   Glucose, Bld 147 (H) 70 - 99 mg/dL    Comment: Glucose reference range applies only to samples taken after fasting for at least 8 hours.   BUN 19 8 - 23 mg/dL   Creatinine, Ser 1.04 (H) 0.44 - 1.00 mg/dL   Calcium 7.0 (L) 8.9 - 10.3 mg/dL   Total Protein 5.1 (L) 6.5 - 8.1 g/dL   Albumin 2.6 (L) 3.5 - 5.0 g/dL   AST 32 15 - 41 U/L    Comment: HEMOLYSIS AT THIS LEVEL MAY AFFECT RESULT   ALT 16 0 - 44 U/L    Comment: HEMOLYSIS AT THIS LEVEL MAY AFFECT RESULT   Alkaline Phosphatase 59 38 - 126 U/L   Total Bilirubin 1.6 (H) 0.3 - 1.2 mg/dL    Comment: HEMOLYSIS  AT THIS LEVEL MAY AFFECT RESULT   GFR, Estimated 50 (L) >60 mL/min    Comment: (NOTE) Calculated using the CKD-EPI Creatinine Equation (2021)    Anion gap 7 5 - 15    Comment: Performed at [facility], [address] [place], [place] [address]  Troponin I (High Sensitivity)     Status: None   Collection Time: 08/13/21  1:32 PM  Result Value Ref Range   Troponin I (High Sensitivity) 17 <18 ng/L    Comment: (NOTE) Elevated high sensitivity troponin I (hsTnI) values and significant  changes across serial measurements may suggest ACS but many other  chronic and acute conditions are known to elevate hsTnI results.  Refer to the "Links" section for chest pain algorithms and additional  guidance. Performed at [facility], [address] [place], [place] [address]   Lactic acid, plasma     Status: None   Collection Time: 08/13/21  1:32 PM  Result Value Ref Range   Lactic Acid, Venous 1.5 0.5 - 1.9 mmol/L    Comment: Performed at [facility], [address] [place], [place] [address]  Blood culture (single)     Status: None   Collection Time: 08/13/21  1:32 PM   Specimen: BLOOD  Result Value Ref Range   Specimen Description BLOOD RIGHT ANTECUBITAL    Special Requests      BOTTLES DRAWN AEROBIC AND ANAEROBIC Blood Culture results may not be optimal due to an inadequate volume of blood received in culture bottles   Culture      NO GROWTH 5 DAYS Performed at [facility], [address], [place], [place] [address]    Report Status 08/18/2021 FINAL   Procalcitonin     Status: None   Collection Time: 08/13/21  1:32 PM  Result Value Ref Range   Procalcitonin 0.13 ng/mL    Comment:        Interpretation: PCT (Procalcitonin) <= 0.5 ng/mL: Systemic infection (sepsis) is not likely. Local bacterial infection is possible. (NOTE)       Sepsis PCT Algorithm           Lower Respiratory Tract                                      Infection PCT  Algorithm    ----------------------------     ----------------------------         PCT < 0.25 ng/mL                PCT < 0.10 ng/mL          Strongly encourage             Strongly discourage   discontinuation of antibiotics    initiation of antibiotics    ----------------------------     -----------------------------       PCT 0.25 - 0.50 ng/mL            PCT 0.10 - 0.25 ng/mL               OR       >80% decrease in PCT            Discourage initiation of                                            antibiotics      Encourage discontinuation           of antibiotics    ----------------------------     -----------------------------         PCT >= 0.50 ng/mL  PCT 0.26 - 0.50 ng/mL               AND        <80% decrease in PCT             Encourage initiation of                                             antibiotics       Encourage continuation           of antibiotics    ----------------------------     -----------------------------        PCT >= 0.50 ng/mL                  PCT > 0.50 ng/mL               AND         increase in PCT                  Strongly encourage                                      initiation of antibiotics    Strongly encourage escalation           of antibiotics                                     -----------------------------                                           PCT <= 0.25 ng/mL                                                 OR                                        > 80% decrease in PCT                                      Discontinue / Do not initiate                                             antibiotics  Performed at [facility], [address], [place], [place] [address]   Resp Panel by RT-PCR (Flu A&B, Covid) Nasopharyngeal Swab     Status: None   Collection Time: 08/13/21  2:06 PM   Specimen: Nasopharyngeal Swab; Nasopharyngeal(NP) swabs in vial transport medium  Result Value Ref Range   SARS Coronavirus 2 by RT  PCR NEGATIVE NEGATIVE    Comment: (NOTE) SARS-CoV-2 target nucleic acids are NOT DETECTED.  The SARS-CoV-2 RNA is  generally detectable in upper respiratory specimens during the acute phase of infection. The lowest concentration of SARS-CoV-2 viral copies this assay can detect is 138 copies/mL. A negative result does not preclude SARS-Cov-2 infection and should not be used as the sole basis for treatment or other patient management decisions. A negative result may occur with  improper specimen collection/handling, submission of specimen other than nasopharyngeal swab, presence of viral mutation(s) within the areas targeted by this assay, and inadequate number of viral copies(<138 copies/mL). A negative result must be combined with clinical observations, patient history, and epidemiological information. The expected result is Negative.  Fact Sheet for Patients:  EntrepreneurPulse.com.au  Fact Sheet for Healthcare Providers:  IncredibleEmployment.be  This test is no t yet approved or cleared by the [place] FDA and  has been authorized for detection and/or diagnosis of SARS-CoV-2 by FDA under an Emergency Use Authorization (EUA). This EUA will remain  in effect (meaning this test can be used) for the duration of the COVID-19 declaration under Section 564(b)(1) of the Act, 21 U.S.C.section 360bbb-3(b)(1), unless the authorization is terminated  or revoked sooner.       Influenza A by PCR NEGATIVE NEGATIVE   Influenza B by PCR NEGATIVE NEGATIVE    Comment: (NOTE) The Xpert Xpress SARS-CoV-2/FLU/RSV plus assay is intended as an aid in the diagnosis of influenza from Nasopharyngeal swab specimens and should not be used as a sole basis for treatment. Nasal washings and aspirates are unacceptable for Xpert Xpress SARS-CoV-2/FLU/RSV testing.  Fact Sheet for Patients: EntrepreneurPulse.com.au  Fact Sheet for Healthcare  Providers: IncredibleEmployment.be  This test is not yet approved or cleared by the [place] FDA and has been authorized for detection and/or diagnosis of SARS-CoV-2 by FDA under an Emergency Use Authorization (EUA). This EUA will remain in effect (meaning this test can be used) for the duration of the COVID-19 declaration under Section 564(b)(1) of the Act, 21 U.S.C. section 360bbb-3(b)(1), unless the authorization is terminated or revoked.  Performed at [facility], [address] [place], [place] [address]   Urinalysis, Complete w Microscopic     Status: None   Collection Time: 08/13/21  2:25 PM  Result Value Ref Range   Color, Urine YELLOW YELLOW   APPearance CLEAR CLEAR   Specific Gravity, Urine 1.015 1.005 - 1.030   pH 7.5 5.0 - 8.0   Glucose, UA NEGATIVE NEGATIVE mg/dL   Hgb urine dipstick NEGATIVE NEGATIVE   Bilirubin Urine NEGATIVE NEGATIVE   Ketones, ur NEGATIVE NEGATIVE mg/dL   Protein, ur NEGATIVE NEGATIVE mg/dL   Nitrite NEGATIVE NEGATIVE   Leukocytes,Ua NEGATIVE NEGATIVE   Squamous Epithelial / LPF 0-5 0 - 5   WBC, UA 0-5 0 - 5 WBC/hpf   RBC / HPF 0-5 0 - 5 RBC/hpf   Bacteria, UA NONE SEEN NONE SEEN    Comment: Performed at [facility], [address] [place], [place] [address]  Lactic acid, plasma     Status: None   Collection Time: 08/13/21  4:54 PM  Result Value Ref Range   Lactic Acid, Venous 1.0 0.5 - 1.9 mmol/L    Comment: Performed at [facility], [address] [place],  [address]  Brain natriuretic peptide     Status: Abnormal   Collection Time: 08/13/21  4:54 PM  Result Value Ref Range   B Natriuretic Peptide 152.7 (H) 0.0 - 100.0 pg/mL    Comment: Performed at [facility], [address] [place], [place]  [address]  Basic metabolic panel     Status: Abnormal   Collection Time: 08/14/21  4:39 AM  Result Value Ref Range   Sodium 140 135 - 145 mmol/L   Potassium 3.9 3.5 -  5.1 mmol/L   Chloride 101 98 - 111 mmol/L   CO2 30 22 - 32 mmol/L   Glucose, Bld 129 (H) 70 - 99 mg/dL    Comment: Glucose reference range applies only to samples taken after fasting for at least 8 hours.   BUN 23 8 - 23 mg/dL   Creatinine, Ser 1.26 (H) 0.44 - 1.00 mg/dL   Calcium 8.2 (L) 8.9 - 10.3 mg/dL   GFR, Estimated 40 (L) >60 mL/min    Comment: (NOTE) Calculated using the CKD-EPI Creatinine Equation (2021)    Anion gap 9 5 - 15    Comment: Performed at [facility], [address] [place], [place] [address]  CBC     Status: Abnormal   Collection Time: 08/14/21  4:39 AM  Result Value Ref Range   WBC 24.0 (H) 4.0 - 10.5 K/uL   RBC 4.32 3.87 - 5.11 MIL/uL   Hemoglobin 13.5 12.0 - 15.0 g/dL   HCT 40.7 36.0 - 46.0 %   MCV 94.2 80.0 - 100.0 fL   MCH 31.3 26.0 - 34.0 pg   MCHC 33.2 30.0 - 36.0 g/dL   RDW 14.9 11.5 - 15.5 %   Platelets 192 150 - 400 K/uL   nRBC 0.0 0.0 - 0.2 %    Comment: Performed at [facility], [address] [place], [place] [address]  Vitamin B1     Status: Abnormal   Collection Time: 08/14/21  4:39 AM  Result Value Ref Range   Vitamin B1 (Thiamine) 419.3 (H) 66.5 - 200.0 nmol/L    Comment: (NOTE) This test was developed and its performance characteristics determined by [facility]. It has not been cleared or approved by the Food and Drug Administration. Performed At: [facility] [place], [place] [id number] Rush Farmer MD [id number]   Vitamin B12     Status: None   Collection Time: 08/14/21  4:39 AM  Result Value Ref Range   Vitamin B-12 404 180 - 914 pg/mL    Comment: (NOTE) This assay is not validated for testing neonatal or myeloproliferative syndrome specimens for Vitamin B12 levels. Performed at [facility], [address], [place], [place] [address]   VITAMIN D 25 Hydroxy (Vit-D Deficiency, Fractures)     Status: None   Collection Time: 08/14/21  4:39 AM  Result Value Ref Range    Vit D, 25-Hydroxy 46.88 30 - 100 ng/mL    Comment: (NOTE) Vitamin D deficiency has been defined by the [place] practice guideline as a level of serum 25-OH  vitamin D less than 20 ng/mL (1,2). The Endocrine Society went on to  further define vitamin D insufficiency as a level between 21 and 29  ng/mL (2).  1. IOM (Institute of Medicine). 2010. Dietary reference intakes for  calcium and D. [place]: The [employer]. 2. Holick MF, Binkley [place], Bischoff-Ferrari HA, et al. Evaluation,  treatment, and prevention of vitamin D deficiency: an Endocrine  Society clinical practice guideline, JCEM. 2011 Jul; 96(7): 1911-30.  Performed at [facility], [place] [address], [place],  [address]   Phosphorus     Status: None   Collection Time: 08/14/21  4:39 AM  Result Value Ref Range   Phosphorus 4.5 2.5 - 4.6 mg/dL    Comment: Performed at [facility], [address] [place], [place] [address]  Magnesium     Status: None   Collection Time: 08/14/21  4:39 AM  Result Value Ref Range   Magnesium 2.1 1.7 - 2.4 mg/dL    Comment: Performed at [facility], [address] [place], [place] [address]  TSH     Status: None   Collection Time: 08/14/21  4:39 AM  Result Value Ref Range   TSH 0.459 0.350 - 4.500 uIU/mL    Comment: Performed by a 3rd Generation assay with a functional sensitivity of <=0.01 uIU/mL. Performed at [facility], [address] [place], [place] [address]   CBC with Differential/Platelet     Status: Abnormal   Collection Time: 08/15/21  4:24 AM  Result Value Ref Range   WBC 17.0 (H) 4.0 - 10.5 K/uL   RBC 4.10 3.87 - 5.11 MIL/uL   Hemoglobin 13.0 12.0 - 15.0 g/dL   HCT 38.5 36.0 - 46.0 %   MCV 93.9 80.0 - 100.0 fL   MCH 31.7 26.0 - 34.0 pg   MCHC 33.8 30.0 - 36.0 g/dL   RDW 14.4 11.5 - 15.5 %   Platelets 159 150 - 400 K/uL   nRBC 0.0 0.0 - 0.2 %   Neutrophils Relative % 94 %    Neutro Abs 16.1 (H) 1.7 - 7.7 K/uL   Lymphocytes Relative 4 %   Lymphs Abs 0.6 (L) 0.7 - 4.0 K/uL   Monocytes Relative 1 %   Monocytes Absolute 0.1 0.1 - 1.0 K/uL   Eosinophils Relative 0 %   Eosinophils Absolute 0.0 0.0 - 0.5 K/uL   Basophils Relative 0 %   Basophils Absolute 0.0 0.0 - 0.1 K/uL   Immature Granulocytes 1 %   Abs Immature Granulocytes 0.18 (H) 0.00 - 0.07 K/uL    Comment: Performed at [facility], [address] [place], [place] [address]  Comprehensive metabolic panel     Status: Abnormal   Collection Time: 08/15/21  4:24 AM  Result Value Ref Range   Sodium 138 135 - 145 mmol/L   Potassium 4.0 3.5 - 5.1 mmol/L   Chloride 99 98 - 111 mmol/L   CO2 31 22 - 32 mmol/L   Glucose, Bld 246 (H) 70 - 99 mg/dL    Comment: Glucose reference range applies only to samples taken after fasting for at least 8 hours.   BUN 31 (H) 8 - 23 mg/dL   Creatinine, Ser 1.46 (H) 0.44 - 1.00 mg/dL   Calcium 8.1 (L) 8.9 - 10.3 mg/dL   Total Protein 6.0 (L) 6.5 - 8.1 g/dL   Albumin 2.7 (L) 3.5 - 5.0 g/dL   AST 13 (L) 15 - 41 U/L   ALT 16 0 - 44 U/L   Alkaline Phosphatase 70 38 - 126 U/L   Total Bilirubin 0.9 0.3 - 1.2 mg/dL   GFR, Estimated 33 (L) >60 mL/min    Comment: (NOTE) Calculated using the CKD-EPI Creatinine Equation (2021)    Anion gap 8 5 - 15    Comment: Performed at [facility], [address], [place], [place] [address]  Magnesium     Status: None   Collection Time: 08/15/21  4:24 AM  Result Value Ref Range   Magnesium 2.2 1.7 - 2.4 mg/dL    Comment: Performed at [facility], [address] [place], [place] [address]  Glucose, capillary     Status: Abnormal   Collection Time: 08/15/21  7:22 AM  Result Value Ref Range   Glucose-Capillary 195 (H) 70 - 99 mg/dL    Comment: Glucose reference range applies only to samples taken after fasting for at least 8 hours.  CBC with Differential/Platelet     Status: Abnormal   Collection Time:  08/16/21  9:21 AM  Result Value Ref Range   WBC 15.3 (H) 4.0 - 10.5 K/uL   RBC 4.29 3.87 - 5.11 MIL/uL   Hemoglobin 13.0 12.0 - 15.0 g/dL   HCT 40.0 36.0 - 46.0 %   MCV 93.2 80.0 - 100.0 fL   MCH 30.3 26.0 - 34.0 pg   MCHC 32.5 30.0 - 36.0 g/dL   RDW 14.3 11.5 - 15.5 %   Platelets 189 150 - 400 K/uL   nRBC 0.0 0.0 - 0.2 %   Neutrophils Relative % 96 %   Neutro Abs 14.6 (H) 1.7 - 7.7 K/uL   Lymphocytes Relative 2 %   Lymphs Abs 0.4 (L) 0.7 - 4.0 K/uL   Monocytes Relative 1 %   Monocytes Absolute 0.2 0.1 - 1.0 K/uL   Eosinophils Relative 0 %   Eosinophils Absolute 0.0 0.0 - 0.5 K/uL   Basophils Relative 0 %   Basophils Absolute 0.0 0.0 - 0.1 K/uL   Immature Granulocytes 1 %   Abs Immature Granulocytes 0.12 (H) 0.00 - 0.07 K/uL    Comment: Performed at [facility], [address], [place], [place] [address]  Basic metabolic panel     Status: Abnormal   Collection Time: 08/16/21  9:21 AM  Result Value Ref Range   Sodium 137 135 - 145 mmol/L   Potassium 3.8 3.5 - 5.1 mmol/L   Chloride 98 98 - 111 mmol/L   CO2 30 22 - 32 mmol/L   Glucose, Bld 329 (H) 70 - 99 mg/dL    Comment: Glucose reference range applies only to samples taken after fasting for at least 8 hours.   BUN 42 (H) 8 - 23 mg/dL   Creatinine, Ser 1.41 (H) 0.44 - 1.00 mg/dL   Calcium 8.1 (L) 8.9 - 10.3 mg/dL   GFR, Estimated 35 (L) >60 mL/min    Comment: (NOTE) Calculated using the CKD-EPI Creatinine Equation (2021)    Anion gap 9 5 - 15    Comment: Performed at [facility], [address] [place], [place] [address]  Magnesium     Status: Abnormal   Collection Time: 08/16/21  9:21 AM  Result Value Ref Range   Magnesium 1.6 (L) 1.7 - 2.4 mg/dL    Comment: Performed at [facility], [address] [place], [place] [address]  Glucose, capillary     Status: Abnormal   Collection Time: 08/16/21 11:14 AM  Result Value Ref Range   Glucose-Capillary 342 (H) 70 - 99 mg/dL    Comment:  Glucose reference range applies only to samples taken after fasting for at least 8 hours.  Glucose, capillary     Status: Abnormal   Collection Time: 08/16/21  4:32 PM  Result Value Ref Range   Glucose-Capillary 309 (H) 70 - 99 mg/dL    Comment: Glucose reference range applies only to samples taken after fasting for at least 8 hours.  Glucose, capillary     Status: Abnormal   Collection Time: 08/16/21  7:52 PM  Result Value Ref Range   Glucose-Capillary 296 (H)  70 - 99 mg/dL    Comment: Glucose reference range applies only to samples taken after fasting for at least 8 hours.  CBC with Differential/Platelet     Status: Abnormal   Collection Time: 08/17/21  3:04 AM  Result Value Ref Range   WBC 10.7 (H) 4.0 - 10.5 K/uL   RBC 4.10 3.87 - 5.11 MIL/uL   Hemoglobin 13.0 12.0 - 15.0 g/dL   HCT 38.0 36.0 - 46.0 %   MCV 92.7 80.0 - 100.0 fL   MCH 31.7 26.0 - 34.0 pg   MCHC 34.2 30.0 - 36.0 g/dL   RDW 14.2 11.5 - 15.5 %   Platelets 160 150 - 400 K/uL   nRBC 0.0 0.0 - 0.2 %   Neutrophils Relative % 92 %   Neutro Abs 9.9 (H) 1.7 - 7.7 K/uL   Lymphocytes Relative 4 %   Lymphs Abs 0.5 (L) 0.7 - 4.0 K/uL   Monocytes Relative 3 %   Monocytes Absolute 0.3 0.1 - 1.0 K/uL   Eosinophils Relative 0 %   Eosinophils Absolute 0.0 0.0 - 0.5 K/uL   Basophils Relative 0 %   Basophils Absolute 0.0 0.0 - 0.1 K/uL   Immature Granulocytes 1 %   Abs Immature Granulocytes 0.08 (H) 0.00 - 0.07 K/uL    Comment: Performed at [facility], [address], [place], [place] [address]  Basic metabolic panel     Status: Abnormal   Collection Time: 08/17/21  3:04 AM  Result Value Ref Range   Sodium 140 135 - 145 mmol/L   Potassium 4.2 3.5 - 5.1 mmol/L   Chloride 102 98 - 111 mmol/L   CO2 31 22 - 32 mmol/L   Glucose, Bld 295 (H) 70 - 99 mg/dL    Comment: Glucose reference range applies only to samples taken after fasting for at least 8 hours.   BUN 46 (H) 8 - 23 mg/dL   Creatinine, Ser 1.36 (H) 0.44 -  1.00 mg/dL   Calcium 8.0 (L) 8.9 - 10.3 mg/dL   GFR, Estimated 36 (L) >60 mL/min    Comment: (NOTE) Calculated using the CKD-EPI Creatinine Equation (2021)    Anion gap 7 5 - 15    Comment: Performed at [facility], [address] [place], [place] [address]  Magnesium     Status: Abnormal   Collection Time: 08/17/21  3:04 AM  Result Value Ref Range   Magnesium 2.6 (H) 1.7 - 2.4 mg/dL    Comment: Performed at [facility], [address] [place], [place] [address]  Phosphorus     Status: None   Collection Time: 08/17/21  3:04 AM  Result Value Ref Range   Phosphorus 3.4 2.5 - 4.6 mg/dL    Comment: Performed at [facility], [address] [place], [place] [address]  Glucose, capillary     Status: Abnormal   Collection Time: 08/17/21  7:33 AM  Result Value Ref Range   Glucose-Capillary 241 (H) 70 - 99 mg/dL    Comment: Glucose reference range applies only to samples taken after fasting for at least 8 hours.  Glucose, capillary     Status: Abnormal   Collection Time: 08/17/21 11:41 AM  Result Value Ref Range   Glucose-Capillary 296 (H) 70 - 99 mg/dL    Comment: Glucose reference range applies only to samples taken after fasting for at least 8 hours.  Glucose, capillary     Status: Abnormal   Collection Time: 08/17/21  5:42 PM  Result Value Ref Range   Glucose-Capillary 350 (H) 70 - 99 mg/dL    Comment: Glucose reference range applies only to samples taken after fasting for at least 8 hours.  Glucose, capillary     Status: Abnormal   Collection Time: 08/17/21  8:51 PM  Result Value Ref Range   Glucose-Capillary 205 (H) 70 - 99 mg/dL    Comment: Glucose reference range applies only to samples taken after fasting for at least 8 hours.  Basic metabolic panel     Status: Abnormal   Collection Time: 08/18/21  3:44 AM  Result Value Ref Range   Sodium 141 135 - 145 mmol/L   Potassium 4.0 3.5 - 5.1 mmol/L   Chloride 100 98 - 111 mmol/L   CO2 32 22 - 32  mmol/L   Glucose, Bld 148 (H) 70 - 99 mg/dL    Comment: Glucose reference range applies only to samples taken after fasting for at least 8 hours.   BUN 42 (H) 8 - 23 mg/dL   Creatinine, Ser 1.03 (H) 0.44 - 1.00 mg/dL   Calcium 8.0 (L) 8.9 - 10.3 mg/dL   GFR, Estimated 51 (L) >60 mL/min    Comment: (NOTE) Calculated using the CKD-EPI Creatinine Equation (2021)    Anion gap 9 5 - 15    Comment: Performed at [facility], [place] [address] [place], [place] [address]  CBC     Status: None   Collection Time: 08/18/21  3:44 AM  Result Value Ref Range   WBC 9.9 4.0 - 10.5 K/uL   RBC 4.11 3.87 - 5.11 MIL/uL   Hemoglobin 12.4 12.0 - 15.0 g/dL   HCT 38.2 36.0 - 46.0 %   MCV 92.9 80.0 - 100.0 fL   MCH 30.2 26.0 - 34.0 pg   MCHC 32.5 30.0 - 36.0 g/dL   RDW 14.0 11.5 - 15.5 %   Platelets 161 150 - 400 K/uL   nRBC 0.0 0.0 - 0.2 %    Comment: Performed at [facility], [address], [place], [place] [address]  Magnesium     Status: None   Collection Time: 08/18/21  3:44 AM  Result Value Ref Range   Magnesium 2.2 1.7 - 2.4 mg/dL    Comment: Performed at [facility], [address] [place], [place] [address]  Phosphorus     Status: None   Collection Time: 08/18/21  3:44 AM  Result Value Ref Range   Phosphorus 2.9 2.5 - 4.6 mg/dL    Comment: Performed at [facility], [address] [place], [place] [address]  Glucose, capillary     Status: Abnormal   Collection Time: 08/18/21  8:43 AM  Result Value Ref Range   Glucose-Capillary 171 (H) 70 - 99 mg/dL    Comment: Glucose reference range applies only to samples taken after fasting for at least 8 hours.  Glucose, capillary     Status: Abnormal   Collection Time: 08/18/21 12:01 PM  Result Value Ref Range   Glucose-Capillary 105 (H) 70 - 99 mg/dL    Comment: Glucose reference range applies only to samples taken after fasting for at least 8 hours.  Glucose, capillary     Status: Abnormal   Collection  Time: 08/18/21  7:31 PM  Result Value Ref Range   Glucose-Capillary 380 (H) 70 - 99 mg/dL    Comment: Glucose reference range applies only to samples taken after fasting for at least 8 hours.  Glucose, capillary     Status: Abnormal   Collection Time: 08/18/21 10:20 PM  Result Value Ref Range   Glucose-Capillary 319 (H) 70 - 99 mg/dL    Comment: Glucose reference range applies only to samples taken after fasting for at least 8 hours.  Basic metabolic panel     Status: Abnormal   Collection Time: 08/19/21  6:06 AM  Result Value Ref Range   Sodium 140 135 - 145 mmol/L   Potassium 4.2 3.5 - 5.1 mmol/L   Chloride 104 98 - 111 mmol/L   CO2 31 22 - 32 mmol/L   Glucose, Bld 192 (H) 70 - 99 mg/dL    Comment: Glucose reference range applies only to samples taken after fasting for at least 8 hours.   BUN 34 (H) 8 - 23 mg/dL   Creatinine, Ser 0.98 0.44 - 1.00 mg/dL   Calcium 8.0 (L) 8.9 - 10.3 mg/dL   GFR, Estimated 54 (L) >60 mL/min    Comment: (NOTE) Calculated using the CKD-EPI Creatinine Equation (2021)    Anion gap 5 5 - 15    Comment: Performed at [facility], [address] [place], [place] [address]  CBC     Status: None   Collection Time: 08/19/21  6:06 AM  Result Value Ref Range   WBC 9.2 4.0 - 10.5 K/uL   RBC 4.05 3.87 - 5.11 MIL/uL   Hemoglobin 12.7 12.0 - 15.0 g/dL   HCT 37.6 36.0 - 46.0 %   MCV 92.8 80.0 - 100.0 fL   MCH 31.4 26.0 - 34.0 pg   MCHC 33.8 30.0 - 36.0 g/dL   RDW 14.1 11.5 - 15.5 %   Platelets 158 150 - 400 K/uL   nRBC 0.0 0.0 - 0.2 %    Comment: Performed at [facility], [address], [place], [place] [address]  Magnesium     Status: None   Collection Time: 08/19/21  6:06 AM  Result Value Ref Range   Magnesium 2.2 1.7 - 2.4 mg/dL    Comment: Performed at [facility], [address] [place], [place] [address]  Phosphorus     Status: None   Collection Time: 08/19/21  6:06 AM  Result Value Ref Range   Phosphorus 2.9  2.5 - 4.6 mg/dL    Comment: Performed at [facility], [address] [place], [place] [address]  Glucose, capillary     Status: Abnormal   Collection Time: 08/19/21  7:37 AM  Result Value Ref Range   Glucose-Capillary 187 (H) 70 - 99 mg/dL    Comment: Glucose reference range applies only to samples taken after fasting for at least 8 hours.  Glucose, capillary     Status: Abnormal   Collection Time: 08/19/21 11:26 AM  Result Value Ref Range   Glucose-Capillary 208 (H) 70 - 99 mg/dL    Comment: Glucose reference range applies only to samples taken after fasting for at least 8 hours.  Urine Culture     Status: None   Collection Time: 09/08/21 10:13 AM   Specimen: Urine  Result Value Ref Range   MICRO NUMBER: [id number]    SPECIMEN QUALITY: Adequate    Sample Source NOT GIVEN    STATUS: FINAL    Result:      Mixed genital flora isolated. These superficial bacteria are not indicative of a urinary tract infection. No further organism identification is warranted on this specimen. If clinically indicated, recollect clean-catch, mid-stream urine and  transfer  immediately to Urine Culture Transport Tube.   Urinalysis, Routine w reflex microscopic     Status: Abnormal   Collection Time: 09/08/21 10:13 AM  Result Value Ref Range   Color, Urine DARK YELLOW YELLOW   APPearance TURBID (A) CLEAR   Specific Gravity, Urine 1.015 1.001 - 1.035   pH 6.5 5.0 - 8.0   Glucose, UA NEGATIVE NEGATIVE   Bilirubin Urine NEGATIVE NEGATIVE   Ketones, ur NEGATIVE NEGATIVE   Hgb urine dipstick TRACE (A) NEGATIVE   Protein, ur NEGATIVE NEGATIVE   Nitrite NEGATIVE NEGATIVE   Leukocytes,Ua 2+ (A) NEGATIVE   WBC, UA 0-5 0 - 5 /HPF   RBC / HPF 3-10 (A) 0 - 2 /HPF   Squamous Epithelial / LPF 10-20 (A) < OR = 5 /HPF   Bacteria, UA MODERATE (A) NONE SEEN /HPF   Calcium Oxalate Crystal MODERATE (A) NONE OR FEW /HPF   Hyaline Cast 0-5 (A) NONE SEEN /LPF  MICROSCOPIC MESSAGE     Status: None   Collection  Time: 09/08/21 10:13 AM  Result Value Ref Range   Note      Comment: This urine was analyzed for the presence of WBC,  RBC, bacteria, casts, and other formed elements.  Only those elements seen were reported. . .   Lipid panel     Status: None   Collection Time: 09/29/21  2:32 PM  Result Value Ref Range   Cholesterol 131 0 - 200 mg/dL    Comment: ATP III Classification       Desirable:  < 200 mg/dL               Borderline High:  200 - 239 mg/dL          High:  > = 240 mg/dL   Triglycerides 125.0 0.0 - 149.0 mg/dL    Comment: Normal:  <150 mg/dLBorderline High:  150 - 199 mg/dL   HDL 43.70 >39.00 mg/dL   VLDL 25.0 0.0 - 40.0 mg/dL   LDL Cholesterol 62 0 - 99 mg/dL   Total CHOL/HDL Ratio 3     Comment:                Men          Women1/2 Average Risk     3.4          3.3Average Risk          5.0          4.42X Average Risk          9.6          7.13X Average Risk          15.0          11.0                       NonHDL 86.84     Comment: NOTE:  Non-HDL goal should be 30 mg/dL higher than patient's LDL goal (i.e. LDL goal of < 70 mg/dL, would have non-HDL goal of < 100 mg/dL)  CBC with Differential/Platelet     Status: None   Collection Time: 09/29/21  2:32 PM  Result Value Ref Range   WBC 7.2 4.0 - 10.5 K/uL   RBC 4.60 3.87 - 5.11 Mil/uL   Hemoglobin 13.5 12.0 - 15.0 g/dL   HCT 42.5 36.0 - 46.0 %   MCV 92.4 78.0 - 100.0 fl   MCHC 31.9 30.0 - 36.0  g/dL   RDW 14.9 11.5 - 15.5 %   Platelets 220.0 150.0 - 400.0 K/uL   Neutrophils Relative % 64.2 43.0 - 77.0 %   Lymphocytes Relative 24.2 12.0 - 46.0 %   Monocytes Relative 9.4 3.0 - 12.0 %   Eosinophils Relative 1.6 0.0 - 5.0 %   Basophils Relative 0.6 0.0 - 3.0 %   Neutro Abs 4.6 1.4 - 7.7 K/uL   Lymphs Abs 1.7 0.7 - 4.0 K/uL   Monocytes Absolute 0.7 0.1 - 1.0 K/uL   Eosinophils Absolute 0.1 0.0 - 0.7 K/uL   Basophils Absolute 0.0 0.0 - 0.1 K/uL  TSH     Status: None   Collection Time: 09/29/21  2:32 PM  Result Value Ref  Range   TSH 1.46 0.35 - 5.50 uIU/mL  Comprehensive metabolic panel     Status: Abnormal   Collection Time: 09/29/21  2:32 PM  Result Value Ref Range   Sodium 144 135 - 145 mEq/L   Potassium 3.6 3.5 - 5.1 mEq/L   Chloride 99 96 - 112 mEq/L   CO2 37 (H) 19 - 32 mEq/L   Glucose, Bld 144 (H) 70 - 99 mg/dL   BUN 19 6 - 23 mg/dL   Creatinine, Ser 1.20 0.40 - 1.20 mg/dL   Total Bilirubin 0.5 0.2 - 1.2 mg/dL   Alkaline Phosphatase 62 39 - 117 U/L   AST 20 0 - 37 U/L   ALT 14 0 - 35 U/L   Total Protein 6.7 6.0 - 8.3 g/dL   Albumin 3.5 3.5 - 5.2 g/dL   GFR 39.04 (L) >60.00 mL/min    Comment: Calculated using the CKD-EPI Creatinine Equation (2021)   Calcium 8.9 8.4 - 10.5 mg/dL   Objective  Body mass index is 30.23 kg/m. Wt Readings from Last 3 Encounters:  09/29/21 193 lb (87.5 kg)  08/13/21 207 lb 3.7 oz (94 kg)  08/02/21 223 lb 15.8 oz (101.6 kg)   Temp Readings from Last 3 Encounters:  09/29/21 97.6 F (36.4 C) (Temporal)  08/19/21 98 F (36.7 C)  08/05/21 98 F (36.7 C) (Oral)   BP Readings from Last 3 Encounters:  09/29/21 122/70  08/19/21 (!) 124/50  08/05/21 122/60   Pulse Readings from Last 3 Encounters:  09/29/21 86  08/19/21 66  08/05/21 60    Physical Exam Vitals and nursing note reviewed.  Constitutional:      Appearance: Normal appearance. She is well-developed and well-groomed.  HENT:     Head: Normocephalic and atraumatic.  Eyes:     Conjunctiva/sclera: Conjunctivae normal.     Pupils: Pupils are equal, round, and reactive to light.  Cardiovascular:     Rate and Rhythm: Normal rate and regular rhythm.     Heart sounds: Normal heart sounds. No murmur heard. Skin:    General: Skin is warm and dry.  Neurological:     General: No focal deficit present.     Mental Status: She is alert and oriented to person, place, and time. Mental status is at baseline.     Gait: Gait normal.     Comments: In wheelchair today  Psychiatric:        Attention and  Perception: Attention and perception normal.        Mood and Affect: Mood and affect normal.        Speech: Speech normal.        Behavior: Behavior normal. Behavior is cooperative.  Thought Content: Thought content normal.        Cognition and Memory: Cognition and memory normal.    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 Family wants to keep pt at home and not SNF she does better with family at home and 24/7 supervision Lumbar arthritis Neuropathy ? On gabapentin 200 mg tid already for trigeminal neuralgia dose was reduced from 400 tid due to fatigue/sedation Rx manual wheelchair, hospital bed, toilet seat elevator Can take otc tylenol 500 mg (2 pills) bid to tid prn and if needed for pain I can Rx tylenol #3 has tolerated in the past  Mobility assessment Deconditioning doing pt 1x per week now family does not want to put her in a home and increase PT to 3x per week and add OT [facility] Latino with [facility]  C/o unable to use rollator with seat due to b/l leg pain h/o b/l dvt today she is in wheelchair, spinal stenosis and lumbar arthritis  -->needs Rx and needs Rx hospital bed with foam and gel overlay, manuel  wheelchair and toilet seat elevator and wants home PT/oT 3x per week family does not want SNF   Type 2 diabetes mellitus without complication, without long-term  current use of insulin ([place]) - Plan: Lipid panel, Ambulatory referral to [facility] No need for januvia, metformin due to kidney function  A1c 6.3   Hypothyroidism, unspecified type - Plan: TSH continue medication at current dose  HM See last note  Flu shot given today   Provider: Dr. Olivia Mackie McLean-Scocuzza-[facility]

## 2021-09-29 NOTE — Patient Instructions (Addendum)
Results for Jessica Erickson, Jessica Erickson (MRN [id number]) as of 09/29/2021 13:46  Ref. Range 08/02/2021 04:13  Hemoglobin A1C Latest Ref Range: 4.8 - 5.6 % 6.3 (H)   Lidocaine pain patch 4% areas of pain  Premier protein   Do you want to do MRI low back and check the circulation in your legs   [facility] [place]  Use flutter valve at least 2x per lungs

## 2021-09-30 ENCOUNTER — Ambulatory Visit (INDEPENDENT_AMBULATORY_CARE_PROVIDER_SITE_OTHER): Payer: Medicare PPO

## 2021-09-30 VITALS — Ht 67.0 in | Wt 193.0 lb

## 2021-09-30 DIAGNOSIS — M1990 Unspecified osteoarthritis, unspecified site: Secondary | ICD-10-CM | POA: Insufficient documentation

## 2021-09-30 DIAGNOSIS — Z0000 Encounter for general adult medical examination without abnormal findings: Secondary | ICD-10-CM | POA: Diagnosis not present

## 2021-09-30 DIAGNOSIS — M79604 Pain in right leg: Secondary | ICD-10-CM | POA: Insufficient documentation

## 2021-09-30 DIAGNOSIS — M47817 Spondylosis without myelopathy or radiculopathy, lumbosacral region: Secondary | ICD-10-CM | POA: Insufficient documentation

## 2021-09-30 DIAGNOSIS — M48061 Spinal stenosis, lumbar region without neurogenic claudication: Secondary | ICD-10-CM | POA: Insufficient documentation

## 2021-09-30 NOTE — Patient Instructions (Addendum)
Jessica Erickson , Thank you for taking time to come for your Medicare Wellness Visit. I appreciate your ongoing commitment to your health goals. Please review the following plan we discussed and let me know if I can assist you in the future.   These are the goals we discussed:  Goals       Follow up with Primary Care Provider      As needed      Walk again (pt-stated)      PT/OT exercise.          Advanced directives: Yes. Copy on file.  Conditions/risks identified: None  Next appointment: Follow up in one year for your annual wellness visit Yes.   Preventive Care 80 Years and Older, Female Preventive care refers to lifestyle choices and visits with your health care provider that can promote health and wellness. What does preventive care include? A yearly physical exam. This is also called an annual well check. Dental exams once or twice a year. Routine eye exams. Ask your health care provider how often you should have your eyes checked. Personal lifestyle choices, including: Daily care of your teeth and gums. Regular physical activity. Eating a healthy diet. Avoiding tobacco and drug use. Limiting alcohol use. Practicing safe sex. Taking low-dose aspirin every day. Taking vitamin and mineral supplements as recommended by your health care provider. What happens during an annual well check? The services and screenings done by your health care provider during your annual well check will depend on your age, overall health, lifestyle risk factors, and family history of disease. Counseling  Your health care provider may ask you questions about your: Alcohol use. Tobacco use. Drug use. Emotional well-being. Home and relationship well-being. Sexual activity. Eating habits. History of falls. Memory and ability to understand (cognition). Work and work Statistician. Reproductive health. Screening  You may have the following tests or measurements: Height, weight, and  BMI. Blood pressure. Lipid and cholesterol levels. These may be checked every 5 years, or more frequently if you are over 52 years old. Skin check. Lung cancer screening. You may have this screening every year starting at age 64 if you have a 30-pack-year history of smoking and currently smoke or have quit within the past 15 years. Fecal occult blood test (FOBT) of the stool. You may have this test every year starting at age 43. Flexible sigmoidoscopy or colonoscopy. You may have a sigmoidoscopy every 5 years or a colonoscopy every 10 years starting at age 65. Hepatitis C blood test. Hepatitis B blood test. Sexually transmitted disease (STD) testing. Diabetes screening. This is done by checking your blood sugar (glucose) after you have not eaten for a while (fasting). You may have this done every 1-3 years. Bone density scan. This is done to screen for osteoporosis. You may have this done starting at age 63. Mammogram. This may be done every 1-2 years. Talk to your health care provider about how often you should have regular mammograms. Talk with your health care provider about your test results, treatment options, and if necessary, the need for more tests. Vaccines  Your health care provider may recommend certain vaccines, such as: Influenza vaccine. This is recommended every year. Tetanus, diphtheria, and acellular pertussis (Tdap, Td) vaccine. You may need a Td booster every 10 years. Zoster vaccine. You may need this after age 60. Pneumococcal 13-valent conjugate (PCV13) vaccine. One dose is recommended after age 9. Pneumococcal polysaccharide (PPSV23) vaccine. One dose is recommended after age 18. Talk to  your health care provider about which screenings and vaccines you need and how often you need them. This information is not intended to replace advice given to you by your health care provider. Make sure you discuss any questions you have with your health care provider. Document  Released: 12/17/2015 Document Revised: 08/09/2016 Document Reviewed: 09/21/2015 Elsevier Interactive Patient Education  2017 [place] Prevention in the Home Falls can cause injuries. They can happen to people of all ages. There are many things you can do to make your home safe and to help prevent falls. What can I do on the outside of my home? Regularly fix the edges of walkways and driveways and fix any cracks. Remove anything that might make you trip as you walk through a door, such as a raised step or threshold. Trim any bushes or trees on the path to your home. Use bright outdoor lighting. Clear any walking paths of anything that might make someone trip, such as rocks or tools. Regularly check to see if handrails are loose or broken. Make sure that both sides of any steps have handrails. Any raised decks and porches should have guardrails on the edges. Have any leaves, snow, or ice cleared regularly. Use sand or salt on walking paths during winter. Clean up any spills in your garage right away. This includes oil or grease spills. What can I do in the bathroom? Use night lights. Install grab bars by the toilet and in the tub and shower. Do not use towel bars as grab bars. Use non-skid mats or decals in the tub or shower. If you need to sit down in the shower, use a plastic, non-slip stool. Keep the floor dry. Clean up any water that spills on the floor as soon as it happens. Remove soap buildup in the tub or shower regularly. Attach bath mats securely with double-sided non-slip rug tape. Do not have throw rugs and other things on the floor that can make you trip. What can I do in the bedroom? Use night lights. Make sure that you have a light by your bed that is easy to reach. Do not use any sheets or blankets that are too big for your bed. They should not hang down onto the floor. Have a firm chair that has side arms. You can use this for support while you get dressed. Do  not have throw rugs and other things on the floor that can make you trip. What can I do in the kitchen? Clean up any spills right away. Avoid walking on wet floors. Keep items that you use a lot in easy-to-reach places. If you need to reach something above you, use a strong step stool that has a grab bar. Keep electrical cords out of the way. Do not use floor polish or wax that makes floors slippery. If you must use wax, use non-skid floor wax. Do not have throw rugs and other things on the floor that can make you trip. What can I do with my stairs? Do not leave any items on the stairs. Make sure that there are handrails on both sides of the stairs and use them. Fix handrails that are broken or loose. Make sure that handrails are as long as the stairways. Check any carpeting to make sure that it is firmly attached to the stairs. Fix any carpet that is loose or worn. Avoid having throw rugs at the top or bottom of the stairs. If you do have throw rugs, attach  them to the floor with carpet tape. Make sure that you have a light switch at the top of the stairs and the bottom of the stairs. If you do not have them, ask someone to add them for you. What else can I do to help prevent falls? Wear shoes that: Do not have high heels. Have rubber bottoms. Are comfortable and fit you well. Are closed at the toe. Do not wear sandals. If you use a stepladder: Make sure that it is fully opened. Do not climb a closed stepladder. Make sure that both sides of the stepladder are locked into place. Ask someone to hold it for you, if possible. Clearly mark and make sure that you can see: Any grab bars or handrails. First and last steps. Where the edge of each step is. Use tools that help you move around (mobility aids) if they are needed. These include: Canes. Walkers. Scooters. Crutches. Turn on the lights when you go into a dark area. Replace any light bulbs as soon as they burn out. Set up your  furniture so you have a clear path. Avoid moving your furniture around. If any of your floors are uneven, fix them. If there are any pets around you, be aware of where they are. Review your medicines with your doctor. Some medicines can make you feel dizzy. This can increase your chance of falling. Ask your doctor what other things that you can do to help prevent falls. This information is not intended to replace advice given to you by your health care provider. Make sure you discuss any questions you have with your health care provider. Document Released: 09/16/2009 Document Revised: 04/27/2016 Document Reviewed: 12/25/2014 Elsevier Interactive Patient Education  2017 [employer].

## 2021-09-30 NOTE — Progress Notes (Signed)
Subjective:   Jessica Erickson is a 87 y.o. female who presents for Medicare Annual (Subsequent) preventive examination.  Review of Systems    No ROS Cardiac Risk Factors include: advanced age (>35mn, >>46women);diabetes mellitus;hypertension  Diabetes- followed by pcp. BS checked daily.   Diabetic Exams:  Diabetic Eye Exam: Completed Yes. Diabetic Foot Exam: Completed Yes..      Objective:    Today's Vitals   09/30/21 1409  Weight: 193 lb (87.5 kg)  Height: '5\' 7"'  (1.702 m)   Body mass index is 30.23 kg/m.  Advanced Directives 09/30/2021 08/19/2021 08/13/2021 08/02/2021 08/01/2021 06/08/2021 04/26/2021  Does Patient Have a Medical Advance Directive? Yes No No - No No No  Type of AParamedicof ABasyeLiving will - - - - - -  Does patient want to make changes to medical advance directive? No - Patient declined - - - - - -  Copy of [place]? Yes - validated most recent copy scanned in chart (See row information) - - - - - -  Would patient like information on creating a medical advance directive? - No - Patient declined No - Patient declined No - Patient declined - - No - Patient declined    Current Medications (verified) Outpatient Encounter Medications as of 09/30/2021  Medication Sig   Accu-Chek Softclix Lancets lancets Use as instructed   acetaminophen (TYLENOL) 500 MG tablet Take 2 tablets (1,000 mg total) by mouth 2 (two) times daily as needed.   acetaminophen-codeine (TYLENOL #3) 300-30 MG tablet Take 1 tablet by mouth 2 (two) times daily as needed for moderate pain.   albuterol (PROVENTIL) (2.5 MG/3ML) 0.083% nebulizer solution Take 3 mLs (2.5 mg total) by nebulization in the morning, at noon, and at bedtime.   apixaban (ELIQUIS) 2.5 MG TABS tablet Take 1 tablet (2.5 mg total) by mouth 2 (two) times daily.   atorvastatin (LIPITOR) 40 MG tablet Take 1 tablet (40 mg total) by mouth daily at 6 PM. Generic ok (Patient taking  differently: Take 40 mg by mouth daily. Generic ok)   BISACODYL PO Take by mouth.   Blood Glucose Monitoring Suppl (ACCU-CHEK AVIVA PLUS) w/Device KIT Use as directed   Calcium Carbonate-Vitamin D (CALCIUM 600+D) 600-400 MG-UNIT tablet Take 1 tablet by mouth 2 (two) times daily. Lunch and dinner   cetirizine (ZYRTEC) 10 MG tablet Take 1 tablet (10 mg total) by mouth daily.   clotrimazole-betamethasone (LOTRISONE) lotion Apply 1 application topically 2 (two) times daily.   Docusate Sodium (DOK PO) Take 1 tablet by mouth daily.   feeding supplement, GLUCERNA SHAKE, (GLUCERNA SHAKE) LIQD Take 237 mLs by mouth 2 (two) times daily between meals.   Fluticasone-Umeclidin-Vilant (TRELEGY ELLIPTA) 100-62.5-25 MCG/INH AEPB Inhale 1 puff into the lungs daily. Rinse mouth d/c symbicort and spiriva   gabapentin (NEURONTIN) 100 MG capsule Take 2 capsules (200 mg total) by mouth 3 (three) times daily.   glucose blood test strip Use to test blood sugar twice daily.   guaiFENesin (MUCINEX) 600 MG 12 hr tablet Take 2 tablets (1,200 mg total) by mouth 2 (two) times daily as needed for cough or to loosen phlegm.   levothyroxine (SYNTHROID) 200 MCG tablet Take 1 tablet (200 mcg total) by mouth daily before breakfast. Except on Sunday. Do not take with other medications or vitamins   Melatonin 10 MG TABS Take 10 mg by mouth at bedtime.   mirtazapine (REMERON) 15 MG tablet Take 1 tablet (15  mg total) by mouth at bedtime.   Multiple Vitamin (MULTIVITAMIN WITH MINERALS) TABS tablet Take 1 tablet by mouth daily.    pantoprazole (PROTONIX) 40 MG tablet Take 1 tablet (40 mg total) by mouth daily. 30 minutes before lunch or dinner   polyethylene glycol powder (GLYCOLAX/MIRALAX) 17 GM/SCOOP powder Take 17 g by mouth daily.   polyvinyl alcohol (LIQUIFILM TEARS) 1.4 % ophthalmic solution Place 1 drop into both eyes at bedtime.   No facility-administered encounter medications on file as of 09/30/2021.    Allergies  (verified) Penicillins, Sulfa antibiotics, Amitiza [lubiprostone], Aspirin, and Penicillin g   History: Past Medical History:  Diagnosis Date   Acoustic neuroma (HCC)    Allergy    Asthma    Bilateral swelling of feet    and legs   Bladder infection    CAD (coronary artery disease)    Cataract    Change in voice    Compression fracture of body of thoracic vertebra (HCC)    T12 09/18/15 MRI s/p fall    Constipation    COPD (chronic obstructive pulmonary disease) (HCC)    previous CXR with chronic interstitial lung dz    CVA (cerebral vascular accident) ([place])    Depression    Diabetes ([place])    with neuropathy   Diabetes mellitus, type 2 ([place])    Diarrhea    Double vision    DVT (deep venous thrombosis) ([place])    right leg 10/2015 was on coumadin off as of 2017/2018 ; s/p IVC filter   Enuresis    Eye pain, right    Fall    Fatty liver    09/15/15 also mildly dilated pancreatitic duct rec MRCP small sub cm cyst hemangioma speeln mild right hydronephrorossi and prox. hydroureter, kidney stones, mild scarring kidneys   Female stress incontinence    Flank pain    GERD (gastroesophageal reflux disease)    with small hiatal hernia    Hard of hearing    Heart disease    History of kidney problems    Hyperlipidemia    mixed   Hypertension    Hypothyroidism, postsurgical    Impaired mobility and ADLs    uses rolling walker has caretaker 24/7 at home   Leg edema    Mixed incontinence urge and stress (female)(female)    Neuropathy    Osteoarthritis    DDD spine    Osteoporosis with fracture    T12 compression fracture   Photophobia    Pulmonary embolism ([place])    10/2015 off coumadin as of 04/2016   Pulmonary HTN (HCC)    mild pulm HTN, echo 10/09/15 EF 95-62%ZHYQM 1 dd, RV systolic pressure increased    Recurrent UTI    Sinus pressure    Skin cancer    BCC jawline and scalp    Thyroid disease    follows [facility]   TIA (transient ischemic attack)    MRI 2009/2010 neg  stroke    Trigeminal neuralgia    Dr. Tomi Bamberger s/p gamma knife x 2, on Tegretol since 2011/2012 no increase in dose >200 mg bid rec per family per neurology    Urinary frequency    Urinary, incontinence, stress female    Dr Erlene Quan urology    Past Surgical History:  Procedure Laterality Date   APPENDECTOMY     as a child, open   BRAIN SURGERY     schwnnoma removal 1996    brain tumor surgery  BREAST SURGERY     breast bx   CATARACT EXTRACTION     CHOLECYSTECTOMY     EYE SURGERY     cataract   IVC FILTER PLACEMENT ([place] HX)     Dr. Lucky Cowboy 10/2015    LAPAROSCOPIC TUBAL LIGATION     MOHS SURGERY     scalp 04/2014    PERIPHERAL VASCULAR CATHETERIZATION N/A 10/11/2015   Procedure: IVC Filter Insertion;  Surgeon: Algernon Huxley, MD;  Location: [facility];  Service: Cardiovascular;  Laterality: N/A;   PERIPHERAL VASCULAR THROMBECTOMY Bilateral 03/29/2018   Procedure: PERIPHERAL VASCULAR THROMBECTOMY;  Surgeon: Algernon Huxley, MD;  Location: [facility];  Service: Cardiovascular;  Laterality: Bilateral;   PUBOVAGINAL SLING     THROAT SURGERY     THYROID SURGERY     tumor around vocal cords    TOOTH EXTRACTION     winter 2018    TOTAL THYROIDECTOMY  1976   Family History  Problem Relation Age of Onset   Heart disease Mother    Diabetes Father    Cancer Daughter        breast ca x 2 s/p mastectomy    Social History   Socioeconomic History   Marital status: Widowed    Spouse name: Not on file   Number of children: 5   Years of education: Not on file   Highest education level: Not on file  Occupational History   Occupation: retired  Tobacco Use   Smoking status: Former    Packs/day: 0.50    Years: 20.00    Pack years: 10.00    Types: Cigarettes    Quit date: 09/20/1995    Years since quitting: 26.0   Smokeless tobacco: Never   Tobacco comments:    quit 1996 smoked 20 years max 8 cig qd   Vaping Use   Vaping Use: Never used  Substance and Sexual  Activity   Alcohol use: No   Drug use: No   Sexual activity: Not on file  Other Topics Concern   Not on file  Social History Narrative   Lives at home with caretaker currently [name]    Has children    Likes to ride stationary bike and sew             Social Determinants of Health   Financial Resource Strain: Low Risk    Difficulty of Paying Living Expenses: Not hard at all  Food Insecurity: No Food Insecurity   Worried About Charity fundraiser in the Last Year: Never true   Arboriculturist in the Last Year: Never true  Transportation Needs: No Transportation Needs   Lack of Transportation (Medical): No   Lack of Transportation (Non-Medical): No  Physical Activity: Insufficiently Active   Days of Exercise per Week: 7 days   Minutes of Exercise per Session: 20 min  Stress: No Stress Concern Present   Feeling of Stress : Only a little  Social Connections: Unknown   Frequency of Communication with Friends and Family: More than three times a week   Frequency of Social Gatherings with Friends and Family: More than three times a week   Attends Religious Services: Not on Electrical engineer or Organizations: Not on file   Attends Archivist Meetings: Not on file   Marital Status: Not on file   Clinical Intake:  Pre-visit preparation completed: Yes  Diabetic? Yes  Interpreter Needed?: No (Son  and caregiver assist as needed.)  Activities of Daily Living In your present state of health, do you have any difficulty performing the following activities: 09/30/2021 09/30/2021  Hearing? Y Y  Comment Deafness Totally deaf  Vision? N N  Difficulty concentrating or making decisions? N N  Walking or climbing stairs? Tempie Donning  Comment Uses wheelchair wheelchair/walker  Dressing or bathing? Y Y  Comment - -  Doing errands, shopping? Tempie Donning  Preparing Food and eating ? Y Y  Comment - Meals provided. Self feeds.  Using the Toilet? N N  In the past six months, have you  accidently leaked urine? Y Y  Comment - Wears depends  Do you have problems with loss of bowel control? N N  Managing your Medications? N N  Managing your Finances? Tempie Donning  Comment - Son Writer or managing your Housekeeping? Y Y  Comment - Son and caregiver  Some recent data might be hidden    Patient Care Team: McLean-Scocuzza, Nino Glow, MD as PCP - General (Internal Medicine)  Indicate any recent Medical Services you may have received from other than [facility] providers in the past year (date may be approximate).     Assessment:   This is a routine wellness examination for [place].   Virtual Visit via Telephone Note  I connected with  Dierdre Forth on 09/30/21 at  2:00 PM EDT by telephone and verified that I am speaking with the correct person using two identifiers.  Location: Patient: Home Provider: Office Persons participating in the virtual visit: patient/Son/Daughter/Nurse Health Advisor   I discussed the limitations, risks, security and privacy concerns of performing an evaluation and management service by telephone and the availability of in person appointments. The patient expressed understanding and agreed to proceed.  Interactive audio and video telecommunications were attempted between this nurse and patient, however failed, due to patient having technical difficulties OR patient did not have access to video capability.  We continued and completed visit with audio only.  Some vital signs may be absent or patient reported.   Criselda Peaches, LPN   Hearing/Vision screen Hearing Screening - Comments:: Deafness Vision Screening - Comments:: Followed by [facility] Wears corrective lenses  Cataract extraction, bilateral   Dietary issues and exercise activities discussed: Current Exercise Habits: Home exercise routine, Intensity: Mild  Diabetic   Goals Addressed               This Visit's Progress     Walk again (pt-stated)        PT/OT  exercise.       Depression Screen PHQ 2/9 Scores 09/30/2021 06/28/2021 03/29/2021 11/11/2020 09/29/2020 07/06/2020 04/06/2020  PHQ - 2 Score 0 0 0 0 0 5 0  PHQ- 9 Score - 0 0 0 - 10 -    Fall Risk Fall Risk  09/30/2021 09/29/2021 06/28/2021 05/03/2021 03/29/2021  Falls in the past year? 0 0 0 0 0  Number falls in past yr: 0 0 0 0 0  Injury with Fall? 0 0 0 0 0  Risk for fall due to : No Fall Risks;Impaired balance/gait Impaired balance/gait;Impaired mobility Impaired balance/gait;Impaired mobility Impaired balance/gait -  Follow up Falls evaluation completed Falls evaluation completed Falls evaluation completed Falls evaluation completed Falls evaluation completed   ASSISTIVE DEVICES UTILIZED TO PREVENT FALLS:  Use of a cane, walker or w/c? Yes  Grab bars in the bathroom? Yes  Shower chair or bench in shower? Yes  Elevated  toilet seat or a handicapped toilet? Yes   TIMED UP AND GO:  Was the test performed? No . Virtual visit.   Cognitive Function: Patient is alert and oriented x3.     6CIT Screen 09/29/2020 09/19/2018  What Year? 0 points 0 points  What month? 0 points 0 points  What time? 0 points 0 points  Count back from 20 - 0 points  Months in reverse - (No Data)    Immunizations Immunization History  Administered Date(s) Administered   Fluad Quad(high Dose 65+) 09/30/2019, 08/12/2020, 09/29/2021   Influenza, High Dose Seasonal PF 09/19/2018   Influenza-Unspecified 08/05/2015, 09/02/2017   PFIZER Comirnaty(Gray Top)Covid-19 Tri-Sucrose Vaccine 08/05/2021   PFIZER(Purple Top)SARS-COV-2 Vaccination 12/15/2019, 01/05/2020, 09/13/2020   Pfizer Covid-19 Vaccine Bivalent Booster 10yr & up 09/09/2021   Pneumococcal Polysaccharide-23 08/29/2018    Screening Tests Health Maintenance  Topic Date Due   URINE MICROALBUMIN  09/22/2021   OPHTHALMOLOGY EXAM  09/30/2021 (Originally 12/30/2020)   Zoster Vaccines- Shingrix (1 of 2) 12/31/2021 (Originally 04/23/1945)   Pneumonia  Vaccine 85 Years old (2 - PCV) 09/30/2022 (Originally 08/30/2019)   DEXA SCAN  09/30/2022 (Originally 04/22/1993)   COVID-19 Vaccine (5 - Booster for [place]) 11/04/2021   FOOT EXAM  01/27/2022   HEMOGLOBIN A1C  01/31/2022   TETANUS/TDAP  08/04/2026   INFLUENZA VACCINE  Completed   HPV VACCINES  Aged Out    Health Maintenance  Health Maintenance Due  Topic Date Due   URINE MICROALBUMIN  09/22/2021   Additional Screening:  Vision Screening: Recommended annual ophthalmology exams for early detection of glaucoma and other disorders of the eye. Is the patient up to date with their annual eye exam?  Yes  Who is the provider or what is the name of the office in which the patient attends annual eye exams? [facility]  Dental Screening: Recommended annual dental exams for proper oral hygiene.    Plan:    I have personally reviewed and noted the following in the patient's chart:   Medical and social history Use of alcohol, tobacco or illicit drugs. Not currently taking opioids. Current medications and supplements including opioid prescriptions.  Functional ability and status Nutritional status Physical activity Advanced directives List of other physicians Hospitalizations, surgeries, and ER visits in previous 12 months Vitals Screenings to include cognitive, depression, and falls Referrals and appointments  In addition, I have reviewed and discussed with patient certain preventive protocols, quality metrics, and best practice recommendations. A written personalized care plan for preventive services as well as general preventive health recommendations were provided to patient.    BCriselda Peaches LPN   [id number]

## 2021-09-30 NOTE — Telephone Encounter (Signed)
This been handled via previous encounter

## 2021-10-03 ENCOUNTER — Telehealth: Payer: Self-pay | Admitting: Internal Medicine

## 2021-10-03 DIAGNOSIS — J9611 Chronic respiratory failure with hypoxia: Secondary | ICD-10-CM | POA: Diagnosis not present

## 2021-10-03 DIAGNOSIS — E1140 Type 2 diabetes mellitus with diabetic neuropathy, unspecified: Secondary | ICD-10-CM | POA: Diagnosis not present

## 2021-10-03 DIAGNOSIS — I5032 Chronic diastolic (congestive) heart failure: Secondary | ICD-10-CM | POA: Diagnosis not present

## 2021-10-03 DIAGNOSIS — J479 Bronchiectasis, uncomplicated: Secondary | ICD-10-CM | POA: Diagnosis not present

## 2021-10-03 DIAGNOSIS — N1832 Chronic kidney disease, stage 3b: Secondary | ICD-10-CM | POA: Diagnosis not present

## 2021-10-03 DIAGNOSIS — J45909 Unspecified asthma, uncomplicated: Secondary | ICD-10-CM | POA: Diagnosis not present

## 2021-10-03 DIAGNOSIS — I130 Hypertensive heart and chronic kidney disease with heart failure and stage 1 through stage 4 chronic kidney disease, or unspecified chronic kidney disease: Secondary | ICD-10-CM | POA: Diagnosis not present

## 2021-10-03 DIAGNOSIS — I2510 Atherosclerotic heart disease of native coronary artery without angina pectoris: Secondary | ICD-10-CM | POA: Diagnosis not present

## 2021-10-03 DIAGNOSIS — E1122 Type 2 diabetes mellitus with diabetic chronic kidney disease: Secondary | ICD-10-CM | POA: Diagnosis not present

## 2021-10-03 NOTE — Telephone Encounter (Signed)
For home health care referral wanted PT/OT and 3x per week home visits  If its [facility] or whatever company can you call and insure of this please ?  Thank you

## 2021-10-05 DIAGNOSIS — N1832 Chronic kidney disease, stage 3b: Secondary | ICD-10-CM | POA: Diagnosis not present

## 2021-10-05 DIAGNOSIS — I130 Hypertensive heart and chronic kidney disease with heart failure and stage 1 through stage 4 chronic kidney disease, or unspecified chronic kidney disease: Secondary | ICD-10-CM | POA: Diagnosis not present

## 2021-10-05 DIAGNOSIS — J479 Bronchiectasis, uncomplicated: Secondary | ICD-10-CM | POA: Diagnosis not present

## 2021-10-05 DIAGNOSIS — I5032 Chronic diastolic (congestive) heart failure: Secondary | ICD-10-CM | POA: Diagnosis not present

## 2021-10-05 DIAGNOSIS — E1140 Type 2 diabetes mellitus with diabetic neuropathy, unspecified: Secondary | ICD-10-CM | POA: Diagnosis not present

## 2021-10-05 DIAGNOSIS — J45909 Unspecified asthma, uncomplicated: Secondary | ICD-10-CM | POA: Diagnosis not present

## 2021-10-05 DIAGNOSIS — J9611 Chronic respiratory failure with hypoxia: Secondary | ICD-10-CM | POA: Diagnosis not present

## 2021-10-05 DIAGNOSIS — E1122 Type 2 diabetes mellitus with diabetic chronic kidney disease: Secondary | ICD-10-CM | POA: Diagnosis not present

## 2021-10-05 DIAGNOSIS — I2510 Atherosclerotic heart disease of native coronary artery without angina pectoris: Secondary | ICD-10-CM | POA: Diagnosis not present

## 2021-10-06 ENCOUNTER — Encounter: Payer: Self-pay | Admitting: Internal Medicine

## 2021-10-06 DIAGNOSIS — J449 Chronic obstructive pulmonary disease, unspecified: Secondary | ICD-10-CM | POA: Diagnosis not present

## 2021-10-07 NOTE — Telephone Encounter (Signed)
Please advise if MRI is necessary for the Patient

## 2021-10-11 DIAGNOSIS — I5032 Chronic diastolic (congestive) heart failure: Secondary | ICD-10-CM | POA: Diagnosis not present

## 2021-10-11 DIAGNOSIS — J45909 Unspecified asthma, uncomplicated: Secondary | ICD-10-CM | POA: Diagnosis not present

## 2021-10-11 DIAGNOSIS — I2510 Atherosclerotic heart disease of native coronary artery without angina pectoris: Secondary | ICD-10-CM | POA: Diagnosis not present

## 2021-10-11 DIAGNOSIS — I130 Hypertensive heart and chronic kidney disease with heart failure and stage 1 through stage 4 chronic kidney disease, or unspecified chronic kidney disease: Secondary | ICD-10-CM | POA: Diagnosis not present

## 2021-10-11 DIAGNOSIS — E1122 Type 2 diabetes mellitus with diabetic chronic kidney disease: Secondary | ICD-10-CM | POA: Diagnosis not present

## 2021-10-11 DIAGNOSIS — J479 Bronchiectasis, uncomplicated: Secondary | ICD-10-CM | POA: Diagnosis not present

## 2021-10-11 DIAGNOSIS — E1140 Type 2 diabetes mellitus with diabetic neuropathy, unspecified: Secondary | ICD-10-CM | POA: Diagnosis not present

## 2021-10-11 DIAGNOSIS — J9611 Chronic respiratory failure with hypoxia: Secondary | ICD-10-CM | POA: Diagnosis not present

## 2021-10-11 DIAGNOSIS — N1832 Chronic kidney disease, stage 3b: Secondary | ICD-10-CM | POA: Diagnosis not present

## 2021-10-14 ENCOUNTER — Encounter: Payer: Self-pay | Admitting: Internal Medicine

## 2021-10-14 DIAGNOSIS — Z9181 History of falling: Secondary | ICD-10-CM | POA: Insufficient documentation

## 2021-10-14 NOTE — Telephone Encounter (Signed)
Order ready to be faxed

## 2021-10-14 NOTE — Telephone Encounter (Signed)
Patient daughter calling back in to check on this message

## 2021-10-17 DIAGNOSIS — E1140 Type 2 diabetes mellitus with diabetic neuropathy, unspecified: Secondary | ICD-10-CM | POA: Diagnosis not present

## 2021-10-17 DIAGNOSIS — J45909 Unspecified asthma, uncomplicated: Secondary | ICD-10-CM | POA: Diagnosis not present

## 2021-10-17 DIAGNOSIS — I5032 Chronic diastolic (congestive) heart failure: Secondary | ICD-10-CM | POA: Diagnosis not present

## 2021-10-17 DIAGNOSIS — N1832 Chronic kidney disease, stage 3b: Secondary | ICD-10-CM | POA: Diagnosis not present

## 2021-10-17 DIAGNOSIS — I130 Hypertensive heart and chronic kidney disease with heart failure and stage 1 through stage 4 chronic kidney disease, or unspecified chronic kidney disease: Secondary | ICD-10-CM | POA: Diagnosis not present

## 2021-10-17 DIAGNOSIS — J9611 Chronic respiratory failure with hypoxia: Secondary | ICD-10-CM | POA: Diagnosis not present

## 2021-10-17 DIAGNOSIS — E1122 Type 2 diabetes mellitus with diabetic chronic kidney disease: Secondary | ICD-10-CM | POA: Diagnosis not present

## 2021-10-17 DIAGNOSIS — J479 Bronchiectasis, uncomplicated: Secondary | ICD-10-CM | POA: Diagnosis not present

## 2021-10-17 DIAGNOSIS — I2510 Atherosclerotic heart disease of native coronary artery without angina pectoris: Secondary | ICD-10-CM | POA: Diagnosis not present

## 2021-10-18 DIAGNOSIS — I2510 Atherosclerotic heart disease of native coronary artery without angina pectoris: Secondary | ICD-10-CM | POA: Diagnosis not present

## 2021-10-18 DIAGNOSIS — I130 Hypertensive heart and chronic kidney disease with heart failure and stage 1 through stage 4 chronic kidney disease, or unspecified chronic kidney disease: Secondary | ICD-10-CM | POA: Diagnosis not present

## 2021-10-18 DIAGNOSIS — N1832 Chronic kidney disease, stage 3b: Secondary | ICD-10-CM | POA: Diagnosis not present

## 2021-10-18 DIAGNOSIS — J45909 Unspecified asthma, uncomplicated: Secondary | ICD-10-CM | POA: Diagnosis not present

## 2021-10-18 DIAGNOSIS — I5032 Chronic diastolic (congestive) heart failure: Secondary | ICD-10-CM | POA: Diagnosis not present

## 2021-10-18 DIAGNOSIS — E1140 Type 2 diabetes mellitus with diabetic neuropathy, unspecified: Secondary | ICD-10-CM | POA: Diagnosis not present

## 2021-10-18 DIAGNOSIS — J479 Bronchiectasis, uncomplicated: Secondary | ICD-10-CM | POA: Diagnosis not present

## 2021-10-18 DIAGNOSIS — E1122 Type 2 diabetes mellitus with diabetic chronic kidney disease: Secondary | ICD-10-CM | POA: Diagnosis not present

## 2021-10-18 DIAGNOSIS — J9611 Chronic respiratory failure with hypoxia: Secondary | ICD-10-CM | POA: Diagnosis not present

## 2021-10-19 ENCOUNTER — Encounter: Payer: Self-pay | Admitting: Internal Medicine

## 2021-10-19 DIAGNOSIS — R269 Unspecified abnormalities of gait and mobility: Secondary | ICD-10-CM | POA: Insufficient documentation

## 2021-10-20 DIAGNOSIS — I2510 Atherosclerotic heart disease of native coronary artery without angina pectoris: Secondary | ICD-10-CM | POA: Diagnosis not present

## 2021-10-20 DIAGNOSIS — N1832 Chronic kidney disease, stage 3b: Secondary | ICD-10-CM | POA: Diagnosis not present

## 2021-10-20 DIAGNOSIS — J45909 Unspecified asthma, uncomplicated: Secondary | ICD-10-CM | POA: Diagnosis not present

## 2021-10-20 DIAGNOSIS — E1122 Type 2 diabetes mellitus with diabetic chronic kidney disease: Secondary | ICD-10-CM | POA: Diagnosis not present

## 2021-10-20 DIAGNOSIS — J9611 Chronic respiratory failure with hypoxia: Secondary | ICD-10-CM | POA: Diagnosis not present

## 2021-10-20 DIAGNOSIS — I130 Hypertensive heart and chronic kidney disease with heart failure and stage 1 through stage 4 chronic kidney disease, or unspecified chronic kidney disease: Secondary | ICD-10-CM | POA: Diagnosis not present

## 2021-10-20 DIAGNOSIS — E1140 Type 2 diabetes mellitus with diabetic neuropathy, unspecified: Secondary | ICD-10-CM | POA: Diagnosis not present

## 2021-10-20 DIAGNOSIS — J479 Bronchiectasis, uncomplicated: Secondary | ICD-10-CM | POA: Diagnosis not present

## 2021-10-20 DIAGNOSIS — I5032 Chronic diastolic (congestive) heart failure: Secondary | ICD-10-CM | POA: Diagnosis not present

## 2021-10-21 DIAGNOSIS — J9611 Chronic respiratory failure with hypoxia: Secondary | ICD-10-CM | POA: Diagnosis not present

## 2021-10-21 DIAGNOSIS — E1122 Type 2 diabetes mellitus with diabetic chronic kidney disease: Secondary | ICD-10-CM | POA: Diagnosis not present

## 2021-10-21 DIAGNOSIS — I130 Hypertensive heart and chronic kidney disease with heart failure and stage 1 through stage 4 chronic kidney disease, or unspecified chronic kidney disease: Secondary | ICD-10-CM | POA: Diagnosis not present

## 2021-10-21 DIAGNOSIS — J479 Bronchiectasis, uncomplicated: Secondary | ICD-10-CM | POA: Diagnosis not present

## 2021-10-21 DIAGNOSIS — N1832 Chronic kidney disease, stage 3b: Secondary | ICD-10-CM | POA: Diagnosis not present

## 2021-10-21 DIAGNOSIS — I5032 Chronic diastolic (congestive) heart failure: Secondary | ICD-10-CM | POA: Diagnosis not present

## 2021-10-21 DIAGNOSIS — I2510 Atherosclerotic heart disease of native coronary artery without angina pectoris: Secondary | ICD-10-CM | POA: Diagnosis not present

## 2021-10-21 DIAGNOSIS — E1140 Type 2 diabetes mellitus with diabetic neuropathy, unspecified: Secondary | ICD-10-CM | POA: Diagnosis not present

## 2021-10-21 DIAGNOSIS — J45909 Unspecified asthma, uncomplicated: Secondary | ICD-10-CM | POA: Diagnosis not present

## 2021-10-24 DIAGNOSIS — E1122 Type 2 diabetes mellitus with diabetic chronic kidney disease: Secondary | ICD-10-CM | POA: Diagnosis not present

## 2021-10-24 DIAGNOSIS — J45909 Unspecified asthma, uncomplicated: Secondary | ICD-10-CM | POA: Diagnosis not present

## 2021-10-24 DIAGNOSIS — I5032 Chronic diastolic (congestive) heart failure: Secondary | ICD-10-CM | POA: Diagnosis not present

## 2021-10-24 DIAGNOSIS — N1832 Chronic kidney disease, stage 3b: Secondary | ICD-10-CM | POA: Diagnosis not present

## 2021-10-24 DIAGNOSIS — J479 Bronchiectasis, uncomplicated: Secondary | ICD-10-CM | POA: Diagnosis not present

## 2021-10-24 DIAGNOSIS — I2510 Atherosclerotic heart disease of native coronary artery without angina pectoris: Secondary | ICD-10-CM | POA: Diagnosis not present

## 2021-10-24 DIAGNOSIS — E1140 Type 2 diabetes mellitus with diabetic neuropathy, unspecified: Secondary | ICD-10-CM | POA: Diagnosis not present

## 2021-10-24 DIAGNOSIS — I130 Hypertensive heart and chronic kidney disease with heart failure and stage 1 through stage 4 chronic kidney disease, or unspecified chronic kidney disease: Secondary | ICD-10-CM | POA: Diagnosis not present

## 2021-10-24 DIAGNOSIS — J9611 Chronic respiratory failure with hypoxia: Secondary | ICD-10-CM | POA: Diagnosis not present

## 2021-10-25 DIAGNOSIS — I5032 Chronic diastolic (congestive) heart failure: Secondary | ICD-10-CM | POA: Diagnosis not present

## 2021-10-25 DIAGNOSIS — I130 Hypertensive heart and chronic kidney disease with heart failure and stage 1 through stage 4 chronic kidney disease, or unspecified chronic kidney disease: Secondary | ICD-10-CM | POA: Diagnosis not present

## 2021-10-25 DIAGNOSIS — E1122 Type 2 diabetes mellitus with diabetic chronic kidney disease: Secondary | ICD-10-CM | POA: Diagnosis not present

## 2021-10-25 DIAGNOSIS — J9611 Chronic respiratory failure with hypoxia: Secondary | ICD-10-CM | POA: Diagnosis not present

## 2021-10-25 DIAGNOSIS — J45909 Unspecified asthma, uncomplicated: Secondary | ICD-10-CM | POA: Diagnosis not present

## 2021-10-25 DIAGNOSIS — N1832 Chronic kidney disease, stage 3b: Secondary | ICD-10-CM | POA: Diagnosis not present

## 2021-10-25 DIAGNOSIS — I2510 Atherosclerotic heart disease of native coronary artery without angina pectoris: Secondary | ICD-10-CM | POA: Diagnosis not present

## 2021-10-25 DIAGNOSIS — J479 Bronchiectasis, uncomplicated: Secondary | ICD-10-CM | POA: Diagnosis not present

## 2021-10-25 DIAGNOSIS — E1140 Type 2 diabetes mellitus with diabetic neuropathy, unspecified: Secondary | ICD-10-CM | POA: Diagnosis not present

## 2021-10-26 DIAGNOSIS — J45909 Unspecified asthma, uncomplicated: Secondary | ICD-10-CM | POA: Diagnosis not present

## 2021-10-26 DIAGNOSIS — E1140 Type 2 diabetes mellitus with diabetic neuropathy, unspecified: Secondary | ICD-10-CM | POA: Diagnosis not present

## 2021-10-26 DIAGNOSIS — I5032 Chronic diastolic (congestive) heart failure: Secondary | ICD-10-CM | POA: Diagnosis not present

## 2021-10-26 DIAGNOSIS — I2510 Atherosclerotic heart disease of native coronary artery without angina pectoris: Secondary | ICD-10-CM | POA: Diagnosis not present

## 2021-10-26 DIAGNOSIS — N1832 Chronic kidney disease, stage 3b: Secondary | ICD-10-CM | POA: Diagnosis not present

## 2021-10-26 DIAGNOSIS — J479 Bronchiectasis, uncomplicated: Secondary | ICD-10-CM | POA: Diagnosis not present

## 2021-10-26 DIAGNOSIS — E1122 Type 2 diabetes mellitus with diabetic chronic kidney disease: Secondary | ICD-10-CM | POA: Diagnosis not present

## 2021-10-26 DIAGNOSIS — J9611 Chronic respiratory failure with hypoxia: Secondary | ICD-10-CM | POA: Diagnosis not present

## 2021-10-26 DIAGNOSIS — I130 Hypertensive heart and chronic kidney disease with heart failure and stage 1 through stage 4 chronic kidney disease, or unspecified chronic kidney disease: Secondary | ICD-10-CM | POA: Diagnosis not present

## 2021-10-31 DIAGNOSIS — J9611 Chronic respiratory failure with hypoxia: Secondary | ICD-10-CM | POA: Diagnosis not present

## 2021-10-31 DIAGNOSIS — I5032 Chronic diastolic (congestive) heart failure: Secondary | ICD-10-CM | POA: Diagnosis not present

## 2021-10-31 DIAGNOSIS — E1140 Type 2 diabetes mellitus with diabetic neuropathy, unspecified: Secondary | ICD-10-CM | POA: Diagnosis not present

## 2021-10-31 DIAGNOSIS — J479 Bronchiectasis, uncomplicated: Secondary | ICD-10-CM | POA: Diagnosis not present

## 2021-10-31 DIAGNOSIS — I2510 Atherosclerotic heart disease of native coronary artery without angina pectoris: Secondary | ICD-10-CM | POA: Diagnosis not present

## 2021-10-31 DIAGNOSIS — J45909 Unspecified asthma, uncomplicated: Secondary | ICD-10-CM | POA: Diagnosis not present

## 2021-10-31 DIAGNOSIS — I130 Hypertensive heart and chronic kidney disease with heart failure and stage 1 through stage 4 chronic kidney disease, or unspecified chronic kidney disease: Secondary | ICD-10-CM | POA: Diagnosis not present

## 2021-10-31 DIAGNOSIS — N1832 Chronic kidney disease, stage 3b: Secondary | ICD-10-CM | POA: Diagnosis not present

## 2021-10-31 DIAGNOSIS — E1122 Type 2 diabetes mellitus with diabetic chronic kidney disease: Secondary | ICD-10-CM | POA: Diagnosis not present

## 2021-11-02 DIAGNOSIS — J9611 Chronic respiratory failure with hypoxia: Secondary | ICD-10-CM | POA: Diagnosis not present

## 2021-11-02 DIAGNOSIS — N1832 Chronic kidney disease, stage 3b: Secondary | ICD-10-CM | POA: Diagnosis not present

## 2021-11-02 DIAGNOSIS — I130 Hypertensive heart and chronic kidney disease with heart failure and stage 1 through stage 4 chronic kidney disease, or unspecified chronic kidney disease: Secondary | ICD-10-CM | POA: Diagnosis not present

## 2021-11-02 DIAGNOSIS — J479 Bronchiectasis, uncomplicated: Secondary | ICD-10-CM | POA: Diagnosis not present

## 2021-11-02 DIAGNOSIS — E1140 Type 2 diabetes mellitus with diabetic neuropathy, unspecified: Secondary | ICD-10-CM | POA: Diagnosis not present

## 2021-11-02 DIAGNOSIS — E1122 Type 2 diabetes mellitus with diabetic chronic kidney disease: Secondary | ICD-10-CM | POA: Diagnosis not present

## 2021-11-02 DIAGNOSIS — I5032 Chronic diastolic (congestive) heart failure: Secondary | ICD-10-CM | POA: Diagnosis not present

## 2021-11-02 DIAGNOSIS — J45909 Unspecified asthma, uncomplicated: Secondary | ICD-10-CM | POA: Diagnosis not present

## 2021-11-02 DIAGNOSIS — I2510 Atherosclerotic heart disease of native coronary artery without angina pectoris: Secondary | ICD-10-CM | POA: Diagnosis not present

## 2021-11-03 ENCOUNTER — Other Ambulatory Visit: Payer: Self-pay | Admitting: Internal Medicine

## 2021-11-03 DIAGNOSIS — G500 Trigeminal neuralgia: Secondary | ICD-10-CM

## 2021-11-04 ENCOUNTER — Other Ambulatory Visit: Payer: Self-pay

## 2021-11-04 DIAGNOSIS — N1832 Chronic kidney disease, stage 3b: Secondary | ICD-10-CM | POA: Diagnosis not present

## 2021-11-04 DIAGNOSIS — E1140 Type 2 diabetes mellitus with diabetic neuropathy, unspecified: Secondary | ICD-10-CM | POA: Diagnosis not present

## 2021-11-04 DIAGNOSIS — J9611 Chronic respiratory failure with hypoxia: Secondary | ICD-10-CM | POA: Diagnosis not present

## 2021-11-04 DIAGNOSIS — J479 Bronchiectasis, uncomplicated: Secondary | ICD-10-CM | POA: Diagnosis not present

## 2021-11-04 DIAGNOSIS — I5032 Chronic diastolic (congestive) heart failure: Secondary | ICD-10-CM | POA: Diagnosis not present

## 2021-11-04 DIAGNOSIS — I2510 Atherosclerotic heart disease of native coronary artery without angina pectoris: Secondary | ICD-10-CM | POA: Diagnosis not present

## 2021-11-04 DIAGNOSIS — I130 Hypertensive heart and chronic kidney disease with heart failure and stage 1 through stage 4 chronic kidney disease, or unspecified chronic kidney disease: Secondary | ICD-10-CM | POA: Diagnosis not present

## 2021-11-04 DIAGNOSIS — E1122 Type 2 diabetes mellitus with diabetic chronic kidney disease: Secondary | ICD-10-CM | POA: Diagnosis not present

## 2021-11-04 DIAGNOSIS — J45909 Unspecified asthma, uncomplicated: Secondary | ICD-10-CM | POA: Diagnosis not present

## 2021-11-04 MED ORDER — MELATONIN 10 MG PO TABS: 10.0000 mg | ORAL_TABLET | Freq: Every evening | ORAL | 1 refills | Status: DC

## 2021-11-07 ENCOUNTER — Other Ambulatory Visit: Payer: Self-pay | Admitting: Internal Medicine

## 2021-11-07 MED ORDER — MELATONIN 10 MG PO TABS: 10.0000 mg | ORAL_TABLET | Freq: Every evening | ORAL | 3 refills | Status: AC

## 2021-11-10 ENCOUNTER — Ambulatory Visit (INDEPENDENT_AMBULATORY_CARE_PROVIDER_SITE_OTHER): Payer: Medicare PPO | Admitting: Vascular Surgery

## 2021-11-10 DIAGNOSIS — I130 Hypertensive heart and chronic kidney disease with heart failure and stage 1 through stage 4 chronic kidney disease, or unspecified chronic kidney disease: Secondary | ICD-10-CM | POA: Diagnosis not present

## 2021-11-10 DIAGNOSIS — E1122 Type 2 diabetes mellitus with diabetic chronic kidney disease: Secondary | ICD-10-CM | POA: Diagnosis not present

## 2021-11-10 DIAGNOSIS — J45909 Unspecified asthma, uncomplicated: Secondary | ICD-10-CM | POA: Diagnosis not present

## 2021-11-10 DIAGNOSIS — J479 Bronchiectasis, uncomplicated: Secondary | ICD-10-CM | POA: Diagnosis not present

## 2021-11-10 DIAGNOSIS — J9611 Chronic respiratory failure with hypoxia: Secondary | ICD-10-CM | POA: Diagnosis not present

## 2021-11-10 DIAGNOSIS — N1832 Chronic kidney disease, stage 3b: Secondary | ICD-10-CM | POA: Diagnosis not present

## 2021-11-10 DIAGNOSIS — I5032 Chronic diastolic (congestive) heart failure: Secondary | ICD-10-CM | POA: Diagnosis not present

## 2021-11-14 NOTE — Telephone Encounter (Signed)
Orders were faxed and confirmation received

## 2021-11-21 ENCOUNTER — Telehealth: Payer: Self-pay | Admitting: Internal Medicine

## 2021-11-21 NOTE — Telephone Encounter (Signed)
Written prescription for Wheelchair, toilet seat, and hospital bed has been signed by Dr Olivia Mackie McLean-Scocuzza and attached to relevant office note.   Called to inform Patient son that this has been completed. Asked if they would like to pick this up or have it faxed somewhere. Son states he will come to pick this up. Placed upfront

## 2021-11-22 ENCOUNTER — Other Ambulatory Visit: Payer: Self-pay | Admitting: Internal Medicine

## 2021-11-22 DIAGNOSIS — R600 Localized edema: Secondary | ICD-10-CM

## 2021-11-23 ENCOUNTER — Telehealth: Payer: Self-pay | Admitting: Internal Medicine

## 2021-11-23 ENCOUNTER — Other Ambulatory Visit: Payer: Self-pay | Admitting: Internal Medicine

## 2021-11-23 DIAGNOSIS — R600 Localized edema: Secondary | ICD-10-CM

## 2021-11-23 NOTE — Telephone Encounter (Signed)
Called and spoke with Patient's son to get name of the company he wants this faxed to. He states that [facility] informed him that out office should know how to fax this to them and start the authorization.   Informed him that our office does not directly handle authorizations for medical supplies. That we can fax all information to the supply store of his choice and the supply store will file insurance and initiate the authorization process. Informed the Patient of the name of [facility] and [facility].   Patient son states to leave the information at the front desk and he will come to pick this up.

## 2021-11-23 NOTE — Telephone Encounter (Signed)
Patient needs a refill on her Furosimide 40mg  tablet.

## 2021-11-23 NOTE — Telephone Encounter (Signed)
Medication sent in to preferred pharmacy per protocol.   

## 2021-11-23 NOTE — Telephone Encounter (Signed)
Pt son called in regards to orders for wheelchair/toilet seat/hosp bed Pt states it needs to be faxed to the insurance company. Pts son would like it to be faxed and also have a copy put in the front office for pick up. Pts son would like a copy for his records.

## 2021-11-28 ENCOUNTER — Other Ambulatory Visit: Payer: Self-pay | Admitting: Internal Medicine

## 2021-11-28 DIAGNOSIS — G500 Trigeminal neuralgia: Secondary | ICD-10-CM

## 2021-12-15 ENCOUNTER — Ambulatory Visit: Payer: Medicare PPO | Admitting: Podiatry

## 2021-12-30 ENCOUNTER — Telehealth: Payer: Self-pay | Admitting: Pulmonary Disease

## 2021-12-30 NOTE — Telephone Encounter (Signed)
Noted  

## 2022-01-04 ENCOUNTER — Ambulatory Visit: Payer: Medicare PPO | Admitting: Internal Medicine

## 2022-01-09 ENCOUNTER — Telehealth: Payer: Self-pay | Admitting: Internal Medicine

## 2022-01-09 DIAGNOSIS — J449 Chronic obstructive pulmonary disease, unspecified: Secondary | ICD-10-CM

## 2022-01-09 NOTE — Telephone Encounter (Signed)
Pt son called in stating that Pt nebulizer machine is not working. Pt son requesting a script for new nebulizer machine for Pt. Pt son stated that the facility the old nebulizer came from is [facility]. Pt son stated there fax number is [phone]. Pt son requesting callback with update

## 2022-01-10 MED ORDER — IPRATROPIUM-ALBUTEROL 0.5-2.5 (3) MG/3ML IN SOLN: 3.0000 mL | RESPIRATORY_TRACT | 11 refills | Status: DC | PRN

## 2022-01-10 NOTE — Telephone Encounter (Signed)
Please fax order to [facility] and call son thank you

## 2022-01-10 NOTE — Telephone Encounter (Signed)
Okay for new nebulizer order?

## 2022-01-10 NOTE — Addendum Note (Signed)
Addended by: Orland Mustard on: 01/10/2022 01:51 PM   Modules accepted: Orders

## 2022-01-11 NOTE — Telephone Encounter (Signed)
Called patient's son to reschedule appointment and also read note below him. He said thank you.

## 2022-01-12 ENCOUNTER — Ambulatory Visit: Payer: Medicare PPO | Admitting: Pulmonary Disease

## 2022-01-13 NOTE — Telephone Encounter (Signed)
Noted  

## 2022-01-17 ENCOUNTER — Encounter: Payer: Self-pay | Admitting: Internal Medicine

## 2022-01-17 NOTE — Telephone Encounter (Signed)
Curly Rim (caretaker) called for pt wanting an update on prescription for nebulizer Fax number [phone]

## 2022-01-18 NOTE — Telephone Encounter (Signed)
Order was signed and faxed a week ago.   New order being printed and signed by doc of the day as Dr Olivia Mackie McLean-Scocuzza is out of the office. Printing demographics and last note to fax along with order

## 2022-01-18 NOTE — Addendum Note (Signed)
Addended by: Thressa Sheller on: 01/18/2022 09:41 AM   Modules accepted: Orders

## 2022-01-18 NOTE — Telephone Encounter (Signed)
Caregiver called and stated they have not received orders. Please try this fax number, [phone]

## 2022-01-19 NOTE — Telephone Encounter (Signed)
I called [facility] they referred me to [facility] who now handles neb supplies. When I called [facility] they were able to see where nebulizer was delivered yesterday & caregiver signed for. I was able to call patient's son & patient doing fine. She received neb yesterday as [facility] had stated.

## 2022-01-20 ENCOUNTER — Ambulatory Visit: Payer: Medicare PPO | Admitting: Internal Medicine

## 2022-01-20 NOTE — Telephone Encounter (Signed)
Order was faxed and they called back in to have faxed to a new number. This has gone through and received confirmation to another CMA that the order was received.   Order has been sent to scan.

## 2022-01-24 ENCOUNTER — Other Ambulatory Visit: Payer: Self-pay

## 2022-01-24 ENCOUNTER — Ambulatory Visit: Payer: Medicare PPO | Admitting: Podiatry

## 2022-01-24 ENCOUNTER — Encounter: Payer: Self-pay | Admitting: Podiatry

## 2022-01-24 DIAGNOSIS — M79676 Pain in unspecified toe(s): Secondary | ICD-10-CM | POA: Diagnosis not present

## 2022-01-24 DIAGNOSIS — E1140 Type 2 diabetes mellitus with diabetic neuropathy, unspecified: Secondary | ICD-10-CM

## 2022-01-24 DIAGNOSIS — B351 Tinea unguium: Secondary | ICD-10-CM | POA: Diagnosis not present

## 2022-01-25 ENCOUNTER — Ambulatory Visit: Payer: Medicare PPO | Admitting: Internal Medicine

## 2022-01-25 ENCOUNTER — Encounter: Payer: Self-pay | Admitting: Internal Medicine

## 2022-01-25 ENCOUNTER — Other Ambulatory Visit: Payer: Self-pay

## 2022-01-25 VITALS — BP 110/80 | HR 66 | Temp 97.5°F | Ht 67.0 in | Wt 189.6 lb

## 2022-01-25 DIAGNOSIS — G4700 Insomnia, unspecified: Secondary | ICD-10-CM

## 2022-01-25 DIAGNOSIS — E119 Type 2 diabetes mellitus without complications: Secondary | ICD-10-CM

## 2022-01-25 DIAGNOSIS — R7303 Prediabetes: Secondary | ICD-10-CM

## 2022-01-25 DIAGNOSIS — N184 Chronic kidney disease, stage 4 (severe): Secondary | ICD-10-CM

## 2022-01-25 DIAGNOSIS — R8271 Bacteriuria: Secondary | ICD-10-CM

## 2022-01-25 DIAGNOSIS — E039 Hypothyroidism, unspecified: Secondary | ICD-10-CM

## 2022-01-25 DIAGNOSIS — G500 Trigeminal neuralgia: Secondary | ICD-10-CM

## 2022-01-25 DIAGNOSIS — K219 Gastro-esophageal reflux disease without esophagitis: Secondary | ICD-10-CM

## 2022-01-25 DIAGNOSIS — Z86718 Personal history of other venous thrombosis and embolism: Secondary | ICD-10-CM

## 2022-01-25 DIAGNOSIS — J309 Allergic rhinitis, unspecified: Secondary | ICD-10-CM | POA: Diagnosis not present

## 2022-01-25 DIAGNOSIS — R319 Hematuria, unspecified: Secondary | ICD-10-CM

## 2022-01-25 DIAGNOSIS — J439 Emphysema, unspecified: Secondary | ICD-10-CM

## 2022-01-25 DIAGNOSIS — F32A Depression, unspecified: Secondary | ICD-10-CM

## 2022-01-25 LAB — POCT GLYCOSYLATED HEMOGLOBIN (HGB A1C): Hemoglobin A1C: 5.7 % — AB (ref 4.0–5.6)

## 2022-01-25 MED ORDER — TRELEGY ELLIPTA 100-62.5-25 MCG/ACT IN AEPB: 1.0000 | INHALATION_SPRAY | Freq: Every day | RESPIRATORY_TRACT | 11 refills | Status: DC

## 2022-01-25 MED ORDER — LEVOTHYROXINE SODIUM 200 MCG PO TABS: 200.0000 ug | ORAL_TABLET | Freq: Every day | ORAL | 3 refills | Status: DC

## 2022-01-25 MED ORDER — CETIRIZINE HCL 10 MG PO TABS: 10.0000 mg | ORAL_TABLET | Freq: Every day | ORAL | 3 refills | Status: DC

## 2022-01-25 MED ORDER — ATORVASTATIN CALCIUM 40 MG PO TABS: 40.0000 mg | ORAL_TABLET | Freq: Every day | ORAL | 3 refills | Status: DC

## 2022-01-25 MED ORDER — GABAPENTIN 100 MG PO CAPS: ORAL_CAPSULE | ORAL | 3 refills | Status: DC

## 2022-01-25 MED ORDER — ALBUTEROL SULFATE (2.5 MG/3ML) 0.083% IN NEBU: 2.5000 mg | INHALATION_SOLUTION | Freq: Three times a day (TID) | RESPIRATORY_TRACT | 11 refills | Status: DC

## 2022-01-25 MED ORDER — PANTOPRAZOLE SODIUM 40 MG PO TBEC: 40.0000 mg | DELAYED_RELEASE_TABLET | Freq: Every day | ORAL | 3 refills | Status: DC

## 2022-01-25 MED ORDER — MIRTAZAPINE 15 MG PO TABS: 15.0000 mg | ORAL_TABLET | Freq: Every day | ORAL | 3 refills | Status: DC

## 2022-01-25 MED ORDER — APIXABAN 2.5 MG PO TABS: 2.5000 mg | ORAL_TABLET | Freq: Two times a day (BID) | ORAL | 3 refills | Status: DC

## 2022-01-25 NOTE — Patient Instructions (Addendum)
Aspercream with lidocaine Heat  Ice   Blood sugar goal in the am 90-140 and 2 hours after meals <180 blood sugar    Dermatology Dr. Youlanda Mighty dermatology

## 2022-01-25 NOTE — Progress Notes (Signed)
Chief Complaint  Patient presents with   Follow-up   F/u with son Jessica Erickson who is POA 1 copd stable doing well using flutter valve 6x per day and neb and trelegy 100-62.5-25 qd breathing improved with new neb machine  2. Hypothyrodism on levo 200 mcg  3. Prediabetes A1c improved today 5.7 she is eating less    Review of Systems  Constitutional:  Negative for weight loss.  HENT:  Negative for hearing loss.   Eyes:  Negative for blurred vision.  Respiratory:  Negative for shortness of breath.   Cardiovascular:  Negative for chest pain.  Gastrointestinal:  Negative for abdominal pain and blood in stool.  Genitourinary:  Negative for dysuria.  Musculoskeletal:  Negative for falls and joint pain.  Skin:  Negative for rash.  Neurological:  Negative for headaches.  Psychiatric/Behavioral:  Negative for depression.   Past Medical History:  Diagnosis Date   Acoustic neuroma (HCC)    Allergy    Asthma    Bilateral swelling of feet    and legs   Bladder infection    CAD (coronary artery disease)    Cataract    Change in voice    Compression fracture of body of thoracic vertebra (HCC)    T12 09/18/15 MRI s/p fall    Constipation    COPD (chronic obstructive pulmonary disease) (HCC)    previous CXR with chronic interstitial lung dz    CVA (cerebral vascular accident) ([place])    Depression    Diabetes ([place])    with neuropathy   Diabetes mellitus, type 2 ([place])    Diarrhea    Double vision    DVT (deep venous thrombosis) ([place])    right leg 10/2015 was on coumadin off as of 2017/2018 ; s/p IVC filter   Enuresis    Eye pain, right    Fall    Fatty liver    09/15/15 also mildly dilated pancreatitic duct rec MRCP small sub cm cyst hemangioma speeln mild right hydronephrorossi and prox. hydroureter, kidney stones, mild scarring kidneys   Female stress incontinence    Flank pain    GERD (gastroesophageal reflux disease)    with small hiatal hernia    Hard of hearing    Heart disease     History of kidney problems    Hyperlipidemia    mixed   Hypertension    Hypothyroidism, postsurgical    Impaired mobility and ADLs    uses rolling walker has caretaker 24/7 at home   Leg edema    Mixed incontinence urge and stress (female)(female)    Neuropathy    Osteoarthritis    DDD spine    Osteoporosis with fracture    T12 compression fracture   Photophobia    Pulmonary embolism ([place])    10/2015 off coumadin as of 04/2016   Pulmonary HTN (HCC)    mild pulm HTN, echo 10/09/15 EF 64-15%AXENM 1 dd, RV systolic pressure increased    Recurrent UTI    Sinus pressure    Skin cancer    BCC jawline and scalp    Thyroid disease    follows [facility]   TIA (transient ischemic attack)    MRI 2009/2010 neg stroke    Trigeminal neuralgia    Dr. Tomi Bamberger s/p gamma knife x 2, on Tegretol since 2011/2012 no increase in dose >200 mg bid rec per family per neurology    Urinary frequency    Urinary, incontinence, stress female    Dr  [facility] urology    Past Surgical History:  Procedure Laterality Date   APPENDECTOMY     as a child, open   BRAIN SURGERY     schwnnoma removal 1996    brain tumor surgery     BREAST SURGERY     breast bx   CATARACT EXTRACTION     CHOLECYSTECTOMY     EYE SURGERY     cataract   IVC FILTER PLACEMENT ([place] HX)     Dr. Lucky Cowboy 10/2015    LAPAROSCOPIC TUBAL LIGATION     MOHS SURGERY     scalp 04/2014    PERIPHERAL VASCULAR CATHETERIZATION N/A 10/11/2015   Procedure: IVC Filter Insertion;  Surgeon: Algernon Huxley, MD;  Location: [facility];  Service: Cardiovascular;  Laterality: N/A;   PERIPHERAL VASCULAR THROMBECTOMY Bilateral 03/29/2018   Procedure: PERIPHERAL VASCULAR THROMBECTOMY;  Surgeon: Algernon Huxley, MD;  Location: [facility];  Service: Cardiovascular;  Laterality: Bilateral;   PUBOVAGINAL SLING     THROAT SURGERY     THYROID SURGERY     tumor around vocal cords    TOOTH EXTRACTION     winter 2018    TOTAL THYROIDECTOMY  1976    Family History  Problem Relation Age of Onset   Heart disease Mother    Diabetes Father    Cancer Daughter        breast ca x 2 s/p mastectomy    Social History   Socioeconomic History   Marital status: Widowed    Spouse name: Not on file   Number of children: 5   Years of education: Not on file   Highest education level: Not on file  Occupational History   Occupation: retired  Tobacco Use   Smoking status: Former    Packs/day: 0.50    Years: 20.00    Pack years: 10.00    Types: Cigarettes    Quit date: 09/20/1995    Years since quitting: 26.3   Smokeless tobacco: Never   Tobacco comments:    quit 1996 smoked 20 years max 8 cig qd   Vaping Use   Vaping Use: Never used  Substance and Sexual Activity   Alcohol use: No   Drug use: No   Sexual activity: Not on file  Other Topics Concern   Not on file  Social History Narrative   Lives at home with Jessica Erickson son and caretakers come to house    Has children    Likes to ride stationary bike and sew    [name] lives with as of 2022 and he is POA youngest son            Social Determinants of Radio broadcast assistant Strain: Low Risk    Difficulty of Paying Living Expenses: Not hard at all  Food Insecurity: No Food Insecurity   Worried About Charity fundraiser in the Last Year: Never true   Arboriculturist in the Last Year: Never true  Transportation Needs: No Transportation Needs   Lack of Transportation (Medical): No   Lack of Transportation (Non-Medical): No  Physical Activity: Insufficiently Active   Days of Exercise per Week: 7 days   Minutes of Exercise per Session: 20 min  Stress: No Stress Concern Present   Feeling of Stress : Only a little  Social Connections: Unknown   Frequency of Communication with Friends and Family: More than three times a week   Frequency of Social Gatherings with  Friends and Family: More than three times a week   Attends Religious Services: Not on file   Active Member of Clubs or  Organizations: Not on file   Attends Archivist Meetings: Not on file   Marital Status: Not on file  Intimate Partner Violence: Not At Risk   Fear of Current or Ex-Partner: No   Emotionally Abused: No   Physically Abused: No   Sexually Abused: No   Current Meds  Medication Sig   Accu-Chek Softclix Lancets lancets Use as instructed   acetaminophen (TYLENOL) 500 MG tablet Take 2 tablets (1,000 mg total) by mouth 2 (two) times daily as needed.   acetaminophen-codeine (TYLENOL #3) 300-30 MG tablet Take 1 tablet by mouth 2 (two) times daily as needed for moderate pain.   BISACODYL PO Take by mouth.   Blood Glucose Monitoring Suppl (ACCU-CHEK AVIVA PLUS) w/Device KIT Use as directed   Calcium Carbonate-Vitamin D (CALCIUM 600+D) 600-400 MG-UNIT tablet Take 1 tablet by mouth 2 (two) times daily. Lunch and dinner   clotrimazole-betamethasone (LOTRISONE) lotion Apply 1 application topically 2 (two) times daily.   Docusate Sodium (DOK PO) Take 1 tablet by mouth daily.   feeding supplement, GLUCERNA SHAKE, (GLUCERNA SHAKE) LIQD Take 237 mLs by mouth 2 (two) times daily between meals.   Fluticasone-Umeclidin-Vilant (TRELEGY ELLIPTA) 100-62.5-25 MCG/ACT AEPB Inhale 1 puff into the lungs daily. Rinse mouth   furosemide (LASIX) 40 MG tablet TAKE ONE TABLET (40MG) DAILY. MAY TAKE ASECOND DOSE OF 40MG IF NEEDED AT LUNCH FOR 5 DAYS   glucose blood test strip Use to test blood sugar twice daily.   guaiFENesin (MUCINEX) 600 MG 12 hr tablet Take 2 tablets (1,200 mg total) by mouth 2 (two) times daily as needed for cough or to loosen phlegm.   ipratropium-albuterol (DUONEB) 0.5-2.5 (3) MG/3ML SOLN Take 3 mLs by nebulization every 4 (four) hours as needed.   Melatonin 10 MG TABS Take 10 mg by mouth at bedtime.   Multiple Vitamin (MULTIVITAMIN WITH MINERALS) TABS tablet Take 1 tablet by mouth daily.    polyethylene glycol powder (GLYCOLAX/MIRALAX) 17 GM/SCOOP powder Take 17 g by mouth daily.   polyvinyl  alcohol (LIQUIFILM TEARS) 1.4 % ophthalmic solution Place 1 drop into both eyes at bedtime.   [DISCONTINUED] albuterol (PROVENTIL) (2.5 MG/3ML) 0.083% nebulizer solution Take 3 mLs (2.5 mg total) by nebulization in the morning, at noon, and at bedtime.   [DISCONTINUED] apixaban (ELIQUIS) 2.5 MG TABS tablet Take 1 tablet (2.5 mg total) by mouth 2 (two) times daily.   [DISCONTINUED] atorvastatin (LIPITOR) 40 MG tablet Take 1 tablet (40 mg total) by mouth daily at 6 PM. Generic ok (Patient taking differently: Take 40 mg by mouth daily. Generic ok)   [DISCONTINUED] cetirizine (ZYRTEC) 10 MG tablet Take 1 tablet (10 mg total) by mouth daily.   [DISCONTINUED] Fluticasone-Umeclidin-Vilant (TRELEGY ELLIPTA) 100-62.5-25 MCG/INH AEPB Inhale 1 puff into the lungs daily. Rinse mouth d/c symbicort and spiriva   [DISCONTINUED] gabapentin (NEURONTIN) 100 MG capsule TAKE 2 CAPSULES BY MOUTH 3 TIMES DAILY.   [DISCONTINUED] levothyroxine (SYNTHROID) 200 MCG tablet Take 1 tablet (200 mcg total) by mouth daily before breakfast. Except on Sunday. Do not take with other medications or vitamins   [DISCONTINUED] mirtazapine (REMERON) 15 MG tablet Take 1 tablet (15 mg total) by mouth at bedtime.   [DISCONTINUED] pantoprazole (PROTONIX) 40 MG tablet Take 1 tablet (40 mg total) by mouth daily. 30 minutes before lunch or dinner   Allergies  Allergen  Reactions   Penicillins Shortness Of Breath, Rash and Other (See Comments)    Has patient had a PCN reaction causing immediate rash, facial/tongue/throat swelling, SOB or lightheadedness with hypotension: Yes Has patient had a PCN reaction causing severe rash involving mucus membranes or skin necrosis: No Has patient had a PCN reaction that required hospitalization No Has patient had a PCN reaction occurring within the last 10 years: No If all of the above answers are "NO", then may proceed with Cephalosporin use.   Sulfa Antibiotics Shortness Of Breath, Rash and Other (See  Comments)   Amitiza [Lubiprostone]     N/v/d   Aspirin Other (See Comments)    Reaction:  Unknown  Other reaction(s): Bleeding (intolerance) Per patient " causes nose to bleed" Can take 81 mg daily without any complications Other reaction(s): "bloody nose"    Penicillin G Other (See Comments)    Has patient had a PCN reaction causing immediate rash, facial/tongue/throat swelling, SOB or lightheadedness with hypotension: No Has patient had a PCN reaction causing severe rash involving mucus membranes or skin necrosis: Unknown Has patient had a PCN reaction that required hospitalization: Unknown Has patient had a PCN reaction occurring within the last 10 years: Unknown If all of the above answers are "NO", then may proceed with Cephalosporin use.   Recent Results (from the past 2160 hour(s))  POCT glycosylated hemoglobin (Hb A1C)     Status: Abnormal   Collection Time: 01/25/22  3:44 PM  Result Value Ref Range   Hemoglobin A1C 5.7 (A) 4.0 - 5.6 %   HbA1c POC (<> result, manual entry)     HbA1c, POC (prediabetic range)     HbA1c, POC (controlled diabetic range)     Objective  Body mass index is 29.7 kg/m. Wt Readings from Last 3 Encounters:  01/25/22 189 lb 9.6 oz (86 kg)  09/30/21 193 lb (87.5 kg)  09/29/21 193 lb (87.5 kg)   Temp Readings from Last 3 Encounters:  01/25/22 (!) 97.5 F (36.4 C) (Temporal)  09/29/21 97.6 F (36.4 C) (Temporal)  08/19/21 98 F (36.7 C)   BP Readings from Last 3 Encounters:  01/25/22 110/80  09/29/21 122/70  08/19/21 (!) 124/50   Pulse Readings from Last 3 Encounters:  01/25/22 66  09/29/21 86  08/19/21 66    Physical Exam Vitals and nursing note reviewed.  Constitutional:      Appearance: Normal appearance. She is well-developed and well-groomed.  HENT:     Head: Normocephalic and atraumatic.  Eyes:     Conjunctiva/sclera: Conjunctivae normal.     Pupils: Pupils are equal, round, and reactive to light.  Cardiovascular:      Rate and Rhythm: Normal rate and regular rhythm.     Heart sounds: Normal heart sounds. No murmur heard. Pulmonary:     Effort: Pulmonary effort is normal.     Breath sounds: Normal breath sounds.  Abdominal:     General: Abdomen is flat. Bowel sounds are normal.     Tenderness: There is no abdominal tenderness.  Musculoskeletal:        General: No tenderness.  Skin:    General: Skin is warm and dry.  Neurological:     General: No focal deficit present.     Mental Status: She is alert and oriented to person, place, and time. Mental status is at baseline.     Cranial Nerves: Cranial nerves 2-12 are intact.     Gait: Gait abnormal.     Comments:  Has rollator    Psychiatric:        Attention and Perception: Attention and perception normal.        Mood and Affect: Mood and affect normal.        Speech: Speech normal.        Behavior: Behavior normal. Behavior is cooperative.        Thought Content: Thought content normal.        Cognition and Memory: Cognition and memory normal.        Judgment: Judgment normal.    Assessment  Plan  Pulmonary emphysema, unspecified emphysema type ([place]) - Plan: albuterol (PROVENTIL) (2.5 MG/3ML) 0.083% nebulizer solution, Fluticasone-Umeclidin-Vilant (TRELEGY ELLIPTA) 100-62.5-25 MCG/ACT AEPB  Bacteria in urine - Plan: Microalbumin / creatinine urine ratio, Urinalysis, Routine w reflex microscopic, Urine Culture  Hematuria, unspecified type - Plan: Urinalysis, Routine w reflex microscopic, Urine Culture  Prediabetes - Plan: POCT glycosylated hemoglobin (Hb A1C), Microalbumin / creatinine urine ratio  Hypothyroidism, unspecified type - Plan: levothyroxine (SYNTHROID) 200 MCG tablet  History of deep vein thrombosis (DVT) of lower extremity - Plan: apixaban (ELIQUIS) 2.5 MG TABS tablet  Type 2 diabetes mellitus without complication, without long-term current use of insulin (HCC) with CKD 4 now prediabetic- Plan: atorvastatin (LIPITOR) 40 MG  tablet  Allergic rhinitis, unspecified seasonality, unspecified trigger - Plan: cetirizine (ZYRTEC) 10 MG tablet  Trigeminal neuralgia - Plan: gabapentin (NEURONTIN) 200 MG capsule tid  Depression, unspecified depression type - Plan: mirtazapine (REMERON) 15 MG tablet Insomnia, unspecified type - Plan: mirtazapine (REMERON) 15 MG tablet  Gastroesophageal reflux disease - Plan: pantoprazole (PROTONIX) 40 MG tablet   HM Flu utd prevnar had 09/20/16 pna 23 08/29/18 Tdap 08/04/16 shingrix not had out of stock called pharmacy, ? zostavax if had  MMR immune covid 4-5 covid vaccines    Est dermatology  Dr. Evorn Gong  Out of age window pap, colonoscopy, mammo   DEXA 01/01/08 osteopenia will make sure on ca 600 bid and vit D 1000 iu qd -Vitamin d 03/15/18 48.1   Est vascular [facility] Est dermatology Dr. Evorn Gong  Est renal [facility] consult pending   Provider: Dr. Olivia Mackie McLean-Scocuz[facility]

## 2022-01-26 ENCOUNTER — Encounter: Payer: Self-pay | Admitting: Podiatry

## 2022-01-26 DIAGNOSIS — R8271 Bacteriuria: Secondary | ICD-10-CM | POA: Diagnosis not present

## 2022-01-26 DIAGNOSIS — R319 Hematuria, unspecified: Secondary | ICD-10-CM | POA: Diagnosis not present

## 2022-01-26 DIAGNOSIS — R7303 Prediabetes: Secondary | ICD-10-CM | POA: Diagnosis not present

## 2022-01-26 DIAGNOSIS — N184 Chronic kidney disease, stage 4 (severe): Secondary | ICD-10-CM | POA: Diagnosis not present

## 2022-01-26 NOTE — Progress Notes (Signed)
This patient returns to my office for at risk foot care.  This patient requires this care by a professional since this patient will be at risk due to having history of DVT , coagulation defect and diabetes.  Patient is taking eliquis. This patient also has callus on her bunion right foot   This patient is unable to cut nails herself since the patient cannot reach her nails.These nails  and callus are painful walking and wearing shoes.  This patient presents for at risk foot care today.  General Appearance  Alert, conversant and in no acute stress.  Vascular  Dorsalis pedis and posterior tibial  pulses are weakly  palpable due to swelling  bilaterally.  Capillary return is within normal limits  bilaterally. Temperature is within normal limits  bilaterally.  Neurologic  Senn-Weinstein monofilament wire test within normal limits  bilaterally. Muscle power within normal limits bilaterally.  Nails Thick disfigured discolored nails with subungual debris  from hallux to fifth toes bilaterally. No evidence of bacterial infection or drainage bilaterally.  Orthopedic  No limitations of motion  feet .  No crepitus or effusions noted.  No bony pathology or digital deformities noted. HAV  Right.    Skin  normotropic skin with no porokeratosis noted bilaterally.  No signs of infections or ulcers noted.   Callus 1st MPJ right foot.  Onychomycosis  Pain in right toes  Pain in left toes  Callus right foot.  Consent was obtained for treatment procedures.   Mechanical debridement of nails 1-5  bilaterally performed with a nail nipper.  Filed with dremel without incident. Debridement of callus with dremel.   Return office visit   3 months                   Told patient to return for periodic foot care and evaluation due to potential at risk complications. Boneta Lucks D.P.M.

## 2022-01-27 LAB — URINALYSIS, ROUTINE W REFLEX MICROSCOPIC
Bacteria, UA: NONE SEEN /HPF
Bilirubin Urine: NEGATIVE
Glucose, UA: NEGATIVE
Hgb urine dipstick: NEGATIVE
Hyaline Cast: NONE SEEN /LPF
Ketones, ur: NEGATIVE
Nitrite: NEGATIVE
Protein, ur: NEGATIVE
RBC / HPF: NONE SEEN /HPF (ref 0–2)
Specific Gravity, Urine: 1.013 (ref 1.001–1.035)
WBC, UA: NONE SEEN /HPF (ref 0–5)
pH: 7 (ref 5.0–8.0)

## 2022-01-27 LAB — MICROALBUMIN / CREATININE URINE RATIO
Creatinine, Urine: 66 mg/dL (ref 20–275)
Microalb Creat Ratio: 3 mcg/mg creat (ref <30)
Microalb, Ur: 0.2 mg/dL

## 2022-01-27 LAB — URINE CULTURE
MICRO NUMBER:: 13048649
SPECIMEN QUALITY:: ADEQUATE

## 2022-01-27 LAB — MICROSCOPIC MESSAGE

## 2022-02-16 ENCOUNTER — Encounter: Payer: Self-pay | Admitting: Internal Medicine

## 2022-02-16 ENCOUNTER — Ambulatory Visit: Payer: Medicare PPO | Admitting: Internal Medicine

## 2022-02-16 ENCOUNTER — Other Ambulatory Visit: Payer: Self-pay

## 2022-02-16 VITALS — BP 122/82 | HR 71 | Temp 97.9°F | Ht 67.0 in | Wt 189.8 lb

## 2022-02-16 DIAGNOSIS — H6123 Impacted cerumen, bilateral: Secondary | ICD-10-CM | POA: Diagnosis not present

## 2022-02-16 DIAGNOSIS — H60541 Acute eczematoid otitis externa, right ear: Secondary | ICD-10-CM

## 2022-02-16 DIAGNOSIS — M1990 Unspecified osteoarthritis, unspecified site: Secondary | ICD-10-CM

## 2022-02-16 DIAGNOSIS — N3000 Acute cystitis without hematuria: Secondary | ICD-10-CM

## 2022-02-16 MED ORDER — ICY HOT MAX LIDOCAINE 4-1 % EX CREA: 1.0000 "application " | TOPICAL_CREAM | Freq: Three times a day (TID) | CUTANEOUS | 11 refills | Status: DC | PRN

## 2022-02-16 MED ORDER — NEOMYCIN-POLYMYXIN-HC 3.5-10000-1 OT SOLN: 4.0000 [drp] | Freq: Four times a day (QID) | OTIC | 0 refills | Status: DC

## 2022-02-16 NOTE — Progress Notes (Signed)
Chief Complaint  ?Patient presents with  ? Urinary Tract Infection  ? ?F/u with caretaker x 1 year Jessica Erickson  ?1. Dysuria x few days and increased freq urination in the am  ?2. C/o b/l ear wax with baseline hearing issues and right ear yellow wax comes out  ?3. Arthritis wants Rx icey hot sent to pharmacy  ? ? ?Review of Systems  ?Constitutional:  Negative for weight loss.  ?HENT:  Positive for hearing loss.   ?     +ear wax   ?Eyes:  Negative for blurred vision.  ?Respiratory:  Negative for shortness of breath.   ?Cardiovascular:  Negative for chest pain.  ?Gastrointestinal:  Negative for abdominal pain and blood in stool.  ?Genitourinary:  Negative for dysuria.  ?Musculoskeletal:  Negative for falls and joint pain.  ?Skin:  Negative for rash.  ?Neurological:  Negative for headaches.  ?Psychiatric/Behavioral:  Negative for depression.   ?Past Medical History:  ?Diagnosis Date  ? Acoustic neuroma ([place])   ? Allergy   ? Asthma   ? Bilateral swelling of feet   ? and legs  ? Bladder infection   ? CAD (coronary artery disease)   ? Cataract   ? Change in voice   ? Compression fracture of body of thoracic vertebra (HCC)   ? T12 09/18/15 MRI s/p fall   ? Constipation   ? COPD (chronic obstructive pulmonary disease) ([place])   ? previous CXR with chronic interstitial lung dz   ? CVA (cerebral vascular accident) ([place])   ? Depression   ? Diabetes ([place])   ? with neuropathy  ? Diabetes mellitus, type 2 ([place])   ? Diarrhea   ? Double vision   ? DVT (deep venous thrombosis) ([place])   ? right leg 10/2015 was on coumadin off as of 2017/2018 ; s/p IVC filter  ? Enuresis   ? Eye pain, right   ? Fall   ? Fatty liver   ? 09/15/15 also mildly dilated pancreatitic duct rec MRCP small sub cm cyst hemangioma speeln mild right hydronephrorossi and prox. hydroureter, kidney stones, mild scarring kidneys  ? Female stress incontinence   ? Flank pain   ? GERD (gastroesophageal reflux disease)   ? with small hiatal hernia   ? Hard of hearing   ? Heart  disease   ? History of kidney problems   ? Hyperlipidemia   ? mixed  ? Hypertension   ? Hypothyroidism, postsurgical   ? Impaired mobility and ADLs   ? uses rolling walker has caretaker 24/7 at home  ? Leg edema   ? Mixed incontinence urge and stress (female)(female)   ? Neuropathy   ? Osteoarthritis   ? DDD spine   ? Osteoporosis with fracture   ? T12 compression fracture  ? Photophobia   ? Pulmonary embolism ([place])   ? 10/2015 off coumadin as of 04/2016  ? Pulmonary HTN ([place])   ? mild pulm HTN, echo 10/09/15 EF 02-63%ZCHYI 1 dd, RV systolic pressure increased   ? Recurrent UTI   ? Sinus pressure   ? Skin cancer   ? BCC jawline and scalp   ? Thyroid disease   ? follows [facility]  ? TIA (transient ischemic attack)   ? MRI 2009/2010 neg stroke   ? Trigeminal neuralgia   ? Dr. Tomi Bamberger s/p gamma knife x 2, on Tegretol since 2011/2012 no increase in dose >200 mg bid rec per family per neurology   ? Urinary frequency   ?  Urinary, incontinence, stress female   ? Dr Erlene Quan urology   ? ?Past Surgical History:  ?Procedure Laterality Date  ? APPENDECTOMY    ? as a child, open  ? BRAIN SURGERY    ? schwnnoma removal 1996   ? brain tumor surgery    ? BREAST SURGERY    ? breast bx  ? CATARACT EXTRACTION    ? CHOLECYSTECTOMY    ? EYE SURGERY    ? cataract  ? IVC FILTER PLACEMENT ([facility] HX)    ? Dr. Lucky Cowboy 10/2015   ? LAPAROSCOPIC TUBAL LIGATION    ? MOHS SURGERY    ? scalp 04/2014   ? PERIPHERAL VASCULAR CATHETERIZATION N/A 10/11/2015  ? Procedure: IVC Filter Insertion;  Surgeon: Algernon Huxley, MD;  Location: [facility];  Service: Cardiovascular;  Laterality: N/A;  ? PERIPHERAL VASCULAR THROMBECTOMY Bilateral 03/29/2018  ? Procedure: PERIPHERAL VASCULAR THROMBECTOMY;  Surgeon: Algernon Huxley, MD;  Location: [facility];  Service: Cardiovascular;  Laterality: Bilateral;  ? PUBOVAGINAL SLING    ? THROAT SURGERY    ? THYROID SURGERY    ? tumor around vocal cords   ? TOOTH EXTRACTION    ? winter 2018   ? TOTAL  THYROIDECTOMY  1976  ? ?Family History  ?Problem Relation Age of Onset  ? Heart disease Mother   ? Diabetes Father   ? Cancer Daughter   ?     breast ca x 2 s/p mastectomy   ? ?Social History  ? ?Socioeconomic History  ? Marital status: Widowed  ?  Spouse name: Not on file  ? Number of children: 5  ? Years of education: Not on file  ? Highest education level: Not on file  ?Occupational History  ? Occupation: retired  ?Tobacco Use  ? Smoking status: Former  ?  Packs/day: 0.50  ?  Years: 20.00  ?  Pack years: 10.00  ?  Types: Cigarettes  ?  Quit date: 09/20/1995  ?  Years since quitting: 26.4  ? Smokeless tobacco: Never  ? Tobacco comments:  ?  quit 1996 smoked 20 years max 8 cig qd   ?Vaping Use  ? Vaping Use: Never used  ?Substance and Sexual Activity  ? Alcohol use: No  ? Drug use: No  ? Sexual activity: Not on file  ?Other Topics Concern  ? Not on file  ?Social History Narrative  ? Lives at home with [name] son and caretakers come to house   ? Has children   ? Likes to ride stationary bike and sew   ? [name] lives with as of 2022 and he is POA youngest son  ?   ?   ?   ? ?Social Determinants of Health  ? ?Financial Resource Strain: Low Risk   ? Difficulty of Paying Living Expenses: Not hard at all  ?Food Insecurity: No Food Insecurity  ? Worried About Charity fundraiser in the Last Year: Never true  ? Ran Out of Food in the Last Year: Never true  ?Transportation Needs: No Transportation Needs  ? Lack of Transportation (Medical): No  ? Lack of Transportation (Non-Medical): No  ?Physical Activity: Insufficiently Active  ? Days of Exercise per Week: 7 days  ? Minutes of Exercise per Session: 20 min  ?Stress: No Stress Concern Present  ? Feeling of Stress : Only a little  ?Social Connections: Unknown  ? Frequency of Communication with Friends and Family: More than three times a  week  ? Frequency of Social Gatherings with Friends and Family: More than three times a week  ? Attends Religious Services: Not on file  ? Active  Member of Clubs or Organizations: Not on file  ? Attends Archivist Meetings: Not on file  ? Marital Status: Not on file  ?Intimate Partner Violence: Not At Risk  ? Fear of Current or Ex-Partner: No  ? Emotionally Abused: No  ? Physically Abused: No  ? Sexually Abused: No  ? ?Current Meds  ?Medication Sig  ? Accu-Chek Softclix Lancets lancets Use as instructed  ? acetaminophen (TYLENOL) 500 MG tablet Take 2 tablets (1,000 mg total) by mouth 2 (two) times daily as needed.  ? acetaminophen-codeine (TYLENOL #3) 300-30 MG tablet Take 1 tablet by mouth 2 (two) times daily as needed for moderate pain.  ? albuterol (PROVENTIL) (2.5 MG/3ML) 0.083% nebulizer solution Take 3 mLs (2.5 mg total) by nebulization in the morning, at noon, and at bedtime.  ? apixaban (ELIQUIS) 2.5 MG TABS tablet Take 1 tablet (2.5 mg total) by mouth 2 (two) times daily.  ? atorvastatin (LIPITOR) 40 MG tablet Take 1 tablet (40 mg total) by mouth daily at 6 PM. Generic ok  ? BISACODYL PO Take by mouth.  ? Blood Glucose Monitoring Suppl (ACCU-CHEK AVIVA PLUS) w/Device KIT Use as directed  ? Calcium Carbonate-Vitamin D (CALCIUM 600+D) 600-400 MG-UNIT tablet Take 1 tablet by mouth 2 (two) times daily. Lunch and dinner  ? cetirizine (ZYRTEC) 10 MG tablet Take 1 tablet (10 mg total) by mouth daily.  ? clotrimazole-betamethasone (LOTRISONE) lotion Apply 1 application topically 2 (two) times daily.  ? Docusate Sodium (DOK PO) Take 1 tablet by mouth daily.  ? feeding supplement, GLUCERNA SHAKE, (GLUCERNA SHAKE) LIQD Take 237 mLs by mouth 2 (two) times daily between meals.  ? Fluticasone-Umeclidin-Vilant (TRELEGY ELLIPTA) 100-62.5-25 MCG/ACT AEPB Inhale 1 puff into the lungs daily. Rinse mouth  ? furosemide (LASIX) 40 MG tablet TAKE ONE TABLET (40MG) DAILY. MAY TAKE ASECOND DOSE OF 40MG IF NEEDED AT LUNCH FOR 5 DAYS  ? gabapentin (NEURONTIN) 100 MG capsule TAKE 2 CAPSULES BY MOUTH 3 TIMES DAILY.  ? glucose blood test strip Use to test blood sugar  twice daily.  ? guaiFENesin (MUCINEX) 600 MG 12 hr tablet Take 2 tablets (1,200 mg total) by mouth 2 (two) times daily as needed for cough or to loosen phlegm.  ? ipratropium-albuterol (DUONEB) 0.5-2.5 (3) MG/3ML SOLN

## 2022-02-16 NOTE — Patient Instructions (Signed)
Dysuria ?Dysuria is pain or discomfort during urination. The pain or discomfort may be felt in the part of the body that drains urine from the bladder (urethra) or in the surrounding tissue of the genitals. The pain may also be felt in the groin area, lower abdomen, or lower back. ?You may have to urinate frequently or have the sudden feeling that you have to urinate (urgency). Dysuria can affect anyone, but it is more common in females. Dysuria can be caused by many different things, including: ?Urinary tract infection. ?Kidney stones or bladder stones. ?Certain STIs (sexually transmitted infections), such as chlamydia. ?Dehydration. ?Inflammation of the tissues of the vagina. ?Use of certain medicines. ?Use of certain soaps or scented products that cause irritation. ?Follow these instructions at home: ?Medicines ?Take over-the-counter and prescription medicines only as told by your health care provider. ?If you were prescribed an antibiotic medicine, take it as told by your health care provider. Do not stop taking the antibiotic even if you start to feel better. ?Eating and drinking ? ?Drink enough fluid to keep your urine pale yellow. ?Avoid caffeinated beverages, tea, and alcohol. These beverages can irritate the bladder and make dysuria worse. In males, alcohol may irritate the prostate. ?General instructions ?Watch your condition for any changes. ?Urinate often. Avoid holding urine for long periods of time. ?If you are female, you should wipe from front to back after urinating or having a bowel movement. Use each piece of toilet paper only once. ?Empty your bladder after sex. ?Keep all follow-up visits. This is important. ?If you had any tests done to find the cause of dysuria, it is up to you to get your test results. Ask your health care provider, or the department that is doing the test, when your results will be ready. ?Contact a health care provider if: ?You have a fever. ?You develop pain in your back or  sides. ?You have nausea or vomiting. ?You have blood in your urine. ?You are not urinating as often as you usually do. ?Get help right away if: ?Your pain is severe and not relieved with medicines. ?You cannot eat or drink without vomiting. ?You are confused. ?You have a rapid heartbeat while resting. ?You have shaking or chills. ?You feel extremely weak. ?Summary ?Dysuria is pain or discomfort while urinating. Many different conditions can lead to dysuria. ?If you have dysuria, you may have to urinate frequently or have the sudden feeling that you have to urinate (urgency). ?Watch your condition for any changes. Keep all follow-up visits. ?Make sure that you urinate often and drink enough fluid to keep your urine pale yellow. ?This information is not intended to replace advice given to you by your health care provider. Make sure you discuss any questions you have with your health care provider. ?Document Revised: 07/02/2020 Document Reviewed: 07/02/2020 ?Elsevier Patient Education ? 2022 [employer] ? ?

## 2022-02-17 LAB — URINALYSIS, ROUTINE W REFLEX MICROSCOPIC
Bilirubin Urine: NEGATIVE
Glucose, UA: NEGATIVE
Ketones, ur: NEGATIVE
Nitrite: NEGATIVE
Protein, ur: NEGATIVE
Specific Gravity, Urine: 1.012 (ref 1.001–1.035)
pH: 6.5 (ref 5.0–8.0)

## 2022-02-17 LAB — URINE CULTURE
MICRO NUMBER:: 13140203
SPECIMEN QUALITY:: ADEQUATE

## 2022-02-17 LAB — MICROSCOPIC MESSAGE

## 2022-02-21 DIAGNOSIS — D2262 Melanocytic nevi of left upper limb, including shoulder: Secondary | ICD-10-CM | POA: Diagnosis not present

## 2022-02-21 DIAGNOSIS — D485 Neoplasm of uncertain behavior of skin: Secondary | ICD-10-CM | POA: Diagnosis not present

## 2022-02-21 DIAGNOSIS — D2261 Melanocytic nevi of right upper limb, including shoulder: Secondary | ICD-10-CM | POA: Diagnosis not present

## 2022-02-21 DIAGNOSIS — L821 Other seborrheic keratosis: Secondary | ICD-10-CM | POA: Diagnosis not present

## 2022-02-21 DIAGNOSIS — D225 Melanocytic nevi of trunk: Secondary | ICD-10-CM | POA: Diagnosis not present

## 2022-02-21 DIAGNOSIS — D0439 Carcinoma in situ of skin of other parts of face: Secondary | ICD-10-CM | POA: Diagnosis not present

## 2022-04-18 ENCOUNTER — Telehealth: Payer: Self-pay | Admitting: Internal Medicine

## 2022-04-18 DIAGNOSIS — J439 Emphysema, unspecified: Secondary | ICD-10-CM

## 2022-04-19 ENCOUNTER — Other Ambulatory Visit: Payer: Self-pay | Admitting: Internal Medicine

## 2022-04-19 DIAGNOSIS — J439 Emphysema, unspecified: Secondary | ICD-10-CM

## 2022-04-19 MED ORDER — TRELEGY ELLIPTA 100-62.5-25 MCG/ACT IN AEPB: INHALATION_SPRAY | RESPIRATORY_TRACT | 11 refills | Status: DC

## 2022-04-19 NOTE — Telephone Encounter (Signed)
Pt need refill on TRELEGY ELLIPTA [facility]  ?

## 2022-04-27 ENCOUNTER — Ambulatory Visit (INDEPENDENT_AMBULATORY_CARE_PROVIDER_SITE_OTHER): Payer: Medicare PPO | Admitting: Podiatry

## 2022-04-27 ENCOUNTER — Encounter: Payer: Self-pay | Admitting: Podiatry

## 2022-04-27 DIAGNOSIS — L84 Corns and callosities: Secondary | ICD-10-CM

## 2022-04-27 DIAGNOSIS — D689 Coagulation defect, unspecified: Secondary | ICD-10-CM

## 2022-04-27 DIAGNOSIS — M79676 Pain in unspecified toe(s): Secondary | ICD-10-CM

## 2022-04-27 DIAGNOSIS — B351 Tinea unguium: Secondary | ICD-10-CM

## 2022-04-27 DIAGNOSIS — E1140 Type 2 diabetes mellitus with diabetic neuropathy, unspecified: Secondary | ICD-10-CM | POA: Diagnosis not present

## 2022-04-27 NOTE — Progress Notes (Signed)
This patient returns to my office for at risk foot care.  This patient requires this care by a professional since this patient will be at risk due to having history of DVT , coagulation defect and diabetes.  Patient is taking eliquis. This patient also has callus on her bunion right foot   This patient is unable to cut nails herself since the patient cannot reach her nails.These nails  and callus are painful walking and wearing shoes.  This patient presents for at risk foot care today.  General Appearance  Alert, conversant and in no acute stress.  Vascular  Dorsalis pedis and posterior tibial  pulses are weakly  palpable due to swelling  bilaterally.  Capillary return is within normal limits  bilaterally. Temperature is within normal limits  bilaterally.  Neurologic  Senn-Weinstein monofilament wire test within normal limits  bilaterally. Muscle power within normal limits bilaterally.  Nails Thick disfigured discolored nails with subungual debris  from hallux to fifth toes bilaterally. No evidence of bacterial infection or drainage bilaterally.  Orthopedic  No limitations of motion  feet .  No crepitus or effusions noted.  No bony pathology or digital deformities noted. HAV  Right.    Skin  normotropic skin with no porokeratosis noted bilaterally.  No signs of infections or ulcers noted.   Callus 1st MPJ right foot.  Onychomycosis  Pain in right toes  Pain in left toes  Callus right foot.  Consent was obtained for treatment procedures.   Mechanical debridement of nails 1-5  bilaterally performed with a nail nipper.  Filed with dremel without incident. Debridement of callus with dremel.   Return office visit   3 months                   Told patient to return for periodic foot care and evaluation due to potential at risk complications.   Eudell Julian DPM  

## 2022-07-25 ENCOUNTER — Encounter: Payer: Self-pay | Admitting: Internal Medicine

## 2022-07-25 ENCOUNTER — Telehealth: Payer: Self-pay

## 2022-07-25 ENCOUNTER — Ambulatory Visit: Payer: Medicare PPO | Admitting: Internal Medicine

## 2022-07-25 VITALS — BP 118/70 | HR 68 | Temp 98.0°F | Ht 67.0 in | Wt 194.0 lb

## 2022-07-25 DIAGNOSIS — N1831 Chronic kidney disease, stage 3a: Secondary | ICD-10-CM | POA: Diagnosis not present

## 2022-07-25 DIAGNOSIS — Z66 Do not resuscitate: Secondary | ICD-10-CM

## 2022-07-25 DIAGNOSIS — E611 Iron deficiency: Secondary | ICD-10-CM

## 2022-07-25 DIAGNOSIS — E039 Hypothyroidism, unspecified: Secondary | ICD-10-CM

## 2022-07-25 DIAGNOSIS — N1832 Chronic kidney disease, stage 3b: Secondary | ICD-10-CM | POA: Diagnosis not present

## 2022-07-25 DIAGNOSIS — Z23 Encounter for immunization: Secondary | ICD-10-CM

## 2022-07-25 DIAGNOSIS — J471 Bronchiectasis with (acute) exacerbation: Secondary | ICD-10-CM

## 2022-07-25 DIAGNOSIS — E1122 Type 2 diabetes mellitus with diabetic chronic kidney disease: Secondary | ICD-10-CM | POA: Diagnosis not present

## 2022-07-25 MED ORDER — SHINGRIX 50 MCG/0.5ML IM SUSR: 0.5000 mL | Freq: Once | INTRAMUSCULAR | 1 refills | Status: AC

## 2022-07-25 NOTE — Progress Notes (Signed)
Chief Complaint  Patient presents with   Follow-up    5 month f/u   F/u with son [name] pt doing well and being active riding bike at home again w/o leg pain or issues  1.h/o DM 2 off metformin due to CKD  Prediabetes worse rec healthy diet and exercise she is overdue to f/u with eye md    2. Hypothyroidism Tsh low on levo 200 mcg qd  09/29/21 14:32 TSH: 1.46  07/25/22 14:16 TSH: 0.32 (L)   (L): Data is abnormally low  3. Most form filled out today DNR with specifications see scanned in form she and son who is POA agree     Review of Systems  Constitutional:  Negative for weight loss.  HENT:  Negative for hearing loss.   Eyes:  Negative for blurred vision.  Respiratory:  Negative for shortness of breath.   Cardiovascular:  Negative for chest pain.  Gastrointestinal:  Negative for abdominal pain and blood in stool.  Genitourinary:  Negative for dysuria.  Musculoskeletal:  Negative for falls and joint pain.  Skin:  Negative for rash.  Neurological:  Negative for headaches.  Psychiatric/Behavioral:  Negative for depression.    Past Medical History:  Diagnosis Date   Acoustic neuroma (HCC)    Allergy    Asthma    Bilateral swelling of feet    and legs   Bladder infection    CAD (coronary artery disease)    Cataract    Change in voice    Compression fracture of body of thoracic vertebra (HCC)    T12 09/18/15 MRI s/p fall    Constipation    COPD (chronic obstructive pulmonary disease) (HCC)    previous CXR with chronic interstitial lung dz    CVA (cerebral vascular accident) ([place])    Depression    Diabetes ([place])    with neuropathy   Diabetes mellitus, type 2 ([place])    Diarrhea    Double vision    DVT (deep venous thrombosis) ([place])    right leg 10/2015 was on coumadin off as of 2017/2018 ; s/p IVC filter   Enuresis    Eye pain, right    Fall    Fatty liver    09/15/15 also mildly dilated pancreatitic duct rec MRCP small sub cm cyst hemangioma speeln mild right  hydronephrorossi and prox. hydroureter, kidney stones, mild scarring kidneys   Female stress incontinence    Flank pain    GERD (gastroesophageal reflux disease)    with small hiatal hernia    Hard of hearing    Heart disease    History of kidney problems    Hyperlipidemia    mixed   Hypertension    Hypothyroidism, postsurgical    Impaired mobility and ADLs    uses rolling walker has caretaker 24/7 at home   Leg edema    Mixed incontinence urge and stress (female)(female)    Neuropathy    Osteoarthritis    DDD spine    Osteoporosis with fracture    T12 compression fracture   Photophobia    Pulmonary embolism ([place])    10/2015 off coumadin as of 04/2016   Pulmonary HTN (HCC)    mild pulm HTN, echo 10/09/15 EF 67-20%NOBSJ 1 dd, RV systolic pressure increased    Recurrent UTI    Sinus pressure    Skin cancer    BCC jawline and scalp    Thyroid disease    follows [facility]   TIA (transient ischemic  attack)    MRI 2009/2010 neg stroke    Trigeminal neuralgia    Dr. Tomi Bamberger s/p gamma knife x 2, on Tegretol since 2011/2012 no increase in dose >200 mg bid rec per family per neurology    Urinary frequency    Urinary, incontinence, stress female    Dr Erlene Quan urology    Past Surgical History:  Procedure Laterality Date   APPENDECTOMY     as a child, open   [place] removal 1996    brain tumor surgery     BREAST SURGERY     breast bx   CATARACT EXTRACTION     CHOLECYSTECTOMY     EYE SURGERY     cataract   IVC FILTER PLACEMENT ([place] [facility])     Dr. Lucky Cowboy 10/2015    LAPAROSCOPIC TUBAL LIGATION     MOHS SURGERY     scalp 04/2014    PERIPHERAL VASCULAR CATHETERIZATION N/A 10/11/2015   Procedure: IVC Filter Insertion;  Surgeon: Algernon Huxley, MD;  Location: [facility];  Service: Cardiovascular;  Laterality: N/A;   PERIPHERAL VASCULAR THROMBECTOMY Bilateral 03/29/2018   Procedure: PERIPHERAL VASCULAR THROMBECTOMY;  Surgeon: Algernon Huxley, MD;  Location:  [facility];  Service: Cardiovascular;  Laterality: Bilateral;   PUBOVAGINAL SLING     THROAT SURGERY     THYROID SURGERY     tumor around vocal cords    TOOTH EXTRACTION     winter 2018    TOTAL THYROIDECTOMY  1976   Family History  Problem Relation Age of Onset   Heart disease Mother    Diabetes Father    Cancer Daughter        breast ca x 2 s/p mastectomy    Social History   Socioeconomic History   Marital status: Widowed    Spouse name: Not on file   Number of children: 5   Years of education: Not on file   Highest education level: Not on file  Occupational History   Occupation: retired  Tobacco Use   Smoking status: Former    Packs/day: 0.50    Years: 20.00    Total pack years: 10.00    Types: Cigarettes    Quit date: 09/20/1995    Years since quitting: 26.8   Smokeless tobacco: Never   Tobacco comments:    quit 1996 smoked 20 years max 8 cig qd   Vaping Use   Vaping Use: Never used  Substance and Sexual Activity   Alcohol use: No   Drug use: No   Sexual activity: Not on file  Other Topics Concern   Not on file  Social History Narrative   Lives at home with [name] son and caretakers come to house    Has children    Likes to ride stationary bike and sew    [name] lives with as of 2022 and he is POA youngest son            Social Determinants of Radio broadcast assistant Strain: [place]  (09/30/2021)   Overall Financial Resource Strain (CARDIA)    Difficulty of Paying Living Expenses: Not hard at all  Food Insecurity: No Food Insecurity (09/30/2021)   Hunger Vital Sign    Worried About Running Out of Food in the Last Year: Never true    [place] in the Last Year: Never true  Transportation Needs: No Transportation Needs (09/30/2021)   [place] -  Hydrologist (Medical): No    Lack of Transportation (Non-Medical): No  Physical Activity: Insufficiently Active (09/30/2021)   Exercise Vital Sign    Days of  Exercise per Week: 7 days    Minutes of Exercise per Session: 20 min  Stress: No Stress Concern Present (09/30/2021)   [place]    Feeling of Stress : Only a little  Social Connections: Unknown (09/30/2021)   Social Connection and Isolation Panel [NHANES]    Frequency of Communication with Friends and Family: More than three times a week    Frequency of Social Gatherings with Friends and Family: More than three times a week    Attends Religious Services: Not on file    Active Member of Clubs or Organizations: Not on file    Attends Archivist Meetings: Not on file    Marital Status: Not on file  Intimate Partner Violence: Not At Risk (09/30/2021)   Humiliation, Afraid, Rape, and Kick questionnaire    Fear of Current or Ex-Partner: No    Emotionally Abused: No    Physically Abused: No    Sexually Abused: No   Current Meds  Medication Sig   Accu-Chek Softclix Lancets lancets Use as instructed   acetaminophen (TYLENOL) 500 MG tablet Take 2 tablets (1,000 mg total) by mouth 2 (two) times daily as needed.   acetaminophen-codeine (TYLENOL #3) 300-30 MG tablet Take 1 tablet by mouth 2 (two) times daily as needed for moderate pain.   albuterol (PROVENTIL) (2.5 MG/3ML) 0.083% nebulizer solution Take 3 mLs (2.5 mg total) by nebulization in the morning, at noon, and at bedtime.   apixaban (ELIQUIS) 2.5 MG TABS tablet Take 1 tablet (2.5 mg total) by mouth 2 (two) times daily.   atorvastatin (LIPITOR) 40 MG tablet Take 1 tablet (40 mg total) by mouth daily at 6 PM. Generic ok   BISACODYL PO Take by mouth.   Blood Glucose Monitoring Suppl (ACCU-CHEK AVIVA PLUS) w/Device KIT Use as directed   Calcium Carbonate-Vitamin D (CALCIUM 600+D) 600-400 MG-UNIT tablet Take 1 tablet by mouth 2 (two) times daily. Lunch and dinner   cetirizine (ZYRTEC) 10 MG tablet Take 1 tablet (10 mg total) by mouth daily.    clotrimazole-betamethasone (LOTRISONE) lotion Apply 1 application topically 2 (two) times daily.   Docusate Sodium (DOK PO) Take 1 tablet by mouth daily.   feeding supplement, GLUCERNA SHAKE, (GLUCERNA SHAKE) LIQD Take 237 mLs by mouth 2 (two) times daily between meals.   Fluticasone-Umeclidin-Vilant (TRELEGY ELLIPTA) 100-62.5-25 MCG/ACT AEPB INHALE ONE PUFF DAILY. RINSE MOUTH AFTERUSE. STOP SYMBICORT & SPIRIVA   furosemide (LASIX) 40 MG tablet TAKE ONE TABLET (40MG) DAILY. MAY TAKE ASECOND DOSE OF 40MG IF NEEDED AT LUNCH FOR 5 DAYS   gabapentin (NEURONTIN) 100 MG capsule TAKE 2 CAPSULES BY MOUTH 3 TIMES DAILY.   glucose blood test strip Use to test blood sugar twice daily.   guaiFENesin (MUCINEX) 600 MG 12 hr tablet Take 2 tablets (1,200 mg total) by mouth 2 (two) times daily as needed for cough or to loosen phlegm.   ipratropium-albuterol (DUONEB) 0.5-2.5 (3) MG/3ML SOLN Take 3 mLs by nebulization every 4 (four) hours as needed.   Lidocaine-Menthol (ICY HOT MAX LIDOCAINE) 4-1 % CREA Apply 1 application. topically 3 (three) times daily with meals as needed. Dispense cream or spray pt preference   Melatonin 10 MG TABS Take 10 mg by mouth at bedtime.   mirtazapine (  REMERON) 15 MG tablet Take 1 tablet (15 mg total) by mouth at bedtime.   Multiple Vitamin (MULTIVITAMIN WITH MINERALS) TABS tablet Take 1 tablet by mouth daily.    neomycin-polymyxin-hydrocortisone (CORTISPORIN) OTIC solution Place 4 drops into the right ear 4 (four) times daily. X4-7 days   pantoprazole (PROTONIX) 40 MG tablet Take 1 tablet (40 mg total) by mouth daily. 30 minutes before lunch or dinner   polyethylene glycol powder (GLYCOLAX/MIRALAX) 17 GM/SCOOP powder Take 17 g by mouth daily.   polyvinyl alcohol (LIQUIFILM TEARS) 1.4 % ophthalmic solution Place 1 drop into both eyes at bedtime.   [EXPIRED] Zoster Vaccine Adjuvanted [facility]) injection Inject 0.5 mLs into the muscle once for 1 dose.   [DISCONTINUED] levothyroxine  (SYNTHROID) 200 MCG tablet Take 1 tablet (200 mcg total) by mouth daily before breakfast. Except on Sunday. Do not take with other medications or vitamins   Allergies  Allergen Reactions   Penicillins Shortness Of Breath, Rash and Other (See Comments)    Has patient had a PCN reaction causing immediate rash, facial/tongue/throat swelling, SOB or lightheadedness with hypotension: Yes Has patient had a PCN reaction causing severe rash involving mucus membranes or skin necrosis: No Has patient had a PCN reaction that required hospitalization No Has patient had a PCN reaction occurring within the last 10 years: No If all of the above answers are "NO", then may proceed with Cephalosporin use.   Sulfa Antibiotics Shortness Of Breath, Rash and Other (See Comments)   Amitiza [Lubiprostone]     N/v/d   Aspirin Other (See Comments)    Reaction:  Unknown  Other reaction(s): Bleeding (intolerance) Per patient " causes nose to bleed" Can take 81 mg daily without any complications Other reaction(s): "bloody nose"    Penicillin G Other (See Comments)    Has patient had a PCN reaction causing immediate rash, facial/tongue/throat swelling, SOB or lightheadedness with hypotension: No Has patient had a PCN reaction causing severe rash involving mucus membranes or skin necrosis: Unknown Has patient had a PCN reaction that required hospitalization: Unknown Has patient had a PCN reaction occurring within the last 10 years: Unknown If all of the above answers are "NO", then may proceed with Cephalosporin use.   Recent Results (from the past 2160 hour(s))  Comprehensive metabolic panel     Status: Abnormal   Collection Time: 07/25/22  2:16 PM  Result Value Ref Range   Sodium 144 135 - 145 mEq/L   Potassium 4.0 3.5 - 5.1 mEq/L   Chloride 101 96 - 112 mEq/L   CO2 36 (H) 19 - 32 mEq/L   Glucose, Bld 123 (H) 70 - 99 mg/dL   BUN 21 6 - 23 mg/dL   Creatinine, Ser 1.34 (H) 0.40 - 1.20 mg/dL   Total Bilirubin  0.5 0.2 - 1.2 mg/dL   Alkaline Phosphatase 59 39 - 117 U/L   AST 18 0 - 37 U/L   ALT 11 0 - 35 U/L   Total Protein 6.0 6.0 - 8.3 g/dL   Albumin 3.7 3.5 - 5.2 g/dL   GFR 34.00 (L) >60.00 mL/min    Comment: Calculated using the CKD-EPI Creatinine Equation (2021)   Calcium 8.7 8.4 - 10.5 mg/dL  Lipid panel     Status: None   Collection Time: 07/25/22  2:16 PM  Result Value Ref Range   Cholesterol 128 0 - 200 mg/dL    Comment: ATP III Classification       Desirable:  < 200 mg/dL  Borderline High:  200 - 239 mg/dL          High:  > = 240 mg/dL   Triglycerides 88.0 0.0 - 149.0 mg/dL    Comment: Normal:  <150 mg/dLBorderline High:  150 - 199 mg/dL   HDL 52.90 >39.00 mg/dL   VLDL 17.6 0.0 - 40.0 mg/dL   LDL Cholesterol 57 0 - 99 mg/dL   Total CHOL/HDL Ratio 2     Comment:                Men          Women1/2 Average Risk     3.4          3.3Average Risk          5.0          4.42X Average Risk          9.6          7.13X Average Risk          15.0          11.0                       NonHDL 74.98     Comment: NOTE:  Non-HDL goal should be 30 mg/dL higher than patient's LDL goal (i.e. LDL goal of < 70 mg/dL, would have non-HDL goal of < 100 mg/dL)  Hemoglobin A1c     Status: None   Collection Time: 07/25/22  2:16 PM  Result Value Ref Range   Hgb A1c MFr Bld 6.2 4.6 - 6.5 %    Comment: Glycemic Control Guidelines for People with Diabetes:Non Diabetic:  <6%Goal of Therapy: <7%Additional Action Suggested:  >8%   IBC + Ferritin     Status: Abnormal   Collection Time: 07/25/22  2:16 PM  Result Value Ref Range   Iron 69 42 - 145 ug/dL   Transferrin 154.0 (L) 212.0 - 360.0 mg/dL   Saturation Ratios 32.0 20.0 - 50.0 %   Ferritin 12.4 10.0 - 291.0 ng/mL   TIBC 215.6 (L) 250.0 - 450.0 mcg/dL  CBC with Differential/Platelet     Status: Abnormal   Collection Time: 07/25/22  2:16 PM  Result Value Ref Range   WBC 6.4 4.0 - 10.5 K/uL   RBC 4.07 3.87 - 5.11 Mil/uL   Hemoglobin 12.6 12.0 -  15.0 g/dL   HCT 38.5 36.0 - 46.0 %   MCV 94.6 78.0 - 100.0 fl   MCHC 32.7 30.0 - 36.0 g/dL   RDW 14.2 11.5 - 15.5 %   Platelets 143.0 (L) 150.0 - 400.0 K/uL   Neutrophils Relative % 63.9 43.0 - 77.0 %   Lymphocytes Relative 24.3 12.0 - 46.0 %   Monocytes Relative 9.0 3.0 - 12.0 %   Eosinophils Relative 2.0 0.0 - 5.0 %   Basophils Relative 0.8 0.0 - 3.0 %   Neutro Abs 4.1 1.4 - 7.7 K/uL   Lymphs Abs 1.6 0.7 - 4.0 K/uL   Monocytes Absolute 0.6 0.1 - 1.0 K/uL   Eosinophils Absolute 0.1 0.0 - 0.7 K/uL   Basophils Absolute 0.1 0.0 - 0.1 K/uL  TSH     Status: Abnormal   Collection Time: 07/25/22  2:16 PM  Result Value Ref Range   TSH 0.32 (L) 0.35 - 5.50 uIU/mL   Objective  Body mass index is 30.38 kg/m. Wt Readings from Last 3 Encounters:  07/25/22 194 lb (88 kg)  02/16/22 189 lb  12.8 oz (86.1 kg)  01/25/22 189 lb 9.6 oz (86 kg)   Temp Readings from Last 3 Encounters:  07/25/22 98 F (36.7 C) (Oral)  02/16/22 97.9 F (36.6 C) (Oral)  01/25/22 (!) 97.5 F (36.4 C) (Temporal)   BP Readings from Last 3 Encounters:  07/25/22 118/70  02/16/22 122/82  01/25/22 110/80   Pulse Readings from Last 3 Encounters:  07/25/22 68  02/16/22 71  01/25/22 66    Physical Exam Vitals and nursing note reviewed.  Constitutional:      Appearance: Normal appearance. She is well-developed and well-groomed.  HENT:     Head: Normocephalic and atraumatic.  Eyes:     Conjunctiva/sclera: Conjunctivae normal.     Pupils: Pupils are equal, round, and reactive to light.  Cardiovascular:     Rate and Rhythm: Normal rate and regular rhythm.     Heart sounds: Normal heart sounds. No murmur heard. Pulmonary:     Effort: Pulmonary effort is normal.     Breath sounds: Normal breath sounds.  Abdominal:     General: Abdomen is flat. Bowel sounds are normal.     Tenderness: There is no abdominal tenderness.  Musculoskeletal:        General: No tenderness.  Skin:    General: Skin is warm and dry.   Neurological:     General: No focal deficit present.     Mental Status: She is alert and oriented to person, place, and time. Mental status is at baseline.     Cranial Nerves: Cranial nerves 2-12 are intact.     Motor: Motor function is intact.     Coordination: Coordination is intact.     Gait: Gait is intact.  Psychiatric:        Attention and Perception: Attention and perception normal.        Mood and Affect: Mood and affect normal.        Speech: Speech normal.        Behavior: Behavior normal. Behavior is cooperative.        Thought Content: Thought content normal.        Cognition and Memory: Cognition and memory normal.        Judgment: Judgment normal.     Assessment  Plan  Type 2 diabetes mellitus with stage 3a/b chronic kidney disease, without long-term current use of insulin (HCC) - Plan: Comprehensive metabolic panel, Lipid panel, Hemoglobin A1c Established with renal  She is currently w/o metformin due to CKD and controls with diet  Hypothyroidism, unspecified type - Plan: TSH, levothyroxine (SYNTHROID) 175 MCG tablet qd reduce from 200 mcg qd repeat tsh in 6-8 weeks TSH  Iron deficiency - Plan: IBC + Ferritin, CBC with Differential/Platelet  DNR (do not resuscitate) most form filled out and scanned into chart   HM Flu utd prevnar had 09/20/16 pna 23 08/29/18 Prevnar 20 consider 08/30/2023   Tdap 08/04/16 shingrix rx given today MMR immune covid 4-5 covid vaccines    Est dermatology  Dr. Evorn Gong appt 05/2022   Out of age window pap, colonoscopy, mammo    DEXA 01/01/08 osteopenia will make sure on ca 600 bid and vit D 1000 iu qd -Vitamin d 03/15/18 48.1    Est vascular AVV Est dermatology Dr. Evorn Gong  Est renal CCK [place] Established with eye MD   Provider: Dr. Olivia Mackie McLean-Scocuzza-Internal Medicine

## 2022-07-25 NOTE — Patient Instructions (Addendum)
Call and get an eye exam as well  [facility]  Dr. Nicki Reaper or Dr. Volanda Napoleon

## 2022-07-25 NOTE — Telephone Encounter (Signed)
Patient states at check-out that she would like to transfer her care from Dr. Olivia Mackie McLean-Scocuzza to Dr. Einar Pheasant.  Patient is supposed to follow-up with her new doctor in 3-6 months.  This appointment has not been scheduled, pending Dr. Bary Leriche decision.

## 2022-07-25 NOTE — Telephone Encounter (Signed)
If Dr. Nicki Reaper approves then schedule please Thank you

## 2022-07-26 ENCOUNTER — Encounter: Payer: Self-pay | Admitting: Internal Medicine

## 2022-07-26 DIAGNOSIS — E611 Iron deficiency: Secondary | ICD-10-CM | POA: Insufficient documentation

## 2022-07-26 LAB — LIPID PANEL
Cholesterol: 128 mg/dL (ref 0–200)
HDL: 52.9 mg/dL (ref >39.00)
LDL Cholesterol: 57 mg/dL (ref 0–99)
NonHDL: 74.98
Total CHOL/HDL Ratio: 2
Triglycerides: 88 mg/dL (ref 0.0–149.0)
VLDL: 17.6 mg/dL (ref 0.0–40.0)

## 2022-07-26 LAB — COMPREHENSIVE METABOLIC PANEL
ALT: 11 U/L (ref 0–35)
AST: 18 U/L (ref 0–37)
Albumin: 3.7 g/dL (ref 3.5–5.2)
Alkaline Phosphatase: 59 U/L (ref 39–117)
BUN: 21 mg/dL (ref 6–23)
CO2: 36 mEq/L — ABNORMAL HIGH (ref 19–32)
Calcium: 8.7 mg/dL (ref 8.4–10.5)
Chloride: 101 mEq/L (ref 96–112)
Creatinine, Ser: 1.34 mg/dL — ABNORMAL HIGH (ref 0.40–1.20)
GFR: 34 mL/min — ABNORMAL LOW (ref >60.00)
Glucose, Bld: 123 mg/dL — ABNORMAL HIGH (ref 70–99)
Potassium: 4 mEq/L (ref 3.5–5.1)
Sodium: 144 mEq/L (ref 135–145)
Total Bilirubin: 0.5 mg/dL (ref 0.2–1.2)
Total Protein: 6 g/dL (ref 6.0–8.3)

## 2022-07-26 LAB — CBC WITH DIFFERENTIAL/PLATELET
Basophils Absolute: 0.1 10*3/uL (ref 0.0–0.1)
Basophils Relative: 0.8 % (ref 0.0–3.0)
Eosinophils Absolute: 0.1 10*3/uL (ref 0.0–0.7)
Eosinophils Relative: 2 % (ref 0.0–5.0)
HCT: 38.5 % (ref 36.0–46.0)
Hemoglobin: 12.6 g/dL (ref 12.0–15.0)
Lymphocytes Relative: 24.3 % (ref 12.0–46.0)
Lymphs Abs: 1.6 10*3/uL (ref 0.7–4.0)
MCHC: 32.7 g/dL (ref 30.0–36.0)
MCV: 94.6 fl (ref 78.0–100.0)
Monocytes Absolute: 0.6 10*3/uL (ref 0.1–1.0)
Monocytes Relative: 9 % (ref 3.0–12.0)
Neutro Abs: 4.1 10*3/uL (ref 1.4–7.7)
Neutrophils Relative %: 63.9 % (ref 43.0–77.0)
Platelets: 143 10*3/uL — ABNORMAL LOW (ref 150.0–400.0)
RBC: 4.07 Mil/uL (ref 3.87–5.11)
RDW: 14.2 % (ref 11.5–15.5)
WBC: 6.4 10*3/uL (ref 4.0–10.5)

## 2022-07-26 LAB — IBC + FERRITIN
Ferritin: 12.4 ng/mL (ref 10.0–291.0)
Iron: 69 ug/dL (ref 42–145)
Saturation Ratios: 32 % (ref 20.0–50.0)
TIBC: 215.6 ug/dL — ABNORMAL LOW (ref 250.0–450.0)
Transferrin: 154 mg/dL — ABNORMAL LOW (ref 212.0–360.0)

## 2022-07-26 LAB — TSH: TSH: 0.32 u[IU]/mL — ABNORMAL LOW (ref 0.35–5.50)

## 2022-07-26 LAB — HEMOGLOBIN A1C: Hgb A1c MFr Bld: 6.2 % (ref 4.6–6.5)

## 2022-07-26 NOTE — Telephone Encounter (Signed)
See me. Thank you

## 2022-08-01 DIAGNOSIS — Z66 Do not resuscitate: Secondary | ICD-10-CM | POA: Insufficient documentation

## 2022-08-01 MED ORDER — LEVOTHYROXINE SODIUM 175 MCG PO TABS: 175.0000 ug | ORAL_TABLET | Freq: Every day | ORAL | 3 refills | Status: DC

## 2022-08-02 ENCOUNTER — Other Ambulatory Visit: Payer: Self-pay | Admitting: Internal Medicine

## 2022-08-02 DIAGNOSIS — R600 Localized edema: Secondary | ICD-10-CM

## 2022-08-03 ENCOUNTER — Encounter: Payer: Self-pay | Admitting: Podiatry

## 2022-08-03 ENCOUNTER — Ambulatory Visit: Payer: Medicare PPO | Admitting: Podiatry

## 2022-08-03 DIAGNOSIS — E1122 Type 2 diabetes mellitus with diabetic chronic kidney disease: Secondary | ICD-10-CM

## 2022-08-03 DIAGNOSIS — D689 Coagulation defect, unspecified: Secondary | ICD-10-CM

## 2022-08-03 DIAGNOSIS — E1140 Type 2 diabetes mellitus with diabetic neuropathy, unspecified: Secondary | ICD-10-CM

## 2022-08-03 DIAGNOSIS — B351 Tinea unguium: Secondary | ICD-10-CM

## 2022-08-03 DIAGNOSIS — I890 Lymphedema, not elsewhere classified: Secondary | ICD-10-CM

## 2022-08-03 DIAGNOSIS — L84 Corns and callosities: Secondary | ICD-10-CM

## 2022-08-03 DIAGNOSIS — M79676 Pain in unspecified toe(s): Secondary | ICD-10-CM | POA: Diagnosis not present

## 2022-08-03 DIAGNOSIS — D696 Thrombocytopenia, unspecified: Secondary | ICD-10-CM

## 2022-08-03 DIAGNOSIS — N1832 Chronic kidney disease, stage 3b: Secondary | ICD-10-CM

## 2022-08-03 NOTE — Progress Notes (Signed)
This patient returns to my office for at risk foot care.  This patient requires this care by a professional since this patient will be at risk due to having history of DVT , coagulation defect and diabetes.  Patient is taking eliquis. This patient also has callus on her bunion right foot   This patient is unable to cut nails herself since the patient cannot reach her nails.These nails  and callus are painful walking and wearing shoes.  This patient presents for at risk foot care today.  General Appearance  Alert, conversant and in no acute stress.  Vascular  Dorsalis pedis and posterior tibial  pulses are weakly  palpable due to swelling  bilaterally.  Capillary return is within normal limits  bilaterally. Temperature is within normal limits  bilaterally.  Neurologic  Senn-Weinstein monofilament wire test within normal limits  bilaterally. Muscle power within normal limits bilaterally.  Nails Thick disfigured discolored nails with subungual debris  from hallux to fifth toes bilaterally. No evidence of bacterial infection or drainage bilaterally.  Orthopedic  No limitations of motion  feet .  No crepitus or effusions noted.  No bony pathology or digital deformities noted. HAV  Right.    Skin  normotropic skin with no porokeratosis noted bilaterally.  No signs of infections or ulcers noted.   Callus 1st MPJ right foot.  Onychomycosis  Pain in right toes  Pain in left toes  Callus right foot.  Consent was obtained for treatment procedures.   Mechanical debridement of nails 1-5  bilaterally performed with a nail nipper.  Filed with dremel without incident. Debridement of callus with dremel.   Return office visit   3 months                   Told patient to return for periodic foot care and evaluation due to potential at risk complications.   Juno Bozard DPM  

## 2022-08-23 ENCOUNTER — Other Ambulatory Visit: Payer: Self-pay | Admitting: Internal Medicine

## 2022-08-23 MED ORDER — OYSTER SHELL CALCIUM/D3 500-5 MG-MCG PO TABS: 1.0000 | ORAL_TABLET | Freq: Two times a day (BID) | ORAL | 3 refills | Status: DC

## 2022-09-01 ENCOUNTER — Encounter: Payer: Self-pay | Admitting: Internal Medicine

## 2022-09-01 DIAGNOSIS — H9190 Unspecified hearing loss, unspecified ear: Secondary | ICD-10-CM | POA: Insufficient documentation

## 2022-09-06 ENCOUNTER — Other Ambulatory Visit (INDEPENDENT_AMBULATORY_CARE_PROVIDER_SITE_OTHER): Payer: Medicare PPO

## 2022-09-06 DIAGNOSIS — E039 Hypothyroidism, unspecified: Secondary | ICD-10-CM

## 2022-09-07 LAB — TSH: TSH: 0.2 u[IU]/mL — ABNORMAL LOW (ref 0.35–5.50)

## 2022-09-19 NOTE — Telephone Encounter (Signed)
Has appt scheduled.

## 2022-10-02 ENCOUNTER — Encounter (INDEPENDENT_AMBULATORY_CARE_PROVIDER_SITE_OTHER): Payer: Self-pay

## 2022-10-11 ENCOUNTER — Telehealth: Payer: Self-pay

## 2022-10-11 ENCOUNTER — Other Ambulatory Visit: Payer: Self-pay

## 2022-10-11 MED ORDER — ACCU-CHEK SOFTCLIX LANCETS MISC: 12 refills | Status: AC

## 2022-10-11 MED ORDER — GLUCOSE BLOOD VI STRP: ORAL_STRIP | 3 refills | Status: DC

## 2022-10-11 NOTE — Telephone Encounter (Signed)
Received call from [facility]  stating pt needs RF's on her Accu-Chek Softclix Lancets lancets & glucose blood test strip ASAP pt has been out for awhile.

## 2022-10-11 NOTE — Telephone Encounter (Signed)
[facility] called stating pt needed a refill on test strips and lancets. Pt stated she has been out of them for awhile.]  Refills were sent.

## 2022-10-11 NOTE — Telephone Encounter (Signed)
Refills sent

## 2022-10-23 ENCOUNTER — Ambulatory Visit
Admission: EM | Admit: 2022-10-23 | Discharge: 2022-10-23 | Disposition: A | Discharge status: Home / Self Care | Payer: Medicare PPO | Attending: Emergency Medicine | Admitting: Emergency Medicine

## 2022-10-23 ENCOUNTER — Telehealth: Payer: Self-pay | Admitting: Internal Medicine

## 2022-10-23 ENCOUNTER — Other Ambulatory Visit: Payer: Self-pay

## 2022-10-23 ENCOUNTER — Inpatient Hospital Stay
Admission: EM | Admit: 2022-10-23 | Discharge: 2022-10-27 | DRG: 690 | Disposition: A | Discharge status: Home Health | Payer: Medicare PPO | Attending: Internal Medicine | Admitting: Internal Medicine

## 2022-10-23 DIAGNOSIS — Z8249 Family history of ischemic heart disease and other diseases of the circulatory system: Secondary | ICD-10-CM

## 2022-10-23 DIAGNOSIS — R531 Weakness: Secondary | ICD-10-CM | POA: Diagnosis not present

## 2022-10-23 DIAGNOSIS — R54 Age-related physical debility: Secondary | ICD-10-CM | POA: Diagnosis present

## 2022-10-23 DIAGNOSIS — Z79890 Hormone replacement therapy (postmenopausal): Secondary | ICD-10-CM

## 2022-10-23 DIAGNOSIS — E890 Postprocedural hypothyroidism: Secondary | ICD-10-CM | POA: Diagnosis present

## 2022-10-23 DIAGNOSIS — K219 Gastro-esophageal reflux disease without esophagitis: Secondary | ICD-10-CM

## 2022-10-23 DIAGNOSIS — J9611 Chronic respiratory failure with hypoxia: Secondary | ICD-10-CM | POA: Diagnosis present

## 2022-10-23 DIAGNOSIS — I129 Hypertensive chronic kidney disease with stage 1 through stage 4 chronic kidney disease, or unspecified chronic kidney disease: Secondary | ICD-10-CM | POA: Diagnosis present

## 2022-10-23 DIAGNOSIS — Z888 Allergy status to other drugs, medicaments and biological substances status: Secondary | ICD-10-CM

## 2022-10-23 DIAGNOSIS — Z833 Family history of diabetes mellitus: Secondary | ICD-10-CM

## 2022-10-23 DIAGNOSIS — Z79899 Other long term (current) drug therapy: Secondary | ICD-10-CM

## 2022-10-23 DIAGNOSIS — N3001 Acute cystitis with hematuria: Secondary | ICD-10-CM | POA: Insufficient documentation

## 2022-10-23 DIAGNOSIS — Z7901 Long term (current) use of anticoagulants: Secondary | ICD-10-CM

## 2022-10-23 DIAGNOSIS — Z886 Allergy status to analgesic agent status: Secondary | ICD-10-CM

## 2022-10-23 DIAGNOSIS — R112 Nausea with vomiting, unspecified: Secondary | ICD-10-CM | POA: Diagnosis present

## 2022-10-23 DIAGNOSIS — Z86718 Personal history of other venous thrombosis and embolism: Secondary | ICD-10-CM

## 2022-10-23 DIAGNOSIS — E785 Hyperlipidemia, unspecified: Secondary | ICD-10-CM | POA: Insufficient documentation

## 2022-10-23 DIAGNOSIS — Z9981 Dependence on supplemental oxygen: Secondary | ICD-10-CM

## 2022-10-23 DIAGNOSIS — Z882 Allergy status to sulfonamides status: Secondary | ICD-10-CM

## 2022-10-23 DIAGNOSIS — N390 Urinary tract infection, site not specified: Secondary | ICD-10-CM | POA: Diagnosis not present

## 2022-10-23 DIAGNOSIS — Z20822 Contact with and (suspected) exposure to covid-19: Secondary | ICD-10-CM | POA: Diagnosis present

## 2022-10-23 DIAGNOSIS — Z87891 Personal history of nicotine dependence: Secondary | ICD-10-CM

## 2022-10-23 DIAGNOSIS — J4489 Other specified chronic obstructive pulmonary disease: Secondary | ICD-10-CM | POA: Diagnosis present

## 2022-10-23 DIAGNOSIS — E039 Hypothyroidism, unspecified: Secondary | ICD-10-CM

## 2022-10-23 DIAGNOSIS — E8770 Fluid overload, unspecified: Secondary | ICD-10-CM | POA: Diagnosis not present

## 2022-10-23 DIAGNOSIS — N1831 Chronic kidney disease, stage 3a: Secondary | ICD-10-CM | POA: Diagnosis present

## 2022-10-23 DIAGNOSIS — Z7951 Long term (current) use of inhaled steroids: Secondary | ICD-10-CM

## 2022-10-23 DIAGNOSIS — H9190 Unspecified hearing loss, unspecified ear: Secondary | ICD-10-CM | POA: Diagnosis present

## 2022-10-23 DIAGNOSIS — Z9049 Acquired absence of other specified parts of digestive tract: Secondary | ICD-10-CM

## 2022-10-23 DIAGNOSIS — Z8673 Personal history of transient ischemic attack (TIA), and cerebral infarction without residual deficits: Secondary | ICD-10-CM

## 2022-10-23 DIAGNOSIS — Z880 Allergy status to penicillin: Secondary | ICD-10-CM

## 2022-10-23 DIAGNOSIS — R627 Adult failure to thrive: Secondary | ICD-10-CM | POA: Diagnosis present

## 2022-10-23 DIAGNOSIS — Z8669 Personal history of other diseases of the nervous system and sense organs: Secondary | ICD-10-CM

## 2022-10-23 DIAGNOSIS — I2510 Atherosclerotic heart disease of native coronary artery without angina pectoris: Secondary | ICD-10-CM | POA: Diagnosis present

## 2022-10-23 DIAGNOSIS — E1122 Type 2 diabetes mellitus with diabetic chronic kidney disease: Secondary | ICD-10-CM | POA: Diagnosis present

## 2022-10-23 DIAGNOSIS — F32A Depression, unspecified: Secondary | ICD-10-CM | POA: Diagnosis present

## 2022-10-23 DIAGNOSIS — Z66 Do not resuscitate: Secondary | ICD-10-CM | POA: Diagnosis present

## 2022-10-23 DIAGNOSIS — R600 Localized edema: Secondary | ICD-10-CM | POA: Diagnosis present

## 2022-10-23 DIAGNOSIS — E860 Dehydration: Secondary | ICD-10-CM | POA: Diagnosis present

## 2022-10-23 LAB — COMPREHENSIVE METABOLIC PANEL
ALT: 19 U/L (ref 0–44)
AST: 30 U/L (ref 15–41)
Albumin: 3.7 g/dL (ref 3.5–5.0)
Alkaline Phosphatase: 62 U/L (ref 38–126)
Anion gap: 8 (ref 5–15)
BUN: 23 mg/dL (ref 8–23)
CO2: 34 mmol/L — ABNORMAL HIGH (ref 22–32)
Calcium: 8.5 mg/dL — ABNORMAL LOW (ref 8.9–10.3)
Chloride: 102 mmol/L (ref 98–111)
Creatinine, Ser: 1.29 mg/dL — ABNORMAL HIGH (ref 0.44–1.00)
GFR, Estimated: 38 mL/min — ABNORMAL LOW (ref >60)
Glucose, Bld: 163 mg/dL — ABNORMAL HIGH (ref 70–99)
Potassium: 4.2 mmol/L (ref 3.5–5.1)
Sodium: 144 mmol/L (ref 135–145)
Total Bilirubin: 1 mg/dL (ref 0.3–1.2)
Total Protein: 6.8 g/dL (ref 6.5–8.1)

## 2022-10-23 LAB — CBC
HCT: 41.9 % (ref 36.0–46.0)
Hemoglobin: 13.3 g/dL (ref 12.0–15.0)
MCH: 30.6 pg (ref 26.0–34.0)
MCHC: 31.7 g/dL (ref 30.0–36.0)
MCV: 96.3 fL (ref 80.0–100.0)
Platelets: 157 10*3/uL (ref 150–400)
RBC: 4.35 MIL/uL (ref 3.87–5.11)
RDW: 13 % (ref 11.5–15.5)
WBC: 8.8 10*3/uL (ref 4.0–10.5)
nRBC: 0 % (ref 0.0–0.2)

## 2022-10-23 LAB — POCT URINALYSIS DIP (MANUAL ENTRY)
Bilirubin, UA: NEGATIVE
Glucose, UA: NEGATIVE mg/dL
Ketones, POC UA: NEGATIVE mg/dL
Nitrite, UA: NEGATIVE
Protein Ur, POC: NEGATIVE mg/dL
Spec Grav, UA: 1.02 (ref 1.010–1.025)
Urobilinogen, UA: 0.2 E.U./dL
pH, UA: 7 (ref 5.0–8.0)

## 2022-10-23 MED ORDER — ONDANSETRON 4 MG PO TBDP: 4.0000 mg | ORAL_TABLET | Freq: Once | ORAL | Status: DC

## 2022-10-23 MED ORDER — ONDANSETRON 4 MG PO TBDP: 4.0000 mg | ORAL_TABLET | Freq: Three times a day (TID) | ORAL | 0 refills | Status: DC | PRN

## 2022-10-23 MED ORDER — SODIUM CHLORIDE 0.9 % IV BOLUS
500.0000 mL | Freq: Once | INTRAVENOUS | Status: AC
  Administered 2022-10-24: 500 mL via INTRAVENOUS

## 2022-10-23 MED ORDER — ONDANSETRON HCL 4 MG/2ML IJ SOLN
4.0000 mg | Freq: Once | INTRAMUSCULAR | Status: AC
  Administered 2022-10-23: 4 mg via INTRAMUSCULAR

## 2022-10-23 MED ORDER — FOSFOMYCIN TROMETHAMINE 3 G PO PACK: 3.0000 g | PACK | Freq: Once | ORAL | 0 refills | Status: AC

## 2022-10-23 NOTE — ED Triage Notes (Signed)
Pt. Presents to UC w/ c/o nausea and emesis that started today. Pt's son states emesis contents have been yellow and patient has been unable to keep food and fluids down.

## 2022-10-23 NOTE — Telephone Encounter (Signed)
Pt son would like to come and pick up a specimen cup because he thinks pt may have a UTI. Pt son would like to be called

## 2022-10-23 NOTE — ED Triage Notes (Signed)
Pt arrives EMS from home with reports of ongoing UTI and fatigue. Per EMS pt son was concerned about the decrease in urine output. Pt was seen and dx at urgent care today. Pt is deaf and communicates by writing.

## 2022-10-23 NOTE — Telephone Encounter (Signed)
Patient's family member called, no name given. She stated that patient is very sick, not drinking or eating, fever 99.7. Caller was very up set because office could not get patient into see a provider until Wednesday. When ask if any other symptoms, caller would state I can't believe you can not get her in today, she is very sick. Office was unable to triage patient because caller was not with patient, she was in her car. Office did advise that if she is very sick to take her today to Urgent care or ED.

## 2022-10-23 NOTE — Telephone Encounter (Signed)
See other phone note which was sent to doc of the Day Arnett,.

## 2022-10-23 NOTE — ED Provider Notes (Signed)
[facility] Provider Note    Event Date/Time   First MD Initiated Contact with Patient 10/23/22 2302     (approximate)  History   Chief Complaint: Urinary tract infection  HPI  Jessica Erickson is a 88 y.o. female with a past medical history of CAD, COPD, CVA, diabetes, hypertension, hyperlipidemia, presents to the emergency department for concerns over urinary tract infection.  According to EMS the patient lives at home with her son, son was concerned due to discoloration of the urine for possible urinary tract infection.  Went to an urgent care earlier today had antibiotics prescribed for urinary tract infection.  Son noted that the patient's urine output appeared to decrease throughout the day and he was concerned so he had EMS bring the patient to the emergency department for evaluation.  No known fever, afebrile in the emergency department.  Patient is awake alert calm cooperative and pleasant.  Very hard of hearing/deaf.  Physical Exam   Most recent vital signs: There were no vitals filed for this visit.  General: Awake, no distress.  CV:  Good peripheral perfusion.  Regular rate and rhythm  Resp:  Normal effort.  Equal breath sounds bilaterally.  Abd:  No distention.  Soft, nontender.  No rebound or guarding.  ED Results / Procedures / Treatments   EKG  EKG viewed and interpreted by myself shows a normal sinus rhythm versus atrial fibrillation at 70 bpm with a narrow QRS, normal axis, normal intervals, nonspecific ST changes.  R  MEDICATIONS ORDERED IN ED: Medications  sodium chloride 0.9 % bolus 500 mL (has no administration in time range)     IMPRESSION / MDM / ASSESSMENT AND PLAN / ED COURSE  I reviewed the triage vital signs and the nursing notes.  Patient's presentation is most consistent with acute presentation with potential threat to life or bodily function.  Patient presents to the emergency department for concern of urinary tract  infection with decreased urinary output.  Overall the patient appears well, no distress, benign abdomen, reassuring vitals.  We will check labs, urine, IV hydrate and closely monitor.  Given the report of decreased urine output we will check labs to ensure normal renal function.  We will continue to closely monitor while awaiting results.  Awaiting family arrival for further history.  Son is now here who states the patient has been very weak today and the home health aide noted some blood in her urine.  He brought the patient to the urgent care was diagnosed with urinary tract infection was prescribed an antibiotic however attempted to give the antibiotic and the patient vomited.  Patient has vomited a couple times at home is unable to keep down any fluids.  Son was concerned so he brought the patient to the emergency department.  I reviewed the patient's urinalysis from the urgent care which showed small amount of leukocytes and blood but no obvious bacteria.  Urine culture was sent from urgent care.  Given the possibility of urinary tract infection we will cover with Rocephin.  Patient has a penicillin allergy but no documented cephalosporin allergy and should be very low cross-reactivity with a third-generation cephalosporin.  Patient's lab work is overall reassuring does have mild renal insufficiency but largely unchanged from 3 months ago on her chemistry.  CBC is normal with a normal white blood cell count and lactic acid is normal.  We will attempt to obtain a urine sample in the emergency department we will also  check for COVID/influenza.  We will dose IV antibiotics we will IV hydrate and treat with Zofran.  Given the patient's age and comorbidities we will likely admit for weakness nausea vomiting given her inability to keep down any antibiotics.  COVID/flu is negative.  Given the patient's weakness report of nausea vomiting and urine concerning for possible urinary tract infection at the urgent care  earlier today we will admit for IV antibiotics fluids and closely monitor.  Son agreeable to plan.  FINAL CLINICAL IMPRESSION(S) / ED DIAGNOSES   Urinary tract infection Nausea vomiting Weakness  Note:  This document was prepared using Dragon voice recognition software and may include unintentional dictation errors.   Harvest Dark, MD 10/24/22 [phone]

## 2022-10-23 NOTE — Discharge Instructions (Addendum)
Thank you for your assistance in helping your mother provide a urine sample today.  It does appear that she may have a urinary tract infection.  We will order a urine culture to confirm this.  In the meantime, recommend that she begins a an antibiotic called fosfomycin which is a single dose antibiotic that effectively treats the most common cause of urinary tract infections, E. coli.  I have sent a prescription to your pharmacy.  Once we receive the results of her urine culture, we will contact you for a phone to inform you of those results and provide you with any further recommendations or prescriptions as needed.  I have also sent a prescription for Zofran to your pharmacy that you can give her every 8 hours as needed for nausea.  She also received a dose here in the clinic before you left today.  Thank you for bringing her to urgent care today.  We hope she feels better soon.

## 2022-10-24 ENCOUNTER — Encounter: Payer: Self-pay | Admitting: Family Medicine

## 2022-10-24 DIAGNOSIS — N390 Urinary tract infection, site not specified: Secondary | ICD-10-CM

## 2022-10-24 DIAGNOSIS — E039 Hypothyroidism, unspecified: Secondary | ICD-10-CM

## 2022-10-24 DIAGNOSIS — E785 Hyperlipidemia, unspecified: Secondary | ICD-10-CM

## 2022-10-24 DIAGNOSIS — R112 Nausea with vomiting, unspecified: Secondary | ICD-10-CM | POA: Diagnosis not present

## 2022-10-24 DIAGNOSIS — K219 Gastro-esophageal reflux disease without esophagitis: Secondary | ICD-10-CM | POA: Diagnosis not present

## 2022-10-24 LAB — URINALYSIS, COMPLETE (UACMP) WITH MICROSCOPIC
Bacteria, UA: NONE SEEN
Bilirubin Urine: NEGATIVE
Glucose, UA: NEGATIVE mg/dL
Hgb urine dipstick: NEGATIVE
Ketones, ur: NEGATIVE mg/dL
Nitrite: NEGATIVE
Protein, ur: NEGATIVE mg/dL
Specific Gravity, Urine: 1.02 (ref 1.005–1.030)
pH: 5 (ref 5.0–8.0)

## 2022-10-24 LAB — CBC
HCT: 37.5 % (ref 36.0–46.0)
Hemoglobin: 11.8 g/dL — ABNORMAL LOW (ref 12.0–15.0)
MCH: 30.4 pg (ref 26.0–34.0)
MCHC: 31.5 g/dL (ref 30.0–36.0)
MCV: 96.6 fL (ref 80.0–100.0)
Platelets: 138 10*3/uL — ABNORMAL LOW (ref 150–400)
RBC: 3.88 MIL/uL (ref 3.87–5.11)
RDW: 13.1 % (ref 11.5–15.5)
WBC: 6.8 10*3/uL (ref 4.0–10.5)
nRBC: 0 % (ref 0.0–0.2)

## 2022-10-24 LAB — BASIC METABOLIC PANEL
Anion gap: 8 (ref 5–15)
BUN: 22 mg/dL (ref 8–23)
CO2: 31 mmol/L (ref 22–32)
Calcium: 7.8 mg/dL — ABNORMAL LOW (ref 8.9–10.3)
Chloride: 106 mmol/L (ref 98–111)
Creatinine, Ser: 1.28 mg/dL — ABNORMAL HIGH (ref 0.44–1.00)
GFR, Estimated: 39 mL/min — ABNORMAL LOW (ref >60)
Glucose, Bld: 122 mg/dL — ABNORMAL HIGH (ref 70–99)
Potassium: 4.2 mmol/L (ref 3.5–5.1)
Sodium: 145 mmol/L (ref 135–145)

## 2022-10-24 LAB — RESP PANEL BY RT-PCR (FLU A&B, COVID) ARPGX2
Influenza A by PCR: NEGATIVE
Influenza B by PCR: NEGATIVE
SARS Coronavirus 2 by RT PCR: NEGATIVE

## 2022-10-24 LAB — LACTIC ACID, PLASMA: Lactic Acid, Venous: 1.2 mmol/L (ref 0.5–1.9)

## 2022-10-24 MED ORDER — IPRATROPIUM-ALBUTEROL 0.5-2.5 (3) MG/3ML IN SOLN
3.0000 mL | RESPIRATORY_TRACT | Status: DC | PRN
  Filled 2022-10-24: qty 3

## 2022-10-24 MED ORDER — ONDANSETRON HCL 4 MG PO TABS: 4.0000 mg | ORAL_TABLET | Freq: Four times a day (QID) | ORAL | Status: DC | PRN

## 2022-10-24 MED ORDER — ACETAMINOPHEN 325 MG PO TABS
650.0000 mg | ORAL_TABLET | Freq: Four times a day (QID) | ORAL | Status: DC | PRN
  Administered 2022-10-26 (×2): 650 mg via ORAL
  Filled 2022-10-24 (×2): qty 2

## 2022-10-24 MED ORDER — ADULT MULTIVITAMIN W/MINERALS CH
1.0000 | ORAL_TABLET | Freq: Every day | ORAL | Status: DC
  Administered 2022-10-24 – 2022-10-27 (×4): 1 via ORAL
  Filled 2022-10-24 (×3): qty 1

## 2022-10-24 MED ORDER — ONDANSETRON HCL 4 MG/2ML IJ SOLN
4.0000 mg | Freq: Once | INTRAMUSCULAR | Status: AC
  Administered 2022-10-24: 4 mg via INTRAVENOUS
  Filled 2022-10-24: qty 2

## 2022-10-24 MED ORDER — APIXABAN 2.5 MG PO TABS
2.5000 mg | ORAL_TABLET | Freq: Two times a day (BID) | ORAL | Status: DC
  Filled 2022-10-24: qty 1

## 2022-10-24 MED ORDER — ACETAMINOPHEN 650 MG RE SUPP: 650.0000 mg | Freq: Four times a day (QID) | RECTAL | Status: DC | PRN

## 2022-10-24 MED ORDER — APIXABAN 2.5 MG PO TABS
2.5000 mg | ORAL_TABLET | Freq: Two times a day (BID) | ORAL | Status: DC
  Administered 2022-10-24 – 2022-10-27 (×7): 2.5 mg via ORAL
  Filled 2022-10-24 (×7): qty 1

## 2022-10-24 MED ORDER — FLUTICASONE FUROATE-VILANTEROL 100-25 MCG/ACT IN AEPB
1.0000 | INHALATION_SPRAY | Freq: Every day | RESPIRATORY_TRACT | Status: DC
  Administered 2022-10-24 – 2022-10-27 (×4): 1 via RESPIRATORY_TRACT
  Filled 2022-10-24: qty 28

## 2022-10-24 MED ORDER — PANTOPRAZOLE SODIUM 40 MG PO TBEC
40.0000 mg | DELAYED_RELEASE_TABLET | Freq: Every day | ORAL | Status: DC
  Administered 2022-10-24 – 2022-10-27 (×4): 40 mg via ORAL
  Filled 2022-10-24 (×4): qty 1

## 2022-10-24 MED ORDER — SODIUM CHLORIDE 0.9 % IV SOLN
1.0000 g | INTRAVENOUS | Status: DC
  Administered 2022-10-25: 1 g via INTRAVENOUS
  Filled 2022-10-24: qty 1

## 2022-10-24 MED ORDER — POLYVINYL ALCOHOL 1.4 % OP SOLN: 1.0000 [drp] | Freq: Every day | OPHTHALMIC | Status: DC

## 2022-10-24 MED ORDER — TRAZODONE HCL 50 MG PO TABS: 25.0000 mg | ORAL_TABLET | Freq: Every evening | ORAL | Status: DC | PRN

## 2022-10-24 MED ORDER — UMECLIDINIUM BROMIDE 62.5 MCG/ACT IN AEPB
1.0000 | INHALATION_SPRAY | Freq: Every day | RESPIRATORY_TRACT | Status: DC
  Administered 2022-10-25 – 2022-10-27 (×3): 1 via RESPIRATORY_TRACT
  Filled 2022-10-24: qty 7

## 2022-10-24 MED ORDER — SODIUM CHLORIDE 0.9 % IV SOLN
1.0000 g | Freq: Once | INTRAVENOUS | Status: AC
  Administered 2022-10-24: 1 g via INTRAVENOUS
  Filled 2022-10-24: qty 10

## 2022-10-24 MED ORDER — GABAPENTIN 100 MG PO CAPS
100.0000 mg | ORAL_CAPSULE | Freq: Three times a day (TID) | ORAL | Status: DC
  Administered 2022-10-24 – 2022-10-26 (×7): 100 mg via ORAL
  Filled 2022-10-24 (×7): qty 1

## 2022-10-24 MED ORDER — LORATADINE 10 MG PO TABS
10.0000 mg | ORAL_TABLET | Freq: Every day | ORAL | Status: DC
  Administered 2022-10-24 – 2022-10-27 (×4): 10 mg via ORAL
  Filled 2022-10-24 (×4): qty 1

## 2022-10-24 MED ORDER — ATORVASTATIN CALCIUM 20 MG PO TABS
40.0000 mg | ORAL_TABLET | Freq: Every day | ORAL | Status: DC
  Administered 2022-10-24 – 2022-10-26 (×3): 40 mg via ORAL
  Filled 2022-10-24 (×3): qty 2

## 2022-10-24 MED ORDER — GLUCERNA SHAKE PO LIQD
237.0000 mL | Freq: Two times a day (BID) | ORAL | Status: DC
  Administered 2022-10-24 – 2022-10-27 (×5): 237 mL via ORAL

## 2022-10-24 MED ORDER — OYSTER SHELL CALCIUM/D3 500-5 MG-MCG PO TABS
1.0000 | ORAL_TABLET | Freq: Two times a day (BID) | ORAL | Status: DC
  Administered 2022-10-24 – 2022-10-27 (×7): 1 via ORAL
  Filled 2022-10-24 (×7): qty 1

## 2022-10-24 MED ORDER — POLYETHYLENE GLYCOL 3350 17 G PO PACK
17.0000 g | PACK | Freq: Every day | ORAL | Status: DC
  Administered 2022-10-24 – 2022-10-27 (×3): 17 g via ORAL
  Filled 2022-10-24 (×4): qty 1

## 2022-10-24 MED ORDER — ONDANSETRON HCL 4 MG/2ML IJ SOLN: 4.0000 mg | Freq: Four times a day (QID) | INTRAMUSCULAR | Status: DC | PRN

## 2022-10-24 MED ORDER — MAGNESIUM HYDROXIDE 400 MG/5ML PO SUSP
30.0000 mL | Freq: Every day | ORAL | Status: DC | PRN
  Administered 2022-10-27: 30 mL via ORAL
  Filled 2022-10-24: qty 30

## 2022-10-24 MED ORDER — SODIUM CHLORIDE 0.9 % IV SOLN: INTRAVENOUS | Status: DC

## 2022-10-24 MED ORDER — GUAIFENESIN ER 600 MG PO TB12: 1200.0000 mg | ORAL_TABLET | Freq: Two times a day (BID) | ORAL | Status: DC | PRN

## 2022-10-24 MED ORDER — SODIUM CHLORIDE 0.9 % IV SOLN: 1.0000 g | Freq: Once | INTRAVENOUS | Status: DC

## 2022-10-24 MED ORDER — MIRTAZAPINE 15 MG PO TABS
15.0000 mg | ORAL_TABLET | Freq: Every day | ORAL | Status: DC
  Administered 2022-10-24 – 2022-10-26 (×3): 15 mg via ORAL
  Filled 2022-10-24 (×3): qty 1

## 2022-10-24 MED ORDER — LEVOTHYROXINE SODIUM 50 MCG PO TABS
175.0000 ug | ORAL_TABLET | Freq: Every day | ORAL | Status: DC
  Administered 2022-10-24 – 2022-10-27 (×4): 175 ug via ORAL
  Filled 2022-10-24 (×3): qty 1
  Filled 2022-10-24: qty 4

## 2022-10-24 MED ORDER — MELATONIN 5 MG PO TABS: 10.0000 mg | ORAL_TABLET | Freq: Every evening | ORAL | Status: DC | PRN

## 2022-10-24 MED ORDER — FUROSEMIDE 40 MG PO TABS
40.0000 mg | ORAL_TABLET | Freq: Every day | ORAL | Status: DC
  Administered 2022-10-24: 40 mg via ORAL
  Filled 2022-10-24: qty 1

## 2022-10-24 NOTE — Assessment & Plan Note (Signed)
-   The patient be admitted to a medical observation bed. - We will place on IV Rocephin, especially given her nausea and vomiting.. - We will follow urine culture and sensitivity.

## 2022-10-24 NOTE — ED Notes (Signed)
Pt appears to be sleeping, observe even RR and unlabored, NAD noted, side rails up x2 for safety, plan of care ongoing, call light within reach, no further concerns as of present.  No urine output in suction canister at this time, skin warm and dry.

## 2022-10-24 NOTE — Assessment & Plan Note (Signed)
-   We will continue Synthroid. 

## 2022-10-24 NOTE — Progress Notes (Signed)
Pt seen and admitted by Dr Sidney Ace, please see note for details.    Jessica Erickson is a 88 y.o. Caucasian female with medical history significant for acoustic neuroma and deafness, asthma, CAD, COPD, CVA, depression, type 2 diabetes mellitus, and GERD, who presented to the ER with acute onset of generalized weakness and suspected urinary tract infection.    General exam: Appears calm and comfortable  Respiratory system: Clear to auscultation. Respiratory effort normal. Cardiovascular system: S1 & S2 heard, RRR.  No pedal edema. Gastrointestinal system: Abdomen is nondistended, soft and nontender.  Central nervous system: Alert , but very hard of hearing.  Extremities: Symmetric 5 x 5 power. Skin: No rashes, lesions or ulcers Psychiatry: unable to assess.    Plan:   Continue with IV rocephin and IV fluids.  Therapy evaluations ordered.  Follow up cultures.   Hosie Poisson MD

## 2022-10-24 NOTE — Assessment & Plan Note (Signed)
-   We will continue PPI therapy 

## 2022-10-24 NOTE — ED Notes (Signed)
Assumed care of Pt., made sure Pt was clean and dry, repositioned PT, and flushed her IV. Pt is awake and alert.

## 2022-10-24 NOTE — ED Provider Notes (Signed)
Roderic Palau    CSN: [id number] Arrival date & time: 10/23/22  1554    HISTORY   Chief Complaint  Patient presents with   Nausea    Vomiting99 temp - Entered by patient   Emesis   HPI Jessica Erickson is a pleasant, 88 y.o. female who presents to urgent care today. Patient is here with her son states patient has had nausea and vomiting that began shortly after eating a [place] and drinking a couple coffee this morning.  States that patient was able to consume small cup of broth and some soda crackers but threw some of that up.  States that since then she has taken nothing by mouth but has continued to have nausea and retching without significant emesis.  Son states patient has not been vomiting blood.  Son states that patient had a bladder infection in 2018 that caused similar symptoms.  Son states that he has reached out to patient's primary care office but has been unable to find anyone to assist him with helping to evaluate her for possible UTI.  States her primary care is no longer with the practice and that she has not yet met with the provider to whom she has been assigned for primary care.     Emesis  Past Medical History:  Diagnosis Date   Acoustic neuroma ([place])    Allergy    Asthma    Bilateral swelling of feet    and legs   Bladder infection    CAD (coronary artery disease)    Cataract    Change in voice    Compression fracture of body of thoracic vertebra (HCC)    T12 09/18/15 MRI s/p fall    Constipation    COPD (chronic obstructive pulmonary disease) (HCC)    previous CXR with chronic interstitial lung dz    CVA (cerebral vascular accident) ([place])    Depression    Diabetes ([place])    with neuropathy   Diabetes mellitus, type 2 ([place])    Diarrhea    Double vision    DVT (deep venous thrombosis) ([place])    right leg 10/2015 was on coumadin off as of 2017/2018 ; s/p IVC filter   Enuresis    Eye pain, right    Fall    Fatty liver    09/15/15 also mildly  dilated pancreatitic duct rec MRCP small sub cm cyst hemangioma speeln mild right hydronephrorossi and prox. hydroureter, kidney stones, mild scarring kidneys   Female stress incontinence    Flank pain    GERD (gastroesophageal reflux disease)    with small hiatal hernia    Hard of hearing    Heart disease    History of kidney problems    Hyperlipidemia    mixed   Hypertension    Hypothyroidism, postsurgical    Impaired mobility and ADLs    uses rolling walker has caretaker 24/7 at home   Leg edema    Mixed incontinence urge and stress (female)(female)    Neuropathy    Osteoarthritis    DDD spine    Osteoporosis with fracture    T12 compression fracture   Photophobia    Pulmonary embolism ([place])    10/2015 off coumadin as of 04/2016   Pulmonary HTN (HCC)    mild pulm HTN, echo 10/09/15 EF 10-17%PZWCH 1 dd, RV systolic pressure increased    Recurrent UTI    Sinus pressure    Skin cancer    BCC jawline and  scalp    Thyroid disease    follows [facility]   TIA (transient ischemic attack)    MRI 2009/2010 neg stroke    Trigeminal neuralgia    Dr. Tomi Bamberger s/p gamma knife x 2, on Tegretol since 2011/2012 no increase in dose >200 mg bid rec per family per neurology    Urinary frequency    Urinary, incontinence, stress female    Dr Erlene Quan urology    Patient Active Problem List   Diagnosis Date Noted   Nausea and vomiting 10/24/2022   Dyslipidemia 10/24/2022   Hypothyroidism 10/24/2022   GERD without esophagitis 10/24/2022   Hearing loss 09/01/2022   DNR (do not resuscitate) 08/01/2022   Iron deficiency 07/26/2022   Abnormal gait 10/19/2021   At high risk for falls 10/14/2021   Spinal stenosis of lumbar region 09/30/2021   Pain in both lower extremities 09/30/2021   Osteoarthritis 09/30/2021   Lumbar and sacral arthritis 09/30/2021   Acute on chronic respiratory failure ([place]) 08/14/2021   Shortness of breath 08/13/2021   CKD (chronic kidney disease), stage III ([place])  08/13/2021   History of CVA (cerebrovascular accident) 08/13/2021   Prediabetes 06/29/2021   Pulmonary artery hypertension ([place]) 05/03/2021   Stage 4 chronic kidney disease ([place]) 03/29/2021   Grade I diastolic dysfunction 46/27/0350   Obesity (BMI 30-39.9) 08/16/2020   Chronic kidney disease, stage 3b ([place]) 04/06/2020   Thrombocytopenia ([place]) 04/06/2020   Seborrheic keratoses 04/06/2020   Hip pain 09/30/2019   History of deep vein thrombosis (DVT) of lower extremity 09/30/2019   Arthritis 09/30/2019   Fall 03/11/2019   Epistaxis 12/11/2018   History of DVT (deep vein thrombosis) 11/12/2018   Bilateral leg edema 06/04/2018   Hearing loss of both ears 06/04/2018   Actinic keratosis 06/04/2018   Bronchiectasis ([place]) 06/04/2018   Depression 05/17/2018   Dehydration 05/15/2018   Hypertension 05/03/2018   Lymphedema 05/02/2018   Iron deficiency anemia 05/02/2018   Physical deconditioning 05/02/2018   Drug interaction 05/02/2018   Supratherapeutic INR 05/02/2018   Presence of IVC filter 05/02/2018   Anemia 04/11/2018   Fatigue 04/11/2018   Moderate protein-calorie malnutrition ([place]) 03/22/2018   Orthostasis 03/05/2018   Dizziness 03/04/2018   COPD (chronic obstructive pulmonary disease) ([place]) 12/13/2017   Anxiety and depression 12/13/2017   Trigeminal neuralgia 12/13/2017   GERD (gastroesophageal reflux disease) 12/13/2017   Type 2 diabetes mellitus with stage 3b chronic kidney disease, without long-term current use of insulin ([place]) 12/13/2017   Constipation 12/13/2017   Insomnia 12/13/2017   Hypothyroid 12/13/2017   Acute lower UTI 10/08/2015   Low back pain 10/08/2015   Collapsed vertebra, not elsewhere classified, thoracic region, initial encounter for fracture ([place]) 09/20/2015   Basal cell carcinoma of scalp 04/06/2014   Fothergill's neuralgia 08/05/2013   Bladder infection, chronic 02/11/2013   Female genuine stress incontinence 02/11/2013   Incomplete bladder emptying  02/11/2013   Intrinsic sphincter deficiency 02/11/2013   Mixed incontinence 02/11/2013   Excessive urination at night 02/11/2013   Bladder retention 02/11/2013   FOM (frequency of micturition) 02/11/2013   Basal cell carcinoma of face 09/13/2011   Past Surgical History:  Procedure Laterality Date   APPENDECTOMY     as a child, open   BRAIN SURGERY     schwnnoma removal 1996    brain tumor surgery     BREAST SURGERY     breast bx   CATARACT EXTRACTION     CHOLECYSTECTOMY     EYE SURGERY  cataract   IVC FILTER PLACEMENT ([facility] HX)     Dr. Lucky Cowboy 10/2015    LAPAROSCOPIC TUBAL LIGATION     MOHS SURGERY     scalp 04/2014    PERIPHERAL VASCULAR CATHETERIZATION N/A 10/11/2015   Procedure: IVC Filter Insertion;  Surgeon: Algernon Huxley, MD;  Location: [facility];  Service: Cardiovascular;  Laterality: N/A;   PERIPHERAL VASCULAR THROMBECTOMY Bilateral 03/29/2018   Procedure: PERIPHERAL VASCULAR THROMBECTOMY;  Surgeon: Algernon Huxley, MD;  Location: [facility];  Service: Cardiovascular;  Laterality: Bilateral;   PUBOVAGINAL SLING     THROAT SURGERY     THYROID SURGERY     tumor around vocal cords    TOOTH EXTRACTION     winter 2018    TOTAL THYROIDECTOMY  1976   OB History   No obstetric history on file.    Home Medications    Prior to Admission medications   Medication Sig Start Date End Date Taking? Authorizing Provider  ondansetron (ZOFRAN-ODT) 4 MG disintegrating tablet Take 1 tablet (4 mg total) by mouth every 8 (eight) hours as needed for nausea or vomiting. Patient not taking: Reported on 10/24/2022 10/23/22  Yes Lynden Oxford Scales, PA-C  Accu-Chek Softclix Lancets lancets Use as instructed 10/11/22   Dutch Quint B, FNP  acetaminophen (TYLENOL) 500 MG tablet Take 2 tablets (1,000 mg total) by mouth 2 (two) times daily as needed. 09/29/21   McLean-Scocuzza, Nino Glow, MD  acetaminophen-codeine (TYLENOL #3) 300-30 MG tablet Take 1 tablet by mouth 2 (two)  times daily as needed for moderate pain. Patient not taking: Reported on 10/24/2022 08/04/21   Edwin Dada, MD  albuterol (PROVENTIL) (2.5 MG/3ML) 0.083% nebulizer solution Take 3 mLs (2.5 mg total) by nebulization in the morning, at noon, and at bedtime. 01/25/22   McLean-Scocuzza, Nino Glow, MD  apixaban (ELIQUIS) 2.5 MG TABS tablet Take 1 tablet (2.5 mg total) by mouth 2 (two) times daily. 01/25/22   McLean-Scocuzza, Nino Glow, MD  atorvastatin (LIPITOR) 40 MG tablet Take 1 tablet (40 mg total) by mouth daily at 6 PM. Generic ok 01/25/22   McLean-Scocuzza, Nino Glow, MD  BISACODYL PO Take by mouth. Patient not taking: Reported on 10/24/2022    [provider]  Blood Glucose Monitoring Suppl (ACCU-CHEK AVIVA PLUS) w/Device KIT Use as directed 09/22/21   McLean-Scocuzza, Nino Glow, MD  calcium-vitamin D (OSCAL WITH D) 500-5 MG-MCG tablet Take 1 tablet by mouth 2 (two) times daily. Unable to find former dose 600ca/400 vitamin D inform patient dose change [place] 08/23/22   McLean-Scocuzza, Nino Glow, MD  cetirizine (ZYRTEC) 10 MG tablet Take 1 tablet (10 mg total) by mouth daily. 01/25/22   McLean-Scocuzza, Nino Glow, MD  clotrimazole-betamethasone (LOTRISONE) lotion Apply 1 application topically 2 (two) times daily. Patient not taking: Reported on 10/24/2022 10/11/20   [provider]  Docusate Sodium (DOK PO) Take 1 tablet by mouth daily. Patient not taking: Reported on 10/24/2022    [provider]  feeding supplement, GLUCERNA SHAKE, (GLUCERNA SHAKE) LIQD Take 237 mLs by mouth 2 (two) times daily between meals. 08/04/21   Danford, Suann Larry, MD  Fluticasone-Umeclidin-Vilant (TRELEGY ELLIPTA) 100-62.5-25 MCG/ACT AEPB INHALE ONE PUFF DAILY. RINSE MOUTH AFTERUSE. STOP [place] 04/19/22   McLean-Scocuzza, Nino Glow, MD  furosemide (LASIX) 40 MG tablet TAKE ONE TABLET (40MG) DAILY. MAY TAKE ASECOND DOSE OF 40MG IF NEEDED AT LUNCH FOR 5 DAYS 08/02/22   McLean-Scocuzza, Nino Glow,  MD  gabapentin (NEURONTIN) 100 MG capsule TAKE 2 CAPSULES BY MOUTH 3 TIMES DAILY. 01/25/22   McLean-Scocuzza, Nino Glow, MD  glucose blood test strip Use to test blood sugar twice daily. 10/11/22   Kennyth Arnold, FNP  guaiFENesin (MUCINEX) 600 MG 12 hr tablet Take 2 tablets (1,200 mg total) by mouth 2 (two) times daily as needed for cough or to loosen phlegm. 05/05/21   Chesley Mires, MD  ipratropium-albuterol (DUONEB) 0.5-2.5 (3) MG/3ML SOLN Take 3 mLs by nebulization every 4 (four) hours as needed. 01/10/22   McLean-Scocuzza, Nino Glow, MD  levothyroxine (SYNTHROID) 175 MCG tablet Take 1 tablet (175 mcg total) by mouth daily before breakfast. D/c 200 mcg dose Do not take with other medications or vitamins 08/01/22   McLean-Scocuzza, Nino Glow, MD  Lidocaine-Menthol (ICY HOT MAX LIDOCAINE) 4-1 % CREA Apply 1 application. topically 3 (three) times daily with meals as needed. Dispense cream or spray pt preference 02/16/22   McLean-Scocuzza, Nino Glow, MD  Melatonin 10 MG TABS Take 10 mg by mouth at bedtime. 11/07/21   McLean-Scocuzza, Nino Glow, MD  mirtazapine (REMERON) 15 MG tablet Take 1 tablet (15 mg total) by mouth at bedtime. 01/25/22   McLean-Scocuzza, Nino Glow, MD  Multiple Vitamin (MULTIVITAMIN WITH MINERALS) TABS tablet Take 1 tablet by mouth daily.     [provider]  neomycin-polymyxin-hydrocortisone (CORTISPORIN) OTIC solution Place 4 drops into the right ear 4 (four) times daily. X4-7 days Patient not taking: Reported on 10/24/2022 02/16/22   McLean-Scocuzza, Nino Glow, MD  pantoprazole (PROTONIX) 40 MG tablet Take 1 tablet (40 mg total) by mouth daily. 30 minutes before lunch or dinner 01/25/22   McLean-Scocuzza, Nino Glow, MD  polyethylene glycol powder (GLYCOLAX/MIRALAX) 17 GM/SCOOP powder Take 17 g by mouth daily. 04/06/20   McLean-Scocuzza, Nino Glow, MD  polyvinyl alcohol (LIQUIFILM TEARS) 1.4 % ophthalmic solution Place 1 drop into both eyes at bedtime. 08/04/21   Danford, Suann Larry, MD    Family  History Family History  Problem Relation Age of Onset   Heart disease Mother    Diabetes Father    Cancer Daughter        breast ca x 2 s/p mastectomy    Social History Social History   Tobacco Use   Smoking status: Former    Packs/day: 0.50    Years: 20.00    Total pack years: 10.00    Types: Cigarettes    Quit date: 09/20/1995    Years since quitting: 27.1   Smokeless tobacco: Never   Tobacco comments:    quit 1996 smoked 20 years max 8 cig qd   Vaping Use   Vaping Use: Never used  Substance Use Topics   Alcohol use: No   Drug use: No   Allergies   Penicillins, Sulfa antibiotics, Amitiza [lubiprostone], Aspirin, and Penicillin g  Review of Systems Review of Systems  Gastrointestinal:  Positive for vomiting.   Pertinent findings revealed after performing a 14 point review of systems has been noted in the history of present illness.  Physical Exam Triage Vital Signs ED Triage Vitals  Enc Vitals Group     BP 09/30/21 0827 (!) 147/82     Pulse Rate 09/30/21 0827 72     Resp 09/30/21 0827 18     Temp 09/30/21 0827 98.3 F (36.8 C)     Temp Source 09/30/21 0827 Oral     SpO2 09/30/21 0827 98 %     Weight --  Height --      Head Circumference --      Peak Flow --      Pain Score 09/30/21 0826 5     Pain Loc --      Pain Edu? --      Excl. in [place]? --   No data found.  Updated Vital Signs BP 113/73   Pulse 71   Temp 97.9 F (36.6 C)   Resp 16   LMP  (LMP Unknown)   SpO2 93%   Physical Exam  Visual Acuity Right Eye Distance:   Left Eye Distance:   Bilateral Distance:    Right Eye Near:   Left Eye Near:    Bilateral Near:     UC Couse / Diagnostics / Procedures:     Radiology No results found.  Procedures Procedures (including critical care time) EKG  Pending results:  Labs Reviewed  POCT URINALYSIS DIP (MANUAL ENTRY) - Abnormal; Notable for the following components:      Result Value   Clarity, UA cloudy (*)    Blood, UA  trace-intact (*)    Leukocytes, UA Small (1+) (*)    All other components within normal limits  URINE CULTURE    Medications Ordered in UC: Medications  ondansetron (ZOFRAN) injection 4 mg (4 mg Intramuscular Given 10/23/22 1737)    UC Diagnoses / Final Clinical Impressions(s)   I have reviewed the triage vital signs and the nursing notes.  Pertinent labs & imaging results that were available during my care of the patient were reviewed by me and considered in my medical decision making (see chart for details).    Final diagnoses:  Acute cystitis with hematuria  Nausea and vomiting, unspecified vomiting type   Son also advised that we were unable to assist him with obtaining a urinary sample due to lack of staffing willing to provide two-person assist, son was able to help patient to the toilet with the use of a urine hat whereupon patient did provide a urine sample which revealed cloudy urine, trace blood and a small amount of leukocytes.  EMR was reviewed by me prior to patient's anticipated arrival.  Patient son was advised at the beginning of patient's visit that patient has a long and significant medical history for type 2 diabetes, CKD stage IV, COPD, DVT, GERD, urinary incontinence, urinary tract infections, acoustic neuroma, hypertension, history of CVA and TIA, postsurgical hypothyroidism along with many other conditions that may be contributing to her current nausea and vomiting.    I advised patient son that based on the results of her urinalysis I recommend that we treat her empirically with fosfomycin and await the results of urine culture, adjusting antibiotic treatment as needed based on the results of the culture.  Fosfomycin was sent to patient's pharmacy, per EMR this medication was tier 2 on her insurance formulary.  Patient was provided with an injection of Zofran during her visit to assist with nausea and increase the likelihood of her being able to take the fosfomycin  by mouth.  Zofran ODT was sent to her pharmacy for continued management of her nausea and vomiting, next dose due 8 hours after Zofran IM.  Son was further advised that if her symptoms do not resolve or significantly improve in the next 24 to 48 hours before she receives the results of her urine culture or if she continues to be unable to tolerate anything by mouth for more than 12 to 18 hours more, she should  go to the emergency room for IV rehydration and further evaluation of her nausea and vomiting.  Son verbalized understanding.   ED Prescriptions     Medication Sig Dispense Auth. Provider   fosfomycin (MONUROL) 3 g PACK Take 3 g by mouth once for 1 dose. 3 g Lynden Oxford Scales, PA-C   ondansetron (ZOFRAN-ODT) 4 MG disintegrating tablet Take 1 tablet (4 mg total) by mouth every 8 (eight) hours as needed for nausea or vomiting. Patient not taking:  Reported on 10/24/2022 20 tablet Lynden Oxford Scales, PA-C      PDMP not reviewed this encounter.  Pending results:  Labs Reviewed  POCT URINALYSIS DIP (MANUAL ENTRY) - Abnormal; Notable for the following components:      Result Value   Clarity, UA cloudy (*)    Blood, UA trace-intact (*)    Leukocytes, UA Small (1+) (*)    All other components within normal limits  URINE CULTURE    Discharge Instructions:   Discharge Instructions      Thank you for your assistance in helping your mother provide a urine sample today.  It does appear that she may have a urinary tract infection.  We will order a urine culture to confirm this.  In the meantime, recommend that she begins a an antibiotic called fosfomycin which is a single dose antibiotic that effectively treats the most common cause of urinary tract infections, E. coli.  I have sent a prescription to your pharmacy.  Once we receive the results of her urine culture, we will contact you for a phone to inform you of those results and provide you with any further recommendations or  prescriptions as needed.  I have also sent a prescription for Zofran to your pharmacy that you can give her every 8 hours as needed for nausea.  She also received a dose here in the clinic before you left today.  Thank you for bringing her to urgent care today.  We hope she feels better soon.    Disposition Upon Discharge:  Condition: stable for discharge home  Patient presented with an acute illness with associated systemic symptoms and significant discomfort requiring urgent management. In my opinion, this is a condition that a prudent lay person (someone who possesses an average knowledge of health and medicine) may potentially expect to result in complications if not addressed urgently such as respiratory distress, impairment of bodily function or dysfunction of bodily organs.   Routine symptom specific, illness specific and/or disease specific instructions were discussed with the patient and/or caregiver at length.   As such, the patient has been evaluated and assessed, work-up was performed and treatment was provided in alignment with urgent care protocols and evidence based medicine.  Patient/parent/caregiver has been advised that the patient may require follow up for further testing and treatment if the symptoms continue in spite of treatment, as clinically indicated and appropriate.  Patient/parent/caregiver has been advised to return to the [facility] or PCP if no better; to PCP or the Emergency Department if new signs and symptoms develop, or if the current signs or symptoms continue to change or worsen for further workup, evaluation and treatment as clinically indicated and appropriate  The patient will follow up with their current PCP if and as advised. If the patient does not currently have a PCP we will assist them in obtaining one.   The patient may need specialty follow up if the symptoms continue, in spite of conservative treatment and management, for further workup,  evaluation,  consultation and treatment as clinically indicated and appropriate.   Patient/parent/caregiver verbalized understanding and agreement of plan as discussed.  All questions were addressed during visit.  Please see discharge instructions below for further details of plan.  This office note has been dictated using Museum/gallery curator.  Unfortunately, this method of dictation can sometimes lead to typographical or grammatical errors.  I apologize for your inconvenience in advance if this occurs.  Please do not hesitate to reach out to me if clarification is needed.      Lynden Oxford Scales, PA-C 10/24/22 1145

## 2022-10-24 NOTE — H&P (Addendum)
[place]   PATIENT NAME: Jessica Erickson    MR#:  [id number]  DATE OF BIRTH:  1926-03-27  DATE OF ADMISSION:  10/23/2022  PRIMARY CARE PHYSICIAN: McLean-Scocuzza, Nino Glow, MD   Patient is coming from: Home  REQUESTING/REFERRING PHYSICIAN: Harvest Dark, MD  CHIEF COMPLAINT:   Chief Complaint  Patient presents with   Fatigue   Urinary Tract Infection    HISTORY OF PRESENT ILLNESS:  Jessica Erickson is a 88 y.o. Caucasian female with medical history significant for acoustic neuroma and deafness, asthma, CAD, COPD, CVA, depression, type 2 diabetes mellitus, and GERD, who presented to the ER with acute onset of generalized weakness and suspected urinary tract infection.  Per EMS the patient lives at home with her son who is an MD and was concerned about discoloration of her urine.  She has been having some dysuria.  She was seen in urgent care and diagnosed with UTI on 11/20 and was prescribed fosfomycin which she took for 1 dose.  She was noted to have diminished urine output and therefore she was brought to the ER.  No reported fever or chills.  No nausea or vomiting or abdominal pain.  No chest pain or palpitations.  No cough or wheezing or dyspnea reported.  ED Course: When she came to the ER, vital signs were within normal and later respiratory rate was 25.  Pulse oximetry was 96% on 2 L of O2 by nasal cannula.  Labs revealed a CO2 of 34 and glucose of 163, creatinine of 1.29 and calcium 8.5.  CBC was within normal.  Lactic acid was 1.2.  Influenza antigens and COVID-19 PCR came back negative.  Urinalysis showed small leukocytes and was cloudy and microscopic exam is pending.  Urine culture was sent. EKG as reviewed by me : With acute onset of EKG showed sinus rhythm with a rate of 70 with short PR interval and low voltage QRS Imaging: None.  The patient was given a gram of IV Rocephin, 4 mg of IV Zofran and 500 mill IV normal saline.  She will be admitted to the medical  observation bed for further evaluation and management. PAST MEDICAL HISTORY:   Past Medical History:  Diagnosis Date   Acoustic neuroma ([place])    Allergy    Asthma    Bilateral swelling of feet    and legs   Bladder infection    CAD (coronary artery disease)    Cataract    Change in voice    Compression fracture of body of thoracic vertebra (HCC)    T12 09/18/15 MRI s/p fall    Constipation    COPD (chronic obstructive pulmonary disease) (HCC)    previous CXR with chronic interstitial lung dz    CVA (cerebral vascular accident) ([place])    Depression    Diabetes ([place])    with neuropathy   Diabetes mellitus, type 2 ([place])    Diarrhea    Double vision    DVT (deep venous thrombosis) ([place])    right leg 10/2015 was on coumadin off as of 2017/2018 ; s/p IVC filter   Enuresis    Eye pain, right    Fall    Fatty liver    09/15/15 also mildly dilated pancreatitic duct rec MRCP small sub cm cyst hemangioma speeln mild right hydronephrorossi and prox. hydroureter, kidney stones, mild scarring kidneys   Female stress incontinence    Flank pain    GERD (gastroesophageal reflux disease)  with small hiatal hernia    Hard of hearing    Heart disease    History of kidney problems    Hyperlipidemia    mixed   Hypertension    Hypothyroidism, postsurgical    Impaired mobility and ADLs    uses rolling walker has caretaker 24/7 at home   Leg edema    Mixed incontinence urge and stress (female)(female)    Neuropathy    Osteoarthritis    DDD spine    Osteoporosis with fracture    T12 compression fracture   Photophobia    Pulmonary embolism ([place])    10/2015 off coumadin as of 04/2016   Pulmonary HTN (HCC)    mild pulm HTN, echo 10/09/15 EF 09-60%AVWUJ 1 dd, RV systolic pressure increased    Recurrent UTI    Sinus pressure    Skin cancer    BCC jawline and scalp    Thyroid disease    follows [facility]   TIA (transient ischemic attack)    MRI 2009/2010 neg stroke    Trigeminal  neuralgia    Dr. Tomi Bamberger s/p gamma knife x 2, on Tegretol since 2011/2012 no increase in dose >200 mg bid rec per family per neurology    Urinary frequency    Urinary, incontinence, stress female    Dr Erlene Quan urology     PAST SURGICAL HISTORY:   Past Surgical History:  Procedure Laterality Date   APPENDECTOMY     as a child, open   [place] removal 1996    brain tumor surgery     BREAST SURGERY     breast bx   CATARACT EXTRACTION     CHOLECYSTECTOMY     EYE SURGERY     cataract   IVC FILTER PLACEMENT ([place] HX)     Dr. Lucky Cowboy 10/2015    LAPAROSCOPIC TUBAL LIGATION     MOHS SURGERY     scalp 04/2014    PERIPHERAL VASCULAR CATHETERIZATION N/A 10/11/2015   Procedure: IVC Filter Insertion;  Surgeon: Algernon Huxley, MD;  Location: [facility];  Service: Cardiovascular;  Laterality: N/A;   PERIPHERAL VASCULAR THROMBECTOMY Bilateral 03/29/2018   Procedure: PERIPHERAL VASCULAR THROMBECTOMY;  Surgeon: Algernon Huxley, MD;  Location: [facility];  Service: Cardiovascular;  Laterality: Bilateral;   PUBOVAGINAL SLING     THROAT SURGERY     THYROID SURGERY     tumor around vocal cords    TOOTH EXTRACTION     winter 2018    TOTAL THYROIDECTOMY  1976    SOCIAL HISTORY:   Social History   Tobacco Use   Smoking status: Former    Packs/day: 0.50    Years: 20.00    Total pack years: 10.00    Types: Cigarettes    Quit date: 09/20/1995    Years since quitting: 27.1   Smokeless tobacco: Never   Tobacco comments:    quit 1996 smoked 20 years max 8 cig qd   Substance Use Topics   Alcohol use: No    FAMILY HISTORY:   Family History  Problem Relation Age of Onset   Heart disease Mother    Diabetes Father    Cancer Daughter        breast ca x 2 s/p mastectomy     DRUG ALLERGIES:   Allergies  Allergen Reactions   Penicillins Shortness Of Breath, Rash and Other (See Comments)    Has patient had a  PCN reaction causing immediate rash,  facial/tongue/throat swelling, SOB or lightheadedness with hypotension: Yes Has patient had a PCN reaction causing severe rash involving mucus membranes or skin necrosis: No Has patient had a PCN reaction that required hospitalization No Has patient had a PCN reaction occurring within the last 10 years: No If all of the above answers are "NO", then may proceed with Cephalosporin use.   Sulfa Antibiotics Shortness Of Breath, Rash and Other (See Comments)   Amitiza [Lubiprostone]     N/v/d   Aspirin Other (See Comments)    Reaction:  Unknown  Other reaction(s): Bleeding (intolerance) Per patient " causes nose to bleed" Can take 81 mg daily without any complications Other reaction(s): "bloody nose"    Penicillin G Other (See Comments)    Has patient had a PCN reaction causing immediate rash, facial/tongue/throat swelling, SOB or lightheadedness with hypotension: No Has patient had a PCN reaction causing severe rash involving mucus membranes or skin necrosis: Unknown Has patient had a PCN reaction that required hospitalization: Unknown Has patient had a PCN reaction occurring within the last 10 years: Unknown If all of the above answers are "NO", then may proceed with Cephalosporin use.    REVIEW OF SYSTEMS:   ROS As per history of present illness. All pertinent systems were reviewed above. Constitutional, HEENT, cardiovascular, respiratory, GI, GU, musculoskeletal, neuro, psychiatric, endocrine, integumentary and hematologic systems were reviewed and are otherwise negative/unremarkable except for positive findings mentioned above in the HPI.   MEDICATIONS AT HOME:   Prior to Admission medications   Medication Sig Start Date End Date Taking? Authorizing Provider  Accu-Chek Softclix Lancets lancets Use as instructed 10/11/22  Yes Dutch Quint B, FNP  apixaban (ELIQUIS) 2.5 MG TABS tablet Take 1 tablet (2.5 mg total) by mouth 2 (two) times daily. 01/25/22  Yes McLean-Scocuzza, Nino Glow, MD   atorvastatin (LIPITOR) 40 MG tablet Take 1 tablet (40 mg total) by mouth daily at 6 PM. Generic ok 01/25/22  Yes McLean-Scocuzza, Nino Glow, MD  Blood Glucose Monitoring Suppl (ACCU-CHEK AVIVA PLUS) w/Device KIT Use as directed 09/22/21  Yes McLean-Scocuzza, Nino Glow, MD  calcium-vitamin D (OSCAL WITH D) 500-5 MG-MCG tablet Take 1 tablet by mouth 2 (two) times daily. Unable to find former dose 600ca/400 vitamin D inform patient dose change Oscal 08/23/22  Yes McLean-Scocuzza, Nino Glow, MD  cetirizine (ZYRTEC) 10 MG tablet Take 1 tablet (10 mg total) by mouth daily. 01/25/22  Yes McLean-Scocuzza, Nino Glow, MD  feeding supplement, GLUCERNA SHAKE, (GLUCERNA SHAKE) LIQD Take 237 mLs by mouth 2 (two) times daily between meals. 08/04/21  Yes Danford, Suann Larry, MD  Fluticasone-Umeclidin-Vilant (TRELEGY ELLIPTA) 100-62.5-25 MCG/ACT AEPB INHALE ONE PUFF DAILY. RINSE MOUTH AFTERUSE. STOP SYMBICORT & SPIRIVA 04/19/22  Yes McLean-Scocuzza, Nino Glow, MD  furosemide (LASIX) 40 MG tablet TAKE ONE TABLET (40MG) DAILY. MAY TAKE ASECOND DOSE OF 40MG IF NEEDED AT LUNCH FOR 5 DAYS 08/02/22  Yes McLean-Scocuzza, Nino Glow, MD  gabapentin (NEURONTIN) 100 MG capsule TAKE 2 CAPSULES BY MOUTH 3 TIMES DAILY. 01/25/22  Yes McLean-Scocuzza, Nino Glow, MD  glucose blood test strip Use to test blood sugar twice daily. 10/11/22  Yes Kennyth Arnold, FNP  levothyroxine (SYNTHROID) 175 MCG tablet Take 1 tablet (175 mcg total) by mouth daily before breakfast. D/c 200 mcg dose Do not take with other medications or vitamins 08/01/22  Yes McLean-Scocuzza, Nino Glow, MD  Melatonin 10 MG TABS Take 10 mg by mouth at bedtime. 11/07/21  Yes McLean-Scocuzza,  Nino Glow, MD  mirtazapine (REMERON) 15 MG tablet Take 1 tablet (15 mg total) by mouth at bedtime. 01/25/22  Yes McLean-Scocuzza, Nino Glow, MD  Multiple Vitamin (MULTIVITAMIN WITH MINERALS) TABS tablet Take 1 tablet by mouth daily.    Yes [provider]  pantoprazole (PROTONIX) 40 MG tablet Take 1  tablet (40 mg total) by mouth daily. 30 minutes before lunch or dinner 01/25/22  Yes McLean-Scocuzza, Nino Glow, MD  polyethylene glycol powder (GLYCOLAX/MIRALAX) 17 GM/SCOOP powder Take 17 g by mouth daily. 04/06/20  Yes McLean-Scocuzza, Nino Glow, MD  polyvinyl alcohol (LIQUIFILM TEARS) 1.4 % ophthalmic solution Place 1 drop into both eyes at bedtime. 08/04/21  Yes Danford, Suann Larry, MD  acetaminophen (TYLENOL) 500 MG tablet Take 2 tablets (1,000 mg total) by mouth 2 (two) times daily as needed. 09/29/21   McLean-Scocuzza, Nino Glow, MD  acetaminophen-codeine (TYLENOL #3) 300-30 MG tablet Take 1 tablet by mouth 2 (two) times daily as needed for moderate pain. Patient not taking: Reported on 10/24/2022 08/04/21   Edwin Dada, MD  albuterol (PROVENTIL) (2.5 MG/3ML) 0.083% nebulizer solution Take 3 mLs (2.5 mg total) by nebulization in the morning, at noon, and at bedtime. 01/25/22   McLean-Scocuzza, Nino Glow, MD  BISACODYL PO Take by mouth. Patient not taking: Reported on 10/24/2022    [provider]  clotrimazole-betamethasone (LOTRISONE) lotion Apply 1 application topically 2 (two) times daily. Patient not taking: Reported on 10/24/2022 10/11/20   [provider]  Docusate Sodium (DOK PO) Take 1 tablet by mouth daily. Patient not taking: Reported on 10/24/2022    [provider]  guaiFENesin (MUCINEX) 600 MG 12 hr tablet Take 2 tablets (1,200 mg total) by mouth 2 (two) times daily as needed for cough or to loosen phlegm. 05/05/21   Chesley Mires, MD  ipratropium-albuterol (DUONEB) 0.5-2.5 (3) MG/3ML SOLN Take 3 mLs by nebulization every 4 (four) hours as needed. 01/10/22   McLean-Scocuzza, Nino Glow, MD  Lidocaine-Menthol (ICY HOT MAX LIDOCAINE) 4-1 % CREA Apply 1 application. topically 3 (three) times daily with meals as needed. Dispense cream or spray pt preference 02/16/22   McLean-Scocuzza, Nino Glow, MD  neomycin-polymyxin-hydrocortisone (CORTISPORIN) OTIC solution Place 4  drops into the right ear 4 (four) times daily. X4-7 days Patient not taking: Reported on 10/24/2022 02/16/22   McLean-Scocuzza, Nino Glow, MD  ondansetron (ZOFRAN-ODT) 4 MG disintegrating tablet Take 1 tablet (4 mg total) by mouth every 8 (eight) hours as needed for nausea or vomiting. Patient not taking: Reported on 10/24/2022 10/23/22   Lynden Oxford Scales, PA-C      VITAL SIGNS:  Blood pressure (!) 108/49, pulse 65, temperature 98.3 F (36.8 C), temperature source Oral, resp. rate 20, SpO2 100 %.  PHYSICAL EXAMINATION:  Physical Exam  GENERAL:  88 y.o.-year-old Caucasian female patient lying in the bed with no acute distress.  EYES: Pupils equal, round, reactive to light and accommodation. No scleral icterus. Extraocular muscles intact.  HEENT: Head atraumatic, normocephalic. Oropharynx and nasopharynx clear.  NECK:  Supple, no jugular venous distention. No thyroid enlargement, no tenderness.  LUNGS: Normal breath sounds bilaterally, no wheezing, rales,rhonchi or crepitation. No use of accessory muscles of respiration.  CARDIOVASCULAR: Regular rate and rhythm, S1, S2 normal. No murmurs, rubs, or gallops.  ABDOMEN: Soft, nondistended, nontender. Bowel sounds present. No organomegaly or mass.  EXTREMITIES: No pedal edema, cyanosis, or clubbing.  NEUROLOGIC: Cranial nerves II through XII are intact except for hearing loss. Muscle strength 5/5 in all extremities.  Sensation intact. Gait not checked.  PSYCHIATRIC: The patient is alert and oriented x 3.  Normal affect and good eye contact. SKIN: No obvious rash, lesion, or ulcer.   LABORATORY PANEL:   CBC Recent Labs  Lab 10/23/22 2314  WBC 8.8  HGB 13.3  HCT 41.9  PLT 157   ------------------------------------------------------------------------------------------------------------------  Chemistries  Recent Labs  Lab 10/23/22 2314  NA 144  K 4.2  CL 102  CO2 34*  GLUCOSE 163*  BUN 23  CREATININE 1.29*  CALCIUM 8.5*   AST 30  ALT 19  ALKPHOS 62  BILITOT 1.0   ------------------------------------------------------------------------------------------------------------------  Cardiac Enzymes No results for input(s): "TROPONINI" in the last 168 hours. ------------------------------------------------------------------------------------------------------------------  RADIOLOGY:  No results found.    IMPRESSION AND PLAN:  Assessment and Plan: * Acute lower UTI - The patient be admitted to a medical observation bed. - We will place on IV Rocephin, especially given her nausea and vomiting.. - We will follow urine culture and sensitivity.  Nausea and vomiting - The patient be hydrated with IV normal saline. - We will place on as needed antiemetics.  Hypothyroidism - We will continue Synthroid.  GERD without esophagitis - We will continue PPI therapy.  Dyslipidemia We will continue statin therapy.   DVT prophylaxis: Lovenox.  Advanced Care Planning:  Code Status: DNR/DNI. Family Communication:  The plan of care was discussed in details with the patient (and family). I answered all questions. The patient agreed to proceed with the above mentioned plan. Further management will depend upon hospital course. Disposition Plan: Back to previous home environment Consults called: none.  All the records are reviewed and case discussed with ED provider.  Status is: Observation  I certify that at the time of admission, it is my clinical judgment that the patient will require hospital care extending less than 2 midnights.                            Dispo: The patient is from: Home              Anticipated d/c is to: Home              Patient currently is not medically stable to d/c.              Difficult to place patient: No  Christel Mormon M.D on 10/24/2022 at 4:55 AM  [facility]   From 7 PM-7 AM, contact night-coverage [email or web address]  CC: Primary care physician; McLean-Scocuzza, Nino Glow, MD

## 2022-10-24 NOTE — Assessment & Plan Note (Signed)
-   The patient be hydrated with IV normal saline. - We will place on as needed antiemetics.

## 2022-10-24 NOTE — ED Notes (Signed)
Rounded on patient, son at bedside.   Updated on POC and bed status. Pt resting in gurney with even rise and fall of chest noted. VSS

## 2022-10-24 NOTE — Telephone Encounter (Signed)
Noted Pt admitted to hospital

## 2022-10-24 NOTE — Assessment & Plan Note (Signed)
-   We will continue statin therapy. 

## 2022-10-25 ENCOUNTER — Observation Stay: Payer: Medicare PPO

## 2022-10-25 DIAGNOSIS — Z86718 Personal history of other venous thrombosis and embolism: Secondary | ICD-10-CM | POA: Diagnosis not present

## 2022-10-25 DIAGNOSIS — R627 Adult failure to thrive: Secondary | ICD-10-CM | POA: Diagnosis present

## 2022-10-25 DIAGNOSIS — K219 Gastro-esophageal reflux disease without esophagitis: Secondary | ICD-10-CM | POA: Diagnosis present

## 2022-10-25 DIAGNOSIS — R531 Weakness: Secondary | ICD-10-CM | POA: Diagnosis present

## 2022-10-25 DIAGNOSIS — I129 Hypertensive chronic kidney disease with stage 1 through stage 4 chronic kidney disease, or unspecified chronic kidney disease: Secondary | ICD-10-CM | POA: Diagnosis present

## 2022-10-25 DIAGNOSIS — N390 Urinary tract infection, site not specified: Secondary | ICD-10-CM | POA: Diagnosis present

## 2022-10-25 DIAGNOSIS — F32A Depression, unspecified: Secondary | ICD-10-CM | POA: Diagnosis present

## 2022-10-25 DIAGNOSIS — J4489 Other specified chronic obstructive pulmonary disease: Secondary | ICD-10-CM | POA: Diagnosis present

## 2022-10-25 DIAGNOSIS — E8770 Fluid overload, unspecified: Secondary | ICD-10-CM | POA: Diagnosis not present

## 2022-10-25 DIAGNOSIS — H9190 Unspecified hearing loss, unspecified ear: Secondary | ICD-10-CM | POA: Diagnosis present

## 2022-10-25 DIAGNOSIS — Z66 Do not resuscitate: Secondary | ICD-10-CM | POA: Diagnosis present

## 2022-10-25 DIAGNOSIS — N1831 Chronic kidney disease, stage 3a: Secondary | ICD-10-CM | POA: Diagnosis present

## 2022-10-25 DIAGNOSIS — J9611 Chronic respiratory failure with hypoxia: Secondary | ICD-10-CM | POA: Diagnosis present

## 2022-10-25 DIAGNOSIS — E860 Dehydration: Secondary | ICD-10-CM | POA: Diagnosis present

## 2022-10-25 DIAGNOSIS — Z9981 Dependence on supplemental oxygen: Secondary | ICD-10-CM | POA: Diagnosis not present

## 2022-10-25 DIAGNOSIS — Z20822 Contact with and (suspected) exposure to covid-19: Secondary | ICD-10-CM | POA: Diagnosis present

## 2022-10-25 DIAGNOSIS — E1122 Type 2 diabetes mellitus with diabetic chronic kidney disease: Secondary | ICD-10-CM | POA: Diagnosis present

## 2022-10-25 DIAGNOSIS — Z8673 Personal history of transient ischemic attack (TIA), and cerebral infarction without residual deficits: Secondary | ICD-10-CM | POA: Diagnosis not present

## 2022-10-25 DIAGNOSIS — Z87891 Personal history of nicotine dependence: Secondary | ICD-10-CM | POA: Diagnosis not present

## 2022-10-25 DIAGNOSIS — Z8669 Personal history of other diseases of the nervous system and sense organs: Secondary | ICD-10-CM | POA: Diagnosis not present

## 2022-10-25 DIAGNOSIS — Z8249 Family history of ischemic heart disease and other diseases of the circulatory system: Secondary | ICD-10-CM | POA: Diagnosis not present

## 2022-10-25 DIAGNOSIS — E785 Hyperlipidemia, unspecified: Secondary | ICD-10-CM | POA: Diagnosis present

## 2022-10-25 DIAGNOSIS — Z833 Family history of diabetes mellitus: Secondary | ICD-10-CM | POA: Diagnosis not present

## 2022-10-25 DIAGNOSIS — I2510 Atherosclerotic heart disease of native coronary artery without angina pectoris: Secondary | ICD-10-CM | POA: Diagnosis present

## 2022-10-25 DIAGNOSIS — E890 Postprocedural hypothyroidism: Secondary | ICD-10-CM | POA: Diagnosis present

## 2022-10-25 LAB — URINE CULTURE: Culture: NO GROWTH

## 2022-10-25 MED ORDER — CEFDINIR 300 MG PO CAPS
300.0000 mg | ORAL_CAPSULE | Freq: Two times a day (BID) | ORAL | Status: DC
  Administered 2022-10-25 – 2022-10-27 (×4): 300 mg via ORAL
  Filled 2022-10-25 (×4): qty 1

## 2022-10-25 MED ORDER — FUROSEMIDE 10 MG/ML IJ SOLN
40.0000 mg | Freq: Two times a day (BID) | INTRAMUSCULAR | Status: AC
  Administered 2022-10-25 – 2022-10-26 (×3): 40 mg via INTRAVENOUS
  Filled 2022-10-25 (×3): qty 4

## 2022-10-25 MED ORDER — IPRATROPIUM-ALBUTEROL 0.5-2.5 (3) MG/3ML IN SOLN
3.0000 mL | Freq: Three times a day (TID) | RESPIRATORY_TRACT | Status: DC
  Administered 2022-10-25 – 2022-10-27 (×6): 3 mL via RESPIRATORY_TRACT
  Filled 2022-10-25 (×6): qty 3

## 2022-10-25 NOTE — Progress Notes (Signed)
Mobility Specialist - Progress Note    10/25/22 1500  Mobility  Activity Ambulated with assistance in room  Level of Assistance Contact guard assist, steadying assist  Assistive Device Front wheel walker  Distance Ambulated (ft) 25 ft  Activity Response Tolerated well  Mobility Referral Yes  $Mobility charge 1 Mobility   Pt awake in bed on 1.5L upon entry. Pt STS MinA and ambulates in room CGA. Pt given physical cue to correct gait pattern with RW. Pt returned to bed and left with needs in reach. Pt RN present.   Loma Sender Mobility Specialist 10/25/22, 4:00 PM

## 2022-10-25 NOTE — Evaluation (Signed)
Physical Therapy Evaluation Patient Details Name: Jessica Erickson MRN: [id number] DOB: 03-27-1926 Today's Date: 10/25/2022  History of Present Illness  88yo female post UTI. PMH includes acoustic neuroma and deafness, asthma, CAD, COPD, CVA, depression, type 2 diabetes mellitus, and GERD  Clinical Impression  Pt found supine in bed upon PT entry with c/o soiled bedding. PT opted for written communication given pt deafness. Pt bed mobility required supervision. Sit<>Stand min assist x1, RW, and 2 L O2 via [place]. Pt maintained static balance with bilateral UE support during clothing management. Stand pivot transfer to chair with CGA, RW, and 2L O2 via [place]. Pt pleasant throughout session. Pt would benefit from skilled physical therapy to address the listed deficits (see below) to increase independence with ADLs and function. Current recommendation is Home with HHPT and 24/7 assist. If 24/7 assist not available then SNF to return pt to PLOF and improve safety.          Recommendations for follow up therapy are one component of a multi-disciplinary discharge planning process, led by the attending physician.  Recommendations may be updated based on patient status, additional functional criteria and insurance authorization.  Follow Up Recommendations Home health PT      Assistance Recommended at Discharge Frequent or constant Supervision/Assistance  Patient can return home with the following  A lot of help with walking and/or transfers;A lot of help with bathing/dressing/bathroom;Assistance with cooking/housework;Assist for transportation;Help with stairs or ramp for entrance;Direct supervision/assist for medications management    Equipment Recommendations None recommended by PT  Recommendations for Other Services       Functional Status Assessment Patient has had a recent decline in their functional status and demonstrates the ability to make significant improvements in function in a reasonable and  predictable amount of time.     Precautions / Restrictions Precautions Precautions: Fall Restrictions Weight Bearing Restrictions: No      Mobility  Bed Mobility Overal bed mobility: Needs Assistance Bed Mobility: Supine to Sit     Supine to sit: Supervision          Transfers Overall transfer level: Needs assistance Equipment used: Rolling walker (2 wheels) Transfers: Sit to/from Stand, Bed to chair/wheelchair/BSC Sit to Stand: Min assist   Step pivot transfers: Min guard            Ambulation/Gait                  Stairs            Wheelchair Mobility    Modified Rankin (Stroke Patients Only)       Balance Overall balance assessment: Needs assistance Sitting-balance support: Bilateral upper extremity supported, Feet supported Sitting balance-Leahy Scale: Fair     Standing balance support: Reliant on assistive device for balance, Bilateral upper extremity supported Standing balance-Leahy Scale: Fair                               Pertinent Vitals/Pain Pain Assessment Pain Assessment: Faces Faces Pain Scale: No hurt    Home Living Family/patient expects to be discharged to:: Private residence Living Arrangements: Children Available Help at Discharge: Family;Available 24 hours/day Type of Home: House           Home Equipment: Rolling Walker (2 wheels) Additional Comments: O2 at baseline, and "breathing machine"    Prior Function Prior Level of Function : Needs assist       Physical Assist : Mobility (  physical);ADLs (physical) Mobility (physical): Transfers;Gait;Stairs ADLs (physical): Feeding;Grooming;Bathing;Dressing;Toileting;IADLs   ADLs Comments: son helps 24/7     Hand Dominance        Extremity/Trunk Assessment   Upper Extremity Assessment Upper Extremity Assessment: Generalized weakness    Lower Extremity Assessment Lower Extremity Assessment: Generalized weakness    Cervical / Trunk  Assessment Cervical / Trunk Assessment: Kyphotic  Communication   Communication: Deaf (prefers written communication)  Cognition Arousal/Alertness: Awake/alert Behavior During Therapy: WFL for tasks assessed/performed Overall Cognitive Status: Within Functional Limits for tasks assessed                                 General Comments: written communication        General Comments      Exercises     Assessment/Plan    PT Assessment Patient needs continued PT services  PT Problem List Decreased strength;Decreased balance;Decreased range of motion;Decreased mobility;Decreased activity tolerance       PT Treatment Interventions DME instruction;Functional mobility training;Balance training;Patient/family education;Gait training;Therapeutic activities;Neuromuscular re-education;Stair training;Therapeutic exercise    PT Goals (Current goals can be found in the Care Plan section)  Acute Rehab PT Goals Patient Stated Goal: to retur home PT Goal Formulation: With patient Time For Goal Achievement: 11/08/22 Potential to Achieve Goals: Good    Frequency Min 2X/week     Co-evaluation               AM-PAC PT "6 Clicks" Mobility  Outcome Measure Help needed turning from your back to your side while in a flat bed without using bedrails?: A Little Help needed moving from lying on your back to sitting on the side of a flat bed without using bedrails?: A Little Help needed moving to and from a bed to a chair (including a wheelchair)?: A Little Help needed standing up from a chair using your arms (e.g., wheelchair or bedside chair)?: A Little Help needed to walk in hospital room?: A Lot Help needed climbing 3-5 steps with a railing? : A Lot 6 Click Score: 16    End of Session Equipment Utilized During Treatment: Oxygen Activity Tolerance: Patient tolerated treatment well Patient left: in chair;with chair alarm set;with nursing/sitter in room Nurse  Communication: Mobility status PT Visit Diagnosis: Unsteadiness on feet (R26.81);Other abnormalities of gait and mobility (R26.89);Muscle weakness (generalized) (M62.81);Difficulty in walking, not elsewhere classified (R26.2)    Time: 9604-5409 PT Time Calculation (min) (ACUTE ONLY): 36 min   Charges:             Claiborne Billings O'Daniel, SPT  10/25/2022, 11:02 AM

## 2022-10-25 NOTE — TOC Progression Note (Signed)
Transition of Care [facility]) - Progression Note    Patient Details  Name: Jessica Erickson MRN: [id number] Date of Birth: July 09, 1926  Transition of Care [facility]) CM/SW [place], [place] Phone Number: 10/25/2022, 4:10 PM  Clinical Narrative:     This patient needs home health choice or outpatient PT choice.       Expected Discharge Plan and Services      HHPT vs outpatient PT                                           Social Determinants of Health (SDOH) Interventions    Readmission Risk Interventions    08/15/2021    9:47 AM  Readmission Risk Prevention Plan  Transportation Screening Complete  Medication Review ([place]) Complete  PCP or Specialist appointment within 3-5 days of discharge Complete  HRI or [place] Complete  SW Recovery Care/Counseling Consult Complete  Palliative Care Screening Complete  Skilled Nursing Facility Complete

## 2022-10-25 NOTE — Progress Notes (Addendum)
PROGRESS NOTE    Jessica Erickson  QMV:[id number] DOB: [birth year] DOA: 10/23/2022 PCP: McLean-Scocuzza, Nino Glow, MD     Brief Narrative:  H/o acoustic neuroma and deafness, COPD, dCHF, on home o2 2liters, chronic lower extremity edema, h/o RLE DVT , h/o IVC filter, on eliquis, from home baseline ambulatory sent from urgent care to the hospital due to n/v, FTT, concerning for uti and dehydration   Subjective:   She is deaf, communication through writing , prolonged encounter  Bilateral lower extremity pitting edema, on 2liter oxygen, audible wheezing Urine is clear in canister Will have PT to see her  Repeat labs in am Call son  Assessment & Plan:  Principal Problem:   Acute lower UTI Active Problems:   Nausea and vomiting   Hypothyroidism   GERD without esophagitis   Dyslipidemia    Assessment and Plan:    UTI/dehydration -She had urinary tract symptoms prior to admission, was given fosfomycin in urgent care, urine cultures are negative Urine is clear today, she received ivf at 100cc/hr and IV Rocephin x2 doses, changed to Keflex due to signs of volume overload    H/o chronic hypoxic respiratory failure on home O2 2 L History of COPD H/o cCHF h/o RLE DVT , h/o IVC filter, on eliquis, Signs of volume overload with bilateral lower extremity pitting edema, audible wheezing She is already on Eliquis, continue Eliquis, will get chest x-ray, change Lasix to IV Monitor volume status, follow-up on chest x-ray  Nausea and vomiting - Family report patient has nausea and vomiting when she gets UTI -Nausea and vomiting has resolved, tolerating diet  CKDIIIa, cr appears at baseline, monitor, renal dosing meds  Hypothyroidism - We will continue Synthroid.  GERD without esophagitis - We will continue PPI therapy.  Dyslipidemia We will continue statin therapy.    FTT: Baseline lives at home, ambulatory, currently reported being weak, will get physical therapy     Deafness: Communication through writing and talking to family over the phone     I have Reviewed nursing notes, Vitals, pain scores, I/o's, Lab results and  imaging results since pt's last encounter, details please see discussion above  I ordered the following labs:  Unresulted Labs (From admission, onward)    None        DVT prophylaxis: apixaban (ELIQUIS) tablet 2.5 mg Start: 10/24/22 1000 apixaban (ELIQUIS) tablet 2.5 mg   Code Status:   Code Status: DNR  Family Communication: Son and daughter over the phone Disposition:   Status is: Observation    Dispo: The patient is from: home              Anticipated d/c is to: PT recommended HH, f29fand HH order placed               Anticipated d/c date is: 24-48hrs pending respiratory status, lower extremity edema  Antimicrobials:   Anti-infectives (From admission, onward)    Start     Dose/Rate Route Frequency Ordered Stop   10/25/22 0700  cefTRIAXone (ROCEPHIN) 1 g in sodium chloride 0.9 % 100 mL IVPB        1 g 200 mL/hr over 30 Minutes Intravenous Every 24 hours 10/24/22 1421     10/24/22 0030  ceFEPIme (MAXIPIME) 1 g in sodium chloride 0.9 % 100 mL IVPB  Status:  Discontinued        1 g 200 mL/hr over 30 Minutes Intravenous  Once 10/24/22 0019 10/24/22 0024   10/24/22 0030  cefTRIAXone (ROCEPHIN) 1 g in sodium chloride 0.9 % 100 mL IVPB        1 g 200 mL/hr over 30 Minutes Intravenous  Once 10/24/22 0024 10/24/22 0127           Objective: Vitals:   10/24/22 2040 10/25/22 0136 10/25/22 0612 10/25/22 0813  BP: 91/74 (!) 142/51 (!) 144/52 (!) 139/52  Pulse: 60 60 62 60  Resp: '18 17 18 19  '$ Temp: 98.4 F (36.9 C) 97.6 F (36.4 C) 98.3 F (36.8 C) 99 F (37.2 C)  TempSrc: Oral  Oral Oral  SpO2: 99% 97% 98% 97%    Intake/Output Summary (Last 24 hours) at 10/25/2022 0935 Last data filed at 10/25/2022 0422 Gross per 24 hour  Intake 2052.06 ml  Output 300 ml  Net 1752.06 ml   There were no vitals filed  for this visit.  Examination:  General exam: alert, awake, communicative,calm, NAD Respiratory system: audible wheezing, Respiratory effort normal. Cardiovascular system:  RRR.  Gastrointestinal system: Abdomen is nondistended, soft and nontender.  Normal bowel sounds heard. Central nervous system: Alert and oriented. No focal neurological deficits. deafness Extremities:  bilateral lower e extremity pitting edema Skin: No rashes, lesions or ulcers Psychiatry: Judgement and insight appear normal. Mood & affect appropriate.     Data Reviewed: I have personally reviewed  labs and visualized  imaging studies since the last encounter and formulate the plan        Scheduled Meds:  apixaban  2.5 mg Oral BID   atorvastatin  40 mg Oral q1800   calcium-vitamin D  1 tablet Oral BID   feeding supplement (GLUCERNA SHAKE)  237 mL Oral BID BM   fluticasone furoate-vilanterol  1 puff Inhalation Daily   And   umeclidinium bromide  1 puff Inhalation Daily   furosemide  40 mg Intravenous Q12H   gabapentin  100 mg Oral TID   levothyroxine  175 mcg Oral Q0600   loratadine  10 mg Oral Daily   mirtazapine  15 mg Oral QHS   multivitamin with minerals  1 tablet Oral Daily   pantoprazole  40 mg Oral Daily   polyethylene glycol  17 g Oral Daily   polyvinyl alcohol  1 drop Both Eyes QHS   Continuous Infusions:  sodium chloride 100 mL/hr at 10/25/22 6269   cefTRIAXone (ROCEPHIN)  IV 1 g (10/25/22 0631)     LOS: 0 days     Florencia Reasons, MD PhD FACP [facility]  Available via Epic secure chat 7am-7pm for nonurgent issues Please page for urgent issues To page the attending provider between 7A-7P or the covering provider during after hours 7P-7A, please log into the web site [email or web address] and access using universal [place] password for that web site. If you do not have the password, please call the hospital operator.    10/25/2022, 9:35 AM

## 2022-10-26 DIAGNOSIS — N390 Urinary tract infection, site not specified: Secondary | ICD-10-CM | POA: Diagnosis not present

## 2022-10-26 LAB — BASIC METABOLIC PANEL
Anion gap: 5 (ref 5–15)
BUN: 18 mg/dL (ref 8–23)
CO2: 35 mmol/L — ABNORMAL HIGH (ref 22–32)
Calcium: 8.2 mg/dL — ABNORMAL LOW (ref 8.9–10.3)
Chloride: 102 mmol/L (ref 98–111)
Creatinine, Ser: 1.05 mg/dL — ABNORMAL HIGH (ref 0.44–1.00)
GFR, Estimated: 49 mL/min — ABNORMAL LOW (ref >60)
Glucose, Bld: 118 mg/dL — ABNORMAL HIGH (ref 70–99)
Potassium: 3.5 mmol/L (ref 3.5–5.1)
Sodium: 142 mmol/L (ref 135–145)

## 2022-10-26 LAB — MAGNESIUM: Magnesium: 1.9 mg/dL (ref 1.7–2.4)

## 2022-10-26 MED ORDER — LIDOCAINE 5 % EX PTCH
1.0000 | MEDICATED_PATCH | CUTANEOUS | Status: DC
  Administered 2022-10-26: 1 via TRANSDERMAL
  Filled 2022-10-26: qty 1

## 2022-10-26 MED ORDER — POTASSIUM CHLORIDE CRYS ER 20 MEQ PO TBCR
40.0000 meq | EXTENDED_RELEASE_TABLET | Freq: Once | ORAL | Status: AC
  Administered 2022-10-26: 40 meq via ORAL
  Filled 2022-10-26: qty 2

## 2022-10-26 MED ORDER — SENNOSIDES-DOCUSATE SODIUM 8.6-50 MG PO TABS
1.0000 | ORAL_TABLET | Freq: Every day | ORAL | Status: DC
  Administered 2022-10-26: 1 via ORAL
  Filled 2022-10-26: qty 1

## 2022-10-26 MED ORDER — GABAPENTIN 100 MG PO CAPS
200.0000 mg | ORAL_CAPSULE | Freq: Three times a day (TID) | ORAL | Status: DC
  Administered 2022-10-26 – 2022-10-27 (×3): 200 mg via ORAL
  Filled 2022-10-26 (×3): qty 2

## 2022-10-26 NOTE — Progress Notes (Signed)
PROGRESS NOTE    Jessica Erickson  EHU:[id number] DOB: 30-Dec-1925 DOA: 10/23/2022 PCP: McLean-Scocuzza, Nino Glow, MD     Brief Narrative:  H/o acoustic neuroma and deafness, COPD, dCHF, on home o2 2liters, chronic lower extremity edema, h/o RLE DVT , h/o IVC filter, on eliquis, from home baseline ambulatory sent from urgent care to the hospital due to n/v, FTT, concerning for uti and dehydration   Subjective:   She is deaf, communication through writing , prolonged encounter , son at bedside updated  Good response to iv lasix, Bilateral lower extremity pitting edema has improved, no audible wheezing today , appetite has improved,  Urine is clear in canister She c/o right lateral leg pain   Assessment & Plan:  Principal Problem:   Acute lower UTI Active Problems:   Nausea and vomiting   Hypothyroidism   GERD without esophagitis   Dyslipidemia   FTT (failure to thrive) in adult    Assessment and Plan:    UTI -She had urinary tract symptoms prior to admission, was given fosfomycin in urgent care, urine cultures are negative -Urine is clear today, she received  IV Rocephin x2 doses, changed to omnicef to finish treatment course    H/o chronic hypoxic respiratory failure on home O2 2 L History of COPD H/o dCHF h/o RLE DVT , h/o IVC filter, on eliquis, Signs of volume overload with bilateral lower extremity pitting edema, audible wheezing on 11/22, cxr showed pulmonary vascular congestion Appear responded to iv  Lasix , will continue today, reassess in am Monitor volume status  Nausea and vomiting - Family report patient has nausea and vomiting when she gets UTI -Nausea and vomiting has resolved, tolerating diet  CKDIIIa, cr appears at baseline, monitor, renal dosing meds  Hypothyroidism - continue Synthroid.  GERD without esophagitis -continue PPI therapy.  Dyslipidemia continue statin therapy.    FTT: Baseline lives at home, ambulatory, weaker  than  baseline, physical therapy  recommended home health  Deafness: Communication through writing     I have Reviewed nursing notes, Vitals, pain scores, I/o's, Lab results and  imaging results since pt's last encounter, details please see discussion above  I ordered the following labs:  Unresulted Labs (From admission, onward)     Start     Ordered   10/27/22 0500  CBC  Tomorrow morning,   R        10/26/22 1001   10/27/22 7858  Basic metabolic panel  Tomorrow morning,   R        10/26/22 1611   10/27/22 0500  Magnesium  Tomorrow morning,   R        10/26/22 1611             DVT prophylaxis: apixaban (ELIQUIS) tablet 2.5 mg Start: 10/24/22 1000 apixaban (ELIQUIS) tablet 2.5 mg   Code Status:   Code Status: DNR  Family Communication: Son at bedside  Disposition:   Dispo: The patient is from: home              Anticipated d/c is IF:OYDX with home health,  f82fand HH order placed               Anticipated d/c date is: likely home on 11/24, monitor lytes, cr,  respiratory status, lower extremity edema  Antimicrobials:   Anti-infectives (From admission, onward)    Start     Dose/Rate Route Frequency Ordered Stop   10/25/22 2200  cefdinir (OMNICEF) capsule 300 mg  300 mg Oral Every 12 hours 10/25/22 1010 10/28/22 2159   10/25/22 0700  cefTRIAXone (ROCEPHIN) 1 g in sodium chloride 0.9 % 100 mL IVPB  Status:  Discontinued        1 g 200 mL/hr over 30 Minutes Intravenous Every 24 hours 10/24/22 1421 10/25/22 1010   10/24/22 0030  ceFEPIme (MAXIPIME) 1 g in sodium chloride 0.9 % 100 mL IVPB  Status:  Discontinued        1 g 200 mL/hr over 30 Minutes Intravenous  Once 10/24/22 0019 10/24/22 0024   10/24/22 0030  cefTRIAXone (ROCEPHIN) 1 g in sodium chloride 0.9 % 100 mL IVPB        1 g 200 mL/hr over 30 Minutes Intravenous  Once 10/24/22 0024 10/24/22 0127           Objective: Vitals:   10/26/22 0811 10/26/22 0811 10/26/22 1416 10/26/22 1555  BP: (!) 126/51 (!)  126/51  (!) 119/52  Pulse: 60 62  69  Resp: '16 16  16  '$ Temp: 98.1 F (36.7 C) 98.1 F (36.7 C)  (!) 97.5 F (36.4 C)  TempSrc:      SpO2: 99% 98% 98% 99%  Height:        Intake/Output Summary (Last 24 hours) at 10/26/2022 1613 Last data filed at 10/26/2022 1448 Gross per 24 hour  Intake 220 ml  Output 2775 ml  Net -2555 ml   There were no vitals filed for this visit.  Examination:  General exam: frail elderly, very hard of hearing/ deafness, communication through Probation officer, appear in pain Respiratory system: mild wheezing on auscultation respiratory effort normal. Cardiovascular system:  RRR.  Gastrointestinal system: Abdomen is nondistended, soft and nontender.  Normal bowel sounds heard. Central nervous system: Alert and oriented. No focal neurological deficits. deafness Extremities:  bilateral lower extremity pitting edema has improved  Skin: No rashes, lesions or ulcers Psychiatry: Judgement and insight appear normal. Mood & affect appropriate.     Data Reviewed: I have personally reviewed  labs and visualized  imaging studies since the last encounter and formulate the plan        Scheduled Meds:  apixaban  2.5 mg Oral BID   atorvastatin  40 mg Oral q1800   calcium-vitamin D  1 tablet Oral BID   cefdinir  300 mg Oral Q12H   feeding supplement (GLUCERNA SHAKE)  237 mL Oral BID BM   fluticasone furoate-vilanterol  1 puff Inhalation Daily   And   umeclidinium bromide  1 puff Inhalation Daily   gabapentin  200 mg Oral TID   ipratropium-albuterol  3 mL Nebulization TID   levothyroxine  175 mcg Oral Q0600   lidocaine  1 patch Transdermal Q24H   loratadine  10 mg Oral Daily   mirtazapine  15 mg Oral QHS   multivitamin with minerals  1 tablet Oral Daily   pantoprazole  40 mg Oral Daily   polyethylene glycol  17 g Oral Daily   polyvinyl alcohol  1 drop Both Eyes QHS   senna-docusate  1 tablet Oral QHS   Continuous Infusions:     LOS: 1 day     Florencia Reasons, MD  PhD FACP [facility]  Available via Epic secure chat 7am-7pm for nonurgent issues Please page for urgent issues To page the attending provider between 7A-7P or the covering provider during after hours 7P-7A, please log into the web site [email or web address] and access using universal [place] password for that web site. If  you do not have the password, please call the hospital operator.    10/26/2022, 4:13 PM

## 2022-10-27 DIAGNOSIS — N390 Urinary tract infection, site not specified: Secondary | ICD-10-CM | POA: Diagnosis not present

## 2022-10-27 LAB — BASIC METABOLIC PANEL
Anion gap: 7 (ref 5–15)
BUN: 22 mg/dL (ref 8–23)
CO2: 35 mmol/L — ABNORMAL HIGH (ref 22–32)
Calcium: 8.6 mg/dL — ABNORMAL LOW (ref 8.9–10.3)
Chloride: 103 mmol/L (ref 98–111)
Creatinine, Ser: 1.1 mg/dL — ABNORMAL HIGH (ref 0.44–1.00)
GFR, Estimated: 47 mL/min — ABNORMAL LOW (ref >60)
Glucose, Bld: 130 mg/dL — ABNORMAL HIGH (ref 70–99)
Potassium: 3.8 mmol/L (ref 3.5–5.1)
Sodium: 145 mmol/L (ref 135–145)

## 2022-10-27 LAB — CBC
HCT: 36.7 % (ref 36.0–46.0)
Hemoglobin: 11.7 g/dL — ABNORMAL LOW (ref 12.0–15.0)
MCH: 30.2 pg (ref 26.0–34.0)
MCHC: 31.9 g/dL (ref 30.0–36.0)
MCV: 94.8 fL (ref 80.0–100.0)
Platelets: 135 10*3/uL — ABNORMAL LOW (ref 150–400)
RBC: 3.87 MIL/uL (ref 3.87–5.11)
RDW: 13.1 % (ref 11.5–15.5)
WBC: 4.8 10*3/uL (ref 4.0–10.5)
nRBC: 0 % (ref 0.0–0.2)

## 2022-10-27 LAB — MAGNESIUM: Magnesium: 2 mg/dL (ref 1.7–2.4)

## 2022-10-27 MED ORDER — CEFDINIR 300 MG PO CAPS: 300.0000 mg | ORAL_CAPSULE | Freq: Two times a day (BID) | ORAL | 0 refills | Status: AC

## 2022-10-27 MED ORDER — FUROSEMIDE 10 MG/ML IJ SOLN
40.0000 mg | Freq: Once | INTRAMUSCULAR | Status: AC
  Administered 2022-10-27: 40 mg via INTRAVENOUS
  Filled 2022-10-27: qty 4

## 2022-10-27 MED ORDER — SENNOSIDES-DOCUSATE SODIUM 8.6-50 MG PO TABS: 1.0000 | ORAL_TABLET | Freq: Every day | ORAL | 0 refills | Status: AC

## 2022-10-27 NOTE — Progress Notes (Signed)
Pt A/Ox4 upon review of AVS. Antibotic reveiwed with son and patient. Patietns caregivier driving patient home. No furhter needs. Assisted with getting dressed. PIV removed.

## 2022-10-27 NOTE — TOC Transition Note (Addendum)
Transition of Care [facility]) - CM/SW Discharge Note   Patient Details  Name: [name] MRN: [id number] Date of Birth: 03-17-1926  Transition of Care [facility]) CM/SW Contact:  Colen Darling, [place] Phone Number: 10/27/2022, 11:07 AM   Clinical Narrative:     TOC met with the patient at bedside and spoke with her caregiver in person. TOC also spoke to one of her adult children over the phone. TOC has arranged HHPT with [facility].  Patient will discharge to [place], [place].  Final next level of care: [place]     Patient Goals and CMS Choice Patient states their goals for this hospitalization and ongoing recovery are:: HHPT      Discharge Placement                 HHPT with [facility]      Discharge Plan and Services                      Time DME Agency Contacted: [phone]   [place]: PT [place]: [facility] Date [place]: 10/27/22 Time [place]: 44 Representative spoke with at [place]: Juanda [phone]  Social Determinants of Health (SDOH) Interventions     Readmission Risk Interventions    08/15/2021    9:47 AM  Readmission Risk Prevention Plan  Transportation Screening Complete  Medication Review (RN Care Manager) Complete  PCP or Specialist appointment within 3-5 days of discharge Complete  HRI or [place] Complete  SW Recovery Care/Counseling Consult Complete  Palliative Care Screening Complete  Skilled Nursing Facility Complete

## 2022-10-27 NOTE — Care Management Important Message (Signed)
Important Message  Patient Details  Name: LUREE PALLA MRN: [id number] Date of Birth: 14-Oct-1926   Medicare Important Message Given:  N/A - LOS <3 / Initial given by admissions     Dannette Barbara 10/27/2022, 8:23 AM

## 2022-10-27 NOTE — Progress Notes (Signed)
Physical Therapy Treatment Patient Details Name: Jessica Erickson MRN: [id number] DOB: July 09, 1926 Today's Date: 10/27/2022   History of Present Illness 88yo female post UTI. PMH includes acoustic neuroma and deafness, asthma, CAD, COPD, CVA, depression, type 2 diabetes mellitus, and GERD    PT Comments    Patient asleep on arrival but awakens to light touch and agreeable to PT tx session. Communicated via written communication on clipboard in room. Able to complete bed mobility with supervision + increased time. Initial standing required minA, however with repeated sit to stands, able to stand with min guard. Ambulated short distance in the room with min guard and RW, 3/4 DOE with VSS on 2L O2 [place]. Completed standing and seated exercises to promote BLE strengthening. D/c plan remains appropriate.     Recommendations for follow up therapy are one component of a multi-disciplinary discharge planning process, led by the attending physician.  Recommendations may be updated based on patient status, additional functional criteria and insurance authorization.  Follow Up Recommendations  Home health PT     Assistance Recommended at Discharge Frequent or constant Supervision/Assistance  Patient can return home with the following A lot of help with bathing/dressing/bathroom;Assistance with cooking/housework;Assist for transportation;Help with stairs or ramp for entrance;Direct supervision/assist for medications management;A little help with walking and/or transfers   Equipment Recommendations  None recommended by PT    Recommendations for Other Services       Precautions / Restrictions Precautions Precautions: Fall Restrictions Weight Bearing Restrictions: No     Mobility  Bed Mobility Overal bed mobility: Needs Assistance Bed Mobility: Supine to Sit     Supine to sit: Supervision     General bed mobility comments: increased time to complete    Transfers Overall transfer level:  Needs assistance Equipment used: Rolling Izadora Roehr (2 wheels) Transfers: Sit to/from Stand Sit to Stand: Min assist, Min guard           General transfer comment: initially minA for boost up into standing. On repeated sit to stand, able to complete min guard    Ambulation/Gait Ambulation/Gait assistance: Min guard Gait Distance (Feet): 25 Feet Assistive device: Rolling Brode Sculley (2 wheels) Gait Pattern/deviations: Step-through pattern, Decreased stride length, Trunk flexed Gait velocity: decreased     General Gait Details: min guard for safety. Trunk flexed and 3/4 DOE. VSS on 2L O2   Stairs             Wheelchair Mobility    Modified Rankin (Stroke Patients Only)       Balance Overall balance assessment: Needs assistance Sitting-balance support: Bilateral upper extremity supported, Feet supported Sitting balance-Leahy Scale: Fair     Standing balance support: Reliant on assistive device for balance, Bilateral upper extremity supported Standing balance-Leahy Scale: Fair                              Cognition Arousal/Alertness: Awake/alert Behavior During Therapy: WFL for tasks assessed/performed Overall Cognitive Status: Within Functional Limits for tasks assessed                                 General Comments: writtent communication        Exercises General Exercises - Lower Extremity Ankle Circles/Pumps: Both, 10 reps, Seated Long Arc Quad: Both, 10 reps, Seated Hip Flexion/Marching: Both, 10 reps, Standing Other Exercises Other Exercises: sit to stand x 5  General Comments        Pertinent Vitals/Pain Pain Assessment Pain Assessment: Faces Faces Pain Scale: No hurt Pain Intervention(s): Monitored during session    Home Living                          Prior Function            PT Goals (current goals can now be found in the care plan section) Acute Rehab PT Goals PT Goal Formulation: With  patient Time For Goal Achievement: 11/08/22 Potential to Achieve Goals: Good Progress towards PT goals: Progressing toward goals    Frequency    Min 2X/week      PT Plan Current plan remains appropriate    Co-evaluation              AM-PAC PT "6 Clicks" Mobility   Outcome Measure  Help needed turning from your back to your side while in a flat bed without using bedrails?: A Little Help needed moving from lying on your back to sitting on the side of a flat bed without using bedrails?: A Little Help needed moving to and from a bed to a chair (including a wheelchair)?: A Little Help needed standing up from a chair using your arms (e.g., wheelchair or bedside chair)?: A Little Help needed to walk in hospital room?: A Little Help needed climbing 3-5 steps with a railing? : A Lot 6 Click Score: 17    End of Session Equipment Utilized During Treatment: Oxygen Activity Tolerance: Patient tolerated treatment well Patient left: in bed;with call bell/phone within reach;with nursing/sitter in room (seated EOB with RN approval) Nurse Communication: Mobility status PT Visit Diagnosis: Unsteadiness on feet (R26.81);Other abnormalities of gait and mobility (R26.89);Muscle weakness (generalized) (M62.81);Difficulty in walking, not elsewhere classified (R26.2)     Time: 2703-5009 PT Time Calculation (min) (ACUTE ONLY): 26 min  Charges:  $Therapeutic Exercise: 8-22 mins $Therapeutic Activity: 8-22 mins                     Leiann Sporer A. Gilford Rile PT, DPT [facility] - Acute Rehabilitation Services    Blaine Hari A Taylan Marez 10/27/2022, 9:25 AM

## 2022-10-27 NOTE — TOC Initial Note (Signed)
Transition of Care [facility]) - Initial/Assessment Note    Patient Details  Name: Jessica Erickson MRN: [id number] Date of Birth: 1926/01/29  Transition of Care [facility]) CM/SW Contact:    Colen Darling, [place] Phone Number: 10/27/2022, 11:01 AM  Clinical Narrative:                  TOC met with the patient at bedside.  This patient has a PCP named Olivia Mackie McClean-Scocuzza listed in the chart. Caregiver also states that the patient sees Dr. Carollee Leitz at [facility]. The patient will discharge to [place], [place].  This patient has transportation provided by her son and caregiver. Her pharmacy is [facility]. She previously received HHRN and HHPT.  TOC reached out to [facility] and they can accept for HHPT.  Expected Discharge Plan: [place]     Patient Goals and CMS Choice Patient states their goals for this hospitalization and ongoing recovery are:: start HHPT      Expected Discharge Plan and Services Expected Discharge Plan: [place]         Expected Discharge Date: 10/27/22                         HH Arranged: PT HH Agency: [facility] Date [place] Agency Contacted: 10/27/22 Time [place] Agency Contacted: 1059 Representative spoke with at [place]: Juanda [phone]  Prior Living Arrangements/Services   Lives with:: Adult Children Patient language and need for interpreter reviewed:: No Do you feel safe going back to the place where you live?: Yes      Need for Family Participation in Patient Care: Yes (Comment) Care giver support system in place?: Yes (comment)   Criminal Activity/Legal Involvement Pertinent to Current Situation/Hospitalization: No - Comment as needed  Activities of Daily Living Home Assistive Devices/Equipment: Eyeglasses ADL Screening (condition at time of admission) Patient's cognitive ability adequate to safely complete daily activities?:  No Is the patient deaf or have difficulty hearing?: Yes Does the patient have difficulty seeing, even when wearing glasses/contacts?: No Does the patient have difficulty concentrating, remembering, or making decisions?: Yes Patient able to express need for assistance with ADLs?: No Does the patient have difficulty dressing or bathing?: Yes Independently performs ADLs?: No Communication: Independent Dressing (OT): Needs assistance Is this a change from baseline?: Pre-admission baseline Grooming: Needs assistance Is this a change from baseline?: Pre-admission baseline Feeding: Independent Bathing: Needs assistance Is this a change from baseline?: Pre-admission baseline Toileting: Needs assistance Is this a change from baseline?: Pre-admission baseline In/Out Bed: Needs assistance Is this a change from baseline?: Pre-admission baseline Walks in Home: Needs assistance Is this a change from baseline?: Pre-admission baseline Does the patient have difficulty walking or climbing stairs?: Yes Weakness of Legs: Both Weakness of Arms/Hands: Both  Permission Sought/Granted Permission sought to share information with : Family Supports                Emotional Assessment Appearance:: Appears stated age     Orientation: : Oriented to Self, Oriented to Place, Oriented to  Time, Oriented to Situation      Admission diagnosis:  UTI (urinary tract infection) [N39.0] FTT (failure to thrive) in adult [R62.7] Patient Active Problem List   Diagnosis Date Noted   FTT (failure to thrive) in adult 10/25/2022   Nausea and vomiting 10/24/2022   Dyslipidemia 10/24/2022   Hypothyroidism  10/24/2022   GERD without esophagitis 10/24/2022   Hearing loss 09/01/2022   DNR (do not resuscitate) 08/01/2022   Iron deficiency 07/26/2022   Abnormal gait 10/19/2021   At high risk for falls 10/14/2021   Spinal stenosis of lumbar region 09/30/2021   Pain in both lower extremities 09/30/2021    Osteoarthritis 09/30/2021   Lumbar and sacral arthritis 09/30/2021   Acute on chronic respiratory failure ([place]) 08/14/2021   Shortness of breath 08/13/2021   CKD (chronic kidney disease), stage III ([place]) 08/13/2021   History of CVA (cerebrovascular accident) 08/13/2021   Prediabetes 06/29/2021   Pulmonary artery hypertension ([place]) 05/03/2021   Stage 4 chronic kidney disease ([place]) 03/29/2021   Grade I diastolic dysfunction 02/63/7858   Obesity (BMI 30-39.9) 08/16/2020   Chronic kidney disease, stage 3b ([place]) 04/06/2020   Thrombocytopenia ([place]) 04/06/2020   Seborrheic keratoses 04/06/2020   Hip pain 09/30/2019   History of deep vein thrombosis (DVT) of lower extremity 09/30/2019   Arthritis 09/30/2019   Fall 03/11/2019   Epistaxis 12/11/2018   History of DVT (deep vein thrombosis) 11/12/2018   Bilateral leg edema 06/04/2018   Hearing loss of both ears 06/04/2018   Actinic keratosis 06/04/2018   Bronchiectasis ([place]) 06/04/2018   Depression 05/17/2018   Dehydration 05/15/2018   Hypertension 05/03/2018   Lymphedema 05/02/2018   Iron deficiency anemia 05/02/2018   Physical deconditioning 05/02/2018   Drug interaction 05/02/2018   Supratherapeutic INR 05/02/2018   Presence of IVC filter 05/02/2018   Anemia 04/11/2018   Fatigue 04/11/2018   Moderate protein-calorie malnutrition ([place]) 03/22/2018   Orthostasis 03/05/2018   Dizziness 03/04/2018   COPD (chronic obstructive pulmonary disease) ([place]) 12/13/2017   Anxiety and depression 12/13/2017   Trigeminal neuralgia 12/13/2017   GERD (gastroesophageal reflux disease) 12/13/2017   Type 2 diabetes mellitus with stage 3b chronic kidney disease, without long-term current use of insulin ([place]) 12/13/2017   Constipation 12/13/2017   Insomnia 12/13/2017   Hypothyroid 12/13/2017   Acute lower UTI 10/08/2015   Low back pain 10/08/2015   Collapsed vertebra, not elsewhere classified, thoracic region, initial encounter for fracture ([place])  09/20/2015   Basal cell carcinoma of scalp 04/06/2014   Fothergill's neuralgia 08/05/2013   Bladder infection, chronic 02/11/2013   Female genuine stress incontinence 02/11/2013   Incomplete bladder emptying 02/11/2013   Intrinsic sphincter deficiency 02/11/2013   Mixed incontinence 02/11/2013   Excessive urination at night 02/11/2013   Bladder retention 02/11/2013   FOM (frequency of micturition) 02/11/2013   Basal cell carcinoma of face 09/13/2011   PCP:  McLean-Scocuzza, Nino Glow, MD Pharmacy:   [place], [place] - [address] [place] [address] Phone: [phone] Fax: [phone]  [facility] ([facility]) - [place], [place] - [address] [place] [address] Phone: [phone] Fax: [phone]     Social Determinants of Health (SDOH) Interventions    Readmission Risk Interventions    08/15/2021    9:47 AM  Readmission Risk Prevention Plan  Transportation Screening Complete  Medication Review ([place]) Complete  PCP or Specialist appointment within 3-5 days of discharge Complete  HRI or Home Care Consult Complete  SW Recovery Care/Counseling Consult Complete  Palliative Care Screening Complete  Skilled Nursing Facility Complete

## 2022-10-27 NOTE — Discharge Summary (Signed)
Discharge Summary  Jessica Erickson ZOX:[id number] DOB: Sep 04, 1926  PCP: McLean-Scocuzza, Nino Glow, MD  Admit date: 10/23/2022 Discharge date: 10/27/2022  30 Day Unplanned Readmission Risk Score    Flowsheet Row ED to Hosp-Admission (Current) from 10/23/2022 in [place]  30 Day Unplanned Readmission Risk Score (%) 22.29 Filed at 10/27/2022 0801       This score is the patient's risk of an unplanned readmission within 30 days of being discharged (0 -100%). The score is based on dignosis, age, lab data, medications, orders, and past utilization.   Low:  0-14.9   Medium: 15-21.9   High: 22-29.9   Extreme: 30 and above         Time spent: 38mns, more than 50% time spent on coordination of care.   Recommendations for Outpatient Follow-up:  F/u with PCP within a week  for hospital discharge follow up, repeat cbc/bmp at follow up    Discharge Diagnoses:  Active Hospital Problems   Diagnosis Date Noted   Acute lower UTI 10/08/2015   Nausea and vomiting 10/24/2022    Priority: 2.   Hypothyroidism 10/24/2022    Priority: 3.   GERD without esophagitis 10/24/2022    Priority: 4.   FTT (failure to thrive) in adult 10/25/2022   Dyslipidemia 10/24/2022    Resolved Hospital Problems  No resolved problems to display.    Discharge Condition: stable  Diet recommendation: heart healthy/carb modified  Filed Weights   10/27/22 0112  Weight: 88.8 kg    History of present illness: ( per admitting MD Dr MSidney Ace LDierdre Forthis a 88y.o. Caucasian female with medical history significant for acoustic neuroma and deafness, asthma, CAD, COPD, CVA, depression, type 2 diabetes mellitus, and GERD, who presented to the ER with acute onset of generalized weakness and suspected urinary tract infection.  Per EMS the patient lives at home with her son who is an MD and was concerned about discoloration of her urine.  She has been having some dysuria.   She was seen in urgent care and diagnosed with UTI on 11/20 and was prescribed fosfomycin which she took for 1 dose.  She was noted to have diminished urine output and therefore she was brought to the ER.  No reported fever or chills.  No nausea or vomiting or abdominal pain.  No chest pain or palpitations.  No cough or wheezing or dyspnea reported.   ED Course: When she came to the ER, vital signs were within normal and later respiratory rate was 25.  Pulse oximetry was 96% on 2 L of O2 by nasal cannula.  Labs revealed a CO2 of 34 and glucose of 163, creatinine of 1.29 and calcium 8.5.  CBC was within normal.  Lactic acid was 1.2.  Influenza antigens and COVID-19 PCR came back negative.  Urinalysis showed small leukocytes and was cloudy and microscopic exam is pending.  Urine culture was sent. EKG as reviewed by me : With acute onset of EKG showed sinus rhythm with a rate of 70 with short PR interval and low voltage QRS Imaging: None.   The patient was given a gram of IV Rocephin, 4 mg of IV Zofran and 500 mill IV normal saline.  She will be admitted to the medical observation bed for further evaluation and management.  Hospital Course:  Principal Problem:   Acute lower UTI Active Problems:   Nausea and vomiting   Hypothyroidism   GERD without esophagitis  Dyslipidemia   FTT (failure to thrive) in adult   Assessment and Plan:  UTI -She had urinary tract symptoms prior to admission, was given fosfomycin in urgent care, urine cultures are negative -Urine now is clear , she received  IV Rocephin x2 doses, changed to omnicef to finish treatment course       H/o chronic hypoxic respiratory failure on home O2 2 L History of COPD H/o dCHF h/o RLE DVT , h/o IVC filter, on eliquis, Signs of volume overload with bilateral lower extremity pitting edema, audible wheezing on 11/22, cxr showed pulmonary vascular congestion  responded to iv  Lasix , bilateral lower extremity edema has greatly  improved, lung exam with good aeration no wheezing, transition back to home dose lasix at discharge    Nausea and vomiting - Family report patient has nausea and vomiting when she gets UTI -Nausea and vomiting has resolved, tolerating diet   CKDIIIa, cr appears at baseline, monitor, renal dosing meds   Hypothyroidism - continue Synthroid.   GERD without esophagitis -continue PPI therapy.   Dyslipidemia continue statin therapy.       FTT: Baseline lives at home, ambulatory, weaker  than baseline, physical therapy  recommended home health, order placed    Deafness: Communication through writing  Discharge Exam: BP (!) 108/40 (BP Location: Right Arm)   Pulse (!) 59   Temp 97.7 F (36.5 C)   Resp 18   Ht _0  (1.702 m)   Wt 88.8 kg   LMP  (LMP Unknown)   SpO2 97%   BMI 30.66 kg/m   General: frail elderly, NAD, hard of hearing , aaox3 Cardiovascular: RRR Respiratory: normal respiratory effort     Discharge Instructions     Diet - low sodium heart healthy   Complete by: As directed    Carb modified diet   Increase activity slowly   Complete by: As directed       Allergies as of 10/27/2022       Reactions   Penicillins Shortness Of Breath, Rash, Other (See Comments)   Has patient had a PCN reaction causing immediate rash, facial/tongue/throat swelling, SOB or lightheadedness with hypotension: Yes Has patient had a PCN reaction causing severe rash involving mucus membranes or skin necrosis: No Has patient had a PCN reaction that required hospitalization No Has patient had a PCN reaction occurring within the last 10 years: No If all of the above answers are "NO", then may proceed with Cephalosporin use.   Sulfa Antibiotics Shortness Of Breath, Rash, Other (See Comments)   Amitiza [lubiprostone]    N/v/d   Aspirin Other (See Comments)   Reaction:  Unknown  Other reaction(s): Bleeding (intolerance) Per patient " causes nose to bleed" Can take 81 mg daily  without any complications Other reaction(s): "bloody nose"   Penicillin G Other (See Comments)   Has patient had a PCN reaction causing immediate rash, facial/tongue/throat swelling, SOB or lightheadedness with hypotension: No Has patient had a PCN reaction causing severe rash involving mucus membranes or skin necrosis: Unknown Has patient had a PCN reaction that required hospitalization: Unknown Has patient had a PCN reaction occurring within the last 10 years: Unknown If all of the above answers are "NO", then may proceed with Cephalosporin use.        Medication List     STOP taking these medications    acetaminophen-codeine 300-30 MG tablet Commonly known as: TYLENOL #3   BISACODYL PO   clotrimazole-betamethasone lotion Commonly known  as: LOTRISONE   DOK PO   fosfomycin 3 g Pack Commonly known as: MONUROL   neomycin-polymyxin-hydrocortisone OTIC solution Commonly known as: CORTISPORIN   ondansetron 4 MG disintegrating tablet Commonly known as: ZOFRAN-ODT       TAKE these medications    Accu-Chek Aviva Plus w/Device Kit Use as directed   Accu-Chek Softclix Lancets lancets Use as instructed   acetaminophen 500 MG tablet Commonly known as: TYLENOL Take 2 tablets (1,000 mg total) by mouth 2 (two) times daily as needed.   albuterol (2.5 MG/3ML) 0.083% nebulizer solution Commonly known as: PROVENTIL Take 3 mLs (2.5 mg total) by nebulization in the morning, at noon, and at bedtime.   apixaban 2.5 MG Tabs tablet Commonly known as: Eliquis Take 1 tablet (2.5 mg total) by mouth 2 (two) times daily.   atorvastatin 40 MG tablet Commonly known as: LIPITOR Take 1 tablet (40 mg total) by mouth daily at 6 PM. Generic ok   calcium-vitamin D 500-5 MG-MCG tablet Commonly known as: OSCAL WITH D Take 1 tablet by mouth 2 (two) times daily. Unable to find former dose 600ca/400 vitamin D inform patient dose change Oscal   cefdinir 300 MG capsule Commonly known as:  OMNICEF Take 1 capsule (300 mg total) by mouth every 12 (twelve) hours for 2 days.   cetirizine 10 MG tablet Commonly known as: ZYRTEC Take 1 tablet (10 mg total) by mouth daily.   feeding supplement (GLUCERNA SHAKE) Liqd Take 237 mLs by mouth 2 (two) times daily between meals.   furosemide 40 MG tablet Commonly known as: LASIX TAKE ONE TABLET (40MG) DAILY. MAY TAKE ASECOND DOSE OF 40MG IF NEEDED AT LUNCH FOR 5 DAYS   gabapentin 100 MG capsule Commonly known as: NEURONTIN TAKE 2 CAPSULES BY MOUTH 3 TIMES DAILY.   glucose blood test strip Use to test blood sugar twice daily.   guaiFENesin 600 MG 12 hr tablet Commonly known as: MUCINEX Take 2 tablets (1,200 mg total) by mouth 2 (two) times daily as needed for cough or to loosen phlegm.   Icy Hot Max Lidocaine 4-1 % Crea Generic drug: Lidocaine-Menthol Apply 1 application. topically 3 (three) times daily with meals as needed. Dispense cream or spray pt preference   ipratropium-albuterol 0.5-2.5 (3) MG/3ML Soln Commonly known as: DUONEB Take 3 mLs by nebulization every 4 (four) hours as needed.   levothyroxine 175 MCG tablet Commonly known as: SYNTHROID Take 1 tablet (175 mcg total) by mouth daily before breakfast. D/c 200 mcg dose Do not take with other medications or vitamins   Melatonin 10 MG Tabs Take 10 mg by mouth at bedtime.   mirtazapine 15 MG tablet Commonly known as: Remeron Take 1 tablet (15 mg total) by mouth at bedtime.   multivitamin with minerals Tabs tablet Take 1 tablet by mouth daily.   pantoprazole 40 MG tablet Commonly known as: PROTONIX Take 1 tablet (40 mg total) by mouth daily. 30 minutes before lunch or dinner   polyethylene glycol powder 17 GM/SCOOP powder Commonly known as: GLYCOLAX/MIRALAX Take 17 g by mouth daily.   polyvinyl alcohol 1.4 % ophthalmic solution Commonly known as: LIQUIFILM TEARS Place 1 drop into both eyes at bedtime.   senna-docusate 8.6-50 MG tablet Commonly known  as: Senokot-S Take 1 tablet by mouth at bedtime for 7 days.   Trelegy Ellipta 100-62.5-25 MCG/ACT Aepb Generic drug: Fluticasone-Umeclidin-Vilant INHALE ONE PUFF DAILY. RINSE MOUTH AFTERUSE. STOP SYMBICORT & SPIRIVA       Allergies  Allergen Reactions  Penicillins Shortness Of Breath, Rash and Other (See Comments)    Has patient had a PCN reaction causing immediate rash, facial/tongue/throat swelling, SOB or lightheadedness with hypotension: Yes Has patient had a PCN reaction causing severe rash involving mucus membranes or skin necrosis: No Has patient had a PCN reaction that required hospitalization No Has patient had a PCN reaction occurring within the last 10 years: No If all of the above answers are "NO", then may proceed with Cephalosporin use.   Sulfa Antibiotics Shortness Of Breath, Rash and Other (See Comments)   Amitiza [Lubiprostone]     N/v/d   Aspirin Other (See Comments)    Reaction:  Unknown  Other reaction(s): Bleeding (intolerance) Per patient " causes nose to bleed" Can take 81 mg daily without any complications Other reaction(s): "bloody nose"    Penicillin G Other (See Comments)    Has patient had a PCN reaction causing immediate rash, facial/tongue/throat swelling, SOB or lightheadedness with hypotension: No Has patient had a PCN reaction causing severe rash involving mucus membranes or skin necrosis: Unknown Has patient had a PCN reaction that required hospitalization: Unknown Has patient had a PCN reaction occurring within the last 10 years: Unknown If all of the above answers are "NO", then may proceed with Cephalosporin use.    Follow-up Information     McLean-Scocuzza, Nino Glow, MD Follow up in 1 week(s).   Specialty: Internal Medicine Why: hospital discharge follow up, repeat cbc/bmp at follow up Contact information: [place] [address] [phone]                  The results of significant diagnostics from this  hospitalization (including imaging, microbiology, ancillary and laboratory) are listed below for reference.    Significant Diagnostic Studies: DG Chest Port 1 View  Result Date: 10/25/2022 CLINICAL DATA:  Wheezing. EXAM: PORTABLE CHEST 1 VIEW COMPARISON:  August 13, 2021. FINDINGS: Stable cardiomediastinal silhouette. Mild central pulmonary vascular congestion is noted. Mild bibasilar subsegmental atelectasis is noted. Hypoinflation of the lungs is noted. Bony thorax is unremarkable. IMPRESSION: Mild central pulmonary vascular congestion. Hypoinflation of the lungs with mild bibasilar subsegmental atelectasis. Electronically Signed   By: Marijo Conception M.D.   On: 10/25/2022 10:55    Microbiology: Recent Results (from the past 240 hour(s))  Urine Culture     Status: Abnormal   Collection Time: 10/23/22  5:21 PM   Specimen: Urine, Clean Catch  Result Value Ref Range Status   Specimen Description URINE, CLEAN CATCH  Final   Special Requests   Final    NONE Performed at [facility], [place] [address], [place], [place] [address]    Culture MULTIPLE SPECIES PRESENT, SUGGEST RECOLLECTION (A)  Final   Report Status 10/25/2022 FINAL  Final  Resp Panel by RT-PCR (Flu A&B, Covid) Anterior Nasal Swab     Status: None   Collection Time: 10/24/22  1:02 AM   Specimen: Anterior Nasal Swab  Result Value Ref Range Status   SARS Coronavirus 2 by RT PCR NEGATIVE NEGATIVE Final    Comment: (NOTE) SARS-CoV-2 target nucleic acids are NOT DETECTED.  The SARS-CoV-2 RNA is generally detectable in upper respiratory specimens during the acute phase of infection. The lowest concentration of SARS-CoV-2 viral copies this assay can detect is 138 copies/mL. A negative result does not preclude SARS-Cov-2 infection and should not be used as the sole basis for treatment or other patient management decisions. A negative result may occur with  improper specimen collection/handling, submission of specimen  other than nasopharyngeal swab, presence of viral mutation(s) within the areas targeted by this assay, and inadequate number of viral copies(<138 copies/mL). A negative result must be combined with clinical observations, patient history, and epidemiological information. The expected result is Negative.  Fact Sheet for Patients:  EntrepreneurPulse.com.au  Fact Sheet for Healthcare Providers:  IncredibleEmployment.be  This test is no t yet approved or cleared by the [place] FDA and  has been authorized for detection and/or diagnosis of SARS-CoV-2 by FDA under an Emergency Use Authorization (EUA). This EUA will remain  in effect (meaning this test can be used) for the duration of the COVID-19 declaration under Section 564(b)(1) of the Act, 21 U.S.C.section 360bbb-3(b)(1), unless the authorization is terminated  or revoked sooner.       Influenza A by PCR NEGATIVE NEGATIVE Final   Influenza B by PCR NEGATIVE NEGATIVE Final    Comment: (NOTE) The Xpert Xpress SARS-CoV-2/FLU/RSV plus assay is intended as an aid in the diagnosis of influenza from Nasopharyngeal swab specimens and should not be used as a sole basis for treatment. Nasal washings and aspirates are unacceptable for Xpert Xpress SARS-CoV-2/FLU/RSV testing.  Fact Sheet for Patients: EntrepreneurPulse.com.au  Fact Sheet for Healthcare Providers: IncredibleEmployment.be  This test is not yet approved or cleared by the [place] FDA and has been authorized for detection and/or diagnosis of SARS-CoV-2 by FDA under an Emergency Use Authorization (EUA). This EUA will remain in effect (meaning this test can be used) for the duration of the COVID-19 declaration under Section 564(b)(1) of the Act, 21 U.S.C. section 360bbb-3(b)(1), unless the authorization is terminated or revoked.  Performed at [facility], [address]  [place], [place] [address]   Urine Culture     Status: None   Collection Time: 10/24/22  4:09 AM   Specimen: Urine, Clean Catch  Result Value Ref Range Status   Specimen Description   Final    URINE, CLEAN CATCH Performed at [facility], [address] [place], [place] [address]    Special Requests   Final    NONE Performed at [facility], [address] [place], [place] [address]    Culture   Final    NO GROWTH Performed at [facility], [place] [address], [place],  [address]    Report Status 10/25/2022 FINAL  Final     Labs: Basic Metabolic Panel: Recent Labs  Lab 10/23/22 2314 10/24/22 0450 10/26/22 0420 10/27/22 0615  NA 144 145 142 145  K 4.2 4.2 3.5 3.8  CL 102 106 102 103  CO2 34* 31 35* 35*  GLUCOSE 163* 122* 118* 130*  BUN _0 CREATININE 1.29* 1.28* 1.05* 1.10*  CALCIUM 8.5* 7.8* 8.2* 8.6*  MG  --   --  1.9 2.0   Liver Function Tests: Recent Labs  Lab 10/23/22 2314  AST 30  ALT 19  ALKPHOS 62  BILITOT 1.0  PROT 6.8  ALBUMIN 3.7   No results for input(s): "LIPASE", "AMYLASE" in the last 168 hours. No results for input(s): "AMMONIA" in the last 168 hours. CBC: Recent Labs  Lab 10/23/22 2314 10/24/22 0450 10/27/22 0615  WBC 8.8 6.8 4.8  HGB 13.3 11.8* 11.7*  HCT 41.9 37.5 36.7  MCV 96.3 96.6 94.8  PLT 157 138* 135*   Cardiac Enzymes: No results for input(s): "CKTOTAL", "CKMB", "CKMBINDEX", "TROPONINI" in the last 168 hours. BNP: BNP (last 3 results)  No results for input(s): "BNP" in the last 8760 hours.  ProBNP (last 3 results) No results for input(s): "PROBNP" in the last 8760 hours.  CBG: No results for input(s): "GLUCAP" in the last 168 hours.  FURTHER DISCHARGE INSTRUCTIONS:   Get Medicines reviewed and adjusted: Please take all your medications with you for your next visit with your Primary MD   Laboratory/radiological data: Please request your Primary MD to go over all hospital tests  and procedure/radiological results at the follow up, please ask your Primary MD to get all Hospital records sent to his/her office.   In some cases, they will be blood work, cultures and biopsy results pending at the time of your discharge. Please request that your primary care M.D. goes through all the records of your hospital data and follows up on these results.   Also Note the following: If you experience worsening of your admission symptoms, develop shortness of breath, life threatening emergency, suicidal or homicidal thoughts you must seek medical attention immediately by calling 911 or calling your MD immediately  if symptoms less severe.   You must read complete instructions/literature along with all the possible adverse reactions/side effects for all the Medicines you take and that have been prescribed to you. Take any new Medicines after you have completely understood and accpet all the possible adverse reactions/side effects.    Do not drive when taking Pain medications or sleeping medications (Benzodaizepines)   Do not take more than prescribed Pain, Sleep and Anxiety Medications. It is not advisable to combine anxiety,sleep and pain medications without talking with your primary care practitioner   Special Instructions: If you have smoked or chewed Tobacco  in the last 2 yrs please stop smoking, stop any regular Alcohol  and or any Recreational drug use.   Wear Seat belts while driving.   Please note: You were cared for by a hospitalist during your hospital stay. Once you are discharged, your primary care physician will handle any further medical issues. Please note that NO REFILLS for any discharge medications will be authorized once you are discharged, as it is imperative that you return to your primary care physician (or establish a relationship with a primary care physician if you do not have one) for your post hospital discharge needs so that they can reassess your need for  medications and monitor your lab values.     Signed:  Florencia Reasons MD, PhD, FACP  [facility] 10/27/2022, 8:55 AM

## 2022-10-30 NOTE — Telephone Encounter (Signed)
Patient recently hospitalized.  She was Dr Aundra Dubin pt and has f/u with Dr. Volanda Napoleon however she would need sooner appointment for hospital follow-up.  Please schedule with myself or any other provider in this office for hospital follow up

## 2022-10-31 ENCOUNTER — Telehealth: Payer: Self-pay | Admitting: *Deleted

## 2022-10-31 NOTE — Patient Outreach (Signed)
  Care Coordination TOC Note Transition Care Management Unsuccessful Follow-up Telephone Call  Date of discharge and from where:  [facility] 10/27/2022  Attempts:  1st Attempt  Reason for unsuccessful TCM follow-up call:  Unable to leave message Voicemail not set up  [place] Management [phone]

## 2022-11-01 ENCOUNTER — Telehealth: Payer: Self-pay | Admitting: *Deleted

## 2022-11-01 NOTE — Patient Outreach (Signed)
  Care Coordination [facility] Note Transition Care Management Unsuccessful Follow-up Telephone Call  Date of discharge and from where:  [facility] 10/27/2022  Attempts:  2nd Attempt  Reason for unsuccessful TCM follow-up call:  Unable to leave message  [place] Management [phone]

## 2022-11-03 ENCOUNTER — Telehealth: Payer: Self-pay

## 2022-11-03 ENCOUNTER — Ambulatory Visit (INDEPENDENT_AMBULATORY_CARE_PROVIDER_SITE_OTHER): Payer: Medicare PPO

## 2022-11-03 ENCOUNTER — Encounter: Payer: Self-pay | Admitting: Family Medicine

## 2022-11-03 ENCOUNTER — Ambulatory Visit: Payer: Medicare PPO | Admitting: Family Medicine

## 2022-11-03 VITALS — BP 108/62 | HR 70 | Temp 98.8°F | Ht 67.0 in | Wt 194.4 lb

## 2022-11-03 DIAGNOSIS — Z09 Encounter for follow-up examination after completed treatment for conditions other than malignant neoplasm: Secondary | ICD-10-CM | POA: Insufficient documentation

## 2022-11-03 DIAGNOSIS — N390 Urinary tract infection, site not specified: Secondary | ICD-10-CM | POA: Insufficient documentation

## 2022-11-03 DIAGNOSIS — J441 Chronic obstructive pulmonary disease with (acute) exacerbation: Secondary | ICD-10-CM | POA: Diagnosis not present

## 2022-11-03 DIAGNOSIS — R051 Acute cough: Secondary | ICD-10-CM

## 2022-11-03 DIAGNOSIS — N3000 Acute cystitis without hematuria: Secondary | ICD-10-CM | POA: Diagnosis not present

## 2022-11-03 DIAGNOSIS — R600 Localized edema: Secondary | ICD-10-CM | POA: Diagnosis not present

## 2022-11-03 LAB — POC COVID19 BINAXNOW: SARS Coronavirus 2 Ag: NEGATIVE

## 2022-11-03 MED ORDER — PREDNISONE 20 MG PO TABS: 40.0000 mg | ORAL_TABLET | Freq: Every day | ORAL | 0 refills | Status: DC

## 2022-11-03 MED ORDER — DOXYCYCLINE HYCLATE 100 MG PO TABS: 100.0000 mg | ORAL_TABLET | Freq: Two times a day (BID) | ORAL | 0 refills | Status: DC

## 2022-11-03 NOTE — Assessment & Plan Note (Signed)
Per patient, she was told to stop wearing compression stockings. Unsure why the doctor at the hospital told patient to remove them. Advised patient to wear them while she is awake and then take them off at night.

## 2022-11-03 NOTE — Assessment & Plan Note (Signed)
Urinary symptoms have improved since hospital discharge. Patient will let us know if any symptoms of burning, frequency, or urgency return.

## 2022-11-03 NOTE — Assessment & Plan Note (Addendum)
Currently in an episode of COPD exacerbation.  Rapid COVID test was negative.  We will do a chest x-ray, and start her on prednisone 40 mg daily and doxycycline '100mg'$  BID to treat her. Also advised patient to use her albuterol inhaler every 6 hours while she is awake for two days and then use the inhaler every 6 hours as needed for COPD symptoms.  If she has any worsening symptoms, fevers, cough or low blood, worsening shortness of breath, or any new symptoms she will seek medical attention.

## 2022-11-03 NOTE — Patient Outreach (Signed)
  Care Coordination TOC Note Transition Care Management Follow-up Telephone Call Date of discharge and from where: 10/27/22-[facility] Dx: "acute lower UTI" How have you been since you were released from the hospital? Call completed with son. He reports that patient is having "some congestion." They are taking her to MD for appt to be evaluated this afternoon. He is currently not with patient. She has paid caregivers in the home to assist patient.  Any questions or concerns? No  Items Reviewed: Did the pt receive and understand the discharge instructions provided? Yes  Medications obtained and verified?  Unable to complete med review with son at present but he confirms that they have all meds and no issues with them. Patient has completed abx therapy.  Other? Yes  Any new allergies since your discharge? No  Dietary orders reviewed? Yes Do you have support at home? Yes   Home Care and Equipment/Supplies: Were home health services ordered? yes If so, what is the name of the agency? [facility]  Has the agency set up a time to come to the patient's home? Son reports that patient in transition from changing PCP as previous one left. HH was unable to get orders signed until face to face visit completed at PCP office-which is scheduled for today.  Were any new equipment or medical supplies ordered?  No What is the name of the medical supply agency? N/A Were you able to get the supplies/equipment? not applicable Do you have any questions related to the use of the equipment or supplies? No  Functional Questionnaire: (I = Independent and D = Dependent) ADLs: A  Bathing/Dressing- A  Meal Prep- A  Eating- I  Maintaining continence- A  Transferring/Ambulation- A  Managing Meds- A  Follow up appointments reviewed:  PCP Hospital f/u appt confirmed? Yes  Scheduled to see Dr. Caryl Bis today at 2:45pm. [facility] f/u appt confirmed? N/A . Are transportation arrangements needed? No  If  their condition worsens, is the pt aware to call PCP or go to the Emergency Dept.? Yes Was the patient provided with contact information for the PCP's office or ED? Yes Was to pt encouraged to call back with questions or concerns? Yes  SDOH assessments and interventions completed:   No SDOH Interventions Today    Flowsheet Row Most Recent Value  SDOH Interventions   Food Insecurity Interventions Intervention Not Indicated  Transportation Interventions Intervention Not Indicated       Care Coordination Interventions:  Education provided    Encounter Outcome:  Pt. Visit Completed    Enzo Montgomery, RN,BSN,CCM [place] Management Telephonic Care Management Coordinator Direct Phone: [phone] Toll Free: [phone] Fax: [phone]

## 2022-11-03 NOTE — Progress Notes (Unsigned)
Tommi Rumps, MD Phone: [phone]  Jessica Erickson is a 88 y.o. female who presents today for hospital f/u.  UTI: Patient is deaf and is accompanied by caregiver. They are using a white board to communicate at their preference. The patient states that she was in the hospital for a "kidney infection." She denies burning, urgency, and frequency. She reports that she completed her Omnicef course prescribed from the hospital.   Lymphedema: She also has bilateral lower extremity edema. She states that a doctor at the hospital told her to stop wearing her compression stockings.  COPD exacerbation: patient reports that a day after she was discharged she started coughing with greater frequency and intensity. This is her sixth day of symptoms. She reports an increased production of clear sputum that is pink-tinged. She endorses wheezing, chest tightness, and occasional shortness of breath. She denies chest pain.  She uses her albuterol once daily.  Social History   Tobacco Use  Smoking Status Former   Packs/day: 0.50   Years: 20.00   Total pack years: 10.00   Types: Cigarettes   Quit date: 09/20/1995   Years since quitting: 27.1  Smokeless Tobacco Never  Tobacco Comments   quit 1996 smoked 20 years max 8 cig qd     Current Outpatient Medications on File Prior to Visit  Medication Sig Dispense Refill   Accu-Chek Softclix Lancets lancets Use as instructed 100 each 12   acetaminophen (TYLENOL) 500 MG tablet Take 2 tablets (1,000 mg total) by mouth 2 (two) times daily as needed. 360 tablet 3   albuterol (PROVENTIL) (2.5 MG/3ML) 0.083% nebulizer solution Take 3 mLs (2.5 mg total) by nebulization in the morning, at noon, and at bedtime. 360 mL 11   apixaban (ELIQUIS) 2.5 MG TABS tablet Take 1 tablet (2.5 mg total) by mouth 2 (two) times daily. 180 tablet 3   atorvastatin (LIPITOR) 40 MG tablet Take 1 tablet (40 mg total) by mouth daily at 6 PM. Generic ok 90 tablet 3   Blood Glucose  Monitoring Suppl (ACCU-CHEK AVIVA PLUS) w/Device KIT Use as directed 1 kit 0   calcium-vitamin D (OSCAL WITH D) 500-5 MG-MCG tablet Take 1 tablet by mouth 2 (two) times daily. Unable to find former dose 600ca/400 vitamin D inform patient dose change Oscal 180 tablet 3   cetirizine (ZYRTEC) 10 MG tablet Take 1 tablet (10 mg total) by mouth daily. 90 tablet 3   feeding supplement, GLUCERNA SHAKE, (GLUCERNA SHAKE) LIQD Take 237 mLs by mouth 2 (two) times daily between meals.  0   Fluticasone-Umeclidin-Vilant (TRELEGY ELLIPTA) 100-62.5-25 MCG/ACT AEPB INHALE ONE PUFF DAILY. RINSE MOUTH AFTERUSE. STOP SYMBICORT & SPIRIVA 60 each 11   furosemide (LASIX) 40 MG tablet TAKE ONE TABLET (40MG) DAILY. MAY TAKE ASECOND DOSE OF 40MG IF NEEDED AT LUNCH FOR 5 DAYS 120 tablet 3   gabapentin (NEURONTIN) 100 MG capsule TAKE 2 CAPSULES BY MOUTH 3 TIMES DAILY. 504 capsule 3   glucose blood test strip Use to test blood sugar twice daily. 200 each 3   guaiFENesin (MUCINEX) 600 MG 12 hr tablet Take 2 tablets (1,200 mg total) by mouth 2 (two) times daily as needed for cough or to loosen phlegm.     ipratropium-albuterol (DUONEB) 0.5-2.5 (3) MG/3ML SOLN Take 3 mLs by nebulization every 4 (four) hours as needed. 360 mL 11   levothyroxine (SYNTHROID) 175 MCG tablet Take 1 tablet (175 mcg total) by mouth daily before breakfast. D/c 200 mcg dose Do not take with  other medications or vitamins 90 tablet 3   Lidocaine-Menthol (ICY HOT MAX LIDOCAINE) 4-1 % CREA Apply 1 application. topically 3 (three) times daily with meals as needed. Dispense cream or spray pt preference 120 g 11   Melatonin 10 MG TABS Take 10 mg by mouth at bedtime. 90 tablet 3   mirtazapine (REMERON) 15 MG tablet Take 1 tablet (15 mg total) by mouth at bedtime. 90 tablet 3   Multiple Vitamin (MULTIVITAMIN WITH MINERALS) TABS tablet Take 1 tablet by mouth daily.      pantoprazole (PROTONIX) 40 MG tablet Take 1 tablet (40 mg total) by mouth daily. 30 minutes before  lunch or dinner 90 tablet 3   polyethylene glycol powder (GLYCOLAX/MIRALAX) 17 GM/SCOOP powder Take 17 g by mouth daily. 255 g 11   polyvinyl alcohol (LIQUIFILM TEARS) 1.4 % ophthalmic solution Place 1 drop into both eyes at bedtime. 15 mL 0   No current facility-administered medications on file prior to visit.     ROS see history of present illness  Objective  Vitals:   11/03/22 1451  BP: 108/62  Pulse: 70  Temp: 98.8 F (37.1 C)  SpO2: 95%    BP Readings from Last 3 Encounters:  11/03/22 108/62  10/27/22 (!) 108/40  10/23/22 113/73   Wt Readings from Last 3 Encounters:  11/03/22 194 lb 6.4 oz (88.2 kg)  10/27/22 195 lb 12.3 oz (88.8 kg)  07/25/22 194 lb (88 kg)    Physical Exam Constitutional:      Appearance: Normal appearance.  HENT:     Head: Normocephalic and atraumatic.  Cardiovascular:     Rate and Rhythm: Normal rate and regular rhythm.  Pulmonary:     Effort: Pulmonary effort is normal. No respiratory distress.     Comments: Wheezing and coarse breath sounds present in all lung fields bilaterally. Abdominal:     Palpations: Abdomen is soft.     Tenderness: There is no abdominal tenderness.  Skin:    General: Skin is warm and dry.  Neurological:     Mental Status: She is alert.     Assessment/Plan: Please see individual problem list.  Problem List Items Addressed This Visit     Bilateral lower extremity edema (Chronic)    Likely related to lymphedema.  Per patient, she was told to stop wearing compression stockings. Unsure why the doctor at the hospital told patient to remove them. Advised patient to wear them while she is awake and then take them off at night.      COPD (chronic obstructive pulmonary disease) (HCC) - Primary (Chronic)    Currently in an episode of COPD exacerbation.  Rapid COVID test was negative.  We will do a chest x-ray, and start her on prednisone 40 mg daily and doxycycline 151m BID to treat her. Also advised patient to use  her albuterol inhaler every 6 hours while she is awake for two days and then use the inhaler every 6 hours as needed for COPD symptoms.  If she has any worsening symptoms, fevers, cough or low blood, worsening shortness of breath, or any new symptoms she will seek medical attention.      Relevant Medications   predniSONE (DELTASONE) 20 MG tablet   UTI (urinary tract infection)    Urinary symptoms have improved since hospital discharge. Patient will let [place] if any symptoms of burning, frequency, or urgency return.       Other Visit Diagnoses     Acute cough  Relevant Orders   DG Chest 2 View (Completed)   POC COVID-19 (Completed)   COPD exacerbation (HCC)       Relevant Medications   predniSONE (DELTASONE) 20 MG tablet   doxycycline (VIBRA-TABS) 100 MG tablet   Other Relevant Orders   CBC w/Diff   BASIC METABOLIC PANEL WITH GFR   Ambulatory referral to [facility]        Return if symptoms worsen or fail to improve.   Tommi Rumps, Medical Student [place]

## 2022-11-03 NOTE — Patient Instructions (Addendum)
Nice to meet you. We are going to treat you for a COPD exacerbation with prednisone and doxycycline. There is some risk of difficulty sleeping, increased appetite, and agitation with the prednisone.  If you have excessive issues with those things please let us know.  There is the potential that your sugar could go up with the prednisone and if you notice this please let us know. Any antibiotic can cause diarrhea.  If you have excessive watery stools with the doxycycline please let me know.  Doxycycline can cause skin sensitivity to the sun.  If you do go out in the sun please wear good sun protection. I would like for you to use your albuterol inhaler or nebulizer every 6 hours for the next 2 days and then every 6 hours as needed after that. If you start to cough up bright red blood, get increasingly short of breath, develop chest pain, or fevers please seek medical attention immediately.  If your symptoms are worsening please seek medical attention.

## 2022-11-06 ENCOUNTER — Telehealth: Payer: Self-pay

## 2022-11-06 ENCOUNTER — Emergency Department: Payer: Medicare PPO

## 2022-11-06 ENCOUNTER — Observation Stay
Admission: EM | Admit: 2022-11-06 | Discharge: 2022-11-08 | Disposition: A | Discharge status: Home Health | Payer: Medicare PPO | Attending: Hospitalist | Admitting: Hospitalist

## 2022-11-06 DIAGNOSIS — J441 Chronic obstructive pulmonary disease with (acute) exacerbation: Secondary | ICD-10-CM

## 2022-11-06 DIAGNOSIS — Z8673 Personal history of transient ischemic attack (TIA), and cerebral infarction without residual deficits: Secondary | ICD-10-CM | POA: Insufficient documentation

## 2022-11-06 DIAGNOSIS — Z86711 Personal history of pulmonary embolism: Secondary | ICD-10-CM | POA: Insufficient documentation

## 2022-11-06 DIAGNOSIS — J45909 Unspecified asthma, uncomplicated: Secondary | ICD-10-CM | POA: Diagnosis not present

## 2022-11-06 DIAGNOSIS — E1122 Type 2 diabetes mellitus with diabetic chronic kidney disease: Secondary | ICD-10-CM | POA: Diagnosis not present

## 2022-11-06 DIAGNOSIS — Z66 Do not resuscitate: Secondary | ICD-10-CM | POA: Insufficient documentation

## 2022-11-06 DIAGNOSIS — I129 Hypertensive chronic kidney disease with stage 1 through stage 4 chronic kidney disease, or unspecified chronic kidney disease: Secondary | ICD-10-CM | POA: Insufficient documentation

## 2022-11-06 DIAGNOSIS — G9340 Encephalopathy, unspecified: Secondary | ICD-10-CM

## 2022-11-06 DIAGNOSIS — J9601 Acute respiratory failure with hypoxia: Principal | ICD-10-CM | POA: Diagnosis present

## 2022-11-06 DIAGNOSIS — Z85841 Personal history of malignant neoplasm of brain: Secondary | ICD-10-CM | POA: Insufficient documentation

## 2022-11-06 DIAGNOSIS — J9602 Acute respiratory failure with hypercapnia: Secondary | ICD-10-CM

## 2022-11-06 DIAGNOSIS — E039 Hypothyroidism, unspecified: Secondary | ICD-10-CM | POA: Diagnosis not present

## 2022-11-06 DIAGNOSIS — I5032 Chronic diastolic (congestive) heart failure: Secondary | ICD-10-CM

## 2022-11-06 DIAGNOSIS — Z85828 Personal history of other malignant neoplasm of skin: Secondary | ICD-10-CM | POA: Insufficient documentation

## 2022-11-06 DIAGNOSIS — Z1152 Encounter for screening for COVID-19: Secondary | ICD-10-CM | POA: Insufficient documentation

## 2022-11-06 DIAGNOSIS — Z86718 Personal history of other venous thrombosis and embolism: Secondary | ICD-10-CM | POA: Insufficient documentation

## 2022-11-06 DIAGNOSIS — Z87891 Personal history of nicotine dependence: Secondary | ICD-10-CM | POA: Diagnosis not present

## 2022-11-06 DIAGNOSIS — R0602 Shortness of breath: Secondary | ICD-10-CM | POA: Diagnosis present

## 2022-11-06 DIAGNOSIS — I5033 Acute on chronic diastolic (congestive) heart failure: Secondary | ICD-10-CM | POA: Diagnosis not present

## 2022-11-06 DIAGNOSIS — I2510 Atherosclerotic heart disease of native coronary artery without angina pectoris: Secondary | ICD-10-CM | POA: Insufficient documentation

## 2022-11-06 DIAGNOSIS — Z79899 Other long term (current) drug therapy: Secondary | ICD-10-CM | POA: Diagnosis not present

## 2022-11-06 DIAGNOSIS — N1832 Chronic kidney disease, stage 3b: Secondary | ICD-10-CM | POA: Diagnosis present

## 2022-11-06 LAB — COMPREHENSIVE METABOLIC PANEL
ALT: 16 U/L (ref 0–44)
AST: 27 U/L (ref 15–41)
Albumin: 3.4 g/dL — ABNORMAL LOW (ref 3.5–5.0)
Alkaline Phosphatase: 53 U/L (ref 38–126)
Anion gap: 10 (ref 5–15)
BUN: 19 mg/dL (ref 8–23)
CO2: 32 mmol/L (ref 22–32)
Calcium: 8.3 mg/dL — ABNORMAL LOW (ref 8.9–10.3)
Chloride: 101 mmol/L (ref 98–111)
Creatinine, Ser: 1.47 mg/dL — ABNORMAL HIGH (ref 0.44–1.00)
GFR, Estimated: 33 mL/min — ABNORMAL LOW (ref >60)
Glucose, Bld: 144 mg/dL — ABNORMAL HIGH (ref 70–99)
Potassium: 3.3 mmol/L — ABNORMAL LOW (ref 3.5–5.1)
Sodium: 143 mmol/L (ref 135–145)
Total Bilirubin: 0.7 mg/dL (ref 0.3–1.2)
Total Protein: 6.5 g/dL (ref 6.5–8.1)

## 2022-11-06 LAB — CBC WITH DIFFERENTIAL/PLATELET
Abs Immature Granulocytes: 0.04 10*3/uL (ref 0.00–0.07)
Basophils Absolute: 0 10*3/uL (ref 0.0–0.1)
Basophils Relative: 0 %
Eosinophils Absolute: 0 10*3/uL (ref 0.0–0.5)
Eosinophils Relative: 1 %
HCT: 39.2 % (ref 36.0–46.0)
Hemoglobin: 12 g/dL (ref 12.0–15.0)
Immature Granulocytes: 1 %
Lymphocytes Relative: 30 %
Lymphs Abs: 2.3 10*3/uL (ref 0.7–4.0)
MCH: 30.1 pg (ref 26.0–34.0)
MCHC: 30.6 g/dL (ref 30.0–36.0)
MCV: 98.2 fL (ref 80.0–100.0)
Monocytes Absolute: 0.9 10*3/uL (ref 0.1–1.0)
Monocytes Relative: 12 %
Neutro Abs: 4.3 10*3/uL (ref 1.7–7.7)
Neutrophils Relative %: 56 %
Platelets: 170 10*3/uL (ref 150–400)
RBC: 3.99 MIL/uL (ref 3.87–5.11)
RDW: 13.1 % (ref 11.5–15.5)
WBC: 7.6 10*3/uL (ref 4.0–10.5)
nRBC: 0 % (ref 0.0–0.2)

## 2022-11-06 LAB — BRAIN NATRIURETIC PEPTIDE: B Natriuretic Peptide: 198 pg/mL — ABNORMAL HIGH (ref 0.0–100.0)

## 2022-11-06 LAB — RESP PANEL BY RT-PCR (FLU A&B, COVID) ARPGX2
Influenza A by PCR: NEGATIVE
Influenza B by PCR: NEGATIVE
SARS Coronavirus 2 by RT PCR: NEGATIVE

## 2022-11-06 LAB — PROCALCITONIN: Procalcitonin: <0.1 ng/mL

## 2022-11-06 LAB — TROPONIN I (HIGH SENSITIVITY)
Troponin I (High Sensitivity): 13 ng/L (ref <18)
Troponin I (High Sensitivity): 12 ng/L (ref <18)

## 2022-11-06 MED ORDER — ATORVASTATIN CALCIUM 20 MG PO TABS
40.0000 mg | ORAL_TABLET | Freq: Every day | ORAL | Status: DC
  Administered 2022-11-07: 40 mg via ORAL
  Filled 2022-11-06: qty 2

## 2022-11-06 MED ORDER — ACETAMINOPHEN 325 MG PO TABS: 650.0000 mg | ORAL_TABLET | Freq: Four times a day (QID) | ORAL | Status: DC | PRN

## 2022-11-06 MED ORDER — BISACODYL 5 MG PO TBEC: 5.0000 mg | DELAYED_RELEASE_TABLET | Freq: Every day | ORAL | Status: DC | PRN

## 2022-11-06 MED ORDER — ONDANSETRON HCL 4 MG PO TABS: 4.0000 mg | ORAL_TABLET | Freq: Four times a day (QID) | ORAL | Status: DC | PRN

## 2022-11-06 MED ORDER — ACETAMINOPHEN 325 MG RE SUPP: 650.0000 mg | Freq: Four times a day (QID) | RECTAL | Status: DC | PRN

## 2022-11-06 MED ORDER — LEVOTHYROXINE SODIUM 50 MCG PO TABS
175.0000 ug | ORAL_TABLET | Freq: Every day | ORAL | Status: DC
  Administered 2022-11-07: 175 ug via ORAL
  Filled 2022-11-06: qty 4

## 2022-11-06 MED ORDER — FUROSEMIDE 10 MG/ML IJ SOLN
40.0000 mg | Freq: Once | INTRAMUSCULAR | Status: AC
  Administered 2022-11-06: 40 mg via INTRAVENOUS
  Filled 2022-11-06: qty 4

## 2022-11-06 MED ORDER — PANTOPRAZOLE SODIUM 40 MG PO TBEC
40.0000 mg | DELAYED_RELEASE_TABLET | Freq: Every day | ORAL | Status: DC
  Administered 2022-11-07: 40 mg via ORAL
  Filled 2022-11-06: qty 1

## 2022-11-06 MED ORDER — ONDANSETRON HCL 4 MG/2ML IJ SOLN: 4.0000 mg | Freq: Four times a day (QID) | INTRAMUSCULAR | Status: DC | PRN

## 2022-11-06 MED ORDER — SODIUM CHLORIDE 0.9% FLUSH
3.0000 mL | Freq: Two times a day (BID) | INTRAVENOUS | Status: DC
  Administered 2022-11-06 – 2022-11-07 (×2): 3 mL via INTRAVENOUS

## 2022-11-06 MED ORDER — APIXABAN 2.5 MG PO TABS
2.5000 mg | ORAL_TABLET | Freq: Two times a day (BID) | ORAL | Status: DC
  Administered 2022-11-06 – 2022-11-07 (×2): 2.5 mg via ORAL
  Filled 2022-11-06 (×5): qty 1

## 2022-11-06 MED ORDER — SODIUM CHLORIDE 0.9 % IV SOLN
2.0000 g | INTRAVENOUS | Status: DC
  Administered 2022-11-06: 2 g via INTRAVENOUS
  Filled 2022-11-06: qty 20

## 2022-11-06 MED ORDER — FLUTICASONE FUROATE-VILANTEROL 100-25 MCG/ACT IN AEPB
1.0000 | INHALATION_SPRAY | Freq: Every day | RESPIRATORY_TRACT | Status: DC
  Administered 2022-11-06: 1 via RESPIRATORY_TRACT
  Filled 2022-11-06: qty 28

## 2022-11-06 MED ORDER — UMECLIDINIUM BROMIDE 62.5 MCG/ACT IN AEPB
1.0000 | INHALATION_SPRAY | Freq: Every day | RESPIRATORY_TRACT | Status: DC
  Administered 2022-11-06: 1 via RESPIRATORY_TRACT
  Filled 2022-11-06: qty 7

## 2022-11-06 MED ORDER — ENOXAPARIN SODIUM 30 MG/0.3ML IJ SOSY: 30.0000 mg | PREFILLED_SYRINGE | INTRAMUSCULAR | Status: DC

## 2022-11-06 MED ORDER — SODIUM CHLORIDE 0.9 % IV SOLN
500.0000 mg | INTRAVENOUS | Status: DC
  Administered 2022-11-06 – 2022-11-07 (×2): 500 mg via INTRAVENOUS
  Filled 2022-11-06 (×2): qty 5

## 2022-11-06 MED ORDER — POLYETHYLENE GLYCOL 3350 17 G PO PACK: 17.0000 g | PACK | Freq: Every day | ORAL | Status: DC | PRN

## 2022-11-06 MED ORDER — IPRATROPIUM-ALBUTEROL 0.5-2.5 (3) MG/3ML IN SOLN
3.0000 mL | Freq: Four times a day (QID) | RESPIRATORY_TRACT | Status: DC
  Administered 2022-11-06 – 2022-11-08 (×5): 3 mL via RESPIRATORY_TRACT
  Filled 2022-11-06 (×5): qty 3

## 2022-11-06 MED ORDER — MIRTAZAPINE 15 MG PO TABS
15.0000 mg | ORAL_TABLET | Freq: Every day | ORAL | Status: DC
  Administered 2022-11-06 – 2022-11-07 (×2): 15 mg via ORAL
  Filled 2022-11-06 (×2): qty 1

## 2022-11-06 MED ORDER — GABAPENTIN 100 MG PO CAPS
200.0000 mg | ORAL_CAPSULE | Freq: Three times a day (TID) | ORAL | Status: DC
  Administered 2022-11-06 – 2022-11-07 (×3): 200 mg via ORAL
  Filled 2022-11-06 (×3): qty 2

## 2022-11-06 NOTE — Assessment & Plan Note (Signed)
Patient with several day history of increased cough and sputum production with 1 day history of shortness of breath.  No wheezing on examination at this time, however patient has received numerous DuoNeb treatments.  - Start DuoNebs every 6 hours - S/p Solu-Medrol 125 mg - Restart prednisone 40 mg daily tomorrow - Start ceftriaxone and azithromycin - Procalcitonin - RVP - If procalcitonin is negative, can consider discontinuing antibiotics

## 2022-11-06 NOTE — Assessment & Plan Note (Signed)
Patient presenting with several day history of increased cough and sputum production with 1 day history of increased shortness of breath.  She is normally on 2.5 L at baseline, however currently requiring BiPAP.  I suspect this is multifactorial, including COPD exacerbation and hypervolemia.  Patient also has a history of pulmonary hypertension that may be playing a role.  - Continue BiPAP for increased work of breathing - Wean as tolerated - Management of COPD and CHF as noted below - Echocardiogram ordered

## 2022-11-06 NOTE — ED Notes (Signed)
Pt provided sandwich tray for meal

## 2022-11-06 NOTE — ED Provider Notes (Signed)
[facility] Provider Note    Event Date/Time   First MD Initiated Contact with Patient 11/06/22 1325     (approximate)   History   Shortness of Breath   HPI  Jessica Erickson is a 88 y.o. female with history of COPD, CHF, coronary disease, diabetes who comes in with concerns for respiratory distress.  Patient at baseline is on 2.5 L of oxygen.  Reportedly started feeling more short of breath yesterday and into this morning.  Patient with significant respiratory effort with EMS with audible wheezing.  No allergic reactions, no falls or hitting her head.  Patient was given 3 DuoNebs, Solu-Medrol.  Patient was transported here for further evaluation.  To note patient is on Eliquis for prior DVT with a IVC filter.  Patient has needed IV Lasix previously.  I talked to his legal guardian who is the son who does report that patient is DNR.  We discussed about intubation does not sound that they would want to let that needed to come to the.  They do want medical management with IV fluids or IV medications or IV antibiotics if needed.  They does report that he was seen by the primary care doctor on 12/1 and is prescribed some antibiotics and steroids.  Had skipped the steroid this morning due to not liking the way it made her feel.  He is also denies any known falls.  He just reports that she has been having this breathing issues and that he originally thought she was getting better until today when she started feeling more short of breath    Physical Exam   Triage Vital Signs: Blood pressure 133/61, pulse 70, temperature 97.9 F (36.6 C), temperature source Oral, height '5\' 7"'$  (1.702 m), weight 92 kg, SpO2 100 %.  Most recent vital signs: Vitals:   11/06/22 1334  BP: 133/61  Pulse: 70  Temp: 97.9 F (36.6 C)  SpO2: 100%     General: Awake, no distress.  CV:  Good peripheral perfusion.  Resp:  Normal effort.  Abd:  No distention.  Other:  Upper airway wheezing  noted.  Poor aeration of her lungs.  2+ swelling noted bilaterally in her legs.  No swelling of her oral pharynx or rash noted.   ED Results / Procedures / Treatments   Labs (all labs ordered are listed, but only abnormal results are displayed) Labs Reviewed  RESP PANEL BY RT-PCR (FLU A&B, COVID) [id number]  CBC WITH DIFFERENTIAL/PLATELET  COMPREHENSIVE METABOLIC PANEL  BRAIN NATRIURETIC PEPTIDE  BLOOD GAS, VENOUS  URINALYSIS, ROUTINE W REFLEX MICROSCOPIC  TROPONIN I (HIGH SENSITIVITY)     EKG  My interpretation of EKG:    RADIOLOGY I have reviewed the xray personally and interpreted no evidence of any pneumonia   PROCEDURES:  Critical Care performed: Yes, see critical care procedure note(s)  .1-3 Lead EKG Interpretation  Performed by: Vanessa [place], MD Authorized by: Vanessa [place], MD     Interpretation: normal     ECG rate:  70   ECG rate assessment: normal     Rhythm: sinus rhythm     Ectopy: none     Conduction: normal   .Critical Care  Performed by: Vanessa [place], MD Authorized by: Vanessa [place], MD   Critical care provider statement:    Critical care time (minutes):  30   Critical care was necessary to treat or prevent imminent or life-threatening deterioration of the following conditions:  Respiratory  failure   Critical care was time spent personally by me on the following activities:  Development of treatment plan with patient or surrogate, discussions with consultants, evaluation of patient's response to treatment, examination of patient, ordering and review of laboratory studies, ordering and review of radiographic studies, ordering and performing treatments and interventions, pulse oximetry, re-evaluation of patient's condition and review of old charts    [place] ED: Medications  furosemide (LASIX) injection 40 mg (40 mg Intravenous Given 11/06/22 1456)     IMPRESSION / MDM / [place] / ED COURSE  I reviewed the triage vital  signs and the nursing notes.   Patient's presentation is most consistent with severe exacerbation of chronic illness.   Patient comes in with respiratory distress I suspect this is a severe exacerbation of patient's known COPD or CHF.  Patient is already been given DuoNebs, steroids.  We will try to help facilitate breathing with BiPAP given patient is DNR.  Patient's son is on his way here.  Labs ordered to evaluate for COVID, flu, hypercapnia.  No signs of allergic reaction.  COVID, flu are negative.  BNP is elevated.  Troponin is negative.  CMP shows slightly elevated creatinine.  Patient looks much more comfortable on BiPAP on reassessment.  I will discuss the hospital team for admission.  The patient is on the cardiac monitor to evaluate for evidence of arrhythmia and/or significant heart rate changes.      FINAL CLINICAL IMPRESSION(S) / ED DIAGNOSES   Final diagnoses:  Chronic obstructive pulmonary disease with acute exacerbation (HCC)  Acute respiratory failure with hypoxia and hypercapnia ([place])     Rx / DC Orders   ED Discharge Orders     None        Note:  This document was prepared using Dragon voice recognition software and may include unintentional dictation errors.   Vanessa [place], MD 11/06/22 [phone]

## 2022-11-06 NOTE — Assessment & Plan Note (Addendum)
Likely in the setting of hypercapnia with pCO2 elevated at 66.  - Continue BiPAP - N.p.o. - Hold home centrally acting medications including gabapentin, melatonin and mirtazapine

## 2022-11-06 NOTE — Assessment & Plan Note (Signed)
Patient has a history of grade 1 diastolic dysfunction presenting with elevated BNP of 198 compared to prior.  She does have lower extremity pitting edema, however in the setting of lymphedema, this is not a reliable marker.  Unable to assess JVD due to body habitus.  She has received Lasix in the ED but has not had substantial urine output yet.  - Echocardiogram ordered - S/p IV Lasix 40 mg once - Daily weights - Strict in and out

## 2022-11-06 NOTE — ED Triage Notes (Signed)
Pt brought in via [place] em[facility] w c/o sob x 24 hrs. Pt wheezing sao2 at home on 2.5L chronic, was given solumedrol and 3 duonebs PTA.

## 2022-11-06 NOTE — Telephone Encounter (Signed)
Patient's daughter, [name], called to state patient has refused to take the prednisone this morning that Dr. Tommi Rumps has prescribed for her.  [name] states patient's blood sugar was 95 this morning (fasting) and she did not sleep last night.  [name] states patient's blood sugar was 337 yesterday.  [name] states she would like for Dr. Caryl Bis to let her know what they should do.

## 2022-11-06 NOTE — ED Notes (Signed)
Bladder scan was 57m, reported to Dr BCharleen Kirks

## 2022-11-06 NOTE — H&P (Signed)
History and Physical    Patient: Jessica Erickson YPP:[id number] DOB: Apr 15, 1926 DOA: 11/06/2022 DOS: the patient was seen and examined on 11/06/2022 PCP: System, Provider Not In  Patient coming from: Home  Chief Complaint:  Chief Complaint  Patient presents with   Shortness of Breath   HPI: [name] is a 88 y.o. female with medical history significant of acoustic neuroma and deafness, asthma, CAD, COPD, CVA, type 2 diabetes who presents to the ED with complaints of shortness of breath. History obtained from chart review and family friend at bedside due to patients' AMS.   Per chart review, patient was recently hospitalized from 11/22 to 11/24 for UTI.  At that time, she was also treated with IV Lasix for pulmonary vascular congestion.  On 12/01, patient visited her PCP at which time she was experiencing increased cough and sputum production.  She was started on prednisone and doxycycline.  Due to inability to sleep with prednisone, patient had only completed 2-day course.  She has been taking the antibiotics.  Today, it seemed that her breathing had significantly worsened.  She was experiencing increased shortness of breath.  Patient's family friend at bedside states patient's breathing was very loud and rhonchorous.  In addition, patient was lethargic and this is unusual for [name].  At the time, patient did not complain of any chest pain or palpitations.    I discussed with patient's daughter via telephone, who states patient has chronic lymphedema of bilateral lower extremities and usually has lower extremity swelling.  She believes that patient does have a history of heart failure but denies any prior history of exacerbations.  No known history of sleep apnea and patient does not wear CPAP.  ED course: On arrival to the ED, patient was normotensive at 133/61 with heart rate of 69.  Patient required 8 L to maintain oxygen saturation above 92%.  She was afebrile at 97.9.  VBG was  obtained that showed pCO2 of 66. Initial workup remarkable for potassium of 3.3, creatinine of 1.47, BNP of 198.  TRH consulted for admission for acute hypoxic respiratory failure.  Review of Systems: As mentioned in the history of present illness. All other systems reviewed and are negative.  Past Medical History:  Diagnosis Date   Acoustic neuroma (HCC)    Allergy    Asthma    Bilateral swelling of feet    and legs   Bladder infection    CAD (coronary artery disease)    Cataract    Change in voice    Compression fracture of body of thoracic vertebra (HCC)    T12 09/18/15 MRI s/p fall    Constipation    COPD (chronic obstructive pulmonary disease) (HCC)    previous CXR with chronic interstitial lung dz    CVA (cerebral vascular accident) ([place])    Depression    Diabetes ([place])    with neuropathy   Diabetes mellitus, type 2 ([place])    Diarrhea    Double vision    DVT (deep venous thrombosis) ([place])    right leg 10/2015 was on coumadin off as of 2017/2018 ; s/p IVC filter   Enuresis    Eye pain, right    Fall    Fatty liver    09/15/15 also mildly dilated pancreatitic duct rec MRCP small sub cm cyst hemangioma speeln mild right hydronephrorossi and prox. hydroureter, kidney stones, mild scarring kidneys   Female stress incontinence    Flank pain    GERD (  gastroesophageal reflux disease)    with small hiatal hernia    Hard of hearing    Heart disease    History of kidney problems    Hyperlipidemia    mixed   Hypertension    Hypothyroidism, postsurgical    Impaired mobility and ADLs    uses rolling walker has caretaker 24/7 at home   Leg edema    Mixed incontinence urge and stress (female)(female)    Neuropathy    Osteoarthritis    DDD spine    Osteoporosis with fracture    T12 compression fracture   Photophobia    Pulmonary embolism ([place])    10/2015 off coumadin as of 04/2016   Pulmonary HTN (HCC)    mild pulm HTN, echo 10/09/15 EF 27-78%EUMPN 1 dd, RV systolic pressure  increased    Recurrent UTI    Sinus pressure    Skin cancer    BCC jawline and scalp    Thyroid disease    follows [facility]   TIA (transient ischemic attack)    MRI 2009/2010 neg stroke    Trigeminal neuralgia    Dr. Tomi Bamberger s/p gamma knife x 2, on Tegretol since 2011/2012 no increase in dose >200 mg bid rec per family per neurology    Urinary frequency    Urinary, incontinence, stress female    Dr Erlene Quan urology    Past Surgical History:  Procedure Laterality Date   APPENDECTOMY     as a child, open   BRAIN SURGERY     schwnnoma removal 1996    brain tumor surgery     BREAST SURGERY     breast bx   CATARACT EXTRACTION     CHOLECYSTECTOMY     EYE SURGERY     cataract   IVC FILTER PLACEMENT ([place] [facility])     Dr. Lucky Cowboy 10/2015    LAPAROSCOPIC TUBAL LIGATION     MOHS SURGERY     scalp 04/2014    PERIPHERAL VASCULAR CATHETERIZATION N/A 10/11/2015   Procedure: IVC Filter Insertion;  Surgeon: Algernon Huxley, MD;  Location: [facility];  Service: Cardiovascular;  Laterality: N/A;   PERIPHERAL VASCULAR THROMBECTOMY Bilateral 03/29/2018   Procedure: PERIPHERAL VASCULAR THROMBECTOMY;  Surgeon: Algernon Huxley, MD;  Location: [facility];  Service: Cardiovascular;  Laterality: Bilateral;   PUBOVAGINAL SLING     THROAT SURGERY     THYROID SURGERY     tumor around vocal cords    TOOTH EXTRACTION     winter 2018    TOTAL THYROIDECTOMY  1976   Social History:  reports that she quit smoking about 27 years ago. Her smoking use included cigarettes. She has a 10.00 pack-year smoking history. She has never used smokeless tobacco. She reports that she does not drink alcohol and does not use drugs.  Allergies  Allergen Reactions   Penicillins Shortness Of Breath, Rash and Other (See Comments)    Has patient had a PCN reaction causing immediate rash, facial/tongue/throat swelling, SOB or lightheadedness with hypotension: Yes Has patient had a PCN reaction causing severe rash  involving mucus membranes or skin necrosis: No Has patient had a PCN reaction that required hospitalization No Has patient had a PCN reaction occurring within the last 10 years: No If all of the above answers are "NO", then may proceed with Cephalosporin use.   Sulfa Antibiotics Shortness Of Breath, Rash and Other (See Comments)   Amitiza [Lubiprostone]     N/v/d  Aspirin Other (See Comments)    Reaction:  Unknown  Other reaction(s): Bleeding (intolerance) Per patient " causes nose to bleed" Can take 81 mg daily without any complications Other reaction(s): "bloody nose"    Penicillin G Other (See Comments)    Has patient had a PCN reaction causing immediate rash, facial/tongue/throat swelling, SOB or lightheadedness with hypotension: No Has patient had a PCN reaction causing severe rash involving mucus membranes or skin necrosis: Unknown Has patient had a PCN reaction that required hospitalization: Unknown Has patient had a PCN reaction occurring within the last 10 years: Unknown If all of the above answers are "NO", then may proceed with Cephalosporin use.    Family History  Problem Relation Age of Onset   Heart disease Mother    Diabetes Father    Cancer Daughter        breast ca x 2 s/p mastectomy     Prior to Admission medications   Medication Sig Start Date End Date Taking? Authorizing Provider  Accu-Chek Softclix Lancets lancets Use as instructed 10/11/22   Dutch Quint B, FNP  acetaminophen (TYLENOL) 500 MG tablet Take 2 tablets (1,000 mg total) by mouth 2 (two) times daily as needed. 09/29/21   McLean-Scocuzza, Nino Glow, MD  albuterol (PROVENTIL) (2.5 MG/3ML) 0.083% nebulizer solution Take 3 mLs (2.5 mg total) by nebulization in the morning, at noon, and at bedtime. 01/25/22   McLean-Scocuzza, Nino Glow, MD  apixaban (ELIQUIS) 2.5 MG TABS tablet Take 1 tablet (2.5 mg total) by mouth 2 (two) times daily. 01/25/22   McLean-Scocuzza, Nino Glow, MD  atorvastatin (LIPITOR) 40 MG  tablet Take 1 tablet (40 mg total) by mouth daily at 6 PM. Generic ok 01/25/22   McLean-Scocuzza, Nino Glow, MD  Blood Glucose Monitoring Suppl (ACCU-CHEK AVIVA PLUS) w/Device KIT Use as directed 09/22/21   McLean-Scocuzza, Nino Glow, MD  calcium-vitamin D (OSCAL WITH D) 500-5 MG-MCG tablet Take 1 tablet by mouth 2 (two) times daily. Unable to find former dose 600ca/400 vitamin D inform patient dose change [place] 08/23/22   McLean-Scocuzza, Nino Glow, MD  cetirizine (ZYRTEC) 10 MG tablet Take 1 tablet (10 mg total) by mouth daily. 01/25/22   McLean-Scocuzza, Nino Glow, MD  doxycycline (VIBRA-TABS) 100 MG tablet Take 1 tablet (100 mg total) by mouth 2 (two) times daily. 11/03/22   Leone Haven, MD  feeding supplement, GLUCERNA SHAKE, (GLUCERNA SHAKE) LIQD Take 237 mLs by mouth 2 (two) times daily between meals. 08/04/21   Danford, Suann Larry, MD  Fluticasone-Umeclidin-Vilant (TRELEGY ELLIPTA) 100-62.5-25 MCG/ACT AEPB INHALE ONE PUFF DAILY. RINSE MOUTH AFTERUSE. STOP [place] 04/19/22   McLean-Scocuzza, Nino Glow, MD  furosemide (LASIX) 40 MG tablet TAKE ONE TABLET (40MG) DAILY. MAY TAKE ASECOND DOSE OF 40MG IF NEEDED AT LUNCH FOR 5 DAYS 08/02/22   McLean-Scocuzza, Nino Glow, MD  gabapentin (NEURONTIN) 100 MG capsule TAKE 2 CAPSULES BY MOUTH 3 TIMES DAILY. 01/25/22   McLean-Scocuzza, Nino Glow, MD  glucose blood test strip Use to test blood sugar twice daily. 10/11/22   Kennyth Arnold, FNP  guaiFENesin (MUCINEX) 600 MG 12 hr tablet Take 2 tablets (1,200 mg total) by mouth 2 (two) times daily as needed for cough or to loosen phlegm. 05/05/21   Chesley Mires, MD  ipratropium-albuterol (DUONEB) 0.5-2.5 (3) MG/3ML SOLN Take 3 mLs by nebulization every 4 (four) hours as needed. 01/10/22   McLean-Scocuzza, Nino Glow, MD  levothyroxine (SYNTHROID) 175 MCG tablet Take 1 tablet (175 mcg total) by mouth  daily before breakfast. D/c 200 mcg dose Do not take with other medications or vitamins 08/01/22   McLean-Scocuzza, Nino Glow,  MD  Lidocaine-Menthol (ICY HOT MAX LIDOCAINE) 4-1 % CREA Apply 1 application. topically 3 (three) times daily with meals as needed. Dispense cream or spray pt preference 02/16/22   McLean-Scocuzza, Nino Glow, MD  Melatonin 10 MG TABS Take 10 mg by mouth at bedtime. 11/07/21   McLean-Scocuzza, Nino Glow, MD  mirtazapine (REMERON) 15 MG tablet Take 1 tablet (15 mg total) by mouth at bedtime. 01/25/22   McLean-Scocuzza, Nino Glow, MD  Multiple Vitamin (MULTIVITAMIN WITH MINERALS) TABS tablet Take 1 tablet by mouth daily.     [provider]  pantoprazole (PROTONIX) 40 MG tablet Take 1 tablet (40 mg total) by mouth daily. 30 minutes before lunch or dinner 01/25/22   McLean-Scocuzza, Nino Glow, MD  polyethylene glycol powder (GLYCOLAX/MIRALAX) 17 GM/SCOOP powder Take 17 g by mouth daily. 04/06/20   McLean-Scocuzza, Nino Glow, MD  polyvinyl alcohol (LIQUIFILM TEARS) 1.4 % ophthalmic solution Place 1 drop into both eyes at bedtime. 08/04/21   Danford, Suann Larry, MD  predniSONE (DELTASONE) 20 MG tablet Take 2 tablets (40 mg total) by mouth daily with breakfast. 11/03/22   Leone Haven, MD    Physical Exam: Vitals:   11/06/22 1337 11/06/22 1338 11/06/22 1620 11/06/22 1800  BP:    123/64  Pulse:  72 71 70  Resp:    (!) 21  Temp:    97.8 F (36.6 C)  TempSrc:      SpO2:  100% 97% 98%  Weight: 92 kg     Height: _0  (1.702 m)      Physical Exam Vitals and nursing note reviewed.  Constitutional:      Appearance: She is overweight.     Comments: Patient opens eyes to physical stimuli  HENT:     Head: Normocephalic and atraumatic.  Eyes:     Pupils: Pupils are equal, round, and reactive to light.  Neck:     Comments: Unable to assess for JVD due to body habitus and position Cardiovascular:     Rate and Rhythm: Normal rate and regular rhythm.     Heart sounds: No murmur heard. Pulmonary:     Effort: Tachypnea present.     Breath sounds: No decreased breath sounds, wheezing, rhonchi or rales.      Comments: Due to patient's altered mental status, only able to assess anterior lung fields Abdominal:     Palpations: Abdomen is soft.     Tenderness: There is no abdominal tenderness. There is no guarding.  Musculoskeletal:     Cervical back: Neck supple.     Right lower leg: 2+ Pitting Edema present.     Left lower leg: 2+ Pitting Edema present.  Neurological:     Mental Status: She is lethargic.     Comments: Patient extremely lethargic, but opens eyes to physical touch.  Quickly falls back    Data Reviewed: CBC with WBC of 7.6, hemoglobin of 12.0, and platelets of 170.CMP with potassium of 3.3, creatinine of 1.47, BUN of 19, glucose of 144, albumin of 3.4 and GFR of 33.  Troponins negative at 13.  VBG with elevated pCO2 at 66.  BNP elevated at 198.  EKG personally reviewed.  Sinus rhythm with no acute ST or T wave changes.  DG Chest Portable 1 View  Result Date: 11/06/2022 CLINICAL DATA:  Shortness of breath. EXAM: PORTABLE CHEST 1 VIEW  COMPARISON:  11/03/2022 FINDINGS: Low lung volumes bilaterally. Again noted is mild elevation of the right hemidiaphragm. Linear density at left lung base is most likely related to scarring. Coarse lung markings appear unchanged. Heart size is grossly stable. IMPRESSION: Low lung volumes with chronic changes.  No acute findings. Electronically Signed   By: Markus Daft M.D.   On: 11/06/2022 14:02    Results are pending, will review when available.  Assessment and Plan: * Acute respiratory failure with hypoxia and hypercapnia (HCC) Patient presenting with several day history of increased cough and sputum production with 1 day history of increased shortness of breath.  She is normally on 2.5 L at baseline, however currently requiring BiPAP.  I suspect this is multifactorial, including COPD exacerbation and hypervolemia.  Patient also has a history of pulmonary hypertension that may be playing a role.  - Continue BiPAP for increased work of  breathing - Wean as tolerated - Management of COPD and CHF as noted below - Echocardiogram ordered  COPD with acute exacerbation ([place]) Patient with several day history of increased cough and sputum production with 1 day history of shortness of breath.  No wheezing on examination at this time, however patient has received numerous DuoNeb treatments.  - Start DuoNebs every 6 hours - S/p Solu-Medrol 125 mg - Restart prednisone 40 mg daily tomorrow - Start ceftriaxone and azithromycin - Procalcitonin - RVP - If procalcitonin is negative, can consider discontinuing antibiotics  Acute on chronic diastolic CHF (congestive heart failure) ([place]) Patient has a history of grade 1 diastolic dysfunction presenting with elevated BNP of 198 compared to prior.  She does have lower extremity pitting edema, however in the setting of lymphedema, this is not a reliable marker.  Unable to assess JVD due to body habitus.  She has received Lasix in the ED but has not had substantial urine output yet.  - Echocardiogram ordered - S/p IV Lasix 40 mg once - Daily weights - Strict in and out  Acute encephalopathy Likely in the setting of hypercapnia with pCO2 elevated at 66.  - Continue BiPAP - N.p.o. - Hold home centrally acting medications including gabapentin, melatonin and mirtazapine  Type 2 diabetes mellitus with stage 3b chronic kidney disease, without long-term current use of insulin (HCC) Renal function stable with creatinine mildly above baseline, but not elevated enough to meet AKI.  - SSI  Advance Care Planning:   Code Status: DNR   Consults: None  Family Communication: Patient's family friend updated in person and patient's daughter updated via telephone  Severity of Illness: The appropriate patient status for this patient is OBSERVATION. Observation status is judged to be reasonable and necessary in order to provide the required intensity of service to ensure the patient's safety. The  patient's presenting symptoms, physical exam findings, and initial radiographic and laboratory data in the context of their medical condition is felt to place them at decreased risk for further clinical deterioration. Furthermore, it is anticipated that the patient will be medically stable for discharge from the hospital within 2 midnights of admission.   Author: Jose Persia, MD 11/06/2022 7:19 PM  For on call review [email or web address].

## 2022-11-06 NOTE — Progress Notes (Signed)
Patient seen along with medical student Zada Zhong.  I personally evaluated this patient along with the student, and verified all aspects of the history, physical exam, and medical decision making as documented by the student.  I agree with the student's documentation and have made all necessary edits.  Fransheska Willingham, MD  

## 2022-11-06 NOTE — Assessment & Plan Note (Addendum)
Renal function stable with creatinine mildly above baseline, but not elevated enough to meet AKI.  - SSI

## 2022-11-07 ENCOUNTER — Observation Stay
Admit: 2022-11-07 | Discharge: 2022-11-07 | Disposition: A | Discharge status: Home / Self Care | Payer: Medicare PPO | Attending: Internal Medicine | Admitting: Internal Medicine

## 2022-11-07 ENCOUNTER — Encounter: Payer: Self-pay | Admitting: *Deleted

## 2022-11-07 DIAGNOSIS — J9601 Acute respiratory failure with hypoxia: Secondary | ICD-10-CM | POA: Diagnosis not present

## 2022-11-07 DIAGNOSIS — J9602 Acute respiratory failure with hypercapnia: Secondary | ICD-10-CM | POA: Diagnosis not present

## 2022-11-07 LAB — BASIC METABOLIC PANEL
Anion gap: 8 (ref 5–15)
BUN: 21 mg/dL (ref 8–23)
CO2: 33 mmol/L — ABNORMAL HIGH (ref 22–32)
Calcium: 8.1 mg/dL — ABNORMAL LOW (ref 8.9–10.3)
Chloride: 104 mmol/L (ref 98–111)
Creatinine, Ser: 1.35 mg/dL — ABNORMAL HIGH (ref 0.44–1.00)
GFR, Estimated: 36 mL/min — ABNORMAL LOW (ref >60)
Glucose, Bld: 134 mg/dL — ABNORMAL HIGH (ref 70–99)
Potassium: 4 mmol/L (ref 3.5–5.1)
Sodium: 145 mmol/L (ref 135–145)

## 2022-11-07 LAB — CBC WITH DIFFERENTIAL/PLATELET
Abs Immature Granulocytes: 0.04 10*3/uL (ref 0.00–0.07)
Basophils Absolute: 0 10*3/uL (ref 0.0–0.1)
Basophils Relative: 0 %
Eosinophils Absolute: 0 10*3/uL (ref 0.0–0.5)
Eosinophils Relative: 0 %
HCT: 35.9 % — ABNORMAL LOW (ref 36.0–46.0)
Hemoglobin: 11.4 g/dL — ABNORMAL LOW (ref 12.0–15.0)
Immature Granulocytes: 1 %
Lymphocytes Relative: 17 %
Lymphs Abs: 0.8 10*3/uL (ref 0.7–4.0)
MCH: 30.9 pg (ref 26.0–34.0)
MCHC: 31.8 g/dL (ref 30.0–36.0)
MCV: 97.3 fL (ref 80.0–100.0)
Monocytes Absolute: 0.5 10*3/uL (ref 0.1–1.0)
Monocytes Relative: 10 %
Neutro Abs: 3.4 10*3/uL (ref 1.7–7.7)
Neutrophils Relative %: 72 %
Platelets: 151 10*3/uL (ref 150–400)
RBC: 3.69 MIL/uL — ABNORMAL LOW (ref 3.87–5.11)
RDW: 13.1 % (ref 11.5–15.5)
WBC: 4.8 10*3/uL (ref 4.0–10.5)
nRBC: 0 % (ref 0.0–0.2)

## 2022-11-07 LAB — RESPIRATORY PANEL BY PCR

## 2022-11-07 LAB — CBG MONITORING, ED: Glucose-Capillary: 119 mg/dL — ABNORMAL HIGH (ref 70–99)

## 2022-11-07 MED ORDER — AZITHROMYCIN 250 MG PO TABS: ORAL_TABLET | ORAL | 0 refills | Status: AC

## 2022-11-07 MED ORDER — MELATONIN 5 MG PO TABS
10.0000 mg | ORAL_TABLET | Freq: Every day | ORAL | Status: DC
  Administered 2022-11-07: 10 mg via ORAL
  Filled 2022-11-07: qty 2

## 2022-11-07 MED ORDER — POLYETHYLENE GLYCOL 3350 17 GM/SCOOP PO POWD
17.0000 g | Freq: Every day | ORAL | Status: DC
  Filled 2022-11-07: qty 255

## 2022-11-07 MED ORDER — OYSTER SHELL CALCIUM/D3 500-5 MG-MCG PO TABS
1.0000 | ORAL_TABLET | Freq: Two times a day (BID) | ORAL | Status: DC
  Administered 2022-11-07: 1 via ORAL
  Filled 2022-11-07: qty 1

## 2022-11-07 NOTE — ED Notes (Signed)
This nurse has spoken to family about the situation of being discharged. This nurse has also gone back and forth with hospitalist and social work. They both agree that the pt can be discharged even thought there is no home health set up due pt being RSV positive.

## 2022-11-07 NOTE — Hospital Course (Addendum)
Taken from H&P.   Jessica Erickson is a 88 y.o. female with medical history significant of acoustic neuroma and deafness, asthma, CAD, COPD, CVA, type 2 diabetes who presents to the ED with complaints of shortness of breath. History obtained from chart review and family friend at bedside due to patients' AMS.   Per chart review, patient was recently hospitalized from 11/22 to 11/24 for UTI. At that time, she was also treated with IV Lasix for pulmonary vascular congestion. On 12/01, patient visited her PCP at which time she was experiencing increased cough and sputum production. She was started on prednisone and doxycycline. Due to inability to sleep with prednisone, patient had only completed 2-day course. She has been taking the antibiotics. Today, it seemed that her breathing had significantly worsened. She was experiencing increased shortness of breath.  Patient has chronic lymphedema of bilateral lower extremities. Per daughter that patient does have an history of heart failure but denies any prior exacerbations.  ED course: On arrival to the ED, patient was normotensive at 133/61 with heart rate of 69.  Patient required 8 L to maintain oxygen saturation above 92%.  She was afebrile at 97.9.  VBG was obtained that showed pCO2 of 66. Initial workup remarkable for potassium of 3.3, creatinine of 1.47, BNP of 198.  She was placed on BiPAP. Chest x-ray negative for any acute abnormalities.  12/5: Hemodynamically stable and currently saturating well on 2 L of oxygen which is her baseline.  Respiratory viral panel positive for RSV, procalcitonin negative.  COVID and flu PCR negative.  Labs seems stable with CO2 of 33, BUN 21 and slight improvement in creatinine to 1.35.  Baseline seems to be between 1.1-1.3. When patient was seen today she would like to go home.  Alert and oriented and denies any pain.  Continue to have some mild cough. Family was very concerned about RSV status.  They were reassured that  this is a virus causing just common cold in immunocompetent people.  They can use a simple mask for droplet precautions to keep them safe.  There is no need for any isolation. Apparently her caregivers were also refusing to come back and they also need some education. Patient was given a course of Zithromax which will help decrease the inflammation in her lungs.  Chest x-ray was negative for any concern of pneumonia. She can use over-the-counter Robitussin DM, Tylenol or decongestant as needed. She should keep herself well-hydrated and follow-up with her primary care doctor for further recommendations  She will continue on current medications.  She was read only given a prednisone which she was not taking it as she was unable to sleep after taking 1 pill, she was advised to take it in the morning if she can as it will help her recover soon with history of underlying COPD.

## 2022-11-07 NOTE — TOC Transition Note (Signed)
Transition of Care [facility]) - CM/SW Discharge Note   Patient Details  Name: AVRIL BUSSER MRN: [id number] Date of Birth: [birth year]  Transition of Care [facility]) CM/SW Contact:  Shelbie Hutching, RN Phone Number: 11/07/2022, 2:59 PM   Clinical Narrative:    RNCM spoke with patient's sister [name] via phone.  [name] had concerns about the patient being medically stable for discharge home when she came in yesterday in such distress.  Explained that the patient is currently stable on [place] 2L baseline.  MD has determined patient does not meet criteria for admission, patient does also not meet criteria for SNF as PT is recommending outpatient PT.  Last admission patient was set up with home health.  It is difficult for family to get patient out of the home so home health with [facility] will be continued.  Merleen Nicely notified of patient discharge today and orders for RN added.     Final next level of care: Home w Home Health Services Barriers to Discharge: Barriers Resolved   Patient Goals and CMS Choice Patient states their goals for this hospitalization and ongoing recovery are:: son worried because caregivers will not come out while patient has RSV CMS Medicare.gov Compare Post Acute Care list provided to:: Patient Represenative (must comment) Choice offered to / list presented to : [place] / [place]  Discharge Placement                       Discharge Plan and Services   Discharge Planning Services: CM Consult            DME Arranged: N/A DME Agency: NA       HH Arranged: RN, PT, OT HH Agency: [facility] Date [place] Agency Contacted: 11/07/22 Time [place]: [id number] Representative spoke with at [place]: [place] ([place]) Interventions     Readmission Risk Interventions    08/15/2021    9:47 AM  Readmission Risk Prevention Plan  Transportation Screening Complete  Medication Review Press photographer) Complete  PCP or Specialist appointment  within 3-5 days of discharge Complete  HRI or [place] Complete  SW Recovery Care/Counseling Consult Complete  Palliative Care Screening Complete  Skilled Nursing Facility Complete

## 2022-11-07 NOTE — ED Notes (Signed)
Patient transferred to room 36. Patient placed on cardiac monitor and purewick replaced.

## 2022-11-07 NOTE — ED Notes (Signed)
P'ts was changed d/t wet brief and bed sheets, pt had leaked through her purwick, purwick was connected to suction, however appears to not have been working properly.  Clean/dry linens were applied to bed, pt placed in a clean/dry diaper, and chucks applied to bed. A new purwick was connected to suction and properly placed in the correct position. Pt given 3 additional warm blankets. Lights turned off for comfort and rest per pt's request.

## 2022-11-07 NOTE — TOC Initial Note (Signed)
Transition of Care [facility]) - Initial/Assessment Note    Patient Details  Name: Jessica Erickson MRN: [id number] Date of Birth: 12/31/1925  [facility]) CM/SW Contact:    Shelbie Hutching, RN Phone Number: 11/07/2022, 2:04 PM  Clinical Narrative:                 Patient is from home where she lives with her son, [name].  Patient has caregivers privately paid.  Patient is just under observation and is medically cleared for discharge home today.  Patient has RSV and her caregivers are not going to come and sit with her for 2 weeks because of the RSV.  Patient's son works he is worried about her going home.  He reports he can't afford to pay for a company to come out.  Explained to patient that the patient does not need to be admitted and she will be discharged today.  He would like for RNCM to speak with his sister, Olivia Mackie.  Told him it was okay for sister to call me.    Expected Discharge Plan: [place] Barriers to Discharge: Barriers Resolved   Patient Goals and CMS Choice Patient states their goals for this hospitalization and ongoing recovery are:: son worried because caregivers will not come out while patient has RSV      Expected Discharge Plan and Services Expected Discharge Plan: [place]   Discharge Planning Services: CM Consult   Living arrangements for the past 2 months: Single Family Home                 DME Arranged: N/A DME Agency: NA                  Prior Living Arrangements/Services Living arrangements for the past 2 months: Single Family Home Lives with:: Adult Children Patient language and need for interpreter reviewed:: No Do you feel safe going back to the place where you live?: Yes      Need for Family Participation in Patient Care: Yes (Comment) Care giver support system in place?: Yes (comment) Current home services: DME, Sitter (oxygen walker) Criminal Activity/Legal Involvement Pertinent to Current  Situation/Hospitalization: No - Comment as needed  Activities of Daily Living      Permission Sought/Granted Permission sought to share information with : Family Supports Permission granted to share information with : Yes, Verbal Permission Granted  Share Information with NAME: Raeven Pint     Permission granted to share info w Relationship: son  Permission granted to share info w Contact Information: [phone]  Emotional Assessment         Alcohol / Substance Use: Not Applicable Psych Involvement: No (comment)  Admission diagnosis:  Acute respiratory failure with hypoxia and hypercapnia ([place]) [J96.01, J96.02] Patient Active Problem List   Diagnosis Date Noted   Acute respiratory failure with hypoxia and hypercapnia ([place]) 11/06/2022   Acute encephalopathy 11/06/2022   COPD with acute exacerbation ([place]) 11/06/2022   Acute on chronic diastolic CHF (congestive heart failure) ([place]) 11/06/2022   UTI (urinary tract infection) 11/03/2022   FTT (failure to thrive) in adult 10/25/2022   Nausea and vomiting 10/24/2022   Dyslipidemia 10/24/2022   Hypothyroidism 10/24/2022   GERD without esophagitis 10/24/2022   Hearing loss 09/01/2022   DNR (do not resuscitate) 08/01/2022   Iron deficiency 07/26/2022   Abnormal gait 10/19/2021   At high risk for falls 10/14/2021   Spinal stenosis of lumbar region 09/30/2021  Pain in both lower extremities 09/30/2021   Osteoarthritis 09/30/2021   Lumbar and sacral arthritis 09/30/2021   Acute on chronic respiratory failure ([place]) 08/14/2021   Shortness of breath 08/13/2021   CKD (chronic kidney disease), stage III ([place]) 08/13/2021   History of CVA (cerebrovascular accident) 08/13/2021   Prediabetes 06/29/2021   Pulmonary artery hypertension ([place]) 05/03/2021   Stage 4 chronic kidney disease ([place]) 03/29/2021   Grade I diastolic dysfunction 30/16/0109   Obesity (BMI 30-39.9) 08/16/2020   Chronic kidney disease, stage 3b ([place]) 04/06/2020    Thrombocytopenia ([place]) 04/06/2020   Seborrheic keratoses 04/06/2020   Hip pain 09/30/2019   History of deep vein thrombosis (DVT) of lower extremity 09/30/2019   Arthritis 09/30/2019   Fall 03/11/2019   Epistaxis 12/11/2018   History of DVT (deep vein thrombosis) 11/12/2018   Bilateral lower extremity edema 06/04/2018   Hearing loss of both ears 06/04/2018   Actinic keratosis 06/04/2018   Bronchiectasis ([place]) 06/04/2018   Depression 05/17/2018   Dehydration 05/15/2018   Hypertension 05/03/2018   Lymphedema 05/02/2018   Iron deficiency anemia 05/02/2018   Physical deconditioning 05/02/2018   Drug interaction 05/02/2018   Supratherapeutic INR 05/02/2018   Presence of IVC filter 05/02/2018   Anemia 04/11/2018   Fatigue 04/11/2018   Moderate protein-calorie malnutrition ([place]) 03/22/2018   Orthostasis 03/05/2018   Dizziness 03/04/2018   COPD (chronic obstructive pulmonary disease) ([place]) 12/13/2017   Anxiety and depression 12/13/2017   Trigeminal neuralgia 12/13/2017   GERD (gastroesophageal reflux disease) 12/13/2017   Type 2 diabetes mellitus with stage 3b chronic kidney disease, without long-term current use of insulin ([place]) 12/13/2017   Constipation 12/13/2017   Insomnia 12/13/2017   Hypothyroid 12/13/2017   Acute lower UTI 10/08/2015   Low back pain 10/08/2015   Collapsed vertebra, not elsewhere classified, thoracic region, initial encounter for fracture ([place]) 09/20/2015   Basal cell carcinoma of scalp 04/06/2014   Fothergill's neuralgia 08/05/2013   Bladder infection, chronic 02/11/2013   Female genuine stress incontinence 02/11/2013   Incomplete bladder emptying 02/11/2013   Intrinsic sphincter deficiency 02/11/2013   Mixed incontinence 02/11/2013   Excessive urination at night 02/11/2013   Bladder retention 02/11/2013   FOM (frequency of micturition) 02/11/2013   Basal cell carcinoma of face 09/13/2011   PCP:  System, Provider Not In Pharmacy:   [place], [place] - [address] [place] [address] Phone: [phone] Fax: [phone]  [facility] ([facility] - [place], [place] - [address] [address] Phone: [phone] Fax: [phone]     Social Determinants of Health (SDOH) Interventions    Readmission Risk Interventions    08/15/2021    9:47 AM  Readmission Risk Prevention Plan  Transportation Screening Complete  Medication Review (RN Care Manager) Complete  PCP or Specialist appointment within 3-5 days of discharge Complete  HRI or Home Care Consult Complete  SW Recovery Care/Counseling Consult Complete  Palliative Care Screening Complete  Skilled Nursing Facility Complete

## 2022-11-07 NOTE — ED Notes (Signed)
Pt appears to be sleeping, observe even RR and unlabored, NAD noted, side rails up x2 for safety, plan of care ongoing, call light within reach, no further concerns as of present.  Daughter is at the bedside. VSS

## 2022-11-07 NOTE — Discharge Summary (Signed)
Physician Discharge Summary   Patient: Jessica Erickson MRN: [id number] DOB: 1926-09-03  Admit date:     11/06/2022  Discharge date: 11/07/22  Discharge Physician: Lorella Nimrod   PCP: System, Provider Not In   Recommendations at discharge:  Please obtain CBC and BMP in 1 week Follow-up with primary care provider  Discharge Diagnoses: Principal Problem:   Acute respiratory failure with hypoxia and hypercapnia ([place]) Active Problems:   COPD with acute exacerbation ([place])   Acute on chronic diastolic CHF (congestive heart failure) ([place])   Acute encephalopathy   Type 2 diabetes mellitus with stage 3b chronic kidney disease, without long-term current use of insulin [facility])   Hospital Course: Taken from H&P.   Jessica Erickson is a 88 y.o. female with medical history significant of acoustic neuroma and deafness, asthma, CAD, COPD, CVA, type 2 diabetes who presents to the ED with complaints of shortness of breath. History obtained from chart review and family friend at bedside due to patients' AMS.   Per chart review, patient was recently hospitalized from 11/22 to 11/24 for UTI. At that time, she was also treated with IV Lasix for pulmonary vascular congestion. On 12/01, patient visited her PCP at which time she was experiencing increased cough and sputum production. She was started on prednisone and doxycycline. Due to inability to sleep with prednisone, patient had only completed 2-day course. She has been taking the antibiotics. Today, it seemed that her breathing had significantly worsened. She was experiencing increased shortness of breath.  Patient has chronic lymphedema of bilateral lower extremities. Per daughter that patient does have an history of heart failure but denies any prior exacerbations.  ED course: On arrival to the ED, patient was normotensive at 133/61 with heart rate of 69.  Patient required 8 L to maintain oxygen saturation above 92%.  She was afebrile at 97.9.  VBG was  obtained that showed pCO2 of 66. Initial workup remarkable for potassium of 3.3, creatinine of 1.47, BNP of 198.  She was placed on BiPAP. Chest x-ray negative for any acute abnormalities.  12/5: Hemodynamically stable and currently saturating well on 2 L of oxygen which is her baseline.  Respiratory viral panel positive for RSV, procalcitonin negative.  COVID and flu PCR negative.  Labs seems stable with CO2 of 33, BUN 21 and slight improvement in creatinine to 1.35.  Baseline seems to be between 1.1-1.3. When patient was seen today she would like to go home.  Alert and oriented and denies any pain.  Continue to have some mild cough. Family was very concerned about RSV status.  They were reassured that this is a virus causing just common cold in immunocompetent people.  They can use a simple mask for droplet precautions to keep them safe.  There is no need for any isolation. Apparently her caregivers were also refusing to come back and they also need some education. Patient was given a course of Zithromax which will help decrease the inflammation in her lungs.  Chest x-ray was negative for any concern of pneumonia. She can use over-the-counter Robitussin DM, Tylenol or decongestant as needed. She should keep herself well-hydrated and follow-up with her primary care doctor for further recommendations  She will continue on current medications.  She was read only given a prednisone which she was not taking it as she was unable to sleep after taking 1 pill, she was advised to take it in the morning if she can as it will help her recover soon  with history of underlying COPD.        Assessment and Plan: * Acute respiratory failure with hypoxia and hypercapnia (HCC) Patient presenting with several day history of increased cough and sputum production with 1 day history of increased shortness of breath.  She is normally on 2.5 L at baseline, however currently requiring BiPAP.  I suspect this is  multifactorial, including COPD exacerbation and hypervolemia.  Patient also has a history of pulmonary hypertension that may be playing a role.  - Continue BiPAP for increased work of breathing - Wean as tolerated - Management of COPD and CHF as noted below - Echocardiogram ordered  COPD with acute exacerbation ([place]) Patient with several day history of increased cough and sputum production with 1 day history of shortness of breath.  No wheezing on examination at this time, however patient has received numerous DuoNeb treatments.  - Start DuoNebs every 6 hours - S/p Solu-Medrol 125 mg - Restart prednisone 40 mg daily tomorrow - Start ceftriaxone and azithromycin - Procalcitonin - RVP - If procalcitonin is negative, can consider discontinuing antibiotics  Acute on chronic diastolic CHF (congestive heart failure) ([place]) Patient has a history of grade 1 diastolic dysfunction presenting with elevated BNP of 198 compared to prior.  She does have lower extremity pitting edema, however in the setting of lymphedema, this is not a reliable marker.  Unable to assess JVD due to body habitus.  She has received Lasix in the ED but has not had substantial urine output yet.  - Echocardiogram ordered - S/p IV Lasix 40 mg once - Daily weights - Strict in and out  Acute encephalopathy Likely in the setting of hypercapnia with pCO2 elevated at 66.  - Continue BiPAP - N.p.o. - Hold home centrally acting medications including gabapentin, melatonin and mirtazapine  Type 2 diabetes mellitus with stage 3b chronic kidney disease, without long-term current use of insulin (HCC) Renal function stable with creatinine mildly above baseline, but not elevated enough to meet AKI.  - SSI   Consultants: None Procedures performed: None Disposition: Home Diet recommendation:  Discharge Diet Orders (From admission, onward)     Start     Ordered   11/07/22 0000  Diet - low sodium heart healthy        11/07/22  1445           Cardiac diet DISCHARGE MEDICATION: Allergies as of 11/07/2022       Reactions   Penicillins Shortness Of Breath, Rash, Other (See Comments)   Has patient had a PCN reaction causing immediate rash, facial/tongue/throat swelling, SOB or lightheadedness with hypotension: Yes Has patient had a PCN reaction causing severe rash involving mucus membranes or skin necrosis: No Has patient had a PCN reaction that required hospitalization No Has patient had a PCN reaction occurring within the last 10 years: No If all of the above answers are "NO", then may proceed with Cephalosporin use.   Sulfa Antibiotics Shortness Of Breath, Rash, Other (See Comments)   Amitiza [lubiprostone]    N/v/d   Aspirin Other (See Comments)   Reaction:  Unknown  Other reaction(s): Bleeding (intolerance) Per patient " causes nose to bleed" Can take 81 mg daily without any complications Other reaction(s): "bloody nose"   Penicillin G Other (See Comments)   Has patient had a PCN reaction causing immediate rash, facial/tongue/throat swelling, SOB or lightheadedness with hypotension: No Has patient had a PCN reaction causing severe rash involving mucus membranes or skin necrosis: Unknown Has patient  had a PCN reaction that required hospitalization: Unknown Has patient had a PCN reaction occurring within the last 10 years: Unknown If all of the above answers are "NO", then may proceed with Cephalosporin use.        Medication List     STOP taking these medications    doxycycline 100 MG tablet Commonly known as: VIBRA-TABS       TAKE these medications    Accu-Chek Aviva Plus w/Device Kit Use as directed   Accu-Chek Softclix Lancets lancets Use as instructed   acetaminophen 500 MG tablet Commonly known as: TYLENOL Take 2 tablets (1,000 mg total) by mouth 2 (two) times daily as needed.   albuterol (2.5 MG/3ML) 0.083% nebulizer solution Commonly known as: PROVENTIL Take 3 mLs (2.5 mg  total) by nebulization in the morning, at noon, and at bedtime.   apixaban 2.5 MG Tabs tablet Commonly known as: Eliquis Take 1 tablet (2.5 mg total) by mouth 2 (two) times daily.   atorvastatin 40 MG tablet Commonly known as: LIPITOR Take 1 tablet (40 mg total) by mouth daily at 6 PM. Generic ok   azithromycin 250 MG tablet Commonly known as: Zithromax Z-Pak Take 2 tablets (500 mg) on  Day 1,  followed by 1 tablet (250 mg) once daily on Days 2 through 5.   bisacodyl 5 MG EC tablet Commonly known as: DULCOLAX Take 5 mg by mouth daily as needed for moderate constipation.   calcium-vitamin D 500-5 MG-MCG tablet Commonly known as: OSCAL WITH D Take 1 tablet by mouth 2 (two) times daily. Unable to find former dose 600ca/400 vitamin D inform patient dose change Oscal   cetirizine 10 MG tablet Commonly known as: ZYRTEC Take 1 tablet (10 mg total) by mouth daily.   feeding supplement (GLUCERNA SHAKE) Liqd Take 237 mLs by mouth 2 (two) times daily between meals.   furosemide 40 MG tablet Commonly known as: LASIX TAKE ONE TABLET (40MG) DAILY. MAY TAKE ASECOND DOSE OF 40MG IF NEEDED AT LUNCH FOR 5 DAYS   gabapentin 100 MG capsule Commonly known as: NEURONTIN TAKE 2 CAPSULES BY MOUTH 3 TIMES DAILY. What changed:  how much to take how to take this when to take this   glucose blood test strip Use to test blood sugar twice daily.   guaiFENesin 600 MG 12 hr tablet Commonly known as: MUCINEX Take 2 tablets (1,200 mg total) by mouth 2 (two) times daily as needed for cough or to loosen phlegm. What changed: how much to take   [facility] Max Lidocaine 4-1 % Crea Generic drug: Lidocaine-Menthol Apply 1 application. topically 3 (three) times daily with meals as needed. Dispense cream or spray pt preference   ipratropium-albuterol 0.5-2.5 (3) MG/3ML Soln Commonly known as: DUONEB Take 3 mLs by nebulization every 4 (four) hours as needed.   levothyroxine 175 MCG tablet Commonly known  as: SYNTHROID Take 1 tablet (175 mcg total) by mouth daily before breakfast. D/c 200 mcg dose Do not take with other medications or vitamins   Melatonin 10 MG Tabs Take 10 mg by mouth at bedtime.   mirtazapine 15 MG tablet Commonly known as: Remeron Take 1 tablet (15 mg total) by mouth at bedtime.   multivitamin with minerals Tabs tablet Take 1 tablet by mouth daily.   pantoprazole 40 MG tablet Commonly known as: PROTONIX Take 1 tablet (40 mg total) by mouth daily. 30 minutes before lunch or dinner   polyethylene glycol powder 17 GM/SCOOP powder Commonly known as:  GLYCOLAX/MIRALAX Take 17 g by mouth daily.   polyvinyl alcohol 1.4 % ophthalmic solution Commonly known as: LIQUIFILM TEARS Place 1 drop into both eyes at bedtime.   predniSONE 20 MG tablet Commonly known as: DELTASONE Take 2 tablets (40 mg total) by mouth daily with breakfast.   Trelegy Ellipta 100-62.5-25 MCG/ACT Aepb Generic drug: Fluticasone-Umeclidin-Vilant INHALE ONE PUFF DAILY. RINSE MOUTH [place]        Discharge Exam: Filed Weights   11/06/22 1337  Weight: 92 kg   General.  Frail elderly lady, in no acute distress. Pulmonary.  Lungs clear bilaterally, normal respiratory effort. CV.  Regular rate and rhythm, no JVD, rub or murmur. Abdomen.  Soft, nontender, nondistended, BS positive. CNS.  Alert and oriented .  No focal neurologic deficit. Extremities.  1+ LE edema bilaterally with lymphedema, pulses intact and symmetrical. Psychiatry.  Judgment and insight appears normal.   Condition at discharge: stable  The results of significant diagnostics from this hospitalization (including imaging, microbiology, ancillary and laboratory) are listed below for reference.   Imaging Studies: DG Chest Portable 1 View  Result Date: 11/06/2022 CLINICAL DATA:  Shortness of breath. EXAM: PORTABLE CHEST 1 VIEW COMPARISON:  11/03/2022 FINDINGS: Low lung volumes bilaterally. Again noted  is mild elevation of the right hemidiaphragm. Linear density at left lung base is most likely related to scarring. Coarse lung markings appear unchanged. Heart size is grossly stable. IMPRESSION: Low lung volumes with chronic changes.  No acute findings. Electronically Signed   By: Markus Daft M.D.   On: 11/06/2022 14:02   DG Chest 2 View  Result Date: 11/06/2022 CLINICAL DATA:  History of COPD and cough. EXAM: CHEST - 2 VIEW COMPARISON:  October 25, 2022 FINDINGS: The heart size and mediastinal contours are within normal limits. Low lung volumes are noted. Diffuse, chronic appearing increased interstitial lung markings are seen with mild, stable areas of bibasilar linear scarring and/or atelectasis. There is mild, stable elevation of the right hemidiaphragm. There is no evidence of a pleural effusion or pneumothorax. A radiopaque inferior vena cava filter is noted. A compression fracture deformity and subsequent vertebroplasty is seen within the lower thoracic spine. IMPRESSION: Chronic appearing increased interstitial lung markings with mild, stable bibasilar linear scarring and/or atelectasis. Electronically Signed   By: Virgina Norfolk M.D.   On: 11/06/2022 01:05   DG Chest Port 1 View  Result Date: 10/25/2022 CLINICAL DATA:  Wheezing. EXAM: PORTABLE CHEST 1 VIEW COMPARISON:  August 13, 2021. FINDINGS: Stable cardiomediastinal silhouette. Mild central pulmonary vascular congestion is noted. Mild bibasilar subsegmental atelectasis is noted. Hypoinflation of the lungs is noted. Bony thorax is unremarkable. IMPRESSION: Mild central pulmonary vascular congestion. Hypoinflation of the lungs with mild bibasilar subsegmental atelectasis. Electronically Signed   By: Marijo Conception M.D.   On: 10/25/2022 10:55    Microbiology: Results for orders placed or performed during the hospital encounter of 11/06/22  Resp Panel by RT-PCR (Flu A&B, Covid) Anterior Nasal Swab     Status: None   Collection Time:  11/06/22  1:51 PM   Specimen: Anterior Nasal Swab  Result Value Ref Range Status   SARS Coronavirus 2 by RT PCR NEGATIVE NEGATIVE Final    Comment: (NOTE) SARS-CoV-2 target nucleic acids are NOT DETECTED.  The SARS-CoV-2 RNA is generally detectable in upper respiratory specimens during the acute phase of infection. The lowest concentration of SARS-CoV-2 viral copies this assay can detect is 138 copies/mL. A negative result does not preclude  SARS-Cov-2 infection and should not be used as the sole basis for treatment or other patient management decisions. A negative result may occur with  improper specimen collection/handling, submission of specimen other than nasopharyngeal swab, presence of viral mutation(s) within the areas targeted by this assay, and inadequate number of viral copies(<138 copies/mL). A negative result must be combined with clinical observations, patient history, and epidemiological information. The expected result is Negative.  Fact Sheet for Patients:  EntrepreneurPulse.com.au  Fact Sheet for Healthcare Providers:  IncredibleEmployment.be  This test is no t yet approved or cleared by the [place] FDA and  has been authorized for detection and/or diagnosis of SARS-CoV-2 by FDA under an Emergency Use Authorization (EUA). This EUA will remain  in effect (meaning this test can be used) for the duration of the COVID-19 declaration under Section 564(b)(1) of the Act, 21 U.S.C.section 360bbb-3(b)(1), unless the authorization is terminated  or revoked sooner.       Influenza A by PCR NEGATIVE NEGATIVE Final   Influenza B by PCR NEGATIVE NEGATIVE Final    Comment: (NOTE) The Xpert Xpress SARS-CoV-2/FLU/RSV plus assay is intended as an aid in the diagnosis of influenza from Nasopharyngeal swab specimens and should not be used as a sole basis for treatment. Nasal washings and aspirates are unacceptable for Xpert Xpress  SARS-CoV-2/FLU/RSV testing.  Fact Sheet for Patients: EntrepreneurPulse.com.au  Fact Sheet for Healthcare Providers: IncredibleEmployment.be  This test is not yet approved or cleared by the [place] FDA and has been authorized for detection and/or diagnosis of SARS-CoV-2 by FDA under an Emergency Use Authorization (EUA). This EUA will remain in effect (meaning this test can be used) for the duration of the COVID-19 declaration under Section 564(b)(1) of the Act, 21 U.S.C. section 360bbb-3(b)(1), unless the authorization is terminated or revoked.  Performed at [facility], [place], [place] [address]   Respiratory (~20 pathogens) panel by PCR     Status: Abnormal   Collection Time: 11/06/22  7:28 PM   Specimen: Nasopharyngeal Swab; Respiratory  Result Value Ref Range Status   Adenovirus NOT DETECTED NOT DETECTED Final   Coronavirus 229E NOT DETECTED NOT DETECTED Final    Comment: (NOTE) The Coronavirus on the Respiratory Panel, DOES NOT test for the novel  Coronavirus (2019 nCoV)    Coronavirus HKU1 NOT DETECTED NOT DETECTED Final   Coronavirus NL63 NOT DETECTED NOT DETECTED Final   Coronavirus OC43 NOT DETECTED NOT DETECTED Final   Metapneumovirus NOT DETECTED NOT DETECTED Final   Rhinovirus / Enterovirus NOT DETECTED NOT DETECTED Final   Influenza A NOT DETECTED NOT DETECTED Final   Influenza B NOT DETECTED NOT DETECTED Final   Parainfluenza Virus 1 NOT DETECTED NOT DETECTED Final   Parainfluenza Virus 2 NOT DETECTED NOT DETECTED Final   Parainfluenza Virus 3 NOT DETECTED NOT DETECTED Final   Parainfluenza Virus 4 NOT DETECTED NOT DETECTED Final   Respiratory Syncytial Virus DETECTED (A) NOT DETECTED Final   Bordetella pertussis NOT DETECTED NOT DETECTED Final   Bordetella Parapertussis NOT DETECTED NOT DETECTED Final   Chlamydophila pneumoniae NOT DETECTED NOT DETECTED Final   Mycoplasma pneumoniae NOT  DETECTED NOT DETECTED Final    Comment: Performed at [facility], [place]. [address], [place], [place] [address]    Labs: CBC: Recent Labs  Lab 11/06/22 1351 11/07/22 0534  WBC 7.6 4.8  NEUTROABS 4.3 3.4  HGB 12.0 11.4*  HCT 39.2 35.9*  MCV 98.2 97.3  PLT 170 151  Basic Metabolic Panel: Recent Labs  Lab 11/06/22 1351 11/07/22 0534  NA 143 145  K 3.3* 4.0  CL 101 104  CO2 32 33*  GLUCOSE 144* 134*  BUN 19 21  CREATININE 1.47* 1.35*  CALCIUM 8.3* 8.1*   Liver Function Tests: Recent Labs  Lab 11/06/22 1351  AST 27  ALT 16  ALKPHOS 53  BILITOT 0.7  PROT 6.5  ALBUMIN 3.4*   CBG: Recent Labs  Lab 11/07/22 1154  GLUCAP 119*    Discharge time spent: greater than 30 minutes.  This record has been created using Systems analyst. Errors have been sought and corrected,but may not always be located. Such creation errors do not reflect on the standard of care.   Signed: Lorella Nimrod, MD [facility] 11/07/2022

## 2022-11-07 NOTE — Evaluation (Signed)
Physical Therapy Evaluation Patient Details Name: Jessica Erickson MRN: [id number] DOB: 28-Nov-1926 Today's Date: 11/07/2022  History of Present Illness  Jessica Erickson is a 73yoF who comes to [facility] on 12/4 c SOBx 24hrs and AMS. PMH: acoustic neuroma and deafness, CAD, COPD, CVA, DM2. Pt admitted with ARF c hypoxia/hypercapnia and COPD exacerbation.  Clinical Impression  Pt reports feeling tired, otherwise denies feeling off baseline with rest of session. Pt is deaf as per chart, [name] uses a captioning app to fcacilitate communication. DTR provides some details on home setup and function. Pt requires no assistance to perform bed mobility and transfers, is able to take steps at bedside without LOB, distance limited by lines/leads/diaper etc. Pt sitting up at EOB at EOS eating a banana. Meds not yet received per pt/DTR. Pt looks appropriate for return to home today from a PT standpoint.      Recommendations for follow up therapy are one component of a multi-disciplinary discharge planning process, led by the attending physician.  Recommendations may be updated based on patient status, additional functional criteria and insurance authorization.  Follow Up Recommendations Outpatient PT (pt in not home bound)      Assistance Recommended at Discharge Set up Supervision/Assistance  Patient can return home with the following  Assistance with cooking/housework;Assist for transportation;Direct supervision/assist for medications management;Help with stairs or ramp for entrance;A little help with walking and/or transfers    Equipment Recommendations None recommended by PT  Recommendations for Other Services       Functional Status Assessment Patient has had a recent decline in their functional status and/or demonstrates limited ability to make significant improvements in function in a reasonable and predictable amount of time     Precautions / Restrictions Precautions Precautions:  Fall Restrictions Weight Bearing Restrictions: No      Mobility  Bed Mobility Overal bed mobility: Needs Assistance Bed Mobility: Supine to Sit     Supine to sit: Independent          Transfers Overall transfer level: Needs assistance Equipment used: Rolling walker (2 wheels) Transfers: Sit to/from Stand Sit to Stand: Modified independent (Device/Increase time)           General transfer comment: says she feels fine    Ambulation/Gait Ambulation/Gait assistance: Min guard Gait Distance (Feet): 12 Feet Assistive device: Rolling walker (2 wheels)         General Gait Details: alternates fwd//backward with RW on 2L, no LOB, reports to feel at baseline  Stairs            Wheelchair Mobility    Modified Rankin (Stroke Patients Only)       Balance                                             Pertinent Vitals/Pain Pain Assessment Pain Assessment: No/denies pain    Home Living Family/patient expects to be discharged to:: Private residence Living Arrangements: Children Available Help at Discharge: Family;Available 24 hours/day (paid caregivers,son, and neighbor) Type of Home: House Home Access: Stairs to enter;Ramped entrance Entrance Stairs-Rails: None Entrance Stairs-Number of Steps: 2 in foyer after ramp entry   Home Layout: One level Home Equipment: Conservation officer, nature (2 wheels);Wheelchair - manual Additional Comments: O2 at baseline, and "breathing machine"; sleeps in a regular queen size bed at night    Prior Function Prior Level of Function : Needs  assist                     Hand Dominance        Extremity/Trunk Assessment                Communication   Communication: Deaf  Cognition Arousal/Alertness: Awake/alert Behavior During Therapy: WFL for tasks assessed/performed Overall Cognitive Status: Within Functional Limits for tasks assessed                                 General  Comments: writtent communication        General Comments      Exercises     Assessment/Plan    PT Assessment Patient needs continued PT services  PT Problem List Decreased strength;Decreased balance;Decreased range of motion;Decreased mobility;Decreased activity tolerance       PT Treatment Interventions DME instruction;Functional mobility training;Balance training;Patient/family education;Gait training;Therapeutic activities;Neuromuscular re-education;Stair training;Therapeutic exercise    PT Goals (Current goals can be found in the Care Plan section)  Acute Rehab PT Goals Patient Stated Goal: to retur home PT Goal Formulation: With patient Time For Goal Achievement: 11/21/22 Potential to Achieve Goals: Good    Frequency Min 2X/week     Co-evaluation               AM-PAC PT "6 Clicks" Mobility  Outcome Measure Help needed turning from your back to your side while in a flat bed without using bedrails?: None Help needed moving from lying on your back to sitting on the side of a flat bed without using bedrails?: None Help needed moving to and from a bed to a chair (including a wheelchair)?: A Little Help needed standing up from a chair using your arms (e.g., wheelchair or bedside chair)?: A Little Help needed to walk in hospital room?: A Little Help needed climbing 3-5 steps with a railing? : A Lot 6 Click Score: 19    End of Session Equipment Utilized During Treatment: Oxygen Activity Tolerance: Patient tolerated treatment well;No increased pain Patient left: in bed;with family/visitor present;with call bell/phone within reach Nurse Communication: Mobility status PT Visit Diagnosis: Unsteadiness on feet (R26.81);Other abnormalities of gait and mobility (R26.89);Muscle weakness (generalized) (M62.81);Difficulty in walking, not elsewhere classified (R26.2)    Time: 6270-3500 PT Time Calculation (min) (ACUTE ONLY): 30 min   Charges:   PT Evaluation $PT Eval  Moderate Complexity: 1 Mod PT Treatments $Therapeutic Activity: 8-22 mins       11:01 AM, 11/07/22 Etta Grandchild, PT, DPT Physical Therapist - [facility]  [phone] ([place])    Derreon Consalvo C 11/07/2022, 10:59 AM

## 2022-11-07 NOTE — Evaluation (Signed)
Occupational Therapy Evaluation Patient Details Name: Jessica Erickson MRN: [id number] DOB: 1926/07/07 Today's Date: 11/07/2022   History of Present Illness Jessica Erickson is a 14yoF who comes to [facility] on 12/4 c SOBx 24hrs and AMS. PMH: acoustic neuroma and deafness, CAD, COPD, CVA, DM2. Pt admitted with ARF c hypoxia/hypercapnia and COPD exacerbation.   Clinical Impression   [name] was seen for OT evaluation this date. Prior to hospital admission, pt was MOD I for mobility and ADLs. Pt lives at home with 24/7 caregivers, however of note caregivers will not come out while pt has RSV. Pt presents to acute OT demonstrating impaired ADL performance and functional mobility 2/2 decreased activity tolerance. Written communication used. Pt currently requires MOD I don B shoes seated EOB. SUPERVISION + RW for toilet t/f, pericare,a nd hand washing standing sink side. Pt is near functional baseline, no skilled acute OT needs identified, will sign off. Upon hospital discharge, recommend outpatient OT.    Recommendations for follow up therapy are one component of a multi-disciplinary discharge planning process, led by the attending physician.  Recommendations may be updated based on patient status, additional functional criteria and insurance authorization.   Follow Up Recommendations  Outpatient OT     Assistance Recommended at Discharge Set up Supervision/Assistance  Patient can return home with the following A little help with bathing/dressing/bathroom;Assistance with cooking/housework    Functional Status Assessment  Patient has had a recent decline in their functional status and demonstrates the ability to make significant improvements in function in a reasonable and predictable amount of time.  Equipment Recommendations  None recommended by OT    Recommendations for Other Services       Precautions / Restrictions Precautions Precautions: Fall Restrictions Weight Bearing Restrictions: No       Mobility Bed Mobility Overal bed mobility: Needs Assistance Bed Mobility: Supine to Sit, Sit to Supine     Supine to sit: Modified independent (Device/Increase time) Sit to supine: Supervision   General bed mobility comments: elevated stretcher height    Transfers Overall transfer level: Modified independent Equipment used: Rolling walker (2 wheels)                      Balance Overall balance assessment: Needs assistance Sitting-balance support: No upper extremity supported, Feet supported Sitting balance-Leahy Scale: Normal     Standing balance support: No upper extremity supported, During functional activity Standing balance-Leahy Scale: Good Standing balance comment: reaches outisde BOS for grooming items                           ADL either performed or assessed with clinical judgement   ADL Overall ADL's : Needs assistance/impaired                                       General ADL Comments: MOD I don B shoes seated EOB. SUPERVISION + RW for toilet t/f, pericare,a nd hand washing standing sink side.      Pertinent Vitals/Pain Pain Assessment Pain Assessment: No/denies pain     Hand Dominance     Extremity/Trunk Assessment Upper Extremity Assessment Upper Extremity Assessment: Overall WFL for tasks assessed   Lower Extremity Assessment Lower Extremity Assessment: Generalized weakness       Communication Communication Communication: Deaf (typed communication)   Cognition Arousal/Alertness: Awake/alert Behavior During Therapy: WFL for  tasks assessed/performed Overall Cognitive Status: Within Functional Limits for tasks assessed                                                  Home Living Family/patient expects to be discharged to:: Private residence Living Arrangements: Children Available Help at Discharge: Family;Available 24 hours/day (paid caregivers,son, and neighbor) Type of Home:  House Home Access: Stairs to enter;Ramped entrance Entrance Stairs-Number of Steps: 2 in foyer after ramp entry Entrance Stairs-Rails: None Home Layout: One level               Home Equipment: Conservation officer, nature (2 wheels);Wheelchair - manual   Additional Comments: O2 at baseline, and "breathing machine"; sleeps in a regular queen size bed at night      Prior Functioning/Environment Prior Level of Function : Needs assist               ADLs Comments: paid caregivers 24/7 however will not come on d/c while pt has RSV        OT Problem List: Decreased activity tolerance         OT Goals(Current goals can be found in the care plan section) Acute Rehab OT Goals Patient Stated Goal: to go home OT Goal Formulation: With patient/family Time For Goal Achievement: 11/21/22 Potential to Achieve Goals: Good   AM-PAC OT "6 Clicks" Daily Activity     Outcome Measure Help from another person eating meals?: None Help from another person taking care of personal grooming?: None Help from another person toileting, which includes using toliet, bedpan, or urinal?: None Help from another person bathing (including washing, rinsing, drying)?: A Little Help from another person to put on and taking off regular upper body clothing?: None Help from another person to put on and taking off regular lower body clothing?: None 6 Click Score: 23   End of Session    Activity Tolerance: Patient tolerated treatment well Patient left: in bed;with call bell/phone within reach;with family/visitor present  OT Visit Diagnosis: Other abnormalities of gait and mobility (R26.89);Muscle weakness (generalized) (M62.81)                Time: 1791-5056 OT Time Calculation (min): 24 min Charges:  OT General Charges $OT Visit: 1 Visit OT Evaluation $OT Eval Low Complexity: 1 Low  Dessie Coma, M.S. OTR/L  11/07/22, 3:01 PM  ascom [phone]

## 2022-11-07 NOTE — Progress Notes (Signed)
*  PRELIMINARY RESULTS* Echocardiogram 2D Echocardiogram has been performed.  Jessica Erickson 11/07/2022, 2:43 PM

## 2022-11-07 NOTE — Telephone Encounter (Signed)
Spoke to Patient's son today

## 2022-11-08 LAB — ECHOCARDIOGRAM COMPLETE
AR max vel: 1.86 cm2
AV Area VTI: 2.15 cm2
AV Area mean vel: 1.9 cm2
AV Mean grad: 10 mmHg
AV Peak grad: 16.2 mmHg
Ao pk vel: 2.01 m/s
Area-P 1/2: 2.34 cm2
Height: 67 in
S' Lateral: 3.2 cm
Weight: 3245.17 oz

## 2022-11-09 ENCOUNTER — Encounter: Payer: Self-pay | Admitting: Podiatry

## 2022-11-09 ENCOUNTER — Ambulatory Visit (INDEPENDENT_AMBULATORY_CARE_PROVIDER_SITE_OTHER): Payer: Medicare PPO | Admitting: Podiatry

## 2022-11-09 VITALS — BP 136/74 | HR 75

## 2022-11-09 DIAGNOSIS — D689 Coagulation defect, unspecified: Secondary | ICD-10-CM

## 2022-11-09 DIAGNOSIS — N1832 Chronic kidney disease, stage 3b: Secondary | ICD-10-CM

## 2022-11-09 DIAGNOSIS — I890 Lymphedema, not elsewhere classified: Secondary | ICD-10-CM

## 2022-11-09 DIAGNOSIS — B351 Tinea unguium: Secondary | ICD-10-CM

## 2022-11-09 DIAGNOSIS — M79676 Pain in unspecified toe(s): Secondary | ICD-10-CM | POA: Diagnosis not present

## 2022-11-09 DIAGNOSIS — L84 Corns and callosities: Secondary | ICD-10-CM | POA: Diagnosis not present

## 2022-11-09 DIAGNOSIS — E1140 Type 2 diabetes mellitus with diabetic neuropathy, unspecified: Secondary | ICD-10-CM

## 2022-11-09 NOTE — Progress Notes (Signed)
This patient returns to my office for at risk foot care.  This patient requires this care by a professional since this patient will be at risk due to having history of DVT , coagulation defect and diabetes.  Patient is taking eliquis. This patient also has callus on her bunion right foot   This patient is unable to cut nails herself since the patient cannot reach her nails.These nails  and callus are painful walking and wearing shoes.  This patient presents for at risk foot care today.  General Appearance  Alert, conversant and in no acute stress.  Vascular  Dorsalis pedis and posterior tibial  pulses are weakly  palpable due to swelling  bilaterally.  Capillary return is within normal limits  bilaterally. Temperature is within normal limits  bilaterally.  Neurologic  Senn-Weinstein monofilament wire test within normal limits  bilaterally. Muscle power within normal limits bilaterally.  Nails Thick disfigured discolored nails with subungual debris  from hallux to fifth toes bilaterally. No evidence of bacterial infection or drainage bilaterally.  Orthopedic  No limitations of motion  feet .  No crepitus or effusions noted.  No bony pathology or digital deformities noted. HAV  Right.    Skin  normotropic skin with no porokeratosis noted bilaterally.  No signs of infections or ulcers noted.   Callus 1st MPJ right foot.  Onychomycosis  Pain in right toes  Pain in left toes  Callus right foot.  Consent was obtained for treatment procedures.   Mechanical debridement of nails 1-5  bilaterally performed with a nail nipper.  Filed with dremel without incident. Debridement of callus with dremel.   Return office visit   3 months                   Told patient to return for periodic foot care and evaluation due to potential at risk complications.   Myda Detwiler DPM  

## 2022-11-10 ENCOUNTER — Telehealth: Payer: Self-pay

## 2022-11-10 LAB — BLOOD GAS, VENOUS
Acid-Base Excess: 14 mmol/L — ABNORMAL HIGH (ref 0.0–2.0)
Bicarbonate: 41.8 mmol/L — ABNORMAL HIGH (ref 20.0–28.0)
O2 Saturation: 54.4 %
Patient temperature: 37
pCO2, Ven: 66 mmHg — ABNORMAL HIGH (ref 44–60)
pH, Ven: 7.41 (ref 7.25–7.43)
pO2, Ven: 40 mmHg (ref 32–45)

## 2022-11-10 NOTE — Telephone Encounter (Signed)
Signed.

## 2022-11-10 NOTE — Telephone Encounter (Signed)
Home health Plan of care forms have been received for provider signature.   Forms came with Dr. Marlaine Hind name attached.   Pt is scheduled to see Dr. Biagio Quint 11/15/22.   Forms placed in provider's to be signed folder.

## 2022-11-15 ENCOUNTER — Inpatient Hospital Stay: Payer: Medicare PPO | Admitting: Family Medicine

## 2022-11-16 ENCOUNTER — Ambulatory Visit: Payer: Medicare PPO | Admitting: Family Medicine

## 2022-11-16 ENCOUNTER — Encounter: Payer: Self-pay | Admitting: Family Medicine

## 2022-11-16 VITALS — BP 120/62 | HR 74 | Temp 98.9°F | Ht 67.0 in | Wt 191.2 lb

## 2022-11-16 DIAGNOSIS — R051 Acute cough: Secondary | ICD-10-CM | POA: Diagnosis not present

## 2022-11-16 DIAGNOSIS — J9621 Acute and chronic respiratory failure with hypoxia: Secondary | ICD-10-CM

## 2022-11-16 DIAGNOSIS — J441 Chronic obstructive pulmonary disease with (acute) exacerbation: Secondary | ICD-10-CM

## 2022-11-16 DIAGNOSIS — H9193 Unspecified hearing loss, bilateral: Secondary | ICD-10-CM

## 2022-11-16 MED ORDER — DEXAMETHASONE SODIUM PHOSPHATE 100 MG/10ML IJ SOLN
10.0000 mg | Freq: Once | INTRAMUSCULAR | Status: AC
  Administered 2022-11-16: 10 mg via INTRAMUSCULAR

## 2022-11-16 NOTE — Progress Notes (Signed)
Jessica Brockman T. Mattilynn Forrer, MD, [place] at [facility] [place], [address]  Phone: [phone]  FAX: [place] - 88 y.o. female  MRN [id number]  Date of Birth: 02/20/1926  Date: 11/16/2022  PCP: System, Provider Not In  Referral: No ref. provider found  Chief Complaint  Patient presents with   Cough   Wheezing   Subjective:   Jessica Erickson is a 88 y.o. very pleasant female patient with Body mass index is 29.95 kg/m. who presents with the following:  The patient presents with an acute cough and shortness of breath.  The patient is deaf, so I needed to communicate with her via typing on the computer.  Also spoke to the patient's daughter on the telephone as well as her 2 different caregivers to provide additional history.  Briefly, she has been hospitalized twice recently, and she was hospitalized roughly 10 days ago with some hypoxic respiratory failure.  She uses her DuoNebs roughly twice a day, but per her caregivers, her technique is poor, she does not really like to do them very much. Also with her more acute respiratory failure, she was given prednisone, but she was highly reluctant to take it, she did not take it, and she was worried about multiple things including some anxiety as well as some worsening of her blood sugar.  She was recently hospitalized with hypoxic respiratory failure.  Was on Bipap for 7 hours. On 2 L of oxygen at baseline. She has received 2 rounds of antibiotics, as well.  She was seen by her primary care doctor on November 03, 2022, at that point she was placed on doxycycline as well as oral prednisone. In the hospital, she was given a course of azithromycin.  Cough has not gotten any better.  Coughing up brown and yellow.   At baseline, Trelegy -she does do this every day with good effort. Duonebs - twice a day poor effort, and she does not like to do them very much.   Caregivers question her technique.  Decadron 10 mg IM now  Repeat decadron 10 mg po on Monday  Review of Systems is noted in the HPI, as appropriate  Patient Active Problem List   Diagnosis Date Noted   Acute respiratory failure with hypoxia and hypercapnia ([place]) 11/06/2022   Acute encephalopathy 11/06/2022   COPD with acute exacerbation ([place]) 11/06/2022   Acute on chronic diastolic CHF (congestive heart failure) ([place]) 11/06/2022   UTI (urinary tract infection) 11/03/2022   FTT (failure to thrive) in adult 10/25/2022   Nausea and vomiting 10/24/2022   Dyslipidemia 10/24/2022   Hypothyroidism 10/24/2022   GERD without esophagitis 10/24/2022   Hearing loss 09/01/2022   DNR (do not resuscitate) 08/01/2022   Iron deficiency 07/26/2022   Abnormal gait 10/19/2021   At high risk for falls 10/14/2021   Spinal stenosis of lumbar region 09/30/2021   Pain in both lower extremities 09/30/2021   Osteoarthritis 09/30/2021   Lumbar and sacral arthritis 09/30/2021   Acute on chronic respiratory failure ([place]) 08/14/2021   Shortness of breath 08/13/2021   CKD (chronic kidney disease), stage III ([place]) 08/13/2021   History of CVA (cerebrovascular accident) 08/13/2021   Prediabetes 06/29/2021   Pulmonary artery hypertension ([place]) 05/03/2021   Stage 4 chronic kidney disease ([place]) 03/29/2021   Grade I diastolic dysfunction 74/82/7078   Obesity (BMI 30-39.9) 08/16/2020   Chronic kidney disease, stage 3b ([place]) 04/06/2020  Thrombocytopenia ([place]) 04/06/2020   Seborrheic keratoses 04/06/2020   Hip pain 09/30/2019   History of deep vein thrombosis (DVT) of lower extremity 09/30/2019   Arthritis 09/30/2019   Fall 03/11/2019   Epistaxis 12/11/2018   History of DVT (deep vein thrombosis) 11/12/2018   Bilateral lower extremity edema 06/04/2018   Hearing loss of both ears 06/04/2018   Actinic keratosis 06/04/2018   Bronchiectasis ([place]) 06/04/2018   Depression 05/17/2018   Dehydration 05/15/2018    Hypertension 05/03/2018   Lymphedema 05/02/2018   Iron deficiency anemia 05/02/2018   Physical deconditioning 05/02/2018   Drug interaction 05/02/2018   Supratherapeutic INR 05/02/2018   Presence of IVC filter 05/02/2018   Anemia 04/11/2018   Fatigue 04/11/2018   Moderate protein-calorie malnutrition ([place]) 03/22/2018   Orthostasis 03/05/2018   Dizziness 03/04/2018   COPD (chronic obstructive pulmonary disease) ([place]) 12/13/2017   Anxiety and depression 12/13/2017   Trigeminal neuralgia 12/13/2017   GERD (gastroesophageal reflux disease) 12/13/2017   Type 2 diabetes mellitus with stage 3b chronic kidney disease, without long-term current use of insulin ([place]) 12/13/2017   Constipation 12/13/2017   Insomnia 12/13/2017   Hypothyroid 12/13/2017   Low back pain 10/08/2015   Collapsed vertebra, not elsewhere classified, thoracic region, initial encounter for fracture ([place]) 09/20/2015   Basal cell carcinoma of scalp 04/06/2014   Fothergill's neuralgia 08/05/2013   Bladder infection, chronic 02/11/2013   Female genuine stress incontinence 02/11/2013   Incomplete bladder emptying 02/11/2013   Intrinsic sphincter deficiency 02/11/2013   Mixed incontinence 02/11/2013   Excessive urination at night 02/11/2013   Bladder retention 02/11/2013   FOM (frequency of micturition) 02/11/2013   Basal cell carcinoma of face 09/13/2011    Past Medical History:  Diagnosis Date   Acoustic neuroma (HCC)    Allergy    Asthma    Bilateral swelling of feet    and legs   Bladder infection    CAD (coronary artery disease)    Cataract    Change in voice    Compression fracture of body of thoracic vertebra ([place])    T12 09/18/15 MRI s/p fall    Constipation    COPD (chronic obstructive pulmonary disease) ([place])    previous CXR with chronic interstitial lung dz    CVA (cerebral vascular accident) ([place])    Depression    Diabetes ([place])    with neuropathy   Diabetes mellitus, type 2 ([place])    Diarrhea     Double vision    DVT (deep venous thrombosis) ([place])    right leg 10/2015 was on coumadin off as of 2017/2018 ; s/p IVC filter   Enuresis    Eye pain, right    Fall    Fatty liver    09/15/15 also mildly dilated pancreatitic duct rec MRCP small sub cm cyst hemangioma speeln mild right hydronephrorossi and prox. hydroureter, kidney stones, mild scarring kidneys   Female stress incontinence    Flank pain    GERD (gastroesophageal reflux disease)    with small hiatal hernia    Hard of hearing    Heart disease    History of kidney problems    Hyperlipidemia    mixed   Hypertension    Hypothyroidism, postsurgical    Impaired mobility and ADLs    uses rolling walker has caretaker 24/7 at home   Leg edema    Mixed incontinence urge and stress (female)(female)    Neuropathy    Osteoarthritis    DDD spine  Osteoporosis with fracture    T12 compression fracture   Photophobia    Pulmonary embolism ([place])    10/2015 off coumadin as of 04/2016   Pulmonary HTN (HCC)    mild pulm HTN, echo 10/09/15 EF 00-92%ZRAQT 1 dd, RV systolic pressure increased    Recurrent UTI    Sinus pressure    Skin cancer    BCC jawline and scalp    Thyroid disease    follows [facility]   TIA (transient ischemic attack)    MRI 2009/2010 neg stroke    Trigeminal neuralgia    Dr. Tomi Bamberger s/p gamma knife x 2, on Tegretol since 2011/2012 no increase in dose >200 mg bid rec per family per neurology    Urinary frequency    Urinary, incontinence, stress female    Dr Erlene Quan urology     Past Surgical History:  Procedure Laterality Date   APPENDECTOMY     as a child, open   [place] removal 1996    brain tumor surgery     BREAST SURGERY     breast bx   CATARACT EXTRACTION     CHOLECYSTECTOMY     EYE SURGERY     cataract   IVC FILTER PLACEMENT ([place] HX)     Dr. Lucky Cowboy 10/2015    LAPAROSCOPIC TUBAL LIGATION     MOHS SURGERY     scalp 04/2014    PERIPHERAL VASCULAR CATHETERIZATION  N/A 10/11/2015   Procedure: IVC Filter Insertion;  Surgeon: Algernon Huxley, MD;  Location: [facility];  Service: Cardiovascular;  Laterality: N/A;   PERIPHERAL VASCULAR THROMBECTOMY Bilateral 03/29/2018   Procedure: PERIPHERAL VASCULAR THROMBECTOMY;  Surgeon: Algernon Huxley, MD;  Location: [facility];  Service: Cardiovascular;  Laterality: Bilateral;   PUBOVAGINAL SLING     THROAT SURGERY     THYROID SURGERY     tumor around vocal cords    TOOTH EXTRACTION     winter 2018    TOTAL THYROIDECTOMY  1976    Family History  Problem Relation Age of Onset   Heart disease Mother    Diabetes Father    Cancer Daughter        breast ca x 2 s/p mastectomy     Social History   Social History Narrative   Lives at home with [name] son and caretakers come to house    Has children    Likes to ride stationary bike and sew    [name] lives with as of 2022 and he is POA youngest son              Objective:   BP 120/62   Pulse 74   Temp 98.9 F (37.2 C) (Oral)   Ht _0  (1.702 m)   Wt 191 lb 4 oz (86.8 kg)   LMP  (LMP Unknown)   SpO2 93%   BMI 29.95 kg/m    Gen: WDWN, NAD. Globally Non-toxic HEENT: Throat clear, w/o exudate, R TM clear, L TM - good landmarks, No fluid present. rhinnorhea.  MMM Frontal sinuses: NT Max sinuses: NT NECK: Anterior cervical  LAD is absent CV: RRR, No M/G/R, cap refill <2 sec PULM: Breathing comfortably in no respiratory distress.  She has some intermittent rhonchorous sounds throughout both lung fields.  There are no focal crackles.  She is wheezing throughout bilateral lungs.  Laboratory and Imaging Data:  Assessment and Plan:     ICD-10-CM  1. COPD with acute exacerbation ([place])  J44.1     2. Acute cough  R05.1 dexamethasone (DECADRON) injection 10 mg    3. Acute on chronic respiratory failure with hypoxia (HCC)  J96.21     4. Bilateral deafness  H91.93      Total encounter time: 40 minutes. This includes total time spent on the  day of encounter.  Additional time spent on communication on the patient with typing, given that she is deaf.  I also spoke to the patient's daughter on her phone for an extended period of time.  Also additional history was gleaned from the caregivers.  Acute on chronic respiratory failure with baseline oxygen with exacerbation.  COPD with exacerbation.  Challenging situation, and patient was recently hospitalized and on BiPAP for several hours due to respiratory failure.  Think that this needs to be handled relatively aggressively.  She is reluctant to do oral prednisone.  I am going to give her a shot of IM Decadron in the office, and I confirmed all of this with the patient's daughter who is a retired Warden/ranger.  Also gave the patient's caregiver some Decadron 10 mg, and they are going to give it to her orally on Monday.  They also wanted me to emphasize nutritional status, so I reviewed with her the importance of eating protein and fat right now to help her body recuperate.  She is going to try to do her DuoNebs more frequently than her twice daily at baseline.  Her primary care doctor has left the area, so she should be reestablishing with one of the primary care doctors at her home office at [facility].  Medication Management during today's office visit: Meds ordered this encounter  Medications   dexamethasone (DECADRON) injection 10 mg   There are no discontinued medications.  Orders placed today for conditions managed today: No orders of the defined types were placed in this encounter.   Disposition: No follow-ups on file.  Dragon [employer] One speech-to-text software was used for transcription in this dictation.  Possible transcriptional errors can occur using Editor, commissioning.   Signed,  Maud Deed. Maryfrances Portugal, MD   Outpatient Encounter Medications as of 11/16/2022  Medication Sig   Accu-Chek Softclix Lancets lancets Use as instructed   acetaminophen (TYLENOL) 500 MG  tablet Take 2 tablets (1,000 mg total) by mouth 2 (two) times daily as needed.   albuterol (PROVENTIL) (2.5 MG/3ML) 0.083% nebulizer solution Take 3 mLs (2.5 mg total) by nebulization in the morning, at noon, and at bedtime.   apixaban (ELIQUIS) 2.5 MG TABS tablet Take 1 tablet (2.5 mg total) by mouth 2 (two) times daily.   atorvastatin (LIPITOR) 40 MG tablet Take 1 tablet (40 mg total) by mouth daily at 6 PM. Generic ok   bisacodyl (DULCOLAX) 5 MG EC tablet Take 5 mg by mouth daily as needed for moderate constipation.   Blood Glucose Monitoring Suppl (ACCU-CHEK AVIVA PLUS) w/Device KIT Use as directed   calcium-vitamin D (OSCAL WITH D) 500-5 MG-MCG tablet Take 1 tablet by mouth 2 (two) times daily. Unable to find former dose 600ca/400 vitamin D inform patient dose change Oscal   cetirizine (ZYRTEC) 10 MG tablet Take 1 tablet (10 mg total) by mouth daily.   feeding supplement, GLUCERNA SHAKE, (GLUCERNA SHAKE) LIQD Take 237 mLs by mouth 2 (two) times daily between meals.   Fluticasone-Umeclidin-Vilant (TRELEGY ELLIPTA) 100-62.5-25 MCG/ACT AEPB INHALE ONE PUFF DAILY. RINSE MOUTH AFTERUSE. STOP SYMBICORT & SPIRIVA   furosemide (  LASIX) 40 MG tablet TAKE ONE TABLET (40MG) DAILY. MAY TAKE ASECOND DOSE OF 40MG IF NEEDED AT LUNCH FOR 5 DAYS   gabapentin (NEURONTIN) 100 MG capsule TAKE 2 CAPSULES BY MOUTH 3 TIMES DAILY. (Patient taking differently: Take 200 mg by mouth 3 (three) times daily. TAKE 2 CAPSULES BY MOUTH 3 TIMES DAILY.)   glucose blood test strip Use to test blood sugar twice daily.   guaiFENesin (MUCINEX) 600 MG 12 hr tablet Take 2 tablets (1,200 mg total) by mouth 2 (two) times daily as needed for cough or to loosen phlegm. (Patient taking differently: Take 600 mg by mouth 2 (two) times daily as needed for cough or to loosen phlegm.)   ipratropium-albuterol (DUONEB) 0.5-2.5 (3) MG/3ML SOLN Take 3 mLs by nebulization every 4 (four) hours as needed.   levothyroxine (SYNTHROID) 175 MCG tablet Take 1  tablet (175 mcg total) by mouth daily before breakfast. D/c 200 mcg dose Do not take with other medications or vitamins   Lidocaine-Menthol (ICY HOT MAX LIDOCAINE) 4-1 % CREA Apply 1 application. topically 3 (three) times daily with meals as needed. Dispense cream or spray pt preference   Melatonin 10 MG TABS Take 10 mg by mouth at bedtime.   mirtazapine (REMERON) 15 MG tablet Take 1 tablet (15 mg total) by mouth at bedtime.   Multiple Vitamin (MULTIVITAMIN WITH MINERALS) TABS tablet Take 1 tablet by mouth daily.    pantoprazole (PROTONIX) 40 MG tablet Take 1 tablet (40 mg total) by mouth daily. 30 minutes before lunch or dinner   polyethylene glycol powder (GLYCOLAX/MIRALAX) 17 GM/SCOOP powder Take 17 g by mouth daily.   polyvinyl alcohol (LIQUIFILM TEARS) 1.4 % ophthalmic solution Place 1 drop into both eyes at bedtime.   predniSONE (DELTASONE) 20 MG tablet Take 2 tablets (40 mg total) by mouth daily with breakfast.   [EXPIRED] dexamethasone (DECADRON) injection 10 mg    No facility-administered encounter medications on file as of 11/16/2022.

## 2022-11-25 NOTE — Progress Notes (Signed)
Pt age with symptoms required covid/flu testing.

## 2022-12-29 ENCOUNTER — Encounter: Payer: Self-pay | Admitting: Family Medicine

## 2022-12-29 ENCOUNTER — Ambulatory Visit: Payer: Medicare PPO | Admitting: Family Medicine

## 2022-12-29 VITALS — BP 120/70 | HR 71 | Temp 98.3°F | Ht 67.0 in | Wt 192.2 lb

## 2022-12-29 DIAGNOSIS — J441 Chronic obstructive pulmonary disease with (acute) exacerbation: Secondary | ICD-10-CM | POA: Diagnosis not present

## 2022-12-29 DIAGNOSIS — K047 Periapical abscess without sinus: Secondary | ICD-10-CM | POA: Insufficient documentation

## 2022-12-29 NOTE — Progress Notes (Signed)
Tommi Rumps, MD Phone: [phone]  Jessica Erickson is a 89 y.o. female who presents today for f/u.  A whiteboard was used to communicate with the patient.  She does not know sign language.  COPD: Medication compliance- using trelegy daily  Rescue inhaler use- using albuterol nebulizer BID Dyspnea- stable  Wheezing- no  Cough- much improved and stable  Dental infection: Patients caregiver reports she saw the dentist earlier this week.  They placed her on azithromycin and have her seeing an oral surgeon.  Patient notes no fevers.  The pain in her tooth has improved.   Social History   Tobacco Use  Smoking Status Former   Packs/day: 0.50   Years: 20.00   Total pack years: 10.00   Types: Cigarettes   Quit date: 09/20/1995   Years since quitting: 27.2  Smokeless Tobacco Never  Tobacco Comments   quit 1996 smoked 20 years max 8 cig qd     Current Outpatient Medications on File Prior to Visit  Medication Sig Dispense Refill   Accu-Chek Softclix Lancets lancets Use as instructed 100 each 12   acetaminophen (TYLENOL) 500 MG tablet Take 2 tablets (1,000 mg total) by mouth 2 (two) times daily as needed. 360 tablet 3   albuterol (PROVENTIL) (2.5 MG/3ML) 0.083% nebulizer solution Take 3 mLs (2.5 mg total) by nebulization in the morning, at noon, and at bedtime. 360 mL 11   apixaban (ELIQUIS) 2.5 MG TABS tablet Take 1 tablet (2.5 mg total) by mouth 2 (two) times daily. 180 tablet 3   atorvastatin (LIPITOR) 40 MG tablet Take 1 tablet (40 mg total) by mouth daily at 6 PM. Generic ok 90 tablet 3   azithromycin (ZITHROMAX) 250 MG tablet Take 250 mg by mouth as directed.     bisacodyl (DULCOLAX) 5 MG EC tablet Take 5 mg by mouth daily as needed for moderate constipation.     Blood Glucose Monitoring Suppl (ACCU-CHEK AVIVA PLUS) w/Device KIT Use as directed 1 kit 0   calcium-vitamin D (OSCAL WITH D) 500-5 MG-MCG tablet Take 1 tablet by mouth 2 (two) times daily. Unable to find former dose  600ca/400 vitamin D inform patient dose change Oscal 180 tablet 3   cetirizine (ZYRTEC) 10 MG tablet Take 1 tablet (10 mg total) by mouth daily. 90 tablet 3   feeding supplement, GLUCERNA SHAKE, (GLUCERNA SHAKE) LIQD Take 237 mLs by mouth 2 (two) times daily between meals.  0   Fluticasone-Umeclidin-Vilant (TRELEGY ELLIPTA) 100-62.5-25 MCG/ACT AEPB INHALE ONE PUFF DAILY. RINSE MOUTH AFTERUSE. STOP SYMBICORT & SPIRIVA 60 each 11   furosemide (LASIX) 40 MG tablet TAKE ONE TABLET ('40MG'$ ) DAILY. MAY TAKE ASECOND DOSE OF '40MG'$  IF NEEDED AT LUNCH FOR 5 DAYS 120 tablet 3   gabapentin (NEURONTIN) 100 MG capsule TAKE 2 CAPSULES BY MOUTH 3 TIMES DAILY. (Patient taking differently: Take 200 mg by mouth 3 (three) times daily. TAKE 2 CAPSULES BY MOUTH 3 TIMES DAILY.) 504 capsule 3   glucose blood test strip Use to test blood sugar twice daily. 200 each 3   guaiFENesin (MUCINEX) 600 MG 12 hr tablet Take 2 tablets (1,200 mg total) by mouth 2 (two) times daily as needed for cough or to loosen phlegm. (Patient taking differently: Take 600 mg by mouth 2 (two) times daily as needed for cough or to loosen phlegm.)     ipratropium-albuterol (DUONEB) 0.5-2.5 (3) MG/3ML SOLN Take 3 mLs by nebulization every 4 (four) hours as needed. 360 mL 11   levothyroxine (SYNTHROID)  175 MCG tablet Take 1 tablet (175 mcg total) by mouth daily before breakfast. D/c 200 mcg dose Do not take with other medications or vitamins 90 tablet 3   Lidocaine-Menthol (ICY HOT MAX LIDOCAINE) 4-1 % CREA Apply 1 application. topically 3 (three) times daily with meals as needed. Dispense cream or spray pt preference 120 g 11   Melatonin 10 MG TABS Take 10 mg by mouth at bedtime. 90 tablet 3   mirtazapine (REMERON) 15 MG tablet Take 1 tablet (15 mg total) by mouth at bedtime. 90 tablet 3   Multiple Vitamin (MULTIVITAMIN WITH MINERALS) TABS tablet Take 1 tablet by mouth daily.      pantoprazole (PROTONIX) 40 MG tablet Take 1 tablet (40 mg total) by mouth  daily. 30 minutes before lunch or dinner 90 tablet 3   polyethylene glycol powder (GLYCOLAX/MIRALAX) 17 GM/SCOOP powder Take 17 g by mouth daily. 255 g 11   polyvinyl alcohol (LIQUIFILM TEARS) 1.4 % ophthalmic solution Place 1 drop into both eyes at bedtime. 15 mL 0   predniSONE (DELTASONE) 20 MG tablet Take 2 tablets (40 mg total) by mouth daily with breakfast. 10 tablet 0   No current facility-administered medications on file prior to visit.     ROS see history of present illness  Objective  Physical Exam Vitals:   12/29/22 1511  BP: 120/70  Pulse: 71  Temp: 98.3 F (36.8 C)  SpO2: 96%    BP Readings from Last 3 Encounters:  12/29/22 120/70  11/16/22 120/62  11/09/22 136/74   Wt Readings from Last 3 Encounters:  12/29/22 192 lb 3.2 oz (87.2 kg)  11/16/22 191 lb 4 oz (86.8 kg)  11/06/22 202 lb 13.2 oz (92 kg)    Physical Exam Constitutional:      General: She is not in acute distress.    Appearance: She is not diaphoretic.  Cardiovascular:     Rate and Rhythm: Normal rate and regular rhythm.     Heart sounds: Normal heart sounds.  Pulmonary:     Effort: Pulmonary effort is normal.     Comments: Somewhat coarse breath sounds throughout though it sounds like these are more coming from her upper airway/throat Skin:    General: Skin is warm and dry.  Neurological:     Mental Status: She is alert.      Assessment/Plan: Please see individual problem list.  Chronic obstructive pulmonary disease with acute exacerbation ([place]) Assessment & Plan: Chronic issue.  Currently back to her baseline.  She will continue Trelegy and her albuterol nebulizer treatments.  If she has any worsening symptoms at any point she will contact us immediately.   Dental infection Assessment & Plan: She will complete her antibiotics per her dentist.  She will see the oral surgeon next week.    Return in about 3 months (around 03/30/2023).   Tommi Rumps, MD [place]

## 2022-12-29 NOTE — Assessment & Plan Note (Signed)
Chronic issue.  Currently back to her baseline.  She will continue Trelegy and her albuterol nebulizer treatments.  If she has any worsening symptoms at any point she will contact us immediately.

## 2022-12-29 NOTE — Patient Instructions (Signed)
Nice to see you. Please continue your Trelegy and your nebulizer treatments.  If your breathing worsens at any point please contact us right away.

## 2022-12-29 NOTE — Assessment & Plan Note (Signed)
She will complete her antibiotics per her dentist.  She will see the oral surgeon next week.

## 2023-01-01 ENCOUNTER — Encounter: Payer: Medicare PPO | Admitting: Family Medicine

## 2023-01-05 ENCOUNTER — Telehealth: Payer: Self-pay

## 2023-01-05 NOTE — Telephone Encounter (Signed)
We received a request for medical clearance from Annamaria Helling, DDS, MD.  I sent a copy to provider's S drive folder and hand-delivered a copy to Fulton Mole, [place].

## 2023-01-09 NOTE — Telephone Encounter (Addendum)
Pt caregiver called stating the son wanted to know if the provider received a clearance form from the dentist. Pt son stated the pt teeth is hurting her bad

## 2023-01-09 NOTE — Telephone Encounter (Signed)
Form filled out and signed.  Please fax.  The patient will need to stop her Eliquis 2 days prior to her dental extraction.  She can resume the Eliquis the day after the procedure as long as the dentist feels as though this is okay.  If she develops any leg swelling, chest pain, or shortness of breath while off the Eliquis she needs to be evaluated.

## 2023-01-09 NOTE — Telephone Encounter (Signed)
Form is in the sign basket.  Genevie Elman,cma

## 2023-01-11 NOTE — Telephone Encounter (Signed)
Form was faxed and confirmation was given.  Saysha Menta,cma

## 2023-01-18 ENCOUNTER — Ambulatory Visit (INDEPENDENT_AMBULATORY_CARE_PROVIDER_SITE_OTHER): Payer: Medicare PPO

## 2023-01-18 VITALS — Ht 67.0 in | Wt 192.0 lb

## 2023-01-18 DIAGNOSIS — Z0000 Encounter for general adult medical examination without abnormal findings: Secondary | ICD-10-CM | POA: Diagnosis not present

## 2023-01-18 NOTE — Patient Instructions (Addendum)
Jessica Erickson , Thank you for taking time to come for your Medicare Wellness Visit. I appreciate your ongoing commitment to your health goals. Please review the following plan we discussed and let me know if I can assist you in the future.   These are the goals we discussed:    This is a list of the screening recommended for you and due dates:  Health Maintenance  Topic Date Due   DTaP/Tdap/Td vaccine (1 - Tdap) Never done   Eye exam for diabetics  01/18/2023*   COVID-19 Vaccine (5 - 2023-24 season) 02/03/2023*   Zoster (Shingles) Vaccine (1 of 2) 04/18/2023*   Pneumonia Vaccine (2 of 2 - PCV) 07/05/2023*   DEXA scan (bone density measurement)  01/19/2024*   Hemoglobin A1C  01/25/2023   Complete foot exam   04/28/2023   Medicare Annual Wellness Visit  01/19/2024   Flu Shot  Completed   HPV Vaccine  Aged Out  *Topic was postponed. The date shown is not the original due date.    Advanced directives: on file  Conditions/risks identified: none new  Next appointment: Follow up in one year for your annual wellness visit    Preventive Care 65 Years and Older, Female Preventive care refers to lifestyle choices and visits with your health care provider that can promote health and wellness. What does preventive care include? A yearly physical exam. This is also called an annual well check. Dental exams once or twice a year. Routine eye exams. Ask your health care provider how often you should have your eyes checked. Personal lifestyle choices, including: Daily care of your teeth and gums. Regular physical activity. Eating a healthy diet. Avoiding tobacco and drug use. Limiting alcohol use. Practicing safe sex. Taking low-dose aspirin every day. Taking vitamin and mineral supplements as recommended by your health care provider. What happens during an annual well check? The services and screenings done by your health care provider during your annual well check will depend on your age,  overall health, lifestyle risk factors, and family history of disease. Counseling  Your health care provider may ask you questions about your: Alcohol use. Tobacco use. Drug use. Emotional well-being. Home and relationship well-being. Sexual activity. Eating habits. History of falls. Memory and ability to understand (cognition). Work and work Statistician. Reproductive health. Screening  You may have the following tests or measurements: Height, weight, and BMI. Blood pressure. Lipid and cholesterol levels. These may be checked every 5 years, or more frequently if you are over 8 years old. Skin check. Lung cancer screening. You may have this screening every year starting at age 63 if you have a 30-pack-year history of smoking and currently smoke or have quit within the past 15 years. Fecal occult blood test (FOBT) of the stool. You may have this test every year starting at age 57. Flexible sigmoidoscopy or colonoscopy. You may have a sigmoidoscopy every 5 years or a colonoscopy every 10 years starting at age 10. Hepatitis C blood test. Hepatitis B blood test. Sexually transmitted disease (STD) testing. Diabetes screening. This is done by checking your blood sugar (glucose) after you have not eaten for a while (fasting). You may have this done every 1-3 years. Bone density scan. This is done to screen for osteoporosis. You may have this done starting at age 22. Mammogram. This may be done every 1-2 years. Talk to your health care provider about how often you should have regular mammograms. Talk with your health care provider about your  test results, treatment options, and if necessary, the need for more tests. Vaccines  Your health care provider may recommend certain vaccines, such as: Influenza vaccine. This is recommended every year. Tetanus, diphtheria, and acellular pertussis (Tdap, Td) vaccine. You may need a Td booster every 10 years. Zoster vaccine. You may need this after age  25. Pneumococcal 13-valent conjugate (PCV13) vaccine. One dose is recommended after age 66. Pneumococcal polysaccharide (PPSV23) vaccine. One dose is recommended after age 19. Talk to your health care provider about which screenings and vaccines you need and how often you need them. This information is not intended to replace advice given to you by your health care provider. Make sure you discuss any questions you have with your health care provider. Document Released: 12/17/2015 Document Revised: 08/09/2016 Document Reviewed: 09/21/2015 Elsevier Interactive Patient Education  2017 [place] Prevention in the Home Falls can cause injuries. They can happen to people of all ages. There are many things you can do to make your home safe and to help prevent falls. What can I do on the outside of my home? Regularly fix the edges of walkways and driveways and fix any cracks. Remove anything that might make you trip as you walk through a door, such as a raised step or threshold. Trim any bushes or trees on the path to your home. Use bright outdoor lighting. Clear any walking paths of anything that might make someone trip, such as rocks or tools. Regularly check to see if handrails are loose or broken. Make sure that both sides of any steps have handrails. Any raised decks and porches should have guardrails on the edges. Have any leaves, snow, or ice cleared regularly. Use sand or salt on walking paths during winter. Clean up any spills in your garage right away. This includes oil or grease spills. What can I do in the bathroom? Use night lights. Install grab bars by the toilet and in the tub and shower. Do not use towel bars as grab bars. Use non-skid mats or decals in the tub or shower. If you need to sit down in the shower, use a plastic, non-slip stool. Keep the floor dry. Clean up any water that spills on the floor as soon as it happens. Remove soap buildup in the tub or shower  regularly. Attach bath mats securely with double-sided non-slip rug tape. Do not have throw rugs and other things on the floor that can make you trip. What can I do in the bedroom? Use night lights. Make sure that you have a light by your bed that is easy to reach. Do not use any sheets or blankets that are too big for your bed. They should not hang down onto the floor. Have a firm chair that has side arms. You can use this for support while you get dressed. Do not have throw rugs and other things on the floor that can make you trip. What can I do in the kitchen? Clean up any spills right away. Avoid walking on wet floors. Keep items that you use a lot in easy-to-reach places. If you need to reach something above you, use a strong step stool that has a grab bar. Keep electrical cords out of the way. Do not use floor polish or wax that makes floors slippery. If you must use wax, use non-skid floor wax. Do not have throw rugs and other things on the floor that can make you trip. What can I do with my  stairs? Do not leave any items on the stairs. Make sure that there are handrails on both sides of the stairs and use them. Fix handrails that are broken or loose. Make sure that handrails are as long as the stairways. Check any carpeting to make sure that it is firmly attached to the stairs. Fix any carpet that is loose or worn. Avoid having throw rugs at the top or bottom of the stairs. If you do have throw rugs, attach them to the floor with carpet tape. Make sure that you have a light switch at the top of the stairs and the bottom of the stairs. If you do not have them, ask someone to add them for you. What else can I do to help prevent falls? Wear shoes that: Do not have high heels. Have rubber bottoms. Are comfortable and fit you well. Are closed at the toe. Do not wear sandals. If you use a stepladder: Make sure that it is fully opened. Do not climb a closed stepladder. Make sure that  both sides of the stepladder are locked into place. Ask someone to hold it for you, if possible. Clearly mark and make sure that you can see: Any grab bars or handrails. First and last steps. Where the edge of each step is. Use tools that help you move around (mobility aids) if they are needed. These include: Canes. Walkers. Scooters. Crutches. Turn on the lights when you go into a dark area. Replace any light bulbs as soon as they burn out. Set up your furniture so you have a clear path. Avoid moving your furniture around. If any of your floors are uneven, fix them. If there are any pets around you, be aware of where they are. Review your medicines with your doctor. Some medicines can make you feel dizzy. This can increase your chance of falling. Ask your doctor what other things that you can do to help prevent falls. This information is not intended to replace advice given to you by your health care provider. Make sure you discuss any questions you have with your health care provider. Document Released: 09/16/2009 Document Revised: 04/27/2016 Document Reviewed: 12/25/2014 Elsevier Interactive Patient Education  2017 [employer].

## 2023-01-18 NOTE — Progress Notes (Signed)
Subjective:   Jessica Erickson is a 89 y.o. female who presents for Medicare Annual (Subsequent) preventive examination.  Review of Systems    No ROS.  Medicare Wellness Virtual Visit.  Visual/audio telehealth visit, UTA vital signs.   See social history for additional risk factors.   Cardiac Risk Factors include: advanced age (>37mn, >>6women)     Objective:    Today's Vitals   01/18/23 1540  Weight: 192 lb (87.1 kg)  Height: 5' 7"$  (1.702 m)   Body mass index is 30.07 kg/m.     01/18/2023    3:46 PM 11/06/2022    1:38 PM 10/24/2022   12:00 PM 09/30/2021    2:35 PM 08/19/2021    4:40 PM 08/13/2021    5:57 PM 08/13/2021   12:56 PM  Advanced Directives  Does Patient Have a Medical Advance Directive? Yes No Yes Yes No  No  Type of [place]amed[place]of [place] will  Living will HSwinkLiving will     Does patient want to make changes to medical advance directive? No - Patient declined  No - Patient declined No - Patient declined     Copy of HJenningsin Chart? Yes - validated most recent copy scanned in chart (See row information)   Yes - validated most recent copy scanned in chart (See row information)     Would patient like information on creating a medical advance directive?     No - Patient declined No - Patient declined     Current Medications (verified) Outpatient Encounter Medications as of 01/18/2023  Medication Sig   Accu-Chek Softclix Lancets lancets Use as instructed   acetaminophen (TYLENOL) 500 MG tablet Take 2 tablets (1,000 mg total) by mouth 2 (two) times daily as needed.   albuterol (PROVENTIL) (2.5 MG/3ML) 0.083% nebulizer solution Take 3 mLs (2.5 mg total) by nebulization in the morning, at noon, and at bedtime.   apixaban (ELIQUIS) 2.5 MG TABS tablet Take 1 tablet (2.5 mg total) by mouth 2 (two) times daily.   atorvastatin (LIPITOR) 40 MG tablet Take 1 tablet (40 mg total) by mouth daily at 6  PM. Generic ok   azithromycin (ZITHROMAX) 250 MG tablet Take 250 mg by mouth as directed.   bisacodyl (DULCOLAX) 5 MG EC tablet Take 5 mg by mouth daily as needed for moderate constipation.   Blood Glucose Monitoring Suppl (ACCU-CHEK AVIVA PLUS) w/Device KIT Use as directed   calcium-vitamin D (OSCAL WITH D) 500-5 MG-MCG tablet Take 1 tablet by mouth 2 (two) times daily. Unable to find former dose 600ca/400 vitamin D inform patient dose change Oscal   cetirizine (ZYRTEC) 10 MG tablet Take 1 tablet (10 mg total) by mouth daily.   feeding supplement, GLUCERNA SHAKE, (GLUCERNA SHAKE) LIQD Take 237 mLs by mouth 2 (two) times daily between meals.   Fluticasone-Umeclidin-Vilant (TRELEGY ELLIPTA) 100-62.5-25 MCG/ACT AEPB INHALE ONE PUFF DAILY. RINSE MOUTH AFTERUSE. STOP SYMBICORT & SPIRIVA   furosemide (LASIX) 40 MG tablet TAKE ONE TABLET (40MG) DAILY. MAY TAKE ASECOND DOSE OF 40MG IF NEEDED AT LUNCH FOR 5 DAYS   gabapentin (NEURONTIN) 100 MG capsule TAKE 2 CAPSULES BY MOUTH 3 TIMES DAILY. (Patient taking differently: Take 200 mg by mouth 3 (three) times daily. TAKE 2 CAPSULES BY MOUTH 3 TIMES DAILY.)   glucose blood test strip Use to test blood sugar twice daily.   guaiFENesin (MUCINEX) 600 MG 12 hr tablet Take 2 tablets (1,200 mg  total) by mouth 2 (two) times daily as needed for cough or to loosen phlegm. (Patient taking differently: Take 600 mg by mouth 2 (two) times daily as needed for cough or to loosen phlegm.)   ipratropium-albuterol (DUONEB) 0.5-2.5 (3) MG/3ML SOLN Take 3 mLs by nebulization every 4 (four) hours as needed.   levothyroxine (SYNTHROID) 175 MCG tablet Take 1 tablet (175 mcg total) by mouth daily before breakfast. D/c 200 mcg dose Do not take with other medications or vitamins   Lidocaine-Menthol (ICY HOT MAX LIDOCAINE) 4-1 % CREA Apply 1 application. topically 3 (three) times daily with meals as needed. Dispense cream or spray pt preference   Melatonin 10 MG TABS Take 10 mg by mouth at  bedtime.   mirtazapine (REMERON) 15 MG tablet Take 1 tablet (15 mg total) by mouth at bedtime.   Multiple Vitamin (MULTIVITAMIN WITH MINERALS) TABS tablet Take 1 tablet by mouth daily.    pantoprazole (PROTONIX) 40 MG tablet Take 1 tablet (40 mg total) by mouth daily. 30 minutes before lunch or dinner   polyethylene glycol powder (GLYCOLAX/MIRALAX) 17 GM/SCOOP powder Take 17 g by mouth daily.   polyvinyl alcohol (LIQUIFILM TEARS) 1.4 % ophthalmic solution Place 1 drop into both eyes at bedtime.   predniSONE (DELTASONE) 20 MG tablet Take 2 tablets (40 mg total) by mouth daily with breakfast.   No facility-administered encounter medications on file as of 01/18/2023.    Allergies (verified) Penicillins, Sulfa antibiotics, Amitiza [lubiprostone], Aspirin, and Penicillin g   History: Past Medical History:  Diagnosis Date   Acoustic neuroma (HCC)    Allergy    Asthma    Bilateral swelling of feet    and legs   Bladder infection    CAD (coronary artery disease)    Cataract    Change in voice    Compression fracture of body of thoracic vertebra (HCC)    T12 09/18/15 MRI s/p fall    Constipation    COPD (chronic obstructive pulmonary disease) (HCC)    previous CXR with chronic interstitial lung dz    CVA (cerebral vascular accident) ([place])    Depression    Diabetes ([place])    with neuropathy   Diabetes mellitus, type 2 ([place])    Diarrhea    Double vision    DVT (deep venous thrombosis) ([place])    right leg 10/2015 was on coumadin off as of 2017/2018 ; s/p IVC filter   Enuresis    Eye pain, right    Fall    Fatty liver    09/15/15 also mildly dilated pancreatitic duct rec MRCP small sub cm cyst hemangioma speeln mild right hydronephrorossi and prox. hydroureter, kidney stones, mild scarring kidneys   Female stress incontinence    Flank pain    GERD (gastroesophageal reflux disease)    with small hiatal hernia    Hard of hearing    Heart disease    History of kidney problems     Hyperlipidemia    mixed   Hypertension    Hypothyroidism, postsurgical    Impaired mobility and ADLs    uses rolling walker has caretaker 24/7 at home   Leg edema    Mixed incontinence urge and stress (female)(female)    Neuropathy    Osteoarthritis    DDD spine    Osteoporosis with fracture    T12 compression fracture   Photophobia    Pulmonary embolism ([place])    10/2015 off coumadin as of 04/2016   Pulmonary HTN (  [place])    mild pulm HTN, echo 10/09/15 EF [id number] 1 dd, RV systolic pressure increased    Recurrent UTI    Sinus pressure    Skin cancer    BCC jawline and scalp    Thyroid disease    follows [facility]   TIA (transient ischemic attack)    MRI 2009/2010 neg stroke    Trigeminal neuralgia    Dr. Tomi Bamberger s/p gamma knife x 2, on Tegretol since 2011/2012 no increase in dose >200 mg bid rec per family per neurology    Urinary frequency    Urinary, incontinence, stress female    Dr Erlene Quan urology    Past Surgical History:  Procedure Laterality Date   APPENDECTOMY     as a child, open   [place] removal 1996    brain tumor surgery     BREAST SURGERY     breast bx   CATARACT EXTRACTION     CHOLECYSTECTOMY     EYE SURGERY     cataract   IVC FILTER PLACEMENT ([place] HX)     Dr. Lucky Cowboy 10/2015    LAPAROSCOPIC TUBAL LIGATION     MOHS SURGERY     scalp 04/2014    PERIPHERAL VASCULAR CATHETERIZATION N/A 10/11/2015   Procedure: IVC Filter Insertion;  Surgeon: Algernon Huxley, MD;  Location: [facility];  Service: Cardiovascular;  Laterality: N/A;   PERIPHERAL VASCULAR THROMBECTOMY Bilateral 03/29/2018   Procedure: PERIPHERAL VASCULAR THROMBECTOMY;  Surgeon: Algernon Huxley, MD;  Location: [facility];  Service: Cardiovascular;  Laterality: Bilateral;   PUBOVAGINAL SLING     THROAT SURGERY     THYROID SURGERY     tumor around vocal cords    TOOTH EXTRACTION     winter 2018    TOTAL THYROIDECTOMY  1976   Family History  Problem  Relation Age of Onset   Heart disease Mother    Diabetes Father    Cancer Daughter        breast ca x 2 s/p mastectomy    Social History   Socioeconomic History   Marital status: Widowed    Spouse name: Not on file   Number of children: 5   Years of education: Not on file   Highest education level: Not on file  Occupational History   Occupation: retired  Tobacco Use   Smoking status: Former    Packs/day: 0.50    Years: 20.00    Total pack years: 10.00    Types: Cigarettes    Quit date: 09/20/1995    Years since quitting: 27.3   Smokeless tobacco: Never   Tobacco comments:    quit 1996 smoked 20 years max 8 cig qd   Vaping Use   Vaping Use: Never used  Substance and Sexual Activity   Alcohol use: No   Drug use: No   Sexual activity: Not on file  Other Topics Concern   Not on file  Social History Narrative   Lives at home with [name] son and caretakers come to house    Has children    Likes to ride stationary bike and sew    [name] lives with as of 2022 and he is POA youngest son            Social Determinants of Health   Financial Resource Strain: [place]  (01/18/2023)   Overall Financial Resource Strain (CARDIA)    Difficulty of Paying Living  Expenses: Not hard at all  Food Insecurity: No Food Insecurity (01/18/2023)   Hunger Vital Sign    Worried About Running Out of Food in the Last Year: Never true    Ran Out of Food in the Last Year: Never true  Transportation Needs: No Transportation Needs (01/18/2023)   PRAPARE - Hydrologist (Medical): No    Lack of Transportation (Non-Medical): No  Physical Activity: Insufficiently Active (01/18/2023)   Exercise Vital Sign    Days of Exercise per Week: 7 days    Minutes of Exercise per Session: 20 min  Stress: No Stress Concern Present (01/18/2023)   [place]    Feeling of Stress : Not at all  Social Connections: Unknown  (01/18/2023)   Social Connection and Isolation Panel [NHANES]    Frequency of Communication with Friends and Family: More than three times a week    Frequency of Social Gatherings with Friends and Family: More than three times a week    Attends Religious Services: Not on Advertising copywriter or Organizations: Not on file    Attends Archivist Meetings: Not on file    Marital Status: Not on file    Tobacco Counseling Counseling given: Not Answered Tobacco comments: quit 1996 smoked 20 years max 8 cig qd    Clinical Intake:  Pre-visit preparation completed: Yes        Diabetes:  (Followed by pcp)  How often do you need to have someone help you when you read instructions, pamphlets, or other written materials from your doctor or pharmacy?: [place] Needed?: No      Activities of Daily Living    01/18/2023    3:47 PM 10/24/2022   12:00 PM  In your present state of health, do you have any difficulty performing the following activities:  Hearing? 1 1  Vision? 0 0  Difficulty concentrating or making decisions? 0 1  Walking or climbing stairs? 1 1  Dressing or bathing? 1 1  Doing errands, shopping? 1 0  Preparing Food and eating ? N   Using the Toilet? N   In the past six months, have you accidently leaked urine? Y   Comment Managed with daily depend   Do you have problems with loss of bowel control? N   Managing your Medications? N   Managing your Finances? N   Housekeeping or managing your Housekeeping? N     Patient Care Team: Leone Haven, MD as PCP - General (Family Medicine)  Indicate any recent Medical Services you may have received from other than [facility] providers in the past year (date may be approximate).     Assessment:   This is a routine wellness examination for Amarie.  I connected with  Dierdre Forth on 01/18/23 by a audio enabled telemedicine application and verified that I am speaking with the correct  person using two identifiers.  Patient Location: Home  Provider Location: Home Office  I discussed the limitations of evaluation and management by telemedicine. The patient expressed understanding and agreed to proceed.   Hearing/Vision screen Hearing Screening - Comments:: Deafness Vision Screening - Comments:: Followed by [facility] Wears corrective lenses  Cataract extraction, bilateral    Dietary issues and exercise activities discussed: Current Exercise Habits: Home exercise routine, Type of exercise: walking, Time (Minutes): 20, Frequency (Times/Week): 7, Weekly Exercise (Minutes/Week):  140, Intensity: Mild   Goals Addressed               This Visit's Progress     Patient Stated     Maintain healthy lifestyle (pt-stated)        Stay active with walking  Healthy diet        Depression Screen    01/18/2023    3:45 PM 11/03/2022    2:53 PM 07/25/2022    1:18 PM 09/30/2021    2:22 PM 06/28/2021    2:17 PM 03/29/2021    2:45 PM 11/11/2020    1:35 PM  PHQ 2/9 Scores  PHQ - 2 Score 0 0 1 0 0 0 0  PHQ- 9 Score     0 0 0    Fall Risk    01/18/2023    3:46 PM 12/29/2022    3:17 PM 11/03/2022    2:53 PM 07/25/2022    1:18 PM 09/30/2021    2:21 PM  [place] in the past year? 0 0 1 1 0  Number falls in past yr: 0 0 1 1 0  Injury with Fall? 0 0 0 0 0  Risk for fall due to :  No Fall Risks History of fall(s) History of fall(s) No Fall Risks;Impaired balance/gait  Follow up Falls evaluation completed;Falls prevention discussed Falls evaluation completed Falls evaluation completed Falls evaluation completed Falls evaluation completed    FALL RISK PREVENTION PERTAINING TO THE HOME: Home free of loose throw rugs in walkways, pet beds, electrical cords, etc? Yes  Adequate lighting in your home to reduce risk of falls? Yes   ASSISTIVE DEVICES UTILIZED TO PREVENT FALLS: Life alert? No  Use of a cane, walker or w/c? Yes  Grab bars in the bathroom? Yes   Shower chair or bench in shower? Yes  Elevated toilet seat or a handicapped toilet? Yes   TIMED UP AND GO: Was the test performed? No .   Cognitive Function:        01/18/2023    3:48 PM 09/29/2020   12:16 PM 09/19/2018   11:21 AM  6CIT Screen  What Year? 0 points 0 points 0 points  What month? 0 points 0 points 0 points  What time? 0 points 0 points 0 points  Count back from 20 0 points  0 points  Repeat phrase 0 points      Immunizations Immunization History  Administered Date(s) Administered   Fluad Quad(high Dose 65+) 09/30/2019, 08/12/2020, 09/29/2021   Influenza, High Dose Seasonal PF 09/19/2018, 09/06/2022   Influenza-Unspecified 08/05/2015, 09/02/2017   PFIZER Comirnaty(Gray Top)Covid-19 Tri-Sucrose Vaccine 08/05/2021   PFIZER(Purple Top)SARS-COV-2 Vaccination 12/15/2019, 01/05/2020, 09/13/2020   Pfizer Covid-19 Vaccine Bivalent Booster 65yr & up 09/09/2021   Pneumococcal Polysaccharide-23 08/29/2018   TDAP status: Due, Education has been provided regarding the importance of this vaccine. Advised may receive this vaccine at local pharmacy or Health Dept. Aware to provide a copy of the vaccination record if obtained from local pharmacy or Health Dept. Verbalized acceptance and understanding.  Shingrix Completed?: No.    Education has been provided regarding the importance of this vaccine. Patient has been advised to call insurance company to determine out of pocket expense if they have not yet received this vaccine. Advised may also receive vaccine at local pharmacy or Health Dept. Verbalized acceptance and understanding.  Screening Tests Health Maintenance  Topic Date Due   DTaP/Tdap/Td (1 - Tdap) Never done   OPHTHALMOLOGY  EXAM  01/18/2023 (Originally 12/30/2020)   COVID-19 Vaccine (5 - 2023-24 season) 02/03/2023 (Originally 08/04/2022)   Zoster Vaccines- Shingrix (1 of 2) 04/18/2023 (Originally 04/23/1945)   Pneumonia Vaccine 84+ Years old (2 of 2 - PCV) 07/05/2023  (Originally 08/30/2019)   DEXA SCAN  01/19/2024 (Originally 04/22/1993)   HEMOGLOBIN A1C  01/25/2023   FOOT EXAM  04/28/2023   Medicare Annual Wellness (AWV)  01/19/2024   INFLUENZA VACCINE  Completed   HPV VACCINES  Aged Out   Health Maintenance Health Maintenance Due  Topic Date Due   DTaP/Tdap/Td (1 - Tdap) Never done   Dexa scan- deferred per patient preference.   Lung Cancer Screening: (Low Dose CT Chest recommended if Age 14-80 years, 30 pack-year currently smoking OR have quit w/in 15years.) does not qualify.   Hepatitis C Screening: does not qualify.  Vision Screening: Recommended annual ophthalmology exams for early detection of glaucoma and other disorders of the eye.  Dental Screening: Recommended annual dental exams for proper oral hygiene  Community Resource Referral / Chronic Care Management: CRR required this visit?  No   CCM required this visit?  No      Plan:     I have personally reviewed and noted the following in the patient's chart:   Medical and social history Use of alcohol, tobacco or illicit drugs  Current medications and supplements including opioid prescriptions. Patient is not currently taking opioid prescriptions. Functional ability and status Nutritional status Physical activity Advanced directives List of other physicians Hospitalizations, surgeries, and ER visits in previous 12 months Vitals Screenings to include cognitive, depression, and falls Referrals and appointments  In addition, I have reviewed and discussed with patient certain preventive protocols, quality metrics, and best practice recommendations. A written personalized care plan for preventive services as well as general preventive health recommendations were provided to patient.     Leta Jungling, LPN   [phone]

## 2023-01-30 ENCOUNTER — Other Ambulatory Visit: Payer: Self-pay

## 2023-01-30 DIAGNOSIS — G500 Trigeminal neuralgia: Secondary | ICD-10-CM

## 2023-01-30 NOTE — Progress Notes (Unsigned)
Gabapentin refill pended for your review

## 2023-01-31 ENCOUNTER — Telehealth: Payer: Self-pay

## 2023-01-31 ENCOUNTER — Other Ambulatory Visit: Payer: Self-pay

## 2023-01-31 ENCOUNTER — Telehealth: Payer: Self-pay | Admitting: Family Medicine

## 2023-01-31 DIAGNOSIS — G500 Trigeminal neuralgia: Secondary | ICD-10-CM

## 2023-01-31 DIAGNOSIS — K219 Gastro-esophageal reflux disease without esophagitis: Secondary | ICD-10-CM

## 2023-01-31 NOTE — Telephone Encounter (Signed)
Prescription Request  01/31/2023  Is this a "Controlled Substance" medicine? Yes  LOV: 12/29/2022  What is the name of the medication or equipment? gabapentin (NEURONTIN) 100 MG capsule and pantoprazole (PROTONIX) 40 MG tablet  Have you contacted your pharmacy to request a refill? Yes   Which pharmacy would you like this sent to?  [facility] - [place], [place] - [address] [address] Phone: [phone] Fax: [phone]   Patient notified that their request is being sent to the clinical staff for review and that they should receive a response within 2 business days.   Please advise at [facility] [phone]

## 2023-01-31 NOTE — Telephone Encounter (Signed)
Orders pended for your review

## 2023-01-31 NOTE — Telephone Encounter (Signed)
We received a medical clearance form from [employer] via fax.  I sent a copy to Dr. Georges Mouse folder on the S drive and hand-delivered a copy to Jeralyn Bennett, [place].

## 2023-02-04 DIAGNOSIS — J449 Chronic obstructive pulmonary disease, unspecified: Secondary | ICD-10-CM | POA: Diagnosis not present

## 2023-02-05 NOTE — Telephone Encounter (Signed)
I was out of the office last week. I thought we faxed a similar form to them early in February. Please check to see if they got that previous form, though I have filled out the additional form. Please relay the below to the patient. Thanks.   Form filled out and signed.  Please fax.  The patient will need to stop her Eliquis 2 days prior to her dental extraction.  She can resume the Eliquis the day after the procedure as long as the dentist feels as though this is okay.  If she develops any leg swelling, chest pain, or shortness of breath while off the Eliquis she needs to be evaluated.

## 2023-02-05 NOTE — Telephone Encounter (Signed)
Called Patient and her son Jessica Erickson to let them know the signed consent was faxed to [place] and Dr. Ellen Henri recommendations.The patient will need to stop her Eliquis 2 days prior to her dental extraction.  She can resume the Eliquis the day after the procedure as long as the dentist feels as though this is okay.  If she develops any leg swelling, chest pain, or shortness of breath while off the Eliquis she needs to be evaluated.  [name] understands and is agreeable.

## 2023-02-05 NOTE — Telephone Encounter (Signed)
Alicia from Dr. Eugenie Birks dental office called in to check the status of pt clearance form that was faxed to [place]. Pt its having a procedure in 2 days and they want to go over some meds as well.

## 2023-02-06 ENCOUNTER — Encounter: Payer: Self-pay | Admitting: Family Medicine

## 2023-02-06 ENCOUNTER — Other Ambulatory Visit: Payer: Self-pay

## 2023-02-06 DIAGNOSIS — K219 Gastro-esophageal reflux disease without esophagitis: Secondary | ICD-10-CM

## 2023-02-06 DIAGNOSIS — G500 Trigeminal neuralgia: Secondary | ICD-10-CM

## 2023-02-06 MED ORDER — GABAPENTIN 100 MG PO CAPS: ORAL_CAPSULE | ORAL | 3 refills | Status: DC

## 2023-02-06 MED ORDER — PANTOPRAZOLE SODIUM 40 MG PO TBEC: 40.0000 mg | DELAYED_RELEASE_TABLET | Freq: Every day | ORAL | 3 refills | Status: DC

## 2023-02-08 ENCOUNTER — Encounter: Payer: Self-pay | Admitting: Podiatry

## 2023-02-08 ENCOUNTER — Ambulatory Visit: Payer: Medicare PPO | Admitting: Podiatry

## 2023-02-08 VITALS — BP 124/58 | HR 65

## 2023-02-08 DIAGNOSIS — B351 Tinea unguium: Secondary | ICD-10-CM

## 2023-02-08 DIAGNOSIS — L84 Corns and callosities: Secondary | ICD-10-CM

## 2023-02-08 DIAGNOSIS — D689 Coagulation defect, unspecified: Secondary | ICD-10-CM

## 2023-02-08 DIAGNOSIS — M79676 Pain in unspecified toe(s): Secondary | ICD-10-CM

## 2023-02-08 DIAGNOSIS — E1140 Type 2 diabetes mellitus with diabetic neuropathy, unspecified: Secondary | ICD-10-CM

## 2023-02-08 NOTE — Progress Notes (Signed)
This patient returns to my office for at risk foot care.  This patient requires this care by a professional since this patient will be at risk due to having history of DVT , coagulation defect and diabetes.  Patient is taking eliquis. This patient also has callus on her bunion right foot   This patient is unable to cut nails herself since the patient cannot reach her nails.These nails  and callus are painful walking and wearing shoes.  This patient presents for at risk foot care today.  General Appearance  Alert, conversant and in no acute stress.  Vascular  Dorsalis pedis and posterior tibial  pulses are weakly  palpable due to swelling  bilaterally.  Capillary return is within normal limits  bilaterally. Temperature is within normal limits  bilaterally.  Neurologic  Senn-Weinstein monofilament wire test within normal limits  bilaterally. Muscle power within normal limits bilaterally.  Nails Thick disfigured discolored nails with subungual debris  from hallux to fifth toes bilaterally. No evidence of bacterial infection or drainage bilaterally.  Orthopedic  No limitations of motion  feet .  No crepitus or effusions noted.  No bony pathology or digital deformities noted. HAV  Right.    Skin  normotropic skin with no porokeratosis noted bilaterally.  No signs of infections or ulcers noted.   Callus 1st MPJ right foot.  Onychomycosis  Pain in right toes  Pain in left toes  Callus right foot.  Consent was obtained for treatment procedures.   Mechanical debridement of nails 1-5  bilaterally performed with a nail nipper.  Filed with dremel without incident. Debridement of callus with dremel.   Return office visit   3 months                   Told patient to return for periodic foot care and evaluation due to potential at risk complications.   Gardiner Barefoot DPM

## 2023-02-09 NOTE — Telephone Encounter (Signed)
Error

## 2023-02-20 ENCOUNTER — Other Ambulatory Visit: Payer: Self-pay | Admitting: Family Medicine

## 2023-02-20 DIAGNOSIS — J449 Chronic obstructive pulmonary disease, unspecified: Secondary | ICD-10-CM

## 2023-02-27 ENCOUNTER — Telehealth: Payer: Self-pay | Admitting: Family Medicine

## 2023-02-27 ENCOUNTER — Telehealth: Payer: Self-pay

## 2023-02-27 DIAGNOSIS — F32A Depression, unspecified: Secondary | ICD-10-CM

## 2023-02-27 DIAGNOSIS — G4700 Insomnia, unspecified: Secondary | ICD-10-CM

## 2023-02-27 DIAGNOSIS — Z86718 Personal history of other venous thrombosis and embolism: Secondary | ICD-10-CM

## 2023-02-27 DIAGNOSIS — J309 Allergic rhinitis, unspecified: Secondary | ICD-10-CM

## 2023-02-27 NOTE — Telephone Encounter (Signed)
Prescription Request  02/27/2023  LOV: 12/29/2022  What is the name of the medication or equipment? cetirizine (ZYRTEC) 10 MG tablet, mirtazapine (REMERON) 15 MG tablet and apixaban (ELIQUIS) 2.5 MG TABS tablet   Have you contacted your pharmacy to request a refill? Yes   Which pharmacy would you like this sent to?   [facility] - [place], [place] - [address] [address] Phone: [phone] Fax: [phone]      Patient notified that their request is being sent to the clinical staff for review and that they should receive a response within 2 business days.   Please advise at Mobile [phone] (mobile)

## 2023-02-28 MED ORDER — MIRTAZAPINE 15 MG PO TABS: 15.0000 mg | ORAL_TABLET | Freq: Every day | ORAL | 3 refills | Status: DC

## 2023-02-28 MED ORDER — CETIRIZINE HCL 10 MG PO TABS: 10.0000 mg | ORAL_TABLET | Freq: Every day | ORAL | 3 refills | Status: DC

## 2023-02-28 MED ORDER — APIXABAN 2.5 MG PO TABS: 2.5000 mg | ORAL_TABLET | Freq: Two times a day (BID) | ORAL | 3 refills | Status: DC

## 2023-02-28 NOTE — Telephone Encounter (Signed)
Orders signed.

## 2023-02-28 NOTE — Telephone Encounter (Signed)
Eda (caregiver) called about medication refill for pt

## 2023-02-28 NOTE — Telephone Encounter (Signed)
Called Jessica Erickson Patient's son to let him know the prescriptions have been called in

## 2023-03-07 DIAGNOSIS — J449 Chronic obstructive pulmonary disease, unspecified: Secondary | ICD-10-CM | POA: Diagnosis not present

## 2023-03-20 ENCOUNTER — Ambulatory Visit
Admission: RE | Admit: 2023-03-20 | Discharge: 2023-03-20 | Disposition: A | Discharge status: Home / Self Care | Payer: Medicare PPO | Source: Ambulatory Visit | Attending: Emergency Medicine | Admitting: Emergency Medicine

## 2023-03-20 VITALS — BP 143/75 | HR 74 | Temp 97.8°F | Resp 18

## 2023-03-20 DIAGNOSIS — R350 Frequency of micturition: Secondary | ICD-10-CM | POA: Diagnosis not present

## 2023-03-20 DIAGNOSIS — R8290 Unspecified abnormal findings in urine: Secondary | ICD-10-CM | POA: Insufficient documentation

## 2023-03-20 LAB — POCT URINALYSIS DIP (MANUAL ENTRY)
Bilirubin, UA: NEGATIVE
Glucose, UA: NEGATIVE mg/dL
Ketones, POC UA: NEGATIVE mg/dL
Nitrite, UA: NEGATIVE
Protein Ur, POC: NEGATIVE mg/dL
Spec Grav, UA: 1.02 (ref 1.010–1.025)
Urobilinogen, UA: 0.2 E.U./dL
pH, UA: 6 (ref 5.0–8.0)

## 2023-03-20 MED ORDER — CEPHALEXIN 500 MG PO CAPS: 500.0000 mg | ORAL_CAPSULE | Freq: Two times a day (BID) | ORAL | 0 refills | Status: AC

## 2023-03-20 NOTE — ED Provider Notes (Signed)
Renaldo Fiddler    CSN: [id number] Arrival date & time: 03/20/23  1529      History   Chief Complaint Chief Complaint  Patient presents with   Urinary Frequency    Frequent urination - Entered by patient    HPI Jessica Erickson is a 89 y.o. female.  Accompanied by her caregiver and with telephone permission from her son who is her guardian, patient presents with urinary frequency today.  Patient communicates with writing board.  She denies fever or pain.  Her caregiver states her urine has been cloudy for the past couple of days and she has urinated 8 times today.  No fever, abdominal pain, hematuria, or other symptoms.  Her medical history includes chronic bladder infection, incomplete bladder emptying, bladder retention, CKD, diabetes.  Patient was hospitalized on 10/23/2022 for lower urinary tract infection.    The history is provided by the patient, a caregiver and medical records.    Past Medical History:  Diagnosis Date   Acoustic neuroma    Allergy    Asthma    Bilateral swelling of feet    and legs   Bladder infection    CAD (coronary artery disease)    Cataract    Change in voice    Compression fracture of body of thoracic vertebra    T12 09/18/15 MRI s/p fall    Constipation    COPD (chronic obstructive pulmonary disease)    previous CXR with chronic interstitial lung dz    CVA (cerebral vascular accident)    Depression    Diabetes    with neuropathy   Diabetes mellitus, type 2    Diarrhea    Double vision    DVT (deep venous thrombosis)    right leg 10/2015 was on coumadin off as of 2017/2018 ; s/p IVC filter   Enuresis    Eye pain, right    Fall    Fatty liver    09/15/15 also mildly dilated pancreatitic duct rec MRCP small sub cm cyst hemangioma speeln mild right hydronephrorossi and prox. hydroureter, kidney stones, mild scarring kidneys   Female stress incontinence    Flank pain    GERD (gastroesophageal reflux disease)    with small hiatal  hernia    Hard of hearing    Heart disease    History of kidney problems    Hyperlipidemia    mixed   Hypertension    Hypothyroidism, postsurgical    Impaired mobility and ADLs    uses rolling walker has caretaker 24/7 at home   Leg edema    Mixed incontinence urge and stress (female)(female)    Neuropathy    Osteoarthritis    DDD spine    Osteoporosis with fracture    T12 compression fracture   Photophobia    Pulmonary embolism    10/2015 off coumadin as of 04/2016   Pulmonary HTN    mild pulm HTN, echo 10/09/15 EF 55-60%grade 1 dd, RV systolic pressure increased    Recurrent UTI    Sinus pressure    Skin cancer    BCC jawline and scalp    Thyroid disease    follows [facility]   TIA (transient ischemic attack)    MRI 2009/2010 neg stroke    Trigeminal neuralgia    Dr. Cammie Mcgee s/p gamma knife x 2, on Tegretol since 2011/2012 no increase in dose >200 mg bid rec per family per neurology    Urinary frequency    Urinary,  incontinence, stress female    Dr Apolinar Junes urology     Patient Active Problem List   Diagnosis Date Noted   Dental infection 12/29/2022   Acute respiratory failure with hypoxia and hypercapnia 11/06/2022   Acute encephalopathy 11/06/2022   COPD with acute exacerbation 11/06/2022   Acute on chronic diastolic CHF (congestive heart failure) 11/06/2022   UTI (urinary tract infection) 11/03/2022   FTT (failure to thrive) in adult 10/25/2022   Nausea and vomiting 10/24/2022   Dyslipidemia 10/24/2022   Hypothyroidism 10/24/2022   GERD without esophagitis 10/24/2022   Hearing loss 09/01/2022   DNR (do not resuscitate) 08/01/2022   Iron deficiency 07/26/2022   Abnormal gait 10/19/2021   At high risk for falls 10/14/2021   Spinal stenosis of lumbar region 09/30/2021   Pain in both lower extremities 09/30/2021   Osteoarthritis 09/30/2021   Lumbar and sacral arthritis 09/30/2021   Pure hypercholesterolemia, unspecified 09/15/2021   Hyperlipidemia,  unspecified 09/15/2021   Coronary atherosclerosis 09/15/2021   Hearing decreased, bilateral 08/22/2021   Acute on chronic respiratory failure 08/14/2021   Shortness of breath 08/13/2021   CKD (chronic kidney disease), stage III 08/13/2021   History of CVA (cerebrovascular accident) 08/13/2021   Prediabetes 06/29/2021   Pulmonary artery hypertension 05/03/2021   Stage 4 chronic kidney disease 03/29/2021   Grade I diastolic dysfunction 08/16/2020   Obesity (BMI 30-39.9) 08/16/2020   Chronic kidney disease, stage 3b (HCC) 04/06/2020   Thrombocytopenia 04/06/2020   Seborrheic keratoses 04/06/2020   Hip pain 09/30/2019   History of deep vein thrombosis (DVT) of lower extremity 09/30/2019   Arthritis 09/30/2019   Fall 03/11/2019   Epistaxis 12/11/2018   History of DVT (deep vein thrombosis) 11/12/2018   Bilateral lower extremity edema 06/04/2018   Hearing loss of both ears 06/04/2018   Actinic keratosis 06/04/2018   Bronchiectasis 06/04/2018   Depression 05/17/2018   Dehydration 05/15/2018   Hypertension 05/03/2018   Lymphedema 05/02/2018   Iron deficiency anemia 05/02/2018   Physical deconditioning 05/02/2018   Drug interaction 05/02/2018   Supratherapeutic INR 05/02/2018   Presence of IVC filter 05/02/2018   Anemia 04/11/2018   Fatigue 04/11/2018   Moderate protein-calorie malnutrition 03/22/2018   Orthostasis 03/05/2018   Dizziness 03/04/2018   COPD (chronic obstructive pulmonary disease) 12/13/2017   Anxiety and depression 12/13/2017   Trigeminal neuralgia 12/13/2017   GERD (gastroesophageal reflux disease) 12/13/2017   Type 2 diabetes mellitus with stage 3b chronic kidney disease, without long-term current use of insulin 12/13/2017   Constipation 12/13/2017   Insomnia 12/13/2017   Hypothyroid 12/13/2017   Low back pain 10/08/2015   Collapsed vertebra, not elsewhere classified, thoracic region, initial encounter for fracture 09/20/2015   Basal cell carcinoma of scalp  04/06/2014   Fothergill's neuralgia 08/05/2013   Bladder infection, chronic 02/11/2013   Female genuine stress incontinence 02/11/2013   Incomplete bladder emptying 02/11/2013   Intrinsic sphincter deficiency 02/11/2013   Mixed incontinence 02/11/2013   Excessive urination at night 02/11/2013   Bladder retention 02/11/2013   FOM (frequency of micturition) 02/11/2013   Basal cell carcinoma of face 09/13/2011    Past Surgical History:  Procedure Laterality Date   APPENDECTOMY     as a child, open   BRAIN SURGERY     schwnnoma removal 1996    brain tumor surgery     BREAST SURGERY     breast bx   CATARACT EXTRACTION     CHOLECYSTECTOMY     EYE SURGERY  cataract   IVC FILTER PLACEMENT ([facility] HX)     Dr. Wyn Quaker 10/2015    LAPAROSCOPIC TUBAL LIGATION     MOHS SURGERY     scalp 04/2014    PERIPHERAL VASCULAR CATHETERIZATION N/A 10/11/2015   Procedure: IVC Filter Insertion;  Surgeon: Annice Needy, MD;  Location: [facility] INVASIVE CV LAB;  Service: Cardiovascular;  Laterality: N/A;   PERIPHERAL VASCULAR THROMBECTOMY Bilateral 03/29/2018   Procedure: PERIPHERAL VASCULAR THROMBECTOMY;  Surgeon: Annice Needy, MD;  Location: [facility] INVASIVE CV LAB;  Service: Cardiovascular;  Laterality: Bilateral;   PUBOVAGINAL SLING     THROAT SURGERY     THYROID SURGERY     tumor around vocal cords    TOOTH EXTRACTION     winter 2018    TOTAL THYROIDECTOMY  1976    OB History   No obstetric history on file.      Home Medications    Prior to Admission medications   Medication Sig Start Date End Date Taking? Authorizing Provider  cephALEXin (KEFLEX) 500 MG capsule Take 1 capsule (500 mg total) by mouth 2 (two) times daily for 5 days. 03/20/23 03/25/23 Yes Mickie Bail, NP  Accu-Chek Softclix Lancets lancets Use as instructed 10/11/22   Worthy Rancher B, FNP  acetaminophen (TYLENOL) 500 MG tablet Take 2 tablets (1,000 mg total) by mouth 2 (two) times daily as needed. 09/29/21   McLean-Scocuzza, Pasty Spillers,  MD  albuterol (PROVENTIL) (2.5 MG/3ML) 0.083% nebulizer solution Take 3 mLs (2.5 mg total) by nebulization in the morning, at noon, and at bedtime. 01/25/22   McLean-Scocuzza, Pasty Spillers, MD  apixaban (ELIQUIS) 2.5 MG TABS tablet Take 1 tablet (2.5 mg total) by mouth 2 (two) times daily. 02/28/23   Glori Luis, MD  atorvastatin (LIPITOR) 40 MG tablet Take 1 tablet (40 mg total) by mouth daily at 6 PM. Generic ok 01/25/22   McLean-Scocuzza, Pasty Spillers, MD  azithromycin (ZITHROMAX) 250 MG tablet Take 250 mg by mouth as directed. 12/26/22   [provider]  bisacodyl (DULCOLAX) 5 MG EC tablet Take 5 mg by mouth daily as needed for moderate constipation.    [provider]  Blood Glucose Monitoring Suppl (ACCU-CHEK AVIVA PLUS) w/Device KIT Use as directed 09/22/21   McLean-Scocuzza, Pasty Spillers, MD  calcium-vitamin D (OSCAL WITH D) 500-5 MG-MCG tablet Take 1 tablet by mouth 2 (two) times daily. Unable to find former dose 600ca/400 vitamin D inform patient dose change Oscal 08/23/22   McLean-Scocuzza, Pasty Spillers, MD  cetirizine (ZYRTEC) 10 MG tablet Take 1 tablet (10 mg total) by mouth daily. 02/28/23   Glori Luis, MD  feeding supplement, GLUCERNA SHAKE, (GLUCERNA SHAKE) LIQD Take 237 mLs by mouth 2 (two) times daily between meals. 08/04/21   Danford, Earl Lites, MD  Fluticasone-Umeclidin-Vilant (TRELEGY ELLIPTA) 100-62.5-25 MCG/ACT AEPB INHALE ONE PUFF DAILY. RINSE MOUTH AFTERUSE. STOP SYMBICORT & SPIRIVA 04/19/22   McLean-Scocuzza, Pasty Spillers, MD  furosemide (LASIX) 40 MG tablet TAKE ONE TABLET (40MG ) DAILY. MAY TAKE ASECOND DOSE OF 40MG  IF NEEDED AT LUNCH FOR 5 DAYS 08/02/22   McLean-Scocuzza, Pasty Spillers, MD  gabapentin (NEURONTIN) 100 MG capsule TAKE 2 CAPSULES BY MOUTH 3 TIMES DAILY. 02/06/23   Glori Luis, MD  glucose blood test strip Use to test blood sugar twice daily. 10/11/22   Eulis Foster, FNP  guaiFENesin (MUCINEX) 600 MG 12 hr tablet Take 2 tablets (1,200 mg total) by mouth 2  (two) times daily as needed for cough  or to loosen phlegm. Patient taking differently: Take 600 mg by mouth 2 (two) times daily as needed for cough or to loosen phlegm. 05/05/21   Coralyn Helling, MD  ipratropium-albuterol (DUONEB) 0.5-2.5 (3) MG/3ML SOLN TAKE 3 ML BY NEBULIZATION EVERY 4 HOURS AS NEEDED 02/20/23   Glori Luis, MD  levothyroxine (SYNTHROID) 175 MCG tablet Take 1 tablet (175 mcg total) by mouth daily before breakfast. D/c 200 mcg dose Do not take with other medications or vitamins 08/01/22   McLean-Scocuzza, Pasty Spillers, MD  Lidocaine-Menthol (ICY HOT MAX LIDOCAINE) 4-1 % CREA Apply 1 application. topically 3 (three) times daily with meals as needed. Dispense cream or spray pt preference 02/16/22   McLean-Scocuzza, Pasty Spillers, MD  Melatonin 10 MG TABS Take 10 mg by mouth at bedtime. 11/07/21   McLean-Scocuzza, Pasty Spillers, MD  mirtazapine (REMERON) 15 MG tablet Take 1 tablet (15 mg total) by mouth at bedtime. 02/28/23   Glori Luis, MD  Multiple Vitamin (MULTIVITAMIN WITH MINERALS) TABS tablet Take 1 tablet by mouth daily.     [provider]  pantoprazole (PROTONIX) 40 MG tablet Take 1 tablet (40 mg total) by mouth daily. 30 minutes before lunch or dinner 02/06/23   Glori Luis, MD  polyethylene glycol powder (GLYCOLAX/MIRALAX) 17 GM/SCOOP powder Take 17 g by mouth daily. 04/06/20   McLean-Scocuzza, Pasty Spillers, MD  polyvinyl alcohol (LIQUIFILM TEARS) 1.4 % ophthalmic solution Place 1 drop into both eyes at bedtime. 08/04/21   Danford, Earl Lites, MD  predniSONE (DELTASONE) 20 MG tablet Take 2 tablets (40 mg total) by mouth daily with breakfast. 11/03/22   Glori Luis, MD    Family History Family History  Problem Relation Age of Onset   Heart disease Mother    Diabetes Father    Cancer Daughter        breast ca x 2 s/p mastectomy     Social History Social History   Tobacco Use   Smoking status: Former    Packs/day: 0.50    Years: 20.00    Additional pack years:  0.00    Total pack years: 10.00    Types: Cigarettes    Quit date: 09/20/1995    Years since quitting: 27.5   Smokeless tobacco: Never   Tobacco comments:    quit 1996 smoked 20 years max 8 cig qd   Vaping Use   Vaping Use: Never used  Substance Use Topics   Alcohol use: No   Drug use: No     Allergies   Penicillins, Sulfa antibiotics, Amitiza [lubiprostone], Aspirin, and Penicillin g   Review of Systems Review of Systems  Constitutional:  Negative for chills and fever.  Gastrointestinal:  Negative for abdominal pain and vomiting.  Genitourinary:  Positive for frequency. Negative for dysuria, flank pain and hematuria.  All other systems reviewed and are negative.    Physical Exam Triage Vital Signs ED Triage Vitals  Enc Vitals Group     BP      Pulse      Resp      Temp      Temp src      SpO2      Weight      Height      Head Circumference      Peak Flow      Pain Score      Pain Loc      Pain Edu?      Excl. in GC?  No data found.  Updated Vital Signs BP (!) 143/75 (BP Location: Left Arm)   Pulse 74   Temp 97.8 F (36.6 C) (Temporal)   Resp 18   LMP  (LMP Unknown)   SpO2 94%   Visual Acuity Right Eye Distance:   Left Eye Distance:   Bilateral Distance:    Right Eye Near:   Left Eye Near:    Bilateral Near:     Physical Exam Vitals and nursing note reviewed.  Constitutional:      General: She is not in acute distress.    Appearance: She is well-developed. She is not ill-appearing.  HENT:     Mouth/Throat:     Mouth: Mucous membranes are moist.  Cardiovascular:     Rate and Rhythm: Normal rate and regular rhythm.     Heart sounds: Normal heart sounds.  Pulmonary:     Effort: Pulmonary effort is normal. No respiratory distress.     Breath sounds: Normal breath sounds.  Abdominal:     General: Bowel sounds are normal.     Palpations: Abdomen is soft.     Tenderness: There is no abdominal tenderness. There is no right CVA tenderness,  left CVA tenderness, guarding or rebound.  Musculoskeletal:     Cervical back: Neck supple.  Skin:    General: Skin is warm and dry.  Neurological:     Mental Status: She is alert.      UC Treatments / Results  Labs (all labs ordered are listed, but only abnormal results are displayed) Labs Reviewed  POCT URINALYSIS DIP (MANUAL ENTRY) - Abnormal; Notable for the following components:      Result Value   Clarity, UA cloudy (*)    Blood, UA trace-intact (*)    Leukocytes, UA Small (1+) (*)    All other components within normal limits  URINE CULTURE    EKG   Radiology No results found.  Procedures Procedures (including critical care time)  Medications Ordered in UC Medications - No data to display  Initial Impression / Assessment and Plan / UC Course  I have reviewed the triage vital signs and the nursing notes.  Pertinent labs & imaging results that were available during my care of the patient were reviewed by me and considered in my medical decision making (see chart for details).    Urinary frequency, cloudy urine.  Treating with Keflex (patient has taken cephalosporins in the past without problem). Urine culture pending. Discussed with caregiver that we will call if the urine culture shows the need to change or discontinue the antibiotic. Instructed her to follow-up with the patient's PCP.  Caregiver agrees to plan of care.     Final Clinical Impressions(s) / UC Diagnoses   Final diagnoses:  Urinary frequency  Cloudy urine     Discharge Instructions      Take the antibiotic as directed.  The urine culture is pending.  We will call you if it shows the need to change or discontinue your antibiotic.    Follow up with your primary care provider.        ED Prescriptions     Medication Sig Dispense Auth. Provider   cephALEXin (KEFLEX) 500 MG capsule Take 1 capsule (500 mg total) by mouth 2 (two) times daily for 5 days. 10 capsule Mickie Bail, NP       PDMP not reviewed this encounter.   Mickie Bail, NP 03/20/23 [phone]

## 2023-03-20 NOTE — ED Triage Notes (Signed)
Patient presents to [facility] for UTI. Caregiver states urinary freq noted today. Hx of UTIs.

## 2023-03-20 NOTE — Discharge Instructions (Addendum)
Take the antibiotic as directed.  The urine culture is pending.  We will call you if it shows the need to change or discontinue your antibiotic.    Follow up with your primary care provider.    

## 2023-03-22 LAB — URINE CULTURE: Culture: 80000 — AB

## 2023-03-23 ENCOUNTER — Ambulatory Visit: Payer: Medicare PPO | Admitting: Family Medicine

## 2023-03-26 ENCOUNTER — Encounter: Payer: Self-pay | Admitting: Family Medicine

## 2023-03-27 MED ORDER — NITROFURANTOIN MONOHYD MACRO 100 MG PO CAPS: 100.0000 mg | ORAL_CAPSULE | Freq: Two times a day (BID) | ORAL | 0 refills | Status: DC

## 2023-03-27 NOTE — Telephone Encounter (Signed)
Spoke to Cletis Athens Patient's son and got her scheduled for 03/28/23 at 3:15 for possible UTI

## 2023-03-28 ENCOUNTER — Ambulatory Visit: Payer: Medicare PPO | Admitting: Family Medicine

## 2023-03-28 ENCOUNTER — Encounter: Payer: Self-pay | Admitting: Family Medicine

## 2023-03-28 VITALS — BP 114/68 | HR 69 | Temp 97.9°F | Ht 67.0 in | Wt 189.4 lb

## 2023-03-28 DIAGNOSIS — N3000 Acute cystitis without hematuria: Secondary | ICD-10-CM

## 2023-03-28 NOTE — Assessment & Plan Note (Signed)
Patient with UTI symptoms and positive urine culture.  Creatinine clearance previously calculated to be 35.  This is in a range where Macrobid can be used for short durations.  Patient will continue Macrobid 1 tablet twice daily for a total of 5 days.  Plan to recheck urine in 1 month.  If she has recurrent symptoms after stopping the Macrobid she will let us know.  Discussed adequate hygiene measures.

## 2023-03-28 NOTE — Progress Notes (Signed)
[name], MD Phone: [phone]  Jessica Erickson is a 89 y.o. female who presents today for same day visit.   UTI: onset of symptoms over a week ago Dysuria- no  Frequency- yes   Urgency- yes   Hematuria- no   Fever- no  Abd pain- no   Culture through urgent care revealed Enterococcus.  Sensitive to Macrobid.   Social History   Tobacco Use  Smoking Status Former   Packs/day: 0.50   Years: 20.00   Additional pack years: 0.00   Total pack years: 10.00   Types: Cigarettes   Quit date: 09/20/1995   Years since quitting: 27.5  Smokeless Tobacco Never  Tobacco Comments   quit 1996 smoked 20 years max 8 cig qd     Current Outpatient Medications on File Prior to Visit  Medication Sig Dispense Refill   Accu-Chek Softclix Lancets lancets Use as instructed 100 each 12   acetaminophen (TYLENOL) 500 MG tablet Take 2 tablets (1,000 mg total) by mouth 2 (two) times daily as needed. 360 tablet 3   albuterol (PROVENTIL) (2.5 MG/3ML) 0.083% nebulizer solution Take 3 mLs (2.5 mg total) by nebulization in the morning, at noon, and at bedtime. 360 mL 11   apixaban (ELIQUIS) 2.5 MG TABS tablet Take 1 tablet (2.5 mg total) by mouth 2 (two) times daily. 180 tablet 3   atorvastatin (LIPITOR) 40 MG tablet Take 1 tablet (40 mg total) by mouth daily at 6 PM. Generic ok 90 tablet 3   azithromycin (ZITHROMAX) 250 MG tablet Take 250 mg by mouth as directed.     bisacodyl (DULCOLAX) 5 MG EC tablet Take 5 mg by mouth daily as needed for moderate constipation.     Blood Glucose Monitoring Suppl (ACCU-CHEK AVIVA PLUS) w/Device KIT Use as directed 1 kit 0   calcium-vitamin D (OSCAL WITH D) 500-5 MG-MCG tablet Take 1 tablet by mouth 2 (two) times daily. Unable to find former dose 600ca/400 vitamin D inform patient dose change Oscal 180 tablet 3   cetirizine (ZYRTEC) 10 MG tablet Take 1 tablet (10 mg total) by mouth daily. 90 tablet 3   feeding supplement, GLUCERNA SHAKE, (GLUCERNA SHAKE) LIQD Take 237  mLs by mouth 2 (two) times daily between meals.  0   Fluticasone-Umeclidin-Vilant (TRELEGY ELLIPTA) 100-62.5-25 MCG/ACT AEPB INHALE ONE PUFF DAILY. RINSE MOUTH AFTERUSE. STOP SYMBICORT & SPIRIVA 60 each 11   furosemide (LASIX) 40 MG tablet TAKE ONE TABLET ( ) DAILY. MAY TAKE ASECOND DOSE OF  IF NEEDED AT LUNCH FOR 5 DAYS 120 tablet 3   gabapentin (NEURONTIN) 100 MG capsule TAKE 2 CAPSULES BY MOUTH 3 TIMES DAILY. 504 capsule 3   glucose blood test strip Use to test blood sugar twice daily. 200 each 3   guaiFENesin (MUCINEX) 600 MG 12 hr tablet Take 2 tablets (1,200 mg total) by mouth 2 (two) times daily as needed for cough or to loosen phlegm. (Patient taking differently: Take 600 mg by mouth 2 (two) times daily as needed for cough or to loosen phlegm.)     ipratropium-albuterol (DUONEB) 0.5-2.5 (3) MG/3ML SOLN TAKE 3 ML BY NEBULIZATION EVERY 4 HOURS AS NEEDED 360 mL 11   levothyroxine (SYNTHROID) 175 MCG tablet Take 1 tablet (175 mcg total) by mouth daily before breakfast. D/c 200 mcg dose Do not take with other medications or vitamins 90 tablet 3   Lidocaine-Menthol (ICY HOT MAX LIDOCAINE) 4-1 % CREA Apply 1 application. topically 3 (three) times daily with meals as needed. Dispense  cream or spray pt preference 120 g 11   Melatonin 10 MG TABS Take 10 mg by mouth at bedtime. 90 tablet 3   mirtazapine (REMERON) 15 MG tablet Take 1 tablet (15 mg total) by mouth at bedtime. 90 tablet 3   Multiple Vitamin (MULTIVITAMIN WITH MINERALS) TABS tablet Take 1 tablet by mouth daily.      nitrofurantoin, macrocrystal-monohydrate, (MACROBID) 100 MG capsule Take 1 capsule (100 mg total) by mouth 2 (two) times daily. 10 capsule 0   pantoprazole (PROTONIX) 40 MG tablet Take 1 tablet (40 mg total) by mouth daily. 30 minutes before lunch or dinner 90 tablet 3   polyethylene glycol powder (GLYCOLAX/MIRALAX) 17 GM/SCOOP powder Take 17 g by mouth daily. 255 g 11   polyvinyl alcohol (LIQUIFILM TEARS) 1.4 % ophthalmic  solution Place 1 drop into both eyes at bedtime. 15 mL 0   predniSONE (DELTASONE) 20 MG tablet Take 2 tablets (40 mg total) by mouth daily with breakfast. 10 tablet 0   No current facility-administered medications on file prior to visit.     ROS see history of present illness  Objective  Physical Exam Vitals:   03/28/23 1543  BP: 114/68  Pulse: 69  Temp: 97.9 F (36.6 C)  SpO2: 98%    BP Readings from Last 3 Encounters:  03/28/23 114/68  03/20/23 (!) 143/75  02/08/23 (!) 124/58   Wt Readings from Last 3 Encounters:  03/28/23 189 lb 6.4 oz (85.9 kg)  01/18/23 192 lb (87.1 kg)  12/29/22 192 lb 3.2 oz (87.2 kg)    Physical Exam Abdominal:     General: Bowel sounds are normal. There is no distension.     Palpations: Abdomen is soft.     Tenderness: There is no abdominal tenderness.      Assessment/Plan: Please see individual problem list.  Acute cystitis without hematuria Assessment & Plan: Patient with UTI symptoms and positive urine culture.  Creatinine clearance previously calculated to be 35.  This is in a range where Macrobid can be used for short durations.  Patient will continue Macrobid 1 tablet twice daily for a total of 5 days.  Plan to recheck urine in 1 month.  If she has recurrent symptoms after stopping the Macrobid she will let us know.  Discussed adequate hygiene measures.  Orders: -     POCT urinalysis dipstick; Future   Return in about 4 weeks (around 04/25/2023), or if symptoms worsen or fail to improve, for Recheck urine lab visit.   [name], MD [facility]

## 2023-03-28 NOTE — Patient Instructions (Signed)
Nice to see you. Please finish your course of Macrobid.  This should be safe to use for the short duration that she will be on it with your current kidney function. Please make sure that you are wiping front to back after having a bowel movement and front to back after urinating. Please use depends and/or a liner to catch any urine from incontinence as opposed to using toilet paper. Please make sure you are changing any depends or liners frequently if they are wet or soiled.

## 2023-03-29 ENCOUNTER — Encounter: Payer: Self-pay | Admitting: Family Medicine

## 2023-04-02 ENCOUNTER — Ambulatory Visit: Payer: Medicare PPO | Admitting: Family Medicine

## 2023-04-02 ENCOUNTER — Encounter: Payer: Self-pay | Admitting: Family Medicine

## 2023-04-02 VITALS — BP 134/82 | HR 67 | Temp 98.1°F | Ht 67.0 in | Wt 188.0 lb

## 2023-04-02 DIAGNOSIS — R55 Syncope and collapse: Secondary | ICD-10-CM

## 2023-04-02 DIAGNOSIS — R7303 Prediabetes: Secondary | ICD-10-CM | POA: Diagnosis not present

## 2023-04-02 DIAGNOSIS — Z86718 Personal history of other venous thrombosis and embolism: Secondary | ICD-10-CM

## 2023-04-02 DIAGNOSIS — N3000 Acute cystitis without hematuria: Secondary | ICD-10-CM

## 2023-04-02 DIAGNOSIS — E039 Hypothyroidism, unspecified: Secondary | ICD-10-CM | POA: Diagnosis not present

## 2023-04-02 DIAGNOSIS — F419 Anxiety disorder, unspecified: Secondary | ICD-10-CM

## 2023-04-02 DIAGNOSIS — H6122 Impacted cerumen, left ear: Secondary | ICD-10-CM

## 2023-04-02 DIAGNOSIS — F32A Depression, unspecified: Secondary | ICD-10-CM | POA: Diagnosis not present

## 2023-04-02 HISTORY — DX: Syncope and collapse: R55

## 2023-04-02 NOTE — Patient Instructions (Addendum)
Nice to see you. We will get lab work today and contact you with the results. 

## 2023-04-02 NOTE — Assessment & Plan Note (Signed)
They report improvement.  Plan to recheck urine in a few weeks.  They can contact us if she has any recurrent symptoms.

## 2023-04-02 NOTE — Assessment & Plan Note (Signed)
Check A1c. 

## 2023-04-02 NOTE — Assessment & Plan Note (Signed)
Chronic issue.  Check TSH.  Continue Synthroid 175 mcg daily. 

## 2023-04-02 NOTE — Assessment & Plan Note (Signed)
I suspect the episode the patient had on the toilet is likely a vasovagal episode.  She will monitor for recurrence and let somebody know if it does recur.

## 2023-04-02 NOTE — Assessment & Plan Note (Signed)
Minimal cerumen in left ear canal.  Monitor.

## 2023-04-02 NOTE — Progress Notes (Signed)
Jessica Alar, MD Phone: [phone]  Jessica Erickson is a 89 y.o. female who presents today for follow-up.  UTI: Patient is finished her antibiotics.  Her caregiver reports her urine has improved.  History of DVT: Continues on Eliquis.  She has an IVC filter in place.  No chest pain.  No change to chronic dyspnea.  No change in chronic lower extremity swelling.  Anxiety/depression: Patient reports mild issues with this when she thinks about her family.  She notes mirtazapine has been helpful.  No SI.  Hypothyroidism: Patient is taking Synthroid.  No recent skin changes.  No heat or cold intolerance.  Her caregiver reports she is always digging in her left ear.  Patient reports an episode about a month ago where she was sitting on the toilet having a bowel movement and then she felt a curtain fall over her body by the time it went from her head to her feet it was gone and she has not had any recurrent issues.  It did not involve her vision.  She did not tell anybody that this happened until now.  Social History   Tobacco Use  Smoking Status Former   Packs/day: 0.50   Years: 20.00   Additional pack years: 0.00   Total pack years: 10.00   Types: Cigarettes   Quit date: 09/20/1995   Years since quitting: 27.5  Smokeless Tobacco Never  Tobacco Comments   quit 1996 smoked 20 years max 8 cig qd     Current Outpatient Medications on File Prior to Visit  Medication Sig Dispense Refill   Accu-Chek Softclix Lancets lancets Use as instructed 100 each 12   acetaminophen (TYLENOL) 500 MG tablet Take 2 tablets (1,000 mg total) by mouth 2 (two) times daily as needed. 360 tablet 3   albuterol (PROVENTIL) (2.5 MG/3ML) 0.083% nebulizer solution Take 3 mLs (2.5 mg total) by nebulization in the morning, at noon, and at bedtime. 360 mL 11   apixaban (ELIQUIS) 2.5 MG TABS tablet Take 1 tablet (2.5 mg total) by mouth 2 (two) times daily. 180 tablet 3   atorvastatin (LIPITOR) 40 MG tablet Take 1  tablet (40 mg total) by mouth daily at 6 PM. Generic ok 90 tablet 3   azithromycin (ZITHROMAX) 250 MG tablet Take 250 mg by mouth as directed.     bisacodyl (DULCOLAX) 5 MG EC tablet Take 5 mg by mouth daily as needed for moderate constipation.     Blood Glucose Monitoring Suppl (ACCU-CHEK AVIVA PLUS) w/Device KIT Use as directed 1 kit 0   calcium-vitamin D (OSCAL WITH D) 500-5 MG-MCG tablet Take 1 tablet by mouth 2 (two) times daily. Unable to find former dose 600ca/400 vitamin D inform patient dose change Oscal 180 tablet 3   cetirizine (ZYRTEC) 10 MG tablet Take 1 tablet (10 mg total) by mouth daily. 90 tablet 3   feeding supplement, GLUCERNA SHAKE, (GLUCERNA SHAKE) LIQD Take 237 mLs by mouth 2 (two) times daily between meals.  0   Fluticasone-Umeclidin-Vilant (TRELEGY ELLIPTA) 100-62.5-25 MCG/ACT AEPB INHALE ONE PUFF DAILY. RINSE MOUTH AFTERUSE. STOP SYMBICORT & SPIRIVA 60 each 11   furosemide (LASIX) 40 MG tablet TAKE ONE TABLET (40MG ) DAILY. MAY TAKE ASECOND DOSE OF 40MG  IF NEEDED AT LUNCH FOR 5 DAYS 120 tablet 3   gabapentin (NEURONTIN) 100 MG capsule TAKE 2 CAPSULES BY MOUTH 3 TIMES DAILY. 504 capsule 3   glucose blood test strip Use to test blood sugar twice daily. 200 each 3   guaiFENesin (  MUCINEX) 600 MG 12 hr tablet Take 2 tablets (1,200 mg total) by mouth 2 (two) times daily as needed for cough or to loosen phlegm. (Patient taking differently: Take 600 mg by mouth 2 (two) times daily as needed for cough or to loosen phlegm.)     ipratropium-albuterol (DUONEB) 0.5-2.5 (3) MG/3ML SOLN TAKE 3 ML BY NEBULIZATION EVERY 4 HOURS AS NEEDED 360 mL 11   levothyroxine (SYNTHROID) 175 MCG tablet Take 1 tablet (175 mcg total) by mouth daily before breakfast. D/c 200 mcg dose Do not take with other medications or vitamins 90 tablet 3   Lidocaine-Menthol (ICY HOT MAX LIDOCAINE) 4-1 % CREA Apply 1 application. topically 3 (three) times daily with meals as needed. Dispense cream or spray pt preference 120  g 11   Melatonin 10 MG TABS Take 10 mg by mouth at bedtime. 90 tablet 3   mirtazapine (REMERON) 15 MG tablet Take 1 tablet (15 mg total) by mouth at bedtime. 90 tablet 3   Multiple Vitamin (MULTIVITAMIN WITH MINERALS) TABS tablet Take 1 tablet by mouth daily.      nitrofurantoin, macrocrystal-monohydrate, (MACROBID) 100 MG capsule Take 1 capsule (100 mg total) by mouth 2 (two) times daily. 10 capsule 0   pantoprazole (PROTONIX) 40 MG tablet Take 1 tablet (40 mg total) by mouth daily. 30 minutes before lunch or dinner 90 tablet 3   polyethylene glycol powder (GLYCOLAX/MIRALAX) 17 GM/SCOOP powder Take 17 g by mouth daily. 255 g 11   polyvinyl alcohol (LIQUIFILM TEARS) 1.4 % ophthalmic solution Place 1 drop into both eyes at bedtime. 15 mL 0   predniSONE (DELTASONE) 20 MG tablet Take 2 tablets (40 mg total) by mouth daily with breakfast. 10 tablet 0   No current facility-administered medications on file prior to visit.     ROS see history of present illness  Objective  Physical Exam Vitals:   04/02/23 1430  BP: 134/82  Pulse: 67  Temp: 98.1 F (36.7 C)  SpO2: 95%    BP Readings from Last 3 Encounters:  04/02/23 134/82  03/28/23 114/68  03/20/23 (!) 143/75   Wt Readings from Last 3 Encounters:  04/02/23 188 lb (85.3 kg)  03/28/23 189 lb 6.4 oz (85.9 kg)  01/18/23 192 lb (87.1 kg)    Physical Exam Constitutional:      General: She is not in acute distress.    Appearance: She is not diaphoretic.  HENT:     Right Ear: Tympanic membrane and ear canal normal.     Left Ear: Tympanic membrane and ear canal normal.     Ears:     Comments: Very small amount of cerumen in the ear canal on the left Cardiovascular:     Rate and Rhythm: Normal rate and regular rhythm.     Heart sounds: Normal heart sounds.  Pulmonary:     Effort: Pulmonary effort is normal.     Breath sounds: Normal breath sounds.  Skin:    General: Skin is warm and dry.  Neurological:     Mental Status: She  is alert.      Assessment/Plan: Please see individual problem list.  Hypothyroidism, unspecified type Assessment & Plan: Chronic issue.  Check TSH.  Continue Synthroid 175 mcg daily.  Orders: -     TSH  Prediabetes Assessment & Plan: Check A1c.  Orders: -     Hemoglobin A1c  History of DVT (deep vein thrombosis) Assessment & Plan: Chronic issue.  Continue Eliquis 2.5 mg twice daily.  Anxiety and depression Assessment & Plan: Chronic issue.  Adequately controlled with mirtazapine.  Continue mirtazapine 15 mg daily.   Vaso vagal episode Assessment & Plan: I suspect the episode the patient had on the toilet is likely a vasovagal episode.  She will monitor for recurrence and let somebody know if it does recur.   Cerumen in left ear canal Assessment & Plan: Minimal cerumen in left ear canal.  Monitor.   Acute cystitis without hematuria Assessment & Plan: They report improvement.  Plan to recheck urine in a few weeks.  They can contact us if she has any recurrent symptoms.    Return in about 3 months (around 07/02/2023).   Jessica Alar, MD [facility]

## 2023-04-02 NOTE — Assessment & Plan Note (Signed)
Chronic issue.  Adequately controlled with mirtazapine.  Continue mirtazapine 15 mg daily.

## 2023-04-02 NOTE — Assessment & Plan Note (Signed)
Chronic issue.  Continue Eliquis 2.5 mg twice daily.

## 2023-04-03 LAB — HEMOGLOBIN A1C: Hgb A1c MFr Bld: 6 % (ref 4.6–6.5)

## 2023-04-03 LAB — TSH: TSH: 0.22 u[IU]/mL — ABNORMAL LOW (ref 0.35–5.50)

## 2023-04-04 ENCOUNTER — Other Ambulatory Visit: Payer: Self-pay | Admitting: Family Medicine

## 2023-04-04 DIAGNOSIS — E039 Hypothyroidism, unspecified: Secondary | ICD-10-CM

## 2023-04-04 MED ORDER — LEVOTHYROXINE SODIUM 150 MCG PO TABS: 150.0000 ug | ORAL_TABLET | Freq: Every day | ORAL | 1 refills | Status: DC

## 2023-04-06 DIAGNOSIS — J449 Chronic obstructive pulmonary disease, unspecified: Secondary | ICD-10-CM | POA: Diagnosis not present

## 2023-04-17 ENCOUNTER — Other Ambulatory Visit: Payer: Self-pay | Admitting: Pharmacist

## 2023-04-17 DIAGNOSIS — E119 Type 2 diabetes mellitus without complications: Secondary | ICD-10-CM

## 2023-04-17 NOTE — Progress Notes (Signed)
Pharmacy Quality Measure Review  This patient is appearing on the insurance-provided list for being at risk of failing the adherence measure for cholesterol medications this calendar year.   Medication: atorvastatin 40 mg daily Last fill date: confirmed with [facility]. Atorvastatin prescription expired 01/2022.   Contacted patient's guardian, Cletis Athens. He asked that I call the caregiver, Kendal Hymen, to confirm what is in her current packs.   Will route to provider to place refill request. Order pended for review.   Catie Eppie Gibson, PharmD, BCACP, CPP [facility] [phone]

## 2023-04-18 MED ORDER — ATORVASTATIN CALCIUM 40 MG PO TABS: 40.0000 mg | ORAL_TABLET | Freq: Every day | ORAL | 3 refills | Status: DC

## 2023-04-25 ENCOUNTER — Other Ambulatory Visit (INDEPENDENT_AMBULATORY_CARE_PROVIDER_SITE_OTHER): Payer: Medicare PPO

## 2023-04-25 DIAGNOSIS — N3000 Acute cystitis without hematuria: Secondary | ICD-10-CM | POA: Diagnosis not present

## 2023-04-25 DIAGNOSIS — E039 Hypothyroidism, unspecified: Secondary | ICD-10-CM

## 2023-04-25 LAB — POCT URINALYSIS DIPSTICK
Bilirubin, UA: NEGATIVE
Blood, UA: NEGATIVE
Glucose, UA: POSITIVE — AB
Ketones, UA: NEGATIVE
Nitrite, UA: NEGATIVE
Protein, UA: NEGATIVE
Spec Grav, UA: 1.01 (ref 1.010–1.025)
Urobilinogen, UA: 0.2 E.U./dL
pH, UA: 6.5 (ref 5.0–8.0)

## 2023-04-25 LAB — TSH: TSH: 0.73 u[IU]/mL (ref 0.35–5.50)

## 2023-05-07 DIAGNOSIS — J449 Chronic obstructive pulmonary disease, unspecified: Secondary | ICD-10-CM | POA: Diagnosis not present

## 2023-05-14 ENCOUNTER — Telehealth: Payer: Self-pay | Admitting: *Deleted

## 2023-05-14 ENCOUNTER — Ambulatory Visit: Payer: Medicare PPO | Admitting: Podiatry

## 2023-05-14 VITALS — BP 135/62

## 2023-05-14 DIAGNOSIS — E1140 Type 2 diabetes mellitus with diabetic neuropathy, unspecified: Secondary | ICD-10-CM | POA: Diagnosis not present

## 2023-05-14 DIAGNOSIS — B351 Tinea unguium: Secondary | ICD-10-CM | POA: Diagnosis not present

## 2023-05-14 DIAGNOSIS — L84 Corns and callosities: Secondary | ICD-10-CM | POA: Diagnosis not present

## 2023-05-14 DIAGNOSIS — M79676 Pain in unspecified toe(s): Secondary | ICD-10-CM

## 2023-05-14 NOTE — Telephone Encounter (Signed)
Pt scheduled for labs on Wed. Lab note says TSH. Pt has a TSH on 04/25/23.  Please advise

## 2023-05-14 NOTE — Telephone Encounter (Signed)
Patient does not need lab work this week. She had this done at the end of May. This lab appointment can be cancelled. Please let the patient know.

## 2023-05-14 NOTE — Telephone Encounter (Signed)
Spoke to Patient's son Cletis Athens to let him know we cancelled the lab appointment due to the Patient getting the lab done in May 2024.

## 2023-05-16 ENCOUNTER — Other Ambulatory Visit: Payer: Medicare PPO

## 2023-05-16 ENCOUNTER — Other Ambulatory Visit: Payer: Self-pay | Admitting: Family Medicine

## 2023-05-16 DIAGNOSIS — J439 Emphysema, unspecified: Secondary | ICD-10-CM

## 2023-05-19 ENCOUNTER — Encounter: Payer: Self-pay | Admitting: Podiatry

## 2023-05-19 NOTE — Progress Notes (Signed)
Subjective:  Patient ID: Jessica Erickson, female    DOB: Feb 25, 1926,  MRN: [id number]  Jessica Erickson presents to clinic today for at risk foot care with history of diabetic neuropathy and painful elongated mycotic toenails 1-5 bilaterally which are tender when wearing enclosed shoe gear. Pain is relieved with periodic professional debridement.  Chief Complaint  Patient presents with   Nail Problem    DFC,Referring Provider Glori Luis, MD,lov:05/24,b/S:124,A1C:6.0      New problem(s): None.   PCP is Glori Luis, MD.  Allergies  Allergen Reactions   Penicillins Shortness Of Breath, Rash and Other (See Comments)    Has patient had a PCN reaction causing immediate rash, facial/tongue/throat swelling, SOB or lightheadedness with hypotension: Yes Has patient had a PCN reaction causing severe rash involving mucus membranes or skin necrosis: No Has patient had a PCN reaction that required hospitalization No Has patient had a PCN reaction occurring within the last 10 years: No If all of the above answers are "NO", then may proceed with Cephalosporin use.   Sulfa Antibiotics Shortness Of Breath, Rash and Other (See Comments)   Amitiza [Lubiprostone]     N/v/d   Aspirin Other (See Comments)    Reaction:  Unknown  Other reaction(s): Bleeding (intolerance) Per patient " causes nose to bleed" Can take 81 mg daily without any complications Other reaction(s): "bloody nose"    Penicillin G Other (See Comments)    Has patient had a PCN reaction causing immediate rash, facial/tongue/throat swelling, SOB or lightheadedness with hypotension: No Has patient had a PCN reaction causing severe rash involving mucus membranes or skin necrosis: Unknown Has patient had a PCN reaction that required hospitalization: Unknown Has patient had a PCN reaction occurring within the last 10 years: Unknown If all of the above answers are "NO", then may proceed with Cephalosporin use.    Review of  Systems: Negative except as noted in the HPI.  Objective: No changes noted in today's physical examination. Vitals:   05/14/23 1531  BP: 135/62   Navah Jessica Erickson is a pleasant 89 y.o. female in NAD. AAO x 3.  Vascular Examination: CFT <3 seconds b/l. DP/PT pulses faintly palpable b/l. Skin temperature gradient warm to warm b/l. No pain with calf compression. No ischemia or gangrene. No cyanosis or clubbing noted b/l. Pedal hair absent.   Neurological Examination:  Pt has subjective symptoms of neuropathy. Protective sensation intact 5/5 intact bilaterally with 10g monofilament b/l. Vibratory sensation intact b/l. Proprioception intact bilaterally.  Dermatological Examination: Pedal skin warm and supple b/l.   No open wounds. No interdigital macerations.  Toenails 1-5 b/l thick, discolored, elongated with subungual debris and pain on dorsal palpation.    Hyperkeratotic lesion(s) 1st metatarsal head right lower extremity.  No erythema, no edema, no drainage, no fluctuance.  Musculoskeletal Examination: Muscle strength 5/5 to b/l LE. Hallux valgus with bunion deformity noted right lower extremity.  Radiographs: None  Last A1c:      Latest Ref Rng & Units 04/02/2023    2:58 PM 07/25/2022    2:16 PM  Hemoglobin A1C  Hemoglobin-A1c 4.6 - 6.5 % 6.0  6.2    Assessment/Plan: 1. Pain due to onychomycosis of toenail   2. Callus   3. Type 2 diabetes mellitus with diabetic neuropathy, without long-term current use of insulin (HCC)     -Patient was evaluated and treated. All patient's and/or POA's questions/concerns answered on today's visit. -Diabetic foot examination performed today. -Continue diabetic foot care  principles: inspect feet daily, monitor glucose as recommended by PCP and/or Endocrinologist, and follow prescribed diet per PCP, Endocrinologist and/or dietician. -Patient to continue soft, supportive shoe gear daily. -Toenails 1-5 bilaterally were debrided in length and girth  with sterile nail nippers and dremel. Pinpoint bleeding of left great toe addressed with Lumicain Hemostatic Solution, cleansed with alcohol. Triple antibiotic ointment applied. Patient/careigver instructed to apply Neosporin Cream once daily for 7 days. -Callus(es) 1st metatarsal head right foot pared utilizing sterile scalpel blade without complication or incident. Total number debrided =1. -Patient/POA to call should there be question/concern in the interim.   Return in about 3 months (around 08/14/2023).  Jessica Erickson, DPM

## 2023-06-06 DIAGNOSIS — J449 Chronic obstructive pulmonary disease, unspecified: Secondary | ICD-10-CM | POA: Diagnosis not present

## 2023-06-12 DIAGNOSIS — G500 Trigeminal neuralgia: Secondary | ICD-10-CM | POA: Diagnosis not present

## 2023-06-25 ENCOUNTER — Encounter: Payer: Self-pay | Admitting: Family Medicine

## 2023-07-04 ENCOUNTER — Ambulatory Visit: Payer: Medicare PPO | Admitting: Family Medicine

## 2023-07-07 DIAGNOSIS — J449 Chronic obstructive pulmonary disease, unspecified: Secondary | ICD-10-CM | POA: Diagnosis not present

## 2023-07-11 ENCOUNTER — Encounter: Payer: Self-pay | Admitting: Family Medicine

## 2023-07-11 ENCOUNTER — Ambulatory Visit: Payer: Medicare PPO | Admitting: Family Medicine

## 2023-07-11 VITALS — BP 118/78 | HR 77 | Temp 98.5°F | Ht 67.0 in | Wt 188.0 lb

## 2023-07-11 DIAGNOSIS — G500 Trigeminal neuralgia: Secondary | ICD-10-CM

## 2023-07-11 DIAGNOSIS — J441 Chronic obstructive pulmonary disease with (acute) exacerbation: Secondary | ICD-10-CM

## 2023-07-11 NOTE — Assessment & Plan Note (Signed)
Chronic issue.  Patient will continue gabapentin 200 mg 3 times daily.  She will continue to follow with her specialist in [place].

## 2023-07-11 NOTE — Patient Instructions (Signed)
Please try to use your nebulizer treatment 3 times daily.

## 2023-07-11 NOTE — Progress Notes (Signed)
Jessica Alar, MD Phone: [phone]  Jessica Erickson is a 89 y.o. female who presents today for follow-up.  Trigeminal neuralgia: Patient is on gabapentin 200 mg 3 times daily.  She plans to have gamma knife treatment of this in the near future.  COPD: She is on Trelegy.  She is using nebulizer treatments 2 times a day.  Her caregiver notes that when she was doing them 3 times a day she seemed to be doing better.  She does have chronic cough that is productive of yellow mucus and chronic intermittent wheezing.  No shortness of breath.  They note her symptoms are generally stable.  She does have a history of bronchiectasis and has chest congestion most of the time.  Social History   Tobacco Use  Smoking Status Former   Current packs/day: 0.00   Average packs/day: 0.5 packs/day for 20.0 years (10.0 ttl pk-yrs)   Types: Cigarettes   Start date: 09/20/1975   Quit date: 09/20/1995   Years since quitting: 27.8  Smokeless Tobacco Never  Tobacco Comments   quit 1996 smoked 20 years max 8 cig qd     Current Outpatient Medications on File Prior to Visit  Medication Sig Dispense Refill   Accu-Chek Softclix Lancets lancets Use as instructed 100 each 12   acetaminophen (TYLENOL) 500 MG tablet Take 2 tablets (1,000 mg total) by mouth 2 (two) times daily as needed. 360 tablet 3   albuterol (PROVENTIL) (2.5 MG/3ML) 0.083% nebulizer solution Take 3 mLs (2.5 mg total) by nebulization in the morning, at noon, and at bedtime. 360 mL 11   apixaban (ELIQUIS) 2.5 MG TABS tablet Take 1 tablet (2.5 mg total) by mouth 2 (two) times daily. 180 tablet 3   atorvastatin (LIPITOR) 40 MG tablet Take 1 tablet (40 mg total) by mouth daily. 90 tablet 3   azithromycin (ZITHROMAX) 250 MG tablet Take 250 mg by mouth as directed.     bisacodyl (DULCOLAX) 5 MG EC tablet Take 5 mg by mouth daily as needed for moderate constipation.     Blood Glucose Monitoring Suppl (ACCU-CHEK AVIVA PLUS) w/Device KIT Use as directed 1  kit 0   calcium-vitamin D (OSCAL WITH D) 500-5 MG-MCG tablet Take 1 tablet by mouth 2 (two) times daily. Unable to find former dose 600ca/400 vitamin D inform patient dose change Oscal 180 tablet 3   cetirizine (ZYRTEC) 10 MG tablet Take 1 tablet (10 mg total) by mouth daily. 90 tablet 3   feeding supplement, GLUCERNA SHAKE, (GLUCERNA SHAKE) LIQD Take 237 mLs by mouth 2 (two) times daily between meals.  0   furosemide (LASIX) 40 MG tablet TAKE ONE TABLET (40MG ) DAILY. MAY TAKE ASECOND DOSE OF 40MG  IF NEEDED AT LUNCH FOR 5 DAYS 120 tablet 3   gabapentin (NEURONTIN) 100 MG capsule TAKE 2 CAPSULES BY MOUTH 3 TIMES DAILY. 504 capsule 3   glucose blood test strip Use to test blood sugar twice daily. 200 each 3   guaiFENesin (MUCINEX) 600 MG 12 hr tablet Take 2 tablets (1,200 mg total) by mouth 2 (two) times daily as needed for cough or to loosen phlegm. (Patient taking differently: Take 600 mg by mouth 2 (two) times daily as needed for cough or to loosen phlegm.)     ipratropium-albuterol (DUONEB) 0.5-2.5 (3) MG/3ML SOLN TAKE 3 ML BY NEBULIZATION EVERY 4 HOURS AS NEEDED 360 mL 11   levothyroxine (SYNTHROID) 150 MCG tablet Take 1 tablet (150 mcg total) by mouth daily before breakfast. 90 tablet  1   Lidocaine-Menthol (ICY HOT MAX LIDOCAINE) 4-1 % CREA Apply 1 application. topically 3 (three) times daily with meals as needed. Dispense cream or spray pt preference 120 g 11   Melatonin 10 MG TABS Take 10 mg by mouth at bedtime. 90 tablet 3   mirtazapine (REMERON) 15 MG tablet Take 1 tablet (15 mg total) by mouth at bedtime. 90 tablet 3   Multiple Vitamin (MULTIVITAMIN WITH MINERALS) TABS tablet Take 1 tablet by mouth daily.      pantoprazole (PROTONIX) 40 MG tablet Take 1 tablet (40 mg total) by mouth daily. 30 minutes before lunch or dinner 90 tablet 3   polyethylene glycol powder (GLYCOLAX/MIRALAX) 17 GM/SCOOP powder Take 17 g by mouth daily. 255 g 11   polyvinyl alcohol (LIQUIFILM TEARS) 1.4 % ophthalmic  solution Place 1 drop into both eyes at bedtime. 15 mL 0   TRELEGY ELLIPTA 100-62.5-25 MCG/ACT AEPB INHALE ONE PUFF DAILY. RINSE MOUTH AFTERUSE. STOP SYMBICORT & SPIRIVA. 60 each 11   No current facility-administered medications on file prior to visit.     ROS see history of present illness  Objective  Physical Exam Vitals:   07/11/23 1406  BP: 118/78  Pulse: 77  Temp: 98.5 F (36.9 C)  SpO2: 97%    BP Readings from Last 3 Encounters:  07/11/23 118/78  05/14/23 135/62  04/02/23 134/82   Wt Readings from Last 3 Encounters:  07/11/23 188 lb (85.3 kg)  04/02/23 188 lb (85.3 kg)  03/28/23 189 lb 6.4 oz (85.9 kg)    Physical Exam Constitutional:      General: She is not in acute distress.    Appearance: She is not diaphoretic.  Cardiovascular:     Rate and Rhythm: Normal rate and regular rhythm.     Heart sounds: Normal heart sounds.  Pulmonary:     Effort: Pulmonary effort is normal.     Breath sounds: Wheezing (Occasional scattered wheeze) present.  Skin:    General: Skin is warm and dry.  Neurological:     Mental Status: She is alert.      Assessment/Plan: Please see individual problem list.  Chronic obstructive pulmonary disease with acute exacerbation (HCC) Assessment & Plan: Chronic issue.  Generally stable.  She will continue Trelegy 1 puff daily.  I encouraged her to use her nebulizer treatment 3 times daily to see if that would be more beneficial for her.  She will continue to see pulmonology.  I did discuss the bronchiectasis could be contributing to the mucus production that she has.  CMA demonstrated how to use the breathing treatment.   Trigeminal neuralgia Assessment & Plan: Chronic issue.  Patient will continue gabapentin 200 mg 3 times daily.  She will continue to follow with her specialist in [place].     Return in about 3 months (around 10/11/2023).   Jessica Alar, MD [name]

## 2023-07-11 NOTE — Assessment & Plan Note (Addendum)
Chronic issue.  Generally stable.  She will continue Trelegy 1 puff daily.  I encouraged her to use her nebulizer treatment 3 times daily to see if that would be more beneficial for her.  She will continue to see pulmonology.  I did discuss the bronchiectasis could be contributing to the mucus production that she has.  CMA demonstrated how to use the breathing treatment.

## 2023-08-07 DIAGNOSIS — J449 Chronic obstructive pulmonary disease, unspecified: Secondary | ICD-10-CM | POA: Diagnosis not present

## 2023-08-14 ENCOUNTER — Telehealth: Payer: Self-pay

## 2023-08-14 ENCOUNTER — Encounter: Payer: Self-pay | Admitting: Nurse Practitioner

## 2023-08-14 ENCOUNTER — Ambulatory Visit (INDEPENDENT_AMBULATORY_CARE_PROVIDER_SITE_OTHER): Payer: Medicare PPO

## 2023-08-14 ENCOUNTER — Ambulatory Visit: Payer: Medicare PPO | Admitting: Nurse Practitioner

## 2023-08-14 VITALS — BP 110/74 | HR 73 | Temp 98.1°F | Resp 16 | Ht 67.0 in | Wt 186.2 lb

## 2023-08-14 DIAGNOSIS — R0602 Shortness of breath: Secondary | ICD-10-CM

## 2023-08-14 DIAGNOSIS — R9389 Abnormal findings on diagnostic imaging of other specified body structures: Secondary | ICD-10-CM | POA: Diagnosis not present

## 2023-08-14 DIAGNOSIS — R350 Frequency of micturition: Secondary | ICD-10-CM | POA: Diagnosis not present

## 2023-08-14 DIAGNOSIS — M40204 Unspecified kyphosis, thoracic region: Secondary | ICD-10-CM | POA: Diagnosis not present

## 2023-08-14 LAB — POC URINALSYSI DIPSTICK (AUTOMATED)
Bilirubin, UA: NEGATIVE
Glucose, UA: NEGATIVE
Ketones, UA: NEGATIVE
Nitrite, UA: POSITIVE
Protein, UA: POSITIVE — AB
Spec Grav, UA: 1.02 (ref 1.010–1.025)
Urobilinogen, UA: 1 U/dL
pH, UA: 6 (ref 5.0–8.0)

## 2023-08-14 MED ORDER — DOXYCYCLINE HYCLATE 100 MG PO TABS: 100.0000 mg | ORAL_TABLET | Freq: Two times a day (BID) | ORAL | 0 refills | Status: AC

## 2023-08-14 MED ORDER — PREDNISONE 20 MG PO TABS: 40.0000 mg | ORAL_TABLET | Freq: Every day | ORAL | 0 refills | Status: DC

## 2023-08-14 NOTE — Telephone Encounter (Signed)
Error

## 2023-08-14 NOTE — Progress Notes (Signed)
Established Patient Office Visit  Subjective:  Patient ID: Jessica Erickson, female    DOB: 30-Aug-1926  Age: 89 y.o. MRN: [id number]  CC:  Chief Complaint  Patient presents with   Urinary Tract Infection    Monday Odor and confused    Shortness of Breath    HPI  Jessica Erickson presents for SOB and Symptoms of UTI with the caregiver.  She has cough, RN, fatigue from last 2 days.  Has been using nebulizer and Mucinex.  She has dark urine and odor to it from last few days. She denise dysuria, abdominal pain or fever.  HPI   Past Medical History:  Diagnosis Date   Acoustic neuroma (HCC)    Allergy    Asthma    Bilateral swelling of feet    and legs   Bladder infection    CAD (coronary artery disease)    Cataract    Change in voice    Compression fracture of body of thoracic vertebra (HCC)    T12 09/18/15 MRI s/p fall    Constipation    COPD (chronic obstructive pulmonary disease) (HCC)    previous CXR with chronic interstitial lung dz    CVA (cerebral vascular accident) (HCC)    Depression    Diabetes (HCC)    with neuropathy   Diabetes mellitus, type 2 (HCC)    Diarrhea    Double vision    DVT (deep venous thrombosis) (HCC)    right leg 10/2015 was on coumadin off as of 2017/2018 ; s/p IVC filter   Enuresis    Eye pain, right    Fall    Fatty liver    09/15/15 also mildly dilated pancreatitic duct rec MRCP small sub cm cyst hemangioma speeln mild right hydronephrorossi and prox. hydroureter, kidney stones, mild scarring kidneys   Female stress incontinence    Flank pain    GERD (gastroesophageal reflux disease)    with small hiatal hernia    Hard of hearing    Heart disease    History of kidney problems    Hyperlipidemia    mixed   Hypertension    Hypothyroidism, postsurgical    Impaired mobility and ADLs    uses rolling walker has caretaker 24/7 at home   Leg edema    Mixed incontinence urge and stress (female)(female)    Neuropathy    Osteoarthritis     DDD spine    Osteoporosis with fracture    T12 compression fracture   Photophobia    Pulmonary embolism (HCC)    10/2015 off coumadin as of 04/2016   Pulmonary HTN (HCC)    mild pulm HTN, echo 10/09/15 EF 55-60%grade 1 dd, RV systolic pressure increased    Recurrent UTI    Sinus pressure    Skin cancer    BCC jawline and scalp    Thyroid disease    follows [facility]   TIA (transient ischemic attack)    MRI 2009/2010 neg stroke    Trigeminal neuralgia    Dr. Cammie Mcgee s/p gamma knife x 2, on Tegretol since 2011/2012 no increase in dose >200 mg bid rec per family per neurology    Urinary frequency    Urinary, incontinence, stress female    Dr Apolinar Junes urology     Past Surgical History:  Procedure Laterality Date   APPENDECTOMY     as a child, open   BRAIN SURGERY     schwnnoma removal 1996  brain tumor surgery     BREAST SURGERY     breast bx   CATARACT EXTRACTION     CHOLECYSTECTOMY     EYE SURGERY     cataract   IVC FILTER PLACEMENT ([facility] HX)     Dr. Wyn Quaker 10/2015    LAPAROSCOPIC TUBAL LIGATION     MOHS SURGERY     scalp 04/2014    PERIPHERAL VASCULAR CATHETERIZATION N/A 10/11/2015   Procedure: IVC Filter Insertion;  Surgeon: Annice Needy, MD;  Location: [facility];  Service: Cardiovascular;  Laterality: N/A;   PERIPHERAL VASCULAR THROMBECTOMY Bilateral 03/29/2018   Procedure: PERIPHERAL VASCULAR THROMBECTOMY;  Surgeon: Annice Needy, MD;  Location: [facility];  Service: Cardiovascular;  Laterality: Bilateral;   PUBOVAGINAL SLING     THROAT SURGERY     THYROID SURGERY     tumor around vocal cords    TOOTH EXTRACTION     winter 2018    TOTAL THYROIDECTOMY  1976    Family History  Problem Relation Age of Onset   Heart disease Mother    Diabetes Father    Cancer Daughter        breast ca x 2 s/p mastectomy     Social History   Socioeconomic History   Marital status: Widowed    Spouse name: Not on file   Number of children: 5   Years of  education: Not on file   Highest education level: Not on file  Occupational History   Occupation: retired  Tobacco Use   Smoking status: Former    Current packs/day: 0.00    Average packs/day: 0.5 packs/day for 20.0 years (10.0 ttl pk-yrs)    Types: Cigarettes    Start date: 09/20/1975    Quit date: 09/20/1995    Years since quitting: 27.9   Smokeless tobacco: Never   Tobacco comments:    quit 1996 smoked 20 years max 8 cig qd   Vaping Use   Vaping status: Never Used  Substance and Sexual Activity   Alcohol use: No   Drug use: No   Sexual activity: Not on file  Other Topics Concern   Not on file  Social History Narrative   Lives at home with Jonny Ruiz son and caretakers come to house    Has children    Likes to ride stationary bike and sew    [name] lives with as of 2022 and he is POA youngest son            Social Determinants of Corporate investment banker Strain: Low Risk  (01/18/2023)   Overall Financial Resource Strain (CARDIA)    Difficulty of Paying Living Expenses: Not hard at all  Food Insecurity: No Food Insecurity (01/18/2023)   Hunger Vital Sign    Worried About Running Out of Food in the Last Year: Never true    Ran Out of Food in the Last Year: Never true  Transportation Needs: No Transportation Needs (01/18/2023)   PRAPARE - Administrator, Civil Service (Medical): No    Lack of Transportation (Non-Medical): No  Physical Activity: Insufficiently Active (01/18/2023)   Exercise Vital Sign    Days of Exercise per Week: 7 days    Minutes of Exercise per Session: 20 min  Stress: No Stress Concern Present (01/18/2023)   [employer] of Occupational Health - Occupational Stress Questionnaire    Feeling of Stress : Not at all  Social Connections: Unknown (01/18/2023)  Social Advertising account executive [NHANES]    Frequency of Communication with Friends and Family: More than three times a week    Frequency of Social Gatherings with Friends and  Family: More than three times a week    Attends Religious Services: Not on file    Active Member of Clubs or Organizations: Not on file    Attends Banker Meetings: Not on file    Marital Status: Not on file  Intimate Partner Violence: Not At Risk (01/18/2023)   Humiliation, Afraid, Rape, and Kick questionnaire    Fear of Current or Ex-Partner: No    Emotionally Abused: No    Physically Abused: No    Sexually Abused: No     Outpatient Medications Prior to Visit  Medication Sig Dispense Refill   Accu-Chek Softclix Lancets lancets Use as instructed 100 each 12   acetaminophen (TYLENOL) 500 MG tablet Take 2 tablets (1,000 mg total) by mouth 2 (two) times daily as needed. 360 tablet 3   albuterol (PROVENTIL) (2.5 MG/3ML) 0.083% nebulizer solution Take 3 mLs (2.5 mg total) by nebulization in the morning, at noon, and at bedtime. 360 mL 11   apixaban (ELIQUIS) 2.5 MG TABS tablet Take 1 tablet (2.5 mg total) by mouth 2 (two) times daily. 180 tablet 3   atorvastatin (LIPITOR) 40 MG tablet Take 1 tablet (40 mg total) by mouth daily. 90 tablet 3   azithromycin (ZITHROMAX) 250 MG tablet Take 250 mg by mouth as directed.     bisacodyl (DULCOLAX) 5 MG EC tablet Take 5 mg by mouth daily as needed for moderate constipation.     Blood Glucose Monitoring Suppl (ACCU-CHEK AVIVA PLUS) w/Device KIT Use as directed 1 kit 0   calcium-vitamin D (OSCAL WITH D) 500-5 MG-MCG tablet Take 1 tablet by mouth 2 (two) times daily. Unable to find former dose 600ca/400 vitamin D inform patient dose change Oscal 180 tablet 3   cetirizine (ZYRTEC) 10 MG tablet Take 1 tablet (10 mg total) by mouth daily. 90 tablet 3   feeding supplement, GLUCERNA SHAKE, (GLUCERNA SHAKE) LIQD Take 237 mLs by mouth 2 (two) times daily between meals.  0   furosemide (LASIX) 40 MG tablet TAKE ONE TABLET (40MG ) DAILY. MAY TAKE ASECOND DOSE OF 40MG  IF NEEDED AT LUNCH FOR 5 DAYS 120 tablet 3   gabapentin (NEURONTIN) 100 MG capsule TAKE  2 CAPSULES BY MOUTH 3 TIMES DAILY. 504 capsule 3   glucose blood test strip Use to test blood sugar twice daily. 200 each 3   guaiFENesin (MUCINEX) 600 MG 12 hr tablet Take 2 tablets (1,200 mg total) by mouth 2 (two) times daily as needed for cough or to loosen phlegm. (Patient taking differently: Take 600 mg by mouth 2 (two) times daily as needed for cough or to loosen phlegm.)     ipratropium-albuterol (DUONEB) 0.5-2.5 (3) MG/3ML SOLN TAKE 3 ML BY NEBULIZATION EVERY 4 HOURS AS NEEDED 360 mL 11   levothyroxine (SYNTHROID) 150 MCG tablet Take 1 tablet (150 mcg total) by mouth daily before breakfast. 90 tablet 1   Lidocaine-Menthol (ICY HOT MAX LIDOCAINE) 4-1 % CREA Apply 1 application. topically 3 (three) times daily with meals as needed. Dispense cream or spray pt preference 120 g 11   Melatonin 10 MG TABS Take 10 mg by mouth at bedtime. 90 tablet 3   mirtazapine (REMERON) 15 MG tablet Take 1 tablet (15 mg total) by mouth at bedtime. 90 tablet 3   Multiple Vitamin (  MULTIVITAMIN WITH MINERALS) TABS tablet Take 1 tablet by mouth daily.      pantoprazole (PROTONIX) 40 MG tablet Take 1 tablet (40 mg total) by mouth daily. 30 minutes before lunch or dinner 90 tablet 3   polyethylene glycol powder (GLYCOLAX/MIRALAX) 17 GM/SCOOP powder Take 17 g by mouth daily. 255 g 11   polyvinyl alcohol (LIQUIFILM TEARS) 1.4 % ophthalmic solution Place 1 drop into both eyes at bedtime. 15 mL 0   TRELEGY ELLIPTA 100-62.5-25 MCG/ACT AEPB INHALE ONE PUFF DAILY. RINSE MOUTH AFTERUSE. STOP SYMBICORT & SPIRIVA. 60 each 11   No facility-administered medications prior to visit.    Allergies  Allergen Reactions   Penicillins Shortness Of Breath, Rash and Other (See Comments)    Has patient had a PCN reaction causing immediate rash, facial/tongue/throat swelling, SOB or lightheadedness with hypotension: Yes Has patient had a PCN reaction causing severe rash involving mucus membranes or skin necrosis: No Has patient had a  PCN reaction that required hospitalization No Has patient had a PCN reaction occurring within the last 10 years: No If all of the above answers are "NO", then may proceed with Cephalosporin use.   Sulfa Antibiotics Shortness Of Breath, Rash and Other (See Comments)   Amitiza [Lubiprostone]     N/v/d   Aspirin Other (See Comments)    Reaction:  Unknown  Other reaction(s): Bleeding (intolerance) Per patient " causes nose to bleed" Can take 81 mg daily without any complications Other reaction(s): "bloody nose"    Penicillin G Other (See Comments)    Has patient had a PCN reaction causing immediate rash, facial/tongue/throat swelling, SOB or lightheadedness with hypotension: No Has patient had a PCN reaction causing severe rash involving mucus membranes or skin necrosis: Unknown Has patient had a PCN reaction that required hospitalization: Unknown Has patient had a PCN reaction occurring within the last 10 years: Unknown If all of the above answers are "NO", then may proceed with Cephalosporin use.    ROS Review of Systems Negative unless indicated in HPI.    Objective:    Physical Exam Constitutional:      Appearance: She is well-developed and normal weight.  HENT:     Nose: Nose normal.  Cardiovascular:     Rate and Rhythm: Normal rate and regular rhythm.     Pulses: Normal pulses.     Heart sounds: Normal heart sounds.  Pulmonary:     Effort: Pulmonary effort is normal.     Breath sounds: Examination of the right-upper field reveals wheezing. Examination of the left-upper field reveals wheezing. Examination of the right-middle field reveals wheezing. Examination of the left-middle field reveals wheezing. Examination of the right-lower field reveals wheezing. Examination of the left-lower field reveals wheezing. Wheezing present.  Abdominal:     General: Bowel sounds are normal.     Palpations: There is no mass.     Tenderness: There is no abdominal tenderness. There is no  right CVA tenderness or left CVA tenderness.  Skin:    Findings: Lesion present.  Neurological:     General: No focal deficit present.     Mental Status: She is alert and oriented to person, place, and time. Mental status is at baseline.  Psychiatric:        Mood and Affect: Mood normal.        Behavior: Behavior normal.        Thought Content: Thought content normal.        Judgment: Judgment normal.  BP 110/74   Pulse 73   Temp 98.1 F (36.7 C)   Resp 16   Ht 5\' 7"  (1.702 m)   Wt 186 lb 4 oz (84.5 kg)   LMP  (LMP Unknown)   SpO2 96%   BMI 29.17 kg/m  Wt Readings from Last 3 Encounters:  08/14/23 186 lb 4 oz (84.5 kg)  07/11/23 188 lb (85.3 kg)  04/02/23 188 lb (85.3 kg)     Health Maintenance  Topic Date Due   Zoster Vaccines- Shingrix (1 of 2) Never done   Pneumonia Vaccine 37+ Years old (2 of 2 - PCV) 08/30/2019   OPHTHALMOLOGY EXAM  12/30/2020   COVID-19 Vaccine (5 - 2023-24 season) 08/05/2023   DEXA SCAN  01/19/2024 (Originally 04/22/1993)   INFLUENZA VACCINE  03/03/2024 (Originally 07/05/2023)   HEMOGLOBIN A1C  10/02/2023   Medicare Annual Wellness (AWV)  01/19/2024   FOOT EXAM  05/13/2024   HPV VACCINES  Aged Out   DTaP/Tdap/Td  Discontinued    There are no preventive care reminders to display for this patient.  Lab Results  Component Value Date   TSH 0.73 04/25/2023   Lab Results  Component Value Date   WBC 4.8 11/07/2022   HGB 11.4 (L) 11/07/2022   HCT 35.9 (L) 11/07/2022   MCV 97.3 11/07/2022   PLT 151 11/07/2022   Lab Results  Component Value Date   NA 145 11/07/2022   K 4.0 11/07/2022   CO2 33 (H) 11/07/2022   GLUCOSE 134 (H) 11/07/2022   BUN 21 11/07/2022   CREATININE 1.35 (H) 11/07/2022   BILITOT 0.7 11/06/2022   ALKPHOS 53 11/06/2022   AST 27 11/06/2022   ALT 16 11/06/2022   PROT 6.5 11/06/2022   ALBUMIN 3.4 (L) 11/06/2022   CALCIUM 8.1 (L) 11/07/2022   ANIONGAP 8 11/07/2022   EGFR 32 (L) 06/09/2021   GFR 34.00 (L)  07/25/2022   Lab Results  Component Value Date   CHOL 128 07/25/2022   Lab Results  Component Value Date   HDL 52.90 07/25/2022   Lab Results  Component Value Date   LDLCALC 57 07/25/2022   Lab Results  Component Value Date   TRIG 88.0 07/25/2022   Lab Results  Component Value Date   CHOLHDL 2 07/25/2022   Lab Results  Component Value Date   HGBA1C 6.0 04/02/2023      Assessment & Plan:  Urination frequency Assessment & Plan: POCT urinalysis positive for nitrite, blood and leukocytes. She is allergic to penicillin and sulfa. Will start on doxycycline, advised to take probiotics and yogurt. Culture pending.  Orders: -     POCT Urinalysis Dipstick (Automated) -     Urine Culture  SOB (shortness of breath) Assessment & Plan: Wheezing bilaterally. Given patient symptoms will treat with doxycycline and prednisone. Chest x-ray pending. Advised to call the office or go to the ED if have difficulty breathing, shortness of breath, chest pain.   Orders: -     POC COVID-19 BinaxNow -     DG Chest 2 View; Future  Other orders -     predniSONE; Take 2 tablets (40 mg total) by mouth daily with breakfast.  Dispense: 10 tablet; Refill: 0 -     Doxycycline Hyclate; Take 1 tablet (100 mg total) by mouth 2 (two) times daily for 7 days.  Dispense: 14 tablet; Refill: 0    Follow-up: Return if symptoms worsen or fail to improve.   Kara Dies, NP

## 2023-08-14 NOTE — Telephone Encounter (Signed)
noted 

## 2023-08-14 NOTE — Patient Instructions (Signed)
Rx sent to pharmacy. Will call with the result of culture and Xray.

## 2023-08-17 ENCOUNTER — Telehealth: Payer: Self-pay | Admitting: Family Medicine

## 2023-08-17 LAB — URINE CULTURE
MICRO NUMBER:: 15445963
SPECIMEN QUALITY:: ADEQUATE

## 2023-08-17 NOTE — Telephone Encounter (Signed)
Called Jessica Erickson to let her know there is a shortage on Radiologists so the results are delayed but when they come in we will call them with the results.

## 2023-08-17 NOTE — Telephone Encounter (Signed)
Patient's daughter Harriett Sine called and wants to know about her mom's chest X ray. She said she didn't see anything on her My Chart. Could someone call her and go over her results. Her number is [phone].

## 2023-08-19 ENCOUNTER — Encounter: Payer: Self-pay | Admitting: Nurse Practitioner

## 2023-08-19 DIAGNOSIS — R0602 Shortness of breath: Secondary | ICD-10-CM | POA: Insufficient documentation

## 2023-08-19 DIAGNOSIS — R350 Frequency of micturition: Secondary | ICD-10-CM | POA: Insufficient documentation

## 2023-08-19 HISTORY — DX: Frequency of micturition: R35.0

## 2023-08-19 NOTE — Assessment & Plan Note (Signed)
Wheezing bilaterally. Given patient symptoms will treat with doxycycline and prednisone. Chest x-ray pending. Advised to call the office or go to the ED if have difficulty breathing, shortness of breath, chest pain.

## 2023-08-19 NOTE — Assessment & Plan Note (Signed)
POCT urinalysis positive for nitrite, blood and leukocytes. She is allergic to penicillin and sulfa. Will start on doxycycline, advised to take probiotics and yogurt. Culture pending.

## 2023-08-20 LAB — POC COVID19 BINAXNOW: SARS Coronavirus 2 Ag: NEGATIVE

## 2023-08-21 ENCOUNTER — Other Ambulatory Visit: Payer: Self-pay | Admitting: Family Medicine

## 2023-08-21 DIAGNOSIS — R600 Localized edema: Secondary | ICD-10-CM

## 2023-08-22 ENCOUNTER — Ambulatory Visit
Admission: RE | Admit: 2023-08-22 | Discharge: 2023-08-22 | Disposition: A | Discharge status: Home / Self Care | Payer: Medicare PPO | Source: Ambulatory Visit | Attending: Emergency Medicine | Admitting: Emergency Medicine

## 2023-08-22 ENCOUNTER — Telehealth: Payer: Self-pay | Admitting: Family Medicine

## 2023-08-22 VITALS — BP 137/71 | HR 71 | Temp 98.3°F | Resp 16

## 2023-08-22 DIAGNOSIS — N3000 Acute cystitis without hematuria: Secondary | ICD-10-CM | POA: Diagnosis not present

## 2023-08-22 LAB — POCT URINALYSIS DIP (MANUAL ENTRY)
Bilirubin, UA: NEGATIVE
Glucose, UA: NEGATIVE mg/dL
Ketones, POC UA: NEGATIVE mg/dL
Nitrite, UA: POSITIVE — AB
Protein Ur, POC: 100 mg/dL — AB
Spec Grav, UA: 1.02 (ref 1.010–1.025)
Urobilinogen, UA: 0.2 U/dL
pH, UA: 7 (ref 5.0–8.0)

## 2023-08-22 MED ORDER — NITROFURANTOIN MONOHYD MACRO 100 MG PO CAPS: 100.0000 mg | ORAL_CAPSULE | Freq: Two times a day (BID) | ORAL | 0 refills | Status: AC

## 2023-08-22 NOTE — Discharge Instructions (Addendum)
Your urinalysis shows Jessica Erickson blood cells and nitrates which are indicative of infection, your urine will be sent to the lab to determine exactly which bacteria is present, if any changes need to be made to your medications you will be notified  Begin use of Macrobid every morning and every evening for 7 days, antibiotic was chosen based on last urine culture obtained by your primary doctor which showed E. coli  Increase your fluid intake through use of water  As always practice good hygiene, wiping front to back and avoidance of scented vaginal products to prevent further irritation  If symptoms continue to persist after use of medication or recur please follow-up with urgent care or your primary doctor as needed

## 2023-08-22 NOTE — ED Provider Notes (Signed)
Jessica Erickson    CSN: [id number] Arrival date & time: 08/22/23  1459      History   Chief Complaint Chief Complaint  Patient presents with   Urinary Frequency    Mom is coming of uti and her  urine is brown again. Woukd like to get her checked and urinalysis please.Marland KitchenMarland KitchenJom - Entered by patient    HPI Jessica Erickson is a 89 y.o. female.   Patient presents for evaluation of dark urine and urinary frequency beginning today.  Has noticed behavior changes over the last 2 days with intermittent confusion and increased fatigue, worse in the evenings.  Was evaluated on August 14, 2023 for same symptoms by PCP, urinalysis positive, at that time was wheezing on exam and also thought to have possible pneumonia therefore started on doxycycline and prednisone while urine with culture.  Did see some improvement in symptoms but they continue to persist.  Denies presence of fever, abdominal pain, flank pain, hematuria, dysuria.  Past Medical History:  Diagnosis Date   Acoustic neuroma (HCC)    Allergy    Asthma    Bilateral swelling of feet    and legs   Bladder infection    CAD (coronary artery disease)    Cataract    Change in voice    Compression fracture of body of thoracic vertebra (HCC)    T12 09/18/15 MRI s/p fall    Constipation    COPD (chronic obstructive pulmonary disease) (HCC)    previous CXR with chronic interstitial lung dz    CVA (cerebral vascular accident) (HCC)    Depression    Diabetes (HCC)    with neuropathy   Diabetes mellitus, type 2 (HCC)    Diarrhea    Double vision    DVT (deep venous thrombosis) (HCC)    right leg 10/2015 was on coumadin off as of 2017/2018 ; s/p IVC filter   Enuresis    Eye pain, right    Fall    Fatty liver    09/15/15 also mildly dilated pancreatitic duct rec MRCP small sub cm cyst hemangioma speeln mild right hydronephrorossi and prox. hydroureter, kidney stones, mild scarring kidneys   Female stress incontinence    Flank  pain    GERD (gastroesophageal reflux disease)    with small hiatal hernia    Hard of hearing    Heart disease    History of kidney problems    Hyperlipidemia    mixed   Hypertension    Hypothyroidism, postsurgical    Impaired mobility and ADLs    uses rolling walker has caretaker 24/7 at home   Leg edema    Mixed incontinence urge and stress (female)(female)    Neuropathy    Osteoarthritis    DDD spine    Osteoporosis with fracture    T12 compression fracture   Photophobia    Pulmonary embolism (HCC)    10/2015 off coumadin as of 04/2016   Pulmonary HTN (HCC)    mild pulm HTN, echo 10/09/15 EF 55-60%grade 1 dd, RV systolic pressure increased    Recurrent UTI    Sinus pressure    Skin cancer    BCC jawline and scalp    Thyroid disease    follows [facility]   TIA (transient ischemic attack)    MRI 2009/2010 neg stroke    Trigeminal neuralgia    Dr. Cammie Mcgee s/p gamma knife x 2, on Tegretol since 2011/2012 no increase in dose >200 mg  bid rec per family per neurology    Urinary frequency    Urinary, incontinence, stress female    Dr Apolinar Junes urology     Patient Active Problem List   Diagnosis Date Noted   Urination frequency 08/19/2023   SOB (shortness of breath) 08/19/2023   Vaso vagal episode 04/02/2023   Acute respiratory failure with hypoxia and hypercapnia (HCC) 11/06/2022   Acute on chronic diastolic CHF (congestive heart failure) (HCC) 11/06/2022   FTT (failure to thrive) in adult 10/25/2022   Dyslipidemia 10/24/2022   Hypothyroidism 10/24/2022   GERD without esophagitis 10/24/2022   Hearing loss 09/01/2022   DNR (do not resuscitate) 08/01/2022   Abnormal gait 10/19/2021   At high risk for falls 10/14/2021   Spinal stenosis of lumbar region 09/30/2021   Pain in both lower extremities 09/30/2021   Osteoarthritis 09/30/2021   Lumbar and sacral arthritis 09/30/2021   Pure hypercholesterolemia, unspecified 09/15/2021   Hyperlipidemia, unspecified 09/15/2021    Coronary atherosclerosis 09/15/2021   Hearing decreased, bilateral 08/22/2021   Acute on chronic respiratory failure (HCC) 08/14/2021   Shortness of breath 08/13/2021   CKD (chronic kidney disease), stage III (HCC) 08/13/2021   History of CVA (cerebrovascular accident) 08/13/2021   Prediabetes 06/29/2021   Pulmonary artery hypertension (HCC) 05/03/2021   Grade I diastolic dysfunction 08/16/2020   Obesity (BMI 30-39.9) 08/16/2020   Chronic kidney disease, stage 3b (HCC) 04/06/2020   Thrombocytopenia (HCC) 04/06/2020   Seborrheic keratoses 04/06/2020   Hip pain 09/30/2019   Arthritis 09/30/2019   Fall 03/11/2019   Epistaxis 12/11/2018   History of DVT (deep vein thrombosis) 11/12/2018   Bilateral lower extremity edema 06/04/2018   Hearing loss of both ears 06/04/2018   Actinic keratosis 06/04/2018   Bronchiectasis (HCC) 06/04/2018   Depression 05/17/2018   Dehydration 05/15/2018   Hypertension 05/03/2018   Lymphedema 05/02/2018   Iron deficiency anemia 05/02/2018   Presence of IVC filter 05/02/2018   Anemia 04/11/2018   Fatigue 04/11/2018   Moderate protein-calorie malnutrition (HCC) 03/22/2018   Orthostasis 03/05/2018   Dizziness 03/04/2018   COPD (chronic obstructive pulmonary disease) (HCC) 12/13/2017   Anxiety and depression 12/13/2017   Trigeminal neuralgia 12/13/2017   GERD (gastroesophageal reflux disease) 12/13/2017   Constipation 12/13/2017   Insomnia 12/13/2017   Low back pain 10/08/2015   Collapsed vertebra, not elsewhere classified, thoracic region, initial encounter for fracture (HCC) 09/20/2015   Basal cell carcinoma of scalp 04/06/2014   Fothergill's neuralgia 08/05/2013   Bladder infection, chronic 02/11/2013   Female genuine stress incontinence 02/11/2013   Incomplete bladder emptying 02/11/2013   Intrinsic sphincter deficiency 02/11/2013   Mixed incontinence 02/11/2013   Excessive urination at night 02/11/2013   Bladder retention 02/11/2013   FOM  (frequency of micturition) 02/11/2013   Basal cell carcinoma of face 09/13/2011    Past Surgical History:  Procedure Laterality Date   APPENDECTOMY     as a child, open   BRAIN SURGERY     schwnnoma removal 1996    brain tumor surgery     BREAST SURGERY     breast bx   CATARACT EXTRACTION     CHOLECYSTECTOMY     EYE SURGERY     cataract   IVC FILTER PLACEMENT ([facility] HX)     Dr. Wyn Quaker 10/2015    LAPAROSCOPIC TUBAL LIGATION     MOHS SURGERY     scalp 04/2014    PERIPHERAL VASCULAR CATHETERIZATION N/A 10/11/2015   Procedure: IVC Filter Insertion;  Surgeon:  Annice Needy, MD;  Location: [facility] INVASIVE CV LAB;  Service: Cardiovascular;  Laterality: N/A;   PERIPHERAL VASCULAR THROMBECTOMY Bilateral 03/29/2018   Procedure: PERIPHERAL VASCULAR THROMBECTOMY;  Surgeon: Annice Needy, MD;  Location: [facility] INVASIVE CV LAB;  Service: Cardiovascular;  Laterality: Bilateral;   PUBOVAGINAL SLING     THROAT SURGERY     THYROID SURGERY     tumor around vocal cords    TOOTH EXTRACTION     winter 2018    TOTAL THYROIDECTOMY  1976    OB History   No obstetric history on file.      Home Medications    Prior to Admission medications   Medication Sig Start Date End Date Taking? Authorizing Provider  nitrofurantoin, macrocrystal-monohydrate, (MACROBID) 100 MG capsule Take 1 capsule (100 mg total) by mouth 2 (two) times daily for 7 days. 08/22/23 08/29/23 Yes Valinda Hoar, NP  Accu-Chek Softclix Lancets lancets Use as instructed 10/11/22   Worthy Rancher B, FNP  acetaminophen (TYLENOL) 500 MG tablet Take 2 tablets (1,000 mg total) by mouth 2 (two) times daily as needed. 09/29/21   McLean-Scocuzza, Pasty Spillers, MD  albuterol (PROVENTIL) (2.5 MG/3ML) 0.083% nebulizer solution Take 3 mLs (2.5 mg total) by nebulization in the morning, at noon, and at bedtime. 01/25/22   McLean-Scocuzza, Pasty Spillers, MD  apixaban (ELIQUIS) 2.5 MG TABS tablet Take 1 tablet (2.5 mg total) by mouth 2 (two) times daily. 02/28/23    Glori Luis, MD  atorvastatin (LIPITOR) 40 MG tablet Take 1 tablet (40 mg total) by mouth daily. 04/18/23   Glori Luis, MD  azithromycin (ZITHROMAX) 250 MG tablet Take 250 mg by mouth as directed. 12/26/22   [provider]  bisacodyl (DULCOLAX) 5 MG EC tablet Take 5 mg by mouth daily as needed for moderate constipation.    [provider]  Blood Glucose Monitoring Suppl (ACCU-CHEK AVIVA PLUS) w/Device KIT Use as directed 09/22/21   McLean-Scocuzza, Pasty Spillers, MD  calcium-vitamin D (OSCAL WITH D) 500-5 MG-MCG tablet Take 1 tablet by mouth 2 (two) times daily. Unable to find former dose 600ca/400 vitamin D inform patient dose change Oscal 08/23/22   McLean-Scocuzza, Pasty Spillers, MD  cetirizine (ZYRTEC) 10 MG tablet Take 1 tablet (10 mg total) by mouth daily. 02/28/23   Glori Luis, MD  feeding supplement, GLUCERNA SHAKE, (GLUCERNA SHAKE) LIQD Take 237 mLs by mouth 2 (two) times daily between meals. 08/04/21   Danford, Earl Lites, MD  furosemide (LASIX) 40 MG tablet TAKE ONE TABLET (40MG ) DAILY. MAY TAKE ASECOND DOSE OF 40MG  IF NEEDED AT LUNCH FOR 5 DAYS 08/21/23   Glori Luis, MD  gabapentin (NEURONTIN) 100 MG capsule TAKE 2 CAPSULES BY MOUTH 3 TIMES DAILY. 02/06/23   Glori Luis, MD  glucose blood test strip Use to test blood sugar twice daily. 10/11/22   Eulis Foster, FNP  guaiFENesin (MUCINEX) 600 MG 12 hr tablet Take 2 tablets (1,200 mg total) by mouth 2 (two) times daily as needed for cough or to loosen phlegm. Patient taking differently: Take 600 mg by mouth 2 (two) times daily as needed for cough or to loosen phlegm. 05/05/21   Coralyn Helling, MD  ipratropium-albuterol (DUONEB) 0.5-2.5 (3) MG/3ML SOLN TAKE 3 ML BY NEBULIZATION EVERY 4 HOURS AS NEEDED 02/20/23   Glori Luis, MD  levothyroxine (SYNTHROID) 150 MCG tablet Take 1 tablet (150 mcg total) by mouth daily before breakfast. 04/04/23   Glori Luis, MD  Lidocaine-Menthol (ICY HOT MAX  LIDOCAINE) 4-1 % CREA Apply 1 application. topically 3 (three) times daily with meals as needed. Dispense cream or spray pt preference 02/16/22   McLean-Scocuzza, Pasty Spillers, MD  Melatonin 10 MG TABS Take 10 mg by mouth at bedtime. 11/07/21   McLean-Scocuzza, Pasty Spillers, MD  mirtazapine (REMERON) 15 MG tablet Take 1 tablet (15 mg total) by mouth at bedtime. 02/28/23   Glori Luis, MD  Multiple Vitamin (MULTIVITAMIN WITH MINERALS) TABS tablet Take 1 tablet by mouth daily.     [provider]  pantoprazole (PROTONIX) 40 MG tablet Take 1 tablet (40 mg total) by mouth daily. 30 minutes before lunch or dinner 02/06/23   Glori Luis, MD  polyethylene glycol powder (GLYCOLAX/MIRALAX) 17 GM/SCOOP powder Take 17 g by mouth daily. 04/06/20   McLean-Scocuzza, Pasty Spillers, MD  polyvinyl alcohol (LIQUIFILM TEARS) 1.4 % ophthalmic solution Place 1 drop into both eyes at bedtime. 08/04/21   Danford, Earl Lites, MD  predniSONE (DELTASONE) 20 MG tablet Take 2 tablets (40 mg total) by mouth daily with breakfast. 08/14/23   Kara Dies, NP  TRELEGY ELLIPTA 100-62.5-25 MCG/ACT AEPB INHALE ONE PUFF DAILY. RINSE MOUTH AFTERUSE. STOP SYMBICORT & SPIRIVA. 05/17/23   Glori Luis, MD    Family History Family History  Problem Relation Age of Onset   Heart disease Mother    Diabetes Father    Cancer Daughter        breast ca x 2 s/p mastectomy     Social History Social History   Tobacco Use   Smoking status: Former    Current packs/day: 0.00    Average packs/day: 0.5 packs/day for 20.0 years (10.0 ttl pk-yrs)    Types: Cigarettes    Start date: 09/20/1975    Quit date: 09/20/1995    Years since quitting: 27.9   Smokeless tobacco: Never   Tobacco comments:    quit 1996 smoked 20 years max 8 cig qd   Vaping Use   Vaping status: Never Used  Substance Use Topics   Alcohol use: No   Drug use: No     Allergies   Penicillins, Sulfa antibiotics, Amitiza [lubiprostone], Aspirin, and  Penicillin g   Review of Systems Review of Systems   Physical Exam Triage Vital Signs ED Triage Vitals [08/22/23 1508]  Encounter Vitals Group     BP 137/71     Systolic BP Percentile      Diastolic BP Percentile      Pulse Rate 71     Resp 16     Temp 98.3 F (36.8 C)     Temp Source Oral     SpO2 95 %     Weight      Height      Head Circumference      Peak Flow      Pain Score      Pain Loc      Pain Education      Exclude from Growth Chart    No data found.  Updated Vital Signs BP 137/71 (BP Location: Left Arm)   Pulse 71   Temp 98.3 F (36.8 C) (Oral)   Resp 16   LMP  (LMP Unknown)   SpO2 95%   Visual Acuity Right Eye Distance:   Left Eye Distance:   Bilateral Distance:    Right Eye Near:   Left Eye Near:    Bilateral Near:     Physical Exam Constitutional:  Appearance: Normal appearance.  Eyes:     Extraocular Movements: Extraocular movements intact.  Pulmonary:     Effort: Pulmonary effort is normal.  Abdominal:     General: Abdomen is flat. Bowel sounds are normal. There is no distension.     Palpations: Abdomen is soft.     Tenderness: There is no abdominal tenderness. There is no right CVA tenderness, left CVA tenderness or guarding.  Neurological:     Mental Status: She is alert and oriented to person, place, and time. Mental status is at baseline.      UC Treatments / Results  Labs (all labs ordered are listed, but only abnormal results are displayed) Labs Reviewed  POCT URINALYSIS DIP (MANUAL ENTRY) - Abnormal; Notable for the following components:      Result Value   Clarity, UA cloudy (*)    Blood, UA moderate (*)    Protein Ur, POC =100 (*)    Nitrite, UA Positive (*)    Leukocytes, UA Large (3+) (*)    All other components within normal limits  URINE CULTURE    EKG   Radiology No results found.  Procedures Procedures (including critical care time)  Medications Ordered in UC Medications - No data to  display  Initial Impression / Assessment and Plan / UC Course  I have reviewed the triage vital signs and the nursing notes.  Pertinent labs & imaging results that were available during my care of the patient were reviewed by me and considered in my medical decision making (see chart for details).  Acute cystitis without hematuria  Urinalysis showing leukocytes and nitrates, sent for culture, culture completed on August 14, 2023 showing E. coli, Macrobid sent to pharmacy, has had success with this medicine in the past, discussed with son, patient and caregiver, advised supportive care through increase fluid intake and good hygiene measures, may follow-up for any other concerns Final Clinical Impressions(s) / UC Diagnoses   Final diagnoses:  Acute cystitis without hematuria     Discharge Instructions      Your urinalysis shows Lark Runk blood cells and nitrates which are indicative of infection, your urine will be sent to the lab to determine exactly which bacteria is present, if any changes need to be made to your medications you will be notified  Begin use of Macrobid every morning and every evening for 7 days, antibiotic was chosen based on last urine culture obtained by your primary doctor which showed E. coli  Increase your fluid intake through use of water  As always practice good hygiene, wiping front to back and avoidance of scented vaginal products to prevent further irritation  If symptoms continue to persist after use of medication or recur please follow-up with urgent care or your primary doctor as needed    ED Prescriptions     Medication Sig Dispense Auth. Provider   nitrofurantoin, macrocrystal-monohydrate, (MACROBID) 100 MG capsule Take 1 capsule (100 mg total) by mouth 2 (two) times daily for 7 days. 14 capsule Donat Humble, Elita Boone, NP      PDMP not reviewed this encounter.   Valinda Hoar, NP 08/22/23 1610

## 2023-08-22 NOTE — ED Triage Notes (Signed)
Pt presents with urinary frequency and odor that started today. Patients son states she just finished antibiotics yesterday for a UTI.

## 2023-08-22 NOTE — Telephone Encounter (Signed)
Pt daughter called stating she wanted to cancel the pt appointment because she was taken to UC regarding her urine. Pt daughter stated the pt could not wait until tomorrow. Pt daughter stated that NP Evelene Croon did not treat her mom for the UTI. Pt daughter stated that the antibiotics she gave her mom helped a little but not much. Pt daughter stated her mom get these UTIs and she did not want her mom in the ER. Pt daughter stated she was a Engineer, civil (consulting). Pt daughter also stated that Evelene Croon did not even finish the note from the 9/10 visit and she did see the chest xray. Pt daughter stated that someone need to go behind and look at what Evelene Croon is doing. Pt daughter also stated she is going to let Birdie Sons know about her experience with NP Evelene Croon because she is not happy with the visit the pt had

## 2023-08-23 ENCOUNTER — Ambulatory Visit: Payer: Medicare PPO | Admitting: Podiatry

## 2023-08-23 ENCOUNTER — Ambulatory Visit: Payer: Medicare PPO | Admitting: Nurse Practitioner

## 2023-08-23 NOTE — Telephone Encounter (Signed)
Called and spoke with Daughter after verifying DPR. I explained to daughter I would like time to view the entire note asnd chart and to speak with Dr. Birdie Sons and with NP but I would be back in touch with her a soon as I have had the chance to review.

## 2023-08-23 NOTE — Telephone Encounter (Signed)
Noted. Please let the patients daughter know that this has been forwarded to the practice administrator and will be discussed. Please follow-up with the patients daughter to see how her respiratory symptoms are doing. Has she improved with regards to that? I want to make sure of this given that the CXR was done 9 days ago and if not improving she may need some other treatment for that as well. Please let her know that our office reached out to radiology to have the CXR read today as it had not been read yet.

## 2023-08-23 NOTE — Telephone Encounter (Signed)
Spoke to son ([name])-her breathing is always that way. It was nothing new. I also informed him that the CXR was normal.  Urgent Care told them that doxycycline is not used to treat UTI's  She was treated with Macrobid by UC. Urine showed E. Coli as well.  Son is aware that message has been forwarded to Research officer, political party as well.

## 2023-08-23 NOTE — Telephone Encounter (Signed)
I have called the reading room & they will read the CXR today. Forwarded message to PCP & Prractice Admin

## 2023-08-23 NOTE — Telephone Encounter (Signed)
Pt daughter called back and stated some one called her but she did not know who it was

## 2023-08-24 LAB — URINE CULTURE: Culture: >=100000 — AB

## 2023-08-24 NOTE — Telephone Encounter (Signed)
Discussed with Henrene Pastor. Routing note back to her.

## 2023-08-24 NOTE — Telephone Encounter (Signed)
Will discuss with supervising provider and with supervising NP and with NP that  was provider for visit and will call DPR back.

## 2023-08-27 ENCOUNTER — Encounter: Payer: Self-pay | Admitting: Podiatry

## 2023-08-27 ENCOUNTER — Ambulatory Visit: Payer: Medicare PPO | Admitting: Podiatry

## 2023-08-27 VITALS — BP 137/59 | HR 68

## 2023-08-27 DIAGNOSIS — L84 Corns and callosities: Secondary | ICD-10-CM

## 2023-08-27 DIAGNOSIS — M79676 Pain in unspecified toe(s): Secondary | ICD-10-CM

## 2023-08-27 DIAGNOSIS — E1140 Type 2 diabetes mellitus with diabetic neuropathy, unspecified: Secondary | ICD-10-CM

## 2023-08-27 DIAGNOSIS — B351 Tinea unguium: Secondary | ICD-10-CM | POA: Diagnosis not present

## 2023-08-27 NOTE — Progress Notes (Unsigned)
Subjective:  Patient ID: Jessica Erickson, female    DOB: 1926-01-31,  MRN: [id number]  89 y.o. female presents at risk foot care with history of diabetic neuropathy and painful thick toenails that are difficult to trim. Pain interferes with ambulation. Aggravating factors include wearing enclosed shoe gear. Pain is relieved with periodic professional debridement.  Chief Complaint  Patient presents with   Diabetes    "Trim her toenails."    New problem(s): None   PCP is Jessica Luis, MD , and last visit was July 11, 2023.  Allergies  Allergen Reactions   Penicillins Shortness Of Breath, Rash and Other (See Comments)    Has patient had a PCN reaction causing immediate rash, facial/tongue/throat swelling, SOB or lightheadedness with hypotension: Yes Has patient had a PCN reaction causing severe rash involving mucus membranes or skin necrosis: No Has patient had a PCN reaction that required hospitalization No Has patient had a PCN reaction occurring within the last 10 years: No If all of the above answers are "NO", then may proceed with Cephalosporin use.   Sulfa Antibiotics Shortness Of Breath, Rash and Other (See Comments)   Amitiza [Lubiprostone]     N/v/d   Aspirin Other (See Comments)    Reaction:  Unknown  Other reaction(s): Bleeding (intolerance) Per patient " causes nose to bleed" Can take 81 mg daily without any complications Other reaction(s): "bloody nose"    Penicillin G Other (See Comments)    Has patient had a PCN reaction causing immediate rash, facial/tongue/throat swelling, SOB or lightheadedness with hypotension: No Has patient had a PCN reaction causing severe rash involving mucus membranes or skin necrosis: Unknown Has patient had a PCN reaction that required hospitalization: Unknown Has patient had a PCN reaction occurring within the last 10 years: Unknown If all of the above answers are "NO", then may proceed with Cephalosporin use.    Review of  Systems: Negative except as noted in the HPI.   Objective:  [name] is a pleasant 89 y.o. female in NAD.Marland Kitchen AAO x 3.  Vascular Examination: Vascular status intact b/l with palpable pedal pulses. CFT immediate b/l. Pedal hair present. No edema. No pain with calf compression b/l. Skin temperature gradient WNL b/l. No varicosities noted. No cyanosis or clubbing noted.  Neurological Examination: Sensation grossly intact b/l with 10 gram monofilament. Vibratory sensation intact b/l.  Dermatological Examination: Pedal skin with normal turgor, texture and tone b/l. No open wounds nor interdigital macerations noted. Toenails 1-5 b/l thick, discolored, elongated with subungual debris and pain on dorsal palpation.   Hyperkeratotic lesion(s) 1st metatarsal head right lower extremity.  No erythema, no edema, no drainage, no fluctuance.  Musculoskeletal Examination: Muscle strength 5/5 to b/l LE.  No pain, crepitus noted b/l. No gross pedal deformities. Patient ambulates independently without assistive aids.   Radiographs: None  Last A1c:      Latest Ref Rng & Units 04/02/2023    2:58 PM  Hemoglobin A1C  Hemoglobin-A1c 4.6 - 6.5 % 6.0      Assessment:   1. Pain due to onychomycosis of toenail   2. Callus   3. Type 2 diabetes mellitus with diabetic neuropathy, without long-term current use of insulin (HCC)    Plan:  -Consent given for treatment as described below: -Examined patient. -Continue foot and shoe inspections daily. Monitor blood glucose per PCP/Endocrinologist's recommendations. -Continue supportive shoe gear daily. -Mycotic toenails 1-5 bilaterally were debrided in length and girth with sterile nail nippers and dremel  without incident. -Callus(es) 1st metatarsal head right foot pared utilizing sterile scalpel blade without complication or incident. Total number debrided =1. -Patient/POA to call should there be question/concern in the interim.  No follow-ups on  file.  [name], DPM

## 2023-08-27 NOTE — Telephone Encounter (Signed)
Returned call to [facility] and advised I had been able to speak with patient provider and with supervising MD and that we are in process of further assisting and an advising NP further. Patient DPR thankful and advised me "I am appreciative of everyone's assistance and help in this and that she understands and is grateful for patient provider and office.

## 2023-08-30 ENCOUNTER — Other Ambulatory Visit: Payer: Self-pay | Admitting: Family Medicine

## 2023-08-30 DIAGNOSIS — E039 Hypothyroidism, unspecified: Secondary | ICD-10-CM

## 2023-09-06 DIAGNOSIS — J449 Chronic obstructive pulmonary disease, unspecified: Secondary | ICD-10-CM | POA: Diagnosis not present

## 2023-09-09 ENCOUNTER — Ambulatory Visit: Payer: Medicare PPO

## 2023-09-11 ENCOUNTER — Ambulatory Visit: Payer: Medicare PPO | Admitting: Family Medicine

## 2023-09-11 ENCOUNTER — Encounter: Payer: Self-pay | Admitting: Family Medicine

## 2023-09-11 VITALS — BP 116/70 | HR 63 | Temp 97.6°F | Ht 67.0 in | Wt 184.6 lb

## 2023-09-11 DIAGNOSIS — F320 Major depressive disorder, single episode, mild: Secondary | ICD-10-CM | POA: Insufficient documentation

## 2023-09-11 DIAGNOSIS — J441 Chronic obstructive pulmonary disease with (acute) exacerbation: Secondary | ICD-10-CM

## 2023-09-11 DIAGNOSIS — R350 Frequency of micturition: Secondary | ICD-10-CM | POA: Diagnosis not present

## 2023-09-11 DIAGNOSIS — Z23 Encounter for immunization: Secondary | ICD-10-CM

## 2023-09-11 LAB — COMPREHENSIVE METABOLIC PANEL
ALT: 12 U/L (ref 0–35)
AST: 24 U/L (ref 0–37)
Albumin: 3.4 g/dL — ABNORMAL LOW (ref 3.5–5.2)
Alkaline Phosphatase: 65 U/L (ref 39–117)
BUN: 18 mg/dL (ref 6–23)
CO2: 35 meq/L — ABNORMAL HIGH (ref 19–32)
Calcium: 8.9 mg/dL (ref 8.4–10.5)
Chloride: 101 meq/L (ref 96–112)
Creatinine, Ser: 1.35 mg/dL — ABNORMAL HIGH (ref 0.40–1.20)
GFR: 33.43 mL/min — ABNORMAL LOW (ref >60.00)
Glucose, Bld: 109 mg/dL — ABNORMAL HIGH (ref 70–99)
Potassium: 3.7 meq/L (ref 3.5–5.1)
Sodium: 143 meq/L (ref 135–145)
Total Bilirubin: 0.4 mg/dL (ref 0.2–1.2)
Total Protein: 6.1 g/dL (ref 6.0–8.3)

## 2023-09-11 LAB — POC URINALSYSI DIPSTICK (AUTOMATED)
Bilirubin, UA: NEGATIVE
Glucose, UA: NEGATIVE
Ketones, UA: NEGATIVE
Nitrite, UA: POSITIVE
Protein, UA: POSITIVE — AB
Spec Grav, UA: 1.02 (ref 1.010–1.025)
Urobilinogen, UA: 0.2 U/dL
pH, UA: 6 (ref 5.0–8.0)

## 2023-09-11 LAB — POC COVID19 BINAXNOW: SARS Coronavirus 2 Ag: NEGATIVE

## 2023-09-11 LAB — URINALYSIS, MICROSCOPIC ONLY

## 2023-09-11 MED ORDER — LEVOFLOXACIN 250 MG PO TABS
ORAL_TABLET | ORAL | 0 refills | Status: AC
Start: 2023-09-11 — End: 2023-09-16

## 2023-09-11 MED ORDER — PREDNISONE 20 MG PO TABS
40.0000 mg | ORAL_TABLET | Freq: Every day | ORAL | 0 refills | Status: DC
Start: 2023-09-11 — End: 2023-09-27

## 2023-09-11 NOTE — Assessment & Plan Note (Addendum)
Chronic issue with acute exacerbation.  We will treat her with prednisone 40 mg daily for 5 days.  We will also treat with Levaquin 500 mg on day 1 and 250 mg on days 2 through 5.  Levaquin was chosen given patient allergies and the fact that she was on doxycycline relatively recently for COPD exacerbation.  COVID test was negative.  Advised on the risk of neurological symptoms and tendon rupture with Levaquin though discussed that at this point this was the best option to use for her.

## 2023-09-11 NOTE — Patient Instructions (Signed)
Nice to see you. You likely have a UTI.  You also are likely having a COPD exacerbation.  We are going to place you on Levaquin for these things.  This antibiotic should cover for both UTI and lung infection.  I am also placing you on prednisone.  If you are unable to sleep at all on the prednisone please let us know.  Please keep an eye on your blood sugar while on the prednisone.  If it goes up significantly please let us know.  If you develop excessive diarrhea with the Levaquin please let us know. If you are not improving with either of these things within the next few days please contact us.

## 2023-09-11 NOTE — Assessment & Plan Note (Signed)
Patient with some depression.  She denies active suicidal thoughts.  She declines medication.  Discussed she may start to feel better if we get her COPD exacerbation and UTI treated.  Advised to seek medical attention by calling 911 if she develops active suicidal thoughts.

## 2023-09-11 NOTE — Addendum Note (Signed)
Addended by: Glori Luis on: 09/11/2023 12:23 PM   Modules accepted: Orders

## 2023-09-11 NOTE — Progress Notes (Signed)
[name], MD Phone: [phone]  Jessica Erickson is a 89 y.o. female who presents today for same-day visit.  UTI: Patient previously treated for UTI with Macrobid.  Family notes she has not rebounded like she typically would after treatment for UTI.  She notes no dysuria.  She does have urinary frequency and urgency.  No hematuria.  No vaginal discharge.  COPD: Patient notes feeling congested in her chest.  She has had rhinorrhea for a few days.  She has been coughing and her sputum has been thicker than usual.  They note no fevers.  No shortness of breath.  Her caregiver notes that somebody around her had COVID 3 weeks ago.  Depression: Patient's daughter notes that this seems to have been worse recently.  She is not interested in anything.  The patient has been teary.  Patient endorses depression as well.  She feels as though she is too much trouble to her family.  At times she feels as though she does not want to live though she notes no intent or thoughts of killing herself.  She does not want medication for this.  She notes she stays busy to help with this.  Social History   Tobacco Use  Smoking Status Former   Current packs/day: 0.00   Average packs/day: 0.5 packs/day for 20.0 years (10.0 ttl pk-yrs)   Types: Cigarettes   Start date: 09/20/1975   Quit date: 09/20/1995   Years since quitting: 27.9  Smokeless Tobacco Never  Tobacco Comments   quit 1996 smoked 20 years max 8 cig qd     Current Outpatient Medications on File Prior to Visit  Medication Sig Dispense Refill   Accu-Chek Softclix Lancets lancets Use as instructed 100 each 12   acetaminophen (TYLENOL) 500 MG tablet Take 2 tablets (1,000 mg total) by mouth 2 (two) times daily as needed. 360 tablet 3   albuterol (PROVENTIL) (2.5 MG/3ML) 0.083% nebulizer solution Take 3 mLs (2.5 mg total) by nebulization in the morning, at noon, and at bedtime. 360 mL 11   apixaban (ELIQUIS) 2.5 MG TABS tablet Take 1 tablet (2.5 mg  total) by mouth 2 (two) times daily. 180 tablet 3   atorvastatin (LIPITOR) 40 MG tablet Take 1 tablet (40 mg total) by mouth daily. 90 tablet 3   azithromycin (ZITHROMAX) 250 MG tablet Take 250 mg by mouth as directed.     bisacodyl (DULCOLAX) 5 MG EC tablet Take 5 mg by mouth daily as needed for moderate constipation.     Blood Glucose Monitoring Suppl (ACCU-CHEK AVIVA PLUS) w/Device KIT Use as directed 1 kit 0   calcium-vitamin D (OSCAL WITH D) 500-5 MG-MCG tablet Take 1 tablet by mouth 2 (two) times daily. Unable to find former dose 600ca/400 vitamin D inform patient dose change Oscal 180 tablet 3   cetirizine (ZYRTEC) 10 MG tablet Take 1 tablet (10 mg total) by mouth daily. 90 tablet 3   feeding supplement, GLUCERNA SHAKE, (GLUCERNA SHAKE) LIQD Take 237 mLs by mouth 2 (two) times daily between meals.  0   furosemide (LASIX) 40 MG tablet TAKE ONE TABLET (40MG ) DAILY. MAY TAKE ASECOND DOSE OF 40MG  IF NEEDED AT LUNCH FOR 5 DAYS 120 tablet 3   gabapentin (NEURONTIN) 100 MG capsule TAKE 2 CAPSULES BY MOUTH 3 TIMES DAILY. 504 capsule 3   glucose blood test strip Use to test blood sugar twice daily. 200 each 3   guaiFENesin (MUCINEX) 600 MG 12 hr tablet Take 2 tablets (1,200 mg  total) by mouth 2 (two) times daily as needed for cough or to loosen phlegm. (Patient taking differently: Take 600 mg by mouth 2 (two) times daily as needed for cough or to loosen phlegm.)     ipratropium-albuterol (DUONEB) 0.5-2.5 (3) MG/3ML SOLN TAKE 3 ML BY NEBULIZATION EVERY 4 HOURS AS NEEDED 360 mL 11   levothyroxine (SYNTHROID) 150 MCG tablet TAKE ONE TABLET BY MOUTH EVERY DAY BEFORE BREAKFAST 90 tablet 1   Lidocaine-Menthol (ICY HOT MAX LIDOCAINE) 4-1 % CREA Apply 1 application. topically 3 (three) times daily with meals as needed. Dispense cream or spray pt preference 120 g 11   Melatonin 10 MG TABS Take 10 mg by mouth at bedtime. 90 tablet 3   mirtazapine (REMERON) 15 MG tablet Take 1 tablet (15 mg total) by mouth at  bedtime. 90 tablet 3   Multiple Vitamin (MULTIVITAMIN WITH MINERALS) TABS tablet Take 1 tablet by mouth daily.      pantoprazole (PROTONIX) 40 MG tablet Take 1 tablet (40 mg total) by mouth daily. 30 minutes before lunch or dinner 90 tablet 3   polyethylene glycol powder (GLYCOLAX/MIRALAX) 17 GM/SCOOP powder Take 17 g by mouth daily. 255 g 11   polyvinyl alcohol (LIQUIFILM TEARS) 1.4 % ophthalmic solution Place 1 drop into both eyes at bedtime. 15 mL 0   TRELEGY ELLIPTA 100-62.5-25 MCG/ACT AEPB INHALE ONE PUFF DAILY. RINSE MOUTH AFTERUSE. STOP SYMBICORT & SPIRIVA. 60 each 11   No current facility-administered medications on file prior to visit.     ROS see history of present illness  Objective  Physical Exam Vitals:   09/11/23 1038  BP: 116/70  Pulse: 63  Temp: 97.6 F (36.4 C)  SpO2: 96%    BP Readings from Last 3 Encounters:  09/11/23 116/70  08/27/23 (!) 137/59  08/22/23 137/71   Wt Readings from Last 3 Encounters:  09/11/23 184 lb 9.6 oz (83.7 kg)  08/14/23 186 lb 4 oz (84.5 kg)  07/11/23 188 lb (85.3 kg)    Physical Exam Constitutional:      General: She is not in acute distress.    Appearance: She is not diaphoretic.  Cardiovascular:     Rate and Rhythm: Normal rate and regular rhythm.     Heart sounds: Normal heart sounds.  Pulmonary:     Effort: Pulmonary effort is normal.     Comments: Coarse breath sounds throughout Skin:    General: Skin is warm and dry.  Neurological:     Mental Status: She is alert.      Assessment/Plan: Please see individual problem list.  Urination frequency Assessment & Plan: Urine symptoms and urinalysis concerning for UTI.  We are treating with Levaquin for her COPD exacerbation which should also cover for UTI.  We will send urine for culture and microscopy.  Orders: -     POCT Urinalysis Dipstick (Automated) -     Comprehensive metabolic panel -     levoFLOXacin; Take 2 tablets (500 mg total) by mouth daily for 1 day,  THEN 1 tablet (250 mg total) daily for 4 days.  Dispense: 7 tablet; Refill: 0 -     Urine Microscopic -     Urine Culture  Chronic obstructive pulmonary disease with acute exacerbation (HCC) Assessment & Plan: Chronic issue with acute exacerbation.  We will treat her with prednisone 40 mg daily for 5 days.  We will also treat with Levaquin 500 mg on day 1 and 250 mg on days 2 through 5.  Levaquin was chosen given patient allergies and the fact that she was on doxycycline relatively recently for COPD exacerbation.  COVID test was negative.  Advised on the risk of neurological symptoms and tendon rupture with Levaquin though discussed that at this point this was the best option to use for her.  Orders: -     predniSONE; Take 2 tablets (40 mg total) by mouth daily with breakfast.  Dispense: 10 tablet; Refill: 0 -     levoFLOXacin; Take 2 tablets (500 mg total) by mouth daily for 1 day, THEN 1 tablet (250 mg total) daily for 4 days.  Dispense: 7 tablet; Refill: 0  Encounter for administration of vaccine -     Flu Vaccine Trivalent High Dose (Fluad)  Depression, major, single episode, mild (HCC) Assessment & Plan: Patient with some depression.  She denies active suicidal thoughts.  She declines medication.  Discussed she may start to feel better if we get her COPD exacerbation and UTI treated.  Advised to seek medical attention by calling 911 if she develops active suicidal thoughts.     Return if symptoms worsen or fail to improve.   [name], MD [facility]

## 2023-09-11 NOTE — Assessment & Plan Note (Signed)
Urine symptoms and urinalysis concerning for UTI.  We are treating with Levaquin for her COPD exacerbation which should also cover for UTI.  We will send urine for culture and microscopy.

## 2023-09-12 LAB — URINE CULTURE
MICRO NUMBER:: 15567031
SPECIMEN QUALITY:: ADEQUATE

## 2023-09-18 ENCOUNTER — Other Ambulatory Visit: Payer: Self-pay

## 2023-09-18 NOTE — Telephone Encounter (Signed)
Last refill by Dr. French Ana on 08/23/2022 Last OV: 09/11/2023 Next OV: 10/15/2023

## 2023-09-19 MED ORDER — OYSTER SHELL CALCIUM/D3 500-5 MG-MCG PO TABS: 1.0000 | ORAL_TABLET | Freq: Two times a day (BID) | ORAL | 3 refills | Status: AC

## 2023-09-26 ENCOUNTER — Ambulatory Visit: Payer: Medicare PPO | Admitting: Family Medicine

## 2023-09-26 ENCOUNTER — Observation Stay: Payer: Medicare PPO

## 2023-09-26 ENCOUNTER — Inpatient Hospital Stay
Admission: EM | Admit: 2023-09-26 | Discharge: 2023-10-01 | DRG: 177 | Disposition: A | Discharge status: Home Health | Payer: Medicare PPO | Attending: Internal Medicine | Admitting: Internal Medicine

## 2023-09-26 ENCOUNTER — Other Ambulatory Visit: Payer: Self-pay

## 2023-09-26 ENCOUNTER — Emergency Department: Payer: Medicare PPO

## 2023-09-26 DIAGNOSIS — J441 Chronic obstructive pulmonary disease with (acute) exacerbation: Secondary | ICD-10-CM | POA: Diagnosis present

## 2023-09-26 DIAGNOSIS — Z8249 Family history of ischemic heart disease and other diseases of the circulatory system: Secondary | ICD-10-CM | POA: Diagnosis not present

## 2023-09-26 DIAGNOSIS — T17590A Other foreign object in bronchus causing asphyxiation, initial encounter: Secondary | ICD-10-CM | POA: Diagnosis present

## 2023-09-26 DIAGNOSIS — J9621 Acute and chronic respiratory failure with hypoxia: Secondary | ICD-10-CM | POA: Diagnosis present

## 2023-09-26 DIAGNOSIS — J479 Bronchiectasis, uncomplicated: Secondary | ICD-10-CM | POA: Diagnosis not present

## 2023-09-26 DIAGNOSIS — R918 Other nonspecific abnormal finding of lung field: Secondary | ICD-10-CM | POA: Diagnosis not present

## 2023-09-26 DIAGNOSIS — R0600 Dyspnea, unspecified: Secondary | ICD-10-CM | POA: Diagnosis not present

## 2023-09-26 DIAGNOSIS — Z8673 Personal history of transient ischemic attack (TIA), and cerebral infarction without residual deficits: Secondary | ICD-10-CM

## 2023-09-26 DIAGNOSIS — Z9049 Acquired absence of other specified parts of digestive tract: Secondary | ICD-10-CM

## 2023-09-26 DIAGNOSIS — I2510 Atherosclerotic heart disease of native coronary artery without angina pectoris: Secondary | ICD-10-CM | POA: Diagnosis present

## 2023-09-26 DIAGNOSIS — E669 Obesity, unspecified: Secondary | ICD-10-CM | POA: Diagnosis present

## 2023-09-26 DIAGNOSIS — R739 Hyperglycemia, unspecified: Secondary | ICD-10-CM | POA: Diagnosis present

## 2023-09-26 DIAGNOSIS — Z515 Encounter for palliative care: Secondary | ICD-10-CM | POA: Diagnosis not present

## 2023-09-26 DIAGNOSIS — J9811 Atelectasis: Secondary | ICD-10-CM | POA: Diagnosis present

## 2023-09-26 DIAGNOSIS — J189 Pneumonia, unspecified organism: Secondary | ICD-10-CM | POA: Diagnosis not present

## 2023-09-26 DIAGNOSIS — J069 Acute upper respiratory infection, unspecified: Secondary | ICD-10-CM

## 2023-09-26 DIAGNOSIS — W44F9XA Other object of natural or organic material, entering into or through a natural orifice, initial encounter: Secondary | ICD-10-CM | POA: Diagnosis present

## 2023-09-26 DIAGNOSIS — G500 Trigeminal neuralgia: Secondary | ICD-10-CM | POA: Diagnosis not present

## 2023-09-26 DIAGNOSIS — I890 Lymphedema, not elsewhere classified: Secondary | ICD-10-CM | POA: Diagnosis not present

## 2023-09-26 DIAGNOSIS — Z803 Family history of malignant neoplasm of breast: Secondary | ICD-10-CM

## 2023-09-26 DIAGNOSIS — J9611 Chronic respiratory failure with hypoxia: Secondary | ICD-10-CM | POA: Diagnosis present

## 2023-09-26 DIAGNOSIS — Z87442 Personal history of urinary calculi: Secondary | ICD-10-CM

## 2023-09-26 DIAGNOSIS — Z833 Family history of diabetes mellitus: Secondary | ICD-10-CM

## 2023-09-26 DIAGNOSIS — Z85828 Personal history of other malignant neoplasm of skin: Secondary | ICD-10-CM | POA: Diagnosis not present

## 2023-09-26 DIAGNOSIS — Z7901 Long term (current) use of anticoagulants: Secondary | ICD-10-CM | POA: Diagnosis not present

## 2023-09-26 DIAGNOSIS — I130 Hypertensive heart and chronic kidney disease with heart failure and stage 1 through stage 4 chronic kidney disease, or unspecified chronic kidney disease: Secondary | ICD-10-CM | POA: Diagnosis present

## 2023-09-26 DIAGNOSIS — Z6829 Body mass index (BMI) 29.0-29.9, adult: Secondary | ICD-10-CM

## 2023-09-26 DIAGNOSIS — Z86711 Personal history of pulmonary embolism: Secondary | ICD-10-CM

## 2023-09-26 DIAGNOSIS — E890 Postprocedural hypothyroidism: Secondary | ICD-10-CM | POA: Diagnosis present

## 2023-09-26 DIAGNOSIS — Z95828 Presence of other vascular implants and grafts: Secondary | ICD-10-CM | POA: Diagnosis not present

## 2023-09-26 DIAGNOSIS — E785 Hyperlipidemia, unspecified: Secondary | ICD-10-CM | POA: Diagnosis present

## 2023-09-26 DIAGNOSIS — I5032 Chronic diastolic (congestive) heart failure: Secondary | ICD-10-CM | POA: Diagnosis present

## 2023-09-26 DIAGNOSIS — N1832 Chronic kidney disease, stage 3b: Secondary | ICD-10-CM | POA: Diagnosis present

## 2023-09-26 DIAGNOSIS — R1313 Dysphagia, pharyngeal phase: Secondary | ICD-10-CM | POA: Diagnosis present

## 2023-09-26 DIAGNOSIS — Z79899 Other long term (current) drug therapy: Secondary | ICD-10-CM

## 2023-09-26 DIAGNOSIS — Z66 Do not resuscitate: Secondary | ICD-10-CM | POA: Diagnosis present

## 2023-09-26 DIAGNOSIS — Z87891 Personal history of nicotine dependence: Secondary | ICD-10-CM | POA: Diagnosis not present

## 2023-09-26 DIAGNOSIS — D72829 Elevated white blood cell count, unspecified: Secondary | ICD-10-CM | POA: Diagnosis not present

## 2023-09-26 DIAGNOSIS — J9612 Chronic respiratory failure with hypercapnia: Secondary | ICD-10-CM | POA: Diagnosis not present

## 2023-09-26 DIAGNOSIS — B9789 Other viral agents as the cause of diseases classified elsewhere: Secondary | ICD-10-CM | POA: Diagnosis present

## 2023-09-26 DIAGNOSIS — J9622 Acute and chronic respiratory failure with hypercapnia: Secondary | ICD-10-CM | POA: Diagnosis present

## 2023-09-26 DIAGNOSIS — J90 Pleural effusion, not elsewhere classified: Secondary | ICD-10-CM | POA: Diagnosis not present

## 2023-09-26 DIAGNOSIS — R7303 Prediabetes: Secondary | ICD-10-CM | POA: Diagnosis present

## 2023-09-26 DIAGNOSIS — J471 Bronchiectasis with (acute) exacerbation: Principal | ICD-10-CM | POA: Diagnosis present

## 2023-09-26 DIAGNOSIS — R0602 Shortness of breath: Secondary | ICD-10-CM | POA: Diagnosis not present

## 2023-09-26 DIAGNOSIS — R9389 Abnormal findings on diagnostic imaging of other specified body structures: Secondary | ICD-10-CM | POA: Diagnosis not present

## 2023-09-26 DIAGNOSIS — Z882 Allergy status to sulfonamides status: Secondary | ICD-10-CM

## 2023-09-26 DIAGNOSIS — R0689 Other abnormalities of breathing: Secondary | ICD-10-CM | POA: Diagnosis not present

## 2023-09-26 DIAGNOSIS — Z880 Allergy status to penicillin: Secondary | ICD-10-CM

## 2023-09-26 DIAGNOSIS — Z886 Allergy status to analgesic agent status: Secondary | ICD-10-CM

## 2023-09-26 DIAGNOSIS — J690 Pneumonitis due to inhalation of food and vomit: Principal | ICD-10-CM | POA: Diagnosis present

## 2023-09-26 DIAGNOSIS — Z79890 Hormone replacement therapy (postmenopausal): Secondary | ICD-10-CM

## 2023-09-26 DIAGNOSIS — I700 Atherosclerosis of aorta: Secondary | ICD-10-CM | POA: Diagnosis not present

## 2023-09-26 DIAGNOSIS — Z87440 Personal history of urinary (tract) infections: Secondary | ICD-10-CM

## 2023-09-26 DIAGNOSIS — R0989 Other specified symptoms and signs involving the circulatory and respiratory systems: Secondary | ICD-10-CM | POA: Diagnosis not present

## 2023-09-26 DIAGNOSIS — Z888 Allergy status to other drugs, medicaments and biological substances status: Secondary | ICD-10-CM

## 2023-09-26 DIAGNOSIS — R0902 Hypoxemia: Secondary | ICD-10-CM | POA: Diagnosis not present

## 2023-09-26 DIAGNOSIS — H9190 Unspecified hearing loss, unspecified ear: Secondary | ICD-10-CM | POA: Diagnosis present

## 2023-09-26 DIAGNOSIS — Z86718 Personal history of other venous thrombosis and embolism: Secondary | ICD-10-CM

## 2023-09-26 LAB — MRSA NEXT GEN BY PCR, NASAL: MRSA by PCR Next Gen: NOT DETECTED

## 2023-09-26 LAB — COMPREHENSIVE METABOLIC PANEL
ALT: 14 U/L (ref 0–44)
AST: 25 U/L (ref 15–41)
Albumin: 3.4 g/dL — ABNORMAL LOW (ref 3.5–5.0)
Alkaline Phosphatase: 48 U/L (ref 38–126)
Anion gap: 9 (ref 5–15)
BUN: 14 mg/dL (ref 8–23)
CO2: 30 mmol/L (ref 22–32)
Calcium: 8 mg/dL — ABNORMAL LOW (ref 8.9–10.3)
Chloride: 101 mmol/L (ref 98–111)
Creatinine, Ser: 1.13 mg/dL — ABNORMAL HIGH (ref 0.44–1.00)
GFR, Estimated: 45 mL/min — ABNORMAL LOW (ref >60)
Glucose, Bld: 176 mg/dL — ABNORMAL HIGH (ref 70–99)
Potassium: 3.4 mmol/L — ABNORMAL LOW (ref 3.5–5.1)
Sodium: 140 mmol/L (ref 135–145)
Total Bilirubin: 1.6 mg/dL — ABNORMAL HIGH (ref 0.3–1.2)
Total Protein: 6.5 g/dL (ref 6.5–8.1)

## 2023-09-26 LAB — CBC
HCT: 38.8 % (ref 36.0–46.0)
Hemoglobin: 12.2 g/dL (ref 12.0–15.0)
MCH: 30 pg (ref 26.0–34.0)
MCHC: 31.4 g/dL (ref 30.0–36.0)
MCV: 95.6 fL (ref 80.0–100.0)
Platelets: 160 10*3/uL (ref 150–400)
RBC: 4.06 MIL/uL (ref 3.87–5.11)
RDW: 14.3 % (ref 11.5–15.5)
WBC: 17.4 10*3/uL — ABNORMAL HIGH (ref 4.0–10.5)
nRBC: 0 % (ref 0.0–0.2)

## 2023-09-26 LAB — PROCALCITONIN: Procalcitonin: <0.1 ng/mL

## 2023-09-26 LAB — RESP PANEL BY RT-PCR (RSV, FLU A&B, COVID)  RVPGX2
Influenza A by PCR: NEGATIVE
Influenza B by PCR: NEGATIVE
Resp Syncytial Virus by PCR: NEGATIVE
SARS Coronavirus 2 by RT PCR: NEGATIVE

## 2023-09-26 LAB — BRAIN NATRIURETIC PEPTIDE: B Natriuretic Peptide: 134 pg/mL — ABNORMAL HIGH (ref 0.0–100.0)

## 2023-09-26 LAB — LACTIC ACID, PLASMA: Lactic Acid, Venous: 1.2 mmol/L (ref 0.5–1.9)

## 2023-09-26 MED ORDER — BUDESONIDE 0.25 MG/2ML IN SUSP
0.2500 mg | Freq: Two times a day (BID) | RESPIRATORY_TRACT | Status: DC
  Administered 2023-09-27 – 2023-10-01 (×9): 0.25 mg via RESPIRATORY_TRACT
  Filled 2023-09-26 (×9): qty 2

## 2023-09-26 MED ORDER — SODIUM CHLORIDE 0.9% FLUSH
3.0000 mL | Freq: Two times a day (BID) | INTRAVENOUS | Status: DC
  Administered 2023-09-26 – 2023-10-01 (×10): 3 mL via INTRAVENOUS

## 2023-09-26 MED ORDER — METHYLPREDNISOLONE SODIUM SUCC 40 MG IJ SOLR: 40.0000 mg | Freq: Once | INTRAMUSCULAR | Status: DC

## 2023-09-26 MED ORDER — PANTOPRAZOLE SODIUM 40 MG PO TBEC
40.0000 mg | DELAYED_RELEASE_TABLET | Freq: Every day | ORAL | Status: DC
Start: 2023-09-27 — End: 2023-10-01
  Administered 2023-09-27 – 2023-10-01 (×5): 40 mg via ORAL
  Filled 2023-09-26 (×5): qty 1

## 2023-09-26 MED ORDER — PREDNISONE 20 MG PO TABS
40.0000 mg | ORAL_TABLET | Freq: Every day | ORAL | Status: DC
  Administered 2023-09-27 – 2023-10-01 (×5): 40 mg via ORAL
  Filled 2023-09-26 (×5): qty 2

## 2023-09-26 MED ORDER — IPRATROPIUM-ALBUTEROL 0.5-2.5 (3) MG/3ML IN SOLN
3.0000 mL | Freq: Four times a day (QID) | RESPIRATORY_TRACT | Status: DC
  Administered 2023-09-26 – 2023-09-28 (×7): 3 mL via RESPIRATORY_TRACT
  Filled 2023-09-26 (×8): qty 3

## 2023-09-26 MED ORDER — MIRTAZAPINE 15 MG PO TABS
15.0000 mg | ORAL_TABLET | Freq: Every day | ORAL | Status: DC
  Administered 2023-09-26 – 2023-09-30 (×5): 15 mg via ORAL
  Filled 2023-09-26 (×5): qty 1

## 2023-09-26 MED ORDER — ACETAMINOPHEN 325 MG PO TABS: 650.0000 mg | ORAL_TABLET | Freq: Four times a day (QID) | ORAL | Status: DC | PRN

## 2023-09-26 MED ORDER — METHYLPREDNISOLONE SODIUM SUCC 125 MG IJ SOLR
125.0000 mg | Freq: Once | INTRAMUSCULAR | Status: AC
  Administered 2023-09-26: 125 mg via INTRAVENOUS
  Filled 2023-09-26: qty 2

## 2023-09-26 MED ORDER — ACETAMINOPHEN 650 MG RE SUPP: 650.0000 mg | Freq: Four times a day (QID) | RECTAL | Status: DC | PRN

## 2023-09-26 MED ORDER — ONDANSETRON HCL 4 MG PO TABS: 4.0000 mg | ORAL_TABLET | Freq: Four times a day (QID) | ORAL | Status: DC | PRN

## 2023-09-26 MED ORDER — SODIUM CHLORIDE 0.9 % IV SOLN
500.0000 mg | INTRAVENOUS | Status: DC
  Administered 2023-09-26: 500 mg via INTRAVENOUS
  Filled 2023-09-26 (×2): qty 5

## 2023-09-26 MED ORDER — FUROSEMIDE 40 MG PO TABS
40.0000 mg | ORAL_TABLET | Freq: Every day | ORAL | Status: DC
  Administered 2023-09-27: 40 mg via ORAL
  Filled 2023-09-26: qty 1

## 2023-09-26 MED ORDER — POLYETHYLENE GLYCOL 3350 17 G PO PACK
17.0000 g | PACK | Freq: Every day | ORAL | Status: DC | PRN
Start: 2023-09-26 — End: 2023-10-01

## 2023-09-26 MED ORDER — GABAPENTIN 100 MG PO CAPS
200.0000 mg | ORAL_CAPSULE | Freq: Three times a day (TID) | ORAL | Status: DC
  Administered 2023-09-26 – 2023-10-01 (×14): 200 mg via ORAL
  Filled 2023-09-26 (×14): qty 2

## 2023-09-26 MED ORDER — INSULIN ASPART 100 UNIT/ML IJ SOLN
0.0000 [IU] | Freq: Three times a day (TID) | INTRAMUSCULAR | Status: DC
  Administered 2023-09-27 (×3): 1 [IU] via SUBCUTANEOUS
  Administered 2023-09-28: 3 [IU] via SUBCUTANEOUS
  Administered 2023-09-29 – 2023-09-30 (×3): 2 [IU] via SUBCUTANEOUS
  Filled 2023-09-26 (×8): qty 1

## 2023-09-26 MED ORDER — BISACODYL 5 MG PO TBEC: 5.0000 mg | DELAYED_RELEASE_TABLET | Freq: Every day | ORAL | Status: DC | PRN

## 2023-09-26 MED ORDER — ATORVASTATIN CALCIUM 20 MG PO TABS: 40.0000 mg | ORAL_TABLET | Freq: Every day | ORAL | Status: DC

## 2023-09-26 MED ORDER — APIXABAN 2.5 MG PO TABS
2.5000 mg | ORAL_TABLET | Freq: Two times a day (BID) | ORAL | Status: DC
  Administered 2023-09-26 – 2023-10-01 (×10): 2.5 mg via ORAL
  Filled 2023-09-26 (×12): qty 1

## 2023-09-26 MED ORDER — SODIUM CHLORIDE 0.9 % IV SOLN
2.0000 g | Freq: Once | INTRAVENOUS | Status: DC
  Filled 2023-09-26: qty 10

## 2023-09-26 MED ORDER — LEVOTHYROXINE SODIUM 50 MCG PO TABS
150.0000 ug | ORAL_TABLET | Freq: Every day | ORAL | Status: DC
  Administered 2023-09-27 – 2023-10-01 (×5): 150 ug via ORAL
  Filled 2023-09-26 (×5): qty 1

## 2023-09-26 MED ORDER — SODIUM CHLORIDE 0.9 % IV SOLN
2.0000 g | Freq: Two times a day (BID) | INTRAVENOUS | Status: DC
  Administered 2023-09-27: 2 g via INTRAVENOUS
  Filled 2023-09-26 (×3): qty 12.5

## 2023-09-26 MED ORDER — SODIUM CHLORIDE 0.9 % IV SOLN
2.0000 g | Freq: Once | INTRAVENOUS | Status: AC
  Administered 2023-09-26: 2 g via INTRAVENOUS
  Filled 2023-09-26: qty 12.5

## 2023-09-26 MED ORDER — GABAPENTIN 100 MG PO CAPS: 200.0000 mg | ORAL_CAPSULE | Freq: Three times a day (TID) | ORAL | Status: DC

## 2023-09-26 MED ORDER — ONDANSETRON HCL 4 MG/2ML IJ SOLN: 4.0000 mg | Freq: Four times a day (QID) | INTRAMUSCULAR | Status: DC | PRN

## 2023-09-26 MED ORDER — GLUCERNA SHAKE PO LIQD
237.0000 mL | Freq: Two times a day (BID) | ORAL | Status: DC
  Administered 2023-09-27 – 2023-10-01 (×5): 237 mL via ORAL

## 2023-09-26 MED ORDER — ATORVASTATIN CALCIUM 20 MG PO TABS
40.0000 mg | ORAL_TABLET | Freq: Every day | ORAL | Status: DC
  Administered 2023-09-26 – 2023-09-30 (×5): 40 mg via ORAL
  Filled 2023-09-26 (×5): qty 2

## 2023-09-26 MED ORDER — GUAIFENESIN ER 600 MG PO TB12
600.0000 mg | ORAL_TABLET | Freq: Two times a day (BID) | ORAL | Status: DC
  Administered 2023-09-26 – 2023-10-01 (×10): 600 mg via ORAL
  Filled 2023-09-26 (×10): qty 1

## 2023-09-26 MED ORDER — VANCOMYCIN HCL IN DEXTROSE 1-5 GM/200ML-% IV SOLN
1000.0000 mg | Freq: Once | INTRAVENOUS | Status: AC
  Administered 2023-09-26: 1000 mg via INTRAVENOUS
  Filled 2023-09-26: qty 200

## 2023-09-26 MED ORDER — PREDNISONE 20 MG PO TABS: 40.0000 mg | ORAL_TABLET | Freq: Every day | ORAL | Status: DC

## 2023-09-26 MED ORDER — ALBUTEROL SULFATE (2.5 MG/3ML) 0.083% IN NEBU
3.0000 mL | INHALATION_SOLUTION | RESPIRATORY_TRACT | Status: DC | PRN
  Administered 2023-09-26: 3 mL via RESPIRATORY_TRACT
  Filled 2023-09-26: qty 3

## 2023-09-26 NOTE — Assessment & Plan Note (Signed)
Per chart review, patient has a history of CKD stage IIIb, with current creatinine of 1.13 which is improved compared to her baseline.  - Continue to monitor renal indices while admitted

## 2023-09-26 NOTE — Assessment & Plan Note (Signed)
VBG with pCO2 of 58 however patient's pH is within normal limits and her mental status is currently at baseline.  - Continue home 2 L of supplemental oxygen

## 2023-09-26 NOTE — Assessment & Plan Note (Signed)
Patient's BNP is elevated at 134, however on examination there is only peripheral edema likely due to known lymphedema.  No other evidence of hypervolemia at this time.  - Resume home Lasix tomorrow - Daily weights

## 2023-09-26 NOTE — Consult Note (Signed)
PHARMACY -  BRIEF ANTIBIOTIC NOTE   Pharmacy has received consult(s) for HAP from an ED provider.  The patient's profile has been reviewed for ht/wt/allergies/indication/available labs.    One time order(s) placed for aztreonam and vancomycin. Pt has received cefepime in the past, so switched aztreonam to cefepime.   Further antibiotics/pharmacy consults should be ordered by admitting physician if indicated.                       Thank you, Ronnald Ramp 09/26/2023  3:39 PM

## 2023-09-26 NOTE — Assessment & Plan Note (Signed)
Hyperglycemia noted, and in light of steroid therapy she may ultimately require SSI.  Last A1c of 6.0% 5 months ago  - CBG monitoring - SSI, sensitive

## 2023-09-26 NOTE — Assessment & Plan Note (Signed)
Patient is presenting with increased cough, shortness of breath and thick sputum that is likely multifactorial in the setting of acute COPD exacerbation as well as bronchiectasis.  She has been on doxycycline and levofloxacin recently with symptoms quickly recurring.  - Continue 2 L of supplemental oxygen - Maintain oxygen saturation above 88% - DuoNebs every 6 hours - Pulmicort nebulizers twice daily - Pulmonary toilet - Antimicrobials as noted above - Hold home bronchodilators for now

## 2023-09-26 NOTE — Assessment & Plan Note (Signed)
Patient is presenting with increased cough, shortness of breath and thick sputum that is brown in color concerning for an acute bronchiectasis flare.  Despite treatment with doxycycline in September and recent levofloxacin, her symptoms quickly recur.  Procalcitonin is negative however this may be a false negative in the setting of recent antibiotics and steroids.  MRSA PCR negative  - CT of the chest ordered for further characterization: Results pending - Continue cefepime per pharmacy dosing given high risk for pseudomonas - Add azithromycin - Expectorated sputum culture - Strep pneumo Legionella urinary antigens - Full respiratory viral panel - Pulmonary toilet with flutter valve

## 2023-09-26 NOTE — ED Triage Notes (Addendum)
Pt arrived via EMS from home where she lives with family d/t SOB and weakness over the past few days. Pt is on 2L O2 chronically for COPD, has been coughing up brown sputum as well. Pt denies any pain at this time. Pt is non-verbal and has to read hand writing to understand. Pt has caregiver with her in triage

## 2023-09-26 NOTE — H&P (Signed)
History and Physical    Patient: Jessica Erickson:[id number] DOB: 03/29/1926 DOA: 09/26/2023 DOS: the patient was seen and examined on 09/26/2023 PCP: Glori Luis, MD  Patient coming from: Home  Chief Complaint:  Chief Complaint  Patient presents with   Shortness of Breath   HPI: Jessica Erickson is a 89 y.o. 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  History obtained both from patient and from her son via telephone.  Jessica Erickson states that she has been experiencing increasing shortness of breath and generalized weakness for the last several days.  She endorses a cough that is productive of brown sputum.  She denies any other symptoms including chest pain, palpitations.   Patient's son states that she has a baseline cough but it has increased although she has been on numerous rounds of antibiotics (both for COPD exacerbation and UTI).  They were hoping to wait for her PCP appointment tomorrow, but earlier today [name] felt very weak and asked to come to the ED, which is unusual for her.  He denies noting any fevers at home.  She was on a round of antibiotics for UTI as well as prednisone last doses were over 10 days ago.  ED course: On arrival to the ED, patient was normotensive at 129/57 with heart rate of 78.  She was saturating at 96% on her home 2 L with tachypnea of 30/minute.  She was afebrile at 98.3.  Initial workup notable for WBC of 17.4, potassium 3.4, glucose 176, creatinine 1.13, GFR of 45.  Lactic acid 1.2.  Chest x-ray was obtained that demonstrated a stable exam.  Patient started on vancomycin and aztreonam.  [facility] contacted for admission.  Review of Systems: As mentioned in the history of present illness. All other systems reviewed and are negative.  Past Medical  History:  Diagnosis Date   Acoustic neuroma (HCC)    Allergy    Asthma    Bilateral swelling of feet    and legs   Bladder infection    CAD (coronary artery disease)    Cataract    Change in voice    Compression fracture of body of thoracic vertebra (HCC)    T12 09/18/15 MRI s/p fall    Constipation    COPD (chronic obstructive pulmonary disease) (HCC)    previous CXR with chronic interstitial lung dz    CVA (cerebral vascular accident) (HCC)    Depression    Diabetes (HCC)    with neuropathy   Diabetes mellitus, type 2 (HCC)    Diarrhea    Double vision    DVT (deep venous thrombosis) (HCC)    right leg 10/2015 was on coumadin off as of 2017/2018 ; s/p IVC filter   Enuresis    Eye pain, right    Fall    Fatty liver    09/15/15 also mildly dilated pancreatitic duct rec MRCP small sub cm cyst hemangioma speeln mild right hydronephrorossi and prox. hydroureter, kidney stones, mild scarring kidneys   Female stress incontinence    Flank pain    GERD (gastroesophageal reflux disease)    with small hiatal hernia    Hard of hearing    Heart disease    History of kidney problems    Hyperlipidemia    mixed   Hypertension  Hypothyroidism, postsurgical    Impaired mobility and ADLs    uses rolling walker has caretaker 24/7 at home   Leg edema    Mixed incontinence urge and stress (female)(female)    Neuropathy    Osteoarthritis    DDD spine    Osteoporosis with fracture    T12 compression fracture   Photophobia    Pulmonary embolism (HCC)    10/2015 off coumadin as of 04/2016   Pulmonary HTN (HCC)    mild pulm HTN, echo 10/09/15 EF 55-60%grade 1 dd, RV systolic pressure increased    Recurrent UTI    Sinus pressure    Skin cancer    BCC jawline and scalp    Thyroid disease    follows [facility]   TIA (transient ischemic attack)    MRI 2009/2010 neg stroke    Trigeminal neuralgia    Dr. Cammie Mcgee s/p gamma knife x 2, on Tegretol since 2011/2012 no increase in dose  >200 mg bid rec per family per neurology    Urinary frequency    Urinary, incontinence, stress female    Dr Apolinar Junes urology    Past Surgical History:  Procedure Laterality Date   APPENDECTOMY     as a child, open   BRAIN SURGERY     schwnnoma removal 1996    brain tumor surgery     BREAST SURGERY     breast bx   CATARACT EXTRACTION     CHOLECYSTECTOMY     EYE SURGERY     cataract   IVC FILTER PLACEMENT ([facility] HX)     Dr. Wyn Quaker 10/2015    LAPAROSCOPIC TUBAL LIGATION     MOHS SURGERY     scalp 04/2014    PERIPHERAL VASCULAR CATHETERIZATION N/A 10/11/2015   Procedure: IVC Filter Insertion;  Surgeon: Annice Needy, MD;  Location: [facility] INVASIVE CV LAB;  Service: Cardiovascular;  Laterality: N/A;   PERIPHERAL VASCULAR THROMBECTOMY Bilateral 03/29/2018   Procedure: PERIPHERAL VASCULAR THROMBECTOMY;  Surgeon: Annice Needy, MD;  Location: [facility] INVASIVE CV LAB;  Service: Cardiovascular;  Laterality: Bilateral;   PUBOVAGINAL SLING     THROAT SURGERY     THYROID SURGERY     tumor around vocal cords    TOOTH EXTRACTION     winter 2018    TOTAL THYROIDECTOMY  1976   Social History:  reports that she quit smoking about 28 years ago. Her smoking use included cigarettes. She started smoking about 48 years ago. She has a 10 pack-year smoking history. She has never used smokeless tobacco. She reports that she does not drink alcohol and does not use drugs.  Allergies  Allergen Reactions   Penicillins Shortness Of Breath, Rash and Other (See Comments)    Has patient had a PCN reaction causing immediate rash, facial/tongue/throat swelling, SOB or lightheadedness with hypotension: Yes Has patient had a PCN reaction causing severe rash involving mucus membranes or skin necrosis: No Has patient had a PCN reaction that required hospitalization No Has patient had a PCN reaction occurring within the last 10 years: No If all of the above answers are "NO", then may proceed with Cephalosporin use.   Sulfa  Antibiotics Shortness Of Breath, Rash and Other (See Comments)   Amitiza [Lubiprostone]     N/v/d   Aspirin Other (See Comments)    Reaction:  Unknown  Other reaction(s): Bleeding (intolerance) Per patient " causes nose to bleed" Can take 81 mg daily without any complications Other reaction(s): "bloody nose"  Penicillin G Other (See Comments)    Has patient had a PCN reaction causing immediate rash, facial/tongue/throat swelling, SOB or lightheadedness with hypotension: No Has patient had a PCN reaction causing severe rash involving mucus membranes or skin necrosis: Unknown Has patient had a PCN reaction that required hospitalization: Unknown Has patient had a PCN reaction occurring within the last 10 years: Unknown If all of the above answers are "NO", then may proceed with Cephalosporin use.    Family History  Problem Relation Age of Onset   Heart disease Mother    Diabetes Father    Cancer Daughter        breast ca x 2 s/p mastectomy     Prior to Admission medications   Medication Sig Start Date End Date Taking? Authorizing Provider  Accu-Chek Softclix Lancets lancets Use as instructed 10/11/22   Worthy Rancher B, FNP  acetaminophen (TYLENOL) 500 MG tablet Take 2 tablets (1,000 mg total) by mouth 2 (two) times daily as needed. 09/29/21   McLean-Scocuzza, Pasty Spillers, MD  albuterol (PROVENTIL) (2.5 MG/3ML) 0.083% nebulizer solution Take 3 mLs (2.5 mg total) by nebulization in the morning, at noon, and at bedtime. 01/25/22   McLean-Scocuzza, Pasty Spillers, MD  apixaban (ELIQUIS) 2.5 MG TABS tablet Take 1 tablet (2.5 mg total) by mouth 2 (two) times daily. 02/28/23   Glori Luis, MD  atorvastatin (LIPITOR) 40 MG tablet Take 1 tablet (40 mg total) by mouth daily. 04/18/23   Glori Luis, MD  azithromycin (ZITHROMAX) 250 MG tablet Take 250 mg by mouth as directed. 12/26/22   [provider]  bisacodyl (DULCOLAX) 5 MG EC tablet Take 5 mg by mouth daily as needed for moderate  constipation.    [provider]  Blood Glucose Monitoring Suppl (ACCU-CHEK AVIVA PLUS) w/Device KIT Use as directed 09/22/21   McLean-Scocuzza, Pasty Spillers, MD  calcium-vitamin D (OSCAL WITH D) 500-5 MG-MCG tablet Take 1 tablet by mouth 2 (two) times daily. Unable to find former dose 600ca/400 vitamin D inform patient dose change Oscal 09/19/23   Glori Luis, MD  cetirizine (ZYRTEC) 10 MG tablet Take 1 tablet (10 mg total) by mouth daily. 02/28/23   Glori Luis, MD  feeding supplement, GLUCERNA SHAKE, (GLUCERNA SHAKE) LIQD Take 237 mLs by mouth 2 (two) times daily between meals. 08/04/21   Danford, Earl Lites, MD  furosemide (LASIX) 40 MG tablet TAKE ONE TABLET (40MG ) DAILY. MAY TAKE ASECOND DOSE OF 40MG  IF NEEDED AT LUNCH FOR 5 DAYS 08/21/23   Glori Luis, MD  gabapentin (NEURONTIN) 100 MG capsule TAKE 2 CAPSULES BY MOUTH 3 TIMES DAILY. 02/06/23   Glori Luis, MD  glucose blood test strip Use to test blood sugar twice daily. 10/11/22   Eulis Foster, FNP  guaiFENesin (MUCINEX) 600 MG 12 hr tablet Take 2 tablets (1,200 mg total) by mouth 2 (two) times daily as needed for cough or to loosen phlegm. Patient taking differently: Take 600 mg by mouth 2 (two) times daily as needed for cough or to loosen phlegm. 05/05/21   Coralyn Helling, MD  ipratropium-albuterol (DUONEB) 0.5-2.5 (3) MG/3ML SOLN TAKE 3 ML BY NEBULIZATION EVERY 4 HOURS AS NEEDED 02/20/23   Glori Luis, MD  levothyroxine (SYNTHROID) 150 MCG tablet TAKE ONE TABLET BY MOUTH EVERY DAY BEFORE BREAKFAST 08/30/23   Glori Luis, MD  Lidocaine-Menthol (ICY HOT MAX LIDOCAINE) 4-1 % CREA Apply 1 application. topically 3 (three) times daily with meals as needed. Dispense  cream or spray pt preference 02/16/22   McLean-Scocuzza, Pasty Spillers, MD  Melatonin 10 MG TABS Take 10 mg by mouth at bedtime. 11/07/21   McLean-Scocuzza, Pasty Spillers, MD  mirtazapine (REMERON) 15 MG tablet Take 1 tablet (15 mg total) by mouth at bedtime.  02/28/23   Glori Luis, MD  Multiple Vitamin (MULTIVITAMIN WITH MINERALS) TABS tablet Take 1 tablet by mouth daily.     [provider]  pantoprazole (PROTONIX) 40 MG tablet Take 1 tablet (40 mg total) by mouth daily. 30 minutes before lunch or dinner 02/06/23   Glori Luis, MD  polyethylene glycol powder (GLYCOLAX/MIRALAX) 17 GM/SCOOP powder Take 17 g by mouth daily. 04/06/20   McLean-Scocuzza, Pasty Spillers, MD  polyvinyl alcohol (LIQUIFILM TEARS) 1.4 % ophthalmic solution Place 1 drop into both eyes at bedtime. 08/04/21   Danford, Earl Lites, MD  predniSONE (DELTASONE) 20 MG tablet Take 2 tablets (40 mg total) by mouth daily with breakfast. 09/11/23   Glori Luis, MD  TRELEGY ELLIPTA 100-62.5-25 MCG/ACT AEPB INHALE ONE PUFF DAILY. RINSE MOUTH AFTERUSE. STOP SYMBICORT & SPIRIVA. 05/17/23   Glori Luis, MD    Physical Exam: Vitals:   09/26/23 1700 09/26/23 1730 09/26/23 1800 09/26/23 1930  BP: 109/60 (!) 111/55 112/63 (!) 138/59  Pulse: 71 69 71 69  Resp: 19 (!) 27 (!) 25 (!) 28  Temp:      TempSrc:      SpO2: 95% 96% 95% 98%  Weight:      Height:       Physical Exam Vitals and nursing note reviewed.  Constitutional:      General: She is not in acute distress.    Appearance: She is obese. She is not toxic-appearing.  HENT:     Head: Normocephalic and atraumatic.     Right Ear: Decreased hearing noted.     Left Ear: Decreased hearing noted.     Mouth/Throat:     Pharynx: Oropharynx is clear.     Comments: Very dry oropharynx Eyes:     Extraocular Movements: Extraocular movements intact.     Pupils: Pupils are equal, round, and reactive to light.  Neck:     Vascular: No JVD.  Cardiovascular:     Rate and Rhythm: Normal rate and regular rhythm.     Heart sounds: No murmur heard. Pulmonary:     Effort: Pulmonary effort is normal. Tachypnea present. No accessory muscle usage or respiratory distress.     Breath sounds: Wheezing (Intermittent expiratory  wheezing) and rhonchi (Diffuse throughout) present. No decreased breath sounds or rales.  Abdominal:     Palpations: Abdomen is soft.     Tenderness: There is no abdominal tenderness. There is no guarding.  Musculoskeletal:     Comments: +2 pitting edema of the lower extremities  Neurological:     Mental Status: She is alert and oriented to person, place, and time.  Psychiatric:        Mood and Affect: Mood normal.        Behavior: Behavior normal.    Data Reviewed: CBC with WBC of 17.4, hemoglobin of 12.2, platelets of 160 CMP with sodium of 140, potassium 3.4, bicarb 30, glucose 170, BUN 14, creatinine 1.13, calcium 8.0, albumin 3.4, total bilirubin 1.6 and GFR of 45 BNP 134 Lactic acid 1.2 Procalcitonin less than 0.10 COVID-19, RSV and influenza PCR negative VBG with pH of 7.38 and pCO2 58 MRSA PCR negative   EKG personally reviewed.  Sinus  rhythm with rate of 78.  PAC noted.  Nonspecific repolarization changes.  DG Chest 2 View  Result Date: 09/26/2023 CLINICAL DATA:  Shortness of breath. EXAM: CHEST - 2 VIEW COMPARISON:  08/14/2023. FINDINGS: Low lung volume. Redemonstration of elevated right hemidiaphragm. There are areas of atelectasis/scarring at the lung bases, essentially similar to the prior study. No acute consolidation or lung collapse. Redemonstration of increased interstitial markings throughout bilateral lungs, likely representing underlying chronic lung parenchymal changes. No overt pulmonary edema. Stable cardio-mediastinal silhouette. No acute osseous abnormalities. The soft tissues are within normal limits. IMPRESSION: *Essentially stable exam.  No active cardiopulmonary disease. Electronically Signed   By: Jules Schick M.D.   On: 09/26/2023 16:06    CT of the chest pending  Results are pending, will review when available.  Assessment and Plan:  * Acute exacerbation of bronchiectasis (HCC) Patient is presenting with increased cough, shortness of breath and  thick sputum that is brown in color concerning for an acute bronchiectasis flare.  Despite treatment with doxycycline in September and recent levofloxacin, her symptoms quickly recur.  Procalcitonin is negative however this may be a false negative in the setting of recent antibiotics and steroids.  MRSA PCR negative  - CT of the chest ordered for further characterization: Results pending - Continue cefepime per pharmacy dosing given high risk for pseudomonas - Add azithromycin - Expectorated sputum culture - Strep pneumo Legionella urinary antigens - Full respiratory viral panel - Pulmonary toilet with flutter valve  COPD with acute exacerbation (HCC) Patient is presenting with increased cough, shortness of breath and thick sputum that is likely multifactorial in the setting of acute COPD exacerbation as well as bronchiectasis.  She has been on doxycycline and levofloxacin recently with symptoms quickly recurring.  - Continue 2 L of supplemental oxygen - Maintain oxygen saturation above 88% - DuoNebs every 6 hours - Pulmicort nebulizers twice daily - Pulmonary toilet - Antimicrobials as noted above - Hold home bronchodilators for now  Chronic respiratory failure with hypoxia and hypercapnia (HCC) VBG with pCO2 of 58 however patient's pH is within normal limits and her mental status is currently at baseline.  - Continue home 2 L of supplemental oxygen  Chronic heart failure with preserved ejection fraction (HFpEF) (HCC) Patient's BNP is elevated at 134, however on examination there is only peripheral edema likely due to known lymphedema.  No other evidence of hypervolemia at this time.  - Resume home Lasix tomorrow - Daily weights  Prediabetes Hyperglycemia noted, and in light of steroid therapy she may ultimately require SSI.  Last A1c of 6.0% 5 months ago  - CBG monitoring - SSI, sensitive  Chronic kidney disease, stage 3b (HCC) Per chart review, patient has a history of CKD  stage IIIb, with current creatinine of 1.13 which is improved compared to her baseline.  - Continue to monitor renal indices while admitted  Lymphedema Bilateral lymphedema noted  - SCDs ordered  Trigeminal neuralgia - Continue home gabapentin  Advance Care Planning:   Code Status: Limited: Do not attempt resuscitation (DNR) -DNR-LIMITED -Do Not Intubate/DNI as confirmed by patient's son, who is her healthcare power of attorney.  He is amenable to BiPAP, and medications as needed but would not want any invasive treatments  Consults: None  Family Communication: Patient's son updated via telephone  Severity of Illness: The appropriate patient status for this patient is OBSERVATION. Observation status is judged to be reasonable and necessary in order to provide the required intensity of service  to ensure the patient's safety. The patient's presenting symptoms, physical exam findings, and initial radiographic and laboratory data in the context of their medical condition is felt to place them at decreased risk for further clinical deterioration. Furthermore, it is anticipated that the patient will be medically stable for discharge from the hospital within 2 midnights of admission.   Author: Verdene Lennert, MD 09/26/2023 8:15 PM  For on call review [email or web address].

## 2023-09-26 NOTE — Assessment & Plan Note (Addendum)
Bilateral lymphedema noted  - SCDs ordered

## 2023-09-26 NOTE — Progress Notes (Signed)
Pharmacy Antibiotic Note  Jessica Erickson is a 89 y.o. female admitted on 09/26/2023 with worsening shortness of breath and cough. PMH significant for COPD, T2DM, HTN, HLD. In ED, patient remains afebrile with WBC 17.4. Lactic acid and procalcitonin both WNL. Pharmacy has been consulted for cefepime dosing.  Plan: Start cefepime 2 g IV every 12 hours Monitor renal function, clinical status, culture data, and LOT  Height: 5\' 7"  (170.2 cm) Weight: 87.7 kg (193 lb 5.5 oz) IBW/kg (Calculated) : 61.6  Temp (24hrs), Avg:98.7 F (37.1 C), Min:98.3 F (36.8 C), Max:99.1 F (37.3 C)  Recent Labs  Lab 09/26/23 1252 09/26/23 1527  WBC 17.4*  --   CREATININE 1.13*  --   LATICACIDVEN  --  1.2    Estimated Creatinine Clearance: 33.8 mL/min (A) (by C-G formula based on SCr of 1.13 mg/dL (H)).    Allergies  Allergen Reactions   Penicillins Shortness Of Breath, Rash and Other (See Comments)    Has patient had a PCN reaction causing immediate rash, facial/tongue/throat swelling, SOB or lightheadedness with hypotension: Yes Has patient had a PCN reaction causing severe rash involving mucus membranes or skin necrosis: No Has patient had a PCN reaction that required hospitalization No Has patient had a PCN reaction occurring within the last 10 years: No If all of the above answers are "NO", then may proceed with Cephalosporin use.   Sulfa Antibiotics Shortness Of Breath, Rash and Other (See Comments)   Amitiza [Lubiprostone]     N/v/d   Aspirin Other (See Comments)    Reaction:  Unknown  Other reaction(s): Bleeding (intolerance) Per patient " causes nose to bleed" Can take 81 mg daily without any complications Other reaction(s): "bloody nose"    Penicillin G Other (See Comments)    Has patient had a PCN reaction causing immediate rash, facial/tongue/throat swelling, SOB or lightheadedness with hypotension: No Has patient had a PCN reaction causing severe rash involving mucus membranes or  skin necrosis: Unknown Has patient had a PCN reaction that required hospitalization: Unknown Has patient had a PCN reaction occurring within the last 10 years: Unknown If all of the above answers are "NO", then may proceed with Cephalosporin use.   Antimicrobials this admission: Vancomycin 10/23 x1 Cefepime 10/23 >>   Dose adjustments this admission: N/A  Microbiology results: 10/23 BCx: pending 10/23 MRSA PCR: pending  Thank you for involving pharmacy in this patient's care.   Rockwell Alexandria, PharmD Clinical Pharmacist 09/26/2023 6:59 PM

## 2023-09-26 NOTE — Assessment & Plan Note (Signed)
- 

## 2023-09-26 NOTE — ED Provider Notes (Signed)
[facility] Provider Note    Event Date/Time   First MD Initiated Contact with Patient 09/26/23 1511     (approximate)  History   Chief Complaint: Shortness of Breath  HPI  Jessica Erickson is a 89 y.o. female with a past medical history of COPD, diabetes, gastric reflux, hypertension hyperlipidemia, TIAs, pulmonary hypertension, on 2 L of O2 chronically who presents to the emergency department for several days of worsening shortness of breath cough congestion.  According to the son who lives with the patient for the past several days the patient has been coughing with thick brown secretions.  He states they were trying to wait until tomorrow when the patient had a PCP appointment but today patient was very weak and somnolent with continued cough and appeared short of breath so they called EMS to bring the patient to the emergency department.  No chest pain.  No reported fever 99.1 in the emergency department.  Patient satting 90 to 93% on 2 L which is her baseline O2 requirement.  Son states the patient was recently on antibiotics for a urinary tract infection as well as prednisone but both of these stopped greater than 10 days ago.  Patient is deaf but can read and comprehend well.   Physical Exam   Triage Vital Signs: ED Triage Vitals  Encounter Vitals Group     BP 09/26/23 1216 (!) 140/62     Systolic BP Percentile --      Diastolic BP Percentile --      Pulse Rate 09/26/23 1216 76     Resp 09/26/23 1216 20     Temp 09/26/23 1216 99.1 F (37.3 C)     Temp Source 09/26/23 1216 Oral     SpO2 09/26/23 1216 93 %     Weight --      Height --      Head Circumference --      Peak Flow --      Pain Score 09/26/23 1217 0     Pain Loc --      Pain Education --      Exclude from Growth Chart --     Most recent vital signs: Vitals:   09/26/23 1216  BP: (!) 140/62  Pulse: 76  Resp: 20  Temp: 99.1 F (37.3 C)  SpO2: 93%    General: Awake, no distress.   CV:  Good peripheral perfusion.  Regular rate and rhythm  Resp:  Normal effort.  Equal breath sounds bilaterally.  Abd:  No distention.  Soft, nontender.  No rebound or guarding.  ED Results / Procedures / Treatments   EKG  EKG viewed and interpreted by myself shows a sinus rhythm at 78 bpm with a narrow QRS, normal axis, normal intervals, nonspecific but no concerning ST changes.  RADIOLOGY  I have reviewed and interpreted chest x-ray images.  Patient had a very elevated right hemidiaphragm however this appears to be unchanged from prior imaging.  There does appear however to be more consolidation layering on the diaphragm on the right lower lung possibly pleural effusion possible infection.  Awaiting radiology read.   MEDICATIONS ORDERED IN ED: Medications  albuterol (PROVENTIL) (2.5 MG/3ML) 0.083% nebulizer solution 3 mL (3 mLs Inhalation Given 09/26/23 1445)  vancomycin (VANCOCIN) IVPB 1000 mg/200 mL premix (has no administration in time range)  aztreonam (AZACTAM) 2 g in sodium chloride 0.9 % 100 mL IVPB (has no administration in time range)     IMPRESSION /  MDM / ASSESSMENT AND PLAN / ED COURSE  I reviewed the triage vital signs and the nursing notes.  Patient's presentation is most consistent with acute presentation with potential threat to life or bodily function.  Patient presents emergency department for worsening cough congestion now with weakness.  Patient satting between 90 and 93% on her 2 L which is her baseline O2 requirement.  Chest x-ray is concerning for possible consolidation in the right lower lobe and possibly left lower lobe.  Patient's labs today show significant leukocytosis of 17,000.  I doubt this is related to prednisone as the son states it has been at least 10 days since the patient last took steroids.  Chemistry shows no significant findings.  Given the patient's chest x-ray findings worsening cough congestion now with brown sputum production worsening  weakness we will cover the patient with IV antibiotics.  We will plan to admit to the hospital service for further workup and treatment.  Patient agreeable to plan of care and workup.  Patient's lactic acid is resulted normal.  Chest x-ray is read as stable.  However given the patient's worsening cough congestion sputum production leukocytosis we will admit to the hospitalist for IV antibiotics.  FINAL CLINICAL IMPRESSION(S) / ED DIAGNOSES   Pneumonia Leukocytosis URI    Note:  This document was prepared using Dragon voice recognition software and may include unintentional dictation errors.   Minna Antis, MD 09/26/23 [phone]

## 2023-09-27 ENCOUNTER — Ambulatory Visit: Payer: Medicare PPO | Admitting: Family

## 2023-09-27 DIAGNOSIS — J471 Bronchiectasis with (acute) exacerbation: Secondary | ICD-10-CM | POA: Diagnosis not present

## 2023-09-27 LAB — BASIC METABOLIC PANEL
Anion gap: 9 (ref 5–15)
BUN: 20 mg/dL (ref 8–23)
CO2: 29 mmol/L (ref 22–32)
Calcium: 8.1 mg/dL — ABNORMAL LOW (ref 8.9–10.3)
Chloride: 101 mmol/L (ref 98–111)
Creatinine, Ser: 1.25 mg/dL — ABNORMAL HIGH (ref 0.44–1.00)
GFR, Estimated: 40 mL/min — ABNORMAL LOW (ref >60)
Glucose, Bld: 233 mg/dL — ABNORMAL HIGH (ref 70–99)
Potassium: 3.7 mmol/L (ref 3.5–5.1)
Sodium: 139 mmol/L (ref 135–145)

## 2023-09-27 LAB — RESPIRATORY PANEL BY PCR

## 2023-09-27 LAB — CBC
HCT: 35 % — ABNORMAL LOW (ref 36.0–46.0)
Hemoglobin: 11.3 g/dL — ABNORMAL LOW (ref 12.0–15.0)
MCH: 30.3 pg (ref 26.0–34.0)
MCHC: 32.3 g/dL (ref 30.0–36.0)
MCV: 93.8 fL (ref 80.0–100.0)
Platelets: 136 10*3/uL — ABNORMAL LOW (ref 150–400)
RBC: 3.73 MIL/uL — ABNORMAL LOW (ref 3.87–5.11)
RDW: 14.6 % (ref 11.5–15.5)
WBC: 20.2 10*3/uL — ABNORMAL HIGH (ref 4.0–10.5)
nRBC: 0 % (ref 0.0–0.2)

## 2023-09-27 LAB — GLUCOSE, CAPILLARY
Glucose-Capillary: 184 mg/dL — ABNORMAL HIGH (ref 70–99)
Glucose-Capillary: 172 mg/dL — ABNORMAL HIGH (ref 70–99)
Glucose-Capillary: 193 mg/dL — ABNORMAL HIGH (ref 70–99)
Glucose-Capillary: 205 mg/dL — ABNORMAL HIGH (ref 70–99)

## 2023-09-27 LAB — MAGNESIUM: Magnesium: 2.1 mg/dL (ref 1.7–2.4)

## 2023-09-27 MED ORDER — METRONIDAZOLE 500 MG/100ML IV SOLN
500.0000 mg | Freq: Two times a day (BID) | INTRAVENOUS | Status: DC
  Administered 2023-09-27 – 2023-10-01 (×8): 500 mg via INTRAVENOUS
  Filled 2023-09-27 (×9): qty 100

## 2023-09-27 MED ORDER — METRONIDAZOLE 500 MG PO TABS
500.0000 mg | ORAL_TABLET | Freq: Two times a day (BID) | ORAL | Status: DC
  Filled 2023-09-27: qty 1

## 2023-09-27 MED ORDER — SODIUM CHLORIDE 0.9 % IV SOLN
1.0000 g | INTRAVENOUS | Status: DC
  Administered 2023-09-27 – 2023-09-30 (×4): 1 g via INTRAVENOUS
  Filled 2023-09-27 (×5): qty 10

## 2023-09-27 MED ORDER — ACETYLCYSTEINE 20 % IN SOLN
3.0000 mL | Freq: Two times a day (BID) | RESPIRATORY_TRACT | Status: DC
  Administered 2023-09-28 – 2023-10-01 (×7): 3 mL via RESPIRATORY_TRACT
  Filled 2023-09-27 (×9): qty 4

## 2023-09-27 NOTE — Progress Notes (Signed)
Progress Note   Patient: Jessica Erickson VZD:[id number] DOB: 09-Feb-1926 DOA: 09/26/2023     0 DOS: the patient was seen and examined on 09/27/2023   Brief hospital course:   Jessica Erickson is a 89 y.o. 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   History obtained both from patient and from her son via telephone.  Jessica Erickson states that she has been experiencing increasing shortness of breath and generalized weakness for the last several days.  She endorses a cough that is productive of brown sputum.  She denies any other symptoms including chest pain, palpitations.    Patient's son states that she has a baseline cough but it has increased although she has been on numerous rounds of antibiotics (both for COPD exacerbation and UTI).  They were hoping to wait for her PCP appointment tomorrow, but earlier today Jessica Erickson felt very weak and asked to come to the ED, which is unusual for her.  He denies noting any fevers at home.  She was on a round of antibiotics for UTI as well as prednisone last doses were over 10 days ago.   ED course: On arrival to the ED, patient was normotensive at 129/57 with heart rate of 78.  She was saturating at 96% on her home 2 L with tachypnea of 30/minute.  She was afebrile at 98.3.  Initial workup notable for WBC of 17.4, potassium 3.4, glucose 176, creatinine 1.13, GFR of 45.  Lactic acid 1.2.  Chest x-ray was obtained that demonstrated a stable exam.  Patient started on vancomycin and aztreonam.  TRH contacted for admission.      Assessment and Plan:  Aspiration pneumonia  Acute exacerbation of bronchiectasis (HCC) Patient  presented with increased cough, shortness of breath and thick sputum that is brown in color concerning for an acute bronchiectasis flare.  Despite  treatment with doxycycline in September and recent levofloxacin, her symptoms quickly recur.  Procalcitonin is negative however this may be a false negative in the setting of recent antibiotics and steroids.  MRSA PCR negative CT scan of the chest without contrast shows filling of the right bronchus intermedius with presumed retained mucus or debris, subsequent subtotal opacification of the right middle and lower lobes. There is also filling of the segmental right middle and lower lobe bronchi. Query aspiration. Dependent areas of ill-defined opacity in the left lower lobe, suboptimally assessed due to motion. This may represent aspiration, atelectasis or pneumonia. Appreciate pulmonary input Follow-up results of sputum Gram stain and culture Start patient on Unasyn Continue pulmonary toilet with flutter valve Speech therapy for swallow function evaluation      COPD with acute exacerbation (HCC) Chronic respiratory failure Patient presented with increased cough, shortness of breath and thick sputum that is likely multifactorial in the setting of acute COPD exacerbation as well as bronchiectasis.   Symptoms appear to be triggered by acute viral illness.  Respiratory viral panel is positive for rhinovirus She has been on doxycycline and levofloxacin recently with symptoms quickly recurring. Continue oxygen supplementation at 2 L to maintain pulse oximetry greater than 88% Continue scheduled and as needed DuoNebs every 6 hours Continue Pulmicort nebulizers twice daily as well as systemic steroids Continue pulmonary toilet    Chronic heart failure with preserved ejection fraction (HFpEF) (HCC) Patient's BNP is  elevated at 134, however on examination there is only peripheral edema likely due to known lymphedema.  No other evidence of hypervolemia at this time. Not acutely exacerbated Continue diuretic therapy    Prediabetes Hyperglycemia noted, and in light of steroid therapy she may  ultimately require SSI.   Last A1c of 6.0% 5 months ago Continue sliding scale coverage    Chronic kidney disease, stage 3b (HCC) Per chart review, patient has a history of CKD stage IIIb, with current creatinine of 1.13 which is improved compared to her baseline. Continue to monitor renal indices while admitted    Lymphedema Bilateral lymphedema noted   - SCDs ordered   Trigeminal neuralgia - Continue home gabapentin         Subjective: Patient is seen and examined at the bedside.  Her caregiver is at the bedside  Physical Exam: Vitals:   09/27/23 0500 09/27/23 0515 09/27/23 0735 09/27/23 0928  BP:  (!) 97/39  123/69  Pulse:  (!) 59  75  Resp:  18  18  Temp:  98.1 F (36.7 C)  98.1 F (36.7 C)  TempSrc:  Oral  Oral  SpO2:  91% 93% 93%  Weight: 84.4 kg     Height:       Vitals and nursing note reviewed.  Constitutional:      General: She is not in acute distress.    Appearance: She is obese. She is not toxic-appearing.  HENT:     Head: Normocephalic and atraumatic.     Right Ear: Decreased hearing noted.     Left Ear: Decreased hearing noted.     Mouth/Throat:     Pharynx: Oropharynx is clear.     Comments:  Eyes:     Extraocular Movements: Extraocular movements intact.     Pupils: Pupils are equal, round, and reactive to light.  Neck:     Vascular: No JVD.  Cardiovascular:     Rate and Rhythm: Normal rate and regular rhythm.     Heart sounds: No murmur heard. Pulmonary:     Effort: Pulmonary effort is normal. No accessory muscle usage or respiratory distress.     Breath sounds: Wheezing (Intermittent expiratory wheezing) and rhonchi (Diffuse throughout) present. No decreased breath sounds or rales.  Abdominal:     Palpations: Abdomen is soft.     Tenderness: There is no abdominal tenderness. There is no guarding.  Musculoskeletal:     Comments: +2 pitting edema of the lower extremities  Neurological:     Mental Status: She is alert and oriented to  person, place, and time.  Psychiatric:        Mood and Affect: Mood normal.        Behavior: Behavior normal.        Data Reviewed: Labs reviewed.  White count 20, serum creatinine 1.25 There are no new results to review at this time.  Family Communication: Plan of care discussed with patient's son over the phone.  He verbalizes understanding and agrees with the plan.  Disposition: Status is: Observation The patient remains OBS appropriate and will d/c before 2 midnights.  Planned Discharge Destination: Home with Home Health    Time spent: 35  minutes  Author: Lucile Shutters, MD 09/27/2023 1:57 PM  For on call review [email or web address].

## 2023-09-27 NOTE — Consult Note (Signed)
PULMONOLOGY         Date: 09/27/2023,   MRN# [id number] NOZOMI MICKIEWICZ Nov 24, 1926     AdmissionWeight: 87.7 kg                 CurrentWeight: 84.4 kg  Referring provider: Dr Joylene Igo   CHIEF COMPLAINT:   Acute on chronic hypoxemic respiratory failure   HISTORY OF PRESENT ILLNESS   IRONESHA ROTHE is a 89 y.o. 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  She endorses a cough that is productive of brown sputum.  She denies any other symptoms including chest pain, palpitations. She finished multiple bursts of antibiotics and steroids for LRTI which has only partially improved. She was noted to have leucocytosis and hypoxemia.  PCCM consultation for further evaluation and management.       PAST MEDICAL HISTORY   Past Medical History:  Diagnosis Date   Acoustic neuroma (HCC)    Allergy    Asthma    Bilateral swelling of feet    and legs   Bladder infection    CAD (coronary artery disease)    Cataract    Change in voice    Compression fracture of body of thoracic vertebra (HCC)    T12 09/18/15 MRI s/p fall    Constipation    COPD (chronic obstructive pulmonary disease) (HCC)    previous CXR with chronic interstitial lung dz    CVA (cerebral vascular accident) (HCC)    Depression    Diabetes (HCC)    with neuropathy   Diabetes mellitus, type 2 (HCC)    Diarrhea    Double vision    DVT (deep venous thrombosis) (HCC)    right leg 10/2015 was on coumadin off as of 2017/2018 ; s/p IVC filter   Enuresis    Eye pain, right    Fall    Fatty liver    09/15/15 also mildly dilated pancreatitic duct rec MRCP small sub cm cyst hemangioma speeln mild right hydronephrorossi and prox. hydroureter, kidney stones, mild scarring kidneys   Female stress incontinence    Flank pain     GERD (gastroesophageal reflux disease)    with small hiatal hernia    Hard of hearing    Heart disease    History of kidney problems    Hyperlipidemia    mixed   Hypertension    Hypothyroidism, postsurgical    Impaired mobility and ADLs    uses rolling walker has caretaker 24/7 at home   Leg edema    Mixed incontinence urge and stress (female)(female)    Neuropathy    Osteoarthritis    DDD spine    Osteoporosis with fracture    T12 compression fracture   Photophobia    Pulmonary embolism (HCC)    10/2015 off coumadin as of 04/2016   Pulmonary HTN (HCC)    mild pulm HTN, echo 10/09/15 EF 55-60%grade 1 dd, RV systolic pressure increased    Recurrent UTI    Sinus pressure    Skin cancer    BCC jawline and scalp    Thyroid disease    follows [facility]   TIA (transient ischemic attack)    MRI 2009/2010 neg stroke    Trigeminal neuralgia    Dr. Cammie Mcgee s/p gamma knife x 2,  on Tegretol since 2011/2012 no increase in dose >200 mg bid rec per family per neurology    Urinary frequency    Urinary, incontinence, stress female    Dr Apolinar Junes urology      SURGICAL HISTORY   Past Surgical History:  Procedure Laterality Date   APPENDECTOMY     as a child, open   BRAIN SURGERY     schwnnoma removal 1996    brain tumor surgery     BREAST SURGERY     breast bx   CATARACT EXTRACTION     CHOLECYSTECTOMY     EYE SURGERY     cataract   IVC FILTER PLACEMENT ([facility] HX)     Dr. Wyn Quaker 10/2015    LAPAROSCOPIC TUBAL LIGATION     MOHS SURGERY     scalp 04/2014    PERIPHERAL VASCULAR CATHETERIZATION N/A 10/11/2015   Procedure: IVC Filter Insertion;  Surgeon: Annice Needy, MD;  Location: [facility] INVASIVE CV LAB;  Service: Cardiovascular;  Laterality: N/A;   PERIPHERAL VASCULAR THROMBECTOMY Bilateral 03/29/2018   Procedure: PERIPHERAL VASCULAR THROMBECTOMY;  Surgeon: Annice Needy, MD;  Location: [facility] INVASIVE CV LAB;  Service: Cardiovascular;  Laterality: Bilateral;   PUBOVAGINAL SLING      THROAT SURGERY     THYROID SURGERY     tumor around vocal cords    TOOTH EXTRACTION     winter 2018    TOTAL THYROIDECTOMY  1976     FAMILY HISTORY   Family History  Problem Relation Age of Onset   Heart disease Mother    Diabetes Father    Cancer Daughter        breast ca x 2 s/p mastectomy      SOCIAL HISTORY   Social History   Tobacco Use   Smoking status: Former    Current packs/day: 0.00    Average packs/day: 0.5 packs/day for 20.0 years (10.0 ttl pk-yrs)    Types: Cigarettes    Start date: 09/20/1975    Quit date: 09/20/1995    Years since quitting: 28.0   Smokeless tobacco: Never   Tobacco comments:    quit 1996 smoked 20 years max 8 cig qd   Vaping Use   Vaping status: Never Used  Substance Use Topics   Alcohol use: No   Drug use: No     MEDICATIONS    Home Medication:    Current Medication:  Current Facility-Administered Medications:    acetaminophen (TYLENOL) tablet 650 mg, 650 mg, Oral, Q6H PRN **OR** acetaminophen (TYLENOL) suppository 650 mg, 650 mg, Rectal, Q6H PRN, Verdene Lennert, MD   albuterol (PROVENTIL) (2.5 MG/3ML) 0.083% nebulizer solution 3 mL, 3 mL, Inhalation, Q2H PRN, Minna Antis, MD, 3 mL at 09/26/23 1445   apixaban (ELIQUIS) tablet 2.5 mg, 2.5 mg, Oral, BID, Verdene Lennert, MD, 2.5 mg at 09/27/23 0914   atorvastatin (LIPITOR) tablet 40 mg, 40 mg, Oral, Daily, Verdene Lennert, MD, 40 mg at 09/26/23 2147   azithromycin (ZITHROMAX) 500 mg in sodium chloride 0.9 % 250 mL IVPB, 500 mg, Intravenous, Q24H, Verdene Lennert, MD, Stopped at 09/26/23 2140   bisacodyl (DULCOLAX) EC tablet 5 mg, 5 mg, Oral, Daily PRN, Verdene Lennert, MD   budesonide (PULMICORT) nebulizer solution 0.25 mg, 0.25 mg, Nebulization, BID, Verdene Lennert, MD, 0.25 mg at 09/27/23 0732   ceFEPIme (MAXIPIME) 2 g in sodium chloride 0.9 % 100 mL IVPB, 2 g, Intravenous, Q12H, Rockwell Alexandria, RPH, Last Rate: 200 mL/hr at 09/27/23 0547, 2 g at  09/27/23 0547    feeding supplement (GLUCERNA SHAKE) (GLUCERNA SHAKE) liquid 237 mL, 237 mL, Oral, BID BM, Verdene Lennert, MD, 237 mL at 09/27/23 0912   furosemide (LASIX) tablet 40 mg, 40 mg, Oral, Daily, Verdene Lennert, MD, 40 mg at 09/27/23 0912   gabapentin (NEURONTIN) capsule 200 mg, 200 mg, Oral, TID, Verdene Lennert, MD, 200 mg at 09/27/23 0912   guaiFENesin (MUCINEX) 12 hr tablet 600 mg, 600 mg, Oral, BID, Verdene Lennert, MD, 600 mg at 09/27/23 0912   insulin aspart (novoLOG) injection 0-6 Units, 0-6 Units, Subcutaneous, TID WC, Verdene Lennert, MD, 1 Units at 09/27/23 0911   ipratropium-albuterol (DUONEB) 0.5-2.5 (3) MG/3ML nebulizer solution 3 mL, 3 mL, Nebulization, Q6H, Verdene Lennert, MD, 3 mL at 09/27/23 0732   levothyroxine (SYNTHROID) tablet 150 mcg, 150 mcg, Oral, Q0600, Verdene Lennert, MD, 150 mcg at 09/27/23 0541   mirtazapine (REMERON) tablet 15 mg, 15 mg, Oral, QHS, Verdene Lennert, MD, 15 mg at 09/26/23 2148   ondansetron (ZOFRAN) tablet 4 mg, 4 mg, Oral, Q6H PRN **OR** ondansetron (ZOFRAN) injection 4 mg, 4 mg, Intravenous, Q6H PRN, Verdene Lennert, MD   pantoprazole (PROTONIX) EC tablet 40 mg, 40 mg, Oral, Daily, Verdene Lennert, MD, 40 mg at 09/27/23 0912   polyethylene glycol (MIRALAX / GLYCOLAX) packet 17 g, 17 g, Oral, Daily PRN, Verdene Lennert, MD   [COMPLETED] methylPREDNISolone sodium succinate (SOLU-MEDROL) 125 mg/2 mL injection 125 mg, 125 mg, Intravenous, Once, 125 mg at 09/26/23 2012 **FOLLOWED BY** predniSONE (DELTASONE) tablet 40 mg, 40 mg, Oral, Q breakfast, Verdene Lennert, MD, 40 mg at 09/27/23 8657   sodium chloride flush (NS) 0.9 % injection 3 mL, 3 mL, Intravenous, Q12H, Verdene Lennert, MD, 3 mL at 09/27/23 0912    ALLERGIES   Penicillins, Sulfa antibiotics, Amitiza [lubiprostone], Aspirin, and Penicillin g     REVIEW OF SYSTEMS    Review of Systems:  Gen:  Denies  fever, sweats, chills weigh loss  HEENT: Denies blurred vision, double vision, ear pain,  eye pain, hearing loss, nose bleeds, sore throat Cardiac:  No dizziness, chest pain or heaviness, chest tightness,edema Resp:   reports dyspnea chronically  Gi: Denies swallowing difficulty, stomach pain, nausea or vomiting, diarrhea, constipation, bowel incontinence Gu:  Denies bladder incontinence, burning urine Ext:   Denies Joint pain, stiffness or swelling Skin: Denies  skin rash, easy bruising or bleeding or hives Endoc:  Denies polyuria, polydipsia , polyphagia or weight change Psych:   Denies depression, insomnia or hallucinations   Other:  All other systems negative   VS: BP 123/69 (BP Location: Right Arm)   Pulse 75   Temp 98.1 F (36.7 C) (Oral)   Resp 18   Ht 5\' 7"  (1.702 m)   Wt 84.4 kg   LMP  (LMP Unknown)   SpO2 93%   BMI 29.14 kg/m      PHYSICAL EXAM    GENERAL:NAD, no fevers, chills, no weakness no fatigue HEAD: Normocephalic, atraumatic.  EYES: Pupils equal, round, reactive to light. Extraocular muscles intact. No scleral icterus.  MOUTH: Moist mucosal membrane. Dentition intact. No abscess noted.  EAR, NOSE, THROAT: Clear without exudates. No external lesions.  NECK: Supple. No thyromegaly. No nodules. No JVD.  PULMONARY: decreased breath sounds with mild rhonchi worse at bases bilaterally.  CARDIOVASCULAR: S1 and S2. Regular rate and rhythm. No murmurs, rubs, or gallops. No edema. Pedal pulses 2+ bilaterally.  GASTROINTESTINAL: Soft, nontender, nondistended. No masses. Positive bowel sounds. No hepatosplenomegaly.  MUSCULOSKELETAL: No swelling,  clubbing, or edema. Range of motion full in all extremities.  NEUROLOGIC: Cranial nerves II through XII are intact. No gross focal neurological deficits. Sensation intact. Reflexes intact.  SKIN: No ulceration, lesions, rashes, or cyanosis. Skin warm and dry. Turgor intact.  PSYCHIATRIC: Mood, affect within normal limits. The patient is awake, alert and oriented x 3. Insight, judgment intact.       IMAGING     Narrative & Impression  CLINICAL DATA:  Respiratory illness, nondiagnostic xray History of bronchiectasis   89 year old with shortness of breath, cough and congestion.   EXAM: CT CHEST WITHOUT CONTRAST   TECHNIQUE: Multidetector CT imaging of the chest was performed following the standard protocol without IV contrast.   RADIATION DOSE REDUCTION: This exam was performed according to the departmental dose-optimization program which includes automated exposure control, adjustment of the mA and/or kV according to patient size and/or use of iterative reconstruction technique.   COMPARISON:  Radiograph earlier today.  Chest CT 08/14/2021   FINDINGS: Cardiovascular: The heart is borderline enlarged. There are coronary artery and aortic valvular calcifications. Aortic atherosclerosis and tortuosity. No pericardial effusion.   Mediastinum/Nodes: Breathing motion artifact obscures mediastinal assessment. No obvious bulky mediastinal adenopathy. There are few prominent subcarinal nodes. No esophageal wall thickening.   Lungs/Pleura: Breathing motion artifact obscures detailed parenchymal assessment. There is filling of the right bronchus intermedius with presumed retained mucus or debris, subsequent subtotal opacification of the right middle and lower lobes. There is also filling of the segmental right middle and lower lobe bronchi. Air bronchograms in the right lower lobe. Areas of bronchiectasis on prior are obscured. Dependent areas of ill-defined opacity in the left lower lobe, suboptimally assessed due to motion. Punctate calcified granuloma in the right lower lobe. No significant pleural effusion.   Upper Abdomen: Hypodensities in the spleen are nonspecific. These were not definitively seen on prior. Large volume of stool in the included colon.   Musculoskeletal: There are no acute or suspicious osseous abnormalities. No chest wall soft tissue abnormalities.    IMPRESSION: 1. Filling of the right bronchus intermedius with presumed retained mucus or debris, subsequent subtotal opacification of the right middle and lower lobes. There is also filling of the segmental right middle and lower lobe bronchi. Query aspiration. 2. Dependent areas of ill-defined opacity in the left lower lobe, suboptimally assessed due to motion. This may represent aspiration, atelectasis or pneumonia.   Aortic Atherosclerosis (ICD10-I70.0).     Electronically Signed   By: Narda Rutherford M.D.   On: 09/26/2023 20:33     ASSESSMENT/PLAN   Right middle lobe and lower lobe atelectasis  - currently on broad spectrum abx - noted R hemidiaphragm elevation - Sniff test to evaluate phrenic nerve paralysis  -narrowing abx to augmentin BID for asp pna  Bilateral mucus plugging of bronchi  - mucomyst with albuterol   - flutter valve   - chest VEST Therapy  - PT/OT    Right basilar atelectasis    - Incentive spirometry    - PT/OT         Thank you for allowing me to participate in the care of this patient.   Patient/Family are satisfied with care plan and all questions have been answered.    Provider disclosure: Patient with at least one acute or chronic illness or injury that poses a threat to life or bodily function and is being managed actively during this encounter.  All of the below services have been performed independently by signing provider:  review of prior documentation from internal and or external health records.  Review of previous and current lab results.  Interview and comprehensive assessment during patient visit today. Review of current and previous chest radiographs/CT scans. Discussion of management and test interpretation with health care team and patient/family.   This document was prepared using Dragon voice recognition software and may include unintentional dictation errors.     Vida Rigger, M.D.  Division of Pulmonary & Critical  Care Medicine

## 2023-09-27 NOTE — Progress Notes (Signed)
Transition of Care [facility]) - Progression Note    Patient Details  Name: Jessica Erickson MRN: [id number] Date of Birth: 11/16/1926  Transition of Care [facility] CM/SW Contact  Truddie Hidden, RN Phone Number: 09/27/2023, 1:28 PM  Clinical Narrative:    TOC continuing to follow patient's progress throughout discharge planning.        Expected Discharge Plan and Services                                               Social Determinants of Health (SDOH) Interventions SDOH Screenings   Food Insecurity: No Food Insecurity (09/26/2023)  Housing: Low Risk  (09/26/2023)  Transportation Needs: No Transportation Needs (09/26/2023)  Utilities: Not At Risk (09/26/2023)  Depression (PHQ2-9): Low Risk  (09/11/2023)  Recent Concern: Depression (PHQ2-9) - Medium Risk (08/14/2023)  Financial Resource Strain: Low Risk  (01/18/2023)  Physical Activity: Insufficiently Active (01/18/2023)  Social Connections: Unknown (01/18/2023)  Stress: No Stress Concern Present (01/18/2023)  Tobacco Use: Medium Risk (09/26/2023)    Readmission Risk Interventions    08/15/2021    9:47 AM  Readmission Risk Prevention Plan  Transportation Screening Complete  Medication Review (RN Care Manager) Complete  PCP or Specialist appointment within 3-5 days of discharge Complete  HRI or Home Care Consult Complete  SW Recovery Care/Counseling Consult Complete  Palliative Care Screening Complete  Skilled Nursing Facility Complete

## 2023-09-27 NOTE — Plan of Care (Signed)

## 2023-09-27 NOTE — Evaluation (Signed)
Physical Therapy Evaluation Patient Details Name: Jessica Erickson MRN: [id number] DOB: [birth year] Today's Date: 09/27/2023  History of Present Illness  Pt is a 89 y.o. 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  Clinical Impression  Pt is seen by OT and PT for co-evaluation to address functional mobility. Pt communicates using pen and paper due to hearing deficit. Pt is received in bed with caregiver Scarlette Calico at bedside, she is agreeable to PT/OT session. At baseline, Pt reports amb using rollator for household amb distance and manual wheelchair for community amb distance, having 24/7 assist for IADLs, and use of 2L [place]. Pt performs all mobility mod I SBA for safety but no cuing necessary. During mobility to bathroom, Pt removed O2 support which caregiver says she does at home. BP assessed at end of session with reading of 123/69 (86) and SpO2 ranging 92-96% on 2L [place] with no reports of difficulty or SOB during session. At this time, Pt and caregiver report Pt is at baseline and does not feel she needs PT/OT. Will sign off from acute skilled PT at this time.      If plan is discharge home, recommend the following: Assist for transportation;Help with stairs or ramp for entrance   Can travel by private vehicle        Equipment Recommendations None recommended by PT  Recommendations for Other Services       Functional Status Assessment Patient has not had a recent decline in their functional status     Precautions / Restrictions Precautions Precautions: Fall Restrictions Weight Bearing Restrictions: No      Mobility  Bed Mobility Overal bed mobility: Needs Assistance Bed Mobility: Supine to Sit, Sit to Supine     Supine to sit: Modified independent (Device/Increase time) Sit to supine:  Modified independent (Device/Increase time)   General bed mobility comments: Pt able to perform bed mobility mod I with no cuing necessary from PT/OT    Transfers Overall transfer level: Modified independent Equipment used: Rolling walker (2 wheels)               General transfer comment: Pt able to perform STS from bed and heightened toilet seat mod I with no LOB noted    Ambulation/Gait Ambulation/Gait assistance: Modified independent (Device/Increase time) Gait Distance (Feet): 15 Feet Assistive device: Rolling walker (2 wheels) Gait Pattern/deviations: WFL(Within Functional Limits), Step-through pattern, Decreased stride length, Trunk flexed Gait velocity: slightly decreased     General Gait Details: Pt able to amb approx 15 ft using RW with no cuing for AD management and with safe technique  Stairs            Wheelchair Mobility     Tilt Bed    Modified Rankin (Stroke Patients Only)       Balance Overall balance assessment: Modified Independent                                           Pertinent Vitals/Pain Pain Assessment Pain Assessment: No/denies pain    Home Living Family/patient expects to be discharged to:: Private residence Living Arrangements: Children Available Help at Discharge: Family;Available 24 hours/day;Personal care attendant Type of Home: House Home Access: Stairs to enter;Ramped entrance  Entrance Stairs-Rails: None Entrance Stairs-Number of Steps: 2 in foyer after ramp entry   Home Layout: One level Home Equipment: Agricultural consultant (2 wheels);Wheelchair - manual Additional Comments: 2L [place] at baseline with 24/7 care from family and caregivers    Prior Function Prior Level of Function : Needs assist       Physical Assist : Mobility (physical);ADLs (physical) Mobility (physical): Transfers;Gait;Stairs   Mobility Comments: uses rollator during household amb and wheelchair for long distances ADLs Comments: Pt  reports use indepen ADLs and assist with IADLs     Extremity/Trunk Assessment   Upper Extremity Assessment Upper Extremity Assessment: Defer to OT evaluation;Overall [facility] for tasks assessed    Lower Extremity Assessment Lower Extremity Assessment: Overall WFL for tasks assessed    Cervical / Trunk Assessment Cervical / Trunk Assessment: Kyphotic  Communication   Communication Communication: Other (comment);Hearing impairment (Pt communicates with use of paper & pen, caregiver Scarlette Calico at bedside to assist with subjective information and communication with Pt) Cueing Techniques: Verbal cues  Cognition Arousal: Alert Behavior During Therapy: WFL for tasks assessed/performed Overall Cognitive Status: Within Functional Limits for tasks assessed                                 General Comments: AO x4; Pleasant and cooperative during therapy        General Comments General comments (skin integrity, edema, etc.): Able to maintain seated and standing balance without use of BUE support; steady during standing balance; BP assessed at bed of session with reading of 123/69 (86) and SpO2 92-96% on 2L [place] and brief period of RA    Exercises Other Exercises Other Exercises: Toileting; mod I and close supA for safety   Assessment/Plan    PT Assessment Patient does not need any further PT services  PT Problem List         PT Treatment Interventions      PT Goals (Current goals can be found in the Care Plan section)  Acute Rehab PT Goals Patient Stated Goal: to go home PT Goal Formulation: All assessment and education complete, DC therapy Time For Goal Achievement: 09/27/23 Potential to Achieve Goals: Good    Frequency       Co-evaluation PT/OT/SLP Co-Evaluation/Treatment: Yes Reason for Co-Treatment: Necessary to address cognition/behavior during functional activity;For patient/therapist safety;To address functional/ADL transfers PT goals addressed during session:  Mobility/safety with mobility;Balance;Proper use of DME         AM-PAC PT "6 Clicks" Mobility  Outcome Measure Help needed turning from your back to your side while in a flat bed without using bedrails?: None Help needed moving from lying on your back to sitting on the side of a flat bed without using bedrails?: None Help needed moving to and from a bed to a chair (including a wheelchair)?: None Help needed standing up from a chair using your arms (e.g., wheelchair or bedside chair)?: None Help needed to walk in hospital room?: None Help needed climbing 3-5 steps with a railing? : A Little 6 Click Score: 23    End of Session Equipment Utilized During Treatment: Oxygen Activity Tolerance: Patient tolerated treatment well Patient left: in bed;with call bell/phone within reach;with bed alarm set;with family/visitor present Nurse Communication: Mobility status PT Visit Diagnosis: Other abnormalities of gait and mobility (R26.89)    Time: 1017-1040 PT Time Calculation (min) (ACUTE ONLY): 23 min   Charges:  Elmon Else, SPT   Ygnacio Fecteau 09/27/2023, 10:54 AM

## 2023-09-27 NOTE — Evaluation (Signed)
Occupational Therapy Evaluation Patient Details Name: Jessica Erickson MRN: [id number] DOB: 10-May-1926 Today's Date: 09/27/2023   History of Present Illness Pt is a 89 y.o. 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   Clinical Impression   Pt was seen for OT evaluation this date. Prior to hospital admission, pt was living at home with family/son. She has 24/7 assist at home with paid caregivers. Ambulated with a rollator inside and had a W/C for long distances. PCG provide meals and clean, etc. As well as provide whatever assist the pt needs. Always walk behind the pt during mobility.   Pt presents to acute OT demonstrating no functional decline at this time. She performed bed mobility and STS from EOB with MOD I. Pt ambulated to the bathroom using RW with SBA and toilet transfer at her baseline. Able to manage hygiene at her baseline-SBA. Pt's BP 123/69 and 02 above 90% throughout session. Pt and paid caregiver in the room both reporting pt is performing at her baseline with no acute OT needs at this time and OT to sign off. Do not anticipate the need for follow up OT services upon acute hospital DC.       If plan is discharge home, recommend the following: Assist for transportation;Help with stairs or ramp for entrance;Direct supervision/assist for medications management;Assistance with cooking/housework    Functional Status Assessment  Patient has not had a recent decline in their functional status  Equipment Recommendations  None recommended by OT    Recommendations for Other Services       Precautions / Restrictions Precautions Precautions: Fall Restrictions Weight Bearing Restrictions: No      Mobility Bed Mobility Overal bed mobility: Needs Assistance Bed Mobility: Supine to Sit,  Sit to Supine     Supine to sit: Modified independent (Device/Increase time) Sit to supine: Modified independent (Device/Increase time)   General bed mobility comments: MOD I with no cueing needed    Transfers Overall transfer level: Modified independent Equipment used: Rolling walker (2 wheels)               General transfer comment: MOD for STS from EOB and toilet in bathroom with no LOB      Balance Overall balance assessment: Modified Independent                                         ADL either performed or assessed with clinical judgement   ADL Overall ADL's : At baseline                                       General ADL Comments: Pt ambulated to the bathroom using RW on 2L 02 and demo toilet transfer with SUP/MOD I, PCA reporst providing cueing for wiping front to back to prevent infection, but sometimes they step in to assist, which is her baseline at home with paid caregivers per report     Vision         Perception         Praxis         Pertinent Vitals/Pain Pain Assessment Pain Assessment: No/denies pain  Extremity/Trunk Assessment Upper Extremity Assessment Upper Extremity Assessment: Overall WFL for tasks assessed   Lower Extremity Assessment Lower Extremity Assessment: Overall WFL for tasks assessed   Cervical / Trunk Assessment Cervical / Trunk Assessment: Kyphotic   Communication Communication Communication: Other (comment);Hearing impairment (paper and pen used for communication and paid caregiver in room to assist with history) Cueing Techniques: Verbal cues   Cognition Arousal: Alert Behavior During Therapy: WFL for tasks assessed/performed Overall Cognitive Status: Within Functional Limits for tasks assessed                                 General Comments: AO x4; Pleasant and cooperative during therapy     General Comments  good seated and standing balance without UE  support, pt steady in standing, BP WFL and 02 remained >90 throughout session with brief period without 02 on    Exercises Other Exercises Other Exercises: edu on role of OT and purpose of therapy with pt and PCA reporting pt is at her baseline   Shoulder Instructions      Home Living Family/patient expects to be discharged to:: Private residence Living Arrangements: Children Available Help at Discharge: Family;Available 24 hours/day;Personal care attendant Type of Home: House Home Access: Stairs to enter;Ramped entrance Entrance Stairs-Number of Steps: 2 in foyer after ramp entry Entrance Stairs-Rails: None Home Layout: One level     Bathroom Shower/Tub: Tub/shower unit;Other (comment) (sponge bathes occasionally)   Bathroom Toilet: Handicapped height Bathroom Accessibility: Yes   Home Equipment: Agricultural consultant (2 wheels);Wheelchair - manual   Additional Comments: 2L [place] at baseline with 24/7 care from family and caregivers      Prior Functioning/Environment Prior Level of Function : Needs assist       Physical Assist : Mobility (physical);ADLs (physical) Mobility (physical): Transfers;Gait;Stairs ADLs (physical): Feeding;Grooming;Bathing;Dressing;Toileting;IADLs Mobility Comments: uses rollator during household amb and wheelchair for long distances ADLs Comments: Pt reports use indepen ADLs and assist with IADLs        OT Problem List: Decreased activity tolerance      OT Treatment/Interventions:      OT Goals(Current goals can be found in the care plan section)    OT Frequency:      Co-evaluation PT/OT/SLP Co-Evaluation/Treatment: Yes Reason for Co-Treatment: Necessary to address cognition/behavior during functional activity;For patient/therapist safety;To address functional/ADL transfers PT goals addressed during session: Mobility/safety with mobility;Balance;Proper use of DME OT goals addressed during session: ADL's and self-care      AM-PAC OT "6  Clicks" Daily Activity     Outcome Measure Help from another person eating meals?: None Help from another person taking care of personal grooming?: None Help from another person toileting, which includes using toliet, bedpan, or urinal?: A Little Help from another person bathing (including washing, rinsing, drying)?: A Little Help from another person to put on and taking off regular upper body clothing?: None Help from another person to put on and taking off regular lower body clothing?: A Little 6 Click Score: 21   End of Session Equipment Utilized During Treatment: Rolling walker (2 wheels);Oxygen Nurse Communication: Mobility status  Activity Tolerance: Patient tolerated treatment well Patient left: in bed;with call bell/phone within reach;with bed alarm set;with family/visitor present  OT Visit Diagnosis: History of falling (Z91.81)                Time: 1017-1040 OT Time Calculation (min): 23 min Charges:  OT General Charges $OT Visit:  1 Visit OT Evaluation $OT Eval Low Complexity: 1 Low OT Treatments $Self Care/Home Management : 8-22 mins Sennie Borden, OTR/L  09/27/23, 12:32 PM Adaleena Mooers E Carrol Hougland 09/27/2023, 12:28 PM

## 2023-09-28 ENCOUNTER — Observation Stay: Payer: Medicare PPO

## 2023-09-28 DIAGNOSIS — Z515 Encounter for palliative care: Secondary | ICD-10-CM

## 2023-09-28 DIAGNOSIS — R918 Other nonspecific abnormal finding of lung field: Secondary | ICD-10-CM | POA: Diagnosis not present

## 2023-09-28 DIAGNOSIS — J90 Pleural effusion, not elsewhere classified: Secondary | ICD-10-CM | POA: Diagnosis not present

## 2023-09-28 DIAGNOSIS — J471 Bronchiectasis with (acute) exacerbation: Secondary | ICD-10-CM | POA: Diagnosis not present

## 2023-09-28 DIAGNOSIS — R0600 Dyspnea, unspecified: Secondary | ICD-10-CM

## 2023-09-28 DIAGNOSIS — I5032 Chronic diastolic (congestive) heart failure: Secondary | ICD-10-CM | POA: Diagnosis not present

## 2023-09-28 DIAGNOSIS — R9389 Abnormal findings on diagnostic imaging of other specified body structures: Secondary | ICD-10-CM | POA: Diagnosis not present

## 2023-09-28 LAB — GLUCOSE, CAPILLARY
Glucose-Capillary: 120 mg/dL — ABNORMAL HIGH (ref 70–99)
Glucose-Capillary: 112 mg/dL — ABNORMAL HIGH (ref 70–99)
Glucose-Capillary: 261 mg/dL — ABNORMAL HIGH (ref 70–99)
Glucose-Capillary: 191 mg/dL — ABNORMAL HIGH (ref 70–99)

## 2023-09-28 LAB — C-REACTIVE PROTEIN: CRP: 5.6 mg/dL — ABNORMAL HIGH (ref <1.0)

## 2023-09-28 LAB — BLOOD GAS, VENOUS
Acid-Base Excess: 7.3 mmol/L — ABNORMAL HIGH (ref 0.0–2.0)
Bicarbonate: 34.3 mmol/L — ABNORMAL HIGH (ref 20.0–28.0)
O2 Saturation: 28.4 %
Patient temperature: 37
pCO2, Ven: 58 mm[Hg] (ref 44–60)
pH, Ven: 7.38 (ref 7.25–7.43)

## 2023-09-28 MED ORDER — IPRATROPIUM-ALBUTEROL 0.5-2.5 (3) MG/3ML IN SOLN
3.0000 mL | Freq: Three times a day (TID) | RESPIRATORY_TRACT | Status: DC
  Administered 2023-09-29 – 2023-09-30 (×4): 3 mL via RESPIRATORY_TRACT
  Filled 2023-09-28 (×4): qty 3

## 2023-09-28 NOTE — Progress Notes (Signed)
Mobility Specialist - Progress Note  Post-mobility: SPO2(90)     09/28/23 1000  Mobility  Activity Ambulated with assistance to bathroom;Stood at bedside  Level of Assistance Standby assist, set-up cues, supervision of patient - no hands on  Assistive Device Front wheel walker  Distance Ambulated (ft) 10 ft  Range of Motion/Exercises Active  Activity Response Tolerated well  Mobility Referral Yes  $Mobility charge 1 Mobility  Mobility Specialist Start Time (ACUTE ONLY) 1004  Mobility Specialist Stop Time (ACUTE ONLY) 1026  Mobility Specialist Time Calculation (min) (ACUTE ONLY) 22 min   Pt resting in bed on RA upon entry 2L donned. Pt STS and ambulates to bathroom SBA with RW. Pt returned to bed on 2L and left in bed with needs in reach. Bed alarm activated.   Johnathan Hausen Mobility Specialist 09/28/23, 10:31 AM

## 2023-09-28 NOTE — Progress Notes (Incomplete)
PULMONOLOGY         Date: 09/28/2023,   MRN# [id number] Jessica Erickson 1984/07/20     AdmissionWeight: 87.7 kg                 CurrentWeight: 84.7 kg  Referring provider: Dr Joylene Igo   CHIEF COMPLAINT:   Acute on chronic hypoxemic respiratory failure   HISTORY OF PRESENT ILLNESS   Jessica Erickson is a 89 y.o. 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  She endorses a cough that is productive of brown sputum.  She denies any other symptoms including chest pain, palpitations. She finished multiple bursts of antibiotics and steroids for LRTI which has only partially improved. She was noted to have leucocytosis and hypoxemia.  PCCM consultation for further evaluation and management.     09/28/23- patient in bed resting on 2-3L/min.  Plan for Sniff test and repeat CXR.   Crp is mildly elevated , RVP is in process  PAST MEDICAL HISTORY   Past Medical History:  Diagnosis Date   Acoustic neuroma (HCC)    Allergy    Asthma    Bilateral swelling of feet    and legs   Bladder infection    CAD (coronary artery disease)    Cataract    Change in voice    Compression fracture of body of thoracic vertebra (HCC)    T12 09/18/15 MRI s/p fall    Constipation    COPD (chronic obstructive pulmonary disease) (HCC)    previous CXR with chronic interstitial lung dz    CVA (cerebral vascular accident) (HCC)    Depression    Diabetes (HCC)    with neuropathy   Diabetes mellitus, type 2 (HCC)    Diarrhea    Double vision    DVT (deep venous thrombosis) (HCC)    right leg 10/2015 was on coumadin off as of 2017/2018 ; s/p IVC filter   Enuresis    Eye pain, right    Fall    Fatty liver    09/15/15 also mildly dilated pancreatitic duct rec MRCP small sub cm cyst hemangioma speeln mild  right hydronephrorossi and prox. hydroureter, kidney stones, mild scarring kidneys   Female stress incontinence    Flank pain    GERD (gastroesophageal reflux disease)    with small hiatal hernia    Hard of hearing    Heart disease    History of kidney problems    Hyperlipidemia    mixed   Hypertension    Hypothyroidism, postsurgical    Impaired mobility and ADLs    uses rolling walker has caretaker 24/7 at home   Leg edema    Mixed incontinence urge and stress (female)(female)    Neuropathy    Osteoarthritis    DDD spine    Osteoporosis with fracture    T12 compression fracture   Photophobia    Pulmonary embolism (HCC)    10/2015 off coumadin as of 04/2016   Pulmonary HTN (HCC)    mild pulm HTN, echo 10/09/15 EF 55-60%grade 1 dd, RV systolic pressure increased    Recurrent UTI    Sinus pressure    Skin cancer    BCC jawline and scalp    Thyroid disease    follows [facility]   TIA (transient  ischemic attack)    MRI 2009/2010 neg stroke    Trigeminal neuralgia    Dr. Cammie Mcgee s/p gamma knife x 2, on Tegretol since 2011/2012 no increase in dose >200 mg bid rec per family per neurology    Urinary frequency    Urinary, incontinence, stress female    Dr Apolinar Junes urology      SURGICAL HISTORY   Past Surgical History:  Procedure Laterality Date   APPENDECTOMY     as a child, open   BRAIN SURGERY     schwnnoma removal 1996    brain tumor surgery     BREAST SURGERY     breast bx   CATARACT EXTRACTION     CHOLECYSTECTOMY     EYE SURGERY     cataract   IVC FILTER PLACEMENT ([facility] HX)     Dr. Wyn Quaker 10/2015    LAPAROSCOPIC TUBAL LIGATION     MOHS SURGERY     scalp 04/2014    PERIPHERAL VASCULAR CATHETERIZATION N/A 10/11/2015   Procedure: IVC Filter Insertion;  Surgeon: Annice Needy, MD;  Location: [facility];  Service: Cardiovascular;  Laterality: N/A;   PERIPHERAL VASCULAR THROMBECTOMY Bilateral 03/29/2018   Procedure: PERIPHERAL VASCULAR THROMBECTOMY;   Surgeon: Annice Needy, MD;  Location: [facility];  Service: Cardiovascular;  Laterality: Bilateral;   PUBOVAGINAL SLING     THROAT SURGERY     THYROID SURGERY     tumor around vocal cords    TOOTH EXTRACTION     winter 2018    TOTAL THYROIDECTOMY  1976     FAMILY HISTORY   Family History  Problem Relation Age of Onset   Heart disease Mother    Diabetes Father    Cancer Daughter        breast ca x 2 s/p mastectomy      SOCIAL HISTORY   Social History   Tobacco Use   Smoking status: Former    Current packs/day: 0.00    Average packs/day: 0.5 packs/day for 20.0 years (10.0 ttl pk-yrs)    Types: Cigarettes    Start date: 09/20/1975    Quit date: 09/20/1995    Years since quitting: 28.0   Smokeless tobacco: Never   Tobacco comments:    quit 1996 smoked 20 years max 8 cig qd   Vaping Use   Vaping status: Never Used  Substance Use Topics   Alcohol use: No   Drug use: No     MEDICATIONS    Home Medication:    Current Medication:  Current Facility-Administered Medications:    acetaminophen (TYLENOL) tablet 650 mg, 650 mg, Oral, Q6H PRN **OR** acetaminophen (TYLENOL) suppository 650 mg, 650 mg, Rectal, Q6H PRN, Verdene Lennert, MD   acetylcysteine (MUCOMYST) 20 % nebulizer / oral solution 3 mL, 3 mL, Nebulization, BID, Bobbyjo Marulanda, MD, 3 mL at 09/28/23 0747   albuterol (PROVENTIL) (2.5 MG/3ML) 0.083% nebulizer solution 3 mL, 3 mL, Inhalation, Q2H PRN, Minna Antis, MD, 3 mL at 09/26/23 1445   apixaban (ELIQUIS) tablet 2.5 mg, 2.5 mg, Oral, BID, Verdene Lennert, MD, 2.5 mg at 09/27/23 2242   atorvastatin (LIPITOR) tablet 40 mg, 40 mg, Oral, Daily, Verdene Lennert, MD, 40 mg at 09/27/23 2242   bisacodyl (DULCOLAX) EC tablet 5 mg, 5 mg, Oral, Daily PRN, Verdene Lennert, MD   budesonide (PULMICORT) nebulizer solution 0.25 mg, 0.25 mg, Nebulization, BID, Verdene Lennert, MD, 0.25 mg at 09/28/23 0747   cefTRIAXone (ROCEPHIN) 1 g in  sodium chloride 0.9 %  100 mL IVPB, 1 g, Intravenous, Q24H, Agbata, Tochukwu, MD, Last Rate: 200 mL/hr at 09/27/23 1742, 1 g at 09/27/23 1742   feeding supplement (GLUCERNA SHAKE) (GLUCERNA SHAKE) liquid 237 mL, 237 mL, Oral, BID BM, Verdene Lennert, MD, 237 mL at 09/27/23 1421   gabapentin (NEURONTIN) capsule 200 mg, 200 mg, Oral, TID, Verdene Lennert, MD, 200 mg at 09/27/23 2242   guaiFENesin (MUCINEX) 12 hr tablet 600 mg, 600 mg, Oral, BID, Verdene Lennert, MD, 600 mg at 09/27/23 2242   insulin aspart (novoLOG) injection 0-6 Units, 0-6 Units, Subcutaneous, TID WC, Verdene Lennert, MD, 1 Units at 09/27/23 1645   ipratropium-albuterol (DUONEB) 0.5-2.5 (3) MG/3ML nebulizer solution 3 mL, 3 mL, Nebulization, Q6H, Verdene Lennert, MD, 3 mL at 09/28/23 0747   levothyroxine (SYNTHROID) tablet 150 mcg, 150 mcg, Oral, Q0600, Verdene Lennert, MD, 150 mcg at 09/28/23 8413   metroNIDAZOLE (FLAGYL) IVPB 500 mg, 500 mg, Intravenous, Q12H, Agbata, Tochukwu, MD, Last Rate: 100 mL/hr at 09/28/23 0638, 500 mg at 09/28/23 2440   mirtazapine (REMERON) tablet 15 mg, 15 mg, Oral, QHS, Verdene Lennert, MD, 15 mg at 09/27/23 2242   ondansetron (ZOFRAN) tablet 4 mg, 4 mg, Oral, Q6H PRN **OR** ondansetron (ZOFRAN) injection 4 mg, 4 mg, Intravenous, Q6H PRN, Verdene Lennert, MD   pantoprazole (PROTONIX) EC tablet 40 mg, 40 mg, Oral, Daily, Verdene Lennert, MD, 40 mg at 09/27/23 0912   polyethylene glycol (MIRALAX / GLYCOLAX) packet 17 g, 17 g, Oral, Daily PRN, Verdene Lennert, MD   [COMPLETED] methylPREDNISolone sodium succinate (SOLU-MEDROL) 125 mg/2 mL injection 125 mg, 125 mg, Intravenous, Once, 125 mg at 09/26/23 2012 **FOLLOWED BY** predniSONE (DELTASONE) tablet 40 mg, 40 mg, Oral, Q breakfast, Verdene Lennert, MD, 40 mg at 09/27/23 1027   sodium chloride flush (NS) 0.9 % injection 3 mL, 3 mL, Intravenous, Q12H, Verdene Lennert, MD, 3 mL at 09/27/23 2243    ALLERGIES   Penicillins, Sulfa antibiotics, Amitiza [lubiprostone], Aspirin, and  Penicillin g     REVIEW OF SYSTEMS    Review of Systems:  Gen:  Denies  fever, sweats, chills weigh loss  HEENT: Denies blurred vision, double vision, ear pain, eye pain, hearing loss, nose bleeds, sore throat Cardiac:  No dizziness, chest pain or heaviness, chest tightness,edema Resp:   reports dyspnea chronically  Gi: Denies swallowing difficulty, stomach pain, nausea or vomiting, diarrhea, constipation, bowel incontinence Gu:  Denies bladder incontinence, burning urine Ext:   Denies Joint pain, stiffness or swelling Skin: Denies  skin rash, easy bruising or bleeding or hives Endoc:  Denies polyuria, polydipsia , polyphagia or weight change Psych:   Denies depression, insomnia or hallucinations   Other:  All other systems negative   VS: BP (!) 125/42   Pulse (!) 58   Temp 97.9 F (36.6 C) (Oral)   Resp 18   Ht 5\' 7"  (1.702 m)   Wt 84.7 kg   LMP  (LMP Unknown)   SpO2 97%   BMI 29.25 kg/m      PHYSICAL EXAM    GENERAL:NAD, no fevers, chills, no weakness no fatigue HEAD: Normocephalic, atraumatic.  EYES: Pupils equal, round, reactive to light. Extraocular muscles intact. No scleral icterus.  MOUTH: Moist mucosal membrane. Dentition intact. No abscess noted.  EAR, NOSE, THROAT: Clear without exudates. No external lesions.  NECK: Supple. No thyromegaly. No nodules. No JVD.  PULMONARY: decreased breath sounds with mild rhonchi worse at bases bilaterally.  CARDIOVASCULAR: S1 and S2. Regular rate  and rhythm. No murmurs, rubs, or gallops. No edema. Pedal pulses 2+ bilaterally.  GASTROINTESTINAL: Soft, nontender, nondistended. No masses. Positive bowel sounds. No hepatosplenomegaly.  MUSCULOSKELETAL: No swelling, clubbing, or edema. Range of motion full in all extremities.  NEUROLOGIC: Cranial nerves II through XII are intact. No gross focal neurological deficits. Sensation intact. Reflexes intact.  SKIN: No ulceration, lesions, rashes, or cyanosis. Skin warm and dry.  Turgor intact.  PSYCHIATRIC: Mood, affect within normal limits. The patient is awake, alert and oriented x 3. Insight, judgment intact.       IMAGING    Narrative & Impression  CLINICAL DATA:  Respiratory illness, nondiagnostic xray History of bronchiectasis   89 year old with shortness of breath, cough and congestion.   EXAM: CT CHEST WITHOUT CONTRAST   TECHNIQUE: Multidetector CT imaging of the chest was performed following the standard protocol without IV contrast.   RADIATION DOSE REDUCTION: This exam was performed according to the departmental dose-optimization program which includes automated exposure control, adjustment of the mA and/or kV according to patient size and/or use of iterative reconstruction technique.   COMPARISON:  Radiograph earlier today.  Chest CT 08/14/2021   FINDINGS: Cardiovascular: The heart is borderline enlarged. There are coronary artery and aortic valvular calcifications. Aortic atherosclerosis and tortuosity. No pericardial effusion.   Mediastinum/Nodes: Breathing motion artifact obscures mediastinal assessment. No obvious bulky mediastinal adenopathy. There are few prominent subcarinal nodes. No esophageal wall thickening.   Lungs/Pleura: Breathing motion artifact obscures detailed parenchymal assessment. There is filling of the right bronchus intermedius with presumed retained mucus or debris, subsequent subtotal opacification of the right middle and lower lobes. There is also filling of the segmental right middle and lower lobe bronchi. Air bronchograms in the right lower lobe. Areas of bronchiectasis on prior are obscured. Dependent areas of ill-defined opacity in the left lower lobe, suboptimally assessed due to motion. Punctate calcified granuloma in the right lower lobe. No significant pleural effusion.   Upper Abdomen: Hypodensities in the spleen are nonspecific. These were not definitively seen on prior. Large volume of stool  in the included colon.   Musculoskeletal: There are no acute or suspicious osseous abnormalities. No chest wall soft tissue abnormalities.   IMPRESSION: 1. Filling of the right bronchus intermedius with presumed retained mucus or debris, subsequent subtotal opacification of the right middle and lower lobes. There is also filling of the segmental right middle and lower lobe bronchi. Query aspiration. 2. Dependent areas of ill-defined opacity in the left lower lobe, suboptimally assessed due to motion. This may represent aspiration, atelectasis or pneumonia.   Aortic Atherosclerosis (ICD10-I70.0).     Electronically Signed   By: Narda Rutherford M.D.   On: 09/26/2023 20:33     ASSESSMENT/PLAN   Right middle lobe and lower lobe atelectasis  - currently on broad spectrum abx - noted R hemidiaphragm elevation - Sniff test to evaluate phrenic nerve paralysis  -patient has PCN allergy is on flagyl  - patient is cleared for dc home, she has rhinovirus viral LRTI - her family has agreed to care for her at home and do not wish for her to have in any facility   Bilateral mucus plugging of bronchi- resolved   - mucomyst with albuterol   - flutter valve   - chest VEST Therapy  - PT/OT    Right basilar atelectasis- improved     - Incentive spirometry    - PT/OT       Thank you for allowing me to participate  in the care of this patient.   Patient/Family are satisfied with care plan and all questions have been answered.    Provider disclosure: Patient with at least one acute or chronic illness or injury that poses a threat to life or bodily function and is being managed actively during this encounter.  All of the below services have been performed independently by signing provider:  review of prior documentation from internal and or external health records.  Review of previous and current lab results.  Interview and comprehensive assessment during patient visit today. Review of  current and previous chest radiographs/CT scans. Discussion of management and test interpretation with health care team and patient/family.   This document was prepared using Dragon voice recognition software and may include unintentional dictation errors.     Vida Rigger, M.D.  Division of Pulmonary & Critical Care Medicine

## 2023-09-28 NOTE — Evaluation (Signed)
Clinical/Bedside Swallow Evaluation Patient Details  Name: Jessica Erickson MRN: [id number] Date of Birth: 21-Apr-1926  Today's Date: 09/28/2023 Time: SLP Start Time (ACUTE ONLY): 1045 SLP Stop Time (ACUTE ONLY): 1155 SLP Time Calculation (min) (ACUTE ONLY): 70 min  Past Medical History:  Past Medical History:  Diagnosis Date   Acoustic neuroma (HCC)    Allergy    Asthma    Bilateral swelling of feet    and legs   Bladder infection    CAD (coronary artery disease)    Cataract    Change in voice    Compression fracture of body of thoracic vertebra (HCC)    T12 09/18/15 MRI s/p fall    Constipation    COPD (chronic obstructive pulmonary disease) (HCC)    previous CXR with chronic interstitial lung dz    CVA (cerebral vascular accident) (HCC)    Depression    Diabetes (HCC)    with neuropathy   Diabetes mellitus, type 2 (HCC)    Diarrhea    Double vision    DVT (deep venous thrombosis) (HCC)    right leg 10/2015 was on coumadin off as of 2017/2018 ; s/p IVC filter   Enuresis    Eye pain, right    Fall    Fatty liver    09/15/15 also mildly dilated pancreatitic duct rec MRCP small sub cm cyst hemangioma speeln mild right hydronephrorossi and prox. hydroureter, kidney stones, mild scarring kidneys   Female stress incontinence    Flank pain    GERD (gastroesophageal reflux disease)    with small hiatal hernia    Hard of hearing    Heart disease    History of kidney problems    Hyperlipidemia    mixed   Hypertension    Hypothyroidism, postsurgical    Impaired mobility and ADLs    uses rolling walker has caretaker 24/7 at home   Leg edema    Mixed incontinence urge and stress (female)(female)    Neuropathy    Osteoarthritis    DDD spine    Osteoporosis with fracture    T12 compression fracture   Photophobia    Pulmonary embolism (HCC)    10/2015 off coumadin as of 04/2016   Pulmonary HTN (HCC)    mild pulm HTN, echo 10/09/15 EF 55-60%grade 1 dd, RV systolic pressure  increased    Recurrent UTI    Sinus pressure    Skin cancer    BCC jawline and scalp    Thyroid disease    follows [facility]   TIA (transient ischemic attack)    MRI 2009/2010 neg stroke    Trigeminal neuralgia    Dr. Cammie Mcgee s/p gamma knife x 2, on Tegretol since 2011/2012 no increase in dose >200 mg bid rec per family per neurology    Urinary frequency    Urinary, incontinence, stress female    Dr Apolinar Junes urology    Past Surgical History:  Past Surgical History:  Procedure Laterality Date   APPENDECTOMY     as a child, open   BRAIN SURGERY     schwnnoma removal 1996    brain tumor surgery     BREAST SURGERY     breast bx   CATARACT EXTRACTION     CHOLECYSTECTOMY     EYE SURGERY     cataract   IVC FILTER PLACEMENT ([facility] HX)     Dr. Wyn Quaker 10/2015    LAPAROSCOPIC TUBAL LIGATION     MOHS SURGERY  scalp 04/2014    PERIPHERAL VASCULAR CATHETERIZATION N/A 10/11/2015   Procedure: IVC Filter Insertion;  Surgeon: Annice Needy, MD;  Location: [facility] INVASIVE CV LAB;  Service: Cardiovascular;  Laterality: N/A;   PERIPHERAL VASCULAR THROMBECTOMY Bilateral 03/29/2018   Procedure: PERIPHERAL VASCULAR THROMBECTOMY;  Surgeon: Annice Needy, MD;  Location: [facility] INVASIVE CV LAB;  Service: Cardiovascular;  Laterality: Bilateral;   PUBOVAGINAL SLING     THROAT SURGERY     THYROID SURGERY     tumor around vocal cords    TOOTH EXTRACTION     winter 2018    TOTAL THYROIDECTOMY  1976   HPI:  Pt is a 89 y.o. female with medical history significant of chronic hypoxic respiratory failure on 2 L at baseline, COPD, UTI, chronic bronchiectasis, CAD, CVA, type 2 diabetes, Deafness due to acoustic neuroma who presents to the ED due to shortness of breath.  Patient's son states that she has a baseline cough but it has increased although she has been on numerous rounds of antibiotics (both for COPD exacerbation and UTI).  They were hoping to wait for her PCP appointment tomorrow, but earlier today  Jessica Erickson felt very weak and asked to come to the ED, which is unusual for her.  He denies noting any fevers at home.  She was on a round of antibiotics for UTI as well as prednisone last doses were over 10 days ago.  OF NOTE: in 08/2021, pt had a MBSS which revealed: "Pt appears to present w/ Chronic Mild-Mod oropharyngeal phase dysphagia w/ increased risk for aspiration thus Pulmonary impact. When using conservative swallowing strategies during po trials of thin liquids (VIA CUP) as well as food consistencies, she lessens risk for aspiration. Swallowing strategies include: SMALL, SINGLE, sips of thin liquids Via Cup SLOWLY; strong Throat Clear/Cough after swallow of thin liquids. Pt would benefit from using a Dysphagia Drink Cup such as the Rije or Provale cups -- this was discussed w/ pt's Son after the MBSS.".  Son reported that pt was still drinking w/ a straw, not using small, single sips, and was not using a Dysphagia drink Cup at home.   Chest CT this admit: "filling of the right bronchus intermedius with presumed retained  mucus or debris, subsequent subtotal opacification of the right  middle and lower lobes. There is also filling of the segmental right  middle and lower lobe bronchi. Query aspiration.  2. Dependent areas of ill-defined opacity in the left lower lobe,  suboptimally assessed due to motion.".    Assessment / Plan / Recommendation  Clinical Impression   Pt seen for BSE this morning. Son present. Pt awake, Deaf at baseline now. Son writes and gestures to pt for communication. Gestures and written instruction given to initiate po trials at this BSE; pt nodded in agreement stating she wanted "some water please". Pt is able to follow general instructions appropriately; sat EOB for po trials. Noted she walked to the bathroom w/ Son supporting; edematous LEs bilateral.  Pt is on [place] O2; afebrile.    Discussed w/, and explained to, pt and Son the results of the MBSS in 08/2021 which indicated  pharyngeal phase Dysphagia then, and the importance of the recommended aspiration precautions and swallowing strategies from that MBSS. Son endorsed that pt was still using straws and not following aspiration precautions that had originated from the [facility] in 2022. (Noted similar per tx notes following the MBSS per chart/admit) MBSS 08/2021 revealed:  Pt appears to present  w/ Mild-Mod oropharyngeal phase dysphagia w/ increased risk for aspiration thus Pulmonary impact. When using conservative swallowing strategies during po trials of thin liquids (VIA CUP) as well as food consistencies, she lessens risk for aspiration. Swallowing strategies include: SMALL, SINGLE, sips of thin liquids Via Cup SLOWLY; strong Throat Clear/Cough after swallow of thin liquids. Pt would benefit from using a Dysphagia Drink Cup such as the Rije or Provale cups -- this was discussed w/ pt's Son after the MBSS.      During the oral phase, min+ decreased bolus control moreso w/ thin liquids w/ quick A-P transfer and spillage of thin liquid boluses immediately into the pharynx.  During the pharyngeal phase, delayed pharyngeal swallow initiation moreso w/ thin liquid trials resulting into the thin liquid boluses spilling/dropping into the laryngeal vestibule(deep, contacting the vocal cords) and the pharynx PRIOR to initiation of pharyngeal swallow initiation. W/ completion of the swallow, the majority of the thin liquid consistency that had Penetrated into the laryngeal vestibule appeared to clear the laryngeal vestibule. Pt was instructed to use a conservative strategy of Throat Clear/Cough in order to more aggressively clear the laryngeal vestibule of bolus residue. Aspiration of thin liquid was noted x1 during this study w/ pt responding w/ a mild Cough.  More timely pharyngeal swallow initation noted w/ Nectar liquids and food consistencies.  Of Note, when pt utilized SMALL, SINGLE sips SLOWLY, she demonstrated improved bolus control of  thin liquids through the oropharynx w/ Less laryngeal Penetration into the laryngeal vestibule.  Discussed the aspiration precautions/strategies stemming from the MBSS to be used during this BSE and moving forward d/t the pt's increased risk for aspiration/aspiration pneumonia. (Handouts given and posted in room) Pt shown and nodded in agreement to need for following them, especially sitting upright for all oral intake and using the strong throat clear/cough after drinking liquids using Small, Single sips. Pt was supported to sit EOB. She then fed herself trials of thin liquids via Cup, purees(declined solids). No consistent, overt clinical s/s of aspiration were noted during intake; a mild throat clear x1 b/t trials (of a larger sip). No coughing as had been noted in the past. Pt was heavily educated on following precautions: SMALL, SINGLE, sips SLOWLY and using a Throat clear/Cough after the po's. Pt practiced this and followed through w/ the strategies w/ cues. Unsure if pt would remember to follow through w/ strategies if no one is present to give cues. Respiratory status did not decline further from its Baseline during/after the po's. Oral phase appeared [facility] for bolus management and timely A-P transfer for swallowing; oral clearing achieved. OM Exam revealed no unilateral oral weakness; speech clear. Pt fed self w/ setup.      Pt appears at risk for aspiration thus Pulmonary decline d/t Chronic pharyngeal phase Dysphagia and inconsistent follow through w/ aspiration precautions and strategies as recommended per MBSS in 2022.   Recommend continue a Regular/mech soft diet for Choice and ease of soft/cut foods w/ gravies added to moisten foods; Thin liquids VIA CUP. Recommend strict aspiration precautions and compensatory strategies w/ all oral intake as discussed above and given in Handouts for Son/room/chart; Pills Whole in Puree; tray setup and positioning assistance for meals. Recommend oral care PRIOR  TO po's/meals to reduce oral bacteria burden. NSG Supervision w/ meals.   Strongly recommend f/u w/ Palliative Care and MD re: pt's overall GOC w/ discussion of thickened liquids included. Recommend ST services f/u w/ pt and Son next 1-2 days for  ongoing education and to determine if any further needs. Son presented w/ POC and agreed. Pt agreed.  SLP Visit Diagnosis: Dysphagia, pharyngeal phase (R13.13) (Chronic)    Aspiration Risk  Mild aspiration risk;Risk for inadequate nutrition/hydration (chronic)    Diet Recommendation   Thin;Dysphagia 3 (mechanical soft) (moistened foods for ease of chewing) = a Regular/mech soft diet for Choice and ease of soft/cut foods w/ gravies added to moisten foods; Thin liquids VIA CUP. Recommend strict aspiration precautions and compensatory strategies w/ all oral intake as discussed above and given in Handouts for Son/room/chart; tray setup and positioning assistance for meals. Recommend oral care PRIOR TO po's/meals to reduce oral bacteria burden. NSG Supervision w/ meals. NO STRAWS.  Medication Administration: Whole meds with puree (always)    Other  Recommendations Recommended Consults:  (Palliative Care consult for GOC in setting of Chronic comorbidities and dysphagia at 89yo) Oral Care Recommendations: Oral care BID;Oral care before and after PO;Staff/trained caregiver to provide oral care (denture care)    Recommendations for follow up therapy are one component of a multi-disciplinary discharge planning process, led by the attending physician.  Recommendations may be updated based on patient status, additional functional criteria and insurance authorization.  Follow up Recommendations Follow physician's recommendations for discharge plan and follow up therapies      Assistance Recommended at Discharge  FULL  Functional Status Assessment Patient has had a recent decline in their functional status and/or demonstrates limited ability to make significant  improvements in function in a reasonable and predictable amount of time  Frequency and Duration min 1 x/week  1 week       Prognosis Prognosis for improved oropharyngeal function: Guarded (-Fair) Barriers to Reach Goals: Time post onset;Severity of deficits;Behavior Barriers/Prognosis Comment: chronic pharyngeal phase dysphagia since dx'd 2022 per MBSS - has not been following precautions rec'd      Swallow Study   General Date of Onset: 09/26/23 HPI: Pt is a 89 y.o. female with medical history significant of chronic hypoxic respiratory failure on 2 L at baseline, COPD, UTI, chronic bronchiectasis, CAD, CVA, type 2 diabetes, Deafness due to acoustic neuroma who presents to the ED due to shortness of breath.  Patient's son states that she has a baseline cough but it has increased although she has been on numerous rounds of antibiotics (both for COPD exacerbation and UTI).  They were hoping to wait for her PCP appointment tomorrow, but earlier today Jessica Erickson felt very weak and asked to come to the ED, which is unusual for her.  He denies noting any fevers at home.  She was on a round of antibiotics for UTI as well as prednisone last doses were over 10 days ago.  OF NOTE: in 08/2021, pt had a MBSS which revealed: "Pt appears to present w/ Chronic Mild-Mod oropharyngeal phase dysphagia w/ increased risk for aspiration thus Pulmonary impact. When using conservative swallowing strategies during po trials of thin liquids (VIA CUP) as well as food consistencies, she lessens risk for aspiration. Swallowing strategies include: SMALL, SINGLE, sips of thin liquids Via Cup SLOWLY; strong Throat Clear/Cough after swallow of thin liquids. Pt would benefit from using a Dysphagia Drink Cup such as the Rije or Provale cups -- this was discussed w/ pt's Son after the MBSS.".  Son reported that pt was still drinking w/ a straw, not using small, single sips, and was not using a Dysphagia drink Cup at home.   Chest CT  this admit: "filling of the right  bronchus intermedius with presumed retained  mucus or debris, subsequent subtotal opacification of the right  middle and lower lobes. There is also filling of the segmental right  middle and lower lobe bronchi. Query aspiration.  2. Dependent areas of ill-defined opacity in the left lower lobe,  suboptimally assessed due to motion.". Type of Study: Bedside Swallow Evaluation Previous Swallow Assessment: 08/2021 Diet Prior to this Study: Regular;Thin liquids (Level 0) Temperature Spikes Noted: No (wbc 20.0) Respiratory Status: Nasal cannula (2L baseline) History of Recent Intubation: No Behavior/Cognition: Alert;Cooperative;Pleasant mood;Distractible;Requires cueing (Deaf) Oral Cavity Assessment: Within Functional Limits Oral Care Completed by SLP: Recent completion by staff Oral Cavity - Dentition: Missing dentition (top partial) Vision: Functional for self-feeding Self-Feeding Abilities: Able to feed self;Needs assist;Needs set up;Total assist Patient Positioning: Upright in bed (sat EOB w/ SLP monitoring) Baseline Vocal Quality: Normal (when requested) Volitional Cough: Strong;Congested Volitional Swallow: Able to elicit    Oral/Motor/Sensory Function Overall Oral Motor/Sensory Function: Within functional limits   Ice Chips Ice chips: Within functional limits Presentation: Spoon (fed; 2 trials)   Thin Liquid Thin Liquid: Within functional limits Presentation: Cup;Self Fed (8 trials) Other Comments: declined further    Nectar Thick Nectar Thick Liquid: Not tested   Honey Thick Honey Thick Liquid: Not tested   Puree Puree: Within functional limits Presentation: Spoon (fed; 3 trials)   Solid     Solid: Not tested Other Comments: declined, but had eaten bites of breakfast meal w/out difficulty per Son present        Jerilynn Som, MS, CCC-SLP Speech Language Pathologist Rehab Services; [facility] [place] [phone]  (ascom) Christabelle Hanzlik 09/28/2023,4:01 PM

## 2023-09-28 NOTE — Progress Notes (Signed)
Progress Note   Patient: Jessica Erickson XBM:[id number] DOB: [birth year] DOA: 09/26/2023     0 DOS: the patient was seen and examined on 09/28/2023   Brief hospital course:   Jessica Erickson is a 89 y.o. 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   History obtained both from patient and from her son via telephone.  Jessica Erickson states that she has been experiencing increasing shortness of breath and generalized weakness for the last several days.  She endorses a cough that is productive of brown sputum.  She denies any other symptoms including chest pain, palpitations.    Patient's son states that she has a baseline cough but it has increased although she has been on numerous rounds of antibiotics (both for COPD exacerbation and UTI).  They were hoping to wait for her PCP appointment tomorrow, but earlier today Jessica Erickson felt very weak and asked to come to the ED, which is unusual for her.  He denies noting any fevers at home.  She was on a round of antibiotics for UTI as well as prednisone last doses were over 10 days ago.   ED course: On arrival to the ED, patient was normotensive at 129/57 with heart rate of 78.  She was saturating at 96% on her home 2 L with tachypnea of 30/minute.  She was afebrile at 98.3.  Initial workup notable for WBC of 17.4, potassium 3.4, glucose 176, creatinine 1.13, GFR of 45.  Lactic acid 1.2.  Chest x-ray was obtained that demonstrated a stable exam.  Patient started on vancomycin and aztreonam.  TR[facility] contacted for admission.    Assessment and Plan:  Aspiration pneumonia Acute exacerbation of bronchiectasis (HCC) Patient  presented with increased cough, shortness of breath and thick sputum that is brown in color concerning for an acute bronchiectasis flare.  Despite  treatment with doxycycline in September and recent levofloxacin, her symptoms quickly recur.  Procalcitonin is negative however this may be a false negative in the setting of recent antibiotics and steroids.  MRSA PCR negative CT scan of the chest without contrast shows filling of the right bronchus intermedius with presumed retained mucus or debris, subsequent subtotal opacification of the right middle and lower lobes. There is also filling of the segmental right middle and lower lobe bronchi. Query aspiration. Dependent areas of ill-defined opacity in the left lower lobe, suboptimally assessed due to motion. This may represent aspiration, atelectasis or pneumonia. Appreciate pulmonary input Patient is unable to expectorate any sputum Continue antibiotic therapy with Rocephin and Flagyl for presumed aspiration Continue pulmonary toilet with flutter valve Appreciate speech therapy input, patient noted to have pharyngeal phase dysphagia and high risk for aspiration (per MBSS in 2022 but Not following aspiration precautions at home per Son).  Will request palliative care consult Discussed with pulmonary who plans on repeating chest x ray and sniff test today to see if she has paralyzed right hemidiaphragm and if pneumonia have improved.     COPD with acute exacerbation (HCC) Chronic respiratory failure Patient presented with increased cough, shortness of breath and thick sputum that is likely multifactorial in the setting of acute COPD exacerbation as well as bronchiectasis.   Symptoms appear to be triggered by acute viral illness.  Respiratory viral panel is positive for rhinovirus She has been on doxycycline  and levofloxacin recently with symptoms quickly recurring. Continue oxygen supplementation at 2 L to maintain pulse oximetry greater than 88% Continue scheduled and as needed DuoNebs every 6 hours Continue Pulmicort nebulizers twice daily as well as systemic steroids Continue pulmonary  toilet     Chronic heart failure with preserved ejection fraction (HFpEF) (HCC) Patient's BNP is elevated at 134, however on examination there is only peripheral edema likely due to known lymphedema.  No other evidence of hypervolemia at this time. Not acutely exacerbated Continue diuretic therapy     Prediabetes Hyperglycemia noted, and in light of steroid therapy she may ultimately require SSI.   Last A1c of 6.0% 5 months ago Continue sliding scale coverage     Chronic kidney disease, stage 3b (HCC) Per chart review, patient has a history of CKD stage IIIb, with current creatinine of 1.13 which is improved compared to her baseline. Continue to monitor renal indices while admitted     Lymphedema Bilateral lymphedema noted   - SCDs ordered   Trigeminal neuralgia - Continue home gabapentin            Subjective: Patient is seen and examined at the bedside.  Noted to be short of breath with mild exertion (walking from the bathroom to her bed).  On room air her pulse oximetry of 82% that improved with oxygen supplementation at 2 L to 90%  Physical Exam: Vitals:   09/27/23 2200 09/28/23 0226 09/28/23 0500 09/28/23 0749  BP:  111/63  (!) 125/42  Pulse:  (!) 59  (!) 58  Resp:  18  18  Temp:  98.2 F (36.8 C)  97.9 F (36.6 C)  TempSrc:  Oral  Oral  SpO2: 96% 93%  97%  Weight:   84.7 kg   Height:       Constitutional:      General: She is not in acute distress.    Appearance: She is obese. She is not toxic-appearing.  HENT:     Head: Normocephalic and atraumatic.     Right Ear: Decreased hearing noted.     Left Ear: Decreased hearing noted.     Mouth/Throat:     Pharynx: Oropharynx is clear.     Comments:  Eyes:     Extraocular Movements: Extraocular movements intact.     Pupils: Pupils are equal, round, and reactive to light.  Neck:     Vascular: No JVD.  Cardiovascular:     Rate and Rhythm: Normal rate and regular rhythm.     Heart sounds: No murmur  heard. Pulmonary:     Effort: Pulmonary effort is normal. No accessory muscle usage or respiratory distress.     Breath sounds: Wheezing (Intermittent expiratory wheezing) and rhonchi (Diffuse throughout) present. No decreased breath sounds or rales.  Abdominal:     Palpations: Abdomen is soft.     Tenderness: There is no abdominal tenderness. There is no guarding.  Musculoskeletal:     Comments: +2 pitting edema of the lower extremities  Neurological:     Mental Status: She is alert and oriented to person, place, and time.  Psychiatric:        Mood and Affect: Mood normal.        Behavior: Behavior normal.    Data Reviewed: Labs reviewed.  CRP 5.6, creatinine 1.25 There are no new results to review at this time.  Family Communication: Informed patient of the plan of care (communicates by writing.  Patient is hard of hearing)  Disposition:  Status is: Observation The patient remains OBS appropriate and will d/c before 2 midnights.  Planned Discharge Destination: Home    Time spent: 33 minutes  Author: Lucile Shutters, MD 09/28/2023 2:52 PM  For on call review [email or web address].

## 2023-09-28 NOTE — Plan of Care (Signed)

## 2023-09-28 NOTE — Consult Note (Signed)
Consultation Note Date: 09/28/2023   Patient Name: Jessica Jessica  DOB: 1926-11-02  MRN: [id number]  Age / Sex: 89 y.o., female  PCP: Jessica Luis, MD Referring Physician: Lucile Shutters, MD  Reason for Consultation: Establishing goals of care   HPI/Brief Hospital Course: 89 y.o. 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   CT chest 10/23 IMPRESSION: 1. Filling of the right bronchus intermedius with presumed retained mucus or debris, subsequent subtotal opacification of the right middle and lower lobes. There is also filling of the segmental right middle and lower lobe bronchi. Query aspiration. 2. Dependent areas of ill-defined opacity in the left lower lobe, suboptimally assessed due to motion. This may represent aspiration, atelectasis or pneumonia.  Pulmonary consulted, Dr. Mervyn Erickson following, antibiotic coverage narrowed to treat likely aspiration PNA  ST consulted, found to have chronic pharyngeal dysphagia-recommended strict aspiration precautions and specific reommendations  Palliative medicine was consulted for assisting with goals of care conversations.  Subjective:  Extensive chart review has been completed prior to meeting patient including labs, vital signs, imaging, progress notes, orders, and available advanced directive documents from current and previous encounters.  Visited with Jessica Jessica at her bedside. She is resting in bed with eyes closed, does not acknowledge my presence in room. Caregivers at bedside during my time of visit. Caregivers share that Jessica Jessica lives with her son-Jessica Erickson who is HCPOA and they sot with Jessica Jessica during the day assisting her with bathing, dressing and ambulating. They shares Jessica Jessica is able to fed herself and at  baseline is cognitively intact. They have been her private caregivers for 2 years.  No family at bedside during time of visit. Called and spoke with son-Jessica Erickson.  Introduced myself as a Publishing rights manager as a member of the palliative care team. Explained palliative medicine is specialized medical care for people living with serious illness. It focuses on providing relief from the symptoms and stress of a serious illness. The goal is to improve quality of life for both the patient and the family.   Jessica Jessica confirms information shared above by caregivers.  Jessica Erickson voices his concern related to recurrent UTI's Jessica Jessica has had over the last 2-3 weeks. Shares from his understanding she was being treated with doxycycline for UTI but then found out this was not an appropriate option. From chart review, it seems as though doxycycline was being utilized for URI. Jessica Erickson requesting urinalysis, also being requested from daughter-Jessica Erickson who is a retired Charity fundraiser. Secure message sent to hospitalist relaying family request.  Discussed current plan of care with Jessica Jessica, Jessica Jessica being treated for aspiration pneumonia with appropriate antibiotics and ST recommending aspiration precautions. Shared with Jessica Jessica that Jessica Jessica is being followed by pulmonologist-Dr. Alwyn Erickson requests Dr. Mervyn Erickson contact his sister-Jessica Erickson to review current plan of care. Jessica Jessica also shares his brother is a physician who would also be interested in knowing what Jessica Jessica is being treated for. Secure chat sent to Dr. Mervyn Erickson relaying family request.  Jessica Jessica shares he remains hopeful for improvement and recovery, he is aware of Jessica Jessica' advanced age as well as underlying chronic lung disease. At this time, it is appropriate in providing time for outcomes and having ongoing goals of care conversations.  All questions/concerns addressed. PMT will continue to follow and support patient as needed.  Objective: Primary Diagnoses: Present on Admission:  Acute exacerbation of  bronchiectasis (HCC)  COPD with acute exacerbation (HCC)  Trigeminal neuralgia  Chronic kidney disease, stage 3b (HCC)  Prediabetes  Chronic respiratory failure with hypoxia and hypercapnia (HCC)  Chronic heart failure with preserved ejection fraction (HFpEF) (HCC)   Vital Signs: BP (!) 116/52   Pulse 69   Temp 98.9 F (37.2 C) (Oral)   Resp 17   Ht 5\' 7"  (1.702 m)   Wt 84.7 kg   LMP  (LMP Unknown)   SpO2 96%   BMI 29.25 kg/m  Pain Scale: 0-10   Pain Score: 0-No pain   IO: Intake/output summary:  Intake/Output Summary (Last 24 hours) at 09/28/2023 1739 Last data filed at 09/28/2023 1540 Gross per 24 hour  Intake 363.58 ml  Output --  Net 363.58 ml    LBM: Last BM Date : 09/27/23 Baseline Weight: Weight: 87.7 kg Most recent weight: Weight: 84.7 kg       Palliative Assessment/Data: 60%   Assessment and Plan  SUMMARY OF RECOMMENDATIONS   DNR/DNI Requests from family shared with primary attending as well as pulmonary Time for outcomes PMT to continue to follow for ongoing needs and support  Palliative Prophylaxis:   Bowel Regimen, Delirium Protocol and Frequent Pain Assessment  Thank you for this consult and allowing Palliative Medicine to participate in the care of Jessica Jessica. Palliative medicine will continue to follow and assist as needed.   Time Total: 75 minutes  Time spent includes: Detailed review of medical records (labs, imaging, vital signs), medically appropriate exam (mental status, respiratory, cardiac, skin), discussed with treatment team, counseling and educating patient, family and staff, documenting clinical information, medication management and coordination of care.   Signed by: Jessica Deed, DNP, AGNP-C Palliative Medicine    Please contact Palliative Medicine Team phone at [phone] for questions and concerns.  For individual provider: See Loretha Stapler

## 2023-09-29 DIAGNOSIS — Z515 Encounter for palliative care: Secondary | ICD-10-CM | POA: Diagnosis not present

## 2023-09-29 DIAGNOSIS — N1832 Chronic kidney disease, stage 3b: Secondary | ICD-10-CM | POA: Diagnosis present

## 2023-09-29 DIAGNOSIS — T17590A Other foreign object in bronchus causing asphyxiation, initial encounter: Secondary | ICD-10-CM | POA: Diagnosis present

## 2023-09-29 DIAGNOSIS — B9789 Other viral agents as the cause of diseases classified elsewhere: Secondary | ICD-10-CM | POA: Diagnosis present

## 2023-09-29 DIAGNOSIS — I130 Hypertensive heart and chronic kidney disease with heart failure and stage 1 through stage 4 chronic kidney disease, or unspecified chronic kidney disease: Secondary | ICD-10-CM | POA: Diagnosis present

## 2023-09-29 DIAGNOSIS — I2510 Atherosclerotic heart disease of native coronary artery without angina pectoris: Secondary | ICD-10-CM | POA: Diagnosis present

## 2023-09-29 DIAGNOSIS — J189 Pneumonia, unspecified organism: Secondary | ICD-10-CM

## 2023-09-29 DIAGNOSIS — E785 Hyperlipidemia, unspecified: Secondary | ICD-10-CM | POA: Diagnosis present

## 2023-09-29 DIAGNOSIS — Z7901 Long term (current) use of anticoagulants: Secondary | ICD-10-CM | POA: Diagnosis not present

## 2023-09-29 DIAGNOSIS — E890 Postprocedural hypothyroidism: Secondary | ICD-10-CM | POA: Diagnosis present

## 2023-09-29 DIAGNOSIS — W44F9XA Other object of natural or organic material, entering into or through a natural orifice, initial encounter: Secondary | ICD-10-CM | POA: Diagnosis present

## 2023-09-29 DIAGNOSIS — E669 Obesity, unspecified: Secondary | ICD-10-CM | POA: Diagnosis present

## 2023-09-29 DIAGNOSIS — J690 Pneumonitis due to inhalation of food and vomit: Secondary | ICD-10-CM | POA: Diagnosis present

## 2023-09-29 DIAGNOSIS — Z87891 Personal history of nicotine dependence: Secondary | ICD-10-CM | POA: Diagnosis not present

## 2023-09-29 DIAGNOSIS — R1313 Dysphagia, pharyngeal phase: Secondary | ICD-10-CM | POA: Diagnosis present

## 2023-09-29 DIAGNOSIS — J9622 Acute and chronic respiratory failure with hypercapnia: Secondary | ICD-10-CM | POA: Diagnosis present

## 2023-09-29 DIAGNOSIS — Z85828 Personal history of other malignant neoplasm of skin: Secondary | ICD-10-CM | POA: Diagnosis not present

## 2023-09-29 DIAGNOSIS — Z8249 Family history of ischemic heart disease and other diseases of the circulatory system: Secondary | ICD-10-CM | POA: Diagnosis not present

## 2023-09-29 DIAGNOSIS — R0600 Dyspnea, unspecified: Secondary | ICD-10-CM | POA: Diagnosis not present

## 2023-09-29 DIAGNOSIS — J9811 Atelectasis: Secondary | ICD-10-CM | POA: Diagnosis present

## 2023-09-29 DIAGNOSIS — I5032 Chronic diastolic (congestive) heart failure: Secondary | ICD-10-CM | POA: Diagnosis present

## 2023-09-29 DIAGNOSIS — J471 Bronchiectasis with (acute) exacerbation: Secondary | ICD-10-CM | POA: Diagnosis present

## 2023-09-29 DIAGNOSIS — Z95828 Presence of other vascular implants and grafts: Secondary | ICD-10-CM | POA: Diagnosis not present

## 2023-09-29 DIAGNOSIS — R7303 Prediabetes: Secondary | ICD-10-CM | POA: Diagnosis present

## 2023-09-29 DIAGNOSIS — Z66 Do not resuscitate: Secondary | ICD-10-CM | POA: Diagnosis present

## 2023-09-29 DIAGNOSIS — J441 Chronic obstructive pulmonary disease with (acute) exacerbation: Secondary | ICD-10-CM | POA: Diagnosis present

## 2023-09-29 DIAGNOSIS — J9621 Acute and chronic respiratory failure with hypoxia: Secondary | ICD-10-CM | POA: Diagnosis present

## 2023-09-29 LAB — URINALYSIS, COMPLETE (UACMP) WITH MICROSCOPIC
Bacteria, UA: NONE SEEN
Bilirubin Urine: NEGATIVE
Glucose, UA: NEGATIVE mg/dL
Hgb urine dipstick: NEGATIVE
Ketones, ur: NEGATIVE mg/dL
Nitrite: NEGATIVE
Protein, ur: NEGATIVE mg/dL
Specific Gravity, Urine: 1.012 (ref 1.005–1.030)
pH: 6 (ref 5.0–8.0)

## 2023-09-29 LAB — GLUCOSE, CAPILLARY
Glucose-Capillary: 115 mg/dL — ABNORMAL HIGH (ref 70–99)
Glucose-Capillary: 122 mg/dL — ABNORMAL HIGH (ref 70–99)
Glucose-Capillary: 227 mg/dL — ABNORMAL HIGH (ref 70–99)
Glucose-Capillary: 252 mg/dL — ABNORMAL HIGH (ref 70–99)

## 2023-09-29 LAB — EXPECTORATED SPUTUM ASSESSMENT W GRAM STAIN, RFLX TO RESP C

## 2023-09-29 LAB — C-REACTIVE PROTEIN: CRP: 4.5 mg/dL — ABNORMAL HIGH (ref <1.0)

## 2023-09-29 NOTE — Progress Notes (Signed)
PULMONOLOGY         Date: 09/29/2023,   MRN# [id number] Jessica Erickson June 18, 1926     AdmissionWeight: 87.7 kg                 CurrentWeight: 84.1 kg  Referring provider: Dr Joylene Igo   CHIEF COMPLAINT:   Acute on chronic hypoxemic respiratory failure   HISTORY OF PRESENT ILLNESS   Jessica Erickson is a 89 y.o. 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  She endorses a cough that is productive of brown sputum.  She denies any other symptoms including chest pain, palpitations. She finished multiple bursts of antibiotics and steroids for LRTI which has only partially improved. She was noted to have leucocytosis and hypoxemia.  PCCM consultation for further evaluation and management.     09/29/23- Patient on 3L/min [place]. She is resting in bed comfortably.  She has rhinovirus infection and her CRP is trending down.  We discussed her careplan with her contact Dayton Martes.  She is DNR and is stable.  She can be discharged home once cleared by PT/OT.  Her caregivers state they can care for her at home.    PAST MEDICAL HISTORY   Past Medical History:  Diagnosis Date   Acoustic neuroma (HCC)    Allergy    Asthma    Bilateral swelling of feet    and legs   Bladder infection    CAD (coronary artery disease)    Cataract    Change in voice    Compression fracture of body of thoracic vertebra (HCC)    T12 09/18/15 MRI s/p fall    Constipation    COPD (chronic obstructive pulmonary disease) (HCC)    previous CXR with chronic interstitial lung dz    CVA (cerebral vascular accident) (HCC)    Depression    Diabetes (HCC)    with neuropathy   Diabetes mellitus, type 2 (HCC)    Diarrhea    Double vision    DVT (deep venous thrombosis) (HCC)    right leg 10/2015 was on coumadin off as  of 2017/2018 ; s/p IVC filter   Enuresis    Eye pain, right    Fall    Fatty liver    09/15/15 also mildly dilated pancreatitic duct rec MRCP small sub cm cyst hemangioma speeln mild right hydronephrorossi and prox. hydroureter, kidney stones, mild scarring kidneys   Female stress incontinence    Flank pain    GERD (gastroesophageal reflux disease)    with small hiatal hernia    Hard of hearing    Heart disease    History of kidney problems    Hyperlipidemia    mixed   Hypertension    Hypothyroidism, postsurgical    Impaired mobility and ADLs    uses rolling walker has caretaker 24/7 at home   Leg edema    Mixed incontinence urge and stress (female)(female)    Neuropathy    Osteoarthritis    DDD spine    Osteoporosis with fracture    T12 compression fracture   Photophobia    Pulmonary embolism (HCC)    10/2015 off coumadin as of 04/2016   Pulmonary HTN (HCC)    mild pulm HTN, echo 10/09/15 EF 55-60%grade 1 dd, RV systolic pressure increased  Recurrent UTI    Sinus pressure    Skin cancer    BCC jawline and scalp    Thyroid disease    follows [facility]   TIA (transient ischemic attack)    MRI 2009/2010 neg stroke    Trigeminal neuralgia    Dr. Cammie Mcgee s/p gamma knife x 2, on Tegretol since 2011/2012 no increase in dose >200 mg bid rec per family per neurology    Urinary frequency    Urinary, incontinence, stress female    Dr Apolinar Junes urology      SURGICAL HISTORY   Past Surgical History:  Procedure Laterality Date   APPENDECTOMY     as a child, open   BRAIN SURGERY     schwnnoma removal 1996    brain tumor surgery     BREAST SURGERY     breast bx   CATARACT EXTRACTION     CHOLECYSTECTOMY     EYE SURGERY     cataract   IVC FILTER PLACEMENT ([facility] HX)     Dr. Wyn Quaker 10/2015    LAPAROSCOPIC TUBAL LIGATION     MOHS SURGERY     scalp 04/2014    PERIPHERAL VASCULAR CATHETERIZATION N/A 10/11/2015   Procedure: IVC Filter Insertion;  Surgeon: Annice Needy, MD;   Location: [facility] INVASIVE CV LAB;  Service: Cardiovascular;  Laterality: N/A;   PERIPHERAL VASCULAR THROMBECTOMY Bilateral 03/29/2018   Procedure: PERIPHERAL VASCULAR THROMBECTOMY;  Surgeon: Annice Needy, MD;  Location: [facility] INVASIVE CV LAB;  Service: Cardiovascular;  Laterality: Bilateral;   PUBOVAGINAL SLING     THROAT SURGERY     THYROID SURGERY     tumor around vocal cords    TOOTH EXTRACTION     winter 2018    TOTAL THYROIDECTOMY  1976     FAMILY HISTORY   Family History  Problem Relation Age of Onset   Heart disease Mother    Diabetes Father    Cancer Daughter        breast ca x 2 s/p mastectomy      SOCIAL HISTORY   Social History   Tobacco Use   Smoking status: Former    Current packs/day: 0.00    Average packs/day: 0.5 packs/day for 20.0 years (10.0 ttl pk-yrs)    Types: Cigarettes    Start date: 09/20/1975    Quit date: 09/20/1995    Years since quitting: 28.0   Smokeless tobacco: Never   Tobacco comments:    quit 1996 smoked 20 years max 8 cig qd   Vaping Use   Vaping status: Never Used  Substance Use Topics   Alcohol use: No   Drug use: No     MEDICATIONS    Home Medication:    Current Medication:  Current Facility-Administered Medications:    acetaminophen (TYLENOL) tablet 650 mg, 650 mg, Oral, Q6H PRN **OR** acetaminophen (TYLENOL) suppository 650 mg, 650 mg, Rectal, Q6H PRN, Verdene Lennert, MD   acetylcysteine (MUCOMYST) 20 % nebulizer / oral solution 3 mL, 3 mL, Nebulization, BID, Righteous Claiborne, MD, 3 mL at 09/29/23 0720   albuterol (PROVENTIL) (2.5 MG/3ML) 0.083% nebulizer solution 3 mL, 3 mL, Inhalation, Q2H PRN, Minna Antis, MD, 3 mL at 09/26/23 1445   apixaban (ELIQUIS) tablet 2.5 mg, 2.5 mg, Oral, BID, Verdene Lennert, MD, 2.5 mg at 09/29/23 1047   atorvastatin (LIPITOR) tablet 40 mg, 40 mg, Oral, Daily, Verdene Lennert, MD, 40 mg at 09/28/23 2205   bisacodyl (DULCOLAX) EC tablet 5 mg, 5  mg, Oral, Daily PRN, Verdene Lennert, MD    budesonide (PULMICORT) nebulizer solution 0.25 mg, 0.25 mg, Nebulization, BID, Verdene Lennert, MD, 0.25 mg at 09/29/23 0718   cefTRIAXone (ROCEPHIN) 1 g in sodium chloride 0.9 % 100 mL IVPB, 1 g, Intravenous, Q24H, Agbata, Tochukwu, MD, Last Rate: 200 mL/hr at 09/28/23 1737, 1 g at 09/28/23 1737   feeding supplement (GLUCERNA SHAKE) (GLUCERNA SHAKE) liquid 237 mL, 237 mL, Oral, BID BM, Verdene Lennert, MD, 237 mL at 09/28/23 1457   gabapentin (NEURONTIN) capsule 200 mg, 200 mg, Oral, TID, Verdene Lennert, MD, 200 mg at 09/29/23 1047   guaiFENesin (MUCINEX) 12 hr tablet 600 mg, 600 mg, Oral, BID, Verdene Lennert, MD, 600 mg at 09/29/23 1047   insulin aspart (novoLOG) injection 0-6 Units, 0-6 Units, Subcutaneous, TID WC, Verdene Lennert, MD, 3 Units at 09/28/23 1724   ipratropium-albuterol (DUONEB) 0.5-2.5 (3) MG/3ML nebulizer solution 3 mL, 3 mL, Nebulization, TID, Agbata, Tochukwu, MD, 3 mL at 09/29/23 1610   levothyroxine (SYNTHROID) tablet 150 mcg, 150 mcg, Oral, Q0600, Verdene Lennert, MD, 150 mcg at 09/29/23 9604   metroNIDAZOLE (FLAGYL) IVPB 500 mg, 500 mg, Intravenous, Q12H, Agbata, Tochukwu, MD, Last Rate: 100 mL/hr at 09/29/23 0653, 500 mg at 09/29/23 0653   mirtazapine (REMERON) tablet 15 mg, 15 mg, Oral, QHS, Verdene Lennert, MD, 15 mg at 09/28/23 2204   ondansetron (ZOFRAN) tablet 4 mg, 4 mg, Oral, Q6H PRN **OR** ondansetron (ZOFRAN) injection 4 mg, 4 mg, Intravenous, Q6H PRN, Verdene Lennert, MD   pantoprazole (PROTONIX) EC tablet 40 mg, 40 mg, Oral, Daily, Verdene Lennert, MD, 40 mg at 09/29/23 1047   polyethylene glycol (MIRALAX / GLYCOLAX) packet 17 g, 17 g, Oral, Daily PRN, Verdene Lennert, MD   [COMPLETED] methylPREDNISolone sodium succinate (SOLU-MEDROL) 125 mg/2 mL injection 125 mg, 125 mg, Intravenous, Once, 125 mg at 09/26/23 2012 **FOLLOWED BY** predniSONE (DELTASONE) tablet 40 mg, 40 mg, Oral, Q breakfast, Verdene Lennert, MD, 40 mg at 09/29/23 1046   sodium chloride flush (NS)  0.9 % injection 3 mL, 3 mL, Intravenous, Q12H, Verdene Lennert, MD, 3 mL at 09/29/23 1048    ALLERGIES   Penicillins, Sulfa antibiotics, Amitiza [lubiprostone], Aspirin, and Penicillin g     REVIEW OF SYSTEMS    Review of Systems:  Gen:  Denies  fever, sweats, chills weigh loss  HEENT: Denies blurred vision, double vision, ear pain, eye pain, hearing loss, nose bleeds, sore throat Cardiac:  No dizziness, chest pain or heaviness, chest tightness,edema Resp:   reports dyspnea chronically  Gi: Denies swallowing difficulty, stomach pain, nausea or vomiting, diarrhea, constipation, bowel incontinence Gu:  Denies bladder incontinence, burning urine Ext:   Denies Joint pain, stiffness or swelling Skin: Denies  skin rash, easy bruising or bleeding or hives Endoc:  Denies polyuria, polydipsia , polyphagia or weight change Psych:   Denies depression, insomnia or hallucinations   Other:  All other systems negative   VS: BP (!) 124/43 (BP Location: Left Arm)   Pulse (!) 57   Temp 98.1 F (36.7 C) (Oral)   Resp 20   Ht 5\' 7"  (1.702 m)   Wt 84.1 kg   LMP  (LMP Unknown)   SpO2 97%   BMI 29.02 kg/m      PHYSICAL EXAM    GENERAL:NAD, no fevers, chills, no weakness no fatigue HEAD: Normocephalic, atraumatic.  EYES: Pupils equal, round, reactive to light. Extraocular muscles intact. No scleral icterus.  MOUTH: Moist mucosal membrane. Dentition intact. No abscess noted.  EAR, NOSE, THROAT: Clear without exudates. No external lesions.  NECK: Supple. No thyromegaly. No nodules. No JVD.  PULMONARY: decreased breath sounds with mild rhonchi worse at bases bilaterally.  CARDIOVASCULAR: S1 and S2. Regular rate and rhythm. No murmurs, rubs, or gallops. No edema. Pedal pulses 2+ bilaterally.  GASTROINTESTINAL: Soft, nontender, nondistended. No masses. Positive bowel sounds. No hepatosplenomegaly.  MUSCULOSKELETAL: No swelling, clubbing, or edema. Range of motion full in all extremities.   NEUROLOGIC: Cranial nerves II through XII are intact. No gross focal neurological deficits. Sensation intact. Reflexes intact.  SKIN: No ulceration, lesions, rashes, or cyanosis. Skin warm and dry. Turgor intact.  PSYCHIATRIC: Mood, affect within normal limits. The patient is awake, alert and oriented x 3. Insight, judgment intact.       IMAGING    Narrative & Impression  CLINICAL DATA:  Respiratory illness, nondiagnostic xray History of bronchiectasis   89 year old with shortness of breath, cough and congestion.   EXAM: CT CHEST WITHOUT CONTRAST   TECHNIQUE: Multidetector CT imaging of the chest was performed following the standard protocol without IV contrast.   RADIATION DOSE REDUCTION: This exam was performed according to the departmental dose-optimization program which includes automated exposure control, adjustment of the mA and/or kV according to patient size and/or use of iterative reconstruction technique.   COMPARISON:  Radiograph earlier today.  Chest CT 08/14/2021   FINDINGS: Cardiovascular: The heart is borderline enlarged. There are coronary artery and aortic valvular calcifications. Aortic atherosclerosis and tortuosity. No pericardial effusion.   Mediastinum/Nodes: Breathing motion artifact obscures mediastinal assessment. No obvious bulky mediastinal adenopathy. There are few prominent subcarinal nodes. No esophageal wall thickening.   Lungs/Pleura: Breathing motion artifact obscures detailed parenchymal assessment. There is filling of the right bronchus intermedius with presumed retained mucus or debris, subsequent subtotal opacification of the right middle and lower lobes. There is also filling of the segmental right middle and lower lobe bronchi. Air bronchograms in the right lower lobe. Areas of bronchiectasis on prior are obscured. Dependent areas of ill-defined opacity in the left lower lobe, suboptimally assessed due to motion.  Punctate calcified granuloma in the right lower lobe. No significant pleural effusion.   Upper Abdomen: Hypodensities in the spleen are nonspecific. These were not definitively seen on prior. Large volume of stool in the included colon.   Musculoskeletal: There are no acute or suspicious osseous abnormalities. No chest wall soft tissue abnormalities.   IMPRESSION: 1. Filling of the right bronchus intermedius with presumed retained mucus or debris, subsequent subtotal opacification of the right middle and lower lobes. There is also filling of the segmental right middle and lower lobe bronchi. Query aspiration. 2. Dependent areas of ill-defined opacity in the left lower lobe, suboptimally assessed due to motion. This may represent aspiration, atelectasis or pneumonia.   Aortic Atherosclerosis (ICD10-I70.0).     Electronically Signed   By: Narda Rutherford M.D.   On: 09/26/2023 20:33     ASSESSMENT/PLAN   Right middle lobe and lower lobe atelectasis  - currently on broad spectrum abx - noted R hemidiaphragm elevation - Sniff test to evaluate phrenic nerve paralysis  -patient has PCN allergy is on flagyl  - patient is cleared for dc home, she has rhinovirus viral LRTI - her family has agreed to care for her at home and do not wish for her to have in any facility   Bilateral mucus plugging of bronchi- resolved   - mucomyst with albuterol   - flutter valve   - chest  VEST Therapy  - PT/OT    Right basilar atelectasis- improved     - Incentive spirometry    - PT/OT       Thank you for allowing me to participate in the care of this patient.   Patient/Family are satisfied with care plan and all questions have been answered.    Provider disclosure: Patient with at least one acute or chronic illness or injury that poses a threat to life or bodily function and is being managed actively during this encounter.  All of the below services have been performed independently by  signing provider:  review of prior documentation from internal and or external health records.  Review of previous and current lab results.  Interview and comprehensive assessment during patient visit today. Review of current and previous chest radiographs/CT scans. Discussion of management and test interpretation with health care team and patient/family.   This document was prepared using Dragon voice recognition software and may include unintentional dictation errors.     Vida Rigger, M.D.  Division of Pulmonary & Critical Care Medicine

## 2023-09-29 NOTE — Progress Notes (Signed)
Civil engineer, contracting Palliative Referral  Outpatient palliative referral received by Kemper Durie, RN upon discharge from [facility].  Referral sent to Referral Intake.  Thank you for allowing participation in this patient's care.  Norris Cross, RN Nurse Liaison [phone]

## 2023-09-29 NOTE — Plan of Care (Signed)

## 2023-09-29 NOTE — Progress Notes (Signed)
Progress Note   Patient: Jessica Erickson AVW:[id number] DOB: 1926/10/22 DOA: 09/26/2023     0 DOS: the patient was seen and examined on 09/29/2023   Brief hospital course:  Jessica Erickson is a 89 y.o. 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   History obtained both from patient and from her son via telephone.  Jessica Erickson states that she has been experiencing increasing shortness of breath and generalized weakness for the last several days.  She endorses a cough that is productive of brown sputum.  She denies any other symptoms including chest pain, palpitations.    Patient's son states that she has a baseline cough but it has increased although she has been on numerous rounds of antibiotics (both for COPD exacerbation and UTI).  They were hoping to wait for her PCP appointment tomorrow, but earlier today Jessica Erickson felt very weak and asked to come to the ED, which is unusual for her.  He denies noting any fevers at home.  She was on a round of antibiotics for UTI as well as prednisone last doses were over 10 days ago.   ED course: On arrival to the ED, patient was normotensive at 129/57 with heart rate of 78.  She was saturating at 96% on her home 2 L with tachypnea of 30/minute.  She was afebrile at 98.3.  Initial workup notable for WBC of 17.4, potassium 3.4, glucose 176, creatinine 1.13, GFR of 45.  Lactic acid 1.2.  Chest x-ray was obtained that demonstrated a stable exam.  Patient started on vancomycin and aztreonam.  TRH contacted for admission.    Assessment and Plan:   Aspiration pneumonia Acute exacerbation of bronchiectasis (HCC) Patient  presented with increased cough, shortness of breath and thick sputum that is brown in color concerning for an acute bronchiectasis flare.  Despite  treatment with doxycycline in September and recent levofloxacin, her symptoms quickly recur.  Procalcitonin is negative however this may be a false negative in the setting of recent antibiotics and steroids.  MRSA PCR negative CT scan of the chest without contrast shows filling of the right bronchus intermedius with presumed retained mucus or debris, subsequent subtotal opacification of the right middle and lower lobes. There is also filling of the segmental right middle and lower lobe bronchi. Query aspiration. Dependent areas of ill-defined opacity in the left lower lobe, suboptimally assessed due to motion. This may represent aspiration, atelectasis or pneumonia. Appreciate pulmonary input Continue antibiotic therapy with Rocephin and Flagyl for presumed aspiration Continue systemic steroids Continue pulmonary toilet with flutter valve Appreciate speech therapy input, patient noted to have pharyngeal phase dysphagia and high risk for aspiration (per MBSS in 2022 but Not following aspiration precautions at home per Son).  Will request palliative care consult Discussed with pulmonary who plans on repeating chest x ray and sniff test today to see if she has paralyzed right hemidiaphragm and if pneumonia have improved. Patient noted to have a positive sniff test         COPD with acute exacerbation (HCC) Chronic respiratory failure Patient presented with increased cough, shortness of breath and thick sputum that is likely multifactorial in the setting of acute COPD exacerbation as well as bronchiectasis.   Symptoms appear to be triggered by acute viral illness.  Respiratory viral panel is  positive for rhinovirus She has been on doxycycline and levofloxacin recently with symptoms quickly recurring. Continue oxygen supplementation at 2 L to maintain pulse oximetry greater than 88% Continue scheduled and as needed DuoNebs every 6 hours Continue Pulmicort nebulizers twice daily as well as systemic  steroids Continue pulmonary toilet     Chronic heart failure with preserved ejection fraction (HFpEF) (HCC) Patient's BNP is elevated at 134, however on examination there is only peripheral edema likely due to known lymphedema.  No other evidence of hypervolemia at this time. Not acutely exacerbated Continue diuretic therapy     Prediabetes Hyperglycemia noted, and in light of steroid therapy she may ultimately require SSI.   Last A1c of 6.0% 5 months ago Continue sliding scale coverage     Chronic kidney disease, stage 3b (HCC) Per chart review, patient has a history of CKD stage IIIb, with current creatinine of 1.13 which is improved compared to her baseline. Continue to monitor renal indices while admitted     Lymphedema Bilateral lymphedema noted   - SCDs ordered   Trigeminal neuralgia - Continue home gabapentin            Subjective: Patient is seen and examined at the bedside.  Coughing up copious amounts of phlegm  Physical Exam: Vitals:   09/29/23 0353 09/29/23 0419 09/29/23 0500 09/29/23 0720  BP: (!) 124/43     Pulse: (!) 27 (!) 57    Resp: 20     Temp: 98.1 F (36.7 C)     TempSrc: Oral     SpO2: 97% 97%  97%  Weight:   84.1 kg   Height:       Constitutional:      General: She is not in acute distress.    Appearance: She is obese. She is not toxic-appearing.  HENT:     Head: Normocephalic and atraumatic.     Right Ear: Decreased hearing noted.     Left Ear: Decreased hearing noted.     Mouth/Throat:     Pharynx: Oropharynx is clear.     Comments:  Eyes:     Extraocular Movements: Extraocular movements intact.     Pupils: Pupils are equal, round, and reactive to light.  Neck:     Vascular: No JVD.  Cardiovascular:     Rate and Rhythm: Normal rate and regular rhythm.     Heart sounds: No murmur heard. Pulmonary:     Effort: Pulmonary effort is normal. No accessory muscle usage or respiratory distress.     Breath sounds: Scattered rhonchi  (Diffuse throughout) present. No decreased breath sounds or rales.  Abdominal:     Palpations: Abdomen is soft.     Tenderness: There is no abdominal tenderness. There is no guarding.  Musculoskeletal:     Comments: +2 pitting edema of the lower extremities  Neurological:     Mental Status: She is alert and oriented to person, place, and time.  Psychiatric:        Mood and Affect: Mood normal.        Behavior: Behavior normal.     Data Reviewed: Labs reviewed.  CRP 4.5 down from 5.6 There are no new results to review at this time.  Family Communication: Plan of care discussed with Jessica Erickson, patient's daughter over the phone.  All questions and concerns have been addressed.  She verbalizes understanding and agrees with the plan.  Disposition: Status is: Inpatient Remains inpatient appropriate because: Continue antibiotic therapy  Planned Discharge Destination:  Home    Time spent: 33 minutes  Author: Lucile Shutters, MD 09/29/2023 11:49 AM  For on call review [email or web address].

## 2023-09-29 NOTE — TOC Progression Note (Signed)
Transition of Care [facility]) - Progression Note    Patient Details  Name: Jessica Erickson MRN: [id number] Date of Birth: 16-May-1926  Transition of [facility]) CM/SW Contact  Kemper Durie, RN Phone Number: 09/29/2023, 4:04 PM  Clinical Narrative:     [facility] consult received for outpatient palliative care. Spoke with daughter Jessica Erickson, denies any preference, agrees to have [facility] contact either her or patient's son Jessica Erickson.  Gwendolyn Grant made aware of referral.  TOC will continue to follow for needs.       Expected Discharge Plan and Services                                               Social Determinants of Health (SDOH) Interventions SDOH Screenings   Food Insecurity: No Food Insecurity (09/26/2023)  Housing: Low Risk  (09/26/2023)  Transportation Needs: No Transportation Needs (09/26/2023)  Utilities: Not At Risk (09/26/2023)  Depression (PHQ2-9): Low Risk  (09/11/2023)  Recent Concern: Depression (PHQ2-9) - Medium Risk (08/14/2023)  Financial Resource Strain: Low Risk  (01/18/2023)  Physical Activity: Insufficiently Active (01/18/2023)  Social Connections: Unknown (01/18/2023)  Stress: No Stress Concern Present (01/18/2023)  Tobacco Use: Medium Risk (09/26/2023)    Readmission Risk Interventions    08/15/2021    9:47 AM  Readmission Risk Prevention Plan  Transportation Screening Complete  Medication Review (RN Care Manager) Complete  PCP or Specialist appointment within 3-5 days of discharge Complete  HRI or Home Care Consult Complete  SW Recovery Care/Counseling Consult Complete  Palliative Care Screening Complete  Skilled Nursing Facility Complete

## 2023-09-29 NOTE — Progress Notes (Signed)
Daily Progress Note   Patient Name: Jessica Erickson       Date: 09/29/2023 DOB: Mar 16, 1926  Age: 89 y.o. MRN#: [id number] Attending Physician: Lucile Shutters, MD Primary Care Physician: Glori Luis, MD Admit Date: 09/26/2023  Reason for Consultation/Follow-up: Establishing goals of care  HPI/Brief Hospital Review:  89 y.o. 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    CT chest 10/23 IMPRESSION: 1. Filling of the right bronchus intermedius with presumed retained mucus or debris, subsequent subtotal opacification of the right middle and lower lobes. There is also filling of the segmental right middle and lower lobe bronchi. Query aspiration. 2. Dependent areas of ill-defined opacity in the left lower lobe, suboptimally assessed due to motion. This may represent aspiration, atelectasis or pneumonia.   Pulmonary consulted, Dr. Mervyn Skeeters following, antibiotic coverage narrowed to treat likely aspiration PNA   ST consulted, found to have chronic pharyngeal dysphagia-recommended strict aspiration precautions and specific reommendations   Palliative medicine was consulted for assisting with goals of care conversations.   Subjective: Extensive chart review has been completed prior to meeting patient including labs, vital signs, imaging, progress notes, orders, and available advanced directive documents from current and previous encounters.    Visited with Jessica Erickson at her bedside in collaboration with Dr. Karna Christmas. Jessica Erickson is resting in bed with eyes closed, remains on 3L [place] and in no apparent  distress.  Called and spoke with Jessica Erickson in collaboration with Dr. Mervyn Skeeters as requested by Jessica Erickson.  Dr. Karna Christmas provided medical updates to Jessica Erickson, Jessica Erickson tested positive for rhinovirus and is being treated for possible aspiration pneumonia.  Jessica Erickson shares they wish for Jessica Erickson to return home as they have caregivers already in place, they are not interested in seeking placement.  We discussed the role of outpatient palliative care and Jessica Erickson agrees to referral being placed at discharge.  Answered and addressed all questions and concerns. Anticipate discharge home in next 24-48 hours. Encouraged family to reach out to PMT for any needs or concerns as they arise.  Care plan was discussed with primary and pulmonary team.  Thank you for allowing the Palliative Medicine Team  to assist in the care of this patient.  Total time:  25 minutes  Time spent includes: Detailed review of medical records (labs, imaging, vital signs), medically appropriate exam (mental status, respiratory, cardiac, skin), discussed with treatment team, counseling and educating patient, family and staff, documenting clinical information, medication management and coordination of care.  Leeanne Deed, DNP, AGNP-C Palliative Medicine   Please contact Palliative Medicine Team phone at [phone] for questions and concerns.

## 2023-09-30 DIAGNOSIS — J471 Bronchiectasis with (acute) exacerbation: Secondary | ICD-10-CM | POA: Diagnosis not present

## 2023-09-30 LAB — GLUCOSE, CAPILLARY
Glucose-Capillary: 98 mg/dL (ref 70–99)
Glucose-Capillary: 145 mg/dL — ABNORMAL HIGH (ref 70–99)
Glucose-Capillary: 205 mg/dL — ABNORMAL HIGH (ref 70–99)
Glucose-Capillary: 204 mg/dL — ABNORMAL HIGH (ref 70–99)

## 2023-09-30 LAB — EXPECTORATED SPUTUM ASSESSMENT W GRAM STAIN, RFLX TO RESP C

## 2023-09-30 LAB — C-REACTIVE PROTEIN: CRP: 3.3 mg/dL — ABNORMAL HIGH (ref <1.0)

## 2023-09-30 MED ORDER — IPRATROPIUM-ALBUTEROL 0.5-2.5 (3) MG/3ML IN SOLN
3.0000 mL | Freq: Two times a day (BID) | RESPIRATORY_TRACT | Status: DC
  Administered 2023-09-30 – 2023-10-01 (×2): 3 mL via RESPIRATORY_TRACT
  Filled 2023-09-30 (×2): qty 3

## 2023-09-30 MED ORDER — CEFUROXIME AXETIL 500 MG PO TABS: 500.0000 mg | ORAL_TABLET | Freq: Two times a day (BID) | ORAL | 0 refills | Status: AC

## 2023-09-30 MED ORDER — PREDNISONE 20 MG PO TABS: 40.0000 mg | ORAL_TABLET | Freq: Every day | ORAL | 0 refills | Status: AC

## 2023-09-30 NOTE — Plan of Care (Signed)
  Problem: Health Behavior/Discharge Planning: Goal: Ability to manage health-related needs will improve Outcome: Progressing   

## 2023-09-30 NOTE — Progress Notes (Signed)
PULMONOLOGY         Date: 09/30/2023,   MRN# [id number] Jessica Erickson 07/30/1926     AdmissionWeight: 87.7 kg                 CurrentWeight: 85.4 kg  Referring provider: Dr Joylene Erickson   CHIEF COMPLAINT:   Acute on chronic hypoxemic respiratory failure   HISTORY OF PRESENT ILLNESS   Jessica Erickson is a 89 y.o. 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  She endorses a cough that is productive of brown sputum.  She denies any other symptoms including chest pain, palpitations. She finished multiple bursts of antibiotics and steroids for LRTI which has only partially improved. She was noted to have leucocytosis and hypoxemia.  PCCM consultation for further evaluation and management.     09/29/23- Patient on 3L/min [place]. She is resting in bed comfortably.  She has rhinovirus infection and her CRP is trending down.  We discussed her careplan with her contact Jessica Erickson.  She is DNR and is stable.  She can be discharged home once cleared by PT/OT.  Her caregivers state they can care for her at home.    09/30/23- patient resting comfortably.  CRP is treding down appropriately. Rhinovirus LRTI is improved.  Patient is being optimized for dc.  PCCM will sign off at this time.   PAST MEDICAL HISTORY   Past Medical History:  Diagnosis Date   Acoustic neuroma (HCC)    Allergy    Asthma    Bilateral swelling of feet    and legs   Bladder infection    CAD (coronary artery disease)    Cataract    Change in voice    Compression fracture of body of thoracic vertebra (HCC)    T12 09/18/15 MRI s/p fall    Constipation    COPD (chronic obstructive pulmonary disease) (HCC)    previous CXR with chronic interstitial lung dz    CVA (cerebral vascular accident) (HCC)    Depression    Diabetes  (HCC)    with neuropathy   Diabetes mellitus, type 2 (HCC)    Diarrhea    Double vision    DVT (deep venous thrombosis) (HCC)    right leg 10/2015 was on coumadin off as of 2017/2018 ; s/p IVC filter   Enuresis    Eye pain, right    Fall    Fatty liver    09/15/15 also mildly dilated pancreatitic duct rec MRCP small sub cm cyst hemangioma speeln mild right hydronephrorossi and prox. hydroureter, kidney stones, mild scarring kidneys   Female stress incontinence    Flank pain    GERD (gastroesophageal reflux disease)    with small hiatal hernia    Hard of hearing    Heart disease    History of kidney problems    Hyperlipidemia    mixed   Hypertension    Hypothyroidism, postsurgical    Impaired mobility and ADLs    uses rolling walker has caretaker 24/7 at home   Leg edema    Mixed incontinence urge and stress (female)(female)    Neuropathy    Osteoarthritis    DDD spine    Osteoporosis with fracture    T12 compression fracture   Photophobia    Pulmonary embolism (HCC)  10/2015 off coumadin as of 04/2016   Pulmonary HTN (HCC)    mild pulm HTN, echo 10/09/15 EF 55-60%grade 1 dd, RV systolic pressure increased    Recurrent UTI    Sinus pressure    Skin cancer    BCC jawline and scalp    Thyroid disease    follows [facility]   TIA (transient ischemic attack)    MRI 2009/2010 neg stroke    Trigeminal neuralgia    Jessica Erickson s/p gamma knife x 2, on Tegretol since 2011/2012 no increase in dose >200 mg bid rec per family per neurology    Urinary frequency    Urinary, incontinence, stress female    Jessica Erickson urology      SURGICAL HISTORY   Past Surgical History:  Procedure Laterality Date   APPENDECTOMY     as a child, open   BRAIN SURGERY     schwnnoma removal 1996    brain tumor surgery     BREAST SURGERY     breast bx   CATARACT EXTRACTION     CHOLECYSTECTOMY     EYE SURGERY     cataract   IVC FILTER PLACEMENT ([facility] HX)     Jessica Erickson 10/2015     LAPAROSCOPIC TUBAL LIGATION     MOHS SURGERY     scalp 04/2014    PERIPHERAL VASCULAR CATHETERIZATION N/A 10/11/2015   Procedure: IVC Filter Insertion;  Surgeon: Jessica Needy, MD;  Location: [facility] INVASIVE CV [facility];  Service: Cardiovascular;  Laterality: N/A;   PERIPHERAL VASCULAR THROMBECTOMY Bilateral 03/29/2018   Procedure: PERIPHERAL VASCULAR THROMBECTOMY;  Surgeon: Jessica Needy, MD;  Location: [facility] INV[facility] CV [facility];  Service: Cardiovascular;  Laterality: Bilateral;   PUBOVAGINAL SLING     THROAT SURGERY     THYROID SURGERY     tumor around vocal cords    TOOTH EXTRACTION     winter 2018    TOTAL THYROIDECTOMY  1976     FAMILY HISTORY   Family History  Problem Relation Age of Onset   Heart disease Mother    Diabetes Father    Cancer Daughter        breast ca x 2 s/p mastectomy      SOCIAL HISTORY   Social History   Tobacco Use   Smoking status: Former    Current packs/day: 0.00    Average packs/day: 0.5 packs/day for 20.0 years (10.0 ttl pk-yrs)    Types: Cigarettes    Start date: 09/20/1975    Quit date: 09/20/1995    Years since quitting: 28.0   Smokeless tobacco: Never   Tobacco comments:    quit 1996 smoked 20 years max 8 cig qd   Vaping Use   Vaping status: Never Used  Substance Use Topics   Alcohol use: No   Drug use: No     MEDICATIONS    Home Medication:  Current Outpatient Rx   Order #: [id number] Class: Normal   Order #: [id number] Class: Normal    Current Medication:  Current Facility-Administered Medications:    acetaminophen (TYLENOL) tablet 650 mg, 650 mg, Oral, Q6H PRN **OR** acetaminophen (TYLENOL) suppository 650 mg, 650 mg, Rectal, Q6H PRN, Jessica Lennert, MD   acetylcysteine (MUCOMYST) 20 % nebulizer / oral solution 3 mL, 3 mL, Nebulization, BID, Jessica Heisler, MD, 3 mL at 09/30/23 0840   albuterol (PROVENTIL) (2.5 MG/3ML) 0.083% nebulizer solution 3 mL, 3 mL, Inhalation, Q2H PRN, Jessica Antis, MD, 3 mL at 09/26/23 1445  apixaban  (ELIQUIS) tablet 2.5 mg, 2.5 mg, Oral, BID, Jessica Lennert, MD, 2.5 mg at 09/30/23 1049   atorvastatin (LIPITOR) tablet 40 mg, 40 mg, Oral, Daily, Jessica Lennert, MD, 40 mg at 09/29/23 2223   bisacodyl (DULCOLAX) EC tablet 5 mg, 5 mg, Oral, Daily PRN, Jessica Lennert, MD   budesonide (PULMICORT) nebulizer solution 0.25 mg, 0.25 mg, Nebulization, BID, Jessica Lennert, MD, 0.25 mg at 09/30/23 0840   cefTRIAXone (ROCEPHIN) 1 g in sodium chloride 0.9 % 100 mL IVPB, 1 g, Intravenous, Q24H, Agbata, Tochukwu, MD, Last Rate: 200 mL/hr at 09/29/23 1832, 1 g at 09/29/23 1832   feeding supplement (GLUCERNA SHAKE) (GLUCERNA SHAKE) liquid 237 mL, 237 mL, Oral, BID BM, Jessica Lennert, MD, 237 mL at 09/28/23 1457   gabapentin (NEURONTIN) capsule 200 mg, 200 mg, Oral, TID, Jessica Lennert, MD, 200 mg at 09/30/23 1049   guaiFENesin (MUCINEX) 12 hr tablet 600 mg, 600 mg, Oral, BID, Jessica Lennert, MD, 600 mg at 09/30/23 1049   insulin aspart (novoLOG) injection 0-6 Units, 0-6 Units, Subcutaneous, TID WC, Jessica Lennert, MD, 2 Units at 09/29/23 1657   ipratropium-albuterol (DUONEB) 0.5-2.5 (3) MG/3ML nebulizer solution 3 mL, 3 mL, Nebulization, BID, Agbata, Tochukwu, MD   levothyroxine (SYNTHROID) tablet 150 mcg, 150 mcg, Oral, Q0600, Jessica Lennert, MD, 150 mcg at 09/30/23 0830   metroNIDAZOLE (FLAGYL) IVPB 500 mg, 500 mg, Intravenous, Q12H, Agbata, Tochukwu, MD, Last Rate: 100 mL/hr at 09/30/23 0445, 500 mg at 09/30/23 0445   mirtazapine (REMERON) tablet 15 mg, 15 mg, Oral, QHS, Jessica Lennert, MD, 15 mg at 09/29/23 2224   ondansetron (ZOFRAN) tablet 4 mg, 4 mg, Oral, Q6H PRN **OR** ondansetron (ZOFRAN) injection 4 mg, 4 mg, Intravenous, Q6H PRN, Jessica Lennert, MD   pantoprazole (PROTONIX) EC tablet 40 mg, 40 mg, Oral, Daily, Jessica Lennert, MD, 40 mg at 09/30/23 1049   polyethylene glycol (MIRALAX / GLYCOLAX) packet 17 g, 17 g, Oral, Daily PRN, Jessica Lennert, MD   [COMPLETED] methylPREDNISolone sodium  succinate (SOLU-MEDROL) 125 mg/2 mL injection 125 mg, 125 mg, Intravenous, Once, 125 mg at 09/26/23 2012 **FOLLOWED BY** predniSONE (DELTASONE) tablet 40 mg, 40 mg, Oral, Q breakfast, Jessica Lennert, MD, 40 mg at 09/30/23 1049   sodium chloride flush (NS) 0.9 % injection 3 mL, 3 mL, Intravenous, Q12H, Jessica Lennert, MD, 3 mL at 09/30/23 1050    ALLERGIES   Penicillins, Sulfa antibiotics, Amitiza [lubiprostone], Aspirin, and Penicillin g     REVIEW OF SYSTEMS    Review of Systems:  Gen:  Denies  fever, sweats, chills weigh loss  HEENT: Denies blurred vision, double vision, ear pain, eye pain, hearing loss, nose bleeds, sore throat Cardiac:  No dizziness, chest pain or heaviness, chest tightness,edema Resp:   reports dyspnea chronically  Gi: Denies swallowing difficulty, stomach pain, nausea or vomiting, diarrhea, constipation, bowel incontinence Gu:  Denies bladder incontinence, burning urine Ext:   Denies Joint pain, stiffness or swelling Skin: Denies  skin rash, easy bruising or bleeding or hives Endoc:  Denies polyuria, polydipsia , polyphagia or weight change Psych:   Denies depression, insomnia or hallucinations   Other:  All other systems negative   VS: BP (!) 137/52 (BP Location: Left Arm)   Pulse 71   Temp 97.7 F (36.5 C)   Resp 20   Ht 5\' 7"  (1.702 m)   Wt 85.4 kg   LMP  (LMP Unknown)   SpO2 96%   BMI 29.49 kg/m      PHYSICAL EXAM  GENERAL:NAD, no fevers, chills, no weakness no fatigue HEAD: Normocephalic, atraumatic.  EYES: Pupils equal, round, reactive to light. Extraocular muscles intact. No scleral icterus.  MOUTH: Moist mucosal membrane. Dentition intact. No abscess noted.  EAR, NOSE, THROAT: Clear without exudates. No external lesions.  NECK: Supple. No thyromegaly. No nodules. No JVD.  PULMONARY: decreased breath sounds with mild rhonchi worse at bases bilaterally.  CARDIOVASCULAR: S1 and S2. Regular rate and rhythm. No murmurs, rubs, or  gallops. No edema. Pedal pulses 2+ bilaterally.  GASTROINTESTINAL: Soft, nontender, nondistended. No masses. Positive bowel sounds. No hepatosplenomegaly.  MUSCULOSKELETAL: No swelling, clubbing, or edema. Range of motion full in all extremities.  NEUROLOGIC: Cranial nerves II through XII are intact. No gross focal neurological deficits. Sensation intact. Reflexes intact.  SKIN: No ulceration, lesions, rashes, or cyanosis. Skin warm and dry. Turgor intact.  PSYCHIATRIC: Mood, affect within normal limits. The patient is awake, alert and oriented x 3. Insight, judgment intact.       IMAGING    Narrative & Impression  CLINICAL DATA:  Respiratory illness, nondiagnostic xray History of bronchiectasis   89 year old with shortness of breath, cough and congestion.   EXAM: CT CHEST WITHOUT CONTRAST   TECHNIQUE: Multidetector CT imaging of the chest was performed following the standard protocol without IV contrast.   RADIATION DOSE REDUCTION: This exam was performed according to the departmental dose-optimization program which includes automated exposure control, adjustment of the mA and/or kV according to patient size and/or use of iterative reconstruction technique.   COMPARISON:  Radiograph earlier today.  Chest CT 08/14/2021   FINDINGS: Cardiovascular: The heart is borderline enlarged. There are coronary artery and aortic valvular calcifications. Aortic atherosclerosis and tortuosity. No pericardial effusion.   Mediastinum/Nodes: Breathing motion artifact obscures mediastinal assessment. No obvious bulky mediastinal adenopathy. There are few prominent subcarinal nodes. No esophageal wall thickening.   Lungs/Pleura: Breathing motion artifact obscures detailed parenchymal assessment. There is filling of the right bronchus intermedius with presumed retained mucus or debris, subsequent subtotal opacification of the right middle and lower lobes. There is also filling of the  segmental right middle and lower lobe bronchi. Air bronchograms in the right lower lobe. Areas of bronchiectasis on prior are obscured. Dependent areas of ill-defined opacity in the left lower lobe, suboptimally assessed due to motion. Punctate calcified granuloma in the right lower lobe. No significant pleural effusion.   Upper Abdomen: Hypodensities in the spleen are nonspecific. These were not definitively seen on prior. Large volume of stool in the included colon.   Musculoskeletal: There are no acute or suspicious osseous abnormalities. No chest wall soft tissue abnormalities.   IMPRESSION: 1. Filling of the right bronchus intermedius with presumed retained mucus or debris, subsequent subtotal opacification of the right middle and lower lobes. There is also filling of the segmental right middle and lower lobe bronchi. Query aspiration. 2. Dependent areas of ill-defined opacity in the left lower lobe, suboptimally assessed due to motion. This may represent aspiration, atelectasis or pneumonia.   Aortic Atherosclerosis (ICD10-I70.0).     Electronically Signed   By: Narda Rutherford M.D.   On: 09/26/2023 20:33     ASSESSMENT/PLAN   Right middle lobe and lower lobe atelectasis  - currently on broad spectrum abx - noted R hemidiaphragm elevation - Sniff test to evaluate phrenic nerve paralysis  -patient has PCN allergy is on flagyl  - patient is cleared for dc home, she has rhinovirus viral LRTI - her family has agreed to care for her  at home and do not wish for her to have in any facility   Bilateral mucus plugging of bronchi- resolved   - mucomyst with albuterol   - flutter valve   - chest VEST Therapy  - PT/OT    Right basilar atelectasis- improved     - Incentive spirometry    - PT/OT       Thank you for allowing me to participate in the care of this patient.   Patient/Family are satisfied with care plan and all questions have been answered.     Provider disclosure: Patient with at least one acute or chronic illness or injury that poses a threat to life or bodily function and is being managed actively during this encounter.  All of the below services have been performed independently by signing provider:  review of prior documentation from internal and or external health records.  Review of previous and current lab results.  Interview and comprehensive assessment during patient visit today. Review of current and previous chest radiographs/CT scans. Discussion of management and test interpretation with health care team and patient/family.   This document was prepared using Dragon voice recognition software and may include unintentional dictation errors.     Vida Rigger, M.D.  Division of Pulmonary & Critical Care Medicine

## 2023-09-30 NOTE — Discharge Summary (Signed)
Physician Discharge Summary   Patient: Jessica Erickson MRN: [id number] DOB: May 04, 1926  Admit date:     09/26/2023  Discharge date: 09/30/23  Discharge Physician: Katy Brickell   PCP: Glori Luis, MD   Recommendations at discharge:    Complete antibiotic therapy as prescribed  Discharge Diagnoses: Principal Problem:   Acute exacerbation of bronchiectasis (HCC) Active Problems:   COPD with acute exacerbation (HCC)   Chronic respiratory failure with hypoxia and hypercapnia (HCC)   Chronic heart failure with preserved ejection fraction (HFpEF) (HCC)   Trigeminal neuralgia   Lymphedema   Chronic kidney disease, stage 3b (HCC)   Prediabetes  Resolved Problems:   * No resolved hospital problems. *  Hospital Course:   Jessica Erickson is a 89 y.o. 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   History obtained both from patient and from her son via telephone.  [name] states that she has been experiencing increasing shortness of breath and generalized weakness for the last several days.  She endorses a cough that is productive of brown sputum.  She denies any other symptoms including chest pain, palpitations.    Patient's son states that she has a baseline cough but it has increased although she has been on numerous rounds of antibiotics (both for COPD exacerbation and UTI).  They were hoping to wait for her PCP appointment tomorrow, but earlier today [name] felt very weak and asked to come to the ED, which is unusual for her.  He denies noting any fevers at home.  She was on a round of antibiotics for UTI as well as prednisone last doses were over 10 days ago.   ED course: On arrival to the ED, patient was normotensive at 129/57 with heart rate of 78.  She was saturating at 96%  on her home 2 L with tachypnea of 30/minute.  She was afebrile at 98.3.  Initial workup notable for WBC of 17.4, potassium 3.4, glucose 176, creatinine 1.13, GFR of 45.  Lactic acid 1.2.  Chest x-ray was obtained that demonstrated a stable exam.  Patient started on vancomycin and aztreonam.  [facility] contacted for admission.    Assessment and Plan:   Aspiration pneumonia Acute exacerbation of bronchiectasis (HCC) Patient  presented with increased cough, shortness of breath and thick sputum that is brown in color concerning for an acute bronchiectasis flare.  Despite treatment with doxycycline in September and recent levofloxacin, her symptoms quickly recur.  Procalcitonin is negative however this may be a false negative in the setting of recent antibiotics and steroids.  MRSA PCR negative CT scan of the chest without contrast shows filling of the right bronchus intermedius with presumed retained mucus or debris, subsequent subtotal opacification of the right middle and lower lobes. There is also filling of the segmental right middle and lower lobe bronchi. Query aspiration. Dependent areas of ill-defined opacity in the left lower lobe, suboptimally assessed due to motion. This may represent aspiration, atelectasis or pneumonia. Appreciate pulmonary input Patient was treated with Rocephin and Flagyl for presumed aspiration and will be discharged home on Ceftin 500mg  BID Continue systemic steroids Continue pulmonary toilet with flutter valve Appreciate speech therapy input, patient noted to have pharyngeal phase dysphagia and high risk for aspiration (per MBSS in 2022 but Not following aspiration precautions at  home per Son).  Will request palliative care consult Patient noted to have a positive sniff test         COPD with acute exacerbation (HCC) Chronic respiratory failure Patient presented with increased cough, shortness of breath and thick sputum that is likely multifactorial in the setting of  acute COPD exacerbation as well as bronchiectasis.   Symptoms appear to be triggered by acute viral illness.  Respiratory viral panel is positive for rhinovirus She had been on doxycycline and levofloxacin recently with symptoms quickly recurring. Continue oxygen supplementation at 2 L to maintain pulse oximetry greater than 88% Continue scheduled and as needed DuoNebs every 6 hours Continue Pulmicort nebulizers twice daily as well as systemic steroids Continue pulmonary toilet     Chronic heart failure with preserved ejection fraction (HFpEF) (HCC) Patient's BNP is elevated at 134, however on examination there is only peripheral edema likely due to known lymphedema.  No other evidence of hypervolemia at this time. Not acutely exacerbated Continue diuretic therapy     Prediabetes Hyperglycemia noted, and in light of steroid therapy she may ultimately require SSI.   Last A1c of 6.0% 5 months ago Continue sliding scale coverage     Chronic kidney disease, stage 3b (HCC) Per chart review, patient has a history of CKD stage IIIb, with current creatinine of 1.13 which is improved compared to her baseline. Continue to monitor renal indices while admitted     Lymphedema Bilateral lymphedema noted     Trigeminal neuralgia - Continue home gabapentin               Consultants: Pulmonary Procedures performed: None  Disposition: Home Diet recommendation:  Discharge Diet Orders (From admission, onward)     Start     Ordered   09/30/23 0000  Diet - low sodium heart healthy        09/30/23 1042           Cardiac diet DISCHARGE MEDICATION: Allergies as of 09/30/2023       Reactions   Penicillins Shortness Of Breath, Rash, Other (See Comments)   Has patient had a PCN reaction causing immediate rash, facial/tongue/throat swelling, SOB or lightheadedness with hypotension: Yes Has patient had a PCN reaction causing severe rash involving mucus membranes or skin necrosis:  No Has patient had a PCN reaction that required hospitalization No Has patient had a PCN reaction occurring within the last 10 years: No If all of the above answers are "NO", then may proceed with Cephalosporin use.   Sulfa Antibiotics Shortness Of Breath, Rash, Other (See Comments)   Amitiza [lubiprostone]    N/v/d   Aspirin Other (See Comments)   Reaction:  Unknown  Other reaction(s): Bleeding (intolerance) Per patient " causes nose to bleed" Can take 81 mg daily without any complications Other reaction(s): "bloody nose"   Penicillin G Other (See Comments)   Has patient had a PCN reaction causing immediate rash, facial/tongue/throat swelling, SOB or lightheadedness with hypotension: No Has patient had a PCN reaction causing severe rash involving mucus membranes or skin necrosis: Unknown Has patient had a PCN reaction that required hospitalization: Unknown Has patient had a PCN reaction occurring within the last 10 years: Unknown If all of the above answers are "NO", then may proceed with Cephalosporin use.        Medication List     TAKE these medications    Accu-Chek Aviva Plus w/Device Kit Use as directed   Accu-Chek Softclix Lancets lancets Use as instructed  acetaminophen 325 MG tablet Commonly known as: TYLENOL Take 650 mg by mouth 2 (two) times daily as needed for mild pain (pain score 1-3).   albuterol (2.5 MG/3ML) 0.083% nebulizer solution Commonly known as: PROVENTIL Take 3 mLs (2.5 mg total) by nebulization in the morning, at noon, and at bedtime.   apixaban 2.5 MG Tabs tablet Commonly known as: Eliquis Take 1 tablet (2.5 mg total) by mouth 2 (two) times daily.   atorvastatin 40 MG tablet Commonly known as: LIPITOR Take 1 tablet (40 mg total) by mouth daily.   bisacodyl 5 MG EC tablet Commonly known as: DULCOLAX Take 5 mg by mouth daily as needed for moderate constipation.   calcium-vitamin D 500-5 MG-MCG tablet Commonly known as: OSCAL WITH D Take  1 tablet by mouth 2 (two) times daily. Unable to find former dose 600ca/400 vitamin D inform patient dose change Oscal   cefUROXime 500 MG tablet Commonly known as: CEFTIN Take 1 tablet (500 mg total) by mouth 2 (two) times daily with a meal for 5 days.   cetirizine 10 MG tablet Commonly known as: ZYRTEC Take 1 tablet (10 mg total) by mouth daily.   docusate sodium 100 MG capsule Commonly known as: COLACE Take 100 mg by mouth 2 (two) times daily.   feeding supplement (GLUCERNA SHAKE) Liqd Take 237 mLs by mouth 2 (two) times daily between meals.   furosemide 40 MG tablet Commonly known as: LASIX TAKE ONE TABLET (40MG ) DAILY. MAY TAKE ASECOND DOSE OF 40MG  IF NEEDED AT LUNCH FOR 5 DAYS   gabapentin 100 MG capsule Commonly known as: NEURONTIN TAKE 2 CAPSULES BY MOUTH 3 TIMES DAILY.   glucose blood test strip Use to test blood sugar twice daily.   guaiFENesin 600 MG 12 hr tablet Commonly known as: MUCINEX Take 2 tablets (1,200 mg total) by mouth 2 (two) times daily as needed for cough or to loosen phlegm. What changed: how much to take   [facility] Max Lidocaine 4-1 % Crea Generic drug: Lidocaine-Menthol Apply 1 application. topically 3 (three) times daily with meals as needed. Dispense cream or spray pt preference   ipratropium-albuterol 0.5-2.5 (3) MG/3ML Soln Commonly known as: DUONEB TAKE 3 ML BY NEBULIZATION EVERY 4 HOURS AS NEEDED   levothyroxine 150 MCG tablet Commonly known as: SYNTHROID TAKE ONE TABLET BY MOUTH EVERY DAY BEFORE BREAKFAST   Melatonin 10 MG Tabs Take 10 mg by mouth at bedtime.   mirtazapine 15 MG tablet Commonly known as: Remeron Take 1 tablet (15 mg total) by mouth at bedtime.   multivitamin with minerals Tabs tablet Take 1 tablet by mouth daily.   pantoprazole 40 MG tablet Commonly known as: PROTONIX Take 1 tablet (40 mg total) by mouth daily. 30 minutes before lunch or dinner   polyethylene glycol powder 17 GM/SCOOP powder Commonly known  as: GLYCOLAX/MIRALAX Take 17 g by mouth daily.   polyvinyl alcohol 1.4 % ophthalmic solution Commonly known as: LIQUIFILM TEARS Place 1 drop into both eyes at bedtime.   predniSONE 20 MG tablet Commonly known as: DELTASONE Take 2 tablets (40 mg total) by mouth daily with breakfast for 3 days.   Trelegy Ellipta 100-62.5-25 MCG/ACT Aepb Generic drug: Fluticasone-Umeclidin-Vilant INHALE ONE PUFF DAILY. RINSE MOUTH AFTERUSE. STOP SYMBICORT & SPIRIVA.        Discharge Exam: Filed Weights   09/28/23 0500 09/29/23 0500 09/30/23 0400  Weight: 84.7 kg 84.1 kg 85.4 kg     General: She is not in acute distress.  Appearance: She is obese. She is not toxic-appearing.  HENT:     Head: Normocephalic and atraumatic.     Right Ear: Decreased hearing noted.     Left Ear: Decreased hearing noted.     Mouth/Throat:     Pharynx: Oropharynx is clear.     Comments:  Eyes:     Extraocular Movements: Extraocular movements intact.     Pupils: Pupils are equal, round, and reactive to light.  Neck:     Vascular: No JVD.  Cardiovascular:     Rate and Rhythm: Normal rate and regular rhythm.     Heart sounds: No murmur heard. Pulmonary:     Effort: Pulmonary effort is normal. No accessory muscle usage or respiratory distress.     Breath sounds: BLAE Abdominal:     Palpations: Abdomen is soft.     Tenderness: There is no abdominal tenderness. There is no guarding.  Musculoskeletal:     Comments: +2 pitting edema of the lower extremities  Neurological:     Mental Status: She is alert and oriented to person, place, and time.  Psychiatric:        Mood and Affect: Mood normal.        Behavior: Behavior normal.      Condition at discharge: stable  The results of significant diagnostics from this hospitalization (including imaging, microbiology, ancillary and laboratory) are listed below for reference.   Imaging Studies: DG Chest Port 1 View  Result Date: 09/28/2023 CLINICAL DATA:   Right lower lobe pulmonary infiltrate EXAM: PORTABLE CHEST 1 VIEW COMPARISON:  Chest x-ray October 23, 24. FINDINGS: all right pleural effusion with bibasilar opacities. No visible pneumothorax. Cardiomediastinal silhouette is similar to prior. No acute bony abnormality. IMPRESSION: Small right pleural effusion with bibasilar opacities, which could represent atelectasis, aspiration, and/or pneumonia. Electronically Signed   By: Feliberto Harts M.D.   On: 09/28/2023 13:25   DG Sniff Test  Result Date: 09/28/2023 CLINICAL DATA:  Elevated right hemidiaphragm, concern for right hemidiaphragmatic paralysis. EXAM: CHEST FLUOROSCOPY TECHNIQUE: Real-time fluoroscopic evaluation of the chest was performed. FLUOROSCOPY: Radiation Exposure Index (as provided by the fluoroscopic device): 2.50 mGy reference air Kerma. COMPARISON:  Chest radiograph 09/28/2023. FINDINGS: The left hemidiaphragm demonstrates normal excursion. Right hemidiaphragm is elevated and demonstrates mildly decreased downward movement with inspiration compared to left hemidiaphragm. No paradoxical motion was visualized today. IMPRESSION: Elevated right hemidiaphragm with mildly decreased movement during inspiration, potentially indicative of mild right hemidiaphragmatic paresis. Exam conducted by Mina Marble, PA-C, and supervised by Orvan Falconer, MD, PhD. Electronically Signed   By: Orvan Falconer M.D.   On: 09/28/2023 12:41   CT CHEST WO CONTRAST  Result Date: 09/26/2023 CLINICAL DATA:  Respiratory illness, nondiagnostic xray History of bronchiectasis 89 year old with shortness of breath, cough and congestion. EXAM: CT CHEST WITHOUT CONTRAST TECHNIQUE: Multidetector CT imaging of the chest was performed following the standard protocol without IV contrast. RADIATION DOSE REDUCTION: This exam was performed according to the departmental dose-optimization program which includes automated exposure control, adjustment of the mA and/or kV according  to patient size and/or use of iterative reconstruction technique. COMPARISON:  Radiograph earlier today.  Chest CT 08/14/2021 FINDINGS: Cardiovascular: The heart is borderline enlarged. There are coronary artery and aortic valvular calcifications. Aortic atherosclerosis and tortuosity. No pericardial effusion. Mediastinum/Nodes: Breathing motion artifact obscures mediastinal assessment. No obvious bulky mediastinal adenopathy. There are few prominent subcarinal nodes. No esophageal wall thickening. Lungs/Pleura: Breathing motion artifact obscures detailed parenchymal assessment. There is filling of  the right bronchus intermedius with presumed retained mucus or debris, subsequent subtotal opacification of the right middle and lower lobes. There is also filling of the segmental right middle and lower lobe bronchi. Air bronchograms in the right lower lobe. Areas of bronchiectasis on prior are obscured. Dependent areas of ill-defined opacity in the left lower lobe, suboptimally assessed due to motion. Punctate calcified granuloma in the right lower lobe. No significant pleural effusion. Upper Abdomen: Hypodensities in the spleen are nonspecific. These were not definitively seen on prior. Large volume of stool in the included colon. Musculoskeletal: There are no acute or suspicious osseous abnormalities. No chest wall soft tissue abnormalities. IMPRESSION: 1. Filling of the right bronchus intermedius with presumed retained mucus or debris, subsequent subtotal opacification of the right middle and lower lobes. There is also filling of the segmental right middle and lower lobe bronchi. Query aspiration. 2. Dependent areas of ill-defined opacity in the left lower lobe, suboptimally assessed due to motion. This may represent aspiration, atelectasis or pneumonia. Aortic Atherosclerosis (ICD10-I70.0). Electronically Signed   By: Narda Rutherford M.D.   On: 09/26/2023 20:33   DG Chest 2 View  Result Date:  09/26/2023 CLINICAL DATA:  Shortness of breath. EXAM: CHEST - 2 VIEW COMPARISON:  08/14/2023. FINDINGS: Low lung volume. Redemonstration of elevated right hemidiaphragm. There are areas of atelectasis/scarring at the lung bases, essentially similar to the prior study. No acute consolidation or lung collapse. Redemonstration of increased interstitial markings throughout bilateral lungs, likely representing underlying chronic lung parenchymal changes. No overt pulmonary edema. Stable cardio-mediastinal silhouette. No acute osseous abnormalities. The soft tissues are within normal limits. IMPRESSION: *Essentially stable exam.  No active cardiopulmonary disease. Electronically Signed   By: Jules Schick M.D.   On: 09/26/2023 16:06    Microbiology: Results for orders placed or performed during the hospital encounter of 09/26/23  Blood Culture (routine x 2)     Status: None (Preliminary result)   Collection Time: 09/26/23  3:27 PM   Specimen: BLOOD  Result Value Ref Range Status   Specimen Description BLOOD BLOOD RIGHT FOREARM  Final   Special Requests   Final    BOTTLES DRAWN AEROBIC AND ANAEROBIC Blood Culture adequate volume   Culture   Final    NO GROWTH 4 DAYS Performed at [facility], [address], [place], [place] [address]    Report Status PENDING  Incomplete  Blood Culture (routine x 2)     Status: None (Preliminary result)   Collection Time: 09/26/23  3:32 PM   Specimen: BLOOD  Result Value Ref Range Status   Specimen Description BLOOD LEFT ANTECUBITAL  Final   Special Requests   Final    BOTTLES DRAWN AEROBIC AND ANAEROBIC Blood Culture results may not be optimal due to an excessive volume of blood received in culture bottles   Culture   Final    NO GROWTH 4 DAYS Performed at [facility], [address], [place], [place] [address]    Report Status PENDING  Incomplete  Resp panel by RT-PCR (RSV, Flu A&B, Covid) Anterior Nasal Swab     Status: None    Collection Time: 09/26/23  3:46 PM   Specimen: Anterior Nasal Swab  Result Value Ref Range Status   SARS Coronavirus 2 by RT PCR NEGATIVE NEGATIVE Final    Comment: (NOTE) SARS-CoV-2 target nucleic acids are NOT DETECTED.  The SARS-CoV-2 RNA is generally detectable in upper respiratory specimens during the acute phase of infection. The lowest  concentration of SARS-CoV-2 viral copies this assay can detect is 138 copies/mL. A negative result does not preclude SARS-Cov-2 infection and should not be used as the sole basis for treatment or other patient management decisions. A negative result may occur with  improper specimen collection/handling, submission of specimen other than nasopharyngeal swab, presence of viral mutation(s) within the areas targeted by this assay, and inadequate number of viral copies(<138 copies/mL). A negative result must be combined with clinical observations, patient history, and epidemiological information. The expected result is Negative.  Fact Sheet for Patients:  BloggerCourse.com  Fact Sheet for Healthcare Providers:  SeriousBroker.it  This test is no t yet approved or cleared by the [place] FDA and  has been authorized for detection and/or diagnosis of SARS-CoV-2 by FDA under an Emergency Use Authorization (EUA). This EUA will remain  in effect (meaning this test can be used) for the duration of the COVID-19 declaration under Section 564(b)(1) of the Act, 21 U.S.C.section 360bbb-3(b)(1), unless the authorization is terminated  or revoked sooner.       Influenza A by PCR NEGATIVE NEGATIVE Final   Influenza B by PCR NEGATIVE NEGATIVE Final    Comment: (NOTE) The Xpert Xpress SARS-CoV-2/FLU/RSV plus assay is intended as an aid in the diagnosis of influenza from Nasopharyngeal swab specimens and should not be used as a sole basis for treatment. Nasal washings and aspirates are unacceptable for  Xpert Xpress SARS-CoV-2/FLU/RSV testing.  Fact Sheet for Patients: BloggerCourse.com  Fact Sheet for Healthcare Providers: SeriousBroker.it  This test is not yet approved or cleared by the [place] FDA and has been authorized for detection and/or diagnosis of SARS-CoV-2 by FDA under an Emergency Use Authorization (EUA). This EUA will remain in effect (meaning this test can be used) for the duration of the COVID-19 declaration under Section 564(b)(1) of the Act, 21 U.S.C. section 360bbb-3(b)(1), unless the authorization is terminated or revoked.     Resp Syncytial Virus by PCR NEGATIVE NEGATIVE Final    Comment: (NOTE) Fact Sheet for Patients: BloggerCourse.com  Fact Sheet for Healthcare Providers: SeriousBroker.it  This test is not yet approved or cleared by the [place] FDA and has been authorized for detection and/or diagnosis of SARS-CoV-2 by FDA under an Emergency Use Authorization (EUA). This EUA will remain in effect (meaning this test can be used) for the duration of the COVID-19 declaration under Section 564(b)(1) of the Act, 21 U.S.C. section 360bbb-3(b)(1), unless the authorization is terminated or revoked.  Performed at [facility], [address] [place], [place] [address]   MRSA Next Gen by PCR, Nasal     Status: None   Collection Time: 09/26/23  6:28 PM   Specimen: Nasal Mucosa; Nasal Swab  Result Value Ref Range Status   MRSA by PCR Next Gen NOT DETECTED NOT DETECTED Final    Comment: (NOTE) The GeneXpert MRSA Assay (FDA approved for NASAL specimens only), is one component of a comprehensive MRSA colonization surveillance program. It is not intended to diagnose MRSA infection nor to guide or monitor treatment for MRSA infections. Test performance is not FDA approved in patients less than 25 years old. Performed at [facility],  [address] [place], [place] [address]   Respiratory (~20 pathogens) panel by PCR     Status: Abnormal   Collection Time: 09/26/23  6:28 PM   Specimen: Nasal Mucosa; Respiratory  Result Value Ref Range Status   Adenovirus NOT DETECTED NOT DETECTED Final   Coronavirus 229E NOT  DETECTED NOT DETECTED Final    Comment: (NOTE) The Coronavirus on the Respiratory Panel, DOES NOT test for the novel  Coronavirus (2019 nCoV)    Coronavirus HKU1 NOT DETECTED NOT DETECTED Final   Coronavirus NL63 NOT DETECTED NOT DETECTED Final   Coronavirus OC43 NOT DETECTED NOT DETECTED Final   Metapneumovirus NOT DETECTED NOT DETECTED Final   Rhinovirus / Enterovirus DETECTED (A) NOT DETECTED Final   Influenza A NOT DETECTED NOT DETECTED Final   Influenza B NOT DETECTED NOT DETECTED Final   Parainfluenza Virus 1 NOT DETECTED NOT DETECTED Final   Parainfluenza Virus 2 NOT DETECTED NOT DETECTED Final   Parainfluenza Virus 3 NOT DETECTED NOT DETECTED Final   Parainfluenza Virus 4 NOT DETECTED NOT DETECTED Final   Respiratory Syncytial Virus NOT DETECTED NOT DETECTED Final   Bordetella pertussis NOT DETECTED NOT DETECTED Final   Bordetella Parapertussis NOT DETECTED NOT DETECTED Final   Chlamydophila pneumoniae NOT DETECTED NOT DETECTED Final   Mycoplasma pneumoniae NOT DETECTED NOT DETECTED Final    Comment: Performed at [facility], [address], [place], [place] [address]  Expectorated Sputum Assessment w Gram Stain, Rflx to Resp Cult     Status: None   Collection Time: 09/29/23  7:30 PM   Specimen: Expectorated Sputum  Result Value Ref Range Status   Specimen Description EXPECTORATED SPUTUM  Final   Special Requests NONE  Final   Sputum evaluation   Final    Sputum specimen not acceptable for testing.  Please recollect.   CALLED TO BRENDA TATE 09/29/2023 2000 CP Performed at [facility], [address] [place], [place] [address]    Report Status 09/29/2023 FINAL  Final     Labs: CBC: Recent Labs  Lab 09/26/23 1252 09/27/23 0522  WBC 17.4* 20.2*  HGB 12.2 11.3*  HCT 38.8 35.0*  MCV 95.6 93.8  PLT 160 136*   Basic Metabolic Panel: Recent Labs  Lab 09/26/23 1252 09/27/23 0522  NA 140 139  K 3.4* 3.7  CL 101 101  CO2 30 29  GLUCOSE 176* 233*  BUN 14 20  CREATININE 1.13* 1.25*  CALCIUM 8.0* 8.1*  MG  --  2.1   Liver Function Tests: Recent Labs  Lab 09/26/23 1252  AST 25  ALT 14  ALKPHOS 48  BILITOT 1.6*  PROT 6.5  ALBUMIN 3.4*   CBG: Recent Labs  Lab 09/29/23 0805 09/29/23 1205 09/29/23 1621 09/29/23 2021 09/30/23 0811  GLUCAP 115* 122* 227* 252* 98    Discharge time spent: greater than 30 minutes.  Signed: Lucile Shutters, MD [facility] 09/30/2023

## 2023-09-30 NOTE — Plan of Care (Signed)

## 2023-09-30 NOTE — Progress Notes (Signed)
SLP Cancellation Note  Patient Details Name: Jessica Erickson MRN: [id number] DOB: 1926-01-17   Cancelled treatment:       Reason Eval/Treat Not Completed:  (Per chart review, pt d/c'ing home today with OP Palliative Care services. SLP to sign off at this time.)  Clyde Canterbury, M.S., CCC-SLP Speech-Language Pathologist [facility] [phone] Jessica Erickson)  Jessica Erickson 09/30/2023, 10:46 AM

## 2023-09-30 NOTE — TOC Transition Note (Signed)
Transition of Care [facility]) - CM/SW Discharge Note   Patient Details  Name: Jessica Erickson MRN: [id number] Date of Birth: December 21, 1925  Transition of Care [facility]) CM/SW Contact:  Kemper Durie, RN Phone Number: 09/30/2023, 12:01 PM   Clinical Narrative:     Patient with discharge orders today.  Spoke with son, he is aware and will provide transportation.  Confirms that patient already has needed DME at home, including home O2.  [facility] notified of discharge.    Final next level of care: Home/Self Care (with palliative) Barriers to Discharge: Barriers Resolved   Patient Goals and CMS Choice      Discharge Placement                         Discharge Plan and Services Additional resources added to the After Visit Summary for                                       Social Determinants of Health (SDOH) Interventions SDOH Screenings   Food Insecurity: No Food Insecurity (09/26/2023)  Housing: Low Risk  (09/26/2023)  Transportation Needs: No Transportation Needs (09/26/2023)  Utilities: Not At Risk (09/26/2023)  Depression (PHQ2-9): Low Risk  (09/11/2023)  Recent Concern: Depression (PHQ2-9) - Medium Risk (08/14/2023)  Financial Resource Strain: Low Risk  (01/18/2023)  Physical Activity: Insufficiently Active (01/18/2023)  Social Connections: Unknown (01/18/2023)  Stress: No Stress Concern Present (01/18/2023)  Tobacco Use: Medium Risk (09/26/2023)     Readmission Risk Interventions    08/15/2021    9:47 AM  Readmission Risk Prevention Plan  Transportation Screening Complete  Medication Review (RN Care Manager) Complete  PCP or Specialist appointment within 3-5 days of discharge Complete  HRI or Home Care Consult Complete  SW Recovery Care/Counseling Consult Complete  Palliative Care Screening Complete  Skilled Nursing Facility Complete

## 2023-10-01 ENCOUNTER — Telehealth: Payer: Self-pay

## 2023-10-01 DIAGNOSIS — J471 Bronchiectasis with (acute) exacerbation: Secondary | ICD-10-CM | POA: Diagnosis not present

## 2023-10-01 LAB — BASIC METABOLIC PANEL
Anion gap: 9 (ref 5–15)
BUN: 21 mg/dL (ref 8–23)
CO2: 29 mmol/L (ref 22–32)
Calcium: 7.6 mg/dL — ABNORMAL LOW (ref 8.9–10.3)
Chloride: 104 mmol/L (ref 98–111)
Creatinine, Ser: 0.87 mg/dL (ref 0.44–1.00)
GFR, Estimated: >60 mL/min (ref >60)
Glucose, Bld: 109 mg/dL — ABNORMAL HIGH (ref 70–99)
Potassium: 3.1 mmol/L — ABNORMAL LOW (ref 3.5–5.1)
Sodium: 142 mmol/L (ref 135–145)

## 2023-10-01 LAB — CBC
HCT: 33.2 % — ABNORMAL LOW (ref 36.0–46.0)
Hemoglobin: 11 g/dL — ABNORMAL LOW (ref 12.0–15.0)
MCH: 30.6 pg (ref 26.0–34.0)
MCHC: 33.1 g/dL (ref 30.0–36.0)
MCV: 92.2 fL (ref 80.0–100.0)
Platelets: 170 10*3/uL (ref 150–400)
RBC: 3.6 MIL/uL — ABNORMAL LOW (ref 3.87–5.11)
RDW: 14.2 % (ref 11.5–15.5)
WBC: 8 10*3/uL (ref 4.0–10.5)
nRBC: 0 % (ref 0.0–0.2)

## 2023-10-01 LAB — GLUCOSE, CAPILLARY: Glucose-Capillary: 94 mg/dL (ref 70–99)

## 2023-10-01 LAB — CULTURE, BLOOD (ROUTINE X 2)
Culture: NO GROWTH
Culture: NO GROWTH
Special Requests: ADEQUATE

## 2023-10-01 MED ORDER — POTASSIUM CHLORIDE CRYS ER 20 MEQ PO TBCR
40.0000 meq | EXTENDED_RELEASE_TABLET | Freq: Once | ORAL | Status: AC
  Administered 2023-10-01: 40 meq via ORAL
  Filled 2023-10-01: qty 2

## 2023-10-01 NOTE — Plan of Care (Signed)
  Problem: Education: Goal: Knowledge of General Education information will improve Description: Including pain rating scale, medication(s)/side effects and non-pharmacologic comfort measures Outcome: Progressing   Problem: Health Behavior/Discharge Planning: Goal: Ability to manage health-related needs will improve Outcome: Progressing   Problem: Clinical Measurements: Goal: Will remain free from infection Outcome: Progressing Goal: Respiratory complications will improve Outcome: Progressing Goal: Cardiovascular complication will be avoided Outcome: Progressing   Problem: Activity: Goal: Risk for activity intolerance will decrease Outcome: Progressing   Problem: Nutrition: Goal: Adequate nutrition will be maintained Outcome: Progressing   Problem: Pain Management: Goal: General experience of comfort will improve Outcome: Progressing   Problem: Safety: Goal: Ability to remain free from injury will improve Outcome: Progressing   Problem: Skin Integrity: Goal: Risk for impaired skin integrity will decrease Outcome: Progressing

## 2023-10-01 NOTE — Care Management Important Message (Signed)
Important Message  Patient Details  Name: Jessica Erickson MRN: [id number] Date of Birth: [birth year]   Important Message Given:  N/A - LOS <3 / Initial given by admissions     Olegario Messier A [name] 10/01/2023, 9:49 AM

## 2023-10-01 NOTE — TOC Transition Note (Signed)
Transition of Care [facility]) - Progression Note    Patient Details  Name: Jessica Erickson MRN: [id number] Date of Birth: 1926-02-23  Transition of Care [facility]) CM/SW Contact  Truddie Hidden, RN Phone Number: 10/01/2023, 10:27 AM  Clinical Narrative:    Spoke with patient's son, Cletis Athens regarding therapy. He wishes for patient o have HHPT therapy. He would like [facility] as patient has had them in the past. Cletis Athens has been advised the accepting agency will contact him directly to scheduled SOC within 48 post discharge.  Referral sent and accepted by Adelina Mings at [facility].  TOC signing off.      Barriers to Discharge: Barriers Resolved  Expected Discharge Plan and Services         Expected Discharge Date: 10/01/23                                     Social Determinants of Health (SDOH) Interventions SDOH Screenings   Food Insecurity: No Food Insecurity (09/26/2023)  Housing: Low Risk  (09/26/2023)  Transportation Needs: No Transportation Needs (09/26/2023)  Utilities: Not At Risk (09/26/2023)  Depression (PHQ2-9): Low Risk  (09/11/2023)  Recent Concern: Depression (PHQ2-9) - Medium Risk (08/14/2023)  Financial Resource Strain: Low Risk  (01/18/2023)  Physical Activity: Insufficiently Active (01/18/2023)  Social Connections: Unknown (01/18/2023)  Stress: No Stress Concern Present (01/18/2023)  Tobacco Use: Medium Risk (09/26/2023)    Readmission Risk Interventions    08/15/2021    9:47 AM  Readmission Risk Prevention Plan  Transportation Screening Complete  Medication Review (RN Care Manager) Complete  PCP or Specialist appointment within 3-5 days of discharge Complete  HRI or Home Care Consult Complete  SW Recovery Care/Counseling Consult Complete  Palliative Care Screening Complete  Skilled Nursing Facility Complete

## 2023-10-01 NOTE — Progress Notes (Signed)
Progress Note   Patient: Jessica Erickson WUX:[id number] DOB: 06-07-1926 DOA: 09/26/2023     2 DOS: the patient was seen and examined on 10/01/2023   Brief hospital course: Jessica Erickson is a 89 y.o. 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   History obtained both from patient and from her son via telephone.  Jessica Erickson states that she has been experiencing increasing shortness of breath and generalized weakness for the last several days.  She endorses a cough that is productive of brown sputum.  She denies any other symptoms including chest pain, palpitations.    Patient's son states that she has a baseline cough but it has increased although she has been on numerous rounds of antibiotics (both for COPD exacerbation and UTI).  They were hoping to wait for her PCP appointment tomorrow, but earlier today Jessica Erickson felt very weak and asked to come to the ED, which is unusual for her.  He denies noting any fevers at home.  She was on a round of antibiotics for UTI as well as prednisone last doses were over 10 days ago.   ED course: On arrival to the ED, patient was normotensive at 129/57 with heart rate of 78.  She was saturating at 96% on her home 2 L with tachypnea of 30/minute.  She was afebrile at 98.3.  Initial workup notable for WBC of 17.4, potassium 3.4, glucose 176, creatinine 1.13, GFR of 45.  Lactic acid 1.2.  Chest x-ray was obtained that demonstrated a stable exam.  Patient started on vancomycin and aztreonam.  TRH contacted for admission.         Assessment and Plan:  Aspiration pneumonia Acute exacerbation of bronchiectasis (HCC) Patient  presented with increased cough, shortness of breath and thick sputum that is brown in color concerning for an acute bronchiectasis flare.  Despite  treatment with doxycycline in September and recent levofloxacin, her symptoms quickly recur.  Procalcitonin is negative however this may be a false negative in the setting of recent antibiotics and steroids.  MRSA PCR negative CT scan of the chest without contrast shows filling of the right bronchus intermedius with presumed retained mucus or debris, subsequent subtotal opacification of the right middle and lower lobes. There is also filling of the segmental right middle and lower lobe bronchi. Query aspiration. Dependent areas of ill-defined opacity in the left lower lobe, suboptimally assessed due to motion. This may represent aspiration, atelectasis or pneumonia. Appreciate pulmonary input Patient was treated with Rocephin and Flagyl for presumed aspiration and will be discharged home on Ceftin 500mg  BID Continue systemic steroids Continue pulmonary toilet with flutter valve Appreciate speech therapy input, patient noted to have pharyngeal phase dysphagia and high risk for aspiration (per MBSS in 2022 but Not following aspiration precautions at home per Son).  Will request palliative care consult Patient noted to have a positive sniff test         COPD with acute exacerbation (HCC) Chronic respiratory failure Patient presented with increased cough, shortness of breath and thick sputum that is likely multifactorial in the setting of acute COPD exacerbation as well as bronchiectasis.   Symptoms appear to be triggered by acute viral illness.  Respiratory viral panel is positive for rhinovirus She had been on doxycycline and levofloxacin recently with symptoms quickly recurring.  Continue oxygen supplementation at 2 L to maintain pulse oximetry greater than 88% Continue scheduled and as needed DuoNebs every 6 hours Continue Pulmicort nebulizers twice daily as well as systemic steroids Continue pulmonary toilet     Chronic heart failure with preserved ejection fraction (HFpEF) (HCC) Patient's  BNP is elevated at 134, however on examination there is only peripheral edema likely due to known lymphedema.  No other evidence of hypervolemia at this time. Not acutely exacerbated Continue diuretic therapy     Prediabetes Hyperglycemia noted, and in light of steroid therapy she may ultimately require SSI.   Last A1c of 6.0% 5 months ago Continue sliding scale coverage     Chronic kidney disease, stage 3b (HCC) Per chart review, patient has a history of CKD stage IIIb, with current creatinine of 1.13 which is improved compared to her baseline. Continue to monitor renal indices while admitted     Lymphedema Bilateral lymphedema noted     Trigeminal neuralgia - Continue home gabapentin              Subjective: No new complaints.  Caregiver at the bedside  Physical Exam: Vitals:   09/30/23 2039 10/01/23 0404 10/01/23 0410 10/01/23 0800  BP: (!) 127/56 (!) 154/64  (!) 140/70  Pulse:  (!) 59  64  Resp: 20 20    Temp: 97.6 F (36.4 C) 97.7 F (36.5 C)  97.6 F (36.4 C)  TempSrc: Oral Oral  Oral  SpO2: 94% 95%  90%  Weight:   88.3 kg   Height:        Constitutional:      General: She is not in acute distress.    Appearance: She is obese. She is not toxic-appearing.  HENT:     Head: Normocephalic and atraumatic.     Right Ear: Decreased hearing noted.     Left Ear: Decreased hearing noted.     Mouth/Throat:     Pharynx: Oropharynx is clear.     Comments:  Eyes:     Extraocular Movements: Extraocular movements intact.     Pupils: Pupils are equal, round, and reactive to light.  Neck:     Vascular: No JVD.  Cardiovascular:     Rate and Rhythm: Normal rate and regular rhythm.     Heart sounds: No murmur heard. Pulmonary:     Effort: Pulmonary effort is normal. No accessory muscle usage or respiratory distress.     Breath sounds: Scattered rhonchi (Diffuse throughout) present. No decreased breath sounds or rales.  Abdominal:     Palpations: Abdomen is  soft.     Tenderness: There is no abdominal tenderness. There is no guarding.  Musculoskeletal:     Comments: +2 pitting edema of the lower extremities  Neurological:     Mental Status: She is alert and oriented to person, place, and time.  Psychiatric:        Mood and Affect: Mood normal.        Behavior: Behavior normal.   Data Reviewed: Labs reviewed.  Potassium 3.1 There are no new results to review at this time.  Family Communication: Plan of care discussed with patient's son over the phone.  All questions and concerns have been addressed.  He verbalizes understanding and agrees to the plan.  Disposition: Status is: Inpatient Remains inpatient appropriate because: Discharge planning  Planned Discharge Destination: Home with Home Health    Time spent: 30 minutes  Author: Lucile Shutters, MD 10/01/2023 10:03 AM  For on  call review [email or web address].

## 2023-10-01 NOTE — Telephone Encounter (Signed)
Star called from [facility] to state patient received a palliative care referral while she was a patient in [facility].  Star states she would like for Dr. [name] to know that patient is now being seen by palliative care.

## 2023-10-02 ENCOUNTER — Telehealth: Payer: Self-pay

## 2023-10-02 DIAGNOSIS — I130 Hypertensive heart and chronic kidney disease with heart failure and stage 1 through stage 4 chronic kidney disease, or unspecified chronic kidney disease: Secondary | ICD-10-CM | POA: Diagnosis not present

## 2023-10-02 DIAGNOSIS — J471 Bronchiectasis with (acute) exacerbation: Secondary | ICD-10-CM | POA: Diagnosis not present

## 2023-10-02 DIAGNOSIS — J9811 Atelectasis: Secondary | ICD-10-CM | POA: Diagnosis not present

## 2023-10-02 DIAGNOSIS — J690 Pneumonitis due to inhalation of food and vomit: Secondary | ICD-10-CM | POA: Diagnosis not present

## 2023-10-02 DIAGNOSIS — J9611 Chronic respiratory failure with hypoxia: Secondary | ICD-10-CM | POA: Diagnosis not present

## 2023-10-02 DIAGNOSIS — B348 Other viral infections of unspecified site: Secondary | ICD-10-CM | POA: Diagnosis not present

## 2023-10-02 DIAGNOSIS — J440 Chronic obstructive pulmonary disease with acute lower respiratory infection: Secondary | ICD-10-CM | POA: Diagnosis not present

## 2023-10-02 DIAGNOSIS — J069 Acute upper respiratory infection, unspecified: Secondary | ICD-10-CM | POA: Diagnosis not present

## 2023-10-02 DIAGNOSIS — J441 Chronic obstructive pulmonary disease with (acute) exacerbation: Secondary | ICD-10-CM | POA: Diagnosis not present

## 2023-10-02 NOTE — Telephone Encounter (Signed)
Noted  

## 2023-10-02 NOTE — Telephone Encounter (Signed)
noted 

## 2023-10-02 NOTE — Telephone Encounter (Signed)
Star called from [facility] and wanted to let provider know that the patient is being seen by Palliative care.

## 2023-10-02 NOTE — Transitions of Care (Post Inpatient/ED Visit) (Signed)
10/02/2023  Name: Jessica Erickson MRN: [id number] DOB: July 31, 1926  Today's TOC FU Call Status: Today's TOC FU Call Status:: Successful TOC FU Call Completed TOC FU Call Complete Date: 10/02/23 Patient's Name and Date of Birth confirmed.  Transition Care Management Follow-up Telephone Call Date of Discharge: 10/01/23 Discharge Facility: [facility]) Type of Discharge: Inpatient Admission Primary Inpatient Discharge Diagnosis:: COPD Exacerbation How have you been since you were released from the hospital?: Same Any questions or concerns?: No  Items Reviewed: Did you receive and understand the discharge instructions provided?: Yes Medications obtained,verified, and reconciled?: Yes (Medications Reviewed) Any new allergies since your discharge?: No Dietary orders reviewed?: NA Do you have support at home?: Yes People in Home: child(ren), adult Name of Support/Comfort Primary Source: Caisey Chery  Medications Reviewed Today: Medications Reviewed Today     Reviewed by Redge Gainer, RN (Case Manager) on 10/02/23 at 1337  Med List Status: <None>   Medication Order Taking? Sig Documenting Provider Last Dose Status Informant  Accu-Chek Softclix Lancets lancets [id number] No Use as instructed Eulis Foster, FNP Taking Active Child  acetaminophen (TYLENOL) 325 MG tablet [id number] No Take 650 mg by mouth 2 (two) times daily as needed for mild pain (pain score 1-3). [provider] 09/26/2023 Active Child  albuterol (PROVENTIL) (2.5 MG/3ML) 0.083% nebulizer solution [id number] No Take 3 mLs (2.5 mg total) by nebulization in the morning, at noon, and at bedtime. McLean-Scocuzza, Pasty Spillers, MD 09/26/2023 Active Child  apixaban (ELIQUIS) 2.5 MG TABS tablet [id number] No Take 1 tablet (2.5 mg total) by mouth 2 (two) times daily. Glori Luis, MD 09/26/2023 0800 Active Child           Med Note Harlow Asa Sep 26, 2023  8:24 PM) 0800,2000   atorvastatin (LIPITOR) 40 MG tablet [id number] No Take 1 tablet (40 mg total) by mouth daily. Glori Luis, MD 09/25/2023 2000 Active Child  bisacodyl (DULCOLAX) 5 MG EC tablet [id number] No Take 5 mg by mouth daily as needed for moderate constipation. [provider] prn unknown Active Child  Blood Glucose Monitoring Suppl (ACCU-CHEK AVIVA PLUS) w/Device KIT [id number] No Use as directed McLean-Scocuzza, Pasty Spillers, MD Taking Active Child  calcium-vitamin D (OSCAL WITH D) 500-5 MG-MCG tablet [id number] No Take 1 tablet by mouth 2 (two) times daily. Unable to find former dose 600ca/400 vitamin D inform patient dose change Anise Salvo, MD 09/26/2023 1200 Active Child  cefUROXime (CEFTIN) 500 MG tablet [id number]  Take 1 tablet (500 mg total) by mouth 2 (two) times daily with a meal for 5 days. Lucile Shutters, MD  Active   cetirizine (ZYRTEC) 10 MG tablet [id number] No Take 1 tablet (10 mg total) by mouth daily. Glori Luis, MD 09/26/2023 0800 Active Child  docusate sodium (COLACE) 100 MG capsule [id number] No Take 100 mg by mouth 2 (two) times daily. [provider] 09/26/2023 0800 Active Child  feeding supplement, GLUCERNA SHAKE, (GLUCERNA SHAKE) LIQD [id number] No Take 237 mLs by mouth 2 (two) times daily between meals. Alberteen Sam, MD Taking Active Child  furosemide (LASIX) 40 MG tablet [id number] No TAKE ONE TABLET (40MG ) DAILY. MAY TAKE ASECOND DOSE OF 40MG  IF NEEDED AT LUNCH FOR 5 DAYS Glori Luis, MD 09/26/2023 0800 Active Child  gabapentin (NEURONTIN) 100 MG capsule [id number] No TAKE 2 CAPSULES BY MOUTH 3 TIMES DAILY. Glori Luis, MD 09/26/2023 Active Child  Med Note Sharia Reeve   Wed Sep 26, 2023  8:23 PM) Taken at 0800, 1600, 2200  glucose blood test strip [id number] No Use to test blood sugar twice daily. Eulis Foster, FNP Taking Active Child  guaiFENesin (MUCINEX) 600 MG 12 hr tablet [id number] No Take 2 tablets  (1,200 mg total) by mouth 2 (two) times daily as needed for cough or to loosen phlegm.  Patient taking differently: Take 600 mg by mouth 2 (two) times daily as needed for cough or to loosen phlegm.   Coralyn Helling, MD 09/26/2023 0800 Active Child           Med Note Harlow Asa Sep 26, 2023  8:27 PM) 2440,1027  ipratropium-albuterol (DUONEB) 0.5-2.5 (3) MG/3ML SOLN [id number] No TAKE 3 ML BY NEBULIZATION EVERY 4 HOURS AS NEEDED Glori Luis, MD prn unknown Active Child  levothyroxine (SYNTHROID) 150 MCG tablet [id number] No TAKE ONE TABLET BY MOUTH EVERY DAY BEFORE BREAKFAST Glori Luis, MD 09/26/2023 Active Child  Lidocaine-Menthol (ICY HOT MAX LIDOCAINE) 4-1 % CREA [id number] No Apply 1 application. topically 3 (three) times daily with meals as needed. Dispense cream or spray pt preference McLean-Scocuzza, Pasty Spillers, MD prn unknown Active Child  Melatonin 10 MG TABS [id number] No Take 10 mg by mouth at bedtime. McLean-Scocuzza, Pasty Spillers, MD 09/25/2023 Active Child  mirtazapine (REMERON) 15 MG tablet [id number] No Take 1 tablet (15 mg total) by mouth at bedtime. Glori Luis, MD 09/25/2023 Active Child  Multiple Vitamin (MULTIVITAMIN WITH MINERALS) TABS tablet [id number] No Take 1 tablet by mouth daily.  [provider] 09/26/2023 0800 Active Child  pantoprazole (PROTONIX) 40 MG tablet [id number] No Take 1 tablet (40 mg total) by mouth daily. 30 minutes before lunch or dinner Glori Luis, MD 09/25/2023 1600 Active Child  polyethylene glycol powder (GLYCOLAX/MIRALAX) 17 GM/SCOOP powder [id number] No Take 17 g by mouth daily. McLean-Scocuzza, Pasty Spillers, MD Past Week Active Child  polyvinyl alcohol (LIQUIFILM TEARS) 1.4 % ophthalmic solution [id number] No Place 1 drop into both eyes at bedtime. Alberteen Sam, MD 09/25/2023 2200 Active Child  predniSONE (DELTASONE) 20 MG tablet [id number]  Take 2 tablets (40 mg total) by mouth daily with breakfast for 3 days.  Lucile Shutters, MD  Active   TRELEGY ELLIPTA 100-62.5-25 MCG/ACT AEPB [id number] No INHALE ONE PUFF DAILY. RINSE MOUTH AFTERUSE. STOP SYMBICORT & SPIRIVA. Glori Luis, MD 09/26/2023 Active Child            Home Care and Equipment/Supplies: Were Home Health Services Ordered?: Yes Name of Home Health Agency:: [facility] Has Agency set up a time to come to your home?: Yes First Home Health Visit Date: 10/02/23 Any new equipment or medical supplies ordered?: NA  Functional Questionnaire: Do you need assistance with bathing/showering or dressing?: No Do you need assistance with meal preparation?: Yes Do you need assistance with eating?: No Do you have difficulty maintaining continence: No Do you need assistance with getting out of bed/getting out of a chair/moving?: No Do you have difficulty managing or taking your medications?: No  Follow up appointments reviewed: PCP Follow-up appointment confirmed?: Yes Date of PCP follow-up appointment?: 10/15/23 Follow-up Provider: [name] Specialist [facility] Follow-up appointment confirmed?: NA Do you need transportation to your follow-up appointment?: No Do you understand care options if your condition(s) worsen?: Yes-patient verbalized understanding  SDOH Interventions Today    Flowsheet Row Most Recent Value  SDOH Interventions   Food Insecurity Interventions Intervention  Not Indicated  Transportation Interventions Intervention Not Indicated      The patient lives at home with her son. She has HHPT coming to the home and she has been referred to Palliative care for chronic COPD. Palliative through [facility] The patient's son states he has caregivers coming daily to assist her since he works full-time. Declines enrollment  Deidre Ala, RN RN Care Manager [facility] [phone]

## 2023-10-03 ENCOUNTER — Ambulatory Visit: Payer: Medicare PPO | Admitting: Family Medicine

## 2023-10-03 LAB — CULTURE, RESPIRATORY W GRAM STAIN

## 2023-10-04 ENCOUNTER — Telehealth: Payer: Self-pay

## 2023-10-04 MED ORDER — DOXYCYCLINE HYCLATE 100 MG PO TABS: 100.0000 mg | ORAL_TABLET | Freq: Two times a day (BID) | ORAL | 0 refills | Status: DC

## 2023-10-04 NOTE — Telephone Encounter (Signed)
Spoke to pt's son about culture.pt sons stated the he didn't see an increase in sob.  Pt sons then stated that her lips and eyes are swollen and think its a reaction to the medications hospital prescribed pt.  Advise pt son that pt should be seen at the ER. Son stated that pt would refuse going to er and that she would easily come into the office instead. Advise pt that if things become worse pt should go straight to the er.   Schedule pt with next available provider  Sending to pcp and Dr. Clent Ridges

## 2023-10-04 NOTE — Telephone Encounter (Signed)
Please see prior message. Given her culture grew out MRSA I am sending in doxycyline for her to take to cover for this. If she has not been evaluated for her lip swelling she needs to keep her scheduled appointment with Dr Clent Ridges.

## 2023-10-04 NOTE — Addendum Note (Signed)
Addended by: Glori Luis on: 10/04/2023 05:15 PM   Modules accepted: Orders

## 2023-10-04 NOTE — Telephone Encounter (Signed)
-----   Message from [name] sent at 10/04/2023 11:29 AM EDT ----- Please contact the patient's family.  Please let her know that her respiratory culture grew out some bacteria.  Is she still having shortness of breath or cough or fevers?  Did they schedule her follow-up with the pulmonologist when she was discharged from the hospital?

## 2023-10-05 ENCOUNTER — Encounter: Payer: Self-pay | Admitting: Family Medicine

## 2023-10-05 ENCOUNTER — Ambulatory Visit: Payer: Medicare PPO | Admitting: Family Medicine

## 2023-10-05 VITALS — BP 112/64 | HR 70 | Temp 97.9°F | Resp 16 | Ht 67.0 in | Wt 185.4 lb

## 2023-10-05 DIAGNOSIS — J432 Centrilobular emphysema: Secondary | ICD-10-CM | POA: Diagnosis not present

## 2023-10-05 DIAGNOSIS — R609 Edema, unspecified: Secondary | ICD-10-CM | POA: Diagnosis not present

## 2023-10-05 DIAGNOSIS — J15212 Pneumonia due to Methicillin resistant Staphylococcus aureus: Secondary | ICD-10-CM

## 2023-10-05 NOTE — Progress Notes (Unsigned)
SUBJECTIVE:   Chief Complaint  Patient presents with   Medication Reaction    Swollen eye and lips    HPI Presents to clinic with home worker  Patient hard of hearing.  Communication through writing tablet and caregiver.  Son joined visit midway.  Discussed the use of AI scribe software for clinical note transcription with the patient, who gave verbal consent to proceed.  History of Present Illness The patient, recently discharged from a five-day hospital stay, presents for follow-up. The hospitalization was due to a Methicillin-resistant Staphylococcus aureus (MRSA) infection, identified in a sputum culture. The patient was treated with vancomycin during the hospital stay and was discharged on doxycycline, which she has been taking twice daily.  The patient's son, who is also her primary caregiver, reported concerns about facial swelling, particularly around the eyes and lips, which was noticed after the hospital discharge. However, the patient and other caregivers did not observe this swelling. The patient denies any shortness of breath, difficulty breathing, or swelling in the neck or throat.  The patient has a history of chronic obstructive pulmonary disease (COPD) and has been on a regimen of prednisone and doxycycline. The prednisone course ended the day before the consultation. The patient's son reported that the prednisone often causes swelling and increases the patient's blood sugar levels.  The patient also has a history of urinary tract infections (UTIs), with a recent episode treated with doxycycline. The patient's son reported that the patient is prone to UTIs and that a recent culture revealed E. coli.  The patient's son also mentioned concerns about the patient's cough, which produces dark sputum. However, the patient denies any fever, chest pain, nausea, or vomiting. The patient's son reported that the patient has been sleeping well and that her blood sugar levels have been  decreasing since the cessation of prednisone.  The patient's son also expressed concerns about the risk of Clostridium difficile (C. diff) infection due to the antibiotic treatment and inquired about the use of probiotics. The patient's son also mentioned that the patient uses oxygen periodically, particularly when moving around, but her oxygen levels were satisfactory during the consultation.  In summary, the patient presents for follow-up after a recent hospitalization for a MRSA infection, with concerns about facial swelling and ongoing management of COPD and UTIs. The patient denies any respiratory distress or other significant symptoms. The patient's son, who is her primary caregiver, has been actively involved in the patient's care and expressed concerns about the side effects of the patient's medications and the risk of C. diff infection.    PERTINENT PMH / PSH: As above  OBJECTIVE:  BP 112/64   Pulse 70   Temp 97.9 F (36.6 C)   Resp 16   Ht 5\' 7"  (1.702 m)   Wt 185 lb 6 oz (84.1 kg)   LMP  (LMP Unknown)   SpO2 95%   BMI 29.03 kg/m    Physical Exam Vitals reviewed.  Constitutional:      General: She is not in acute distress.    Appearance: Normal appearance. She is normal weight. She is not ill-appearing, toxic-appearing or diaphoretic.  HENT:     Mouth/Throat:     Mouth: Mucous membranes are moist.     Pharynx: No pharyngeal swelling.  Eyes:     General:        Right eye: No discharge.        Left eye: No discharge.     Conjunctiva/sclera: Conjunctivae normal.  Cardiovascular:     Rate and Rhythm: Normal rate and regular rhythm.     Heart sounds: Normal heart sounds.  Pulmonary:     Effort: Pulmonary effort is normal.     Breath sounds: Normal breath sounds.  Abdominal:     General: Bowel sounds are normal.  Musculoskeletal:        General: Normal range of motion.  Lymphadenopathy:     Cervical: No cervical adenopathy.  Skin:    General: Skin is warm and  dry.  Neurological:     General: No focal deficit present.     Mental Status: She is alert and oriented to person, place, and time. Mental status is at baseline.  Psychiatric:        Mood and Affect: Mood normal.        Behavior: Behavior normal.        Thought Content: Thought content normal.        Judgment: Judgment normal.        09/11/2023   11:10 AM 08/14/2023    4:11 PM 07/11/2023    2:08 PM 03/28/2023    3:45 PM 01/18/2023    3:45 PM  Depression screen PHQ 2/9  Decreased Interest 0 1 0 0 0  Down, Depressed, Hopeless 0 1 0 0 0  PHQ - 2 Score 0 2 0 0 0  Altered sleeping 3 3 2  0   Tired, decreased energy 0 0 0 0   Change in appetite 0  0 0   Feeling bad or failure about yourself  0 0 0 0   Trouble concentrating 0 0 0 0   Moving slowly or fidgety/restless 0 0 0 0   Suicidal thoughts 0 0 0 0   PHQ-9 Score 3 5 2  0   Difficult doing work/chores Somewhat difficult Not difficult at all  Not difficult at all       09/11/2023   11:11 AM 08/14/2023    4:11 PM 07/11/2023    2:09 PM 03/28/2023    3:45 PM  GAD 7 : Generalized Anxiety Score  Nervous, Anxious, on Edge 0 0 0 0  Control/stop worrying 0 0 0 0  Worry too much - different things 0 0 0 0  Trouble relaxing 0 1 0 0  Restless 0 1 0 0  Easily annoyed or irritable 0 1 0 0  Afraid - awful might happen 0 0 0 0  Total GAD 7 Score 0 3 0 0  Anxiety Difficulty Somewhat difficult   Not difficult at all    ASSESSMENT/PLAN:  Edema, unspecified type Assessment & Plan: Recent hospitalization with IV fluids, possible residual fluid causing facial puffiness. No current symptoms of shortness of breath or neck swelling. No obvious signs of anaphylaxis or edema on exam and in no acute respiratory distress. -Monitor for worsening shortness of breath or decrease in oxygen saturation.   Pneumonia due to methicillin resistant Staphylococcus aureus (MRSA), unspecified laterality, unspecified part of lung (HCC) Assessment & Plan: Recent  hospitalization with IV antibiotics and now on oral Doxycycline 100mg  twice daily. Initial MRSA nasal swab negative, however sputum cultures positive.  No current symptoms of shortness of breath, fever, or chest pain. -Continue Doxycycline as prescribed. -Encourage good hygiene to prevent spread of MRSA. -Recommend Probiotics daily and continue for 14 days after antibiotic therapy to decrease risk of C Diff. -Follow up with PCP on 10/15/2023.   Centrilobular emphysema (HCC) Assessment & Plan: Chronic cough with sputum production. On  home oxygen and nebulizer treatments. -Continue Trelegy    PDMP reviewed  Return in about 10 days (around 10/15/2023) for PCP.  Dana Allan, MD

## 2023-10-05 NOTE — Telephone Encounter (Signed)
Called Patient's son Jessica Erickson regarding the culture grew MRSA. [name] confirmed they will keep the appointment with DR. Clent Ridges today.

## 2023-10-05 NOTE — Patient Instructions (Signed)
It was a pleasure meeting you today. Thank you for allowing me to take part in your health care.  Our goals for today as we discussed include:  No concern for anaphylaxis.  Continue current treatment While on antibiotics recommend Probiotics daily and continue for 14 days after therapy.    Recommend Pneumonia 20 vaccine  Follow up schedule with PCP Nov 11.  This is a list of the screening recommended for you and due dates:  Health Maintenance  Topic Date Due   Zoster (Shingles) Vaccine (1 of 2) Never done   Pneumonia Vaccine (2 of 2 - PCV) 08/30/2019   Eye exam for diabetics  12/30/2020   COVID-19 Vaccine (5 - 2023-24 season) 08/05/2023   Hemoglobin A1C  10/02/2023   DEXA scan (bone density measurement)  01/19/2024*   Medicare Annual Wellness Visit  01/19/2024   Complete foot exam   05/13/2024   Flu Shot  Completed   HPV Vaccine  Aged Out   DTaP/Tdap/Td vaccine  Discontinued  *Topic was postponed. The date shown is not the original due date.     If you have any questions or concerns, please do not hesitate to call the office at [phone].  I look forward to our next visit and until then take care and stay safe.  Regards,   Dana Allan, MD   [facility]

## 2023-10-07 ENCOUNTER — Encounter: Payer: Self-pay | Admitting: Family Medicine

## 2023-10-07 DIAGNOSIS — J449 Chronic obstructive pulmonary disease, unspecified: Secondary | ICD-10-CM | POA: Diagnosis not present

## 2023-10-07 DIAGNOSIS — J15212 Pneumonia due to Methicillin resistant Staphylococcus aureus: Secondary | ICD-10-CM | POA: Insufficient documentation

## 2023-10-07 DIAGNOSIS — R609 Edema, unspecified: Secondary | ICD-10-CM | POA: Insufficient documentation

## 2023-10-07 NOTE — Assessment & Plan Note (Signed)
Chronic cough with sputum production. On home oxygen and nebulizer treatments. -Continue Trelegy

## 2023-10-07 NOTE — Assessment & Plan Note (Signed)
Recent hospitalization with IV fluids, possible residual fluid causing facial puffiness. No current symptoms of shortness of breath or neck swelling. No obvious signs of anaphylaxis or edema on exam and in no acute respiratory distress. -Monitor for worsening shortness of breath or decrease in oxygen saturation.

## 2023-10-07 NOTE — Assessment & Plan Note (Signed)
Recent hospitalization with IV antibiotics and now on oral Doxycycline 100mg  twice daily. Initial MRSA nasal swab negative, however sputum cultures positive.  No current symptoms of shortness of breath, fever, or chest pain. -Continue Doxycycline as prescribed. -Encourage good hygiene to prevent spread of MRSA. -Recommend Probiotics daily and continue for 14 days after antibiotic therapy to decrease risk of C Diff. -Follow up with PCP on 10/15/2023.

## 2023-10-12 DIAGNOSIS — I130 Hypertensive heart and chronic kidney disease with heart failure and stage 1 through stage 4 chronic kidney disease, or unspecified chronic kidney disease: Secondary | ICD-10-CM | POA: Diagnosis not present

## 2023-10-12 DIAGNOSIS — J471 Bronchiectasis with (acute) exacerbation: Secondary | ICD-10-CM | POA: Diagnosis not present

## 2023-10-12 DIAGNOSIS — J690 Pneumonitis due to inhalation of food and vomit: Secondary | ICD-10-CM | POA: Diagnosis not present

## 2023-10-12 DIAGNOSIS — J9611 Chronic respiratory failure with hypoxia: Secondary | ICD-10-CM | POA: Diagnosis not present

## 2023-10-12 DIAGNOSIS — J441 Chronic obstructive pulmonary disease with (acute) exacerbation: Secondary | ICD-10-CM | POA: Diagnosis not present

## 2023-10-12 DIAGNOSIS — J440 Chronic obstructive pulmonary disease with acute lower respiratory infection: Secondary | ICD-10-CM | POA: Diagnosis not present

## 2023-10-12 DIAGNOSIS — J069 Acute upper respiratory infection, unspecified: Secondary | ICD-10-CM | POA: Diagnosis not present

## 2023-10-12 DIAGNOSIS — B348 Other viral infections of unspecified site: Secondary | ICD-10-CM | POA: Diagnosis not present

## 2023-10-12 DIAGNOSIS — J9811 Atelectasis: Secondary | ICD-10-CM | POA: Diagnosis not present

## 2023-10-15 ENCOUNTER — Ambulatory Visit: Payer: Medicare PPO | Admitting: Family Medicine

## 2023-10-15 ENCOUNTER — Encounter: Payer: Self-pay | Admitting: Family Medicine

## 2023-10-15 VITALS — BP 114/68 | HR 68 | Temp 98.5°F | Ht 67.0 in | Wt 180.8 lb

## 2023-10-15 DIAGNOSIS — J471 Bronchiectasis with (acute) exacerbation: Secondary | ICD-10-CM

## 2023-10-15 DIAGNOSIS — J432 Centrilobular emphysema: Secondary | ICD-10-CM

## 2023-10-15 DIAGNOSIS — J9611 Chronic respiratory failure with hypoxia: Secondary | ICD-10-CM

## 2023-10-15 DIAGNOSIS — J15212 Pneumonia due to Methicillin resistant Staphylococcus aureus: Secondary | ICD-10-CM | POA: Diagnosis not present

## 2023-10-15 DIAGNOSIS — A4902 Methicillin resistant Staphylococcus aureus infection, unspecified site: Secondary | ICD-10-CM

## 2023-10-15 DIAGNOSIS — J9612 Chronic respiratory failure with hypercapnia: Secondary | ICD-10-CM | POA: Diagnosis not present

## 2023-10-16 DIAGNOSIS — J690 Pneumonitis due to inhalation of food and vomit: Secondary | ICD-10-CM | POA: Diagnosis not present

## 2023-10-16 DIAGNOSIS — J069 Acute upper respiratory infection, unspecified: Secondary | ICD-10-CM | POA: Diagnosis not present

## 2023-10-16 DIAGNOSIS — I130 Hypertensive heart and chronic kidney disease with heart failure and stage 1 through stage 4 chronic kidney disease, or unspecified chronic kidney disease: Secondary | ICD-10-CM | POA: Diagnosis not present

## 2023-10-16 DIAGNOSIS — E1122 Type 2 diabetes mellitus with diabetic chronic kidney disease: Secondary | ICD-10-CM

## 2023-10-16 DIAGNOSIS — J441 Chronic obstructive pulmonary disease with (acute) exacerbation: Secondary | ICD-10-CM | POA: Diagnosis not present

## 2023-10-16 DIAGNOSIS — J9611 Chronic respiratory failure with hypoxia: Secondary | ICD-10-CM | POA: Diagnosis not present

## 2023-10-16 DIAGNOSIS — J440 Chronic obstructive pulmonary disease with acute lower respiratory infection: Secondary | ICD-10-CM | POA: Diagnosis not present

## 2023-10-16 DIAGNOSIS — J471 Bronchiectasis with (acute) exacerbation: Secondary | ICD-10-CM | POA: Diagnosis not present

## 2023-10-16 DIAGNOSIS — N1832 Chronic kidney disease, stage 3b: Secondary | ICD-10-CM

## 2023-10-16 DIAGNOSIS — I5032 Chronic diastolic (congestive) heart failure: Secondary | ICD-10-CM

## 2023-10-16 DIAGNOSIS — J9811 Atelectasis: Secondary | ICD-10-CM | POA: Diagnosis not present

## 2023-10-16 DIAGNOSIS — B348 Other viral infections of unspecified site: Secondary | ICD-10-CM | POA: Diagnosis not present

## 2023-10-16 NOTE — Assessment & Plan Note (Signed)
Chronic issue.  Patient will continue 2 L nasal cannula.  Patient will be referred to pulmonology.

## 2023-10-16 NOTE — Assessment & Plan Note (Signed)
Appears to have recovered back to her baseline.  She has finished her doxycycline.  I initially discussed doing a nasal swab to check for MRSA though on review of prior nasal swab documentation notes that this should not be used to check response to infection.  Advised patient's son that I would send a message to pulmonology to get their input on whether or not she needs further testing to confirm clearance of this.

## 2023-10-16 NOTE — Assessment & Plan Note (Signed)
Chronic issue.  Appears to be back to her baseline.  She can continue as needed nebulizers morning noon and bedtime.  She will also continue Trelegy 1 puff daily.  Referred to pulmonology.

## 2023-10-16 NOTE — Progress Notes (Signed)
[name], MD Phone: [phone]  Jessica Erickson is a 89 y.o. female who presents today for follow-up.  Pneumonia: Patient diagnosed with pneumonia and hospitalized.  Sputum culture revealed MRSA.  She was treated with doxycycline and has finished this.  She notes cough is at baseline.  No fevers.  Breathing is at baseline.  She does not have pulmonology follow-up.  She has been using nebulizers 3 times a day for her chronic lung issues.  She is to doing flutter valve.  Her son reports that he is lost caregivers and has family members that will not help care for the patient given that she had a positive MRSA test.  Social History   Tobacco Use  Smoking Status Former   Current packs/day: 0.00   Average packs/day: 0.5 packs/day for 20.0 years (10.0 ttl pk-yrs)   Types: Cigarettes   Start date: 09/20/1975   Quit date: 09/20/1995   Years since quitting: 28.0  Smokeless Tobacco Never  Tobacco Comments   quit 1996 smoked 20 years max 8 cig qd     Current Outpatient Medications on File Prior to Visit  Medication Sig Dispense Refill   Accu-Chek Softclix Lancets lancets Use as instructed 100 each 12   acetaminophen (TYLENOL) 325 MG tablet Take 650 mg by mouth 2 (two) times daily as needed for mild pain (pain score 1-3).     albuterol (PROVENTIL) (2.5 MG/3ML) 0.083% nebulizer solution Take 3 mLs (2.5 mg total) by nebulization in the morning, at noon, and at bedtime. 360 mL 11   apixaban (ELIQUIS) 2.5 MG TABS tablet Take 1 tablet (2.5 mg total) by mouth 2 (two) times daily. 180 tablet 3   atorvastatin (LIPITOR) 40 MG tablet Take 1 tablet (40 mg total) by mouth daily. 90 tablet 3   bisacodyl (DULCOLAX) 5 MG EC tablet Take 5 mg by mouth daily as needed for moderate constipation.     Blood Glucose Monitoring Suppl (ACCU-CHEK AVIVA PLUS) w/Device KIT Use as directed 1 kit 0   calcium-vitamin D (OSCAL WITH D) 500-5 MG-MCG tablet Take 1 tablet by mouth 2 (two) times daily. Unable to find  former dose 600ca/400 vitamin D inform patient dose change Oscal 180 tablet 3   cetirizine (ZYRTEC) 10 MG tablet Take 1 tablet (10 mg total) by mouth daily. 90 tablet 3   docusate sodium (COLACE) 100 MG capsule Take 100 mg by mouth 2 (two) times daily.     doxycycline (VIBRA-TABS) 100 MG tablet Take 1 tablet (100 mg total) by mouth 2 (two) times daily. 14 tablet 0   feeding supplement, GLUCERNA SHAKE, (GLUCERNA SHAKE) LIQD Take 237 mLs by mouth 2 (two) times daily between meals.  0   furosemide (LASIX) 40 MG tablet TAKE ONE TABLET (40MG ) DAILY. MAY TAKE ASECOND DOSE OF 40MG  IF NEEDED AT LUNCH FOR 5 DAYS 120 tablet 3   gabapentin (NEURONTIN) 100 MG capsule TAKE 2 CAPSULES BY MOUTH 3 TIMES DAILY. 504 capsule 3   glucose blood test strip Use to test blood sugar twice daily. 200 each 3   guaiFENesin (MUCINEX) 600 MG 12 hr tablet Take 2 tablets (1,200 mg total) by mouth 2 (two) times daily as needed for cough or to loosen phlegm. (Patient taking differently: Take 600 mg by mouth 2 (two) times daily as needed for cough or to loosen phlegm.)     ipratropium-albuterol (DUONEB) 0.5-2.5 (3) MG/3ML SOLN TAKE 3 ML BY NEBULIZATION EVERY 4 HOURS AS NEEDED 360 mL 11   levothyroxine (SYNTHROID)  150 MCG tablet TAKE ONE TABLET BY MOUTH EVERY DAY BEFORE BREAKFAST 90 tablet 1   Lidocaine-Menthol (ICY HOT MAX LIDOCAINE) 4-1 % CREA Apply 1 application. topically 3 (three) times daily with meals as needed. Dispense cream or spray pt preference 120 g 11   Melatonin 10 MG TABS Take 10 mg by mouth at bedtime. 90 tablet 3   mirtazapine (REMERON) 15 MG tablet Take 1 tablet (15 mg total) by mouth at bedtime. 90 tablet 3   Multiple Vitamin (MULTIVITAMIN WITH MINERALS) TABS tablet Take 1 tablet by mouth daily.      OXYGEN Inhale 2 L/min into the lungs continuous.     pantoprazole (PROTONIX) 40 MG tablet Take 1 tablet (40 mg total) by mouth daily. 30 minutes before lunch or dinner 90 tablet 3   polyethylene glycol powder  (GLYCOLAX/MIRALAX) 17 GM/SCOOP powder Take 17 g by mouth daily. 255 g 11   polyvinyl alcohol (LIQUIFILM TEARS) 1.4 % ophthalmic solution Place 1 drop into both eyes at bedtime. 15 mL 0   TRELEGY ELLIPTA 100-62.5-25 MCG/ACT AEPB INHALE ONE PUFF DAILY. RINSE MOUTH AFTERUSE. STOP SYMBICORT & SPIRIVA. 60 each 11   No current facility-administered medications on file prior to visit.     ROS see history of present illness  Objective  Physical Exam Vitals:   10/15/23 1555  BP: 114/68  Pulse: 68  Temp: 98.5 F (36.9 C)  SpO2: 93%    BP Readings from Last 3 Encounters:  10/15/23 114/68  10/05/23 112/64  10/01/23 (!) 140/70   Wt Readings from Last 3 Encounters:  10/15/23 180 lb 12.8 oz (82 kg)  10/05/23 185 lb 6 oz (84.1 kg)  10/01/23 194 lb 10.7 oz (88.3 kg)    Physical Exam Constitutional:      General: She is not in acute distress.    Appearance: She is not diaphoretic.  Cardiovascular:     Rate and Rhythm: Normal rate and regular rhythm.     Heart sounds: Normal heart sounds.  Pulmonary:     Effort: Pulmonary effort is normal.     Breath sounds: Normal breath sounds.  Skin:    General: Skin is warm and dry.  Neurological:     Mental Status: She is alert.      Assessment/Plan: Please see individual problem list.  MRSA (methicillin resistant Staphylococcus aureus) infection  Bronchiectasis with acute exacerbation (HCC) -     Ambulatory referral to Pulmonology  Chronic respiratory failure with hypoxia and hypercapnia (HCC) Assessment & Plan: Chronic issue.  Patient will continue 2 L nasal cannula.  Patient will be referred to pulmonology.  Orders: -     Ambulatory referral to Pulmonology  Centrilobular emphysema [facility]) Assessment & Plan: Chronic issue.  Appears to be back to her baseline.  She can continue as needed nebulizers morning noon and bedtime.  She will also continue Trelegy 1 puff daily.  Referred to pulmonology.  Orders: -     Ambulatory referral  to Pulmonology  Pneumonia due to methicillin resistant Staphylococcus aureus (MRSA), unspecified laterality, unspecified part of lung (HCC) Assessment & Plan: Appears to have recovered back to her baseline.  She has finished her doxycycline.  I initially discussed doing a nasal swab to check for MRSA though on review of prior nasal swab documentation notes that this should not be used to check response to infection.  Advised patient's son that I would send a message to pulmonology to get their input on whether or not she needs further  testing to confirm clearance of this.  Orders: -     Ambulatory referral to Pulmonology    Return in about 3 months (around 01/15/2024) for transfer of care.   [name], MD [facility]

## 2023-10-18 ENCOUNTER — Encounter: Payer: Self-pay | Admitting: Pulmonary Disease

## 2023-10-18 ENCOUNTER — Other Ambulatory Visit: Payer: Self-pay | Admitting: Family

## 2023-10-18 DIAGNOSIS — J471 Bronchiectasis with (acute) exacerbation: Secondary | ICD-10-CM | POA: Diagnosis not present

## 2023-10-18 DIAGNOSIS — J9811 Atelectasis: Secondary | ICD-10-CM | POA: Diagnosis not present

## 2023-10-18 DIAGNOSIS — J440 Chronic obstructive pulmonary disease with acute lower respiratory infection: Secondary | ICD-10-CM | POA: Diagnosis not present

## 2023-10-18 DIAGNOSIS — I130 Hypertensive heart and chronic kidney disease with heart failure and stage 1 through stage 4 chronic kidney disease, or unspecified chronic kidney disease: Secondary | ICD-10-CM | POA: Diagnosis not present

## 2023-10-18 DIAGNOSIS — J441 Chronic obstructive pulmonary disease with (acute) exacerbation: Secondary | ICD-10-CM | POA: Diagnosis not present

## 2023-10-18 DIAGNOSIS — J069 Acute upper respiratory infection, unspecified: Secondary | ICD-10-CM | POA: Diagnosis not present

## 2023-10-18 DIAGNOSIS — J690 Pneumonitis due to inhalation of food and vomit: Secondary | ICD-10-CM | POA: Diagnosis not present

## 2023-10-18 DIAGNOSIS — B348 Other viral infections of unspecified site: Secondary | ICD-10-CM | POA: Diagnosis not present

## 2023-10-18 DIAGNOSIS — J9611 Chronic respiratory failure with hypoxia: Secondary | ICD-10-CM | POA: Diagnosis not present

## 2023-10-22 ENCOUNTER — Telehealth: Payer: Self-pay | Admitting: Family Medicine

## 2023-10-22 ENCOUNTER — Telehealth: Payer: Self-pay

## 2023-10-22 ENCOUNTER — Ambulatory Visit: Payer: Medicare PPO | Admitting: Nurse Practitioner

## 2023-10-22 ENCOUNTER — Ambulatory Visit
Admission: EM | Admit: 2023-10-22 | Discharge: 2023-10-22 | Disposition: A | Discharge status: Home / Self Care | Payer: Medicare PPO | Attending: Physician Assistant | Admitting: Physician Assistant

## 2023-10-22 DIAGNOSIS — J439 Emphysema, unspecified: Secondary | ICD-10-CM

## 2023-10-22 DIAGNOSIS — N3000 Acute cystitis without hematuria: Secondary | ICD-10-CM | POA: Diagnosis not present

## 2023-10-22 LAB — URINALYSIS, W/ REFLEX TO CULTURE (INFECTION SUSPECTED)
Bilirubin Urine: NEGATIVE
Glucose, UA: NEGATIVE mg/dL
Ketones, ur: NEGATIVE mg/dL
Nitrite: NEGATIVE
Protein, ur: NEGATIVE mg/dL
Specific Gravity, Urine: 1.015 (ref 1.005–1.030)
pH: 6 (ref 5.0–8.0)

## 2023-10-22 MED ORDER — ALBUTEROL SULFATE (2.5 MG/3ML) 0.083% IN NEBU: 2.5000 mg | INHALATION_SOLUTION | Freq: Three times a day (TID) | RESPIRATORY_TRACT | 11 refills | Status: DC

## 2023-10-22 MED ORDER — CIPROFLOXACIN HCL 250 MG PO TABS: 250.0000 mg | ORAL_TABLET | Freq: Two times a day (BID) | ORAL | 0 refills | Status: AC

## 2023-10-22 NOTE — ED Triage Notes (Signed)
Pt c/o burning w/urination since today. Denies any hematuria.

## 2023-10-22 NOTE — Discharge Instructions (Addendum)
UTI: Based on either symptoms or urinalysis, you may have a urinary tract infection. We will send the urine for culture and call with results in a few days. Begin antibiotics at this time. Your symptoms should be much improved over the next 2-3 days. Increase rest and fluid intake. If for some reason symptoms are worsening or not improving after a couple of days or the urine culture determines the antibiotics you are taking will not treat the infection, the antibiotics may be changed. Return or go to ER for fever, back pain, worsening urinary pain, discharge, increased blood in urine. May take Tylenol or Motrin OTC for pain relief or consider AZO if no contraindications  ?

## 2023-10-22 NOTE — Telephone Encounter (Signed)
Prescription Request  10/22/2023  LOV: 10/15/2023 Patient's insurance, [employer] is requesting this rescue inhaler.  What is the name of the medication or equipment?  albuterol (PROVENTIL) (2.5 MG/3ML) 0.083% nebulizer solution   Have you contacted your pharmacy to request a refill? Yes   Which pharmacy would you like this sent to?  [facility] - [place], [place] - [address] [place] [address] Phone: [phone] Fax: [phone]   Patient notified that their request is being sent to the clinical staff for review and that they should receive a response within 2 business days.   Please advise at Mobile [phone] (mobile)

## 2023-10-22 NOTE — Telephone Encounter (Signed)
Sent to pharmacy 

## 2023-10-22 NOTE — Telephone Encounter (Signed)
noted 

## 2023-10-22 NOTE — Telephone Encounter (Signed)
Error

## 2023-10-22 NOTE — Telephone Encounter (Signed)
No show . Patient son is taking her to the ED

## 2023-10-22 NOTE — Addendum Note (Signed)
Addended by: Glori Luis on: 10/22/2023 03:51 PM   Modules accepted: Orders

## 2023-10-22 NOTE — ED Provider Notes (Signed)
[facility]    CSN: [id number] Arrival date & time: 10/22/23  1407      History   Chief Complaint Chief Complaint  Patient presents with   Dysuria    HPI DANETTE BILA is a 89 y.o. female presenting for dysuria and urinary urgency that began today. Denies fever, fatigue, abdominal pain, flank pain vomiting, or hematuria.   Recently completed doxycycline for MRSA and pneumonia. Took Levaquin last month for UTI and Macrobid before that in September.  She is allergic to penicillins and sulfa drugs.  HPI  Past Medical History:  Diagnosis Date   Acoustic neuroma (HCC)    Allergy    Asthma    Bilateral swelling of feet    and legs   Bladder infection    CAD (coronary artery disease)    Cataract    Change in voice    Compression fracture of body of thoracic vertebra (HCC)    T12 09/18/15 MRI s/p fall    Constipation    COPD (chronic obstructive pulmonary disease) (HCC)    previous CXR with chronic interstitial lung dz    CVA (cerebral vascular accident) (HCC)    Depression    Diabetes (HCC)    with neuropathy   Diabetes mellitus, type 2 (HCC)    Diarrhea    Double vision    DVT (deep venous thrombosis) (HCC)    right leg 10/2015 was on coumadin off as of 2017/2018 ; s/p IVC filter   Enuresis    Eye pain, right    Fall    Fatty liver    09/15/15 also mildly dilated pancreatitic duct rec MRCP small sub cm cyst hemangioma speeln mild right hydronephrorossi and prox. hydroureter, kidney stones, mild scarring kidneys   Female stress incontinence    Flank pain    GERD (gastroesophageal reflux disease)    with small hiatal hernia    Hard of hearing    Heart disease    History of kidney problems    Hyperlipidemia    mixed   Hypertension    Hypothyroidism, postsurgical    Impaired mobility and ADLs    uses rolling walker has caretaker 24/7 at home   Leg edema    Mixed incontinence urge and stress (female)(female)    Neuropathy    Osteoarthritis    DDD  spine    Osteoporosis with fracture    T12 compression fracture   Photophobia    Pulmonary embolism (HCC)    10/2015 off coumadin as of 04/2016   Pulmonary HTN (HCC)    mild pulm HTN, echo 10/09/15 EF 55-60%grade 1 dd, RV systolic pressure increased    Recurrent UTI    Sinus pressure    Skin cancer    BCC jawline and scalp    Thyroid disease    follows [facility]   TIA (transient ischemic attack)    MRI 2009/2010 neg stroke    Trigeminal neuralgia    Dr. Cammie Mcgee s/p gamma knife x 2, on Tegretol since 2011/2012 no increase in dose >200 mg bid rec per family per neurology    Urinary frequency    Urinary, incontinence, stress female    Dr Apolinar Junes urology     Patient Active Problem List   Diagnosis Date Noted   MRSA pneumonia (HCC) 10/07/2023   Edema 10/07/2023   Acute exacerbation of bronchiectasis (HCC) 09/26/2023   Depression, major, single episode, mild (HCC) 09/11/2023   Urination frequency 08/19/2023   SOB (shortness  of breath) 08/19/2023   Vaso vagal episode 04/02/2023   Acute respiratory failure with hypoxia and hypercapnia (HCC) 11/06/2022   COPD with acute exacerbation (HCC) 11/06/2022   Chronic heart failure with preserved ejection fraction (HFpEF) (HCC) 11/06/2022   UTI (urinary tract infection) 11/03/2022   FTT (failure to thrive) in adult 10/25/2022   Dyslipidemia 10/24/2022   Hypothyroidism 10/24/2022   GERD without esophagitis 10/24/2022   Hearing loss 09/01/2022   DNR (do not resuscitate) 08/01/2022   Abnormal gait 10/19/2021   At high risk for falls 10/14/2021   Spinal stenosis of lumbar region 09/30/2021   Pain in both lower extremities 09/30/2021   Osteoarthritis 09/30/2021   Lumbar and sacral arthritis 09/30/2021   Pure hypercholesterolemia, unspecified 09/15/2021   Hyperlipidemia, unspecified 09/15/2021   Coronary atherosclerosis 09/15/2021   Hearing decreased, bilateral 08/22/2021   Chronic respiratory failure with hypoxia and hypercapnia  (HCC) 08/14/2021   Shortness of breath 08/13/2021   CKD (chronic kidney disease), stage III (HCC) 08/13/2021   History of CVA (cerebrovascular accident) 08/13/2021   Prediabetes 06/29/2021   Pulmonary artery hypertension (HCC) 05/03/2021   Grade I diastolic dysfunction 08/16/2020   Obesity (BMI 30-39.9) 08/16/2020   Chronic kidney disease, stage 3b (HCC) 04/06/2020   Thrombocytopenia (HCC) 04/06/2020   Seborrheic keratoses 04/06/2020   Hip pain 09/30/2019   Arthritis 09/30/2019   Fall 03/11/2019   Epistaxis 12/11/2018   History of DVT (deep vein thrombosis) 11/12/2018   Bilateral lower extremity edema 06/04/2018   Hearing loss of both ears 06/04/2018   Actinic keratosis 06/04/2018   Bronchiectasis (HCC) 06/04/2018   Depression 05/17/2018   Dehydration 05/15/2018   Hypertension 05/03/2018   Lymphedema 05/02/2018   Iron deficiency anemia 05/02/2018   Presence of IVC filter 05/02/2018   Anemia 04/11/2018   Fatigue 04/11/2018   Moderate protein-calorie malnutrition (HCC) 03/22/2018   Orthostasis 03/05/2018   Dizziness 03/04/2018   COPD (chronic obstructive pulmonary disease) (HCC) 12/13/2017   Anxiety and depression 12/13/2017   Trigeminal neuralgia 12/13/2017   GERD (gastroesophageal reflux disease) 12/13/2017   Constipation 12/13/2017   Insomnia 12/13/2017   Low back pain 10/08/2015   Collapsed vertebra, not elsewhere classified, thoracic region, initial encounter for fracture (HCC) 09/20/2015   Basal cell carcinoma of scalp 04/06/2014   Fothergill's neuralgia 08/05/2013   Bladder infection, chronic 02/11/2013   Female genuine stress incontinence 02/11/2013   Incomplete bladder emptying 02/11/2013   Intrinsic sphincter deficiency 02/11/2013   Mixed incontinence 02/11/2013   Excessive urination at night 02/11/2013   Bladder retention 02/11/2013   FOM (frequency of micturition) 02/11/2013   Basal cell carcinoma of face 09/13/2011    Past Surgical History:  Procedure  Laterality Date   APPENDECTOMY     as a child, open   BRAIN SURGERY     schwnnoma removal 1996    brain tumor surgery     BREAST SURGERY     breast bx   CATARACT EXTRACTION     CHOLECYSTECTOMY     EYE SURGERY     cataract   IVC FILTER PLACEMENT ([facility] HX)     Dr. Wyn Quaker 10/2015    LAPAROSCOPIC TUBAL LIGATION     MOHS SURGERY     scalp 04/2014    PERIPHERAL VASCULAR CATHETERIZATION N/A 10/11/2015   Procedure: IVC Filter Insertion;  Surgeon: Annice Needy, MD;  Location: [facility];  Service: Cardiovascular;  Laterality: N/A;   PERIPHERAL VASCULAR THROMBECTOMY Bilateral 03/29/2018   Procedure: PERIPHERAL VASCULAR THROMBECTOMY;  Surgeon: Annice Needy, MD;  Location: [facility];  Service: Cardiovascular;  Laterality: Bilateral;   PUBOVAGINAL SLING     THROAT SURGERY     THYROID SURGERY     tumor around vocal cords    TOOTH EXTRACTION     winter 2018    TOTAL THYROIDECTOMY  1976    OB History   No obstetric history on file.      Home Medications    Prior to Admission medications   Medication Sig Start Date End Date Taking? Authorizing Provider  Accu-Chek Softclix Lancets lancets Use as instructed 10/11/22  Yes Worthy Rancher B, FNP  acetaminophen (TYLENOL) 325 MG tablet Take 650 mg by mouth 2 (two) times daily as needed for mild pain (pain score 1-3).   Yes [provider]  albuterol (PROVENTIL) (2.5 MG/3ML) 0.083% nebulizer solution Take 3 mLs (2.5 mg total) by nebulization in the morning, at noon, and at bedtime. 01/25/22  Yes McLean-Scocuzza, Pasty Spillers, MD  apixaban (ELIQUIS) 2.5 MG TABS tablet Take 1 tablet (2.5 mg total) by mouth 2 (two) times daily. 02/28/23  Yes Glori Luis, MD  atorvastatin (LIPITOR) 40 MG tablet Take 1 tablet (40 mg total) by mouth daily. 04/18/23  Yes Glori Luis, MD  bisacodyl (DULCOLAX) 5 MG EC tablet Take 5 mg by mouth daily as needed for moderate constipation.   Yes [provider]  Blood Glucose Monitoring  Suppl (ACCU-CHEK AVIVA PLUS) w/Device KIT Use as directed 09/22/21  Yes McLean-Scocuzza, Pasty Spillers, MD  calcium-vitamin D (OSCAL WITH D) 500-5 MG-MCG tablet Take 1 tablet by mouth 2 (two) times daily. Unable to find former dose 600ca/400 vitamin D inform patient dose change [place] 09/19/23  Yes Glori Luis, MD  cetirizine (ZYRTEC) 10 MG tablet Take 1 tablet (10 mg total) by mouth daily. 02/28/23  Yes Glori Luis, MD  ciprofloxacin (CIPRO) 250 MG tablet Take 1 tablet (250 mg total) by mouth every 12 (twelve) hours for 5 days. 10/22/23 10/27/23 Yes Eusebio Friendly B, PA-C  docusate sodium (COLACE) 100 MG capsule Take 100 mg by mouth 2 (two) times daily.   Yes [provider]  doxycycline (VIBRA-TABS) 100 MG tablet Take 1 tablet (100 mg total) by mouth 2 (two) times daily. 10/04/23  Yes Glori Luis, MD  feeding supplement, GLUCERNA SHAKE, (GLUCERNA SHAKE) LIQD Take 237 mLs by mouth 2 (two) times daily between meals. 08/04/21  Yes Danford, Earl Lites, MD  furosemide (LASIX) 40 MG tablet TAKE ONE TABLET (40MG ) DAILY. MAY TAKE ASECOND DOSE OF 40MG  IF NEEDED AT LUNCH FOR 5 DAYS 08/21/23  Yes Glori Luis, MD  gabapentin (NEURONTIN) 100 MG capsule TAKE 2 CAPSULES BY MOUTH 3 TIMES DAILY. 02/06/23  Yes Glori Luis, MD  glucose blood test strip Use to test blood sugar twice daily. 10/11/22  Yes Worthy Rancher B, FNP  guaiFENesin (MUCINEX) 600 MG 12 hr tablet Take 2 tablets (1,200 mg total) by mouth 2 (two) times daily as needed for cough or to loosen phlegm. Patient taking differently: Take 600 mg by mouth 2 (two) times daily as needed for cough or to loosen phlegm. 05/05/21  Yes Coralyn Helling, MD  ipratropium-albuterol (DUONEB) 0.5-2.5 (3) MG/3ML SOLN TAKE 3 ML BY NEBULIZATION EVERY 4 HOURS AS NEEDED 02/20/23  Yes Glori Luis, MD  levothyroxine (SYNTHROID) 150 MCG tablet TAKE ONE TABLET BY MOUTH EVERY DAY BEFORE BREAKFAST 08/30/23  Yes Glori Luis, MD  Lidocaine-Menthol  (ICY  HOT MAX LIDOCAINE) 4-1 % CREA Apply 1 application. topically 3 (three) times daily with meals as needed. Dispense cream or spray pt preference 02/16/22  Yes McLean-Scocuzza, Pasty Spillers, MD  Melatonin 10 MG TABS Take 10 mg by mouth at bedtime. 11/07/21  Yes McLean-Scocuzza, Pasty Spillers, MD  mirtazapine (REMERON) 15 MG tablet Take 1 tablet (15 mg total) by mouth at bedtime. 02/28/23  Yes Glori Luis, MD  Multiple Vitamin (MULTIVITAMIN WITH MINERALS) TABS tablet Take 1 tablet by mouth daily.    Yes [provider]  OXYGEN Inhale 2 L/min into the lungs continuous.   Yes [provider]  pantoprazole (PROTONIX) 40 MG tablet Take 1 tablet (40 mg total) by mouth daily. 30 minutes before lunch or dinner 02/06/23  Yes Glori Luis, MD  polyethylene glycol powder (GLYCOLAX/MIRALAX) 17 GM/SCOOP powder Take 17 g by mouth daily. 04/06/20  Yes McLean-Scocuzza, Pasty Spillers, MD  polyvinyl alcohol (LIQUIFILM TEARS) 1.4 % ophthalmic solution Place 1 drop into both eyes at bedtime. 08/04/21  Yes Danford, Earl Lites, MD  TRELEGY ELLIPTA 100-62.5-25 MCG/ACT AEPB INHALE ONE PUFF DAILY. RINSE MOUTH AFTERUSE. STOP SYMBICORT & SPIRIVA. 05/17/23  Yes Glori Luis, MD    Family History Family History  Problem Relation Age of Onset   Heart disease Mother    Diabetes Father    Cancer Daughter        breast ca x 2 s/p mastectomy     Social History Social History   Tobacco Use   Smoking status: Former    Current packs/day: 0.00    Average packs/day: 0.5 packs/day for 20.0 years (10.0 ttl pk-yrs)    Types: Cigarettes    Start date: 09/20/1975    Quit date: 09/20/1995    Years since quitting: 28.1   Smokeless tobacco: Never   Tobacco comments:    quit 1996 smoked 20 years max 8 cig qd   Vaping Use   Vaping status: Never Used  Substance Use Topics   Alcohol use: No   Drug use: No     Allergies   Penicillins, Sulfa antibiotics, Amitiza [lubiprostone], Aspirin, and Penicillin  g   Review of Systems Review of Systems  Constitutional:  Negative for chills, fatigue and fever.  Gastrointestinal:  Negative for abdominal pain, diarrhea, nausea and vomiting.  Genitourinary:  Positive for dysuria, frequency and urgency. Negative for decreased urine volume, flank pain, hematuria, pelvic pain, vaginal bleeding, vaginal discharge and vaginal pain.  Musculoskeletal:  Negative for back pain.  Skin:  Negative for rash.     Physical Exam Triage Vital Signs ED Triage Vitals  Encounter Vitals Group     BP 10/22/23 1421 135/73     Systolic BP Percentile --      Diastolic BP Percentile --      Pulse Rate 10/22/23 1421 72     Resp 10/22/23 1421 16     Temp 10/22/23 1421 98.1 F (36.7 C)     Temp Source 10/22/23 1421 Oral     SpO2 10/22/23 1421 94 %     Weight 10/22/23 1419 180 lb 12.8 oz (82 kg)     Height 10/22/23 1419 5\' 7"  (1.702 m)     Head Circumference --      Peak Flow --      Pain Score 10/22/23 1425 0     Pain Loc --      Pain Education --      Exclude from Growth Chart --  No data found.  Updated Vital Signs BP 135/73 (BP Location: Left Arm)   Pulse 72   Temp 98.1 F (36.7 C) (Oral)   Resp 16   Ht 5\' 7"  (1.702 m)   Wt 180 lb 12.8 oz (82 kg)   LMP  (LMP Unknown)   SpO2 94%   BMI 28.32 kg/m   Physical Exam Vitals and nursing note reviewed.  Constitutional:      General: She is not in acute distress.    Appearance: Normal appearance. She is not ill-appearing or toxic-appearing.  HENT:     Head: Normocephalic and atraumatic.  Eyes:     General: No scleral icterus.       Right eye: No discharge.        Left eye: No discharge.     Conjunctiva/sclera: Conjunctivae normal.  Cardiovascular:     Rate and Rhythm: Normal rate and regular rhythm.     Heart sounds: Normal heart sounds.  Pulmonary:     Effort: Pulmonary effort is normal. No respiratory distress.     Breath sounds: Normal breath sounds.  Abdominal:     Palpations: Abdomen is  soft.     Tenderness: There is no abdominal tenderness. There is no right CVA tenderness or left CVA tenderness.  Musculoskeletal:     Cervical back: Neck supple.  Skin:    General: Skin is dry.  Neurological:     General: No focal deficit present.     Mental Status: She is alert. Mental status is at baseline.     Motor: No weakness.     Gait: Gait normal.  Psychiatric:        Mood and Affect: Mood normal.      UC Treatments / Results  Labs (all labs ordered are listed, but only abnormal results are displayed) Labs Reviewed  URINALYSIS, W/ REFLEX TO CULTURE (INFECTION SUSPECTED) - Abnormal; Notable for the following components:      Result Value   Hgb urine dipstick TRACE (*)    Leukocytes,Ua TRACE (*)    Bacteria, UA FEW (*)    All other components within normal limits  URINE CULTURE    EKG   Radiology No results found.  Procedures Procedures (including critical care time)  Medications Ordered in UC Medications - No data to display  Initial Impression / Assessment and Plan / UC Course  I have reviewed the triage vital signs and the nursing notes.  Pertinent labs & imaging results that were available during my care of the patient were reviewed by me and considered in my medical decision making (see chart for details).  89 y/o female presents for dysuria, urgency/frequency x 1 day.  In the past 2 months patient has been on Macrobid, Levaquin and doxycycline.  She was treated twice for UTI and admitted to the hospital at the end of last month for pneumonia.  Vitals are normal and stable. No abdominal tenderness or CVA tenderness.   Last UTI was 08/22/23. Urine culture was resistant to ampicillin and Bactrim, intermediate to ampicillin/sulbactam.  Notably, patient has allergies to penicillin and sulfa drugs.  UA obtained.  Shows trace hemoglobin, trace leukocytes, bacteria and white blood cell clumps.  Will send urine for culture.  Likely acute cystitis.  Taking  into account patient's recent antibiotic use and allergies I sent Cipro to pharmacy for suspected UTI. Reviewed supportive care with increasing rest and fluids. Will amend treatment based on culture if needed.   Final Clinical Impressions(s) /  UC Diagnoses   Final diagnoses:  Acute cystitis without hematuria     Discharge Instructions      UTI: Based on either symptoms or urinalysis, you may have a urinary tract infection. We will send the urine for culture and call with results in a few days. Begin antibiotics at this time. Your symptoms should be much improved over the next 2-3 days. Increase rest and fluid intake. If for some reason symptoms are worsening or not improving after a couple of days or the urine culture determines the antibiotics you are taking will not treat the infection, the antibiotics may be changed. Return or go to ER for fever, back pain, worsening urinary pain, discharge, increased blood in urine. May take Tylenol or Motrin OTC for pain relief or consider AZO if no contraindications      ED Prescriptions     Medication Sig Dispense Auth. Provider   ciprofloxacin (CIPRO) 250 MG tablet Take 1 tablet (250 mg total) by mouth every 12 (twelve) hours for 5 days. 10 tablet Gareth Morgan      PDMP not reviewed this encounter.   Shirlee Latch, PA-C 10/22/23 1511

## 2023-10-23 ENCOUNTER — Telehealth: Payer: Self-pay | Admitting: Family Medicine

## 2023-10-23 NOTE — Telephone Encounter (Signed)
Spoke to Patient's son Jessica Erickson with the information from Pulmonology. Jessica Erickson states okay thank you!

## 2023-10-23 NOTE — Telephone Encounter (Signed)
Please let the patient's son know that I heard back from pulmonology.  They would not recommend collecting additional sputum.  They recommended following good hygiene as the reasonable way to prevent spread of MRSA if the patient is colonized with this.

## 2023-10-23 NOTE — Telephone Encounter (Signed)
-----   Message from Sarina Ser sent at 10/22/2023  4:35 PM EST ----- Hi Brock Mokry,  No, we would not collect sputum unless she would have an exacerbation.  She will likely be colonized.  Following good hygiene should be reasonable.  Diamond Nickel ----- Message ----- From: Glori Luis, MD Sent: 10/16/2023   1:24 PM EST To: Salena Saner, MD  Hi Dr Jayme Cloud,   I have referred this patient to your office for follow-up on COPD/bronchiectasis. She was recently hospitalized for pneumonia and her sputum culture grew MRSA. She has completed doxycycline for this and her pulmonary symptoms are back to her baseline. Her family is concerned about being exposed to her given this infection. Is there any role for rechecking her sputum following treatment? Thanks for your help.  Minerva Areola

## 2023-10-24 ENCOUNTER — Other Ambulatory Visit: Payer: Self-pay

## 2023-10-24 ENCOUNTER — Telehealth: Payer: Self-pay

## 2023-10-24 DIAGNOSIS — J690 Pneumonitis due to inhalation of food and vomit: Secondary | ICD-10-CM | POA: Diagnosis not present

## 2023-10-24 DIAGNOSIS — J440 Chronic obstructive pulmonary disease with acute lower respiratory infection: Secondary | ICD-10-CM | POA: Diagnosis not present

## 2023-10-24 DIAGNOSIS — J9611 Chronic respiratory failure with hypoxia: Secondary | ICD-10-CM | POA: Diagnosis not present

## 2023-10-24 DIAGNOSIS — J069 Acute upper respiratory infection, unspecified: Secondary | ICD-10-CM | POA: Diagnosis not present

## 2023-10-24 DIAGNOSIS — B348 Other viral infections of unspecified site: Secondary | ICD-10-CM | POA: Diagnosis not present

## 2023-10-24 DIAGNOSIS — I130 Hypertensive heart and chronic kidney disease with heart failure and stage 1 through stage 4 chronic kidney disease, or unspecified chronic kidney disease: Secondary | ICD-10-CM | POA: Diagnosis not present

## 2023-10-24 DIAGNOSIS — J441 Chronic obstructive pulmonary disease with (acute) exacerbation: Secondary | ICD-10-CM | POA: Diagnosis not present

## 2023-10-24 DIAGNOSIS — J471 Bronchiectasis with (acute) exacerbation: Secondary | ICD-10-CM | POA: Diagnosis not present

## 2023-10-24 DIAGNOSIS — J9811 Atelectasis: Secondary | ICD-10-CM | POA: Diagnosis not present

## 2023-10-24 LAB — URINE CULTURE: Culture: <10000 — AB

## 2023-10-24 MED ORDER — GLUCOSE BLOOD VI STRP: ORAL_STRIP | 1 refills | Status: DC

## 2023-10-24 NOTE — Telephone Encounter (Signed)
Spoke to Patient's daughter Jessica Erickson and let her know to only use the ipratropium-albuterol for rescue. Keep the regular albuterol in case it is needed moving forward. Jessica Erickson understands and is agreeable to use only the ipratropium- albuterol.

## 2023-10-24 NOTE — Telephone Encounter (Signed)
Pt went to ER

## 2023-10-24 NOTE — Telephone Encounter (Signed)
Prescription Request  10/24/2023  LOV: Visit date not found  What is the name of the medication or equipment? Test strips  Have you contacted your pharmacy to request a refill? No   Which pharmacy would you like this sent to? [facility]   Patient notified that their request is being sent to the clinical staff for review and that they should receive a response within 2 business days.   Please advise at Mobile [phone] (mobile)

## 2023-10-24 NOTE — Telephone Encounter (Signed)
Note reviewed.  It looks like the patient is supposed to be on the ipratropium-albuterol for rescue nebulization though it looks like the albuterol is what was requested by the pharmacy and was subsequently refilled.  She does not need to use the ipratropium-albuterol and the albuterol nebulizer solution.  They could hold onto the albuterol nebulizer solution in case we need that moving forward.  They do not need to get any further refills of the albuterol.  Please remove this from her chart.  I apologize that the incorrect medication was refilled.  Thanks.

## 2023-10-24 NOTE — Telephone Encounter (Signed)
Patient's daughter, Dayton Martes, called to state patient is on an aerosol nebulizer.  Harriett Sine states her brother picked up a different nebulizer yesterday.  Harriett Sine states one of the nebulizers may be intended to be used as a rescue option.  Harriett Sine states both of the nebulizers contain albuterol.  Harriett Sine states one of the nebulizers includes ipratropium-albuterol.  Harriett Sine states her concern is that both contain albuterol and she is wondering if this is correct.  Harriett Sine states the pharmacist suggested they contact us for clarification.  Harriett Sine states they are not clear about what they are supposed to be doing with regard to these nebulizers and would like for [place] to please call them to clarify.  Harriett Sine states they are not opening it yet, they will wait until they hear from [place].

## 2023-10-30 DIAGNOSIS — J471 Bronchiectasis with (acute) exacerbation: Secondary | ICD-10-CM | POA: Diagnosis not present

## 2023-10-30 DIAGNOSIS — J9611 Chronic respiratory failure with hypoxia: Secondary | ICD-10-CM | POA: Diagnosis not present

## 2023-10-30 DIAGNOSIS — J441 Chronic obstructive pulmonary disease with (acute) exacerbation: Secondary | ICD-10-CM | POA: Diagnosis not present

## 2023-10-30 DIAGNOSIS — J440 Chronic obstructive pulmonary disease with acute lower respiratory infection: Secondary | ICD-10-CM | POA: Diagnosis not present

## 2023-10-30 DIAGNOSIS — J690 Pneumonitis due to inhalation of food and vomit: Secondary | ICD-10-CM | POA: Diagnosis not present

## 2023-10-30 DIAGNOSIS — J9811 Atelectasis: Secondary | ICD-10-CM | POA: Diagnosis not present

## 2023-10-30 DIAGNOSIS — J069 Acute upper respiratory infection, unspecified: Secondary | ICD-10-CM | POA: Diagnosis not present

## 2023-10-30 DIAGNOSIS — B348 Other viral infections of unspecified site: Secondary | ICD-10-CM | POA: Diagnosis not present

## 2023-10-30 DIAGNOSIS — I130 Hypertensive heart and chronic kidney disease with heart failure and stage 1 through stage 4 chronic kidney disease, or unspecified chronic kidney disease: Secondary | ICD-10-CM | POA: Diagnosis not present

## 2023-11-06 DIAGNOSIS — J449 Chronic obstructive pulmonary disease, unspecified: Secondary | ICD-10-CM | POA: Diagnosis not present

## 2023-11-06 DIAGNOSIS — J690 Pneumonitis due to inhalation of food and vomit: Secondary | ICD-10-CM | POA: Diagnosis not present

## 2023-11-06 DIAGNOSIS — B348 Other viral infections of unspecified site: Secondary | ICD-10-CM | POA: Diagnosis not present

## 2023-11-06 DIAGNOSIS — J069 Acute upper respiratory infection, unspecified: Secondary | ICD-10-CM | POA: Diagnosis not present

## 2023-11-06 DIAGNOSIS — J9811 Atelectasis: Secondary | ICD-10-CM | POA: Diagnosis not present

## 2023-11-06 DIAGNOSIS — J9611 Chronic respiratory failure with hypoxia: Secondary | ICD-10-CM | POA: Diagnosis not present

## 2023-11-06 DIAGNOSIS — J440 Chronic obstructive pulmonary disease with acute lower respiratory infection: Secondary | ICD-10-CM | POA: Diagnosis not present

## 2023-11-06 DIAGNOSIS — J471 Bronchiectasis with (acute) exacerbation: Secondary | ICD-10-CM | POA: Diagnosis not present

## 2023-11-06 DIAGNOSIS — J441 Chronic obstructive pulmonary disease with (acute) exacerbation: Secondary | ICD-10-CM | POA: Diagnosis not present

## 2023-11-06 DIAGNOSIS — I130 Hypertensive heart and chronic kidney disease with heart failure and stage 1 through stage 4 chronic kidney disease, or unspecified chronic kidney disease: Secondary | ICD-10-CM | POA: Diagnosis not present

## 2023-11-12 DIAGNOSIS — J471 Bronchiectasis with (acute) exacerbation: Secondary | ICD-10-CM | POA: Diagnosis not present

## 2023-11-12 DIAGNOSIS — J440 Chronic obstructive pulmonary disease with acute lower respiratory infection: Secondary | ICD-10-CM | POA: Diagnosis not present

## 2023-11-12 DIAGNOSIS — J9611 Chronic respiratory failure with hypoxia: Secondary | ICD-10-CM | POA: Diagnosis not present

## 2023-11-12 DIAGNOSIS — J9811 Atelectasis: Secondary | ICD-10-CM | POA: Diagnosis not present

## 2023-11-12 DIAGNOSIS — J069 Acute upper respiratory infection, unspecified: Secondary | ICD-10-CM | POA: Diagnosis not present

## 2023-11-12 DIAGNOSIS — J441 Chronic obstructive pulmonary disease with (acute) exacerbation: Secondary | ICD-10-CM | POA: Diagnosis not present

## 2023-11-12 DIAGNOSIS — J690 Pneumonitis due to inhalation of food and vomit: Secondary | ICD-10-CM | POA: Diagnosis not present

## 2023-11-12 DIAGNOSIS — B348 Other viral infections of unspecified site: Secondary | ICD-10-CM | POA: Diagnosis not present

## 2023-11-12 DIAGNOSIS — I130 Hypertensive heart and chronic kidney disease with heart failure and stage 1 through stage 4 chronic kidney disease, or unspecified chronic kidney disease: Secondary | ICD-10-CM | POA: Diagnosis not present

## 2023-11-23 ENCOUNTER — Ambulatory Visit: Payer: Medicare PPO | Admitting: Podiatry

## 2023-11-23 DIAGNOSIS — M79676 Pain in unspecified toe(s): Secondary | ICD-10-CM | POA: Diagnosis not present

## 2023-11-23 DIAGNOSIS — B351 Tinea unguium: Secondary | ICD-10-CM | POA: Diagnosis not present

## 2023-11-23 DIAGNOSIS — E1140 Type 2 diabetes mellitus with diabetic neuropathy, unspecified: Secondary | ICD-10-CM | POA: Diagnosis not present

## 2023-11-23 DIAGNOSIS — L84 Corns and callosities: Secondary | ICD-10-CM

## 2023-11-30 ENCOUNTER — Other Ambulatory Visit: Payer: Self-pay | Admitting: Family Medicine

## 2023-11-30 DIAGNOSIS — K219 Gastro-esophageal reflux disease without esophagitis: Secondary | ICD-10-CM

## 2023-12-02 ENCOUNTER — Encounter: Payer: Self-pay | Admitting: Podiatry

## 2023-12-02 NOTE — Progress Notes (Signed)
Subjective:  Patient ID: Jessica Erickson, female    DOB: 06/02/1926,  MRN: [id number]  89 y.o. female presents at risk foot care with history of diabetic neuropathy and painful elongated mycotic toenails 1-5 bilaterally which are tender when wearing enclosed shoe gear. Pain is relieved with periodic professional debridement.  New problem(s): None   PCP is Glori Luis, MD , and last visit was October 15, 2023.  Allergies  Allergen Reactions   Penicillins Shortness Of Breath, Rash and Other (See Comments)    Has patient had a PCN reaction causing immediate rash, facial/tongue/throat swelling, SOB or lightheadedness with hypotension: Yes Has patient had a PCN reaction causing severe rash involving mucus membranes or skin necrosis: No Has patient had a PCN reaction that required hospitalization No Has patient had a PCN reaction occurring within the last 10 years: No If all of the above answers are "NO", then may proceed with Cephalosporin use.   Sulfa Antibiotics Shortness Of Breath, Rash and Other (See Comments)   Amitiza [Lubiprostone]     N/v/d   Aspirin Other (See Comments)    Reaction:  Unknown  Other reaction(s): Bleeding (intolerance) Per patient " causes nose to bleed" Can take 81 mg daily without any complications Other reaction(s): "bloody nose"    Penicillin G Other (See Comments)    Has patient had a PCN reaction causing immediate rash, facial/tongue/throat swelling, SOB or lightheadedness with hypotension: No Has patient had a PCN reaction causing severe rash involving mucus membranes or skin necrosis: Unknown Has patient had a PCN reaction that required hospitalization: Unknown Has patient had a PCN reaction occurring within the last 10 years: Unknown If all of the above answers are "NO", then may proceed with Cephalosporin use.    Review of Systems: Negative except as noted in the HPI.   Objective:  Jessica Erickson is a pleasant 89 y.o. female in NAD. AAO x  3.  Vascular Examination: Vascular status intact b/l with palpable pedal pulses. CFT immediate b/l. Pedal hair present. No edema. No pain with calf compression b/l. Skin temperature gradient WNL b/l. No varicosities noted. No cyanosis or clubbing noted.  Neurological Examination: Sensation grossly intact b/l with 10 gram monofilament. Vibratory sensation intact b/l.  Dermatological Examination: Pedal skin with normal turgor, texture and tone b/l. No open wounds nor interdigital macerations noted. Toenails 1-5 b/l thick, discolored, elongated with subungual debris and pain on dorsal palpation.   Hyperkeratotic lesion(s) 1st metatarsal head right foot.  No erythema, no edema, no drainage, no fluctuance.  Musculoskeletal Examination: Muscle strength 5/5 to b/l LE.  No pain, crepitus noted b/l. No gross pedal deformities. Patient ambulates independently without assistive aids.   Radiographs: None Last A1c:      Latest Ref Rng & Units 04/02/2023    2:58 PM  Hemoglobin A1C  Hemoglobin-A1c 4.6 - 6.5 % 6.0      Assessment:   1. Pain due to onychomycosis of toenail   2. Callus   3. Type 2 diabetes mellitus with diabetic neuropathy, without long-term current use of insulin (HCC)    Plan:  -Patient was evaluated today. All questions/concerns addressed on today's visit. -Continue foot and shoe inspections daily. Monitor blood glucose per PCP/Endocrinologist's recommendations. -Toenails 1-5 b/l were debrided in length and girth with sterile nail nippers and dremel without iatrogenic bleeding.  -Callus(es) 1st metatarsal head right foot pared utilizing sterile scalpel blade without complication or incident. Total number debrided =1. -Patient/POA to call should there be question/concern  in the interim.  Return in about 9 weeks (around 01/25/2024).  [name], DPM      [place] LOCATION: [address], [place] [address]                    Office [phone]   [facility] LOCATION: [address] [place], [place] [address] Office [phone]

## 2023-12-05 ENCOUNTER — Encounter: Payer: Self-pay | Admitting: Oncology

## 2023-12-05 ENCOUNTER — Ambulatory Visit
Admission: RE | Admit: 2023-12-05 | Discharge: 2023-12-05 | Disposition: A | Discharge status: Home / Self Care | Payer: Medicare PPO | Source: Ambulatory Visit | Attending: Emergency Medicine | Admitting: Emergency Medicine

## 2023-12-05 VITALS — BP 137/80 | HR 66 | Temp 98.2°F | Resp 18

## 2023-12-05 DIAGNOSIS — R350 Frequency of micturition: Secondary | ICD-10-CM | POA: Diagnosis not present

## 2023-12-05 DIAGNOSIS — R8290 Unspecified abnormal findings in urine: Secondary | ICD-10-CM | POA: Diagnosis not present

## 2023-12-05 LAB — POCT URINALYSIS DIP (MANUAL ENTRY)
Bilirubin, UA: NEGATIVE
Glucose, UA: NEGATIVE mg/dL
Ketones, POC UA: NEGATIVE mg/dL
Nitrite, UA: NEGATIVE
Protein Ur, POC: NEGATIVE mg/dL
Spec Grav, UA: 1.015 (ref 1.010–1.025)
Urobilinogen, UA: 0.2 U/dL
pH, UA: 6 (ref 5.0–8.0)

## 2023-12-05 MED ORDER — CEPHALEXIN 500 MG PO CAPS: 500.0000 mg | ORAL_CAPSULE | Freq: Two times a day (BID) | ORAL | 0 refills | Status: AC

## 2023-12-05 MED ORDER — CEPHALEXIN 500 MG PO CAPS: 500.0000 mg | ORAL_CAPSULE | Freq: Two times a day (BID) | ORAL | 0 refills | Status: DC

## 2023-12-05 NOTE — ED Triage Notes (Signed)
 Patient to Urgent Care with complaints of urinary frequency/ foul smelling urine.  Symptoms started yesterday. Denies any known fevers.

## 2023-12-05 NOTE — Discharge Instructions (Addendum)
 Take the antibiotic as directed.  The urine culture is pending.  We will call you if it shows the need to change or discontinue your antibiotic.    Follow up with your primary care provider if your symptoms are not improving.

## 2023-12-05 NOTE — ED Provider Notes (Signed)
 Jessica Erickson    CSN: [id number] Arrival date & time: 12/05/23  1155      History   Chief Complaint Chief Complaint  Patient presents with   Urinary Frequency    HPI Jessica Erickson is a 89 y.o. female.  Accompanied by her caregiver, patient presents with day history of urinary frequency and malodorous urine.  No fever, hematuria, abdominal pain, flank pain.  The history is provided by a caregiver, the patient and medical records.    Past Medical History:  Diagnosis Date   Acoustic neuroma (HCC)    Allergy    Asthma    Bilateral swelling of feet    and legs   Bladder infection    CAD (coronary artery disease)    Cataract    Change in voice    Compression fracture of body of thoracic vertebra (HCC)    T12 09/18/15 MRI s/p fall    Constipation    COPD (chronic obstructive pulmonary disease) (HCC)    previous CXR with chronic interstitial lung dz    CVA (cerebral vascular accident) (HCC)    Depression    Diabetes (HCC)    with neuropathy   Diabetes mellitus, type 2 (HCC)    Diarrhea    Double vision    DVT (deep venous thrombosis) (HCC)    right leg 10/2015 was on coumadin  off as of 2017/2018 ; s/p IVC filter   Enuresis    Eye pain, right    Fall    Fatty liver    09/15/15 also mildly dilated pancreatitic duct rec MRCP small sub cm cyst hemangioma speeln mild right hydronephrorossi and prox. hydroureter, kidney stones, mild scarring kidneys   Female stress incontinence    Flank pain    GERD (gastroesophageal reflux disease)    with small hiatal hernia    Hard of hearing    Heart disease    History of kidney problems    Hyperlipidemia    mixed   Hypertension    Hypothyroidism, postsurgical    Impaired mobility and ADLs    uses rolling walker has caretaker 24/7 at home   Leg edema    Mixed incontinence urge and stress (female)(female)    Neuropathy    Osteoarthritis    DDD spine    Osteoporosis with fracture    T12 compression fracture    Photophobia    Pulmonary embolism (HCC)    10/2015 off coumadin  as of 04/2016   Pulmonary HTN (HCC)    mild pulm HTN, echo 10/09/15 EF 55-60%grade 1 dd, RV systolic pressure increased    Recurrent UTI    Sinus pressure    Skin cancer    BCC jawline and scalp    Thyroid  disease    follows [facility]   TIA (transient ischemic attack)    MRI 2009/2010 neg stroke    Trigeminal neuralgia    Dr. Nancye Meeter s/p gamma knife x 2, on Tegretol  since 2011/2012 no increase in dose >200 mg bid rec per family per neurology    Urinary frequency    Urinary, incontinence, stress female    Dr Penne urology     Patient Active Problem List   Diagnosis Date Noted   MRSA pneumonia (HCC) 10/07/2023   Edema 10/07/2023   Acute exacerbation of bronchiectasis (HCC) 09/26/2023   Depression, major, single episode, mild (HCC) 09/11/2023   Urination frequency 08/19/2023   SOB (shortness of breath) 08/19/2023   Vaso vagal episode 04/02/2023  Acute respiratory failure with hypoxia and hypercapnia (HCC) 11/06/2022   COPD with acute exacerbation (HCC) 11/06/2022   Chronic heart failure with preserved ejection fraction (HFpEF) (HCC) 11/06/2022   UTI (urinary tract infection) 11/03/2022   FTT (failure to thrive) in adult 10/25/2022   Dyslipidemia 10/24/2022   Hypothyroidism 10/24/2022   GERD without esophagitis 10/24/2022   Hearing loss 09/01/2022   DNR (do not resuscitate) 08/01/2022   Abnormal gait 10/19/2021   At high risk for falls 10/14/2021   Spinal stenosis of lumbar region 09/30/2021   Pain in both lower extremities 09/30/2021   Osteoarthritis 09/30/2021   Lumbar and sacral arthritis 09/30/2021   Pure hypercholesterolemia, unspecified 09/15/2021   Hyperlipidemia, unspecified 09/15/2021   Coronary atherosclerosis 09/15/2021   Hearing decreased, bilateral 08/22/2021   Chronic respiratory failure with hypoxia and hypercapnia (HCC) 08/14/2021   Shortness of breath 08/13/2021   CKD (chronic  kidney disease), stage III (HCC) 08/13/2021   History of CVA (cerebrovascular accident) 08/13/2021   Prediabetes 06/29/2021   Pulmonary artery hypertension (HCC) 05/03/2021   Grade I diastolic dysfunction 08/16/2020   Obesity (BMI 30-39.9) 08/16/2020   Chronic kidney disease, stage 3b (HCC) 04/06/2020   Thrombocytopenia (HCC) 04/06/2020   Seborrheic keratoses 04/06/2020   Hip pain 09/30/2019   Arthritis 09/30/2019   Fall 03/11/2019   Epistaxis 12/11/2018   History of DVT (deep vein thrombosis) 11/12/2018   Bilateral lower extremity edema 06/04/2018   Hearing loss of both ears 06/04/2018   Actinic keratosis 06/04/2018   Bronchiectasis (HCC) 06/04/2018   Depression 05/17/2018   Dehydration 05/15/2018   Hypertension 05/03/2018   Lymphedema 05/02/2018   Iron deficiency anemia 05/02/2018   Presence of IVC filter 05/02/2018   Anemia 04/11/2018   Fatigue 04/11/2018   Moderate protein-calorie malnutrition (HCC) 03/22/2018   Orthostasis 03/05/2018   Dizziness 03/04/2018   COPD (chronic obstructive pulmonary disease) (HCC) 12/13/2017   Anxiety and depression 12/13/2017   Trigeminal neuralgia 12/13/2017   GERD (gastroesophageal reflux disease) 12/13/2017   Constipation 12/13/2017   Insomnia 12/13/2017   Low back pain 10/08/2015   Collapsed vertebra, not elsewhere classified, thoracic region, initial encounter for fracture (HCC) 09/20/2015   Basal cell carcinoma of scalp 04/06/2014   Fothergill's neuralgia 08/05/2013   Bladder infection, chronic 02/11/2013   Female genuine stress incontinence 02/11/2013   Incomplete bladder emptying 02/11/2013   Intrinsic sphincter deficiency 02/11/2013   Mixed incontinence 02/11/2013   Excessive urination at night 02/11/2013   Bladder retention 02/11/2013   FOM (frequency of micturition) 02/11/2013   Basal cell carcinoma of face 09/13/2011    Past Surgical History:  Procedure Laterality Date   APPENDECTOMY     as a child, open   BRAIN  SURGERY     schwnnoma removal 1996    brain tumor surgery     BREAST SURGERY     breast bx   CATARACT EXTRACTION     CHOLECYSTECTOMY     EYE SURGERY     cataract   IVC FILTER PLACEMENT ([facility] HX)     Dr. Marea 10/2015    LAPAROSCOPIC TUBAL LIGATION     MOHS SURGERY     scalp 04/2014    PERIPHERAL VASCULAR CATHETERIZATION N/A 10/11/2015   Procedure: IVC Filter Insertion;  Surgeon: Selinda GORMAN Marea, MD;  Location: [facility] INVASIVE CV LAB;  Service: Cardiovascular;  Laterality: N/A;   PERIPHERAL VASCULAR THROMBECTOMY Bilateral 03/29/2018   Procedure: PERIPHERAL VASCULAR THROMBECTOMY;  Surgeon: Marea Selinda GORMAN, MD;  Location: [facility] INVASIVE CV  LAB;  Service: Cardiovascular;  Laterality: Bilateral;   PUBOVAGINAL SLING     THROAT SURGERY     THYROID  SURGERY     tumor around vocal cords    TOOTH EXTRACTION     winter 2018    TOTAL THYROIDECTOMY  1976    OB History   No obstetric history on file.      Home Medications    Prior to Admission medications   Medication Sig Start Date End Date Taking? Authorizing Provider  Accu-Chek Softclix Lancets lancets Use as instructed 10/11/22   Webb, Padonda B, FNP  acetaminophen  (TYLENOL ) 325 MG tablet Take 650 mg by mouth 2 (two) times daily as needed for mild pain (pain score 1-3).    [provider]  apixaban  (ELIQUIS ) 2.5 MG TABS tablet Take 1 tablet (2.5 mg total) by mouth 2 (two) times daily. 02/28/23   Maribeth Camellia MATSU, MD  atorvastatin  (LIPITOR) 40 MG tablet Take 1 tablet (40 mg total) by mouth daily. 04/18/23   Maribeth Camellia MATSU, MD  bisacodyl  (DULCOLAX) 5 MG EC tablet Take 5 mg by mouth daily as needed for moderate constipation.    [provider]  Blood Glucose Monitoring Suppl (ACCU-CHEK AVIVA PLUS) w/Device KIT Use as directed 09/22/21   McLean-Scocuzza, Randine SAILOR, MD  calcium -vitamin D  (OSCAL WITH D) 500-5 MG-MCG tablet Take 1 tablet by mouth 2 (two) times daily. Unable to find former dose 600ca/400 vitamin D  inform patient dose  change O[place] 09/19/23   Maribeth Camellia MATSU, MD  cephALEXin  (KEFLEX ) 500 MG capsule Take 1 capsule (500 mg total) by mouth 2 (two) times daily for 5 days. 12/05/23 12/10/23  Corlis Burnard DEL, NP  cetirizine  (ZYRTEC ) 10 MG tablet Take 1 tablet (10 mg total) by mouth daily. 02/28/23   Maribeth Camellia MATSU, MD  docusate sodium  (COLACE) 100 MG capsule Take 100 mg by mouth 2 (two) times daily.    [provider]  doxycycline  (VIBRA -TABS) 100 MG tablet Take 1 tablet (100 mg total) by mouth 2 (two) times daily. 10/04/23   Maribeth Camellia MATSU, MD  feeding supplement, GLUCERNA SHAKE, (GLUCERNA SHAKE) LIQD Take 237 mLs by mouth 2 (two) times daily between meals. 08/04/21   Danford, Lonni SQUIBB, MD  furosemide  (LASIX ) 40 MG tablet TAKE ONE TABLET (40MG ) DAILY. MAY TAKE ASECOND DOSE OF 40MG  IF NEEDED AT LUNCH FOR 5 DAYS 08/21/23   Maribeth Camellia MATSU, MD  gabapentin  (NEURONTIN ) 100 MG capsule TAKE 2 CAPSULES BY MOUTH 3 TIMES DAILY. 02/06/23   Maribeth Camellia MATSU, MD  glucose blood test strip Use to test blood sugar twice daily. 10/24/23   Maribeth Camellia MATSU, MD  guaiFENesin  (MUCINEX ) 600 MG 12 hr tablet Take 2 tablets (1,200 mg total) by mouth 2 (two) times daily as needed for cough or to loosen phlegm. Patient taking differently: Take 600 mg by mouth 2 (two) times daily as needed for cough or to loosen phlegm. 05/05/21   Sood, Vineet, MD  ipratropium-albuterol  (DUONEB) 0.5-2.5 (3) MG/3ML SOLN TAKE 3 ML BY NEBULIZATION EVERY 4 HOURS AS NEEDED 02/20/23   Maribeth Camellia MATSU, MD  levothyroxine  (SYNTHROID ) 150 MCG tablet TAKE ONE TABLET BY MOUTH EVERY DAY BEFORE BREAKFAST 08/30/23   Maribeth Camellia MATSU, MD  Lidocaine -Menthol (ICY HOT MAX LIDOCAINE ) 4-1 % CREA Apply 1 application. topically 3 (three) times daily with meals as needed. Dispense cream or spray pt preference 02/16/22   McLean-Scocuzza, Randine SAILOR, MD  Melatonin 10 MG TABS Take 10 mg by mouth at  bedtime. 11/07/21   McLean-Scocuzza, Randine SAILOR, MD  mirtazapine  (REMERON ) 15 MG  tablet Take 1 tablet (15 mg total) by mouth at bedtime. 02/28/23   Maribeth Camellia MATSU, MD  Multiple Vitamin (MULTIVITAMIN WITH MINERALS) TABS tablet Take 1 tablet by mouth daily.     [provider]  OXYGEN  Inhale 2 L/min into the lungs continuous.    [provider]  pantoprazole  (PROTONIX ) 40 MG tablet TAKE ONE TABLET BY MOUTH DAILY 30 MINUTES BEFORE LUNCH OR DINNER 11/30/23   Maribeth Camellia MATSU, MD  polyethylene glycol powder (GLYCOLAX /MIRALAX ) 17 GM/SCOOP powder Take 17 g by mouth daily. 04/06/20   McLean-Scocuzza, Randine SAILOR, MD  polyvinyl alcohol  (LIQUIFILM TEARS) 1.4 % ophthalmic solution Place 1 drop into both eyes at bedtime. 08/04/21   Danford, Lonni SQUIBB, MD  TRELEGY ELLIPTA  100-62.5-25 MCG/ACT AEPB INHALE ONE PUFF DAILY. RINSE MOUTH AFTERUSE. STOP SYMBICORT  & SPIRIVA . 05/17/23   Maribeth Camellia MATSU, MD    Family History Family History  Problem Relation Age of Onset   Heart disease Mother    Diabetes Father    Cancer Daughter        breast ca x 2 s/p mastectomy     Social History Social History   Tobacco Use   Smoking status: Former    Current packs/day: 0.00    Average packs/day: 0.5 packs/day for 20.0 years (10.0 ttl pk-yrs)    Types: Cigarettes    Start date: 09/20/1975    Quit date: 09/20/1995    Years since quitting: 28.2   Smokeless tobacco: Never   Tobacco comments:    quit 1996 smoked 20 years max 8 cig qd   Vaping Use   Vaping status: Never Used  Substance Use Topics   Alcohol  use: No   Drug use: No     Allergies   Penicillins, Sulfa antibiotics, Amitiza [lubiprostone], Aspirin , and Penicillin g   Review of Systems Review of Systems  Constitutional:  Negative for chills and fever.  Gastrointestinal:  Negative for abdominal pain.  Genitourinary:  Positive for frequency. Negative for dysuria, flank pain and hematuria.     Physical Exam Triage Vital Signs ED Triage Vitals [12/05/23 1215]  Encounter Vitals Group     BP      Systolic  BP Percentile      Diastolic BP Percentile      Pulse Rate 66     Resp 18     Temp 98.2 F (36.8 C)     Temp src      SpO2 93 %     Weight      Height      Head Circumference      Peak Flow      Pain Score      Pain Loc      Pain Education      Exclude from Growth Chart    No data found.  Updated Vital Signs BP 137/80   Pulse 66   Temp 98.2 F (36.8 C)   Resp 18   LMP  (LMP Unknown)   SpO2 93%   Visual Acuity Right Eye Distance:   Left Eye Distance:   Bilateral Distance:    Right Eye Near:   Left Eye Near:    Bilateral Near:     Physical Exam Constitutional:      General: She is not in acute distress. HENT:     Mouth/Throat:     Mouth: Mucous membranes are moist.  Cardiovascular:  Rate and Rhythm: Normal rate and regular rhythm.  Pulmonary:     Effort: Pulmonary effort is normal. No respiratory distress.  Abdominal:     General: Bowel sounds are normal.     Palpations: Abdomen is soft.     Tenderness: There is no abdominal tenderness. There is no right CVA tenderness, left CVA tenderness, guarding or rebound.  Neurological:     Mental Status: She is alert.      UC Treatments / Results  Labs (all labs ordered are listed, but only abnormal results are displayed) Labs Reviewed  POCT URINALYSIS DIP (MANUAL ENTRY) - Abnormal; Notable for the following components:      Result Value   Blood, UA trace-intact (*)    Leukocytes, UA Small (1+) (*)    All other components within normal limits  URINE CULTURE    EKG   Radiology No results found.  Procedures Procedures (including critical care time)  Medications Ordered in UC Medications - No data to display  Initial Impression / Assessment and Plan / UC Course  I have reviewed the triage vital signs and the nursing notes.  Pertinent labs & imaging results that were available during my care of the patient were reviewed by me and considered in my medical decision making (see chart for  details).    Malodorous urine, urinary frequency.  No fever.  Treating with Keflex . Urine culture pending. Discussed that we will call her if the urine culture shows the need to change or discontinue the antibiotic. Instructed her to follow-up with her PCP if her symptoms are not improving.  Discussed treatment plan with patient's son via telephone.  Patient and her son agree to plan of care.     Final Clinical Impressions(s) / UC Diagnoses   Final diagnoses:  Malodorous urine  Urinary frequency     Discharge Instructions      Take the antibiotic as directed.  The urine culture is pending.  We will call you if it shows the need to change or discontinue your antibiotic.    Follow-up with your primary care provider if your symptoms are not improving.      ED Prescriptions     Medication Sig Dispense Auth. Provider   cephALEXin  (KEFLEX ) 500 MG capsule  (Status: Discontinued) Take 1 capsule (500 mg total) by mouth 2 (two) times daily for 5 days. 10 capsule Corlis Burnard DEL, NP   cephALEXin  (KEFLEX ) 500 MG capsule Take 1 capsule (500 mg total) by mouth 2 (two) times daily for 5 days. 10 capsule Corlis Burnard DEL, NP      PDMP not reviewed this encounter.   Corlis Burnard DEL, NP 12/05/23 1344

## 2023-12-07 DIAGNOSIS — J449 Chronic obstructive pulmonary disease, unspecified: Secondary | ICD-10-CM | POA: Diagnosis not present

## 2023-12-07 LAB — URINE CULTURE: Culture: NO GROWTH

## 2023-12-08 ENCOUNTER — Other Ambulatory Visit: Payer: Self-pay

## 2023-12-08 ENCOUNTER — Ambulatory Visit
Admission: RE | Admit: 2023-12-08 | Discharge: 2023-12-08 | Disposition: A | Discharge status: Home / Self Care | Payer: Medicare PPO | Source: Ambulatory Visit | Attending: Emergency Medicine | Admitting: Emergency Medicine

## 2023-12-08 VITALS — BP 147/65 | HR 65 | Temp 98.1°F | Resp 16

## 2023-12-08 DIAGNOSIS — J441 Chronic obstructive pulmonary disease with (acute) exacerbation: Secondary | ICD-10-CM | POA: Diagnosis not present

## 2023-12-08 LAB — POC COVID19/FLU A&B COMBO
Covid Antigen, POC: NEGATIVE
Influenza A Antigen, POC: NEGATIVE
Influenza B Antigen, POC: NEGATIVE

## 2023-12-08 MED ORDER — DOXYCYCLINE HYCLATE 100 MG PO TABS: 100.0000 mg | ORAL_TABLET | Freq: Two times a day (BID) | ORAL | 0 refills | Status: DC

## 2023-12-08 MED ORDER — BENZONATATE 100 MG PO CAPS: 100.0000 mg | ORAL_CAPSULE | Freq: Three times a day (TID) | ORAL | 0 refills | Status: DC

## 2023-12-08 MED ORDER — PREDNISONE 10 MG (21) PO TBPK: ORAL_TABLET | Freq: Every day | ORAL | 0 refills | Status: DC

## 2023-12-08 NOTE — Discharge Instructions (Signed)
 Today she is being treated for a flare of her COPD  Begin doxycycline  every morning and every evening for 7 days to provide coverage for bacteria  Begin prednisone  every morning with food to open and relax the airway should help settle cough and shortness of breath  May use Tessalon  pill every 8 hours to help with coughing    You can take Tylenol  and/or Ibuprofen as needed for fever reduction and pain relief.   For cough: honey 1/2 to 1 teaspoon (you can dilute the honey in water or another fluid).  You can also use guaifenesin  and dextromethorphan  for cough. You can use a humidifier for chest congestion and cough.  If you don't have a humidifier, you can sit in the bathroom with the hot shower running.      For sore throat: try warm salt water gargles, cepacol lozenges, throat spray, warm tea or water with lemon/honey, popsicles or ice, or OTC cold relief medicine for throat discomfort.   For congestion: take a daily anti-histamine like Zyrtec , Claritin , and a oral decongestant, such as pseudoephedrine.  You can also use Flonase  1-2 sprays in each nostril daily.   It is important to stay hydrated: drink plenty of fluids (water, gatorade/powerade/pedialyte, juices, or teas) to keep your throat moisturized and help further relieve irritation/discomfort.

## 2023-12-08 NOTE — ED Triage Notes (Addendum)
 Per son, pt started with cough over past 2 days, worse at night. Denies fevers.  Pt normally wears O2 @ 2L via [place] for COPD.  Pt currently finishing cephalexin Rx for UTI coverage.

## 2023-12-08 NOTE — ED Provider Notes (Signed)
 Jessica Erickson    CSN: [id number] Arrival date & time: 12/08/23  1202      History   Chief Complaint Chief Complaint  Patient presents with   Cough   Appt 1200    HPI Jessica Erickson is a 89 y.o. female.   Presents for evaluation of chest congestion, productive cough and shortness of breath worsened from baseline present for 2 days.  Symptoms started after recent urgent care visit for UTI, currently taking cephalexin .  History of COPD, currently on 2 L of O2, O2 saturation at home has been 97%.  Denies wheezing, fever or URI symptoms.  Past Medical History:  Diagnosis Date   Acoustic neuroma (HCC)    Allergy    Asthma    Bilateral swelling of feet    and legs   Bladder infection    CAD (coronary artery disease)    Cataract    Change in voice    Compression fracture of body of thoracic vertebra (HCC)    T12 09/18/15 MRI s/p fall    Constipation    COPD (chronic obstructive pulmonary disease) (HCC)    previous CXR with chronic interstitial lung dz    CVA (cerebral vascular accident) (HCC)    Depression    Diabetes (HCC)    with neuropathy   Diabetes mellitus, type 2 (HCC)    Diarrhea    Double vision    DVT (deep venous thrombosis) (HCC)    right leg 10/2015 was on coumadin  off as of 2017/2018 ; s/p IVC filter   Enuresis    Eye pain, right    Fall    Fatty liver    09/15/15 also mildly dilated pancreatitic duct rec MRCP small sub cm cyst hemangioma speeln mild right hydronephrorossi and prox. hydroureter, kidney stones, mild scarring kidneys   Female stress incontinence    Flank pain    GERD (gastroesophageal reflux disease)    with small hiatal hernia    Hard of hearing    Heart disease    History of kidney problems    Hyperlipidemia    mixed   Hypertension    Hypothyroidism, postsurgical    Impaired mobility and ADLs    uses rolling walker has caretaker 24/7 at home   Leg edema    Mixed incontinence urge and stress (female)(female)    Neuropathy     Osteoarthritis    DDD spine    Osteoporosis with fracture    T12 compression fracture   Photophobia    Pulmonary embolism (HCC)    10/2015 off coumadin  as of 04/2016   Pulmonary HTN (HCC)    mild pulm HTN, echo 10/09/15 EF 55-60%grade 1 dd, RV systolic pressure increased    Recurrent UTI    Sinus pressure    Skin cancer    BCC jawline and scalp    Thyroid  disease    follows [facility]   TIA (transient ischemic attack)    MRI 2009/2010 neg stroke    Trigeminal neuralgia    Dr. Nancye Meeter s/p gamma knife x 2, on Tegretol  since 2011/2012 no increase in dose >200 mg bid rec per family per neurology    Urinary frequency    Urinary, incontinence, stress female    Dr Penne urology     Patient Active Problem List   Diagnosis Date Noted   MRSA pneumonia (HCC) 10/07/2023   Edema 10/07/2023   Acute exacerbation of bronchiectasis (HCC) 09/26/2023   Depression, major, single episode, mild (  HCC) 09/11/2023   Urination frequency 08/19/2023   SOB (shortness of breath) 08/19/2023   Vaso vagal episode 04/02/2023   Acute respiratory failure with hypoxia and hypercapnia (HCC) 11/06/2022   COPD with acute exacerbation (HCC) 11/06/2022   Chronic heart failure with preserved ejection fraction (HFpEF) (HCC) 11/06/2022   UTI (urinary tract infection) 11/03/2022   FTT (failure to thrive) in adult 10/25/2022   Dyslipidemia 10/24/2022   Hypothyroidism 10/24/2022   GERD without esophagitis 10/24/2022   Hearing loss 09/01/2022   DNR (do not resuscitate) 08/01/2022   Abnormal gait 10/19/2021   At high risk for falls 10/14/2021   Spinal stenosis of lumbar region 09/30/2021   Pain in both lower extremities 09/30/2021   Osteoarthritis 09/30/2021   Lumbar and sacral arthritis 09/30/2021   Pure hypercholesterolemia, unspecified 09/15/2021   Hyperlipidemia, unspecified 09/15/2021   Coronary atherosclerosis 09/15/2021   Hearing decreased, bilateral 08/22/2021   Chronic respiratory failure with  hypoxia and hypercapnia (HCC) 08/14/2021   Shortness of breath 08/13/2021   CKD (chronic kidney disease), stage III (HCC) 08/13/2021   History of CVA (cerebrovascular accident) 08/13/2021   Prediabetes 06/29/2021   Pulmonary artery hypertension (HCC) 05/03/2021   Grade I diastolic dysfunction 08/16/2020   Obesity (BMI 30-39.9) 08/16/2020   Chronic kidney disease, stage 3b (HCC) 04/06/2020   Thrombocytopenia (HCC) 04/06/2020   Seborrheic keratoses 04/06/2020   Hip pain 09/30/2019   Arthritis 09/30/2019   Fall 03/11/2019   Epistaxis 12/11/2018   History of DVT (deep vein thrombosis) 11/12/2018   Bilateral lower extremity edema 06/04/2018   Hearing loss of both ears 06/04/2018   Actinic keratosis 06/04/2018   Bronchiectasis (HCC) 06/04/2018   Depression 05/17/2018   Dehydration 05/15/2018   Hypertension 05/03/2018   Lymphedema 05/02/2018   Iron deficiency anemia 05/02/2018   Presence of IVC filter 05/02/2018   Anemia 04/11/2018   Fatigue 04/11/2018   Moderate protein-calorie malnutrition (HCC) 03/22/2018   Orthostasis 03/05/2018   Dizziness 03/04/2018   COPD (chronic obstructive pulmonary disease) (HCC) 12/13/2017   Anxiety and depression 12/13/2017   Trigeminal neuralgia 12/13/2017   GERD (gastroesophageal reflux disease) 12/13/2017   Constipation 12/13/2017   Insomnia 12/13/2017   Low back pain 10/08/2015   Collapsed vertebra, not elsewhere classified, thoracic region, initial encounter for fracture (HCC) 09/20/2015   Basal cell carcinoma of scalp 04/06/2014   Fothergill's neuralgia 08/05/2013   Bladder infection, chronic 02/11/2013   Female genuine stress incontinence 02/11/2013   Incomplete bladder emptying 02/11/2013   Intrinsic sphincter deficiency 02/11/2013   Mixed incontinence 02/11/2013   Excessive urination at night 02/11/2013   Bladder retention 02/11/2013   FOM (frequency of micturition) 02/11/2013   Basal cell carcinoma of face 09/13/2011    Past  Surgical History:  Procedure Laterality Date   APPENDECTOMY     as a child, open   BRAIN SURGERY     schwnnoma removal 1996    brain tumor surgery     BREAST SURGERY     breast bx   CATARACT EXTRACTION     CHOLECYSTECTOMY     EYE SURGERY     cataract   IVC FILTER PLACEMENT ([facility] HX)     Dr. Marea 10/2015    LAPAROSCOPIC TUBAL LIGATION     MOHS SURGERY     scalp 04/2014    PERIPHERAL VASCULAR CATHETERIZATION N/A 10/11/2015   Procedure: IVC Filter Insertion;  Surgeon: Selinda GORMAN Marea, MD;  Location: [facility] INVASIVE CV [facility];  Service: Cardiovascular;  Laterality: N/A;  PERIPHERAL VASCULAR THROMBECTOMY Bilateral 03/29/2018   Procedure: PERIPHERAL VASCULAR THROMBECTOMY;  Surgeon: Marea Selinda RAMAN, MD;  Location: [facility] INV[facility] CV [facility];  Service: Cardiovascular;  Laterality: Bilateral;   PUBOVAGINAL SLING     THROAT SURGERY     THYROID  SURGERY     tumor around vocal cords    TOOTH EXTRACTION     winter 2018    TOTAL THYROIDECTOMY  1976    OB History   No obstetric history on file.      Home Medications    Prior to Admission medications   Medication Sig Start Date End Date Taking? Authorizing Provider  acetaminophen  (TYLENOL ) 325 MG tablet Take 650 mg by mouth 2 (two) times daily as needed for mild pain (pain score 1-3).   Yes [provider]  apixaban  (ELIQUIS ) 2.5 MG TABS tablet Take 1 tablet (2.5 mg total) by mouth 2 (two) times daily. 02/28/23  Yes Maribeth Camellia MATSU, MD  atorvastatin  (LIPITOR) 40 MG tablet Take 1 tablet (40 mg total) by mouth daily. 04/18/23  Yes Maribeth Camellia MATSU, MD  benzonatate  (TESSALON ) 100 MG capsule Take 1 capsule (100 mg total) by mouth every 8 (eight) hours. 12/08/23  Yes Kye Hedden R, NP  bisacodyl  (DULCOLAX) 5 MG EC tablet Take 5 mg by mouth daily as needed for moderate constipation.   Yes [provider]  calcium -vitamin D  (OSCAL WITH D) 500-5 MG-MCG tablet Take 1 tablet by mouth 2 (two) times daily. Unable to find former dose 600ca/400  vitamin D  inform patient dose change [place] 09/19/23  Yes Maribeth Camellia MATSU, MD  cephALEXin  (KEFLEX ) 500 MG capsule Take 1 capsule (500 mg total) by mouth 2 (two) times daily for 5 days. 12/05/23 12/10/23 Yes Corlis Burnard DEL, NP  cetirizine  (ZYRTEC ) 10 MG tablet Take 1 tablet (10 mg total) by mouth daily. 02/28/23  Yes Maribeth Camellia MATSU, MD  docusate sodium  (COLACE) 100 MG capsule Take 100 mg by mouth 2 (two) times daily.   Yes [provider]  furosemide  (LASIX ) 40 MG tablet TAKE ONE TABLET (40MG ) DAILY. MAY TAKE ASECOND DOSE OF 40MG  IF NEEDED AT LUNCH FOR 5 DAYS 08/21/23  Yes Maribeth Camellia MATSU, MD  gabapentin  (NEURONTIN ) 100 MG capsule TAKE 2 CAPSULES BY MOUTH 3 TIMES DAILY. 02/06/23  Yes Maribeth Camellia MATSU, MD  guaiFENesin  (MUCINEX ) 600 MG 12 hr tablet Take 2 tablets (1,200 mg total) by mouth 2 (two) times daily as needed for cough or to loosen phlegm. Patient taking differently: Take 600 mg by mouth 2 (two) times daily as needed for cough or to loosen phlegm. 05/05/21  Yes Sood, Vineet, MD  ipratropium-albuterol  (DUONEB) 0.5-2.5 (3) MG/3ML SOLN TAKE 3 ML BY NEBULIZATION EVERY 4 HOURS AS NEEDED 02/20/23  Yes Maribeth Camellia MATSU, MD  Melatonin 10 MG TABS Take 10 mg by mouth at bedtime. 11/07/21  Yes McLean-Scocuzza, Randine SAILOR, MD  mirtazapine  (REMERON ) 15 MG tablet Take 1 tablet (15 mg total) by mouth at bedtime. 02/28/23  Yes Maribeth Camellia MATSU, MD  Multiple Vitamin (MULTIVITAMIN WITH MINERALS) TABS tablet Take 1 tablet by mouth daily.    Yes [provider]  OXYGEN  Inhale 2 L/min into the lungs continuous.   Yes [provider]  pantoprazole  (PROTONIX ) 40 MG tablet TAKE ONE TABLET BY MOUTH DAILY 30 MINUTES BEFORE LUNCH OR DINNER 11/30/23  Yes Maribeth Camellia MATSU, MD  predniSONE  (STERAPRED UNI-PAK 21 TAB) 10 MG (21) TBPK tablet Take by mouth daily. Take 6 tabs by mouth daily  for 1 days, then 5 tabs for 1 days, then 4 tabs for 1 days, then 3 tabs for 1 days, 2 tabs for 1 days, then 1 tab  by mouth daily for 1 days 12/08/23  Yes Alessandria Henken, Shelba SAUNDERS, NP  TRELEGY ELLIPTA  100-62.5-25 MCG/ACT AEPB INHALE ONE PUFF DAILY. RINSE MOUTH AFTERUSE. STOP SYMBICORT  & SPIRIVA . 05/17/23  Yes Maribeth Camellia MATSU, MD  Accu-Chek Softclix Lancets lancets Use as instructed 10/11/22   Webb, Padonda B, FNP  Blood Glucose Monitoring Suppl (ACCU-CHEK AVIVA PLUS) w/Device KIT Use as directed 09/22/21   McLean-Scocuzza, Randine SAILOR, MD  doxycycline  (VIBRA -TABS) 100 MG tablet Take 1 tablet (100 mg total) by mouth 2 (two) times daily. 12/08/23   Adisyn Ruscitti, Shelba SAUNDERS, NP  feeding supplement, GLUCERNA SHAKE, (GLUCERNA SHAKE) LIQD Take 237 mLs by mouth 2 (two) times daily between meals. 08/04/21   Danford, Lonni SQUIBB, MD  glucose blood test strip Use to test blood sugar twice daily. 10/24/23   Maribeth Camellia MATSU, MD  levothyroxine  (SYNTHROID ) 150 MCG tablet TAKE ONE TABLET BY MOUTH EVERY DAY BEFORE BREAKFAST 08/30/23   Maribeth Camellia MATSU, MD  Lidocaine -Menthol (ICY HOT MAX LIDOCAINE ) 4-1 % CREA Apply 1 application. topically 3 (three) times daily with meals as needed. Dispense cream or spray pt preference 02/16/22   McLean-Scocuzza, Randine SAILOR, MD  polyethylene glycol powder (GLYCOLAX /MIRALAX ) 17 GM/SCOOP powder Take 17 g by mouth daily. 04/06/20   McLean-Scocuzza, Randine SAILOR, MD  polyvinyl alcohol  (LIQUIFILM TEARS) 1.4 % ophthalmic solution Place 1 drop into both eyes at bedtime. 08/04/21   Danford, Lonni SQUIBB, MD    Family History Family History  Problem Relation Age of Onset   Heart disease Mother    Diabetes Father    Cancer Daughter        breast ca x 2 s/p mastectomy     Social History Social History   Tobacco Use   Smoking status: Former    Current packs/day: 0.00    Average packs/day: 0.5 packs/day for 20.0 years (10.0 ttl pk-yrs)    Types: Cigarettes    Start date: 09/20/1975    Quit date: 09/20/1995    Years since quitting: 28.2   Smokeless tobacco: Never   Tobacco comments:    quit 1996 smoked 20 years max 8  cig qd   Vaping Use   Vaping status: Never Used  Substance Use Topics   Alcohol  use: No   Drug use: No     Allergies   Penicillins, Sulfa antibiotics, Amitiza [lubiprostone], Aspirin , and Penicillin g   Review of Systems Review of Systems   Physical Exam Triage Vital Signs ED Triage Vitals  Encounter Vitals Group     BP 12/08/23 1221 (!) 147/65     Systolic BP Percentile --      Diastolic BP Percentile --      Pulse Rate 12/08/23 1221 65     Resp 12/08/23 1221 16     Temp 12/08/23 1221 98.1 F (36.7 C)     Temp Source 12/08/23 1221 Temporal     SpO2 12/08/23 1221 90 %     Weight --      Height --      Head Circumference --      Peak Flow --      Pain Score 12/08/23 1223 0     Pain Loc --      Pain Education --      Exclude from Growth Chart --    No data  found.  Updated Vital Signs BP (!) 147/65 (BP Location: Left Arm)   Pulse 65   Temp 98.1 F (36.7 C) (Temporal)   Resp 16   LMP  (LMP Unknown)   SpO2 99%   Visual Acuity Right Eye Distance:   Left Eye Distance:   Bilateral Distance:    Right Eye Near:   Left Eye Near:    Bilateral Near:     Physical Exam Constitutional:      Appearance: Normal appearance.  Eyes:     Extraocular Movements: Extraocular movements intact.  Cardiovascular:     Rate and Rhythm: Normal rate and regular rhythm.     Pulses: Normal pulses.     Heart sounds: Normal heart sounds.  Pulmonary:     Effort: Pulmonary effort is normal.     Breath sounds: Normal breath sounds.  Neurological:     Mental Status: She is alert and oriented to person, place, and time. Mental status is at baseline.      UC Treatments / Results  Labs (all labs ordered are listed, but only abnormal results are displayed) Labs Reviewed  POC COVID19/FLU A&B COMBO    EKG   Radiology No results found.  Procedures Procedures (including critical care time)  Medications Ordered in UC Medications - No data to display  Initial Impression /  Assessment and Plan / UC Course  I have reviewed the triage vital signs and the nursing notes.  Pertinent labs & imaging results that were available during my care of the patient were reviewed by me and considered in my medical decision making (see chart for details).  COPD exacerbation  Patient is in no signs of distress nor toxic appearing.  Vital signs are stable.  Low suspicion for pneumonia, pneumothorax or bronchitis and therefore will defer imaging.  Open and flu testing negative.  Denies upper respiratory symptoms most likely COPD exacerbation, discussed with patient and family.  Prescribed prednisone , azithromycin  and Tessalon  for management. May use additional over-the-counter medications as needed for supportive care.  May follow-up with urgent care as needed if symptoms persist or worsen.  Reviewed urine culture which showed no growth, advise discontinuation of cephalexin  and monitoring symptoms Final Clinical Impressions(s) / UC Diagnoses   Final diagnoses:  COPD exacerbation (HCC)     Discharge Instructions      Today she is being treated for a flare of her COPD  Begin doxycycline  every morning and every evening for 7 days to provide coverage for bacteria  Begin prednisone  every morning with food to open and relax the airway should help settle cough and shortness of breath  May use Tessalon  pill every 8 hours to help with coughing    You can take Tylenol  and/or Ibuprofen as needed for fever reduction and pain relief.   For cough: honey 1/2 to 1 teaspoon (you can dilute the honey in water or another fluid).  You can also use guaifenesin  and dextromethorphan  for cough. You can use a humidifier for chest congestion and cough.  If you don't have a humidifier, you can sit in the bathroom with the hot shower running.      For sore throat: try warm salt water gargles, cepacol lozenges, throat spray, warm tea or water with lemon/honey, popsicles or ice, or OTC cold relief  medicine for throat discomfort.   For congestion: take a daily anti-histamine like Zyrtec , Claritin , and a oral decongestant, such as pseudoephedrine.  You can also use Flonase  1-2 sprays in each nostril  daily.   It is important to stay hydrated: drink plenty of fluids (water, gatorade/powerade/pedialyte, juices, or teas) to keep your throat moisturized and help further relieve irritation/discomfort.    ED Prescriptions     Medication Sig Dispense Auth. Provider   doxycycline  (VIBRA -TABS) 100 MG tablet Take 1 tablet (100 mg total) by mouth 2 (two) times daily. 14 tablet Celese Banner R, NP   benzonatate  (TESSALON ) 100 MG capsule Take 1 capsule (100 mg total) by mouth every 8 (eight) hours. 21 capsule Allesha Aronoff R, NP   predniSONE  (STERAPRED UNI-PAK 21 TAB) 10 MG (21) TBPK tablet Take by mouth daily. Take 6 tabs by mouth daily  for 1 days, then 5 tabs for 1 days, then 4 tabs for 1 days, then 3 tabs for 1 days, 2 tabs for 1 days, then 1 tab by mouth daily for 1 days 21 tablet Madyson Lukach, Shelba SAUNDERS, NP      PDMP not reviewed this encounter.   Teresa Shelba SAUNDERS, NP 12/08/23 1306

## 2023-12-13 ENCOUNTER — Ambulatory Visit: Payer: Medicare PPO | Admitting: Pulmonary Disease

## 2023-12-13 ENCOUNTER — Encounter: Payer: Self-pay | Admitting: Pulmonary Disease

## 2023-12-13 VITALS — BP 118/66 | HR 69 | Temp 97.6°F | Ht 67.0 in | Wt 186.2 lb

## 2023-12-13 DIAGNOSIS — J439 Emphysema, unspecified: Secondary | ICD-10-CM | POA: Diagnosis not present

## 2023-12-13 DIAGNOSIS — J4489 Other specified chronic obstructive pulmonary disease: Secondary | ICD-10-CM

## 2023-12-13 MED ORDER — YUPELRI 175 MCG/3ML IN SOLN: 175.0000 ug | Freq: Every day | RESPIRATORY_TRACT | 6 refills | Status: DC

## 2023-12-13 MED ORDER — BUDESONIDE 0.25 MG/2ML IN SUSP: 0.2500 mg | Freq: Two times a day (BID) | RESPIRATORY_TRACT | 2 refills | Status: DC

## 2023-12-13 MED ORDER — ARFORMOTEROL TARTRATE 15 MCG/2ML IN NEBU: 15.0000 ug | INHALATION_SOLUTION | Freq: Two times a day (BID) | RESPIRATORY_TRACT | 6 refills | Status: DC

## 2023-12-14 NOTE — Progress Notes (Signed)
 Synopsis: Referred in by Maribeth Camellia MATSU, MD   Subjective:   PATIENT ID: Jessica Erickson GENDER: female DOB: [birth year], MRN: [id number]  Chief Complaint  Patient presents with   Follow-up     Seen at ED on 12/08/2023 for COPD exacerbation. Persistent cough with clear phlegm.     HPI Jessica Erickson is a 89 year old female patient with a past medical history of COPD, Bronchiectasis, CAD, CVA type II DM presenting today to the pulmonary clinic for ongoing cough and sputum production.   History taken mainly from her son who was with her today. She is deaf and only communicates via writing. She walks independently with a walker.   She was hospitalized in October for MRSA pneumonia and was treated with Vancomycin  and discharged home. She receives in house care.   She did well however still with cough and sputum production. She carries the diagnosis of COPD and has been on Duonebs.   CT chest 09/26/2023 Filling of the right bronchus intermedius with RML and RLL consolidations. Similar opacity seen on the left lower lobe.    ROS All systems were reviewed and are negative except for the above.  Objective:   Vitals:   12/13/23 1445  BP: 118/66  Pulse: 69  Temp: 97.6 F (36.4 C)  TempSrc: Temporal  SpO2: 92%  Weight: 186 lb 3.2 oz (84.5 kg)  Height: 5' 7 (1.702 m)   92% on RA BMI Readings from Last 3 Encounters:  12/13/23 29.16 kg/m  10/22/23 28.32 kg/m  10/15/23 28.32 kg/m   Wt Readings from Last 3 Encounters:  12/13/23 186 lb 3.2 oz (84.5 kg)  10/22/23 180 lb 12.8 oz (82 kg)  10/15/23 180 lb 12.8 oz (82 kg)    Physical Exam GEN: NAD, Healthy Appearing HEENT: Supple Neck, Reactive Pupils, EOMI  CVS: Normal S1, Normal S2, RRR, No murmurs or ES appreciated  Lungs: Clear bilateral air entry.  Abdomen: Soft, non tender, non distended, + BS  Extremities: Warm and well perfused, No edema  Skin: No suspicious lesions appreciated  Psych: Normal Affect  Ancillary  Information   CBC    Component Value Date/Time   WBC 8.0 10/01/2023 0811   RBC 3.60 (L) 10/01/2023 0811   HGB 11.0 (L) 10/01/2023 0811   HGB 13.8 06/09/2021 0907   HCT 33.2 (L) 10/01/2023 0811   HCT 41.3 06/09/2021 0907   PLT 170 10/01/2023 0811   PLT 185 06/09/2021 0907   MCV 92.2 10/01/2023 0811   MCV 90 06/09/2021 0907   MCH 30.6 10/01/2023 0811   MCHC 33.1 10/01/2023 0811   RDW 14.2 10/01/2023 0811   RDW 12.9 06/09/2021 0907   LYMPHSABS 0.8 11/07/2022 0534   LYMPHSABS 1.8 06/09/2021 0907   MONOABS 0.5 11/07/2022 0534   EOSABS 0.0 11/07/2022 0534   EOSABS 0.3 06/09/2021 0907   BASOSABS 0.0 11/07/2022 0534   BASOSABS 0.0 06/09/2021 0907  Labs and imaging were reviewed.       No data to display           Assessment & Plan:  Jessica Erickson is a 89 year old female patient with a past medical history of COPD, Bronchiectasis, CAD, CVA type II DM presenting today to the pulmonary clinic for ongoing cough and sputum production.   #hx of MRSA Pneumonia  Will repeat CT chest in 4 weeks to evaluate for improvement in opacities. Will evaluate for aspiration on next visit.   #Clinical COPD  []  Duonebs as  needed.  []  Start Brovana  BID. Yupelri  daily and Pulmicort  daily.   #Bronchiectasis Airway clearance with Hypertonic saline and albuterol  as well as flutter valve BID.   Return in about 3 months (around 03/12/2024).  I spent 60 minutes caring for this patient today, including preparing to see the patient, obtaining a medical history , reviewing a separately obtained history, performing a medically appropriate examination and/or evaluation, counseling and educating the patient/family/caregiver, ordering medications, tests, or procedures, documenting clinical information in the electronic health record, and independently interpreting results (not separately reported/billed) and communicating results to the patient/family/caregiver  Darrin Barn, MD [facility] 12/14/2023 5:05 PM

## 2023-12-18 ENCOUNTER — Telehealth: Payer: Self-pay

## 2023-12-18 NOTE — Telephone Encounter (Signed)
*  Pulm  Pharmacy Patient Advocate Encounter   Received notification from CoverMyMeds that prior authorization for Yupelri  175MCG/3ML solution  is required/requested.   Insurance verification completed.   The patient is insured through [employer] .   Per test claim: PA required; PA submitted to above mentioned insurance via CoverMyMeds Key/confirmation #/EOC [id number] Status is pending

## 2023-12-19 ENCOUNTER — Other Ambulatory Visit: Payer: Self-pay | Admitting: Family Medicine

## 2023-12-19 DIAGNOSIS — J309 Allergic rhinitis, unspecified: Secondary | ICD-10-CM

## 2023-12-19 NOTE — Telephone Encounter (Signed)
 Pharmacy Patient Advocate Encounter  Received notification from [employer] that Prior Authorization for Yupelri  168mcg/3ml solution has been APPROVED from 12/18/2023 to 12/03/2024   PA #/Case ID/Reference #: Approved through Medicare Part B-approval letter attached in patients media

## 2024-01-07 DIAGNOSIS — J449 Chronic obstructive pulmonary disease, unspecified: Secondary | ICD-10-CM | POA: Diagnosis not present

## 2024-01-16 ENCOUNTER — Encounter: Payer: Self-pay | Admitting: Pulmonary Disease

## 2024-01-16 DIAGNOSIS — J439 Emphysema, unspecified: Secondary | ICD-10-CM

## 2024-01-16 MED ORDER — BUDESONIDE 0.25 MG/2ML IN SUSP: 0.2500 mg | Freq: Two times a day (BID) | RESPIRATORY_TRACT | 5 refills | Status: DC

## 2024-01-21 ENCOUNTER — Ambulatory Visit (INDEPENDENT_AMBULATORY_CARE_PROVIDER_SITE_OTHER): Payer: Medicare PPO | Admitting: *Deleted

## 2024-01-21 VITALS — Ht 67.0 in | Wt 186.0 lb

## 2024-01-21 DIAGNOSIS — Z0000 Encounter for general adult medical examination without abnormal findings: Secondary | ICD-10-CM | POA: Diagnosis not present

## 2024-01-21 NOTE — Patient Instructions (Signed)
Jessica Erickson , Thank you for taking time to come for your Medicare Wellness Visit. I appreciate your ongoing commitment to your health goals. Please review the following plan we discussed and let me know if I can assist you in the future.   Referrals/Orders/Follow-Ups/Clinician Recommendations: Consider updating your vaccines. You should receive a call from the office regarding Transfer of care.  This is a list of the screening recommended for you and due dates:  Health Maintenance  Topic Date Due   Zoster (Shingles) Vaccine (1 of 2) Never done   DEXA scan (bone density measurement)  Never done   Pneumonia Vaccine (2 of 2 - PCV) 08/30/2019   COVID-19 Vaccine (5 - 2024-25 season) 08/05/2023   Medicare Annual Wellness Visit  01/20/2025   Flu Shot  Completed   HPV Vaccine  Aged Out   DTaP/Tdap/Td vaccine  Discontinued    Advanced directives: (In Chart) A copy of your advanced directives are scanned into your chart should your provider ever need it.  Next Medicare Annual Wellness Visit scheduled for next year: Yes 01/23/25 @ 11:30

## 2024-01-21 NOTE — Progress Notes (Signed)
Subjective:   Jessica Erickson is a 89 y.o. female who presents for Medicare Annual (Subsequent) preventive examination.  Visit Complete: Virtual I connected with  Jessica Erickson on 01/21/24 by a audio enabled telemedicine application and verified that I am speaking with the correct person using two identifiers.Patient's son Jessica Erickson helped with visit. This patient declined Interactive audio and Acupuncturist. Therefore the visit was completed with audio only.   Patient Location: Home  Provider Location: Office/Clinic  I discussed the limitations of evaluation and management by telemedicine. The patient expressed understanding and agreed to proceed.  Vital Signs: Because this visit was a virtual/telehealth visit, some criteria may be missing or patient reported. Any vitals not documented were not able to be obtained and vitals that have been documented are patient reported.  Patient Medicare AWV questionnaire was completed by the patient on 01/20/24; I have confirmed that all information answered by patient is correct and no changes since this date.  Cardiac Risk Factors include: dyslipidemia;advanced age (>11men, >110 women);hypertension     Objective:    Today's Vitals   01/21/24 1130  Weight: 186 lb (84.4 kg)  Height: 5\' 7"  (1.702 m)   Body mass index is 29.13 kg/m.     01/21/2024   11:47 AM 09/26/2023   10:15 PM 09/26/2023   12:18 PM 08/22/2023    3:07 PM 01/18/2023    3:46 PM 11/06/2022    1:38 PM 10/24/2022   12:00 PM  Advanced Directives  Does Patient Have a Medical Advance Directive? Yes  No Yes Yes No Yes  Type of Estate agent of [place];Living will    Healthcare Power of [place];Living will  Living will  Does patient want to make changes to medical advance directive? No - Patient declined    No - Patient declined  No - Patient declined  Copy of Healthcare Power of Attorney in Chart? Yes - validated most recent copy scanned in chart (See  row information)    Yes - validated most recent copy scanned in chart (See row information)    Would patient like information on creating a medical advance directive?  No - Patient declined;No - Guardian declined         Current Medications (verified) Outpatient Encounter Medications as of 01/21/2024  Medication Sig   Accu-Chek Softclix Lancets lancets Use as instructed   acetaminophen (TYLENOL) 325 MG tablet Take 650 mg by mouth 2 (two) times daily as needed for mild pain (pain score 1-3).   apixaban (ELIQUIS) 2.5 MG TABS tablet Take 1 tablet (2.5 mg total) by mouth 2 (two) times daily.   arformoterol (BROVANA) 15 MCG/2ML NEBU Take 2 mLs (15 mcg total) by nebulization 2 (two) times daily.   atorvastatin (LIPITOR) 40 MG tablet Take 1 tablet (40 mg total) by mouth daily.   benzonatate (TESSALON) 100 MG capsule Take 1 capsule (100 mg total) by mouth every 8 (eight) hours.   bisacodyl (DULCOLAX) 5 MG EC tablet Take 5 mg by mouth daily as needed for moderate constipation.   Blood Glucose Monitoring Suppl (ACCU-CHEK AVIVA PLUS) w/Device KIT Use as directed   budesonide (PULMICORT) 0.25 MG/2ML nebulizer solution Take 2 mLs (0.25 mg total) by nebulization 2 (two) times daily.   calcium-vitamin D (OSCAL WITH D) 500-5 MG-MCG tablet Take 1 tablet by mouth 2 (two) times daily. Unable to find former dose 600ca/400 vitamin D inform patient dose change Oscal   cetirizine (ZYRTEC) 10 MG tablet TAKE 1 TABLET BY  MOUTH DAILY.   docusate sodium (COLACE) 100 MG capsule Take 100 mg by mouth 2 (two) times daily.   feeding supplement, GLUCERNA SHAKE, (GLUCERNA SHAKE) LIQD Take 237 mLs by mouth 2 (two) times daily between meals.   furosemide (LASIX) 40 MG tablet TAKE ONE TABLET (40MG ) DAILY. MAY TAKE ASECOND DOSE OF 40MG  IF NEEDED AT LUNCH FOR 5 DAYS   gabapentin (NEURONTIN) 100 MG capsule TAKE 2 CAPSULES BY MOUTH 3 TIMES DAILY.   glucose blood test strip Use to test blood sugar twice daily.   guaiFENesin (MUCINEX)  600 MG 12 hr tablet Take 2 tablets (1,200 mg total) by mouth 2 (two) times daily as needed for cough or to loosen phlegm. (Patient taking differently: Take 600 mg by mouth 2 (two) times daily as needed for cough or to loosen phlegm.)   ipratropium-albuterol (DUONEB) 0.5-2.5 (3) MG/3ML SOLN TAKE 3 ML BY NEBULIZATION EVERY 4 HOURS AS NEEDED   levothyroxine (SYNTHROID) 150 MCG tablet TAKE ONE TABLET BY MOUTH EVERY DAY BEFORE BREAKFAST   Lidocaine-Menthol (ICY HOT MAX LIDOCAINE) 4-1 % CREA Apply 1 application. topically 3 (three) times daily with meals as needed. Dispense cream or spray pt preference   Melatonin 10 MG TABS Take 10 mg by mouth at bedtime.   mirtazapine (REMERON) 15 MG tablet Take 1 tablet (15 mg total) by mouth at bedtime.   Multiple Vitamin (MULTIVITAMIN WITH MINERALS) TABS tablet Take 1 tablet by mouth daily.    OXYGEN Inhale 2 L/min into the lungs continuous.   pantoprazole (PROTONIX) 40 MG tablet TAKE ONE TABLET BY MOUTH DAILY 30 MINUTES BEFORE LUNCH OR DINNER   polyethylene glycol powder (GLYCOLAX/MIRALAX) 17 GM/SCOOP powder Take 17 g by mouth daily.   polyvinyl alcohol (LIQUIFILM TEARS) 1.4 % ophthalmic solution Place 1 drop into both eyes at bedtime.   revefenacin (YUPELRI) 175 MCG/3ML nebulizer solution Take 3 mLs (175 mcg total) by nebulization daily.   [DISCONTINUED] doxycycline (VIBRA-TABS) 100 MG tablet Take 1 tablet (100 mg total) by mouth 2 (two) times daily. (Patient not taking: Reported on 01/21/2024)   [DISCONTINUED] predniSONE (STERAPRED UNI-PAK 21 TAB) 10 MG (21) TBPK tablet Take by mouth daily. Take 6 tabs by mouth daily  for 1 days, then 5 tabs for 1 days, then 4 tabs for 1 days, then 3 tabs for 1 days, 2 tabs for 1 days, then 1 tab by mouth daily for 1 days (Patient not taking: Reported on 01/21/2024)   No facility-administered encounter medications on file as of 01/21/2024.    Allergies (verified) Penicillins, Sulfa antibiotics, Amitiza [lubiprostone], Aspirin, and  Penicillin g   History: Past Medical History:  Diagnosis Date   Acoustic neuroma (HCC)    Allergy    Asthma    Bilateral swelling of feet    and legs   Bladder infection    CAD (coronary artery disease)    Cataract    Change in voice    Compression fracture of body of thoracic vertebra (HCC)    T12 09/18/15 MRI s/p fall    Constipation    COPD (chronic obstructive pulmonary disease) (HCC)    previous CXR with chronic interstitial lung dz    CVA (cerebral vascular accident) (HCC)    Depression    Diabetes (HCC)    with neuropathy   Diabetes mellitus, type 2 (HCC)    Diarrhea    Double vision    DVT (deep venous thrombosis) (HCC)    right leg 10/2015 was on coumadin off as  of 2017/2018 ; s/p IVC filter   Enuresis    Eye pain, right    Fall    Fatty liver    09/15/15 also mildly dilated pancreatitic duct rec MRCP small sub cm cyst hemangioma speeln mild right hydronephrorossi and prox. hydroureter, kidney stones, mild scarring kidneys   Female stress incontinence    Flank pain    GERD (gastroesophageal reflux disease)    with small hiatal hernia    Hard of hearing    Heart disease    History of kidney problems    Hyperlipidemia    mixed   Hypertension    Hypothyroidism, postsurgical    Impaired mobility and ADLs    uses rolling walker has caretaker 24/7 at home   Leg edema    Mixed incontinence urge and stress (female)(female)    Neuropathy    Osteoarthritis    DDD spine    Osteoporosis with fracture    T12 compression fracture   Photophobia    Pulmonary embolism (HCC)    10/2015 off coumadin as of 04/2016   Pulmonary HTN (HCC)    mild pulm HTN, echo 10/09/15 EF 55-60%grade 1 dd, RV systolic pressure increased    Recurrent UTI    Sinus pressure    Skin cancer    BCC jawline and scalp    Thyroid disease    follows [facility]   TIA (transient ischemic attack)    MRI 2009/2010 neg stroke    Trigeminal neuralgia    Dr. Cammie Mcgee s/p gamma knife x 2, on  Tegretol since 2011/2012 no increase in dose >200 mg bid rec per family per neurology    Urinary frequency    Urinary, incontinence, stress female    Dr Apolinar Junes urology    Past Surgical History:  Procedure Laterality Date   APPENDECTOMY     as a child, open   BRAIN SURGERY     schwnnoma removal 1996    brain tumor surgery     BREAST SURGERY     breast bx   CATARACT EXTRACTION     CHOLECYSTECTOMY     EYE SURGERY     cataract   IVC FILTER PLACEMENT ([facility] HX)     Dr. Wyn Quaker 10/2015    LAPAROSCOPIC TUBAL LIGATION     MOHS SURGERY     scalp 04/2014    PERIPHERAL VASCULAR CATHETERIZATION N/A 10/11/2015   Procedure: IVC Filter Insertion;  Surgeon: Annice Needy, MD;  Location: [facility] INVASIVE CV LAB;  Service: Cardiovascular;  Laterality: N/A;   PERIPHERAL VASCULAR THROMBECTOMY Bilateral 03/29/2018   Procedure: PERIPHERAL VASCULAR THROMBECTOMY;  Surgeon: Annice Needy, MD;  Location: [facility] INVASIVE CV LAB;  Service: Cardiovascular;  Laterality: Bilateral;   PUBOVAGINAL SLING     THROAT SURGERY     THYROID SURGERY     tumor around vocal cords    TOOTH EXTRACTION     winter 2018    TOTAL THYROIDECTOMY  1976   Family History  Problem Relation Age of Onset   Heart disease Mother    Diabetes Father    Cancer Daughter        breast ca x 2 s/p mastectomy    Social History   Socioeconomic History   Marital status: Widowed    Spouse name: Not on file   Number of children: 5   Years of education: Not on file   Highest education level: Some college, no degree  Occupational History   Occupation: retired  Tobacco Use   Smoking status: Former    Current packs/day: 0.00    Average packs/day: 0.5 packs/day for 20.0 years (10.0 ttl pk-yrs)    Types: Cigarettes    Start date: 09/20/1975    Quit date: 09/20/1995    Years since quitting: 28.3   Smokeless tobacco: Never   Tobacco comments:    quit 1996 smoked 20 years max 8 cig qd   Vaping Use   Vaping status: Never Used  Substance and  Sexual Activity   Alcohol use: No   Drug use: No   Sexual activity: Not on file  Other Topics Concern   Not on file  Social History Narrative   Lives at home with Jessica Erickson son and caretakers come to house    Has children    Likes to ride stationary bike and sew    [name] lives with as of 2022 and he is POA youngest son            Social Drivers of Corporate investment banker Strain: Low Risk  (01/21/2024)   Overall Financial Resource Strain (CARDIA)    Difficulty of Paying Living Expenses: Not hard at all  Food Insecurity: No Food Insecurity (01/21/2024)   Hunger Vital Sign    Worried About Running Out of Food in the Last Year: Never true    Ran Out of Food in the Last Year: Never true  Transportation Needs: No Transportation Needs (01/21/2024)   PRAPARE - Administrator, Civil Service (Medical): No    Lack of Transportation (Non-Medical): No  Physical Activity: Inactive (01/21/2024)   Exercise Vital Sign    Days of Exercise per Week: 0 days    Minutes of Exercise per Session: 0 min  Stress: No Stress Concern Present (01/21/2024)   [employer] of Occupational Health - Occupational Stress Questionnaire    Feeling of Stress : Not at all  Social Connections: Moderately Isolated (01/21/2024)   Social Connection and Isolation Panel [NHANES]    Frequency of Communication with Friends and Family: More than three times a week    Frequency of Social Gatherings with Friends and Family: More than three times a week    Attends Religious Services: Never    Database administrator or Organizations: Yes    Attends Banker Meetings: Never    Marital Status: Widowed    Tobacco Counseling Counseling given: Not Answered Tobacco comments: quit 1996 smoked 20 years max 8 cig qd    Clinical Intake:  Pre-visit preparation completed: Yes  Pain : No/denies pain     BMI - recorded: 29.13 Nutritional Status: BMI 25 -29 Overweight Nutritional Risks: None Diabetes:  No  How often do you need to have someone help you when you read instructions, pamphlets, or other written materials from your doctor or pharmacy?: 1 - Never  Interpreter Needed?: No  Information entered by :: R. Daphne Karrer LPN   Activities of Daily Living    01/20/2024    7:02 PM 09/26/2023   10:18 PM  In your present state of health, do you have any difficulty performing the following activities:  Hearing? 1 1  Comment totally deaf   Vision? 0 0  Difficulty concentrating or making decisions? 1 0  Walking or climbing stairs? 1   Dressing or bathing? 1   Doing errands, shopping? 1   Preparing Food and eating ? Y   Using the Toilet? Y   In the past six months, have  you accidently leaked urine? Y   Do you have problems with loss of bowel control? Y   Managing your Medications? Y   Managing your Finances? Y   Housekeeping or managing your Housekeeping? Y     Patient Care Team: Glori Luis, MD as PCP - General (Family Medicine) Janann Colonel, MD as Consulting Physician (Pulmonary Disease)  Indicate any recent Medical Services you may have received from other than [facility] providers in the past year (date may be approximate).     Assessment:   This is a routine wellness examination for Jessica Erickson.  Hearing/Vision screen Hearing Screening - Comments:: deaf Vision Screening - Comments:: glasses   Goals Addressed             This Visit's Progress    Patient Stated       Wants to continue to sew and stay active       Depression Screen    01/21/2024   11:39 AM 10/15/2023    3:56 PM 09/11/2023   11:10 AM 08/14/2023    4:11 PM 07/11/2023    2:08 PM 03/28/2023    3:45 PM 01/18/2023    3:45 PM  PHQ 2/9 Scores  PHQ - 2 Score 0 0 0 2 0 0 0  PHQ- 9 Score 0 0 3 5 2  0     Fall Risk    01/20/2024    7:02 PM 10/15/2023    3:56 PM 09/11/2023   11:10 AM 07/11/2023    2:08 PM 04/02/2023    2:30 PM  Fall Risk   Falls in the past year? 0 1 0 0 0  Number falls in past yr: 0 0  0 1 0  Injury with Fall? 0 0 0 1 0  Risk for fall due to : No Fall Risks History of fall(s) No Fall Risks History of fall(s) No Fall Risks  Follow up Falls prevention discussed;Falls evaluation completed Falls evaluation completed Falls evaluation completed Falls evaluation completed Falls evaluation completed    MEDICARE RISK AT HOME: Medicare Risk at Home Any stairs in or around the home?: (Patient-Rptd) Yes If so, are there any without handrails?: (Patient-Rptd) No Home free of loose throw rugs in walkways, pet beds, electrical cords, etc?: (Patient-Rptd) Yes Adequate lighting in your home to reduce risk of falls?: (Patient-Rptd) Yes Life alert?: (Patient-Rptd) No Use of a cane, walker or w/c?: (Patient-Rptd) Yes Grab bars in the bathroom?: (Patient-Rptd) Yes Shower chair or bench in shower?: (Patient-Rptd) Yes Elevated toilet seat or a handicapped toilet?: (Patient-Rptd) Yes     Cognitive Function:        01/21/2024   11:47 AM 01/18/2023    3:48 PM 09/29/2020   12:16 PM 09/19/2018   11:21 AM  6CIT Screen  What Year? 0 points 0 points 0 points 0 points  What month? 0 points 0 points 0 points 0 points  What time? 0 points 0 points 0 points 0 points  Count back from 20 0 points 0 points  0 points  Months in reverse 0 points   --  Repeat phrase 0 points 0 points    Total Score 0 points       Immunizations Immunization History  Administered Date(s) Administered   Fluad Quad(high Dose 65+) 09/30/2019, 08/12/2020, 09/29/2021   Fluad Trivalent(High Dose 65+) 09/11/2023   Influenza, High Dose Seasonal PF 09/19/2018, 09/06/2022   Influenza-Unspecified 08/05/2015, 09/02/2017   PFIZER Comirnaty(Gray Top)Covid-19 Tri-Sucrose Vaccine 08/05/2021   PFIZER(Purple Top)SARS-COV-2 Vaccination 12/15/2019, 01/05/2020,  09/13/2020   Pfizer Covid-19 Vaccine Bivalent Booster 85yrs & up 09/09/2021   Pneumococcal Polysaccharide-23 08/29/2018    TDAP status: Due, Education has been provided  regarding the importance of this vaccine. Advised may receive this vaccine at local pharmacy or Health Dept. Aware to provide a copy of the vaccination record if obtained from local pharmacy or Health Dept. Verbalized acceptance and understanding.  Flu Vaccine status: Up to date  Pneumococcal vaccine status: Due, Education has been provided regarding the importance of this vaccine. Advised may receive this vaccine at local pharmacy or Health Dept. Aware to provide a copy of the vaccination record if obtained from local pharmacy or Health Dept. Verbalized acceptance and understanding.  Covid-19 vaccine status: Information provided on how to obtain vaccines.   Qualifies for Shingles Vaccine? Yes   Zostavax completed No   Shingrix Completed?: No.    Education has been provided regarding the importance of this vaccine. Patient has been advised to call insurance company to determine out of pocket expense if they have not yet received this vaccine. Advised may also receive vaccine at local pharmacy or Health Dept. Verbalized acceptance and understanding.  Screening Tests Health Maintenance  Topic Date Due   Zoster Vaccines- Shingrix (1 of 2) Never done   DEXA SCAN  Never done   Pneumonia Vaccine 50+ Years old (2 of 2 - PCV) 08/30/2019   COVID-19 Vaccine (5 - 2024-25 season) 08/05/2023   Medicare Annual Wellness (AWV)  01/20/2025   INFLUENZA VACCINE  Completed   HPV VACCINES  Aged Out   DTaP/Tdap/Td  Discontinued    Health Maintenance  Health Maintenance Due  Topic Date Due   Zoster Vaccines- Shingrix (1 of 2) Never done   DEXA SCAN  Never done   Pneumonia Vaccine 62+ Years old (2 of 2 - PCV) 08/30/2019   COVID-19 Vaccine (5 - 2024-25 season) 08/05/2023    Colorectal cancer screening: No longer required.   Mammogram status: No longer required due to NA age.  Bone Density Statu Patient declined   Lung Cancer Screening: (Low Dose CT Chest recommended if Age 32-80 years, 20 pack-year  currently smoking OR have quit w/in 15years.) does not qualify.     Additional Screening:  Hepatitis C Screening: does not qualify; Completed NA age  Vision Screening: Recommended annual ophthalmology exams for early detection of glaucoma and other disorders of the eye. Is the patient up to date with their annual eye exam?  Yes  Who is the provider or what is the name of the office in which the patient attends annual eye exams? [facility]  If pt is not established with a provider, would they like to be referred to a provider to establish care? No .   Dental Screening: Recommended annual dental exams for proper oral hygiene   Community Resource Referral / Chronic Care Management: CRR required this visit?  No   CCM required this visit?  No     Plan:     I have personally reviewed and noted the following in the patient's chart:   Medical and social history Use of alcohol, tobacco or illicit drugs  Current medications and supplements including opioid prescriptions. Patient is not currently taking opioid prescriptions. Functional ability and status Nutritional status Physical activity Advanced directives List of other physicians Hospitalizations, surgeries, and ER visits in previous 12 months Vitals Screenings to include cognitive, depression, and falls Referrals and appointments  In addition, I have reviewed and discussed with patient certain preventive  protocols, quality metrics, and best practice recommendations. A written personalized care plan for preventive services as well as general preventive health recommendations were provided to patient.     Sydell Axon, LPN   1/61/0960   After Visit Summary: (MyChart) Due to this being a telephonic visit, the after visit summary with patients personalized plan was offered to patient via MyChart   Nurse Notes: None

## 2024-01-31 ENCOUNTER — Telehealth: Payer: Self-pay | Admitting: Pulmonary Disease

## 2024-01-31 NOTE — Telephone Encounter (Signed)
 I called to schedule Jessica Erickson Chest CT and I spoke with her son Jessica Erickson he states he is her legal guardian. He stated that since the medicine changes have been made Jessica Erickson has had more of a heavier cough. He states she used a neb med with prednisone first and within 20 minutes uses her Trelegy.  He wants to know if the change in her cough has to do with the medication changes made

## 2024-01-31 NOTE — Telephone Encounter (Signed)
 I spoke with Cletis Athens (DPR), he said he had noticed in the last week that the patient's cough is heavier with yellow sputum. No fevers, chills or sweats. She did start the nebulizer medications in January after her visit with you. Cletis Athens wants to know if this can cause her cough to get worse. He said she does her nebulizer medications and then 20 mins later she will take her Trelegy.   Cletis Athens did not know the name of the 2 nebulizer medications she is using. He will send a MyChart message later today with the names of the Neb medications she is using since there is 4 listed in her chart and she is only using 2.

## 2024-02-04 ENCOUNTER — Ambulatory Visit
Admission: RE | Admit: 2024-02-04 | Discharge: 2024-02-04 | Disposition: A | Discharge status: Home / Self Care | Source: Ambulatory Visit | Attending: Family Medicine | Admitting: Family Medicine

## 2024-02-04 VITALS — BP 127/62 | HR 60 | Temp 98.5°F | Resp 14

## 2024-02-04 DIAGNOSIS — N3001 Acute cystitis with hematuria: Secondary | ICD-10-CM | POA: Diagnosis not present

## 2024-02-04 DIAGNOSIS — J449 Chronic obstructive pulmonary disease, unspecified: Secondary | ICD-10-CM | POA: Diagnosis not present

## 2024-02-04 LAB — URINALYSIS, W/ REFLEX TO CULTURE (INFECTION SUSPECTED)
Bilirubin Urine: NEGATIVE
Glucose, UA: NEGATIVE mg/dL
Ketones, ur: NEGATIVE mg/dL
Nitrite: NEGATIVE
Protein, ur: NEGATIVE mg/dL
RBC / HPF: >50 RBC/hpf (ref 0–5)
Specific Gravity, Urine: 1.015 (ref 1.005–1.030)
pH: 6.5 (ref 5.0–8.0)

## 2024-02-04 MED ORDER — CEFDINIR 300 MG PO CAPS: 300.0000 mg | ORAL_CAPSULE | Freq: Two times a day (BID) | ORAL | 0 refills | Status: DC

## 2024-02-04 NOTE — Discharge Instructions (Addendum)
 Stop by the pharmacy to pick up your prescriptions.  Follow up with your primary care provider or return to the urgent care, if not improving.   I sent your urine for culture, someone may call to change antibiotics based of your urine culture results.

## 2024-02-04 NOTE — ED Triage Notes (Addendum)
 Sx started last night  Blood on pad.  Dark urine Fatigue   Neighbor is with her.   No complaints of burning or urgency.  No fever

## 2024-02-05 ENCOUNTER — Ambulatory Visit: Payer: Self-pay | Admitting: Family Medicine

## 2024-02-05 ENCOUNTER — Encounter: Payer: Self-pay | Admitting: Internal Medicine

## 2024-02-05 ENCOUNTER — Ambulatory Visit: Admitting: Internal Medicine

## 2024-02-05 VITALS — BP 122/60 | Ht 67.0 in | Wt 168.0 lb

## 2024-02-05 DIAGNOSIS — B951 Streptococcus, group B, as the cause of diseases classified elsewhere: Secondary | ICD-10-CM | POA: Diagnosis not present

## 2024-02-05 DIAGNOSIS — N342 Other urethritis: Secondary | ICD-10-CM

## 2024-02-05 DIAGNOSIS — Z7901 Long term (current) use of anticoagulants: Secondary | ICD-10-CM

## 2024-02-05 DIAGNOSIS — N3001 Acute cystitis with hematuria: Secondary | ICD-10-CM | POA: Diagnosis not present

## 2024-02-05 DIAGNOSIS — N390 Urinary tract infection, site not specified: Secondary | ICD-10-CM

## 2024-02-05 LAB — URINE CULTURE: Culture: >=100000 — AB

## 2024-02-05 NOTE — Telephone Encounter (Signed)
 Pt was seen at [facility] yesterday for blood in urine. Pt is waiting on the culture results to come back to confirm if she has a UTI or not. Pt's son called concerned because the pt is on Eliquis.

## 2024-02-05 NOTE — Patient Instructions (Signed)
 Swelling of the Urethra (Urethritis) in Adults: What to Know  Urethritis is swelling or inflammation of the urethra. The urethra is the part of your body that drains pee from the bladder. When you have urethritis, it's important to get treatment early. If not treated early, it may lead to problems like urinary tract infections in the bladder or kidneys. What are the causes? Urethritis may be caused by: Germs that are spread through sex. This is the leading cause of urethritis. The germs may be bacteria or a virus. Injury to the urethra. This can happen after a soft tube called a catheter is put into the urethra to drain pee, or after a medical device has been inserted into the area. Chemicals. This may include bubble bath, shampoo, or perfumed soaps. It could also be products like spermicide. An illness. What increases the risk? Some things may make you more likely to get urethritis. These include: Having sex without using a condom. Having more than one sex partner. Not bathing regularly. For females, not wiping from front to back after using the bathroom can raise the risk. What are the signs or symptoms? Symptoms of urethritis include: Pain when peeing. Needing to pee often. Feeling the need to pee urgently. Itching and pain in the vagina or penis. Fluid or blood coming from the penis. Most females have no symptoms. How is this diagnosed? Urethritis may be diagnosed based on: Your symptoms. Your medical history. A physical exam. Tests. These may include: Pee (urine) tests. Swabs of fluid from the urethra. How is this treated? Treatment depends on the cause. Urethritis caused by a bacterial infection is treated with antibiotics. Tell your sex partners if you have this problem. Any sex partners you've had in the past 60 days should be treated. Follow these instructions at home: Medicines Take medicines only as told. If you were given antibiotics, take them as told. Do not stop  taking them even if you start to feel better. Lifestyle Avoid using perfumed soaps, bubble bath, and shampoo when you bathe or shower. Rinse the vaginal area with plain water after a bath. Wear cotton underwear. Not wearing underwear when you go to bed can help. If you're female, make sure to wipe from front to back after using the toilet. Do not have sex until your health care provider approves. When you do have sex, be sure to practice safe sex and use condoms. General instructions Drink more fluids as told. Get tested again 3 months after treatment to make sure the infection is gone. Your sex partner should also get tested again. Contact a health care provider if: Your symptoms haven't improved after 3 days. Your symptoms get worse. You have eye redness or pain. You have belly pain or pelvic pain (in females). You have joint pain or a rash. You have a fever or chills. You throw up or feel like you may throw up. This information is not intended to replace advice given to you by your health care provider. Make sure you discuss any questions you have with your health care provider. Document Revised: 07/20/2023 Document Reviewed: 07/20/2023 Elsevier Patient Education  2024 [employer].

## 2024-02-05 NOTE — Telephone Encounter (Signed)
 If having persistent increased bleeding and increased pain on eliquis, would recommend reevaluation.

## 2024-02-05 NOTE — Progress Notes (Signed)
 Subjective:    Patient ID: Jessica Erickson, female    DOB: September 22, 1926, 89 y.o.   MRN: [id number]  HPI  Discussed the use of AI scribe software for clinical note transcription with the patient, who gave verbal consent to proceed.   Jessica Erickson is a 89 year old female with chronic kidney disease who presents with blood in the urine. She is accompanied by her aide.  She has been experiencing blood in the urine since Sunday, with significant bleeding and clots. There is uncertainty whether the bleeding is vaginal or urinary in origin, but it appears to be coming from the urethra. A pad placed at 2:30 PM was about halfway full of blood by the time of the visit.  She was diagnosed with a urinary tract infection at urgent care yesterday and started on cefdinir 300 mg twice a day for five days due to an allergy to Penicillin G. Her urine culture revealed a group B strep infection. Despite starting the antibiotic, she continues to experience significant bleeding.  She is currently on Eliquis 2.5 mg twice a day. She has been taking ibuprofen, which is not advisable given her use of Eliquis. No blood in the stool, but she has not had a recent bowel movement.   Review of Systems   Past Medical History:  Diagnosis Date   Acoustic neuroma (HCC)    Allergy    Asthma    Bilateral swelling of feet    and legs   Bladder infection    CAD (coronary artery disease)    Cataract    Change in voice    Compression fracture of body of thoracic vertebra (HCC)    T12 09/18/15 MRI s/p fall    Constipation    COPD (chronic obstructive pulmonary disease) (HCC)    previous CXR with chronic interstitial lung dz    CVA (cerebral vascular accident) (HCC)    Depression    Diabetes (HCC)    with neuropathy   Diabetes mellitus, type 2 (HCC)    Diarrhea    Double vision    DVT (deep venous thrombosis) (HCC)    right leg 10/2015 was on coumadin off as of 2017/2018 ; s/p IVC filter   Enuresis    Eye pain,  right    Fall    Fatty liver    09/15/15 also mildly dilated pancreatitic duct rec MRCP small sub cm cyst hemangioma speeln mild right hydronephrorossi and prox. hydroureter, kidney stones, mild scarring kidneys   Female stress incontinence    Flank pain    GERD (gastroesophageal reflux disease)    with small hiatal hernia    Hard of hearing    Heart disease    History of kidney problems    Hyperlipidemia    mixed   Hypertension    Hypothyroidism, postsurgical    Impaired mobility and ADLs    uses rolling walker has caretaker 24/7 at home   Leg edema    Mixed incontinence urge and stress (female)(female)    Neuropathy    Osteoarthritis    DDD spine    Osteoporosis with fracture    T12 compression fracture   Photophobia    Pulmonary embolism (HCC)    10/2015 off coumadin as of 04/2016   Pulmonary HTN (HCC)    mild pulm HTN, echo 10/09/15 EF 55-60%grade 1 dd, RV systolic pressure increased    Recurrent UTI    Sinus pressure    Skin cancer  BCC jawline and scalp    Thyroid disease    follows [facility]   TIA (transient ischemic attack)    MRI 2009/2010 neg stroke    Trigeminal neuralgia    Dr. Cammie Mcgee s/p gamma knife x 2, on Tegretol since 2011/2012 no increase in dose >200 mg bid rec per family per neurology    Urinary frequency    Urinary, incontinence, stress female    Dr Apolinar Junes urology     Current Outpatient Medications  Medication Sig Dispense Refill   Accu-Chek Softclix Lancets lancets Use as instructed 100 each 12   acetaminophen (TYLENOL) 325 MG tablet Take 650 mg by mouth 2 (two) times daily as needed for mild pain (pain score 1-3).     apixaban (ELIQUIS) 2.5 MG TABS tablet Take 1 tablet (2.5 mg total) by mouth 2 (two) times daily. 180 tablet 3   arformoterol (BROVANA) 15 MCG/2ML NEBU Take 2 mLs (15 mcg total) by nebulization 2 (two) times daily. 120 mL 6   atorvastatin (LIPITOR) 40 MG tablet Take 1 tablet (40 mg total) by mouth daily. 90 tablet 3    benzonatate (TESSALON) 100 MG capsule Take 1 capsule (100 mg total) by mouth every 8 (eight) hours. 21 capsule 0   bisacodyl (DULCOLAX) 5 MG EC tablet Take 5 mg by mouth daily as needed for moderate constipation.     Blood Glucose Monitoring Suppl (ACCU-CHEK AVIVA PLUS) w/Device KIT Use as directed 1 kit 0   budesonide (PULMICORT) 0.25 MG/2ML nebulizer solution Take 2 mLs (0.25 mg total) by nebulization 2 (two) times daily. 120 mL 5   calcium-vitamin D (OSCAL WITH D) 500-5 MG-MCG tablet Take 1 tablet by mouth 2 (two) times daily. Unable to find former dose 600ca/400 vitamin D inform patient dose change Oscal 180 tablet 3   cefdinir (OMNICEF) 300 MG capsule Take 1 capsule (300 mg total) by mouth 2 (two) times daily. 14 capsule 0   cetirizine (ZYRTEC) 10 MG tablet TAKE 1 TABLET BY MOUTH DAILY. 90 tablet 3   docusate sodium (COLACE) 100 MG capsule Take 100 mg by mouth 2 (two) times daily.     feeding supplement, GLUCERNA SHAKE, (GLUCERNA SHAKE) LIQD Take 237 mLs by mouth 2 (two) times daily between meals.  0   furosemide (LASIX) 40 MG tablet TAKE ONE TABLET (40MG ) DAILY. MAY TAKE ASECOND DOSE OF 40MG  IF NEEDED AT LUNCH FOR 5 DAYS 120 tablet 3   gabapentin (NEURONTIN) 100 MG capsule TAKE 2 CAPSULES BY MOUTH 3 TIMES DAILY. 504 capsule 3   glucose blood test strip Use to test blood sugar twice daily. 200 each 1   guaiFENesin (MUCINEX) 600 MG 12 hr tablet Take 2 tablets (1,200 mg total) by mouth 2 (two) times daily as needed for cough or to loosen phlegm. (Patient taking differently: Take 600 mg by mouth 2 (two) times daily as needed for cough or to loosen phlegm.)     ipratropium-albuterol (DUONEB) 0.5-2.5 (3) MG/3ML SOLN TAKE 3 ML BY NEBULIZATION EVERY 4 HOURS AS NEEDED 360 mL 11   levothyroxine (SYNTHROID) 150 MCG tablet TAKE ONE TABLET BY MOUTH EVERY DAY BEFORE BREAKFAST 90 tablet 1   Lidocaine-Menthol (ICY HOT MAX LIDOCAINE) 4-1 % CREA Apply 1 application. topically 3 (three) times daily with meals as  needed. Dispense cream or spray pt preference 120 g 11   Melatonin 10 MG TABS Take 10 mg by mouth at bedtime. 90 tablet 3   mirtazapine (REMERON) 15 MG tablet Take 1  tablet (15 mg total) by mouth at bedtime. 90 tablet 3   Multiple Vitamin (MULTIVITAMIN WITH MINERALS) TABS tablet Take 1 tablet by mouth daily.      OXYGEN Inhale 2 L/min into the lungs continuous.     pantoprazole (PROTONIX) 40 MG tablet TAKE ONE TABLET BY MOUTH DAILY 30 MINUTES BEFORE LUNCH OR DINNER 90 tablet 3   polyethylene glycol powder (GLYCOLAX/MIRALAX) 17 GM/SCOOP powder Take 17 g by mouth daily. 255 g 11   polyvinyl alcohol (LIQUIFILM TEARS) 1.4 % ophthalmic solution Place 1 drop into both eyes at bedtime. 15 mL 0   revefenacin (YUPELRI) 175 MCG/3ML nebulizer solution Take 3 mLs (175 mcg total) by nebulization daily. 90 mL 6   TRELEGY ELLIPTA 100-62.5-25 MCG/ACT AEPB Inhale 1 puff into the lungs daily.     No current facility-administered medications for this visit.    Allergies  Allergen Reactions   Penicillins Shortness Of Breath, Rash and Other (See Comments)    Has patient had a PCN reaction causing immediate rash, facial/tongue/throat swelling, SOB or lightheadedness with hypotension: Yes Has patient had a PCN reaction causing severe rash involving mucus membranes or skin necrosis: No Has patient had a PCN reaction that required hospitalization No Has patient had a PCN reaction occurring within the last 10 years: No If all of the above answers are "NO", then may proceed with Cephalosporin use.   Sulfa Antibiotics Shortness Of Breath, Rash and Other (See Comments)   Amitiza [Lubiprostone]     N/v/d   Aspirin Other (See Comments)    Reaction:  Unknown  Other reaction(s): Bleeding (intolerance) Per patient " causes nose to bleed" Can take 81 mg daily without any complications Other reaction(s): "bloody nose"    Penicillin G Other (See Comments)    Has patient had a PCN reaction causing immediate rash,  facial/tongue/throat swelling, SOB or lightheadedness with hypotension: No Has patient had a PCN reaction causing severe rash involving mucus membranes or skin necrosis: Unknown Has patient had a PCN reaction that required hospitalization: Unknown Has patient had a PCN reaction occurring within the last 10 years: Unknown If all of the above answers are "NO", then may proceed with Cephalosporin use.    Family History  Problem Relation Age of Onset   Heart disease Mother    Diabetes Father    Cancer Daughter        breast ca x 2 s/p mastectomy     Social History   Socioeconomic History   Marital status: Widowed    Spouse name: Not on file   Number of children: 5   Years of education: Not on file   Highest education level: Some college, no degree  Occupational History   Occupation: retired  Tobacco Use   Smoking status: Former    Current packs/day: 0.00    Average packs/day: 0.5 packs/day for 20.0 years (10.0 ttl pk-yrs)    Types: Cigarettes    Start date: 09/20/1975    Quit date: 09/20/1995    Years since quitting: 28.3   Smokeless tobacco: Never   Tobacco comments:    quit 1996 smoked 20 years max 8 cig qd   Vaping Use   Vaping status: Never Used  Substance and Sexual Activity   Alcohol use: No   Drug use: No   Sexual activity: Not on file  Other Topics Concern   Not on file  Social History Narrative   Lives at home with Jonny Ruiz son and caretakers come to house  Has children    Likes to ride stationary bike and sew    Jonny Ruiz lives with as of 2022 and he is POA youngest son            Social Drivers of Corporate investment banker Strain: Low Risk  (01/21/2024)   Overall Financial Resource Strain (CARDIA)    Difficulty of Paying Living Expenses: Not hard at all  Food Insecurity: No Food Insecurity (01/21/2024)   Hunger Vital Sign    Worried About Running Out of Food in the Last Year: Never true    Ran Out of Food in the Last Year: Never true  Transportation  Needs: No Transportation Needs (01/21/2024)   PRAPARE - Administrator, Civil Service (Medical): No    Lack of Transportation (Non-Medical): No  Physical Activity: Inactive (01/21/2024)   Exercise Vital Sign    Days of Exercise per Week: 0 days    Minutes of Exercise per Session: 0 min  Stress: No Stress Concern Present (01/21/2024)   [employer] of Occupational Health - Occupational Stress Questionnaire    Feeling of Stress : Not at all  Social Connections: Moderately Isolated (01/21/2024)   Social Connection and Isolation Panel [NHANES]    Frequency of Communication with Friends and Family: More than three times a week    Frequency of Social Gatherings with Friends and Family: More than three times a week    Attends Religious Services: Never    Database administrator or Organizations: Yes    Attends Banker Meetings: Never    Marital Status: Widowed  Intimate Partner Violence: Not At Risk (01/21/2024)   Humiliation, Afraid, Rape, and Kick questionnaire    Fear of Current or Ex-Partner: No    Emotionally Abused: No    Physically Abused: No    Sexually Abused: No     Constitutional: Denies fever, malaise, fatigue, headache or abrupt weight changes.  Respiratory: Denies difficulty breathing, shortness of breath, cough or sputum production.   Cardiovascular: Denies chest pain, chest tightness, palpitations or swelling in the hands or feet.  Gastrointestinal: Denies abdominal pain, bloating, constipation, diarrhea or blood in the stool.  GU: Pt reports blood in urine. Denies urgency, frequency, pain with urination, burning sensation, odor or discharge. Skin: Pt reports pallor. Denies redness, rashes, lesions or ulcercations.  Neurological: Denies dizziness, difficulty with memory, difficulty with speech or problems with balance and coordination.   No other specific complaints in a complete review of systems (except as listed in HPI above).      Objective:    Physical Exam BP 122/60 (BP Location: Right Arm, Patient Position: Sitting, Cuff Size: Normal)   Ht 5\' 7"  (1.702 m)   Wt 168 lb (76.2 kg)   LMP  (LMP Unknown)   BMI 26.31 kg/m   Wt Readings from Last 3 Encounters:  01/21/24 186 lb (84.4 kg)  12/13/23 186 lb 3.2 oz (84.5 kg)  10/22/23 180 lb 12.8 oz (82 kg)    General: Appears her stated age, overweight, chronically ill appearing, in NAD. Skin: Warm, dry and intact.  Cardiovascular: Normal rate and rhythm.  Pulmonary/Chest: Normal effort and positive vesicular breath sounds. No respiratory distress. No wheezes, rales or ronchi noted.  Abdomen: Soft and nontender. Normal bowel sounds. No distention or masses noted.  Pelvic: Normal female anatomy.  Urethra inflamed.  Vaginal stenosis noted.  Unable to tell if the bleeding is coming from the urethra or vaginal area at this time.  Musculoskeletal: Gait slow and steady with use of walker. Neurological: Alert and oriented.   BMET    Component Value Date/Time   NA 142 10/01/2023 0811   NA 143 06/09/2021 0907   K 3.1 (L) 10/01/2023 0811   CL 104 10/01/2023 0811   CO2 29 10/01/2023 0811   GLUCOSE 109 (H) 10/01/2023 0811   BUN 21 10/01/2023 0811   BUN 19 06/09/2021 0907   CREATININE 0.87 10/01/2023 0811   CALCIUM 7.6 (L) 10/01/2023 0811   GFRNONAA >60 10/01/2023 0811   GFRAA 39 (L) 07/09/2020 1234    Lipid Panel     Component Value Date/Time   CHOL 128 07/25/2022 1416   TRIG 88.0 07/25/2022 1416   HDL 52.90 07/25/2022 1416   CHOLHDL 2 07/25/2022 1416   VLDL 17.6 07/25/2022 1416   LDLCALC 57 07/25/2022 1416    CBC    Component Value Date/Time   WBC 8.0 10/01/2023 0811   RBC 3.60 (L) 10/01/2023 0811   HGB 11.0 (L) 10/01/2023 0811   HGB 13.8 06/09/2021 0907   HCT 33.2 (L) 10/01/2023 0811   HCT 41.3 06/09/2021 0907   PLT 170 10/01/2023 0811   PLT 185 06/09/2021 0907   MCV 92.2 10/01/2023 0811   MCV 90 06/09/2021 0907   MCH 30.6 10/01/2023 0811   MCHC 33.1  10/01/2023 0811   RDW 14.2 10/01/2023 0811   RDW 12.9 06/09/2021 0907   LYMPHSABS 0.8 11/07/2022 0534   LYMPHSABS 1.8 06/09/2021 0907   MONOABS 0.5 11/07/2022 0534   EOSABS 0.0 11/07/2022 0534   EOSABS 0.3 06/09/2021 0907   BASOSABS 0.0 11/07/2022 0534   BASOSABS 0.0 06/09/2021 0907    Hgb A1C Lab Results  Component Value Date   HGBA1C 6.0 04/02/2023            Assessment & Plan:   Assessment and Plan    Urinary Bleeding Noted significant bleeding in urine, possibly due to Group B Strep UTI. Patient is on Cefdinir 300mg  BID for 5 days. Patient also on Eliquis 2.5mg  BID, which may be contributing to bleeding. -Continue Cefdinir 300mg  BID. -Hold Eliquis dose tonight and reassess in the morning based on blood counts. -Order CBC and renal function tests. -If bleeding worsens, patient should go to ER. -Consider follow-up with primary care for possible pelvic ultrasound if bleeding persists although they may have to stick with transabdominal ultrasound that she has evidence of vaginal stenosis.   Follow-up with your PCP as previously scheduled Nicki Reaper, NP

## 2024-02-05 NOTE — Telephone Encounter (Signed)
 Copied from CRM [id number]. Topic: Clinical - Medical Advice >> Feb 05, 2024  1:48 PM Alcus Dad wrote: Reason for CRM: Patient's son called and stated that patient started bleeding from urethra Sunday. Patient went to [facility] yesterday and ran antibiotic. Patient may have UTI... she is waiting on culture to come back. Patient is on Eliquis  Chief Complaint: bleeding from urethra with voiding (bright red blood) Symptoms: bright red blood noted with and without urination Frequency: Sunday Disposition: [] ED /[] Urgent Care (no appt availability in office) / [x] Appointment(In office/virtual)/ []  [facility]/ [] Home Care/ [] Refused Recommended Disposition /[] [facility]/ []  Follow-up with PCP Additional Notes: found appt at [facility] with Nicki Reaper NP  Reason for Disposition  Taking Coumadin (warfarin) or other strong blood thinner, or known bleeding disorder (e.g., thrombocytopenia)  Answer Assessment - Initial Assessment Questions 1. COLOR of URINE: "Describe the color of the urine."  (e.g., tea-colored, pink, red, bloody) "Do you have blood clots in your urine?" (e.g., none, pea, grape, small coin)     red 2. ONSET: "When did the bleeding start?"      Sunday 3. EPISODES: "How many times has there been blood in the urine?" or "How many times today?"     2-3 but is constant 4. PAIN with URINATION: "Is there any pain with passing your urine?" If Yes, ask: "How bad is the pain?"  (Scale 1-10; or mild, moderate, severe)    - MILD: Complains slightly about urination hurting.    - MODERATE: Interferes with normal activities.      - SEVERE: Excruciating, unwilling or unable to urinate because of the pain.      no 5. FEVER: "Do you have a fever?" If Yes, ask: "What is your temperature, how was it measured, and when did it start?"     no 7. OTHER SYMPTOMS: "Do you have any other symptoms?" (e.g., back/flank pain, abdomen pain, vomiting)     None reported  Protocols used: Urine -  Blood In-A-AH

## 2024-02-06 ENCOUNTER — Ambulatory Visit: Admitting: Internal Medicine

## 2024-02-06 ENCOUNTER — Encounter: Payer: Self-pay | Admitting: Internal Medicine

## 2024-02-06 ENCOUNTER — Ambulatory Visit
Admission: RE | Admit: 2024-02-06 | Discharge: 2024-02-06 | Disposition: A | Discharge status: Home / Self Care | Source: Ambulatory Visit | Attending: Internal Medicine | Admitting: Internal Medicine

## 2024-02-06 ENCOUNTER — Ambulatory Visit: Payer: Self-pay | Admitting: Family Medicine

## 2024-02-06 VITALS — BP 114/68 | HR 62 | Temp 97.5°F | Ht 67.0 in | Wt 182.2 lb

## 2024-02-06 DIAGNOSIS — N939 Abnormal uterine and vaginal bleeding, unspecified: Secondary | ICD-10-CM | POA: Diagnosis not present

## 2024-02-06 DIAGNOSIS — N950 Postmenopausal bleeding: Secondary | ICD-10-CM | POA: Insufficient documentation

## 2024-02-06 LAB — POC URINALSYSI DIPSTICK (AUTOMATED)
Bilirubin, UA: NEGATIVE
Glucose, UA: NEGATIVE
Ketones, UA: NEGATIVE
Nitrite, UA: NEGATIVE
Protein, UA: POSITIVE — AB
Spec Grav, UA: 1.015 (ref 1.010–1.025)
Urobilinogen, UA: 0.2 U/dL
pH, UA: 5.5 (ref 5.0–8.0)

## 2024-02-06 LAB — COMPLETE METABOLIC PANEL WITH GFR
AG Ratio: 1.6 (calc) (ref 1.0–2.5)
ALT: 9 U/L (ref 6–29)
AST: 18 U/L (ref 10–35)
Albumin: 3.6 g/dL (ref 3.6–5.1)
Alkaline phosphatase (APISO): 52 U/L (ref 37–153)
BUN/Creatinine Ratio: 14 (calc) (ref 6–22)
BUN: 19 mg/dL (ref 7–25)
CO2: 35 mmol/L — ABNORMAL HIGH (ref 20–32)
Calcium: 8.9 mg/dL (ref 8.6–10.4)
Chloride: 101 mmol/L (ref 98–110)
Creat: 1.38 mg/dL — ABNORMAL HIGH (ref 0.60–0.95)
Globulin: 2.2 g/dL (ref 1.9–3.7)
Glucose, Bld: 84 mg/dL (ref 65–139)
Potassium: 4.2 mmol/L (ref 3.5–5.3)
Sodium: 145 mmol/L (ref 135–146)
Total Bilirubin: 0.4 mg/dL (ref 0.2–1.2)
Total Protein: 5.8 g/dL — ABNORMAL LOW (ref 6.1–8.1)
eGFR: 35 mL/min/{1.73_m2} — ABNORMAL LOW (ref >=60)

## 2024-02-06 LAB — CBC
HCT: 36 % (ref 35.0–45.0)
Hemoglobin: 11.3 g/dL — ABNORMAL LOW (ref 11.7–15.5)
MCH: 29.4 pg (ref 27.0–33.0)
MCHC: 31.4 g/dL — ABNORMAL LOW (ref 32.0–36.0)
MCV: 93.8 fL (ref 80.0–100.0)
MPV: 12.1 fL (ref 7.5–12.5)
Platelets: 191 10*3/uL (ref 140–400)
RBC: 3.84 10*6/uL (ref 3.80–5.10)
RDW: 12.7 % (ref 11.0–15.0)
WBC: 6.8 10*3/uL (ref 3.8–10.8)

## 2024-02-06 NOTE — Progress Notes (Signed)
 Acute Office Visit  Subjective:     Patient ID: Jessica Erickson, female    DOB: 02-04-1926, 89 y.o.   MRN: [id number]  Chief Complaint  Patient presents with   Acute Visit    Vaginal bleeding    HPI Patient is in today for vaginal bleeding.  Caregiver states that patient has had bleeding since Sunday.  Pain noted with urination but also patient was noted to have bleeding in between as well requiring pad placement.  Patient goes through 3-4 pads a day and a pad placed right before she came to the office was already halfway soaked through.  Caregiver also noted blood clots beginning yesterday.  Patient is on Eliquis for history of DVT.  She did go to an urgent care on March 3 and was prescribed cefdinir for possible UTI (urine culture grew group B strep).  Patient went to urgent care again yesterday and was noted to be mildly anemic with a hemoglobin of 11.3 as well as an AKI with a creatinine 1.3.  Patient's baseline creatinine runs between 0.8 and 1.4.  She was referred to [place] for further evaluation by the urgent care.  Patient denies any lightheadedness currently.  No syncope.  No fevers or chills.  Review of Systems  Constitutional: Negative.   Respiratory: Negative.    Cardiovascular: Negative.   Genitourinary:  Positive for hematuria.       Patient with vaginal bleeding with blood clots.  She is going through 3-4 pads a day  Neurological: Negative.  Negative for dizziness and loss of consciousness.  Psychiatric/Behavioral: Negative.          Objective:    BP 114/68   Pulse 62   Temp (!) 97.5 F (36.4 C)   Ht 5\' 7"  (1.702 m)   Wt 182 lb 3.2 oz (82.6 kg)   LMP  (LMP Unknown)   SpO2 97%   BMI 28.54 kg/m    Physical Exam Constitutional:      Appearance: Normal appearance.  HENT:     Head: Normocephalic and atraumatic.  Cardiovascular:     Rate and Rhythm: Normal rate and regular rhythm.     Heart sounds: Normal heart sounds.  Pulmonary:     Effort: No respiratory  distress.     Breath sounds: Normal breath sounds. No wheezing or rales.  Genitourinary:    Comments: Vaginal pad noted to be halfway soaked through with blood.  Blood noted in vaginal canal. Neurological:     Mental Status: She is alert.  Psychiatric:        Mood and Affect: Mood normal.        Behavior: Behavior normal.     Results for orders placed or performed in visit on 02/06/24  POCT Urinalysis Dipstick (Automated)  Result Value Ref Range   Color, UA amber    Clarity, UA slightly cloudy    Glucose, UA Negative Negative   Bilirubin, UA neg    Ketones, UA neg    Spec Grav, UA 1.015 1.010 - 1.025   Blood, UA large    pH, UA 5.5 5.0 - 8.0   Protein, UA Positive (A) Negative   Urobilinogen, UA 0.2 0.2 or 1.0 E.U./dL   Nitrite, UA neg    Leukocytes, UA Small (1+) (A) Negative        Assessment & Plan:   Problem List Items Addressed This Visit       Other   Abnormal vaginal bleeding in postmenopausal patient -  Primary   -Patient was noted to have bleeding with urination since Sunday.  However, she was also noted to be bleeding in between and the aide has been using pads for the blood.  Patient is going through 3-4 pads a day and yesterday, they noted blood clots -Patient was seen in urgent care initially and was diagnosed with a urinary tract infection and urine culture grew group B strep.  Patient was started on cefdinir for this with no improvement in her symptoms -She was then seen again in urgent care yesterday and they were unsure whether his urethral bleeding versus vaginal bleeding and were unable to do a vaginal exam at that time.  Referred her to [place] for further evaluation -On inspection today, patient appears to be bleeding from her vaginal canal and not from her urethra -Urine dipstick was positive for blood but this could be secondary to vaginal bleeding as well -Etiology behind her bleeding remains uncertain at this time.  Will need to rule out underlying  malignancy -Will obtain abdominal and transvaginal ultrasound -Referral to GYN placed -Will obtain CBC and BMP today -No further workup at this time      Relevant Orders   US Pelvic Complete With Transvaginal   CBC with Differential/Platelet   Ambulatory referral to Obstetrics / Gynecology   Basic metabolic panel   POCT Urinalysis Dipstick (Automated) (Completed)    No orders of the defined types were placed in this encounter.   No follow-ups on file.  Earl Lagos, MD

## 2024-02-06 NOTE — Telephone Encounter (Signed)
 Pt's son was notified. Pt was seen yesterday at a different [facility] facility. Informed son that the labs that were collected would be reviewed by the doctor pt seen yesterday.

## 2024-02-06 NOTE — Patient Instructions (Addendum)
-  It was a pleasure meeting you today -I am concerned about the bleeding that you are having.  I do believe that is coming from the vaginal canal. -I will obtain an urgent ultrasound of your abdomen as well as a transvaginal ultrasound to see what is the cause of the bleeding -Please hold your Eliquis for now -I have also referred you to the gynecologist for further evaluation of the bleed -We will obtain some blood work today as well to check your blood counts -Please contact me if there are any questions or concerns

## 2024-02-06 NOTE — Telephone Encounter (Signed)
 Reviewed. Pt seen in office today and pelvic ultrasound ordered.

## 2024-02-06 NOTE — Assessment & Plan Note (Addendum)
-  Patient was noted to have bleeding with urination since Sunday.  However, she was also noted to be bleeding in between and the aide has been using pads for the blood.  Patient is going through 3-4 pads a day and yesterday, they noted blood clots -Patient was seen in urgent care initially and was diagnosed with a urinary tract infection and urine culture grew group B strep.  Patient was started on cefdinir for this with no improvement in her symptoms -She was then seen again in urgent care yesterday and they were unsure whether his urethral bleeding versus vaginal bleeding and were unable to do a vaginal exam at that time.  Referred her to [place] for further evaluation -On inspection today, patient appears to be bleeding from her vaginal canal and not from her urethra -Urine dipstick was positive for blood but this could be secondary to vaginal bleeding as well -Etiology behind her bleeding remains uncertain at this time.  Will need to rule out underlying malignancy -Will obtain abdominal and transvaginal ultrasound -Referral to GYN placed -Will obtain CBC and BMP today -No further workup at this time

## 2024-02-06 NOTE — Telephone Encounter (Signed)
 Copied from CRM [id number]. Topic: Clinical - Red Word Triage >> Feb 06, 2024  9:33 AM Alphonzo Lemmings O wrote: [facility] that prompted transfer to Nurse Triage: mom going through some bleeding through her [facility] [place] and she is waiting to get in . On mychart they got she need to be seen by her pcp  and do a ultrasound . Need to get her in to see somebody and see where the bleeding is coming from  Chief Complaint: Vaginal bleeding Symptoms: Bleeding Frequency: Sunday Pertinent Negatives: Patient denies abdominal pain Disposition: [] ED /[] Urgent Care (no appt availability in office) / [x] Appointment(In office/virtual)/ []  [facility]/ [] Home Care/ [] Refused Recommended Disposition /[]  Mobile Bus/ []  Follow-up with PCP Additional Notes: Spoke to son on behalf of his mother who has been experiencing bleeding from her vaginal area since Sunday. Patient was seen at an UC on 02/04/24 and in an alternate office yesterday for symptoms. Son stated that the provider she saw yesterday was not able to complete a full vaginal exam because the patient has vaginal stenosis. Son denied abdominal pain and dizziness in the patient. Son did state that mother appears more pale and fatigued than normal. Patient was advised to resume Eliquis, per patient chart. Son stated he is nervous to resume Eliquis because the bleeding is still persisting. Patient was advised to follow-up with PCP office if symptoms persist, per patient chart. This RN scheduled patient in PCP office for this afternoon. This RN advised patient/son to call back if symptoms worsen. Patient/son complied.   Reason for Disposition  Taking Coumadin (warfarin) or other strong blood thinner, or known bleeding disorder (e.g., thrombocytopenia)  Answer Assessment - Initial Assessment Questions 1. AMOUNT: "Describe the bleeding that you are having."    - SPOTTING: spotting, or pinkish / brownish mucous discharge; does not fill panty liner or pad    - MILD:   less than 1 pad / hour; less than patient's usual menstrual bleeding   - MODERATE: 1-2 pads / hour; 1 menstrual cup every 6 hours; small-medium blood clots (e.g., pea, grape, small coin)   - SEVERE: soaking 2 or more pads/hour for 2 or more hours; 1 menstrual cup every 2 hours; bleeding not contained by pads or continuous red blood from vagina; large blood clots (e.g., golf ball, large coin)      States blood fills one pad over night, fills  2. ONSET: "When did the bleeding begin?" "Is it continuing now?"     Sunday, states this is the first time bleeding has come from urethra/vaginal area 3. MENSTRUAL PERIOD: "When was the last normal menstrual period?" "How is this different than your period?"     N/A 4. REGULARITY: "How regular are your periods?"     N/A 5. ABDOMEN PAIN: "Do you have any pain?" "How bad is the pain?"  (e.g., Scale 1-10; mild, moderate, or severe)   - MILD (1-3): doesn't interfere with normal activities, abdomen soft and not tender to touch    - MODERATE (4-7): interferes with normal activities or awakens from sleep, abdomen tender to touch    - SEVERE (8-10): excruciating pain, doubled over, unable to do any normal activities      Denies 9. BLOOD THINNER MEDICINES: "Do you take any blood thinners?" (e.g., Coumadin / warfarin, Pradaxa / dabigatran, aspirin)     Eliquis, was instructed to stop taking and instructed to resume, via patient's chart 10. CAUSE: "What do you think is causing the bleeding?" (e.g., recent gyn  surgery, recent gyn procedure; known bleeding disorder, cervical cancer, polycystic ovarian disease, fibroids)        Urethra 11. HEMODYNAMIC STATUS: "Are you weak or feeling lightheaded?" If Yes, ask: "Can you stand and walk normally?"        Denies, son states patient is more pale and fatigued than normal 12. OTHER SYMPTOMS: "What other symptoms are you having with the bleeding?" (e.g., passed tissue, vaginal discharge, fever, menstrual-type cramps)       Blood  in urine  Protocols used: Vaginal Bleeding - Abnormal-A-AH

## 2024-02-07 ENCOUNTER — Encounter: Payer: Self-pay | Admitting: Family Medicine

## 2024-02-07 LAB — BASIC METABOLIC PANEL
BUN: 19 mg/dL (ref 6–23)
CO2: 35 meq/L — ABNORMAL HIGH (ref 19–32)
Calcium: 8.9 mg/dL (ref 8.4–10.5)
Chloride: 100 meq/L (ref 96–112)
Creatinine, Ser: 1.36 mg/dL — ABNORMAL HIGH (ref 0.40–1.20)
GFR: 33.04 mL/min — ABNORMAL LOW (ref >60.00)
Glucose, Bld: 102 mg/dL — ABNORMAL HIGH (ref 70–99)
Potassium: 4 meq/L (ref 3.5–5.1)
Sodium: 143 meq/L (ref 135–145)

## 2024-02-07 LAB — CBC WITH DIFFERENTIAL/PLATELET
Basophils Absolute: 0 10*3/uL (ref 0.0–0.1)
Basophils Relative: 0.7 % (ref 0.0–3.0)
Eosinophils Absolute: 0.2 10*3/uL (ref 0.0–0.7)
Eosinophils Relative: 3.3 % (ref 0.0–5.0)
HCT: 36.2 % (ref 36.0–46.0)
Hemoglobin: 11.9 g/dL — ABNORMAL LOW (ref 12.0–15.0)
Lymphocytes Relative: 26 % (ref 12.0–46.0)
Lymphs Abs: 1.7 10*3/uL (ref 0.7–4.0)
MCHC: 33 g/dL (ref 30.0–36.0)
MCV: 94.2 fl (ref 78.0–100.0)
Monocytes Absolute: 0.6 10*3/uL (ref 0.1–1.0)
Monocytes Relative: 9.7 % (ref 3.0–12.0)
Neutro Abs: 4 10*3/uL (ref 1.4–7.7)
Neutrophils Relative %: 60.3 % (ref 43.0–77.0)
Platelets: 188 10*3/uL (ref 150.0–400.0)
RBC: 3.84 Mil/uL — ABNORMAL LOW (ref 3.87–5.11)
RDW: 14.8 % (ref 11.5–15.5)
WBC: 6.6 10*3/uL (ref 4.0–10.5)

## 2024-02-08 NOTE — ED Provider Notes (Signed)
 [facility]    CSN: [id number] Arrival date & time: 02/04/24  1108      History   Chief Complaint Chief Complaint  Patient presents with   dark urine     HPI HPI Jessica Erickson is a 89 y.o. female.  History provided by patient and her neighbor   Jessica Erickson presents for dark urine with some hematuria and fatigue that started last night.  Tried nothing prior to arrival.  Has  not had any antibiotics in last 30 days.   Denies known STI exposure.   No LMP recorded (lmp unknown). Patient is postmenopausal.      Past Medical History:  Diagnosis Date   Acoustic neuroma (HCC)    Allergy    Asthma    Bilateral swelling of feet    and legs   Bladder infection    CAD (coronary artery disease)    Cataract    Change in voice    Compression fracture of body of thoracic vertebra (HCC)    T12 09/18/15 MRI s/p fall    Constipation    COPD (chronic obstructive pulmonary disease) (HCC)    previous CXR with chronic interstitial lung dz    CVA (cerebral vascular accident) (HCC)    Depression    Diabetes (HCC)    with neuropathy   Diabetes mellitus, type 2 (HCC)    Diarrhea    Double vision    DVT (deep venous thrombosis) (HCC)    right leg 10/2015 was on coumadin off as of 2017/2018 ; s/p IVC filter   Enuresis    Eye pain, right    Fall    Fatty liver    09/15/15 also mildly dilated pancreatitic duct rec MRCP small sub cm cyst hemangioma speeln mild right hydronephrorossi and prox. hydroureter, kidney stones, mild scarring kidneys   Female stress incontinence    Flank pain    GERD (gastroesophageal reflux disease)    with small hiatal hernia    Hard of hearing    Heart disease    History of kidney problems    Hyperlipidemia    mixed   Hypertension    Hypothyroidism, postsurgical    Impaired mobility and ADLs    uses rolling walker has caretaker 24/7 at home   Leg edema    Mixed incontinence urge and stress (female)(female)    Neuropathy     Osteoarthritis    DDD spine    Osteoporosis with fracture    T12 compression fracture   Photophobia    Pulmonary embolism (HCC)    10/2015 off coumadin as of 04/2016   Pulmonary HTN (HCC)    mild pulm HTN, echo 10/09/15 EF 55-60%grade 1 dd, RV systolic pressure increased    Recurrent UTI    Sinus pressure    Skin cancer    BCC jawline and scalp    Thyroid disease    follows [facility]   TIA (transient ischemic attack)    MRI 2009/2010 neg stroke    Trigeminal neuralgia    Dr. Cammie Mcgee s/p gamma knife x 2, on Tegretol since 2011/2012 no increase in dose >200 mg bid rec per family per neurology    Urinary frequency    Urinary, incontinence, stress female    Dr Apolinar Junes urology     Patient Active Problem List   Diagnosis Date Noted   Abnormal vaginal bleeding in postmenopausal patient 02/06/2024   MRSA pneumonia (HCC) 10/07/2023   Edema 10/07/2023  Acute exacerbation of bronchiectasis (HCC) 09/26/2023   Depression, major, single episode, mild (HCC) 09/11/2023   Urination frequency 08/19/2023   SOB (shortness of breath) 08/19/2023   Vaso vagal episode 04/02/2023   Acute respiratory failure with hypoxia and hypercapnia (HCC) 11/06/2022   COPD with acute exacerbation (HCC) 11/06/2022   Chronic heart failure with preserved ejection fraction (HFpEF) (HCC) 11/06/2022   UTI (urinary tract infection) 11/03/2022   FTT (failure to thrive) in adult 10/25/2022   Dyslipidemia 10/24/2022   Hypothyroidism 10/24/2022   GERD without esophagitis 10/24/2022   Hearing loss 09/01/2022   DNR (do not resuscitate) 08/01/2022   Abnormal gait 10/19/2021   At high risk for falls 10/14/2021   Spinal stenosis of lumbar region 09/30/2021   Pain in both lower extremities 09/30/2021   Osteoarthritis 09/30/2021   Lumbar and sacral arthritis 09/30/2021   Pure hypercholesterolemia, unspecified 09/15/2021   Hyperlipidemia, unspecified 09/15/2021   Coronary atherosclerosis 09/15/2021   Hearing  decreased, bilateral 08/22/2021   Chronic respiratory failure with hypoxia and hypercapnia (HCC) 08/14/2021   Shortness of breath 08/13/2021   CKD (chronic kidney disease), stage III (HCC) 08/13/2021   History of CVA (cerebrovascular accident) 08/13/2021   Prediabetes 06/29/2021   Pulmonary artery hypertension (HCC) 05/03/2021   Grade I diastolic dysfunction 08/16/2020   Obesity (BMI 30-39.9) 08/16/2020   Chronic kidney disease, stage 3b (HCC) 04/06/2020   Thrombocytopenia (HCC) 04/06/2020   Seborrheic keratoses 04/06/2020   Hip pain 09/30/2019   Arthritis 09/30/2019   Fall 03/11/2019   Epistaxis 12/11/2018   History of DVT (deep vein thrombosis) 11/12/2018   Bilateral lower extremity edema 06/04/2018   Hearing loss of both ears 06/04/2018   Actinic keratosis 06/04/2018   Bronchiectasis (HCC) 06/04/2018   Depression 05/17/2018   Dehydration 05/15/2018   Hypertension 05/03/2018   Lymphedema 05/02/2018   Iron deficiency anemia 05/02/2018   Presence of IVC filter 05/02/2018   Anemia 04/11/2018   Fatigue 04/11/2018   Moderate protein-calorie malnutrition (HCC) 03/22/2018   Orthostasis 03/05/2018   Dizziness 03/04/2018   COPD (chronic obstructive pulmonary disease) (HCC) 12/13/2017   Anxiety and depression 12/13/2017   Trigeminal neuralgia 12/13/2017   GERD (gastroesophageal reflux disease) 12/13/2017   Constipation 12/13/2017   Insomnia 12/13/2017   Low back pain 10/08/2015   Collapsed vertebra, not elsewhere classified, thoracic region, initial encounter for fracture (HCC) 09/20/2015   Basal cell carcinoma of scalp 04/06/2014   Fothergill's neuralgia 08/05/2013   Bladder infection, chronic 02/11/2013   Female genuine stress incontinence 02/11/2013   Incomplete bladder emptying 02/11/2013   Intrinsic sphincter deficiency 02/11/2013   Mixed incontinence 02/11/2013   Excessive urination at night 02/11/2013   Bladder retention 02/11/2013   FOM (frequency of micturition)  02/11/2013   Basal cell carcinoma of face 09/13/2011    Past Surgical History:  Procedure Laterality Date   APPENDECTOMY     as a child, open   BRAIN SURGERY     schwnnoma removal 1996    brain tumor surgery     BREAST SURGERY     breast bx   CATARACT EXTRACTION     CHOLECYSTECTOMY     EYE SURGERY     cataract   IVC FILTER PLACEMENT ([facility] HX)     Dr. Wyn Quaker 10/2015    LAPAROSCOPIC TUBAL LIGATION     MOHS SURGERY     scalp 04/2014    PERIPHERAL VASCULAR CATHETERIZATION N/A 10/11/2015   Procedure: IVC Filter Insertion;  Surgeon: Annice Needy, MD;  Location: [facility];  Service: Cardiovascular;  Laterality: N/A;   PERIPHERAL VASCULAR THROMBECTOMY Bilateral 03/29/2018   Procedure: PERIPHERAL VASCULAR THROMBECTOMY;  Surgeon: Annice Needy, MD;  Location: [facility];  Service: Cardiovascular;  Laterality: Bilateral;   PUBOVAGINAL SLING     THROAT SURGERY     THYROID SURGERY     tumor around vocal cords    TOOTH EXTRACTION     winter 2018    TOTAL THYROIDECTOMY  1976    OB History   No obstetric history on file.      Home Medications    Prior to Admission medications   Medication Sig Start Date End Date Taking? Authorizing Provider  Accu-Chek Softclix Lancets lancets Use as instructed 10/11/22  Yes Worthy Rancher B, FNP  acetaminophen (TYLENOL) 325 MG tablet Take 650 mg by mouth 2 (two) times daily as needed for mild pain (pain score 1-3).   Yes [provider]  apixaban (ELIQUIS) 2.5 MG TABS tablet Take 1 tablet (2.5 mg total) by mouth 2 (two) times daily. 02/28/23  Yes Glori Luis, MD  arformoterol (BROVANA) 15 MCG/2ML NEBU Take 2 mLs (15 mcg total) by nebulization 2 (two) times daily. 12/13/23  Yes Assaker, [place] Bali, MD  atorvastatin (LIPITOR) 40 MG tablet Take 1 tablet (40 mg total) by mouth daily. 04/18/23  Yes Glori Luis, MD  bisacodyl (DULCOLAX) 5 MG EC tablet Take 5 mg by mouth daily as needed for moderate constipation.   Yes  [provider]  Blood Glucose Monitoring Suppl (ACCU-CHEK AVIVA PLUS) w/Device KIT Use as directed 09/22/21  Yes McLean-Scocuzza, Pasty Spillers, MD  budesonide (PULMICORT) 0.25 MG/2ML nebulizer solution Take 2 mLs (0.25 mg total) by nebulization 2 (two) times daily. 01/16/24 01/15/25 Yes Assaker, [place] Bali, MD  calcium-vitamin D (OSCAL WITH D) 500-5 MG-MCG tablet Take 1 tablet by mouth 2 (two) times daily. Unable to find former dose 600ca/400 vitamin D inform patient dose change Oscal 09/19/23  Yes Glori Luis, MD  cefdinir (OMNICEF) 300 MG capsule Take 1 capsule (300 mg total) by mouth 2 (two) times daily. 02/04/24  Yes Aeron Lheureux, DO  cetirizine (ZYRTEC) 10 MG tablet TAKE 1 TABLET BY MOUTH DAILY. 12/19/23  Yes Glori Luis, MD  docusate sodium (COLACE) 100 MG capsule Take 100 mg by mouth 2 (two) times daily.   Yes [provider]  feeding supplement, GLUCERNA SHAKE, (GLUCERNA SHAKE) LIQD Take 237 mLs by mouth 2 (two) times daily between meals. 08/04/21  Yes Danford, Earl Lites, MD  furosemide (LASIX) 40 MG tablet TAKE ONE TABLET (40MG ) DAILY. MAY TAKE ASECOND DOSE OF 40MG  IF NEEDED AT LUNCH FOR 5 DAYS 08/21/23  Yes Glori Luis, MD  gabapentin (NEURONTIN) 100 MG capsule TAKE 2 CAPSULES BY MOUTH 3 TIMES DAILY. 02/06/23  Yes Glori Luis, MD  glucose blood test strip Use to test blood sugar twice daily. 10/24/23  Yes Glori Luis, MD  guaiFENesin (MUCINEX) 600 MG 12 hr tablet Take 2 tablets (1,200 mg total) by mouth 2 (two) times daily as needed for cough or to loosen phlegm. Patient taking differently: Take 600 mg by mouth 2 (two) times daily as needed for cough or to loosen phlegm. 05/05/21  Yes Sood, Laurier Nancy, MD  ipratropium-albuterol (DUONEB) 0.5-2.5 (3) MG/3ML SOLN TAKE 3 ML BY NEBULIZATION EVERY 4 HOURS AS NEEDED 02/20/23  Yes Glori Luis, MD  levothyroxine (SYNTHROID) 150 MCG tablet TAKE ONE TABLET BY MOUTH EVERY DAY BEFORE BREAKFAST  08/30/23  Yes  Glori Luis, MD  Lidocaine-Menthol (ICY HOT MAX LIDOCAINE) 4-1 % CREA Apply 1 application. topically 3 (three) times daily with meals as needed. Dispense cream or spray pt preference 02/16/22  Yes McLean-Scocuzza, Pasty Spillers, MD  Melatonin 10 MG TABS Take 10 mg by mouth at bedtime. 11/07/21  Yes McLean-Scocuzza, Pasty Spillers, MD  mirtazapine (REMERON) 15 MG tablet Take 1 tablet (15 mg total) by mouth at bedtime. 02/28/23  Yes Glori Luis, MD  Multiple Vitamin (MULTIVITAMIN WITH MINERALS) TABS tablet Take 1 tablet by mouth daily.    Yes [provider]  OXYGEN Inhale 2 L/min into the lungs continuous.   Yes [provider]  pantoprazole (PROTONIX) 40 MG tablet TAKE ONE TABLET BY MOUTH DAILY 30 MINUTES BEFORE LUNCH OR DINNER 11/30/23  Yes Glori Luis, MD  polyethylene glycol powder (GLYCOLAX/MIRALAX) 17 GM/SCOOP powder Take 17 g by mouth daily. 04/06/20  Yes McLean-Scocuzza, Pasty Spillers, MD  polyvinyl alcohol (LIQUIFILM TEARS) 1.4 % ophthalmic solution Place 1 drop into both eyes at bedtime. 08/04/21  Yes Danford, Earl Lites, MD  revefenacin (YUPELRI) 175 MCG/3ML nebulizer solution Take 3 mLs (175 mcg total) by nebulization daily. 12/13/23  Yes Assaker, West Bali, MD  TRELEGY ELLIPTA 100-62.5-25 MCG/ACT AEPB Inhale 1 puff into the lungs daily. 01/22/24  Yes [provider]    Family History Family History  Problem Relation Age of Onset   Heart disease Mother    Diabetes Father    Cancer Daughter        breast ca x 2 s/p mastectomy     Social History Social History   Tobacco Use   Smoking status: Former    Current packs/day: 0.00    Average packs/day: 0.5 packs/day for 20.0 years (10.0 ttl pk-yrs)    Types: Cigarettes    Start date: 09/20/1975    Quit date: 09/20/1995    Years since quitting: 28.4   Smokeless tobacco: Never   Tobacco comments:    quit 1996 smoked 20 years max 8 cig qd   Vaping Use   Vaping status: Never Used  Substance Use Topics    Alcohol use: No   Drug use: No     Allergies   Penicillins, Sulfa antibiotics, Amitiza [lubiprostone], Aspirin, and Penicillin g   Review of Systems Review of Systems: :negative unless otherwise stated in HPI.      Physical Exam Triage Vital Signs ED Triage Vitals [02/04/24 1123]  Encounter Vitals Group     BP 127/62     Systolic BP Percentile      Diastolic BP Percentile      Pulse Rate 60     Resp 14     Temp 98.5 F (36.9 C)     Temp Source Oral     SpO2 94 %     Weight      Height      Head Circumference      Peak Flow      Pain Score      Pain Loc      Pain Education      Exclude from Growth Chart    No data found.  Updated Vital Signs BP 127/62 (BP Location: Left Arm)   Pulse 60   Temp 98.5 F (36.9 C) (Oral)   Resp 14   LMP  (LMP Unknown)   SpO2 94%   Visual Acuity Right Eye Distance:   Left Eye Distance:   Bilateral Distance:  Right Eye Near:   Left Eye Near:    Bilateral Near:     Physical Exam GEN: well appearing female in no acute distress  CVS: well perfused  RESP: speaking in full sentences without pause      UC Treatments / Results  Labs (all labs ordered are listed, but only abnormal results are displayed) Labs Reviewed  URINE CULTURE - Abnormal; Notable for the following components:      Result Value   Culture   (*)    Value: >=100,000 COLONIES/mL GROUP B STREP(S.AGALACTIAE)ISOLATED TESTING AGAINST S. AGALACTIAE NOT ROUTINELY PERFORMED DUE TO PREDICTABILITY OF AMP/PEN/VAN SUSCEPTIBILITY. Performed at [facility], [address] [place], [place] [address]    All other components within normal limits  URINALYSIS, W/ REFLEX TO CULTURE (INFECTION SUSPECTED) - Abnormal; Notable for the following components:   Color, Urine AMBER (*)    APPearance HAZY (*)    Hgb urine dipstick MODERATE (*)    Leukocytes,Ua SMALL (*)    Bacteria, UA FEW (*)    All other components within normal limits    EKG   Radiology US  Pelvic Complete With Transvaginal Result Date: 02/06/2024 CLINICAL DATA:  Vaginal bleeding EXAM: TRANSABDOMINAL AND TRANSVAGINAL ULTRASOUND OF PELVIS TECHNIQUE: Both transabdominal and transvaginal ultrasound examinations of the pelvis were performed. Transabdominal technique was performed for global imaging of the pelvis including uterus, ovaries, adnexal regions, and pelvic cul-de-sac. It was necessary to proceed with endovaginal exam following the transabdominal exam to visualize the uterus endometrium adnexa. COMPARISON:  CT 03/03/2018 FINDINGS: Uterus Measurements: 4.6 x 2.8 x 3.5 cm = volume: 20.8 mL. No fibroids or other mass visualized. Endometrium Thickness: 3 mm.  No focal abnormality visualized. Right ovary Not seen Left ovary Not seen Other findings No abnormal free fluid. IMPRESSION: 1. Endometrial thickness of 3 mm. In the setting of post-menopausal bleeding, this is consistent with a benign etiology such as endometrial atrophy. If bleeding remains unresponsive to hormonal or medical therapy, sonohysterogram should be considered for focal lesion work-up. (Ref: Radiological Reasoning: Algorithmic Workup of Abnormal Vaginal Bleeding with Endovaginal Sonography and Sonohysterography. AJR 2008; [id number]) 2. Nonvisualized ovaries Electronically Signed   By: Jasmine Pang M.D.   On: 02/06/2024 19:33    Procedures Procedures (including critical care time)  Medications Ordered in UC Medications - No data to display  Initial Impression / Assessment and Plan / UC Course  I have reviewed the triage vital signs and the nursing notes.  Pertinent labs & imaging results that were available during my care of the patient were reviewed by me and considered in my medical decision making (see chart for details).        Acute cystitis:  Patient is a 89 y.o. female  who presents for dysuria and hematuria vs vaginal bleeding.  Overall patient is well-appearing and afebrile.  Vital signs stable.  UA  consistent with acute cystitis.  Hematuria supported on microscopy.Treat with Cefdinir 2 times daily for 7 days.  Urine culture obtained.  Follow-up sensitivities and change antibiotics, if needed.   Pt has appointment with OB-GYN next week for concern for possible vaginal bleeding. Return precautions including abdominal pain, fever, chills, nausea, or vomiting given. Follow-up,  if symptoms not improving or getting worse. Discussed MDM, treatment plan and plan for follow-up with patient and her neighbor who agree with plan.        Final Clinical Impressions(s) / UC Diagnoses   Final diagnoses:  Acute cystitis with hematuria  Discharge Instructions      Stop by the pharmacy to pick up your prescriptions.  Follow up with your primary care provider or return to the urgent care, if not improving.   I sent your urine for culture, someone may call to change antibiotics based of your urine culture results.       ED Prescriptions     Medication Sig Dispense Auth. Provider   cefdinir (OMNICEF) 300 MG capsule Take 1 capsule (300 mg total) by mouth 2 (two) times daily. 14 capsule Zuleima Haser, DO      PDMP not reviewed this encounter.   Katha Cabal, DO 02/08/24 1335

## 2024-02-11 ENCOUNTER — Other Ambulatory Visit: Payer: Self-pay | Admitting: *Deleted

## 2024-02-11 ENCOUNTER — Other Ambulatory Visit: Payer: Self-pay

## 2024-02-11 ENCOUNTER — Telehealth: Payer: Self-pay

## 2024-02-11 ENCOUNTER — Encounter: Payer: Self-pay | Admitting: Oncology

## 2024-02-11 ENCOUNTER — Telehealth: Payer: Self-pay | Admitting: Internal Medicine

## 2024-02-11 DIAGNOSIS — R310 Gross hematuria: Secondary | ICD-10-CM

## 2024-02-11 DIAGNOSIS — R319 Hematuria, unspecified: Secondary | ICD-10-CM | POA: Insufficient documentation

## 2024-02-11 DIAGNOSIS — N950 Postmenopausal bleeding: Secondary | ICD-10-CM

## 2024-02-11 NOTE — Assessment & Plan Note (Signed)
-  Patient noted gross hematuria while on Eliquis over the last couple weeks -We had held her Eliquis and her symptoms have resolved -We will obtain a CT of her abdomen pelvis for further evaluation -This was discussed with her son who is in agreement with plan.

## 2024-02-11 NOTE — Telephone Encounter (Signed)
 Called Cletis Athens (Patient's son) to let him know that the Patient is scheduled to see [facility] on 02/20/24 at 1:55 and that DR. Heide Spark ordered a CT of her abdomen & pelvis.

## 2024-02-11 NOTE — Addendum Note (Signed)
 Addended by: Prince Solian A on: 02/11/2024 11:38 AM   Modules accepted: Orders

## 2024-02-11 NOTE — Addendum Note (Signed)
 Addended by: Prince Solian A on: 02/11/2024 10:22 AM   Modules accepted: Orders

## 2024-02-11 NOTE — Telephone Encounter (Signed)
 [place] to make sure he is aware of the CT scan on 02/19/24 at 1:30 at [facility] [address].

## 2024-02-11 NOTE — Telephone Encounter (Signed)
 I called the patient to discuss the results of her ultrasound and blood work with her.  I spoke with her son who is her guardian.  The son states that she has had no further bleeding since stopping her Eliquis.  Her transvaginal ultrasound showed endometrial atrophy which could be the source of her bleeding.  Patient's hemoglobin was stable at 11.9.  Patient's BMP showed an elevated creatinine of 1.36 which is similar to her baseline (CKD stage IIIb).  Given that the patient had hematuria as well I will obtain a CT abdomen/pelvis for further evaluation.  However, I do believe that this is likely secondary to her vaginal bleeding.  Patient's Eliquis is currently on hold till she follows up with gynecology.  However, son states that he cannot get an appointment with gynecology till June.  Our office will contact the gynecology office to see if we can get her in sooner.  The son expresses understanding and is in agreement with plan.

## 2024-02-12 ENCOUNTER — Ambulatory Visit
Admission: RE | Admit: 2024-02-12 | Discharge: 2024-02-12 | Disposition: A | Discharge status: Home / Self Care | Payer: Medicare PPO | Source: Ambulatory Visit | Attending: Pulmonary Disease | Admitting: Pulmonary Disease

## 2024-02-12 DIAGNOSIS — J849 Interstitial pulmonary disease, unspecified: Secondary | ICD-10-CM | POA: Diagnosis not present

## 2024-02-12 DIAGNOSIS — J4489 Other specified chronic obstructive pulmonary disease: Secondary | ICD-10-CM | POA: Diagnosis not present

## 2024-02-12 DIAGNOSIS — J439 Emphysema, unspecified: Secondary | ICD-10-CM | POA: Insufficient documentation

## 2024-02-12 DIAGNOSIS — J9811 Atelectasis: Secondary | ICD-10-CM | POA: Diagnosis not present

## 2024-02-12 DIAGNOSIS — R911 Solitary pulmonary nodule: Secondary | ICD-10-CM | POA: Diagnosis not present

## 2024-02-15 ENCOUNTER — Encounter: Payer: Self-pay | Admitting: Pulmonary Disease

## 2024-02-18 NOTE — Telephone Encounter (Signed)
 She has the Budesonide, Rosalyn Gess and Yupelri.

## 2024-02-18 NOTE — Telephone Encounter (Signed)
 See Mychart Message from 3/14.  Closing this encounter.

## 2024-02-19 ENCOUNTER — Telehealth: Payer: Self-pay | Admitting: Pulmonary Disease

## 2024-02-19 ENCOUNTER — Ambulatory Visit
Admission: RE | Admit: 2024-02-19 | Discharge: 2024-02-19 | Disposition: A | Discharge status: Home / Self Care | Source: Ambulatory Visit | Attending: Internal Medicine | Admitting: Internal Medicine

## 2024-02-19 DIAGNOSIS — R310 Gross hematuria: Secondary | ICD-10-CM | POA: Diagnosis not present

## 2024-02-19 DIAGNOSIS — N950 Postmenopausal bleeding: Secondary | ICD-10-CM | POA: Diagnosis not present

## 2024-02-19 DIAGNOSIS — Q630 Accessory kidney: Secondary | ICD-10-CM | POA: Diagnosis not present

## 2024-02-19 DIAGNOSIS — Z9049 Acquired absence of other specified parts of digestive tract: Secondary | ICD-10-CM | POA: Diagnosis not present

## 2024-02-19 MED ORDER — IOHEXOL 300 MG/ML  SOLN
80.0000 mL | Freq: Once | INTRAMUSCULAR | Status: AC | PRN
  Administered 2024-02-19: 80 mL via INTRAVENOUS

## 2024-02-19 NOTE — Telephone Encounter (Signed)
 Pt's caregiver is asking for a schedule for taking the medications. Is also asking for it to be placed in mychart so other caregivers have access

## 2024-02-20 ENCOUNTER — Telehealth: Payer: Self-pay

## 2024-02-20 ENCOUNTER — Encounter: Payer: Self-pay | Admitting: Obstetrics

## 2024-02-20 ENCOUNTER — Ambulatory Visit: Admitting: Obstetrics

## 2024-02-20 VITALS — BP 125/49 | HR 64 | Ht 67.0 in | Wt 182.0 lb

## 2024-02-20 DIAGNOSIS — Z86718 Personal history of other venous thrombosis and embolism: Secondary | ICD-10-CM | POA: Diagnosis not present

## 2024-02-20 DIAGNOSIS — Z7901 Long term (current) use of anticoagulants: Secondary | ICD-10-CM

## 2024-02-20 DIAGNOSIS — N950 Postmenopausal bleeding: Secondary | ICD-10-CM | POA: Diagnosis not present

## 2024-02-20 DIAGNOSIS — N858 Other specified noninflammatory disorders of uterus: Secondary | ICD-10-CM | POA: Diagnosis not present

## 2024-02-20 NOTE — Telephone Encounter (Signed)
 Called Jessica Erickson to let him know that the Patient can start the Eliquis back. If she has excessive bleeding again to call the office and see if the PCP wants to stop the Eliquis again. I also advised Jessica Erickson that if the Patient bleeds through a maxi pad in less than 30 minutes for over an hour then seek medical attention immediately. Jessica Erickson understands and is agreeable. I let Jessica Erickson know that GYN states the bleeding is due to her cervix being so thin.

## 2024-02-20 NOTE — Progress Notes (Signed)
 GYNECOLOGY PROGRESS NOTE: NEW PATIENT  Subjective:  PCP: Glori Luis, MD (Inactive) Referring: Earl Lagos, MD at [facility]   Patient ID: Jessica Erickson, female    DOB: [birth year], 89 y.o.   MRN: [id number]  HPI  Patient is a 89 y.o. [id number] female who presents with her caregiver as a referral from Dr. Heide Spark with [facility] for postmenopausal bleeding. This bleeding started on 3/2 and continued until 3/16, has now stopped on its own. Was seen by PCP on 3/5, at that time per the note, her caregiver reported pt was going through 3-4 pads/day, there were some clots. Caregiver shows pictures on phone today of the pad and blood in toilet with a small clot. They report today that the bleeding has stopped without any medication or treatment. Pt takes Eliquis for hx of DVT, this was stopped by PCP sometime between 3/5 and 3/10. PCP obtained pelvic US, EMT 3mm without any other findings.   Past Medical History:  Diagnosis Date   Acoustic neuroma (HCC)    Allergy    Asthma    Bilateral swelling of feet    and legs   Bladder infection    CAD (coronary artery disease)    Cataract    Change in voice    Compression fracture of body of thoracic vertebra (HCC)    T12 09/18/15 MRI s/p fall    Constipation    COPD (chronic obstructive pulmonary disease) (HCC)    previous CXR with chronic interstitial lung dz    CVA (cerebral vascular accident) (HCC)    Depression    Diabetes (HCC)    with neuropathy   Diabetes mellitus, type 2 (HCC)    Diarrhea    Double vision    DVT (deep venous thrombosis) (HCC)    right leg 10/2015 was on coumadin off as of 2017/2018 ; s/p IVC filter   Enuresis    Eye pain, right    Fall    Fatty liver    09/15/15 also mildly dilated pancreatitic duct rec MRCP small sub cm cyst hemangioma speeln mild right hydronephrorossi and prox. hydroureter, kidney stones, mild scarring kidneys   Female stress incontinence    Flank pain     GERD (gastroesophageal reflux disease)    with small hiatal hernia    Hard of hearing    Heart disease    History of kidney problems    Hyperlipidemia    mixed   Hypertension    Hypothyroidism, postsurgical    Impaired mobility and ADLs    uses rolling walker has caretaker 24/7 at home   Leg edema    Mixed incontinence urge and stress (female)(female)    Neuropathy    Osteoarthritis    DDD spine    Osteoporosis with fracture    T12 compression fracture   Photophobia    Pulmonary embolism (HCC)    10/2015 off coumadin as of 04/2016   Pulmonary HTN (HCC)    mild pulm HTN, echo 10/09/15 EF 55-60%grade 1 dd, RV systolic pressure increased    Recurrent UTI    Sinus pressure    Skin cancer    BCC jawline and scalp    Thyroid disease    follows [facility]   TIA (transient ischemic attack)    MRI 2009/2010 neg stroke    Trigeminal neuralgia    Dr. Cammie Mcgee s/p gamma knife x 2, on Tegretol since 2011/2012 no increase in dose >200 mg bid  rec per family per neurology    Urinary frequency    Urinary, incontinence, stress female    Dr Apolinar Junes urology    Past Surgical History:  Procedure Laterality Date   APPENDECTOMY     as a child, open   BRAIN SURGERY     schwnnoma removal 1996    brain tumor surgery     BREAST SURGERY     breast bx   CATARACT EXTRACTION     CHOLECYSTECTOMY     EYE SURGERY     cataract   IVC FILTER PLACEMENT ([facility] HX)     Dr. Wyn Quaker 10/2015    LAPAROSCOPIC TUBAL LIGATION     MOHS SURGERY     scalp 04/2014    PERIPHERAL VASCULAR CATHETERIZATION N/A 10/11/2015   Procedure: IVC Filter Insertion;  Surgeon: Annice Needy, MD;  Location: [facility] INVASIVE CV LAB;  Service: Cardiovascular;  Laterality: N/A;   PERIPHERAL VASCULAR THROMBECTOMY Bilateral 03/29/2018   Procedure: PERIPHERAL VASCULAR THROMBECTOMY;  Surgeon: Annice Needy, MD;  Location: [facility] INVASIVE CV LAB;  Service: Cardiovascular;  Laterality: Bilateral;   PUBOVAGINAL SLING     THROAT SURGERY     THYROID  SURGERY     tumor around vocal cords    TOOTH EXTRACTION     winter 2018    TOTAL THYROIDECTOMY  1976   Family History  Problem Relation Age of Onset   Heart disease Mother    Diabetes Father    Cancer Daughter        breast ca x 2 s/p mastectomy    Social History   Socioeconomic History   Marital status: Widowed    Spouse name: Not on file   Number of children: 5   Years of education: Not on file   Highest education level: Some college, no degree  Occupational History   Occupation: retired  Tobacco Use   Smoking status: Former    Current packs/day: 0.00    Average packs/day: 0.5 packs/day for 20.0 years (10.0 ttl pk-yrs)    Types: Cigarettes    Start date: 09/20/1975    Quit date: 09/20/1995    Years since quitting: 28.4   Smokeless tobacco: Never   Tobacco comments:    quit 1996 smoked 20 years max 8 cig qd   Vaping Use   Vaping status: Never Used  Substance and Sexual Activity   Alcohol use: No   Drug use: No   Sexual activity: Not on file  Other Topics Concern   Not on file  Social History Narrative   Lives at home with Jonny Ruiz son and caretakers come to house    Has children    Likes to ride stationary bike and sew    [name] lives with as of 2022 and he is POA youngest son            Social Drivers of Corporate investment banker Strain: Low Risk  (01/21/2024)   Overall Financial Resource Strain (CARDIA)    Difficulty of Paying Living Expenses: Not hard at all  Food Insecurity: No Food Insecurity (01/21/2024)   Hunger Vital Sign    Worried About Running Out of Food in the Last Year: Never true    Ran Out of Food in the Last Year: Never true  Transportation Needs: No Transportation Needs (01/21/2024)   PRAPARE - Administrator, Civil Service (Medical): No    Lack of Transportation (Non-Medical): No  Physical Activity: Inactive (01/21/2024)   Exercise  Vital Sign    Days of Exercise per Week: 0 days    Minutes of Exercise per Session: 0 min   Stress: No Stress Concern Present (01/21/2024)   [employer] of Occupational Health - Occupational Stress Questionnaire    Feeling of Stress : Not at all  Social Connections: Moderately Isolated (01/21/2024)   Social Connection and Isolation Panel [NHANES]    Frequency of Communication with Friends and Family: More than three times a week    Frequency of Social Gatherings with Friends and Family: More than three times a week    Attends Religious Services: Never    Database administrator or Organizations: Yes    Attends Banker Meetings: Never    Marital Status: Widowed  Intimate Partner Violence: Not At Risk (01/21/2024)   Humiliation, Afraid, Rape, and Kick questionnaire    Fear of Current or Ex-Partner: No    Emotionally Abused: No    Physically Abused: No    Sexually Abused: No   Current Outpatient Medications on File Prior to Visit  Medication Sig Dispense Refill   Accu-Chek Softclix Lancets lancets Use as instructed 100 each 12   acetaminophen (TYLENOL) 325 MG tablet Take 650 mg by mouth 2 (two) times daily as needed for mild pain (pain score 1-3).     apixaban (ELIQUIS) 2.5 MG TABS tablet Take 1 tablet (2.5 mg total) by mouth 2 (two) times daily. 180 tablet 3   arformoterol (BROVANA) 15 MCG/2ML NEBU Take 2 mLs (15 mcg total) by nebulization 2 (two) times daily. 120 mL 6   atorvastatin (LIPITOR) 40 MG tablet Take 1 tablet (40 mg total) by mouth daily. 90 tablet 3   bisacodyl (DULCOLAX) 5 MG EC tablet Take 5 mg by mouth daily as needed for moderate constipation.     Blood Glucose Monitoring Suppl (ACCU-CHEK AVIVA PLUS) w/Device KIT Use as directed 1 kit 0   budesonide (PULMICORT) 0.25 MG/2ML nebulizer solution Take 2 mLs (0.25 mg total) by nebulization 2 (two) times daily. 120 mL 5   calcium-vitamin D (OSCAL WITH D) 500-5 MG-MCG tablet Take 1 tablet by mouth 2 (two) times daily. Unable to find former dose 600ca/400 vitamin D inform patient dose change Oscal 180  tablet 3   cefdinir (OMNICEF) 300 MG capsule Take 1 capsule (300 mg total) by mouth 2 (two) times daily. 14 capsule 0   cetirizine (ZYRTEC) 10 MG tablet TAKE 1 TABLET BY MOUTH DAILY. 90 tablet 3   docusate sodium (COLACE) 100 MG capsule Take 100 mg by mouth 2 (two) times daily.     feeding supplement, GLUCERNA SHAKE, (GLUCERNA SHAKE) LIQD Take 237 mLs by mouth 2 (two) times daily between meals.  0   furosemide (LASIX) 40 MG tablet TAKE ONE TABLET (40MG ) DAILY. MAY TAKE ASECOND DOSE OF 40MG  IF NEEDED AT LUNCH FOR 5 DAYS 120 tablet 3   gabapentin (NEURONTIN) 100 MG capsule TAKE 2 CAPSULES BY MOUTH 3 TIMES DAILY. 504 capsule 3   glucose blood test strip Use to test blood sugar twice daily. 200 each 1   guaiFENesin (MUCINEX) 600 MG 12 hr tablet Take 2 tablets (1,200 mg total) by mouth 2 (two) times daily as needed for cough or to loosen phlegm. (Patient taking differently: Take 600 mg by mouth 2 (two) times daily as needed for cough or to loosen phlegm.)     ipratropium-albuterol (DUONEB) 0.5-2.5 (3) MG/3ML SOLN TAKE 3 ML BY NEBULIZATION EVERY 4 HOURS AS NEEDED 360 mL 11  levothyroxine (SYNTHROID) 150 MCG tablet TAKE ONE TABLET BY MOUTH EVERY DAY BEFORE BREAKFAST 90 tablet 1   Lidocaine-Menthol (ICY HOT MAX LIDOCAINE) 4-1 % CREA Apply 1 application. topically 3 (three) times daily with meals as needed. Dispense cream or spray pt preference 120 g 11   Melatonin 10 MG TABS Take 10 mg by mouth at bedtime. 90 tablet 3   mirtazapine (REMERON) 15 MG tablet Take 1 tablet (15 mg total) by mouth at bedtime. 90 tablet 3   Multiple Vitamin (MULTIVITAMIN WITH MINERALS) TABS tablet Take 1 tablet by mouth daily.      OXYGEN Inhale 2 L/min into the lungs continuous.     pantoprazole (PROTONIX) 40 MG tablet TAKE ONE TABLET BY MOUTH DAILY 30 MINUTES BEFORE LUNCH OR DINNER 90 tablet 3   polyethylene glycol powder (GLYCOLAX/MIRALAX) 17 GM/SCOOP powder Take 17 g by mouth daily. 255 g 11   polyvinyl alcohol (LIQUIFILM  TEARS) 1.4 % ophthalmic solution Place 1 drop into both eyes at bedtime. 15 mL 0   revefenacin (YUPELRI) 175 MCG/3ML nebulizer solution Take 3 mLs (175 mcg total) by nebulization daily. 90 mL 6   TRELEGY ELLIPTA 100-62.5-25 MCG/ACT AEPB Inhale 1 puff into the lungs daily.     No current facility-administered medications on file prior to visit.   Allergies  Allergen Reactions   Penicillins Shortness Of Breath, Rash and Other (See Comments)    Has patient had a PCN reaction causing immediate rash, facial/tongue/throat swelling, SOB or lightheadedness with hypotension: Yes Has patient had a PCN reaction causing severe rash involving mucus membranes or skin necrosis: No Has patient had a PCN reaction that required hospitalization No Has patient had a PCN reaction occurring within the last 10 years: No If all of the above answers are "NO", then may proceed with Cephalosporin use.   Sulfa Antibiotics Shortness Of Breath, Rash and Other (See Comments)   Amitiza [Lubiprostone]     N/v/d   Aspirin Other (See Comments)    Reaction:  Unknown  Other reaction(s): Bleeding (intolerance) Per patient " causes nose to bleed" Can take 81 mg daily without any complications Other reaction(s): "bloody nose"    Penicillin G Other (See Comments)    Has patient had a PCN reaction causing immediate rash, facial/tongue/throat swelling, SOB or lightheadedness with hypotension: No Has patient had a PCN reaction causing severe rash involving mucus membranes or skin necrosis: Unknown Has patient had a PCN reaction that required hospitalization: Unknown Has patient had a PCN reaction occurring within the last 10 years: Unknown If all of the above answers are "NO", then may proceed with Cephalosporin use.    Review of Systems Pertinent items are noted in HPI.   Objective:   Blood pressure (!) 125/49, pulse 64, height 5\' 7"  (1.702 m), weight 182 lb (82.6 kg). Body mass index is 28.51 kg/m.  General appearance:  alert, cooperative, no distress, and hard of hearing, need to write on white board for communication, has walker Abdomen: soft, non-tender; bowel sounds normal; no masses,  no organomegaly Pelvic: cervix normal in appearance, external genitalia normal, no adnexal masses or tenderness, no cervical motion tenderness, rectovaginal septum normal, uterus normal size, shape, and consistency, vagina normal without discharge, and no blood in vault.  Neurologic: Grossly normal  CLINICAL DATA:  Vaginal bleeding   EXAM: TRANSABDOMINAL AND TRANSVAGINAL ULTRASOUND OF PELVIS   TECHNIQUE: Both transabdominal and transvaginal ultrasound examinations of the pelvis were performed. Transabdominal technique was performed for global imaging of the  pelvis including uterus, ovaries, adnexal regions, and pelvic cul-de-sac. It was necessary to proceed with endovaginal exam following the transabdominal exam to visualize the uterus endometrium adnexa.   COMPARISON:  CT 03/03/2018   FINDINGS: Uterus   Measurements: 4.6 x 2.8 x 3.5 cm = volume: 20.8 mL. No fibroids or other mass visualized.   Endometrium Thickness: 3 mm.  No focal abnormality visualized.   Right ovary Not seen   Left ovary Not seen   Other findings No abnormal free fluid.   IMPRESSION: 1. Endometrial thickness of 3 mm. In the setting of post-menopausal bleeding, this is consistent with a benign etiology such as endometrial atrophy. If bleeding remains unresponsive to hormonal or medical therapy, sonohysterogram should be considered for focal lesion work-up. (Ref: Radiological Reasoning: Algorithmic Workup of Abnormal Vaginal Bleeding with Endovaginal Sonography and Sonohysterography. AJR 2008; 562:Z30-86) 2. Nonvisualized ovaries  Assessment/Plan:   1. Postmenopausal vaginal bleeding   2. Atrophic endometrium   3. Blood thinned due to long-term anticoagulant use     Jessica Erickson is a 89 y.o. [id number] with recent and  self-resolving postmenopausal bleeding due to atrophic endometrium, and likely exacerbated by her use of daily Eliquis for hx of DVT.  -Reviewed benign nature of this type of bleeding, with EMT of 3mm, no concern for endometrial malignancy and does not need endometrial biopsy.  -We are ok with patient restarting Eliquis under direction of her PCP; aware this may re-trigger the bleeding. Bleeding precautions discussed, and if excessive, should notify PCP and consider stopping anticoagulant again. Seek emergency care if bleeding exceeds soaking through a Maxi pad in < 30 minutes for over 1 hour.  -Follow up as needed.    Julieanne Manson, DO [facility]

## 2024-02-22 ENCOUNTER — Ambulatory Visit: Payer: Medicare PPO | Admitting: Podiatry

## 2024-02-22 ENCOUNTER — Encounter: Payer: Self-pay | Admitting: Podiatry

## 2024-02-22 VITALS — Ht 67.0 in | Wt 182.0 lb

## 2024-02-22 DIAGNOSIS — B351 Tinea unguium: Secondary | ICD-10-CM | POA: Diagnosis not present

## 2024-02-22 DIAGNOSIS — E1140 Type 2 diabetes mellitus with diabetic neuropathy, unspecified: Secondary | ICD-10-CM

## 2024-02-22 DIAGNOSIS — M79676 Pain in unspecified toe(s): Secondary | ICD-10-CM

## 2024-02-25 ENCOUNTER — Telehealth: Payer: Self-pay

## 2024-02-25 NOTE — Telephone Encounter (Signed)
-----   Message from Earl Lagos sent at 02/25/2024  8:26 AM EDT ----- Can we let this patient know it is ok to restart her Eliquis? If the bleeding recurs she will need to hold the Eliquis and follow up with [place].  Thank you! ----- Message ----- From: Julieanne Manson, MD Sent: 02/20/2024   2:19 PM EDT To: Earl Lagos, MD

## 2024-02-25 NOTE — Telephone Encounter (Signed)
 Attempted to call Patient- no answer and voicemail is full. The CT scan has also been approved.

## 2024-02-25 NOTE — Progress Notes (Signed)
 Attempted to call Patient- no answer and voicemail is full. The CT has been approved also. Okay to give message to Patient.

## 2024-02-26 ENCOUNTER — Encounter: Payer: Self-pay | Admitting: Pulmonary Disease

## 2024-02-27 ENCOUNTER — Other Ambulatory Visit: Payer: Self-pay

## 2024-02-27 DIAGNOSIS — G4700 Insomnia, unspecified: Secondary | ICD-10-CM

## 2024-02-27 DIAGNOSIS — F32A Depression, unspecified: Secondary | ICD-10-CM

## 2024-02-27 MED ORDER — MIRTAZAPINE 15 MG PO TABS: 15.0000 mg | ORAL_TABLET | Freq: Every day | ORAL | 0 refills | Status: DC

## 2024-02-29 NOTE — Progress Notes (Signed)
 Subjective:  Patient ID: Jessica Erickson, female    DOB: 13-Apr-1926,  MRN: [id number]  89 y.o. female presents at risk foot care with history of diabetic neuropathy and painful, elongated thickened toenails x 10 which are symptomatic when wearing enclosed shoe gear. This interferes with his/her daily activities. Chief Complaint  Patient presents with   Nail Problem    Pt is here for [facility] PCP is Dr Birdie Sons and LOV was in November.   New problem(s): None   Allergies  Allergen Reactions   Penicillins Shortness Of Breath, Rash and Other (See Comments)    Has patient had a PCN reaction causing immediate rash, facial/tongue/throat swelling, SOB or lightheadedness with hypotension: Yes Has patient had a PCN reaction causing severe rash involving mucus membranes or skin necrosis: No Has patient had a PCN reaction that required hospitalization No Has patient had a PCN reaction occurring within the last 10 years: No If all of the above answers are "NO", then may proceed with Cephalosporin use.   Sulfa Antibiotics Shortness Of Breath, Rash and Other (See Comments)   Amitiza [Lubiprostone]     N/v/d   Aspirin Other (See Comments)    Reaction:  Unknown  Other reaction(s): Bleeding (intolerance) Per patient " causes nose to bleed" Can take 81 mg daily without any complications Other reaction(s): "bloody nose"    Penicillin G Other (See Comments)    Has patient had a PCN reaction causing immediate rash, facial/tongue/throat swelling, SOB or lightheadedness with hypotension: No Has patient had a PCN reaction causing severe rash involving mucus membranes or skin necrosis: Unknown Has patient had a PCN reaction that required hospitalization: Unknown Has patient had a PCN reaction occurring within the last 10 years: Unknown If all of the above answers are "NO", then may proceed with Cephalosporin use.   Review of Systems: Negative except as noted in the HPI.   Objective:  Jessica Erickson is a  pleasant 89 y.o. female in NAD. AAO x 3.  Vascular Examination: Vascular status intact b/l with palpable pedal pulses. CFT immediate b/l. Pedal hair present. No edema. No pain with calf compression b/l. Skin temperature gradient WNL b/l. No varicosities noted. No cyanosis or clubbing noted.  Neurological Examination: Pt has subjective symptoms of neuropathy. Sensation grossly intact b/l with 10 gram monofilament.   Dermatological Examination: Pedal skin with normal turgor, texture and tone b/l. No open wounds nor interdigital macerations noted. Toenails 1-5 b/l thick, discolored, elongated with subungual debris and pain on dorsal palpation. No corns, calluses nor porokeratotic lesions noted.  Musculoskeletal Examination: Muscle strength 5/5 to b/l LE.  No pain, crepitus noted b/l. No gross pedal deformities. Patient ambulates independently without assistive aids.   Radiographs: None  Last A1c:      Latest Ref Rng & Units 04/02/2023    2:58 PM  Hemoglobin A1C  Hemoglobin-A1c 4.6 - 6.5 % 6.0    Assessment:   1. Pain due to onychomycosis of toenail   2. Type 2 diabetes mellitus with diabetic neuropathy, without long-term current use of insulin (HCC)    Plan:  Consent given for treatment. Patient examined. All patient's and/or POA's questions/concerns addressed on today's visit. Mycotic toenails 1-5 debrided in length and girth without incident. Monitor blood glucose per PCP/Endocrinologist's recommendations.Continue soft, supportive shoe gear daily. Report any pedal injuries to medical professional. Call office if there are any quesitons/concerns. -Patient/POA to call should there be question/concern in the interim.  Return in about 9 weeks (around 04/25/2024).  [name], DPM      [place] LOCATION: [address], [place] [address]                   Office [phone]   [facility] LOCATION: [address] [place], [place] [address] Office [phone]

## 2024-03-03 NOTE — Progress Notes (Signed)
 Spoke with pt's son and he is aware and stated that they have already started back on the Eliquis.

## 2024-03-05 ENCOUNTER — Ambulatory Visit: Admitting: Pulmonary Disease

## 2024-03-05 ENCOUNTER — Telehealth

## 2024-03-05 VITALS — BP 120/70 | HR 70 | Temp 97.1°F | Ht 67.0 in | Wt 182.0 lb

## 2024-03-05 DIAGNOSIS — R0602 Shortness of breath: Secondary | ICD-10-CM

## 2024-03-05 DIAGNOSIS — Z0100 Encounter for examination of eyes and vision without abnormal findings: Secondary | ICD-10-CM | POA: Diagnosis not present

## 2024-03-05 DIAGNOSIS — H01003 Unspecified blepharitis right eye, unspecified eyelid: Secondary | ICD-10-CM | POA: Diagnosis not present

## 2024-03-05 DIAGNOSIS — H34232 Retinal artery branch occlusion, left eye: Secondary | ICD-10-CM | POA: Diagnosis not present

## 2024-03-05 DIAGNOSIS — D23111 Other benign neoplasm of skin of right upper eyelid, including canthus: Secondary | ICD-10-CM | POA: Diagnosis not present

## 2024-03-05 DIAGNOSIS — E119 Type 2 diabetes mellitus without complications: Secondary | ICD-10-CM | POA: Diagnosis not present

## 2024-03-05 LAB — HM DIABETES EYE EXAM

## 2024-03-05 NOTE — Telephone Encounter (Signed)
 Copied from CRM [id number]. Topic: General - Other >> Mar 05, 2024  1:53 PM Renie Ora wrote: Reason for CRM: Patient caregiver Annice Pih [phone] called in requesting if handicap sticker paperwork can be filled out, she stated the patient was seen on today and they forgot to give the paperwork while in office.

## 2024-03-05 NOTE — Telephone Encounter (Signed)
 The Patient was not seen in our clinic today but does look like she was seen by Pulmonology. Dr. Heide Spark only sees acute visits so I do not know who listed him as her PCP but that needs to be fixed.

## 2024-03-05 NOTE — Telephone Encounter (Signed)
 Would you mind signing a handicap Placard for patient she does not establish with [facility] until July was a [facility] patient.

## 2024-03-05 NOTE — Telephone Encounter (Signed)
 I started filling out the handicap placard.

## 2024-03-05 NOTE — Telephone Encounter (Signed)
 Filled out handicap placard form and gave to Dr. Darrick Huntsman to sign.

## 2024-03-05 NOTE — Progress Notes (Signed)
 Synopsis: Referred in by Glori Luis, MD   Subjective:   PATIENT ID: Jessica Erickson GENDER: female DOB: 1926/05/25, MRN: [id number]  Chief Complaint  Patient presents with   Follow-up    HPI Jessica Erickson is a 89 year old female patient with a past medical history of COPD, Bronchiectasis, CAD, CVA type II DM presenting today to the pulmonary clinic for ongoing cough and sputum production.   History taken mainly from her son who was with her today. She is deaf and only communicates via writing. She walks independently with a walker.   She was hospitalized in October for MRSA pneumonia and was treated with Vancomycin and discharged home. She receives in house care.   She did well however still with cough and sputum production. She carries the diagnosis of COPD and has been on Duonebs.   CT chest 09/26/2023 Filling of the right bronchus intermedius with RML and RLL consolidations. Similar opacity seen on the left lower lobe.   CT chest 02/12/2024 - Improved consolidative opacities, Bronchiectasis persistent specifically at the base. Enlarged subcarinal node.  Feeling ok today, has no issues with breathing. Still with persistent increase in secretions.    ROS All systems were reviewed and are negative except for the above.  Objective:   There were no vitals filed for this visit.    on RA BMI Readings from Last 3 Encounters:  02/22/24 28.51 kg/m  02/20/24 28.51 kg/m  02/06/24 28.54 kg/m   Wt Readings from Last 3 Encounters:  02/22/24 182 lb (82.6 kg)  02/20/24 182 lb (82.6 kg)  02/06/24 182 lb 3.2 oz (82.6 kg)    Physical Exam GEN: NAD, Healthy Appearing HEENT: Supple Neck, Reactive Pupils, EOMI  CVS: Normal S1, Normal S2, RRR, No murmurs or ES appreciated  Lungs: Ronchi heard bilaterally with faint crackles at the bases.  Abdomen: Soft, non tender, non distended, + BS  Extremities: Warm and well perfused, No edema  Skin: No suspicious lesions appreciated   Psych: Normal Affect  Ancillary Information   CBC    Component Value Date/Time   WBC 6.6 02/06/2024 1512   RBC 3.84 (L) 02/06/2024 1512   HGB 11.9 (L) 02/06/2024 1512   HGB 13.8 06/09/2021 0907   HCT 36.2 02/06/2024 1512   HCT 41.3 06/09/2021 0907   PLT 188.0 02/06/2024 1512   PLT 185 06/09/2021 0907   MCV 94.2 02/06/2024 1512   MCV 90 06/09/2021 0907   MCH 29.4 02/05/2024 1528   MCHC 33.0 02/06/2024 1512   RDW 14.8 02/06/2024 1512   RDW 12.9 06/09/2021 0907   LYMPHSABS 1.7 02/06/2024 1512   LYMPHSABS 1.8 06/09/2021 0907   MONOABS 0.6 02/06/2024 1512   EOSABS 0.2 02/06/2024 1512   EOSABS 0.3 06/09/2021 0907   BASOSABS 0.0 02/06/2024 1512   BASOSABS 0.0 06/09/2021 0907  Labs and imaging were reviewed.       No data to display           Assessment & Plan:  Ms. Lindler is a 89 year old female patient with a past medical history of COPD, Bronchiectasis, CAD, CVA type II DM presenting today to the pulmonary clinic for ongoing cough and sputum production.   #hx of MRSA Pneumonia  Will repeat CT chest in 4 weeks to evaluate for improvement in opacities. Will evaluate for aspiration on next visit.   #Clinical COPD  []  Duonebs as needed.  []  c/w Brovana BID. C/w Yupelri daily.  []  Discontinue Pulmicort.   #  Bronchiectasis likely from prior infections.  Has not been using hypertonic saline. I am ok with discontinuing that. Advised on flutter valve use bid.    No follow-ups on file.  I spent 20 minutes caring for this patient today, including preparing to see the patient, obtaining a medical history , reviewing a separately obtained history, performing a medically appropriate examination and/or evaluation, counseling and educating the patient/family/caregiver, ordering medications, tests, or procedures, documenting clinical information in the electronic health record, and independently interpreting results (not separately reported/billed) and communicating results to the  patient/family/caregiver  Janann Colonel, MD [facility] 03/05/2024 11:29 AM

## 2024-03-06 ENCOUNTER — Telehealth: Payer: Self-pay

## 2024-03-06 ENCOUNTER — Ambulatory Visit: Payer: Medicare PPO | Admitting: Pulmonary Disease

## 2024-03-06 DIAGNOSIS — J449 Chronic obstructive pulmonary disease, unspecified: Secondary | ICD-10-CM | POA: Diagnosis not present

## 2024-03-06 NOTE — Telephone Encounter (Signed)
 I have notified Jessica Erickson.   Nothing further needed.

## 2024-03-06 NOTE — Telephone Encounter (Signed)
 Copied from CRM [id number]. Topic: Clinical - Medical Advice >> Mar 06, 2024  9:42 AM Renie Ora wrote: Reason for CRM: Patiuent caregiver Kendal Hymen [phone] called in to inform office patient needs to have a spit up sputum test done and the patient would like to wait until Tuesday when the caregiver Kendal Hymen will be back at work, Kendal Hymen called to inquire is it okay if patient waits until Tuesday when she is back at work to have test performed.

## 2024-03-06 NOTE — Telephone Encounter (Signed)
 Called and spoke w/ pts son informing him that her disability placard form has been signed by Dr. Darrick Huntsman and ready for pick up forms have been placed in an envelope and placed in designated pick up area

## 2024-03-11 ENCOUNTER — Other Ambulatory Visit
Admission: RE | Admit: 2024-03-11 | Discharge: 2024-03-11 | Disposition: A | Discharge status: Home / Self Care | Source: Ambulatory Visit | Attending: Pulmonary Disease | Admitting: Pulmonary Disease

## 2024-03-11 ENCOUNTER — Other Ambulatory Visit: Payer: Self-pay | Admitting: Family Medicine

## 2024-03-11 ENCOUNTER — Telehealth: Payer: Self-pay

## 2024-03-11 DIAGNOSIS — Z86718 Personal history of other venous thrombosis and embolism: Secondary | ICD-10-CM

## 2024-03-11 DIAGNOSIS — E039 Hypothyroidism, unspecified: Secondary | ICD-10-CM

## 2024-03-11 DIAGNOSIS — R0602 Shortness of breath: Secondary | ICD-10-CM | POA: Insufficient documentation

## 2024-03-11 LAB — EXPECTORATED SPUTUM ASSESSMENT W GRAM STAIN, RFLX TO RESP C

## 2024-03-11 NOTE — Telephone Encounter (Signed)
 Copied from CRM [id number]. Topic: Clinical - Lab/Test Results >> Mar 11, 2024 10:39 AM Theodis Sato wrote: Reason for CRM: Angelica Chessman from the [facility] lab states that the sputum specimen was "not acceptable" and could not run the test. For any questions please call  [phone]

## 2024-03-11 NOTE — Telephone Encounter (Signed)
 Jessica Erickson reports specimen contain too much "spit." Please advise.

## 2024-03-12 ENCOUNTER — Other Ambulatory Visit
Admission: RE | Admit: 2024-03-12 | Discharge: 2024-03-12 | Disposition: A | Discharge status: Home / Self Care | Source: Ambulatory Visit | Attending: Pulmonary Disease | Admitting: Pulmonary Disease

## 2024-03-12 DIAGNOSIS — R0602 Shortness of breath: Secondary | ICD-10-CM | POA: Diagnosis not present

## 2024-03-13 LAB — EXPECTORATED SPUTUM ASSESSMENT W GRAM STAIN, RFLX TO RESP C

## 2024-03-13 NOTE — Telephone Encounter (Signed)
 Lab reports they did receive new sputum specimen yesterday 03/12/2024. Lab tech reports "Sputum specimen not acceptable for testing."

## 2024-03-17 ENCOUNTER — Ambulatory Visit

## 2024-03-19 NOTE — Telephone Encounter (Signed)
 Noted. NFN.

## 2024-03-26 ENCOUNTER — Telehealth: Payer: Self-pay

## 2024-03-26 NOTE — Telephone Encounter (Signed)
 Noted.   Jacklin Mascot, MD

## 2024-03-26 NOTE — Telephone Encounter (Signed)
 E2C2 If pt's son calls back please offer pt an acute visit with Dr. Casimir Cleaver. If any questions need to be answered please transfer call to office

## 2024-03-26 NOTE — Telephone Encounter (Signed)
 Copied from CRM [id number]. Topic: General - Other >> Mar 26, 2024  1:42 PM Deaijah H wrote: Reason for CRM: Patients son Sam Creighton called in stating patient is In transition but Roise Cleaver ([facility] nurse) was advised patient haven't been seen in 3 yrs but stated it's not true & In order to be a part of a Home health w/ [facility] program she needs a PCP or to verify that she has one. Patient would like someone to reach out to Nurse Otho Blitz to get situated due to an appointment that's placed for tomorrow & so patient can be transitioned. Please call [phone] Roise Cleaver) / Patient Son would like a voicemail left (if unable to answer) once Mr. Otho Blitz has been spoken with.  Hi Margaret - patient is scheduled for a TOC with Dr. Casimir Cleaver on 07/02/2024, but she needs a sooner visit. Would you be willing to see Opal Bill as a transfer of care from Dr. Lovetta Rucks?  As of now, you do not have availability in the near future.  Would you be able to see her sooner?

## 2024-03-26 NOTE — Telephone Encounter (Signed)
 Lvm for holt walker and son to give office a call back

## 2024-03-26 NOTE — Telephone Encounter (Signed)
 Spoke with pt's son. Stated that home health jsut needed to verify pt has a pcp. Reached out to home health Roise Cleaver to confirm pt does have a pcp

## 2024-03-27 NOTE — Telephone Encounter (Signed)
 Spoke to Mr. Jessica Erickson with home health. Confirmed that pt does have a pcp and expected to be seen in July. Informed Jessica Erickson that any paperwork that needs to be signed will be signed by Dr. Casimir Erickson. Jessica Erickson verbalized understanding

## 2024-03-28 DIAGNOSIS — Z961 Presence of intraocular lens: Secondary | ICD-10-CM | POA: Diagnosis not present

## 2024-03-28 DIAGNOSIS — H01003 Unspecified blepharitis right eye, unspecified eyelid: Secondary | ICD-10-CM | POA: Diagnosis not present

## 2024-04-05 DIAGNOSIS — J449 Chronic obstructive pulmonary disease, unspecified: Secondary | ICD-10-CM | POA: Diagnosis not present

## 2024-04-14 ENCOUNTER — Other Ambulatory Visit: Payer: Self-pay

## 2024-04-14 DIAGNOSIS — E119 Type 2 diabetes mellitus without complications: Secondary | ICD-10-CM

## 2024-04-14 NOTE — Telephone Encounter (Unsigned)
 Copied from CRM [id number]. Topic: Clinical - Medication Refill >> Apr 14, 2024  1:27 PM Trula Gable C wrote: Medication: atorvastatin  (LIPITOR) 40 MG tablet  Has the patient contacted their pharmacy? Yes (Agent: If no, request that the patient contact the pharmacy for the refill. If patient does not wish to contact the pharmacy document the reason why and proceed with request.) (Agent: If yes, when and what did the pharmacy advise?)  This is the patient's preferred pharmacy:  [facility] - [place], [place] - [address] [place] [address] Phone: [phone] Fax: [phone]  Is this the correct pharmacy for this prescription? Yes If no, delete pharmacy and type the correct one.   Has the prescription been filled recently? No  Is the patient out of the medication? Yes  Has the patient been seen for an appointment in the last year OR does the patient have an upcoming appointment? Yes  Can we respond through MyChart? Yes  Agent: Please be advised that Rx refills may take up to 3 business days. We ask that you follow-up with your pharmacy.

## 2024-04-14 NOTE — Telephone Encounter (Signed)
 Last Fill: 04/18/23  Last OV: 12/13/23 Next OV: 07/02/24  Routing to provider for review/authorization.

## 2024-04-15 MED ORDER — ATORVASTATIN CALCIUM 40 MG PO TABS: 40.0000 mg | ORAL_TABLET | Freq: Every day | ORAL | 0 refills | Status: DC

## 2024-04-29 DIAGNOSIS — J479 Bronchiectasis, uncomplicated: Secondary | ICD-10-CM | POA: Diagnosis not present

## 2024-04-29 DIAGNOSIS — I272 Pulmonary hypertension, unspecified: Secondary | ICD-10-CM | POA: Diagnosis not present

## 2024-04-29 DIAGNOSIS — J849 Interstitial pulmonary disease, unspecified: Secondary | ICD-10-CM | POA: Diagnosis not present

## 2024-04-29 DIAGNOSIS — I4891 Unspecified atrial fibrillation: Secondary | ICD-10-CM | POA: Diagnosis not present

## 2024-04-29 DIAGNOSIS — I700 Atherosclerosis of aorta: Secondary | ICD-10-CM | POA: Diagnosis not present

## 2024-04-29 DIAGNOSIS — I130 Hypertensive heart and chronic kidney disease with heart failure and stage 1 through stage 4 chronic kidney disease, or unspecified chronic kidney disease: Secondary | ICD-10-CM | POA: Diagnosis not present

## 2024-04-29 DIAGNOSIS — N1832 Chronic kidney disease, stage 3b: Secondary | ICD-10-CM | POA: Diagnosis not present

## 2024-04-29 DIAGNOSIS — J9611 Chronic respiratory failure with hypoxia: Secondary | ICD-10-CM | POA: Diagnosis not present

## 2024-04-29 DIAGNOSIS — J439 Emphysema, unspecified: Secondary | ICD-10-CM | POA: Diagnosis not present

## 2024-05-12 ENCOUNTER — Ambulatory Visit: Admitting: Podiatry

## 2024-05-12 ENCOUNTER — Encounter: Payer: Self-pay | Admitting: Podiatry

## 2024-05-12 DIAGNOSIS — E1140 Type 2 diabetes mellitus with diabetic neuropathy, unspecified: Secondary | ICD-10-CM | POA: Diagnosis not present

## 2024-05-12 DIAGNOSIS — M79676 Pain in unspecified toe(s): Secondary | ICD-10-CM

## 2024-05-12 DIAGNOSIS — D689 Coagulation defect, unspecified: Secondary | ICD-10-CM | POA: Diagnosis not present

## 2024-05-12 DIAGNOSIS — B351 Tinea unguium: Secondary | ICD-10-CM | POA: Diagnosis not present

## 2024-05-14 ENCOUNTER — Encounter: Payer: Medicare PPO | Admitting: Family Medicine

## 2024-05-18 NOTE — Progress Notes (Signed)
 Subjective:  Patient ID: Jessica Erickson, female    DOB: [birth year],  MRN: [id number]  Linnette Richer presents to clinic today for at risk foot care with history of diabetic neuropathy and painful elongated mycotic toenails 1-5 bilaterally which are tender when wearing enclosed shoe gear. Pain is relieved with periodic professional debridement.  Chief Complaint  Patient presents with   Nail Problem    Thick painful toenails, 3 month follow up   New problem(s): None.   PCP is Thersia Flax, MD.  Allergies  Allergen Reactions   Penicillins Shortness Of Breath, Rash and Other (See Comments)    Has patient had a PCN reaction causing immediate rash, facial/tongue/throat swelling, SOB or lightheadedness with hypotension: Yes Has patient had a PCN reaction causing severe rash involving mucus membranes or skin necrosis: No Has patient had a PCN reaction that required hospitalization No Has patient had a PCN reaction occurring within the last 10 years: No If all of the above answers are NO, then may proceed with Cephalosporin use.   Sulfa Antibiotics Shortness Of Breath, Rash and Other (See Comments)   Amitiza [Lubiprostone]     N/v/d   Aspirin  Other (See Comments)    Reaction:  Unknown  Other reaction(s): Bleeding (intolerance) Per patient  causes nose to bleed Can take 81 mg daily without any complications Other reaction(s): bloody nose    Penicillin G Other (See Comments)    Has patient had a PCN reaction causing immediate rash, facial/tongue/throat swelling, SOB or lightheadedness with hypotension: No Has patient had a PCN reaction causing severe rash involving mucus membranes or skin necrosis: Unknown Has patient had a PCN reaction that required hospitalization: Unknown Has patient had a PCN reaction occurring within the last 10 years: Unknown If all of the above answers are NO, then may proceed with Cephalosporin use.    Review of Systems: Negative except as noted in the  HPI.  Objective: No changes noted in today's physical examination. There were no vitals filed for this visit. Jessica Erickson is a pleasant 89 y.o. female in NAD. AAO x 3.  Vascular Examination: Capillary refill time immediate b/l. Palpable pedal pulses. Pedal hair present b/l. No pain with calf compression b/l. Skin temperature gradient WNL b/l. No cyanosis or clubbing b/l. No ischemia or gangrene noted b/l. No edema noted b/l LE.  Neurological Examination: Sensation grossly intact b/l with 10 gram monofilament. Vibratory sensation intact b/l. Pt has subjective symptoms of neuropathy.  Dermatological Examination: Pedal skin with normal turgor, texture and tone b/l.  No open wounds. No interdigital macerations.   Toenails 1-5 b/l thick, discolored, elongated with subungual debris and pain on dorsal palpation.   No hyperkeratotic nor porokeratotic lesions present on today's visit.  Musculoskeletal Examination: Muscle strength 5/5 to all lower extremity muscle groups bilaterally. No pain, crepitus or joint limitation noted with ROM bilateral LE. No gross bony deformities bilaterally.  Radiographs: None   Assessment/Plan: 1. Pain due to onychomycosis of toenail   2. Blood clotting disorder (HCC)   3. Type 2 diabetes mellitus with diabetic neuropathy, without long-term current use of insulin  [facility])   Patient was evaluated and treated. All patient's and/or POA's questions/concerns addressed on today's visit. Toenails 1-5 debrided in length and girth without incident. Continue foot and shoe inspections daily. Monitor blood glucose per PCP/Endocrinologist's recommendations. Continue soft, supportive shoe gear daily. Report any pedal injuries to medical professional. Call office if there are any questions/concerns. -Patient/POA to call should there be  question/concern in the interim.   Return in about 9 weeks (around 07/14/2024).  Jessica Erickson, DPM      [facility] LOCATION: [address], [place] [address]                   Office [phone]   [facility] LOCATION: [address] [place], [place] [address] Office [phone]

## 2024-05-27 ENCOUNTER — Emergency Department
Admission: EM | Admit: 2024-05-27 | Discharge: 2024-05-27 | Disposition: A | Discharge status: Home / Self Care | Attending: Emergency Medicine | Admitting: Emergency Medicine

## 2024-05-27 ENCOUNTER — Emergency Department

## 2024-05-27 ENCOUNTER — Other Ambulatory Visit: Payer: Self-pay

## 2024-05-27 ENCOUNTER — Encounter: Payer: Self-pay | Admitting: Emergency Medicine

## 2024-05-27 DIAGNOSIS — I10 Essential (primary) hypertension: Secondary | ICD-10-CM | POA: Diagnosis not present

## 2024-05-27 DIAGNOSIS — R0789 Other chest pain: Secondary | ICD-10-CM | POA: Diagnosis not present

## 2024-05-27 DIAGNOSIS — E1140 Type 2 diabetes mellitus with diabetic neuropathy, unspecified: Secondary | ICD-10-CM | POA: Insufficient documentation

## 2024-05-27 DIAGNOSIS — M79603 Pain in arm, unspecified: Secondary | ICD-10-CM | POA: Diagnosis not present

## 2024-05-27 DIAGNOSIS — M79602 Pain in left arm: Secondary | ICD-10-CM | POA: Diagnosis not present

## 2024-05-27 DIAGNOSIS — J4489 Other specified chronic obstructive pulmonary disease: Secondary | ICD-10-CM | POA: Diagnosis not present

## 2024-05-27 DIAGNOSIS — R0902 Hypoxemia: Secondary | ICD-10-CM | POA: Diagnosis not present

## 2024-05-27 DIAGNOSIS — R079 Chest pain, unspecified: Secondary | ICD-10-CM | POA: Insufficient documentation

## 2024-05-27 DIAGNOSIS — J9811 Atelectasis: Secondary | ICD-10-CM | POA: Diagnosis not present

## 2024-05-27 DIAGNOSIS — I959 Hypotension, unspecified: Secondary | ICD-10-CM | POA: Diagnosis not present

## 2024-05-27 LAB — COMPREHENSIVE METABOLIC PANEL WITH GFR
ALT: 12 U/L (ref 0–44)
AST: 21 U/L (ref 15–41)
Albumin: 3.3 g/dL — ABNORMAL LOW (ref 3.5–5.0)
Alkaline Phosphatase: 49 U/L (ref 38–126)
Anion gap: 9 (ref 5–15)
BUN: 16 mg/dL (ref 8–23)
CO2: 35 mmol/L — ABNORMAL HIGH (ref 22–32)
Calcium: 8.8 mg/dL — ABNORMAL LOW (ref 8.9–10.3)
Chloride: 98 mmol/L (ref 98–111)
Creatinine, Ser: 1.2 mg/dL — ABNORMAL HIGH (ref 0.44–1.00)
GFR, Estimated: 41 mL/min — ABNORMAL LOW (ref >60)
Glucose, Bld: 127 mg/dL — ABNORMAL HIGH (ref 70–99)
Potassium: 3.9 mmol/L (ref 3.5–5.1)
Sodium: 142 mmol/L (ref 135–145)
Total Bilirubin: 0.8 mg/dL (ref 0.0–1.2)
Total Protein: 6.6 g/dL (ref 6.5–8.1)

## 2024-05-27 LAB — CBC
HCT: 37.3 % (ref 36.0–46.0)
Hemoglobin: 11.6 g/dL — ABNORMAL LOW (ref 12.0–15.0)
MCH: 30.1 pg (ref 26.0–34.0)
MCHC: 31.1 g/dL (ref 30.0–36.0)
MCV: 96.9 fL (ref 80.0–100.0)
Platelets: 161 10*3/uL (ref 150–400)
RBC: 3.85 MIL/uL — ABNORMAL LOW (ref 3.87–5.11)
RDW: 13.6 % (ref 11.5–15.5)
WBC: 5.1 10*3/uL (ref 4.0–10.5)
nRBC: 0 % (ref 0.0–0.2)

## 2024-05-27 LAB — TROPONIN I (HIGH SENSITIVITY): Troponin I (High Sensitivity): 11 ng/L (ref <18)

## 2024-05-27 MED ORDER — ACETAMINOPHEN 500 MG PO TABS
1000.0000 mg | ORAL_TABLET | Freq: Once | ORAL | Status: AC
  Administered 2024-05-27: 1000 mg via ORAL
  Filled 2024-05-27: qty 2

## 2024-05-27 NOTE — ED Provider Notes (Signed)
 [facility] Provider Note    Event Date/Time   First MD Initiated Contact with Patient 05/27/24 1033     (approximate)  History   Chief Complaint: Arm Pain and Flank Pain  HPI  Jessica Erickson is a 89 y.o. female with history of asthma, COPD, diabetes hypertension, hyperlipidemia, neuropathy, presents to the emergency department for left arm and chest pain.  According to report patient is coming from home with reports of intermittent pain shooting down her left chest/side as well as left arm occurring every 10 minutes or so.  Pain appears to be brief per report.  Patient is extremely hard of hearing essentially unable to communicate verbally with the patient.  Patient appears well, no distress currently lying in bed calm.  Physical Exam   Triage Vital Signs: ED Triage Vitals  Encounter Vitals Group     BP 05/27/24 1026 (!) 125/43     Girls Systolic BP Percentile --      Girls Diastolic BP Percentile --      Boys Systolic BP Percentile --      Boys Diastolic BP Percentile --      Pulse Rate 05/27/24 1026 78     Resp 05/27/24 1026 15     Temp 05/27/24 1026 98 F (36.7 C)     Temp Source 05/27/24 1026 Oral     SpO2 05/27/24 1026 100 %     Weight 05/27/24 1030 180 lb 12.4 oz (82 kg)     Height 05/27/24 1030 5' 7 (1.702 m)     Head Circumference --      Peak Flow --      Pain Score --      Pain Loc --      Pain Education --      Exclude from Growth Chart --     Most recent vital signs: Vitals:   05/27/24 1026  BP: (!) 125/43  Pulse: 78  Resp: 15  Temp: 98 F (36.7 C)  SpO2: 100%    General: Awake, no distress.  CV:  Good peripheral perfusion.  Regular rate and rhythm  Resp:  Normal effort.  Equal breath sounds bilaterally.  Abd:  No distention.  Soft, nontender.  No rebound or guarding.  Benign abdomen. Other:  Good range of motion in all extremities.  Mild lower extremity edema bilaterally.  No upper extremity edema.  Neurovascular intact  left upper extremity.   ED Results / Procedures / Treatments   EKG  EKG viewed and interpreted by myself shows sinus bradycardia 54 bpm with a narrow QRS, normal axis, normal intervals, nonspecific ST changes.  RADIOLOGY  I have reviewed and interpreted the chest x-ray images.  Low lung volume no obvious consolidation. Radiology states bibasilar atelectasis.   MEDICATIONS ORDERED IN ED: Medications  acetaminophen  (TYLENOL ) tablet 1,000 mg (has no administration in time range)     IMPRESSION / MDM / ASSESSMENT AND PLAN / ED COURSE  I reviewed the triage vital signs and the nursing notes.  Patient's presentation is most consistent with acute presentation with potential threat to life or bodily function.  Patient presents to the emergency department with complaints of intermittent sharp pain shooting down the left arm and left chest occurring every 10 minutes or so brief in duration.  Here the patient appears well, no response to chest wall palpation, benign abdomen.  Great range of motion in upper extremities.  We will check labs including a CBC chemistry and troponin.  Will obtain a chest x-ray and EKG and continue to closely monitor.  Patient's workup is reassuring with a normal CBC, reassuring chemistry, negative troponin.  Chest x-ray is clear and EKG shows no significant findings.  Given the patient's reassuring workup I believe the patient is safe for discharge home from an emergent standpoint to follow-up with her doctor for ongoing workup given her intermittent discomfort.  FINAL CLINICAL IMPRESSION(S) / ED DIAGNOSES   Chest pain   Note:  This document was prepared using Dragon voice recognition software and may include unintentional dictation errors.   Dorothyann Drivers, MD 05/27/24 1429

## 2024-05-27 NOTE — ED Triage Notes (Signed)
 Pt arrives via EMS from home with reports of left arm and side pain starting today. Hx of COPD and on 2L at home.  Pt HOH but reports the pain is intermittent and about every 10 mins. Denies SOB or any trauma.

## 2024-05-27 NOTE — ED Notes (Signed)
 Patient transported to X-ray

## 2024-05-29 ENCOUNTER — Other Ambulatory Visit: Payer: Self-pay | Admitting: Internal Medicine

## 2024-05-29 DIAGNOSIS — F32A Depression, unspecified: Secondary | ICD-10-CM

## 2024-05-29 DIAGNOSIS — G4700 Insomnia, unspecified: Secondary | ICD-10-CM

## 2024-06-02 ENCOUNTER — Telehealth: Admitting: Physician Assistant

## 2024-06-02 ENCOUNTER — Ambulatory Visit: Payer: Self-pay

## 2024-06-02 DIAGNOSIS — B029 Zoster without complications: Secondary | ICD-10-CM

## 2024-06-02 MED ORDER — VALACYCLOVIR HCL 1 G PO TABS: 1000.0000 mg | ORAL_TABLET | Freq: Three times a day (TID) | ORAL | 1 refills | Status: AC

## 2024-06-02 MED ORDER — LIDOCAINE 5 % EX OINT: 1.0000 | TOPICAL_OINTMENT | CUTANEOUS | 0 refills | Status: DC | PRN

## 2024-06-02 NOTE — Telephone Encounter (Signed)
 FYI Only or Action Required?: FYI only for provider.  Patient was last seen in primary care on 02/06/2024 by Jessica Claude, MD. Called Nurse Triage reporting Blister. Symptoms began yesterday. Interventions attempted: OTC medications: pain and Rest, hydration, or home remedies. Symptoms are: gradually worsening.  Triage Disposition: See HCP Within 4 Hours (Or PCP Triage)  Patient/caregiver understands and will follow disposition?: Yes  Copied from CRM [id number]. Topic: Clinical - Red Word Triage >> Jun 02, 2024 12:44 PM [name] wrote: Red Word that prompted transfer to Nurse Triage: [name] called and says the caregiver notice patient has extreme painful blisters on patient left side, believes could be shingles Reason for Disposition  [1] Looks infected (spreading redness, pus) AND [2] large red area (> 2 in. or 5 cm)  SEVERE pain (e.g., excruciating)  Answer Assessment - Initial Assessment Questions 1. APPEARANCE of RASH: Describe the rash.      blisters 2. LOCATION: Where is the rash located?      Along side near ribs  3. NUMBER: How many spots are there?      Several  4. SIZE: How big are the spots? (Inches, centimeters or compare to size of a coin)      8-10 inches 5. ONSET: When did the rash start?      Few days 6. ITCHING: Does the rash itch? If Yes, ask: How bad is the itch?  (Scale 0-10; or none, mild, moderate, severe)      7. PAIN: Does the rash hurt? If Yes, ask: How bad is the pain?  (Scale 0-10; or none, mild, moderate, severe)    - NONE (0): no pain    - MILD (1-3): doesn't interfere with normal activities     - MODERATE (4-7): interferes with normal activities or awakens from sleep     - SEVERE (8-10): excruciating pain, unable to do any normal activities     Moderate  8. OTHER SYMPTOMS: Do you have any other symptoms? (e.g., fever)     Denies   Additional info; pain in side last week, went to ER they didn't find any cause for pain. Now blister  rash in same area.  Answer Assessment - Initial Assessment Questions 1. APPEARANCE of RASH: Describe the rash.      Linear blistering  2. LOCATION: Where is the rash located?      flank 3. ONSET: When did the rash start?      Unsure, yesterday, pain started last week.  4. ITCHING: Does the rash itch? If Yes, ask: How bad is the itch?  (Scale 1-10; or mild, moderate, severe)     Denies 5. PAIN: Does the rash hurt? If Yes, ask: How bad is the pain?  (Scale 0-10; or none, mild, moderate, severe)    - NONE (0): no pain    - MILD (1-3): doesn't interfere with normal activities     - MODERATE (4-7): interferes with normal activities or awakens from sleep     - SEVERE (8-10): excruciating pain, unable to do any normal activities     Moderate-Severe 6. OTHER SYMPTOMS: Do you have any other symptoms? (e.g., fever)     Denies  Protocols used: Rash or Redness - Localized-A-AH, Shingles (Zoster)-A-AH

## 2024-06-02 NOTE — Telephone Encounter (Signed)
 Has a telehealth appt today at 2pm.

## 2024-06-02 NOTE — Progress Notes (Signed)
 Virtual Visit Consent   Jessica Erickson, you are scheduled for a virtual visit with a [facility] provider today. Just as with appointments in the office, your consent must be obtained to participate. Your consent will be active for this visit and any virtual visit you may have with one of our providers in the next 365 days. If you have a MyChart account, a copy of this consent can be sent to you electronically.  As this is a virtual visit, video technology does not allow for your provider to perform a traditional examination. This may limit your provider's ability to fully assess your condition. If your provider identifies any concerns that need to be evaluated in person or the need to arrange testing (such as labs, EKG, etc.), we will make arrangements to do so. Although advances in technology are sophisticated, we cannot ensure that it will always work on either your end or our end. If the connection with a video visit is poor, the visit may have to be switched to a telephone visit. With either a video or telephone visit, we are not always able to ensure that we have a secure connection.  By engaging in this virtual visit, you consent to the provision of healthcare and authorize for your insurance to be billed (if applicable) for the services provided during this visit. Depending on your insurance coverage, you may receive a charge related to this service.  I need to obtain your verbal consent now. Are you willing to proceed with your visit today? Jessica Erickson has provided verbal consent on 06/02/2024 for a virtual visit (video or telephone). Jessica CHRISTELLA Dickinson, PA-C  Date: 06/02/2024 2:14 PM   Virtual Visit via Video Note   I, Jessica Erickson, connected with  Jessica Erickson  ([id number], 11-05-1926) on 06/02/24 at  2:00 PM EDT by a video-enabled telemedicine application and verified that I am speaking with the correct person using two identifiers. Jessica Erickson, son and caregiver, assisted  completely with the visit. Patient is deaf.  Location: Patient: Virtual Visit Location Patient: Home Provider: Virtual Visit Location Provider: Home Office   I discussed the limitations of evaluation and management by telemedicine and the availability of in person appointments. The patient expressed understanding and agreed to proceed.    History of Present Illness: Jessica Erickson is a 89 y.o. who identifies as a female who was assigned female at birth, and is being seen today for rash .  HPI: Rash This is a new problem. The current episode started in the past 7 days (6 days ago pain started, seen in ER on 05/27/24 for the pain; rash developed today). The problem has been gradually worsening since onset. The affected locations include the chest. The rash is characterized by blistering, redness and pain. She was exposed to nothing.     Problems:  Patient Active Problem List   Diagnosis Date Noted   Atrophic endometrium 02/20/2024   Blood thinned due to long-term anticoagulant use 02/20/2024   Hematuria 02/11/2024   Abnormal vaginal bleeding in postmenopausal patient 02/06/2024   MRSA pneumonia (HCC) 10/07/2023   Edema 10/07/2023   Acute exacerbation of bronchiectasis (HCC) 09/26/2023   Depression, major, single episode, mild (HCC) 09/11/2023   Urination frequency 08/19/2023   SOB (shortness of breath) 08/19/2023   Vaso vagal episode 04/02/2023   Acute respiratory failure with hypoxia and hypercapnia (HCC) 11/06/2022   COPD with acute exacerbation (HCC) 11/06/2022   Chronic heart failure with preserved ejection  fraction (HFpEF) (HCC) 11/06/2022   UTI (urinary tract infection) 11/03/2022   FTT (failure to thrive) in adult 10/25/2022   Dyslipidemia 10/24/2022   Hypothyroidism 10/24/2022   GERD without esophagitis 10/24/2022   Hearing loss 09/01/2022   DNR (do not resuscitate) 08/01/2022   Abnormal gait 10/19/2021   At high risk for falls 10/14/2021   Spinal stenosis of lumbar  region 09/30/2021   Pain in both lower extremities 09/30/2021   Osteoarthritis 09/30/2021   Lumbar and sacral arthritis 09/30/2021   Pure hypercholesterolemia, unspecified 09/15/2021   Hyperlipidemia, unspecified 09/15/2021   Coronary atherosclerosis 09/15/2021   Hearing decreased, bilateral 08/22/2021   Chronic respiratory failure with hypoxia and hypercapnia (HCC) 08/14/2021   Shortness of breath 08/13/2021   CKD (chronic kidney disease), stage III (HCC) 08/13/2021   History of CVA (cerebrovascular accident) 08/13/2021   Prediabetes 06/29/2021   Pulmonary artery hypertension (HCC) 05/03/2021   Grade I diastolic dysfunction 08/16/2020   Obesity (BMI 30-39.9) 08/16/2020   Chronic kidney disease, stage 3b (HCC) 04/06/2020   Thrombocytopenia (HCC) 04/06/2020   Seborrheic keratoses 04/06/2020   Hip pain 09/30/2019   Arthritis 09/30/2019   Fall 03/11/2019   Epistaxis 12/11/2018   History of DVT (deep vein thrombosis) 11/12/2018   Bilateral lower extremity edema 06/04/2018   Hearing loss of both ears 06/04/2018   Actinic keratosis 06/04/2018   Bronchiectasis (HCC) 06/04/2018   Depression 05/17/2018   Dehydration 05/15/2018   Hypertension 05/03/2018   Lymphedema 05/02/2018   Iron deficiency anemia 05/02/2018   Presence of IVC filter 05/02/2018   Anemia 04/11/2018   Fatigue 04/11/2018   Moderate protein-calorie malnutrition (HCC) 03/22/2018   Orthostasis 03/05/2018   Dizziness 03/04/2018   COPD (chronic obstructive pulmonary disease) (HCC) 12/13/2017   Anxiety and depression 12/13/2017   Trigeminal neuralgia 12/13/2017   GERD (gastroesophageal reflux disease) 12/13/2017   Constipation 12/13/2017   Insomnia 12/13/2017   Low back pain 10/08/2015   Collapsed vertebra, not elsewhere classified, thoracic region, initial encounter for fracture (HCC) 09/20/2015   Basal cell carcinoma of scalp 04/06/2014   Fothergill's neuralgia 08/05/2013   Bladder infection, chronic 02/11/2013    Female genuine stress incontinence 02/11/2013   Incomplete bladder emptying 02/11/2013   Intrinsic sphincter deficiency 02/11/2013   Mixed incontinence 02/11/2013   Excessive urination at night 02/11/2013   Bladder retention 02/11/2013   FOM (frequency of micturition) 02/11/2013   Basal cell carcinoma of face 09/13/2011    Allergies:  Allergies  Allergen Reactions   Penicillins Shortness Of Breath, Rash and Other (See Comments)    Has patient had a PCN reaction causing immediate rash, facial/tongue/throat swelling, SOB or lightheadedness with hypotension: Yes Has patient had a PCN reaction causing severe rash involving mucus membranes or skin necrosis: No Has patient had a PCN reaction that required hospitalization No Has patient had a PCN reaction occurring within the last 10 years: No If all of the above answers are NO, then may proceed with Cephalosporin use.   Sulfa Antibiotics Shortness Of Breath, Rash and Other (See Comments)   Amitiza [Lubiprostone]     N/v/d   Aspirin  Other (See Comments)    Reaction:  Unknown  Other reaction(s): Bleeding (intolerance) Per patient  causes nose to bleed Can take 81 mg daily without any complications Other reaction(s): bloody nose    Penicillin G Other (See Comments)    Has patient had a PCN reaction causing immediate rash, facial/tongue/throat swelling, SOB or lightheadedness with hypotension: No Has patient had a PCN  reaction causing severe rash involving mucus membranes or skin necrosis: Unknown Has patient had a PCN reaction that required hospitalization: Unknown Has patient had a PCN reaction occurring within the last 10 years: Unknown If all of the above answers are NO, then may proceed with Cephalosporin use.   Medications:  Current Outpatient Medications:    lidocaine  (XYLOCAINE ) 5 % ointment, Apply 1 Application topically as needed., Disp: 35.44 g, Rfl: 0   valACYclovir (VALTREX) 1000 MG tablet, Take 1 tablet (1,000 mg  total) by mouth 3 (three) times daily for 7 days., Disp: 21 tablet, Rfl: 1   ACCU-CHEK GUIDE TEST test strip, USE TO TEST BLOOD SUGAR TWICE DAILY, Disp: 200 each, Rfl: 1   Accu-Chek Softclix Lancets lancets, Use as instructed, Disp: 100 each, Rfl: 12   acetaminophen  (TYLENOL ) 325 MG tablet, Take 650 mg by mouth 2 (two) times daily as needed for mild pain (pain score 1-3)., Disp: , Rfl:    arformoterol  (BROVANA ) 15 MCG/2ML NEBU, Take 2 mLs (15 mcg total) by nebulization 2 (two) times daily., Disp: 120 mL, Rfl: 6   atorvastatin  (LIPITOR) 40 MG tablet, Take 1 tablet (40 mg total) by mouth daily., Disp: 90 tablet, Rfl: 0   bisacodyl  (DULCOLAX) 5 MG EC tablet, Take 5 mg by mouth daily as needed for moderate constipation., Disp: , Rfl:    Blood Glucose Monitoring Suppl (ACCU-CHEK AVIVA PLUS) w/Device KIT, Use as directed, Disp: 1 kit, Rfl: 0   budesonide  (PULMICORT ) 0.25 MG/2ML nebulizer solution, Take 2 mLs (0.25 mg total) by nebulization 2 (two) times daily., Disp: 120 mL, Rfl: 5   calcium -vitamin D  (OSCAL WITH D) 500-5 MG-MCG tablet, Take 1 tablet by mouth 2 (two) times daily. Unable to find former dose 600ca/400 vitamin D  inform patient dose change O[place], Disp: 180 tablet, Rfl: 3   cefdinir  (OMNICEF ) 300 MG capsule, Take 1 capsule (300 mg total) by mouth 2 (two) times daily., Disp: 14 capsule, Rfl: 0   cetirizine  (ZYRTEC ) 10 MG tablet, TAKE 1 TABLET BY MOUTH DAILY., Disp: 90 tablet, Rfl: 3   docusate sodium  (COLACE) 100 MG capsule, Take 100 mg by mouth 2 (two) times daily., Disp: , Rfl:    ELIQUIS  2.5 MG TABS tablet, TAKE 1 TABLET BY MOUTH 2 TIMES DAILY., Disp: 180 tablet, Rfl: 3   feeding supplement, GLUCERNA SHAKE, (GLUCERNA SHAKE) LIQD, Take 237 mLs by mouth 2 (two) times daily between meals., Disp: , Rfl: 0   furosemide  (LASIX ) 40 MG tablet, TAKE ONE TABLET (40MG ) DAILY. MAY TAKE ASECOND DOSE OF 40MG  IF NEEDED AT LUNCH FOR 5 DAYS, Disp: 120 tablet, Rfl: 3   gabapentin  (NEURONTIN ) 100 MG capsule,  TAKE 2 CAPSULES BY MOUTH 3 TIMES DAILY., Disp: 504 capsule, Rfl: 3   guaiFENesin  (MUCINEX ) 600 MG 12 hr tablet, Take 2 tablets (1,200 mg total) by mouth 2 (two) times daily as needed for cough or to loosen phlegm. (Patient taking differently: Take 600 mg by mouth 2 (two) times daily as needed for cough or to loosen phlegm.), Disp: , Rfl:    ipratropium-albuterol  (DUONEB) 0.5-2.5 (3) MG/3ML SOLN, TAKE 3 ML BY NEBULIZATION EVERY 4 HOURS AS NEEDED, Disp: 360 mL, Rfl: 11   levothyroxine  (SYNTHROID ) 150 MCG tablet, TAKE ONE TABLET BY MOUTH EVERY DAY BEFORE BREAKFAST, Disp: 90 tablet, Rfl: 1   Lidocaine -Menthol (ICY HOT MAX LIDOCAINE ) 4-1 % CREA, Apply 1 application. topically 3 (three) times daily with meals as needed. Dispense cream or spray pt preference, Disp: 120 g, Rfl: 11  Melatonin 10 MG TABS, Take 10 mg by mouth at bedtime., Disp: 90 tablet, Rfl: 3   mirtazapine  (REMERON ) 15 MG tablet, TAKE 1 TABLET BY MOUTH AT BEDTIME, Disp: 30 tablet, Rfl: 0   Multiple Vitamin (MULTIVITAMIN WITH MINERALS) TABS tablet, Take 1 tablet by mouth daily. , Disp: , Rfl:    OXYGEN , Inhale 2 L/min into the lungs continuous., Disp: , Rfl:    pantoprazole  (PROTONIX ) 40 MG tablet, TAKE ONE TABLET BY MOUTH DAILY 30 MINUTES BEFORE LUNCH OR DINNER, Disp: 90 tablet, Rfl: 3   polyethylene glycol powder (GLYCOLAX /MIRALAX ) 17 GM/SCOOP powder, Take 17 g by mouth daily., Disp: 255 g, Rfl: 11   polyvinyl alcohol  (LIQUIFILM TEARS) 1.4 % ophthalmic solution, Place 1 drop into both eyes at bedtime., Disp: 15 mL, Rfl: 0   revefenacin  (YUPELRI ) 175 MCG/3ML nebulizer solution, Take 3 mLs (175 mcg total) by nebulization daily., Disp: 90 mL, Rfl: 6   TRELEGY ELLIPTA  100-62.5-25 MCG/ACT AEPB, Inhale 1 puff into the lungs daily., Disp: , Rfl:   Observations/Objective: Patient is well-developed, well-nourished in no acute distress.  Resting comfortably at home.  Head is normocephalic, atraumatic.  No labored breathing.  Speech is clear and  coherent with logical content.  Patient is alert and oriented at baseline.  Large band of erythema with vesicular and macular lesions wrapping around left abdomen, left flank and onto left back  Assessment and Plan: 1. Herpes zoster without complication (Primary) - valACYclovir (VALTREX) 1000 MG tablet; Take 1 tablet (1,000 mg total) by mouth 3 (three) times daily for 7 days.  Dispense: 21 tablet; Refill: 1 - lidocaine  (XYLOCAINE ) 5 % ointment; Apply 1 Application topically as needed.  Dispense: 35.44 g; Refill: 0  - Valtrex for shingles as above - Patient already on Gabapentin  - Topical lidocaine  to numb pain - Monitor for secondary skin infection - Discussed isolation precautions and to avoid immunocompromised, pregnant women, or children under 1 yr old - Keep skin clean and dry - Seek further evaluation if worsening or fails to improve  Follow Up Instructions: I discussed the assessment and treatment plan with the patient. The patient was provided an opportunity to ask questions and all were answered. The patient agreed with the plan and demonstrated an understanding of the instructions.  A copy of instructions were sent to the patient via MyChart unless otherwise noted below.    The patient was advised to call back or seek an in-person evaluation if the symptoms worsen or if the condition fails to improve as anticipated.    Jessica CHRISTELLA Dickinson, PA-C

## 2024-06-02 NOTE — Patient Instructions (Signed)
 Jessica Erickson, thank you for joining [name], PA-C for today's virtual visit.  While this provider is not your primary care provider (PCP), if your PCP is located in our provider database this encounter information will be shared with them immediately following your visit.   A [facility] MyChart account gives you access to today's visit and all your visits, tests, and labs performed at [facility]  click here if you don't have a  [facility] MyChart account or go to [email or web address]  Consent: (Patient) Jessica Erickson provided verbal consent for this virtual visit at the beginning of the encounter.  Current Medications:  Current Outpatient Medications:    lidocaine  (XYLOCAINE ) 5 % ointment, Apply 1 Application topically as needed., Disp: 35.44 g, Rfl: 0   valACYclovir (VALTREX) 1000 MG tablet, Take 1 tablet (1,000 mg total) by mouth 3 (three) times daily for 7 days., Disp: 21 tablet, Rfl: 1   ACCU-CHEK GUIDE TEST test strip, USE TO TEST BLOOD SUGAR TWICE DAILY, Disp: 200 each, Rfl: 1   Accu-Chek Softclix Lancets lancets, Use as instructed, Disp: 100 each, Rfl: 12   acetaminophen  (TYLENOL ) 325 MG tablet, Take 650 mg by mouth 2 (two) times daily as needed for mild pain (pain score 1-3)., Disp: , Rfl:    arformoterol  (BROVANA ) 15 MCG/2ML NEBU, Take 2 mLs (15 mcg total) by nebulization 2 (two) times daily., Disp: 120 mL, Rfl: 6   atorvastatin  (LIPITOR) 40 MG tablet, Take 1 tablet (40 mg total) by mouth daily., Disp: 90 tablet, Rfl: 0   bisacodyl  (DULCOLAX) 5 MG EC tablet, Take 5 mg by mouth daily as needed for moderate constipation., Disp: , Rfl:    Blood Glucose Monitoring Suppl (ACCU-CHEK AVIVA PLUS) w/Device KIT, Use as directed, Disp: 1 kit, Rfl: 0   budesonide  (PULMICORT ) 0.25 MG/2ML nebulizer solution, Take 2 mLs (0.25 mg total) by nebulization 2 (two) times daily., Disp: 120 mL, Rfl: 5   calcium -vitamin D  (OSCAL WITH D) 500-5 MG-MCG tablet, Take 1 tablet by  mouth 2 (two) times daily. Unable to find former dose 600ca/400 vitamin D  inform patient dose change [place], Disp: 180 tablet, Rfl: 3   cefdinir  (OMNICEF ) 300 MG capsule, Take 1 capsule (300 mg total) by mouth 2 (two) times daily., Disp: 14 capsule, Rfl: 0   cetirizine  (ZYRTEC ) 10 MG tablet, TAKE 1 TABLET BY MOUTH DAILY., Disp: 90 tablet, Rfl: 3   docusate sodium  (COLACE) 100 MG capsule, Take 100 mg by mouth 2 (two) times daily., Disp: , Rfl:    ELIQUIS  2.5 MG TABS tablet, TAKE 1 TABLET BY MOUTH 2 TIMES DAILY., Disp: 180 tablet, Rfl: 3   feeding supplement, GLUCERNA SHAKE, (GLUCERNA SHAKE) LIQD, Take 237 mLs by mouth 2 (two) times daily between meals., Disp: , Rfl: 0   furosemide  (LASIX ) 40 MG tablet, TAKE ONE TABLET (40MG ) DAILY. MAY TAKE ASECOND DOSE OF 40MG  IF NEEDED AT LUNCH FOR 5 DAYS, Disp: 120 tablet, Rfl: 3   gabapentin  (NEURONTIN ) 100 MG capsule, TAKE 2 CAPSULES BY MOUTH 3 TIMES DAILY., Disp: 504 capsule, Rfl: 3   guaiFENesin  (MUCINEX ) 600 MG 12 hr tablet, Take 2 tablets (1,200 mg total) by mouth 2 (two) times daily as needed for cough or to loosen phlegm. (Patient taking differently: Take 600 mg by mouth 2 (two) times daily as needed for cough or to loosen phlegm.), Disp: , Rfl:    ipratropium-albuterol  (DUONEB) 0.5-2.5 (3) MG/3ML SOLN, TAKE 3 ML BY NEBULIZATION EVERY 4 HOURS AS NEEDED, Disp: 360  mL, Rfl: 11   levothyroxine  (SYNTHROID ) 150 MCG tablet, TAKE ONE TABLET BY MOUTH EVERY DAY BEFORE BREAKFAST, Disp: 90 tablet, Rfl: 1   Lidocaine -Menthol (ICY HOT MAX LIDOCAINE ) 4-1 % CREA, Apply 1 application. topically 3 (three) times daily with meals as needed. Dispense cream or spray pt preference, Disp: 120 g, Rfl: 11   Melatonin 10 MG TABS, Take 10 mg by mouth at bedtime., Disp: 90 tablet, Rfl: 3   mirtazapine  (REMERON ) 15 MG tablet, TAKE 1 TABLET BY MOUTH AT BEDTIME, Disp: 30 tablet, Rfl: 0   Multiple Vitamin (MULTIVITAMIN WITH MINERALS) TABS tablet, Take 1 tablet by mouth daily. , Disp: , Rfl:     OXYGEN , Inhale 2 L/min into the lungs continuous., Disp: , Rfl:    pantoprazole  (PROTONIX ) 40 MG tablet, TAKE ONE TABLET BY MOUTH DAILY 30 MINUTES BEFORE LUNCH OR DINNER, Disp: 90 tablet, Rfl: 3   polyethylene glycol powder (GLYCOLAX /MIRALAX ) 17 GM/SCOOP powder, Take 17 g by mouth daily., Disp: 255 g, Rfl: 11   polyvinyl alcohol  (LIQUIFILM TEARS) 1.4 % ophthalmic solution, Place 1 drop into both eyes at bedtime., Disp: 15 mL, Rfl: 0   revefenacin  (YUPELRI ) 175 MCG/3ML nebulizer solution, Take 3 mLs (175 mcg total) by nebulization daily., Disp: 90 mL, Rfl: 6   TRELEGY ELLIPTA  100-62.5-25 MCG/ACT AEPB, Inhale 1 puff into the lungs daily., Disp: , Rfl:    Medications ordered in this encounter:  Meds ordered this encounter  Medications   valACYclovir (VALTREX) 1000 MG tablet    Sig: Take 1 tablet (1,000 mg total) by mouth 3 (three) times daily for 7 days.    Dispense:  21 tablet    Refill:  1    Supervising Provider:   LAMPTEY, PHILIP O [[id number]]   lidocaine  (XYLOCAINE ) 5 % ointment    Sig: Apply 1 Application topically as needed.    Dispense:  35.44 g    Refill:  0    Supervising Provider:   BLAISE ALEENE KIDD [[id number]]     *If you need refills on other medications prior to your next appointment, please contact your pharmacy*  Follow-Up: Call back or seek an in-person evaluation if the symptoms worsen or if the condition fails to improve as anticipated.  [facility] [phone]  Other Instructions Shingles  Shingles, or herpes zoster, is an infection. It gives you a skin rash and blisters. These infected areas may hurt a lot. Shingles only happens if: You've had chickenpox. You've been given a shot called a vaccine to protect you from getting chickenpox. Shingles is rare in this case. What are the causes? Shingles is caused by a germ called the varicella-zoster virus. This is the same germ that causes chickenpox. After you're exposed to the germ, it stays in your  body but is dormant. This means it isn't active. Shingles happens if the germ becomes active again. This can happen years after you're first exposed to the germ. What increases the risk? You may be more likely to get shingles if: You're older than 88 years of age. You're under a lot of stress. You have a weak immune system. The immune system is your body's defense system. It may be weak if: You have human immunodeficiency virus (HIV). You have acquired immunodeficiency syndrome (AIDS). You have cancer. You take medicines that weaken your immune system. These include organ transplant medicines. What are the signs or symptoms? The first symptoms of shingles may be itching, tingling, or pain. Your skin may feel like  it's burning. A few days or weeks later, you'll get a rash. Here's what you can expect: The rash is likely to be on one side of your body. The rash may be shaped like a belt or a band. Over time, it will turn into blisters filled with fluid. The blisters will break open and change into scabs. The scabs will dry up in about 2-3 weeks. You may also have: A fever. Chills. A headache. Nausea. How is this diagnosed? Shingles is diagnosed with a skin exam. A sample called a culture may be taken from one of your blisters and sent to a lab. This will show if you have shingles. How is this treated? The rash may last for several weeks. There's no cure for shingles, but your health care provider may give you medicines. These medicines may: Help with pain. Help with itching. Help with irritation and swelling. Help you get better sooner. Help to prevent long-term problems. If the rash is on your face, you may need to see an eye doctor or an ear, nose, and throat (ENT) doctor. Follow these instructions at home: Medicines Take your medicines only as told by your provider. Put an anti-itch cream or numbing cream on the rash or blisters as told by your provider. Relieving itching and  discomfort  To help with itching: Put cold, wet cloths called cold compresses on the rash or blisters. Take a cool bath. Try adding baking soda or dry oatmeal to the water. Do not bathe in hot water. Use calamine lotion on the rash or blisters. You can get this type of lotion at the store. Blister and rash care Keep your rash covered with a loose bandage. Wear loose clothes that don't rub on your rash. Take care of your rash as told by your provider. Make sure you: Wash your hands with soap and water for at least 20 seconds before and after you change your bandage. If you can't use soap and water, use hand sanitizer. Keep your rash and blisters clean by washing them with mild soap and cool water. Change your bandage. Check your rash every day for signs of infection. Check for: More redness, swelling, or pain. Fluid or blood. Warmth. Pus or a bad smell. Do not scratch your rash. Do not pick at your blisters. To help you not scratch: Keep your fingernails clean and cut short. Try to wear gloves or mittens when you sleep. General instructions Rest. Wash your hands often with soap and water for at least 20 seconds. If you can't use soap and water, use hand sanitizer. Washing your hands lowers your chance of getting a skin infection. Your infection can cause chickenpox in others. If you have blisters that aren't scabs yet, stay away from: Babies. Pregnant people. Children who have eczema. Older people who have organ transplants. People who have a long-term, or chronic, illness. Anyone who hasn't had chickenpox before. Anyone who hasn't gotten the chickenpox vaccine. How is this prevented? Vaccines are the best way to prevent you from getting chickenpox or shingles. Talk with your provider about getting these shots. Where to find more information Centers for Disease Control and Prevention (CDC): TonerPromos.no Contact a health care provider if: Your pain doesn't get better with  medicine. Your pain doesn't get better after the rash heals. You have any signs of infection around the rash. Your rash or blisters get worse. You have a fever or chills. Get help right away if: The rash is on your face or nose.  You have pain in your face or by your eye. You lose feeling on one side of your face. You have trouble seeing. You have ear pain or ringing in your ear. This information is not intended to replace advice given to you by your health care provider. Make sure you discuss any questions you have with your health care provider. Document Revised: 08/23/2023 Document Reviewed: 01/05/2023 Elsevier Patient Education  2024 Elsevier Inc.   If you have been instructed to have an in-person evaluation today at a local Urgent Care facility, please use the link below. It will take you to a list of all of our available [facility], including address, phone number and hours of operation. Please do not delay care.  [facility]  If you or a family member do not have a primary care provider, use the link below to schedule a visit and establish care. When you choose a [facility] primary care physician or advanced practice provider, you gain a long-term partner in health. Find a Primary Care Provider  Learn more about [facility]'s in-office and virtual care options: [facility] - Get Care Now

## 2024-06-04 ENCOUNTER — Telehealth: Payer: Self-pay

## 2024-06-04 NOTE — Telephone Encounter (Signed)
 Copied from CRM [id number]. Topic: Clinical - Medication Question >> Jun 04, 2024 12:18 PM Whitney O wrote: Reason for RMF:ejupzwu son mr norleen is calling to  speak to someone needing clartification  theres been some changes with patient inhalers  and i just need some clarification about the frequency and about the 3 nebulizers he has put her on . and she lives with me and I didn't  get to take her to the appointment so just needing to speak with someone for clarification on her medication [phone] Mr norleen and he is on the [facility] form

## 2024-06-04 NOTE — Telephone Encounter (Signed)
 Reviewed office visit note from 03/05/2024. Advised that patient needs to continue Brovana  BID, Yupelri  daily, Duonebs as needed. Mr. [name] wants to know if Mrs. Privitera can use Brovana  and Yupelri  at the same time. Please advise.

## 2024-06-10 ENCOUNTER — Other Ambulatory Visit: Payer: Self-pay | Admitting: Internal Medicine

## 2024-06-10 DIAGNOSIS — E039 Hypothyroidism, unspecified: Secondary | ICD-10-CM

## 2024-06-11 ENCOUNTER — Ambulatory Visit: Admitting: Pulmonary Disease

## 2024-06-11 ENCOUNTER — Encounter: Payer: Self-pay | Admitting: Pulmonary Disease

## 2024-06-11 VITALS — BP 130/70 | HR 60 | Temp 97.8°F | Ht 67.0 in | Wt 177.8 lb

## 2024-06-11 DIAGNOSIS — Z87891 Personal history of nicotine dependence: Secondary | ICD-10-CM | POA: Diagnosis not present

## 2024-06-11 DIAGNOSIS — J479 Bronchiectasis, uncomplicated: Secondary | ICD-10-CM | POA: Diagnosis not present

## 2024-06-11 DIAGNOSIS — J449 Chronic obstructive pulmonary disease, unspecified: Secondary | ICD-10-CM

## 2024-06-11 DIAGNOSIS — J439 Emphysema, unspecified: Secondary | ICD-10-CM

## 2024-06-11 MED ORDER — YUPELRI 175 MCG/3ML IN SOLN: 175.0000 ug | Freq: Every day | RESPIRATORY_TRACT | 0 refills | Status: DC

## 2024-06-11 MED ORDER — YUPELRI 175 MCG/3ML IN SOLN: 175.0000 ug | Freq: Every day | RESPIRATORY_TRACT | 3 refills | Status: AC

## 2024-06-11 MED ORDER — BUDESONIDE 0.25 MG/2ML IN SUSP: 0.2500 mg | Freq: Two times a day (BID) | RESPIRATORY_TRACT | 3 refills | Status: AC

## 2024-06-11 MED ORDER — ARFORMOTEROL TARTRATE 15 MCG/2ML IN NEBU: 15.0000 ug | INHALATION_SOLUTION | Freq: Two times a day (BID) | RESPIRATORY_TRACT | 6 refills | Status: AC

## 2024-06-11 NOTE — Progress Notes (Unsigned)
 Synopsis: Referred in by Onesimo Claude, MD   Subjective:   PATIENT ID: Jessica Erickson GENDER: female DOB: Aug 26, 1926, MRN: [id number]  Chief Complaint  Patient presents with  . Follow-up    SOB, COPD with Chronic bronchitis w/ emphysema, x-ray: 05/27/24, CT: 02/22/24  Breathing has been stable since last visit, and is still using her nebulizer and oxygen  as prescribed. Sputum production has been stable as well. Recently got over a shingles flare up as well.    HPI Jessica Erickson is a 89 year old female patient with a past medical history of COPD, Bronchiectasis, CAD, CVA type II DM presenting today to the pulmonary clinic for ongoing cough and sputum production.   History taken mainly from her son who was with her today. She is deaf and only communicates via writing. She walks independently with a walker.   She was hospitalized in October for MRSA pneumonia and was treated with Vancomycin  and discharged home. She receives in house care.   She did well however still with cough and sputum production. She carries the diagnosis of COPD and has been on Duonebs.   CT chest 09/26/2023 Filling of the right bronchus intermedius with RML and RLL consolidations. Similar opacity seen on the left lower lobe.   CT chest 02/12/2024 - Improved consolidative opacities, Bronchiectasis persistent specifically at the base. Enlarged subcarinal node.  Feeling ok today, has no issues with breathing. Still with persistent increase in secretions.    ROS All systems were reviewed and are negative except for the above.  Objective:   Vitals:   06/11/24 1103  BP: 130/70  Pulse: 60  Temp: 97.8 F (36.6 C)  TempSrc: Oral  SpO2: 93%  Weight: 177 lb 12.8 oz (80.6 kg)  Height: 5' 7 (1.702 m)    93% on RA BMI Readings from Last 3 Encounters:  06/11/24 27.85 kg/m  05/27/24 28.31 kg/m  03/05/24 28.51 kg/m   Wt Readings from Last 3 Encounters:  06/11/24 177 lb 12.8 oz (80.6 kg)  05/27/24 180 lb  12.4 oz (82 kg)  03/05/24 182 lb (82.6 kg)    Physical Exam GEN: NAD, Healthy Appearing HEENT: Supple Neck, Reactive Pupils, EOMI  CVS: Normal S1, Normal S2, RRR, No murmurs or ES appreciated  Lungs: Ronchi heard bilaterally with faint crackles at the bases.  Abdomen: Soft, non tender, non distended, + BS  Extremities: Warm and well perfused, No edema  Skin: No suspicious lesions appreciated  Psych: Normal Affect  Ancillary Information   CBC    Component Value Date/Time   WBC 5.1 05/27/2024 1046   RBC 3.85 (L) 05/27/2024 1046   HGB 11.6 (L) 05/27/2024 1046   HGB 13.8 06/09/2021 0907   HCT 37.3 05/27/2024 1046   HCT 41.3 06/09/2021 0907   PLT 161 05/27/2024 1046   PLT 185 06/09/2021 0907   MCV 96.9 05/27/2024 1046   MCV 90 06/09/2021 0907   MCH 30.1 05/27/2024 1046   MCHC 31.1 05/27/2024 1046   RDW 13.6 05/27/2024 1046   RDW 12.9 06/09/2021 0907   LYMPHSABS 1.7 02/06/2024 1512   LYMPHSABS 1.8 06/09/2021 0907   MONOABS 0.6 02/06/2024 1512   EOSABS 0.2 02/06/2024 1512   EOSABS 0.3 06/09/2021 0907   BASOSABS 0.0 02/06/2024 1512   BASOSABS 0.0 06/09/2021 0907  Labs and imaging were reviewed.       No data to display           Assessment & Plan:  Jessica Erickson is a  89 year old female patient with a past medical history of COPD, Bronchiectasis, CAD, CVA type II DM presenting today to the pulmonary clinic for ongoing cough and sputum production.   #hx of MRSA Pneumonia  Will repeat CT chest in 4 weeks to evaluate for improvement in opacities. Will evaluate for aspiration on next visit.   #Clinical COPD  []  Duonebs as needed.  []  c/w Brovana  BID. C/w Yupelri  daily.  []  Discontinue Pulmicort .   #Bronchiectasis likely from prior infections.  Has not been using hypertonic saline. I am ok with discontinuing that. Advised on flutter valve use bid.    No follow-ups on file.  I spent 20 minutes caring for this patient today, including preparing to see the patient,  obtaining a medical history , reviewing a separately obtained history, performing a medically appropriate examination and/or evaluation, counseling and educating the patient/family/caregiver, ordering medications, tests, or procedures, documenting clinical information in the electronic health record, and independently interpreting results (not separately reported/billed) and communicating results to the patient/family/caregiver  Darrin Barn, MD [facility] 06/11/2024 11:07 AM

## 2024-06-11 NOTE — Telephone Encounter (Signed)
 The patient was seen in the office today and her medications were reviewed.  Nothing further needed.

## 2024-06-24 ENCOUNTER — Other Ambulatory Visit: Payer: Self-pay | Admitting: Internal Medicine

## 2024-06-24 ENCOUNTER — Other Ambulatory Visit: Payer: Self-pay

## 2024-06-24 DIAGNOSIS — B029 Zoster without complications: Secondary | ICD-10-CM

## 2024-06-24 DIAGNOSIS — R600 Localized edema: Secondary | ICD-10-CM

## 2024-06-24 MED ORDER — LIDOCAINE 5 % EX OINT
1.0000 | TOPICAL_OINTMENT | Freq: Two times a day (BID) | CUTANEOUS | 0 refills | Status: AC | PRN
Start: 2024-06-24 — End: ?

## 2024-06-24 NOTE — Telephone Encounter (Signed)
 Rx ok'd for lidocaine  ointment.

## 2024-06-24 NOTE — Telephone Encounter (Signed)
 Please confirm how she is doing. Still with increased pain? Any open lesions?

## 2024-06-24 NOTE — Telephone Encounter (Signed)
 Noted

## 2024-06-24 NOTE — Telephone Encounter (Signed)
 Jessica Erickson is aware that the ointment has been called in.

## 2024-06-24 NOTE — Telephone Encounter (Signed)
 [name] said his Mother does not have any open lesions and did not have many open lesions when she had the shingles. [name] stated Mother is 67 and she said she hurts so I do what I can to help her be comfortable and have a good life. [name] also states the tube of ointment is very small and they are using the ointment 2 x a day once in the AM then in the PM.

## 2024-07-02 ENCOUNTER — Other Ambulatory Visit: Payer: Self-pay | Admitting: Internal Medicine

## 2024-07-02 ENCOUNTER — Ambulatory Visit

## 2024-07-02 VITALS — BP 132/70 | HR 62 | Temp 98.8°F | Wt 179.8 lb

## 2024-07-02 DIAGNOSIS — E039 Hypothyroidism, unspecified: Secondary | ICD-10-CM

## 2024-07-02 DIAGNOSIS — E7800 Pure hypercholesterolemia, unspecified: Secondary | ICD-10-CM | POA: Diagnosis not present

## 2024-07-02 DIAGNOSIS — K5900 Constipation, unspecified: Secondary | ICD-10-CM

## 2024-07-02 DIAGNOSIS — R630 Anorexia: Secondary | ICD-10-CM | POA: Diagnosis not present

## 2024-07-02 DIAGNOSIS — J432 Centrilobular emphysema: Secondary | ICD-10-CM

## 2024-07-02 DIAGNOSIS — G500 Trigeminal neuralgia: Secondary | ICD-10-CM | POA: Diagnosis not present

## 2024-07-02 DIAGNOSIS — K219 Gastro-esophageal reflux disease without esophagitis: Secondary | ICD-10-CM

## 2024-07-02 DIAGNOSIS — F3342 Major depressive disorder, recurrent, in full remission: Secondary | ICD-10-CM

## 2024-07-02 DIAGNOSIS — G4700 Insomnia, unspecified: Secondary | ICD-10-CM

## 2024-07-02 DIAGNOSIS — R7303 Prediabetes: Secondary | ICD-10-CM

## 2024-07-02 DIAGNOSIS — N1831 Chronic kidney disease, stage 3a: Secondary | ICD-10-CM

## 2024-07-02 DIAGNOSIS — R600 Localized edema: Secondary | ICD-10-CM

## 2024-07-02 DIAGNOSIS — Z86718 Personal history of other venous thrombosis and embolism: Secondary | ICD-10-CM | POA: Diagnosis not present

## 2024-07-02 DIAGNOSIS — F32A Depression, unspecified: Secondary | ICD-10-CM

## 2024-07-02 DIAGNOSIS — E1122 Type 2 diabetes mellitus with diabetic chronic kidney disease: Secondary | ICD-10-CM

## 2024-07-02 DIAGNOSIS — E119 Type 2 diabetes mellitus without complications: Secondary | ICD-10-CM

## 2024-07-02 MED ORDER — POLYETHYLENE GLYCOL 3350 17 GM/SCOOP PO POWD
17.0000 g | Freq: Every day | ORAL | 1 refills | Status: AC | PRN
Start: 2024-07-02 — End: ?

## 2024-07-02 MED ORDER — PANTOPRAZOLE SODIUM 40 MG PO TBEC: 40.0000 mg | DELAYED_RELEASE_TABLET | Freq: Every day | ORAL | 3 refills | Status: AC

## 2024-07-02 MED ORDER — GABAPENTIN 100 MG PO CAPS: ORAL_CAPSULE | ORAL | 3 refills | Status: AC

## 2024-07-02 MED ORDER — MIRTAZAPINE 30 MG PO TBDP: 30.0000 mg | ORAL_TABLET | Freq: Every day | ORAL | 3 refills | Status: DC

## 2024-07-02 MED ORDER — FUROSEMIDE 40 MG PO TABS: 40.0000 mg | ORAL_TABLET | Freq: Every day | ORAL | 3 refills | Status: DC

## 2024-07-02 MED ORDER — APIXABAN 2.5 MG PO TABS: 2.5000 mg | ORAL_TABLET | Freq: Two times a day (BID) | ORAL | 3 refills | Status: AC

## 2024-07-02 MED ORDER — LEVOTHYROXINE SODIUM 150 MCG PO TABS: 150.0000 ug | ORAL_TABLET | Freq: Every day | ORAL | 1 refills | Status: AC

## 2024-07-02 MED ORDER — ATORVASTATIN CALCIUM 40 MG PO TABS: 40.0000 mg | ORAL_TABLET | Freq: Every day | ORAL | 3 refills | Status: AC

## 2024-07-02 NOTE — Assessment & Plan Note (Signed)
 Increase water intake daily to help with constipation as well. Check CMP today. Avoid NSAIDs

## 2024-07-02 NOTE — Assessment & Plan Note (Signed)
Continue f/u with vascular surgery.   

## 2024-07-02 NOTE — Telephone Encounter (Signed)
 Will discuss medication refill after her appointment with me later this afternoon.   Luke Shade, MD

## 2024-07-02 NOTE — Assessment & Plan Note (Addendum)
 PT therapy referral for lymphedema therapy done today after discussing this with patient and her son.  Continue Lasix  40 mg once a day.

## 2024-07-02 NOTE — Assessment & Plan Note (Signed)
 Continue Apixaban  2.5 mg BID for recurrent DVT

## 2024-07-02 NOTE — Assessment & Plan Note (Signed)
 On Lasix  40 mg, continue. Patient euvolemic today.

## 2024-07-02 NOTE — Assessment & Plan Note (Signed)
 Check TSH, continue Levothyroxine  150 mcg in the morning.

## 2024-07-02 NOTE — Assessment & Plan Note (Signed)
 PT therapy referral for lymphedema therapy done today after discussing this with patient and her son.  Continue Lasix  40 mg once a day.

## 2024-07-02 NOTE — Assessment & Plan Note (Signed)
 Miralax  if no BM for 4 days in a row, increase daily water in take to 40 oz per day, increase fiber in your diet, stool softer prn

## 2024-07-02 NOTE — Assessment & Plan Note (Signed)
 Continue f/u with pulmonology for management, no new symptoms, at baseline.

## 2024-07-02 NOTE — Assessment & Plan Note (Signed)
 Stable with Gabapentin  100 mg (2 capsule 3 times a day), refill sent.

## 2024-07-02 NOTE — Telephone Encounter (Signed)
 Patient scheduled with Dr Abbey for appointment today.

## 2024-07-02 NOTE — Patient Instructions (Signed)
 If no bowel movement for 4 days in a row take Miralax .

## 2024-07-02 NOTE — Assessment & Plan Note (Signed)
 Continue atorvastatin  40 mg, once a day Check lipid panel

## 2024-07-02 NOTE — Assessment & Plan Note (Signed)
 Stable on Protonix  40 mg once a day, continue.

## 2024-07-02 NOTE — Progress Notes (Signed)
 Established Patient Office Visit TOC from [facility]    Subjective  Patient ID: Jessica Erickson, female    DOB: 1926/09/10  Age: 89 y.o. MRN: [id number]  Chief Complaint  Patient presents with   Establish Care   Medication Refill    She  has a past medical history of Acoustic neuroma (HCC), Allergy, Asthma, Bilateral swelling of feet, Bladder infection, CAD (coronary artery disease), Cataract, Change in voice, Compression fracture of body of thoracic vertebra (HCC), Constipation, COPD (chronic obstructive pulmonary disease) (HCC), CVA (cerebral vascular accident) (HCC), Depression, Diabetes (HCC), Diabetes mellitus, type 2 (HCC), Diarrhea, Double vision, DVT (deep venous thrombosis) (HCC), Enuresis, Eye pain, right, Fall, Fatty liver, Female stress incontinence, Flank pain, GERD (gastroesophageal reflux disease), Hard of hearing, Heart disease, History of kidney problems, Hyperlipidemia, Hypertension, Hypothyroidism, postsurgical, Impaired mobility and ADLs, Leg edema, Mixed incontinence urge and stress (female)(female), Neuropathy, Osteoarthritis, Osteoporosis with fracture, Photophobia, Pulmonary embolism (HCC), Pulmonary HTN (HCC), Recurrent UTI, Sinus pressure, Skin cancer, Thyroid  disease, TIA (transient ischemic attack), Trigeminal neuralgia, Urinary frequency, and Urinary, incontinence, stress female.  HPI Discussed the use of AI scribe software for clinical note transcription with the patient, who gave verbal consent to proceed.  History of Present Illness Jessica Erickson is a 89 year old female who presents to establish care. She is accompanied by her son, [name], who is her primary caregiver.  - She has COPD, Bronchiectasis, established with pulmonology. On 1l/min oxygen  on exertion. No c/o cough, chest pain.   - She has been deaf for approximately four years.  Communication is facilitated through a whiteboard and talk-to-text on her son's phone.  - Chronic b/l lower leg edema: On Lasix   40 mg daily. Has tried compression stockings but does not like how it feels.    - She is on Eliquis  2.5 mg (CKD) due to a history of deep vein thrombosis (DVT).   - She had trigeminal neuralgia: She has been taking gabapentin  (Neurontin ) 200 mg three times a day (8 AM, 4 PM, and bedtime) since 2011.  She underwent gamma knife treatment for this condition.  - She takes mirtazapine  at bedtime, for mood but also helps with appetite. Son is worried she is not eating well, patient also reports of not having a good appetite.   - She had shingles on June 02, 2024, treated with Valtrex . The rash was located on her side and back, and she used lidocaine  for pain management. The rash has mostly resolved.  - She experiences constipation and takes a stool softener (Colace) every morning. She uses Miralax  as needed if she does not have a bowel movement for three to four days. She takes Pantoprazole  40 mg daily for GERD.  - She is active, using a walker, and engages in activities like crocheting and word puzzles.   - She has hypothyroidism and takes Levothyroxine  150 mcg daily in the morning for it.   ROS As per HPI    Objective:     BP 132/70 (BP Location: Right Arm, Patient Position: Sitting, Cuff Size: Normal)   Pulse 62   Temp 98.8 F (37.1 C) (Oral)   Wt 179 lb 12.8 oz (81.6 kg)   LMP  (LMP Unknown)   SpO2 94%   BMI 28.16 kg/m      07/02/2024    3:01 PM 01/21/2024   11:39 AM 10/15/2023    3:56 PM  Depression screen PHQ 2/9  Decreased Interest 0 0 0  Down, Depressed, Hopeless 0 0  0  PHQ - 2 Score 0 0 0  Altered sleeping 0 0 0  Tired, decreased energy 0 0 0  Change in appetite 0 0 0  Feeling bad or failure about yourself  0 0 0  Trouble concentrating 0 0 0  Moving slowly or fidgety/restless 0 0 0  Suicidal thoughts 0 0 0  PHQ-9 Score 0 0 0  Difficult doing work/chores Not difficult at all Not difficult at all Not difficult at all      07/02/2024    3:02 PM 10/15/2023    3:56 PM  09/11/2023   11:11 AM 08/14/2023    4:11 PM  GAD 7 : Generalized Anxiety Score  Nervous, Anxious, on Edge 0 0 0 0  Control/stop worrying 0 0 0 0  Worry too much - different things 0 0 0 0  Trouble relaxing 0 0 0 1  Restless 0 0 0 1  Easily annoyed or irritable 0 0 0 1  Afraid - awful might happen 0 0 0 0  Total GAD 7 Score 0 0 0 3  Anxiety Difficulty Not difficult at all Not difficult at all Somewhat difficult       07/02/2024    3:01 PM 01/21/2024   11:39 AM 10/15/2023    3:56 PM  Depression screen PHQ 2/9  Decreased Interest 0 0 0  Down, Depressed, Hopeless 0 0 0  PHQ - 2 Score 0 0 0  Altered sleeping 0 0 0  Tired, decreased energy 0 0 0  Change in appetite 0 0 0  Feeling bad or failure about yourself  0 0 0  Trouble concentrating 0 0 0  Moving slowly or fidgety/restless 0 0 0  Suicidal thoughts 0 0 0  PHQ-9 Score 0 0 0  Difficult doing work/chores Not difficult at all Not difficult at all Not difficult at all      07/02/2024    3:02 PM 10/15/2023    3:56 PM 09/11/2023   11:11 AM 08/14/2023    4:11 PM  GAD 7 : Generalized Anxiety Score  Nervous, Anxious, on Edge 0 0 0 0  Control/stop worrying 0 0 0 0  Worry too much - different things 0 0 0 0  Trouble relaxing 0 0 0 1  Restless 0 0 0 1  Easily annoyed or irritable 0 0 0 1  Afraid - awful might happen 0 0 0 0  Total GAD 7 Score 0 0 0 3  Anxiety Difficulty Not difficult at all Not difficult at all Somewhat difficult    SDOH Screenings   Food Insecurity: No Food Insecurity (01/21/2024)  Housing: Unknown (01/21/2024)  Transportation Needs: No Transportation Needs (01/21/2024)  Utilities: Not At Risk (01/21/2024)  Alcohol  Screen: Low Risk  (01/21/2024)  Depression (PHQ2-9): Low Risk  (07/02/2024)  Financial Resource Strain: Low Risk  (01/21/2024)  Physical Activity: Inactive (01/21/2024)  Social Connections: Moderately Isolated (01/21/2024)  Stress: No Stress Concern Present (01/21/2024)  Tobacco Use: Medium Risk (07/02/2024)   Health Literacy: Inadequate Health Literacy (01/21/2024)     Physical Exam Constitutional:      General: She is not in acute distress.    Comments: White board used for communication  HENT:     Head: Normocephalic and atraumatic.     Mouth/Throat:     Mouth: Mucous membranes are moist.  Cardiovascular:     Rate and Rhythm: Normal rate.  Pulmonary:     Effort: Pulmonary effort is normal.     Breath  sounds: No rales.  Abdominal:     General: Bowel sounds are normal.     Palpations: Abdomen is soft.     Tenderness: There is no abdominal tenderness. There is no guarding.  Musculoskeletal:     Cervical back: Normal range of motion and neck supple.     Right lower leg: Edema (2+ pitting edema) present.     Left lower leg: Edema (2+ pitting edema) present.  Neurological:     Mental Status: She is alert and oriented to person, place, and time.     Gait: Gait abnormal (antalgic gait, using walker for ambulation).  Psychiatric:        Mood and Affect: Mood normal.        No results found for any visits on 07/02/24.  The ASCVD Risk score (Arnett DK, et al., 2019) failed to calculate for the following reasons:   The 2019 ASCVD risk score is only valid for ages 32 to 61   Risk score cannot be calculated because patient has a medical history suggesting prior/existing ASCVD     Assessment & Plan:  Assessment and Plan  History of deep vein thrombosis (DVT) of lower extremity -     Apixaban ; Take 1 tablet (2.5 mg total) by mouth 2 (two) times daily.  Dispense: 180 tablet; Refill: 3  Bilateral leg edema Assessment & Plan: PT therapy referral for lymphedema therapy done today after discussing this with patient and her son.  Continue Lasix  40 mg once a day.    Orders: -     Furosemide ; Take 1 tablet (40 mg total) by mouth daily.  Dispense: 120 tablet; Refill: 3 -     Ambulatory referral to Physical Therapy  Trigeminal neuralgia Assessment & Plan: Stable with Gabapentin  100  mg (2 capsule 3 times a day), refill sent.   Orders: -     Gabapentin ; TAKE 2 CAPSULES BY MOUTH 3 TIMES DAILY.  Dispense: 504 capsule; Refill: 3  Hypothyroidism, unspecified type Assessment & Plan: Check TSH, continue Levothyroxine  150 mcg in the morning.   Orders: -     Levothyroxine  Sodium; Take 1 tablet (150 mcg total) by mouth daily before breakfast.  Dispense: 90 tablet; Refill: 1 -     TSH  Recurrent major depressive disorder, in full remission (HCC) Assessment & Plan: Stable with Mirtazapine  15 mg, recommend increasing dose to 30 mg at bedtime to help with appetite as well. Patient and son agreeable. Recommend eating small, frequent, nutrition dense food. Recommend dietitian referral if patient continues to have loss of appetite   Orders: -     Mirtazapine ; Take 1 tablet (30 mg total) by mouth at bedtime.  Dispense: 90 tablet; Refill: 3  Gastroesophageal reflux disease -     Pantoprazole  Sodium; Take 1 tablet (40 mg total) by mouth daily.  Dispense: 90 tablet; Refill: 3  Prediabetes -     Hemoglobin A1c  Stage 3a chronic kidney disease (HCC) Assessment & Plan: Increase water intake daily to help with constipation as well. Check CMP today. Avoid NSAIDs   Orders: -     Comprehensive metabolic panel with GFR  Constipation, unspecified constipation type Assessment & Plan: Miralax  if no BM for 4 days in a row, increase daily water in take to 40 oz per day, increase fiber in your diet, stool softer prn  Orders: -     Polyethylene Glycol 3350 ; Take 17 g by mouth daily as needed.  Dispense: 1530 g; Refill: 1  Pure hypercholesterolemia,  unspecified Assessment & Plan: Continue atorvastatin  40 mg, once a day Check lipid panel  Orders: -     Atorvastatin  Calcium ; Take 1 tablet (40 mg total) by mouth daily.  Dispense: 90 tablet; Refill: 3 -     Lipid panel  Centrilobular emphysema (HCC) Assessment & Plan: Continue f/u with pulmonology for management, no new symptoms, at  baseline.    I spent 50 minutes on the day of this face-to-face encounter reviewing the patient's medical and surgical history, medications, ongoing concerns, and reviewing the assessment and plan with the patient and her son. This time also included counseling the patient on their health conditions and management options. Additionally, I spent time post-visit ordering and reviewing diagnostics and therapeutics with the patient/guardian her son.  Return in about 4 months (around 11/02/2024) for Chronic .   Luke Shade, MD

## 2024-07-02 NOTE — Assessment & Plan Note (Signed)
 Stable with Mirtazapine  15 mg, recommend increasing dose to 30 mg at bedtime to help with appetite as well. Patient and son agreeable. Recommend eating small, frequent, nutrition dense food. Recommend dietitian referral if patient continues to have loss of appetite

## 2024-07-03 ENCOUNTER — Other Ambulatory Visit: Payer: Self-pay

## 2024-07-03 ENCOUNTER — Ambulatory Visit: Payer: Self-pay

## 2024-07-03 LAB — LIPID PANEL
Cholesterol: 129 mg/dL (ref 0–200)
HDL: 53.5 mg/dL (ref >39.00)
LDL Cholesterol: 58 mg/dL (ref 0–99)
NonHDL: 75.07
Total CHOL/HDL Ratio: 2
Triglycerides: 85 mg/dL (ref 0.0–149.0)
VLDL: 17 mg/dL (ref 0.0–40.0)

## 2024-07-03 LAB — COMPREHENSIVE METABOLIC PANEL WITH GFR
ALT: 9 U/L (ref 0–35)
AST: 19 U/L (ref 0–37)
Albumin: 3.7 g/dL (ref 3.5–5.2)
Alkaline Phosphatase: 56 U/L (ref 39–117)
BUN: 16 mg/dL (ref 6–23)
CO2: 40 meq/L — ABNORMAL HIGH (ref 19–32)
Calcium: 8.9 mg/dL (ref 8.4–10.5)
Chloride: 99 meq/L (ref 96–112)
Creatinine, Ser: 1.35 mg/dL — ABNORMAL HIGH (ref 0.40–1.20)
GFR: 33.24 mL/min — ABNORMAL LOW (ref >60.00)
Glucose, Bld: 102 mg/dL — ABNORMAL HIGH (ref 70–99)
Potassium: 4.1 meq/L (ref 3.5–5.1)
Sodium: 145 meq/L (ref 135–145)
Total Bilirubin: 0.4 mg/dL (ref 0.2–1.2)
Total Protein: 6.2 g/dL (ref 6.0–8.3)

## 2024-07-03 LAB — TSH: TSH: 0.7 u[IU]/mL (ref 0.35–5.50)

## 2024-07-03 LAB — HEMOGLOBIN A1C: Hgb A1c MFr Bld: 6 % (ref 4.6–6.5)

## 2024-07-03 MED ORDER — MIRTAZAPINE 30 MG PO TABS: 30.0000 mg | ORAL_TABLET | Freq: Every day | ORAL | 3 refills | Status: DC

## 2024-07-03 NOTE — Progress Notes (Signed)
 Noted.  Jacklin Mascot, MD

## 2024-07-03 NOTE — Progress Notes (Signed)
 Please let patient's son [name]/caregiver  know I reviewed patient's recent lab results and have following recommendations:  - Kidney function shows stable GFR, mild elevation in creatinine compared to 1 month ago. Patient was last seen by nephrologist at [facility] nephrology (Dr. Pinkey Edman) in 01/16/2022 who recommended patient follow up with them in 4 months. She is due for follow up with nephrology. They can call nephrology office at [phone] to schedule an appointment.   - TSH, HbA1c, cholesterol looks stable.   - Recommend no change in medication at this time.   Thank you,  Luke Shade, MD

## 2024-07-12 ENCOUNTER — Ambulatory Visit
Admission: EM | Admit: 2024-07-12 | Discharge: 2024-07-12 | Disposition: A | Discharge status: Home / Self Care | Attending: Emergency Medicine | Admitting: Emergency Medicine

## 2024-07-12 ENCOUNTER — Ambulatory Visit

## 2024-07-12 DIAGNOSIS — H1033 Unspecified acute conjunctivitis, bilateral: Secondary | ICD-10-CM

## 2024-07-12 MED ORDER — POLYMYXIN B-TRIMETHOPRIM 10000-0.1 UNIT/ML-% OP SOLN: 1.0000 [drp] | Freq: Four times a day (QID) | OPHTHALMIC | 0 refills | Status: AC

## 2024-07-12 NOTE — ED Triage Notes (Signed)
 Patient to Urgent Care with complaints of bilateral eye itching/ redness. Symptoms x3 weeks. Caregiver reports she has been continuous rubbing at her eyes.   Using refresh lubricant eye drops x2 daily/ warm compresses.

## 2024-07-12 NOTE — Discharge Instructions (Addendum)
 Use the antibiotic eyedrops as prescribed.    Follow-up with your primary care provider or eye doctor if your symptoms are not improving.    Go to the emergency department if you have acute eye pain, changes in your vision, or other concerning symptoms.

## 2024-07-12 NOTE — ED Provider Notes (Signed)
 [name]    CSN: [id number] Arrival date & time: 07/12/24  1103      History   Chief Complaint Chief Complaint  Patient presents with   Eye Problem    HPI NICKIA BOESEN is a 89 y.o. female.  Accompanied by her caregiver, patient presents with bilateral eye redness, itching, drainage x 3 weeks.  She has been treating her symptoms with lubricant eyedrops.  She denies eye pain, change in vision, fever, purulent drainage.  Patient has impaired hearing and uses a whiteboard for communication; she reads and answers questions appropriately.  The history is provided by the patient, a caregiver and medical records.    Past Medical History:  Diagnosis Date   Acoustic neuroma (HCC)    Allergy    Asthma    Bilateral swelling of feet    and legs   Bladder infection    CAD (coronary artery disease)    Cataract    Change in voice    Compression fracture of body of thoracic vertebra (HCC)    T12 09/18/15 MRI s/p fall    Constipation    COPD (chronic obstructive pulmonary disease) (HCC)    previous CXR with chronic interstitial lung dz    CVA (cerebral vascular accident) (HCC)    Depression    Diabetes (HCC)    with neuropathy   Diabetes mellitus, type 2 (HCC)    Diarrhea    Double vision    DVT (deep venous thrombosis) (HCC)    right leg 10/2015 was on coumadin  off as of 2017/2018 ; s/p IVC filter   Enuresis    Eye pain, right    Fall    Fatty liver    09/15/15 also mildly dilated pancreatitic duct rec MRCP small sub cm cyst hemangioma speeln mild right hydronephrorossi and prox. hydroureter, kidney stones, mild scarring kidneys   Female stress incontinence    Flank pain    GERD (gastroesophageal reflux disease)    with small hiatal hernia    Hard of hearing    Heart disease    History of kidney problems    Hyperlipidemia    mixed   Hypertension    Hypothyroidism, postsurgical    Impaired mobility and ADLs    uses rolling walker has caretaker 24/7 at home    Leg edema    Mixed incontinence urge and stress (female)(female)    Neuropathy    Osteoarthritis    DDD spine    Osteoporosis with fracture    T12 compression fracture   Photophobia    Pulmonary embolism (HCC)    10/2015 off coumadin  as of 04/2016   Pulmonary HTN (HCC)    mild pulm HTN, echo 10/09/15 EF 55-60%grade 1 dd, RV systolic pressure increased    Recurrent UTI    Sinus pressure    Skin cancer    BCC jawline and scalp    Thyroid  disease    follows [facility]   TIA (transient ischemic attack)    MRI 2009/2010 neg stroke    Trigeminal neuralgia    Dr. Nancye Meeter s/p gamma knife x 2, on Tegretol  since 2011/2012 no increase in dose >200 mg bid rec per family per neurology    Urinary frequency    Urinary, incontinence, stress female    Dr Penne urology     Patient Active Problem List   Diagnosis Date Noted   Atrophic endometrium 02/20/2024   Blood thinned due to long-term anticoagulant use 02/20/2024  Hematuria 02/11/2024   Abnormal vaginal bleeding in postmenopausal patient 02/06/2024   MRSA pneumonia (HCC) 10/07/2023   Acute exacerbation of bronchiectasis (HCC) 09/26/2023   Urination frequency 08/19/2023   SOB (shortness of breath) 08/19/2023   Vaso vagal episode 04/02/2023   Acute respiratory failure with hypoxia and hypercapnia (HCC) 11/06/2022   Chronic heart failure with preserved ejection fraction (HFpEF) (HCC) 11/06/2022   FTT (failure to thrive) in adult 10/25/2022   Dyslipidemia 10/24/2022   Hypothyroidism 10/24/2022   GERD without esophagitis 10/24/2022   Hearing loss 09/01/2022   DNR (do not resuscitate) 08/01/2022   Abnormal gait 10/19/2021   At high risk for falls 10/14/2021   Spinal stenosis of lumbar region 09/30/2021   Pain in both lower extremities 09/30/2021   Osteoarthritis 09/30/2021   Lumbar and sacral arthritis 09/30/2021   Pure hypercholesterolemia, unspecified 09/15/2021   Coronary atherosclerosis 09/15/2021   Hearing decreased,  bilateral 08/22/2021   Chronic respiratory failure with hypoxia and hypercapnia (HCC) 08/14/2021   Shortness of breath 08/13/2021   CKD (chronic kidney disease), stage III (HCC) 08/13/2021   History of CVA (cerebrovascular accident) 08/13/2021   Prediabetes 06/29/2021   Pulmonary artery hypertension (HCC) 05/03/2021   Grade I diastolic dysfunction 08/16/2020   Obesity (BMI 30-39.9) 08/16/2020   Chronic kidney disease, stage 3b (HCC) 04/06/2020   Thrombocytopenia (HCC) 04/06/2020   Seborrheic keratoses 04/06/2020   Hip pain 09/30/2019   History of deep vein thrombosis (DVT) of lower extremity 09/30/2019   Arthritis 09/30/2019   Fall 03/11/2019   History of DVT (deep vein thrombosis) 11/12/2018   Bilateral leg edema 06/04/2018   Hearing loss of both ears 06/04/2018   Actinic keratosis 06/04/2018   Bronchiectasis (HCC) 06/04/2018   Depression 05/17/2018   Dehydration 05/15/2018   Hypertension 05/03/2018   Lymphedema 05/02/2018   Iron deficiency anemia 05/02/2018   Presence of IVC filter 05/02/2018   Anemia 04/11/2018   Fatigue 04/11/2018   Moderate protein-calorie malnutrition (HCC) 03/22/2018   Orthostasis 03/05/2018   Dizziness 03/04/2018   COPD (chronic obstructive pulmonary disease) (HCC) 12/13/2017   Anxiety and depression 12/13/2017   Trigeminal neuralgia 12/13/2017   Constipation 12/13/2017   Insomnia 12/13/2017   Low back pain 10/08/2015   Collapsed vertebra, not elsewhere classified, thoracic region, initial encounter for fracture (HCC) 09/20/2015   Basal cell carcinoma of scalp 04/06/2014   Fothergill's neuralgia 08/05/2013   Bladder infection, chronic 02/11/2013   Female genuine stress incontinence 02/11/2013   Incomplete bladder emptying 02/11/2013   Intrinsic sphincter deficiency 02/11/2013   Mixed incontinence 02/11/2013   Bladder retention 02/11/2013   FOM (frequency of micturition) 02/11/2013   Basal cell carcinoma of face 09/13/2011    Past Surgical  History:  Procedure Laterality Date   APPENDECTOMY     as a child, open   BRAIN SURGERY     schwnnoma removal 1996    brain tumor surgery     BREAST SURGERY     breast bx   CATARACT EXTRACTION     CHOLECYSTECTOMY     EYE SURGERY     cataract   IVC FILTER PLACEMENT ([facility] HX)     Dr. Marea 10/2015    LAPAROSCOPIC TUBAL LIGATION     MOHS SURGERY     scalp 04/2014    PERIPHERAL VASCULAR CATHETERIZATION N/A 10/11/2015   Procedure: IVC Filter Insertion;  Surgeon: Selinda GORMAN Marea, MD;  Location: [facility];  Service: Cardiovascular;  Laterality: N/A;   PERIPHERAL VASCULAR THROMBECTOMY Bilateral  03/29/2018   Procedure: PERIPHERAL VASCULAR THROMBECTOMY;  Surgeon: Marea Selinda RAMAN, MD;  Location: [facility];  Service: Cardiovascular;  Laterality: Bilateral;   PUBOVAGINAL SLING     THROAT SURGERY     THYROID  SURGERY     tumor around vocal cords    TOOTH EXTRACTION     winter 2018    TOTAL THYROIDECTOMY  1976    OB History     Gravida  5   Para  5   Term  5   Preterm      AB      Living  5      SAB      IAB      Ectopic      Multiple      Live Births               Home Medications    Prior to Admission medications   Medication Sig Start Date End Date Taking? Authorizing Provider  trimethoprim -polymyxin b  (POLYTRIM ) ophthalmic solution Place 1 drop into both eyes 4 (four) times daily for 7 days. 07/12/24 07/19/24 Yes Corlis Burnard DEL, NP  ACCU-CHEK GUIDE TEST test strip USE TO TEST BLOOD SUGAR TWICE DAILY 03/11/24   Marylynn Verneita CROME, MD  Accu-Chek Softclix Lancets lancets Use as instructed 10/11/22   Webb, Padonda B, FNP  acetaminophen  (TYLENOL ) 325 MG tablet Take 650 mg by mouth 2 (two) times daily as needed for mild pain (pain score 1-3).    [provider]  apixaban  (ELIQUIS ) 2.5 MG TABS tablet Take 1 tablet (2.5 mg total) by mouth 2 (two) times daily. 07/02/24   Bair, Kalpana, MD  arformoterol  (BROVANA ) 15 MCG/2ML NEBU Take 2 mLs (15 mcg total) by  nebulization 2 (two) times daily. 06/11/24   Assaker, Darrin, MD  atorvastatin  (LIPITOR) 40 MG tablet Take 1 tablet (40 mg total) by mouth daily. 07/02/24   Bair, Kalpana, MD  bisacodyl  (DULCOLAX) 5 MG EC tablet Take 5 mg by mouth daily as needed for moderate constipation.    [provider]  Blood Glucose Monitoring Suppl (ACCU-CHEK AVIVA PLUS) w/Device KIT Use as directed 09/22/21   McLean-Scocuzza, Randine SAILOR, MD  budesonide  (PULMICORT ) 0.25 MG/2ML nebulizer solution Take 2 mLs (0.25 mg total) by nebulization 2 (two) times daily. 06/11/24 06/11/25  Assaker, Darrin, MD  calcium -vitamin D  (OSCAL WITH D) 500-5 MG-MCG tablet Take 1 tablet by mouth 2 (two) times daily. Unable to find former dose 600ca/400 vitamin D  inform patient dose change Oscal 09/19/23   Maribeth Camellia MATSU, MD  cetirizine  (ZYRTEC ) 10 MG tablet TAKE 1 TABLET BY MOUTH DAILY. 12/19/23   Maribeth Camellia MATSU, MD  docusate sodium  (COLACE) 100 MG capsule Take 100 mg by mouth 2 (two) times daily.    [provider]  feeding supplement, GLUCERNA SHAKE, (GLUCERNA SHAKE) LIQD Take 237 mLs by mouth 2 (two) times daily between meals. 08/04/21   Danford, Lonni SQUIBB, MD  furosemide  (LASIX ) 40 MG tablet Take 1 tablet (40 mg total) by mouth daily. 07/02/24   Bair, Kalpana, MD  gabapentin  (NEURONTIN ) 100 MG capsule TAKE 2 CAPSULES BY MOUTH 3 TIMES DAILY. 07/02/24   Bair, Kalpana, MD  guaiFENesin  (MUCINEX ) 600 MG 12 hr tablet Take 2 tablets (1,200 mg total) by mouth 2 (two) times daily as needed for cough or to loosen phlegm. Patient taking differently: Take 600 mg by mouth 2 (two) times daily as needed for cough or to loosen phlegm. 05/05/21   Sood, Vineet,  MD  ipratropium-albuterol  (DUONEB) 0.5-2.5 (3) MG/3ML SOLN TAKE 3 ML BY NEBULIZATION EVERY 4 HOURS AS NEEDED 02/20/23   Maribeth Camellia MATSU, MD  levothyroxine  (SYNTHROID ) 150 MCG tablet Take 1 tablet (150 mcg total) by mouth daily before breakfast. 07/02/24   Bair, Luke, MD  lidocaine   (XYLOCAINE ) 5 % ointment Apply 1 Application topically 2 (two) times daily as needed. 06/24/24   Glendia Shad, MD  Melatonin 10 MG TABS Take 10 mg by mouth at bedtime. 11/07/21   McLean-Scocuzza, Randine SAILOR, MD  mirtazapine  (REMERON ) 30 MG tablet Take 1 tablet (30 mg total) by mouth at bedtime. 07/03/24   Bair, Kalpana, MD  Multiple Vitamin (MULTIVITAMIN WITH MINERALS) TABS tablet Take 1 tablet by mouth daily.     [provider]  OXYGEN  Inhale 2 L/min into the lungs continuous.    [provider]  pantoprazole  (PROTONIX ) 40 MG tablet Take 1 tablet (40 mg total) by mouth daily. 07/02/24   Bair, Kalpana, MD  polyethylene glycol powder (GLYCOLAX /MIRALAX ) 17 GM/SCOOP powder Take 17 g by mouth daily as needed. 07/02/24   Bair, Kalpana, MD  polyvinyl alcohol  (LIQUIFILM TEARS) 1.4 % ophthalmic solution Place 1 drop into both eyes at bedtime. 08/04/21   Danford, Lonni SQUIBB, MD  revefenacin  (YUPELRI ) 175 MCG/3ML nebulizer solution Take 3 mLs (175 mcg total) by nebulization daily. 06/11/24   Assaker, Darrin, MD  revefenacin  (YUPELRI ) 175 MCG/3ML nebulizer solution Take 3 mLs (175 mcg total) by nebulization daily. 06/11/24   Malka Darrin, MD    Family History Family History  Problem Relation Age of Onset   Heart disease Mother    Diabetes Father    Cancer Daughter        breast ca x 2 s/p mastectomy     Social History Social History   Tobacco Use   Smoking status: Former    Current packs/day: 0.00    Average packs/day: 0.5 packs/day for 20.0 years (10.0 ttl pk-yrs)    Types: Cigarettes    Start date: 09/20/1975    Quit date: 09/20/1995    Years since quitting: 28.8   Smokeless tobacco: Never   Tobacco comments:    quit 1996 smoked 20 years max 8 cig qd   Vaping Use   Vaping status: Never Used  Substance Use Topics   Alcohol  use: No   Drug use: No     Allergies   Penicillins, Sulfa antibiotics, Amitiza [lubiprostone], Aspirin , and Penicillin g   Review of  Systems Review of Systems  Constitutional:  Negative for chills and fever.  Eyes:  Positive for discharge, redness and itching. Negative for pain and visual disturbance.     Physical Exam Triage Vital Signs ED Triage Vitals  Encounter Vitals Group     BP 07/12/24 1140 111/69     Girls Systolic BP Percentile --      Girls Diastolic BP Percentile --      Boys Systolic BP Percentile --      Boys Diastolic BP Percentile --      Pulse Rate 07/12/24 1140 66     Resp 07/12/24 1140 18     Temp 07/12/24 1140 97.9 F (36.6 C)     Temp src --      SpO2 07/12/24 1140 95 %     Weight --      Height --      Head Circumference --      Peak Flow --      Pain Score 07/12/24 1155  0     Pain Loc --      Pain Education --      Exclude from Growth Chart --    No data found.  Updated Vital Signs BP 111/69   Pulse 66   Temp 97.9 F (36.6 C)   Resp 18   LMP  (LMP Unknown)   SpO2 95%   Visual Acuity Right Eye Distance: UNABLE Left Eye Distance: UNABLE Bilateral Distance: UNABLE  Right Eye Near:   Left Eye Near:    Bilateral Near:     Physical Exam Constitutional:      General: She is not in acute distress. HENT:     Mouth/Throat:     Mouth: Mucous membranes are moist.  Eyes:     General: Lids are normal.        Right eye: No discharge.        Left eye: No discharge.     Extraocular Movements: Extraocular movements intact.     Conjunctiva/sclera:     Right eye: Right conjunctiva is injected.     Left eye: Left conjunctiva is injected.     Pupils: Pupils are equal, round, and reactive to light.     Comments: Both conjunctiva injected.  Cardiovascular:     Rate and Rhythm: Normal rate and regular rhythm.     Heart sounds: Normal heart sounds.  Pulmonary:     Effort: Pulmonary effort is normal. No respiratory distress.     Breath sounds: Normal breath sounds.  Neurological:     Mental Status: She is alert.      UC Treatments / Results  Labs (all labs ordered are  listed, but only abnormal results are displayed) Labs Reviewed - No data to display  EKG   Radiology No results found.  Procedures Procedures (including critical care time)  Medications Ordered in UC Medications - No data to display  Initial Impression / Assessment and Plan / UC Course  I have reviewed the triage vital signs and the nursing notes.  Pertinent labs & imaging results that were available during my care of the patient were reviewed by me and considered in my medical decision making (see chart for details).    Conjunctivitis.  Treating with Polytrim  eyedrops.  Education provided on conjunctivitis.  Instructed patient to follow-up with PCP or eye doctor if symptoms are not improving.  ED precautions given.  Patient and caregiver agree to plan of care.   Final Clinical Impressions(s) / UC Diagnoses   Final diagnoses:  Acute bacterial conjunctivitis of both eyes     Discharge Instructions      Use the antibiotic eyedrops as prescribed.    Follow-up with your primary care provider or eye doctor if your symptoms are not improving.    Go to the emergency department if you have acute eye pain, changes in your vision, or other concerning symptoms.        ED Prescriptions     Medication Sig Dispense Auth. Provider   trimethoprim -polymyxin b  (POLYTRIM ) ophthalmic solution Place 1 drop into both eyes 4 (four) times daily for 7 days. 10 mL Corlis Burnard DEL, NP      PDMP not reviewed this encounter.   Corlis Burnard DEL, NP 07/12/24 [phone]

## 2024-07-14 ENCOUNTER — Encounter: Payer: Self-pay | Admitting: Podiatry

## 2024-07-14 ENCOUNTER — Ambulatory Visit: Admitting: Podiatry

## 2024-07-14 DIAGNOSIS — M79676 Pain in unspecified toe(s): Secondary | ICD-10-CM | POA: Diagnosis not present

## 2024-07-14 DIAGNOSIS — B351 Tinea unguium: Secondary | ICD-10-CM

## 2024-07-14 DIAGNOSIS — E1140 Type 2 diabetes mellitus with diabetic neuropathy, unspecified: Secondary | ICD-10-CM

## 2024-07-14 DIAGNOSIS — D689 Coagulation defect, unspecified: Secondary | ICD-10-CM | POA: Diagnosis not present

## 2024-07-20 ENCOUNTER — Encounter: Payer: Self-pay | Admitting: Podiatry

## 2024-07-20 NOTE — Progress Notes (Signed)
 Subjective:  Patient ID: Jessica Erickson, female    DOB: 26-Dec-1925,  MRN: [id number]  Jessica Erickson presents to clinic today for: for annual diabetic foot examination and painful mycotic toenails of both feet that are difficult to trim. Pain interferes with daily activities and wearing enclosed shoe gear comfortably.  Chief Complaint  Patient presents with   [facility]    Rm4 Diabetic foot care/ Dr. Abbey last visit July 2025 A1C 6.0    PCP is Bair, Luke, MD.  Allergies  Allergen Reactions   Penicillins Shortness Of Breath, Rash and Other (See Comments)    Has patient had a PCN reaction causing immediate rash, facial/tongue/throat swelling, SOB or lightheadedness with hypotension: Yes Has patient had a PCN reaction causing severe rash involving mucus membranes or skin necrosis: No Has patient had a PCN reaction that required hospitalization No Has patient had a PCN reaction occurring within the last 10 years: No If all of the above answers are NO, then may proceed with Cephalosporin use.   Sulfa Antibiotics Shortness Of Breath, Rash and Other (See Comments)   Amitiza [Lubiprostone]     N/v/d   Aspirin  Other (See Comments)    Reaction:  Unknown  Other reaction(s): Bleeding (intolerance) Per patient  causes nose to bleed Can take 81 mg daily without any complications Other reaction(s): bloody nose    Penicillin G Other (See Comments)    Has patient had a PCN reaction causing immediate rash, facial/tongue/throat swelling, SOB or lightheadedness with hypotension: No Has patient had a PCN reaction causing severe rash involving mucus membranes or skin necrosis: Unknown Has patient had a PCN reaction that required hospitalization: Unknown Has patient had a PCN reaction occurring within the last 10 years: Unknown If all of the above answers are NO, then may proceed with Cephalosporin use.    Review of Systems: Negative except as noted in the HPI.  Objective: No changes noted in  today's physical examination. There were no vitals filed for this visit.  Jessica Erickson is a pleasant 89 y.o. female in NAD. AAO x 3.  Vascular Examination: Capillary refill time <3 seconds b/l LE. Palpable pedal pulses b/l LE. Digital hair present b/l. No pedal edema b/l. Skin temperature gradient WNL b/l. No varicosities b/l. No cyanosis or clubbing. No ischemia or gangrene. .  Dermatological Examination: Pedal skin with normal turgor, texture and tone b/l. No open wounds. No interdigital macerations b/l. Toenails 1-5 b/l thickened, discolored, dystrophic with subungual debris. There is pain on palpation to dorsal aspect of nailplates. No corns, calluses, nor porokeratotic lesions.  Neurological Examination: Protective sensation intact with 10 gram monofilament b/l LE. Vibratory sensation intact b/l LE. Pt has subjective symptoms of neuropathy.  Musculoskeletal Examination: Muscle strength 5/5 to all lower extremity muscle groups bilaterally. No pain, crepitus or joint limitation noted with ROM bilateral LE. No gross bony deformities bilaterally.     Latest Ref Rng & Units 07/02/2024    3:47 PM  Hemoglobin A1C  Hemoglobin-A1c 4.6 - 6.5 % 6.0    Assessment/Plan: 1. Pain due to onychomycosis of toenail   2. Blood clotting disorder (HCC)   3. Type 2 diabetes mellitus with diabetic neuropathy, without long-term current use of insulin  (HCC)   Diabetic foot examination performed today.  All patient's and/or POA's questions/concerns addressed on today's visit. Mycotic toenails 1-5 debrided in length and girth without incident. No dremel file available. Continue daily foot inspections and monitor blood glucose per PCP/Endocrinologist's recommendations. Continue soft, supportive  shoe gear daily. Report any pedal injuries to medical professional. Call office if there are any questions/concerns. -Patient/POA to call should there be question/concern in the interim.   Return in about 3 months  (around 10/14/2024).  Delon LITTIE Merlin, DPM      [place] LOCATION: [address] [place] [address]                   Office [phone]   [facility] LOCATION: [address] [place], [place] [address] Office [phone]

## 2024-07-24 ENCOUNTER — Ambulatory Visit

## 2024-08-02 ENCOUNTER — Ambulatory Visit
Admission: RE | Admit: 2024-08-02 | Discharge: 2024-08-02 | Disposition: A | Discharge status: Home / Self Care | Source: Ambulatory Visit | Attending: Emergency Medicine | Admitting: Emergency Medicine

## 2024-08-02 ENCOUNTER — Ambulatory Visit (INDEPENDENT_AMBULATORY_CARE_PROVIDER_SITE_OTHER)

## 2024-08-02 VITALS — BP 111/69 | HR 60 | Temp 98.3°F | Resp 16

## 2024-08-02 DIAGNOSIS — M7989 Other specified soft tissue disorders: Secondary | ICD-10-CM

## 2024-08-02 NOTE — Discharge Instructions (Signed)
 Today you were evaluated for your leg swelling  \Due to the increase of swelling from baseline it is concerning that this is related to your heart failure  Chest x-ray has been completed and you will be called with results and instructed on how to move forward  If there is a large amount of fluid within the lungs you will be sent to the emergency department as you will most likely need IV medications for management  If there is only a small amount of fluid then we will increase your diuretic  Would like you to elevate your legs whenever sitting and lying to help with swelling  Would like you to wear compression hosiery or wear wraps to help remove fluid  Please notify your heart doctor or your primary doctor on Tuesday of increased swelling for follow-up appointment

## 2024-08-02 NOTE — ED Triage Notes (Signed)
 Patient complains of legs leaking fluid since Friday. Patient denies pain. Patient reports that she scratched it  because it was itching. Patient has not taken anything for symptoms. Patient also reports increased swelling.

## 2024-08-02 NOTE — ED Provider Notes (Signed)
 Jessica Erickson    CSN: [id number] Arrival date & time: 08/02/24  1147      History   Chief Complaint Chief Complaint  Patient presents with   Leg Swelling    Mom has some oozing in one leg - Entered by patient    HPI Jessica Erickson is a 89 y.o. female.   Patient presents for evaluation of increased bilateral leg swelling, worsening over the last 24 hours.  Has begun to experience weeping and pruritus.  Has swelling at baseline, history of CHF.  Denies respiratory symptoms.  Has not attempted treatment.  Has taken all medication as directed.  Accompanied by caregiver.  Patient Active Problem List   Diagnosis Date Noted   Atrophic endometrium 02/20/2024   Blood thinned due to long-term anticoagulant use 02/20/2024   Hematuria 02/11/2024   Abnormal vaginal bleeding in postmenopausal patient 02/06/2024   MRSA pneumonia (HCC) 10/07/2023   Acute exacerbation of bronchiectasis (HCC) 09/26/2023   Urination frequency 08/19/2023   SOB (shortness of breath) 08/19/2023   Vaso vagal episode 04/02/2023   Acute respiratory failure with hypoxia and hypercapnia (HCC) 11/06/2022   Chronic heart failure with preserved ejection fraction (HFpEF) (HCC) 11/06/2022   FTT (failure to thrive) in adult 10/25/2022   Dyslipidemia 10/24/2022   Hypothyroidism 10/24/2022   GERD without esophagitis 10/24/2022   Hearing loss 09/01/2022   DNR (do not resuscitate) 08/01/2022   Abnormal gait 10/19/2021   At high risk for falls 10/14/2021   Spinal stenosis of lumbar region 09/30/2021   Pain in both lower extremities 09/30/2021   Osteoarthritis 09/30/2021   Lumbar and sacral arthritis 09/30/2021   Pure hypercholesterolemia, unspecified 09/15/2021   Coronary atherosclerosis 09/15/2021   Hearing decreased, bilateral 08/22/2021   Chronic respiratory failure with hypoxia and hypercapnia (HCC) 08/14/2021   Shortness of breath 08/13/2021   CKD (chronic kidney disease), stage III (HCC) 08/13/2021    History of CVA (cerebrovascular accident) 08/13/2021   Prediabetes 06/29/2021   Pulmonary artery hypertension (HCC) 05/03/2021   Grade I diastolic dysfunction 08/16/2020   Obesity (BMI 30-39.9) 08/16/2020   Chronic kidney disease, stage 3b (HCC) 04/06/2020   Thrombocytopenia (HCC) 04/06/2020   Seborrheic keratoses 04/06/2020   Hip pain 09/30/2019   History of deep vein thrombosis (DVT) of lower extremity 09/30/2019   Arthritis 09/30/2019   Fall 03/11/2019   History of DVT (deep vein thrombosis) 11/12/2018   Bilateral leg edema 06/04/2018   Hearing loss of both ears 06/04/2018   Actinic keratosis 06/04/2018   Bronchiectasis (HCC) 06/04/2018   Depression 05/17/2018   Dehydration 05/15/2018   Hypertension 05/03/2018   Lymphedema 05/02/2018   Iron deficiency anemia 05/02/2018   Presence of IVC filter 05/02/2018   Anemia 04/11/2018   Fatigue 04/11/2018   Moderate protein-calorie malnutrition (HCC) 03/22/2018   Orthostasis 03/05/2018   Dizziness 03/04/2018   COPD (chronic obstructive pulmonary disease) (HCC) 12/13/2017   Anxiety and depression 12/13/2017   Trigeminal neuralgia 12/13/2017   Constipation 12/13/2017   Insomnia 12/13/2017   Low back pain 10/08/2015   Collapsed vertebra, not elsewhere classified, thoracic region, initial encounter for fracture (HCC) 09/20/2015   Basal cell carcinoma of scalp 04/06/2014   Fothergill's neuralgia 08/05/2013   Bladder infection, chronic 02/11/2013   Female genuine stress incontinence 02/11/2013   Incomplete bladder emptying 02/11/2013   Intrinsic sphincter deficiency 02/11/2013   Mixed incontinence 02/11/2013   Bladder retention 02/11/2013   FOM (frequency of micturition) 02/11/2013   Basal cell carcinoma of face 09/13/2011  Past Surgical History:  Procedure Laterality Date   APPENDECTOMY     as a child, open   BRAIN SURGERY     schwnnoma removal 1996    brain tumor surgery     BREAST SURGERY     breast bx   CATARACT  EXTRACTION     CHOLECYSTECTOMY     EYE SURGERY     cataract   IVC FILTER PLACEMENT ([facility] HX)     Dr. Marea 10/2015    LAPAROSCOPIC TUBAL LIGATION     MOHS SURGERY     scalp 04/2014    PERIPHERAL VASCULAR CATHETERIZATION N/A 10/11/2015   Procedure: IVC Filter Insertion;  Surgeon: Selinda GORMAN Marea, MD;  Location: [facility] INVASIVE CV [facility];  Service: Cardiovascular;  Laterality: N/A;   PERIPHERAL VASCULAR THROMBECTOMY Bilateral 03/29/2018   Procedure: PERIPHERAL VASCULAR THROMBECTOMY;  Surgeon: Marea Selinda GORMAN, MD;  Location: [facility] INV[facility];  Service: Cardiovascular;  Laterality: Bilateral;   PUBOVAGINAL SLING     THROAT SURGERY     THYROID  SURGERY     tumor around vocal cords    TOOTH EXTRACTION     winter 2018    TOTAL THYROIDECTOMY  1976    OB History     Gravida  5   Para  5   Term  5   Preterm      AB      Living  5      SAB      IAB      Ectopic      Multiple      Live Births               Home Medications    Prior to Admission medications   Medication Sig Start Date End Date Taking? Authorizing Provider  ACCU-CHEK GUIDE TEST test strip USE TO TEST BLOOD SUGAR TWICE DAILY 03/11/24   Marylynn Verneita CROME, MD  Accu-Chek Softclix Lancets lancets Use as instructed 10/11/22   Webb, Padonda B, FNP  acetaminophen  (TYLENOL ) 325 MG tablet Take 650 mg by mouth 2 (two) times daily as needed for mild pain (pain score 1-3).    [provider]  apixaban  (ELIQUIS ) 2.5 MG TABS tablet Take 1 tablet (2.5 mg total) by mouth 2 (two) times daily. 07/02/24   Bair, Kalpana, MD  arformoterol  (BROVANA ) 15 MCG/2ML NEBU Take 2 mLs (15 mcg total) by nebulization 2 (two) times daily. 06/11/24   Assaker, Darrin, MD  atorvastatin  (LIPITOR) 40 MG tablet Take 1 tablet (40 mg total) by mouth daily. 07/02/24   Bair, Kalpana, MD  bisacodyl  (DULCOLAX) 5 MG EC tablet Take 5 mg by mouth daily as needed for moderate constipation.    [provider]  Blood Glucose Monitoring Suppl  (ACCU-CHEK AVIVA PLUS) w/Device KIT Use as directed 09/22/21   McLean-Scocuzza, Randine SAILOR, MD  budesonide  (PULMICORT ) 0.25 MG/2ML nebulizer solution Take 2 mLs (0.25 mg total) by nebulization 2 (two) times daily. 06/11/24 06/11/25  Assaker, Darrin, MD  calcium -vitamin D  (OSCAL WITH D) 500-5 MG-MCG tablet Take 1 tablet by mouth 2 (two) times daily. Unable to find former dose 600ca/400 vitamin D  inform patient dose change Oscal 09/19/23   Maribeth Camellia MATSU, MD  cetirizine  (ZYRTEC ) 10 MG tablet TAKE 1 TABLET BY MOUTH DAILY. 12/19/23   Maribeth Camellia MATSU, MD  docusate sodium  (COLACE) 100 MG capsule Take 100 mg by mouth 2 (two) times daily.    [provider]  feeding supplement, GLUCERNA SHAKE, (GLUCERNA SHAKE) LIQD  Take 237 mLs by mouth 2 (two) times daily between meals. 08/04/21   Danford, Lonni SQUIBB, MD  furosemide  (LASIX ) 40 MG tablet Take 1 tablet (40 mg total) by mouth daily. 07/02/24   Bair, Kalpana, MD  gabapentin  (NEURONTIN ) 100 MG capsule TAKE 2 CAPSULES BY MOUTH 3 TIMES DAILY. 07/02/24   Bair, Kalpana, MD  guaiFENesin  (MUCINEX ) 600 MG 12 hr tablet Take 2 tablets (1,200 mg total) by mouth 2 (two) times daily as needed for cough or to loosen phlegm. Patient taking differently: Take 600 mg by mouth 2 (two) times daily as needed for cough or to loosen phlegm. 05/05/21   Sood, Vineet, MD  ipratropium-albuterol  (DUONEB) 0.5-2.5 (3) MG/3ML SOLN TAKE 3 ML BY NEBULIZATION EVERY 4 HOURS AS NEEDED 02/20/23   Maribeth Camellia MATSU, MD  levothyroxine  (SYNTHROID ) 150 MCG tablet Take 1 tablet (150 mcg total) by mouth daily before breakfast. 07/02/24   Bair, Kalpana, MD  lidocaine  (XYLOCAINE ) 5 % ointment Apply 1 Application topically 2 (two) times daily as needed. 06/24/24   Glendia Shad, MD  Melatonin 10 MG TABS Take 10 mg by mouth at bedtime. 11/07/21   McLean-Scocuzza, Randine SAILOR, MD  mirtazapine  (REMERON ) 30 MG tablet Take 1 tablet (30 mg total) by mouth at bedtime. 07/03/24   Bair, Kalpana, MD  Multiple  Vitamin (MULTIVITAMIN WITH MINERALS) TABS tablet Take 1 tablet by mouth daily.     [provider]  OXYGEN  Inhale 2 L/min into the lungs continuous.    [provider]  pantoprazole  (PROTONIX ) 40 MG tablet Take 1 tablet (40 mg total) by mouth daily. 07/02/24   Bair, Kalpana, MD  polyethylene glycol powder (GLYCOLAX /MIRALAX ) 17 GM/SCOOP powder Take 17 g by mouth daily as needed. 07/02/24   Bair, Kalpana, MD  polyvinyl alcohol  (LIQUIFILM TEARS) 1.4 % ophthalmic solution Place 1 drop into both eyes at bedtime. 08/04/21   Danford, Lonni SQUIBB, MD  revefenacin  (YUPELRI ) 175 MCG/3ML nebulizer solution Take 3 mLs (175 mcg total) by nebulization daily. 06/11/24   Assaker, Darrin, MD  revefenacin  (YUPELRI ) 175 MCG/3ML nebulizer solution Take 3 mLs (175 mcg total) by nebulization daily. 06/11/24   Malka Darrin, MD    Family History Family History  Problem Relation Age of Onset   Heart disease Mother    Diabetes Father    Cancer Daughter        breast ca x 2 s/p mastectomy     Social History Social History   Tobacco Use   Smoking status: Former    Current packs/day: 0.00    Average packs/day: 0.5 packs/day for 20.0 years (10.0 ttl pk-yrs)    Types: Cigarettes    Start date: 09/20/1975    Quit date: 09/20/1995    Years since quitting: 28.8   Smokeless tobacco: Never   Tobacco comments:    quit 1996 smoked 20 years max 8 cig qd   Vaping Use   Vaping status: Never Used  Substance Use Topics   Alcohol  use: No   Drug use: No     Allergies   Penicillins, Sulfa antibiotics, Amitiza [lubiprostone], Aspirin , and Penicillin g   Review of Systems Review of Systems   Physical Exam Triage Vital Signs ED Triage Vitals  Encounter Vitals Group     BP 08/02/24 1220 111/69     Girls Systolic BP Percentile --      Girls Diastolic BP Percentile --      Boys Systolic BP Percentile --      Boys Diastolic BP  Percentile --      Pulse Rate 08/02/24 1220 60     Resp  08/02/24 1220 16     Temp 08/02/24 1220 98.3 F (36.8 C)     Temp Source 08/02/24 1220 Oral     SpO2 08/02/24 1220 94 %     Weight --      Height --      Head Circumference --      Peak Flow --      Pain Score 08/02/24 1217 0     Pain Loc --      Pain Education --      Exclude from Growth Chart --    No data found.  Updated Vital Signs BP 111/69 (BP Location: Left Arm)   Pulse 60   Temp 98.3 F (36.8 C) (Oral)   Resp 16   LMP  (LMP Unknown)   SpO2 94%   Visual Acuity Right Eye Distance:   Left Eye Distance:   Bilateral Distance:    Right Eye Near:   Left Eye Near:    Bilateral Near:     Physical Exam Constitutional:      Appearance: Normal appearance.  Eyes:     Extraocular Movements: Extraocular movements intact.  Cardiovascular:     Rate and Rhythm: Normal rate and regular rhythm.     Pulses: Normal pulses.     Heart sounds: Normal heart sounds.     Comments: Weeping to the bilateral lower extremities, serous fluid, 2+ dorsalis pedis pulses Pulmonary:     Effort: Pulmonary effort is normal.     Breath sounds: Normal breath sounds.  Musculoskeletal:     Right lower leg: 2+ Edema present.     Left lower leg: 2+ Edema present.  Neurological:     Mental Status: She is alert and oriented to person, place, and time. Mental status is at baseline.      UC Treatments / Results  Labs (all labs ordered are listed, but only abnormal results are displayed) Labs Reviewed - No data to display  EKG   Radiology No results found.  Procedures Procedures (including critical care time)  Medications Ordered in UC Medications - No data to display  Initial Impression / Assessment and Plan / UC Course  I have reviewed the triage vital signs and the nursing notes.  Pertinent labs & imaging results that were available during my care of the patient were reviewed by me and considered in my medical decision making (see chart for details).  Leg swelling   Vital  signs within normal ranges, patient in no signs of distress nontoxic-appearing, lungs clear to auscultation, stable for outpatient management, chest x-ray pending, if fluid is present and it severity is moderate to severe she will be sent to the nearest emergency department for further management, if mild fluid noted then diuretic will be increased from 40 mg to 60 mg daily for 7 days, wrapped with bandages for compression and advised additional elevation, discussed for any respiratory involvement she is to go to the nearest emergency department and patient to notify her primary doctor or cardiologist for further evaluation and follow-up Final Clinical Impressions(s) / UC Diagnoses   Final diagnoses:  Leg swelling     Discharge Instructions      Today you were evaluated for your leg swelling  \Due to the increase of swelling from baseline it is concerning that this is related to your heart failure  Chest x-ray has been completed and you  will be called with results and instructed on how to move forward  If there is a large amount of fluid within the lungs you will be sent to the emergency department as you will most likely need IV medications for management  If there is only a small amount of fluid then we will increase your diuretic  Would like you to elevate your legs whenever sitting and lying to help with swelling  Would like you to wear compression hosiery or wear wraps to help remove fluid  Please notify your heart doctor or your primary doctor on Tuesday of increased swelling for follow-up appointment   ED Prescriptions   None    PDMP not reviewed this encounter.   Teresa Shelba SAUNDERS, NP 08/02/24 1331

## 2024-08-03 ENCOUNTER — Telehealth: Payer: Self-pay | Admitting: Emergency Medicine

## 2024-08-03 MED ORDER — FUROSEMIDE 20 MG PO TABS: 20.0000 mg | ORAL_TABLET | Freq: Every day | ORAL | 0 refills | Status: DC

## 2024-08-03 NOTE — Telephone Encounter (Signed)
 Reported chest x-ray results to son via telephone, 2 patient identifiers used, patient verbalized during visit that it was okay to discuss treatment with family as she is hearing impaired, chest x-ray is negative and will increase Lasix  dose from 40 mg to 60 mg daily for 7 days, additional pill sent to pharmacy, compression was applied prior to discharge advised to use for a few hours throughout the day as well as elevate and to schedule follow-up appointment with the primary doctor for reevaluation of symptoms

## 2024-08-05 ENCOUNTER — Ambulatory Visit (HOSPITAL_COMMUNITY): Payer: Self-pay

## 2024-08-05 ENCOUNTER — Ambulatory Visit

## 2024-08-05 VITALS — BP 110/70 | HR 63 | Temp 98.0°F | Wt 175.2 lb

## 2024-08-05 DIAGNOSIS — I890 Lymphedema, not elsewhere classified: Secondary | ICD-10-CM

## 2024-08-05 DIAGNOSIS — L750 Bromhidrosis: Secondary | ICD-10-CM | POA: Diagnosis not present

## 2024-08-05 DIAGNOSIS — R350 Frequency of micturition: Secondary | ICD-10-CM | POA: Diagnosis not present

## 2024-08-05 DIAGNOSIS — I5032 Chronic diastolic (congestive) heart failure: Secondary | ICD-10-CM | POA: Diagnosis not present

## 2024-08-05 HISTORY — DX: Bromhidrosis: L75.0

## 2024-08-05 LAB — URINALYSIS, ROUTINE W REFLEX MICROSCOPIC
Bilirubin Urine: NEGATIVE
Hgb urine dipstick: NEGATIVE
Ketones, ur: NEGATIVE
Nitrite: NEGATIVE
RBC / HPF: NONE SEEN (ref 0–?)
Specific Gravity, Urine: 1.01 (ref 1.000–1.030)
Total Protein, Urine: NEGATIVE
Urine Glucose: NEGATIVE
Urobilinogen, UA: 1 (ref 0.0–1.0)
pH: 7.5 (ref 5.0–8.0)

## 2024-08-05 NOTE — Patient Instructions (Addendum)
-   Stop Mirtazapine  as this can cause lower leg swelling. You can take this every other day for one week then stop.  - I am checking urine and will update you/your son about the result.  - You have lymphedema, you need to wrap lower legs everyday during the day time. You need to follow up with vascular/vein doctor as you have not followed up with them since 2022. I put a new referral. They will call you schedule an appointment.  - You can go down to taking Lasix  40 mg daily instead of taking 60 mg daily.  - Follow up with Dr. Abbey in 2 months.

## 2024-08-05 NOTE — Assessment & Plan Note (Signed)
 Echo: 10/09/15 EF 55-60%grade 1 dd. On Lasix  40 mg, continue. Referral to cardiology made today for further evaluation including repeat echocardiogram in the context of worsening lower leg edema.

## 2024-08-05 NOTE — Assessment & Plan Note (Signed)
 After increasing dose of Furosemide  from 40 mg to 60 mg daily. Recommend reducing dose of Furosemide  from 60 mg to 40 mg daily as lower leg edema is improved.  Check UA, culture. Pending results for further management.

## 2024-08-05 NOTE — Assessment & Plan Note (Addendum)
 Currently has b/l lower legs in coban wrap. She continues to have lower leg edema, 3+ pitting b/l.  Skin negative for signs of infection so antibiotic not recommended.  Strongly recommend wearing compression stockings daily/wrapping both legs during day time.  Recommend walking as much as tolerated, leg elevation to help reduce swelling.  Recommend take Mirtazapine  every other day for one week then stop as worsening lower leg edema could be exacerbated by medication.  Reduce dose of Lasix  from 60 mg daily to 40 mg daily given improvement in edema, CKD.  She has not f/u with vascular surgery since 05/09/2021. Recommend reestablishing care with vascular surgery given h/o significant lymphedema, distance h/o DVT. Echo: 10/09/15 EF 55-60%grade 1 dd.  She will also need to f/u with cardiology to get echocardiogram to r/u CHF contributing to lower leg edema. Cardiology referral made.  Cardiology referral made.

## 2024-08-05 NOTE — Progress Notes (Signed)
 Acute Office Visit  Subjective:    Patient ID: Jessica Erickson, female    DOB: 11-12-1926, 89 y.o.   MRN: [id number]  Chief Complaint  Patient presents with   Leg Swelling   Patient is in today for following concern: HPI Patient accompanied by her caregiver, history obtained by writing questions, recommendations on paper as patient is hard of hearing.   Worsening b/l lower leg edema for about 6 days. She has a h/o lymphedema and has not been wearing compression stockings as recommended by vascular surgery in the past. Was seen at [facility] on 08/02/24 for this. Chest x-ray was reassuring, no CHF. Was recommended to increase Lasix  from 40 mg to 60 mg daily. Patient reports swelling has been improving. She has been wrapping both lower legs. Has noticed improvement in discharge. No fever, chills, pruritus, chest pain, shortness of breath, nausea, vomiting, abdominal pain.   She had one episode of urinary odor last week. Has noted increased urinary frequency since increasing Lasix  from 40 mg to 60 mg otherwise no other urinary concerns. Urine odor is resolved.   Pertinent positive: Vascular surgery: Last OV on 05/09/2021 with Dr. Cordella Shawl for lower leg edema. Has a remote h/o DVT, last duplex ultrasound from 11/10/2019 no DVT. Recommended symptomatic management of lymphedema with compression stockings class 1 (20-30 mmHg), lower leg elevation, routine tolerable exercise including walking.   Patient has not been wearing compression stocking regularly. During her visit with me on 07/02/24 Mirtazapine  dose adjustment done from 15 mg to 30 mg daily due to concern for loss of appetite.   ROS As per HPI    Objective:    BP 110/70 (BP Location: Right Arm, Patient Position: Sitting, Cuff Size: Normal)   Pulse 63   Temp 98 F (36.7 C) (Oral)   Wt 175 lb 3.2 oz (79.5 kg)   LMP  (LMP Unknown)   SpO2 91%   BMI 27.44 kg/m    Physical Exam HENT:     Head: Normocephalic and atraumatic.      Mouth/Throat:     Mouth: Mucous membranes are moist.  Cardiovascular:     Rate and Rhythm: Normal rate.  Pulmonary:     Effort: Pulmonary effort is normal.     Breath sounds: No wheezing.  Abdominal:     General: Bowel sounds are normal. There is no distension.     Palpations: Abdomen is soft.     Tenderness: There is no abdominal tenderness. There is no guarding.  Musculoskeletal:     Right lower leg: Edema (3+ pitting edema bilaterally, no asctive uncerations. Skin is normal to touch.) present.     Left lower leg: Edema present.  Neurological:     Mental Status: She is alert and oriented to person, place, and time.     Comments: Slow gait.  Patient uses a walker for ambulation.  She is hard of hearing and relies on written communication completely. Can't do sign language.      No results found for any visits on 08/05/24.     Assessment & Plan:  Patient is a pleasant 89 year old female presenting for UC follow up for lower leg edema. After her visit with me I called her primary caregiver/son on the phone to discuss following recommendations:   Lymphedema Assessment & Plan: Currently has b/l lower legs in coban wrap. She continues to have lower leg edema, 3+ pitting b/l.  Skin negative for signs of infection so antibiotic not recommended.  Strongly recommend wearing compression stockings daily/wrapping both legs during day time.  Recommend walking as much as tolerated, leg elevation to help reduce swelling.  Recommend take Mirtazapine  every other day for one week then stop as worsening lower leg edema could be exacerbated by medication.  Reduce dose of Lasix  from 60 mg daily to 40 mg daily given improvement in edema, CKD.  She has not f/u with vascular surgery since 05/09/2021. Recommend reestablishing care with vascular surgery given h/o significant lymphedema, distance h/o DVT. Echo: 10/09/15 EF 55-60%grade 1 dd.  She will also need to f/u with cardiology to get echocardiogram to  r/u CHF contributing to lower leg edema. Cardiology referral made.  Cardiology referral made.   Orders: -     Ambulatory referral to Vascular Surgery  Urinary body odor Assessment & Plan: One episode, last week, resolved.  Check UA, urine culture.  Management pending results.   Orders: -     Urinalysis, Routine w reflex microscopic -     Urine Culture  Urination frequency Assessment & Plan: After increasing dose of Furosemide  from 40 mg to 60 mg daily. Recommend reducing dose of Furosemide  from 60 mg to 40 mg daily as lower leg edema is improved.  Check UA, culture. Pending results for further management.    Chronic heart failure with preserved ejection fraction (HFpEF, >= 50%) (HCC) Assessment & Plan: Echo: 10/09/15 EF 55-60%grade 1 dd. On Lasix  40 mg, continue. Referral to cardiology made today for further evaluation including repeat echocardiogram in the context of worsening lower leg edema.   Orders: -     Ambulatory referral to Cardiology   I personally spent a total of 60 minutes in the care of the patient today including getting/reviewing separately obtained history, performing a medically appropriate exam/evaluation, counseling and educating, placing orders, referring and communicating with other health care professionals, documenting clinical information in the EHR, communicating results, and coordinating care.  Return in about 8 weeks (around 09/30/2024) for Chronic, needa 1 hour appointment for follow up (always).  Luke Shade, MD

## 2024-08-05 NOTE — Assessment & Plan Note (Signed)
 One episode, last week, resolved.  Check UA, urine culture.  Management pending results.

## 2024-08-05 NOTE — Telephone Encounter (Signed)
 Noted

## 2024-08-06 LAB — URINE CULTURE
MICRO NUMBER:: 16910227
Result:: NO GROWTH
SPECIMEN QUALITY:: ADEQUATE

## 2024-08-07 ENCOUNTER — Ambulatory Visit: Payer: Self-pay

## 2024-08-07 NOTE — Progress Notes (Signed)
 Please let patient's son know Jessica Erickson's urine culture came back negative for bacterial growth suggestive of no urinary tract infection. No antibiotic is recommended.   Luke Shade, MD

## 2024-08-12 ENCOUNTER — Encounter (INDEPENDENT_AMBULATORY_CARE_PROVIDER_SITE_OTHER): Payer: Self-pay | Admitting: Vascular Surgery

## 2024-08-12 ENCOUNTER — Ambulatory Visit (INDEPENDENT_AMBULATORY_CARE_PROVIDER_SITE_OTHER): Admitting: Vascular Surgery

## 2024-08-12 VITALS — BP 117/66 | HR 66 | Resp 18

## 2024-08-12 DIAGNOSIS — I890 Lymphedema, not elsewhere classified: Secondary | ICD-10-CM

## 2024-08-12 DIAGNOSIS — I10 Essential (primary) hypertension: Secondary | ICD-10-CM | POA: Diagnosis not present

## 2024-08-12 DIAGNOSIS — K219 Gastro-esophageal reflux disease without esophagitis: Secondary | ICD-10-CM | POA: Diagnosis not present

## 2024-08-13 ENCOUNTER — Encounter (INDEPENDENT_AMBULATORY_CARE_PROVIDER_SITE_OTHER): Payer: Self-pay | Admitting: Vascular Surgery

## 2024-08-13 NOTE — Progress Notes (Signed)
 Subjective:    Patient ID: Jessica Erickson, female    DOB: 02-08-1926, 89 y.o.   MRN: [id number] Chief Complaint  Patient presents with   New Patient (Initial Visit)    Ref Bair consult lymphedema    Jessica Erickson is a 89 yo female who is deaf and who presents to clinic today with chief complaint of bilateral lower extremity swelling/lymphedema.  Patient has a family member at the visit with her today.  In order to communicate with [name] you must write on a board and she reads it as she does not and cannot interpret sign language.  Patient endorses that her bilateral lower extremity swelling is not painful just uncomfortable.  Patient states she has had this swelling for years.  Currently she presents to clinic today with bilateral lower extremities wrapped with Ace bandage.  Unfortunately these are not wrapped very tightly and therefore she presents with +2 edema/swelling to her bilateral lower extremities including feet and toes.  She endorses she does not like the socks because they hurt her as they are too tight around her calves.  She tries to wrap her legs on a daily basis but is unsuccessful due to her mobility.  At 89 years old she still continues to live somewhat independently with her son.  She currently takes Lasix  for heart failure with preserved EF which is helping control some of her swelling.  She continues to try to be active and ambulate with a walker but her family member admits it is not much walking these days.     Review of Systems  Constitutional: Negative.   HENT:         Patient is deaf  Cardiovascular:  Positive for leg swelling.  Musculoskeletal:  Positive for myalgias.  All other systems reviewed and are negative.      Objective:   Physical Exam Constitutional:      Appearance: Normal appearance. She is obese.  HENT:     Head: Normocephalic and atraumatic.     Ears:     Comments: Longstanding history of being hard of hearing which has progressed to  deafness.  Patient communicates by writing board only Eyes:     Pupils: Pupils are equal, round, and reactive to light.  Cardiovascular:     Rate and Rhythm: Normal rate and regular rhythm.     Pulses: Normal pulses.     Heart sounds: Normal heart sounds.  Pulmonary:     Effort: Pulmonary effort is normal.     Breath sounds: Normal breath sounds.  Abdominal:     General: Bowel sounds are normal.     Palpations: Abdomen is soft.  Musculoskeletal:        General: Swelling present.     Right lower leg: Edema present.     Left lower leg: Edema present.  Skin:    General: Skin is warm and dry.     Capillary Refill: Capillary refill takes 2 to 3 seconds.  Neurological:     General: No focal deficit present.     Mental Status: She is alert and oriented to person, place, and time. Mental status is at baseline.  Psychiatric:        Mood and Affect: Mood normal.        Behavior: Behavior normal.        Thought Content: Thought content normal.        Judgment: Judgment normal.     BP 117/66   Pulse 66  Resp 18   LMP  (LMP Unknown)   Past Medical History:  Diagnosis Date   Acoustic neuroma (HCC)    Allergy    Asthma    Bilateral swelling of feet    and legs   Bladder infection    CAD (coronary artery disease)    Cataract    Change in voice    Compression fracture of body of thoracic vertebra (HCC)    T12 09/18/15 MRI s/p fall    Constipation    COPD (chronic obstructive pulmonary disease) (HCC)    previous CXR with chronic interstitial lung dz    CVA (cerebral vascular accident) (HCC)    Depression    Diabetes (HCC)    with neuropathy   Diabetes mellitus, type 2 (HCC)    Diarrhea    Double vision    DVT (deep venous thrombosis) (HCC)    right leg 10/2015 was on coumadin  off as of 2017/2018 ; s/p IVC filter   Enuresis    Eye pain, right    Fall    Fatty liver    09/15/15 also mildly dilated pancreatitic duct rec MRCP small sub cm cyst hemangioma speeln mild right  hydronephrorossi and prox. hydroureter, kidney stones, mild scarring kidneys   Female stress incontinence    Flank pain    GERD (gastroesophageal reflux disease)    with small hiatal hernia    Hard of hearing    Heart disease    History of kidney problems    Hyperlipidemia    mixed   Hypertension    Hypothyroidism, postsurgical    Impaired mobility and ADLs    uses rolling walker has caretaker 24/7 at home   Leg edema    Mixed incontinence urge and stress (female)(female)    Neuropathy    Osteoarthritis    DDD spine    Osteoporosis with fracture    T12 compression fracture   Photophobia    Pulmonary embolism (HCC)    10/2015 off coumadin  as of 04/2016   Pulmonary HTN (HCC)    mild pulm HTN, echo 10/09/15 EF 55-60%grade 1 dd, RV systolic pressure increased    Recurrent UTI    Sinus pressure    Skin cancer    BCC jawline and scalp    Thyroid  disease    follows [facility]   TIA (transient ischemic attack)    MRI 2009/2010 neg stroke    Trigeminal neuralgia    Dr. Nancye Meeter s/p gamma knife x 2, on Tegretol  since 2011/2012 no increase in dose >200 mg bid rec per family per neurology    Urinary frequency    Urinary, incontinence, stress female    Dr Penne urology     Social History   Socioeconomic History   Marital status: Widowed    Spouse name: Not on file   Number of children: 5   Years of education: Not on file   Highest education level: Some college, no degree  Occupational History   Occupation: retired  Tobacco Use   Smoking status: Former    Current packs/day: 0.00    Average packs/day: 0.5 packs/day for 20.0 years (10.0 ttl pk-yrs)    Types: Cigarettes    Start date: 09/20/1975    Quit date: 09/20/1995    Years since quitting: 28.9   Smokeless tobacco: Never   Tobacco comments:    quit 1996 smoked 20 years max 8 cig qd   Vaping Use   Vaping status: Never Used  Substance  and Sexual Activity   Alcohol  use: No   Drug use: No   Sexual activity: Not  on file  Other Topics Concern   Not on file  Social History Narrative   Lives at home with Jessica Erickson son and caretakers come to house    Has children    Likes to ride stationary bike and sew    [name] lives with as of 2022 and he is POA youngest son            Social Drivers of Corporate investment banker Strain: Low Risk  (08/05/2024)   Overall Financial Resource Strain (CARDIA)    Difficulty of Paying Living Expenses: Not hard at all  Food Insecurity: No Food Insecurity (08/05/2024)   Hunger Vital Sign    Worried About Running Out of Food in the Last Year: Never true    Ran Out of Food in the Last Year: Never true  Transportation Needs: No Transportation Needs (08/05/2024)   PRAPARE - Administrator, Civil Service (Medical): No    Lack of Transportation (Non-Medical): No  Physical Activity: Inactive (08/05/2024)   Exercise Vital Sign    Days of Exercise per Week: 0 days    Minutes of Exercise per Session: Not on file  Stress: No Stress Concern Present (08/05/2024)   [employer] of Occupational Health - Occupational Stress Questionnaire    Feeling of Stress: Not at all  Social Connections: Socially Isolated (08/05/2024)   Social Connection and Isolation Panel    Frequency of Communication with Friends and Family: More than three times a week    Frequency of Social Gatherings with Friends and Family: More than three times a week    Attends Religious Services: Patient declined    Database administrator or Organizations: No    Attends Banker Meetings: Not on file    Marital Status: Widowed  Intimate Partner Violence: Not At Risk (01/21/2024)   Humiliation, Afraid, Rape, and Kick questionnaire    Fear of Current or Ex-Partner: No    Emotionally Abused: No    Physically Abused: No    Sexually Abused: No    Past Surgical History:  Procedure Laterality Date   APPENDECTOMY     as a child, open   BRAIN SURGERY     schwnnoma removal 1996    brain tumor surgery      BREAST SURGERY     breast bx   CATARACT EXTRACTION     CHOLECYSTECTOMY     EYE SURGERY     cataract   IVC FILTER PLACEMENT ([facility] HX)     Dr. Marea 10/2015    LAPAROSCOPIC TUBAL LIGATION     MOHS SURGERY     scalp 04/2014    PERIPHERAL VASCULAR CATHETERIZATION N/A 10/11/2015   Procedure: IVC Filter Insertion;  Surgeon: Selinda GORMAN Marea, MD;  Location: [facility] INVASIVE [facility];  Service: Cardiovascular;  Laterality: N/A;   PERIPHERAL VASCULAR THROMBECTOMY Bilateral 03/29/2018   Procedure: PERIPHERAL VASCULAR THROMBECTOMY;  Surgeon: Marea Selinda GORMAN, MD;  Location: [facility];  Service: Cardiovascular;  Laterality: Bilateral;   PUBOVAGINAL SLING     THROAT SURGERY     THYROID  SURGERY     tumor around vocal cords    TOOTH EXTRACTION     winter 2018    TOTAL THYROIDECTOMY  1976    Family History  Problem Relation Age of Onset   Heart disease Mother    Diabetes Father  Cancer Daughter        breast ca x 2 s/p mastectomy     Allergies  Allergen Reactions   Penicillins Shortness Of Breath, Rash and Other (See Comments)    Has patient had a PCN reaction causing immediate rash, facial/tongue/throat swelling, SOB or lightheadedness with hypotension: Yes Has patient had a PCN reaction causing severe rash involving mucus membranes or skin necrosis: No Has patient had a PCN reaction that required hospitalization No Has patient had a PCN reaction occurring within the last 10 years: No If all of the above answers are NO, then may proceed with Cephalosporin use.   Sulfa Antibiotics Shortness Of Breath, Rash and Other (See Comments)   Amitiza [Lubiprostone]     N/v/d   Aspirin  Other (See Comments)    Reaction:  Unknown  Other reaction(s): Bleeding (intolerance) Per patient  causes nose to bleed Can take 81 mg daily without any complications Other reaction(s): bloody nose    Penicillin G Other (See Comments)    Has patient had a PCN reaction causing immediate rash,  facial/tongue/throat swelling, SOB or lightheadedness with hypotension: No Has patient had a PCN reaction causing severe rash involving mucus membranes or skin necrosis: Unknown Has patient had a PCN reaction that required hospitalization: Unknown Has patient had a PCN reaction occurring within the last 10 years: Unknown If all of the above answers are NO, then may proceed with Cephalosporin use.       Latest Ref Rng & Units 05/27/2024   10:46 AM 02/06/2024    3:12 PM 02/05/2024    3:28 PM  CBC  WBC 4.0 - 10.5 K/uL 5.1  6.6  6.8   Hemoglobin 12.0 - 15.0 g/dL 88.3  88.0  88.6   Hematocrit 36.0 - 46.0 % 37.3  36.2  36.0   Platelets 150 - 400 K/uL 161  188.0  191       CMP     Component Value Date/Time   NA 145 07/02/2024 1547   NA 143 06/09/2021 0907   K 4.1 07/02/2024 1547   CL 99 07/02/2024 1547   CO2 40 (H) 07/02/2024 1547   GLUCOSE 102 (H) 07/02/2024 1547   BUN 16 07/02/2024 1547   BUN 19 06/09/2021 0907   CREATININE 1.35 (H) 07/02/2024 1547   CREATININE 1.38 (H) 02/05/2024 1528   CALCIUM  8.9 07/02/2024 1547   PROT 6.2 07/02/2024 1547   PROT 6.2 06/09/2021 0907   ALBUMIN 3.7 07/02/2024 1547   ALBUMIN 3.7 06/09/2021 0907   AST 19 07/02/2024 1547   ALT 9 07/02/2024 1547   ALKPHOS 56 07/02/2024 1547   BILITOT 0.4 07/02/2024 1547   BILITOT 0.3 06/09/2021 0907   GFR 33.24 (L) 07/02/2024 1547   EGFR 35 (L) 02/05/2024 1528   EGFR 32 (L) 06/09/2021 0907   GFRNONAA 41 (L) 05/27/2024 1046     No results found.     Assessment & Plan:   1. Lymphedema (Primary)  Patient's bilateral lower extremities today were wrapped with Kerlix and Coban as a pseudo Unna boot for her.  This was to replace the old worn-out Ace bandages she was using and showed up to clinic wearing today.  I informed the patient as well as the family member with her today that she can wear the pseudo boots until Thursday when she showers.  She was instructed to continue with new Ace wrap's to her lower  extremities until she could acquire compression socks/stockings.  Both verbalized their understanding.  Recommend:  I have had a long discussion with the patient regarding swelling and why it  causes symptoms.  Patient will begin wearing graduated compression on a daily basis a prescription was given. The patient will  wear the stockings first thing in the morning and removing them in the evening. The patient is instructed specifically not to sleep in the stockings.   In addition, behavioral modification will be initiated.  This will include frequent elevation, use of over the counter pain medications and exercise such as walking.  Consideration for a lymph pump will also be made based upon the effectiveness of conservative therapy.  This would help to improve the edema control and prevent sequela such as ulcers and infections   The patient will follow-up as needed.  2. Primary hypertension Continue antihypertensive medications as already ordered, these medications have been reviewed and there are no changes at this time.  3. GERD without esophagitis Continue PPI as already ordered, this medication has been reviewed and there are no changes at this time.  Avoidence of caffeine and alcohol   Moderate elevation of the head of the bed    Current Outpatient Medications on File Prior to Visit  Medication Sig Dispense Refill   ACCU-CHEK GUIDE TEST test strip USE TO TEST BLOOD SUGAR TWICE DAILY 200 each 1   Accu-Chek Softclix Lancets lancets Use as instructed 100 each 12   acetaminophen  (TYLENOL ) 325 MG tablet Take 650 mg by mouth 2 (two) times daily as needed for mild pain (pain score 1-3).     apixaban  (ELIQUIS ) 2.5 MG TABS tablet Take 1 tablet (2.5 mg total) by mouth 2 (two) times daily. 180 tablet 3   arformoterol  (BROVANA ) 15 MCG/2ML NEBU Take 2 mLs (15 mcg total) by nebulization 2 (two) times daily. 360 mL 6   atorvastatin  (LIPITOR) 40 MG tablet Take 1 tablet (40 mg total) by mouth daily.  90 tablet 3   bisacodyl  (DULCOLAX) 5 MG EC tablet Take 5 mg by mouth daily as needed for moderate constipation.     Blood Glucose Monitoring Suppl (ACCU-CHEK AVIVA PLUS) w/Device KIT Use as directed 1 kit 0   budesonide  (PULMICORT ) 0.25 MG/2ML nebulizer solution Take 2 mLs (0.25 mg total) by nebulization 2 (two) times daily. 360 mL 3   calcium -vitamin D  (OSCAL WITH D) 500-5 MG-MCG tablet Take 1 tablet by mouth 2 (two) times daily. Unable to find former dose 600ca/400 vitamin D  inform patient dose change Oscal 180 tablet 3   cetirizine  (ZYRTEC ) 10 MG tablet TAKE 1 TABLET BY MOUTH DAILY. 90 tablet 3   docusate sodium  (COLACE) 100 MG capsule Take 100 mg by mouth 2 (two) times daily.     feeding supplement, GLUCERNA SHAKE, (GLUCERNA SHAKE) LIQD Take 237 mLs by mouth 2 (two) times daily between meals.  0   furosemide  (LASIX ) 20 MG tablet Take 1 tablet (20 mg total) by mouth daily. 7 tablet 0   furosemide  (LASIX ) 40 MG tablet Take 1 tablet (40 mg total) by mouth daily. 120 tablet 3   gabapentin  (NEURONTIN ) 100 MG capsule TAKE 2 CAPSULES BY MOUTH 3 TIMES DAILY. 504 capsule 3   guaiFENesin  (MUCINEX ) 600 MG 12 hr tablet Take 2 tablets (1,200 mg total) by mouth 2 (two) times daily as needed for cough or to loosen phlegm. (Patient taking differently: Take 600 mg by mouth 2 (two) times daily as needed for cough or to loosen phlegm.)     ipratropium-albuterol  (DUONEB) 0.5-2.5 (3) MG/3ML SOLN TAKE 3 ML  BY NEBULIZATION EVERY 4 HOURS AS NEEDED 360 mL 11   levothyroxine  (SYNTHROID ) 150 MCG tablet Take 1 tablet (150 mcg total) by mouth daily before breakfast. 90 tablet 1   lidocaine  (XYLOCAINE ) 5 % ointment Apply 1 Application topically 2 (two) times daily as needed. 35.44 g 0   Melatonin 10 MG TABS Take 10 mg by mouth at bedtime. 90 tablet 3   Multiple Vitamin (MULTIVITAMIN WITH MINERALS) TABS tablet Take 1 tablet by mouth daily.      OXYGEN  Inhale 2 L/min into the lungs continuous.     pantoprazole  (PROTONIX ) 40  MG tablet Take 1 tablet (40 mg total) by mouth daily. 90 tablet 3   polyethylene glycol powder (GLYCOLAX /MIRALAX ) 17 GM/SCOOP powder Take 17 g by mouth daily as needed. 1530 g 1   polyvinyl alcohol  (LIQUIFILM TEARS) 1.4 % ophthalmic solution Place 1 drop into both eyes at bedtime. 15 mL 0   revefenacin  (YUPELRI ) 175 MCG/3ML nebulizer solution Take 3 mLs (175 mcg total) by nebulization daily. 84 mL 0   revefenacin  (YUPELRI ) 175 MCG/3ML nebulizer solution Take 3 mLs (175 mcg total) by nebulization daily. 270 mL 3   No current facility-administered medications on file prior to visit.    There are no Patient Instructions on file for this visit. No follow-ups on file.   Gwendlyn JONELLE Shank, NP

## 2024-08-19 DIAGNOSIS — G4700 Insomnia, unspecified: Secondary | ICD-10-CM

## 2024-08-19 MED ORDER — MIRTAZAPINE 7.5 MG PO TABS: 7.5000 mg | ORAL_TABLET | Freq: Every day | ORAL | 3 refills | Status: DC

## 2024-08-21 ENCOUNTER — Ambulatory Visit: Admitting: Occupational Therapy

## 2024-08-25 ENCOUNTER — Ambulatory Visit: Admitting: Occupational Therapy

## 2024-08-27 ENCOUNTER — Other Ambulatory Visit (HOSPITAL_COMMUNITY): Payer: Self-pay

## 2024-08-27 ENCOUNTER — Encounter: Payer: Self-pay | Admitting: Oncology

## 2024-08-27 ENCOUNTER — Telehealth: Payer: Self-pay

## 2024-08-27 NOTE — Telephone Encounter (Signed)
 Pharmacy Patient Advocate Encounter   Received notification from CoverMyMeds that prior authorization for FUROSEMIDE  40MG  is required/requested.   Insurance verification completed.   The patient is insured through [employer] .   Per test claim: Refill too soon. PA is not needed at this time. Medication was filled 08/26/24. Next eligible fill date is 11/09/24.

## 2024-09-02 ENCOUNTER — Other Ambulatory Visit (HOSPITAL_COMMUNITY): Payer: Self-pay

## 2024-09-10 ENCOUNTER — Ambulatory Visit: Admitting: Occupational Therapy

## 2024-09-29 ENCOUNTER — Ambulatory Visit: Admitting: Occupational Therapy

## 2024-09-29 DIAGNOSIS — I890 Lymphedema, not elsewhere classified: Secondary | ICD-10-CM | POA: Insufficient documentation

## 2024-09-29 DIAGNOSIS — R600 Localized edema: Secondary | ICD-10-CM | POA: Diagnosis not present

## 2024-09-30 ENCOUNTER — Encounter: Payer: Self-pay | Admitting: Occupational Therapy

## 2024-09-30 NOTE — Therapy (Unsigned)
 OUTPATIENT OCCUPATIONAL THERAPY  EVALUATION  BILATERAL LOWER EXTREMITY LYMPHEDEMA  Patient Name: Jessica Erickson MRN: [id number] DOB:1926-05-25, 89 y.o., female Today's Date: 09/30/2024  END OF SESSION:  OT End of Session - 09/30/24 1441     Visit Number 1    Number of Visits 6    Date for Recertification  12/29/24    OT Start Time 0315    OT Stop Time 0415    OT Time Calculation (min) 60 min    Activity Tolerance Patient tolerated treatment well;No increased pain;Treatment limited secondary to medical complications (Comment)   deafness   Behavior During Therapy [facility] for tasks assessed/performed            Past Medical History:  Diagnosis Date   Acoustic neuroma (HCC)    Allergy    Asthma    Bilateral swelling of feet    and legs   Bladder infection    CAD (coronary artery disease)    Cataract    Change in voice    Compression fracture of body of thoracic vertebra (HCC)    T12 09/18/15 MRI s/p fall    Constipation    COPD (chronic obstructive pulmonary disease) (HCC)    previous CXR with chronic interstitial lung dz    CVA (cerebral vascular accident) (HCC)    Depression    Diabetes (HCC)    with neuropathy   Diabetes mellitus, type 2 (HCC)    Diarrhea    Double vision    DVT (deep venous thrombosis) (HCC)    right leg 10/2015 was on coumadin  off as of 2017/2018 ; s/p IVC filter   Enuresis    Eye pain, right    Fall    Fatty liver    09/15/15 also mildly dilated pancreatitic duct rec MRCP small sub cm cyst hemangioma speeln mild right hydronephrorossi and prox. hydroureter, kidney stones, mild scarring kidneys   Female stress incontinence    Flank pain    GERD (gastroesophageal reflux disease)    with small hiatal hernia    Hard of hearing    Heart disease    History of kidney problems    Hyperlipidemia    mixed   Hypertension    Hypothyroidism, postsurgical    Impaired mobility and ADLs    uses rolling walker has caretaker 24/7 at home   Leg edema     Mixed incontinence urge and stress (female)(female)    Neuropathy    Osteoarthritis    DDD spine    Osteoporosis with fracture    T12 compression fracture   Photophobia    Pulmonary embolism (HCC)    10/2015 off coumadin  as of 04/2016   Pulmonary HTN (HCC)    mild pulm HTN, echo 10/09/15 EF 55-60%grade 1 dd, RV systolic pressure increased    Recurrent UTI    Sinus pressure    Skin cancer    BCC jawline and scalp    Thyroid  disease    follows [facility]   TIA (transient ischemic attack)    MRI 2009/2010 neg stroke    Trigeminal neuralgia    Dr. Nancye Meeter s/p gamma knife x 2, on Tegretol  since 2011/2012 no increase in dose >200 mg bid rec per family per neurology    Urinary frequency    Urinary, incontinence, stress female    Dr Penne urology    Past Surgical History:  Procedure Laterality Date   APPENDECTOMY     as a child, open   BRAIN SURGERY  schwnnoma removal 1996    brain tumor surgery     BREAST SURGERY     breast bx   CATARACT EXTRACTION     CHOLECYSTECTOMY     EYE SURGERY     cataract   IVC FILTER PLACEMENT ([facility] HX)     Dr. Marea 10/2015    LAPAROSCOPIC TUBAL LIGATION     MOHS SURGERY     scalp 04/2014    PERIPHERAL VASCULAR CATHETERIZATION N/A 10/11/2015   Procedure: IVC Filter Insertion;  Surgeon: Selinda GORMAN Marea, MD;  Location: [facility] INVASIVE CV LAB;  Service: Cardiovascular;  Laterality: N/A;   PERIPHERAL VASCULAR THROMBECTOMY Bilateral 03/29/2018   Procedure: PERIPHERAL VASCULAR THROMBECTOMY;  Surgeon: Marea Selinda GORMAN, MD;  Location: [facility] INVASIVE CV LAB;  Service: Cardiovascular;  Laterality: Bilateral;   PUBOVAGINAL SLING     THROAT SURGERY     THYROID  SURGERY     tumor around vocal cords    TOOTH EXTRACTION     winter 2018    TOTAL THYROIDECTOMY  1976   Patient Active Problem List   Diagnosis Date Noted   Urinary body odor 08/05/2024   Atrophic endometrium 02/20/2024   Blood thinned due to long-term anticoagulant use 02/20/2024   Hematuria  02/11/2024   Abnormal vaginal bleeding in postmenopausal patient 02/06/2024   MRSA pneumonia (HCC) 10/07/2023   Acute exacerbation of bronchiectasis (HCC) 09/26/2023   Urination frequency 08/19/2023   Vaso vagal episode 04/02/2023   Acute respiratory failure with hypoxia and hypercapnia (HCC) 11/06/2022   Chronic heart failure with preserved ejection fraction (HFpEF, >= 50%) (HCC) 11/06/2022   FTT (failure to thrive) in adult 10/25/2022   Dyslipidemia 10/24/2022   Hypothyroidism 10/24/2022   GERD without esophagitis 10/24/2022   Hearing loss 09/01/2022   DNR (do not resuscitate) 08/01/2022   Abnormal gait 10/19/2021   At high risk for falls 10/14/2021   Spinal stenosis of lumbar region 09/30/2021   Pain in both lower extremities 09/30/2021   Osteoarthritis 09/30/2021   Lumbar and sacral arthritis 09/30/2021   Pure hypercholesterolemia, unspecified 09/15/2021   Coronary atherosclerosis 09/15/2021   Hearing decreased, bilateral 08/22/2021   Chronic respiratory failure with hypoxia and hypercapnia (HCC) 08/14/2021   CKD (chronic kidney disease), stage III (HCC) 08/13/2021   History of CVA (cerebrovascular accident) 08/13/2021   Prediabetes 06/29/2021   Pulmonary artery hypertension (HCC) 05/03/2021   Grade I diastolic dysfunction 08/16/2020   Obesity (BMI 30-39.9) 08/16/2020   Chronic kidney disease, stage 3b (HCC) 04/06/2020   Thrombocytopenia 04/06/2020   Seborrheic keratoses 04/06/2020   Hip pain 09/30/2019   History of deep vein thrombosis (DVT) of lower extremity 09/30/2019   Arthritis 09/30/2019   Fall 03/11/2019   History of DVT (deep vein thrombosis) 11/12/2018   Bilateral leg edema 06/04/2018   Hearing loss of both ears 06/04/2018   Actinic keratosis 06/04/2018   Bronchiectasis (HCC) 06/04/2018   Depression 05/17/2018   Dehydration 05/15/2018   Hypertension 05/03/2018   Lymphedema 05/02/2018   Iron deficiency anemia 05/02/2018   Presence of IVC filter 05/02/2018    Anemia 04/11/2018   Fatigue 04/11/2018   Moderate protein-calorie malnutrition 03/22/2018   Orthostasis 03/05/2018   Dizziness 03/04/2018   COPD (chronic obstructive pulmonary disease) (HCC) 12/13/2017   Anxiety and depression 12/13/2017   Trigeminal neuralgia 12/13/2017   Constipation 12/13/2017   Insomnia 12/13/2017   Low back pain 10/08/2015   Collapsed vertebra, not elsewhere classified, thoracic region, initial encounter for fracture (HCC) 09/20/2015   Basal cell  carcinoma of scalp 04/06/2014   Fothergill's neuralgia 08/05/2013   Bladder infection, chronic 02/11/2013   Female genuine stress incontinence 02/11/2013   Incomplete bladder emptying 02/11/2013   Intrinsic sphincter deficiency 02/11/2013   Mixed incontinence 02/11/2013   Bladder retention 02/11/2013   FOM (frequency of micturition) 02/11/2013   Basal cell carcinoma of face 09/13/2011    PCP: Luke Shade, MD  REFERRING PROVIDER: same  REFERRING DIAG: I87.0  THERAPY DIAG:  Lymphedema, not elsewhere classified  Rationale for Evaluation and Treatment: Rehabilitation  ONSET DATE: chronic, decades  SUBJECTIVE:                                                                                                                                                                                           SUBJECTIVE STATEMENT:Saysha C Ohmer is referred by Luke Shade, MD, for evaluation and treatment of BLE lymphedema.Pt is accompanied by Jessica Erickson, one of several paid caregivers,  who explains Pt uses a white board to communicate since she has been deaf for about 4 years. Pt presents with single layer, worn out ace wraps applied starting at mid calf and wrapped to below the knee above crew length, mismatched socks that are slightly too small. The socks are not compression socks. Jessica Erickson states, She never wears socks. I've never seen her wear socks before.  PERTINENT HISTORY:   PAIN:  Are you having pain?  {OPRCPAIN:27236}  PRECAUTIONS: {Therapy precautions:24002}  RED FLAGS: {PT Red Flags:29287}   WEIGHT BEARING RESTRICTIONS: {Yes ***/No:24003}  FALLS:  Has patient fallen in last 6 months? {fallsyesno:27318}  LIVING ENVIRONMENT: Lives with: {OPRC lives with:25569::lives with their family} Lives in: {Lives in:25570} Stairs: {yes/no:20286}; {Stairs:24000} Has following equipment at home: {Assistive devices:23999}  OCCUPATION: ***  LEISURE: ***  HAND DOMINANCE: {RIGHT/LEFT:21944}   PRIOR LEVEL OF FUNCTION: {PLOF:24004}  PATIENT GOALS: ***   OBJECTIVE: Note: Objective measures were completed at Evaluation unless otherwise noted.  COGNITION:  Overall cognitive status: {cognition:24006}   PALPATION: ***  OBSERVATIONS / OTHER ASSESSMENTS: ***  SENSATION: {sensation:27233}  POSTURE: ***  LE ROM:   {AROM/PROM:27142}  Right 09/30/2024 LEFT 09/30/2024  Hip flexion    Hip extension    Hip abduction    Hip adduction    Hip internal rotation    Hip external rotation    Knee flexion    Knee extension    Ankle dorsiflexion    Ankle plantarflexion    Ankle inversion    Ankle eversion     (Blank rows = not tested)  LE MMT:   MMT Right 09/30/2024 Left 09/30/2024  Hip flexion    Hip extension  Hip abduction    Hip adduction    Hip internal rotation    Hip external rotation    Knee flexion    Knee extension    Ankle dorsiflexion    Ankle plantarflexion    Ankle inversion    Ankle eversion     (Blank rows = not tested)  LYMPHEDEMA ASSESSMENTS:   SURGERY TYPE/DATE: ***  NUMBER OF LYMPH NODES REMOVED: ***  CHEMOTHERAPY: ***  RADIATION:***  HORMONE TREATMENT: ***  INFECTIONS: ***   LYMPHEDEMA ASSESSMENTS:   LANDMARK RIGHT  09/30/2024  10 cm proximal to olecranon process   Olecranon process   10 cm proximal to ulnar styloid process   Just proximal to ulnar styloid process   Across hand at thumb web space   At base of 2nd digit    (Blank rows = not tested)  LANDMARK LEFT  09/30/2024  10 cm proximal to olecranon process   Olecranon process   10 cm proximal to ulnar styloid process   Just proximal to ulnar styloid process   Across hand at thumb web space   At base of 2nd digit   (Blank rows = not tested)   LE LANDMARK RIGHT 09/30/2024  At groin   30 cm proximal to suprapatella   20 cm proximal to suprapatella   10 cm proximal to suprapatella   At midpatella / popliteal crease   30 cm proximal to floor at lateral plantar foot   20 cm proximal to floor at lateral plantar foot   10 cm proximal to floor at lateral plantar foot   Circumference of ankle/heel   5 cm proximal to 1st MTP joint   Across MTP joint   Around proximal great toe   (Blank rows = not tested)  LE LANDMARK LEFT 09/30/2024  At groin   30 cm proximal to suprapatella   20 cm proximal to suprapatella   10 cm proximal to suprapatella   At midpatella / popliteal crease   30 cm proximal to floor at lateral plantar foot   20 cm proximal to floor at lateral plantar foot   10 cm proximal to floor at lateral plantar foot   Circumference of ankle/heel   5 cm proximal to 1st MTP joint   Across MTP joint   Around proximal great toe   (Blank rows = not tested)  FUNCTIONAL TESTS:  {Functional tests:24029}  GAIT: Distance walked: *** Assistive device utilized: {Assistive devices:23999} Level of assistance: {Levels of assistance:24026} Comments: ***  LYMPHEDEMA LIFE IMPACT SCALE (LLIS):  I. Physical Concerns The amount of pain associated with my lymphedema is: {LLISSELECTION:27229} The amount of limb heaviness associated with my lymphedema is: {LLISSELECTION:27229} The amount of skin tightness associated with my lymphedema is: {LLISSELECTION:27229} In comparison to my unaffected limb, the size of my swollen limb seems: {LLISSELECTION:27229} In comparison to my unaffected limb, the texture of my swollen limb feels:  {LLISSELECTION:27229} Lymphedema affects movement of my swollen limb: {LLISSELECTION:27229} The strength in my swollen limb compared with the unaffected limb is: {LLISSELECTION:27229} How often have you become ill wit an infection in your swollen limb requiring oral antibiotics or hospitalization in the past 2 YEARS? {LLISSELECTION:27229}  II. Psychosocial Concerns 9.   Lymphedema affects my body image (ie. How I think I look): {LLISSELECTION:27229} 10. Lymphedema affects my socializing with others: {LLISSELECTION:27229} 11. Lymphedema affects my intimate relations: {LLISSELECTION:27229} 12. Lymphedema gets me down (ie. I have feelings of depression, frustration, or anger due to the lymphedema): {LLISSELECTION:27229}  III. Functional  Concerns 13. Lymphedema affects my ability to perform duties at home: {LLISSELECTION:27229} 14. Lymphedema affects my ability to perform dueites at work (if applicable): {LLISSELECTION:27229} 15. Lymphedema affects my performance of preferred recreational activities: {LLISSELECTION:27229} 16. Lymphedema affects the proper fit of clothing/shoes: {LLISSELECTION:27229} 17. Lymphedema affects my sleep: {LLISSELECTION:27229} 18. I must rely on others for help due to my lymphedema: {LLISSELECTION:27229}                                                                                                                            TREATMENT DATE: ***   PATIENT EDUCATION:  Education details: *** Person educated: {Person educated:25204} Education method: {Education Method:25205} Education comprehension: {Education Comprehension:25206}  HOME EXERCISE PROGRAM: ***  ASSESSMENT:  CLINICAL IMPRESSION: Patient is a *** y.o. *** who was seen today for physical therapy evaluation and treatment for ***.   OBJECTIVE IMPAIRMENTS: {opptimpairments:25111}.   ACTIVITY LIMITATIONS: {activitylimitations:27494}  PARTICIPATION LIMITATIONS:  {participationrestrictions:25113}  PERSONAL FACTORS: {Personal factors:25162} are also affecting patient's functional outcome.   REHAB POTENTIAL: {rehabpotential:25112}  CLINICAL DECISION MAKING: {clinical decision making:25114}  EVALUATION COMPLEXITY: {Evaluation complexity:25115}   GOALS: Goals reviewed with patient? {yes/no:20286}  SHORT TERM GOALS: Target date: ***  *** Baseline: Goal status: INITIAL  2.  *** Baseline:  Goal status: INITIAL  3.  *** Baseline:  Goal status: INITIAL  4.  *** Baseline:  Goal status: INITIAL  5.  *** Baseline:  Goal status: INITIAL  6.  *** Baseline:  Goal status: INITIAL  LONG TERM GOALS: Target date: ***  *** Baseline:  Goal status: INITIAL  2.  *** Baseline:  Goal status: INITIAL  3.  *** Baseline:  Goal status: INITIAL  4.  *** Baseline:  Goal status: INITIAL  5.  *** Baseline:  Goal status: INITIAL  6.  *** Baseline:  Goal status: INITIAL   PLAN:  PT FREQUENCY: {rehab frequency:25116}  PT DURATION: {rehab duration:25117}  PLANNED INTERVENTIONS: {rehab planned interventions:25118::97110-Therapeutic exercises,97530- Therapeutic [id number]- Neuromuscular re-education,97535- Self Rjmz,02859- Manual therapy,Patient/Family education}  PLAN FOR NEXT SESSION: ***   Zebedee Dec, MS, OTR/L, CLT-LANA 09/30/24 2:52 PM

## 2024-10-01 ENCOUNTER — Ambulatory Visit: Admitting: Occupational Therapy

## 2024-10-02 ENCOUNTER — Telehealth: Payer: Self-pay

## 2024-10-02 ENCOUNTER — Ambulatory Visit

## 2024-10-02 VITALS — BP 128/70 | HR 57 | Temp 98.5°F | Wt 172.6 lb

## 2024-10-02 DIAGNOSIS — R600 Localized edema: Secondary | ICD-10-CM | POA: Diagnosis not present

## 2024-10-02 DIAGNOSIS — F32A Depression, unspecified: Secondary | ICD-10-CM | POA: Diagnosis not present

## 2024-10-02 DIAGNOSIS — G4700 Insomnia, unspecified: Secondary | ICD-10-CM

## 2024-10-02 MED ORDER — MIRTAZAPINE 15 MG PO TABS
15.0000 mg | ORAL_TABLET | Freq: Every day | ORAL | 3 refills | Status: AC
Start: 2024-10-02 — End: ?

## 2024-10-02 NOTE — Assessment & Plan Note (Signed)
 Chronic. Depression with mood changes after mirtazapine  reduction due to leg swelling. Increase mirtazapine  dosage from 7.5 mg once daily to 15 mg once daily. Watch for worsening lower leg edema, dizziness, urinary retention, risk of fall.  Orders:   mirtazapine  (REMERON ) 15 MG tablet; Take 1 tablet (15 mg total) by mouth at bedtime.

## 2024-10-02 NOTE — Assessment & Plan Note (Signed)
 Chronic lymphedema, currently improved. Recommend walking frequently every 2-3 hours, wearing compression socks, raising legs and continue follow up with vascular therapy. Monitor for worsening symptoms since we are increasing dose of Mirtazapine  from 7.5 mg to 15 mg once a day.

## 2024-10-02 NOTE — Progress Notes (Signed)
 Established Patient Office Visit   Subjective  Patient ID: Jessica Erickson, female    DOB: 02/25/1926  Age: 89 y.o. MRN: [id number]  Chief Complaint  Patient presents with   Leg Swelling         Discussed the use of AI scribe software for clinical note transcription with the patient, who gave verbal consent to proceed.  History of Present Illness Jessica Erickson is a 89 year old female who has extremely hard time hearing and communicated via writing presenting with a caregiver for chronic follow up.  Lymphedema improved compared to last office visit. She continues to follow up with vascular therapy for this. She complains about not using leg wraps at home anymore.    Previously treated with Mirtazapine  20 mg to help with her mood, sleep and appetite. Dose reduced previously due to concern of lower leg edema potentially worsening from use of Mirtazapine . Patient's son [name] reached out requesting dose adjustment from 7.5 mg daily now to taking Mirtazapine  20 mg daily.   He wants to increase the dose again due to these mood changes.  Declines other concern during today's visit.     ROS As per HPI    Objective:     BP 128/70 (BP Location: Left Arm, Patient Position: Sitting, Cuff Size: Normal)   Pulse (!) 57   Temp 98.5 F (36.9 C) (Oral)   Wt 172 lb 9.6 oz (78.3 kg)   LMP  (LMP Unknown)   SpO2 93%   BMI 27.03 kg/m      10/02/2024    2:29 PM 07/02/2024    3:01 PM 01/21/2024   11:39 AM  Depression screen PHQ 2/9  Decreased Interest 1 0 0  Down, Depressed, Hopeless 0 0 0  PHQ - 2 Score 1 0 0  Altered sleeping 2 0 0  Tired, decreased energy 1 0 0  Change in appetite 0 0 0  Feeling bad or failure about yourself  0 0 0  Trouble concentrating 0 0 0  Moving slowly or fidgety/restless 1 0 0  Suicidal thoughts 1 0 0  PHQ-9 Score 6 0 0  Difficult doing work/chores Not difficult at all Not difficult at all Not difficult at all      10/02/2024    2:30 PM 07/02/2024    3:02  PM 10/15/2023    3:56 PM 09/11/2023   11:11 AM  GAD 7 : Generalized Anxiety Score  Nervous, Anxious, on Edge 0 0 0 0  Control/stop worrying 0 0 0 0  Worry too much - different things 0 0 0 0  Trouble relaxing 0 0 0 0  Restless 0 0 0 0  Easily annoyed or irritable 0 0 0 0  Afraid - awful might happen 0 0 0 0  Total GAD 7 Score 0 0 0 0  Anxiety Difficulty Not difficult at all Not difficult at all Not difficult at all Somewhat difficult      10/02/2024    2:29 PM 07/02/2024    3:01 PM 01/21/2024   11:39 AM  Depression screen PHQ 2/9  Decreased Interest 1 0 0  Down, Depressed, Hopeless 0 0 0  PHQ - 2 Score 1 0 0  Altered sleeping 2 0 0  Tired, decreased energy 1 0 0  Change in appetite 0 0 0  Feeling bad or failure about yourself  0 0 0  Trouble concentrating 0 0 0  Moving slowly or fidgety/restless 1 0 0  Suicidal  thoughts 1 0 0  PHQ-9 Score 6 0 0  Difficult doing work/chores Not difficult at all Not difficult at all Not difficult at all      10/02/2024    2:30 PM 07/02/2024    3:02 PM 10/15/2023    3:56 PM 09/11/2023   11:11 AM  GAD 7 : Generalized Anxiety Score  Nervous, Anxious, on Edge 0 0 0 0  Control/stop worrying 0 0 0 0  Worry too much - different things 0 0 0 0  Trouble relaxing 0 0 0 0  Restless 0 0 0 0  Easily annoyed or irritable 0 0 0 0  Afraid - awful might happen 0 0 0 0  Total GAD 7 Score 0 0 0 0  Anxiety Difficulty Not difficult at all Not difficult at all Not difficult at all Somewhat difficult   SDOH Screenings   Food Insecurity: No Food Insecurity (10/01/2024)  Housing: Low Risk  (10/01/2024)  Transportation Needs: No Transportation Needs (10/01/2024)  Utilities: Not At Risk (01/21/2024)  Alcohol  Screen: Low Risk  (01/21/2024)  Depression (PHQ2-9): Medium Risk (10/02/2024)  Financial Resource Strain: Low Risk  (10/01/2024)  Physical Activity: Inactive (10/01/2024)  Social Connections: Socially Isolated (10/01/2024)  Stress: No Stress Concern  Present (10/01/2024)  Tobacco Use: Medium Risk (10/02/2024)  Health Literacy: Inadequate Health Literacy (01/21/2024)     Physical Exam Vitals reviewed: Communication via writing.  Constitutional:      Appearance: She is obese.  HENT:     Head: Normocephalic and atraumatic.     Mouth/Throat:     Mouth: Mucous membranes are moist.  Cardiovascular:     Rate and Rhythm: Normal rate.  Pulmonary:     Effort: Pulmonary effort is normal.     Breath sounds: Normal breath sounds.  Abdominal:     Palpations: Abdomen is soft.     Tenderness: There is no guarding.  Musculoskeletal:     Cervical back: Neck supple. No rigidity.     Right lower leg: Edema (2+ pitting edema) present.     Left lower leg: Edema (2+ pitting edema) present.  Skin:    General: Skin is warm.  Neurological:     Mental Status: She is oriented to person, place, and time.     Gait: Gait abnormal.     Comments: Uses a walker for ambulation   Psychiatric:        Mood and Affect: Mood normal.        No results found for any visits on 10/02/24.  The ASCVD Risk score (Arnett DK, et al., 2019) failed to calculate for the following reasons:   The 2019 ASCVD risk score is only valid for ages 18 to 86   Risk score cannot be calculated because patient has a medical history suggesting prior/existing ASCVD     Assessment & Plan:  Talked to patient and discussed below plan. Also updated her son [name] on the phone about assessment and plan from today. Assessment & Plan Depression, unspecified depression type Chronic. Depression with mood changes after mirtazapine  reduction due to leg swelling. Increase mirtazapine  dosage from 7.5 mg once daily to 15 mg once daily. Watch for worsening lower leg edema, dizziness, urinary retention, risk of fall.  Orders:   mirtazapine  (REMERON ) 15 MG tablet; Take 1 tablet (15 mg total) by mouth at bedtime.  Insomnia, unspecified type  Orders:   mirtazapine  (REMERON ) 15 MG tablet; Take  1 tablet (15 mg total) by mouth at bedtime.  Bilateral  leg edema Chronic lymphedema, currently improved. Recommend walking frequently every 2-3 hours, wearing compression socks, raising legs and continue follow up with vascular therapy. Monitor for worsening symptoms since we are increasing dose of Mirtazapine  from 7.5 mg to 15 mg once a day.     Return in about 3 months (around 01/02/2025) for Chronic.   Luke Shade, MD

## 2024-10-02 NOTE — Assessment & Plan Note (Signed)
 Orders:    mirtazapine  (REMERON ) 15 MG tablet; Take 1 tablet (15 mg total) by mouth at bedtime.

## 2024-10-02 NOTE — Telephone Encounter (Signed)
 Patient saw Dr. Kalpana Bair today and Dr. Abbey would like to see her again in three months.  Due to our schedule and patient's schedule, we scheduled her follow-up appointment on 01/27/2025.  I let patient know that I will send a message to Dr. Abbey so she will know since the appointment is almost four months out instead of three.

## 2024-10-04 ENCOUNTER — Ambulatory Visit

## 2024-10-07 ENCOUNTER — Ambulatory Visit

## 2024-10-07 ENCOUNTER — Other Ambulatory Visit: Payer: Self-pay

## 2024-10-07 ENCOUNTER — Emergency Department: Admission: EM | Admit: 2024-10-07 | Discharge: 2024-10-07 | Disposition: A | Discharge status: Home / Self Care

## 2024-10-07 ENCOUNTER — Ambulatory Visit: Admitting: Occupational Therapy

## 2024-10-07 ENCOUNTER — Emergency Department

## 2024-10-07 DIAGNOSIS — M5416 Radiculopathy, lumbar region: Secondary | ICD-10-CM | POA: Diagnosis not present

## 2024-10-07 DIAGNOSIS — M5126 Other intervertebral disc displacement, lumbar region: Secondary | ICD-10-CM | POA: Diagnosis not present

## 2024-10-07 DIAGNOSIS — M79604 Pain in right leg: Secondary | ICD-10-CM | POA: Insufficient documentation

## 2024-10-07 DIAGNOSIS — M1711 Unilateral primary osteoarthritis, right knee: Secondary | ICD-10-CM | POA: Diagnosis not present

## 2024-10-07 DIAGNOSIS — J449 Chronic obstructive pulmonary disease, unspecified: Secondary | ICD-10-CM | POA: Diagnosis not present

## 2024-10-07 DIAGNOSIS — Z7901 Long term (current) use of anticoagulants: Secondary | ICD-10-CM | POA: Insufficient documentation

## 2024-10-07 DIAGNOSIS — I10 Essential (primary) hypertension: Secondary | ICD-10-CM | POA: Diagnosis not present

## 2024-10-07 DIAGNOSIS — N189 Chronic kidney disease, unspecified: Secondary | ICD-10-CM | POA: Insufficient documentation

## 2024-10-07 DIAGNOSIS — M419 Scoliosis, unspecified: Secondary | ICD-10-CM | POA: Diagnosis not present

## 2024-10-07 DIAGNOSIS — Z8673 Personal history of transient ischemic attack (TIA), and cerebral infarction without residual deficits: Secondary | ICD-10-CM | POA: Insufficient documentation

## 2024-10-07 DIAGNOSIS — Z043 Encounter for examination and observation following other accident: Secondary | ICD-10-CM | POA: Diagnosis not present

## 2024-10-07 DIAGNOSIS — M4854XA Collapsed vertebra, not elsewhere classified, thoracic region, initial encounter for fracture: Secondary | ICD-10-CM | POA: Diagnosis not present

## 2024-10-07 LAB — CBC WITH DIFFERENTIAL/PLATELET
Abs Immature Granulocytes: 0.02 K/uL (ref 0.00–0.07)
Basophils Absolute: 0 K/uL (ref 0.0–0.1)
Basophils Relative: 1 %
Eosinophils Absolute: 0.2 K/uL (ref 0.0–0.5)
Eosinophils Relative: 4 %
HCT: 37.8 % (ref 36.0–46.0)
Hemoglobin: 11.6 g/dL — ABNORMAL LOW (ref 12.0–15.0)
Immature Granulocytes: 0 %
Lymphocytes Relative: 29 %
Lymphs Abs: 1.5 K/uL (ref 0.7–4.0)
MCH: 30.1 pg (ref 26.0–34.0)
MCHC: 30.7 g/dL (ref 30.0–36.0)
MCV: 97.9 fL (ref 80.0–100.0)
Monocytes Absolute: 0.5 K/uL (ref 0.1–1.0)
Monocytes Relative: 10 %
Neutro Abs: 3 K/uL (ref 1.7–7.7)
Neutrophils Relative %: 56 %
Platelets: 173 K/uL (ref 150–400)
RBC: 3.86 MIL/uL — ABNORMAL LOW (ref 3.87–5.11)
RDW: 13.4 % (ref 11.5–15.5)
WBC: 5.3 K/uL (ref 4.0–10.5)
nRBC: 0 % (ref 0.0–0.2)

## 2024-10-07 LAB — COMPREHENSIVE METABOLIC PANEL WITH GFR
ALT: 11 U/L (ref 0–44)
AST: 24 U/L (ref 15–41)
Albumin: 3.3 g/dL — ABNORMAL LOW (ref 3.5–5.0)
Alkaline Phosphatase: 50 U/L (ref 38–126)
Anion gap: 10 (ref 5–15)
BUN: 17 mg/dL (ref 8–23)
CO2: 37 mmol/L — ABNORMAL HIGH (ref 22–32)
Calcium: 8.9 mg/dL (ref 8.9–10.3)
Chloride: 96 mmol/L — ABNORMAL LOW (ref 98–111)
Creatinine, Ser: 1.29 mg/dL — ABNORMAL HIGH (ref 0.44–1.00)
GFR, Estimated: 38 mL/min — ABNORMAL LOW (ref >60)
Glucose, Bld: 115 mg/dL — ABNORMAL HIGH (ref 70–99)
Potassium: 4 mmol/L (ref 3.5–5.1)
Sodium: 143 mmol/L (ref 135–145)
Total Bilirubin: 0.8 mg/dL (ref 0.0–1.2)
Total Protein: 6.8 g/dL (ref 6.5–8.1)

## 2024-10-07 MED ORDER — LIDOCAINE 5 % EX PTCH
1.0000 | MEDICATED_PATCH | CUTANEOUS | Status: DC
  Administered 2024-10-07: 1 via TRANSDERMAL
  Filled 2024-10-07: qty 1

## 2024-10-07 MED ORDER — ACETAMINOPHEN 500 MG PO TABS: 1000.0000 mg | ORAL_TABLET | Freq: Four times a day (QID) | ORAL | 2 refills | Status: AC | PRN

## 2024-10-07 MED ORDER — LIDOCAINE 5 % EX PTCH: 1.0000 | MEDICATED_PATCH | CUTANEOUS | 0 refills | Status: AC

## 2024-10-07 MED ORDER — ACETAMINOPHEN 500 MG PO TABS
1000.0000 mg | ORAL_TABLET | Freq: Once | ORAL | Status: AC
  Administered 2024-10-07: 1000 mg via ORAL
  Filled 2024-10-07: qty 2

## 2024-10-07 NOTE — ED Triage Notes (Addendum)
 Patient arrives from home via [facility] with chief complaint of severe right leg pain.  Patient states she has prior history of DVT. Upon assessment patient has 2+ pedal pulses in both lower extremities. Some edema noted to both legs. The patient is also deaf, caretaker at bedside. Respirations even and unlabored.

## 2024-10-07 NOTE — Discharge Instructions (Signed)
 Your evaluation in the emergency department was overall reassuring.  I suspect you have a pinched nerve causing your pain, and this improved with treatment.  Please do follow-up with your primary care doctor for reevaluation, and return to the emergency department with any new or worsening symptoms.  I recommend you continue your gabapentin , you can also use Tylenol  and Lidoderm  patches as needed for any ongoing discomfort.

## 2024-10-07 NOTE — ED Notes (Signed)
 Called [facility] to initiate cardiac monitoring.

## 2024-10-07 NOTE — ED Provider Notes (Signed)
 [facility] Provider Note    Event Date/Time   First MD Initiated Contact with Patient 10/07/24 1035     (approximate)   History   Leg Pain  Patient arrives from home via [facility] with chief complaint of severe right leg pain.  Patient states she has prior history of DVT. Upon assessment patient has 2+ pedal pulses in both lower extremities. Some edema noted to both legs. The patient is also deaf, caretaker at bedside. Respirations even and unlabored.    HPI Jessica Erickson is a 89 y.o. female  pmh copd, arthritis, ckd, lymphedema, prior cva, DVT on eliquis  p/w R leg pain - pain RLE since weds (about 6 days), originally responsive to tylenol , felt a snap today her right hip after sitting in her wheelchair and pain worsened. Radiates from hip down to shin.  - Took Tylenol  earlier this morning with Aleve - No clear preceding trauma.  Accompanied by caregiver who provides collateral.  Patient is deaf and does respond to questions written on a white board. - Caregiver notes that patient usually uses a wheelchair in the morning but gradually progresses to using a walker throughout the day. - In addition to Tylenol , does take gabapentin  200 mg 4 times daily      Physical Exam   Triage Vital Signs: ED Triage Vitals  Encounter Vitals Group     BP 10/07/24 1030 (!) 149/119     Girls Systolic BP Percentile --      Girls Diastolic BP Percentile --      Boys Systolic BP Percentile --      Boys Diastolic BP Percentile --      Pulse Rate 10/07/24 1026 (!) 58     Resp 10/07/24 1026 16     Temp 10/07/24 1026 98.5 F (36.9 C)     Temp Source 10/07/24 1026 Oral     SpO2 10/07/24 1026 95 %     Weight 10/07/24 1030 171 lb (77.6 kg)     Height 10/07/24 1030 5' 7 (1.702 m)     Head Circumference --      Peak Flow --      Pain Score 10/07/24 1029 10     Pain Loc --      Pain Education --      Exclude from Growth Chart --     Most recent vital signs: Vitals:    10/07/24 1300 10/07/24 1400  BP: 122/73 (!) 126/49  Pulse: (!) 50 (!) 51  Resp: 20 20  Temp:    SpO2: 92% 97%     General: Awake, no distress.  CV:  Good peripheral perfusion. RRR, RP 2+ Resp:  Normal effort. CTAB Abd:  No distention. Nontender to deep palpation throughout RLE:  Moderate tenderness palpation of the right hip, mild tenderness over proximal right knee.  No limb length discrepancy nor abnormal rotation.  Pedal pulse 2+.  Positive straight leg raise with pain radiating from posterior lateral hip down to lateral distal thigh.  Leg compartments soft, no overlying bruising.   ED Results / Procedures / Treatments   Labs (all labs ordered are listed, but only abnormal results are displayed) Labs Reviewed  CBC WITH DIFFERENTIAL/PLATELET - Abnormal; Notable for the following components:      Result Value   RBC 3.86 (*)    Hemoglobin 11.6 (*)    All other components within normal limits  COMPREHENSIVE METABOLIC PANEL WITH GFR - Abnormal; Notable for the following components:  Chloride 96 (*)    CO2 37 (*)    Glucose, Bld 115 (*)    Creatinine, Ser 1.29 (*)    Albumin 3.3 (*)    GFR, Estimated 38 (*)    All other components within normal limits     EKG  Ecg = sinus rhythm, rate 55, no gross ST elevation or depression, no significant repolarization abnormality, left axis deviation.  No clear evidence of ischemia and arrhythmia my read.   RADIOLOGY Radiology interpreted by myself and radiology report reviewed.  No acute pathology identified.    PROCEDURES:  Critical Care performed: No  Procedures   MEDICATIONS ORDERED IN ED: Medications  lidocaine  (LIDODERM ) 5 % 1 patch (1 patch Transdermal Patch Applied 10/07/24 1110)  acetaminophen  (TYLENOL ) tablet 1,000 mg (1,000 mg Oral Given 10/07/24 1110)     IMPRESSION / MDM / ASSESSMENT AND PLAN / ED COURSE  I reviewed the triage vital signs and the nursing notes.                               DDX/MDM/AP: Differential diagnosis includes, but is not limited to, sciatica, consider unlikely fracture or dislocation, no evidence of ischemic etiology at this time with reassuring exam, consider muscle strain, IT band syndrome.  Plan: - Labs  - Tylenol , Lidoderm  patch - X-ray right hip, knee, L-spine - Reassess  Patient's presentation is most consistent with acute presentation with potential threat to life or bodily function.  The patient is on the cardiac monitor to evaluate for evidence of arrhythmia and/or significant heart rate changes.  ED course below.  Feeling much better after Tylenol , Lidoderm  patches.  NSAIDs contraindicated given anticoagulation.  Able to ambulate here.  Presentation overall most consistent with likely sciatica, imaging negative.  Stable for outpatient follow-up.  Rx Tylenol , Lidoderm  patches.  Plan for PMD follow-up.  ED return precautions in place.  Patient and caregiver agree with plan.  Clinical Course as of 10/07/24 1623  Tue Oct 07, 2024  1118 CBC with no leukocytosis, stable anemia [MM]  1309 Cmp at baseline [MM]  1309 XR femur: IMPRESSION: 1. No acute fracture or dislocation. 2. Moderate right knee osteoarthritis. 3. Bony demineralization.   [MM]  1321 XR Lumbar:  IMPRESSION: 1. No acute fracture observed. 2. Remote T12 compression fracture with vertebral augmentation and approximately 50% vertebral body height loss. 3. Grade 1 degenerative retrolisthesis at L1-2, L2-3, and L3-4, and grade 1 degenerative anterolisthesis at L4-5. 4. Loss of disc height and vacuum disc phenomenon from L1 through L4 with degenerative endplate sclerosis most notable at L1-2. 5. Minimal levoconvex lumbar scoliosis. 6. Prominence of stool throughout the colon, favoring constipation. 7. Atherosclerosis.   [MM]  1321 XR pelvis: IMPRESSION: 1. No evidence of acute traumatic injury. 2. Mild degenerative spurring of the right femoral head.   [MM]  1341  Pain notably improved on reevaluation, able to ambulate without difficulty  No evidence of acute pathology at this time, suspect sciatica as etiology of presentation  Will Rx Tylenol  and Lidoderm  patches and plan to continue gabapentin .  Plan for PMD follow-up.  Patient and caretaker agree with plan. [MM]    Clinical Course User Index [MM] Clarine Ozell LABOR, MD     FINAL CLINICAL IMPRESSION(S) / ED DIAGNOSES   Final diagnoses:  Right leg pain  Lumbar radiculopathy     Rx / DC Orders   ED Discharge Orders  Ordered    lidocaine  (LIDODERM ) 5 %  Every 24 hours        10/07/24 1342    acetaminophen  (TYLENOL ) 500 MG tablet  Every 6 hours PRN        10/07/24 1342             Note:  This document was prepared using Dragon voice recognition software and may include unintentional dictation errors.   Clarine Ozell LABOR, MD 10/07/24 [phone]

## 2024-10-07 NOTE — ED Notes (Signed)
 Caretaker states that patient said she heard snap in right leg when getting to the recliner this morning. Patient is normally able to ambulate with walker, however states she cannot bare any weight on leg since injury this morning.

## 2024-10-07 NOTE — ED Notes (Signed)
 X-ray at bedside.

## 2024-10-09 ENCOUNTER — Ambulatory Visit: Admitting: Occupational Therapy

## 2024-10-13 ENCOUNTER — Ambulatory Visit: Admitting: Cardiovascular Disease

## 2024-10-14 ENCOUNTER — Ambulatory Visit: Admitting: Occupational Therapy

## 2024-10-15 ENCOUNTER — Ambulatory Visit: Admitting: Occupational Therapy

## 2024-10-16 ENCOUNTER — Ambulatory Visit: Admitting: Podiatry

## 2024-10-16 DIAGNOSIS — Z91199 Patient's noncompliance with other medical treatment and regimen due to unspecified reason: Secondary | ICD-10-CM

## 2024-10-16 NOTE — Progress Notes (Signed)
 No show for appointment

## 2024-10-20 NOTE — Progress Notes (Deleted)
 Cardiology Office Note  Date:  10/20/2024   ID:  Jessica Erickson, DOB 31-Jul-1926, MRN [id number]  PCP:  Abbey Bruckner, MD   No chief complaint on file.   HPI:  Jessica Erickson is a 89 y.o. female with past medical history of: Past Medical History:  Diagnosis Date   Acoustic neuroma (HCC)    Allergy    Asthma    Bilateral swelling of feet    and legs   Bladder infection    CAD (coronary artery disease)    Cataract    Change in voice    Compression fracture of body of thoracic vertebra (HCC)    T12 09/18/15 MRI s/p fall    Constipation    COPD (chronic obstructive pulmonary disease) (HCC)    previous CXR with chronic interstitial lung dz    CVA (cerebral vascular accident) (HCC)    Depression    Diabetes (HCC)    with neuropathy   Diabetes mellitus, type 2 (HCC)    Diarrhea    Double vision    DVT (deep venous thrombosis) (HCC)    right leg 10/2015 was on coumadin  off as of 2017/2018 ; s/p IVC filter   Enuresis    Eye pain, right    Fall    Fatty liver    09/15/15 also mildly dilated pancreatitic duct rec MRCP small sub cm cyst hemangioma speeln mild right hydronephrorossi and prox. hydroureter, kidney stones, mild scarring kidneys   Female stress incontinence    Flank pain    GERD (gastroesophageal reflux disease)    with small hiatal hernia    Hard of hearing    Heart disease    History of kidney problems    Hyperlipidemia    mixed   Hypertension    Hypothyroidism, postsurgical    Impaired mobility and ADLs    uses rolling walker has caretaker 24/7 at home   Leg edema    Mixed incontinence urge and stress (female)(female)    Neuropathy    Osteoarthritis    DDD spine    Osteoporosis with fracture    T12 compression fracture   Photophobia    Pulmonary embolism (HCC)    10/2015 off coumadin  as of 04/2016   Pulmonary HTN (HCC)    mild pulm HTN, echo 10/09/15 EF 55-60%grade 1 dd, RV systolic pressure increased    Recurrent UTI    Sinus pressure    Skin cancer     BCC jawline and scalp    Thyroid  disease    follows [facility]   TIA (transient ischemic attack)    MRI 2009/2010 neg stroke    Trigeminal neuralgia    Dr. Nancye Meeter s/p gamma knife x 2, on Tegretol  since 2011/2012 no increase in dose >200 mg bid rec per family per neurology    Urinary body odor 08/05/2024   Urinary frequency    Urinary, incontinence, stress female    Dr Penne urology    Urination frequency 08/19/2023   Vaso vagal episode 04/02/2023  Who presents by referral from Dr. Abbey for chronic diastolic CHF  History of lymphedema, COPD, stage IV chronic kidney disease  Echo December 2023 EF 60% normal RV size and function,  Mildly elevated right heart pressure  In the ER August 02, 2024 leg swelling   PMH:   has a past medical history of Acoustic neuroma (HCC), Allergy, Asthma, Bilateral swelling of feet, Bladder infection, CAD (coronary artery disease), Cataract, Change in voice, Compression fracture of  body of thoracic vertebra (HCC), Constipation, COPD (chronic obstructive pulmonary disease) (HCC), CVA (cerebral vascular accident) (HCC), Depression, Diabetes (HCC), Diabetes mellitus, type 2 (HCC), Diarrhea, Double vision, DVT (deep venous thrombosis) (HCC), Enuresis, Eye pain, right, Fall, Fatty liver, Female stress incontinence, Flank pain, GERD (gastroesophageal reflux disease), Hard of hearing, Heart disease, History of kidney problems, Hyperlipidemia, Hypertension, Hypothyroidism, postsurgical, Impaired mobility and ADLs, Leg edema, Mixed incontinence urge and stress (female)(female), Neuropathy, Osteoarthritis, Osteoporosis with fracture, Photophobia, Pulmonary embolism (HCC), Pulmonary HTN (HCC), Recurrent UTI, Sinus pressure, Skin cancer, Thyroid  disease, TIA (transient ischemic attack), Trigeminal neuralgia, Urinary body odor (08/05/2024), Urinary frequency, Urinary, incontinence, stress female, Urination frequency (08/19/2023), and Vaso vagal episode  (04/02/2023).   PSH:    Past Surgical History:  Procedure Laterality Date   APPENDECTOMY     as a child, open   BRAIN SURGERY     schwnnoma removal 1996    brain tumor surgery     BREAST SURGERY     breast bx   CATARACT EXTRACTION     CHOLECYSTECTOMY     EYE SURGERY     cataract   IVC FILTER PLACEMENT ([facility] HX)     Dr. Marea 10/2015    LAPAROSCOPIC TUBAL LIGATION     MOHS SURGERY     scalp 04/2014    PERIPHERAL VASCULAR CATHETERIZATION N/A 10/11/2015   Procedure: IVC Filter Insertion;  Surgeon: Selinda GORMAN Marea, MD;  Location: [facility] INVASIVE CV [facility];  Service: Cardiovascular;  Laterality: N/A;   PERIPHERAL VASCULAR THROMBECTOMY Bilateral 03/29/2018   Procedure: PERIPHERAL VASCULAR THROMBECTOMY;  Surgeon: Marea Selinda GORMAN, MD;  Location: [facility] INVASIVE CV LAB;  Service: Cardiovascular;  Laterality: Bilateral;   PUBOVAGINAL SLING     THROAT SURGERY     THYROID  SURGERY     tumor around vocal cords    TOOTH EXTRACTION     winter 2018    TOTAL THYROIDECTOMY  1976    Current Outpatient Medications  Medication Sig Dispense Refill   ACCU-CHEK GUIDE TEST test strip USE TO TEST BLOOD SUGAR TWICE DAILY 200 each 1   Accu-Chek Softclix Lancets lancets Use as instructed 100 each 12   acetaminophen  (TYLENOL ) 325 MG tablet Take 650 mg by mouth 2 (two) times daily as needed for mild pain (pain score 1-3).     acetaminophen  (TYLENOL ) 500 MG tablet Take 2 tablets (1,000 mg total) by mouth every 6 (six) hours as needed. 100 tablet 2   apixaban  (ELIQUIS ) 2.5 MG TABS tablet Take 1 tablet (2.5 mg total) by mouth 2 (two) times daily. 180 tablet 3   arformoterol  (BROVANA ) 15 MCG/2ML NEBU Take 2 mLs (15 mcg total) by nebulization 2 (two) times daily. 360 mL 6   atorvastatin  (LIPITOR) 40 MG tablet Take 1 tablet (40 mg total) by mouth daily. 90 tablet 3   bisacodyl  (DULCOLAX) 5 MG EC tablet Take 5 mg by mouth daily as needed for moderate constipation.     Blood Glucose Monitoring Suppl (ACCU-CHEK AVIVA PLUS)  w/Device KIT Use as directed 1 kit 0   budesonide  (PULMICORT ) 0.25 MG/2ML nebulizer solution Take 2 mLs (0.25 mg total) by nebulization 2 (two) times daily. 360 mL 3   calcium -vitamin D  (OSCAL WITH D) 500-5 MG-MCG tablet Take 1 tablet by mouth 2 (two) times daily. Unable to find former dose 600ca/400 vitamin D  inform patient dose change Oscal 180 tablet 3   cetirizine  (ZYRTEC ) 10 MG tablet TAKE 1 TABLET BY MOUTH DAILY. 90 tablet 3   docusate sodium  (  COLACE) 100 MG capsule Take 100 mg by mouth 2 (two) times daily.     feeding supplement, GLUCERNA SHAKE, (GLUCERNA SHAKE) LIQD Take 237 mLs by mouth 2 (two) times daily between meals.  0   furosemide  (LASIX ) 20 MG tablet Take 1 tablet (20 mg total) by mouth daily. 7 tablet 0   furosemide  (LASIX ) 40 MG tablet Take 1 tablet (40 mg total) by mouth daily. 120 tablet 3   gabapentin  (NEURONTIN ) 100 MG capsule TAKE 2 CAPSULES BY MOUTH 3 TIMES DAILY. 504 capsule 3   guaiFENesin  (MUCINEX ) 600 MG 12 hr tablet Take 2 tablets (1,200 mg total) by mouth 2 (two) times daily as needed for cough or to loosen phlegm. (Patient taking differently: Take 600 mg by mouth 2 (two) times daily as needed for cough or to loosen phlegm.)     ipratropium-albuterol  (DUONEB) 0.5-2.5 (3) MG/3ML SOLN TAKE 3 ML BY NEBULIZATION EVERY 4 HOURS AS NEEDED 360 mL 11   levothyroxine  (SYNTHROID ) 150 MCG tablet Take 1 tablet (150 mcg total) by mouth daily before breakfast. 90 tablet 1   lidocaine  (XYLOCAINE ) 5 % ointment Apply 1 Application topically 2 (two) times daily as needed. 35.44 g 0   Melatonin 10 MG TABS Take 10 mg by mouth at bedtime. 90 tablet 3   mirtazapine  (REMERON ) 15 MG tablet Take 1 tablet (15 mg total) by mouth at bedtime. 90 tablet 3   Multiple Vitamin (MULTIVITAMIN WITH MINERALS) TABS tablet Take 1 tablet by mouth daily.      OXYGEN  Inhale 2 L/min into the lungs continuous.     pantoprazole  (PROTONIX ) 40 MG tablet Take 1 tablet (40 mg total) by mouth daily. 90 tablet 3    polyethylene glycol powder (GLYCOLAX /MIRALAX ) 17 GM/SCOOP powder Take 17 g by mouth daily as needed. 1530 g 1   polyvinyl alcohol  (LIQUIFILM TEARS) 1.4 % ophthalmic solution Place 1 drop into both eyes at bedtime. 15 mL 0   revefenacin  (YUPELRI ) 175 MCG/3ML nebulizer solution Take 3 mLs (175 mcg total) by nebulization daily. 84 mL 0   revefenacin  (YUPELRI ) 175 MCG/3ML nebulizer solution Take 3 mLs (175 mcg total) by nebulization daily. 270 mL 3   No current facility-administered medications for this visit.     Allergies:   Penicillins, Sulfa antibiotics, Amitiza [lubiprostone], Aspirin , and Penicillin g   Social History:  The patient  reports that she quit smoking about 29 years ago. Her smoking use included cigarettes. She started smoking about 49 years ago. She has a 10 pack-year smoking history. She has never used smokeless tobacco. She reports that she does not drink alcohol  and does not use drugs.   Family History:   family history includes Cancer in her daughter; Diabetes in her father; Heart disease in her mother.    Review of Systems: ROS   PHYSICAL EXAM: VS:  LMP  (LMP Unknown)  , BMI There is no height or weight on file to calculate BMI. GEN: Well nourished, well developed, in no acute distress HEENT: normal Neck: no JVD, carotid bruits, or masses Cardiac: RRR; no murmurs, rubs, or gallops,no edema  Respiratory:  clear to auscultation bilaterally, normal work of breathing GI: soft, nontender, nondistended, + BS MS: no deformity or atrophy Skin: warm and dry, no rash Neuro:  Strength and sensation are intact Psych: euthymic mood, full affect    Recent Labs: 07/02/2024: TSH 0.70 10/07/2024: ALT 11; BUN 17; Creatinine, Ser 1.29; Hemoglobin 11.6; Platelets 173; Potassium 4.0; Sodium 143    Lipid  Panel Lab Results  Component Value Date   CHOL 129 07/02/2024   HDL 53.50 07/02/2024   LDLCALC 58 07/02/2024   TRIG 85.0 07/02/2024      Wt Readings from Last 3  Encounters:  10/07/24 171 lb (77.6 kg)  10/02/24 172 lb 9.6 oz (78.3 kg)  08/05/24 175 lb 3.2 oz (79.5 kg)       ASSESSMENT AND PLAN:  Problem List Items Addressed This Visit   None    Disposition:   F/U  12 months   Total encounter time more than 30 minutes  Greater than 50% was spent in counseling and coordination of care with the patient    Signed, Velinda Lunger, M.D., Ph.D. [facility] [place], [place] [phone]

## 2024-10-21 ENCOUNTER — Ambulatory Visit: Admitting: Cardiovascular Disease

## 2024-10-22 ENCOUNTER — Ambulatory Visit: Admitting: Occupational Therapy

## 2024-10-24 ENCOUNTER — Ambulatory Visit: Admitting: Occupational Therapy

## 2024-10-28 ENCOUNTER — Ambulatory Visit: Admitting: Occupational Therapy

## 2024-11-03 ENCOUNTER — Ambulatory Visit

## 2024-11-04 ENCOUNTER — Ambulatory Visit: Admitting: Occupational Therapy

## 2024-11-07 ENCOUNTER — Ambulatory Visit: Admitting: Occupational Therapy

## 2024-11-12 ENCOUNTER — Ambulatory Visit: Admitting: Occupational Therapy

## 2024-11-14 ENCOUNTER — Ambulatory Visit: Admitting: Occupational Therapy

## 2024-11-17 ENCOUNTER — Ambulatory Visit: Admitting: Occupational Therapy

## 2024-11-19 ENCOUNTER — Ambulatory Visit: Admitting: Occupational Therapy

## 2024-12-06 ENCOUNTER — Other Ambulatory Visit: Payer: Self-pay | Admitting: Internal Medicine

## 2024-12-20 ENCOUNTER — Other Ambulatory Visit: Payer: Self-pay

## 2024-12-20 DIAGNOSIS — J309 Allergic rhinitis, unspecified: Secondary | ICD-10-CM

## 2024-12-29 ENCOUNTER — Ambulatory Visit

## 2024-12-29 VITALS — Ht 67.0 in | Wt 171.0 lb

## 2024-12-29 DIAGNOSIS — Z9181 History of falling: Secondary | ICD-10-CM | POA: Diagnosis not present

## 2024-12-29 DIAGNOSIS — I890 Lymphedema, not elsewhere classified: Secondary | ICD-10-CM

## 2024-12-29 DIAGNOSIS — R600 Localized edema: Secondary | ICD-10-CM

## 2024-12-29 DIAGNOSIS — M1990 Unspecified osteoarthritis, unspecified site: Secondary | ICD-10-CM | POA: Diagnosis not present

## 2024-12-29 DIAGNOSIS — F32A Depression, unspecified: Secondary | ICD-10-CM

## 2024-12-29 DIAGNOSIS — R269 Unspecified abnormalities of gait and mobility: Secondary | ICD-10-CM

## 2024-12-29 MED ORDER — FUROSEMIDE 20 MG PO TABS: 20.0000 mg | ORAL_TABLET | Freq: Every day | ORAL | 3 refills | Status: AC

## 2024-12-29 NOTE — Patient Instructions (Addendum)
 1 M follow up with Dr. Abbey.  Home health referral made today.  Okay to give Lasix  20 mg at noon on top of 8 AM to help with lymphedema.  Follow up with vascular surgery, cardiology as well.

## 2024-12-29 NOTE — Assessment & Plan Note (Signed)
 Chronic, uses rollator for ambulation.  Likely multifactorial.  Home health referral for physical therapy to address fall risk as well as balance exercise was done today.

## 2024-12-29 NOTE — Assessment & Plan Note (Signed)
 Chronic hip pain possibly related to osteoarthritis.  Refer to home health for physical therapy for legs and hip.  Recommend office visit in 1 month or sooner if pain worsens.  Okay to take Tylenol  up to 2 g a day for pain.

## 2024-12-29 NOTE — Assessment & Plan Note (Signed)
 Plan for abnormal gait, arthritis from today's visit.

## 2024-12-29 NOTE — Progress Notes (Signed)
 "  Virtual Visit via Video Note  I connected with Jessica Erickson on 12/29/24 at  4:30 PM EST by a video enabled telemedicine application and verified that I am speaking with the correct person using two identifiers. She is with her son Jessica Erickson/DPR throughout the video visit.  Patient Location: Home Provider Location: Home Office  I discussed the limitations, risks, security, and privacy concerns of performing an evaluation and management service by video and the availability of in person appointments. I also discussed with the patient that there may be a patient responsible charge related to this service. The patient and her son Jessica Erickson expressed understanding and agreed to proceed.  Subjective: PCP: Jessica Bruckner, MD  Chief Complaint  Patient presents with   Medical Management of Chronic Issues   HPI [name] is a 89 year old female with bilateral lower legs lymphedema who presents via video visit with her son Jessica Erickson/DPR for intermittent worsening lower leg swelling and b/l hip pain. History obtained from Jessica Erickson due to patient's hard of hearing status.   She has been experiencing increased swelling in her legs, particularly from the hip down, which is more pronounced than her baseline. She has a history of lymphedema and has been taking Lasix  20 mg at 8 am with sometimes second dose of Lasix  20 mg around noon.   The swelling and pain in her legs have impacted her daily activities, leading her to cancel appointments, including a hair and dentist appointment. She has been using light wraps on her legs, although they may be too tight. Her son Jessica Erickson notes that she has started elevating her legs over the past two weeks.  She has a history of hip pain, which led to an ER visit in November where x-rays were performed but did not reveal any significant findings. Her hip and leg pain have contributed to mood swings and a reluctance to engage in activities. However, her mood has improved recently as her leg  pain has decreased, and she has resumed some activities like getting her hair done.  She has been taking mirtazapine ,15 mg at night time to help with mood.  She has also been taking extra Tylenol  for her leg pain, which her caregiver has advised her to reduce due to concerns about swelling.   She has a history of heart and kidney issues, which are relevant to her current symptoms. No fever or chills, and her appetite remains good, although she has been consuming more sweets, particularly oranges.  ROS: Per HPI Current Medications[1]  Observations/Objective: Today's Vitals   12/29/24 1636  Weight: 171 lb (77.6 kg)  Height: 5' 7 (1.702 m)   Physical Exam Constitutional:      General: She is not in acute distress.  She is sitting comfortably on recliner. Her son Jessica Erickson is present throughout the visit and she does not appear to be in active stress. Physical exam limited due to nature of video visit, patient's reduced hearing.    Assessment and Plan: Patient presenting via video visit with assistance from her son Jessica Erickson who is also her primary caregiver for lymphedema and mood follow-up.  She has been complaining of hip pain at home however she reports she is feeling better today.   Arthritis Assessment & Plan: Chronic hip pain possibly related to osteoarthritis.  Refer to home health for physical therapy for legs and hip.  Recommend office visit in 1 month or sooner if pain worsens.  Okay to take Tylenol  up to 2  g a day for pain.  Orders: -     Ambulatory referral to Home Health  Lymphedema Assessment & Plan: Chronic bilateral lower leg lymphedema.  Discussed need for lymphedema therapy to help with lower leg swelling.  Discussed differential diagnosis for worsening lower leg swelling including CHF exacerbation, worsening renal function.  Recommend reaching out to [facility] where patient is already an established patient for a follow-up visit and consider lymphedema therapy.   Patient is due for cardiology follow-up, recommend patient's son Jessica Erickson reaches out to cardiology clinic (Dr. Gollan) for follow-up for missed appointment in October 2025.  Continue Lasix  20 mg at 8 AM daily and second dose of Lasix  20 mg at noon as needed daily if worsening swelling. Continue daytime compression therapy.  Orders: -     Ambulatory referral to Home Health  Abnormal gait Assessment & Plan: Chronic, uses rollator for ambulation.  Likely multifactorial.  Home health referral for physical therapy to address fall risk as well as balance exercise was done today.  Orders: -     Ambulatory referral to Home Health  At high risk for falls Assessment & Plan: Plan for abnormal gait, arthritis from today's visit.  Orders: -     Ambulatory referral to Home Health  Bilateral leg edema -     Furosemide ; Take 1 tablet (20 mg total) by mouth daily.  Dispense: 90 tablet; Refill: 3  Depression, unspecified depression type Assessment & Plan: Chronic, currently on mirtazapine  15 mg nightly.  Discussed consideration for psychiatry referral.  Hesitant to increase dose of mirtazapine  given patient's age, comorbidities.  Also discussed potential referral to palliative care during today's visit.  Patient's son Jessica Erickson hitchcock declined this during today's visit.     Follow Up Instructions: Return in about 4 weeks (around 01/26/2025) for lymphedema, hip pain(needs 45 min appointment time .   I discussed the assessment and treatment plan with the patient and her son. The patient/son Jessica Erickson was provided an opportunity to ask questions, and all were answered. The patient agreed with the plan and demonstrated an understanding of the instructions.   The patient/son Jessica Erickson was advised to call back or seek an in-person evaluation if the symptoms worsen or if the condition fails to improve as anticipated.  The above assessment and management plan was discussed with the patient/son Job. The patient and her son Jessica Erickson  verbalized understanding of and has agreed to the management plan.   [name], MD     [1]  Current Outpatient Medications:    ACCU-CHEK GUIDE TEST test strip, USE TO TEST BLOOD SUGAR TWICE DAILY, Disp: 200 each, Rfl: 1   Accu-Chek Softclix Lancets lancets, Use as instructed, Disp: 100 each, Rfl: 12   acetaminophen  (TYLENOL ) 325 MG tablet, Take 650 mg by mouth 2 (two) times daily as needed for mild pain (pain score 1-3)., Disp: , Rfl:    acetaminophen  (TYLENOL ) 500 MG tablet, Take 2 tablets (1,000 mg total) by mouth every 6 (six) hours as needed., Disp: 100 tablet, Rfl: 2   apixaban  (ELIQUIS ) 2.5 MG TABS tablet, Take 1 tablet (2.5 mg total) by mouth 2 (two) times daily., Disp: 180 tablet, Rfl: 3   arformoterol  (BROVANA ) 15 MCG/2ML NEBU, Take 2 mLs (15 mcg total) by nebulization 2 (two) times daily., Disp: 360 mL, Rfl: 6   atorvastatin  (LIPITOR) 40 MG tablet, Take 1 tablet (40 mg total) by mouth daily., Disp: 90 tablet, Rfl: 3   bisacodyl  (DULCOLAX) 5 MG EC tablet,  Take 5 mg by mouth daily as needed for moderate constipation., Disp: , Rfl:    Blood Glucose Monitoring Suppl (ACCU-CHEK AVIVA PLUS) w/Device KIT, Use as directed, Disp: 1 kit, Rfl: 0   budesonide  (PULMICORT ) 0.25 MG/2ML nebulizer solution, Take 2 mLs (0.25 mg total) by nebulization 2 (two) times daily., Disp: 360 mL, Rfl: 3   calcium -vitamin D  (OSCAL WITH D) 500-5 MG-MCG tablet, Take 1 tablet by mouth 2 (two) times daily. Unable to find former dose 600ca/400 vitamin D  inform patient dose change O[place], Disp: 180 tablet, Rfl: 3   cetirizine  (ZYRTEC ) 10 MG tablet, TAKE 1 TABLET BY MOUTH DAILY., Disp: 90 tablet, Rfl: 3   docusate sodium  (COLACE) 100 MG capsule, Take 100 mg by mouth 2 (two) times daily., Disp: , Rfl:    feeding supplement, GLUCERNA SHAKE, (GLUCERNA SHAKE) LIQD, Take 237 mLs by mouth 2 (two) times daily between meals., Disp: , Rfl: 0   furosemide  (LASIX ) 20 MG tablet, Take 1 tablet (20 mg total) by mouth daily., Disp:  90 tablet, Rfl: 3   gabapentin  (NEURONTIN ) 100 MG capsule, TAKE 2 CAPSULES BY MOUTH 3 TIMES DAILY., Disp: 504 capsule, Rfl: 3   guaiFENesin  (MUCINEX ) 600 MG 12 hr tablet, Take 2 tablets (1,200 mg total) by mouth 2 (two) times daily as needed for cough or to loosen phlegm., Disp: , Rfl:    ipratropium-albuterol  (DUONEB) 0.5-2.5 (3) MG/3ML SOLN, TAKE 3 ML BY NEBULIZATION EVERY 4 HOURS AS NEEDED, Disp: 360 mL, Rfl: 11   levothyroxine  (SYNTHROID ) 150 MCG tablet, Take 1 tablet (150 mcg total) by mouth daily before breakfast., Disp: 90 tablet, Rfl: 1   lidocaine  (XYLOCAINE ) 5 % ointment, Apply 1 Application topically 2 (two) times daily as needed., Disp: 35.44 g, Rfl: 0   Melatonin 10 MG TABS, Take 10 mg by mouth at bedtime., Disp: 90 tablet, Rfl: 3   mirtazapine  (REMERON ) 15 MG tablet, Take 1 tablet (15 mg total) by mouth at bedtime., Disp: 90 tablet, Rfl: 3   Multiple Vitamin (MULTIVITAMIN WITH MINERALS) TABS tablet, Take 1 tablet by mouth daily. , Disp: , Rfl:    OXYGEN , Inhale 2 L/min into the lungs continuous., Disp: , Rfl:    pantoprazole  (PROTONIX ) 40 MG tablet, Take 1 tablet (40 mg total) by mouth daily., Disp: 90 tablet, Rfl: 3   polyethylene glycol powder (GLYCOLAX /MIRALAX ) 17 GM/SCOOP powder, Take 17 g by mouth daily as needed., Disp: 1530 g, Rfl: 1   polyvinyl alcohol  (LIQUIFILM TEARS) 1.4 % ophthalmic solution, Place 1 drop into both eyes at bedtime., Disp: 15 mL, Rfl: 0   revefenacin  (YUPELRI ) 175 MCG/3ML nebulizer solution, Take 3 mLs (175 mcg total) by nebulization daily., Disp: 270 mL, Rfl: 3  "

## 2024-12-29 NOTE — Assessment & Plan Note (Signed)
 Chronic, currently on mirtazapine  15 mg nightly.  Discussed consideration for psychiatry referral.  Hesitant to increase dose of mirtazapine  given patient's age, comorbidities.  Also discussed potential referral to palliative care during today's visit.  Patient's son Jessica Erickson declined this during today's visit.

## 2024-12-29 NOTE — Assessment & Plan Note (Signed)
 Chronic bilateral lower leg lymphedema.  Discussed need for lymphedema therapy to help with lower leg swelling.  Discussed differential diagnosis for worsening lower leg swelling including CHF exacerbation, worsening renal function.  Recommend reaching out to [facility] where patient is already an established patient for a follow-up visit and consider lymphedema therapy.  Patient is due for cardiology follow-up, recommend patient's son [name] reaches out to cardiology clinic (Dr. Gollan) for follow-up for missed appointment in October 2025.  Continue Lasix  20 mg at 8 AM daily and second dose of Lasix  20 mg at noon as needed daily if worsening swelling. Continue daytime compression therapy.

## 2025-01-13 ENCOUNTER — Ambulatory Visit: Admitting: Occupational Therapy

## 2025-01-20 ENCOUNTER — Ambulatory Visit: Admitting: Occupational Therapy

## 2025-01-22 ENCOUNTER — Ambulatory Visit: Admitting: Occupational Therapy

## 2025-01-23 ENCOUNTER — Ambulatory Visit: Payer: Medicare PPO

## 2025-01-27 ENCOUNTER — Ambulatory Visit

## 2025-01-27 ENCOUNTER — Ambulatory Visit: Admitting: Occupational Therapy

## 2025-02-09 ENCOUNTER — Ambulatory Visit: Admitting: Podiatry
